# Patient Record
Sex: Female | Born: 1940
Health system: Southern US, Community
[De-identification: ages and names within clinical notes are randomized; demographics above are authoritative.]

## PROBLEM LIST (undated history)

## (undated) DIAGNOSIS — I10 Essential (primary) hypertension: Secondary | ICD-10-CM

## (undated) DIAGNOSIS — D649 Anemia, unspecified: Secondary | ICD-10-CM

## (undated) DIAGNOSIS — C73 Malignant neoplasm of thyroid gland: Secondary | ICD-10-CM

## (undated) DIAGNOSIS — C9000 Multiple myeloma not having achieved remission: Secondary | ICD-10-CM

## (undated) DIAGNOSIS — C801 Malignant (primary) neoplasm, unspecified: Secondary | ICD-10-CM

## (undated) DIAGNOSIS — H409 Unspecified glaucoma: Secondary | ICD-10-CM

## (undated) DIAGNOSIS — E079 Disorder of thyroid, unspecified: Secondary | ICD-10-CM

## (undated) DIAGNOSIS — M949 Disorder of cartilage, unspecified: Secondary | ICD-10-CM

## (undated) DIAGNOSIS — Z5189 Encounter for other specified aftercare: Secondary | ICD-10-CM

## (undated) DIAGNOSIS — H269 Unspecified cataract: Secondary | ICD-10-CM

## (undated) DIAGNOSIS — M899 Disorder of bone, unspecified: Secondary | ICD-10-CM

## (undated) DIAGNOSIS — R7303 Prediabetes: Secondary | ICD-10-CM

## (undated) HISTORY — PX: EYE SURGERY: SHX253

## (undated) HISTORY — DX: Disorder of thyroid, unspecified: E07.9

## (undated) HISTORY — PX: BONE MARROW BIOPSY: SHX199

## (undated) HISTORY — DX: Essential (primary) hypertension: I10

## (undated) HISTORY — DX: Disorder of cartilage, unspecified: M94.9

## (undated) HISTORY — DX: Malignant neoplasm of thyroid gland: C73

## (undated) HISTORY — DX: Multiple myeloma not having achieved remission: C90.00

## (undated) HISTORY — DX: Unspecified cataract: H26.9

## (undated) HISTORY — DX: Encounter for other specified aftercare: Z51.89

## (undated) HISTORY — PX: TONSILLECTOMY: SUR1361

## (undated) HISTORY — PX: DILATION AND CURETTAGE OF UTERUS: SHX78

## (undated) HISTORY — DX: Disorder of bone, unspecified: M89.9

---

## 1988-10-05 HISTORY — PX: BREAST LUMPECTOMY: SHX2

## 1998-03-11 ENCOUNTER — Ambulatory Visit (HOSPITAL_COMMUNITY): Admission: RE | Admit: 1998-03-11 | Discharge: 1998-03-11 | Payer: Self-pay | Admitting: Obstetrics and Gynecology

## 1998-04-22 ENCOUNTER — Other Ambulatory Visit: Admission: RE | Admit: 1998-04-22 | Discharge: 1998-04-22 | Payer: Self-pay

## 1998-10-05 HISTORY — PX: BREAST EXCISIONAL BIOPSY: SUR124

## 1999-03-19 ENCOUNTER — Ambulatory Visit (HOSPITAL_COMMUNITY): Admission: RE | Admit: 1999-03-19 | Discharge: 1999-03-19 | Payer: Self-pay | Admitting: Obstetrics and Gynecology

## 1999-03-19 ENCOUNTER — Encounter: Payer: Self-pay | Admitting: Obstetrics and Gynecology

## 1999-04-01 ENCOUNTER — Other Ambulatory Visit: Admission: RE | Admit: 1999-04-01 | Discharge: 1999-04-01 | Payer: Self-pay | Admitting: Obstetrics and Gynecology

## 1999-05-30 ENCOUNTER — Other Ambulatory Visit: Admission: RE | Admit: 1999-05-30 | Discharge: 1999-05-30 | Payer: Self-pay

## 2000-04-12 ENCOUNTER — Ambulatory Visit (HOSPITAL_COMMUNITY): Admission: RE | Admit: 2000-04-12 | Discharge: 2000-04-12 | Payer: Self-pay | Admitting: Obstetrics and Gynecology

## 2000-04-12 ENCOUNTER — Encounter: Payer: Self-pay | Admitting: Obstetrics and Gynecology

## 2001-04-13 ENCOUNTER — Ambulatory Visit (HOSPITAL_COMMUNITY): Admission: RE | Admit: 2001-04-13 | Discharge: 2001-04-13 | Payer: Self-pay | Admitting: Obstetrics and Gynecology

## 2001-04-13 ENCOUNTER — Encounter: Payer: Self-pay | Admitting: Obstetrics and Gynecology

## 2002-04-14 ENCOUNTER — Ambulatory Visit (HOSPITAL_COMMUNITY): Admission: RE | Admit: 2002-04-14 | Discharge: 2002-04-14 | Payer: Self-pay | Admitting: Obstetrics and Gynecology

## 2002-04-14 ENCOUNTER — Encounter: Payer: Self-pay | Admitting: Obstetrics and Gynecology

## 2002-05-09 ENCOUNTER — Other Ambulatory Visit: Admission: RE | Admit: 2002-05-09 | Discharge: 2002-05-09 | Payer: Self-pay | Admitting: Obstetrics and Gynecology

## 2003-01-04 ENCOUNTER — Encounter: Payer: Self-pay | Admitting: Internal Medicine

## 2003-05-23 ENCOUNTER — Ambulatory Visit (HOSPITAL_COMMUNITY): Admission: RE | Admit: 2003-05-23 | Discharge: 2003-05-23 | Payer: Self-pay | Admitting: Obstetrics and Gynecology

## 2003-05-23 ENCOUNTER — Encounter: Payer: Self-pay | Admitting: Obstetrics and Gynecology

## 2003-11-27 ENCOUNTER — Emergency Department (HOSPITAL_COMMUNITY): Admission: EM | Admit: 2003-11-27 | Discharge: 2003-11-27 | Payer: Self-pay | Admitting: *Deleted

## 2003-12-26 ENCOUNTER — Encounter: Payer: Self-pay | Admitting: Internal Medicine

## 2004-01-01 ENCOUNTER — Encounter: Payer: Self-pay | Admitting: Internal Medicine

## 2004-03-28 ENCOUNTER — Encounter: Payer: Self-pay | Admitting: Internal Medicine

## 2004-05-16 ENCOUNTER — Encounter: Payer: Self-pay | Admitting: Internal Medicine

## 2004-05-21 ENCOUNTER — Other Ambulatory Visit: Admission: RE | Admit: 2004-05-21 | Discharge: 2004-05-21 | Payer: Self-pay | Admitting: Obstetrics and Gynecology

## 2004-05-29 ENCOUNTER — Ambulatory Visit (HOSPITAL_COMMUNITY): Admission: RE | Admit: 2004-05-29 | Discharge: 2004-05-29 | Payer: Self-pay | Admitting: Obstetrics and Gynecology

## 2004-09-02 ENCOUNTER — Ambulatory Visit: Payer: Self-pay | Admitting: Internal Medicine

## 2004-11-26 ENCOUNTER — Ambulatory Visit: Payer: Self-pay | Admitting: Internal Medicine

## 2004-12-02 ENCOUNTER — Ambulatory Visit: Payer: Self-pay | Admitting: Internal Medicine

## 2005-01-12 ENCOUNTER — Encounter: Admission: RE | Admit: 2005-01-12 | Discharge: 2005-01-12 | Payer: Self-pay | Admitting: Obstetrics and Gynecology

## 2006-03-04 ENCOUNTER — Ambulatory Visit (HOSPITAL_COMMUNITY): Admission: RE | Admit: 2006-03-04 | Discharge: 2006-03-04 | Payer: Self-pay | Admitting: Obstetrics and Gynecology

## 2006-03-30 ENCOUNTER — Ambulatory Visit: Payer: Self-pay | Admitting: Internal Medicine

## 2006-04-06 ENCOUNTER — Ambulatory Visit: Payer: Self-pay | Admitting: Internal Medicine

## 2006-05-04 ENCOUNTER — Ambulatory Visit: Payer: Self-pay | Admitting: Internal Medicine

## 2006-06-08 ENCOUNTER — Other Ambulatory Visit: Admission: RE | Admit: 2006-06-08 | Discharge: 2006-06-08 | Payer: Self-pay | Admitting: Obstetrics and Gynecology

## 2006-06-17 ENCOUNTER — Ambulatory Visit: Payer: Self-pay | Admitting: Internal Medicine

## 2007-03-11 DIAGNOSIS — M810 Age-related osteoporosis without current pathological fracture: Secondary | ICD-10-CM | POA: Insufficient documentation

## 2007-03-11 DIAGNOSIS — I10 Essential (primary) hypertension: Secondary | ICD-10-CM

## 2007-03-11 DIAGNOSIS — M899 Disorder of bone, unspecified: Secondary | ICD-10-CM

## 2007-03-11 DIAGNOSIS — M8580 Other specified disorders of bone density and structure, unspecified site: Secondary | ICD-10-CM

## 2007-03-11 HISTORY — DX: Disorder of bone, unspecified: M89.9

## 2007-03-11 HISTORY — DX: Essential (primary) hypertension: I10

## 2007-05-20 ENCOUNTER — Ambulatory Visit (HOSPITAL_COMMUNITY): Admission: RE | Admit: 2007-05-20 | Discharge: 2007-05-20 | Payer: Self-pay | Admitting: Obstetrics and Gynecology

## 2007-06-06 LAB — CONVERTED CEMR LAB: Pap Smear: NORMAL

## 2007-08-05 ENCOUNTER — Ambulatory Visit: Payer: Self-pay | Admitting: Internal Medicine

## 2007-08-05 LAB — CONVERTED CEMR LAB
ALT: 15 U/L
AST: 16 U/L
Albumin: 4.2 g/dL
Alkaline Phosphatase: 30 U/L — ABNORMAL LOW
Basophils Absolute: 0 K/uL
Basophils Relative: 0.7 %
Bilirubin Urine: NEGATIVE
Bilirubin, Direct: 0.2 mg/dL
Blood in Urine, dipstick: NEGATIVE
Cholesterol: 199 mg/dL
Eosinophils Absolute: 0 K/uL
Eosinophils Relative: 1.1 %
Glucose, Urine, Semiquant: NEGATIVE
HCT: 38.3 %
HDL: 47.4 mg/dL
Hemoglobin: 13 g/dL
Ketones, urine, test strip: NEGATIVE
LDL Cholesterol: 131 mg/dL — ABNORMAL HIGH
Lymphocytes Relative: 29.8 %
MCHC: 34 g/dL
MCV: 95.2 fL
Monocytes Absolute: 0.3 K/uL
Monocytes Relative: 6.8 %
Neutro Abs: 2.5 K/uL
Neutrophils Relative %: 61.6 %
Nitrite: NEGATIVE
Platelets: 262 K/uL
Protein, U semiquant: NEGATIVE
RBC: 4.03 M/uL
RDW: 13 %
Specific Gravity, Urine: 1.015
TSH: 1.34 u[IU]/mL
Total Bilirubin: 0.6 mg/dL
Total CHOL/HDL Ratio: 4.2
Total Protein: 7.5 g/dL
Triglycerides: 104 mg/dL
Urobilinogen, UA: 0.2
VLDL: 21 mg/dL
WBC Urine, dipstick: NEGATIVE
WBC: 4 10*3/microliter — ABNORMAL LOW
pH: 7.5

## 2007-08-08 ENCOUNTER — Telehealth (INDEPENDENT_AMBULATORY_CARE_PROVIDER_SITE_OTHER): Payer: Self-pay | Admitting: *Deleted

## 2007-08-16 ENCOUNTER — Ambulatory Visit: Payer: Self-pay | Admitting: Internal Medicine

## 2007-08-16 ENCOUNTER — Encounter: Payer: Self-pay | Admitting: Internal Medicine

## 2007-08-22 ENCOUNTER — Encounter: Payer: Self-pay | Admitting: Internal Medicine

## 2007-09-11 ENCOUNTER — Encounter: Payer: Self-pay | Admitting: Internal Medicine

## 2007-12-14 ENCOUNTER — Telehealth: Payer: Self-pay | Admitting: Internal Medicine

## 2007-12-15 ENCOUNTER — Ambulatory Visit: Payer: Self-pay | Admitting: Internal Medicine

## 2007-12-20 ENCOUNTER — Encounter: Payer: Self-pay | Admitting: Internal Medicine

## 2007-12-20 ENCOUNTER — Ambulatory Visit: Payer: Self-pay

## 2008-05-06 LAB — HM COLONOSCOPY

## 2008-05-30 ENCOUNTER — Ambulatory Visit (HOSPITAL_COMMUNITY): Admission: RE | Admit: 2008-05-30 | Discharge: 2008-05-30 | Payer: Self-pay | Admitting: Obstetrics and Gynecology

## 2008-06-19 ENCOUNTER — Other Ambulatory Visit: Admission: RE | Admit: 2008-06-19 | Discharge: 2008-06-19 | Payer: Self-pay | Admitting: Obstetrics and Gynecology

## 2008-07-14 ENCOUNTER — Encounter: Payer: Self-pay | Admitting: Internal Medicine

## 2008-07-17 ENCOUNTER — Ambulatory Visit: Payer: Self-pay | Admitting: Internal Medicine

## 2008-07-24 ENCOUNTER — Ambulatory Visit: Payer: Self-pay | Admitting: Internal Medicine

## 2008-08-07 ENCOUNTER — Encounter: Payer: Self-pay | Admitting: Internal Medicine

## 2008-08-07 ENCOUNTER — Ambulatory Visit: Payer: Self-pay | Admitting: Internal Medicine

## 2008-08-09 ENCOUNTER — Encounter: Payer: Self-pay | Admitting: Internal Medicine

## 2008-09-05 ENCOUNTER — Ambulatory Visit: Payer: Self-pay | Admitting: Internal Medicine

## 2008-09-05 LAB — CONVERTED CEMR LAB
ALT: 14 units/L (ref 0–35)
AST: 18 units/L (ref 0–37)
Basophils Absolute: 0 10*3/uL (ref 0.0–0.1)
Basophils Relative: 0.7 % (ref 0.0–3.0)
Bilirubin, Direct: 0.1 mg/dL (ref 0.0–0.3)
CO2: 31 meq/L (ref 19–32)
Creatinine, Ser: 0.8 mg/dL (ref 0.4–1.2)
Eosinophils Absolute: 0.1 10*3/uL (ref 0.0–0.7)
HDL: 59.7 mg/dL (ref >39.0)
Hemoglobin: 12.8 g/dL (ref 12.0–15.0)
Ketones, urine, test strip: NEGATIVE
Lymphocytes Relative: 39 % (ref 12.0–46.0)
MCV: 95.3 fL (ref 78.0–100.0)
Monocytes Relative: 10.6 % (ref 3.0–12.0)
Neutro Abs: 1.5 10*3/uL (ref 1.4–7.7)
Neutrophils Relative %: 48 % (ref 43.0–77.0)
Nitrite: NEGATIVE
Platelets: 187 10*3/uL (ref 150–400)
Protein, U semiquant: NEGATIVE
RBC: 3.89 M/uL (ref 3.87–5.11)
Sodium: 142 meq/L (ref 135–145)
Specific Gravity, Urine: 1.015
Total Protein: 7.5 g/dL (ref 6.0–8.3)
Urobilinogen, UA: 0.2
WBC Urine, dipstick: NEGATIVE
WBC: 3.1 10*3/uL — ABNORMAL LOW (ref 4.5–10.5)
pH: 8

## 2008-09-19 ENCOUNTER — Ambulatory Visit: Payer: Self-pay | Admitting: Internal Medicine

## 2008-09-26 ENCOUNTER — Ambulatory Visit: Payer: Self-pay | Admitting: Internal Medicine

## 2008-12-19 ENCOUNTER — Ambulatory Visit: Payer: Self-pay | Admitting: Internal Medicine

## 2008-12-24 ENCOUNTER — Ambulatory Visit: Payer: Self-pay | Admitting: Internal Medicine

## 2009-04-18 ENCOUNTER — Telehealth: Payer: Self-pay | Admitting: Internal Medicine

## 2009-05-15 ENCOUNTER — Telehealth: Payer: Self-pay | Admitting: Internal Medicine

## 2009-06-04 ENCOUNTER — Ambulatory Visit (HOSPITAL_COMMUNITY): Admission: RE | Admit: 2009-06-04 | Discharge: 2009-06-04 | Payer: Self-pay | Admitting: Obstetrics and Gynecology

## 2009-06-28 ENCOUNTER — Ambulatory Visit: Payer: Self-pay | Admitting: Internal Medicine

## 2009-07-12 ENCOUNTER — Telehealth: Payer: Self-pay | Admitting: Internal Medicine

## 2009-08-09 ENCOUNTER — Ambulatory Visit: Payer: Self-pay | Admitting: Internal Medicine

## 2010-02-03 ENCOUNTER — Ambulatory Visit: Payer: Self-pay | Admitting: Internal Medicine

## 2010-02-03 LAB — CONVERTED CEMR LAB
AST: 16 units/L (ref 0–37)
Basophils Relative: 1 % (ref 0.0–3.0)
Bilirubin, Direct: 0 mg/dL (ref 0.0–0.3)
Blood in Urine, dipstick: NEGATIVE
CO2: 32 meq/L (ref 19–32)
Chloride: 105 meq/L (ref 96–112)
Cholesterol: 192 mg/dL (ref 0–200)
Creatinine, Ser: 0.7 mg/dL (ref 0.4–1.2)
Eosinophils Relative: 1.8 % (ref 0.0–5.0)
Glucose, Urine, Semiquant: NEGATIVE
HCT: 37.4 % (ref 36.0–46.0)
HDL: 58.4 mg/dL (ref >39.00)
LDL Cholesterol: 115 mg/dL — ABNORMAL HIGH (ref 0–99)
Lymphs Abs: 1.1 10*3/uL (ref 0.7–4.0)
MCHC: 34 g/dL (ref 30.0–36.0)
Monocytes Absolute: 0.2 10*3/uL (ref 0.1–1.0)
Monocytes Relative: 8.2 % (ref 3.0–12.0)
Neutrophils Relative %: 53.2 % (ref 43.0–77.0)
Potassium: 4.1 meq/L (ref 3.5–5.1)
Protein, U semiquant: NEGATIVE
RBC: 3.89 M/uL (ref 3.87–5.11)
RDW: 14.4 % (ref 11.5–14.6)
Sodium: 142 meq/L (ref 135–145)
Specific Gravity, Urine: 1.015
TSH: 1.68 microintl units/mL (ref 0.35–5.50)
Total CHOL/HDL Ratio: 3
Triglycerides: 91 mg/dL (ref 0.0–149.0)
pH: 7

## 2010-02-10 ENCOUNTER — Ambulatory Visit: Payer: Self-pay | Admitting: Internal Medicine

## 2010-02-10 DIAGNOSIS — M79609 Pain in unspecified limb: Secondary | ICD-10-CM | POA: Insufficient documentation

## 2010-02-21 ENCOUNTER — Ambulatory Visit: Payer: Self-pay | Admitting: Internal Medicine

## 2010-02-21 LAB — CONVERTED CEMR LAB
OCCULT 1: NEGATIVE
OCCULT 2: NEGATIVE
OCCULT 3: NEGATIVE

## 2010-06-25 ENCOUNTER — Ambulatory Visit: Payer: Self-pay | Admitting: Internal Medicine

## 2010-07-09 ENCOUNTER — Ambulatory Visit (HOSPITAL_COMMUNITY): Admission: RE | Admit: 2010-07-09 | Discharge: 2010-07-09 | Payer: Self-pay | Admitting: Internal Medicine

## 2010-11-04 NOTE — Assessment & Plan Note (Signed)
Summary: cpx/njr/PT RESCD//CCM   Vital Signs:  Patient profile:   70 year old female Menstrual status:  postmenopausal Height:      64 inches Weight:      128 pounds BMI:     22.05 Pulse rate:   80 / minute Pulse rhythm:   regular Resp:     12 per minute BP sitting:   158 / 86  (left arm) Cuff size:   regular  Vitals Entered By: Gladis Riffle, RN (Feb 10, 2010 8:08 AM) CC: annual review, labs done--c/o leg cramps Is Patient Diabetic? No Comments BP 122/76-140/85 at home     Menstrual Status postmenopausal Last PAP Result Normal   CC:  annual review and labs done--c/o leg cramps.  History of Present Illness: CPX  new complaints:  glaucoma---followed by ophthalmology  GYN---her GYN retired  Leg cramps---3 weeks of intermittent nocturnal leg cramps---generally mild (she wonders whether could be related to eye drops)  concerned with skin lesion close to nose  Has constipation---ongoing for years without change---has had colonoscopy  pain bottom of right heel---better with inserts  All other systems reviewed and were negative   Preventive Screening-Counseling & Management  Alcohol-Tobacco     Smoking Status: quit > 6 months     Year Started: 1963     Year Quit: 1970  Current Problems (verified): 1)  Preventive Health Care  (ICD-V70.0) 2)  Osteopenia  (ICD-733.90) 3)  Hypertension  (ICD-401.9)  Current Medications (verified): 1)  Vitamin D 1000 Unit Tabs (Cholecalciferol) .... Once Daily 2)  Multivitamins   Tabs (Multiple Vitamin) .... Qd 3)  Fish Oil 1000 Mg Caps (Omega-3 Fatty Acids) .... Once Daily 4)  Grape Seed Extract 100 Mg Caps (Grape Seed) .... Once Daily 5)  Co Q-10 30 Mg Caps (Coenzyme Q10) .... Once Daily 6)  Amlodipine Besylate 5 Mg  Tabs (Amlodipine Besylate) .Marland Kitchen.. 1 By Mouth Every Day 7)  Lumigan 0.03 % Soln (Bimatoprost) .... Use Once Daily As Directed  Allergies: 1)  ! Ace Inhibitors 2)  Penicillin V Potassium (Penicillin V Potassium) 3)   Sulfamethoxazole (Sulfamethoxazole)  Past History:  Past Surgical History: Last updated: 03/11/2007 D & C X3 lip hematoma  Family History: Last updated: August 26, 2007 father deceased alcoholism mother deceased at 54 yo with CHF  Social History: Last updated: 08/26/07 Married Never Smoked Regular exercise-yes  Risk Factors: Exercise: yes (2007/08/26)  Risk Factors: Smoking Status: quit > 6 months (02/10/2010)  Past Medical History: Hypertension Osteopenia glaucoma  Physical Exam  General:  alert and well-developed.   Head:  normocephalic and atraumatic.   Eyes:  pupils equal and pupils round.   Ears:  R ear normal and L ear normal.   Nose:  nose piercing noted and no external erythema.   Neck:  No deformities, masses, or tenderness noted. Chest Wall:  No deformities, masses, or tenderness noted. Lungs:  Normal respiratory effort, chest expands symmetrically. Lungs are clear to auscultation, no crackles or wheezes. Heart:  normal rate and regular rhythm.   Abdomen:  Bowel sounds positive,abdomen soft and non-tender without masses, organomegaly or hernias noted. Msk:  No deformity or scoliosis noted of thoracic or lumbar spine.   Neurologic:  cranial nerves II-XII intact and gait normal.   Skin:  turgor normal and color normal.   Psych:  good eye contact and not anxious appearing.     Impression & Recommendations:  Problem # 1:  PREVENTIVE HEALTH CARE (ICD-V70.0)  health maint UTD  Orders: Hemoccult  Cards MCR Screening (G0107)  Problem # 2:  HYPERTENSION (ICD-401.9) home bps are good continue current medications  Her updated medication list for this problem includes:    Amlodipine Besylate 5 Mg Tabs (Amlodipine besylate) .Marland Kitchen... 1 by mouth every day  Problem # 3:  HEEL PAIN (ICD-729.5) ice exercise  Complete Medication List: 1)  Vitamin D 1000 Unit Tabs (Cholecalciferol) .... Once daily 2)  Multivitamins Tabs (Multiple vitamin) .... Qd 3)  Fish Oil  1000 Mg Caps (Omega-3 fatty acids) .... Once daily 4)  Grape Seed Extract 100 Mg Caps (Grape seed) .... Once daily 5)  Co Q-10 30 Mg Caps (Coenzyme q10) .... Once daily 6)  Amlodipine Besylate 5 Mg Tabs (Amlodipine besylate) .Marland Kitchen.. 1 by mouth every day 7)  Lumigan 0.03 % Soln (Bimatoprost) .... Use once daily as directed  Other Orders: TD Toxoids IM 7 YR + (07371) Admin 1st Vaccine (06269)   Patient Instructions: 1)  stool cards Prescriptions: AMLODIPINE BESYLATE 5 MG  TABS (AMLODIPINE BESYLATE) 1 by mouth every day  #90 x 3   Entered and Authorized by:   Birdie Sons MD   Signed by:   Birdie Sons MD on 02/10/2010   Method used:   Faxed to ...       [facility] (mail-order)             , [place]         Ph: [phone]       Fax: [phone]   RxID:   [id number]    Immunizations Administered:  Tetanus Vaccine:    Vaccine Type: Td    Site: left deltoid    Mfr: Sanofi Pasteur    Dose: 0.5 ml    Route: IM    Given by: Gladis Riffle, RN    Exp. Date: 08/20/2011    Lot #: [id number]   Prevention & Chronic Care Immunizations   Influenza vaccine: Fluvax 3+  (06/28/2009)   Influenza vaccine due: 07/17/2009    Tetanus booster: 02/10/2010: Td    Pneumococcal vaccine: Pneumovax  (04/04/2006)   Pneumococcal vaccine due: None    H. zoster vaccine: 09/26/2008: Zostavax  Colorectal Screening   Hemoccult: Not documented   Hemoccult action/deferral: Not indicated  (02/10/2010)    Colonoscopy: Normal  (05/16/2004)   Colonoscopy due: 05/16/2014  Other Screening   Pap smear: Normal  (06/06/2007)   Pap smear action/deferral: Not indicated-other  (02/10/2010)   Pap smear due: 06/05/2008    Mammogram: ASSESSMENT: Negative - BI-RADS 1^MM DIGITAL SCREENING  (06/04/2009)   Mammogram due: 05/05/2008    DXA bone density scan: abnormal  (08/16/2007)   DXA scan due: 08/2009    Smoking status: quit > 6 months  (02/10/2010)  Lipids   Total Cholesterol: 192  (02/03/2010)    LDL: 115  (02/03/2010)   LDL Direct: Not documented   HDL: 58.40  (02/03/2010)   Triglycerides: 91.0  (02/03/2010)  Hypertension   Last Blood Pressure: 158 / 86  (02/10/2010)   Serum creatinine: 0.7  (02/03/2010)   Serum potassium 4.1  (02/03/2010)    Hypertension flowsheet reviewed?: Yes   Progress toward BP goal: At goal   Hypertension comments: home BPs average 122/65---pt at goal  Self-Management Support :    Hypertension self-management support: Not documented

## 2010-11-04 NOTE — Procedures (Signed)
Summary: Colonoscopy Report  Colonoscopy Report   Imported By: Marylou Mccoy 02/10/2010 09:07:27  _____________________________________________________________________  External Attachment:    Type:   Image     Comment:   External Document

## 2010-11-04 NOTE — Assessment & Plan Note (Signed)
Summary: flu shot/cjr   Nurse Visit   Allergies: 1)  ! Ace Inhibitors 2)  Penicillin V Potassium (Penicillin V Potassium) 3)  Sulfamethoxazole (Sulfamethoxazole)  Orders Added: 1)  Admin 1st Vaccine [90471] 2)  Flu Vaccine 37yrs + [27062] Flu Vaccine Consent Questions     Do you have a history of severe allergic reactions to this vaccine? no    Any prior history of allergic reactions to egg and/or gelatin? no    Do you have a sensitivity to the preservative Thimersol? no    Do you have a past history of Guillan-Barre Syndrome? no    Do you currently have an acute febrile illness? no    Have you ever had a severe reaction to latex? no    Vaccine information given and explained to patient? yes    Are you currently pregnant? no    Lot Number:[id number]   Exp Date:04/04/2011   Site Given  Left Deltoid IM .lbflu

## 2011-01-05 ENCOUNTER — Encounter: Payer: Self-pay | Admitting: Family Medicine

## 2011-01-05 ENCOUNTER — Ambulatory Visit (INDEPENDENT_AMBULATORY_CARE_PROVIDER_SITE_OTHER): Payer: Medicare PPO | Admitting: Family Medicine

## 2011-01-05 DIAGNOSIS — H409 Unspecified glaucoma: Secondary | ICD-10-CM | POA: Insufficient documentation

## 2011-01-05 DIAGNOSIS — R059 Cough, unspecified: Secondary | ICD-10-CM

## 2011-01-05 DIAGNOSIS — R05 Cough: Secondary | ICD-10-CM

## 2011-01-05 DIAGNOSIS — J3489 Other specified disorders of nose and nasal sinuses: Secondary | ICD-10-CM

## 2011-01-05 MED ORDER — HYDROCODONE-HOMATROPINE 5-1.5 MG/5ML PO SYRP: 5.0000 mL | ORAL_SOLUTION | Freq: Four times a day (QID) | ORAL | Status: AC | PRN

## 2011-01-05 NOTE — Progress Notes (Signed)
  Subjective:    Patient ID: Megan Olson, female    DOB: 1941-05-01, 70 y.o.   MRN: [id number]  HPI Patient seen as a work in with symptoms that started about 6 days ago including nasal congestion, dry cough, sore throat, and postnasal drip symptoms. Temperature up to 100 yesterday. Patient is nonsmoker. No nausea, vomiting, or diarrhea. Recent diagnosis of glaucoma and started on timolol drops. Patient has not taken anything for cough. Cough is especially bothersome at night. Very poor sleep secondary to a dry cough. No dyspnea   Review of Systems  Constitutional: Positive for fatigue. Negative for activity change and appetite change.  HENT: Positive for congestion, sore throat, rhinorrhea and postnasal drip. Negative for ear pain and neck pain.   Respiratory: Positive for cough. Negative for shortness of breath.   Cardiovascular: Negative for chest pain, palpitations and leg swelling.       Objective:   Physical Exam  Constitutional: She appears well-developed and well-nourished.  HENT:  Head: Normocephalic.  Right Ear: External ear normal.  Left Ear: External ear normal.  Mouth/Throat: Oropharynx is clear and moist. No oropharyngeal exudate.  Eyes: Pupils are equal, round, and reactive to light. Right eye exhibits no discharge. Left eye exhibits no discharge.  Neck: Neck supple.  Cardiovascular: Normal rate, regular rhythm and normal heart sounds.   Pulmonary/Chest: Effort normal and breath sounds normal. She has no wheezes. She has no rales.  Musculoskeletal: She exhibits no edema.  Lymphadenopathy:    She has no cervical adenopathy.          Assessment & Plan:  Cough and nasal congestion. Differential is allergic versus viral. Try over-the-counter plain Allegra. Hycodan cough syrup as needed for nighttime cough

## 2011-01-05 NOTE — Patient Instructions (Signed)
Follow up promptly for any fever or worsening symptoms 

## 2011-02-17 ENCOUNTER — Other Ambulatory Visit (INDEPENDENT_AMBULATORY_CARE_PROVIDER_SITE_OTHER): Payer: Medicare PPO | Admitting: Internal Medicine

## 2011-02-17 DIAGNOSIS — I10 Essential (primary) hypertension: Secondary | ICD-10-CM

## 2011-02-17 DIAGNOSIS — Z0000 Encounter for general adult medical examination without abnormal findings: Secondary | ICD-10-CM

## 2011-02-17 DIAGNOSIS — Z79899 Other long term (current) drug therapy: Secondary | ICD-10-CM

## 2011-02-17 LAB — LIPID PANEL
HDL: 60.7 mg/dL (ref >39.00)
LDL Cholesterol: 112 mg/dL — ABNORMAL HIGH (ref 0–99)
Total CHOL/HDL Ratio: 3
Triglycerides: 74 mg/dL (ref 0.0–149.0)
VLDL: 14.8 mg/dL (ref 0.0–40.0)

## 2011-02-17 LAB — CBC WITH DIFFERENTIAL/PLATELET
Basophils Absolute: 0 10*3/uL (ref 0.0–0.1)
Eosinophils Relative: 1.4 % (ref 0.0–5.0)
Hemoglobin: 12.2 g/dL (ref 12.0–15.0)
Lymphocytes Relative: 35.4 % (ref 12.0–46.0)
Lymphs Abs: 1.3 10*3/uL (ref 0.7–4.0)
MCV: 95.2 fl (ref 78.0–100.0)
Monocytes Relative: 8.1 % (ref 3.0–12.0)
Neutrophils Relative %: 54.4 % (ref 43.0–77.0)
Platelets: 247 10*3/uL (ref 150.0–400.0)
RDW: 15.3 % — ABNORMAL HIGH (ref 11.5–14.6)

## 2011-02-17 LAB — POCT URINALYSIS DIPSTICK
Bilirubin, UA: NEGATIVE
Glucose, UA: NEGATIVE
Leukocytes, UA: NEGATIVE
Urobilinogen, UA: 0.2
pH, UA: 7.5

## 2011-02-17 LAB — HEPATIC FUNCTION PANEL
Bilirubin, Direct: 0 mg/dL (ref 0.0–0.3)
Total Bilirubin: 0.7 mg/dL (ref 0.3–1.2)
Total Protein: 6.8 g/dL (ref 6.0–8.3)

## 2011-02-17 LAB — BASIC METABOLIC PANEL
BUN: 16 mg/dL (ref 6–23)
Chloride: 104 mEq/L (ref 96–112)
Potassium: 5.3 mEq/L — ABNORMAL HIGH (ref 3.5–5.1)
Sodium: 140 mEq/L (ref 135–145)

## 2011-02-24 ENCOUNTER — Encounter: Payer: Self-pay | Admitting: Internal Medicine

## 2011-02-24 ENCOUNTER — Ambulatory Visit (INDEPENDENT_AMBULATORY_CARE_PROVIDER_SITE_OTHER): Payer: Medicare PPO | Admitting: Internal Medicine

## 2011-02-24 VITALS — BP 150/90 | HR 76 | Temp 98.4°F | Ht 64.0 in | Wt 127.0 lb

## 2011-02-24 DIAGNOSIS — Z0000 Encounter for general adult medical examination without abnormal findings: Secondary | ICD-10-CM

## 2011-02-24 MED ORDER — AMLODIPINE BESYLATE 5 MG PO TABS: 5.0000 mg | ORAL_TABLET | Freq: Every day | ORAL | Status: DC

## 2011-02-24 MED ORDER — BIMATOPROST 0.01 % OP SOLN: 1.0000 [drp] | Freq: Every day | OPHTHALMIC | Status: DC

## 2011-02-24 NOTE — Patient Instructions (Signed)
Schedule DEXA scan

## 2011-02-24 NOTE — Progress Notes (Signed)
  Subjective:    Patient ID: Megan Olson, female    DOB: [birth year], 70 y.o.   MRN: [id number]  HPI  Well visit  l knee pain---she thinks she over-flexed knee 6 months ago---still with pain with full flexion  URI sxs in march---reviewed dr burchette's note  Has questions about seborhheic keratosis.   GYN care---last pap 2-3 years---all previously normal  Glaucoma---she sees ophthalmology--she wants to take a supplement  Past Medical History  Diagnosis Date  . HYPERTENSION 03/11/2007  . OSTEOPENIA 03/11/2007   Past Surgical History  Procedure Date  . Dilation and curettage of uterus     reports that she quit smoking about 50 years ago. Her smoking use included Cigarettes. She has a 3.5 pack-year smoking history. She does not have any smokeless tobacco history on file. Her alcohol and drug histories not on file. family history includes Alcohol abuse in her father; Arthritis in her mother and sister; Cancer in her sister; Glaucoma in her mother; Heart disease in her mother and sister; Hyperlipidemia in her brother; and Hypertension in her brother and sister. Allergies  Allergen Reactions  . Ace Inhibitors   . Penicillins     REACTION: rash  . Sulfamethoxazole     REACTION: rash     Review of Systems  patient denies chest pain, shortness of breath, orthopnea. Denies lower extremity edema, abdominal pain, change in appetite, change in bowel movements. Patient denies rashes, musculoskeletal complaints. No other specific complaints in a complete review of systems.      Objective:   Physical Exam  Well-developed well-nourished female in no acute distress. HEENT exam atraumatic, normocephalic, extraocular muscles are intact. Neck is supple. No jugular venous distention no thyromegaly. Chest clear to auscultation without increased work of breathing. Cardiac exam S1 and S2 are regular. Abdominal exam active bowel sounds, soft, nontender. Extremities no edema. Neurologic exam she is alert  without any motor sensory deficits. Gait is normal. Skin tag---irritated on back     Assessment & Plan:  htn---well controlled at home :110-130/70s  Well vist---health maint UTD

## 2011-02-26 ENCOUNTER — Ambulatory Visit (INDEPENDENT_AMBULATORY_CARE_PROVIDER_SITE_OTHER)
Admission: RE | Admit: 2011-02-26 | Discharge: 2011-02-26 | Disposition: A | Discharge status: Home / Self Care | Payer: Medicare PPO | Source: Ambulatory Visit

## 2011-02-26 ENCOUNTER — Other Ambulatory Visit: Payer: Self-pay | Admitting: Internal Medicine

## 2011-02-26 DIAGNOSIS — M949 Disorder of cartilage, unspecified: Secondary | ICD-10-CM

## 2011-02-26 DIAGNOSIS — M899 Disorder of bone, unspecified: Secondary | ICD-10-CM

## 2011-03-20 ENCOUNTER — Encounter: Payer: Self-pay | Admitting: Internal Medicine

## 2011-03-27 ENCOUNTER — Telehealth: Payer: Self-pay | Admitting: *Deleted

## 2011-03-27 NOTE — Telephone Encounter (Signed)
Pt wanted to know how her bone density compared to the last time and when does it start with osteoporosis and ends with osteopenia

## 2011-03-27 NOTE — Telephone Encounter (Signed)
Pt aware.

## 2011-03-27 NOTE — Telephone Encounter (Signed)
It is okay to give her the T score is off of the bone density test. Osteopenia T score ranges between -1.0 and -2.5. Osteoporosis starts at -2.5 and below.

## 2011-07-15 ENCOUNTER — Other Ambulatory Visit: Payer: Self-pay | Admitting: Internal Medicine

## 2011-07-15 DIAGNOSIS — Z1231 Encounter for screening mammogram for malignant neoplasm of breast: Secondary | ICD-10-CM

## 2011-07-28 ENCOUNTER — Ambulatory Visit (HOSPITAL_COMMUNITY)
Admission: RE | Admit: 2011-07-28 | Discharge: 2011-07-28 | Disposition: A | Discharge status: Home / Self Care | Payer: Medicare PPO | Source: Ambulatory Visit | Attending: Internal Medicine | Admitting: Internal Medicine

## 2011-07-28 DIAGNOSIS — Z1231 Encounter for screening mammogram for malignant neoplasm of breast: Secondary | ICD-10-CM | POA: Insufficient documentation

## 2011-08-19 ENCOUNTER — Ambulatory Visit: Payer: Medicare PPO | Admitting: *Deleted

## 2011-08-20 ENCOUNTER — Ambulatory Visit: Payer: Medicare PPO | Admitting: *Deleted

## 2011-11-20 ENCOUNTER — Telehealth: Payer: Self-pay | Admitting: *Deleted

## 2011-11-20 MED ORDER — AMLODIPINE BESYLATE 5 MG PO TABS: 5.0000 mg | ORAL_TABLET | Freq: Every day | ORAL | Status: DC

## 2011-11-20 NOTE — Telephone Encounter (Signed)
Pt needs a new prescription for Amlodipine 5 mg for 90 days to send to Pt.  She is changing pharmacies to [facility] .   Please call pt when pres is ready to be picked up.

## 2012-01-27 ENCOUNTER — Other Ambulatory Visit: Payer: Self-pay | Admitting: Dermatology

## 2012-03-07 ENCOUNTER — Other Ambulatory Visit (INDEPENDENT_AMBULATORY_CARE_PROVIDER_SITE_OTHER): Payer: Medicare PPO

## 2012-03-07 DIAGNOSIS — Z0000 Encounter for general adult medical examination without abnormal findings: Secondary | ICD-10-CM

## 2012-03-07 DIAGNOSIS — I10 Essential (primary) hypertension: Secondary | ICD-10-CM

## 2012-03-07 LAB — CBC WITH DIFFERENTIAL/PLATELET
Basophils Absolute: 0 10*3/uL (ref 0.0–0.1)
Eosinophils Absolute: 0.1 10*3/uL (ref 0.0–0.7)
Lymphocytes Relative: 36.3 % (ref 12.0–46.0)
MCHC: 33 g/dL (ref 30.0–36.0)
Monocytes Relative: 10.7 % (ref 3.0–12.0)
Platelets: 209 10*3/uL (ref 150.0–400.0)
RDW: 14.4 % (ref 11.5–14.6)

## 2012-03-07 LAB — BASIC METABOLIC PANEL
BUN: 15 mg/dL (ref 6–23)
CO2: 29 mEq/L (ref 19–32)
Calcium: 9.1 mg/dL (ref 8.4–10.5)
Chloride: 102 mEq/L (ref 96–112)
Creatinine, Ser: 0.7 mg/dL (ref 0.4–1.2)
GFR: 82.22 mL/min (ref >60.00)
Glucose, Bld: 93 mg/dL (ref 70–99)
Potassium: 4 mEq/L (ref 3.5–5.1)
Sodium: 139 mEq/L (ref 135–145)

## 2012-03-07 LAB — POCT URINALYSIS DIPSTICK
Bilirubin, UA: NEGATIVE
Blood, UA: NEGATIVE
Ketones, UA: NEGATIVE
Leukocytes, UA: NEGATIVE
Protein, UA: NEGATIVE
pH, UA: 8.5

## 2012-03-07 LAB — LIPID PANEL
HDL: 64.2 mg/dL (ref >39.00)
Total CHOL/HDL Ratio: 3
Triglycerides: 88 mg/dL (ref 0.0–149.0)
VLDL: 17.6 mg/dL (ref 0.0–40.0)

## 2012-03-07 LAB — TSH: TSH: 1.9 u[IU]/mL (ref 0.35–5.50)

## 2012-03-07 LAB — HEPATIC FUNCTION PANEL
AST: 14 U/L (ref 0–37)
Alkaline Phosphatase: 45 U/L (ref 39–117)
Total Bilirubin: 0.7 mg/dL (ref 0.3–1.2)

## 2012-03-14 ENCOUNTER — Ambulatory Visit (INDEPENDENT_AMBULATORY_CARE_PROVIDER_SITE_OTHER): Payer: Medicare Other | Admitting: Internal Medicine

## 2012-03-14 ENCOUNTER — Encounter: Payer: Self-pay | Admitting: Internal Medicine

## 2012-03-14 VITALS — BP 182/88 | HR 82 | Temp 98.3°F | Ht 64.25 in | Wt 126.0 lb

## 2012-03-14 DIAGNOSIS — Z0000 Encounter for general adult medical examination without abnormal findings: Secondary | ICD-10-CM

## 2012-03-14 MED ORDER — AMLODIPINE BESYLATE 5 MG PO TABS: 5.0000 mg | ORAL_TABLET | Freq: Every day | ORAL | Status: DC

## 2012-03-14 NOTE — Progress Notes (Signed)
  cpx  home bps- 117-131/73-81  Has seen dermatology for "keratosis"- pruritis. Pruritis can be diffuse but is intermittent  Past Medical History  Diagnosis Date  . HYPERTENSION 03/11/2007  . OSTEOPENIA 03/11/2007    History   Social History  . Marital Status: Married    Spouse Name: N/A    Number of Children: N/A  . Years of Education: N/A   Occupational History  . Not on file.   Social History Main Topics  . Smoking status: Former Smoker -- 0.5 packs/day for 7 years    Types: Cigarettes    Quit date: 01/04/1961  . Smokeless tobacco: Not on file  . Alcohol Use: Not on file  . Drug Use: Not on file  . Sexually Active: Not on file   Other Topics Concern  . Not on file   Social History Narrative  . No narrative on file    Past Surgical History  Procedure Date  . Dilation and curettage of uterus     Family History  Problem Relation Age of Onset  . Heart disease Mother   . Arthritis Mother   . Glaucoma Mother   . Alcohol abuse Father   . Cancer Sister     lung cancer, smoker  . Heart disease Sister     aortic valve replacement  . Hyperlipidemia Brother   . Hypertension Brother   . Arthritis Sister   . Hypertension Sister     Allergies  Allergen Reactions  . Ace Inhibitors   . Penicillins     REACTION: rash  . Sulfamethoxazole     REACTION: rash    Current Outpatient Prescriptions on File Prior to Visit  Medication Sig Dispense Refill  . amLODipine (NORVASC) 5 MG tablet Take 1 tablet (5 mg total) by mouth daily.  90 tablet  1  . Bimatoprost (LUMIGAN) 0.01 % SOLN Apply 1 drop to eye at bedtime.      . Cholecalciferol (VITAMIN D3) 1000 UNITS CAPS Take by mouth daily.        Marland Kitchen co-enzyme Q-10 50 MG capsule Take 50 mg by mouth daily.        . fish oil-omega-3 fatty acids 1000 MG capsule Take 2 g by mouth daily.        . Magnesium Oxide 500 MG TABS Take 1 tablet by mouth daily.        . Multiple Vitamins-Minerals (MULTIVITAMIN WITH MINERALS) tablet Take 1  tablet by mouth daily.           patient denies chest pain, shortness of breath, orthopnea. Denies lower extremity edema, abdominal pain, change in appetite, change in bowel movements. Patient denies rashes, musculoskeletal complaints. No other specific complaints in a complete review of systems.   BP 182/88  Pulse 82  Temp(Src) 98.3 F (36.8 C) (Oral)  Ht 5' 4.25" (1.632 m)  Wt 126 lb (57.153 kg)  BMI 21.46 kg/m2  Well-developed well-nourished female in no acute distress. HEENT exam atraumatic, normocephalic, extraocular muscles are intact. Neck is supple. No jugular venous distention no thyromegaly. Chest clear to auscultation without increased work of breathing. Cardiac exam S1 and S2 are regular. Abdominal exam active bowel sounds, soft, nontender. Extremities no edema. Neurologic exam she is alert without any motor sensory deficits. Gait is normal.   A/P: well visit- health maint UTD Pruritis-- no rash-- she will try moisturizer.

## 2012-06-18 ENCOUNTER — Encounter (HOSPITAL_COMMUNITY): Payer: Self-pay | Admitting: Emergency Medicine

## 2012-06-18 ENCOUNTER — Emergency Department (HOSPITAL_COMMUNITY)
Admission: EM | Admit: 2012-06-18 | Discharge: 2012-06-18 | Disposition: A | Discharge status: Home / Self Care | Payer: Medicare Other | Source: Home / Self Care | Attending: Family Medicine | Admitting: Family Medicine

## 2012-06-18 DIAGNOSIS — S70361A Insect bite (nonvenomous), right thigh, initial encounter: Secondary | ICD-10-CM

## 2012-06-18 DIAGNOSIS — T148XXA Other injury of unspecified body region, initial encounter: Secondary | ICD-10-CM

## 2012-06-18 DIAGNOSIS — S90569A Insect bite (nonvenomous), unspecified ankle, initial encounter: Secondary | ICD-10-CM

## 2012-06-18 HISTORY — DX: Unspecified glaucoma: H40.9

## 2012-06-18 NOTE — ED Notes (Signed)
Pt c/o poss tick bite??? Noticed what she thought was a skin tag yesterday morning... She scraped it off yesterday and when awoke this am, she noticed a small round bruise... She denies: fevers, vomiting, nausea, diarrhea, pain or itching.

## 2012-06-18 NOTE — ED Provider Notes (Signed)
History     CSN: [id number]  Arrival date & time 06/18/12  1357   First MD Initiated Contact with Patient 06/18/12 1404      Chief Complaint  Patient presents with  . Insect Bite    (Consider location/radiation/quality/duration/timing/severity/associated sxs/prior treatment) Patient is a 71 y.o. female presenting with rash. The history is provided by the patient and the spouse.  Rash  This is a new problem. The current episode started yesterday. The problem has not changed since onset.The problem is associated with an insect bite/sting. There has been no fever. The rash is present on the right upper leg. The patient is experiencing no pain. Pertinent negatives include no itching and no pain.    Past Medical History  Diagnosis Date  . HYPERTENSION 03/11/2007  . OSTEOPENIA 03/11/2007  . Glaucoma     Past Surgical History  Procedure Date  . Dilation and curettage of uterus   . Breast lumpectomy 1990    benign  . Tonsillectomy     age 77    Family History  Problem Relation Age of Onset  . Heart disease Mother   . Arthritis Mother   . Glaucoma Mother   . Alcohol abuse Father   . Cancer Sister     lung cancer, smoker  . Heart disease Sister     aortic valve replacement  . Hyperlipidemia Brother   . Hypertension Brother   . Arthritis Sister   . Hypertension Sister     History  Substance Use Topics  . Smoking status: Former Smoker -- 0.5 packs/day for 7 years    Types: Cigarettes    Quit date: 01/04/1961  . Smokeless tobacco: Not on file  . Alcohol Use: Yes    OB History    Grav Para Term Preterm Abortions TAB SAB Ect Mult Living                  Review of Systems  Constitutional: Negative.   Skin: Positive for rash. Negative for itching.    Allergies  Ace inhibitors; Penicillins; and Sulfamethoxazole  Home Medications   Current Outpatient Rx  Name Route Sig Dispense Refill  . AMLODIPINE BESYLATE 5 MG PO TABS Oral Take 1 tablet (5 mg total) by mouth  daily. 90 tablet 3  . BIMATOPROST 0.01 % OP SOLN Ophthalmic Apply 1 drop to eye at bedtime.    Marland Kitchen VITAMIN D3 1000 UNITS PO CAPS Oral Take by mouth daily.      . OMEGA-3 FATTY ACIDS 1000 MG PO CAPS Oral Take 2 g by mouth daily.      Marland Kitchen CO-ENZYME Q-10 50 MG PO CAPS Oral Take 50 mg by mouth daily.      Marland Kitchen MAGNESIUM OXIDE 500 MG PO TABS Oral Take 1 tablet by mouth daily.      . MULTI-VITAMIN/MINERALS PO TABS Oral Take 1 tablet by mouth daily.        BP 188/83  Pulse 68  Temp 98.4 F (36.9 C) (Oral)  Resp 16  SpO2 98%  Physical Exam  Nursing note and vitals reviewed. Constitutional: She appears well-developed and well-nourished.  Skin: Skin is warm and dry. Rash noted.       ED Course  Procedures (including critical care time)  Labs Reviewed - No data to display No results found.   1. Insect bite of right thigh       MDM          Linna Hoff, MD 06/18/12 1610

## 2012-06-29 ENCOUNTER — Other Ambulatory Visit (HOSPITAL_COMMUNITY): Payer: Self-pay | Admitting: Obstetrics and Gynecology

## 2012-06-29 DIAGNOSIS — Z1231 Encounter for screening mammogram for malignant neoplasm of breast: Secondary | ICD-10-CM

## 2012-07-19 ENCOUNTER — Ambulatory Visit (INDEPENDENT_AMBULATORY_CARE_PROVIDER_SITE_OTHER): Payer: Medicare Other

## 2012-07-19 DIAGNOSIS — Z23 Encounter for immunization: Secondary | ICD-10-CM

## 2012-08-02 ENCOUNTER — Ambulatory Visit (HOSPITAL_COMMUNITY)
Admission: RE | Admit: 2012-08-02 | Discharge: 2012-08-02 | Disposition: A | Discharge status: Home / Self Care | Payer: Medicare Other | Source: Ambulatory Visit | Attending: Obstetrics and Gynecology | Admitting: Obstetrics and Gynecology

## 2012-08-02 DIAGNOSIS — Z1231 Encounter for screening mammogram for malignant neoplasm of breast: Secondary | ICD-10-CM | POA: Insufficient documentation

## 2013-02-13 ENCOUNTER — Telehealth: Payer: Self-pay | Admitting: *Deleted

## 2013-02-13 NOTE — Telephone Encounter (Signed)
Pt passed out yesterday and 911 was called.  EMT checked an EKG and BP and everything was normal.  She decided not to go to the ER.  She has an appt with Dr Cato Mulligan for a CPX on 6/11 and wants to know if she can wait till then or see you sooner?

## 2013-02-13 NOTE — Telephone Encounter (Signed)
Per Dr Cato Mulligan ok to wait till 03/15/13.  Pt aware

## 2013-03-02 ENCOUNTER — Telehealth: Payer: Self-pay | Admitting: Internal Medicine

## 2013-03-02 NOTE — Telephone Encounter (Signed)
Pt had passing out episode and was advised by nurse it was ok to wait until 6/11 appt. This appt needs to be resc on bump, first available cpe is 9/3.  Pt also has  developed "fissures" on tongue, wanted to discuss also. Pls advise.

## 2013-03-03 NOTE — Telephone Encounter (Signed)
Yes, appt made/kh

## 2013-03-03 NOTE — Telephone Encounter (Signed)
See if she can come in on 03/24/13 at 10:15

## 2013-03-08 ENCOUNTER — Other Ambulatory Visit (INDEPENDENT_AMBULATORY_CARE_PROVIDER_SITE_OTHER): Payer: Medicare Other

## 2013-03-08 DIAGNOSIS — Z0000 Encounter for general adult medical examination without abnormal findings: Secondary | ICD-10-CM

## 2013-03-08 DIAGNOSIS — I10 Essential (primary) hypertension: Secondary | ICD-10-CM

## 2013-03-08 LAB — BASIC METABOLIC PANEL WITH GFR
BUN: 14 mg/dL (ref 6–23)
CO2: 29 meq/L (ref 19–32)
Calcium: 9.5 mg/dL (ref 8.4–10.5)
Chloride: 106 meq/L (ref 96–112)
Creatinine, Ser: 0.8 mg/dL (ref 0.4–1.2)
GFR: 74.93 mL/min
Glucose, Bld: 104 mg/dL — ABNORMAL HIGH (ref 70–99)
Potassium: 4.8 meq/L (ref 3.5–5.1)
Sodium: 142 meq/L (ref 135–145)

## 2013-03-08 LAB — TSH: TSH: 1.79 u[IU]/mL (ref 0.35–5.50)

## 2013-03-08 LAB — CBC WITH DIFFERENTIAL/PLATELET
Basophils Absolute: 0 10*3/uL (ref 0.0–0.1)
Basophils Relative: 1.2 % (ref 0.0–3.0)
Eosinophils Absolute: 0.1 10*3/uL (ref 0.0–0.7)
Lymphocytes Relative: 35.9 % (ref 12.0–46.0)
MCHC: 33.1 g/dL (ref 30.0–36.0)
MCV: 96.1 fl (ref 78.0–100.0)
Monocytes Absolute: 0.3 10*3/uL (ref 0.1–1.0)
Neutrophils Relative %: 51.5 % (ref 43.0–77.0)
Platelets: 230 10*3/uL (ref 150.0–400.0)
RBC: 4.08 Mil/uL (ref 3.87–5.11)
RDW: 14.2 % (ref 11.5–14.6)

## 2013-03-08 LAB — LIPID PANEL
HDL: 62.1 mg/dL (ref >39.00)
Total CHOL/HDL Ratio: 3
VLDL: 15 mg/dL (ref 0.0–40.0)

## 2013-03-08 LAB — HEPATIC FUNCTION PANEL
ALT: 15 U/L (ref 0–35)
AST: 17 U/L (ref 0–37)
Total Bilirubin: 0.8 mg/dL (ref 0.3–1.2)
Total Protein: 7.4 g/dL (ref 6.0–8.3)

## 2013-03-08 LAB — POCT URINALYSIS DIPSTICK
Bilirubin, UA: NEGATIVE
Nitrite, UA: NEGATIVE
Protein, UA: NEGATIVE
pH, UA: 8

## 2013-03-15 ENCOUNTER — Encounter: Payer: Medicare Other | Admitting: Internal Medicine

## 2013-03-24 ENCOUNTER — Telehealth: Payer: Self-pay | Admitting: Internal Medicine

## 2013-03-24 ENCOUNTER — Encounter: Payer: Self-pay | Admitting: Internal Medicine

## 2013-03-24 ENCOUNTER — Ambulatory Visit (INDEPENDENT_AMBULATORY_CARE_PROVIDER_SITE_OTHER): Payer: Medicare Other | Admitting: Internal Medicine

## 2013-03-24 VITALS — BP 142/92 | HR 88 | Temp 98.4°F | Ht 64.0 in | Wt 125.0 lb

## 2013-03-24 DIAGNOSIS — M899 Disorder of bone, unspecified: Secondary | ICD-10-CM

## 2013-03-24 DIAGNOSIS — M949 Disorder of cartilage, unspecified: Secondary | ICD-10-CM

## 2013-03-24 DIAGNOSIS — Z0000 Encounter for general adult medical examination without abnormal findings: Secondary | ICD-10-CM

## 2013-03-24 MED ORDER — AMLODIPINE BESYLATE 5 MG PO TABS: 2.5000 mg | ORAL_TABLET | Freq: Every day | ORAL | Status: DC

## 2013-03-24 NOTE — Telephone Encounter (Signed)
[facility] order placed [facility]

## 2013-03-24 NOTE — Progress Notes (Signed)
Patient ID: Megan Olson, female   DOB: November 03, 1940, 72 y.o.   MRN: [id number]  cpx  Had one syncopal spell- vagal in nature  Reviewed pmh, psh, soc hx  Reviewed meds   patient denies chest pain, shortness of breath, orthopnea. Denies lower extremity edema, abdominal pain, change in appetite, change in bowel movements. Patient denies rashes, musculoskeletal complaints. No other specific complaints in a complete review of systems.    Well-developed well-nourished female in no acute distress. HEENT exam atraumatic, normocephalic, extraocular muscles are intact. Neck is supple. No jugular venous distention no thyromegaly. Chest clear to auscultation without increased work of breathing. Cardiac exam S1 and S2 are regular. Abdominal exam active bowel sounds, soft, nontender. Extremities no edema. Neurologic exam she is alert without any motor sensory deficits. Gait is normal.  Well visit- health maint utd

## 2013-03-24 NOTE — Telephone Encounter (Signed)
PT [facility] same place she has her mammogram done. Please place the order, and Joni Reining will scheudle.

## 2013-04-03 ENCOUNTER — Encounter: Payer: Self-pay | Admitting: Internal Medicine

## 2013-04-11 ENCOUNTER — Other Ambulatory Visit: Payer: Medicare Other

## 2013-04-13 ENCOUNTER — Ambulatory Visit (INDEPENDENT_AMBULATORY_CARE_PROVIDER_SITE_OTHER)
Admission: RE | Admit: 2013-04-13 | Discharge: 2013-04-13 | Disposition: A | Discharge status: Home / Self Care | Payer: Medicare Other | Source: Ambulatory Visit | Attending: Internal Medicine | Admitting: Internal Medicine

## 2013-04-13 DIAGNOSIS — M899 Disorder of bone, unspecified: Secondary | ICD-10-CM

## 2013-04-13 DIAGNOSIS — M949 Disorder of cartilage, unspecified: Secondary | ICD-10-CM

## 2013-05-20 ENCOUNTER — Encounter: Payer: Self-pay | Admitting: Internal Medicine

## 2013-06-27 ENCOUNTER — Ambulatory Visit (INDEPENDENT_AMBULATORY_CARE_PROVIDER_SITE_OTHER): Payer: Medicare Other

## 2013-06-27 DIAGNOSIS — Z23 Encounter for immunization: Secondary | ICD-10-CM

## 2013-07-17 ENCOUNTER — Other Ambulatory Visit (HOSPITAL_COMMUNITY): Payer: Self-pay | Admitting: Obstetrics and Gynecology

## 2013-07-17 DIAGNOSIS — Z1231 Encounter for screening mammogram for malignant neoplasm of breast: Secondary | ICD-10-CM

## 2013-08-04 ENCOUNTER — Ambulatory Visit (HOSPITAL_COMMUNITY)
Admission: RE | Admit: 2013-08-04 | Discharge: 2013-08-04 | Disposition: A | Discharge status: Home / Self Care | Payer: Medicare Other | Source: Ambulatory Visit | Attending: Obstetrics and Gynecology | Admitting: Obstetrics and Gynecology

## 2013-08-04 DIAGNOSIS — Z1231 Encounter for screening mammogram for malignant neoplasm of breast: Secondary | ICD-10-CM | POA: Insufficient documentation

## 2013-08-15 ENCOUNTER — Other Ambulatory Visit: Payer: Self-pay | Admitting: *Deleted

## 2013-08-15 MED ORDER — AMLODIPINE BESYLATE 5 MG PO TABS: 2.5000 mg | ORAL_TABLET | Freq: Every day | ORAL | Status: DC

## 2013-12-06 ENCOUNTER — Encounter: Payer: Self-pay | Admitting: Internal Medicine

## 2014-03-19 ENCOUNTER — Other Ambulatory Visit (INDEPENDENT_AMBULATORY_CARE_PROVIDER_SITE_OTHER): Payer: Medicare HMO

## 2014-03-19 DIAGNOSIS — Z0000 Encounter for general adult medical examination without abnormal findings: Secondary | ICD-10-CM

## 2014-03-19 DIAGNOSIS — I10 Essential (primary) hypertension: Secondary | ICD-10-CM

## 2014-03-19 LAB — POCT URINALYSIS DIPSTICK
Bilirubin, UA: NEGATIVE
Blood, UA: NEGATIVE
Glucose, UA: NEGATIVE
Ketones, UA: NEGATIVE
Leukocytes, UA: NEGATIVE
NITRITE UA: NEGATIVE
PH UA: 8
PROTEIN UA: NEGATIVE
Spec Grav, UA: 1.015
Urobilinogen, UA: 0.2

## 2014-03-19 LAB — CBC WITH DIFFERENTIAL/PLATELET
Basophils Absolute: 0 10*3/uL (ref 0.0–0.1)
Basophils Relative: 0.7 % (ref 0.0–3.0)
EOS PCT: 1.8 % (ref 0.0–5.0)
Eosinophils Absolute: 0.1 10*3/uL (ref 0.0–0.7)
HEMATOCRIT: 40.4 % (ref 36.0–46.0)
Hemoglobin: 13.4 g/dL (ref 12.0–15.0)
LYMPHS ABS: 1.1 10*3/uL (ref 0.7–4.0)
Lymphocytes Relative: 32.2 % (ref 12.0–46.0)
MCHC: 33 g/dL (ref 30.0–36.0)
MCV: 95.9 fl (ref 78.0–100.0)
Monocytes Absolute: 0.3 10*3/uL (ref 0.1–1.0)
Monocytes Relative: 7.8 % (ref 3.0–12.0)
Neutro Abs: 2 10*3/uL (ref 1.4–7.7)
Neutrophils Relative %: 57.5 % (ref 43.0–77.0)
Platelets: 235 10*3/uL (ref 150.0–400.0)
RBC: 4.22 Mil/uL (ref 3.87–5.11)
RDW: 14 % (ref 11.5–15.5)
WBC: 3.5 10*3/uL — AB (ref 4.0–10.5)

## 2014-03-19 LAB — BASIC METABOLIC PANEL
BUN: 15 mg/dL (ref 6–23)
CHLORIDE: 105 meq/L (ref 96–112)
CO2: 30 mEq/L (ref 19–32)
Calcium: 9.6 mg/dL (ref 8.4–10.5)
Creatinine, Ser: 0.8 mg/dL (ref 0.4–1.2)
GFR: 71.61 mL/min (ref >60.00)
Glucose, Bld: 127 mg/dL — ABNORMAL HIGH (ref 70–99)
POTASSIUM: 5.4 meq/L — AB (ref 3.5–5.1)
Sodium: 140 mEq/L (ref 135–145)

## 2014-03-19 LAB — LIPID PANEL
Cholesterol: 214 mg/dL — ABNORMAL HIGH (ref 0–200)
HDL: 68.2 mg/dL (ref >39.00)
LDL Cholesterol: 121 mg/dL — ABNORMAL HIGH (ref 0–99)
NONHDL: 145.8
Total CHOL/HDL Ratio: 3
Triglycerides: 122 mg/dL (ref 0.0–149.0)
VLDL: 24.4 mg/dL (ref 0.0–40.0)

## 2014-03-19 LAB — TSH: TSH: 1.58 u[IU]/mL (ref 0.35–4.50)

## 2014-03-19 LAB — HEPATIC FUNCTION PANEL
ALBUMIN: 4.4 g/dL (ref 3.5–5.2)
ALT: 11 U/L (ref 0–35)
AST: 16 U/L (ref 0–37)
Alkaline Phosphatase: 37 U/L — ABNORMAL LOW (ref 39–117)
Bilirubin, Direct: 0.1 mg/dL (ref 0.0–0.3)
TOTAL PROTEIN: 7.1 g/dL (ref 6.0–8.3)
Total Bilirubin: 0.7 mg/dL (ref 0.2–1.2)

## 2014-03-26 ENCOUNTER — Encounter: Payer: Self-pay | Admitting: Internal Medicine

## 2014-03-26 ENCOUNTER — Ambulatory Visit (INDEPENDENT_AMBULATORY_CARE_PROVIDER_SITE_OTHER): Payer: Medicare HMO | Admitting: Internal Medicine

## 2014-03-26 VITALS — BP 130/70 | HR 92 | Temp 98.6°F | Ht 64.25 in | Wt 123.0 lb

## 2014-03-26 DIAGNOSIS — Z0000 Encounter for general adult medical examination without abnormal findings: Secondary | ICD-10-CM

## 2014-03-26 NOTE — Progress Notes (Signed)
Pre visit review using our clinic review tool, if applicable. No additional management support is needed unless otherwise documented below in the visit note. 

## 2014-03-26 NOTE — Progress Notes (Signed)
cpx  Home bps- 120-140/70s  Past Medical History  Diagnosis Date  . HYPERTENSION 03/11/2007  . OSTEOPENIA 03/11/2007  . Glaucoma     History   Social History  . Marital Status: Married    Spouse Name: N/A    Number of Children: N/A  . Years of Education: N/A   Occupational History  . Not on file.   Social History Main Topics  . Smoking status: Former Smoker -- 0.50 packs/day for 7 years    Types: Cigarettes    Quit date: 01/04/1961  . Smokeless tobacco: Not on file  . Alcohol Use: Yes  . Drug Use: Not on file  . Sexual Activity: Not on file   Other Topics Concern  . Not on file   Social History Narrative  . No narrative on file    Past Surgical History  Procedure Laterality Date  . Dilation and curettage of uterus    . Breast lumpectomy  1990    benign  . Tonsillectomy      age 20    Family History  Problem Relation Age of Onset  . Heart disease Mother   . Arthritis Mother   . Glaucoma Mother   . Alcohol abuse Father   . Cancer Sister     lung cancer, smoker  . Heart disease Sister     aortic valve replacement  . Hyperlipidemia Brother   . Hypertension Brother   . Arthritis Sister   . Hypertension Sister     Allergies  Allergen Reactions  . Ace Inhibitors   . Penicillins     REACTION: rash  . Sulfamethoxazole     REACTION: rash    Current Outpatient Prescriptions on File Prior to Visit  Medication Sig Dispense Refill  . amLODipine (NORVASC) 5 MG tablet Take 0.5 tablets (2.5 mg total) by mouth daily.  45 tablet  3  . Bimatoprost (LUMIGAN) 0.01 % SOLN Apply 1 drop to eye at bedtime.      . Cholecalciferol (VITAMIN D3) 1000 UNITS CAPS Take by mouth daily.        Marland Kitchen co-enzyme Q-10 50 MG capsule Take 50 mg by mouth daily.        . fish oil-omega-3 fatty acids 1000 MG capsule Take 2 g by mouth daily.        . Magnesium Oxide 500 MG TABS Take 1 tablet by mouth daily.        . Multiple Vitamins-Minerals (MULTIVITAMIN WITH MINERALS) tablet Take 1  tablet by mouth daily.        Marland Kitchen Resveratrol 50 MG CAPS Take 1 capsule by mouth daily.       No current facility-administered medications on file prior to visit.     patient denies chest pain, shortness of breath, orthopnea. Denies lower extremity edema, abdominal pain, change in appetite, change in bowel movements. Patient denies rashes, musculoskeletal complaints. No other specific complaints in a complete review of systems.   Reviewed vitals  Well-developed well-nourished female in no acute distress. HEENT exam atraumatic, normocephalic, extraocular muscles are intact. Neck is supple. No jugular venous distention no thyromegaly. Chest clear to auscultation without increased work of breathing. Cardiac exam S1 and S2 are regular. Abdominal exam active bowel sounds, soft, nontender. Extremities no edema. Neurologic exam she is alert without any motor sensory deficits. Gait is normal.  Well visit- heatlh maint utd

## 2014-03-30 ENCOUNTER — Encounter: Payer: Self-pay | Admitting: Internal Medicine

## 2014-06-18 ENCOUNTER — Ambulatory Visit (INDEPENDENT_AMBULATORY_CARE_PROVIDER_SITE_OTHER): Payer: Medicare HMO | Admitting: *Deleted

## 2014-06-18 DIAGNOSIS — Z23 Encounter for immunization: Secondary | ICD-10-CM

## 2014-10-18 ENCOUNTER — Encounter: Payer: Self-pay | Admitting: Family Medicine

## 2014-10-18 DIAGNOSIS — E785 Hyperlipidemia, unspecified: Secondary | ICD-10-CM | POA: Insufficient documentation

## 2014-10-19 ENCOUNTER — Encounter: Payer: Self-pay | Admitting: Family Medicine

## 2014-10-19 ENCOUNTER — Ambulatory Visit (INDEPENDENT_AMBULATORY_CARE_PROVIDER_SITE_OTHER): Payer: Medicare HMO | Admitting: Family Medicine

## 2014-10-19 VITALS — BP 182/98 | Temp 98.1°F | Wt 132.0 lb

## 2014-10-19 DIAGNOSIS — R55 Syncope and collapse: Secondary | ICD-10-CM | POA: Insufficient documentation

## 2014-10-19 DIAGNOSIS — I7300 Raynaud's syndrome without gangrene: Secondary | ICD-10-CM | POA: Insufficient documentation

## 2014-10-19 DIAGNOSIS — E785 Hyperlipidemia, unspecified: Secondary | ICD-10-CM

## 2014-10-19 DIAGNOSIS — I10 Essential (primary) hypertension: Secondary | ICD-10-CM

## 2014-10-19 DIAGNOSIS — R739 Hyperglycemia, unspecified: Secondary | ICD-10-CM

## 2014-10-19 MED ORDER — AMLODIPINE BESYLATE 5 MG PO TABS: 2.5000 mg | ORAL_TABLET | Freq: Every day | ORAL | Status: DC

## 2014-10-19 NOTE — Assessment & Plan Note (Signed)
No rx at time of visit. Fish oil only. 17.7% 10 year risk based off most recent information on 10/18/13. No ASA as well. No CAD/MI history in family, reasonable weight, and physically active-discussed this likely lowers her risk. Patient also nearing age where evidence for primary prevention not as strong. At present she definitely does not want statin, but will consider ASA and discuss at follow up.

## 2014-10-19 NOTE — Assessment & Plan Note (Signed)
Home readings never above 701I and diastolic conrolled on amlodidpine 2.5mg . Asymptomatic. Intermittent elevations such as today in the past. Patient opts to bring in her home cuff at follow up and continue to follow at home and present to care if elevated above 140/90. We discussed obvious risks with this.

## 2014-10-19 NOTE — Progress Notes (Signed)
Megan Reddish, MD Phone: [phone]  Subjective:  Patient presents today to establish care with me as their new primary care provider. Patient was formerly a patient of Megan Olson. Chief complaint-noted.   Patient provides me the following history.   Cold natured. One time last year, reached in freezer and just right middle finger came out white. Has had similar incidents on different fingers. Diagnosed as raynauds.   2005 has had 5 or 6 incidences of passing out. Usually related to food or drink  Goes to the gym 2-3x a week.   Many seborrheic keratosis but does not follow with dermatologist normally. No history skin cancer.   Would continue breast exams, not pap smears.   Hypertension-very poor control in office  BP Readings from Last 3 Encounters:  10/19/14 182/98  03/26/14 130/70  03/24/13 142/92  Home BP monitoring-120-130s/ low to mid 70s or 80.  Has verified home cuff before but then got new cuff Compliant with medications-yes without side effects, amlodipine 2.5mg  ROS-Denies any CP, HA, SOB, blurry vision, LE edema, transient weakness, orthopnea, PND.   Hyperlipidemia-mild poor control  Lab Results  Component Value Date   LDLCALC 121* 03/19/2014  On statin: no, fish oil alone Regular exercise: 3x a week Diet: some poor choices over Christmas time ROS- no chest pain or shortness of breath. No myalgias  The following were reviewed and entered/updated in epic: Past Medical History  Diagnosis Date  . HYPERTENSION 03/11/2007  . OSTEOPENIA 03/11/2007  . Glaucoma    Patient Active Problem List   Diagnosis Date Noted  . Hyperlipidemia 10/18/2014    Priority: Medium  . Essential hypertension 03/11/2007    Priority: Medium  . Glaucoma 01/05/2011    Priority: Low  . Osteopenia 03/11/2007    Priority: Low  . Raynaud phenomenon 10/19/2014  . Syncope 10/19/2014   Past Surgical History  Procedure Laterality Date  . Dilation and curettage of uterus      bleeding at  menopause. No uterine cancer  . Breast lumpectomy  1990    benign  . Tonsillectomy      age 45    Family History  Problem Relation Age of Onset  . Heart disease Mother     CHF mother died 80  . Arthritis Mother   . Glaucoma Mother     sister as well  . Alcohol abuse Father   . Cancer Sister     lung cancer, smoker  . Heart disease Sister     aortic valve replacement  . Hyperlipidemia Brother   . Hypertension Brother   . Arthritis Sister   . Hypertension Sister     Medications- reviewed and updated Current Outpatient Prescriptions  Medication Sig Dispense Refill  . amLODipine (NORVASC) 5 MG tablet Take 0.5 tablets (2.5 mg total) by mouth daily. 45 tablet 3  . Cholecalciferol (VITAMIN D3) 1000 UNITS CAPS Take by mouth daily.      Marland Kitchen co-enzyme Q-10 50 MG capsule Take 50 mg by mouth daily.      . fish oil-omega-3 fatty acids 1000 MG capsule Take 2 g by mouth daily.      . Magnesium Oxide 500 MG TABS Take 1 tablet by mouth daily.      . Multiple Vitamins-Minerals (MULTIVITAMIN WITH MINERALS) tablet Take 1 tablet by mouth daily.      Marland Kitchen Resveratrol 50 MG CAPS Take 1 capsule by mouth daily.    . Bimatoprost (LUMIGAN) 0.01 % SOLN Apply 1 drop to eye at  bedtime. (Patient not taking: Reported on 10/19/2014)    . dorzolamide-timolol (COSOPT) 22.3-6.8 MG/ML ophthalmic solution Place 1 drop into both eyes 2 (two) times daily.     Allergies-reviewed and updated Allergies  Allergen Reactions  . Ace Inhibitors   . Diamox [Acetazolamide]     Hypotensive event at ophthalmologist  . Penicillins     REACTION: rash  . Sulfamethoxazole     REACTION: rash    History   Social History  . Marital Status: Married    Spouse Name: N/A    Number of Children: N/A  . Years of Education: N/A   Social History Main Topics  . Smoking status: Former Smoker -- 0.50 packs/day for 7 years    Types: Cigarettes    Quit date: 01/04/1961  . Smokeless tobacco: None  . Alcohol Use: 0.6 oz/week    1  Not specified per week  . Drug Use: No  . Sexual Activity: None   Other Topics Concern  . None   Social History Narrative   Married. Lives with husband (patient of Dr. Yong Olson). 1 son. No grandkids. 1 granddog.       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 Ext: no edema, 2+ radial and DP pulses Skin: warm, dry Neuro: grossly normal, moves all extremities   Assessment/Plan:  Essential hypertension Home readings never above 517G and diastolic conrolled on amlodidpine 2.5mg . Asymptomatic. Intermittent elevations such as today in the past. Patient opts to bring in her home cuff at follow up and continue to follow at home and present to care if elevated above 140/90. We discussed obvious risks with this.    Hyperlipidemia No rx at time of visit. Fish oil only. 17.7% 10 year risk based off most recent information on 10/18/13. No ASA as well. No CAD/MI history in family, reasonable weight, and physically active-discussed this likely lowers her risk. Patient also nearing age where evidence for primary prevention not as strong. At present she definitely does not want statin, but will consider ASA and discuss at follow up.     Return precautions advised. CPE after June 23rd. prevnar at physical.   >50% of 30 minute office visit was spent on counseling (risks of cholesterol and blood pressure including CVA/MI, consideration of asa and statin, advising sooner follow up to ensure BP improved and home cuff verified which patient declines for near future) and coordination of care  Future fasting labs 1 week  before physical Orders Placed This Encounter  Procedures  . CBC    [place]    Standing Status: Future     Number of Occurrences:      Standing Expiration Date: 10/20/2015  . Comprehensive metabolic panel    [place]    Standing Status: Future     Number of Occurrences:      Standing Expiration Date: 10/20/2015    Order Specific Question:  Has the patient fasted?    Answer:  No  . Lipid panel    [place]    Standing Status: Future     Number of Occurrences:  Standing Expiration Date: 10/20/2015    Order Specific Question:  Has the patient fasted?    Answer:  No  . TSH    [place]    Standing Status: Future     Number of Occurrences:      Standing Expiration Date: 10/20/2015  . Hemoglobin A1c    [place]    Standing Status: Future     Number of Occurrences:      Standing Expiration Date: 10/20/2015    Meds ordered this encounter  Medications  . amLODipine (NORVASC) 5 MG tablet    Sig: Take 0.5 tablets (2.5 mg total) by mouth daily.    Dispense:  45 tablet    Refill:  3

## 2014-10-19 NOTE — Patient Instructions (Addendum)
Physical at next available starting in June 23 or later. Labs a week ahead.   Consider aspirin. 17% 10 year risk for heart attack or stroke would recommend cholesterol medicine bur your other risks are low so you opted to hold off for now.   Prevnar (final pneumonia shot) at your physical  Bring your home cuff next visit. Bring your readings as well. Check your pressure at home and if still elevated like this, please come see [place].

## 2014-10-25 ENCOUNTER — Other Ambulatory Visit: Payer: Self-pay

## 2014-10-25 ENCOUNTER — Encounter: Payer: Self-pay | Admitting: Family Medicine

## 2014-10-25 MED ORDER — AMLODIPINE BESYLATE 2.5 MG PO TABS: 2.5000 mg | ORAL_TABLET | Freq: Every day | ORAL | Status: DC

## 2014-11-02 ENCOUNTER — Other Ambulatory Visit: Payer: Self-pay | Admitting: Family Medicine

## 2014-11-02 DIAGNOSIS — Z1231 Encounter for screening mammogram for malignant neoplasm of breast: Secondary | ICD-10-CM

## 2014-11-06 ENCOUNTER — Ambulatory Visit (HOSPITAL_COMMUNITY)
Admission: RE | Admit: 2014-11-06 | Discharge: 2014-11-06 | Disposition: A | Discharge status: Home / Self Care | Payer: Medicare HMO | Source: Ambulatory Visit | Attending: Family Medicine | Admitting: Family Medicine

## 2014-11-06 DIAGNOSIS — Z1231 Encounter for screening mammogram for malignant neoplasm of breast: Secondary | ICD-10-CM | POA: Diagnosis not present

## 2014-11-19 ENCOUNTER — Other Ambulatory Visit: Payer: Self-pay

## 2014-11-19 ENCOUNTER — Encounter: Payer: Self-pay | Admitting: Family Medicine

## 2014-11-19 MED ORDER — AMLODIPINE BESYLATE 5 MG PO TABS: 5.0000 mg | ORAL_TABLET | Freq: Every day | ORAL | Status: DC

## 2014-11-19 NOTE — Telephone Encounter (Signed)
Megan Olson can you call in 90 day rx for 5mg  amlodipine for patient with 1 refill. Thanks. i am increasing her dose from 2.5mg . I already informed patient of plan.

## 2015-03-22 ENCOUNTER — Other Ambulatory Visit (INDEPENDENT_AMBULATORY_CARE_PROVIDER_SITE_OTHER): Payer: Medicare HMO

## 2015-03-22 DIAGNOSIS — E785 Hyperlipidemia, unspecified: Secondary | ICD-10-CM

## 2015-03-22 DIAGNOSIS — I10 Essential (primary) hypertension: Secondary | ICD-10-CM | POA: Diagnosis not present

## 2015-03-22 DIAGNOSIS — R739 Hyperglycemia, unspecified: Secondary | ICD-10-CM

## 2015-03-22 LAB — COMPREHENSIVE METABOLIC PANEL
ALBUMIN: 4.2 g/dL (ref 3.5–5.2)
ALK PHOS: 37 U/L — AB (ref 39–117)
ALT: 11 U/L (ref 0–35)
AST: 13 U/L (ref 0–37)
BILIRUBIN TOTAL: 0.5 mg/dL (ref 0.2–1.2)
BUN: 16 mg/dL (ref 6–23)
CO2: 30 mEq/L (ref 19–32)
Calcium: 9.4 mg/dL (ref 8.4–10.5)
Chloride: 105 mEq/L (ref 96–112)
Creatinine, Ser: 0.74 mg/dL (ref 0.40–1.20)
GFR: 81.52 mL/min (ref >60.00)
GLUCOSE: 109 mg/dL — AB (ref 70–99)
Potassium: 5 mEq/L (ref 3.5–5.1)
Sodium: 138 mEq/L (ref 135–145)
TOTAL PROTEIN: 7 g/dL (ref 6.0–8.3)

## 2015-03-22 LAB — CBC
HCT: 37 % (ref 36.0–46.0)
Hemoglobin: 12.2 g/dL (ref 12.0–15.0)
MCHC: 33 g/dL (ref 30.0–36.0)
MCV: 95.3 fl (ref 78.0–100.0)
Platelets: 227 10*3/uL (ref 150.0–400.0)
RBC: 3.88 Mil/uL (ref 3.87–5.11)
RDW: 14.7 % (ref 11.5–15.5)
WBC: 2.9 10*3/uL — ABNORMAL LOW (ref 4.0–10.5)

## 2015-03-22 LAB — LIPID PANEL
CHOL/HDL RATIO: 3
Cholesterol: 176 mg/dL (ref 0–200)
HDL: 51.7 mg/dL (ref >39.00)
LDL CALC: 102 mg/dL — AB (ref 0–99)
NONHDL: 124.3
Triglycerides: 114 mg/dL (ref 0.0–149.0)
VLDL: 22.8 mg/dL (ref 0.0–40.0)

## 2015-03-22 LAB — HEMOGLOBIN A1C: HEMOGLOBIN A1C: 5.9 % (ref 4.6–6.5)

## 2015-03-22 LAB — TSH: TSH: 1.47 u[IU]/mL (ref 0.35–4.50)

## 2015-03-29 ENCOUNTER — Encounter: Payer: Self-pay | Admitting: Gastroenterology

## 2015-03-29 ENCOUNTER — Encounter: Payer: Self-pay | Admitting: Family Medicine

## 2015-03-29 ENCOUNTER — Ambulatory Visit (INDEPENDENT_AMBULATORY_CARE_PROVIDER_SITE_OTHER): Payer: Medicare HMO | Admitting: Family Medicine

## 2015-03-29 VITALS — BP 178/94 | HR 78 | Temp 98.3°F | Ht 64.0 in | Wt 126.0 lb

## 2015-03-29 DIAGNOSIS — Z0000 Encounter for general adult medical examination without abnormal findings: Secondary | ICD-10-CM

## 2015-03-29 DIAGNOSIS — R739 Hyperglycemia, unspecified: Secondary | ICD-10-CM | POA: Insufficient documentation

## 2015-03-29 DIAGNOSIS — Z1211 Encounter for screening for malignant neoplasm of colon: Secondary | ICD-10-CM | POA: Diagnosis not present

## 2015-03-29 DIAGNOSIS — I10 Essential (primary) hypertension: Secondary | ICD-10-CM

## 2015-03-29 DIAGNOSIS — Z87891 Personal history of nicotine dependence: Secondary | ICD-10-CM | POA: Diagnosis not present

## 2015-03-29 DIAGNOSIS — Z23 Encounter for immunization: Secondary | ICD-10-CM | POA: Diagnosis not present

## 2015-03-29 DIAGNOSIS — E785 Hyperlipidemia, unspecified: Secondary | ICD-10-CM

## 2015-03-29 LAB — POCT URINALYSIS DIPSTICK
Bilirubin, UA: NEGATIVE
Glucose, UA: NEGATIVE
Ketones, UA: NEGATIVE
LEUKOCYTES UA: NEGATIVE
Nitrite, UA: NEGATIVE
PROTEIN UA: NEGATIVE
SPEC GRAV UA: 1.015
UROBILINOGEN UA: 0.2
pH, UA: 8.5

## 2015-03-29 LAB — URINALYSIS, MICROSCOPIC ONLY: WBC, UA: NONE SEEN (ref 0–?)

## 2015-03-29 NOTE — Assessment & Plan Note (Signed)
Patient concerned about statin risks. Opts for aspirin alone. Discussed would have to use statin if moved into primary prevention in future.

## 2015-03-29 NOTE — Assessment & Plan Note (Signed)
Amlodipine 5mg . Poor control in office. Excellent control at home once again. THis is likely white coat- bring home cuff to verify next visit.

## 2015-03-29 NOTE — Progress Notes (Signed)
Megan Reddish, MD Phone: [phone]  Subjective:  Patient presents today for their annual physical. Chief complaint-noted.   Breast exams continue-asks for every other year  Great exercise pattern  Osteopenia- getting adequate ca/vit D through supplements/diet.   Hypertension-excellent control at home  BP Readings from Last 3 Encounters:  03/29/15 178/94  10/19/14 182/98  03/26/14 130/70   Home BP monitoring-Home cuff- Over 14 days, only 1 reading slghtly over 140. Had 2 readings above 90, on recheck <90 Compliant with medications-amlodipine 5mg , yes without side effects  Hyperlipidemia ASA, statin? 15% 10 year risk. Declines statin despite risks. Opts for aspirin alone.   Given Prevnar today  Former smoker quit 62 3.5 pack years- will add on urine  ROS- full  review of systems was completed and negative.  Pertinent ROS-Denies any CP, HA, SOB, blurry vision, LE edema  The following were reviewed and entered/updated in epic: Past Medical History  Diagnosis Date  . HYPERTENSION 03/11/2007  . OSTEOPENIA 03/11/2007  . Glaucoma    Patient Active Problem List   Diagnosis Date Noted  . Hyperglycemia 03/29/2015    Priority: Medium  . Hyperlipidemia 10/18/2014    Priority: Medium  . Essential hypertension 03/11/2007    Priority: Medium  . Raynaud phenomenon 10/19/2014    Priority: Low  . Syncope 10/19/2014    Priority: Low  . Glaucoma 01/05/2011    Priority: Low  . Osteopenia 03/11/2007    Priority: Low   Past Surgical History  Procedure Laterality Date  . Dilation and curettage of uterus      bleeding at menopause. No uterine cancer  . Breast lumpectomy  1990    benign  . Tonsillectomy      age 50    Family History  Problem Relation Age of Onset  . Heart disease Mother     CHF mother died 27  . Arthritis Mother   . Glaucoma Mother     sister as well  . Alcohol abuse Father   . Cancer Sister     lung cancer, smoker  . Heart disease Sister     aortic  valve replacement  . Hyperlipidemia Brother   . Hypertension Brother   . Arthritis Sister   . Hypertension Sister     Medications- reviewed and updated Current Outpatient Prescriptions  Medication Sig Dispense Refill  . amLODipine (NORVASC) 5 MG tablet Take 1 tablet (5 mg total) by mouth daily. 90 tablet 3  . Cholecalciferol (VITAMIN D3) 1000 UNITS CAPS Take by mouth daily.      Marland Kitchen co-enzyme Q-10 50 MG capsule Take 50 mg by mouth daily.      . dorzolamide-timolol (COSOPT) 22.3-6.8 MG/ML ophthalmic solution Place 1 drop into both eyes 2 (two) times daily.    . fish oil-omega-3 fatty acids 1000 MG capsule Take 2 g by mouth daily.      . Magnesium Oxide 500 MG TABS Take 1 tablet by mouth daily.      . Multiple Vitamins-Minerals (MULTIVITAMIN WITH MINERALS) tablet Take 1 tablet by mouth daily.      Marland Kitchen Resveratrol 50 MG CAPS Take 1 capsule by mouth daily.    . Bimatoprost (LUMIGAN) 0.01 % SOLN Apply 1 drop to eye at bedtime. (Patient not taking: Reported on 03/29/2015)     No current facility-administered medications for this visit.    Allergies-reviewed and updated Allergies  Allergen Reactions  . Ace Inhibitors   . Diamox [Acetazolamide]     Hypotensive event at ophthalmologist  .  Penicillins     REACTION: rash  . Sulfamethoxazole     REACTION: rash    History   Social History  . Marital Status: Married    Spouse Name: N/A  . Number of Children: N/A  . Years of Education: N/A   Social History Main Topics  . Smoking status: Former Smoker -- 0.50 packs/day for 7 years    Types: Cigarettes    Quit date: 01/04/1961  . Smokeless tobacco: Not on file  . Alcohol Use: 0.6 oz/week    1 Standard drinks or equivalent per week  . Drug Use: No  . Sexual Activity: Not on file   Other Topics Concern  . Not on file   Social History Narrative   Married. Lives with husband (patient of Dr. Yong Channel). 1 son. No grandkids. 1 granddog.       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 Oropharynx normal, good dentition Neck: no thyromegaly CV: RRR no murmurs rubs or gallops Lungs: CTAB no crackles, wheeze, rhonchi Abdomen: soft/nontender/nondistended/normal bowel sounds. No rebound or guarding.  Ext: no edema, 2+ PT pulses Skin: warm, dry, recently treated lesion left forearm through demr with cryotherapy, recent biopsy healing fromnose Neuro: grossly normal, moves all extremities, PERRLA  Assessment/Plan:  74 y.o. female presenting for annual physical.  Health Maintenance counseling: 1. Anticipatory guidance: Patient counseled regarding regular dental exams, wearing seatbelts, wearing sunscreen 2. Risk factor reduction:  Advised patient of need for regular exercise (walking daily plus gym 2-3 x a week) and diet rich and fruits and vegetables to reduce risk of heart attack and stroke.  3. Immunizations/screenings/ancillary studies- Prevnar today 4. Colonoscopy 2005- ordered repeat 5. No indication for pap 6. Mammogram 11/06/14 normal. Breast exam declined-plan every other year 7. Derm visit within a month 8. Regular eye exams due to glaucoma  Essential hypertension Amlodipine 5mg . Poor control in office. Excellent control at home once again. THis is likely white coat- bring home cuff to verify next visit.   Hyperlipidemia Patient concerned about statin risks. Opts for aspirin alone. Discussed would have to use statin if moved into primary prevention in future.   1 year CPE  Orders Placed This Encounter  Procedures  . Pneumococcal conjugate vaccine 13-valent  . Ambulatory referral to Gastroenterology    Referral Priority:  Routine     Referral Type:  Consultation    Referral Reason:  Specialty Services Required    Number of Visits Requested:  1  . POCT urinalysis dipstick    In house

## 2015-03-29 NOTE — Patient Instructions (Addendum)
Received final pneumonia shot today ([id number])  Drop off a urine before you leave  BP high in office but looks beautiful at home. Continue amlodipine 5mg  alone  Restart aspirin 81mg   You opted despite increased risk to hold off on cholesterol medicine.   You opted for breast exam next year.   Verbally: 1 year follow up, patient will bring home cuff

## 2015-03-29 NOTE — Addendum Note (Signed)
Addended by: Elmer Picker on: 03/29/2015 10:23 AM   Modules accepted: Orders

## 2015-04-30 ENCOUNTER — Encounter: Payer: Self-pay | Admitting: Gastroenterology

## 2015-05-22 ENCOUNTER — Ambulatory Visit (AMBULATORY_SURGERY_CENTER): Payer: Self-pay

## 2015-05-22 VITALS — Ht 63.25 in | Wt 126.0 lb

## 2015-05-22 DIAGNOSIS — Z1211 Encounter for screening for malignant neoplasm of colon: Secondary | ICD-10-CM

## 2015-05-22 MED ORDER — SUPREP BOWEL PREP KIT 17.5-3.13-1.6 GM/177ML PO SOLN: 1.0000 | Freq: Once | ORAL | Status: DC

## 2015-05-22 NOTE — Progress Notes (Signed)
No allergies to eggs or soy No home oxygen No diet/weight loss meds No past problems with anesthesia  Has email  Refused emmi

## 2015-06-05 ENCOUNTER — Encounter: Payer: Medicare HMO | Admitting: Gastroenterology

## 2015-07-06 DIAGNOSIS — D126 Benign neoplasm of colon, unspecified: Secondary | ICD-10-CM

## 2015-07-06 HISTORY — DX: Benign neoplasm of colon, unspecified: D12.6

## 2015-07-11 DIAGNOSIS — H401132 Primary open-angle glaucoma, bilateral, moderate stage: Secondary | ICD-10-CM | POA: Diagnosis not present

## 2015-07-16 ENCOUNTER — Encounter: Payer: Self-pay | Admitting: Gastroenterology

## 2015-07-16 ENCOUNTER — Ambulatory Visit (AMBULATORY_SURGERY_CENTER): Payer: Medicare HMO | Admitting: Gastroenterology

## 2015-07-16 VITALS — BP 119/84 | HR 68 | Temp 98.0°F | Resp 28 | Ht 63.0 in | Wt 126.0 lb

## 2015-07-16 DIAGNOSIS — I10 Essential (primary) hypertension: Secondary | ICD-10-CM | POA: Diagnosis not present

## 2015-07-16 DIAGNOSIS — Z1211 Encounter for screening for malignant neoplasm of colon: Secondary | ICD-10-CM

## 2015-07-16 DIAGNOSIS — D122 Benign neoplasm of ascending colon: Secondary | ICD-10-CM | POA: Diagnosis not present

## 2015-07-16 DIAGNOSIS — I7300 Raynaud's syndrome without gangrene: Secondary | ICD-10-CM | POA: Diagnosis not present

## 2015-07-16 MED ORDER — SODIUM CHLORIDE 0.9 % IV SOLN: 500.0000 mL | INTRAVENOUS | Status: DC

## 2015-07-16 NOTE — Progress Notes (Signed)
Called to room to assist during endoscopic procedure.  Patient ID and intended procedure confirmed with present staff. Received instructions for my participation in the procedure from the performing physician.  

## 2015-07-16 NOTE — Op Note (Signed)
[facility]  [address] [place], [address]   COLONOSCOPY PROCEDURE REPORT  PATIENT: Megan, Olson  MR#: [id number] BIRTHDATE: 03-18-1941 , 74  yrs. old GENDER: female ENDOSCOPIST: Ladene Artist, MD, [facility] REFERRED OV:ZCHYIFO Kristian Covey, M.D. PROCEDURE DATE:  07/16/2015 PROCEDURE:   Colonoscopy, screening and Colonoscopy with snare polypectomy First Screening Colonoscopy - Avg.  risk and is 50 yrs.  old or older - No.  Prior Negative Screening - Now for repeat screening. 10 or more years since last screening  History of Adenoma - Now for follow-up colonoscopy & has been > or = to 3 yrs.  N/A  Polyps removed today? Yes ASA CLASS:   Class II INDICATIONS:Screening for colonic neoplasia and Colorectal Neoplasm Risk Assessment for this procedure is average risk. MEDICATIONS: Monitored anesthesia care, Propofol 200 mg IV, and Ephedrine 20 mg IV DESCRIPTION OF PROCEDURE:   After the risks benefits and alternatives of the procedure were thoroughly explained, informed consent was obtained.  The digital rectal exam revealed no abnormalities of the rectum.   The LB [id number]  endoscope was introduced through the anus and advanced to the cecum, which was identified by both the appendix and ileocecal valve. No adverse events experienced.   Limited by a very tortuous and redundant colon.   The quality of the prep was adequate (Suprep was used) The instrument was then slowly withdrawn as the colon was fully examined. Estimated blood loss is zero unless otherwise noted in this procedure report.  COLON FINDINGS: A sessile polyp measuring 5 mm in size was found in the ascending colon.  A polypectomy was performed with a cold snare.  The resection was complete, the polyp tissue was completely retrieved and sent to histology.   The examination was otherwise normal.  Retroflexed views revealed internal Grade I hemorrhoids. The time to cecum = 14.1 Withdrawal time = 9.3    The scope was withdrawn and the procedure completed. COMPLICATIONS: There were no immediate complications.  ENDOSCOPIC IMPRESSION: 1.   Sessile polyp in the ascending colon; polypectomy was performed with a cold snare 2.   Grade l internal hemorrhoids  RECOMMENDATIONS: 1.  Hold Aspirin and all other NSAIDS for 2 weeks. 2.  Await pathology results 3.  Consider repeat colonoscopy in 5 years if polyp is adenomatous however given age and tortuous, redundant colon may defer; otherwise, given your age, you will not need another colonoscopy for colon cancer screening or polyp surveillance.  These types of tests usually stop around the age 38.  eSigned:  Ladene Artist, MD, [facility] 07/16/2015 10:26 AM

## 2015-07-16 NOTE — Patient Instructions (Signed)
YOU HAD AN ENDOSCOPIC PROCEDURE TODAY AT [facility]:   Refer to the procedure report that was given to you for any specific questions about what was found during the examination.  If the procedure report does not answer your questions, please call your gastroenterologist to clarify.  If you requested that your care partner not be given the details of your procedure findings, then the procedure report has been included in a sealed envelope for you to review at your convenience later.  YOU SHOULD EXPECT: Some feelings of bloating in the abdomen. Passage of more gas than usual.  Walking can help get rid of the air that was put into your GI tract during the procedure and reduce the bloating. If you had a lower endoscopy (such as a colonoscopy or flexible sigmoidoscopy) you may notice spotting of blood in your stool or on the toilet paper. If you underwent a bowel prep for your procedure, you may not have a normal bowel movement for a few days.  Please Note:  You might notice some irritation and congestion in your nose or some drainage.  This is from the oxygen used during your procedure.  There is no need for concern and it should clear up in a day or so.  SYMPTOMS TO REPORT IMMEDIATELY:   Following lower endoscopy (colonoscopy or flexible sigmoidoscopy):  Excessive amounts of blood in the stool  Significant tenderness or worsening of abdominal pains  Swelling of the abdomen that is new, acute  Fever of 100F or higher   For urgent or emergent issues, a gastroenterologist can be reached at any hour by calling [phone].   DIET: Your first meal following the procedure should be a small meal and then it is ok to progress to your normal diet. Heavy or fried foods are harder to digest and may make you feel nauseous or bloated.  Likewise, meals heavy in dairy and vegetables can increase bloating.  Drink plenty of fluids but you should avoid alcoholic beverages for 24  hours.  ACTIVITY:  You should plan to take it easy for the rest of today and you should NOT DRIVE or use heavy machinery until tomorrow (because of the sedation medicines used during the test).    FOLLOW UP: Our staff will call the number listed on your records the next business day following your procedure to check on you and address any questions or concerns that you may have regarding the information given to you following your procedure. If we do not reach you, we will leave a message.  However, if you are feeling well and you are not experiencing any problems, there is no need to return our call.  We will assume that you have returned to your regular daily activities without incident.  If any biopsies were taken you will be contacted by phone or by letter within the next 1-3 weeks.  Please call us at [phone] if you have not heard about the biopsies in 3 weeks.    SIGNATURES/CONFIDENTIALITY: You and/or your care partner have signed paperwork which will be entered into your electronic medical record.  These signatures attest to the fact that that the information above on your After Visit Summary has been reviewed and is understood.  Full responsibility of the confidentiality of this discharge information lies with you and/or your care-partner.  Hold Aspirin and all other NSAIDS for 2 weeks Await pathology results

## 2015-07-16 NOTE — Progress Notes (Signed)
Report to PACU, RN, vss, BBS= Clear.  

## 2015-07-17 ENCOUNTER — Telehealth: Payer: Self-pay

## 2015-07-17 NOTE — Telephone Encounter (Signed)
  Follow up Call-  Call back number 07/16/2015  Post procedure Call Back phone  # [phone]  Permission to leave phone message Yes     Patient questions:  Do you have a fever, pain , or abdominal swelling? No. Pain Score  0 *  Have you tolerated food without any problems? Yes.    Have you been able to return to your normal activities? Yes.    Do you have any questions about your discharge instructions: Diet   No. Medications  No. Follow up visit  No.  Do you have questions or concerns about your Care? No.  Actions: * If pain score is 4 or above: No action needed, pain <4.  Pt did mention she was having a clear nasal drainage.  I advised her this could be from the O2 she had during the exam.  I recommended OTC saline nasal spray.  I told her this many take a few days to clear, but to call us back if any further problems.  Pt said she would. maw

## 2015-07-22 ENCOUNTER — Telehealth: Payer: Self-pay | Admitting: Gastroenterology

## 2015-07-22 ENCOUNTER — Encounter: Payer: Self-pay | Admitting: Gastroenterology

## 2015-07-22 NOTE — Telephone Encounter (Signed)
Discussed the results and the letter with the patient

## 2015-07-23 ENCOUNTER — Encounter: Payer: Self-pay | Admitting: Family Medicine

## 2015-07-23 DIAGNOSIS — Z86010 Personal history of colonic polyps: Secondary | ICD-10-CM | POA: Insufficient documentation

## 2015-07-24 DIAGNOSIS — H401132 Primary open-angle glaucoma, bilateral, moderate stage: Secondary | ICD-10-CM | POA: Diagnosis not present

## 2015-08-07 DIAGNOSIS — H401122 Primary open-angle glaucoma, left eye, moderate stage: Secondary | ICD-10-CM | POA: Diagnosis not present

## 2015-09-11 DIAGNOSIS — H401122 Primary open-angle glaucoma, left eye, moderate stage: Secondary | ICD-10-CM | POA: Diagnosis not present

## 2015-09-11 DIAGNOSIS — H401112 Primary open-angle glaucoma, right eye, moderate stage: Secondary | ICD-10-CM | POA: Diagnosis not present

## 2015-10-09 DIAGNOSIS — H401122 Primary open-angle glaucoma, left eye, moderate stage: Secondary | ICD-10-CM | POA: Diagnosis not present

## 2015-11-20 DIAGNOSIS — H401132 Primary open-angle glaucoma, bilateral, moderate stage: Secondary | ICD-10-CM | POA: Diagnosis not present

## 2015-11-25 ENCOUNTER — Encounter: Payer: Self-pay | Admitting: Family Medicine

## 2015-11-25 ENCOUNTER — Other Ambulatory Visit: Payer: Self-pay

## 2015-11-25 MED ORDER — AMLODIPINE BESYLATE 5 MG PO TABS: 5.0000 mg | ORAL_TABLET | Freq: Every day | ORAL | Status: DC

## 2015-12-10 DIAGNOSIS — H401132 Primary open-angle glaucoma, bilateral, moderate stage: Secondary | ICD-10-CM | POA: Diagnosis not present

## 2015-12-17 ENCOUNTER — Other Ambulatory Visit: Payer: Self-pay

## 2015-12-17 DIAGNOSIS — Z1231 Encounter for screening mammogram for malignant neoplasm of breast: Secondary | ICD-10-CM

## 2015-12-26 ENCOUNTER — Ambulatory Visit
Admission: RE | Admit: 2015-12-26 | Discharge: 2015-12-26 | Disposition: A | Discharge status: Home / Self Care | Payer: Medicare HMO | Source: Ambulatory Visit

## 2015-12-26 DIAGNOSIS — Z1231 Encounter for screening mammogram for malignant neoplasm of breast: Secondary | ICD-10-CM

## 2016-03-10 DIAGNOSIS — H401122 Primary open-angle glaucoma, left eye, moderate stage: Secondary | ICD-10-CM | POA: Diagnosis not present

## 2016-03-10 DIAGNOSIS — H401112 Primary open-angle glaucoma, right eye, moderate stage: Secondary | ICD-10-CM | POA: Diagnosis not present

## 2016-03-25 ENCOUNTER — Other Ambulatory Visit (INDEPENDENT_AMBULATORY_CARE_PROVIDER_SITE_OTHER): Payer: Medicare HMO

## 2016-03-25 DIAGNOSIS — Z0000 Encounter for general adult medical examination without abnormal findings: Secondary | ICD-10-CM | POA: Diagnosis not present

## 2016-03-25 LAB — POC URINALSYSI DIPSTICK (AUTOMATED)
BILIRUBIN UA: NEGATIVE
Blood, UA: NEGATIVE
GLUCOSE UA: NEGATIVE
KETONES UA: NEGATIVE
LEUKOCYTES UA: NEGATIVE
Nitrite, UA: NEGATIVE
Protein, UA: NEGATIVE
Spec Grav, UA: 1.015
Urobilinogen, UA: 0.2
pH, UA: 8.5

## 2016-03-25 LAB — CBC WITH DIFFERENTIAL/PLATELET
Basophils Absolute: 0 10*3/uL (ref 0.0–0.1)
Basophils Relative: 1.3 % (ref 0.0–3.0)
EOS PCT: 2.6 % (ref 0.0–5.0)
Eosinophils Absolute: 0.1 10*3/uL (ref 0.0–0.7)
HCT: 36.1 % (ref 36.0–46.0)
Hemoglobin: 12.1 g/dL (ref 12.0–15.0)
LYMPHS ABS: 1.1 10*3/uL (ref 0.7–4.0)
Lymphocytes Relative: 33.7 % (ref 12.0–46.0)
MCHC: 33.5 g/dL (ref 30.0–36.0)
MCV: 93.6 fl (ref 78.0–100.0)
MONO ABS: 0.3 10*3/uL (ref 0.1–1.0)
Monocytes Relative: 9.4 % (ref 3.0–12.0)
NEUTROS PCT: 53 % (ref 43.0–77.0)
Neutro Abs: 1.7 10*3/uL (ref 1.4–7.7)
Platelets: 223 10*3/uL (ref 150.0–400.0)
RBC: 3.86 Mil/uL — AB (ref 3.87–5.11)
RDW: 14.1 % (ref 11.5–15.5)
WBC: 3.2 10*3/uL — ABNORMAL LOW (ref 4.0–10.5)

## 2016-03-25 LAB — BASIC METABOLIC PANEL
BUN: 14 mg/dL (ref 6–23)
CO2: 30 mEq/L (ref 19–32)
CREATININE: 0.69 mg/dL (ref 0.40–1.20)
Calcium: 9.2 mg/dL (ref 8.4–10.5)
Chloride: 104 mEq/L (ref 96–112)
GFR: 88.13 mL/min (ref >60.00)
GLUCOSE: 110 mg/dL — AB (ref 70–99)
POTASSIUM: 3.7 meq/L (ref 3.5–5.1)
Sodium: 139 mEq/L (ref 135–145)

## 2016-03-25 LAB — LIPID PANEL
CHOLESTEROL: 178 mg/dL (ref 0–200)
HDL: 53.2 mg/dL (ref >39.00)
LDL CALC: 106 mg/dL — AB (ref 0–99)
NonHDL: 125.16
TRIGLYCERIDES: 97 mg/dL (ref 0.0–149.0)
Total CHOL/HDL Ratio: 3
VLDL: 19.4 mg/dL (ref 0.0–40.0)

## 2016-03-25 LAB — HEPATIC FUNCTION PANEL
ALT: 12 U/L (ref 0–35)
AST: 12 U/L (ref 0–37)
Albumin: 4.2 g/dL (ref 3.5–5.2)
Alkaline Phosphatase: 39 U/L (ref 39–117)
BILIRUBIN TOTAL: 0.6 mg/dL (ref 0.2–1.2)
Bilirubin, Direct: 0.1 mg/dL (ref 0.0–0.3)
Total Protein: 7.2 g/dL (ref 6.0–8.3)

## 2016-03-25 LAB — TSH: TSH: 1.84 u[IU]/mL (ref 0.35–4.50)

## 2016-04-01 ENCOUNTER — Ambulatory Visit (INDEPENDENT_AMBULATORY_CARE_PROVIDER_SITE_OTHER): Payer: Medicare HMO | Admitting: Family Medicine

## 2016-04-01 ENCOUNTER — Encounter: Payer: Self-pay | Admitting: Family Medicine

## 2016-04-01 VITALS — BP 142/102 | HR 71 | Temp 97.7°F | Ht 63.25 in | Wt 119.0 lb

## 2016-04-01 DIAGNOSIS — Z0000 Encounter for general adult medical examination without abnormal findings: Secondary | ICD-10-CM | POA: Diagnosis not present

## 2016-04-01 MED ORDER — AMLODIPINE BESYLATE 5 MG PO TABS: 5.0000 mg | ORAL_TABLET | Freq: Every day | ORAL | Status: DC

## 2016-04-01 NOTE — Assessment & Plan Note (Signed)
a1c 5.9 previously, cbg 110. Do not want further weight loss and CBG did not improve very much despite 7 lbs weight loss.

## 2016-04-01 NOTE — Assessment & Plan Note (Signed)
controlled on amlodipine 5mg . Home #s excellent. Elevated in office.  157/98 on her monitor, rechecked by me at 160/100 and then 158/96- Home cuff verified.

## 2016-04-01 NOTE — Progress Notes (Signed)
Pre visit review using our clinic review tool, if applicable. No additional management support is needed unless otherwise documented below in the visit note. 

## 2016-04-01 NOTE — Patient Instructions (Signed)
No changes today  Would not lose anymore weight  Consider bone density next year  Home blood pressure cuff looks great

## 2016-04-01 NOTE — Assessment & Plan Note (Signed)
10 year risk 14.8% - declines statin, worried about diabetes risk and near point where risk/benefit unclear per ascvd

## 2016-04-01 NOTE — Assessment & Plan Note (Signed)
repeat DEXA discussed, opts for next year. Fosamax for 3-4 years in past and eventually came off concerned of SE

## 2016-04-01 NOTE — Progress Notes (Signed)
Phone: [phone]  Subjective:  Patient presents today for their annual physical. Chief complaint-noted.   See problem oriented charting- ROS- full  review of systems was completed and negative including No chest pain or shortness of breath. No headache or blurry vision.   The following were reviewed and entered/updated in epic: Past Medical History  Diagnosis Date  . HYPERTENSION 03/11/2007  . OSTEOPENIA 03/11/2007  . Glaucoma    Patient Active Problem List   Diagnosis Date Noted  . Hyperglycemia 03/29/2015    Priority: Medium  . Hyperlipidemia 10/18/2014    Priority: Medium  . Essential hypertension 03/11/2007    Priority: Medium  . History of adenomatous polyp of colon 07/23/2015    Priority: Low  . Raynaud phenomenon 10/19/2014    Priority: Low  . Syncope 10/19/2014    Priority: Low  . Glaucoma 01/05/2011    Priority: Low  . Osteopenia 03/11/2007    Priority: Low   Past Surgical History  Procedure Laterality Date  . Dilation and curettage of uterus      bleeding at menopause. No uterine cancer  . Breast lumpectomy  1990    benign  . Tonsillectomy      age 48    Family History  Problem Relation Age of Onset  . Heart disease Mother     CHF mother died 91  . Arthritis Mother   . Glaucoma Mother     sister as well  . Alcohol abuse Father   . Cancer Sister     lung cancer, smoker  . Heart disease Sister     aortic valve replacement  . Hyperlipidemia Brother   . Hypertension Brother   . Arthritis Sister   . Hypertension Sister   . Colon cancer Neg Hx   . Colon polyps Neg Hx     Medications- reviewed and updated Current Outpatient Prescriptions  Medication Sig Dispense Refill  . amLODipine (NORVASC) 5 MG tablet Take 1 tablet (5 mg total) by mouth daily. 90 tablet 2  . aspirin EC 81 MG tablet Take 81 mg by mouth daily.    . bimatoprost (LUMIGAN) 0.03 % ophthalmic solution Place 1 drop into both eyes at bedtime.    . brimonidine-timolol (COMBIGAN)  0.2-0.5 % ophthalmic solution Place 1 drop into both eyes every 12 (twelve) hours.    . Cholecalciferol (VITAMIN D3) 1000 UNITS CAPS Take by mouth daily.      Marland Kitchen co-enzyme Q-10 50 MG capsule Take 50 mg by mouth daily.      . Magnesium Oxide 500 MG TABS Take 1 tablet by mouth daily.      . Multiple Vitamins-Minerals (MULTIVITAMIN WITH MINERALS) tablet Take 1 tablet by mouth daily.      Marland Kitchen omega-3 acid ethyl esters (LOVAZA) 1 g capsule Take 1 g by mouth daily.    Marland Kitchen Resveratrol 50 MG CAPS Take 1 capsule by mouth daily.     No current facility-administered medications for this visit.    Allergies-reviewed and updated Allergies  Allergen Reactions  . Ace Inhibitors   . Diamox [Acetazolamide]     Hypotensive event at ophthalmologist  . Penicillins     REACTION: rash  . Sulfamethoxazole     REACTION: rash    Social History   Social History  . Marital Status: Married    Spouse Name: N/A  . Number of Children: N/A  . Years of Education: N/A   Social History Main Topics  . Smoking status: Former Smoker -- 0.50 packs/day  for 7 years    Types: Cigarettes    Quit date: 01/04/1961  . Smokeless tobacco: Never Used  . Alcohol Use: 0.6 oz/week    1 Standard drinks or equivalent per week  . Drug Use: No  . Sexual Activity: Not on file   Other Topics Concern  . Not on file   Social History Narrative   Married. Lives with husband (patient of Dr. Yong Channel). 1 son. No grandkids. 1 granddog.       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 Oropharynx normal Neck: no thyromegaly CV: RRR no murmurs rubs or gallops Lungs: CTAB no crackles, wheeze,  rhonchi Abdomen: soft/nontender/nondistended/normal bowel sounds. No rebound or guarding.  Ext: no edema Skin: warm, dry Neuro: grossly normal, moves all extremities, PERRLA  Assessment/Plan:  75 y.o. female presenting for annual physical.  Health Maintenance counseling: 1. Anticipatory guidance: Patient counseled regarding regular dental exams, eye exams, wearing seatbelts.  2. Risk factor reduction:  Advised patient of need for regular exercise and diet rich and fruits and vegetables to reduce risk of heart attack and stroke. Gym 2x a week, mows lawn, walks regularly. Has lost weight- trying to eat better. Advised no further weight loss 3. Immunizations/screenings/ancillary studies Immunization History  Administered Date(s) Administered  . Influenza Split 07/19/2012  . Influenza Whole 07/17/2008, 06/28/2009, 06/25/2010  . Influenza,inj,Quad PF,36+ Mos 06/27/2013, 06/18/2014  . Influenza-Unspecified 07/03/2015  . Pneumococcal Conjugate-13 03/29/2015  . Pneumococcal Polysaccharide-23 04/04/2006  . Td 02/10/2010  . Zoster 09/26/2008   4. Cervical cancer screening- aged out and no history abnormal 5. Breast cancer screening-  breast exam declined and mammogram 12/26/15 normal 6. Colon cancer screening - 07/2015 adenoma with 5 year repeat? 7. Skin cancer screening- saw within last year dermatology  Status of chronic or acute concerns   Left great toe onychomycosis- discussed lamisil , opts out for now  Hyperlipidemia 10 year risk 14.8% - declines statin, worried about diabetes risk and near point where risk/benefit unclear per ascvd  Hyperglycemia  a1c 5.9 previously, cbg 110. Do not want further weight loss and CBG did not improve very much despite 7 lbs weight loss.   Essential hypertension controlled on amlodipine 5mg . Home #s excellent. Elevated in office.  157/98 on her monitor, rechecked by me at 160/100 and then 158/96- Home cuff verified.   Osteopenia repeat DEXA  discussed, opts for next year. Fosamax for 3-4 years in past and eventually came off concerned of SE    Return in about 1 year (around 04/01/2017) for physical.  Meds ordered this encounter  Medications  . omega-3 acid ethyl esters (LOVAZA) 1 g capsule    Sig: Take 1 g by mouth daily.  Marland Kitchen amLODipine (NORVASC) 5 MG tablet    Sig: Take 1 tablet (5 mg total) by mouth daily.    Dispense:  90 tablet    Refill:  3    Return precautions advised.   Garret Reddish, MD

## 2016-04-28 DIAGNOSIS — R69 Illness, unspecified: Secondary | ICD-10-CM | POA: Diagnosis not present

## 2016-05-12 DIAGNOSIS — H401131 Primary open-angle glaucoma, bilateral, mild stage: Secondary | ICD-10-CM | POA: Diagnosis not present

## 2016-07-22 ENCOUNTER — Ambulatory Visit (INDEPENDENT_AMBULATORY_CARE_PROVIDER_SITE_OTHER): Payer: Medicare HMO | Admitting: Family Medicine

## 2016-07-22 DIAGNOSIS — Z23 Encounter for immunization: Secondary | ICD-10-CM

## 2016-10-05 HISTORY — PX: CATARACT EXTRACTION: SUR2

## 2016-11-10 DIAGNOSIS — H40053 Ocular hypertension, bilateral: Secondary | ICD-10-CM | POA: Diagnosis not present

## 2016-11-11 DIAGNOSIS — H4089 Other specified glaucoma: Secondary | ICD-10-CM | POA: Diagnosis not present

## 2016-11-18 DIAGNOSIS — H25812 Combined forms of age-related cataract, left eye: Secondary | ICD-10-CM | POA: Diagnosis not present

## 2016-11-18 DIAGNOSIS — H21513 Anterior synechiae (iris), bilateral: Secondary | ICD-10-CM | POA: Diagnosis not present

## 2016-11-18 DIAGNOSIS — H4053X1 Glaucoma secondary to other eye disorders, bilateral, mild stage: Secondary | ICD-10-CM | POA: Diagnosis not present

## 2016-11-18 DIAGNOSIS — H25811 Combined forms of age-related cataract, right eye: Secondary | ICD-10-CM | POA: Diagnosis not present

## 2016-11-19 ENCOUNTER — Other Ambulatory Visit: Payer: Self-pay | Admitting: Ophthalmology

## 2016-11-19 ENCOUNTER — Ambulatory Visit
Admission: RE | Admit: 2016-11-19 | Discharge: 2016-11-19 | Disposition: A | Discharge status: Home / Self Care | Payer: Medicare HMO | Source: Ambulatory Visit | Attending: Ophthalmology | Admitting: Ophthalmology

## 2016-11-19 DIAGNOSIS — H21513 Anterior synechiae (iris), bilateral: Secondary | ICD-10-CM

## 2016-11-19 DIAGNOSIS — Z87891 Personal history of nicotine dependence: Secondary | ICD-10-CM | POA: Diagnosis not present

## 2016-11-21 ENCOUNTER — Encounter: Payer: Self-pay | Admitting: Family Medicine

## 2016-11-23 ENCOUNTER — Encounter: Payer: Self-pay | Admitting: Family Medicine

## 2016-12-08 DIAGNOSIS — H02052 Trichiasis without entropian right lower eyelid: Secondary | ICD-10-CM | POA: Diagnosis not present

## 2016-12-08 DIAGNOSIS — H25812 Combined forms of age-related cataract, left eye: Secondary | ICD-10-CM | POA: Diagnosis not present

## 2016-12-08 DIAGNOSIS — H02055 Trichiasis without entropian left lower eyelid: Secondary | ICD-10-CM | POA: Diagnosis not present

## 2016-12-08 DIAGNOSIS — H25811 Combined forms of age-related cataract, right eye: Secondary | ICD-10-CM | POA: Diagnosis not present

## 2016-12-28 DIAGNOSIS — H25811 Combined forms of age-related cataract, right eye: Secondary | ICD-10-CM | POA: Diagnosis not present

## 2016-12-28 DIAGNOSIS — H4051X1 Glaucoma secondary to other eye disorders, right eye, mild stage: Secondary | ICD-10-CM | POA: Diagnosis not present

## 2016-12-28 DIAGNOSIS — H4053X1 Glaucoma secondary to other eye disorders, bilateral, mild stage: Secondary | ICD-10-CM | POA: Diagnosis not present

## 2017-01-27 ENCOUNTER — Other Ambulatory Visit: Payer: Self-pay | Admitting: Family Medicine

## 2017-01-27 DIAGNOSIS — Z1231 Encounter for screening mammogram for malignant neoplasm of breast: Secondary | ICD-10-CM

## 2017-02-02 ENCOUNTER — Encounter: Payer: Self-pay | Admitting: Family Medicine

## 2017-02-04 ENCOUNTER — Other Ambulatory Visit: Payer: Self-pay

## 2017-02-04 ENCOUNTER — Ambulatory Visit (INDEPENDENT_AMBULATORY_CARE_PROVIDER_SITE_OTHER): Payer: Medicare HMO | Admitting: Family Medicine

## 2017-02-04 ENCOUNTER — Encounter: Payer: Self-pay | Admitting: Family Medicine

## 2017-02-04 VITALS — BP 160/98 | HR 79 | Temp 98.1°F | Ht 63.25 in | Wt 122.4 lb

## 2017-02-04 DIAGNOSIS — I10 Essential (primary) hypertension: Secondary | ICD-10-CM | POA: Diagnosis not present

## 2017-02-04 DIAGNOSIS — Q839 Congenital malformation of breast, unspecified: Secondary | ICD-10-CM | POA: Diagnosis not present

## 2017-02-04 NOTE — Progress Notes (Signed)
Subjective:  RASHUNDA PASSON is a 76 y.o. year old very pleasant female patient who presents for/with See problem oriented charting ROS- no fever, chlls. No expanding redness. No chest pain or shortness of breath. Has felt some flushed in face but no night sweats.    Past Medical History-  Patient Active Problem List   Diagnosis Date Noted  . Hyperglycemia 03/29/2015    Priority: Medium  . Hyperlipidemia 10/18/2014    Priority: Medium  . Essential hypertension 03/11/2007    Priority: Medium  . History of adenomatous polyp of colon 07/23/2015    Priority: Low  . Raynaud phenomenon 10/19/2014    Priority: Low  . Syncope 10/19/2014    Priority: Low  . Glaucoma 01/05/2011    Priority: Low  . Osteopenia 03/11/2007    Priority: Low    Medications- reviewed and updated Current Outpatient Prescriptions  Medication Sig Dispense Refill  . ALPHAGAN P 0.1 % SOLN Apply 1 drop topically daily. Apply 2 drops in left eye and three in right daily    . amLODipine (NORVASC) 5 MG tablet Take 1 tablet (5 mg total) by mouth daily. 90 tablet 3  . aspirin EC 81 MG tablet Take 81 mg by mouth daily.    . bimatoprost (LUMIGAN) 0.03 % ophthalmic solution Place 1 drop into both eyes at bedtime.    . brimonidine-timolol (COMBIGAN) 0.2-0.5 % ophthalmic solution Place 1 drop into both eyes every 12 (twelve) hours.    . Cholecalciferol (VITAMIN D3) 1000 UNITS CAPS Take by mouth daily.      Marland Kitchen co-enzyme Q-10 50 MG capsule Take 50 mg by mouth daily.      . Magnesium Oxide 500 MG TABS Take 1 tablet by mouth daily.      . Multiple Vitamins-Minerals (MULTIVITAMIN WITH MINERALS) tablet Take 1 tablet by mouth daily.      Marland Kitchen omega-3 acid ethyl esters (LOVAZA) 1 g capsule Take 1 g by mouth daily.    Marland Kitchen Resveratrol 50 MG CAPS Take 1 capsule by mouth daily.     No current facility-administered medications for this visit.     Objective: BP (!) 160/98 (BP Location: Left Arm, Patient Position: Sitting, Cuff Size: Normal)    Pulse 79   Temp 98.1 F (36.7 C) (Oral)   Ht 5' 3.25" (1.607 m)   Wt 122 lb 6.4 oz (55.5 kg)   SpO2 97%   BMI 21.51 kg/m  Gen: NAD, resting comfortably CV: RRR no murmurs rubs or gallops Lungs: CTAB no crackles, wheeze, rhonchi Abdomen: soft/nontender/nondistended/normal bowel sounds. No rebound or guarding.  Breasts: normal appearance, no masses or tenderness on the left breast, No axillary or supraclavicular adenopathy bilaterally, positive findings: nipple on the left has about 1-2 mm whitish area and another smaller area about 9 o clock from this. Has some surrounding erythema as well.   Assessment/Plan:  Nipple anomaly - Plan: MM Digital Diagnostic Unilat L, US BREAST LTD UNI LEFT INC AXILLA  Essential hypertension S: 5 days ago noted White creamy colored change to skin around her nipple. Had not noticed the redness but we noted this on exam today. No scaling. No pain or itching. No treatments tried A/P: I am concerned about potential Paget disease of breast but this could be a localized infection as well though would be atypical. Will get diagnostic mammogram of left and ultrasound. Wonder if patient will need biopsy of the affected area  Essential hypertension home readings 120s to low 130s over 70s  to low 80s. White coat hypertension in office. Compliant with amlodipine. Monitor only.   Orders Placed This Encounter  Procedures  . MM Digital Diagnostic Unilat L    Standing Status:   Future    Standing Expiration Date:   04/06/2018    Order Specific Question:   Reason for Exam (SYMPTOM  OR DIAGNOSIS REQUIRED)    Answer:   whitish discoloration left nipple. erythematous base. concern paget disease of breast.    Order Specific Question:   Preferred imaging location?    Answer:   [facility]  . US BREAST LTD UNI LEFT INC AXILLA    AETNA MCR PF BCG:12/26/2015 LS AND DEBRA [phone] EPIC ORDER     Standing Status:   Future    Standing Expiration Date:   04/06/2018     Order Specific Question:   Reason for Exam (SYMPTOM  OR DIAGNOSIS REQUIRED)    Answer:   whitish discoloration left nipple. erythematous base. concern paget disease of breast.    Order Specific Question:   Preferred imaging location?    Answer:   [facility]    Meds ordered this encounter  Medications  . ALPHAGAN P 0.1 % SOLN    Sig: Apply 1 drop topically daily. Apply 2 drops in left eye and three in right daily  new acute high risk condition- nipple anomaly. Stable chronic hypertension.  Return precautions advised.  Garret Reddish, MD

## 2017-02-04 NOTE — Assessment & Plan Note (Signed)
home readings 120s to low 130s over 70s to low 80s. White coat hypertension in office. Compliant with amlodipine. Monitor only.

## 2017-02-04 NOTE — Progress Notes (Signed)
Pre visit review using our clinic review tool, if applicable. No additional management support is needed unless otherwise documented below in the visit note. 

## 2017-02-04 NOTE — Patient Instructions (Signed)
Will get ultrasound and diagnostic mammogram. Ordering this as urgent so please wait for our scheduler before you leave the room today  [phone]. We are ordering the ultrasound. As soon as you have that appointment call that number to get the mammogram set up as well- hopeful they can do right around the same time

## 2017-02-05 ENCOUNTER — Ambulatory Visit
Admission: RE | Admit: 2017-02-05 | Discharge: 2017-02-05 | Disposition: A | Discharge status: Home / Self Care | Payer: Medicare HMO | Source: Ambulatory Visit | Attending: Family Medicine | Admitting: Family Medicine

## 2017-02-05 DIAGNOSIS — Q839 Congenital malformation of breast, unspecified: Secondary | ICD-10-CM

## 2017-02-05 DIAGNOSIS — R928 Other abnormal and inconclusive findings on diagnostic imaging of breast: Secondary | ICD-10-CM | POA: Diagnosis not present

## 2017-02-05 DIAGNOSIS — N6489 Other specified disorders of breast: Secondary | ICD-10-CM | POA: Diagnosis not present

## 2017-02-16 ENCOUNTER — Ambulatory Visit: Payer: Medicare HMO

## 2017-02-17 ENCOUNTER — Encounter: Payer: Self-pay | Admitting: Family Medicine

## 2017-02-17 ENCOUNTER — Ambulatory Visit (INDEPENDENT_AMBULATORY_CARE_PROVIDER_SITE_OTHER): Payer: Medicare HMO | Admitting: Family Medicine

## 2017-02-17 VITALS — BP 162/96 | HR 72 | Temp 98.2°F | Ht 63.25 in | Wt 123.8 lb

## 2017-02-17 DIAGNOSIS — Q839 Congenital malformation of breast, unspecified: Secondary | ICD-10-CM | POA: Diagnosis not present

## 2017-02-17 DIAGNOSIS — I10 Essential (primary) hypertension: Secondary | ICD-10-CM

## 2017-02-17 NOTE — Progress Notes (Signed)
Subjective:  Megan Olson is a 76 y.o. year old very pleasant female patient who presents for/with See problem oriented charting ROS- ROS-no fevers, chills, fatigue/malaise, nausea/vomiting, or recent weight change. No chest pain or shortness of breath. No headache or blurry vision.    Past Medical History-  Patient Active Problem List   Diagnosis Date Noted  . Hyperglycemia 03/29/2015    Priority: Medium  . Hyperlipidemia 10/18/2014    Priority: Medium  . Essential hypertension 03/11/2007    Priority: Medium  . History of adenomatous polyp of colon 07/23/2015    Priority: Low  . Raynaud phenomenon 10/19/2014    Priority: Low  . Syncope 10/19/2014    Priority: Low  . Glaucoma 01/05/2011    Priority: Low  . Osteopenia 03/11/2007    Priority: Low    Medications- reviewed and updated Current Outpatient Prescriptions  Medication Sig Dispense Refill  . ALPHAGAN P 0.1 % SOLN Apply 1 drop topically daily. Apply 2 drops in left eye and three in right daily    . amLODipine (NORVASC) 5 MG tablet Take 1 tablet (5 mg total) by mouth daily. 90 tablet 3  . aspirin EC 81 MG tablet Take 81 mg by mouth daily.    . bimatoprost (LUMIGAN) 0.03 % ophthalmic solution Place 1 drop into both eyes at bedtime.    . brimonidine-timolol (COMBIGAN) 0.2-0.5 % ophthalmic solution Place 1 drop into both eyes every 12 (twelve) hours.    . Cholecalciferol (VITAMIN D3) 1000 UNITS CAPS Take by mouth daily.      Marland Kitchen co-enzyme Q-10 50 MG capsule Take 50 mg by mouth daily.      . Magnesium Oxide 500 MG TABS Take 1 tablet by mouth daily.      . Multiple Vitamins-Minerals (MULTIVITAMIN WITH MINERALS) tablet Take 1 tablet by mouth daily.      Marland Kitchen omega-3 acid ethyl esters (LOVAZA) 1 g capsule Take 1 g by mouth daily.    Marland Kitchen Resveratrol 50 MG CAPS Take 1 capsule by mouth daily.     No current facility-administered medications for this visit.     Objective: BP (!) 162/96 (BP Location: Left Arm, Patient Position: Sitting,  Cuff Size: Normal)   Pulse 72   Temp 98.2 F (36.8 C) (Oral)   Ht 5' 3.25" (1.607 m)   Wt 123 lb 12.8 oz (56.2 kg)   SpO2 98%   BMI 21.76 kg/m  Gen: NAD, resting comfortably CV: RRR no murmurs rubs or gallops Lungs: CTAB no crackles, wheeze, rhonchi Left breast: 2 mm whitish papule. Very small incision at top of this and able to express cottage cheese like discharge without odor. No erythema surrounding lesion Ext: no edema Skin: warm, dry  I+D without anesthetic completed. Used 11 blade. Verbal precautions givne both pre and post procedure.   Assessment/Plan:  Nipple anomaly S: seen by radiology- had ultrasound and exam by physician there- thought very unlikely breast cancer- they advised attempted I+D A/P:I+D today yielded cottage cheese like discharge suggesting breast epidermal inclusion cyst- still odd location. We discussed if recurrent would refer to dermatology for excision instead of I+D.   Essential hypertension S: controlled on home readings with 120s/130s over 70s/80s and last visit 2 weeks ago went hom enad was 124/73 that afternoon.  BP Readings from Last 3 Encounters:  02/17/17 (!) 162/96  02/04/17 (!) 160/98  04/01/16 (!) 142/102  A/P:Continue current meds:  Plans to bring cuff again for verification at future visit  has June  visit for cpe- will plan to reexamine  Return precautions advised.  Garret Reddish, MD

## 2017-02-17 NOTE — Assessment & Plan Note (Signed)
S: controlled on home readings with 120s/130s over 70s/80s and last visit 2 weeks ago went hom enad was 124/73 that afternoon.  BP Readings from Last 3 Encounters:  02/17/17 (!) 162/96  02/04/17 (!) 160/98  04/01/16 (!) 142/102  A/P:Continue current meds:  Plans to bring cuff again for verification at future visit

## 2017-02-17 NOTE — Patient Instructions (Signed)
Thrilled with ultrasound results  This seems to be a small cyst. Did not appear infected  Could recur- send me a mychart message and would refer to dermatology as may need more excisional approach instead of incision and drainage approach  Great job on your part dealing with the incision! Let us know if fever or chills. If bleeding- apply pressure for up to 30 minutes- seek care if lasts over an hour (doubt this willhappen) if expanding redness around the site also let us know

## 2017-02-23 DIAGNOSIS — R69 Illness, unspecified: Secondary | ICD-10-CM | POA: Diagnosis not present

## 2017-03-26 ENCOUNTER — Other Ambulatory Visit (INDEPENDENT_AMBULATORY_CARE_PROVIDER_SITE_OTHER): Payer: Medicare HMO

## 2017-03-26 DIAGNOSIS — E785 Hyperlipidemia, unspecified: Secondary | ICD-10-CM

## 2017-03-26 DIAGNOSIS — R739 Hyperglycemia, unspecified: Secondary | ICD-10-CM

## 2017-03-26 LAB — POC URINALSYSI DIPSTICK (AUTOMATED)
Bilirubin, UA: NEGATIVE
Blood, UA: NEGATIVE
Glucose, UA: NEGATIVE
KETONES UA: NEGATIVE
LEUKOCYTES UA: NEGATIVE
Nitrite, UA: NEGATIVE
PH UA: 7 (ref 5.0–8.0)
PROTEIN UA: NEGATIVE
SPEC GRAV UA: 1.015 (ref 1.010–1.025)
UROBILINOGEN UA: 0.2 U/dL

## 2017-03-26 LAB — CBC WITH DIFFERENTIAL/PLATELET
BASOS ABS: 0 10*3/uL (ref 0.0–0.1)
BASOS PCT: 1.4 % (ref 0.0–3.0)
Eosinophils Absolute: 0.1 10*3/uL (ref 0.0–0.7)
Eosinophils Relative: 2 % (ref 0.0–5.0)
HEMATOCRIT: 36.8 % (ref 36.0–46.0)
HEMOGLOBIN: 12.3 g/dL (ref 12.0–15.0)
LYMPHS PCT: 30.8 % (ref 12.0–46.0)
Lymphs Abs: 0.9 10*3/uL (ref 0.7–4.0)
MCHC: 33.4 g/dL (ref 30.0–36.0)
MCV: 96.7 fl (ref 78.0–100.0)
MONO ABS: 0.3 10*3/uL (ref 0.1–1.0)
Monocytes Relative: 8.8 % (ref 3.0–12.0)
Neutro Abs: 1.7 10*3/uL (ref 1.4–7.7)
Neutrophils Relative %: 57 % (ref 43.0–77.0)
Platelets: 227 10*3/uL (ref 150.0–400.0)
RBC: 3.81 Mil/uL — ABNORMAL LOW (ref 3.87–5.11)
RDW: 14.1 % (ref 11.5–15.5)
WBC: 2.9 10*3/uL — AB (ref 4.0–10.5)

## 2017-03-26 LAB — LIPID PANEL
CHOLESTEROL: 181 mg/dL (ref 0–200)
HDL: 59.3 mg/dL (ref >39.00)
LDL Cholesterol: 103 mg/dL — ABNORMAL HIGH (ref 0–99)
NONHDL: 121.56
Total CHOL/HDL Ratio: 3
Triglycerides: 91 mg/dL (ref 0.0–149.0)
VLDL: 18.2 mg/dL (ref 0.0–40.0)

## 2017-03-26 LAB — COMPREHENSIVE METABOLIC PANEL
ALBUMIN: 4.4 g/dL (ref 3.5–5.2)
ALK PHOS: 43 U/L (ref 39–117)
ALT: 14 U/L (ref 0–35)
AST: 14 U/L (ref 0–37)
BILIRUBIN TOTAL: 0.6 mg/dL (ref 0.2–1.2)
BUN: 16 mg/dL (ref 6–23)
CO2: 29 mEq/L (ref 19–32)
CREATININE: 0.78 mg/dL (ref 0.40–1.20)
Calcium: 9.6 mg/dL (ref 8.4–10.5)
Chloride: 105 mEq/L (ref 96–112)
GFR: 76.3 mL/min (ref >60.00)
GLUCOSE: 114 mg/dL — AB (ref 70–99)
Potassium: 4.6 mEq/L (ref 3.5–5.1)
SODIUM: 141 meq/L (ref 135–145)
TOTAL PROTEIN: 7 g/dL (ref 6.0–8.3)

## 2017-03-26 LAB — HEMOGLOBIN A1C: HEMOGLOBIN A1C: 6.2 % (ref 4.6–6.5)

## 2017-04-02 ENCOUNTER — Encounter: Payer: Self-pay | Admitting: Family Medicine

## 2017-04-02 ENCOUNTER — Ambulatory Visit (INDEPENDENT_AMBULATORY_CARE_PROVIDER_SITE_OTHER): Payer: Medicare HMO | Admitting: Family Medicine

## 2017-04-02 VITALS — BP 156/98 | HR 75 | Temp 98.5°F | Ht 63.75 in | Wt 121.2 lb

## 2017-04-02 DIAGNOSIS — R739 Hyperglycemia, unspecified: Secondary | ICD-10-CM | POA: Diagnosis not present

## 2017-04-02 DIAGNOSIS — Z0000 Encounter for general adult medical examination without abnormal findings: Secondary | ICD-10-CM

## 2017-04-02 DIAGNOSIS — I10 Essential (primary) hypertension: Secondary | ICD-10-CM | POA: Diagnosis not present

## 2017-04-02 DIAGNOSIS — M8588 Other specified disorders of bone density and structure, other site: Secondary | ICD-10-CM

## 2017-04-02 DIAGNOSIS — E785 Hyperlipidemia, unspecified: Secondary | ICD-10-CM | POA: Diagnosis not present

## 2017-04-02 MED ORDER — AMLODIPINE BESYLATE 5 MG PO TABS: 5.0000 mg | ORAL_TABLET | Freq: Every day | ORAL | 3 refills | Status: DC

## 2017-04-02 NOTE — Assessment & Plan Note (Signed)
Hypertension- controlled on amlodipine 5mg  at home with excellent #s- averaging about 130/70 if not slightly below that. Home cuff verfied last CPE

## 2017-04-02 NOTE — Progress Notes (Signed)
Phone: [phone]  Subjective:  Patient presents today for their annual physical. Chief complaint-noted.   See problem oriented charting- ROS- full  review of systems was completed and negative except for: vision issues that has been working with optho. No headaches. No chest pain or shortness of breath  The following were reviewed and entered/updated in epic: Past Medical History:  Diagnosis Date  . Glaucoma   . HYPERTENSION 03/11/2007  . OSTEOPENIA 03/11/2007   Patient Active Problem List   Diagnosis Date Noted  . Hyperglycemia 03/29/2015    Priority: Medium  . Hyperlipidemia 10/18/2014    Priority: Medium  . Essential hypertension 03/11/2007    Priority: Medium  . History of adenomatous polyp of colon 07/23/2015    Priority: Low  . Raynaud phenomenon 10/19/2014    Priority: Low  . Syncope 10/19/2014    Priority: Low  . Glaucoma 01/05/2011    Priority: Low  . Osteopenia 03/11/2007    Priority: Low   Past Surgical History:  Procedure Laterality Date  . BREAST EXCISIONAL BIOPSY Right 2000  . BREAST LUMPECTOMY  1990   benign  . DILATION AND CURETTAGE OF UTERUS     bleeding at menopause. No uterine cancer  . TONSILLECTOMY     age 72    Family History  Problem Relation Age of Onset  . Heart disease Mother        CHF mother died 89  . Arthritis Mother   . Glaucoma Mother        sister as well  . Alcohol abuse Father   . Cancer Sister        lung cancer, smoker  . Heart disease Sister        aortic valve replacement  . Hyperlipidemia Brother   . Hypertension Brother   . Arthritis Sister   . Hypertension Sister   . Colon cancer Neg Hx   . Colon polyps Neg Hx     Medications- reviewed and updated Current Outpatient Prescriptions  Medication Sig Dispense Refill  . ALPHAGAN P 0.1 % SOLN Apply 1 drop topically daily. Apply 2 drops in left eye    . amLODipine (NORVASC) 5 MG tablet Take 1 tablet (5 mg total) by mouth daily. 90 tablet 3  . aspirin EC 81 MG  tablet Take 81 mg by mouth daily.    . bimatoprost (LUMIGAN) 0.03 % ophthalmic solution Place 1 drop into both eyes at bedtime.    . Cholecalciferol (VITAMIN D3) 1000 UNITS CAPS Take by mouth daily.      Marland Kitchen co-enzyme Q-10 50 MG capsule Take 50 mg by mouth daily.      . dorzolamide-timolol (COSOPT) 22.3-6.8 MG/ML ophthalmic solution Place 1 drop into both eyes 2 (two) times daily.  11  . Magnesium Oxide 500 MG TABS Take 1 tablet by mouth daily.      . Multiple Vitamins-Minerals (MULTIVITAMIN WITH MINERALS) tablet Take 1 tablet by mouth daily.      Marland Kitchen omega-3 acid ethyl esters (LOVAZA) 1 g capsule Take 1 g by mouth daily.    Marland Kitchen Resveratrol 50 MG CAPS Take 1 capsule by mouth daily.     No current facility-administered medications for this visit.     Allergies-reviewed and updated Allergies  Allergen Reactions  . Ace Inhibitors   . Diamox [Acetazolamide]     Hypotensive event at ophthalmologist  . Penicillins     REACTION: rash  . Sulfamethoxazole     REACTION: rash    Social History  Social History  . Marital status: Married    Spouse name: N/A  . Number of children: N/A  . Years of education: N/A   Social History Main Topics  . Smoking status: Former Smoker    Packs/day: 0.50    Years: 7.00    Types: Cigarettes    Quit date: 01/04/1961  . Smokeless tobacco: Never Used  . Alcohol use 0.6 oz/week    1 Standard drinks or equivalent per week  . Drug use: No  . Sexual activity: Not Asked   Other Topics Concern  . None   Social History Narrative   Married. Lives with husband (patient of Dr. Yong Channel). 1 son. No grandkids. 1 granddog.       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 Oropharynx normal Neck: no thyromegaly CV: RRR no murmurs rubs or gallops Lungs: CTAB no crackles, wheeze, rhonchi Abdomen: soft/nontender/nondistended/normal bowel sounds. No rebound or guarding.  Ext: no edema Skin: warm, dry Neuro: grossly normal, moves all extremities, PERRLA  Well healed nipple on the left  Assessment/Plan:  76 y.o. female presenting for annual physical.  Health Maintenance counseling: 1. Anticipatory guidance: Patient counseled regarding regular dental exams q6 months, eye exams - super regularly at present, wearing seatbelts.  2. Risk factor reduction:  Advised patient of need for regular exercise and diet rich and fruits and vegetables to reduce risk of heart attack and stroke. Exercise- has been down recently for 3 months with eye issues- off the gym 2x a week, still mostly doing regular walking. Diet-reasonable- we had discussed last year avoiding further weigh tloss and weight has stabilized.  Wt Readings from Last 3 Encounters:  04/02/17 121 lb 3.2 oz (55 kg)  02/17/17 123 lb 12.8 oz (56.2 kg)  02/04/17 122 lb 6.4 oz (55.5 kg)  3. Immunizations/screenings/ancillary studies- discussed shingrix and shortage issues Immunization History  Administered Date(s) Administered  . Influenza Split 07/19/2012  . Influenza Whole 07/17/2008, 06/28/2009, 06/25/2010  . Influenza, High Dose Seasonal PF 07/22/2016  . Influenza,inj,Quad PF,36+ Mos 06/27/2013, 06/18/2014  . Influenza-Unspecified 07/03/2015  . Pneumococcal Conjugate-13 03/29/2015  . Pneumococcal Polysaccharide-23 04/04/2006  . Td 02/10/2010  . Zoster 09/26/2008  4. Cervical cancer screening- aged out and no history abnormal pap 5. Breast cancer screening-  breast exam for nipple anomoly only and mammogram 02/05/17 6. Colon cancer screening - 07/2015 adenoma with 5 year repeat likely 7. Skin  cancer screening- denies skin concerns. Uses sunscreen regularly when out  Status of chronic or acute concerns   Hyperlipidemia Hyperlipidemia- 10 year risk based off BP today is 32.6%. Based on home BPs 23.9%. Just started back to the gym this week- had 3 months off due to optho follow up and surgery Lab Results  Component Value Date   CHOL 181 03/26/2017   HDL 59.30 03/26/2017   LDLCALC 103 (H) 03/26/2017   TRIG 91.0 03/26/2017   CHOLHDL 3 03/26/2017  she firmly declines statin. She is worried about diabetes  risk and recognizes that her #s are not far off target  Hyperglycemia Hyperglycemia- elevated risk this year with a1c 6.2. Prefers to hold off on metformin. Is going to cut down on chips and start exercising again -advised 6 month check in and can do POC a1c  Essential hypertension Hypertension- controlled on amlodipine 5mg  at home with excellent #s- averaging about 130/70 if not slightly below that. Home cuff verfied last CPE  Osteopenia Osteopenia- had been on fosamax 3-4 years in past but came off due to SE. Agrees to updating dexa but wants to wait after eye surgeries- she will let us know.  Ap spine -2.3   Return in about 6 months (around 10/02/2017) for follow up- or sooner if needed.  Meds ordered this encounter  Medications  . dorzolamide-timolol (COSOPT) 22.3-6.8 MG/ML ophthalmic solution    Sig: Place 1 drop into both eyes 2 (two) times daily.    Refill:  11  . amLODipine (NORVASC) 5 MG tablet    Sig: Take 1 tablet (5 mg total) by mouth daily.    Dispense:  90 tablet    Refill:  3   Return precautions advised.  Garret Reddish, MD

## 2017-04-02 NOTE — Assessment & Plan Note (Signed)
Osteopenia- had been on fosamax 3-4 years in past but came off due to [place]. Agrees to updating dexa but wants to wait after eye surgeries- she will let us know.  Ap spine -2.3

## 2017-04-02 NOTE — Patient Instructions (Addendum)
advised 6 month check in and can do finger prick a1c test  Please contact me when ready for bone density and we will order this for you (could also just order this in 6 months)  Goal is for home blood pressures to be <140/90- no changes in meds as long as you remain under this

## 2017-04-02 NOTE — Assessment & Plan Note (Signed)
Hyperlipidemia- 10 year risk based off BP today is 32.6%. Based on home BPs 23.9%. Just started back to the gym this week- had 3 months off due to optho follow up and surgery Lab Results  Component Value Date   CHOL 181 03/26/2017   HDL 59.30 03/26/2017   LDLCALC 103 (H) 03/26/2017   TRIG 91.0 03/26/2017   CHOLHDL 3 03/26/2017  she firmly declines statin. She is worried about diabetes risk and recognizes that her #s are not far off target

## 2017-04-02 NOTE — Assessment & Plan Note (Signed)
Hyperglycemia- elevated risk this year with a1c 6.2. Prefers to hold off on metformin. Is going to cut down on chips and start exercising again -advised 6 month check in and can do POC a1c

## 2017-04-12 DIAGNOSIS — H4053X1 Glaucoma secondary to other eye disorders, bilateral, mild stage: Secondary | ICD-10-CM | POA: Diagnosis not present

## 2017-04-28 DIAGNOSIS — H401124 Primary open-angle glaucoma, left eye, indeterminate stage: Secondary | ICD-10-CM | POA: Diagnosis not present

## 2017-04-28 DIAGNOSIS — H25812 Combined forms of age-related cataract, left eye: Secondary | ICD-10-CM | POA: Diagnosis not present

## 2017-04-28 DIAGNOSIS — H401114 Primary open-angle glaucoma, right eye, indeterminate stage: Secondary | ICD-10-CM | POA: Diagnosis not present

## 2017-06-16 DIAGNOSIS — H2512 Age-related nuclear cataract, left eye: Secondary | ICD-10-CM | POA: Diagnosis not present

## 2017-06-16 DIAGNOSIS — Z961 Presence of intraocular lens: Secondary | ICD-10-CM | POA: Diagnosis not present

## 2017-06-24 ENCOUNTER — Ambulatory Visit (INDEPENDENT_AMBULATORY_CARE_PROVIDER_SITE_OTHER): Payer: Medicare HMO

## 2017-06-24 DIAGNOSIS — Z23 Encounter for immunization: Secondary | ICD-10-CM | POA: Diagnosis not present

## 2017-07-30 DIAGNOSIS — H401134 Primary open-angle glaucoma, bilateral, indeterminate stage: Secondary | ICD-10-CM | POA: Diagnosis not present

## 2017-08-12 ENCOUNTER — Telehealth: Payer: Self-pay | Admitting: Family Medicine

## 2017-08-12 DIAGNOSIS — H401124 Primary open-angle glaucoma, left eye, indeterminate stage: Secondary | ICD-10-CM | POA: Diagnosis not present

## 2017-08-12 DIAGNOSIS — Z882 Allergy status to sulfonamides status: Secondary | ICD-10-CM | POA: Diagnosis not present

## 2017-08-12 DIAGNOSIS — Z888 Allergy status to other drugs, medicaments and biological substances status: Secondary | ICD-10-CM | POA: Diagnosis not present

## 2017-08-12 DIAGNOSIS — Z880 Allergy status to penicillin: Secondary | ICD-10-CM | POA: Diagnosis not present

## 2017-08-12 DIAGNOSIS — H2512 Age-related nuclear cataract, left eye: Secondary | ICD-10-CM | POA: Diagnosis not present

## 2017-08-12 NOTE — Telephone Encounter (Signed)
[facility] called in reference to wanting to know if patient can stop taking aspirin EC 81 MG tablet 7 days prior to cataract and glycoma surgery on 08/17/17. [facility] will be faxing over form for this and would like this back ASAP.  Please advise.

## 2017-08-13 NOTE — Telephone Encounter (Signed)
[facility] called again to check the status of the note below. The patient has surgery on Tuesday so they need an update before the end of the day today. Please advise.

## 2017-08-13 NOTE — Telephone Encounter (Signed)
Patient called in reference to notes below. Patient just wanted to be sure she is called and informed in addition to calling and informing the PA.

## 2017-08-13 NOTE — Telephone Encounter (Signed)
Patient calling to check on status of note below. Patient stated it is ok to call PA at [facility] at [phone] if it is after 4pm today.  Please advise.

## 2017-08-13 NOTE — Telephone Encounter (Signed)
May hold aspirin for surgery. Called # given and there is no answer and no identifying information on voicemail. I never got this form- perhaps sending to [place]? Asked patient to have optho look on care everywhere since they are on epic to view this message

## 2017-08-17 DIAGNOSIS — M8580 Other specified disorders of bone density and structure, unspecified site: Secondary | ICD-10-CM | POA: Diagnosis not present

## 2017-08-17 DIAGNOSIS — I7300 Raynaud's syndrome without gangrene: Secondary | ICD-10-CM | POA: Diagnosis not present

## 2017-08-17 DIAGNOSIS — I10 Essential (primary) hypertension: Secondary | ICD-10-CM | POA: Diagnosis not present

## 2017-08-17 DIAGNOSIS — J309 Allergic rhinitis, unspecified: Secondary | ICD-10-CM | POA: Diagnosis not present

## 2017-08-17 DIAGNOSIS — H21542 Posterior synechiae (iris), left eye: Secondary | ICD-10-CM | POA: Diagnosis not present

## 2017-08-17 DIAGNOSIS — H402222 Chronic angle-closure glaucoma, left eye, moderate stage: Secondary | ICD-10-CM | POA: Diagnosis not present

## 2017-08-17 DIAGNOSIS — H401122 Primary open-angle glaucoma, left eye, moderate stage: Secondary | ICD-10-CM | POA: Diagnosis not present

## 2017-08-17 DIAGNOSIS — Z87891 Personal history of nicotine dependence: Secondary | ICD-10-CM | POA: Diagnosis not present

## 2017-08-17 DIAGNOSIS — H2512 Age-related nuclear cataract, left eye: Secondary | ICD-10-CM | POA: Diagnosis not present

## 2017-08-17 DIAGNOSIS — E785 Hyperlipidemia, unspecified: Secondary | ICD-10-CM | POA: Diagnosis not present

## 2017-08-18 DIAGNOSIS — H401122 Primary open-angle glaucoma, left eye, moderate stage: Secondary | ICD-10-CM | POA: Diagnosis not present

## 2017-08-18 DIAGNOSIS — Z48810 Encounter for surgical aftercare following surgery on the sense organs: Secondary | ICD-10-CM | POA: Diagnosis not present

## 2017-08-18 DIAGNOSIS — H401114 Primary open-angle glaucoma, right eye, indeterminate stage: Secondary | ICD-10-CM | POA: Diagnosis not present

## 2017-08-27 DIAGNOSIS — Z48810 Encounter for surgical aftercare following surgery on the sense organs: Secondary | ICD-10-CM | POA: Diagnosis not present

## 2017-08-27 DIAGNOSIS — H401122 Primary open-angle glaucoma, left eye, moderate stage: Secondary | ICD-10-CM | POA: Diagnosis not present

## 2017-08-27 DIAGNOSIS — H401114 Primary open-angle glaucoma, right eye, indeterminate stage: Secondary | ICD-10-CM | POA: Diagnosis not present

## 2017-09-02 DIAGNOSIS — H401122 Primary open-angle glaucoma, left eye, moderate stage: Secondary | ICD-10-CM | POA: Diagnosis not present

## 2017-09-02 DIAGNOSIS — H401114 Primary open-angle glaucoma, right eye, indeterminate stage: Secondary | ICD-10-CM | POA: Diagnosis not present

## 2017-09-02 DIAGNOSIS — Z48810 Encounter for surgical aftercare following surgery on the sense organs: Secondary | ICD-10-CM | POA: Diagnosis not present

## 2017-09-17 ENCOUNTER — Encounter: Payer: Self-pay | Admitting: Family Medicine

## 2017-09-17 ENCOUNTER — Ambulatory Visit (INDEPENDENT_AMBULATORY_CARE_PROVIDER_SITE_OTHER): Payer: Medicare HMO | Admitting: Family Medicine

## 2017-09-17 VITALS — BP 150/92 | HR 73 | Temp 97.7°F | Ht 63.75 in | Wt 115.4 lb

## 2017-09-17 DIAGNOSIS — I10 Essential (primary) hypertension: Secondary | ICD-10-CM | POA: Diagnosis not present

## 2017-09-17 DIAGNOSIS — R739 Hyperglycemia, unspecified: Secondary | ICD-10-CM

## 2017-09-17 LAB — POCT GLYCOSYLATED HEMOGLOBIN (HGB A1C): Hemoglobin A1C: 5.9

## 2017-09-17 NOTE — Assessment & Plan Note (Signed)
S: controlled on amlodipine 5mg  at home- excellent #s about 130/70 still. Home cuff has been verified.  BP Readings from Last 3 Encounters:  09/17/17 (!) 150/92  04/02/17 (!) 156/98  02/17/17 (!) 162/96  A/P: We discussed blood pressure goal of <140/90- at goal at home. White coat element in office. Continue current meds

## 2017-09-17 NOTE — Patient Instructions (Signed)
Glad blood pressure looks good at home  Lab Results  Component Value Date   HGBA1C 5.9 09/17/2017  down from 6.2! Now you can enjoy those Christmas Cookies- in general I want to maintain level 6 or below but without further weight loss- in fact some weight gain could be beneficial for you

## 2017-09-17 NOTE — Assessment & Plan Note (Addendum)
S: has wanted to remain off metformin. She has cut sweets- she really should not be losing weight but with cutting junk food is down 6 lbs.  Missing gym for 8 weeks from eye surgery unfortunately- she enjoys the gym Lab Results  Component Value Date   HGBA1C 6.2 03/26/2017  A/P: update a1c today. Down to 5.9! Great news. Still want it to remain at this level with slight weight gain.

## 2017-09-17 NOTE — Progress Notes (Signed)
Subjective:  Megan Olson is a 76 y.o. year old very pleasant female patient who presents for/with See problem oriented charting ROS- no hypoglycemia. No edema. No chest pain or shortness of breath.    Past Medical History-  Patient Active Problem List   Diagnosis Date Noted  . Hyperglycemia 03/29/2015    Priority: Medium  . Hyperlipidemia 10/18/2014    Priority: Medium  . Essential hypertension 03/11/2007    Priority: Medium  . History of adenomatous polyp of colon 07/23/2015    Priority: Low  . Raynaud phenomenon 10/19/2014    Priority: Low  . Syncope 10/19/2014    Priority: Low  . Glaucoma 01/05/2011    Priority: Low  . Osteopenia 03/11/2007    Priority: Low    Medications- reviewed and updated Current Outpatient Medications  Medication Sig Dispense Refill  . ALPHAGAN P 0.1 % SOLN Apply 1 drop topically daily. Apply 2 drops in left eye    . amLODipine (NORVASC) 5 MG tablet Take 1 tablet (5 mg total) by mouth daily. 90 tablet 3  . aspirin EC 81 MG tablet Take 81 mg by mouth daily.    . bimatoprost (LUMIGAN) 0.03 % ophthalmic solution Place 1 drop into both eyes at bedtime.    . Cholecalciferol (VITAMIN D3) 1000 UNITS CAPS Take by mouth daily.      Marland Kitchen co-enzyme Q-10 50 MG capsule Take 50 mg by mouth daily.      . dorzolamide-timolol (COSOPT) 22.3-6.8 MG/ML ophthalmic solution Place 1 drop into both eyes 2 (two) times daily.  11  . Magnesium Oxide 500 MG TABS Take 1 tablet by mouth daily.      . Multiple Vitamins-Minerals (MULTIVITAMIN WITH MINERALS) tablet Take 1 tablet by mouth daily.      Marland Kitchen omega-3 acid ethyl esters (LOVAZA) 1 g capsule Take 1 g by mouth daily.    Marland Kitchen Resveratrol 50 MG CAPS Take 1 capsule by mouth daily.     No current facility-administered medications for this visit.     Objective: BP (!) 150/92 (BP Location: Left Arm, Patient Position: Sitting, Cuff Size: Large)   Pulse 73   Temp 97.7 F (36.5 C) (Oral)   Ht 5' 3.75" (1.619 m)   Wt 115 lb 6.4 oz  (52.3 kg)   SpO2 97%   BMI 19.96 kg/m  Gen: NAD, resting comfortably CV: RRR no murmurs rubs or gallops Lungs: CTAB no crackles, wheeze, rhonchi Abdomen: thin Ext: no edema Skin: warm, dry   Assessment/Plan:  Essential hypertension S: controlled on amlodipine 5mg  at home- excellent #s about 130/70 still. Home cuff has been verified.  BP Readings from Last 3 Encounters:  09/17/17 (!) 150/92  04/02/17 (!) 156/98  02/17/17 (!) 162/96  A/P: We discussed blood pressure goal of <140/90- at goal at home. White coat element in office. Continue current meds  Hyperglycemia S: has wanted to remain off metformin. She has cut sweets- she really should not be losing weight but with cutting junk food is down 6 lbs.  Missing gym for 8 weeks from eye surgery unfortunately- she enjoys the gym Lab Results  Component Value Date   HGBA1C 6.2 03/26/2017  A/P: update a1c today. Down to 5.9! Great news. Still want it to remain at this level with slight weight gain.    Future Appointments  Date Time Provider [place]  04/04/2018  8:15 AM [name], MD [facility]  already scheduled for CPE  Orders Placed This Encounter  Procedures  .  POCT glycosylated hemoglobin (Hb A1C)   Return precautions advised.  Garret Reddish, MD

## 2017-10-11 DIAGNOSIS — H401112 Primary open-angle glaucoma, right eye, moderate stage: Secondary | ICD-10-CM | POA: Insufficient documentation

## 2017-11-12 DIAGNOSIS — Z9842 Cataract extraction status, left eye: Secondary | ICD-10-CM | POA: Diagnosis not present

## 2017-12-02 DIAGNOSIS — H401122 Primary open-angle glaucoma, left eye, moderate stage: Secondary | ICD-10-CM | POA: Diagnosis not present

## 2017-12-02 DIAGNOSIS — H401112 Primary open-angle glaucoma, right eye, moderate stage: Secondary | ICD-10-CM | POA: Diagnosis not present

## 2017-12-28 DIAGNOSIS — R69 Illness, unspecified: Secondary | ICD-10-CM | POA: Diagnosis not present

## 2017-12-30 DIAGNOSIS — H401122 Primary open-angle glaucoma, left eye, moderate stage: Secondary | ICD-10-CM | POA: Diagnosis not present

## 2017-12-30 DIAGNOSIS — H401112 Primary open-angle glaucoma, right eye, moderate stage: Secondary | ICD-10-CM | POA: Diagnosis not present

## 2018-01-03 DIAGNOSIS — Z0100 Encounter for examination of eyes and vision without abnormal findings: Secondary | ICD-10-CM | POA: Diagnosis not present

## 2018-01-03 DIAGNOSIS — Z961 Presence of intraocular lens: Secondary | ICD-10-CM | POA: Diagnosis not present

## 2018-01-06 ENCOUNTER — Other Ambulatory Visit: Payer: Self-pay | Admitting: Family Medicine

## 2018-01-06 DIAGNOSIS — Z139 Encounter for screening, unspecified: Secondary | ICD-10-CM

## 2018-02-08 ENCOUNTER — Ambulatory Visit
Admission: RE | Admit: 2018-02-08 | Discharge: 2018-02-08 | Disposition: A | Discharge status: Home / Self Care | Payer: Medicare HMO | Source: Ambulatory Visit | Attending: Family Medicine | Admitting: Family Medicine

## 2018-02-08 DIAGNOSIS — Z1231 Encounter for screening mammogram for malignant neoplasm of breast: Secondary | ICD-10-CM | POA: Diagnosis not present

## 2018-02-08 DIAGNOSIS — Z139 Encounter for screening, unspecified: Secondary | ICD-10-CM

## 2018-04-04 ENCOUNTER — Ambulatory Visit (INDEPENDENT_AMBULATORY_CARE_PROVIDER_SITE_OTHER): Payer: Medicare HMO | Admitting: Family Medicine

## 2018-04-04 ENCOUNTER — Encounter: Payer: Self-pay | Admitting: Family Medicine

## 2018-04-04 VITALS — BP 138/62 | HR 63 | Temp 97.8°F | Ht 63.75 in | Wt 119.0 lb

## 2018-04-04 DIAGNOSIS — I10 Essential (primary) hypertension: Secondary | ICD-10-CM | POA: Diagnosis not present

## 2018-04-04 DIAGNOSIS — Z0000 Encounter for general adult medical examination without abnormal findings: Secondary | ICD-10-CM | POA: Diagnosis not present

## 2018-04-04 DIAGNOSIS — R739 Hyperglycemia, unspecified: Secondary | ICD-10-CM

## 2018-04-04 DIAGNOSIS — E785 Hyperlipidemia, unspecified: Secondary | ICD-10-CM | POA: Diagnosis not present

## 2018-04-04 DIAGNOSIS — M8588 Other specified disorders of bone density and structure, other site: Secondary | ICD-10-CM

## 2018-04-04 LAB — COMPREHENSIVE METABOLIC PANEL
ALBUMIN: 4.2 g/dL (ref 3.5–5.2)
ALK PHOS: 49 U/L (ref 39–117)
ALT: 12 U/L (ref 0–35)
AST: 12 U/L (ref 0–37)
BILIRUBIN TOTAL: 0.5 mg/dL (ref 0.2–1.2)
BUN: 13 mg/dL (ref 6–23)
CO2: 30 mEq/L (ref 19–32)
Calcium: 9.2 mg/dL (ref 8.4–10.5)
Chloride: 104 mEq/L (ref 96–112)
Creatinine, Ser: 0.68 mg/dL (ref 0.40–1.20)
GFR: 89.15 mL/min (ref >60.00)
GLUCOSE: 112 mg/dL — AB (ref 70–99)
Potassium: 4.4 mEq/L (ref 3.5–5.1)
Sodium: 141 mEq/L (ref 135–145)
TOTAL PROTEIN: 7.5 g/dL (ref 6.0–8.3)

## 2018-04-04 LAB — CBC
HCT: 36.8 % (ref 36.0–46.0)
HEMOGLOBIN: 12.3 g/dL (ref 12.0–15.0)
MCHC: 33.3 g/dL (ref 30.0–36.0)
MCV: 99 fl (ref 78.0–100.0)
PLATELETS: 210 10*3/uL (ref 150.0–400.0)
RBC: 3.72 Mil/uL — ABNORMAL LOW (ref 3.87–5.11)
RDW: 14.6 % (ref 11.5–15.5)
WBC: 3.2 10*3/uL — AB (ref 4.0–10.5)

## 2018-04-04 LAB — LIPID PANEL
CHOLESTEROL: 182 mg/dL (ref 0–200)
HDL: 59.6 mg/dL (ref >39.00)
LDL Cholesterol: 100 mg/dL — ABNORMAL HIGH (ref 0–99)
NONHDL: 122.78
Total CHOL/HDL Ratio: 3
Triglycerides: 115 mg/dL (ref 0.0–149.0)
VLDL: 23 mg/dL (ref 0.0–40.0)

## 2018-04-04 LAB — HEMOGLOBIN A1C: HEMOGLOBIN A1C: 6.3 % (ref 4.6–6.5)

## 2018-04-04 MED ORDER — AMLODIPINE BESYLATE 5 MG PO TABS: 5.0000 mg | ORAL_TABLET | Freq: Every day | ORAL | 3 refills | Status: DC

## 2018-04-04 NOTE — Assessment & Plan Note (Signed)
Hyperglycemia- elevated risk this year with a1c 6.2. Prefers to hold off on metformin. I will update a1c with labs today. She thinks this could be up slightly.  Lab Results  Component Value Date   HGBA1C 5.9 09/17/2017   HGBA1C 6.2 03/26/2017   HGBA1C 5.9 03/22/2015

## 2018-04-04 NOTE — Progress Notes (Signed)
At risk for diabetes with hemoglobin a1c of 6.3 up from 5.9 six months ago (at risk from 5.7-6.4). Healthy eating, regular exercise should be continued. Since you dont want to start metformin, I recommend 6 month follow up to recheck a1c by fingerprick most likely (team please schedule her for follow up)  Your CBC was stable (blood counts, infection fighting cells, platelets) with slightly low white blood cells as per your normal Your CMET was normal (kidney, liver, and electrolytes, blood sugar) except for blood sugar in prediabetes range Your cholesterol has improved with bad cholesterol down to 100 and goal under 100 so you are right there- we can remain off the medicine and continue to work on healthier eating

## 2018-04-04 NOTE — Patient Instructions (Addendum)
I would also like for you to sign up for an annual wellness visit with one of our nurses, Cassie or Manuela Schwartz, who both specialize in the annual wellness visit. This is a free benefit under medicare that may help [place] find additional ways to help you. Some highlights are reviewing medications, lifestyle, and doing a dementia screen.  Please check with your pharmacy to see if they have the shingrix vaccine. If they do- please get this immunization and update [place] by phone call or mychart with dates you receive the vaccine  Please stop by the lab before you go.

## 2018-04-04 NOTE — Assessment & Plan Note (Signed)
Osteopenia- had been on fosamax 3-5years in past but came off due to SE. Agrees to updating dexa next year- wants her eye exams to get back to q6 months. In past, Ap spine -2.3

## 2018-04-04 NOTE — Progress Notes (Signed)
Phone: [phone]  Subjective:  Patient presents today for their annual physical. Chief complaint-noted.   See problem oriented charting- ROS- full  review of systems was completed and negative except for: occasional runny nose, sinus pressure, sneezing  The following were reviewed and entered/updated in epic: Past Medical History:  Diagnosis Date  . Glaucoma   . HYPERTENSION 03/11/2007  . OSTEOPENIA 03/11/2007   Patient Active Problem List   Diagnosis Date Noted  . Hyperglycemia 03/29/2015    Priority: Medium  . Hyperlipidemia 10/18/2014    Priority: Medium  . Essential hypertension 03/11/2007    Priority: Medium  . History of adenomatous polyp of colon 07/23/2015    Priority: Low  . Raynaud phenomenon 10/19/2014    Priority: Low  . Syncope 10/19/2014    Priority: Low  . Glaucoma 01/05/2011    Priority: Low  . Osteopenia 03/11/2007    Priority: Low   Past Surgical History:  Procedure Laterality Date  . BREAST EXCISIONAL BIOPSY Right 2000  . BREAST LUMPECTOMY  1990   benign  . DILATION AND CURETTAGE OF UTERUS     bleeding at menopause. No uterine cancer  . TONSILLECTOMY     age 41    Family History  Problem Relation Age of Onset  . Heart disease Mother        CHF mother died 67  . Arthritis Mother   . Glaucoma Mother        sister as well  . Alcohol abuse Father   . Cancer Sister        lung cancer, smoker  . Heart disease Sister        aortic valve replacement  . Hyperlipidemia Brother   . Hypertension Brother   . Arthritis Sister   . Hypertension Sister   . Colon cancer Neg Hx   . Colon polyps Neg Hx     Medications- reviewed and updated Current Outpatient Medications  Medication Sig Dispense Refill  . ALPHAGAN P 0.1 % SOLN Apply 1 drop topically daily. Apply 2 drops in right eye    . amLODipine (NORVASC) 5 MG tablet Take 1 tablet (5 mg total) by mouth daily. 90 tablet 3  . aspirin EC 81 MG tablet Take 81 mg by mouth daily.    . bimatoprost  (LUMIGAN) 0.03 % ophthalmic solution Place 1 drop into both eyes at bedtime.    . Cholecalciferol (VITAMIN D3) 1000 UNITS CAPS Take by mouth daily.      Marland Kitchen co-enzyme Q-10 50 MG capsule Take 50 mg by mouth daily.      . dorzolamide-timolol (COSOPT) 22.3-6.8 MG/ML ophthalmic solution Place 1 drop into both eyes 2 (two) times daily.  11  . Magnesium Oxide 500 MG TABS Take 1 tablet by mouth daily.      . Multiple Vitamins-Minerals (MULTIVITAMIN WITH MINERALS) tablet Take 1 tablet by mouth daily.      Marland Kitchen omega-3 acid ethyl esters (LOVAZA) 1 g capsule Take 1 g by mouth daily.    Marland Kitchen Resveratrol 50 MG CAPS Take 1 capsule by mouth daily.     No current facility-administered medications for this visit.     Allergies-reviewed and updated Allergies  Allergen Reactions  . Ace Inhibitors   . Diamox [Acetazolamide]     Hypotensive event at ophthalmologist  . Penicillins     REACTION: rash  . Sulfamethoxazole     REACTION: rash    Social History   Social History Narrative   Married. Lives with husband (  patient of Dr. Yong Channel). 1 son. No grandkids. 1 granddog.       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 Orophrynx normal Neck: no thyromegaly CV: RRR no murmurs rubs or gallops Lungs: CTAB no crackles, wheeze, rhonchi Abdomen: soft/nontender/nondistended/normal bowel sounds. No rebound or guarding.  Ext: no edema Skin: warm, dry Neuro: grossly normal, moves all extremities, PERRLA  Breasts: normal appearance, no masses or tenderness   Assessment/Plan:  77 y.o. female presenting for annual  physical.  Health Maintenance counseling: 1. Anticipatory guidance: Patient counseled regarding regular dental exams -q6 months, eye exams - now spaced to 3-4 months - hoping to get to 6 months, wearing seatbelts.  2. Risk factor reduction:  Advised patient of need for regular exercise and diet rich and fruits and vegetables to reduce risk of heart attack and stroke. Exercise- gym 2x a week, walking nightly as well. Diet-continues to eat pretty healthy diet- has cut down on junk food.  Wt Readings from Last 3 Encounters:  04/04/18 119 lb (54 kg)  09/17/17 115 lb 6.4 oz (52.3 kg)  04/02/17 121 lb 3.2 oz (55 kg)  3. Immunizations/screenings/ancillary studies- - We discussed shingrix availability issues  as well as coverage issues (part D medicare)- I recommended that patient get vaccine at the pharmacy  Immunization History  Administered Date(s) Administered  . Influenza Split 07/19/2012  . Influenza Whole 07/17/2008, 06/28/2009, 06/25/2010  . Influenza, High Dose Seasonal PF 07/22/2016, 06/24/2017  . Influenza,inj,Quad PF,6+ Mos 06/27/2013, 06/18/2014  . Influenza-Unspecified 07/03/2015  . Pneumococcal Conjugate-13 03/29/2015  . Pneumococcal Polysaccharide-23 04/04/2006  . Td 02/10/2010  . Zoster 09/26/2008  4. Cervical cancer screening- no history abnormal pap, plus has aged out of age based screening 5. Breast cancer screening-  breast exam today with prior nipple anomaly now improved  and mammogram 02/08/18- she would like to continue though we discussed guidelines through age 4. Colon cancer screening - 07/2015 with 5 year repeat likely 7. Skin cancer screening- skin screening in [place] through [facility]. advised regular sunscreen use. Denies worrisome, changing, or new skin lesions.  8. Birth control/STD check- postmenopausal/monogamous 9. Osteopenia follow up- see below 10. Former smoker- but quit 1962 and no urinary complaints- will skip urine  Status of chronic or acute concerns    Osteopenia Osteopenia- had been on fosamax 3-5years in past but came off due to SE. Agrees to updating dexa next year- wants her eye exams to get back to q6 months. In past, Ap spine -2.3  Hyperlipidemia Hyperlipidemia- 10 year risk based off BP today is 32.6% last year. Based on home BPs 23.9%. Last year, she firmly declined statin. She is worried about diabetes risk and recognizes that her #s are not far off target. Update lipids today. May want to stop aspirin if lipids looks good. Pressures averaging 125/70 on home #s.   Hyperglycemia Hyperglycemia- elevated risk this year with a1c 6.2. Prefers to hold off on metformin. I will update a1c with labs today. She thinks this could be up slightly.  Lab Results  Component Value Date   HGBA1C 5.9 09/17/2017   HGBA1C 6.2 03/26/2017   HGBA1C 5.9 03/22/2015     Essential hypertension Hypertension- controlled on amlodipine 5mg  at home with excellent #s still- Home cuff in past    No future appointments. No follow-ups on file.  Lab/Order associations: Hyperlipidemia, unspecified hyperlipidemia type - Plan: CBC, Comprehensive metabolic panel, Lipid panel  Hyperglycemia - Plan: Hemoglobin A1c  Meds ordered this encounter  Medications  . amLODipine (NORVASC) 5 MG tablet    Sig: Take 1 tablet (5 mg total) by mouth daily.    Dispense:  90 tablet    Refill:  3    Return precautions advised.  Garret Reddish, MD

## 2018-04-04 NOTE — Assessment & Plan Note (Signed)
Hypertension- controlled on amlodipine 5mg  at home with excellent #s still- Home cuff in past

## 2018-04-04 NOTE — Assessment & Plan Note (Signed)
Hyperlipidemia- 10 year risk based off BP today is 32.6% last year. Based on home BPs 23.9%. Last year, she firmly declined statin. She is worried about diabetes risk and recognizes that her #s are not far off target. Update lipids today. May want to stop aspirin if lipids looks good. Pressures averaging 125/70 on home #s.

## 2018-04-06 ENCOUNTER — Encounter: Payer: Self-pay | Admitting: Family Medicine

## 2018-04-18 DIAGNOSIS — H401112 Primary open-angle glaucoma, right eye, moderate stage: Secondary | ICD-10-CM | POA: Diagnosis not present

## 2018-04-18 DIAGNOSIS — H401122 Primary open-angle glaucoma, left eye, moderate stage: Secondary | ICD-10-CM | POA: Diagnosis not present

## 2018-05-17 ENCOUNTER — Encounter: Payer: Self-pay | Admitting: Family Medicine

## 2018-05-23 DIAGNOSIS — H401112 Primary open-angle glaucoma, right eye, moderate stage: Secondary | ICD-10-CM | POA: Diagnosis not present

## 2018-05-23 DIAGNOSIS — H401122 Primary open-angle glaucoma, left eye, moderate stage: Secondary | ICD-10-CM | POA: Diagnosis not present

## 2018-05-23 DIAGNOSIS — H401132 Primary open-angle glaucoma, bilateral, moderate stage: Secondary | ICD-10-CM | POA: Diagnosis not present

## 2018-06-21 DIAGNOSIS — Z961 Presence of intraocular lens: Secondary | ICD-10-CM | POA: Diagnosis not present

## 2018-06-21 DIAGNOSIS — H401122 Primary open-angle glaucoma, left eye, moderate stage: Secondary | ICD-10-CM | POA: Diagnosis not present

## 2018-06-21 DIAGNOSIS — I10 Essential (primary) hypertension: Secondary | ICD-10-CM | POA: Diagnosis not present

## 2018-06-21 DIAGNOSIS — Z79899 Other long term (current) drug therapy: Secondary | ICD-10-CM | POA: Diagnosis not present

## 2018-06-21 DIAGNOSIS — H401110 Primary open-angle glaucoma, right eye, stage unspecified: Secondary | ICD-10-CM | POA: Diagnosis not present

## 2018-06-22 DIAGNOSIS — Z48810 Encounter for surgical aftercare following surgery on the sense organs: Secondary | ICD-10-CM | POA: Diagnosis not present

## 2018-06-22 DIAGNOSIS — H401132 Primary open-angle glaucoma, bilateral, moderate stage: Secondary | ICD-10-CM | POA: Diagnosis not present

## 2018-06-22 DIAGNOSIS — Z961 Presence of intraocular lens: Secondary | ICD-10-CM | POA: Diagnosis not present

## 2018-06-23 ENCOUNTER — Encounter: Payer: Self-pay | Admitting: Family Medicine

## 2018-06-23 ENCOUNTER — Ambulatory Visit (INDEPENDENT_AMBULATORY_CARE_PROVIDER_SITE_OTHER): Payer: Medicare HMO

## 2018-06-23 ENCOUNTER — Ambulatory Visit: Payer: Medicare HMO

## 2018-06-23 DIAGNOSIS — Z23 Encounter for immunization: Secondary | ICD-10-CM

## 2018-08-11 ENCOUNTER — Ambulatory Visit (INDEPENDENT_AMBULATORY_CARE_PROVIDER_SITE_OTHER): Payer: Medicare HMO | Admitting: Physician Assistant

## 2018-08-11 ENCOUNTER — Encounter: Payer: Self-pay | Admitting: Physician Assistant

## 2018-08-11 VITALS — BP 140/82 | HR 71 | Temp 97.8°F | Ht 63.75 in | Wt 112.4 lb

## 2018-08-11 DIAGNOSIS — M549 Dorsalgia, unspecified: Secondary | ICD-10-CM

## 2018-08-11 NOTE — Progress Notes (Signed)
Megan Olson is a 77 y.o. female here for a new problem.  I acted as a Education administrator for [employer], PA-C Anselmo Pickler, LPN  History of Present Illness:   Chief Complaint  Patient presents with  . Back Pain    Back Pain  This is a new problem. Episode onset: Started 2 1/2 weeks ago, was helping husband take a tree down. The problem occurs constantly. The problem has been gradually worsening since onset. The pain is present in the thoracic spine. The quality of the pain is described as aching. The pain is at a severity of 8/10. The pain is severe. The pain is worse during the day. The symptoms are aggravated by bending, twisting, standing and position. Pertinent negatives include no fever, headaches, leg pain, numbness or tingling. Risk factors include menopause. She has tried heat for the symptoms. The treatment provided mild relief.   She has not taken anything for her symptoms, states that she doesn't even keep Tylenol at her house. At the start of this she spent two days using the loppers in her yard to help break down branches.    Past Medical History:  Diagnosis Date  . Glaucoma   . HYPERTENSION 03/11/2007  . OSTEOPENIA 03/11/2007     Social History   Socioeconomic History  . Marital status: Married    Spouse name: Not on file  . Number of children: Not on file  . Years of education: Not on file  . Highest education level: Not on file  Occupational History  . Not on file  Social Needs  . Financial resource strain: Not on file  . Food insecurity:    Worry: Not on file    Inability: Not on file  . Transportation needs:    Medical: Not on file    Non-medical: Not on file  Tobacco Use  . Smoking status: Former Smoker    Packs/day: 0.50    Years: 7.00    Pack years: 3.50    Types: Cigarettes    Last attempt to quit: 01/04/1961    Years since quitting: 57.6  . Smokeless tobacco: Never Used  Substance and Sexual Activity  . Alcohol use: Yes    Alcohol/week: 1.0 standard  drinks    Types: 1 Standard drinks or equivalent per week  . Drug use: No  . Sexual activity: Not on file  Lifestyle  . Physical activity:    Days per week: Not on file    Minutes per session: Not on file  . Stress: Not on file  Relationships  . Social connections:    Talks on phone: Not on file    Gets together: Not on file    Attends religious service: Not on file    Active member of club or organization: Not on file    Attends meetings of clubs or organizations: Not on file    Relationship status: Not on file  . Intimate partner violence:    Fear of current or ex partner: Not on file    Emotionally abused: Not on file    Physically abused: Not on file    Forced sexual activity: Not on file  Other Topics Concern  . Not on file  Social History Narrative   Married. Lives with husband (patient of Dr. Yong Channel). 1 son. No grandkids. 1 granddog.       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 BREAST EXCISIONAL BIOPSY Right 2000  . BREAST LUMPECTOMY  1990   benign  . DILATION AND CURETTAGE OF UTERUS     bleeding at menopause. No uterine cancer  . TONSILLECTOMY     age 78    Family History  Problem Relation Age of Onset  . Heart disease Mother        CHF mother died 3  . Arthritis Mother   . Glaucoma Mother        sister as well  . Alcohol abuse Father   . Cancer Sister        lung cancer, smoker  . Heart disease Sister        aortic valve replacement  . Hyperlipidemia Brother   . Hypertension Brother   . Arthritis Sister   . Hypertension Sister   . Colon cancer Neg Hx   . Colon polyps Neg Hx     Allergies  Allergen Reactions  . Ace Inhibitors   . Diamox [Acetazolamide]     Hypotensive event at ophthalmologist  . Penicillins     REACTION: rash  . Sulfamethoxazole     REACTION: rash     Current Medications:   Current Outpatient Medications:  .  ALPHAGAN P 0.1 % SOLN, Apply 1 drop topically daily. Apply 2 drops in right eye, Disp: , Rfl:  .  amLODipine (NORVASC) 5 MG tablet, Take 1 tablet (5 mg total) by mouth daily., Disp: 90 tablet, Rfl: 3 .  aspirin EC 81 MG tablet, Take 81 mg by mouth daily., Disp: , Rfl:  .  bimatoprost (LUMIGAN) 0.03 % ophthalmic solution, Place 1 drop into both eyes at bedtime., Disp: , Rfl:  .  Cholecalciferol (VITAMIN D3) 1000 UNITS CAPS, Take by mouth daily.  , Disp: , Rfl:  .  co-enzyme Q-10 50 MG capsule, Take 50 mg by mouth daily.  , Disp: , Rfl:  .  dorzolamide-timolol (COSOPT) 22.3-6.8 MG/ML ophthalmic solution, Place 1 drop into both eyes 2 (two) times daily., Disp: , Rfl: 11 .  Magnesium Oxide 500 MG TABS, Take 1 tablet by mouth daily.  , Disp: , Rfl:  .  Multiple Vitamins-Minerals (MULTIVITAMIN WITH MINERALS) tablet, Take 1 tablet by mouth daily.  , Disp: , Rfl:  .  omega-3 acid ethyl esters (LOVAZA) 1 g capsule, Take 1 g by mouth daily., Disp: , Rfl:  .  Resveratrol 50 MG CAPS, Take 1 capsule by mouth daily., Disp: , Rfl:    Review of Systems:   Review of Systems  Constitutional: Negative for fever.  Musculoskeletal: Positive for back pain.  Neurological: Negative for tingling, numbness and headaches.    Vitals:   Vitals:   08/11/18 1013  BP: 140/82  Pulse: 71  Temp: 97.8 F (36.6 C)  TempSrc: Oral  SpO2: 98%  Weight: 112 lb 6.1 oz (51 kg)  Height: 5' 3.75" (1.619 m)     Body mass index is 19.44 kg/m.  Physical Exam:   Physical Exam  Constitutional: She appears well-developed. She is cooperative.  Non-toxic appearance. She does not have a sickly appearance. She does not appear ill. No distress.  Cardiovascular: Normal rate, regular rhythm, S1 normal, S2 normal, normal heart sounds and normal pulses.  No LE edema  Pulmonary/Chest: Effort normal and breath sounds normal.  Musculoskeletal:  No decreased ROM 2/2  pain with flexion/extension, lateral side bends, or  rotation. Reproducible tenderness with deep palpation to bilateral paraspinal upper thoracic muscles. No bony tenderness. No evidence of erythema, rash or ecchymosis.  Neurological: She is alert. GCS eye subscore is 4. GCS verbal subscore is 5. GCS motor subscore is 6.  Skin: Skin is warm, dry and intact.  Psychiatric: She has a normal mood and affect. Her speech is normal and behavior is normal.  Nursing note and vitals reviewed.   Assessment and Plan:    Brilyn was seen today for back pain.  Diagnoses and all orders for this visit:  Upper back pain   No red flags on exam. Suspect muscle strain. Brief trial of ibuprofen, and if this is bothersome to her BP, recommended trialing ibuprofen instead. If symptoms persist, will recommend likely visit with PT for further eval and treat.  . Reviewed expectations re: course of current medical issues. . Discussed self-management of symptoms. . Outlined signs and symptoms indicating need for more acute intervention. . Patient verbalized understanding and all questions were answered. . See orders for this visit as documented in the electronic medical record. . Patient received an After-Visit Summary.  CMA or LPN served as scribe during this visit. History, Physical, and Plan performed by medical provider. The above documentation has been reviewed and is accurate and complete.  Inda Coke, PA-C

## 2018-08-11 NOTE — Patient Instructions (Signed)
It was great to see you!  For one week, let's have you trial ibuprofen 400 mg every 6 to 8 hours for a few days. If you notice this increases your blood pressure, you may stop and switch to acetaminophen 500 mg.  If symptoms persist, let me know and we can send you to physical therapy here at our office.  Take care,  Inda Coke PA-C

## 2018-08-18 ENCOUNTER — Ambulatory Visit: Payer: Medicare HMO | Admitting: Family Medicine

## 2018-08-30 DIAGNOSIS — R69 Illness, unspecified: Secondary | ICD-10-CM | POA: Diagnosis not present

## 2018-09-06 ENCOUNTER — Ambulatory Visit (INDEPENDENT_AMBULATORY_CARE_PROVIDER_SITE_OTHER): Payer: Medicare HMO

## 2018-09-06 VITALS — BP 118/84 | HR 64 | Ht 63.75 in | Wt 111.0 lb

## 2018-09-06 DIAGNOSIS — Z0000 Encounter for general adult medical examination without abnormal findings: Secondary | ICD-10-CM

## 2018-09-06 NOTE — Patient Instructions (Addendum)
Megan Olson , Thank you for taking time to come for your Medicare Wellness Visit. I appreciate your ongoing commitment to your health goals. Please review the following plan we discussed and let me know if I can assist you in the future.   These are the goals we discussed: Goals    . Exercise 150 min/wk Moderate Activity     Increase your exercise from 2 days a week to 3 days a week. Looking at joining [employer]       This is a list of the screening recommended for you and due dates:  Health Maintenance  Topic Date Due  . Tetanus Vaccine  02/11/2020  . Colon Cancer Screening  07/15/2020  . Flu Shot  Completed  . DEXA scan (bone density measurement)  Completed  . Pneumonia vaccines  Completed   Preventive Care for Adults  A healthy lifestyle and preventive care can promote health and wellness. Preventive health guidelines for adults include the following key practices.  . A routine yearly physical is a good way to check with your health care provider about your health and preventive screening. It is a chance to share any concerns and updates on your health and to receive a thorough exam.  . Visit your dentist for a routine exam and preventive care every 6 months. Brush your teeth twice a day and floss once a day. Good oral hygiene prevents tooth decay and gum disease.  . The frequency of eye exams is based on your age, health, family medical history, use  of contact lenses, and other factors. Follow your health care provider's recommendations for frequency of eye exams.  . Eat a healthy diet. Foods like vegetables, fruits, whole grains, low-fat dairy products, and lean protein foods contain the nutrients you need without too many calories. Decrease your intake of foods high in solid fats, added sugars, and salt. Eat the right amount of calories for you. Get information about a proper diet from your health care provider, if necessary.  . Regular physical exercise is one of the most  important things you can do for your health. Most adults should get at least 150 minutes of moderate-intensity exercise (any activity that increases your heart rate and causes you to sweat) each week. In addition, most adults need muscle-strengthening exercises on 2 or more days a week.  Silver Sneakers may be a benefit available to you. To determine eligibility, you may visit the website: www.silversneakers.com or contact program at [phone] Mon-Fri between 8AM-8PM.   . Maintain a healthy weight. The body mass index (BMI) is a screening tool to identify possible weight problems. It provides an estimate of body fat based on height and weight. Your health care provider can find your BMI and can help you achieve or maintain a healthy weight.   For adults 20 years and older: ? A BMI below 18.5 is considered underweight. ? A BMI of 18.5 to 24.9 is normal. ? A BMI of 25 to 29.9 is considered overweight. ? A BMI of 30 and above is considered obese.   . Maintain normal blood lipids and cholesterol levels by exercising and minimizing your intake of saturated fat. Eat a balanced diet with plenty of fruit and vegetables. Blood tests for lipids and cholesterol should begin at age 47 and be repeated every 5 years. If your lipid or cholesterol levels are high, you are over 50, or you are at high risk for heart disease, you may need your cholesterol levels checked  more frequently. Ongoing high lipid and cholesterol levels should be treated with medicines if diet and exercise are not working.  . If you smoke, find out from your health care provider how to quit. If you do not use tobacco, please do not start.  . If you choose to drink alcohol, please do not consume more than 2 drinks per day. One drink is considered to be 12 ounces (355 mL) of beer, 5 ounces (148 mL) of wine, or 1.5 ounces (44 mL) of liquor.  . If you are 12-72 years old, ask your health care provider if you should take aspirin to prevent  strokes.  . Use sunscreen. Apply sunscreen liberally and repeatedly throughout the day. You should seek shade when your shadow is shorter than you. Protect yourself by wearing long sleeves, pants, a wide-brimmed hat, and sunglasses year round, whenever you are outdoors.  . Once a month, do a whole body skin exam, using a mirror to look at the skin on your back. Tell your health care provider of new moles, moles that have irregular borders, moles that are larger than a pencil eraser, or moles that have changed in shape or color.

## 2018-09-06 NOTE — Progress Notes (Signed)
Subjective:   Megan Olson is a 77 y.o. female who presents for an Initial Medicare Annual Wellness Visit.  Review of Systems    No ROS.  Medicare Wellness Visit. Additional risk factors are reflected in the social history.  Patient lives in a single story home with her husband of 71 years. They do not have any pets. They have a son, Megan Olson, who lives here in [place]. Patient enjoys reading, she ushers with her husband at the [employer], they work at [employer], they used to take [employer]. She is a member of [employer]. She is a Magazine features editor.  Patient goes to bed around 10-10:30. She gets up about once a night to go to the bathroom but this is not every night. She gets up around 6-6:15 in the morning. She feels rested most days.    Objective:    Today's Vitals   09/06/18 0948  BP: 118/84  Pulse: 64  SpO2: 97%  Weight: 111 lb (50.3 kg)  Height: 5' 3.75" (1.619 m)   Body mass index is 19.2 kg/m.  Advanced Directives 09/06/2018 05/22/2015  Does Patient Have a Medical Advance Directive? Yes Yes  Type of Paramedic of [place];Living will Portal;Living will  Does patient want to make changes to medical advance directive? No - Patient declined No - Patient declined  Copy of [place] in Chart? No - copy requested No - copy requested    Current Medications (verified) Outpatient Encounter Medications as of 09/06/2018  Medication Sig  . ALPHAGAN P 0.1 % SOLN Apply 1 drop topically daily. Apply 2 drops in right eye  . amLODipine (NORVASC) 5 MG tablet Take 1 tablet (5 mg total) by mouth daily.  Marland Kitchen aspirin EC 81 MG tablet Take 81 mg by mouth daily.  . bimatoprost (LUMIGAN) 0.03 % ophthalmic solution Place 1 drop into both eyes at bedtime.  . Cholecalciferol (VITAMIN D3) 1000 UNITS CAPS Take by mouth daily.    Marland Kitchen co-enzyme Q-10 50 MG capsule Take 50 mg by mouth daily.    .  dorzolamide-timolol (COSOPT) 22.3-6.8 MG/ML ophthalmic solution Place 1 drop into both eyes 2 (two) times daily.  . Magnesium Oxide 500 MG TABS Take 1 tablet by mouth daily.    . Multiple Vitamins-Minerals (MULTIVITAMIN WITH MINERALS) tablet Take 1 tablet by mouth daily.    Marland Kitchen omega-3 acid ethyl esters (LOVAZA) 1 g capsule Take 1 g by mouth daily.  Marland Kitchen Resveratrol 50 MG CAPS Take 1 capsule by mouth daily.   No facility-administered encounter medications on file as of 09/06/2018.     Allergies (verified) Ace inhibitors; Diamox [acetazolamide]; Penicillins; and Sulfamethoxazole   History: Past Medical History:  Diagnosis Date  . Glaucoma   . HYPERTENSION 03/11/2007  . OSTEOPENIA 03/11/2007   Past Surgical History:  Procedure Laterality Date  . BREAST EXCISIONAL BIOPSY Right 2000  . BREAST LUMPECTOMY  1990   benign  . DILATION AND CURETTAGE OF UTERUS     bleeding at menopause. No uterine cancer  . TONSILLECTOMY     age 39   Family History  Problem Relation Age of Onset  . Heart disease Mother        CHF mother died 59  . Arthritis Mother   . Glaucoma Mother        sister as well  . Alcohol abuse Father   . Suicidality Father   . Cancer Sister  lung cancer, smoker  . Heart disease Sister        aortic valve replacement  . Hyperlipidemia Brother   . Hypertension Brother   . COPD Brother   . Arthritis Sister   . Hypertension Sister   . Glaucoma Sister   . Hashimoto's thyroiditis Sister   . Hypertension Son   . Stroke Maternal Grandmother   . Colon cancer Neg Hx   . Colon polyps Neg Hx    Social History   Socioeconomic History  . Marital status: Married    Spouse name: Not on file  . Number of children: Not on file  . Years of education: Not on file  . Highest education level: Not on file  Occupational History  . Not on file  Social Needs  . Financial resource strain: Not on file  . Food insecurity:    Worry: Not on file    Inability: Not on file  .  Transportation needs:    Medical: Not on file    Non-medical: Not on file  Tobacco Use  . Smoking status: Former Smoker    Packs/day: 0.50    Years: 7.00    Pack years: 3.50    Types: Cigarettes    Last attempt to quit: 01/04/1961    Years since quitting: 57.7  . Smokeless tobacco: Never Used  Substance and Sexual Activity  . Alcohol use: Yes    Alcohol/week: 1.0 standard drinks    Types: 1 Standard drinks or equivalent per week  . Drug use: No  . Sexual activity: Not Currently  Lifestyle  . Physical activity:    Days per week: Not on file    Minutes per session: Not on file  . Stress: Not on file  Relationships  . Social connections:    Talks on phone: Not on file    Gets together: Not on file    Attends religious service: Not on file    Active member of club or organization: Not on file    Attends meetings of clubs or organizations: Not on file    Relationship status: Not on file  Other Topics Concern  . Not on file  Social History Narrative   Married. Lives with husband (patient of Dr. Yong Channel). 1 son. No grandkids. 1 granddog.       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 N  Vision? N  Difficulty concentrating or making decisions? N  Walking or climbing stairs? N  Dressing or bathing? N  Doing errands, shopping? N  Preparing Food and eating ? N  Using the Toilet? N  In the past six months, have you accidently leaked urine? N  Do you have problems with loss of bowel control? N  Managing your Medications? N  Managing your Finances? N  Housekeeping or managing your Housekeeping? N  Some recent data might be hidden     Immunizations and Health  Maintenance Immunization History  Administered Date(s) Administered  . Influenza Split 07/19/2012  . Influenza Whole 07/17/2008, 06/28/2009, 06/25/2010  . Influenza, High Dose Seasonal PF 07/22/2016, 06/24/2017, 06/23/2018  . Influenza,inj,Quad PF,6+ Mos 06/27/2013, 06/18/2014  . Influenza-Unspecified 07/03/2015  . Pneumococcal Conjugate-13  03/29/2015  . Pneumococcal Polysaccharide-23 04/04/2006  . Td 02/10/2010  . Zoster 09/26/2008   There are no preventive care reminders to display for this patient.  Patient Care Team: [name], MD as PCP - General (Family Medicine)  Indicate any recent Medical Services you may have received from other than [facility] providers in the past year (date may be approximate).     Assessment:   This is a routine wellness examination for Lettie.  Hearing/Vision screen  Hearing Screening   Method: Audiometry   125Hz  250Hz  500Hz  1000Hz  2000Hz  3000Hz  4000Hz  6000Hz  8000Hz   Right ear:   Pass Pass Pass  Pass    Left ear:   Pass Pass Pass  Pass      Visual Acuity Screening   Right eye Left eye Both eyes  Without correction:     With correction: 20/20 20/20 20/20   Comments: Wears glasses   Dietary issues and exercise activities discussed: Current Exercise Habits: Structured exercise class(Member at [facility] Type of exercise: strength training/weights, Time (Minutes): 60, Frequency (Times/Week): 2, Weekly Exercise (Minutes/Week): 120, Intensity: Mild, Exercise limited by: None identified   Breakfast: Oatmeal with prunes, protein powder, almond milk, strawberries or blueberries, black coffee  Lunch: Green smoothie, fresh vegetables, with protein powder, a meat, normally water to drink  Dinner: fish, couple of vegetables, chicken, shrimp, salmon, normally drinks water  Snacks: limited chips, fruit, walnuts, peanut butter, greek yogurt Goals    . Exercise 150 min/wk Moderate Activity     Increase your exercise from 2 days a week to 3 days a  week. Looking at joining [place] 2/9 Scores 09/06/2018 04/04/2018 04/02/2017 04/01/2016 03/29/2015 03/26/2014  PHQ - 2 Score 0 0 0 0 0 0  PHQ- 9 Score 0 - - - - -    Fall Risk Fall Risk  09/06/2018 04/04/2018 04/02/2017 04/01/2016 03/29/2015  Falls in the past year? 0 No No No No       Cognitive Function: MMSE - Mini Mental State Exam 09/06/2018  Orientation to time 5  Orientation to Place 5  Registration 3  Attention/ Calculation 5  Recall 3  Language- name 2 objects 2  Language- repeat 1  Language- follow 3 step command 3  Language- read & follow direction 1  Write a sentence 1  Copy design 1  Total score 30        Screening Tests Health Maintenance  Topic Date Due  . TETANUS/TDAP  02/11/2020  . COLONOSCOPY  07/15/2020  . INFLUENZA VACCINE  Completed  . DEXA SCAN  Completed  . PNA vac Low Risk Adult  Completed       Plan:    Follow Up With PCP as Advised  I have personally reviewed and noted the following in the patient's chart:   . Medical and social history . Use of alcohol, tobacco or illicit drugs  . Current medications and supplements . Functional ability and status . Nutritional status . Physical activity . Advanced directives . List of other physicians . Vitals . Screenings to include cognitive, depression, and falls . Referrals and appointments  In addition, I have reviewed and discussed with patient certain preventive protocols, quality metrics, and best practice recommendations. A written personalized care plan for preventive services as well as general preventive health recommendations were provided to patient.     [place], [place]   [id number]/10/[id number]

## 2018-09-06 NOTE — Progress Notes (Signed)
PCP notes: Last OV with PCP 04/04/2018   Health maintenance: Up to Date   Abnormal screenings: None   Patient concerns: Constipation. 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 She wants to know if she should be taking a daily stool softener or something such as Metamucil to help her regulate? I offered to schedule her for a follow up appointment but she declined as she is scheduled in January and will address it then.   Nurse concerns:None   Next PCP appt: 10/10/2018

## 2018-09-06 NOTE — Progress Notes (Signed)
I have reviewed and agree with note, evaluation, plan.  Would be very reasonable for her to take Metamucil daily and see how she does with this.  I look forward to seeing her in January  Garret Reddish, MD

## 2018-09-08 ENCOUNTER — Telehealth: Payer: Self-pay

## 2018-09-08 NOTE — Telephone Encounter (Signed)
Called and left a voicemail message letting patient know that she can take Metamucil daily. I provided a call back number for questions.

## 2018-09-08 NOTE — Telephone Encounter (Signed)
-----   Message from [name], MD sent at 09/06/2018 12:37 PM EST -----   ----- Message ----- From: Mariam Dollar, Lonia Mad, LPN Sent: [id number]/05/6380  10:05 AM EST To: [name], MD

## 2018-09-09 NOTE — Telephone Encounter (Signed)
Patient returning call to [facility] stating that she did not receive VM. Advised patient of message left, patient states she has no questions at this time.

## 2018-10-10 ENCOUNTER — Encounter: Payer: Self-pay | Admitting: Family Medicine

## 2018-10-10 ENCOUNTER — Ambulatory Visit (INDEPENDENT_AMBULATORY_CARE_PROVIDER_SITE_OTHER): Payer: Medicare HMO | Admitting: Family Medicine

## 2018-10-10 VITALS — BP 128/80 | HR 75 | Temp 97.9°F | Ht 63.75 in | Wt 111.4 lb

## 2018-10-10 DIAGNOSIS — R739 Hyperglycemia, unspecified: Secondary | ICD-10-CM | POA: Diagnosis not present

## 2018-10-10 DIAGNOSIS — M549 Dorsalgia, unspecified: Secondary | ICD-10-CM | POA: Diagnosis not present

## 2018-10-10 DIAGNOSIS — I10 Essential (primary) hypertension: Secondary | ICD-10-CM | POA: Diagnosis not present

## 2018-10-10 DIAGNOSIS — M545 Low back pain, unspecified: Secondary | ICD-10-CM

## 2018-10-10 LAB — POCT GLYCOSYLATED HEMOGLOBIN (HGB A1C): HEMOGLOBIN A1C: 5.6 % (ref 4.0–5.6)

## 2018-10-10 NOTE — Progress Notes (Signed)
Subjective:  Megan Olson is a 78 y.o. year old very pleasant female patient who presents for/with See problem oriented charting ROS-upper back pain particularly medial to her shoulder blades.  No midline pain.  Complains of back pain.  No paresthesias reported.  No chest pain or shortness of breath reported.  Past Medical History-  Patient Active Problem List   Diagnosis Date Noted  . Hyperglycemia 03/29/2015    Priority: Medium  . Hyperlipidemia 10/18/2014    Priority: Medium  . Essential hypertension 03/11/2007    Priority: Medium  . History of adenomatous polyp of colon 07/23/2015    Priority: Low  . Raynaud phenomenon 10/19/2014    Priority: Low  . Syncope 10/19/2014    Priority: Low  . Glaucoma 01/05/2011    Priority: Low  . Osteopenia 03/11/2007    Priority: Low    Medications- reviewed and updated Current Outpatient Medications  Medication Sig Dispense Refill  . ALPHAGAN P 0.1 % SOLN Apply 1 drop topically daily. Apply 2 drops in right eye    . amLODipine (NORVASC) 5 MG tablet Take 1 tablet (5 mg total) by mouth daily. 90 tablet 3  . bimatoprost (LUMIGAN) 0.03 % ophthalmic solution Place 1 drop into both eyes at bedtime.    . Cholecalciferol (VITAMIN D3) 1000 UNITS CAPS Take by mouth daily.      Marland Kitchen co-enzyme Q-10 50 MG capsule Take 50 mg by mouth daily.      . dorzolamide-timolol (COSOPT) 22.3-6.8 MG/ML ophthalmic solution Place 1 drop into both eyes 2 (two) times daily.  11  . Magnesium Oxide 500 MG TABS Take 1 tablet by mouth daily.      . Multiple Vitamins-Minerals (MULTIVITAMIN WITH MINERALS) tablet Take 1 tablet by mouth daily.      Marland Kitchen omega-3 acid ethyl esters (LOVAZA) 1 g capsule Take 1 g by mouth daily.    Marland Kitchen Resveratrol 50 MG CAPS Take 1 capsule by mouth daily.     No current facility-administered medications for this visit.     Objective: BP 128/80 (BP Location: Left Arm, Patient Position: Sitting, Cuff Size: Large)   Pulse 75   Temp 97.9 F (36.6 C)  (Oral)   Ht 5' 3.75" (1.619 m)   Wt 111 lb 6.4 oz (50.5 kg)   SpO2 97%   BMI 19.27 kg/m  Gen: NAD, resting comfortably CV: RRR no murmurs rubs or gallops Lungs: CTAB no crackles, wheeze, rhonchi Abdomen: soft/nontender/nondistended/normal bowel sounds. No rebound or guarding.  Ext: no edema Skin: warm, dry MSK: Patient with no midline pain.  She does have some pain medial to right shoulder blade- muscle spasm/knot of muscle noted-felt better with massage.  Patient also with some lumbar spine paraspinous muscle tenderness.  Assessment/Plan:  Other notes: 1.plans to stop aspirin as was using for primary prevention given no history of heart attack or stroke 2. Plans to get shingrix at her pharmacy  Hypertension S: controlled on amlodipine 5 mg BP Readings from Last 3 Encounters:  10/10/18 128/80  09/06/18 118/84  08/11/18 140/82  A/P: We discussed blood pressure goal of <140/90. Continue current meds   Upper back pain - Plan: Ambulatory referral to Physical Therapy Acute bilateral low back pain without sciatica - Plan: Ambulatory referral to Physical Therapy S: patient seen about 2 months ago with upper back pain (under shoulder blades- may have a day or two off then comes back)- upper back pain somewhat improved but now persistent total of almost 3 months but  now having some low back pain over last week or two. No midline pain.  She is interested in PT. No red flags. Feels tension/knots of muscle medial to right shoulder blade.  A/P: for upper and low back pain- interested in PT-will refer today.  We also discussed seeing Dr. Paulla Fore if not making good progress.  She does have osteoporosis-could consider films of spine if not improving  Hyperglycemia S: A1c was trending up as of last visit up to 6.3.  Since that time,she states has tried to be diligent about food choices.   Lab Results  Component Value Date   HGBA1C 5.6 10/10/2018   HGBA1C 6.3 04/04/2018   HGBA1C 5.9 09/17/2017    A/P: much improved- will continue to monitor- she is going to remain diligent but states may loosen up some  Future Appointments  Date Time Provider [place]  10/18/2018  8:45 AM Lyndee Hensen, PT [facility]  04/11/2019  8:40 AM [name], MD [facility]  10/09/2020  1:00 PM [facility] HEALTH COACH [facility]   Lab/Order associations: Hyperglycemia - Plan: POCT glycosylated hemoglobin (Hb A1C)  Essential hypertension  Upper back pain - Plan: Ambulatory referral to Physical Therapy  Acute bilateral low back pain without sciatica - Plan: Ambulatory referral to Physical Therapy  Return precautions advised.  Garret Reddish, MD

## 2018-10-10 NOTE — Patient Instructions (Addendum)
Schedule a visit with physical therapy at the check out desk before you leave  I agree with you about stopping aspirin  [place] job on your diligence with food choices- a1c looks great today at 5.6.   Look forward to seeing you in July- sooner if needed- I can also place a referral to Dr. Paulla Fore of sports medicine if not making good progress with your back with PT

## 2018-10-10 NOTE — Assessment & Plan Note (Signed)
S: A1c was trending up as of last visit up to 6.3.  Since that time,she states has tried to be diligent about food choices.   Lab Results  Component Value Date   HGBA1C 5.6 10/10/2018   HGBA1C 6.3 04/04/2018   HGBA1C 5.9 09/17/2017   A/P: much improved- will continue to monitor- she is going to remain diligent but states may loosen up some

## 2018-10-17 DIAGNOSIS — H401132 Primary open-angle glaucoma, bilateral, moderate stage: Secondary | ICD-10-CM | POA: Diagnosis not present

## 2018-10-18 ENCOUNTER — Ambulatory Visit: Payer: Medicare HMO | Admitting: Physical Therapy

## 2018-10-18 ENCOUNTER — Encounter: Payer: Self-pay | Admitting: Physical Therapy

## 2018-10-18 DIAGNOSIS — M546 Pain in thoracic spine: Secondary | ICD-10-CM

## 2018-10-18 NOTE — Therapy (Signed)
[facility] [address] [place], [place], [address] Phone: [phone]   Fax:  [phone]  Physical Therapy Evaluation  Patient Details  Name: Megan Olson MRN: [id number] Date of Birth: 03/20/1941 Referring Provider (PT): Garret Reddish   Encounter Date: 10/18/2018  PT End of Session - 10/18/18 1306    Visit Number  1    Number of Visits  12    Date for PT Re-Evaluation  11/29/18    Authorization Type  [employer] Medicare    PT Start Time  [phone]    PT Stop Time  0930    PT Time Calculation (min)  40 min    Activity Tolerance  Patient tolerated treatment well    Behavior During Therapy  [facility] for tasks assessed/performed       Past Medical History:  Diagnosis Date  . Glaucoma   . HYPERTENSION 03/11/2007  . OSTEOPENIA 03/11/2007    Past Surgical History:  Procedure Laterality Date  . BREAST EXCISIONAL BIOPSY Right 2000  . BREAST LUMPECTOMY  1990   benign  . DILATION AND CURETTAGE OF UTERUS     bleeding at menopause. No uterine cancer  . TONSILLECTOMY     age 73    There were no vitals filed for this visit.   Subjective Assessment - 10/18/18 0854    Subjective  Pt states increased pain in mid October, was cutting branches for several days. Pt states she is very active at home, but has not had previous back pain. She has thoracic pain and low back pain that has continued. Also has mild pain near shoulder blades. Usually pt was going to gym 2x/wk, elliptical and weights. She also walks daily.     Limitations  Sitting;Lifting;Walking;House hold activities;Standing    Currently in Pain?  Yes    Pain Score  7     Pain Location  Back    Pain Orientation  Right;Left    Pain Descriptors / Indicators  Aching;Sharp    Pain Type  Acute pain    Pain Onset  More than a month ago    Pain Frequency  Intermittent    Aggravating Factors   Unable to state, states pain "all the time"     Pain Relieving Factors  stretching, heating pad, has  been unable to relieve pain.          [facility] PT Assessment - 10/18/18 0001      Assessment   Medical Diagnosis  Back /Thoracic Pain    Referring Provider (PT)  Garret Reddish    Prior Therapy  No      Precautions   Precautions  None      Balance Screen   Has the patient fallen in the past 6 months  No      Prior Function   Level of Independence  Independent      Cognition   Overall Cognitive Status  Within Functional Limits for tasks assessed      ROM / Strength   AROM / PROM / Strength  AROM;Strength      AROM   Overall AROM Comments  Lumbar:Flexion: mod deficit/pain; extension: mod deficit/pain;  SB: mild deficit/ pain;  Hip: [facility]      Strength   Overall Strength Comments  UE: WFL;  Hips: 4/5; Knees: 4+/5;       Palpation   Palpation comment  Soreness in thoracic and lumbar paraspinals, bil QL, minimal pain in central lumbar/spinous processes;  Special Tests   Other special tests  States pain with sneeze, and with deep breath;       Transfers   Transfers  Independent with all Transfers   Significant pain with all transfers.                Objective measurements completed on examination: See above findings.      [facility] Adult PT Treatment/Exercise - 10/18/18 0001      Exercises   Exercises  Lumbar      Lumbar Exercises: Stretches   Active Hamstring Stretch  3 reps;30 seconds    Active Hamstring Stretch Limitations  seated    Single Knee to Chest Stretch  3 reps;30 seconds    Lower Trunk Rotation  5 reps;10 seconds    Other Lumbar Stretch Exercise  Standing QL stretch at wall 30 sec x2 bil;              PT Education - 10/18/18 1306    Education Details  PT POC, HEP    Person(s) Educated  Patient    Methods  Explanation;Handout;Verbal cues;Demonstration    Comprehension  Verbalized understanding;Need further instruction       PT Short Term Goals - 10/18/18 1315      PT SHORT TERM GOAL #1   Title  Pt to be independent with  initial HEP    Time  2    Period  Weeks    Status  New    Target Date  11/01/18      PT SHORT TERM GOAL #2   Title  Pt to report decreased pain to 0-4/10 with movement.     Time  2    Period  Weeks    Status  New    Target Date  11/01/18        PT Long Term Goals - 10/18/18 1316      PT LONG TERM GOAL #1   Title  Pt to be independent with final HEP    Time  6    Period  Weeks    Status  New    Target Date  11/29/18      PT LONG TERM GOAL #2   Title  Pt to report decreased pain to 0-3/10 with activity and transfers    Time  6    Period  Weeks    Status  New    Target Date  11/29/18      PT LONG TERM GOAL #3   Title  Pt to demo improved lumbar and thoracic ROM to be WNL, to improve ability for IADLS and ADLS.     Time  6    Period  Weeks    Status  New    Target Date  11/29/18             Plan - 10/18/18 1319    Clinical Impression Statement  Pt presents with primary complaint of increased pain in thoracic region. She has pain to palpate thoracic paraspinals and bil QL region. States no pain in central thoracic or lumbar spine, no pain to palpate spinous process. Pt with significant pain with all transfers, and has had lack of mobility, due to increased pain. Pt with decreased ROM for thoracic and lumbar spine, due to pain. He has mild weakness in core and hips. Pt with decreased ability for full functional activites, due to pain. Pt to benefit from skilled PT to improve pain. Some concern for level of pain pt  is having after being 3 mo post injury. Pt also with mild skin redness/rash on thoracic/lumbar back and on her stomach. Could be residual from her using heat pack prior to appt, but would likely not be on her stomach. Discussed her getting appt with MD if this does not improve today or tomorrow, pt states understanding.     Clinical Presentation  Evolving    Clinical Decision Making  Moderate    Rehab Potential  Good    PT Frequency  2x / week    PT Duration   6 weeks    PT Treatment/Interventions  ADLs/Self Care Home Management;Cryotherapy;Electrical Stimulation;Iontophoresis 4mg /ml Dexamethasone;Functional mobility training;Stair training;Gait training;DME Instruction;Ultrasound;Traction;Moist Heat;Therapeutic activities;Therapeutic exercise;Balance training;Neuromuscular re-education;Patient/family education;Passive range of motion;Manual techniques;Dry needling    Consulted and Agree with Plan of Care  Patient       Patient will benefit from skilled therapeutic intervention in order to improve the following deficits and impairments:  Abnormal gait, Pain, Improper body mechanics, Decreased mobility, Increased muscle spasms, Postural dysfunction, Decreased range of motion, Decreased strength, Decreased endurance, Decreased activity tolerance, Difficulty walking, Impaired flexibility  Visit Diagnosis: Pain in thoracic spine     Problem List Patient Active Problem List   Diagnosis Date Noted  . History of adenomatous polyp of colon 07/23/2015  . Hyperglycemia 03/29/2015  . Raynaud phenomenon 10/19/2014  . Syncope 10/19/2014  . Hyperlipidemia 10/18/2014  . Glaucoma 01/05/2011  . Essential hypertension 03/11/2007  . Osteopenia 03/11/2007   Lyndee Hensen, PT, DPT 1:42 PM  10/18/18    [facility] [place], [place], [address] Phone: [phone]   Fax:  [phone]  Name: FERNANDE TREIBER MRN: [id number] Date of Birth: 1941/06/10

## 2018-10-18 NOTE — Patient Instructions (Signed)
Access Code: [email or web address]  URL: [email or web address]  Date: 10/18/2018  Prepared by: Lyndee Hensen   Exercises  Single Knee to Chest Stretch - 3 reps - 30 hold - 2x daily  Supine Lower Trunk Rotation - 10 reps - 5 hold - 2x daily  Standing Sidebending with Chair Support - 3 reps - 30 hold - 2x daily  Seated Hamstring Stretch - 3 reps - 30 hold - 2x daily

## 2018-10-20 ENCOUNTER — Encounter: Payer: Self-pay | Admitting: Physical Therapy

## 2018-10-20 ENCOUNTER — Ambulatory Visit: Payer: Medicare HMO | Admitting: Physical Therapy

## 2018-10-20 DIAGNOSIS — M546 Pain in thoracic spine: Secondary | ICD-10-CM | POA: Diagnosis not present

## 2018-10-21 NOTE — Therapy (Signed)
[facility] [address] [place], [place], [address] Phone: [phone]   Fax:  [phone]  Physical Therapy Treatment  Patient Details  Name: Megan Olson MRN: [id number] Date of Birth: 04/16/1941 Referring Provider (PT): Garret Reddish   Encounter Date: 10/20/2018  PT End of Session - 10/20/18 1312    Visit Number  2    Number of Visits  12    Date for PT Re-Evaluation  11/29/18    Authorization Type  Aetna Medicare    PT Start Time  8099    PT Stop Time  1348    PT Time Calculation (min)  42 min    Activity Tolerance  Patient tolerated treatment well    Behavior During Therapy  [facility] for tasks assessed/performed       Past Medical History:  Diagnosis Date  . Glaucoma   . HYPERTENSION 03/11/2007  . OSTEOPENIA 03/11/2007    Past Surgical History:  Procedure Laterality Date  . BREAST EXCISIONAL BIOPSY Right 2000  . BREAST LUMPECTOMY  1990   benign  . DILATION AND CURETTAGE OF UTERUS     bleeding at menopause. No uterine cancer  . TONSILLECTOMY     age 78    There were no vitals filed for this visit.  Subjective Assessment - 10/20/18 1312    Subjective  Pt states mild improvement of pain. She has been doing HEP. Rash on back and stomach is resolved.     Currently in Pain?  Yes    Pain Score  6     Pain Location  Back    Pain Orientation  Right;Left    Pain Descriptors / Indicators  Aching    Pain Type  Acute pain    Pain Onset  More than a month ago    Pain Frequency  Intermittent                       [facility] Adult PT Treatment/Exercise - 10/20/18 1313      Transfers   Transfers  --      Exercises   Exercises  Lumbar      Lumbar Exercises: Stretches   Active Hamstring Stretch  3 reps;30 seconds    Active Hamstring Stretch Limitations  seated    Single Knee to Chest Stretch  3 reps;30 seconds    Lower Trunk Rotation  5 reps;10 seconds    Pelvic Tilt  20 reps    Other Lumbar Stretch Exercise   Standing QL stretch at wall 30 sec x2 bil;       Lumbar Exercises: Seated   Other Seated Lumbar Exercises  Education for proper seated posture, neutral pelvis.       Lumbar Exercises: Supine   Other Supine Lumbar Exercises  Shoulder Flexion AAROM/ cane for lat stretch 5 sec x10;       Manual Therapy   Manual Therapy  Soft tissue mobilization    Soft tissue mobilization  Bil Lumbar and thoracic paraspinals, L rhomboid;                PT Short Term Goals - 10/18/18 1315      PT SHORT TERM GOAL #1   Title  Pt to be independent with initial HEP    Time  2    Period  Weeks    Status  New    Target Date  11/01/18      PT SHORT TERM GOAL #2  Title  Pt to report decreased pain to 0-4/10 with movement.     Time  2    Period  Weeks    Status  New    Target Date  11/01/18        PT Long Term Goals - 10/18/18 1316      PT LONG TERM GOAL #1   Title  Pt to be independent with final HEP    Time  6    Period  Weeks    Status  New    Target Date  11/29/18      PT LONG TERM GOAL #2   Title  Pt to report decreased pain to 0-3/10 with activity and transfers    Time  6    Period  Weeks    Status  New    Target Date  11/29/18      PT LONG TERM GOAL #3   Title  Pt to demo improved lumbar and thoracic ROM to be WNL, to improve ability for IADLS and ADLS.     Time  6    Period  Weeks    Status  New    Target Date  11/29/18            Plan - 10/21/18 1313    Clinical Impression Statement  Pt with mild soreness with STM today in thoracic musculature. She has most pain with transfers. She is improving with ability for ther ex, little pain with this today. Plan to progress as tolerated.     Rehab Potential  Good    PT Frequency  2x / week    PT Duration  6 weeks    PT Treatment/Interventions  ADLs/Self Care Home Management;Cryotherapy;Electrical Stimulation;Iontophoresis 4mg /ml Dexamethasone;Functional mobility training;Stair training;Gait training;DME  Instruction;Ultrasound;Traction;Moist Heat;Therapeutic activities;Therapeutic exercise;Balance training;Neuromuscular re-education;Patient/family education;Passive range of motion;Manual techniques;Dry needling    Consulted and Agree with Plan of Care  Patient       Patient will benefit from skilled therapeutic intervention in order to improve the following deficits and impairments:  Abnormal gait, Pain, Improper body mechanics, Decreased mobility, Increased muscle spasms, Postural dysfunction, Decreased range of motion, Decreased strength, Decreased endurance, Decreased activity tolerance, Difficulty walking, Impaired flexibility  Visit Diagnosis: Pain in thoracic spine     Problem List Patient Active Problem List   Diagnosis Date Noted  . History of adenomatous polyp of colon 07/23/2015  . Hyperglycemia 03/29/2015  . Raynaud phenomenon 10/19/2014  . Syncope 10/19/2014  . Hyperlipidemia 10/18/2014  . Glaucoma 01/05/2011  . Essential hypertension 03/11/2007  . Osteopenia 03/11/2007    Lyndee Hensen, PT, DPT 1:15 PM  10/21/18    [facility] [place], [place], [address] Phone: [phone]   Fax:  [phone]  Name: KYLIEGH JESTER MRN: [id number] Date of Birth: [birth year]

## 2018-10-25 ENCOUNTER — Ambulatory Visit: Payer: Medicare HMO | Admitting: Physical Therapy

## 2018-10-25 ENCOUNTER — Encounter: Payer: Self-pay | Admitting: Physical Therapy

## 2018-10-25 DIAGNOSIS — M546 Pain in thoracic spine: Secondary | ICD-10-CM | POA: Diagnosis not present

## 2018-10-25 NOTE — Therapy (Signed)
[facility] [address] [place], [place], [address] Phone: [phone]   Fax:  [phone]  Physical Therapy Treatment  Patient Details  Name: Megan Olson MRN: [id number] Date of Birth: 08-25-1941 Referring Provider (PT): Garret Reddish   Encounter Date: 10/25/2018  PT End of Session - 10/25/18 1333    Visit Number  3    Number of Visits  12    Date for PT Re-Evaluation  11/29/18    Authorization Type  Aetna Medicare    PT Start Time  1306    PT Stop Time  1400    PT Time Calculation (min)  54 min    Activity Tolerance  Patient tolerated treatment well    Behavior During Therapy  [facility] for tasks assessed/performed       Past Medical History:  Diagnosis Date  . Glaucoma   . HYPERTENSION 03/11/2007  . OSTEOPENIA 03/11/2007    Past Surgical History:  Procedure Laterality Date  . BREAST EXCISIONAL BIOPSY Right 2000  . BREAST LUMPECTOMY  1990   benign  . DILATION AND CURETTAGE OF UTERUS     bleeding at menopause. No uterine cancer  . TONSILLECTOMY     age 65    There were no vitals filed for this visit.  Subjective Assessment - 10/25/18 1332    Subjective  Pt states pain slightly improved. " i know its better than it has been"     Currently in Pain?  Yes    Pain Score  6     Pain Location  Back    Pain Orientation  Right;Left    Pain Descriptors / Indicators  Aching    Pain Type  Acute pain    Pain Onset  More than a month ago    Pain Frequency  Intermittent                       [facility] Adult PT Treatment/Exercise - 10/25/18 1302      Exercises   Exercises  Lumbar      Lumbar Exercises: Stretches   Active Hamstring Stretch  3 reps;30 seconds    Active Hamstring Stretch Limitations  seated    Single Knee to Chest Stretch  3 reps;30 seconds    Lower Trunk Rotation  5 reps;10 seconds    Pelvic Tilt  20 reps    Other Lumbar Stretch Exercise  Standing QL stretch at wall 30 sec x2 bil;       Lumbar  Exercises: Seated   Other Seated Lumbar Exercises  --      Lumbar Exercises: Supine   Ab Set  10 reps    Clam  20 reps    Clam Limitations  RTB    Bent Knee Raise  20 reps    Other Supine Lumbar Exercises  Shoulder Flexion AAROM/ cane for lat stretch 5 sec x10;     Other Supine Lumbar Exercises  Hip Add ball squeeze, supine x20;       Modalities   Modalities  Moist Heat      Moist Heat Therapy   Number Minutes Moist Heat  10 Minutes    Moist Heat Location  Lumbar Spine   thoracic/lumbar     Manual Therapy   Manual Therapy  Soft tissue mobilization    Soft tissue mobilization  Bil Lumbar and thoracic paraspinals,                PT Short  Term Goals - 10/18/18 1315      PT SHORT TERM GOAL #1   Title  Pt to be independent with initial HEP    Time  2    Period  Weeks    Status  New    Target Date  11/01/18      PT SHORT TERM GOAL #2   Title  Pt to report decreased pain to 0-4/10 with movement.     Time  2    Period  Weeks    Status  New    Target Date  11/01/18        PT Long Term Goals - 10/18/18 1316      PT LONG TERM GOAL #1   Title  Pt to be independent with final HEP    Time  6    Period  Weeks    Status  New    Target Date  11/29/18      PT LONG TERM GOAL #2   Title  Pt to report decreased pain to 0-3/10 with activity and transfers    Time  6    Period  Weeks    Status  New    Target Date  11/29/18      PT LONG TERM GOAL #3   Title  Pt to demo improved lumbar and thoracic ROM to be WNL, to improve ability for IADLS and ADLS.     Time  6    Period  Weeks    Status  New    Target Date  11/29/18            Plan - 10/25/18 1558    Clinical Impression Statement  Pt with soreness with STM today in thoracic paraspinals, but seems to be less painful than previous session. Pt still having significant pain with transfers. Able to perform light strengthening today without increased pain. Plan to progress as pt able.     Rehab Potential  Good     PT Frequency  2x / week    PT Duration  6 weeks    PT Treatment/Interventions  ADLs/Self Care Home Management;Cryotherapy;Electrical Stimulation;Iontophoresis 4mg /ml Dexamethasone;Functional mobility training;Stair training;Gait training;DME Instruction;Ultrasound;Traction;Moist Heat;Therapeutic activities;Therapeutic exercise;Balance training;Neuromuscular re-education;Patient/family education;Passive range of motion;Manual techniques;Dry needling    Consulted and Agree with Plan of Care  Patient       Patient will benefit from skilled therapeutic intervention in order to improve the following deficits and impairments:  Abnormal gait, Pain, Improper body mechanics, Decreased mobility, Increased muscle spasms, Postural dysfunction, Decreased range of motion, Decreased strength, Decreased endurance, Decreased activity tolerance, Difficulty walking, Impaired flexibility  Visit Diagnosis: Pain in thoracic spine     Problem List Patient Active Problem List   Diagnosis Date Noted  . History of adenomatous polyp of colon 07/23/2015  . Hyperglycemia 03/29/2015  . Raynaud phenomenon 10/19/2014  . Syncope 10/19/2014  . Hyperlipidemia 10/18/2014  . Glaucoma 01/05/2011  . Essential hypertension 03/11/2007  . Osteopenia 03/11/2007    Lyndee Hensen, PT, DPT 3:59 PM  10/25/18    [facility] [place], [place], [address] Phone: [phone]   Fax:  [phone]  Name: MYLYNN DINH MRN: [id number] Date of Birth: 10-28-1940

## 2018-10-26 ENCOUNTER — Encounter: Payer: Self-pay | Admitting: Family Medicine

## 2018-10-27 ENCOUNTER — Ambulatory Visit: Payer: Medicare HMO | Admitting: Physical Therapy

## 2018-10-27 ENCOUNTER — Encounter: Payer: Self-pay | Admitting: Physical Therapy

## 2018-10-27 DIAGNOSIS — M546 Pain in thoracic spine: Secondary | ICD-10-CM

## 2018-10-27 MED ORDER — TIZANIDINE HCL 2 MG PO CAPS: 2.0000 mg | ORAL_CAPSULE | Freq: Three times a day (TID) | ORAL | 1 refills | Status: DC | PRN

## 2018-10-27 NOTE — Therapy (Signed)
[facility] [address] [place], [place], [address] Phone: [phone]   Fax:  [phone]  Physical Therapy Treatment  Patient Details  Name: Megan Olson MRN: [id number] Date of Birth: 1941/01/17 Referring Provider (PT): Garret Reddish   Encounter Date: 10/27/2018  PT End of Session - 10/27/18 1316    Visit Number  4    Number of Visits  12    Date for PT Re-Evaluation  11/29/18    Authorization Type  Aetna Medicare    PT Start Time  1304    PT Stop Time  1345    PT Time Calculation (min)  41 min    Activity Tolerance  Patient tolerated treatment well    Behavior During Therapy  [facility] for tasks assessed/performed       Past Medical History:  Diagnosis Date  . Glaucoma   . HYPERTENSION 03/11/2007  . OSTEOPENIA 03/11/2007    Past Surgical History:  Procedure Laterality Date  . BREAST EXCISIONAL BIOPSY Right 2000  . BREAST LUMPECTOMY  1990   benign  . DILATION AND CURETTAGE OF UTERUS     bleeding at menopause. No uterine cancer  . TONSILLECTOMY     age 89    There were no vitals filed for this visit.  Subjective Assessment - 10/27/18 1314    Subjective  Pt states continued pain and soreness. She obtained script for muscle relaxer from Dr. Yong Channel, has not taken yet.     Currently in Pain?  Yes    Pain Score  6     Pain Location  Back    Pain Orientation  Right;Left    Pain Descriptors / Indicators  Aching    Pain Type  Acute pain    Pain Onset  More than a month ago    Pain Frequency  Intermittent                       [facility] Adult PT Treatment/Exercise - 10/27/18 1316      Exercises   Exercises  Lumbar      Lumbar Exercises: Stretches   Active Hamstring Stretch  3 reps;30 seconds    Active Hamstring Stretch Limitations  seated    Single Knee to Chest Stretch  3 reps;30 seconds    Lower Trunk Rotation  --    Pelvic Tilt  20 reps    Other Lumbar Stretch Exercise  --      Lumbar Exercises:  Aerobic   Stationary Bike  L1 x 8 min      Lumbar Exercises: Supine   Ab Set  --    Clam  20 reps    Clam Limitations  RTB    Bent Knee Raise  20 reps    Other Supine Lumbar Exercises  Shoulder Flexion AAROM/ cane for lat stretch 5 sec x10;     Other Supine Lumbar Exercises  Hip Add ball squeeze, supine x20;       Modalities   Modalities  Moist Heat      Moist Heat Therapy   Moist Heat Location  --      Manual Therapy   Manual Therapy  --    Soft tissue mobilization  --               PT Short Term Goals - 10/18/18 1315      PT SHORT TERM GOAL #1   Title  Pt to be independent with initial HEP  Time  2    Period  Weeks    Status  New    Target Date  11/01/18      PT SHORT TERM GOAL #2   Title  Pt to report decreased pain to 0-4/10 with movement.     Time  2    Period  Weeks    Status  New    Target Date  11/01/18        PT Long Term Goals - 10/18/18 1316      PT LONG TERM GOAL #1   Title  Pt to be independent with final HEP    Time  6    Period  Weeks    Status  New    Target Date  11/29/18      PT LONG TERM GOAL #2   Title  Pt to report decreased pain to 0-3/10 with activity and transfers    Time  6    Period  Weeks    Status  New    Target Date  11/29/18      PT LONG TERM GOAL #3   Title  Pt to demo improved lumbar and thoracic ROM to be WNL, to improve ability for IADLS and ADLS.     Time  6    Period  Weeks    Status  New    Target Date  11/29/18            Plan - 10/27/18 2140    Clinical Impression Statement  Pt continues to have significant pain with transfers, sneezing, coughing,  and increased activity. She has minimal pain with standing. She has been able to perform light ther ex wtihout increased pain. Pt will start muscle relaxer this weekend. Plan to assess next week, and will likely refer to sports med MD if pts pain not improving.     Rehab Potential  Good    PT Frequency  2x / week    PT Duration  6 weeks    PT  Treatment/Interventions  ADLs/Self Care Home Management;Cryotherapy;Electrical Stimulation;Iontophoresis 4mg /ml Dexamethasone;Functional mobility training;Stair training;Gait training;DME Instruction;Ultrasound;Traction;Moist Heat;Therapeutic activities;Therapeutic exercise;Balance training;Neuromuscular re-education;Patient/family education;Passive range of motion;Manual techniques;Dry needling    Consulted and Agree with Plan of Care  Patient       Patient will benefit from skilled therapeutic intervention in order to improve the following deficits and impairments:  Abnormal gait, Pain, Improper body mechanics, Decreased mobility, Increased muscle spasms, Postural dysfunction, Decreased range of motion, Decreased strength, Decreased endurance, Decreased activity tolerance, Difficulty walking, Impaired flexibility  Visit Diagnosis: Pain in thoracic spine     Problem List Patient Active Problem List   Diagnosis Date Noted  . History of adenomatous polyp of colon 07/23/2015  . Hyperglycemia 03/29/2015  . Raynaud phenomenon 10/19/2014  . Syncope 10/19/2014  . Hyperlipidemia 10/18/2014  . Glaucoma 01/05/2011  . Essential hypertension 03/11/2007  . Osteopenia 03/11/2007    Lyndee Hensen, PT, DPT 9:44 PM  10/27/18    [facility] [place], [place], [address] Phone: [phone]   Fax:  [phone]  Name: Megan Olson MRN: [id number] Date of Birth: 1941/04/16

## 2018-10-31 ENCOUNTER — Telehealth: Payer: Self-pay | Admitting: Family Medicine

## 2018-10-31 NOTE — Telephone Encounter (Signed)
Copied from [place]. Topic: General - Inquiry >> Oct 31, 2018  8:45 AM Conception Chancy, NT wrote: Reason for CRM: patient is calling and states that she has multiple questions for Dr. Yong Channel and Roselyn Reef in regards to her back. She states that therapy is not helping as well as tizanidine (ZANAFLEX) 2 MG capsule is not helping either. She states that her blood pressure this morning was 140/84 with heart rate of 82 and that is abnormal for her. She is wanting to know if its best to see a neurosurgeon and get a MRI done instead of continuing with therapy. Requesting a call back please advise.

## 2018-10-31 NOTE — Telephone Encounter (Signed)
Lets start with Dr. Paulla Fore referral please

## 2018-11-01 ENCOUNTER — Encounter: Payer: Medicare HMO | Admitting: Physical Therapy

## 2018-11-01 ENCOUNTER — Other Ambulatory Visit: Payer: Self-pay

## 2018-11-01 DIAGNOSIS — M549 Dorsalgia, unspecified: Secondary | ICD-10-CM

## 2018-11-01 NOTE — Telephone Encounter (Signed)
Called patient and spoke with her. She is scheduled to see Dr. Paulla Fore this Thursday

## 2018-11-03 ENCOUNTER — Encounter: Payer: Self-pay | Admitting: Sports Medicine

## 2018-11-03 ENCOUNTER — Ambulatory Visit: Payer: Medicare HMO | Admitting: Sports Medicine

## 2018-11-03 ENCOUNTER — Ambulatory Visit (INDEPENDENT_AMBULATORY_CARE_PROVIDER_SITE_OTHER): Payer: Medicare HMO

## 2018-11-03 ENCOUNTER — Encounter: Payer: Medicare HMO | Admitting: Physical Therapy

## 2018-11-03 VITALS — BP 150/86 | HR 76 | Ht 63.75 in | Wt 110.6 lb

## 2018-11-03 DIAGNOSIS — M9901 Segmental and somatic dysfunction of cervical region: Secondary | ICD-10-CM | POA: Diagnosis not present

## 2018-11-03 DIAGNOSIS — M4802 Spinal stenosis, cervical region: Secondary | ICD-10-CM | POA: Diagnosis not present

## 2018-11-03 DIAGNOSIS — S22089A Unspecified fracture of T11-T12 vertebra, initial encounter for closed fracture: Secondary | ICD-10-CM | POA: Diagnosis not present

## 2018-11-03 DIAGNOSIS — M549 Dorsalgia, unspecified: Secondary | ICD-10-CM | POA: Diagnosis not present

## 2018-11-03 DIAGNOSIS — M9903 Segmental and somatic dysfunction of lumbar region: Secondary | ICD-10-CM

## 2018-11-03 DIAGNOSIS — M9905 Segmental and somatic dysfunction of pelvic region: Secondary | ICD-10-CM | POA: Diagnosis not present

## 2018-11-03 DIAGNOSIS — M9902 Segmental and somatic dysfunction of thoracic region: Secondary | ICD-10-CM

## 2018-11-03 DIAGNOSIS — M546 Pain in thoracic spine: Secondary | ICD-10-CM | POA: Diagnosis not present

## 2018-11-03 DIAGNOSIS — M5030 Other cervical disc degeneration, unspecified cervical region: Secondary | ICD-10-CM | POA: Diagnosis not present

## 2018-11-03 DIAGNOSIS — M9908 Segmental and somatic dysfunction of rib cage: Secondary | ICD-10-CM | POA: Diagnosis not present

## 2018-11-03 NOTE — Progress Notes (Signed)
Megan Olson. Olson, Megan at [facility] - 78 y.o. female MRN [id number]  Date of birth: 08-27-1941  Visit Date: November 06, 2018  PCP: Megan Olp, MD   Referred by: Megan Olp, MD  SUBJECTIVE:  Chief Complaint  Patient presents with  . New Patient (Initial Visit)    Back pain.  Referred by Dr. Yong Olson.  Zanaflex 2 mg.  Has been doing PT w/ Lauren but not helping.    HPI: Patient presents for several months of worsening midthoracic pain.  She reports it began following trimming trees with a pair shears which she has been over for the majority of several days.  She did not have any focal incident but did report that she has some discomfort following this activity.  She denies any trauma or falls.  His pain has been keeping her awake at night.  She has been working with physical therapy and it has been mainly helpful but occasionally exacerbating.  She continues to have persistent symptoms that will not relent.  She is having pain with bending, twisting, standing.  Transitioning for sit to stand is the most significant.  Does seem to be worse following prolonged sitting.  REVIEW OF SYSTEMS: Denies fevers, chills, recent weight gain or weight loss.  No night sweats. No significant nighttime awakenings due to this issue. Pt denies any change in bowel or bladder habits, muscle weakness, numbness or falls associated with this pain. Otherwise 12 point review of systems performed and is negative   HISTORY:  Prior history reviewed and updated per electronic medical record.  Social History   Occupational History  . Not on file  Tobacco Use  . Smoking status: Former Smoker    Packs/day: 0.50    Years: 7.00    Pack years: 3.50    Types: Cigarettes    Last attempt to quit: 01/04/1961    Years since quitting: 57.8  . Smokeless tobacco: Never Used  Substance and Sexual Activity  . Alcohol use: Yes   Alcohol/week: 1.0 standard drinks    Types: 1 Standard drinks or equivalent per week  . Drug use: No  . Sexual activity: Not Currently   Social History   Social History Narrative   Married. Lives with husband (patient of Dr. Yong Olson). 1 son. No grandkids. 1 granddog.       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 Glaucoma   . HYPERTENSION 03/11/2007  . OSTEOPENIA 03/11/2007   Past Surgical History:  Procedure Laterality Date  . BREAST EXCISIONAL BIOPSY Right 2000  . BREAST LUMPECTOMY  1990   benign  . DILATION AND CURETTAGE OF UTERUS     bleeding at menopause. No uterine cancer  . TONSILLECTOMY     age 76   family history includes Alcohol abuse in her father; Arthritis in her mother and sister; COPD in her brother; Cancer in her sister; Glaucoma in her mother and sister; Hashimoto's thyroiditis in her sister; Heart disease in her mother and sister; Hyperlipidemia in her brother; Hypertension in her brother, sister, and son; Stroke in her maternal grandmother; Suicidality in her father. There is no history of Colon cancer or Colon polyps. Recent Labs    04/04/18 0849 10/10/18 0822  HGBA1C 6.3  5.6  CREATININE 0.68  --   CALCIUM 9.2  --   AST 12  --   ALT 12  --     OBJECTIVE:  VS:  HT:5' 3.75" (161.9 cm)   WT:110 lb 9.6 oz (50.2 kg)  BMI:19.14    BP:(!) 150/86  HR:76bpm  TEMP: ( )  RESP:98 %   PHYSICAL EXAM: CONSTITUTIONAL: Well-developed, Well-nourished and In no acute distress EYES: Pupils are equal., EOM intact without nystagmus. and No scleral icterus. Psychiatric: Alert & appropriately interactive. and Not depressed or anxious appearing. EXTREMITY EXAM: Warm and well perfused  Adult female.  In no acute distress.  Alert and appropriate.  She has pain with going from sit to stand.  There is no focal pain  with palpation of the mid spine.  No pain with tuning fork test over the spine.  Pain is worse with going from sit to stand.  In the supine position she does have a bilateral hip flexor contracture.  She has marked kyphosis at rest.   Good cervical and lumbar range of motion although there are functional limitations. Negative Spurling's compression test and Lhermitte's compression test.   Upper extremity lower extremity strength is 5/5 in all myotomes. Normal sensation Significant anterior chain dominant posture.   Cervical and thoracic x-rays of the spine reviewed today.  There is marked osteopenia with questionable compression deformity within the lower thoracic spine.     ASSESSMENT  1. Back pain, unspecified back location, unspecified back pain laterality, unspecified chronicity   2. Pain in thoracic spine   3. Somatic dysfunction of thoracic region   4. Somatic dysfunction of rib cage region   5. Somatic dysfunction of cervical region   6. Somatic dysfunction of lumbar region   7. Somatic dysfunction of pelvis region     PROCEDURES:  PROCEDURE NOTE: OSTEOPATHIC MANIPULATION  The decision today to treat with Osteopathic Manipulative Therapy (OMT) was based on physical exam findings. Verbal consent was obtained following a discussion with the patient regarding the of risks, benefits and potential side effects, including an acute pain flare,post manipulation soreness and need for repeat treatments. Additionally, we specifically discussed the minimal risk of  injury to neurovascular structures associated with Cervical manipulation. Contraindications to OMT: NONE Manipulation was performed as below: Regions Treated & Osteopathic Exam Findings C2 through C4 FRS left C6 FRS right T2 - T5 neutral side bent right, rotated left Ribs 6 posterior right L3 FRS left Left anterior innominate  OMT Techniques Used: HVLA muscle energy myofascial release HVLA - Long Lever  The patient  tolerated the treatment well and reported Improved symptoms following treatment today. Patient was given medications, exercises, stretches and lifestyle modifications per AVS and verbally.      PLAN:  Pertinent additional documentation may be included in corresponding procedure notes, imaging studies, problem based documentation and patient instructions.  No problem-specific Assessment & Plan notes found for this encounter.  Long discussion today.  Given the duration of symptoms and insidious onset the likelihood of this being a significant compression deformity is low especially with a negative tuning fork test although we cannot definitively rule this out.  The fact that it also came on gradually and has been present for as long as it has suggest this is more functional in nature.  We did gentle osteopathic manipulation including HVLA to the thoracic spine that did cause some mild muscle spasms that subsequently resolved.  Ultimately she has markedly tight hip flexors as well that are  contributing to some of this.  The position of ease from the activity that she was performing with a seem to exacerbate is in a hip flexed anterior chain collapse position which is where she is most comfortable.  Continue previously prescribed home exercise program.  Discussed the underlying features of tight hip flexors leading to crouched, fetal like position that results in spinal column compression.  Including lumbar hyperflexion with hypermobility, thoracic flexion with restrictive rotation and cervical lordosis reversal.   Osteopathic manipulation was performed today based on physical exam findings.  Patient was counseled on the purpose and expected outcome of osteopathic manipulation and understands that a single treatment may not provide permanent long lasting relief.  They understand that home therapeutic exercises are critical part of the healing/treatment process and will continue with self treatment between  now and their next visit as outlined.  The patient understands that the frequency of visits is meant to provide a stimulus to promote the body's own ability to heal and is not meant to be the sole means for improvement in their symptoms. Activity modifications and the importance of avoiding exacerbating activities (limiting pain to no more than a 4 / 10 during or following activity) recommended and discussed.  Discussed red flag symptoms that warrant earlier emergent evaluation and patient voices understanding.  At follow up will plan to consider: repeat osteopathic manipulation and Consideration of further advanced diagnostic imaging with MRI of the thoracic spine   Return in about 2 weeks (around 11/17/2018).          Gerda Diss, [facility] Physician

## 2018-11-05 ENCOUNTER — Encounter: Payer: Self-pay | Admitting: Sports Medicine

## 2018-11-06 NOTE — Progress Notes (Signed)
My chart message sent: This was the area pointed out to you that is questionable.  Please let us know if you are not having any significant improvement and we can discuss obtaining further diagnostic imaging.  If you are feeling better after our visit last week, holding off on further imaging is acceptable.

## 2018-11-08 ENCOUNTER — Ambulatory Visit: Payer: Medicare HMO | Admitting: Physical Therapy

## 2018-11-08 ENCOUNTER — Encounter: Payer: Self-pay | Admitting: Physical Therapy

## 2018-11-08 DIAGNOSIS — M546 Pain in thoracic spine: Secondary | ICD-10-CM

## 2018-11-08 NOTE — Therapy (Signed)
[facility] [address] [place], [place], [address] Phone: [phone]   Fax:  [phone]  Physical Therapy Treatment  Patient Details  Name: Megan Olson MRN: [id number] Date of Birth: May 11, 1941 Referring Provider (PT): Garret Reddish   Encounter Date: 11/08/2018  PT End of Session - 11/08/18 1306    Visit Number  5    Number of Visits  12    Date for PT Re-Evaluation  11/29/18    Authorization Type  [employer]na Medicare    PT Start Time  1300    PT Stop Time  1347    PT Time Calculation (min)  47 min    Activity Tolerance  Patient tolerated treatment well    Behavior During Therapy  [facility] for tasks assessed/performed       Past Medical History:  Diagnosis Date  . Glaucoma   . HYPERTENSION 03/11/2007  . OSTEOPENIA 03/11/2007    Past Surgical History:  Procedure Laterality Date  . BREAST EXCISIONAL BIOPSY Right 2000  . BREAST LUMPECTOMY  1990   benign  . DILATION AND CURETTAGE OF UTERUS     bleeding at menopause. No uterine cancer  . TONSILLECTOMY     age 65    There were no vitals filed for this visit.  Subjective Assessment - 11/08/18 1302    Subjective  Pt has seen MD and had x-rays since last appt. She has continued pain with tranfers, sneezing.     Currently in Pain?  Yes    Pain Score  6     Pain Location  Back    Pain Orientation  Right;Left    Pain Descriptors / Indicators  Aching    Pain Type  Acute pain    Pain Onset  More than a month ago    Pain Frequency  Intermittent                       [facility] Adult PT Treatment/Exercise - 11/08/18 1307      Exercises   Exercises  Lumbar      Lumbar Exercises: Stretches   Active Hamstring Stretch  3 reps;30 seconds    Active Hamstring Stretch Limitations  seated    Single Knee to Chest Stretch  3 reps;30 seconds    Pelvic Tilt  --    Piriformis Stretch  2 reps;30 seconds    Piriformis Stretch Limitations  seated    Other Lumbar Stretch Exercise   seated QL stretch 30 sec x 2 bil;       Lumbar Exercises: Aerobic   Stationary Bike  --      Lumbar Exercises: Seated   Other Seated Lumbar Exercises  Seated thoracic extension x10 with manual assist.       Lumbar Exercises: Supine   Clam  --    Clam Limitations  --    Bent Knee Raise  --    Other Supine Lumbar Exercises  Decompression position x 2 min with education on performing at home.     Other Supine Lumbar Exercises  --      Lumbar Exercises: Sidelying   Hip Abduction  20 reps;Both      Modalities   Modalities  Moist Heat      Manual Therapy   Manual Therapy  Soft tissue mobilization;Passive ROM;Manual Traction    Manual therapy comments  skilled palpation and monitoring of soft tissue with dry neeling.     Soft tissue mobilization  DTM  to R lumbar and thoracic paraspinals and mulifidi    Manual Traction  Long leg distraction 10 sec x 6 bil;        Trigger Point Dry Needling - 11/08/18 1407    Consent Given?  Yes    Education Handout Provided  Yes    Muscles Treated Lower Body  --   R lumbar multifidus L3/L4           PT Education - 11/08/18 1402    Education Details  Education on dry needling, HEP updated    Person(s) Educated  Patient    Methods  Explanation;Handout    Comprehension  Verbalized understanding       PT Short Term Goals - 11/08/18 1403      PT SHORT TERM GOAL #1   Title  Pt to be independent with initial HEP    Time  2    Period  Weeks    Status  Achieved      PT SHORT TERM GOAL #2   Title  Pt to report decreased pain to 0-4/10 with movement.     Time  2    Period  Weeks    Status  On-going        PT Long Term Goals - 10/18/18 1316      PT LONG TERM GOAL #1   Title  Pt to be independent with final HEP    Time  6    Period  Weeks    Status  New    Target Date  11/29/18      PT LONG TERM GOAL #2   Title  Pt to report decreased pain to 0-3/10 with activity and transfers    Time  6    Period  Weeks    Status  New     Target Date  11/29/18      PT LONG TERM GOAL #3   Title  Pt to demo improved lumbar and thoracic ROM to be WNL, to improve ability for IADLS and ADLS.     Time  6    Period  Weeks    Status  New    Target Date  11/29/18            Plan - 11/08/18 1404    Clinical Impression Statement  Pt with increased sorenes and tightness in R lumbar musculature today, addressed with dry needling and manual therapy. Pt with minimal soreness during, but does have soreness after dry needling in R low lumbar region. Pt continues to have most pain with transfers, and has great deal of difficulty with this. Pt educated on increasing forward trunk lean and weight shift with sit to stand, practiced today. HEP updated and reviewed. Plan to progress as tolerated. Patient Consent: After explanation of Trigger Point Dry Needling (TDN)  rationale, procedures, outcomes, and potential side effects, patient verbalized consent to TDN treatment. Post treatment instructions: Patient instructed to expect mild to moderate muscle soreness this evening and tomorrow. Pt instructed to continue prescribed home exercise program. If dry needling over lung field, patient instructed of signs and symptoms of pneumothorax. Although unlikely, patient educated on seeking immediate medical attention, should symptoms occur. Pt verbalized understanding of these instructions.     Rehab Potential  Good    PT Frequency  2x / week    PT Duration  6 weeks    PT Treatment/Interventions  ADLs/Self Care Home Management;Cryotherapy;Electrical Stimulation;Iontophoresis 4mg /ml Dexamethasone;Functional mobility training;Stair training;Gait training;DME Instruction;Ultrasound;Traction;Moist Heat;Therapeutic activities;Therapeutic exercise;Balance  training;Neuromuscular re-education;Patient/family education;Passive range of motion;Manual techniques;Dry needling    Consulted and Agree with Plan of Care  Patient       Patient will benefit from skilled  therapeutic intervention in order to improve the following deficits and impairments:  Abnormal gait, Pain, Improper body mechanics, Decreased mobility, Increased muscle spasms, Postural dysfunction, Decreased range of motion, Decreased strength, Decreased endurance, Decreased activity tolerance, Difficulty walking, Impaired flexibility  Visit Diagnosis: Pain in thoracic spine     Problem List Patient Active Problem List   Diagnosis Date Noted  . History of adenomatous polyp of colon 07/23/2015  . Hyperglycemia 03/29/2015  . Raynaud phenomenon 10/19/2014  . Syncope 10/19/2014  . Hyperlipidemia 10/18/2014  . Glaucoma 01/05/2011  . Essential hypertension 03/11/2007  . Osteopenia 03/11/2007    Lyndee Hensen, PT, DPT 2:11 PM  11/08/18    [facility] [place], [place], [address] Phone: [phone]   Fax:  [phone]  Name: Megan Olson MRN: [id number] Date of Birth: [birth year]

## 2018-11-08 NOTE — Patient Instructions (Signed)
Access Code: [email or web address]  URL: [email or web address]  Date: 11/08/2018  Prepared by: Lyndee Hensen   Exercises  Single Knee to Chest Stretch - 3 reps - 30 hold - 2x daily  Supine Lower Trunk Rotation - 10 reps - 5 hold - 2x daily  Standing Sidebending with Chair Support - 3 reps - 30 hold - 2x daily  Seated Hamstring Stretch - 3 reps - 30 hold - 2x daily  Supine Shoulder Flexion with Dowel - 10 reps - 1 sets - 2x daily  Supine Posterior Pelvic Tilt - 10 reps - 1 sets - 2x daily  Sidelying Hip Abduction - 10 reps - 2 sets - 1x daily  Seated Piriformis Stretch with Trunk Bend - 3 reps - 30 hold - 2x daily

## 2018-11-10 ENCOUNTER — Ambulatory Visit: Payer: Medicare HMO | Admitting: Physical Therapy

## 2018-11-10 DIAGNOSIS — M546 Pain in thoracic spine: Secondary | ICD-10-CM

## 2018-11-13 ENCOUNTER — Encounter: Payer: Self-pay | Admitting: Physical Therapy

## 2018-11-13 NOTE — Therapy (Addendum)
[facility] [address] [place], [place], [address] Phone: [phone]   Fax:  [phone]  Physical Therapy Treatment  Patient Details  Name: Megan Olson MRN: [id number] Date of Birth: 1941/09/11 Referring Provider (PT): Garret Reddish   Encounter Date: 11/10/2018  PT End of Session - 11/13/18 1408    Visit Number  6    Number of Visits  12    Date for PT Re-Evaluation  11/29/18    Authorization Type  [employer]na Medicare    PT Start Time  1430    PT Stop Time  1510    PT Time Calculation (min)  40 min    Activity Tolerance  Patient tolerated treatment well    Behavior During Therapy  [facility] for tasks assessed/performed       Past Medical History:  Diagnosis Date  . Glaucoma   . HYPERTENSION 03/11/2007  . OSTEOPENIA 03/11/2007    Past Surgical History:  Procedure Laterality Date  . BREAST EXCISIONAL BIOPSY Right 2000  . BREAST LUMPECTOMY  1990   benign  . DILATION AND CURETTAGE OF UTERUS     bleeding at menopause. No uterine cancer  . TONSILLECTOMY     age 58    There were no vitals filed for this visit.  Subjective Assessment - 11/13/18 1402    Subjective  Pt states continued, significant pain. She notes increased pain in low lumbar region as well.     Currently in Pain?  Yes    Pain Score  5     Pain Location  Back    Pain Orientation  Right;Left    Pain Descriptors / Indicators  Aching    Pain Type  Acute pain    Pain Onset  More than a month ago    Pain Frequency  Intermittent                       [facility] Adult PT Treatment/Exercise - 11/13/18 0001      Exercises   Exercises  Lumbar      Lumbar Exercises: Stretches   Active Hamstring Stretch  3 reps;30 seconds    Active Hamstring Stretch Limitations  manual    Single Knee to Chest Stretch  3 reps;30 seconds    Lower Trunk Rotation Limitations  increased pain on L;     Pelvic Tilt  20 reps    Piriformis Stretch  --    Piriformis Stretch  Limitations  --    Other Lumbar Stretch Exercise  --      Lumbar Exercises: Seated   Other Seated Lumbar Exercises  --      Lumbar Exercises: Supine   Other Supine Lumbar Exercises  --      Lumbar Exercises: Sidelying   Hip Abduction  --      Modalities   Modalities  Ultrasound      Ultrasound   Ultrasound Location  R low lumbar paraspinal    Ultrasound Parameters  contiuous, 1.0 w/cm2    Ultrasound Goals  Pain      Manual Therapy   Manual Therapy  Soft tissue mobilization;Passive ROM;Manual Traction    Manual therapy comments  --    Soft tissue mobilization  DTM to bil thoracic and lumbar paraspinal region    Manual Traction  --               PT Short Term Goals - 11/08/18 1403      PT  SHORT TERM GOAL #1   Title  Pt to be independent with initial HEP    Time  2    Period  Weeks    Status  Achieved      PT SHORT TERM GOAL #2   Title  Pt to report decreased pain to 0-4/10 with movement.     Time  2    Period  Weeks    Status  On-going        PT Long Term Goals - 11/13/18 1409      PT LONG TERM GOAL #1   Title  Pt to be independent with final HEP    Time  6    Period  Weeks    Status  Partially Met      PT LONG TERM GOAL #2   Title  Pt to report decreased pain to 0-3/10 with activity and transfers    Time  6    Period  Weeks    Status  On-going      PT LONG TERM GOAL #3   Title  Pt to demo improved lumbar and thoracic ROM to be WNL, to improve ability for IADLS and ADLS.     Time  6    Period  Weeks    Status  On-going            Plan - 11/13/18 1409    Clinical Impression Statement  Pt continues to have significant soreness in back. She has tenderness in thoracic paraspinal region. She also continues to have significant soreness at R low lumbar region, still tender in area address last visit with dry needling. She has been unable to progress ther ex, due to pain. Pt with most pain with transfers, sit to standi, and supine to/from sit. Pt  able to perform ther ex, and HEP, but states increased pain after performing. Pt has not responded to PT treatments, and has not had any reduction in pain. Pt will be put on hold at this time, until she sees MD again at end of next week for follow up.  Pt in agreement with plan.     Rehab Potential  Good    PT Frequency  2x / week    PT Duration  6 weeks    PT Treatment/Interventions  ADLs/Self Care Home Management;Cryotherapy;Electrical Stimulation;Iontophoresis '4mg'$ /ml Dexamethasone;Functional mobility training;Stair training;Gait training;DME Instruction;Ultrasound;Traction;Moist Heat;Therapeutic activities;Therapeutic exercise;Balance training;Neuromuscular re-education;Patient/family education;Passive range of motion;Manual techniques;Dry needling    Consulted and Agree with Plan of Care  Patient       Patient will benefit from skilled therapeutic intervention in order to improve the following deficits and impairments:  Abnormal gait, Pain, Improper body mechanics, Decreased mobility, Increased muscle spasms, Postural dysfunction, Decreased range of motion, Decreased strength, Decreased endurance, Decreased activity tolerance, Difficulty walking, Impaired flexibility  Visit Diagnosis: Pain in thoracic spine     Problem List Patient Active Problem List   Diagnosis Date Noted  . History of adenomatous polyp of colon 07/23/2015  . Hyperglycemia 03/29/2015  . Raynaud phenomenon 10/19/2014  . Syncope 10/19/2014  . Hyperlipidemia 10/18/2014  . Glaucoma 01/05/2011  . Essential hypertension 03/11/2007  . Osteopenia 03/11/2007    Lyndee Hensen, PT, DPT 2:18 PM  11/13/18    [facility] [place], [place], [address] Phone: [phone]   Fax:  [phone]  Name: Megan Olson MRN: [id number] Date of Birth: 07-03-1941   PHYSICAL THERAPY DISCHARGE SUMMARY  Visits from Start of Care:6  Plan: Patient agrees to discharge.   Patient goals were not met. Patient is being discharged due to a change in medical status.  ?????     Pt last seen 11/10/18. Pt found to have Multiple Myeloma. Has not returned to PT, going through treatment.    Lyndee Hensen, PT, DPT 11:55 AM  03/20/19

## 2018-11-15 ENCOUNTER — Encounter: Payer: Medicare HMO | Admitting: Physical Therapy

## 2018-11-16 ENCOUNTER — Encounter: Payer: Self-pay | Admitting: Sports Medicine

## 2018-11-16 ENCOUNTER — Ambulatory Visit: Payer: Medicare HMO | Admitting: Sports Medicine

## 2018-11-16 VITALS — BP 154/82 | HR 85 | Ht 63.75 in | Wt 112.6 lb

## 2018-11-16 DIAGNOSIS — S22080D Wedge compression fracture of T11-T12 vertebra, subsequent encounter for fracture with routine healing: Secondary | ICD-10-CM | POA: Diagnosis not present

## 2018-11-16 DIAGNOSIS — M546 Pain in thoracic spine: Secondary | ICD-10-CM | POA: Diagnosis not present

## 2018-11-16 DIAGNOSIS — M549 Dorsalgia, unspecified: Secondary | ICD-10-CM | POA: Diagnosis not present

## 2018-11-16 NOTE — Progress Notes (Signed)
Megan Olson. , Megan Olson - 78 y.o. female MRN [id number]  Date of birth: Mar 20, 1941  Visit Date: November 16, 2018  PCP: Megan Olp, MD   Referred by: Megan Olp, MD  SUBJECTIVE:  Chief Complaint  Patient presents with  . Middle Back - Follow-up    XR T-spine 11/03/18. Prescribed Zanaflex, has d/c, now taking IBU and topical analgesic. Minimal improvement with OMT and PT.   Marland Kitchen Lower Back - Follow-up    Worsening LBP.     HPI: Patient is here for follow-up of the above symptoms.  Unfortunately she is actually had slight worsening in her pain.  It seems to be localizing more around the thoracolumbar junction.  She had minimal improvement to no improvement after the osteopathic manipulation at last visit.  No significant exacerbation with this.  Pain is rated as moderate to severe.  She is continue to use heat as needed.  Medications have been minimally helpful and she is discontinued the Zanaflex as well as a topical analgesic.  Physical therapy has not been significantly beneficial.  She continues to have pain with bending twisting standing sit to stand as well as prolonged sitting.  REVIEW OF SYSTEMS: No significant nighttime awakenings due to this issue. Denies fevers, chills, recent weight gain or weight loss.  No night sweats.   HISTORY:  Prior history reviewed and updated per electronic medical record.  Patient Active Problem List   Diagnosis Date Noted  . Pain in thoracic spine 11/03/2018    11/03/2018 XR T-spine IMPRESSION: Osteopenia.  Mild compression fracture at T11, new since 2018.   Marland Kitchen History of adenomatous polyp of colon 07/23/2015    07/2015   . Hyperglycemia 03/29/2015    Peak a1c of 6.3     . Raynaud phenomenon 10/19/2014    Starting age 25   . Syncope 10/19/2014    Related to food or drink with 5-6 episodes since 2005. Several episodes with wine before  dinner though not after. Thought vasovagal vs orthostatic (if becomes dehydrated). States had cardiac monitoring and no abnormalities-difficult to find in chart.    . Hyperlipidemia 10/18/2014    No rx. Fish oil, ASA only. 15% 10 year risk with home BP readings and 2016 lipids   . Glaucoma 01/05/2011    Lumigan, cosopt.        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 Arlina Olson glaucoma specialist   . Essential hypertension 03/11/2007    Amlodipine 5mg . homd cuff verified 04/01/16. White coat HTN in office.    . Osteopenia 03/11/2007    04/13/13 -2.3 AP spine. Vit D. MV 600 + diet.  Fosamax for 3-4 years in past and eventually came off concerned of SE    Social History   Occupational History  . Not on file  Tobacco Use  . Smoking status: Former Smoker    Packs/day: 0.50    Years: 7.00    Pack years: 3.50    Types: Cigarettes    Last attempt to quit: 01/04/1961    Years since quitting: 57.9  . Smokeless tobacco: Never Used  Substance and Sexual Activity  . Alcohol use: Yes    Alcohol/week: 1.0 standard drinks    Types: 1 Standard drinks or equivalent per week  . Drug use: No  . Sexual activity: Not Currently   Social History   Social History Narrative   Married. Lives  with husband (patient of Dr. Yong Olson). 1 son. No grandkids. 1 granddog.       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 No acute distress.  Alert and appropriate. Her to spine has mild improvements in the paraspinal muscle spasms but this is minimal.  No focal midline tenderness but she has generalized back pain especially on from sit to stand.  Negative straight leg raises bilaterally.  She is uncomfortable but in no  acute distress   ASSESSMENT:   1. Pain in thoracic spine   2. Back pain, unspecified back location, unspecified back pain laterality, unspecified chronicity   3. Compression fracture of T11 vertebra with routine healing, subsequent encounter     PROCEDURES:  None  PLAN:  Pertinent additional documentation may be included in corresponding procedure notes, imaging studies, problem based documentation and patient instructions.  No problem-specific Assessment & Plan notes found for this encounter.   I do think she is actually having symptoms more from this T11 compression fracture although it is seemingly subacute.  She is likely a candidate for kyphoplasty given the lack of improvement and duration of symptoms.  MRI of the thoracic spine to be obtained and then will plan to directly refer her to [facility] for kyphoplasty if this is the single isolated finding.  If other findings or question we will plan to follow-up to review these results for the MRI is obtained.  Otherwise 8-week follow-up will be scheduled either way.   Activity modifications and the importance of avoiding exacerbating activities (limiting pain to no more than a 4 / 10 during or following activity) recommended and discussed.  Discussed red flag symptoms that warrant earlier emergent evaluation and patient voices understanding.   No orders of the defined types were placed in this encounter.  Lab Orders  No laboratory test(s) ordered today    Imaging Orders     MR Thoracic Spine Wo Contrast Referral Orders  No referral(s) requested today    Return in about 8 weeks (around 01/11/2019).          Megan Olson, [facility] Sports Medicine Physician

## 2018-11-16 NOTE — Patient Instructions (Signed)
We are ordering an MRI for you today.  The imaging office will be calling you to schedule your appointment after we obtain authorization from your insurance company.   Please be sure you have signed up for MyChart so that we can get your results to you.  We will be in touch with you as soon as we can.  Please know, it can take up to 3-4 business days for the radiologist and Dr. Paulla Fore to have time to review the results and determine the best appropriate action.  If there is something that appears to be surgical or needs a referral to other specialists we will let you know through [place] or telephone.  Otherwise we will plan to schedule a follow up appointment with Dr. Paulla Fore once we have the results.    [facility]: [phone]

## 2018-11-17 ENCOUNTER — Encounter: Payer: Medicare HMO | Admitting: Physical Therapy

## 2018-11-18 ENCOUNTER — Ambulatory Visit: Payer: Medicare HMO | Admitting: Sports Medicine

## 2018-11-23 ENCOUNTER — Encounter: Payer: Self-pay | Admitting: Sports Medicine

## 2018-11-24 ENCOUNTER — Other Ambulatory Visit: Payer: Medicare HMO

## 2018-11-27 ENCOUNTER — Ambulatory Visit
Admission: RE | Admit: 2018-11-27 | Discharge: 2018-11-27 | Disposition: A | Discharge status: Home / Self Care | Payer: Medicare HMO | Source: Ambulatory Visit | Attending: Sports Medicine | Admitting: Sports Medicine

## 2018-11-27 DIAGNOSIS — M5124 Other intervertebral disc displacement, thoracic region: Secondary | ICD-10-CM | POA: Diagnosis not present

## 2018-11-27 DIAGNOSIS — M546 Pain in thoracic spine: Secondary | ICD-10-CM

## 2018-11-27 DIAGNOSIS — S22080D Wedge compression fracture of T11-T12 vertebra, subsequent encounter for fracture with routine healing: Secondary | ICD-10-CM

## 2018-11-28 ENCOUNTER — Ambulatory Visit (INDEPENDENT_AMBULATORY_CARE_PROVIDER_SITE_OTHER): Payer: Medicare HMO | Admitting: Sports Medicine

## 2018-11-28 ENCOUNTER — Other Ambulatory Visit: Payer: Self-pay | Admitting: Sports Medicine

## 2018-11-28 ENCOUNTER — Encounter: Payer: Self-pay | Admitting: Sports Medicine

## 2018-11-28 ENCOUNTER — Telehealth: Payer: Self-pay | Admitting: Family Medicine

## 2018-11-28 VITALS — BP 150/90 | HR 80 | Ht 63.75 in

## 2018-11-28 DIAGNOSIS — G8929 Other chronic pain: Secondary | ICD-10-CM

## 2018-11-28 DIAGNOSIS — M546 Pain in thoracic spine: Secondary | ICD-10-CM

## 2018-11-28 DIAGNOSIS — M899 Disorder of bone, unspecified: Secondary | ICD-10-CM

## 2018-11-28 MED ORDER — HYDROCODONE-ACETAMINOPHEN 5-325 MG PO TABS: 1.0000 | ORAL_TABLET | ORAL | 0 refills | Status: DC | PRN

## 2018-11-28 MED ORDER — SENNOSIDES-DOCUSATE SODIUM 8.6-50 MG PO TABS: 1.0000 | ORAL_TABLET | Freq: Two times a day (BID) | ORAL | 1 refills | Status: DC | PRN

## 2018-11-28 NOTE — Telephone Encounter (Signed)
See note

## 2018-11-28 NOTE — Progress Notes (Signed)
She needs follow up ASAP.  There are findings that need to be reviewed in Person.  I have let Dr. Yong Channel know of the results as well

## 2018-11-28 NOTE — Addendum Note (Signed)
Addended by: Kayren Eaves T on: 11/28/2018 04:19 PM   Modules accepted: Orders

## 2018-11-28 NOTE — Telephone Encounter (Signed)
Copied from [place] [id number]. Topic: Quick Communication - See Telephone Encounter >> Nov 28, 2018  8:42 AM Vernona Rieger wrote: CRM for notification. See Telephone encounter for: 11/28/18.  Olivia Mackie with [facility] called regarding her MRI results. She said that a nurse can call back and get those results from anyone there. She said these were not STAT but they did find something. Please call at [phone]

## 2018-11-28 NOTE — Telephone Encounter (Signed)
[facility] and advised that Dr. Paulla Fore has reviewed results of MRI.

## 2018-11-28 NOTE — Progress Notes (Signed)
Megan Olson. [name], Megan Olson - 78 y.o. female MRN [id number]  Date of birth: 02/14/1941  Visit Date: 11/28/2018  PCP: [name], MD   Referred by: [name], MD  SUBJECTIVE:   Chief Complaint  Patient presents with  . Middle Back - Follow-up    MRI T-spine 11/27/2018.     HPI: Patient is here to follow-up the results of her MRI.  She is continued to have back midthoracic pain that is actually worsening.  She is sitting in a wheelchair today.  She is continue to have significant nighttime disturbances.  She has had chronic constipation for quite some time.  She has noted swelling in her bilateral lower extremities as well over the past several weeks especially in the pretibial region.  REVIEW OF SYSTEMS: Per HPI  HISTORY:  Prior history reviewed and updated per electronic medical record.  Patient Active Problem List   Diagnosis Date Noted  . Pain in thoracic spine 11/03/2018    11/03/2018 XR T-spine IMPRESSION: Osteopenia.  Mild compression fracture at T11, new since 2018.   Marland Kitchen History of adenomatous polyp of colon 07/23/2015    07/2015   . Hyperglycemia 03/29/2015    Peak a1c of 6.3     . Raynaud phenomenon 10/19/2014    Starting age 53   . Syncope 10/19/2014    Related to food or drink with 5-6 episodes since 2005. Several episodes with wine before dinner though not after. Thought vasovagal vs orthostatic (if becomes dehydrated). States had cardiac monitoring and no abnormalities-difficult to find in chart.    . Hyperlipidemia 10/18/2014    No rx. Fish oil, ASA only. 15% 10 year risk with home BP readings and 2016 lipids   . Glaucoma 01/05/2011    Lumigan, cosopt.        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 Arlina Robes glaucoma specialist   . Essential hypertension 03/11/2007    Amlodipine '5mg'$ . homd cuff verified 04/01/16. White coat HTN in office.      . Osteopenia 03/11/2007    04/13/13 -2.3 AP spine. Vit D. MV 600 + diet.  Fosamax for 3-4 years in past and eventually came off concerned of SE    Social History   Occupational History  . Not on file  Tobacco Use  . Smoking status: Former Smoker    Packs/day: 0.50    Years: 7.00    Pack years: 3.50    Types: Cigarettes    Last attempt to quit: 01/04/1961    Years since quitting: 57.9  . Smokeless tobacco: Never Used  Substance and Sexual Activity  . Alcohol use: Yes    Alcohol/week: 1.0 standard drinks    Types: 1 Standard drinks or equivalent per week  . Drug use: No  . Sexual activity: Not Currently   Social History   Social History Narrative   Married. Lives with husband (patient of Dr. Yong Channel). 1 son. No grandkids. 1 granddog.       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 No acute distress.  Alert and appropriate. Sitting in a wheelchair. Moderate insight.   ASSESSMENT:   1. Chronic bilateral thoracic back pain   2. Bone lesion     PROCEDURES:  None  PLAN:  Pertinent additional documentation may be included in corresponding procedure notes, imaging studies, problem based documentation and patient instructions.  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  I did call and talk with the radiologist about further interventions and given the fact of the appearance of this being multiple myeloma we will plan for a iliac crest bone marrow biopsy and a referral for this was placed today.  We additionally placed a referral for oncology for her to be started in the clinic at [facility] long for likely reduction therapy.  Blood work obtained as below and consultation with Dr. Yong Channel.  Pain  medication prescription as well as Senokot for constipation factor provided.    Unfortunately further intervention on her spine at this time from a pain control standpoint is contraindicated.  We will plan to defer further management to oncology.  Avoidance of significant weightbearing activity recommended at this time and emphasized the importance of not falling.  She does have access to a wheelchair and assistive devices but will let us know if she needs a prescription for any of the above.  Will consider prescription for wheelchair, walker and bedside commode.  Activity modifications and the importance of avoiding exacerbating activities (limiting pain to no more than a 4 / 10 during or following activity) recommended and discussed.  Discussed red flag symptoms that warrant earlier emergent evaluation and patient voices understanding.  >50% of this 25 minutes minute visit spent in direct patient counseling and/or coordination of care. Discussion was focused on education regarding the in discussing the pathoetiology and anticipated clinical course of the above condition.  In-depth review of the MRI and all questions were answered to the best of my ability with the limited information obtained.  I did reach out let her ophthalmologist know about this likely diagnosis given she is concerned with the ongoing issues with her eyes.   Meds ordered this encounter  Medications  . HYDROcodone-acetaminophen (NORCO/VICODIN) 5-325 MG tablet    Sig: Take 1 tablet by mouth every 4 (four) hours as needed for moderate pain.    Dispense:  30 tablet    Refill:  0  . senna-docusate (SENOKOT-S) 8.6-50 MG tablet    Sig: Take 1 tablet by mouth 2 (two) times daily as needed for mild constipation.    Dispense:  30 tablet    Refill:  1   Lab Orders     CBC with Differential/Platelet     Comprehensive metabolic panel     Protein Electrophoresis, (serum)     UPEP/TP, 24-Hr Urine     PE and FLC, Serum      Immunoglobulins, QN, A/E/G/M     Lactate Dehydrogenase (LDH)     Beta 2 microglobulin, serum     C-reactive protein Imaging Orders     CT BONE MARROW BIOPSY Referral Orders     Ambulatory referral to Oncology  No follow-ups on file.          Gerda Diss, [place] Sports Medicine Physician

## 2018-11-29 ENCOUNTER — Other Ambulatory Visit: Payer: Self-pay | Admitting: Radiology

## 2018-11-29 LAB — COMPREHENSIVE METABOLIC PANEL
ALK PHOS: 63 U/L (ref 39–117)
ALT: 10 U/L (ref 0–35)
AST: 10 U/L (ref 0–37)
Albumin: 3.1 g/dL — ABNORMAL LOW (ref 3.5–5.2)
BUN: 19 mg/dL (ref 6–23)
CO2: 26 mEq/L (ref 19–32)
Calcium: 8.9 mg/dL (ref 8.4–10.5)
Chloride: 102 mEq/L (ref 96–112)
Creatinine, Ser: 0.68 mg/dL (ref 0.40–1.20)
GFR: 83.73 mL/min (ref >60.00)
Glucose, Bld: 105 mg/dL — ABNORMAL HIGH (ref 70–99)
Potassium: 4.2 mEq/L (ref 3.5–5.1)
Sodium: 137 mEq/L (ref 135–145)
TOTAL PROTEIN: 10.2 g/dL — AB (ref 6.0–8.3)
Total Bilirubin: 0.3 mg/dL (ref 0.2–1.2)

## 2018-11-29 LAB — CBC WITH DIFFERENTIAL/PLATELET
Basophils Absolute: 0 K/uL (ref 0.0–0.1)
Basophils Relative: 0.8 % (ref 0.0–3.0)
Eosinophils Absolute: 0.1 K/uL (ref 0.0–0.7)
Eosinophils Relative: 2.1 % (ref 0.0–5.0)
HCT: 29.8 % — ABNORMAL LOW (ref 36.0–46.0)
Hemoglobin: 10 g/dL — ABNORMAL LOW (ref 12.0–15.0)
Lymphocytes Relative: 26 % (ref 12.0–46.0)
Lymphs Abs: 1.1 K/uL (ref 0.7–4.0)
MCHC: 33.7 g/dL (ref 30.0–36.0)
MCV: 99.6 fl (ref 78.0–100.0)
Monocytes Absolute: 0.5 K/uL (ref 0.1–1.0)
Monocytes Relative: 12.5 % — ABNORMAL HIGH (ref 3.0–12.0)
Neutro Abs: 2.5 K/uL (ref 1.4–7.7)
Neutrophils Relative %: 58.6 % (ref 43.0–77.0)
Platelets: 348 K/uL (ref 150.0–400.0)
RBC: 2.99 Mil/uL — ABNORMAL LOW (ref 3.87–5.11)
RDW: 14.4 % (ref 11.5–15.5)
WBC: 4.2 K/uL (ref 4.0–10.5)

## 2018-11-29 LAB — C-REACTIVE PROTEIN: CRP: <1 mg/dL (ref 0.5–20.0)

## 2018-11-30 ENCOUNTER — Other Ambulatory Visit (INDEPENDENT_AMBULATORY_CARE_PROVIDER_SITE_OTHER): Payer: Medicare HMO

## 2018-11-30 ENCOUNTER — Other Ambulatory Visit: Payer: Self-pay | Admitting: Radiology

## 2018-11-30 DIAGNOSIS — M546 Pain in thoracic spine: Secondary | ICD-10-CM | POA: Diagnosis not present

## 2018-11-30 DIAGNOSIS — G8929 Other chronic pain: Secondary | ICD-10-CM | POA: Diagnosis not present

## 2018-11-30 DIAGNOSIS — M899 Disorder of bone, unspecified: Secondary | ICD-10-CM | POA: Diagnosis not present

## 2018-11-30 NOTE — Telephone Encounter (Signed)
Forwarding to Dr. [name] to advise.  

## 2018-11-30 NOTE — Telephone Encounter (Signed)
Patients husband dropped off 24 hour urine sample this morning and also brought a note for Dr. Paulla Fore from the patient.  Per Patients note:  "Dr. Paulla Fore, I'm getting no pain relief so far. Also, still no bowel movement. I feel that I need additional pain med or a change. Please advise/help.  Megan Olson [phone]"

## 2018-12-01 ENCOUNTER — Ambulatory Visit (HOSPITAL_COMMUNITY)
Admission: RE | Admit: 2018-12-01 | Discharge: 2018-12-01 | Disposition: A | Discharge status: Home / Self Care | Payer: Medicare HMO | Source: Ambulatory Visit | Attending: Sports Medicine | Admitting: Sports Medicine

## 2018-12-01 ENCOUNTER — Other Ambulatory Visit: Payer: Self-pay

## 2018-12-01 ENCOUNTER — Encounter (HOSPITAL_COMMUNITY): Payer: Self-pay

## 2018-12-01 DIAGNOSIS — D539 Nutritional anemia, unspecified: Secondary | ICD-10-CM | POA: Diagnosis not present

## 2018-12-01 DIAGNOSIS — Z8249 Family history of ischemic heart disease and other diseases of the circulatory system: Secondary | ICD-10-CM | POA: Diagnosis not present

## 2018-12-01 DIAGNOSIS — I10 Essential (primary) hypertension: Secondary | ICD-10-CM | POA: Diagnosis not present

## 2018-12-01 DIAGNOSIS — Z79899 Other long term (current) drug therapy: Secondary | ICD-10-CM | POA: Diagnosis not present

## 2018-12-01 DIAGNOSIS — Z888 Allergy status to other drugs, medicaments and biological substances status: Secondary | ICD-10-CM | POA: Insufficient documentation

## 2018-12-01 DIAGNOSIS — Z880 Allergy status to penicillin: Secondary | ICD-10-CM | POA: Diagnosis not present

## 2018-12-01 DIAGNOSIS — Z882 Allergy status to sulfonamides status: Secondary | ICD-10-CM | POA: Diagnosis not present

## 2018-12-01 DIAGNOSIS — H409 Unspecified glaucoma: Secondary | ICD-10-CM | POA: Insufficient documentation

## 2018-12-01 DIAGNOSIS — Z801 Family history of malignant neoplasm of trachea, bronchus and lung: Secondary | ICD-10-CM | POA: Insufficient documentation

## 2018-12-01 DIAGNOSIS — G8929 Other chronic pain: Secondary | ICD-10-CM | POA: Diagnosis not present

## 2018-12-01 DIAGNOSIS — Z87891 Personal history of nicotine dependence: Secondary | ICD-10-CM | POA: Diagnosis not present

## 2018-12-01 DIAGNOSIS — Z8261 Family history of arthritis: Secondary | ICD-10-CM | POA: Diagnosis not present

## 2018-12-01 DIAGNOSIS — C9000 Multiple myeloma not having achieved remission: Secondary | ICD-10-CM | POA: Insufficient documentation

## 2018-12-01 DIAGNOSIS — D492 Neoplasm of unspecified behavior of bone, soft tissue, and skin: Secondary | ICD-10-CM | POA: Insufficient documentation

## 2018-12-01 DIAGNOSIS — M899 Disorder of bone, unspecified: Secondary | ICD-10-CM | POA: Diagnosis present

## 2018-12-01 DIAGNOSIS — M546 Pain in thoracic spine: Secondary | ICD-10-CM | POA: Insufficient documentation

## 2018-12-01 DIAGNOSIS — D759 Disease of blood and blood-forming organs, unspecified: Secondary | ICD-10-CM | POA: Diagnosis not present

## 2018-12-01 LAB — CBC WITH DIFFERENTIAL/PLATELET
Abs Immature Granulocytes: 0.02 10*3/uL (ref 0.00–0.07)
Basophils Absolute: 0 10*3/uL (ref 0.0–0.1)
Basophils Relative: 1 %
Eosinophils Absolute: 0 10*3/uL (ref 0.0–0.5)
Eosinophils Relative: 1 %
HCT: 33.5 % — ABNORMAL LOW (ref 36.0–46.0)
HEMOGLOBIN: 10.4 g/dL — AB (ref 12.0–15.0)
IMMATURE GRANULOCYTES: 0 %
Lymphocytes Relative: 22 %
Lymphs Abs: 1.1 10*3/uL (ref 0.7–4.0)
MCH: 32.8 pg (ref 26.0–34.0)
MCHC: 31 g/dL (ref 30.0–36.0)
MCV: 105.7 fL — ABNORMAL HIGH (ref 80.0–100.0)
Monocytes Absolute: 0.5 10*3/uL (ref 0.1–1.0)
Monocytes Relative: 11 %
NEUTROS PCT: 65 %
Neutro Abs: 3.1 10*3/uL (ref 1.7–7.7)
Platelets: 294 10*3/uL (ref 150–400)
RBC: 3.17 MIL/uL — ABNORMAL LOW (ref 3.87–5.11)
RDW: 14.6 % (ref 11.5–15.5)
WBC: 4.8 10*3/uL (ref 4.0–10.5)
nRBC: 0 % (ref 0.0–0.2)

## 2018-12-01 LAB — PROTEIN ELECTROPHORESIS, SERUM
ABNORMAL PROTEIN BAND1: 4.6 g/dL — AB
Albumin ELP: 3.3 g/dL — ABNORMAL LOW (ref 3.8–4.8)
Alpha 1: 0.4 g/dL — ABNORMAL HIGH (ref 0.2–0.3)
Alpha 2: 0.8 g/dL (ref 0.5–0.9)
Beta 2: 4.7 g/dL — ABNORMAL HIGH (ref 0.2–0.5)
Beta Globulin: 0.5 g/dL (ref 0.4–0.6)
Gamma Globulin: 0.2 g/dL — ABNORMAL LOW (ref 0.8–1.7)
Total Protein: 10 g/dL — ABNORMAL HIGH (ref 6.1–8.1)

## 2018-12-01 LAB — PROTIME-INR
INR: 1.1 (ref 0.8–1.2)
Prothrombin Time: 13.6 seconds (ref 11.4–15.2)

## 2018-12-01 LAB — TIQ-NTM

## 2018-12-01 LAB — LACTATE DEHYDROGENASE: LDH: 117 U/L — ABNORMAL LOW (ref 120–250)

## 2018-12-01 MED ORDER — HYDROCORTISONE ACETATE 25 MG RE SUPP: 25.0000 mg | Freq: Two times a day (BID) | RECTAL | 0 refills | Status: DC

## 2018-12-01 MED ORDER — FENTANYL CITRATE (PF) 100 MCG/2ML IJ SOLN
INTRAMUSCULAR | Status: AC | PRN
  Administered 2018-12-01 (×2): 50 ug via INTRAVENOUS

## 2018-12-01 MED ORDER — FLUMAZENIL 0.5 MG/5ML IV SOLN
INTRAVENOUS | Status: AC
  Filled 2018-12-01: qty 5

## 2018-12-01 MED ORDER — MIDAZOLAM HCL 2 MG/2ML IJ SOLN
INTRAMUSCULAR | Status: AC
  Filled 2018-12-01: qty 4

## 2018-12-01 MED ORDER — LIDOCAINE HCL (PF) 1 % IJ SOLN
INTRAMUSCULAR | Status: AC | PRN
  Administered 2018-12-01: 10 mL

## 2018-12-01 MED ORDER — SODIUM CHLORIDE 0.9 % IV SOLN
INTRAVENOUS | Status: DC
  Administered 2018-12-01: 09:00:00 via INTRAVENOUS

## 2018-12-01 MED ORDER — MIDAZOLAM HCL 2 MG/2ML IJ SOLN
INTRAMUSCULAR | Status: AC | PRN
  Administered 2018-12-01: 1 mg via INTRAVENOUS

## 2018-12-01 MED ORDER — OXYCODONE-ACETAMINOPHEN 5-325 MG PO TABS: 1.0000 | ORAL_TABLET | Freq: Four times a day (QID) | ORAL | 0 refills | Status: DC | PRN

## 2018-12-01 MED ORDER — FENTANYL CITRATE (PF) 100 MCG/2ML IJ SOLN
INTRAMUSCULAR | Status: AC
  Filled 2018-12-01: qty 2

## 2018-12-01 MED ORDER — NALOXONE HCL 0.4 MG/ML IJ SOLN
INTRAMUSCULAR | Status: AC
  Filled 2018-12-01: qty 1

## 2018-12-01 NOTE — H&P (Signed)
Chief Complaint: Patient was seen in consultation today for bone lesion.  Referring Physician(s): [name],Michael D  Supervising Physician: Jacqulynn Cadet  Patient Status: [name] - Out-pt  History of Present Illness: Megan Olson is a 78 y.o. female with a past medical history of hypertension, glaucoma, and osteopenia. She has had progressively worsening midthoracic back pain for months. She was referred to Dr. Paulla Olson by her PCP for further management. She underwent a MRI thoracic spine for evaluation which was suspicious for metastatic disease or multiple myeloma.  MRI thoracic spine 11/27/2018: 1. Multifocal marrow signal abnormality consistent with metastatic disease or multiple myeloma. The patient has multiple compression fractures most consistent with pathologic injuries. Extensive marrow signal abnormality makes determining age of the fractures difficult but edema is most intense in T3. 2. 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  IR requested by Dr. Paulla Olson for possible image-guided bone marrow biopsy/aspiration. Patient awake and alert laying in bed. Complains of midthoracic back pain rated 7/10 at this time. Denies fever, chills, chest pain, dyspnea, abdominal pain, dizziness, or headache.   Past Medical History:  Diagnosis Date  . Glaucoma   . HYPERTENSION 03/11/2007  . OSTEOPENIA 03/11/2007    Past Surgical History:  Procedure Laterality Date  . BREAST EXCISIONAL BIOPSY Right 2000  . BREAST LUMPECTOMY  1990   benign  . DILATION AND CURETTAGE OF UTERUS     bleeding at menopause. No uterine cancer  . TONSILLECTOMY     age 9    Allergies: Ace inhibitors; Diamox [acetazolamide]; Penicillins; and Sulfamethoxazole  Medications: Prior to Admission medications   Medication Sig Start Date End Date Taking? Authorizing Provider  ALPHAGAN P 0.1 % SOLN Apply 1 drop topically daily. Apply  2 drops in right eye 02/02/17   [provider]  amLODipine (NORVASC) 5 MG tablet Take 1 tablet (5 mg total) by mouth daily. 04/04/18   [name], MD  bimatoprost (LUMIGAN) 0.03 % ophthalmic solution Place 1 drop into both eyes at bedtime.    [provider]  Cholecalciferol (VITAMIN D3) 1000 UNITS CAPS Take by mouth daily.      [provider]  co-enzyme Q-10 50 MG capsule Take 50 mg by mouth daily.      [provider]  dorzolamide-timolol (COSOPT) 22.3-6.8 MG/ML ophthalmic solution Place 1 drop into both eyes 2 (two) times daily. 03/01/17   [provider]  HYDROcodone-acetaminophen (NORCO/VICODIN) 5-325 MG tablet Take 1 tablet by mouth every 4 (four) hours as needed for moderate pain. 11/28/18   Gerda Diss, DO  Magnesium Oxide 500 MG TABS Take 1 tablet by mouth daily.      [provider]  Multiple Vitamins-Minerals (MULTIVITAMIN WITH MINERALS) tablet Take 1 tablet by mouth daily.      [provider]  omega-3 acid ethyl esters (LOVAZA) 1 g capsule Take 1 g by mouth daily.    [provider]  Resveratrol 50 MG CAPS Take 1 capsule by mouth daily.    [provider]  senna-docusate (SENOKOT-S) 8.6-50 MG tablet Take 1 tablet by mouth 2 (two) times daily as needed for mild constipation. 11/28/18   Gerda Diss, DO  Turmeric Curcumin 500 MG CAPS Take 500 mg by mouth daily.    [provider]     Family History  Problem Relation Age of Onset  . Heart disease Mother  CHF mother died 19  . Arthritis Mother   . Glaucoma Mother        sister as well  . Alcohol abuse Father   . Suicidality Father   . Cancer Sister        lung cancer, smoker  . Heart disease Sister        aortic valve replacement  . Hyperlipidemia Brother   . Hypertension Brother   . COPD Brother   . Arthritis Sister   . Hypertension Sister   . Glaucoma Sister   . Hashimoto's thyroiditis Sister   . Hypertension Son     . Stroke Maternal Grandmother   . Colon cancer Neg Hx   . Colon polyps Neg Hx     Social History   Socioeconomic History  . Marital status: Married    Spouse name: Not on file  . Number of children: Not on file  . Years of education: Not on file  . Highest education level: Not on file  Occupational History  . Not on file  Social Needs  . Financial resource strain: Not on file  . Food insecurity:    Worry: Not on file    Inability: Not on file  . Transportation needs:    Medical: Not on file    Non-medical: Not on file  Tobacco Use  . Smoking status: Former Smoker    Packs/day: 0.50    Years: 7.00    Pack years: 3.50    Types: Cigarettes    Last attempt to quit: 01/04/1961    Years since quitting: 57.9  . Smokeless tobacco: Never Used  Substance and Sexual Activity  . Alcohol use: Yes    Alcohol/week: 1.0 standard drinks    Types: 1 Standard drinks or equivalent per week  . Drug use: No  . Sexual activity: Not Currently  Lifestyle  . Physical activity:    Days per week: Not on file    Minutes per session: Not on file  . Stress: Not on file  Relationships  . Social connections:    Talks on phone: Not on file    Gets together: Not on file    Attends religious service: Not on file    Active member of club or organization: Not on file    Attends meetings of clubs or organizations: Not on file    Relationship status: Not on file  Other Topics Concern  . Not on file  Social History Narrative   Married. Lives with husband (patient of Dr. Yong Channel). 1 son. No grandkids. 1 granddog.       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  All other systems are negative.  Review of Systems  Constitutional: Negative for chills and fever.  Respiratory:  Negative for shortness of breath and wheezing.   Cardiovascular: Negative for chest pain and palpitations.  Gastrointestinal: Negative for abdominal pain.  Musculoskeletal: Positive for back pain.  Neurological: Negative for dizziness and headaches.  Psychiatric/Behavioral: Negative for behavioral problems and confusion.    Vital Signs: BP (!) 169/93   Pulse 86   Temp 98 F (36.7 C) (Oral)   Resp 16   SpO2 99%   Physical Exam Vitals signs and nursing note reviewed.  Constitutional:  General: She is not in acute distress.    Appearance: Normal appearance.  Cardiovascular:     Rate and Rhythm: Normal rate and regular rhythm.     Heart sounds: Normal heart sounds. No murmur.  Pulmonary:     Effort: Pulmonary effort is normal. No respiratory distress.     Breath sounds: Normal breath sounds. No wheezing.  Skin:    General: Skin is warm and dry.  Neurological:     Mental Status: She is alert and oriented to person, place, and time.  Psychiatric:        Mood and Affect: Mood normal.        Behavior: Behavior normal.        Thought Content: Thought content normal.        Judgment: Judgment normal.      MD Evaluation Airway: WNL Heart: WNL Abdomen: WNL Chest/ Lungs: WNL ASA  Classification: 2 Mallampati/Airway Score: One   Imaging: Dg Cervical Spine 2 Or 3 Views  Result Date: 11/03/2018 CLINICAL DATA:  Back pain EXAM: CERVICAL SPINE - 2-3 VIEW COMPARISON:  None. FINDINGS: Degenerative disc disease from C4-5 through C6-7 with disc space narrowing and spurring. Diffuse degenerative facet disease. Normal alignment. No fracture. Prevertebral soft tissues are normal. IMPRESSION: Moderate degenerative disc and facet disease. No acute bony abnormality. Electronically Signed   By: Rolm Baptise M.D.   On: 11/03/2018 15:52   Dg Thoracic Spine W/swimmers  Result Date: 11/03/2018 CLINICAL DATA:  Back pain EXAM: THORACIC SPINE - 3 VIEWS COMPARISON:  Chest x-ray 11/19/2016  FINDINGS: Mild rightward scoliosis centered in the midthoracic spine. Mild compression deformity at T11 which is new since 2018. Diffuse osteopenia. IMPRESSION: Osteopenia.  Mild compression fracture at T11, new since 2018. Electronically Signed   By: Rolm Baptise M.D.   On: 11/03/2018 15:50   Mr Thoracic Spine Wo Contrast  Result Date: 11/27/2018 CLINICAL DATA:  Severe thoracic spine pain for 3-4 months. No known injury. Age indeterminate compression fracture on recent plain films of the thoracic spine. EXAM: MRI THORACIC SPINE WITHOUT CONTRAST TECHNIQUE: Multiplanar, multisequence MR imaging of the thoracic spine was performed. No intravenous contrast was administered. COMPARISON:  PA and lateral chest 11/20/2011. Plain films thoracic spine 11/03/2018. FINDINGS: Alignment:  No listhesis.  Mild exaggeration of kyphosis noted. Vertebrae: Extensive marrow signal abnormality is seen throughout the thoracic spine. Marrow throughout the T3 and T5 vertebral bodies is almost completely replaced with abnormal signal and extends into the pedicles, better seen at T3. Marrow signal abnormality is also identified on scout imaging in the cervical spine, best seen in T3. The patient has compression fractures of the inferior endplate of T1, biconcave at T3, inferior endplate of T5, a biconcave at T9, superior endplate of T51, superior endplate of V61 and superior endplate of L2. Vertebral body height loss appears worst centrally at T3 where it is approximately 70%. Cord:  Normal signal throughout. Paraspinal and other soft tissues: Negative. Disc levels: C7-T1: Shallow central protrusion without stenosis. T2-3: Minimal bulge without stenosis. T2-3: There is epidural tumor centrally and eccentric toward the left posterior to T3 which extends into the left foramen. Epidural tumor effaces the ventral thecal sac without causing central canal stenosis. It could impact the exiting left T3 root. T3-4, T4-5: Negative. T5-6: Shallow  central protrusion without stenosis. T7-8: Central disc protrusion contacts the cord but the central canal appears open T8-9: Negative. T9-10: Negative. T10-11: Minimal bony retropulsion off the superior endplate of Y07 without stenosis. T11-12:  Minimal bony retropulsion off the superior endplate of P10 without stenosis. T12-L1: Negative. L1-2: Negative. IMPRESSION: Multifocal marrow signal abnormality consistent with metastatic disease or multiple myeloma. The patient has multiple compression fractures most consistent with pathologic injuries. Extensive marrow signal abnormality makes determining age of the fractures difficult but edema is most intense in T3. 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 These results will be called to the ordering clinician or representative by the Radiologist Assistant, and communication documented in the PACS or zVision Dashboard. Electronically Signed   By: Inge Rise M.D.   On: 11/27/2018 16:07    Labs:  CBC: Recent Labs    04/04/18 0849 11/28/18 1548 12/01/18 0757  WBC 3.2* 4.2 4.8  HGB 12.3 10.0* 10.4*  HCT 36.8 29.8* 33.5*  PLT 210.0 348.0 294    COAGS: Recent Labs    12/01/18 0757  INR 1.1    BMP: Recent Labs    04/04/18 0849 11/28/18 1548  NA 141 137  K 4.4 4.2  CL 104 102  CO2 30 26  GLUCOSE 112* 105*  BUN 13 19  CALCIUM 9.2 8.9  CREATININE 0.68 0.68    LIVER FUNCTION TESTS: Recent Labs    04/04/18 0849 11/28/18 1548  BILITOT 0.5 0.3  AST 12 10  ALT 12 10  ALKPHOS 49 63  PROT 7.5 9.9*  10.0*  10.2*  ALBUMIN 4.2 3.1*     Assessment and Plan:  Bone lesion. Plan for image-guided bone marrow biopsy/aspiration today with Dr. Laurence Ferrari. Patient is NPO. Afebrile and WBCs WNL. She does not take blood thinners. INR 1.1 seconds today.  Risks and benefits discussed with the patient including, but not limited to bleeding,  infection, damage to adjacent structures or low yield requiring additional tests. All of the patient's questions were answered, patient is agreeable to proceed. Consent signed and in chart.   Thank you for this interesting consult.  I greatly enjoyed meeting FYNLEE ROWLANDS and look forward to participating in their care.  A copy of this report was sent to the requesting provider on this date.  Electronically Signed: Earley Abide, PA-C 12/01/2018, 8:38 AM   I spent a total of 30 Minutes in face to face in clinical consultation, greater than 50% of which was counseling/coordinating care for bone lesion.

## 2018-12-01 NOTE — Addendum Note (Signed)
Addended by: Jasper Loser on: 12/01/2018 10:25 AM   Modules accepted: Orders

## 2018-12-01 NOTE — Telephone Encounter (Signed)
Rx for Percocet and suppository provided.

## 2018-12-01 NOTE — Discharge Instructions (Signed)
Moderate Conscious Sedation, Adult, Care After These instructions provide you with information about caring for yourself after your procedure. Your health care provider may also give you more specific instructions. Your treatment has been planned according to current medical practices, but problems sometimes occur. Call your health care provider if you have any problems or questions after your procedure. What can I expect after the procedure? After your procedure, it is common:  To feel sleepy for several hours.  To feel clumsy and have poor balance for several hours.  To have poor judgment for several hours.  To vomit if you eat too soon. Follow these instructions at home: For at least 24 hours after the procedure:   Do not: ? Participate in activities where you could fall or become injured. ? Drive. ? Use heavy machinery. ? Drink alcohol. ? Take sleeping pills or medicines that cause drowsiness. ? Make important decisions or sign legal documents. ? Take care of children on your own.  Rest. Eating and drinking  Follow the diet recommended by your health care provider.  If you vomit: ? Drink water, juice, or soup when you can drink without vomiting. ? Make sure you have little or no nausea before eating solid foods. General instructions  Have a responsible adult stay with you until you are awake and alert.  Take over-the-counter and prescription medicines only as told by your health care provider.  If you smoke, do not smoke without supervision.  Keep all follow-up visits as told by your health care provider. This is important. Contact a health care provider if:  You keep feeling nauseous or you keep vomiting.  You feel light-headed.  You develop a rash.  You have a fever. Get help right away if:  You have trouble breathing. This information is not intended to replace advice given to you by your health care provider. Make sure you discuss any questions you have  with your health care provider. Document Released: 07/12/2013 Document Revised: 02/24/2016 Document Reviewed: 01/11/2016 Elsevier Interactive Patient Education  2019 [place].   Bone Marrow Aspiration and Bone Marrow Biopsy, Adult, Care After This sheet gives you information about how to care for yourself after your procedure. Your health care provider may also give you more specific instructions. If you have problems or questions, contact your health care provider. What can I expect after the procedure? After the procedure, it is common to have:  Mild pain and tenderness.  Swelling.  Bruising. Follow these instructions at home: Puncture site care  Follow instructions from your health care provider about how to take care of the puncture site. Make sure you: ? Wash your hands with soap and water before you change your bandage (dressing). If soap and water are not available, use hand sanitizer. ? Change your dressing as told by your health care provider. May remove bandage in 24 hours and bathe or shower.  Keep site clean and dry.  Replace with bandaid as necessary.  Check your puncture siteevery day for signs of infection. Check for: ? More redness, swelling, or pain. ? More fluid or blood. ? Warmth. ? Pus or a bad smell. General instructions  Take over-the-counter and prescription medicines only as told by your health care provider.  Do not take baths, swim, or use a hot tub until your health care provider approves. Ask if you can take a shower or have a sponge bath.  Return to your normal activities as told by your health care provider. Ask your  health care provider what activities are safe for you.  Do not drive for 24 hours if you were given a medicine to help you relax (sedative) during your procedure.  Keep all follow-up visits as told by your health care provider. This is important. Contact a health care provider if:  Your pain is not controlled with medicine. Get  help right away if:  You have a fever.  You have more redness, swelling, or pain around the puncture site.  You have more fluid or blood coming from the puncture site.  Your puncture site feels warm to the touch.  You have pus or a bad smell coming from the puncture site. These symptoms may represent a serious problem that is an emergency. Do not wait to see if the symptoms will go away. Get medical help right away. Call your local emergency services (911 in the [place] Do not drive yourself to the hospital. Summary  After the procedure, it is common to have mild pain, tenderness, swelling, and bruising.  Follow instructions from your health care provider about how to take care of the puncture site.  Get help right away if you have any symptoms of infection or if you have more blood or fluid coming from the puncture site. This information is not intended to replace advice given to you by your health care provider. Make sure you discuss any questions you have with your health care provider. Document Released: 04/10/2005 Document Revised: 01/04/2018 Document Reviewed: 03/04/2016 Elsevier Interactive Patient Education  2019 [employer].

## 2018-12-01 NOTE — Procedures (Signed)
Interventional Radiology Procedure Note  Procedure: CT guided aspirate and core biopsy of right iliac bone Complications: None Recommendations: - Bedrest supine x 1 hrs - Hydrocodone PRN  Pain - Follow biopsy results  Signed,  Heath K. McCullough, MD   

## 2018-12-01 NOTE — Addendum Note (Signed)
Addended by: Teresa Coombs D on: 12/01/2018 11:16 AM   Modules accepted: Orders

## 2018-12-02 LAB — PE AND FLC, SERUM
A/G Ratio: 0.5 — ABNORMAL LOW (ref 0.7–1.7)
Albumin ELP: 3.5 g/dL (ref 2.9–4.4)
Alpha 1: 0.4 g/dL (ref 0.0–0.4)
Alpha 2: 1 g/dL (ref 0.4–1.0)
Beta: 4.8 g/dL — ABNORMAL HIGH (ref 0.7–1.3)
Gamma Globulin: 0.1 g/dL — ABNORMAL LOW (ref 0.4–1.8)
Globulin, Total: 6.4 g/dL — ABNORMAL HIGH (ref 2.2–3.9)
Ig Kappa Free Light Chain: 5.8 mg/L (ref 3.3–19.4)
Ig Lambda Free Light Chain: 1750.9 mg/L — ABNORMAL HIGH (ref 5.7–26.3)
Kappa/Lambda FluidC Ratio: 0 — ABNORMAL LOW (ref 0.26–1.65)
M-Spike, %: 4.1 g/dL — ABNORMAL HIGH
TOTAL PROTEIN: 9.9 g/dL — AB (ref 6.0–8.5)

## 2018-12-02 LAB — UPEP/TP, 24-HR URINE
ALPHA 2 UR: 80.4 %
Albumin, U: 5.8 %
Alpha 1, Urine: 0.9 %
BETA UR: 11.4 %
Gamma Globulin, Urine: 1.4 %
M-spike, %: 75.1 % — ABNORMAL HIGH
Protein, Ur: 101.3 mg/dL

## 2018-12-02 LAB — IMMUNOGLOBULINS A/E/G/M, SERUM
IgA/Immunoglobulin A, Serum: 24 mg/dL — ABNORMAL LOW (ref 64–422)
IgE (Immunoglobulin E), Serum: 6 IU/mL (ref 6–495)
IgG (Immunoglobin G), Serum: 6181 mg/dL — ABNORMAL HIGH (ref 700–1600)
IgM (Immunoglobulin M), Srm: 16 mg/dL — ABNORMAL LOW (ref 26–217)

## 2018-12-02 LAB — BETA 2 MICROGLOBULIN, SERUM: Beta-2: 2.6 mg/L — ABNORMAL HIGH (ref 0.6–2.4)

## 2018-12-02 MED ORDER — GLYCERIN (ADULT) 2 G RE SUPP: 1.0000 | RECTAL | 0 refills | Status: DC | PRN

## 2018-12-02 NOTE — Addendum Note (Signed)
Addended by: Teresa Coombs D on: 12/02/2018 09:42 AM   Modules accepted: Orders

## 2018-12-03 ENCOUNTER — Encounter: Payer: Self-pay | Admitting: Sports Medicine

## 2018-12-05 NOTE — Progress Notes (Signed)
Seems like Multiple Myeloma however some possible inconsistencies on Bone Marrow Biopsy. Should have referral to Oncology pending.

## 2018-12-06 ENCOUNTER — Encounter: Payer: Self-pay | Admitting: Hematology

## 2018-12-06 ENCOUNTER — Telehealth: Payer: Self-pay | Admitting: Hematology

## 2018-12-06 NOTE — Telephone Encounter (Signed)
A new patient appt has been scheduled for the pt to see Dr. Irene Limbo on 3/9 at 11am. Megan Olson is aware to arrive 30 minutes early. Letter mailed.

## 2018-12-08 ENCOUNTER — Telehealth: Payer: Self-pay | Admitting: Hematology

## 2018-12-08 ENCOUNTER — Encounter: Payer: Self-pay | Admitting: Sports Medicine

## 2018-12-08 MED ORDER — OXYCODONE-ACETAMINOPHEN 5-325 MG PO TABS: 1.0000 | ORAL_TABLET | Freq: Four times a day (QID) | ORAL | 0 refills | Status: DC | PRN

## 2018-12-08 NOTE — Telephone Encounter (Signed)
Tried calling Megan Olson on her home phone number but the line was busy and the voicemail for her cell phone was busy.

## 2018-12-12 ENCOUNTER — Other Ambulatory Visit: Payer: Self-pay | Admitting: Hematology

## 2018-12-12 ENCOUNTER — Inpatient Hospital Stay: Payer: Medicare HMO | Attending: Hematology | Admitting: Hematology

## 2018-12-12 ENCOUNTER — Telehealth: Payer: Self-pay | Admitting: Hematology

## 2018-12-12 VITALS — BP 161/89 | HR 80 | Temp 97.9°F | Resp 18 | Ht 63.75 in | Wt 108.4 lb

## 2018-12-12 DIAGNOSIS — S22030A Wedge compression fracture of third thoracic vertebra, initial encounter for closed fracture: Secondary | ICD-10-CM

## 2018-12-12 DIAGNOSIS — M4854XA Collapsed vertebra, not elsewhere classified, thoracic region, initial encounter for fracture: Secondary | ICD-10-CM

## 2018-12-12 DIAGNOSIS — M7989 Other specified soft tissue disorders: Secondary | ICD-10-CM | POA: Insufficient documentation

## 2018-12-12 DIAGNOSIS — Y92531 Health care provider office as the place of occurrence of the external cause: Secondary | ICD-10-CM

## 2018-12-12 DIAGNOSIS — H409 Unspecified glaucoma: Secondary | ICD-10-CM

## 2018-12-12 DIAGNOSIS — D72820 Lymphocytosis (symptomatic): Secondary | ICD-10-CM

## 2018-12-12 DIAGNOSIS — K5903 Drug induced constipation: Secondary | ICD-10-CM | POA: Diagnosis not present

## 2018-12-12 DIAGNOSIS — T402X5A Adverse effect of other opioids, initial encounter: Secondary | ICD-10-CM

## 2018-12-12 DIAGNOSIS — Z801 Family history of malignant neoplasm of trachea, bronchus and lung: Secondary | ICD-10-CM | POA: Insufficient documentation

## 2018-12-12 DIAGNOSIS — I10 Essential (primary) hypertension: Secondary | ICD-10-CM

## 2018-12-12 DIAGNOSIS — Z7189 Other specified counseling: Secondary | ICD-10-CM | POA: Insufficient documentation

## 2018-12-12 DIAGNOSIS — Z79899 Other long term (current) drug therapy: Secondary | ICD-10-CM | POA: Insufficient documentation

## 2018-12-12 DIAGNOSIS — Q438 Other specified congenital malformations of intestine: Secondary | ICD-10-CM | POA: Diagnosis not present

## 2018-12-12 DIAGNOSIS — C9000 Multiple myeloma not having achieved remission: Secondary | ICD-10-CM

## 2018-12-12 DIAGNOSIS — Z803 Family history of malignant neoplasm of breast: Secondary | ICD-10-CM

## 2018-12-12 DIAGNOSIS — R609 Edema, unspecified: Secondary | ICD-10-CM | POA: Diagnosis not present

## 2018-12-12 DIAGNOSIS — K649 Unspecified hemorrhoids: Secondary | ICD-10-CM | POA: Diagnosis not present

## 2018-12-12 DIAGNOSIS — K5900 Constipation, unspecified: Secondary | ICD-10-CM | POA: Insufficient documentation

## 2018-12-12 DIAGNOSIS — Z87891 Personal history of nicotine dependence: Secondary | ICD-10-CM | POA: Insufficient documentation

## 2018-12-12 DIAGNOSIS — G893 Neoplasm related pain (acute) (chronic): Secondary | ICD-10-CM | POA: Insufficient documentation

## 2018-12-12 DIAGNOSIS — M8580 Other specified disorders of bone density and structure, unspecified site: Secondary | ICD-10-CM | POA: Diagnosis not present

## 2018-12-12 DIAGNOSIS — Z7982 Long term (current) use of aspirin: Secondary | ICD-10-CM | POA: Insufficient documentation

## 2018-12-12 DIAGNOSIS — Z5111 Encounter for antineoplastic chemotherapy: Secondary | ICD-10-CM | POA: Insufficient documentation

## 2018-12-12 DIAGNOSIS — S22000A Wedge compression fracture of unspecified thoracic vertebra, initial encounter for closed fracture: Secondary | ICD-10-CM

## 2018-12-12 MED ORDER — ACYCLOVIR 400 MG PO TABS: 400.0000 mg | ORAL_TABLET | Freq: Two times a day (BID) | ORAL | 3 refills | Status: DC

## 2018-12-12 MED ORDER — LENALIDOMIDE 15 MG PO CAPS: ORAL_CAPSULE | ORAL | 5 refills | Status: DC

## 2018-12-12 MED ORDER — SENNOSIDES-DOCUSATE SODIUM 8.6-50 MG PO TABS: 2.0000 | ORAL_TABLET | Freq: Every day | ORAL | 2 refills | Status: DC

## 2018-12-12 MED ORDER — ONDANSETRON HCL 8 MG PO TABS: 8.0000 mg | ORAL_TABLET | Freq: Two times a day (BID) | ORAL | 1 refills | Status: DC | PRN

## 2018-12-12 MED ORDER — PROCHLORPERAZINE MALEATE 10 MG PO TABS: 10.0000 mg | ORAL_TABLET | Freq: Four times a day (QID) | ORAL | 1 refills | Status: DC | PRN

## 2018-12-12 MED ORDER — POLYETHYLENE GLYCOL 3350 17 G PO PACK: 17.0000 g | PACK | Freq: Every day | ORAL | 1 refills | Status: AC

## 2018-12-12 NOTE — Telephone Encounter (Signed)
Scheduled appt per 3/9 los.  Printed calendar and avs.  Gave patient the number to central radiology to schedule PET scan.  Will contact [facility] about IR consult for possible ablation and vertebroplasty.

## 2018-12-12 NOTE — Patient Instructions (Signed)
Thank you for choosing [facility] to provide your oncology and hematology care.  To afford each patient quality time with our providers, please arrive 30 minutes before your scheduled appointment time.  If you arrive late for your appointment, you may be asked to reschedule.  We strive to give you quality time with our providers, and arriving late affects you and other patients whose appointments are after yours.    If you are a no show for multiple scheduled visits, you may be dismissed from the clinic at the providers discretion.     Again, thank you for choosing [facility], our hope is that these requests will decrease the amount of time that you wait before being seen by our physicians.  ______________________________________________________________________   Should you have questions after your visit to the [facility], please contact our office at [phone] between the hours of 8:30 and 4:30 p.m.    Voicemails left after 4:30p.m will not be returned until the following business day.     For prescription refill requests, please have your pharmacy contact us directly.  Please also try to allow 48 hours for prescription requests.     Please contact the scheduling department for questions regarding scheduling.  For scheduling of procedures such as PET scans, CT scans, MRI, Ultrasound, etc please contact central scheduling at [phone].     Resources For Cancer Patients and Caregivers:    Oncolink.org:  A wonderful resource for patients and healthcare providers for information regarding your disease, ways to tract your treatment, what to expect, etc.      American Cancer Society:  800-227-2345  Can help patients locate various types of support and financial assistance   Cancer Care: 1-800-813-HOPE (4673) Provides financial assistance, online support groups, medication/co-pay assistance.     [place] DSS:  336-641-3447 Where to apply  for food stamps, Medicaid, and utility assistance   Medicare Rights Center: 800-333-4114 Helps people with Medicare understand their rights and benefits, navigate the Medicare system, and secure the quality healthcare they deserve   SCAT: 336-333-6589 [place] [employer]'s shared-ride transportation service for eligible riders who have a disability that prevents them from riding the fixed route bus.     For additional information on assistance programs please contact our social worker:   Abigail Elmore:  [phone]  

## 2018-12-12 NOTE — Progress Notes (Signed)
HEMATOLOGY/ONCOLOGY CONSULTATION NOTE  Date of Service: 12/12/2018  Patient Care Team: [name], MD as PCP - General (Family Medicine)  Dr. Ronney Lion as [place] Specialist  [phone]  CHIEF COMPLAINTS/PURPOSE OF CONSULTATION:  Multiple Myeloma  HISTORY OF PRESENTING ILLNESS:   Megan Olson is a wonderful 78 y.o. female who has been referred to [place] by Dr. 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 She is accompanied today by her husband. The pt reports that she is doing well overall.   The pt notes that she was doing extensive yard work in October 2019, and began to feel back pain a few days after this, which she attributed to muscle pain. She recalls taking a deep breath, and developing sudden acute pain, and presented to care with her PCP's office. She began exercises for her back pain, without relief, then began PT without relief, then was referred to Dr. Paulla Fore in sports medicine in late January 2020. She had an XR which revealed a compression fracture at T11, then subsequently had an MRI, and a bone marrow biopsy. The pt notes that her back pain "seemed to move around." She endorses pain "up the whole left side" of her back.  The pt notes that she is not able to stand up straight due to her back pain, which she feels limits her ability to take a deep breath, and endorses pain exacerbation when she does take a deep breath. The pt needs assistance from sitting to lying from her husband. She is using about 3 Percocet a day.  The pt notes worsened constipation since beginning Percocet, and notes that her most recent laxative order was not covered by her insurance. She is using prune juice and milk of magnesia. She took Senokot S BID without relief.  The pt reports that prior to her recent back pain, she had very few medical concerns. She endorses controlled BP, and has been monitored for DM but after closely  watching her diet her A1C decreased to 5.8. She has glaucoma, and has had surgery in both eyes. Right eye with stent. The pt sees Dr. Edilia Bo at [facility] for her eye care. The pt notes that her vision has been recently "pretty good." The pt notes that she has been advised to limit treatment with steroids for her glaucoma. She denies heart or lung problems. Denies previous back problems. Last DEXA scan 3 years ago, and endorses osteopenia. She notes that she took Fosamax for 3-4 years, and stopped 5-6 years ago. She takes Vitamin D, a multivitamin, and magnesium.  The pt notes that she quit smoking cigarettes when she was 27, started when she was about 20. The pt consumes alcohol rarely, but not since beginning narcotics. She previously worked in [employer].  Of note prior to the patient's visit today, pt has had an MRI Thoracic Spine completed on 11/27/18 with results revealing Multifocal marrow signal abnormality consistent with metastatic disease or multiple myeloma. The patient has multiple compression fractures most consistent with pathologic injuries. Extensive marrow signal abnormality makes determining age of the fractures difficult but edema is most intense in T3. 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  Most recent lab results (12/01/18) of CBC w/diff is as follows: all values are WNL except for RBC at 3.17, HGB at 10.4, HCT at 33.5, MCV at 105.7. 11/28/18 CMP  revealed all values WNL except for Glucose at 105, Total Protein at 10.2, Albumin at 3.1 11/30/18 24HR UPEP revealed all values WNL except for M-spike at 75.1%. 11/28/18 Bega-2 microglobulin slightly elevated at 2.6 11/28/18 SPEP revealed M spike at 4.6g 11/28/18 Immunoglobulins revealed IgG at 6181, IgA at 24, IgM at 16, and IgE at 6.  On review of systems, pt reports constipation, back pain, stable energy levels, ankle swelling, tenderness at T3,  lower back pain, and denies abdominal pains, neck pain, and any other symptoms.   On PMHx the pt reports redundant colon, single hemorrhoid, glaucoma, HTN, osteopenia, Tonsillectomy, right eye stent and multiple eye surgeries. On Social Hx the pt reports rare alcohol use, smoked cigarettes between ages 66 and 78. 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 On Family Hx the pt reports sister died from small cell lung cancer and was a lifelong smoker, brother's daughter died of breast cancer with BRCA1 and BRCA2 mutations. Cousin with female breast cancer.   MEDICAL HISTORY:  Past Medical History:  Diagnosis Date  . Glaucoma   . HYPERTENSION 03/11/2007  . OSTEOPENIA 03/11/2007    SURGICAL HISTORY: Past Surgical History:  Procedure Laterality Date  . BREAST EXCISIONAL BIOPSY Right 2000  . BREAST LUMPECTOMY  1990   benign  . DILATION AND CURETTAGE OF UTERUS     bleeding at menopause. No uterine cancer  . TONSILLECTOMY     age 61    SOCIAL HISTORY: Social History   Socioeconomic History  . Marital status: Married    Spouse name: Not on file  . Number of children: Not on file  . Years of education: Not on file  . Highest education level: Not on file  Occupational History  . Not on file  Social Needs  . Financial resource strain: Not on file  . Food insecurity:    Worry: Not on file    Inability: Not on file  . Transportation needs:    Medical: Not on file    Non-medical: Not on file  Tobacco Use  . Smoking status: Former Smoker    Packs/day: 0.50    Years: 7.00    Pack years: 3.50    Types: Cigarettes    Last attempt to quit: 01/04/1961    Years since quitting: 57.9  . Smokeless tobacco: Never Used  Substance and Sexual Activity  . Alcohol use: Yes    Alcohol/week: 1.0 standard drinks    Types: 1 Standard drinks or equivalent per week  . Drug use: No  . Sexual activity: Not Currently  Lifestyle  . Physical activity:    Days per week: Not on file    Minutes per session: Not  on file  . Stress: Not on file  Relationships  . Social connections:    Talks on phone: Not on file    Gets together: Not on file    Attends religious service: Not on file    Active member of club or organization: Not on file    Attends meetings of clubs or organizations: Not on file    Relationship status: Not on file  . Intimate partner violence:    Fear of current or ex partner: Not on file    Emotionally abused: Not on file    Physically abused: Not on file    Forced sexual activity: Not on file  Other Topics Concern  . Not on file  Social History Narrative   Married. Lives with husband (patient of Dr. Yong Channel). 1 son. No  grandkids. 1 granddog.       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 Heart disease Mother        CHF mother died 53  . Arthritis Mother   . Glaucoma Mother        sister as well  . Alcohol abuse Father   . Suicidality Father   . Cancer Sister        lung cancer, smoker  . Heart disease Sister        aortic valve replacement  . Hyperlipidemia Brother   . Hypertension Brother   . COPD Brother   . Arthritis Sister   . Hypertension Sister   . Glaucoma Sister   . Hashimoto's thyroiditis Sister   . Hypertension Son   . Stroke Maternal Grandmother   . Colon cancer Neg Hx   . Colon polyps Neg Hx     ALLERGIES:  is allergic to ace inhibitors; diamox [acetazolamide]; penicillins; and sulfamethoxazole.  MEDICATIONS:  Current Outpatient Medications  Medication Sig Dispense Refill  . ALPHAGAN P 0.1 % SOLN Apply 1 drop topically daily. Apply 2 drops in right eye    . amLODipine (NORVASC) 5 MG tablet Take 1 tablet (5 mg total) by mouth daily. 90 tablet 3  . bimatoprost (LUMIGAN) 0.03 % ophthalmic solution Place 1 drop into both eyes at bedtime.    . Cholecalciferol  (VITAMIN D3) 1000 UNITS CAPS Take by mouth daily.      Marland Kitchen co-enzyme Q-10 50 MG capsule Take 50 mg by mouth daily.      . dorzolamide-timolol (COSOPT) 22.3-6.8 MG/ML ophthalmic solution Place 1 drop into both eyes 2 (two) times daily.  11  . Magnesium Oxide 500 MG TABS Take 1 tablet by mouth daily.      . Multiple Vitamins-Minerals (MULTIVITAMIN WITH MINERALS) tablet Take 1 tablet by mouth daily.      Marland Kitchen omega-3 acid ethyl esters (LOVAZA) 1 g capsule Take 1 g by mouth daily.    Marland Kitchen oxyCODONE-acetaminophen (PERCOCET) 5-325 MG tablet Take 1 tablet by mouth every 6 (six) hours as needed for severe pain. 30 tablet 0  . polyethylene glycol (MIRALAX) packet Take 17 g by mouth daily. 30 each 1  . Resveratrol 50 MG CAPS Take 1 capsule by mouth daily.    Marland Kitchen senna-docusate (SENOKOT-S) 8.6-50 MG tablet Take 2 tablets by mouth at bedtime. 60 tablet 2  . Turmeric Curcumin 500 MG CAPS Take 500 mg by mouth daily.     No current facility-administered medications for this visit.     REVIEW OF SYSTEMS:    10 Point review of Systems was done is negative except as noted above.  PHYSICAL EXAMINATION: ECOG PERFORMANCE STATUS: 1-2  . Vitals:   12/12/18 1134  BP: (!) 161/89  Pulse: 80  Resp: 18  Temp: 97.9 F (36.6 C)  SpO2: 100%   Filed Weights   12/12/18 1134  Weight: 108 lb 6.4 oz (49.2 kg)   .Body mass index is 18.75 kg/m.  GENERAL:alert, in no acute distress and comfortable SKIN: no acute rashes, no significant lesions EYES: conjunctiva are pink and non-injected, sclera anicteric OROPHARYNX: MMM, no exudates, no oropharyngeal erythema or ulceration NECK: supple, no JVD LYMPH:  no palpable lymphadenopathy in the cervical, axillary or inguinal regions LUNGS:  clear to auscultation b/l with normal respiratory effort HEART: regular rate & rhythm ABDOMEN:  normoactive bowel sounds , non tender, not distended. Extremity: 1+ pedal edema bilaterally PSYCH: alert & oriented x 3 with fluent  speech NEURO: no focal motor/sensory deficits  LABORATORY DATA:  I have reviewed the data as listed  . CBC Latest Ref Rng & Units 12/01/2018 11/28/2018 04/04/2018  WBC 4.0 - 10.5 K/uL 4.8 4.2 3.2(L)  Hemoglobin 12.0 - 15.0 g/dL 10.4(L) 10.0(L) 12.3  Hematocrit 36.0 - 46.0 % 33.5(L) 29.8(L) 36.8  Platelets 150 - 400 K/uL 294 348.0 210.0    . CMP Latest Ref Rng & Units 11/28/2018 11/28/2018 11/28/2018  Glucose 70 - 99 mg/dL - 105(H) -  BUN 6 - 23 mg/dL - 19 -  Creatinine 0.40 - 1.20 mg/dL - 0.68 -  Sodium 135 - 145 mEq/L - 137 -  Potassium 3.5 - 5.1 mEq/L - 4.2 -  Chloride 96 - 112 mEq/L - 102 -  CO2 19 - 32 mEq/L - 26 -  Calcium 8.4 - 10.5 mg/dL - 8.9 -  Total Protein 6.0 - 8.5 g/dL 10.0(H) 9.9(H) 10.2(H)  Total Bilirubin 0.2 - 1.2 mg/dL - 0.3 -  Alkaline Phos 39 - 117 U/L - 63 -  AST 0 - 37 U/L - 10 -  ALT 0 - 35 U/L - 10 -             12/01/18 BM Bx:   12/01/18 Flow Cytometry:    RADIOGRAPHIC STUDIES: I have personally reviewed the radiological images as listed and agreed with the findings in the report. Mr Thoracic Spine Wo Contrast  Result Date: 11/27/2018 CLINICAL DATA:  Severe thoracic spine pain for 3-4 months. No known injury. Age indeterminate compression fracture on recent plain films of the thoracic spine. EXAM: MRI THORACIC SPINE WITHOUT CONTRAST TECHNIQUE: Multiplanar, multisequence MR imaging of the thoracic spine was performed. No intravenous contrast was administered. COMPARISON:  PA and lateral chest 11/20/2011. Plain films thoracic spine 11/03/2018. FINDINGS: Alignment:  No listhesis.  Mild exaggeration of kyphosis noted. Vertebrae: Extensive marrow signal abnormality is seen throughout the thoracic spine. Marrow throughout the T3 and T5 vertebral bodies is almost completely replaced with abnormal signal and extends into the pedicles, better seen at T3. Marrow signal abnormality is also identified on scout imaging in the cervical spine, best seen in T3. The  patient has compression fractures of the inferior endplate of T1, biconcave at T3, inferior endplate of T5, a biconcave at T9, superior endplate of A30, superior endplate of N40 and superior endplate of L2. Vertebral body height loss appears worst centrally at T3 where it is approximately 70%. Cord:  Normal signal throughout. Paraspinal and other soft tissues: Negative. Disc levels: C7-T1: Shallow central protrusion without stenosis. T2-3: Minimal bulge without stenosis. T2-3: There is epidural tumor centrally and eccentric toward the left posterior to T3 which extends into the left foramen. Epidural tumor effaces the ventral thecal sac without causing central canal stenosis. It could impact the exiting left T3 root. T3-4, T4-5: Negative. T5-6: Shallow central protrusion without stenosis. T7-8: Central disc protrusion contacts the cord but the central canal appears open T8-9: Negative. T9-10: Negative. T10-11: Minimal bony retropulsion off the superior endplate of H68 without stenosis. T11-12: Minimal bony retropulsion off the superior endplate of G88 without stenosis. T12-L1: Negative. L1-2: Negative. IMPRESSION: Multifocal marrow signal abnormality consistent with metastatic disease or multiple myeloma. The patient has multiple compression fractures most consistent with pathologic injuries. Extensive marrow  signal abnormality makes determining age of the fractures difficult but edema is most intense in T3. 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 These results will be called to the ordering clinician or representative by the Radiologist Assistant, and communication documented in the PACS or zVision Dashboard. Electronically Signed   By: Inge Rise M.D.   On: 11/27/2018 16:07   Ct Biopsy  Result Date: 12/01/2018 INDICATION: 78 year old female with diffuse marrow signal abnormality on MRI concerning for multiple myeloma  versus diffuse metastatic disease. She presents for bone marrow biopsy and aspiration for further evaluation. EXAM: CT GUIDED BONE MARROW ASPIRATION AND CORE BIOPSY Interventional Radiologist:  Criselda Peaches, MD MEDICATIONS: None. ANESTHESIA/SEDATION: Moderate (conscious) sedation was employed during this procedure. A total of 1 milligrams versed and 100 micrograms fentanyl were administered intravenously. The patient's level of consciousness and vital signs were monitored continuously by radiology nursing throughout the procedure under my direct supervision. Total monitored sedation time: 14 minutes FLUOROSCOPY TIME:  None COMPLICATIONS: None immediate. Estimated blood loss: <25 mL PROCEDURE: Informed written consent was obtained from the patient after a thorough discussion of the procedural risks, benefits and alternatives. All questions were addressed. Maximal Sterile Barrier Technique was utilized including caps, mask, sterile gowns, sterile gloves, sterile drape, hand hygiene and skin antiseptic. A timeout was performed prior to the initiation of the procedure. The patient was positioned prone and non-contrast localization CT was performed of the pelvis to demonstrate the iliac marrow spaces. Maximal barrier sterile technique utilized including caps, mask, sterile gowns, sterile gloves, large sterile drape, hand hygiene, and betadine prep. Under sterile conditions and local anesthesia, an 11 gauge coaxial bone biopsy needle was advanced into the right iliac marrow space. Needle position was confirmed with CT imaging. Initially, bone marrow aspiration was performed. Next, the 11 gauge outer cannula was utilized to obtain a right iliac bone marrow core biopsy. Needle was removed. Hemostasis was obtained with compression. The patient tolerated the procedure well. Samples were prepared with the cytotechnologist. IMPRESSION: CT-guided bone marrow aspiration and biopsy of the right iliac bone. Signed, Criselda Peaches, MD, [facility] Radiology Electronically Signed   By: Jacqulynn Cadet M.D.   On: 12/01/2018 10:01   Ct Bone Marrow Biopsy & Aspiration  Result Date: 12/01/2018 INDICATION: 78 year old female with diffuse marrow signal abnormality on MRI concerning for multiple myeloma versus diffuse metastatic disease. She presents for bone marrow biopsy and aspiration for further evaluation. EXAM: CT GUIDED BONE MARROW ASPIRATION AND CORE BIOPSY Interventional Radiologist:  Criselda Peaches, MD MEDICATIONS: None. ANESTHESIA/SEDATION: Moderate (conscious) sedation was employed during this procedure. A total of 1 milligrams versed and 100 micrograms fentanyl were administered intravenously. The patient's level of consciousness and vital signs were monitored continuously by radiology nursing throughout the procedure under my direct supervision. Total monitored sedation time: 14 minutes FLUOROSCOPY TIME:  None COMPLICATIONS: None immediate. Estimated blood loss: <25 mL PROCEDURE: Informed written consent was obtained from the patient after a thorough discussion of the procedural risks, benefits and alternatives. All questions were addressed. Maximal Sterile Barrier Technique was utilized including caps, mask, sterile gowns, sterile gloves, sterile drape, hand hygiene and skin antiseptic. A timeout was performed prior to the initiation of the procedure. The patient was positioned prone and non-contrast localization CT was performed of the pelvis to demonstrate the iliac marrow spaces. Maximal barrier sterile technique utilized  including caps, mask, sterile gowns, sterile gloves, large sterile drape, hand hygiene, and betadine prep. Under sterile conditions and local anesthesia, an 11 gauge coaxial bone biopsy needle was advanced into the right iliac marrow space. Needle position was confirmed with CT imaging. Initially, bone marrow aspiration was performed. Next,  the 11 gauge outer cannula was utilized to obtain a right iliac bone marrow core biopsy. Needle was removed. Hemostasis was obtained with compression. The patient tolerated the procedure well. Samples were prepared with the cytotechnologist. IMPRESSION: CT-guided bone marrow aspiration and biopsy of the right iliac bone. Signed, Criselda Peaches, MD, [facility] Radiology Electronically Signed   By: Jacqulynn Cadet M.D.   On: 12/01/2018 10:01    ASSESSMENT & PLAN:  78 y.o. female with  1. 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 11/28/18 CMP revealed Creatinine normal at 0.68 and Calcium normal at 8.9. 11/28/18 Beta 2 microglobulin at 2.59m (also a reading at 4.760mon the same day). 11/30/18 24HR UPEP revealed M spike at 75.1%. 11/28/18 SPEP revealed M spike of 4.6g. 11/28/18 Immunoglobulins revealed IgG elevated at 618158m -Discussed the 12/01/18 BM Bx which revealed hypercellular bone marrow with 70-80% CD138 immunohistochemistry (44% aspirate) lambda-restricted plasma cells as well as a kappa restricted population of B-cells -Discussed the 11/27/18 MRI Thoracic Spine which revealed Multifocal marrow signal abnormality consistent with metastatic disease or multiple myeloma. The patient has multiple compression fractures most consistent with pathologic injuries. Extensive marrow signal abnormality makes determining age of the fractures difficult but edema is most intense in T3. 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 -Discussed the CRAB criteria with the pt: Calcium normal at 8.9, Renal function normal with creatinine at 0.68, new Anemia in the last 8 months with HGB now at 10.4, signs of Bone tumors as  revealed on the recent MRI -Discussed the diagnosis, indications for treatment which the pt meets, and treatment options including a weekly steroid, Velcade, Revlimid and the associated risk factors including blood clots. Pt has never had a blood clot, and will begin 48m50mpirin. -I will speak with her glaucoma specialist, Dr. BondEdilia Bogarding the standard treatment recommendation of 40mg39mamethasone weekly -Discussed that multiple myeloma is not curable, and the intent of treatment is to control the disease -Discussed that the patient's Monoclonal B-cell lymphocytosis is a precursor to CLL, and that we will watchfully observe this over time and is not imminently concerning. She does not currently have elevated lymphocyte counts on peripheral blood draws. -Provided supplemental information -Percocet 5-325 every 4-6 hours as needed -Recommend beginning two tablets of Senna S at night time and daily Miralax, backing off if diarrhea develops -Will begin Zometa vs Xgeva, pt denies current dental concerns -Will set the pt up for our chemotherapy counseling class -Will refer the pt to IR for consideration of a vertebroplasty as the pt's back pain is currently a limiting factor -Will add Cytogenetics request to pathology -Will order whole body PET/CT for complete characterization (could be local scattering from right eye stent) -Will order labs today -Will tentatively begin C1 Velcade and Dexamethasone in one week, with intention to add Revlimid during C2 if initial treatment tolerance is displayed -Will begin prophylactic Acyclovir BID as well -Will see  the pt back in 7-10 days   PET/CT in 5 days IR consult for possible ablation and vertebroplasty Chemo-counseling for VRD in 5 days Plz schedule to start Velcade in 7-10 days with labs and MD visit   All of the patients questions were answered with apparent satisfaction. The patient knows to call the clinic with any problems, questions or  concerns.  The total time spent in the appt was 80 minutes and more than 50% was on counseling and direct patient cares.    Sullivan Lone MD MS AAHIVMS [facility] Hematology/Oncology Physician [facility]  (Office):       [phone] (Work cell):  [phone] (Fax):           [phone]  12/12/2018 1:02 PM  I, Baldwin [place], am acting as a scribe for Dr. Sullivan Lone.   .I have reviewed the above documentation for accuracy and completeness, and I agree with the above. [name] MD

## 2018-12-12 NOTE — Progress Notes (Signed)
START ON PATHWAY REGIMEN - Multiple Myeloma and Other Plasma Cell Dyscrasias     A cycle is every 21 days:     Bortezomib      Lenalidomide      Dexamethasone   **Always confirm dose/schedule in your pharmacy ordering system**  Patient Characteristics: Newly Diagnosed, Transplant Eligible, Unknown or Awaiting Test Results R-ISS Staging: III Disease Classification: Newly Diagnosed Is Patient Eligible for Transplant<= Transplant Eligible Risk Status: Awaiting Test Results Intent of Therapy: Non-Curative / Palliative Intent, Discussed with Patient

## 2018-12-13 ENCOUNTER — Telehealth: Payer: Self-pay | Admitting: *Deleted

## 2018-12-13 ENCOUNTER — Telehealth: Payer: Self-pay | Admitting: Pharmacist

## 2018-12-13 ENCOUNTER — Encounter: Payer: Self-pay | Admitting: Radiology

## 2018-12-13 ENCOUNTER — Ambulatory Visit
Admission: RE | Admit: 2018-12-13 | Discharge: 2018-12-13 | Disposition: A | Discharge status: Home / Self Care | Payer: Medicare HMO | Source: Ambulatory Visit | Attending: Hematology | Admitting: Hematology

## 2018-12-13 DIAGNOSIS — S22000A Wedge compression fracture of unspecified thoracic vertebra, initial encounter for closed fracture: Secondary | ICD-10-CM

## 2018-12-13 DIAGNOSIS — M8458XA Pathological fracture in neoplastic disease, other specified site, initial encounter for fracture: Secondary | ICD-10-CM | POA: Diagnosis not present

## 2018-12-13 DIAGNOSIS — Z7189 Other specified counseling: Secondary | ICD-10-CM

## 2018-12-13 DIAGNOSIS — C9000 Multiple myeloma not having achieved remission: Secondary | ICD-10-CM | POA: Diagnosis not present

## 2018-12-13 HISTORY — PX: IR RADIOLOGIST EVAL & MGMT: IMG5224

## 2018-12-13 NOTE — Telephone Encounter (Signed)
Oral Oncology Pharmacist Encounter  Received new prescription for Revlimid (lenolidomide) for the treatment of multiple myeloma not having achieved remission in conjunction with Velcade (bortezomib) injections and pulse dexamethasone, planned duration 4-6 cycles then reassessment.  Per MD note, plan to initiate Velcade and dexamethasone on 3/17/12m, with the addition of Revlimid to occur starting with cycle 2 (~01/10/19)  Labs from 2/24 and 2/27 assessed, OK for treatment. 2/24 SCr=0.68, round to 1.0 for age > 72, est CrCl ~ 35 mL/min (age=77, wt=49.2kg) Noted dose reduction of Revlimid by MD to 67m once daily  Velcade is planned to be administered at 1.3 mg/m2 SQ on days 1, 4, 8, and 11 of every 21 days cycle  Dexamethasone prescription has not yet been written, MD to verify dosing with patient's eye doctor due to extensive ocular history  Revlimid will be administered at 126mby mouth once daily for 14 days on, 7 days off, repeated every 21 days  Current medication list in Epic reviewed, no significant DDIs with Revlimid identified.  Prescription for Revlimid will be e-scribed to appropriate specialty pharmacy for dispensing once insurance authorization is approved. Revlimid is not available for dispensing at the [facility] it is a limited distribution medication.  Oral Oncology Clinic will continue to follow for insurance authorization, copayment issues, initial counseling and start date.  JeJohny DrillingPharmD, BCPS, BCOP  12/13/2018 2:56 PM Oral Oncology Clinic [phone]

## 2018-12-13 NOTE — Telephone Encounter (Signed)
Signed Revlimid Patient/Physician Agreement  faxed to [facility] [phone].  Fax confirmation received. [facility] #[id number] received.  Pharmacy notified of auth number for first Revlimid RX. Copy of Patient/Physician Agreement mailed to patient.

## 2018-12-13 NOTE — Consult Note (Signed)
Chief Complaint: Patient was seen in consultation today for  Chief Complaint  Patient presents with  . Consult    Consult for T3 Kyphoplasty   at the request of [name]  Referring Physician(s): [name]; Ridby, Legrand Como  History of Present Illness: Megan Olson is a 78 y.o. female with recent diagnosis of multiple myeloma.  Patient was being evaluated for thoracic back pain and found to have a T11 compression fracture on radiography.  Patient underwent a thoracic spine MRI without contrast.  Unfortunately, the thoracic spine MRI demonstrated multiple marrow signal abnormalities suggestive for metastatic disease or multiple myeloma.  Patient had a bone marrow biopsy that confirmed myeloma.  Patient has multiple compression fractures compatible with pathologic injuries.  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  Patient presents for evaluation of these pathologic compression fractures.  Patient presents in a wheelchair.  She describes chronic diffuse low-grade back pain.  She is taking Percocet 5/325 for pain control.  She is currently taking 1 pill every 6 hours but feels like this is not quite keeping up with her pain.  She has had constipation associated with the pain medication and is taking Senokot.  She describes occasional radiating pain in the ribs, left side greater than right but the radiating pain is unpredictable.  She has significant pain trying to stand up but says that she can manage the pain when she keeps her nose over her knees as she stands up.  She does not complain of any significant leg symptoms.  She does complain of abdominal distention that she attributes to gaseous distention.  No breathing or chest pain.  Past Medical History:  Diagnosis Date  . Glaucoma   . HYPERTENSION 03/11/2007  . OSTEOPENIA 03/11/2007    Past Surgical History:  Procedure Laterality Date  . BREAST EXCISIONAL BIOPSY  Right 2000  . BREAST LUMPECTOMY  1990   benign  . DILATION AND CURETTAGE OF UTERUS     bleeding at menopause. No uterine cancer  . TONSILLECTOMY     age 15    Allergies: Ace inhibitors; Diamox [acetazolamide]; Penicillins; and Sulfamethoxazole  Medications: Prior to Admission medications   Medication Sig Start Date End Date Taking? Authorizing Provider  acyclovir (ZOVIRAX) 400 MG tablet Take 1 tablet (400 mg total) by mouth 2 (two) times daily. 12/12/18   [name], MD  ALPHAGAN P 0.1 % SOLN Apply 1 drop topically daily. Apply 2 drops in right eye 02/02/17   [provider]  amLODipine (NORVASC) 5 MG tablet Take 1 tablet (5 mg total) by mouth daily. 04/04/18   [name], MD  bimatoprost (LUMIGAN) 0.03 % ophthalmic solution Place 1 drop into both eyes at bedtime.    [provider]  Cholecalciferol (VITAMIN D3) 1000 UNITS CAPS Take by mouth daily.      [provider]  co-enzyme Q-10 50 MG capsule Take 50 mg by mouth daily.      [provider]  dorzolamide-timolol (COSOPT) 22.3-6.8 MG/ML ophthalmic solution Place 1 drop into both eyes 2 (two) times daily. 03/01/17   [provider]  lenalidomide (REVLIMID) 15 MG capsule Take 1 capsule daily on days 1-14 every 21 days. 12/12/18   [name], MD  Magnesium Oxide 500 MG TABS Take 1 tablet by mouth daily.      [provider]  Multiple Vitamins-Minerals (MULTIVITAMIN WITH MINERALS) tablet Take 1 tablet by mouth daily.  [provider]  ondansetron (ZOFRAN) 8 MG tablet Take 1 tablet (8 mg total) by mouth 2 (two) times daily as needed (Nausea or vomiting). 12/12/18   [name], MD  oxyCODONE-acetaminophen (PERCOCET) 5-325 MG tablet Take 1 tablet by mouth every 6 (six) hours as needed for severe pain. 12/08/18   Gerda Diss, DO  polyethylene glycol [facility]) packet Take 17 g by mouth daily. 12/12/18   [name], MD  prochlorperazine  (COMPAZINE) 10 MG tablet Take 1 tablet (10 mg total) by mouth every 6 (six) hours as needed (Nausea or vomiting). 12/12/18   [name], MD  Resveratrol 50 MG CAPS Take 1 capsule by mouth daily.    [provider]  senna-docusate (SENOKOT-S) 8.6-50 MG tablet Take 2 tablets by mouth at bedtime. 12/12/18   [name], MD  Turmeric Curcumin 500 MG CAPS Take 500 mg by mouth daily.    [provider]     Family History  Problem Relation Age of Onset  . Heart disease Mother        CHF mother died 77  . Arthritis Mother   . Glaucoma Mother        sister as well  . Alcohol abuse Father   . Suicidality Father   . Cancer Sister        lung cancer, smoker  . Heart disease Sister        aortic valve replacement  . Hyperlipidemia Brother   . Hypertension Brother   . COPD Brother   . Arthritis Sister   . Hypertension Sister   . Glaucoma Sister   . Hashimoto's thyroiditis Sister   . Hypertension Son   . Stroke Maternal Grandmother   . Colon cancer Neg Hx   . Colon polyps Neg Hx     Social History   Socioeconomic History  . Marital status: Married    Spouse name: Not on file  . Number of children: Not on file  . Years of education: Not on file  . Highest education level: Not on file  Occupational History  . Not on file  Social Needs  . Financial resource strain: Not on file  . Food insecurity:    Worry: Not on file    Inability: Not on file  . Transportation needs:    Medical: Not on file    Non-medical: Not on file  Tobacco Use  . Smoking status: Former Smoker    Packs/day: 0.50    Years: 7.00    Pack years: 3.50    Types: Cigarettes    Last attempt to quit: 01/04/1961    Years since quitting: 57.9  . Smokeless tobacco: Never Used  Substance and Sexual Activity  . Alcohol use: Yes    Alcohol/week: 1.0 standard drinks    Types: 1 Standard drinks or equivalent per week  . Drug use: No  . Sexual activity: Not Currently  Lifestyle  .  Physical activity:    Days per week: Not on file    Minutes per session: Not on file  . Stress: Not on file  Relationships  . Social connections:    Talks on phone: Not on file    Gets together: Not on file    Attends religious service: Not on file    Active member of club or organization: Not on file    Attends meetings of clubs or organizations: Not on file    Relationship status: Not on file  Other  Topics Concern  . Not on file  Social History Narrative   Married. Lives with husband (patient of Dr. Yong Channel). 1 son. No grandkids. 1 granddog.       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   Cardiovascular: Negative for chest pain.  Gastrointestinal: Positive for abdominal distention.  Genitourinary: Negative.   Musculoskeletal: Positive for back pain.    Vital Signs: BP 139/75   Pulse 78   Temp 98.3 F (36.8 C) (Oral)   Resp 14   Ht 5' 2.5" (1.588 m)   Wt 49.2 kg   SpO2 98%   BMI 19.53 kg/m   Physical Exam Constitutional:      Comments: Patient is hunched over in a wheelchair.  No acute distress.  Cardiovascular:     Rate and Rhythm: Normal rate and regular rhythm.  Pulmonary:     Effort: Pulmonary effort is normal.     Breath sounds: Normal breath sounds.  Abdominal:     General: There is distension.     Tenderness: There is abdominal tenderness. There is no guarding.  Musculoskeletal:     Comments: Kyphosis in the upper thoracic spine.  Mild point tenderness near the thoracolumbar junction at the spinous processes.  No significant point tenderness in the upper thoracic spine.  Mild tenderness in the thoracic intercostal regions.        Imaging: Mr Thoracic Spine Wo Contrast  Result Date: 11/27/2018 CLINICAL DATA:  Severe thoracic spine pain for 3-4 months. No known injury. Age indeterminate  compression fracture on recent plain films of the thoracic spine. EXAM: MRI THORACIC SPINE WITHOUT CONTRAST TECHNIQUE: Multiplanar, multisequence MR imaging of the thoracic spine was performed. No intravenous contrast was administered. COMPARISON:  PA and lateral chest 11/20/2011. Plain films thoracic spine 11/03/2018. FINDINGS: Alignment:  No listhesis.  Mild exaggeration of kyphosis noted. Vertebrae: Extensive marrow signal abnormality is seen throughout the thoracic spine. Marrow throughout the T3 and T5 vertebral bodies is almost completely replaced with abnormal signal and extends into the pedicles, better seen at T3. Marrow signal abnormality is also identified on scout imaging in the cervical spine, best seen in T3. The patient has compression fractures of the inferior endplate of T1, biconcave at T3, inferior endplate of T5, a biconcave at T9, superior endplate of M01, superior endplate of U27 and superior endplate of L2. Vertebral body height loss appears worst centrally at T3 where it is approximately 70%. Cord:  Normal signal throughout. Paraspinal and other soft tissues: Negative. Disc levels: C7-T1: Shallow central protrusion without stenosis. T2-3: Minimal bulge without stenosis. T2-3: There is epidural tumor centrally and eccentric toward the left posterior to T3 which extends into the left foramen. Epidural tumor effaces the ventral thecal sac without causing central canal stenosis. It could impact the exiting left T3 root. T3-4, T4-5: Negative. T5-6: Shallow central protrusion without stenosis. T7-8: Central disc protrusion contacts the cord but the central canal appears open T8-9: Negative. T9-10: Negative. T10-11: Minimal bony retropulsion off the superior endplate of O53 without stenosis. T11-12: Minimal bony retropulsion off the superior endplate of G64 without stenosis. T12-L1: Negative. L1-2: Negative. IMPRESSION: Multifocal marrow signal abnormality consistent with metastatic disease or  multiple myeloma. The patient has multiple compression fractures most consistent with pathologic injuries. Extensive marrow signal abnormality makes determining age of the  fractures difficult but edema is most intense in T3. 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 These results will be called to the ordering clinician or representative by the Radiologist Assistant, and communication documented in the PACS or zVision Dashboard. Electronically Signed   By: Inge Rise M.D.   On: 11/27/2018 16:07   Ct Biopsy  Result Date: 12/01/2018 INDICATION: 78 year old female with diffuse marrow signal abnormality on MRI concerning for multiple myeloma versus diffuse metastatic disease. She presents for bone marrow biopsy and aspiration for further evaluation. EXAM: CT GUIDED BONE MARROW ASPIRATION AND CORE BIOPSY Interventional Radiologist:  Criselda Peaches, MD MEDICATIONS: None. ANESTHESIA/SEDATION: Moderate (conscious) sedation was employed during this procedure. A total of 1 milligrams versed and 100 micrograms fentanyl were administered intravenously. The patient's level of consciousness and vital signs were monitored continuously by radiology nursing throughout the procedure under my direct supervision. Total monitored sedation time: 14 minutes FLUOROSCOPY TIME:  None COMPLICATIONS: None immediate. Estimated blood loss: <25 mL PROCEDURE: Informed written consent was obtained from the patient after a thorough discussion of the procedural risks, benefits and alternatives. All questions were addressed. Maximal Sterile Barrier Technique was utilized including caps, mask, sterile gowns, sterile gloves, sterile drape, hand hygiene and skin antiseptic. A timeout was performed prior to the initiation of the procedure. The patient was positioned prone and non-contrast localization CT was performed of the pelvis to demonstrate the  iliac marrow spaces. Maximal barrier sterile technique utilized including caps, mask, sterile gowns, sterile gloves, large sterile drape, hand hygiene, and betadine prep. Under sterile conditions and local anesthesia, an 11 gauge coaxial bone biopsy needle was advanced into the right iliac marrow space. Needle position was confirmed with CT imaging. Initially, bone marrow aspiration was performed. Next, the 11 gauge outer cannula was utilized to obtain a right iliac bone marrow core biopsy. Needle was removed. Hemostasis was obtained with compression. The patient tolerated the procedure well. Samples were prepared with the cytotechnologist. IMPRESSION: CT-guided bone marrow aspiration and biopsy of the right iliac bone. Signed, Criselda Peaches, MD, [place] [facility] Radiology Electronically Signed   By: Jacqulynn Cadet M.D.   On: 12/01/2018 10:01   Ct Bone Marrow Biopsy & Aspiration  Result Date: 12/01/2018 INDICATION: 78 year old female with diffuse marrow signal abnormality on MRI concerning for multiple myeloma versus diffuse metastatic disease. She presents for bone marrow biopsy and aspiration for further evaluation. EXAM: CT GUIDED BONE MARROW ASPIRATION AND CORE BIOPSY Interventional Radiologist:  Criselda Peaches, MD MEDICATIONS: None. ANESTHESIA/SEDATION: Moderate (conscious) sedation was employed during this procedure. A total of 1 milligrams versed and 100 micrograms fentanyl were administered intravenously. The patient's level of consciousness and vital signs were monitored continuously by radiology nursing throughout the procedure under my direct supervision. Total monitored sedation time: 14 minutes FLUOROSCOPY TIME:  None COMPLICATIONS: None immediate. Estimated blood loss: <25 mL PROCEDURE: Informed written consent was obtained from the patient after a thorough discussion of the procedural risks, benefits and alternatives. All questions  were addressed. Maximal Sterile Barrier Technique was utilized including caps, mask, sterile gowns, sterile gloves, sterile drape, hand hygiene and skin antiseptic. A timeout was performed prior to the initiation of the procedure. The patient was positioned prone and non-contrast localization CT was performed of the pelvis to demonstrate the iliac marrow spaces. Maximal barrier sterile technique utilized including caps, mask, sterile gowns, sterile gloves,  large sterile drape, hand hygiene, and betadine prep. Under sterile conditions and local anesthesia, an 11 gauge coaxial bone biopsy needle was advanced into the right iliac marrow space. Needle position was confirmed with CT imaging. Initially, bone marrow aspiration was performed. Next, the 11 gauge outer cannula was utilized to obtain a right iliac bone marrow core biopsy. Needle was removed. Hemostasis was obtained with compression. The patient tolerated the procedure well. Samples were prepared with the cytotechnologist. IMPRESSION: CT-guided bone marrow aspiration and biopsy of the right iliac bone. Signed, Criselda Peaches, MD, [place] [facility] Radiology Electronically Signed   By: Jacqulynn Cadet M.D.   On: 12/01/2018 10:01    Labs:  CBC: Recent Labs    04/04/18 0849 11/28/18 1548 12/01/18 0757  WBC 3.2* 4.2 4.8  HGB 12.3 10.0* 10.4*  HCT 36.8 29.8* 33.5*  PLT 210.0 348.0 294    COAGS: Recent Labs    12/01/18 0757  INR 1.1    BMP: Recent Labs    04/04/18 0849 11/28/18 1548  NA 141 137  K 4.4 4.2  CL 104 102  CO2 30 26  GLUCOSE 112* 105*  BUN 13 19  CALCIUM 9.2 8.9  CREATININE 0.68 0.68    LIVER FUNCTION TESTS: Recent Labs    04/04/18 0849 11/28/18 1548  BILITOT 0.5 0.3  AST 12 10  ALT 12 10  ALKPHOS 49 63  PROT 7.5 9.9*  10.0*  10.2*  ALBUMIN 4.2 3.1*    TUMOR MARKERS: No results for input(s): AFPTM, CEA, CA199, CHROMGRNA in the last 8760  hours.  Assessment and Plan:  78 year old with multiple myeloma, multiple vertebral body lesions and multiple vertebral body compression fractures.  Based on the MRI from 11/27/2018, there are definite compression fractures at T3, T11 and T12 and subtle compression fractures involving T5, T9 and L1.  Although the patient has chronic diffuse back pain, the physical exam findings are relatively underwhelming.  She has mild point tenderness in the thoracolumbar region that could correspond with the T11 and T12 compression fractures.  The T3 lesion has evidence of epidural involvement and narrowing of the left foramen but this would not be amendable to vertebroplasty or radiofrequency ablation.  She has no point tenderness in the T3 region.  We discussed kyphoplasty/vertebroplasty with OsteoCool radiofrequency ablation of the spinal lesions.  However, it is not clear what levels are giving her the most problems.  Patient is scheduled to start therapy for her myeloma in approximately 1 week.  At this time, the patient would like to see if she gets any improvement with the medical therapy before we proceed with any vertebral augmentation or radiofrequency ablation.  Patient has a good understanding of the possible percutaneous interventions and we will wait and see how she does with her systemic therapy.  Patient will follow-up as needed.  Thank you for this interesting consult.  I greatly enjoyed meeting Megan Olson and look forward to participating in their care.  A copy of this report was sent to the requesting provider on this date.  Electronically Signed: Burman Riis 12/13/2018, 3:23 PM   I spent a total of  30 Minutes   in face to face in clinical consultation, greater than 50% of which was counseling/coordinating care for multiple myeloma and compression fractures.

## 2018-12-14 ENCOUNTER — Telehealth: Payer: Self-pay

## 2018-12-14 ENCOUNTER — Inpatient Hospital Stay: Payer: Medicare HMO

## 2018-12-14 ENCOUNTER — Other Ambulatory Visit: Payer: Self-pay

## 2018-12-14 NOTE — Telephone Encounter (Signed)
Oral Oncology Patient Advocate Encounter  Prior Authorization for Revlimid has been approved.    PA# [id number] Effective dates: 10/03/18 through 10/05/19  Oral Oncology Clinic will continue to follow.   [place] Patient [place] Phone [phone] Fax [phone] 12/14/2018    9:45 AM

## 2018-12-14 NOTE — Telephone Encounter (Signed)
Oral Chemotherapy Pharmacist Encounter   I spoke with patient and husband face to face in chemotherapy education class for overview of: Revlimid (lenalidomide) for the treatment of multiple myeloma not having achieved remission in conjunction with Velcade (bortezomib) injections and pulse dexamethasone, planned duration 4-6 cycles then reassessment.   Counseled patient on administration, dosing, side effects, monitoring, drug-food interactions, safe handling, storage, and disposal.  Patient will take Revlimid '15mg'$  capsules, 1 capsule by mouth once daily, without regard to food, with a full glass of water.  Revlimid will be given 14 days on, 7 days off, repeat every 21 days.  Dexamethasone prescription has not yet been written, MD to verify dosing with patient's eye doctor due to extensive ocular history.  Velcade will be administered at 1.3 mg/m2 on days 1, 4, 8, and 11 of every 21 day cycle, planned start date 12/20/18  Revlimid start date: planned for 01/10/19 (start with cycle 2)  Adverse effects of Revlimid include but are not limited to: nausea, constipation, diarrhea, abdominal pain, rash, fatigue, drug fever, and decreased blood counts.    Reviewed with patient importance of keeping a medication schedule and plan for any missed doses.  Megan Olson voiced understanding and appreciation.   All questions answered. Medication reconciliation performed and medication/allergy list updated.  Patient will pick up acyclovir for VZV prophylaxis and aspirin '81mg'$  for VTE prophylaxis. Importance of both of these supportive therapy medications reviewed with patient.  Copayment for patient's Revlimid is ~$2500 for 1st fill. Oral oncology patient advocate is working with patient to obtain foundation copayment grant to cover out of pocket expenses for Revlimid.  Once grant is obtained, Revlimid will be e-scribed to appropriate specialty pharmacy for dispensing. We will update patient on dispensing  pharmacy at that time.  Patient knows to call the office with questions or concerns. Oral Oncology Clinic will continue to follow.  Johny Drilling, PharmD, BCPS, BCOP  12/14/2018    12:04 PM Oral Oncology Clinic [phone]

## 2018-12-14 NOTE — Telephone Encounter (Signed)
Oral Oncology Patient Advocate Encounter  Received notification from [facility] that prior authorization for Revlimid is required.  PA submitted on CoverMyMeds Key [id number] Status is pending  Oral Oncology Clinic will continue to follow.  [place] Patient [place] Phone [phone] Fax [phone] 12/14/2018    8:45 AM

## 2018-12-15 ENCOUNTER — Telehealth: Payer: Self-pay

## 2018-12-15 ENCOUNTER — Telehealth: Payer: Self-pay | Admitting: *Deleted

## 2018-12-15 ENCOUNTER — Encounter (HOSPITAL_COMMUNITY): Payer: Self-pay | Admitting: Sports Medicine

## 2018-12-15 ENCOUNTER — Other Ambulatory Visit: Payer: Self-pay | Admitting: Hematology

## 2018-12-15 MED ORDER — OXYCODONE-ACETAMINOPHEN 5-325 MG PO TABS: 1.0000 | ORAL_TABLET | Freq: Four times a day (QID) | ORAL | 0 refills | Status: DC | PRN

## 2018-12-15 MED ORDER — LENALIDOMIDE 15 MG PO CAPS: 15.0000 mg | ORAL_CAPSULE | Freq: Every day | ORAL | 0 refills | Status: DC

## 2018-12-15 NOTE — Telephone Encounter (Signed)
Oral Oncology Pharmacist Encounter  Revlimid 15mg  capsule prescription has been e-scribed to [facility]. I have faxed supporting information to pharmacy including demographic information, medication list, insurance card, prior authorization approval, and [employer] grant information to [phone].  I spoke with patient to update her on dispensing pharmacy and patient provided phone number to [facility], [phone].  Patient informed that pharmacy should be contacting her in the next couple days to schedule shipment of her 1st cycle of Revlimid.  Patient informed to start Revlimid on day 1 of cycle 1 (12/20/18) or as soon as she receives it, if after 3/17.  All questions answered. Patient knows to call the office with any additional questions or concerns.  Johny Drilling, PharmD, BCPS, BCOP  12/15/2018 10:17 AM Oral Oncology Clinic [phone]

## 2018-12-15 NOTE — Telephone Encounter (Signed)
Called to request refill of percocet. Dr. Irene Limbo sent refill. Contacted patient to inform her. Patient had medication questiions - reviewed propose and directions of acyclovir, compazine and zofran with patient. Patient verbalized understanding.

## 2018-12-15 NOTE — Telephone Encounter (Signed)
Oral Oncology Patient Advocate Encounter  I was successful at securing a grant with [facility] for $10,000. This will keep the out of pocket expense for Revlimid at $0. The grant information is as follows and has been shared with [facility].  Approval dates: 11/14/18-11/14/19 ID: [id number] Group: [id number] BIN: [id number] PCN: [id number]  I called the patient and gave her the good news, she verbalized understanding and great appreciation.  [place] Patient [place] Phone [phone] Fax [phone] 12/15/2018   10:47 AM

## 2018-12-19 ENCOUNTER — Telehealth: Payer: Self-pay | Admitting: *Deleted

## 2018-12-19 ENCOUNTER — Other Ambulatory Visit: Payer: Self-pay

## 2018-12-19 ENCOUNTER — Emergency Department (HOSPITAL_COMMUNITY): Payer: Medicare HMO

## 2018-12-19 ENCOUNTER — Encounter (HOSPITAL_COMMUNITY): Payer: Self-pay | Admitting: *Deleted

## 2018-12-19 ENCOUNTER — Emergency Department (HOSPITAL_COMMUNITY)
Admission: EM | Admit: 2018-12-19 | Discharge: 2018-12-19 | Disposition: A | Discharge status: Home / Self Care | Payer: Medicare HMO | Attending: Emergency Medicine | Admitting: Emergency Medicine

## 2018-12-19 DIAGNOSIS — C9000 Multiple myeloma not having achieved remission: Secondary | ICD-10-CM | POA: Diagnosis not present

## 2018-12-19 DIAGNOSIS — R609 Edema, unspecified: Secondary | ICD-10-CM | POA: Diagnosis not present

## 2018-12-19 DIAGNOSIS — R1084 Generalized abdominal pain: Secondary | ICD-10-CM

## 2018-12-19 DIAGNOSIS — R0602 Shortness of breath: Secondary | ICD-10-CM | POA: Diagnosis not present

## 2018-12-19 DIAGNOSIS — R2243 Localized swelling, mass and lump, lower limb, bilateral: Secondary | ICD-10-CM | POA: Insufficient documentation

## 2018-12-19 DIAGNOSIS — I10 Essential (primary) hypertension: Secondary | ICD-10-CM | POA: Insufficient documentation

## 2018-12-19 DIAGNOSIS — K5900 Constipation, unspecified: Secondary | ICD-10-CM | POA: Insufficient documentation

## 2018-12-19 DIAGNOSIS — Z87891 Personal history of nicotine dependence: Secondary | ICD-10-CM | POA: Insufficient documentation

## 2018-12-19 DIAGNOSIS — R600 Localized edema: Secondary | ICD-10-CM

## 2018-12-19 HISTORY — DX: Malignant (primary) neoplasm, unspecified: C80.1

## 2018-12-19 LAB — CBC WITH DIFFERENTIAL/PLATELET
Abs Immature Granulocytes: 0.02 10*3/uL (ref 0.00–0.07)
BASOS ABS: 0 10*3/uL (ref 0.0–0.1)
Basophils Relative: 1 %
EOS PCT: 1 %
Eosinophils Absolute: 0.1 10*3/uL (ref 0.0–0.5)
HEMATOCRIT: 29.9 % — AB (ref 36.0–46.0)
Hemoglobin: 9.4 g/dL — ABNORMAL LOW (ref 12.0–15.0)
Immature Granulocytes: 0 %
Lymphocytes Relative: 21 %
Lymphs Abs: 1.1 10*3/uL (ref 0.7–4.0)
MCH: 32.9 pg (ref 26.0–34.0)
MCHC: 31.4 g/dL (ref 30.0–36.0)
MCV: 104.5 fL — ABNORMAL HIGH (ref 80.0–100.0)
Monocytes Absolute: 0.5 10*3/uL (ref 0.1–1.0)
Monocytes Relative: 10 %
NRBC: 0 % (ref 0.0–0.2)
Neutro Abs: 3.5 10*3/uL (ref 1.7–7.7)
Neutrophils Relative %: 67 %
Platelets: 333 10*3/uL (ref 150–400)
RBC: 2.86 MIL/uL — ABNORMAL LOW (ref 3.87–5.11)
RDW: 15.2 % (ref 11.5–15.5)
WBC: 5.1 10*3/uL (ref 4.0–10.5)

## 2018-12-19 LAB — COMPREHENSIVE METABOLIC PANEL
ALT: 13 U/L (ref 0–44)
ANION GAP: 9 (ref 5–15)
AST: 13 U/L — ABNORMAL LOW (ref 15–41)
Albumin: 2.4 g/dL — ABNORMAL LOW (ref 3.5–5.0)
Alkaline Phosphatase: 54 U/L (ref 38–126)
BUN: 21 mg/dL (ref 8–23)
CALCIUM: 8.7 mg/dL — AB (ref 8.9–10.3)
CO2: 25 mmol/L (ref 22–32)
Chloride: 102 mmol/L (ref 98–111)
Creatinine, Ser: 0.8 mg/dL (ref 0.44–1.00)
GFR calc Af Amer: >60 mL/min (ref >60)
GFR calc non Af Amer: >60 mL/min (ref >60)
Glucose, Bld: 127 mg/dL — ABNORMAL HIGH (ref 70–99)
Potassium: 3.7 mmol/L (ref 3.5–5.1)
Sodium: 136 mmol/L (ref 135–145)
Total Bilirubin: 0.4 mg/dL (ref 0.3–1.2)
Total Protein: 10.3 g/dL — ABNORMAL HIGH (ref 6.5–8.1)

## 2018-12-19 LAB — BRAIN NATRIURETIC PEPTIDE: B Natriuretic Peptide: 183.7 pg/mL — ABNORMAL HIGH (ref 0.0–100.0)

## 2018-12-19 MED ORDER — SODIUM CHLORIDE (PF) 0.9 % IJ SOLN
INTRAMUSCULAR | Status: AC
  Filled 2018-12-19: qty 50

## 2018-12-19 MED ORDER — FUROSEMIDE 20 MG PO TABS: 20.0000 mg | ORAL_TABLET | Freq: Every day | ORAL | 0 refills | Status: DC

## 2018-12-19 MED ORDER — IOPAMIDOL (ISOVUE-300) INJECTION 61%
INTRAVENOUS | Status: AC
  Filled 2018-12-19: qty 100

## 2018-12-19 MED ORDER — MAGNESIUM HYDROXIDE 400 MG/5ML PO SUSP: 15.0000 mL | Freq: Every day | ORAL | 0 refills | Status: DC | PRN

## 2018-12-19 MED ORDER — IOPAMIDOL (ISOVUE-300) INJECTION 61%
100.0000 mL | Freq: Once | INTRAVENOUS | Status: AC | PRN
  Administered 2018-12-19: 75 mL via INTRAVENOUS

## 2018-12-19 NOTE — ED Provider Notes (Signed)
Emergency Department Provider Note   I have reviewed the triage vital signs and the nursing notes.   HISTORY  Chief Complaint Leg Swelling and Back Pain   HPI Megan Olson is a 78 y.o. female with PMH of HTN and multiple myeloma with plan to start chemotherapy tomorrow with Oncology presents to the emergency department for evaluation of constipation, bilateral lower extremity swelling and intermittent pain in her back.  Her back pain symptoms have been ongoing and she notes known history of multiple myeloma.  She is taking oxycodone for pain symptoms.  Pain is severe and worse with movement.  No numbness or weakness.  She notes that as a result of taking her oxycodone she has developed constipation.  2 weeks ago she developed some constipation symptoms that resolved with milk of magnesia.  She did not continue taking this, however, but has continued taking Senna-S and Miralax.  She denies any fevers or chills.  She continues to have small bowel movements.   Patient also notes some lower extremity swelling.  She reports equal swelling in both legs.  No significant pain.  She reports shortness of breath only when she has a spasm in her back but not at baseline.  No chest pain.  No exertional dyspnea but she does find it difficult to move and walk both due to pain and heaviness in the legs.   Past Medical History:  Diagnosis Date   [facility]cer [facility])    multiple myeloma   Glaucoma    HYPERTENSION 03/11/2007   OSTEOPENIA 03/11/2007    Patient Active Problem List   Diagnosis Date Noted   Multiple myeloma not having achieved remission ([place]) 12/12/2018   Counseling regarding advance care planning and goals of care 12/12/2018   Pain in thoracic spine 11/03/2018   History of adenomatous polyp of colon 07/23/2015   Hyperglycemia 03/29/2015   Raynaud phenomenon 10/19/2014   Syncope 10/19/2014   Hyperlipidemia 10/18/2014   Glaucoma 01/05/2011   Essential hypertension 03/11/2007    Osteopenia 03/11/2007    Past Surgical History:  Procedure Laterality Date   BREAST EXCISIONAL BIOPSY Right 2000   BREAST LUMPECTOMY  1990   benign   DILATION AND CURETTAGE OF UTERUS     bleeding at menopause. No uterine cancer   IR RADIOLOGIST EVAL & MGMT  12/13/2018   TONSILLECTOMY     age 37   Allergies Ace inhibitors; Diamox [acetazolamide]; Sulfamethoxazole; and Penicillins  Family History  Problem Relation Age of Onset   Heart disease Mother        CHF mother died 20   Arthritis Mother    Glaucoma Mother        sister as well   Alcohol abuse Father    Suicidality Father    Cancer Sister        lung cancer, smoker   Heart disease Sister        aortic valve replacement   Hyperlipidemia Brother    Hypertension Brother    COPD Brother    Arthritis Sister    Hypertension Sister    Glaucoma Sister    Hashimoto's thyroiditis Sister    Hypertension Son    Stroke Maternal Grandmother    Colon cancer Neg Hx    Colon polyps Neg Hx     Social History Social History   Tobacco Use   Smoking status: Former Smoker    Packs/day: 0.50    Years: 7.00    Pack years: 3.50  Types: Cigarettes    Last attempt to quit: 01/04/1961    Years since quitting: 57.9   Smokeless tobacco: Never Used  Substance Use Topics   Alcohol use: Yes    Alcohol/week: 1.0 standard drinks    Types: 1 Standard drinks or equivalent per week   Drug use: No    Review of Systems  Constitutional: No fever/chills Eyes: No visual changes. ENT: No sore throat. Cardiovascular: Denies chest pain. Positive bilateral LE edema.  Respiratory: Denies shortness of breath. Gastrointestinal: No abdominal pain.  No nausea, no vomiting.  No diarrhea. Positive constipation. Genitourinary: Negative for dysuria. Musculoskeletal: Positive back pain.  Skin: Negative for rash. Neurological: Negative for headaches, focal weakness or numbness.  10-point ROS otherwise  negative.  ____________________________________________   PHYSICAL EXAM:  VITAL SIGNS: ED Triage Vitals [12/19/18 1933]  Enc Vitals Group     BP (!) 163/85     Pulse Rate 90     Resp 16     Temp 98.4 F (36.9 C)     Temp Source Oral     SpO2 99 %     Pain Score 9   Constitutional: Alert and oriented. Well appearing and in no acute distress. Eyes: Conjunctivae are normal. Head: Atraumatic. Nose: No congestion/rhinnorhea. Mouth/Throat: Mucous membranes are moist. Neck: No stridor.  Cardiovascular: Normal rate, regular rhythm. Good peripheral circulation. Grossly normal heart sounds.   Respiratory: Normal respiratory effort.  No retractions. Lungs CTAB. Gastrointestinal: Soft and nontender. Mild distension. Musculoskeletal: No lower extremity tenderness with 2+ pitting edema in the bilateral LE. No gross deformities of extremities. Neurologic:  Normal speech and language. No gross focal neurologic deficits are appreciated.  Skin:  Skin is warm, dry and intact. No rash noted.  ____________________________________________   LABS (all labs ordered are listed, but only abnormal results are displayed)  Labs Reviewed  COMPREHENSIVE METABOLIC PANEL - Abnormal; Notable for the following components:      Result Value   Glucose, Bld 127 (*)    Calcium 8.7 (*)    Total Protein 10.3 (*)    Albumin 2.4 (*)    AST 13 (*)    All other components within normal limits  BRAIN NATRIURETIC PEPTIDE - Abnormal; Notable for the following components:   B Natriuretic Peptide 183.7 (*)    All other components within normal limits  CBC WITH DIFFERENTIAL/PLATELET - Abnormal; Notable for the following components:   RBC 2.86 (*)    Hemoglobin 9.4 (*)    HCT 29.9 (*)    MCV 104.5 (*)    All other components within normal limits   ____________________________________________  RADIOLOGY  Dg Chest 2 View  Result Date: 12/19/2018 CLINICAL DATA:  Shortness of breath, history of multiple  myeloma EXAM: CHEST - 2 VIEW COMPARISON:  11/19/2016 FINDINGS: The heart size and mediastinal contours are within normal limits. Both lungs are clear. The visualized skeletal structures are unremarkable. IMPRESSION: No active cardiopulmonary disease. Electronically Signed   By: Kathreen Devoid   On: 12/19/2018 20:47   Ct Abdomen Pelvis W Contrast  Result Date: 12/19/2018 CLINICAL DATA:  History of multiple myeloma. Back pain. Abdominal distention. EXAM: CT ABDOMEN AND PELVIS WITH CONTRAST TECHNIQUE: Multidetector CT imaging of the abdomen and pelvis was performed using the standard protocol following bolus administration of intravenous contrast. CONTRAST:  103m ISOVUE-300 IOPAMIDOL (ISOVUE-300) INJECTION 61% COMPARISON:  None. FINDINGS: Lower chest: Airspace opacities are noted in the lung bases including right middle lobe, lingula and both lower lobes. This  could reflect atelectasis or scarring. Heart is borderline in size. No effusions. Hepatobiliary: Small hypodensity anteriorly in the right hepatic lobe, likely small cyst. No suspicious focal hepatic abnormality or biliary ductal dilatation. Gallbladder unremarkable. Pancreas: No focal abnormality or ductal dilatation. Spleen: No focal abnormality.  Normal size. Adrenals/Urinary Tract: No adrenal abnormality. No focal renal abnormality. No stones or hydronephrosis. Urinary bladder is unremarkable. Stomach/Bowel: Large stool burden throughout the colon. Mild gaseous distention of the colon. No evidence of bowel obstruction. Vascular/Lymphatic: Aortic atherosclerosis. No enlarged abdominal or pelvic lymph nodes. Reproductive: No adnexal mass. Other: No free fluid or free air. Musculoskeletal: 3.2 cm expansile lytic destructive lesion noted in the right sacrum multiple compression deformities throughout the thoracolumbar spine including T11, T12, L1 and L3. IMPRESSION: Large stool burden throughout the colon compatible with constipation. Large amount of stool in  the rectosigmoid colon, cannot exclude early fecal impaction. Lytic destructive expansile lesion in the right sacral ala. Numerous compression fractures in the lower thoracic and lumbar spine. Findings likely represent known myeloma. Bibasilar atelectasis or scarring. Aortic atherosclerosis. Electronically Signed   By: Rolm Baptise M.D.   On: 12/19/2018 22:17    ____________________________________________   PROCEDURES  Procedure(s) performed:   Procedures  None  ____________________________________________   INITIAL IMPRESSION / ASSESSMENT AND PLAN / ED COURSE  Pertinent labs & imaging results that were available during my care of the patient were reviewed by me and considered in my medical decision making (see chart for details).  Patient presents to the emergency department for evaluation of multiple complaints including intermittent back pain, constipation, lower extremity swelling.  Patient states that her most pressing concern is her lower extremity swelling.  She does not appear volume overloaded diffusely.  She has isolated edema in the legs.  No rales on exam.  Chest x-ray with no pulmonary edema or hypoxemia.  Patient does have mildly distended abdomen with no significant tenderness.  CT imaging obtained which shows multiple bony changes related to multiple myeloma.  She has no findings on exam to suspect spinal cord emergency secondary to pathologic fracture.  She is on pain medication at baseline.  She has endorsed some moderate constipation.  She has had success with milk of magnesia in the past.  I did offer rectal exam and attempt at manual disimpaction but patient is not having significant rectal symptoms and due to her pain with lying down she would like to defer this for now.  She will go home and try the milk of magnesia along with enema.  She is due to see her oncologist tomorrow.  She will talk about additional pain medication options, constipation treatment, and follow-up  plan.  Discussed ED return precaution.   ____________________________________________  FINAL CLINICAL IMPRESSION(S) / ED DIAGNOSES  Final diagnoses:  Generalized abdominal pain  Constipation, unspecified constipation type  Multiple myeloma, remission status unspecified ([facility])  Bilateral lower extremity edema     MEDICATIONS GIVEN DURING THIS VISIT:  Medications  sodium chloride (PF) 0.9 % injection (has no administration in time range)  iopamidol (ISOVUE-300) 61 % injection 100 mL (75 mLs Intravenous Contrast Given 12/19/18 2116)     NEW OUTPATIENT MEDICATIONS STARTED DURING THIS VISIT:  Current Discharge Medication List    START taking these medications   Details  furosemide (LASIX) 20 MG tablet Take 1 tablet (20 mg total) by mouth daily for 3 days. Qty: 3 tablet, Refills: 0    magnesium hydroxide (MILK OF MAGNESIA) 400 MG/5ML suspension Take 15 mLs  by mouth daily as needed for mild constipation or moderate constipation. Qty: 355 mL, Refills: 0        Note:  This document was prepared using Dragon voice recognition software and may include unintentional dictation errors.  Nanda Quinton, MD Emergency Medicine    Rogenia Werntz, [facility], MD 12/19/18 [phone]

## 2018-12-19 NOTE — Discharge Instructions (Signed)
You have moderate constipation on CT and multiple bone lesions that are likely causing your pain. Discuss further pain medication options and constipation management with your Oncologist tomorrow. Use the Milk of Magnesia as directed. Take 3 days of lasix for the leg swelling. This will cause you to urinate frequently so make plans to be near a bathroom. Return to the ED with any worsening pain, fever, chills, or other concerning symptoms.

## 2018-12-19 NOTE — Telephone Encounter (Signed)
Patient called. Pain has increased significantly in last 4 days.  Experiencing pain in lower back, 10/10, slight decrease with pain med. Taking percocet every 4-5 hours. Having muscle spasms in upper back, now unable to use walker due to pain/spasms. Has become constipated with pain med use and also unable to bear down with BM due to pain. Lower legs are swollen and legs are weak.Having difficulty picking up legs to walk.  Information given to Dr. Irene Limbo. He advised patient to go to ED for evaluation. Patient given information and verbalized understanding. Will go to [facility] ED tonight.

## 2018-12-19 NOTE — ED Triage Notes (Signed)
Pt reports dx of multiple myeloma.  She endorses thoracic pain of which she has spasms.  She also reports BLE swelling x 2 weeks, but has worsened over the last 3-4 days.  She also endorses distended abdomen, she has been taking percocet for pain, and is possibly constipated.  Hx of constipation.

## 2018-12-19 NOTE — Progress Notes (Signed)
HEMATOLOGY/ONCOLOGY CONSULTATION NOTE  Date of Service: 12/20/2018  Patient Care Team: [name], MD as PCP - General (Family Medicine)  Dr. Ronney Lion as [place] Specialist  [phone]  CHIEF COMPLAINTS/PURPOSE OF CONSULTATION:  Multiple Myeloma  HISTORY OF PRESENTING ILLNESS:   Megan Olson is a wonderful 78 y.o. female who has been referred to [place] by Dr. 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 She is accompanied today by her husband. The pt reports that she is doing well overall.   The pt notes that she was doing extensive yard work in October 2019, and began to feel back pain a few days after this, which she attributed to muscle pain. She recalls taking a deep breath, and developing sudden acute pain, and presented to care with her PCP's office. She began exercises for her back pain, without relief, then began PT without relief, then was referred to Dr. Paulla Fore in sports medicine in late January 2020. She had an XR which revealed a compression fracture at T11, then subsequently had an MRI, and a bone marrow biopsy. The pt notes that her back pain "seemed to move around." She endorses pain "up the whole left side" of her back.  The pt notes that she is not able to stand up straight due to her back pain, which she feels limits her ability to take a deep breath, and endorses pain exacerbation when she does take a deep breath. The pt needs assistance from sitting to lying from her husband. She is using about 3 Percocet a day.  The pt notes worsened constipation since beginning Percocet, and notes that her most recent laxative order was not covered by her insurance. She is using prune juice and milk of magnesia. She took Senokot S BID without relief.  The pt reports that prior to her recent back pain, she had very few medical concerns. She endorses controlled BP, and has been monitored for DM but after closely  watching her diet her A1C decreased to 5.8. She has glaucoma, and has had surgery in both eyes. Right eye with stent. The pt sees Dr. Edilia Bo at [facility] for her eye care. The pt notes that her vision has been recently "pretty good." The pt notes that she has been advised to limit treatment with steroids for her glaucoma. She denies heart or lung problems. Denies previous back problems. Last DEXA scan 3 years ago, and endorses osteopenia. She notes that she took Fosamax for 3-4 years, and stopped 5-6 years ago. She takes Vitamin D, a multivitamin, and magnesium.  The pt notes that she quit smoking cigarettes when she was 27, started when she was about 20. The pt consumes alcohol rarely, but not since beginning narcotics. 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  Of note prior to the patient's visit today, pt has had an MRI Thoracic Spine completed on 11/27/18 with results revealing Multifocal marrow signal abnormality consistent with metastatic disease or multiple myeloma. The patient has multiple compression fractures most consistent with pathologic injuries. Extensive marrow signal abnormality makes determining age of the fractures difficult but edema is most intense in T3. 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  Most recent lab results (12/01/18) of CBC w/diff is as follows: all values are WNL except for RBC at 3.17, HGB at 10.4, HCT at 33.5, MCV at 105.7. 11/28/18 CMP  revealed all values WNL except for Glucose at 105, Total Protein at 10.2, Albumin at 3.1 11/30/18 24HR UPEP revealed all values WNL except for M-spike at 75.1%. 11/28/18 Bega-2 microglobulin slightly elevated at 2.6 11/28/18 SPEP revealed M spike at 4.6g 11/28/18 Immunoglobulins revealed IgG at 6181, IgA at 24, IgM at 16, and IgE at 6.  On review of systems, pt reports constipation, back pain, stable energy levels, ankle swelling, tenderness at T3,  lower back pain, and denies abdominal pains, neck pain, and any other symptoms.   On PMHx the pt reports redundant colon, single hemorrhoid, glaucoma, HTN, osteopenia, Tonsillectomy, right eye stent and multiple eye surgeries. On Social Hx the pt reports rare alcohol use, smoked cigarettes between ages 68 and 63. 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 On Family Hx the pt reports sister died from small cell lung cancer and was a lifelong smoker, brother's daughter died of breast cancer with BRCA1 and BRCA2 mutations. Cousin with female breast cancer.  Interval History:   Megan Olson returns today for management and evaluation of her newly diagnosed multiple myeloma. The patient's last visit with [place] was on 12/12/18. She is accompanied today by her husband. The pt reports that she is doing well overall.   The pt reports that she was given Lasix prescription yesterday when she presented to the ED. She endorses leg swelling. She denies a loss of muscle strength in her legs. The pt endorses "terrible pain" in her back. She is taking 3-4 Oxycodone each day. She has taken 2 tablets Senna S in the evening and Miralax once a day, but continues to endorse constipation. The pt notes that she is eating 3 meals a day, smaller than normal though. She endorses an element of fullness in her abdomen. She notes that her back pain limits her ability to push to move her bowels.  Lab results today (12/20/18) of CBC w/diff and CMP is as follows: all values are WNL except for RBC at 2.76, HGB at 9.1, HCT at 28.8, MCV at 104.3, Glucose at 118, Calcium at 8.8, Total Protein at 10.6, Albumin at 2.3, AST at 10. 12/20/18 Vitamin D and Sed Rate are pending 12/20/18 MMP and SFLC are pending  On review of systems, pt reports leg swelling, continued back pain, eating 3 meals a day, constipation, and denies loss of muscle strength, new bone pains, and any other symptoms.  MEDICAL HISTORY:  Past Medical History:  Diagnosis Date   Cancer  [facility]    multiple myeloma   Glaucoma    HYPERTENSION 03/11/2007   OSTEOPENIA 03/11/2007    SURGICAL HISTORY: Past Surgical History:  Procedure Laterality Date   BREAST EXCISIONAL BIOPSY Right 2000   BREAST LUMPECTOMY  1990   benign   DILATION AND CURETTAGE OF UTERUS     bleeding at menopause. No uterine cancer   IR RADIOLOGIST EVAL & MGMT  12/13/2018   TONSILLECTOMY     age 41    SOCIAL HISTORY: Social History   Socioeconomic History   Marital status: Married    Spouse name: Not on file   Number of children: Not on file   Years of education: Not on file   Highest education level: Not on file  Occupational History   Not on file  Social Needs   Financial resource strain: Not on file   Food insecurity:    Worry: Not on file    Inability: Not on file   Transportation needs:    Medical: Not on  file    Non-medical: Not on file  Tobacco Use   Smoking status: Former Smoker    Packs/day: 0.50    Years: 7.00    Pack years: 3.50    Types: Cigarettes    Last attempt to quit: 01/04/1961    Years since quitting: 57.9   Smokeless tobacco: Never Used  Substance and Sexual Activity   Alcohol use: Yes    Alcohol/week: 1.0 standard drinks    Types: 1 Standard drinks or equivalent per week   Drug use: No   Sexual activity: Not Currently  Lifestyle   Physical activity:    Days per week: Not on file    Minutes per session: Not on file   Stress: Not on file  Relationships   Social connections:    Talks on phone: Not on file    Gets together: Not on file    Attends religious service: Not on file    Active member of club or organization: Not on file    Attends meetings of clubs or organizations: Not on file    Relationship status: Not on file   Intimate partner violence:    Fear of current or ex partner: Not on file    Emotionally abused: Not on file    Physically abused: Not on file    Forced sexual activity: Not on file  Other Topics Concern   Not  on file  Social History Narrative   Married. Lives with husband (patient of Dr. Yong Channel). 1 son. No grandkids. 1 granddog.       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, swing dancing, dinner, read       FAMILY HISTORY: Family History  Problem Relation Age of Onset   Heart disease Mother        CHF mother died 44   Arthritis Mother    Glaucoma Mother        sister as well   Alcohol abuse Father    Suicidality Father    Cancer Sister        lung cancer, smoker   Heart disease Sister        aortic valve replacement   Hyperlipidemia Brother    Hypertension Brother    COPD Brother    Arthritis Sister    Hypertension Sister    Glaucoma Sister    Hashimoto's thyroiditis Sister    Hypertension Son    Stroke Maternal Grandmother    Colon cancer Neg Hx    Colon polyps Neg Hx     ALLERGIES:  is allergic to ace inhibitors; diamox [acetazolamide]; sulfamethoxazole; and penicillins.  MEDICATIONS:  Current Outpatient Medications  Medication Sig Dispense Refill   acyclovir (ZOVIRAX) 400 MG tablet Take 1 tablet (400 mg total) by mouth 2 (two) times daily. 60 tablet 3   ALPHAGAN P 0.1 % SOLN Apply 1 drop topically See admin instructions. Apply 2 drops in right eye 3 times a day     amLODipine (NORVASC) 5 MG tablet Take 1 tablet (5 mg total) by mouth daily. 90 tablet 3   aspirin EC 81 MG tablet Take 81 mg by mouth daily.     bimatoprost (LUMIGAN) 0.03 % ophthalmic solution Place 1 drop into the right eye at bedtime.      Cholecalciferol (VITAMIN D3) 1000 UNITS CAPS Take 1,000 Units by mouth 2 (two) times daily.      co-enzyme Q-10  50 MG capsule Take 50 mg by mouth daily.       dorzolamide-timolol (COSOPT) 22.3-6.8 MG/ML ophthalmic solution Place 2 drops into both eyes 2 (two) times daily.   11   fentaNYL (DURAGESIC) 12 MCG/HR Place 1 patch onto the skin every 3 (three) days. 5  patch 0   furosemide (LASIX) 20 MG tablet Take 1 tablet (20 mg total) by mouth daily for 3 days. 3 tablet 0   lenalidomide (REVLIMID) 15 MG capsule Take 1 capsule (15 mg total) by mouth daily. Take on days 1-14 of each 21 day cycle. Cel[employer] JHER#[id number] 14 capsule 0   magnesium citrate SOLN Take 148 mLs (0.5 Bottles total) by mouth once for 1 dose. May repeat after 4-6 hours if no bowel movements 296 mL 1   magnesium hydroxide (MILK OF MAGNESIA) 400 MG/5ML suspension Take 15 mLs by mouth daily as needed for mild constipation or moderate constipation. 355 mL 0   Magnesium Oxide 500 MG TABS Take 1 tablet by mouth daily.       Multiple Vitamins-Minerals (MULTIVITAMIN WITH MINERALS) tablet Take 1 tablet by mouth daily.       ondansetron (ZOFRAN) 8 MG tablet Take 1 tablet (8 mg total) by mouth 2 (two) times daily as needed (Nausea or vomiting). 30 tablet 1   oxyCODONE-acetaminophen (PERCOCET) 5-325 MG tablet Take 1-2 tablets by mouth every 4 (four) hours as needed for moderate pain or severe pain. 60 tablet 0   polyethylene glycol (MIRALAX) packet Take 17 g by mouth daily. 30 each 1   prochlorperazine (COMPAZINE) 10 MG tablet Take 1 tablet (10 mg total) by mouth every 6 (six) hours as needed (Nausea or vomiting). 30 tablet 1   senna-docusate (SENOKOT-S) 8.6-50 MG tablet Take 2 tablets by mouth at bedtime. 60 tablet 2   No current facility-administered medications for this visit.     REVIEW OF SYSTEMS:    A 10+ POINT REVIEW OF SYSTEMS WAS OBTAINED including neurology, dermatology, psychiatry, cardiac, respiratory, lymph, extremities, GI, GU, Musculoskeletal, constitutional, breasts, reproductive, HEENT.  All pertinent positives are noted in the HPI.  All others are negative.  PHYSICAL EXAMINATION: ECOG PERFORMANCE STATUS: 1-2  . Vitals:   12/20/18 1429  BP: (!) 155/85  Pulse: 86  Resp: 18  Temp: 98 F (36.7 C)  SpO2: 100%   Filed Weights   12/20/18 1429  Weight: 109 lb 1.6 oz  (49.5 kg)   .Body mass index is 19.64 kg/m.  GENERAL:alert, in no acute distress and comfortable SKIN: no acute rashes, no significant lesions EYES: conjunctiva are pink and non-injected, sclera anicteric OROPHARYNX: MMM, no exudates, no oropharyngeal erythema or ulceration NECK: supple, no JVD LYMPH:  no palpable lymphadenopathy in the cervical, axillary or inguinal regions LUNGS: clear to auscultation b/l with normal respiratory effort HEART: regular rate & rhythm ABDOMEN:  normoactive bowel sounds , non tender, not distended. No palpable hepatosplenomegaly.  Extremity: 1+ pedal edema bilaterally  PSYCH: alert & oriented x 3 with fluent speech NEURO: no focal motor/sensory deficits   LABORATORY DATA:  I have reviewed the data as listed  . CBC Latest Ref Rng & Units 12/20/2018 12/19/2018 12/01/2018  WBC 4.0 - 10.5 K/uL 5.2 5.1 4.8  Hemoglobin 12.0 - 15.0 g/dL 9.1(L) 9.4(L) 10.4(L)  Hematocrit 36.0 - 46.0 % 28.8(L) 29.9(L) 33.5(L)  Platelets 150 - 400 K/uL 338 333 294    . CMP Latest Ref Rng & Units 12/20/2018 12/19/2018 11/28/2018  Glucose 70 - 99 mg/dL 118(H) 127(H) -  BUN 8 - 23 mg/dL 17 21 -  Creatinine 0.44 - 1.00 mg/dL 0.84 0.80 -  Sodium 135 - 145 mmol/L 138 136 -  Potassium 3.5 - 5.1 mmol/L 3.7 3.7 -  Chloride 98 - 111 mmol/L 103 102 -  CO2 22 - 32 mmol/L 24 25 -  Calcium 8.9 - 10.3 mg/dL 8.8(L) 8.7(L) -  Total Protein 6.5 - 8.1 g/dL 10.6(H) 10.3(H) 10.0(H)  Total Bilirubin 0.3 - 1.2 mg/dL 0.5 0.4 -  Alkaline Phos 38 - 126 U/L 57 54 -  AST 15 - 41 U/L 10(L) 13(L) -  ALT 0 - 44 U/L 12 13 -             12/01/18 BM Bx:   12/01/18 Flow Cytometry:       RADIOGRAPHIC STUDIES: I have personally reviewed the radiological images as listed and agreed with the findings in the report. Dg Chest 2 View  Result Date: 12/19/2018 CLINICAL DATA:  Shortness of breath, history of multiple myeloma EXAM: CHEST - 2 VIEW COMPARISON:  11/19/2016 FINDINGS: The heart size  and mediastinal contours are within normal limits. Both lungs are clear. The visualized skeletal structures are unremarkable. IMPRESSION: No active cardiopulmonary disease. Electronically Signed   By: Kathreen Devoid   On: 12/19/2018 20:47   Mr Thoracic Spine Wo Contrast  Result Date: 11/27/2018 CLINICAL DATA:  Severe thoracic spine pain for 3-4 months. No known injury. Age indeterminate compression fracture on recent plain films of the thoracic spine. EXAM: MRI THORACIC SPINE WITHOUT CONTRAST TECHNIQUE: Multiplanar, multisequence MR imaging of the thoracic spine was performed. No intravenous contrast was administered. COMPARISON:  PA and lateral chest 11/20/2011. Plain films thoracic spine 11/03/2018. FINDINGS: Alignment:  No listhesis.  Mild exaggeration of kyphosis noted. Vertebrae: Extensive marrow signal abnormality is seen throughout the thoracic spine. Marrow throughout the T3 and T5 vertebral bodies is almost completely replaced with abnormal signal and extends into the pedicles, better seen at T3. Marrow signal abnormality is also identified on scout imaging in the cervical spine, best seen in T3. The patient has compression fractures of the inferior endplate of T1, biconcave at T3, inferior endplate of T5, a biconcave at T9, superior endplate of I14, superior endplate of E31 and superior endplate of L2. Vertebral body height loss appears worst centrally at T3 where it is approximately 70%. Cord:  Normal signal throughout. Paraspinal and other soft tissues: Negative. Disc levels: C7-T1: Shallow central protrusion without stenosis. T2-3: Minimal bulge without stenosis. T2-3: There is epidural tumor centrally and eccentric toward the left posterior to T3 which extends into the left foramen. Epidural tumor effaces the ventral thecal sac without causing central canal stenosis. It could impact the exiting left T3 root. T3-4, T4-5: Negative. T5-6: Shallow central protrusion without stenosis. T7-8: Central disc  protrusion contacts the cord but the central canal appears open T8-9: Negative. T9-10: Negative. T10-11: Minimal bony retropulsion off the superior endplate of V40 without stenosis. T11-12: Minimal bony retropulsion off the superior endplate of G86 without stenosis. T12-L1: Negative. L1-2: Negative. IMPRESSION: Multifocal marrow signal abnormality consistent with metastatic disease or multiple myeloma. The patient has multiple compression fractures most consistent with pathologic injuries. Extensive marrow signal abnormality makes determining age of the fractures difficult but edema is most intense in T3. 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 These results will be called to the ordering clinician or representative by the Radiologist  Assistant, and communication documented in the PACS or zVision Dashboard. Electronically Signed   By: Inge Rise M.D.   On: 11/27/2018 16:07   Ct Abdomen Pelvis W Contrast  Result Date: 12/19/2018 CLINICAL DATA:  History of multiple myeloma. Back pain. Abdominal distention. EXAM: CT ABDOMEN AND PELVIS WITH CONTRAST TECHNIQUE: Multidetector CT imaging of the abdomen and pelvis was performed using the standard protocol following bolus administration of intravenous contrast. CONTRAST:  79m ISOVUE-300 IOPAMIDOL (ISOVUE-300) INJECTION 61% COMPARISON:  None. FINDINGS: Lower chest: Airspace opacities are noted in the lung bases including right middle lobe, lingula and both lower lobes. This could reflect atelectasis or scarring. Heart is borderline in size. No effusions. Hepatobiliary: Small hypodensity anteriorly in the right hepatic lobe, likely small cyst. No suspicious focal hepatic abnormality or biliary ductal dilatation. Gallbladder unremarkable. Pancreas: No focal abnormality or ductal dilatation. Spleen: No focal abnormality.  Normal size. Adrenals/Urinary Tract: No adrenal  abnormality. No focal renal abnormality. No stones or hydronephrosis. Urinary bladder is unremarkable. Stomach/Bowel: Large stool burden throughout the colon. Mild gaseous distention of the colon. No evidence of bowel obstruction. Vascular/Lymphatic: Aortic atherosclerosis. No enlarged abdominal or pelvic lymph nodes. Reproductive: No adnexal mass. Other: No free fluid or free air. Musculoskeletal: 3.2 cm expansile lytic destructive lesion noted in the right sacrum multiple compression deformities throughout the thoracolumbar spine including T11, T12, L1 and L3. IMPRESSION: Large stool burden throughout the colon compatible with constipation. Large amount of stool in the rectosigmoid colon, cannot exclude early fecal impaction. Lytic destructive expansile lesion in the right sacral ala. Numerous compression fractures in the lower thoracic and lumbar spine. Findings likely represent known myeloma. Bibasilar atelectasis or scarring. Aortic atherosclerosis. Electronically Signed   By: KRolm BaptiseM.D.   On: 12/19/2018 22:17   Ct Biopsy  Result Date: 12/01/2018 INDICATION: 78year old female with diffuse marrow signal abnormality on MRI concerning for multiple myeloma versus diffuse metastatic disease. She presents for bone marrow biopsy and aspiration for further evaluation. EXAM: CT GUIDED BONE MARROW ASPIRATION AND CORE BIOPSY Interventional Radiologist:  HCriselda Peaches MD MEDICATIONS: None. ANESTHESIA/SEDATION: Moderate (conscious) sedation was employed during this procedure. A total of 1 milligrams versed and 100 micrograms fentanyl were administered intravenously. The patient's level of consciousness and vital signs were monitored continuously by radiology nursing throughout the procedure under my direct supervision. Total monitored sedation time: 14 minutes FLUOROSCOPY TIME:  None COMPLICATIONS: None immediate. Estimated blood loss: <25 mL PROCEDURE: Informed written consent was obtained from the  patient after a thorough discussion of the procedural risks, benefits and alternatives. All questions were addressed. Maximal Sterile Barrier Technique was utilized including caps, mask, sterile gowns, sterile gloves, sterile drape, hand hygiene and skin antiseptic. A timeout was performed prior to the initiation of the procedure. The patient was positioned prone and non-contrast localization CT was performed of the pelvis to demonstrate the iliac marrow spaces. Maximal barrier sterile technique utilized including caps, mask, sterile gowns, sterile gloves, large sterile drape, hand hygiene, and betadine prep. Under sterile conditions and local anesthesia, an 11 gauge coaxial bone biopsy needle was advanced into the right iliac marrow space. Needle position was confirmed with CT imaging. Initially, bone marrow aspiration was performed. Next, the 11 gauge outer cannula was utilized to obtain a right iliac bone marrow core biopsy. Needle was removed. Hemostasis was obtained with compression. The patient tolerated the procedure well. Samples were prepared with the cytotechnologist. IMPRESSION: CT-guided bone marrow aspiration and biopsy of the right iliac bone. Signed,  Criselda Peaches, MD, [place] [facility] Radiology Electronically Signed   By: Jacqulynn Cadet M.D.   On: 12/01/2018 10:01   Ct Bone Marrow Biopsy & Aspiration  Result Date: 12/01/2018 INDICATION: 78 year old female with diffuse marrow signal abnormality on MRI concerning for multiple myeloma versus diffuse metastatic disease. She presents for bone marrow biopsy and aspiration for further evaluation. EXAM: CT GUIDED BONE MARROW ASPIRATION AND CORE BIOPSY Interventional Radiologist:  Criselda Peaches, MD MEDICATIONS: None. ANESTHESIA/SEDATION: Moderate (conscious) sedation was employed during this procedure. A total of 1 milligrams versed and 100 micrograms fentanyl were administered intravenously.  The patient's level of consciousness and vital signs were monitored continuously by radiology nursing throughout the procedure under my direct supervision. Total monitored sedation time: 14 minutes FLUOROSCOPY TIME:  None COMPLICATIONS: None immediate. Estimated blood loss: <25 mL PROCEDURE: Informed written consent was obtained from the patient after a thorough discussion of the procedural risks, benefits and alternatives. All questions were addressed. Maximal Sterile Barrier Technique was utilized including caps, mask, sterile gowns, sterile gloves, sterile drape, hand hygiene and skin antiseptic. A timeout was performed prior to the initiation of the procedure. The patient was positioned prone and non-contrast localization CT was performed of the pelvis to demonstrate the iliac marrow spaces. Maximal barrier sterile technique utilized including caps, mask, sterile gowns, sterile gloves, large sterile drape, hand hygiene, and betadine prep. Under sterile conditions and local anesthesia, an 11 gauge coaxial bone biopsy needle was advanced into the right iliac marrow space. Needle position was confirmed with CT imaging. Initially, bone marrow aspiration was performed. Next, the 11 gauge outer cannula was utilized to obtain a right iliac bone marrow core biopsy. Needle was removed. Hemostasis was obtained with compression. The patient tolerated the procedure well. Samples were prepared with the cytotechnologist. IMPRESSION: CT-guided bone marrow aspiration and biopsy of the right iliac bone. Signed, Criselda Peaches, MD, [place] [facility] Radiology Electronically Signed   By: Jacqulynn Cadet M.D.   On: 12/01/2018 10:01   Ir Radiologist Eval & Mgmt  Result Date: 12/13/2018 Please refer to notes tab for details about interventional procedure. (Op Note)   ASSESSMENT & PLAN:  78 y.o. female with  1. Multiple Myeloma with IgG Lambda specificity Labs upon  initial presentation from 12/01/18 CBC w/diff revealed HGB at 10.4 with MCV of 105.7. 11/28/18 CMP revealed Creatinine normal at 0.68 and Calcium normal at 8.9. 11/28/18 Beta 2 microglobulin at 2.'6mg'$  (also a reading at 4.'7mg'$  on the same day). 11/30/18 24HR UPEP revealed M spike at 75.1%. 11/28/18 SPEP revealed M spike of 4.6g. 11/28/18 Immunoglobulins revealed IgG elevated at '6181mg'$ .  12/01/18 BM Bx revealed hypercellular bone marrow with 70-80% CD138 immunohistochemistry (44% aspirate) lambda-restricted plasma cells as well as a kappa restricted population of B-cells 11/27/18 MRI Thoracic Spine which revealed Multifocal marrow signal abnormality consistent with metastatic disease or multiple myeloma. The patient has multiple compression fractures most consistent with pathologic injuries. Extensive marrow signal abnormality makes determining age of the fractures difficult but edema is most intense in T3. 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 She does not currently have elevated lymphocyte counts on peripheral  blood draws.  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 Day 1, 4, 8, and 11. Pt will take Dexamethasone at home on D15. -I have attempted to contact her glaucoma specialist, Dr. Edilia Bo, regarding the standard treatment recommendation of '40mg'$  Dexamethasone weekly -Will begin Fentanyl patch 21mg/h -Oxycodone 1-2 pills every 4-6 hours -One time use  of 1599mMagnesium citrate, repeat in 4 hours if no bowel movement is achieved, and could begin milk of magnesia as well -Continue two tablets of Senna S at night time and daily Miralax, backing off if diarrhea develops or OTC Fleet enema -Will begin Zometa vs Xgeva, pt denies current dental concerns -Did refer the pt to IR for consideration of a vertebroplasty -Intention to add Revlimid during C2 if initial treatment tolerance is displayed -Pt has never had a blood clot, and will begin '81mg'$  aspirin -Offered to refer the pt to an at-home nursing evaluation through [facility] which she prefers. -Will begin prophylactic Acyclovir BID as well -Will see the pt back in one week   Please followup as per scheduled appointments for C1 of treatment Continue Xgeva q4 weeks   All of the patients questions were answered with apparent satisfaction. The patient knows to call the clinic with any problems, questions or concerns.  The total time spent in the appt was 30 minutes and more than 50% was on counseling and direct patient cares.    GaSullivan LoneD MS AAHIVMS [facility] Associationematology/Oncology Physician CoThe Center For Orthopaedic Surgery(Office):       [phone] cell):  [phone]):           [phone]/17/2020 3:04 PM  I, ScBaldwin Jamaicaam acting as a scribe for Dr. GaSullivan Lone  .I have reviewed the above documentation for accuracy and completeness, and I agree with the above. GaBrunetta GeneraD

## 2018-12-20 ENCOUNTER — Other Ambulatory Visit: Payer: Self-pay

## 2018-12-20 ENCOUNTER — Inpatient Hospital Stay: Payer: Medicare HMO

## 2018-12-20 ENCOUNTER — Telehealth: Payer: Self-pay | Admitting: Hematology

## 2018-12-20 ENCOUNTER — Encounter: Payer: Self-pay | Admitting: Hematology

## 2018-12-20 ENCOUNTER — Inpatient Hospital Stay (HOSPITAL_BASED_OUTPATIENT_CLINIC_OR_DEPARTMENT_OTHER): Payer: Medicare HMO | Admitting: Hematology

## 2018-12-20 VITALS — BP 155/85 | HR 86 | Temp 98.0°F | Resp 18 | Ht 62.5 in | Wt 109.1 lb

## 2018-12-20 DIAGNOSIS — Z87891 Personal history of nicotine dependence: Secondary | ICD-10-CM | POA: Diagnosis not present

## 2018-12-20 DIAGNOSIS — G893 Neoplasm related pain (acute) (chronic): Secondary | ICD-10-CM | POA: Diagnosis not present

## 2018-12-20 DIAGNOSIS — Z801 Family history of malignant neoplasm of trachea, bronchus and lung: Secondary | ICD-10-CM

## 2018-12-20 DIAGNOSIS — C9000 Multiple myeloma not having achieved remission: Secondary | ICD-10-CM | POA: Diagnosis not present

## 2018-12-20 DIAGNOSIS — D649 Anemia, unspecified: Secondary | ICD-10-CM

## 2018-12-20 DIAGNOSIS — Z5111 Encounter for antineoplastic chemotherapy: Secondary | ICD-10-CM

## 2018-12-20 DIAGNOSIS — M8580 Other specified disorders of bone density and structure, unspecified site: Secondary | ICD-10-CM

## 2018-12-20 DIAGNOSIS — K5900 Constipation, unspecified: Secondary | ICD-10-CM | POA: Diagnosis not present

## 2018-12-20 DIAGNOSIS — Z79891 Long term (current) use of opiate analgesic: Secondary | ICD-10-CM

## 2018-12-20 DIAGNOSIS — Z79899 Other long term (current) drug therapy: Secondary | ICD-10-CM | POA: Diagnosis not present

## 2018-12-20 DIAGNOSIS — Z7189 Other specified counseling: Secondary | ICD-10-CM

## 2018-12-20 DIAGNOSIS — M7989 Other specified soft tissue disorders: Secondary | ICD-10-CM

## 2018-12-20 DIAGNOSIS — Z7982 Long term (current) use of aspirin: Secondary | ICD-10-CM | POA: Diagnosis not present

## 2018-12-20 DIAGNOSIS — I10 Essential (primary) hypertension: Secondary | ICD-10-CM | POA: Diagnosis not present

## 2018-12-20 LAB — CMP (CANCER CENTER ONLY)
ALT: 12 U/L (ref 0–44)
AST: 10 U/L — ABNORMAL LOW (ref 15–41)
Albumin: 2.3 g/dL — ABNORMAL LOW (ref 3.5–5.0)
Alkaline Phosphatase: 57 U/L (ref 38–126)
Anion gap: 11 (ref 5–15)
BUN: 17 mg/dL (ref 8–23)
CHLORIDE: 103 mmol/L (ref 98–111)
CO2: 24 mmol/L (ref 22–32)
CREATININE: 0.84 mg/dL (ref 0.44–1.00)
Calcium: 8.8 mg/dL — ABNORMAL LOW (ref 8.9–10.3)
GFR, Est AFR Am: >60 mL/min (ref >60)
Glucose, Bld: 118 mg/dL — ABNORMAL HIGH (ref 70–99)
Potassium: 3.7 mmol/L (ref 3.5–5.1)
Sodium: 138 mmol/L (ref 135–145)
Total Bilirubin: 0.5 mg/dL (ref 0.3–1.2)
Total Protein: 10.6 g/dL — ABNORMAL HIGH (ref 6.5–8.1)

## 2018-12-20 LAB — CBC WITH DIFFERENTIAL/PLATELET
Abs Immature Granulocytes: 0.01 10*3/uL (ref 0.00–0.07)
Basophils Absolute: 0 10*3/uL (ref 0.0–0.1)
Basophils Relative: 0 %
Eosinophils Absolute: 0.1 10*3/uL (ref 0.0–0.5)
Eosinophils Relative: 1 %
HCT: 28.8 % — ABNORMAL LOW (ref 36.0–46.0)
Hemoglobin: 9.1 g/dL — ABNORMAL LOW (ref 12.0–15.0)
Immature Granulocytes: 0 %
Lymphocytes Relative: 22 %
Lymphs Abs: 1.1 10*3/uL (ref 0.7–4.0)
MCH: 33 pg (ref 26.0–34.0)
MCHC: 31.6 g/dL (ref 30.0–36.0)
MCV: 104.3 fL — ABNORMAL HIGH (ref 80.0–100.0)
Monocytes Absolute: 0.6 10*3/uL (ref 0.1–1.0)
Monocytes Relative: 11 %
Neutro Abs: 3.5 10*3/uL (ref 1.7–7.7)
Neutrophils Relative %: 66 %
Platelets: 338 10*3/uL (ref 150–400)
RBC: 2.76 MIL/uL — ABNORMAL LOW (ref 3.87–5.11)
RDW: 15.1 % (ref 11.5–15.5)
WBC: 5.2 10*3/uL (ref 4.0–10.5)
nRBC: 0 % (ref 0.0–0.2)

## 2018-12-20 LAB — SEDIMENTATION RATE: Sed Rate: >140 mm/hr — ABNORMAL HIGH (ref 0–22)

## 2018-12-20 MED ORDER — OXYCODONE-ACETAMINOPHEN 5-325 MG PO TABS: 1.0000 | ORAL_TABLET | ORAL | 0 refills | Status: DC | PRN

## 2018-12-20 MED ORDER — MAGNESIUM CITRATE PO SOLN: 0.5000 | Freq: Once | ORAL | 1 refills | Status: AC

## 2018-12-20 MED ORDER — PROCHLORPERAZINE MALEATE 10 MG PO TABS
ORAL_TABLET | ORAL | Status: AC
  Filled 2018-12-20: qty 1

## 2018-12-20 MED ORDER — DEXAMETHASONE 4 MG PO TABS
ORAL_TABLET | ORAL | Status: AC
  Filled 2018-12-20: qty 5

## 2018-12-20 MED ORDER — DENOSUMAB 120 MG/1.7ML ~~LOC~~ SOLN
120.0000 mg | Freq: Once | SUBCUTANEOUS | Status: AC
  Administered 2018-12-20: 120 mg via SUBCUTANEOUS

## 2018-12-20 MED ORDER — DEXAMETHASONE 4 MG PO TABS
20.0000 mg | ORAL_TABLET | Freq: Once | ORAL | Status: AC
  Administered 2018-12-20: 20 mg via ORAL

## 2018-12-20 MED ORDER — PROCHLORPERAZINE MALEATE 10 MG PO TABS
10.0000 mg | ORAL_TABLET | Freq: Once | ORAL | Status: AC
  Administered 2018-12-20: 10 mg via ORAL

## 2018-12-20 MED ORDER — FENTANYL 12 MCG/HR TD PT72: 1.0000 | MEDICATED_PATCH | TRANSDERMAL | 0 refills | Status: DC

## 2018-12-20 MED ORDER — DEXAMETHASONE 4 MG PO TABS: ORAL_TABLET | ORAL | 3 refills | Status: DC

## 2018-12-20 MED ORDER — BORTEZOMIB CHEMO SQ INJECTION 3.5 MG (2.5MG/ML)
1.3000 mg/m2 | Freq: Once | INTRAMUSCULAR | Status: AC
  Administered 2018-12-20: 2 mg via SUBCUTANEOUS
  Filled 2018-12-20: qty 0.8

## 2018-12-20 MED ORDER — DENOSUMAB 120 MG/1.7ML ~~LOC~~ SOLN
SUBCUTANEOUS | Status: AC
  Filled 2018-12-20: qty 1.7

## 2018-12-20 NOTE — Progress Notes (Signed)
Called pt on 12/16/18 to introduce myself as her Arboriculturist and to discuss copay assistance.  Pt gave me consent to apply in her behalf so I applied to [employer] and she was approved to receive assistance up to $11,000 to assist w/ ins premiums and/orqualifying treatment expenseseffective 3/1/20to 12/03/19.  Pt is overqualified for the [employer].  She has my card for any questions or concerns she may have in the future.

## 2018-12-20 NOTE — Patient Instructions (Signed)
[facility] Discharge Instructions for Patients Receiving Chemotherapy  Today you received the following chemotherapy agents: Bortezomib (Velcade)  To help prevent nausea and vomiting after your treatment, we encourage you to take your nausea medication as directed.    If you develop nausea and vomiting that is not controlled by your nausea medication, call the clinic.   BELOW ARE SYMPTOMS THAT SHOULD BE REPORTED IMMEDIATELY:  *FEVER GREATER THAN 100.5 F  *CHILLS WITH OR WITHOUT FEVER  NAUSEA AND VOMITING THAT IS NOT CONTROLLED WITH YOUR NAUSEA MEDICATION  *UNUSUAL SHORTNESS OF BREATH  *UNUSUAL BRUISING OR BLEEDING  TENDERNESS IN MOUTH AND THROAT WITH OR WITHOUT PRESENCE OF ULCERS  *URINARY PROBLEMS  *BOWEL PROBLEMS  UNUSUAL RASH Items with * indicate a potential emergency and should be followed up as soon as possible.  Feel free to call the clinic should you have any questions or concerns. The clinic phone number is [phone].  Please show the CHEMO ALERT CARD at check-in to the Emergency Department and triage nurse.  Denosumab injection What is this medicine? DENOSUMAB (den oh sue mab) slows bone breakdown. Prolia is used to treat osteoporosis in women after menopause and in men, and in people who are taking corticosteroids for 6 months or more. Xgeva is used to treat a high calcium level due to cancer and to prevent bone fractures and other bone problems caused by multiple myeloma or cancer bone metastases. Xgeva is also used to treat giant cell tumor of the bone. This medicine may be used for other purposes; ask your health care provider or pharmacist if you have questions. COMMON BRAND NAME(S): Prolia, XGEVA What should I tell my health care provider before I take this medicine? They need to know if you have any of these conditions: -dental disease -having surgery or tooth extraction -infection -kidney disease -low levels of calcium or Vitamin D in  the blood -malnutrition -on hemodialysis -skin conditions or sensitivity -thyroid or parathyroid disease -an unusual reaction to denosumab, other medicines, foods, dyes, or preservatives -pregnant or trying to get pregnant -breast-feeding How should I use this medicine? This medicine is for injection under the skin. It is given by a health care professional in a hospital or clinic setting. A special MedGuide will be given to you before each treatment. Be sure to read this information carefully each time. For Prolia, talk to your pediatrician regarding the use of this medicine in children. Special care may be needed. For Xgeva, talk to your pediatrician regarding the use of this medicine in children. While this drug may be prescribed for children as young as 13 years for selected conditions, precautions do apply. Overdosage: If you think you have taken too much of this medicine contact a poison control center or emergency room at once. NOTE: This medicine is only for you. Do not share this medicine with others. What if I miss a dose? It is important not to miss your dose. Call your doctor or health care professional if you are unable to keep an appointment. What may interact with this medicine? Do not take this medicine with any of the following medications: -other medicines containing denosumab This medicine may also interact with the following medications: -medicines that lower your chance of fighting infection -steroid medicines like prednisone or cortisone This list may not describe all possible interactions. Give your health care provider a list of all the medicines, herbs, non-prescription drugs, or dietary supplements you use. Also tell them if you smoke, drink   alcohol, or use illegal drugs. Some items may interact with your medicine. What should I watch for while using this medicine? Visit your doctor or health care professional for regular checks on your progress. Your doctor or  health care professional may order blood tests and other tests to see how you are doing. Call your doctor or health care professional for advice if you get a fever, chills or sore throat, or other symptoms of a cold or flu. Do not treat yourself. This drug may decrease your body's ability to fight infection. Try to avoid being around people who are sick. You should make sure you get enough calcium and vitamin D while you are taking this medicine, unless your doctor tells you not to. Discuss the foods you eat and the vitamins you take with your health care professional. See your dentist regularly. Brush and floss your teeth as directed. Before you have any dental work done, tell your dentist you are receiving this medicine. Do not become pregnant while taking this medicine or for 5 months after stopping it. Talk with your doctor or health care professional about your birth control options while taking this medicine. Women should inform their doctor if they wish to become pregnant or think they might be pregnant. There is a potential for serious side effects to an unborn child. Talk to your health care professional or pharmacist for more information. What side effects may I notice from receiving this medicine? Side effects that you should report to your doctor or health care professional as soon as possible: -allergic reactions like skin rash, itching or hives, swelling of the face, lips, or tongue -bone pain -breathing problems -dizziness -jaw pain, especially after dental work -redness, blistering, peeling of the skin -signs and symptoms of infection like fever or chills; cough; sore throat; pain or trouble passing urine -signs of low calcium like fast heartbeat, muscle cramps or muscle pain; pain, tingling, numbness in the hands or feet; seizures -unusual bleeding or bruising -unusually weak or tired Side effects that usually do not require medical attention (report to your doctor or health care  professional if they continue or are bothersome): -constipation -diarrhea -headache -joint pain -loss of appetite -muscle pain -runny nose -tiredness -upset stomach This list may not describe all possible side effects. Call your doctor for medical advice about side effects. You may report side effects to FDA at 1-800-FDA-1088. Where should I keep my medicine? This medicine is only given in a clinic, doctor's office, or other health care setting and will not be stored at home. NOTE: This sheet is a summary. It may not cover all possible information. If you have questions about this medicine, talk to your doctor, pharmacist, or health care provider.  2019 Elsevier/Gold Standard (2018-01-28 16:10:44)  

## 2018-12-20 NOTE — Telephone Encounter (Signed)
Per 3/17 los, appts already scheduled. °

## 2018-12-21 ENCOUNTER — Ambulatory Visit (HOSPITAL_COMMUNITY): Payer: Medicare HMO

## 2018-12-21 ENCOUNTER — Telehealth: Payer: Self-pay | Admitting: *Deleted

## 2018-12-21 LAB — MULTIPLE MYELOMA PANEL, SERUM
ALBUMIN SERPL ELPH-MCNC: 3.3 g/dL (ref 2.9–4.4)
ALPHA 1: 0.4 g/dL (ref 0.0–0.4)
Albumin/Glob SerPl: 0.5 — ABNORMAL LOW (ref 0.7–1.7)
Alpha2 Glob SerPl Elph-Mcnc: 1.2 g/dL — ABNORMAL HIGH (ref 0.4–1.0)
B-Globulin SerPl Elph-Mcnc: 4.9 g/dL — ABNORMAL HIGH (ref 0.7–1.3)
Gamma Glob SerPl Elph-Mcnc: 0.1 g/dL — ABNORMAL LOW (ref 0.4–1.8)
Globulin, Total: 6.7 g/dL — ABNORMAL HIGH (ref 2.2–3.9)
IGG (IMMUNOGLOBIN G), SERUM: 6189 mg/dL — AB (ref 700–1600)
IgA: 22 mg/dL — ABNORMAL LOW (ref 64–422)
IgM (Immunoglobulin M), Srm: 15 mg/dL — ABNORMAL LOW (ref 26–217)
M Protein SerPl Elph-Mcnc: 4.3 g/dL — ABNORMAL HIGH
Total Protein ELP: 10 g/dL — ABNORMAL HIGH (ref 6.0–8.5)

## 2018-12-21 LAB — KAPPA/LAMBDA LIGHT CHAINS
Kappa free light chain: 7.6 mg/L (ref 3.3–19.4)
Kappa, lambda light chain ratio: 0 — ABNORMAL LOW (ref 0.26–1.65)
LAMDA FREE LIGHT CHAINS: 2026.1 mg/L — AB (ref 5.7–26.3)

## 2018-12-21 LAB — VITAMIN D 25 HYDROXY (VIT D DEFICIENCY, FRACTURES): Vit D, 25-Hydroxy: 64.2 ng/mL (ref 30.0–100.0)

## 2018-12-21 NOTE — Telephone Encounter (Signed)
Contacted patient to check on them after first chemotherapy. Patient reports feeling well, no temp or nausea. Still experiencing pain, taking percocet 5-325 every 4-5 hours. Reviewed taking dexamethasone on day 15 of cycle. Pharmacy will get Fentanyl patch in tomorrow to fill prescription, patient to begin as soon as obtained.  Advised to stay hydrated. Had BM today, encouraged to continue taking Miralax and senna-s as prescribed. Encouraged to contact office for further questions or concerns. Patient verbalized understanding.

## 2018-12-22 ENCOUNTER — Other Ambulatory Visit: Payer: Self-pay

## 2018-12-22 ENCOUNTER — Ambulatory Visit (HOSPITAL_COMMUNITY)
Admission: RE | Admit: 2018-12-22 | Discharge: 2018-12-22 | Disposition: A | Discharge status: Home / Self Care | Payer: Medicare HMO | Source: Ambulatory Visit | Attending: Hematology | Admitting: Hematology

## 2018-12-22 DIAGNOSIS — C9000 Multiple myeloma not having achieved remission: Secondary | ICD-10-CM | POA: Insufficient documentation

## 2018-12-22 DIAGNOSIS — Z79899 Other long term (current) drug therapy: Secondary | ICD-10-CM | POA: Insufficient documentation

## 2018-12-22 LAB — GLUCOSE, CAPILLARY: Glucose-Capillary: 126 mg/dL — ABNORMAL HIGH (ref 70–99)

## 2018-12-22 MED ORDER — FLUDEOXYGLUCOSE F - 18 (FDG) INJECTION
6.1000 | Freq: Once | INTRAVENOUS | Status: AC | PRN
  Administered 2018-12-22: 6.1 via INTRAVENOUS

## 2018-12-23 ENCOUNTER — Inpatient Hospital Stay: Payer: Medicare HMO

## 2018-12-23 ENCOUNTER — Telehealth: Payer: Self-pay | Admitting: *Deleted

## 2018-12-23 VITALS — BP 133/72 | HR 74 | Temp 98.3°F | Resp 16

## 2018-12-23 DIAGNOSIS — R609 Edema, unspecified: Secondary | ICD-10-CM | POA: Diagnosis not present

## 2018-12-23 DIAGNOSIS — Z7189 Other specified counseling: Secondary | ICD-10-CM

## 2018-12-23 DIAGNOSIS — M8458XD Pathological fracture in neoplastic disease, other specified site, subsequent encounter for fracture with routine healing: Secondary | ICD-10-CM | POA: Diagnosis not present

## 2018-12-23 DIAGNOSIS — I10 Essential (primary) hypertension: Secondary | ICD-10-CM | POA: Diagnosis not present

## 2018-12-23 DIAGNOSIS — M7989 Other specified soft tissue disorders: Secondary | ICD-10-CM | POA: Diagnosis not present

## 2018-12-23 DIAGNOSIS — D539 Nutritional anemia, unspecified: Secondary | ICD-10-CM | POA: Diagnosis not present

## 2018-12-23 DIAGNOSIS — Z5111 Encounter for antineoplastic chemotherapy: Secondary | ICD-10-CM | POA: Diagnosis not present

## 2018-12-23 DIAGNOSIS — Z7952 Long term (current) use of systemic steroids: Secondary | ICD-10-CM | POA: Diagnosis not present

## 2018-12-23 DIAGNOSIS — G893 Neoplasm related pain (acute) (chronic): Secondary | ICD-10-CM | POA: Diagnosis not present

## 2018-12-23 DIAGNOSIS — H409 Unspecified glaucoma: Secondary | ICD-10-CM | POA: Diagnosis not present

## 2018-12-23 DIAGNOSIS — D72820 Lymphocytosis (symptomatic): Secondary | ICD-10-CM | POA: Diagnosis not present

## 2018-12-23 DIAGNOSIS — Z7982 Long term (current) use of aspirin: Secondary | ICD-10-CM | POA: Diagnosis not present

## 2018-12-23 DIAGNOSIS — M8580 Other specified disorders of bone density and structure, unspecified site: Secondary | ICD-10-CM | POA: Diagnosis not present

## 2018-12-23 DIAGNOSIS — K5900 Constipation, unspecified: Secondary | ICD-10-CM | POA: Diagnosis not present

## 2018-12-23 DIAGNOSIS — C9000 Multiple myeloma not having achieved remission: Secondary | ICD-10-CM

## 2018-12-23 DIAGNOSIS — Z87891 Personal history of nicotine dependence: Secondary | ICD-10-CM | POA: Diagnosis not present

## 2018-12-23 DIAGNOSIS — Z79899 Other long term (current) drug therapy: Secondary | ICD-10-CM | POA: Diagnosis not present

## 2018-12-23 MED ORDER — BORTEZOMIB CHEMO SQ INJECTION 3.5 MG (2.5MG/ML)
1.3000 mg/m2 | Freq: Once | INTRAMUSCULAR | Status: AC
  Administered 2018-12-23: 2 mg via SUBCUTANEOUS
  Filled 2018-12-23: qty 0.8

## 2018-12-23 MED ORDER — PROCHLORPERAZINE MALEATE 10 MG PO TABS
10.0000 mg | ORAL_TABLET | Freq: Once | ORAL | Status: AC
  Administered 2018-12-23: 10 mg via ORAL

## 2018-12-23 MED ORDER — PROCHLORPERAZINE MALEATE 10 MG PO TABS
ORAL_TABLET | ORAL | Status: AC
  Filled 2018-12-23: qty 1

## 2018-12-23 NOTE — Patient Instructions (Signed)
[facility] Discharge Instructions for Patients Receiving Chemotherapy  Today you received the following chemotherapy agents Velcade To help prevent nausea and vomiting after your treatment, we encourage you to take your nausea medication as prescribed.   If you develop nausea and vomiting that is not controlled by your nausea medication, call the clinic.   BELOW ARE SYMPTOMS THAT SHOULD BE REPORTED IMMEDIATELY:  *FEVER GREATER THAN 100.5 F  *CHILLS WITH OR WITHOUT FEVER  NAUSEA AND VOMITING THAT IS NOT CONTROLLED WITH YOUR NAUSEA MEDICATION  *UNUSUAL SHORTNESS OF BREATH  *UNUSUAL BRUISING OR BLEEDING  TENDERNESS IN MOUTH AND THROAT WITH OR WITHOUT PRESENCE OF ULCERS  *URINARY PROBLEMS  *BOWEL PROBLEMS  UNUSUAL RASH Items with * indicate a potential emergency and should be followed up as soon as possible.  Feel free to call the clinic should you have any questions or concerns. The clinic phone number is [phone].  Please show the CHEMO ALERT CARD at check-in to the Emergency Department and triage nurse.   

## 2018-12-23 NOTE — Telephone Encounter (Signed)
Contacted Edwinna Areola w/[facility]  [phone] to provide information for patient's referral for home PT and nursing care. Santiago Glad will f/u with patient.

## 2018-12-24 DIAGNOSIS — R609 Edema, unspecified: Secondary | ICD-10-CM | POA: Diagnosis not present

## 2018-12-24 DIAGNOSIS — H409 Unspecified glaucoma: Secondary | ICD-10-CM | POA: Diagnosis not present

## 2018-12-24 DIAGNOSIS — M8580 Other specified disorders of bone density and structure, unspecified site: Secondary | ICD-10-CM | POA: Diagnosis not present

## 2018-12-24 DIAGNOSIS — Z7952 Long term (current) use of systemic steroids: Secondary | ICD-10-CM | POA: Diagnosis not present

## 2018-12-24 DIAGNOSIS — G893 Neoplasm related pain (acute) (chronic): Secondary | ICD-10-CM | POA: Diagnosis not present

## 2018-12-24 DIAGNOSIS — D539 Nutritional anemia, unspecified: Secondary | ICD-10-CM | POA: Diagnosis not present

## 2018-12-24 DIAGNOSIS — M8458XD Pathological fracture in neoplastic disease, other specified site, subsequent encounter for fracture with routine healing: Secondary | ICD-10-CM | POA: Diagnosis not present

## 2018-12-24 DIAGNOSIS — D72820 Lymphocytosis (symptomatic): Secondary | ICD-10-CM | POA: Diagnosis not present

## 2018-12-24 DIAGNOSIS — C9000 Multiple myeloma not having achieved remission: Secondary | ICD-10-CM | POA: Diagnosis not present

## 2018-12-24 DIAGNOSIS — I10 Essential (primary) hypertension: Secondary | ICD-10-CM | POA: Diagnosis not present

## 2018-12-26 ENCOUNTER — Other Ambulatory Visit: Payer: Self-pay | Admitting: *Deleted

## 2018-12-26 ENCOUNTER — Telehealth: Payer: Self-pay | Admitting: *Deleted

## 2018-12-26 DIAGNOSIS — C9000 Multiple myeloma not having achieved remission: Secondary | ICD-10-CM

## 2018-12-26 NOTE — Telephone Encounter (Signed)
Patient reporteded rash over thighs, down to knee. Asked if ok to use (OTC) hydrocortisone creme advised by infusion nurse to use on injection sites. States ankles still swollen and lasix is completed.  Per Dr. Irene Limbo  - Stop Revlimid. Start OTC antihistamine Zyrtec or Claritin, 10 mg q day. Start Famotidine 20 mg q day. Can use hydrocortisone creme on legs if rash continues with antihistamine. Advised to continue elevation of legs while seated.  Patient verbalized understanding of all directions. Will contact office for questions or concerns.

## 2018-12-27 ENCOUNTER — Other Ambulatory Visit: Payer: Self-pay

## 2018-12-27 ENCOUNTER — Inpatient Hospital Stay: Payer: Medicare HMO

## 2018-12-27 ENCOUNTER — Telehealth: Payer: Self-pay | Admitting: *Deleted

## 2018-12-27 VITALS — BP 145/84 | HR 77 | Temp 98.3°F | Resp 18

## 2018-12-27 DIAGNOSIS — I10 Essential (primary) hypertension: Secondary | ICD-10-CM | POA: Diagnosis not present

## 2018-12-27 DIAGNOSIS — Z7982 Long term (current) use of aspirin: Secondary | ICD-10-CM | POA: Diagnosis not present

## 2018-12-27 DIAGNOSIS — Z7189 Other specified counseling: Secondary | ICD-10-CM

## 2018-12-27 DIAGNOSIS — C9000 Multiple myeloma not having achieved remission: Secondary | ICD-10-CM

## 2018-12-27 DIAGNOSIS — M8580 Other specified disorders of bone density and structure, unspecified site: Secondary | ICD-10-CM | POA: Diagnosis not present

## 2018-12-27 DIAGNOSIS — G893 Neoplasm related pain (acute) (chronic): Secondary | ICD-10-CM | POA: Diagnosis not present

## 2018-12-27 DIAGNOSIS — Z79899 Other long term (current) drug therapy: Secondary | ICD-10-CM | POA: Diagnosis not present

## 2018-12-27 DIAGNOSIS — M7989 Other specified soft tissue disorders: Secondary | ICD-10-CM | POA: Diagnosis not present

## 2018-12-27 DIAGNOSIS — K5900 Constipation, unspecified: Secondary | ICD-10-CM | POA: Diagnosis not present

## 2018-12-27 DIAGNOSIS — Z5111 Encounter for antineoplastic chemotherapy: Secondary | ICD-10-CM | POA: Diagnosis not present

## 2018-12-27 DIAGNOSIS — Z87891 Personal history of nicotine dependence: Secondary | ICD-10-CM | POA: Diagnosis not present

## 2018-12-27 LAB — CBC WITH DIFFERENTIAL (CANCER CENTER ONLY)
Abs Immature Granulocytes: 0.03 10*3/uL (ref 0.00–0.07)
Basophils Absolute: 0 10*3/uL (ref 0.0–0.1)
Basophils Relative: 0 %
Eosinophils Absolute: 0.2 10*3/uL (ref 0.0–0.5)
Eosinophils Relative: 3 %
HCT: 28.6 % — ABNORMAL LOW (ref 36.0–46.0)
HEMOGLOBIN: 9.7 g/dL — AB (ref 12.0–15.0)
Immature Granulocytes: 1 %
Lymphocytes Relative: 11 %
Lymphs Abs: 0.6 10*3/uL — ABNORMAL LOW (ref 0.7–4.0)
MCH: 35 pg — ABNORMAL HIGH (ref 26.0–34.0)
MCHC: 33.9 g/dL (ref 30.0–36.0)
MCV: 103.2 fL — ABNORMAL HIGH (ref 80.0–100.0)
Monocytes Absolute: 0.5 10*3/uL (ref 0.1–1.0)
Monocytes Relative: 9 %
Neutro Abs: 4.5 10*3/uL (ref 1.7–7.7)
Neutrophils Relative %: 76 %
Platelet Count: 293 10*3/uL (ref 150–400)
RBC: 2.77 MIL/uL — AB (ref 3.87–5.11)
RDW: 15.1 % (ref 11.5–15.5)
WBC Count: 5.9 10*3/uL (ref 4.0–10.5)
nRBC: 0 % (ref 0.0–0.2)

## 2018-12-27 LAB — CMP (CANCER CENTER ONLY)
ALT: 10 U/L (ref 0–44)
AST: 8 U/L — ABNORMAL LOW (ref 15–41)
Albumin: 2.1 g/dL — ABNORMAL LOW (ref 3.5–5.0)
Alkaline Phosphatase: 61 U/L (ref 38–126)
Anion gap: 9 (ref 5–15)
BUN: 18 mg/dL (ref 8–23)
CO2: 24 mmol/L (ref 22–32)
Calcium: 7.8 mg/dL — ABNORMAL LOW (ref 8.9–10.3)
Chloride: 102 mmol/L (ref 98–111)
Creatinine: 0.69 mg/dL (ref 0.44–1.00)
GFR, Est AFR Am: >60 mL/min (ref >60)
GFR, Estimated: >60 mL/min (ref >60)
Glucose, Bld: 117 mg/dL — ABNORMAL HIGH (ref 70–99)
Potassium: 4.5 mmol/L (ref 3.5–5.1)
SODIUM: 135 mmol/L (ref 135–145)
Total Bilirubin: 0.6 mg/dL (ref 0.3–1.2)
Total Protein: 8.7 g/dL — ABNORMAL HIGH (ref 6.5–8.1)

## 2018-12-27 MED ORDER — BORTEZOMIB CHEMO SQ INJECTION 3.5 MG (2.5MG/ML)
1.3000 mg/m2 | Freq: Once | INTRAMUSCULAR | Status: AC
  Administered 2018-12-27: 2 mg via SUBCUTANEOUS
  Filled 2018-12-27: qty 0.8

## 2018-12-27 MED ORDER — DEXAMETHASONE 4 MG PO TABS
ORAL_TABLET | ORAL | Status: AC
  Filled 2018-12-27: qty 5

## 2018-12-27 MED ORDER — PROCHLORPERAZINE MALEATE 10 MG PO TABS
ORAL_TABLET | ORAL | Status: AC
  Filled 2018-12-27: qty 1

## 2018-12-27 MED ORDER — PROCHLORPERAZINE MALEATE 10 MG PO TABS
10.0000 mg | ORAL_TABLET | Freq: Once | ORAL | Status: AC
  Administered 2018-12-27: 10 mg via ORAL

## 2018-12-27 MED ORDER — DEXAMETHASONE 4 MG PO TABS
20.0000 mg | ORAL_TABLET | Freq: Once | ORAL | Status: AC
  Administered 2018-12-27: 20 mg via ORAL

## 2018-12-27 NOTE — Patient Instructions (Signed)
Coronavirus (COVID-19) Are you at risk?  Are you at risk for the Coronavirus (COVID-19)?  To be considered HIGH RISK for Coronavirus (COVID-19), you have to meet the following criteria:  . Traveled to [place], [place], [place], [place] or [place]; or in the [place] to [place], [place], [place], or [place]; and have fever, cough, and shortness of breath within the last 2 weeks of travel OR . Been in close contact with a person diagnosed with COVID-19 within the last 2 weeks and have fever, cough, and shortness of breath . IF YOU DO NOT MEET THESE CRITERIA, YOU ARE CONSIDERED LOW RISK FOR COVID-19.  What to do if you are HIGH RISK for COVID-19?  . If you are having a medical emergency, call 911. . Seek medical care right away. Before you go to a doctor's office, urgent care or emergency department, call ahead and tell them about your recent travel, contact with someone diagnosed with COVID-19, and your symptoms. You should receive instructions from your physician's office regarding next steps of care.  . When you arrive at healthcare provider, tell the healthcare staff immediately you have returned from visiting [place], [place], [place], [place] or [place]; or traveled in the [place] to [place], [place], [place], or [place]; in the last two weeks or you have been in close contact with a person diagnosed with COVID-19 in the last 2 weeks.   . Tell the health care staff about your symptoms: fever, cough and shortness of breath. . After you have been seen by a medical provider, you will be either: o Tested for (COVID-19) and discharged home on quarantine except to seek medical care if symptoms worsen, and asked to  - Stay home and avoid contact with others until you get your results (4-5 days)  - Avoid travel on public transportation if possible (such as bus, train, or airplane) or o Sent to the Emergency Department by EMS for evaluation, COVID-19 testing, and possible  admission depending on your condition and test results.  What to do if you are LOW RISK for COVID-19?  Reduce your risk of any infection by using the same precautions used for avoiding the common cold or flu:  . Wash your hands often with soap and warm water for at least 20 seconds.  If soap and water are not readily available, use an alcohol-based hand sanitizer with at least 60% alcohol.  . If coughing or sneezing, cover your mouth and nose by coughing or sneezing into the elbow areas of your shirt or coat, into a tissue or into your sleeve (not your hands). . Avoid shaking hands with others and consider head nods or verbal greetings only. . Avoid touching your eyes, nose, or mouth with unwashed hands.  . Avoid close contact with people who are sick. . Avoid places or events with large numbers of people in one location, like concerts or sporting events. . Carefully consider travel plans you have or are making. . If you are planning any travel outside or inside the [place], visit the CDC's Travelers' Health webpage for the latest health notices. . If you have some symptoms but not all symptoms, continue to monitor at home and seek medical attention if your symptoms worsen. . If you are having a medical emergency, call 911.   ADDITIONAL HEALTHCARE OPTIONS FOR PATIENTS  [facility] / e-Visit: [email or web address]         [facility]: [phone]  [facility]: [phone]                   [facility]: [phone]    [facility] Discharge Instructions for Patients Receiving Chemotherapy  Today you received the following chemotherapy agents Velcade  To help prevent nausea and vomiting after your treatment, we encourage you to take your nausea medication as directed   If you develop nausea and vomiting that is not controlled by your nausea medication, call the clinic.   BELOW ARE  SYMPTOMS THAT SHOULD BE REPORTED IMMEDIATELY:  *FEVER GREATER THAN 100.5 F  *CHILLS WITH OR WITHOUT FEVER  NAUSEA AND VOMITING THAT IS NOT CONTROLLED WITH YOUR NAUSEA MEDICATION  *UNUSUAL SHORTNESS OF BREATH  *UNUSUAL BRUISING OR BLEEDING  TENDERNESS IN MOUTH AND THROAT WITH OR WITHOUT PRESENCE OF ULCERS  *URINARY PROBLEMS  *BOWEL PROBLEMS  UNUSUAL RASH Items with * indicate a potential emergency and should be followed up as soon as possible.  Feel free to call the clinic should you have any questions or concerns. The clinic phone number is [phone].  Please show the CHEMO ALERT CARD at check-in to the Emergency Department and triage nurse.   

## 2018-12-27 NOTE — Telephone Encounter (Signed)
Signed PT orders faxed to [facility] [phone]. Fax confirmation received.

## 2018-12-28 ENCOUNTER — Telehealth: Payer: Self-pay | Admitting: *Deleted

## 2018-12-28 ENCOUNTER — Other Ambulatory Visit: Payer: Self-pay | Admitting: *Deleted

## 2018-12-28 DIAGNOSIS — Z7952 Long term (current) use of systemic steroids: Secondary | ICD-10-CM | POA: Diagnosis not present

## 2018-12-28 DIAGNOSIS — M8458XD Pathological fracture in neoplastic disease, other specified site, subsequent encounter for fracture with routine healing: Secondary | ICD-10-CM | POA: Diagnosis not present

## 2018-12-28 DIAGNOSIS — H409 Unspecified glaucoma: Secondary | ICD-10-CM | POA: Diagnosis not present

## 2018-12-28 DIAGNOSIS — C9000 Multiple myeloma not having achieved remission: Secondary | ICD-10-CM | POA: Diagnosis not present

## 2018-12-28 DIAGNOSIS — I10 Essential (primary) hypertension: Secondary | ICD-10-CM | POA: Diagnosis not present

## 2018-12-28 DIAGNOSIS — D539 Nutritional anemia, unspecified: Secondary | ICD-10-CM | POA: Diagnosis not present

## 2018-12-28 DIAGNOSIS — G893 Neoplasm related pain (acute) (chronic): Secondary | ICD-10-CM | POA: Diagnosis not present

## 2018-12-28 DIAGNOSIS — D72820 Lymphocytosis (symptomatic): Secondary | ICD-10-CM | POA: Diagnosis not present

## 2018-12-28 DIAGNOSIS — M8580 Other specified disorders of bone density and structure, unspecified site: Secondary | ICD-10-CM | POA: Diagnosis not present

## 2018-12-28 DIAGNOSIS — R609 Edema, unspecified: Secondary | ICD-10-CM | POA: Diagnosis not present

## 2018-12-28 MED ORDER — OXYCODONE-ACETAMINOPHEN 5-325 MG PO TABS: 1.0000 | ORAL_TABLET | ORAL | 0 refills | Status: DC | PRN

## 2018-12-28 NOTE — Telephone Encounter (Signed)
Dr. Irene Limbo signed [place]. Faxed to [facility] - fax confirmation received

## 2018-12-28 NOTE — Telephone Encounter (Signed)
Patient called, requested refill of Percocet this week.  States taking approx 6/day and has 10 left.

## 2018-12-28 NOTE — Telephone Encounter (Signed)
Contacted patient to ask if rash on thighs and abdomen is less today than yesterday. She reports less red, states it is around her back at her waist, and a little on her arms. Itching lightly. Is taking Pepcid 20 mg and Loratidine (Claritin) 10 mg each day and is applying hydrocortisone (OTC) creme to areas. States home health nurse was there today and assisted with applying creme. Informed her that Dr. Irene Limbo has offered to prescribe prednisone 20 mg daily/7days if patient's rash increases or if she experiences increased discomfort. Patient does not want to take prednisone at this time due to glaucoma. Information given to Dr. Irene Limbo.

## 2018-12-29 ENCOUNTER — Other Ambulatory Visit: Payer: Self-pay | Admitting: Hematology

## 2018-12-29 ENCOUNTER — Encounter: Payer: Self-pay | Admitting: *Deleted

## 2018-12-29 MED ORDER — OXYCODONE-ACETAMINOPHEN 5-325 MG PO TABS: 1.0000 | ORAL_TABLET | ORAL | 0 refills | Status: DC | PRN

## 2018-12-30 ENCOUNTER — Other Ambulatory Visit: Payer: Self-pay

## 2018-12-30 ENCOUNTER — Inpatient Hospital Stay: Payer: Medicare HMO

## 2018-12-30 VITALS — BP 138/80 | HR 72 | Temp 99.0°F | Resp 18

## 2018-12-30 DIAGNOSIS — Z79899 Other long term (current) drug therapy: Secondary | ICD-10-CM | POA: Diagnosis not present

## 2018-12-30 DIAGNOSIS — M7989 Other specified soft tissue disorders: Secondary | ICD-10-CM | POA: Diagnosis not present

## 2018-12-30 DIAGNOSIS — G893 Neoplasm related pain (acute) (chronic): Secondary | ICD-10-CM | POA: Diagnosis not present

## 2018-12-30 DIAGNOSIS — Z7982 Long term (current) use of aspirin: Secondary | ICD-10-CM | POA: Diagnosis not present

## 2018-12-30 DIAGNOSIS — Z87891 Personal history of nicotine dependence: Secondary | ICD-10-CM | POA: Diagnosis not present

## 2018-12-30 DIAGNOSIS — Z7189 Other specified counseling: Secondary | ICD-10-CM

## 2018-12-30 DIAGNOSIS — I10 Essential (primary) hypertension: Secondary | ICD-10-CM | POA: Diagnosis not present

## 2018-12-30 DIAGNOSIS — K5900 Constipation, unspecified: Secondary | ICD-10-CM | POA: Diagnosis not present

## 2018-12-30 DIAGNOSIS — Z5111 Encounter for antineoplastic chemotherapy: Secondary | ICD-10-CM | POA: Diagnosis not present

## 2018-12-30 DIAGNOSIS — M8580 Other specified disorders of bone density and structure, unspecified site: Secondary | ICD-10-CM | POA: Diagnosis not present

## 2018-12-30 DIAGNOSIS — C9000 Multiple myeloma not having achieved remission: Secondary | ICD-10-CM

## 2018-12-30 MED ORDER — PROCHLORPERAZINE MALEATE 10 MG PO TABS
ORAL_TABLET | ORAL | Status: AC
  Filled 2018-12-30: qty 1

## 2018-12-30 MED ORDER — BORTEZOMIB CHEMO SQ INJECTION 3.5 MG (2.5MG/ML)
1.3000 mg/m2 | Freq: Once | INTRAMUSCULAR | Status: AC
  Administered 2018-12-30: 2 mg via SUBCUTANEOUS
  Filled 2018-12-30: qty 0.8

## 2018-12-30 MED ORDER — PROCHLORPERAZINE MALEATE 10 MG PO TABS
10.0000 mg | ORAL_TABLET | Freq: Once | ORAL | Status: AC
  Administered 2018-12-30: 10 mg via ORAL

## 2019-01-02 ENCOUNTER — Other Ambulatory Visit: Payer: Self-pay | Admitting: *Deleted

## 2019-01-02 ENCOUNTER — Telehealth: Payer: Self-pay | Admitting: *Deleted

## 2019-01-02 ENCOUNTER — Other Ambulatory Visit: Payer: Self-pay | Admitting: Hematology

## 2019-01-02 MED ORDER — FENTANYL 12 MCG/HR TD PT72: 1.0000 | MEDICATED_PATCH | TRANSDERMAL | 0 refills | Status: DC

## 2019-01-02 NOTE — Telephone Encounter (Signed)
Patient requested refill of Fentanyl patches. 15 day supply ordered 3/17--states she is applying her last patch tomorrow and wanted to give pharmacy a head start.

## 2019-01-02 NOTE — Telephone Encounter (Signed)
Patient called with updates on 3 concerns: 1)Back spasms continue - essentially wrap around back. Pain med helps decrease from 10 to 9. 2)Feet and legs remain swollen, but she is elevating them as often as possible. 3) Rash is much better, only areas left are on back at waist. Informed her that Dr. Irene Limbo will receive updates. She verbalized understanding. She also requested refill of Fentanyl, refill request sent to Dr. Irene Limbo.

## 2019-01-05 ENCOUNTER — Other Ambulatory Visit: Payer: Self-pay | Admitting: Hematology

## 2019-01-05 DIAGNOSIS — D72820 Lymphocytosis (symptomatic): Secondary | ICD-10-CM | POA: Diagnosis not present

## 2019-01-05 DIAGNOSIS — D539 Nutritional anemia, unspecified: Secondary | ICD-10-CM | POA: Diagnosis not present

## 2019-01-05 DIAGNOSIS — H409 Unspecified glaucoma: Secondary | ICD-10-CM | POA: Diagnosis not present

## 2019-01-05 DIAGNOSIS — I10 Essential (primary) hypertension: Secondary | ICD-10-CM | POA: Diagnosis not present

## 2019-01-05 DIAGNOSIS — R609 Edema, unspecified: Secondary | ICD-10-CM | POA: Diagnosis not present

## 2019-01-05 DIAGNOSIS — M8458XD Pathological fracture in neoplastic disease, other specified site, subsequent encounter for fracture with routine healing: Secondary | ICD-10-CM | POA: Diagnosis not present

## 2019-01-05 DIAGNOSIS — G893 Neoplasm related pain (acute) (chronic): Secondary | ICD-10-CM | POA: Diagnosis not present

## 2019-01-05 DIAGNOSIS — M8580 Other specified disorders of bone density and structure, unspecified site: Secondary | ICD-10-CM | POA: Diagnosis not present

## 2019-01-05 DIAGNOSIS — Z7952 Long term (current) use of systemic steroids: Secondary | ICD-10-CM | POA: Diagnosis not present

## 2019-01-05 DIAGNOSIS — C9000 Multiple myeloma not having achieved remission: Secondary | ICD-10-CM | POA: Diagnosis not present

## 2019-01-05 MED ORDER — OXYCODONE-ACETAMINOPHEN 5-325 MG PO TABS: 1.0000 | ORAL_TABLET | ORAL | 0 refills | Status: DC | PRN

## 2019-01-09 DIAGNOSIS — H401112 Primary open-angle glaucoma, right eye, moderate stage: Secondary | ICD-10-CM | POA: Diagnosis not present

## 2019-01-09 DIAGNOSIS — H401122 Primary open-angle glaucoma, left eye, moderate stage: Secondary | ICD-10-CM | POA: Diagnosis not present

## 2019-01-09 NOTE — Progress Notes (Signed)
HEMATOLOGY/ONCOLOGY CONSULTATION NOTE  Date of Service: 01/10/2019  Patient Care Team: [name], MD as PCP - General (Family Medicine)  Dr. Ronney Lion as [place] Specialist  [phone]  CHIEF COMPLAINTS/PURPOSE OF CONSULTATION:  Multiple Myeloma  HISTORY OF PRESENTING ILLNESS:   Megan Olson is a wonderful 78 y.o. female who has been referred to [place] by Dr. 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 She is accompanied today by her husband. The pt reports that she is doing well overall.   The pt notes that she was doing extensive yard work in October 2019, and began to feel back pain a few days after this, which she attributed to muscle pain. She recalls taking a deep breath, and developing sudden acute pain, and presented to care with her PCP's office. She began exercises for her back pain, without relief, then began PT without relief, then was referred to Dr. Paulla Fore in sports medicine in late January 2020. She had an XR which revealed a compression fracture at T11, then subsequently had an MRI, and a bone marrow biopsy. The pt notes that her back pain "seemed to move around." She endorses pain "up the whole left side" of her back.  The pt notes that she is not able to stand up straight due to her back pain, which she feels limits her ability to take a deep breath, and endorses pain exacerbation when she does take a deep breath. The pt needs assistance from sitting to lying from her husband. She is using about 3 Percocet a day.  The pt notes worsened constipation since beginning Percocet, and notes that her most recent laxative order was not covered by her insurance. She is using prune juice and milk of magnesia. She took Senokot S BID without relief.  The pt reports that prior to her recent back pain, she had very few medical concerns. She endorses controlled BP, and has been monitored for DM but after closely  watching her diet her A1C decreased to 5.8. She has glaucoma, and has had surgery in both eyes. Right eye with stent. The pt sees Dr. Edilia Bo at [facility] for her eye care. The pt notes that her vision has been recently "pretty good." The pt notes that she has been advised to limit treatment with steroids for her glaucoma. She denies heart or lung problems. Denies previous back problems. Last DEXA scan 3 years ago, and endorses osteopenia. She notes that she took Fosamax for 3-4 years, and stopped 5-6 years ago. She takes Vitamin D, a multivitamin, and magnesium.  The pt notes that she quit smoking cigarettes when she was 27, started when she was about 20. The pt consumes alcohol rarely, but not since beginning narcotics. 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  Of note prior to the patient's visit today, pt has had an MRI Thoracic Spine completed on 11/27/18 with results revealing Multifocal marrow signal abnormality consistent with metastatic disease or multiple myeloma. The patient has multiple compression fractures most consistent with pathologic injuries. Extensive marrow signal abnormality makes determining age of the fractures difficult but edema is most intense in T3. 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  Most recent lab results (12/01/18) of CBC w/diff is as follows: all values are WNL except for RBC at 3.17, HGB at 10.4, HCT at 33.5, MCV at 105.7. 11/28/18 CMP  revealed all values WNL except for Glucose at 105, Total Protein at 10.2, Albumin at 3.1 11/30/18 24HR UPEP revealed all values WNL except for M-spike at 75.1%. 11/28/18 Bega-2 microglobulin slightly elevated at 2.6 11/28/18 SPEP revealed M spike at 4.6g 11/28/18 Immunoglobulins revealed IgG at 6181, IgA at 24, IgM at 16, and IgE at 6.  On review of systems, pt reports constipation, back pain, stable energy levels, ankle swelling, tenderness at T3,  lower back pain, and denies abdominal pains, neck pain, and any other symptoms.   On PMHx the pt reports redundant colon, single hemorrhoid, glaucoma, HTN, osteopenia, Tonsillectomy, right eye stent and multiple eye surgeries. On Social Hx the pt reports rare alcohol use, smoked cigarettes between ages 72 and 26. 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 On Family Hx the pt reports sister died from small cell lung cancer and was a lifelong smoker, brother's daughter died of breast cancer with BRCA1 and BRCA2 mutations. Cousin with female breast cancer.  Interval History:   Megan Olson returns today for management and evaluation of her newly diagnosed multiple myeloma. The patient's last visit with [place] was on 12/20/18. The pt reports that she is doing well overall.   In the interim the pt began C1 Velcade and Dexamethasone, and was also able to begin Revlimid. She took Revlimid for 6 days, and noticed that she developed an itchy rash on her legs, spread to arms, an some in her abdomen, about 4 days after beginning Revlimid.She denies mouth sores or genital rash. She then began pepcid, claritin and hydrocortisone cream. She notes that her rash has resolved after 4 days of beginning the antihistamines. She denies fevers, chills, or night sweats. She denies any problems taking the Velcade and dexamethasone.  The pt notes that in the past she had an allergic rash to penicillin and sulfa drugs. She notes that she became light headed after Diamox in the past. Cough with Ace inhibitors.  The pt reports that she has felt warmth in her face intermittently as well and denies cough, sore throat, diarrhea, nausea or concern for infection.   The pt notes that she has spurts of pain related to her compression fracture in her lower back, which are quite painful for her. She adds that when she has these spasms, her upper back begins to hurt as well. She saw IR for considerations of a vertebroplasty. The pt notes that she  takes 6 Percocet each day and has continued on the Fentanyl patch. She notes that she is moving her bowels much better.  She is elevating her feet and is trying to walk around her home much more. She notes that her leg swelling is "a little better," and always better in the morning than later in the day. She notes that she does not want to use compression socks because it is painful for her to put these on.   The pt also notes that she is sleeping better.  Of note since the patient's last visit, pt has had a PET/CT completed on 12/22/18 with results revealing Numerous hypermetabolic bone lesions throughout the axial and appendicular skeleton consistent with the history of multiple Myeloma. 2. Areas of hypermetabolic disease identified in both lungs suggesting multiple myeloma involvement. 3. 1.9 cm calcified left thyroid nodule is hypermetabolic, but Indeterminate. 4. Colon is diffusely distended with gas and stool. Imaging features would be compatible with clinical constipation. 5.  Aortic Atherosclerois.  Lab results today (01/10/19) of CBC w/diff and CMP is as follows:  all values are WNL except for WBC at 3.2k, RBC at 2.95, HGB at 9.7, HCT at 30.9, MCV at 104.7, RDW at 15.8, PLT at 547k, Glucose at 110, Calcium at 8.3, Albumin at 2.7, Alk Phos at 196. 01/10/19 MMP is pending  On review of systems, pt reports resolution of itchy rash, stable energy levels, lower back pain, sleeping better, and denies fevers, chills, night sweats, diarrhea, mouth sores, genial rashes, cough, sore throat, nausea, concern for infection, chest wall pain, and any other symptoms.   MEDICAL HISTORY:  Past Medical History:  Diagnosis Date   Cancer [facility])    multiple myeloma   Glaucoma    HYPERTENSION 03/11/2007   OSTEOPENIA 03/11/2007    SURGICAL HISTORY: Past Surgical History:  Procedure Laterality Date   BREAST EXCISIONAL BIOPSY Right 2000   BREAST LUMPECTOMY  1990   benign   DILATION AND CURETTAGE OF UTERUS      bleeding at menopause. No uterine cancer   IR RADIOLOGIST EVAL & MGMT  12/13/2018   TONSILLECTOMY     age 81    SOCIAL HISTORY: Social History   Socioeconomic History   Marital status: Married    Spouse name: Not on file   Number of children: Not on file   Years of education: Not on file   Highest education level: Not on file  Occupational History   Not on file  Social Needs   Financial resource strain: Not on file   Food insecurity:    Worry: Not on file    Inability: Not on file   Transportation needs:    Medical: Not on file    Non-medical: Not on file  Tobacco Use   Smoking status: Former Smoker    Packs/day: 0.50    Years: 7.00    Pack years: 3.50    Types: Cigarettes    Last attempt to quit: 01/04/1961    Years since quitting: 58.0   Smokeless tobacco: Never Used  Substance and Sexual Activity   Alcohol use: Yes    Alcohol/week: 1.0 standard drinks    Types: 1 Standard drinks or equivalent per week   Drug use: No   Sexual activity: Not Currently  Lifestyle   Physical activity:    Days per week: Not on file    Minutes per session: Not on file   Stress: Not on file  Relationships   Social connections:    Talks on phone: Not on file    Gets together: Not on file    Attends religious service: Not on file    Active member of club or organization: Not on file    Attends meetings of clubs or organizations: Not on file    Relationship status: Not on file   Intimate partner violence:    Fear of current or ex partner: Not on file    Emotionally abused: Not on file    Physically abused: Not on file    Forced sexual activity: Not on file  Other Topics Concern   Not on file  Social History Narrative   Married. Lives with husband (patient of Dr. Yong Channel). 1 son. No grandkids. 1 granddog.       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, swing dancing, dinner,  read       FAMILY HISTORY: Family History  Problem Relation Age of Onset   Heart disease Mother  CHF mother died 52   Arthritis Mother    Glaucoma Mother        sister as well   Alcohol abuse Father    Suicidality Father    Cancer Sister        lung cancer, smoker   Heart disease Sister        aortic valve replacement   Hyperlipidemia Brother    Hypertension Brother    COPD Brother    Arthritis Sister    Hypertension Sister    Glaucoma Sister    Hashimoto's thyroiditis Sister    Hypertension Son    Stroke Maternal Grandmother    Colon cancer Neg Hx    Colon polyps Neg Hx     ALLERGIES:  is allergic to ace inhibitors; benadryl [diphenhydramine]; diamox [acetazolamide]; sulfamethoxazole; and penicillins.  MEDICATIONS:  Current Outpatient Medications  Medication Sig Dispense Refill   acyclovir (ZOVIRAX) 400 MG tablet Take 1 tablet (400 mg total) by mouth 2 (two) times daily. 60 tablet 3   ALPHAGAN P 0.1 % SOLN Apply 1 drop topically See admin instructions. Apply 2 drops in right eye 3 times a day     amLODipine (NORVASC) 5 MG tablet Take 1 tablet (5 mg total) by mouth daily. 90 tablet 3   aspirin EC 81 MG tablet Take 81 mg by mouth daily.     bimatoprost (LUMIGAN) 0.03 % ophthalmic solution Place 1 drop into the right eye at bedtime.      Cholecalciferol (VITAMIN D3) 1000 UNITS CAPS Take 1,000 Units by mouth 2 (two) times daily.      co-enzyme Q-10 50 MG capsule Take 50 mg by mouth daily.       dexamethasone (DECADRON) 4 MG tablet 56m (5 tabs) on Day 15 of each cycle of treatment 15 tablet 3   dorzolamide-timolol (COSOPT) 22.3-6.8 MG/ML ophthalmic solution Place 2 drops into both eyes 2 (two) times daily.   11   fentaNYL (DURAGESIC) 25 MCG/HR Place 1 patch onto the skin every 3 (three) days. 10 patch 0   furosemide (LASIX) 20 MG tablet Take 1 tablet (20 mg total) by mouth daily. 30 tablet 1   hydrOXYzine (ATARAX/VISTARIL) 25 MG tablet  Take 1 tablet (25 mg total) by mouth 3 (three) times daily as needed. 30 tablet 0   lenalidomide (REVLIMID) 15 MG capsule Take 1 capsule (15 mg total) by mouth daily. Take on days 1-14 of each 21 day cycle. [employer] aHERD#[id number] capsule 0   magnesium hydroxide (MILK OF MAGNESIA) 400 MG/5ML suspension Take 15 mLs by mouth daily as needed for mild constipation or moderate constipation. 355 mL 0   Magnesium Oxide 500 MG TABS Take 1 tablet by mouth daily.       Multiple Vitamins-Minerals (MULTIVITAMIN WITH MINERALS) tablet Take 1 tablet by mouth daily.       ondansetron (ZOFRAN) 8 MG tablet Take 1 tablet (8 mg total) by mouth 2 (two) times daily as needed (Nausea or vomiting). 30 tablet 1   oxyCODONE-acetaminophen (PERCOCET) 5-325 MG tablet Take 1-2 tablets by mouth every 4 (four) hours as needed for moderate pain or severe pain. 90 tablet 0   polyethylene glycol (MIRALAX) packet Take 17 g by mouth daily. 30 each 1   potassium chloride SA (K-DUR,KLOR-CON) 20 MEQ tablet Take 1 tablet (20 mEq total) by mouth daily. 30 tablet 0   prochlorperazine (COMPAZINE) 10 MG tablet Take 1 tablet (10 mg total) by mouth every 6 (six) hours as needed (Nausea  or vomiting). 30 tablet 1   senna-docusate (SENOKOT-S) 8.6-50 MG tablet Take 2 tablets by mouth at bedtime. 60 tablet 2   No current facility-administered medications for this visit.    Facility-Administered Medications Ordered in Other Visits  Medication Dose Route Frequency Provider Last Rate Last Dose   [COMPLETED] bortezomib SQ (VELCADE) chemo injection 2 mg  1.3 mg/m2 (Treatment Plan Recorded) Subcutaneous Once [name], MD   2 mg at 01/10/19 1628    REVIEW OF SYSTEMS:    A 10+ POINT REVIEW OF SYSTEMS WAS OBTAINED including neurology, dermatology, psychiatry, cardiac, respiratory, lymph, extremities, GI, GU, Musculoskeletal, constitutional, breasts, reproductive, HEENT.  All pertinent positives are noted in the HPI.  All others are  negative.   PHYSICAL EXAMINATION: ECOG PERFORMANCE STATUS: 1-2  Vitals:   01/10/19 1506  BP: (!) 158/92  Pulse: 83  Resp: (!) 8  Temp: 98.3 F (36.8 C)  SpO2: 98%   Filed Weights   01/10/19 1506  Weight: 113 lb 11.2 oz (51.6 kg)   .Body mass index is 20.46 kg/m.  GENERAL:alert, in no acute distress and comfortable SKIN: no acute rashes, no significant lesions EYES: conjunctiva are pink and non-injected, sclera anicteric OROPHARYNX: MMM, no exudates, no oropharyngeal erythema or ulceration NECK: supple, no JVD LYMPH:  no palpable lymphadenopathy in the cervical, axillary or inguinal regions LUNGS: clear to auscultation b/l with normal respiratory effort HEART: regular rate & rhythm ABDOMEN:  normoactive bowel sounds , non tender, not distended. No palpable hepatosplenomegaly.  Extremity: 2+ pedal edema bilaterally PSYCH: alert & oriented x 3 with fluent speech NEURO: no focal motor/sensory deficits   LABORATORY DATA:  I have reviewed the data as listed  . CBC Latest Ref Rng & Units 01/10/2019 12/27/2018 12/20/2018  WBC 4.0 - 10.5 K/uL 3.2(L) 5.9 5.2  Hemoglobin 12.0 - 15.0 g/dL 9.7(L) 9.7(L) 9.1(L)  Hematocrit 36.0 - 46.0 % 30.9(L) 28.6(L) 28.8(L)  Platelets 150 - 400 K/uL 547(H) 293 338    . CMP Latest Ref Rng & Units 01/10/2019 12/27/2018 12/20/2018  Glucose 70 - 99 mg/dL 110(H) 117(H) 118(H)  BUN 8 - 23 mg/dL _0 Creatinine 0.44 - 1.00 mg/dL 0.65 0.69 0.84  Sodium 135 - 145 mmol/L 140 135 138  Potassium 3.5 - 5.1 mmol/L 4.1 4.5 3.7  Chloride 98 - 111 mmol/L 106 102 103  CO2 22 - 32 mmol/L _1 Calcium 8.9 - 10.3 mg/dL 8.3(L) 7.8(L) 8.8(L)  Total Protein 6.5 - 8.1 g/dL 7.7 8.7(H) 10.6(H)  Total Bilirubin 0.3 - 1.2 mg/dL 0.3 0.6 0.5  Alkaline Phos 38 - 126 U/L 196(H) 61 57  AST 15 - 41 U/L 16 8(L) 10(L)  ALT 0 - 44 U/L _2 12/01/18 BM Bx:   12/01/18 Flow Cytometry:       RADIOGRAPHIC STUDIES: I have personally  reviewed the radiological images as listed and agreed with the findings in the report. Dg Chest 2 View  Result Date: 12/19/2018 CLINICAL DATA:  Shortness of breath, history of multiple myeloma EXAM: CHEST - 2 VIEW COMPARISON:  11/19/2016 FINDINGS: The heart size and mediastinal contours are within normal limits. Both lungs are clear. The visualized skeletal structures are unremarkable. IMPRESSION: No active cardiopulmonary disease. Electronically Signed   By: Kathreen Devoid   On: 12/19/2018 20:47   Ct Abdomen Pelvis W Contrast  Result Date: 12/19/2018 CLINICAL DATA:  History of multiple myeloma.  Back pain. Abdominal distention. EXAM: CT ABDOMEN AND PELVIS WITH CONTRAST TECHNIQUE: Multidetector CT imaging of the abdomen and pelvis was performed using the standard protocol following bolus administration of intravenous contrast. CONTRAST:  38m ISOVUE-300 IOPAMIDOL (ISOVUE-300) INJECTION 61% COMPARISON:  None. FINDINGS: Lower chest: Airspace opacities are noted in the lung bases including right middle lobe, lingula and both lower lobes. This could reflect atelectasis or scarring. Heart is borderline in size. No effusions. Hepatobiliary: Small hypodensity anteriorly in the right hepatic lobe, likely small cyst. No suspicious focal hepatic abnormality or biliary ductal dilatation. Gallbladder unremarkable. Pancreas: No focal abnormality or ductal dilatation. Spleen: No focal abnormality.  Normal size. Adrenals/Urinary Tract: No adrenal abnormality. No focal renal abnormality. No stones or hydronephrosis. Urinary bladder is unremarkable. Stomach/Bowel: Large stool burden throughout the colon. Mild gaseous distention of the colon. No evidence of bowel obstruction. Vascular/Lymphatic: Aortic atherosclerosis. No enlarged abdominal or pelvic lymph nodes. Reproductive: No adnexal mass. Other: No free fluid or free air. Musculoskeletal: 3.2 cm expansile lytic destructive lesion noted in the right sacrum multiple  compression deformities throughout the thoracolumbar spine including T11, T12, L1 and L3. IMPRESSION: Large stool burden throughout the colon compatible with constipation. Large amount of stool in the rectosigmoid colon, cannot exclude early fecal impaction. Lytic destructive expansile lesion in the right sacral ala. Numerous compression fractures in the lower thoracic and lumbar spine. Findings likely represent known myeloma. Bibasilar atelectasis or scarring. Aortic atherosclerosis. Electronically Signed   By: KRolm BaptiseM.D.   On: 12/19/2018 22:17   Nm Pet Image Initial (pi) Whole Body  Result Date: 12/22/2018 CLINICAL DATA:  Initial treatment strategy for multiple myeloma. EXAM: NUCLEAR MEDICINE PET WHOLE BODY TECHNIQUE: 6.1 mCi F-18 FDG was injected intravenously. Full-ring PET imaging was performed from the skull base to thigh after the radiotracer. CT data was obtained and used for attenuation correction and anatomic localization. Fasting blood glucose: 135 mg/dl COMPARISON:  CT chest 12/01/2018 FINDINGS: Mediastinal blood pool activity: SUV max 1.8 HEAD/NECK: No hypermetabolic activity in the scalp. No hypermetabolic cervical lymph nodes. Incidental CT findings: none CHEST: No hypermetabolic mediastinal or hilar nodes. 1.9 cm calcified left thyroid nodule is hypermetabolic with SUV max = 7. 1.5 x 1.8 cm irregular focal nodular opacity in the lingula is hypermetabolic with SUV max = 6.3. Other ill-defined nodules in the posterior lingula measuring up to 9 mm (88/4) are also hypermetabolic. Small pleural lesion along the right heart border is hypermetabolic with SUV max = 2.8 Incidental CT findings: Atelectasis noted both lower lobes. ABDOMEN/PELVIS: No abnormal hypermetabolic activity within the liver, pancreas, adrenal glands, or spleen. No hypermetabolic lymph nodes in the abdomen or pelvis. Incidental CT findings: Marked stool volume and gaseous distention of colon noted from the cecum to the rectum.  There is abdominal aortic atherosclerosis without aneurysm. 10 mm hyperdense lesion interpolar right kidney without hypermetabolism on PET imaging, likely complex cyst SKELETON: Numerous hypermetabolic bone lesions are identified in the axial and appendicular skeleton. 2.3 cm soft tissue nodule anterior right fourth rib is hypermetabolic with SUV max = 9.3. A 3.2 x 2.9 cm soft tissue lesion upper right sacrum is hypermetabolic with SUV max = 135.0 index 1.5 cm lesion proximal right femur demonstrates SUV max = 5.7. Incidental CT findings: Bones are diffusely demineralized with heterogeneous mineralization throughout the thoracolumbar spine. Multilevel thoracic compression fracture suspected, but not well evaluated on this axial only study. EXTREMITIES: No hypermetabolic soft tissue lesions in the extremities. Incidental CT findings: none IMPRESSION: 1.  Numerous hypermetabolic bone lesions throughout the axial and appendicular skeleton consistent with the history of multiple myeloma. 2. Areas of hypermetabolic disease identified in both lungs suggesting multiple myeloma involvement. 3. 1.9 cm calcified left thyroid nodule is hypermetabolic, but indeterminate. 4. Colon is diffusely distended with gas and stool. Imaging features would be compatible with clinical constipation. 5.  Aortic Atherosclerois (ICD10-170.0) Electronically Signed   By: Misty Stanley M.D.   On: 12/22/2018 17:43   Ir Radiologist Eval & Mgmt  Result Date: 12/13/2018 Please refer to notes tab for details about interventional procedure. (Op Note)   ASSESSMENT & PLAN:  78 y.o. female with  1. Multiple Myeloma with IgG Lambda specificity Labs upon initial presentation from 12/01/18 CBC w/diff revealed HGB at 10.4 with MCV of 105.7. 11/28/18 CMP revealed Creatinine normal at 0.68 and Calcium normal at 8.9. 11/28/18 Beta 2 microglobulin at 2.63m (also a reading at 4.712mon the same day). 11/30/18 24HR UPEP revealed M spike at 75.1%. 11/28/18 SPEP  revealed M spike of 4.6g. 11/28/18 Immunoglobulins revealed IgG elevated at 618124m 12/01/18 BM Bx revealed hypercellular bone marrow with 70-80% CD138 immunohistochemistry (44% aspirate) lambda-restricted plasma cells as well as a kappa restricted population of B-cells 11/27/18 MRI Thoracic Spine which revealed Multifocal marrow signal abnormality consistent with metastatic disease or multiple myeloma. The patient has multiple compression fractures most consistent with pathologic injuries. Extensive marrow signal abnormality makes determining age of the fractures difficult but edema is most intense in T3. 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 She does not currently have elevated lymphocyte counts on peripheral blood draws.  PLAN: -Discussed pt labwork today, 01/10/19; blood counts and chemistries are stable. -Discussed the 12/22/18 PET/CT which revealed Numerous hypermetabolic bone lesions throughout the axial and appendicular skeleton consistent with the history of multiple Myeloma. 2. Areas of hypermetabolic disease identified in both lungs suggesting multiple myeloma involvement. 3. 1.9 cm calcified left thyroid nodule is hypermetabolic, but Indeterminate. 4. Colon is diffusely distended with gas and stool. Imaging features would be compatible with clinical constipation. 5.  Aortic Atherosclerois. -Will order US US thyroid in 1-2 months -Discussed that I do not recommend a biopsy of the left lung nodule at this point, but will look for response in this area as treatment continues. Will  monitor and discussed that I cannot rule out a primary lung cancer at this point, but that this is likely related to her myeloma. -Discussed that we could re-challenge the patient with Revlimid with antihistamines on-board, and holding Revlimid if there are any signs of a rash developing again. In the event that the pt cannot tolerate Revlimid, we could consider Cytoxan -Continue Pepcid and Claritin. Will add Hydroxyzine if rash presents again. -Begin Revlimid tomorrow, on D2. The pt will hold Revlimid if rash develops again. -The pt has no prohibitive toxicities from continuing C2D1 Velcade and Dexamethasone at this time. D1, 4, 8 and 11, and pt will take Dexamethasone on D15 at home. -I did contact her glaucoma specialist, Dr. BonEdilia Boegarding the standard treatment recommendation of 65m62mxamethasone weekly. Pt will be seeing  Dr. Edilia Bo again in 2 weeks for further evaluation. -Recommend leg elevation for leg swelling and leg wrapping with [facility] -Begin low dose Lasix and 74mq Potassium replacement -Advised that pt weigh herself while on Lasix -Continue 881maspirin -Continue 40033mcyclovir BID -Will increase to 32m82mr Fentanyl patch to smoothen pain control -Continue Oxycodone 1-2 pills every 4-6 hours as needed -Continue two tablets of Senna S at night time and daily Miralax, backing off if diarrhea develops or OTC Fleet enema -RT to sacrum an option for pain control -Continue Xgeva every 4 weeks, pt denies current dental concerns -Did refer the pt to IR for consideration of a vertebroplasty -Pt has at-home nursing care through [facility] the pt eat very well and continue to stay well hydrate, and stay as active as she can reasonably manage to -Will see the pt back in on D8 or D11 for toxicity check   plz add MD visit to 01/17/2019. Continue followup as per other scheduled appointments for C2 and C3 of treatment   All of the patients questions were  answered with apparent satisfaction. The patient knows to call the clinic with any problems, questions or concerns.  The total time spent in the appt was 50 minutes and more than 50% was on counseling and direct patient cares.    GautSullivan LoneMS AAHIVMS [facility]On[facility] Physician [facility]):       [phone] cell):  [phone]):           [phone] 4:26 PM  I, SchuBaldwin [place] acting as a scribe for Dr. GautSullivan Lone.I have reviewed the above documentation for accuracy and completeness, and I agree with the above. .GauBrunetta Genera

## 2019-01-10 ENCOUNTER — Other Ambulatory Visit: Payer: Self-pay | Admitting: *Deleted

## 2019-01-10 ENCOUNTER — Inpatient Hospital Stay: Payer: Medicare HMO

## 2019-01-10 ENCOUNTER — Inpatient Hospital Stay (HOSPITAL_BASED_OUTPATIENT_CLINIC_OR_DEPARTMENT_OTHER): Payer: Medicare HMO | Admitting: Hematology

## 2019-01-10 ENCOUNTER — Inpatient Hospital Stay: Payer: Medicare HMO | Attending: Hematology

## 2019-01-10 ENCOUNTER — Other Ambulatory Visit: Payer: Self-pay

## 2019-01-10 VITALS — BP 158/92 | HR 83 | Temp 98.3°F | Resp 8 | Ht 62.5 in | Wt 113.7 lb

## 2019-01-10 DIAGNOSIS — Z79899 Other long term (current) drug therapy: Secondary | ICD-10-CM

## 2019-01-10 DIAGNOSIS — D72820 Lymphocytosis (symptomatic): Secondary | ICD-10-CM

## 2019-01-10 DIAGNOSIS — Z87891 Personal history of nicotine dependence: Secondary | ICD-10-CM | POA: Insufficient documentation

## 2019-01-10 DIAGNOSIS — Z5111 Encounter for antineoplastic chemotherapy: Secondary | ICD-10-CM

## 2019-01-10 DIAGNOSIS — C9000 Multiple myeloma not having achieved remission: Secondary | ICD-10-CM | POA: Insufficient documentation

## 2019-01-10 DIAGNOSIS — R600 Localized edema: Secondary | ICD-10-CM

## 2019-01-10 DIAGNOSIS — K5900 Constipation, unspecified: Secondary | ICD-10-CM

## 2019-01-10 DIAGNOSIS — M549 Dorsalgia, unspecified: Secondary | ICD-10-CM | POA: Insufficient documentation

## 2019-01-10 DIAGNOSIS — M533 Sacrococcygeal disorders, not elsewhere classified: Secondary | ICD-10-CM

## 2019-01-10 DIAGNOSIS — R222 Localized swelling, mass and lump, trunk: Secondary | ICD-10-CM

## 2019-01-10 DIAGNOSIS — R21 Rash and other nonspecific skin eruption: Secondary | ICD-10-CM

## 2019-01-10 DIAGNOSIS — G893 Neoplasm related pain (acute) (chronic): Secondary | ICD-10-CM

## 2019-01-10 DIAGNOSIS — Z7189 Other specified counseling: Secondary | ICD-10-CM

## 2019-01-10 LAB — CBC WITH DIFFERENTIAL (CANCER CENTER ONLY)
Abs Immature Granulocytes: 0.01 10*3/uL (ref 0.00–0.07)
Basophils Absolute: 0.1 10*3/uL (ref 0.0–0.1)
Basophils Relative: 3 %
Eosinophils Absolute: 0.1 10*3/uL (ref 0.0–0.5)
Eosinophils Relative: 3 %
HCT: 30.9 % — ABNORMAL LOW (ref 36.0–46.0)
Hemoglobin: 9.7 g/dL — ABNORMAL LOW (ref 12.0–15.0)
Immature Granulocytes: 0 %
Lymphocytes Relative: 27 %
Lymphs Abs: 0.9 10*3/uL (ref 0.7–4.0)
MCH: 32.9 pg (ref 26.0–34.0)
MCHC: 31.4 g/dL (ref 30.0–36.0)
MCV: 104.7 fL — ABNORMAL HIGH (ref 80.0–100.0)
Monocytes Absolute: 0.4 10*3/uL (ref 0.1–1.0)
Monocytes Relative: 12 %
Neutro Abs: 1.8 10*3/uL (ref 1.7–7.7)
Neutrophils Relative %: 55 %
Platelet Count: 547 10*3/uL — ABNORMAL HIGH (ref 150–400)
RBC: 2.95 MIL/uL — ABNORMAL LOW (ref 3.87–5.11)
RDW: 15.8 % — ABNORMAL HIGH (ref 11.5–15.5)
WBC Count: 3.2 10*3/uL — ABNORMAL LOW (ref 4.0–10.5)
nRBC: 0 % (ref 0.0–0.2)

## 2019-01-10 LAB — CMP (CANCER CENTER ONLY)
ALT: 15 U/L (ref 0–44)
AST: 16 U/L (ref 15–41)
Albumin: 2.7 g/dL — ABNORMAL LOW (ref 3.5–5.0)
Alkaline Phosphatase: 196 U/L — ABNORMAL HIGH (ref 38–126)
Anion gap: 8 (ref 5–15)
BUN: 12 mg/dL (ref 8–23)
CO2: 26 mmol/L (ref 22–32)
Calcium: 8.3 mg/dL — ABNORMAL LOW (ref 8.9–10.3)
Chloride: 106 mmol/L (ref 98–111)
Creatinine: 0.65 mg/dL (ref 0.44–1.00)
GFR, Est AFR Am: >60 mL/min (ref >60)
GFR, Estimated: >60 mL/min (ref >60)
Glucose, Bld: 110 mg/dL — ABNORMAL HIGH (ref 70–99)
Potassium: 4.1 mmol/L (ref 3.5–5.1)
Sodium: 140 mmol/L (ref 135–145)
Total Bilirubin: 0.3 mg/dL (ref 0.3–1.2)
Total Protein: 7.7 g/dL (ref 6.5–8.1)

## 2019-01-10 MED ORDER — FUROSEMIDE 20 MG PO TABS: 20.0000 mg | ORAL_TABLET | Freq: Every day | ORAL | 1 refills | Status: DC

## 2019-01-10 MED ORDER — DEXAMETHASONE 4 MG PO TABS
20.0000 mg | ORAL_TABLET | Freq: Once | ORAL | Status: AC
  Administered 2019-01-10: 20 mg via ORAL

## 2019-01-10 MED ORDER — FENTANYL 25 MCG/HR TD PT72: 1.0000 | MEDICATED_PATCH | TRANSDERMAL | 0 refills | Status: DC

## 2019-01-10 MED ORDER — HYDROXYZINE HCL 25 MG PO TABS: 25.0000 mg | ORAL_TABLET | Freq: Three times a day (TID) | ORAL | 0 refills | Status: DC | PRN

## 2019-01-10 MED ORDER — BORTEZOMIB CHEMO SQ INJECTION 3.5 MG (2.5MG/ML)
1.3000 mg/m2 | Freq: Once | INTRAMUSCULAR | Status: AC
  Administered 2019-01-10: 2 mg via SUBCUTANEOUS
  Filled 2019-01-10: qty 0.8

## 2019-01-10 MED ORDER — PROCHLORPERAZINE MALEATE 10 MG PO TABS
ORAL_TABLET | ORAL | Status: AC
  Filled 2019-01-10: qty 1

## 2019-01-10 MED ORDER — DEXAMETHASONE 4 MG PO TABS
ORAL_TABLET | ORAL | Status: AC
  Filled 2019-01-10: qty 5

## 2019-01-10 MED ORDER — PROCHLORPERAZINE MALEATE 10 MG PO TABS
10.0000 mg | ORAL_TABLET | Freq: Once | ORAL | Status: AC
  Administered 2019-01-10: 10 mg via ORAL

## 2019-01-10 MED ORDER — POTASSIUM CHLORIDE CRYS ER 20 MEQ PO TBCR: 20.0000 meq | EXTENDED_RELEASE_TABLET | Freq: Every day | ORAL | 0 refills | Status: DC

## 2019-01-10 NOTE — Patient Instructions (Signed)
Begin Revlimid tomorrow 01/11/19 Continue on Pepcid and Claritin If rash develops again, take Hydroxyzine, stop taking Revlmid, and call my office.

## 2019-01-10 NOTE — Patient Instructions (Signed)
[facility] Discharge Instructions for Patients Receiving Chemotherapy  Today you received the following chemotherapy agents Velcade.  To help prevent nausea and vomiting after your treatment, we encourage you to take your nausea medication as directed.  If you develop nausea and vomiting that is not controlled by your nausea medication, call the clinic.   BELOW ARE SYMPTOMS THAT SHOULD BE REPORTED IMMEDIATELY:  *FEVER GREATER THAN 100.5 F  *CHILLS WITH OR WITHOUT FEVER  NAUSEA AND VOMITING THAT IS NOT CONTROLLED WITH YOUR NAUSEA MEDICATION  *UNUSUAL SHORTNESS OF BREATH  *UNUSUAL BRUISING OR BLEEDING  TENDERNESS IN MOUTH AND THROAT WITH OR WITHOUT PRESENCE OF ULCERS  *URINARY PROBLEMS  *BOWEL PROBLEMS  UNUSUAL RASH Items with * indicate a potential emergency and should be followed up as soon as possible.  Feel free to call the clinic should you have any questions or concerns. The clinic phone number is [phone].  Please show the CHEMO ALERT CARD at check-in to the Emergency Department and triage nurse.   

## 2019-01-11 ENCOUNTER — Ambulatory Visit: Payer: Medicare HMO | Admitting: Sports Medicine

## 2019-01-11 DIAGNOSIS — M8458XD Pathological fracture in neoplastic disease, other specified site, subsequent encounter for fracture with routine healing: Secondary | ICD-10-CM | POA: Diagnosis not present

## 2019-01-11 DIAGNOSIS — C9000 Multiple myeloma not having achieved remission: Secondary | ICD-10-CM | POA: Diagnosis not present

## 2019-01-11 DIAGNOSIS — H409 Unspecified glaucoma: Secondary | ICD-10-CM | POA: Diagnosis not present

## 2019-01-11 DIAGNOSIS — G893 Neoplasm related pain (acute) (chronic): Secondary | ICD-10-CM | POA: Diagnosis not present

## 2019-01-11 DIAGNOSIS — Z7952 Long term (current) use of systemic steroids: Secondary | ICD-10-CM | POA: Diagnosis not present

## 2019-01-11 DIAGNOSIS — D72820 Lymphocytosis (symptomatic): Secondary | ICD-10-CM | POA: Diagnosis not present

## 2019-01-11 DIAGNOSIS — M8580 Other specified disorders of bone density and structure, unspecified site: Secondary | ICD-10-CM | POA: Diagnosis not present

## 2019-01-11 DIAGNOSIS — I10 Essential (primary) hypertension: Secondary | ICD-10-CM | POA: Diagnosis not present

## 2019-01-11 DIAGNOSIS — R609 Edema, unspecified: Secondary | ICD-10-CM | POA: Diagnosis not present

## 2019-01-11 DIAGNOSIS — D539 Nutritional anemia, unspecified: Secondary | ICD-10-CM | POA: Diagnosis not present

## 2019-01-11 LAB — MULTIPLE MYELOMA PANEL, SERUM
Albumin SerPl Elph-Mcnc: 2.9 g/dL (ref 2.9–4.4)
Albumin/Glob SerPl: 0.8 (ref 0.7–1.7)
Alpha 1: 0.4 g/dL (ref 0.0–0.4)
Alpha2 Glob SerPl Elph-Mcnc: 0.9 g/dL (ref 0.4–1.0)
B-Globulin SerPl Elph-Mcnc: 2.7 g/dL — ABNORMAL HIGH (ref 0.7–1.3)
Gamma Glob SerPl Elph-Mcnc: 0.2 g/dL — ABNORMAL LOW (ref 0.4–1.8)
Globulin, Total: 4.1 g/dL — ABNORMAL HIGH (ref 2.2–3.9)
IgA: 21 mg/dL — ABNORMAL LOW (ref 64–422)
IgG (Immunoglobin G), Serum: 2756 mg/dL — ABNORMAL HIGH (ref 586–1602)
IgM (Immunoglobulin M), Srm: 18 mg/dL — ABNORMAL LOW (ref 26–217)
M Protein SerPl Elph-Mcnc: 2 g/dL — ABNORMAL HIGH
Total Protein ELP: 7 g/dL (ref 6.0–8.5)

## 2019-01-12 ENCOUNTER — Telehealth: Payer: Self-pay | Admitting: *Deleted

## 2019-01-12 NOTE — Telephone Encounter (Signed)
Patient called to update on resuming Revlimid yesterday. She states she had 2 episodes of diarrhea late yesterday, no nausea. No further episodes of diarrhea since. Had normal BM today. No rash or temperature noted. Taking all meds as prescribe. Encouraged to contact office for any questions or concerns. She verbalized understanding.

## 2019-01-13 ENCOUNTER — Other Ambulatory Visit: Payer: Self-pay

## 2019-01-13 ENCOUNTER — Inpatient Hospital Stay: Payer: Medicare HMO

## 2019-01-13 VITALS — BP 148/82 | HR 78 | Temp 98.7°F | Resp 17

## 2019-01-13 DIAGNOSIS — Z87891 Personal history of nicotine dependence: Secondary | ICD-10-CM | POA: Diagnosis not present

## 2019-01-13 DIAGNOSIS — K5900 Constipation, unspecified: Secondary | ICD-10-CM | POA: Diagnosis not present

## 2019-01-13 DIAGNOSIS — M549 Dorsalgia, unspecified: Secondary | ICD-10-CM | POA: Diagnosis not present

## 2019-01-13 DIAGNOSIS — C9000 Multiple myeloma not having achieved remission: Secondary | ICD-10-CM | POA: Diagnosis not present

## 2019-01-13 DIAGNOSIS — Z5111 Encounter for antineoplastic chemotherapy: Secondary | ICD-10-CM | POA: Diagnosis not present

## 2019-01-13 DIAGNOSIS — Z79899 Other long term (current) drug therapy: Secondary | ICD-10-CM | POA: Diagnosis not present

## 2019-01-13 DIAGNOSIS — Z7189 Other specified counseling: Secondary | ICD-10-CM

## 2019-01-13 DIAGNOSIS — D72820 Lymphocytosis (symptomatic): Secondary | ICD-10-CM | POA: Diagnosis not present

## 2019-01-13 MED ORDER — BORTEZOMIB CHEMO SQ INJECTION 3.5 MG (2.5MG/ML)
1.3000 mg/m2 | Freq: Once | INTRAMUSCULAR | Status: AC
  Administered 2019-01-13: 2 mg via SUBCUTANEOUS
  Filled 2019-01-13: qty 0.8

## 2019-01-13 MED ORDER — PROCHLORPERAZINE MALEATE 10 MG PO TABS
ORAL_TABLET | ORAL | Status: AC
  Filled 2019-01-13: qty 1

## 2019-01-13 MED ORDER — PROCHLORPERAZINE MALEATE 10 MG PO TABS
10.0000 mg | ORAL_TABLET | Freq: Once | ORAL | Status: AC
  Administered 2019-01-13: 10 mg via ORAL

## 2019-01-16 ENCOUNTER — Telehealth: Payer: Self-pay | Admitting: *Deleted

## 2019-01-16 NOTE — Telephone Encounter (Signed)
Called to report that no rash or other side effects noted at this time since resuming Revlimid. Has been able to decrease Percocet since Fentanyl dose was increased and pain is not increasing. Has increased amount of time ambulating in house - added exercises, such as marching in place and leg lifts while seated. Patient states  She feels stronger than she did.

## 2019-01-17 ENCOUNTER — Other Ambulatory Visit: Payer: Self-pay

## 2019-01-17 ENCOUNTER — Inpatient Hospital Stay: Payer: Medicare HMO

## 2019-01-17 ENCOUNTER — Inpatient Hospital Stay (HOSPITAL_BASED_OUTPATIENT_CLINIC_OR_DEPARTMENT_OTHER): Payer: Medicare HMO | Admitting: Hematology

## 2019-01-17 ENCOUNTER — Other Ambulatory Visit: Payer: Self-pay | Admitting: *Deleted

## 2019-01-17 VITALS — BP 148/79 | HR 76 | Temp 98.6°F | Resp 18 | Ht 62.5 in | Wt 109.2 lb

## 2019-01-17 DIAGNOSIS — K5900 Constipation, unspecified: Secondary | ICD-10-CM | POA: Diagnosis not present

## 2019-01-17 DIAGNOSIS — M549 Dorsalgia, unspecified: Secondary | ICD-10-CM

## 2019-01-17 DIAGNOSIS — G893 Neoplasm related pain (acute) (chronic): Secondary | ICD-10-CM

## 2019-01-17 DIAGNOSIS — Z79899 Other long term (current) drug therapy: Secondary | ICD-10-CM

## 2019-01-17 DIAGNOSIS — R600 Localized edema: Secondary | ICD-10-CM

## 2019-01-17 DIAGNOSIS — Z87891 Personal history of nicotine dependence: Secondary | ICD-10-CM | POA: Diagnosis not present

## 2019-01-17 DIAGNOSIS — C9000 Multiple myeloma not having achieved remission: Secondary | ICD-10-CM

## 2019-01-17 DIAGNOSIS — Z5111 Encounter for antineoplastic chemotherapy: Secondary | ICD-10-CM | POA: Diagnosis not present

## 2019-01-17 DIAGNOSIS — D72820 Lymphocytosis (symptomatic): Secondary | ICD-10-CM | POA: Diagnosis not present

## 2019-01-17 DIAGNOSIS — Z7189 Other specified counseling: Secondary | ICD-10-CM

## 2019-01-17 LAB — CMP (CANCER CENTER ONLY)
ALT: 18 U/L (ref 0–44)
AST: 14 U/L — ABNORMAL LOW (ref 15–41)
Albumin: 3.1 g/dL — ABNORMAL LOW (ref 3.5–5.0)
Alkaline Phosphatase: 163 U/L — ABNORMAL HIGH (ref 38–126)
Anion gap: 11 (ref 5–15)
BUN: 12 mg/dL (ref 8–23)
CO2: 26 mmol/L (ref 22–32)
Calcium: 8.4 mg/dL — ABNORMAL LOW (ref 8.9–10.3)
Chloride: 101 mmol/L (ref 98–111)
Creatinine: 0.63 mg/dL (ref 0.44–1.00)
GFR, Est AFR Am: >60 mL/min (ref >60)
GFR, Estimated: >60 mL/min (ref >60)
Glucose, Bld: 103 mg/dL — ABNORMAL HIGH (ref 70–99)
Potassium: 4.4 mmol/L (ref 3.5–5.1)
Sodium: 138 mmol/L (ref 135–145)
Total Bilirubin: 0.4 mg/dL (ref 0.3–1.2)
Total Protein: 7.3 g/dL (ref 6.5–8.1)

## 2019-01-17 LAB — CBC WITH DIFFERENTIAL (CANCER CENTER ONLY)
Abs Immature Granulocytes: 0.03 10*3/uL (ref 0.00–0.07)
Basophils Absolute: 0 10*3/uL (ref 0.0–0.1)
Basophils Relative: 0 %
Eosinophils Absolute: 0.2 10*3/uL (ref 0.0–0.5)
Eosinophils Relative: 4 %
HCT: 31 % — ABNORMAL LOW (ref 36.0–46.0)
Hemoglobin: 10 g/dL — ABNORMAL LOW (ref 12.0–15.0)
Immature Granulocytes: 1 %
Lymphocytes Relative: 13 %
Lymphs Abs: 0.7 10*3/uL (ref 0.7–4.0)
MCH: 33.2 pg (ref 26.0–34.0)
MCHC: 32.3 g/dL (ref 30.0–36.0)
MCV: 103 fL — ABNORMAL HIGH (ref 80.0–100.0)
Monocytes Absolute: 0.5 10*3/uL (ref 0.1–1.0)
Monocytes Relative: 9 %
Neutro Abs: 3.7 10*3/uL (ref 1.7–7.7)
Neutrophils Relative %: 73 %
Platelet Count: 283 10*3/uL (ref 150–400)
RBC: 3.01 MIL/uL — ABNORMAL LOW (ref 3.87–5.11)
RDW: 15.7 % — ABNORMAL HIGH (ref 11.5–15.5)
WBC Count: 5.2 10*3/uL (ref 4.0–10.5)
nRBC: 0 % (ref 0.0–0.2)

## 2019-01-17 MED ORDER — BORTEZOMIB CHEMO SQ INJECTION 3.5 MG (2.5MG/ML)
1.3000 mg/m2 | Freq: Once | INTRAMUSCULAR | Status: AC
  Administered 2019-01-17: 15:00:00 2 mg via SUBCUTANEOUS
  Filled 2019-01-17: qty 0.8

## 2019-01-17 MED ORDER — DEXAMETHASONE 4 MG PO TABS
ORAL_TABLET | ORAL | Status: AC
  Filled 2019-01-17: qty 5

## 2019-01-17 MED ORDER — DEXAMETHASONE 4 MG PO TABS
20.0000 mg | ORAL_TABLET | Freq: Once | ORAL | Status: AC
  Administered 2019-01-17: 20 mg via ORAL

## 2019-01-17 MED ORDER — PROCHLORPERAZINE MALEATE 10 MG PO TABS
10.0000 mg | ORAL_TABLET | Freq: Once | ORAL | Status: AC
  Administered 2019-01-17: 10 mg via ORAL

## 2019-01-17 MED ORDER — DENOSUMAB 120 MG/1.7ML ~~LOC~~ SOLN
SUBCUTANEOUS | Status: AC
  Filled 2019-01-17: qty 1.7

## 2019-01-17 MED ORDER — PROCHLORPERAZINE MALEATE 10 MG PO TABS
ORAL_TABLET | ORAL | Status: AC
  Filled 2019-01-17: qty 1

## 2019-01-17 MED ORDER — DENOSUMAB 120 MG/1.7ML ~~LOC~~ SOLN
120.0000 mg | Freq: Once | SUBCUTANEOUS | Status: AC
  Administered 2019-01-17: 15:00:00 120 mg via SUBCUTANEOUS

## 2019-01-17 NOTE — Progress Notes (Signed)
HEMATOLOGY/ONCOLOGY CONSULTATION NOTE  Date of Service: 01/17/2019  Patient Care Team: [name], MD as PCP - General (Family Medicine)  Dr. Ronney Lion as [place] Specialist  [phone]  CHIEF COMPLAINTS/PURPOSE OF CONSULTATION:  Multiple Myeloma  HISTORY OF PRESENTING ILLNESS:   Megan Olson is a wonderful 78 y.o. female who has been referred to [place] by Dr. 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 She is accompanied today by her husband. The pt reports that she is doing well overall.   The pt notes that she was doing extensive yard work in October 2019, and began to feel back pain a few days after this, which she attributed to muscle pain. She recalls taking a deep breath, and developing sudden acute pain, and presented to care with her PCP's office. She began exercises for her back pain, without relief, then began PT without relief, then was referred to Dr. Paulla Fore in sports medicine in late January 2020. She had an XR which revealed a compression fracture at T11, then subsequently had an MRI, and a bone marrow biopsy. The pt notes that her back pain "seemed to move around." She endorses pain "up the whole left side" of her back.  The pt notes that she is not able to stand up straight due to her back pain, which she feels limits her ability to take a deep breath, and endorses pain exacerbation when she does take a deep breath. The pt needs assistance from sitting to lying from her husband. She is using about 3 Percocet a day.  The pt notes worsened constipation since beginning Percocet, and notes that her most recent laxative order was not covered by her insurance. She is using prune juice and milk of magnesia. She took Senokot S BID without relief.  The pt reports that prior to her recent back pain, she had very few medical concerns. She endorses controlled BP, and has been monitored for DM but after closely  watching her diet her A1C decreased to 5.8. She has glaucoma, and has had surgery in both eyes. Right eye with stent. The pt sees Dr. Edilia Bo at [facility] for her eye care. The pt notes that her vision has been recently "pretty good." The pt notes that she has been advised to limit treatment with steroids for her glaucoma. She denies heart or lung problems. Denies previous back problems. Last DEXA scan 3 years ago, and endorses osteopenia. She notes that she took Fosamax for 3-4 years, and stopped 5-6 years ago. She takes Vitamin D, a multivitamin, and magnesium.  The pt notes that she quit smoking cigarettes when she was 27, started when she was about 20. The pt consumes alcohol rarely, but not since beginning narcotics. She previously worked in [employer].  Of note prior to the patient's visit today, pt has had an MRI Thoracic Spine completed on 11/27/18 with results revealing Multifocal marrow signal abnormality consistent with metastatic disease or multiple myeloma. The patient has multiple compression fractures most consistent with pathologic injuries. Extensive marrow signal abnormality makes determining age of the fractures difficult but edema is most intense in T3. 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  Most recent lab results (12/01/18) of CBC w/diff is as follows: all values are WNL except for RBC at 3.17, HGB at 10.4, HCT at 33.5, MCV at 105.7. 11/28/18 CMP  revealed all values WNL except for Glucose at 105, Total Protein at 10.2, Albumin at 3.1 11/30/18 24HR UPEP revealed all values WNL except for M-spike at 75.1%. 11/28/18 Bega-2 microglobulin slightly elevated at 2.6 11/28/18 SPEP revealed M spike at 4.6g 11/28/18 Immunoglobulins revealed IgG at 6181, IgA at 24, IgM at 16, and IgE at 6.  On review of systems, pt reports constipation, back pain, stable energy levels, ankle swelling, tenderness at T3,  lower back pain, and denies abdominal pains, neck pain, and any other symptoms.   On PMHx the pt reports redundant colon, single hemorrhoid, glaucoma, HTN, osteopenia, Tonsillectomy, right eye stent and multiple eye surgeries. On Social Hx the pt reports rare alcohol use, smoked cigarettes between ages 20 and 78. 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 On Family Hx the pt reports sister died from small cell lung cancer and was a lifelong smoker, brother's daughter died of breast cancer with BRCA1 and BRCA2 mutations. Cousin with female breast cancer.  Interval History:   [name] returns today for management and evaluation of her recently diagnosed multiple myeloma. The patient's last visit with [place] was on 01/10/19. The pt reports that she is doing well overall.   The pt reports that she has been feeling better in the last week. She has taken one short acting pain medication each of the last 3 days. She notes that she is exercising more in the house, marching in place. She notes that she is noticing improvements in her balance. The pt notes that she is doing the exercises that she learned from PT. The pt's husband is assisting her with transfers and ambulation. The pt notes that she has a fear of falling which is limiting her.   In the interim, the pt was able to return to Revlimid without rashes. She notes that she tolerated Revlimid well on the re-challenge. She had some diarrhea for one day, which resolved on its own. The pt adds that she is sleeping well.   The pt notes that her leg swelling has improved after she began Lasix and potassium replacement.  Lab results today (01/17/19) of CBC w/diff and CMP is as follows: all values are WNL except for RBC at 3.01, HGB at 10.0, HCT at 31.0, MCV at 103.0, RDW at 15.7, Glucose at 103, Calcium at 8.4, Albumin at 3.1, AST at 14, Alk Phos at 163.  On review of systems, pt reports improved pain, moving her bowels well, sleeping well, improved leg swelling,  increasing activity, and denies skin rashes, mouth sores, and any other symptoms.   MEDICAL HISTORY:  Past Medical History:  Diagnosis Date   Cancer [facility])    multiple myeloma   Glaucoma    HYPERTENSION 03/11/2007   OSTEOPENIA 03/11/2007    SURGICAL HISTORY: Past Surgical History:  Procedure Laterality Date   BREAST EXCISIONAL BIOPSY Right 2000   BREAST LUMPECTOMY  1990   benign   DILATION AND CURETTAGE OF UTERUS     bleeding at menopause. No uterine cancer   IR RADIOLOGIST EVAL & MGMT  12/13/2018   TONSILLECTOMY     age 60    SOCIAL HISTORY: Social History   Socioeconomic History   Marital status: Married    Spouse name: Not on file   Number of children: Not on file   Years of education: Not on file   Highest education level: Not on file  Occupational History   Not on file  Social Needs   Financial resource strain: Not on  file   Food insecurity:    Worry: Not on file    Inability: Not on file   Transportation needs:    Medical: Not on file    Non-medical: Not on file  Tobacco Use   Smoking status: Former Smoker    Packs/day: 0.50    Years: 7.00    Pack years: 3.50    Types: Cigarettes    Last attempt to quit: 01/04/1961    Years since quitting: 58.0   Smokeless tobacco: Never Used  Substance and Sexual Activity   Alcohol use: Yes    Alcohol/week: 1.0 standard drinks    Types: 1 Standard drinks or equivalent per week   Drug use: No   Sexual activity: Not Currently  Lifestyle   Physical activity:    Days per week: Not on file    Minutes per session: Not on file   Stress: Not on file  Relationships   Social connections:    Talks on phone: Not on file    Gets together: Not on file    Attends religious service: Not on file    Active member of club or organization: Not on file    Attends meetings of clubs or organizations: Not on file    Relationship status: Not on file   Intimate partner violence:    Fear of current or ex  partner: Not on file    Emotionally abused: Not on file    Physically abused: Not on file    Forced sexual activity: Not on file  Other Topics Concern   Not on file  Social History Narrative   Married. Lives with husband (patient of Dr. Yong Channel). 1 son. No grandkids. 1 granddog.       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, swing dancing, dinner, read       FAMILY HISTORY: Family History  Problem Relation Age of Onset   Heart disease Mother        CHF mother died 21   Arthritis Mother    Glaucoma Mother        sister as well   Alcohol abuse Father    Suicidality Father    Cancer Sister        lung cancer, smoker   Heart disease Sister        aortic valve replacement   Hyperlipidemia Brother    Hypertension Brother    COPD Brother    Arthritis Sister    Hypertension Sister    Glaucoma Sister    Hashimoto's thyroiditis Sister    Hypertension Son    Stroke Maternal Grandmother    Colon cancer Neg Hx    Colon polyps Neg Hx     ALLERGIES:  is allergic to ace inhibitors; benadryl [diphenhydramine]; diamox [acetazolamide]; sulfamethoxazole; and penicillins.  MEDICATIONS:  Current Outpatient Medications  Medication Sig Dispense Refill   acyclovir (ZOVIRAX) 400 MG tablet Take 1 tablet (400 mg total) by mouth 2 (two) times daily. 60 tablet 3   ALPHAGAN P 0.1 % SOLN Apply 1 drop topically See admin instructions. Apply 2 drops in right eye 3 times a day     amLODipine (NORVASC) 5 MG tablet Take 1 tablet (5 mg total) by mouth daily. 90 tablet 3   aspirin EC 81 MG tablet Take 81 mg by mouth daily.     bimatoprost (LUMIGAN) 0.03 % ophthalmic solution Place 1 drop into the  right eye at bedtime.      Cholecalciferol (VITAMIN D3) 1000 UNITS CAPS Take 1,000 Units by mouth 2 (two) times daily.      co-enzyme Q-10 50 MG capsule Take 50 mg by mouth daily.       dexamethasone  (DECADRON) 4 MG tablet '20mg'$  (5 tabs) on Day 15 of each cycle of treatment 15 tablet 3   dorzolamide-timolol (COSOPT) 22.3-6.8 MG/ML ophthalmic solution Place 2 drops into both eyes 2 (two) times daily.   11   fentaNYL (DURAGESIC) 25 MCG/HR Place 1 patch onto the skin every 3 (three) days. 10 patch 0   furosemide (LASIX) 20 MG tablet Take 1 tablet (20 mg total) by mouth daily. 30 tablet 1   hydrOXYzine (ATARAX/VISTARIL) 25 MG tablet Take 1 tablet (25 mg total) by mouth 3 (three) times daily as needed. 30 tablet 0   lenalidomide (REVLIMID) 15 MG capsule Take 1 capsule (15 mg total) by mouth daily. Take on days 1-14 of each 21 day cycle. Celgene CWCB#[id number] 14 capsule 0   magnesium hydroxide (MILK OF MAGNESIA) 400 MG/5ML suspension Take 15 mLs by mouth daily as needed for mild constipation or moderate constipation. 355 mL 0   Magnesium Oxide 500 MG TABS Take 1 tablet by mouth daily.       Multiple Vitamins-Minerals (MULTIVITAMIN WITH MINERALS) tablet Take 1 tablet by mouth daily.       ondansetron (ZOFRAN) 8 MG tablet Take 1 tablet (8 mg total) by mouth 2 (two) times daily as needed (Nausea or vomiting). 30 tablet 1   oxyCODONE-acetaminophen (PERCOCET) 5-325 MG tablet Take 1-2 tablets by mouth every 4 (four) hours as needed for moderate pain or severe pain. 90 tablet 0   polyethylene glycol (MIRALAX) packet Take 17 g by mouth daily. 30 each 1   potassium chloride SA (K-DUR,KLOR-CON) 20 MEQ tablet Take 1 tablet (20 mEq total) by mouth daily. 30 tablet 0   prochlorperazine (COMPAZINE) 10 MG tablet Take 1 tablet (10 mg total) by mouth every 6 (six) hours as needed (Nausea or vomiting). 30 tablet 1   senna-docusate (SENOKOT-S) 8.6-50 MG tablet Take 2 tablets by mouth at bedtime. 60 tablet 2   No current facility-administered medications for this visit.     REVIEW OF SYSTEMS:    A 10+ POINT REVIEW OF SYSTEMS WAS OBTAINED including neurology, dermatology, psychiatry, cardiac, respiratory,  lymph, extremities, GI, GU, Musculoskeletal, constitutional, breasts, reproductive, HEENT.  All pertinent positives are noted in the HPI.  All others are negative.   PHYSICAL EXAMINATION: ECOG PERFORMANCE STATUS: 1-2  Vitals:   01/17/19 1351  BP: (!) 148/79  Pulse: 76  Resp: 18  Temp: 98.6 F (37 C)  SpO2: 99%   Filed Weights   01/17/19 1351  Weight: 110 lb 6.4 oz (50.1 kg)   .Body mass index is 19.87 kg/m.  GENERAL:alert, in no acute distress and comfortable SKIN: no acute rashes, no significant lesions EYES: conjunctiva are pink and non-injected, sclera anicteric OROPHARYNX: MMM, no exudates, no oropharyngeal erythema or ulceration NECK: supple, no JVD LYMPH:  no palpable lymphadenopathy in the cervical, axillary or inguinal regions LUNGS: clear to auscultation b/l with normal respiratory effort HEART: regular rate & rhythm ABDOMEN:  normoactive bowel sounds , non tender, not distended. No palpable hepatosplenomegaly.  Extremity: 2+ pedal edema PSYCH: alert & oriented x 3 with fluent speech NEURO: no focal motor/sensory deficits   LABORATORY DATA:  I have reviewed the data as listed  . CBC Latest Ref  Rng & Units 01/17/2019 01/10/2019 12/27/2018  WBC 4.0 - 10.5 K/uL 5.2 3.2(L) 5.9  Hemoglobin 12.0 - 15.0 g/dL 10.0(L) 9.7(L) 9.7(L)  Hematocrit 36.0 - 46.0 % 31.0(L) 30.9(L) 28.6(L)  Platelets 150 - 400 K/uL 283 547(H) 293    . CMP Latest Ref Rng & Units 01/17/2019 01/10/2019 12/27/2018  Glucose 70 - 99 mg/dL 103(H) 110(H) 117(H)  BUN 8 - 23 mg/dL '12 12 18  '$ Creatinine 0.44 - 1.00 mg/dL 0.63 0.65 0.69  Sodium 135 - 145 mmol/L 138 140 135  Potassium 3.5 - 5.1 mmol/L 4.4 4.1 4.5  Chloride 98 - 111 mmol/L 101 106 102  CO2 22 - 32 mmol/L '26 26 24  '$ Calcium 8.9 - 10.3 mg/dL 8.4(L) 8.3(L) 7.8(L)  Total Protein 6.5 - 8.1 g/dL 7.3 7.7 8.7(H)  Total Bilirubin 0.3 - 1.2 mg/dL 0.4 0.3 0.6  Alkaline Phos 38 - 126 U/L 163(H) 196(H) 61  AST 15 - 41 U/L 14(L) 16 8(L)  ALT 0 - 44 U/L  '18 15 10             '$ 12/01/18 BM Bx:   12/01/18 Flow Cytometry:       RADIOGRAPHIC STUDIES: I have personally reviewed the radiological images as listed and agreed with the findings in the report. Dg Chest 2 View  Result Date: 12/19/2018 CLINICAL DATA:  Shortness of breath, history of multiple myeloma EXAM: CHEST - 2 VIEW COMPARISON:  11/19/2016 FINDINGS: The heart size and mediastinal contours are within normal limits. Both lungs are clear. The visualized skeletal structures are unremarkable. IMPRESSION: No active cardiopulmonary disease. Electronically Signed   By: Kathreen Devoid   On: 12/19/2018 20:47   Ct Abdomen Pelvis W Contrast  Result Date: 12/19/2018 CLINICAL DATA:  History of multiple myeloma. Back pain. Abdominal distention. EXAM: CT ABDOMEN AND PELVIS WITH CONTRAST TECHNIQUE: Multidetector CT imaging of the abdomen and pelvis was performed using the standard protocol following bolus administration of intravenous contrast. CONTRAST:  59m ISOVUE-300 IOPAMIDOL (ISOVUE-300) INJECTION 61% COMPARISON:  None. FINDINGS: Lower chest: Airspace opacities are noted in the lung bases including right middle lobe, lingula and both lower lobes. This could reflect atelectasis or scarring. Heart is borderline in size. No effusions. Hepatobiliary: Small hypodensity anteriorly in the right hepatic lobe, likely small cyst. No suspicious focal hepatic abnormality or biliary ductal dilatation. Gallbladder unremarkable. Pancreas: No focal abnormality or ductal dilatation. Spleen: No focal abnormality.  Normal size. Adrenals/Urinary Tract: No adrenal abnormality. No focal renal abnormality. No stones or hydronephrosis. Urinary bladder is unremarkable. Stomach/Bowel: Large stool burden throughout the colon. Mild gaseous distention of the colon. No evidence of bowel obstruction. Vascular/Lymphatic: Aortic atherosclerosis. No enlarged abdominal or pelvic lymph nodes. Reproductive: No adnexal mass. Other:  No free fluid or free air. Musculoskeletal: 3.2 cm expansile lytic destructive lesion noted in the right sacrum multiple compression deformities throughout the thoracolumbar spine including T11, T12, L1 and L3. IMPRESSION: Large stool burden throughout the colon compatible with constipation. Large amount of stool in the rectosigmoid colon, cannot exclude early fecal impaction. Lytic destructive expansile lesion in the right sacral ala. Numerous compression fractures in the lower thoracic and lumbar spine. Findings likely represent known myeloma. Bibasilar atelectasis or scarring. Aortic atherosclerosis. Electronically Signed   By: KRolm BaptiseM.D.   On: 12/19/2018 22:17   Nm Pet Image Initial (pi) Whole Body  Result Date: 12/22/2018 CLINICAL DATA:  Initial treatment strategy for multiple myeloma. EXAM: NUCLEAR MEDICINE PET WHOLE BODY TECHNIQUE: 6.1 mCi F-18 FDG was  injected intravenously. Full-ring PET imaging was performed from the skull base to thigh after the radiotracer. CT data was obtained and used for attenuation correction and anatomic localization. Fasting blood glucose: 135 mg/dl COMPARISON:  CT chest 12/01/2018 FINDINGS: Mediastinal blood pool activity: SUV max 1.8 HEAD/NECK: No hypermetabolic activity in the scalp. No hypermetabolic cervical lymph nodes. Incidental CT findings: none CHEST: No hypermetabolic mediastinal or hilar nodes. 1.9 cm calcified left thyroid nodule is hypermetabolic with SUV max = 7. 1.5 x 1.8 cm irregular focal nodular opacity in the lingula is hypermetabolic with SUV max = 6.3. Other ill-defined nodules in the posterior lingula measuring up to 9 mm (88/4) are also hypermetabolic. Small pleural lesion along the right heart border is hypermetabolic with SUV max = 2.8 Incidental CT findings: Atelectasis noted both lower lobes. ABDOMEN/PELVIS: No abnormal hypermetabolic activity within the liver, pancreas, adrenal glands, or spleen. No hypermetabolic lymph nodes in the abdomen  or pelvis. Incidental CT findings: Marked stool volume and gaseous distention of colon noted from the cecum to the rectum. There is abdominal aortic atherosclerosis without aneurysm. 10 mm hyperdense lesion interpolar right kidney without hypermetabolism on PET imaging, likely complex cyst SKELETON: Numerous hypermetabolic bone lesions are identified in the axial and appendicular skeleton. 2.3 cm soft tissue nodule anterior right fourth rib is hypermetabolic with SUV max = 9.3. A 3.2 x 2.9 cm soft tissue lesion upper right sacrum is hypermetabolic with SUV max = 77.4. index 1.5 cm lesion proximal right femur demonstrates SUV max = 5.7. Incidental CT findings: Bones are diffusely demineralized with heterogeneous mineralization throughout the thoracolumbar spine. Multilevel thoracic compression fracture suspected, but not well evaluated on this axial only study. EXTREMITIES: No hypermetabolic soft tissue lesions in the extremities. Incidental CT findings: none IMPRESSION: 1. Numerous hypermetabolic bone lesions throughout the axial and appendicular skeleton consistent with the history of multiple myeloma. 2. Areas of hypermetabolic disease identified in both lungs suggesting multiple myeloma involvement. 3. 1.9 cm calcified left thyroid nodule is hypermetabolic, but indeterminate. 4. Colon is diffusely distended with gas and stool. Imaging features would be compatible with clinical constipation. 5.  Aortic Atherosclerois (ICD10-170.0) Electronically Signed   By: Misty Stanley M.D.   On: 12/22/2018 17:43    ASSESSMENT & PLAN:  78 y.o. female with  1. Multiple Myeloma with IgG Lambda specificity Labs upon initial presentation from 12/01/18 CBC w/diff revealed HGB at 10.4 with MCV of 105.7. 11/28/18 CMP revealed Creatinine normal at 0.68 and Calcium normal at 8.9. 11/28/18 Beta 2 microglobulin at 2.'6mg'$  (also a reading at 4.'7mg'$  on the same day). 11/30/18 24HR UPEP revealed M spike at 75.1%. 11/28/18 SPEP revealed M  spike of 4.6g. 11/28/18 Immunoglobulins revealed IgG elevated at '6181mg'$ .  12/01/18 BM Bx revealed hypercellular bone marrow with 70-80% CD138 immunohistochemistry (44% aspirate) lambda-restricted plasma cells as well as a kappa restricted population of B-cells 11/27/18 MRI Thoracic Spine which revealed Multifocal marrow signal abnormality consistent with metastatic disease or multiple myeloma. The patient has multiple compression fractures most consistent with pathologic injuries. Extensive marrow signal abnormality makes determining age of the fractures difficult but edema is most intense in T3. 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  2. Areas of hypermetabolic disease identified in both lungs suggesting multiple myeloma involvement. 3. 1.9 cm calcified left thyroid nodule is hypermetabolic, but Indeterminate. 4. Colon is diffusely distended with gas and stool. Imaging features would be compatible with clinical constipation. 5.  Aortic Atherosclerois.  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 She does not currently have elevated lymphocyte counts on peripheral blood draws.  PLAN: -Discussed pt labwork today, 01/17/19; HGB improved to 10.0, WBC normalized to 5.2k. PLT normalized to 283k. Albumin improved to 3.1. -Discussed the 01/10/19 MMP which revealed M Protein improved to 2.0g from 4.3g prior to treatment. IgG improved to 2756. -Pt was able to tolerate Revlimid re-challenge  with optimization of premeds and did not develop a rash -The pt has no prohibitive toxicities from continuing C2D8 Velcade, Dexamethasone, and Revlimid at this time. -Continue Pepcid and Claritin. prn Hydroxyzine if rash presents again. -Recommend that pt call the PT she was connected with through her at-home care service -Continue to focus on in home PT and nutrition -Will order [place] of thyroid in 1-2 months -Discussed that I do not recommend a biopsy of the left lung nodule at this point, but will look for response in this area as treatment continues. Will monitor and discussed that I cannot rule out a primary lung cancer at this point, but that this is likely related to her myeloma. -I did contact her glaucoma specialist, Dr. Edilia Bo, regarding the standard treatment recommendation of '40mg'$  Dexamethasone weekly. Pt will be seeing Dr. Edilia Bo again in 2 weeks for further evaluation. -Continue leg elevation for leg swelling and leg wrapping with [facility] -Continue low dose Lasix and 9mq Potassium replacement -Advised that pt weigh herself while on Lasix -Continue '81mg'$  aspirin -Continue '400mg'$  Acyclovir BID -Continue 228m/hr Fentanyl patch to smoothen pain control -Continue Oxycodone 1-2 pills every 4-6 hours as needed -Continue two tablets of Senna S at night time and daily Miralax, backing off if diarrhea develops or OTC Fleet enema -Continue Xgeva every 4 weeks, pt denies current dental concerns -Pt has [facility] care [facility] the pt eat very well and continue to stay well hydrate, and stay as active as she can reasonably manage to -Will see the pt back in 2 weeks   F/u as per currently scheduled appointments for C2D11 and C3 Continue Xgeva q4 weeks   All of the patients questions were answered with apparent satisfaction. The patient knows to call the clinic with any problems, questions or concerns.  The total time spent in the appt was 30 minutes  and more than 50% was on counseling and direct patient cares.    GaSullivan LoneD MS AAHIVMS SCVa [facility] Diego Healthcare SystemTBoston Children'S Hospitalematology/Oncology Physician CoAspirus Medford Hospital & Clinics, Inc(Office):       [phone] cell):  [phone]):           [phone]/14/2020 2:14 PM  I, ScBaldwin Jamaicaam acting as a scribe for Dr. GaSullivan Lone  .I have reviewed the above documentation for accuracy and completeness, and I agree with the above. .GBrunetta GeneraD

## 2019-01-17 NOTE — Patient Instructions (Signed)
[facility] Discharge Instructions for Patients Receiving Chemotherapy  Today you received the following chemotherapy agents:  Velcade  To help prevent nausea and vomiting after your treatment, we encourage you to take your nausea medication as prescribed.   If you develop nausea and vomiting that is not controlled by your nausea medication, call the clinic.   BELOW ARE SYMPTOMS THAT SHOULD BE REPORTED IMMEDIATELY:  *FEVER GREATER THAN 100.5 F  *CHILLS WITH OR WITHOUT FEVER  NAUSEA AND VOMITING THAT IS NOT CONTROLLED WITH YOUR NAUSEA MEDICATION  *UNUSUAL SHORTNESS OF BREATH  *UNUSUAL BRUISING OR BLEEDING  TENDERNESS IN MOUTH AND THROAT WITH OR WITHOUT PRESENCE OF ULCERS  *URINARY PROBLEMS  *BOWEL PROBLEMS  UNUSUAL RASH Items with * indicate a potential emergency and should be followed up as soon as possible.  Feel free to call the clinic should you have any questions or concerns. The clinic phone number is [phone].  Please show the [facility] at check-in to the Emergency Department and triage nurse.  Denosumab injection What is this medicine? DENOSUMAB (den oh sue mab) slows bone breakdown. Prolia is used to treat osteoporosis in women after menopause and in men, and in people who are taking corticosteroids for 6 months or more. Delton See is used to treat a high calcium level due to cancer and to prevent bone fractures and other bone problems caused by multiple myeloma or cancer bone metastases. Delton See is also used to treat giant cell tumor of the bone. This medicine may be used for other purposes; ask your health care provider or pharmacist if you have questions. COMMON BRAND NAME(S): Prolia, XGEVA What should I tell my health care provider before I take this medicine? They need to know if you have any of these conditions: -dental disease -having surgery or tooth extraction -infection -kidney disease -low levels of calcium or Vitamin D in the  blood -malnutrition -on hemodialysis -skin conditions or sensitivity -thyroid or parathyroid disease -an unusual reaction to denosumab, other medicines, foods, dyes, or preservatives -pregnant or trying to get pregnant -breast-feeding How should I use this medicine? This medicine is for injection under the skin. It is given by a health care professional in a hospital or clinic setting. A special MedGuide will be given to you before each treatment. Be sure to read this information carefully each time. For Prolia, talk to your pediatrician regarding the use of this medicine in children. Special care may be needed. For Delton See, talk to your pediatrician regarding the use of this medicine in children. While this drug may be prescribed for children as young as 13 years for selected conditions, precautions do apply. Overdosage: If you think you have taken too much of this medicine contact a poison control center or emergency room at once. NOTE: This medicine is only for you. Do not share this medicine with others. What if I miss a dose? It is important not to miss your dose. Call your doctor or health care professional if you are unable to keep an appointment. What may interact with this medicine? Do not take this medicine with any of the following medications: -other medicines containing denosumab This medicine may also interact with the following medications: -medicines that lower your chance of fighting infection -steroid medicines like prednisone or cortisone This list may not describe all possible interactions. Give your health care provider a list of all the medicines, herbs, non-prescription drugs, or dietary supplements you use. Also tell them if you smoke, drink alcohol,  or use illegal drugs. Some items may interact with your medicine. What should I watch for while using this medicine? Visit your doctor or health care professional for regular checks on your progress. Your doctor or health  care professional may order blood tests and other tests to see how you are doing. Call your doctor or health care professional for advice if you get a fever, chills or sore throat, or other symptoms of a cold or flu. Do not treat yourself. This drug may decrease your body's ability to fight infection. Try to avoid being around people who are sick. You should make sure you get enough calcium and vitamin D while you are taking this medicine, unless your doctor tells you not to. Discuss the foods you eat and the vitamins you take with your health care professional. See your dentist regularly. Brush and floss your teeth as directed. Before you have any dental work done, tell your dentist you are receiving this medicine. Do not become pregnant while taking this medicine or for 5 months after stopping it. Talk with your doctor or health care professional about your birth control options while taking this medicine. Women should inform their doctor if they wish to become pregnant or think they might be pregnant. There is a potential for serious side effects to an unborn child. Talk to your health care professional or pharmacist for more information. What side effects may I notice from receiving this medicine? Side effects that you should report to your doctor or health care professional as soon as possible: -allergic reactions like skin rash, itching or hives, swelling of the face, lips, or tongue -bone pain -breathing problems -dizziness -jaw pain, especially after dental work -redness, blistering, peeling of the skin -signs and symptoms of infection like fever or chills; cough; sore throat; pain or trouble passing urine -signs of low calcium like fast heartbeat, muscle cramps or muscle pain; pain, tingling, numbness in the hands or feet; seizures -unusual bleeding or bruising -unusually weak or tired Side effects that usually do not require medical attention (report to your doctor or health care  professional if they continue or are bothersome): -constipation -diarrhea -headache -joint pain -loss of appetite -muscle pain -runny nose -tiredness -upset stomach This list may not describe all possible side effects. Call your doctor for medical advice about side effects. You may report side effects to FDA at 1-800-FDA-1088. Where should I keep my medicine? This medicine is only given in a clinic, doctor's office, or other health care setting and will not be stored at home. NOTE: This sheet is a summary. It may not cover all possible information. If you have questions about this medicine, talk to your doctor, pharmacist, or health care provider.  2019 Elsevier/Gold Standard (2018-01-28 16:10:44)   Coronavirus (COVID-19) Are you at risk?  Are you at risk for the Coronavirus (COVID-19)?  To be considered HIGH RISK for Coronavirus (COVID-19), you have to meet the following criteria:  . Traveled to [place], [place], [place], [place] or [place]; or in the [place] to [place], [place], [place], or [place]; and have fever, cough, and shortness of breath within the last 2 weeks of travel OR . Been in close contact with a person diagnosed with COVID-19 within the last 2 weeks and have fever, cough, and shortness of breath . IF YOU DO NOT MEET THESE CRITERIA, YOU ARE CONSIDERED LOW RISK FOR COVID-19.  What to do if you are HIGH RISK for COVID-19?  Marland Kitchen If  you are having a medical emergency, call 911. . Seek medical care right away. Before you go to a doctor's office, urgent care or emergency department, call ahead and tell them about your recent travel, contact with someone diagnosed with COVID-19, and your symptoms. You should receive instructions from your physician's office regarding next steps of care.  . When you arrive at healthcare provider, tell the healthcare staff immediately you have returned from visiting [place], [place], [place], [place] or [place]; or traveled in the [place] to [place], [place], [place], or [place]; in the last two weeks or you have been in close contact with a person diagnosed with COVID-19 in the last 2 weeks.   . Tell the health care staff about your symptoms: fever, cough and shortness of breath. . After you have been seen by a medical provider, you will be either: o Tested for (COVID-19) and discharged home on quarantine except to seek medical care if symptoms worsen, and asked to  - Stay home and avoid contact with others until you get your results (4-5 days)  - Avoid travel on public transportation if possible (such as bus, train, or airplane) or o Sent to the Emergency Department by EMS for evaluation, COVID-19 testing, and possible admission depending on your condition and test results.  What to do if you are LOW RISK for COVID-19?  Reduce your risk of any infection by using the same precautions used for avoiding the common cold or flu:  Marland Kitchen Wash your hands often with soap and warm water for at least 20 seconds.  If soap and water are not readily available, use an alcohol-based hand sanitizer with at least 60% alcohol.  . If coughing or sneezing, cover your mouth and nose by coughing or sneezing into the elbow areas of your shirt or coat, into a tissue or into your sleeve (not your hands). . Avoid shaking hands with others and consider head nods or verbal greetings only. . Avoid touching your eyes, nose, or mouth with unwashed hands.  . Avoid close contact with people who are sick. . Avoid places or events with large numbers of people in one location, like concerts or sporting events. . Carefully consider travel plans you have or are making. . If you are planning any travel outside or inside the [place], visit the CDC's Travelers' Health webpage for the latest health notices. . If you have some symptoms but not all symptoms, continue to monitor at home and seek medical attention if your symptoms worsen. . If you are having a  medical emergency, call 911.   [facility] / e-Visit: [email or web address]         [facility]: [facility]: [phone]                   [facility]: [phone]

## 2019-01-18 ENCOUNTER — Other Ambulatory Visit: Payer: Self-pay | Admitting: *Deleted

## 2019-01-18 ENCOUNTER — Telehealth: Payer: Self-pay | Admitting: Hematology

## 2019-01-18 DIAGNOSIS — Z7189 Other specified counseling: Secondary | ICD-10-CM

## 2019-01-18 DIAGNOSIS — C9000 Multiple myeloma not having achieved remission: Secondary | ICD-10-CM

## 2019-01-18 MED ORDER — LENALIDOMIDE 15 MG PO CAPS: 15.0000 mg | ORAL_CAPSULE | Freq: Every day | ORAL | 0 refills | Status: DC

## 2019-01-18 NOTE — Telephone Encounter (Signed)
Revlimid refilled per office visit note 01/17/2019. Refill sent to [facility], [place] - [id number]. [facility]#[id number], 01/18/2019.

## 2019-01-18 NOTE — Telephone Encounter (Signed)
Scheduled appt per 4/14 los. °

## 2019-01-20 ENCOUNTER — Other Ambulatory Visit: Payer: Self-pay

## 2019-01-20 ENCOUNTER — Inpatient Hospital Stay: Payer: Medicare HMO

## 2019-01-20 ENCOUNTER — Telehealth: Payer: Self-pay | Admitting: *Deleted

## 2019-01-20 VITALS — BP 146/76 | HR 71 | Temp 97.6°F | Resp 18

## 2019-01-20 DIAGNOSIS — D72820 Lymphocytosis (symptomatic): Secondary | ICD-10-CM | POA: Diagnosis not present

## 2019-01-20 DIAGNOSIS — C9000 Multiple myeloma not having achieved remission: Secondary | ICD-10-CM | POA: Diagnosis not present

## 2019-01-20 DIAGNOSIS — Z87891 Personal history of nicotine dependence: Secondary | ICD-10-CM | POA: Diagnosis not present

## 2019-01-20 DIAGNOSIS — Z5111 Encounter for antineoplastic chemotherapy: Secondary | ICD-10-CM | POA: Diagnosis not present

## 2019-01-20 DIAGNOSIS — K5900 Constipation, unspecified: Secondary | ICD-10-CM | POA: Diagnosis not present

## 2019-01-20 DIAGNOSIS — M549 Dorsalgia, unspecified: Secondary | ICD-10-CM | POA: Diagnosis not present

## 2019-01-20 DIAGNOSIS — Z7189 Other specified counseling: Secondary | ICD-10-CM

## 2019-01-20 DIAGNOSIS — Z79899 Other long term (current) drug therapy: Secondary | ICD-10-CM | POA: Diagnosis not present

## 2019-01-20 MED ORDER — PROCHLORPERAZINE MALEATE 10 MG PO TABS
10.0000 mg | ORAL_TABLET | Freq: Once | ORAL | Status: AC
  Administered 2019-01-20: 11:00:00 10 mg via ORAL

## 2019-01-20 MED ORDER — BORTEZOMIB CHEMO SQ INJECTION 3.5 MG (2.5MG/ML)
1.3000 mg/m2 | Freq: Once | INTRAMUSCULAR | Status: AC
  Administered 2019-01-20: 2 mg via SUBCUTANEOUS
  Filled 2019-01-20: qty 0.8

## 2019-01-20 MED ORDER — PROCHLORPERAZINE MALEATE 10 MG PO TABS
ORAL_TABLET | ORAL | Status: AC
  Filled 2019-01-20: qty 1

## 2019-01-20 NOTE — Patient Instructions (Signed)
[facility] Discharge Instructions for Patients Receiving Chemotherapy  Today you received the following chemotherapy agents:  Velcade  To help prevent nausea and vomiting after your treatment, we encourage you to take your nausea medication as prescribed.   If you develop nausea and vomiting that is not controlled by your nausea medication, call the clinic.   BELOW ARE SYMPTOMS THAT SHOULD BE REPORTED IMMEDIATELY:  *FEVER GREATER THAN 100.5 F  *CHILLS WITH OR WITHOUT FEVER  NAUSEA AND VOMITING THAT IS NOT CONTROLLED WITH YOUR NAUSEA MEDICATION  *UNUSUAL SHORTNESS OF BREATH  *UNUSUAL BRUISING OR BLEEDING  TENDERNESS IN MOUTH AND THROAT WITH OR WITHOUT PRESENCE OF ULCERS  *URINARY PROBLEMS  *BOWEL PROBLEMS  UNUSUAL RASH Items with * indicate a potential emergency and should be followed up as soon as possible.  Feel free to call the clinic should you have any questions or concerns. The clinic phone number is [phone].  Please show the [facility] at check-in to the Emergency Department and triage nurse.  Denosumab injection What is this medicine? DENOSUMAB (den oh sue mab) slows bone breakdown. Prolia is used to treat osteoporosis in women after menopause and in men, and in people who are taking corticosteroids for 6 months or more. Delton See is used to treat a high calcium level due to cancer and to prevent bone fractures and other bone problems caused by multiple myeloma or cancer bone metastases. Delton See is also used to treat giant cell tumor of the bone. This medicine may be used for other purposes; ask your health care provider or pharmacist if you have questions. COMMON BRAND NAME(S): Prolia, XGEVA What should I tell my health care provider before I take this medicine? They need to know if you have any of these conditions: -dental disease -having surgery or tooth extraction -infection -kidney disease -low levels of calcium or Vitamin D in the  blood -malnutrition -on hemodialysis -skin conditions or sensitivity -thyroid or parathyroid disease -an unusual reaction to denosumab, other medicines, foods, dyes, or preservatives -pregnant or trying to get pregnant -breast-feeding How should I use this medicine? This medicine is for injection under the skin. It is given by a health care professional in a hospital or clinic setting. A special MedGuide will be given to you before each treatment. Be sure to read this information carefully each time. For Prolia, talk to your pediatrician regarding the use of this medicine in children. Special care may be needed. For Delton See, talk to your pediatrician regarding the use of this medicine in children. While this drug may be prescribed for children as young as 13 years for selected conditions, precautions do apply. Overdosage: If you think you have taken too much of this medicine contact a poison control center or emergency room at once. NOTE: This medicine is only for you. Do not share this medicine with others. What if I miss a dose? It is important not to miss your dose. Call your doctor or health care professional if you are unable to keep an appointment. What may interact with this medicine? Do not take this medicine with any of the following medications: -other medicines containing denosumab This medicine may also interact with the following medications: -medicines that lower your chance of fighting infection -steroid medicines like prednisone or cortisone This list may not describe all possible interactions. Give your health care provider a list of all the medicines, herbs, non-prescription drugs, or dietary supplements you use. Also tell them if you smoke, drink alcohol,  or use illegal drugs. Some items may interact with your medicine. What should I watch for while using this medicine? Visit your doctor or health care professional for regular checks on your progress. Your doctor or health  care professional may order blood tests and other tests to see how you are doing. Call your doctor or health care professional for advice if you get a fever, chills or sore throat, or other symptoms of a cold or flu. Do not treat yourself. This drug may decrease your body's ability to fight infection. Try to avoid being around people who are sick. You should make sure you get enough calcium and vitamin D while you are taking this medicine, unless your doctor tells you not to. Discuss the foods you eat and the vitamins you take with your health care professional. See your dentist regularly. Brush and floss your teeth as directed. Before you have any dental work done, tell your dentist you are receiving this medicine. Do not become pregnant while taking this medicine or for 5 months after stopping it. Talk with your doctor or health care professional about your birth control options while taking this medicine. Women should inform their doctor if they wish to become pregnant or think they might be pregnant. There is a potential for serious side effects to an unborn child. Talk to your health care professional or pharmacist for more information. What side effects may I notice from receiving this medicine? Side effects that you should report to your doctor or health care professional as soon as possible: -allergic reactions like skin rash, itching or hives, swelling of the face, lips, or tongue -bone pain -breathing problems -dizziness -jaw pain, especially after dental work -redness, blistering, peeling of the skin -signs and symptoms of infection like fever or chills; cough; sore throat; pain or trouble passing urine -signs of low calcium like fast heartbeat, muscle cramps or muscle pain; pain, tingling, numbness in the hands or feet; seizures -unusual bleeding or bruising -unusually weak or tired Side effects that usually do not require medical attention (report to your doctor or health care  professional if they continue or are bothersome): -constipation -diarrhea -headache -joint pain -loss of appetite -muscle pain -runny nose -tiredness -upset stomach This list may not describe all possible side effects. Call your doctor for medical advice about side effects. You may report side effects to FDA at 1-800-FDA-1088. Where should I keep my medicine? This medicine is only given in a clinic, doctor's office, or other health care setting and will not be stored at home. NOTE: This sheet is a summary. It may not cover all possible information. If you have questions about this medicine, talk to your doctor, pharmacist, or health care provider.  2019 Elsevier/Gold Standard (2018-01-28 16:10:44)   Coronavirus (COVID-19) Are you at risk?  Are you at risk for the Coronavirus (COVID-19)?  To be considered HIGH RISK for Coronavirus (COVID-19), you have to meet the following criteria:  . Traveled to [place], [place], [place], [place] or [place]; or in the [place] to [place], [place], [place], or [place]; and have fever, cough, and shortness of breath within the last 2 weeks of travel OR . Been in close contact with a person diagnosed with COVID-19 within the last 2 weeks and have fever, cough, and shortness of breath . IF YOU DO NOT MEET THESE CRITERIA, YOU ARE CONSIDERED LOW RISK FOR COVID-19.  What to do if you are HIGH RISK for COVID-19?  Marland Kitchen If  you are having a medical emergency, call 911. . Seek medical care right away. Before you go to a doctor's office, urgent care or emergency department, call ahead and tell them about your recent travel, contact with someone diagnosed with COVID-19, and your symptoms. You should receive instructions from your physician's office regarding next steps of care.  . When you arrive at healthcare provider, tell the healthcare staff immediately you have returned from visiting [place], [place], [place], [place] or [place]; or traveled in the [place] to [place], [place], [place], or [place]; in the last two weeks or you have been in close contact with a person diagnosed with COVID-19 in the last 2 weeks.   . Tell the health care staff about your symptoms: fever, cough and shortness of breath. . After you have been seen by a medical provider, you will be either: o Tested for (COVID-19) and discharged home on quarantine except to seek medical care if symptoms worsen, and asked to  - Stay home and avoid contact with others until you get your results (4-5 days)  - Avoid travel on public transportation if possible (such as bus, train, or airplane) or o Sent to the Emergency Department by EMS for evaluation, COVID-19 testing, and possible admission depending on your condition and test results.  What to do if you are LOW RISK for COVID-19?  Reduce your risk of any infection by using the same precautions used for avoiding the common cold or flu:  Marland Kitchen Wash your hands often with soap and warm water for at least 20 seconds.  If soap and water are not readily available, use an alcohol-based hand sanitizer with at least 60% alcohol.  . If coughing or sneezing, cover your mouth and nose by coughing or sneezing into the elbow areas of your shirt or coat, into a tissue or into your sleeve (not your hands). . Avoid shaking hands with others and consider head nods or verbal greetings only. . Avoid touching your eyes, nose, or mouth with unwashed hands.  . Avoid close contact with people who are sick. . Avoid places or events with large numbers of people in one location, like concerts or sporting events. . Carefully consider travel plans you have or are making. . If you are planning any travel outside or inside the [place], visit the CDC's Travelers' Health webpage for the latest health notices. . If you have some symptoms but not all symptoms, continue to monitor at home and seek medical attention if your symptoms worsen. . If you are having a  medical emergency, call 911.   [facility] / e-Visit: [email or web address]         [facility]: [facility]: [phone]                   [facility]: [phone]

## 2019-01-20 NOTE — Telephone Encounter (Signed)
Spoke with [place], PT @ [facility].  Gave her verbal order to extend PT weekly x 3 more weeks as ok per Dr. Irene Limbo. Jeanine voiced understanding. Jeanine's  Phone   [phone].

## 2019-01-23 DIAGNOSIS — H401122 Primary open-angle glaucoma, left eye, moderate stage: Secondary | ICD-10-CM | POA: Diagnosis not present

## 2019-01-23 DIAGNOSIS — H401112 Primary open-angle glaucoma, right eye, moderate stage: Secondary | ICD-10-CM | POA: Diagnosis not present

## 2019-01-24 ENCOUNTER — Other Ambulatory Visit: Payer: Self-pay | Admitting: *Deleted

## 2019-01-24 NOTE — Telephone Encounter (Signed)
Patient called to ask for pain med refill stating has about 3 days left.

## 2019-01-25 ENCOUNTER — Other Ambulatory Visit: Payer: Self-pay | Admitting: Hematology

## 2019-01-25 MED ORDER — FENTANYL 25 MCG/HR TD PT72: 1.0000 | MEDICATED_PATCH | TRANSDERMAL | 0 refills | Status: DC

## 2019-01-25 MED ORDER — OXYCODONE-ACETAMINOPHEN 5-325 MG PO TABS: 1.0000 | ORAL_TABLET | ORAL | 0 refills | Status: DC | PRN

## 2019-01-26 DIAGNOSIS — Z7952 Long term (current) use of systemic steroids: Secondary | ICD-10-CM | POA: Diagnosis not present

## 2019-01-26 DIAGNOSIS — M8458XD Pathological fracture in neoplastic disease, other specified site, subsequent encounter for fracture with routine healing: Secondary | ICD-10-CM | POA: Diagnosis not present

## 2019-01-26 DIAGNOSIS — R609 Edema, unspecified: Secondary | ICD-10-CM | POA: Diagnosis not present

## 2019-01-26 DIAGNOSIS — I10 Essential (primary) hypertension: Secondary | ICD-10-CM | POA: Diagnosis not present

## 2019-01-26 DIAGNOSIS — G893 Neoplasm related pain (acute) (chronic): Secondary | ICD-10-CM | POA: Diagnosis not present

## 2019-01-26 DIAGNOSIS — D539 Nutritional anemia, unspecified: Secondary | ICD-10-CM | POA: Diagnosis not present

## 2019-01-26 DIAGNOSIS — D72820 Lymphocytosis (symptomatic): Secondary | ICD-10-CM | POA: Diagnosis not present

## 2019-01-26 DIAGNOSIS — C9000 Multiple myeloma not having achieved remission: Secondary | ICD-10-CM | POA: Diagnosis not present

## 2019-01-26 DIAGNOSIS — M8580 Other specified disorders of bone density and structure, unspecified site: Secondary | ICD-10-CM | POA: Diagnosis not present

## 2019-01-26 DIAGNOSIS — H409 Unspecified glaucoma: Secondary | ICD-10-CM | POA: Diagnosis not present

## 2019-01-27 ENCOUNTER — Telehealth: Payer: Self-pay | Admitting: *Deleted

## 2019-01-27 NOTE — Telephone Encounter (Signed)
Received voice mail from [place], Physical Therapist w/[facility]. 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 Per Tharon Aquas, patient and spouse have been instructed on care of current injury.

## 2019-01-30 NOTE — Progress Notes (Signed)
HEMATOLOGY/ONCOLOGY CONSULTATION NOTE  Date of Service: 01/31/2019  Patient Care Team: [name], MD as PCP - General (Family Medicine)  Dr. Ronney Lion as [place] Specialist  [phone]  CHIEF COMPLAINTS/PURPOSE OF CONSULTATION:  Multiple Myeloma- continued management  HISTORY OF PRESENTING ILLNESS:   Megan Olson is a wonderful 78 y.o. female who has been referred to [place] by Dr. 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 She is accompanied today by her husband. The pt reports that she is doing well overall.   The pt notes that she was doing extensive yard work in October 2019, and began to feel back pain a few days after this, which she attributed to muscle pain. She recalls taking a deep breath, and developing sudden acute pain, and presented to care with her PCP's office. She began exercises for her back pain, without relief, then began PT without relief, then was referred to Dr. Paulla Fore in sports medicine in late January 2020. She had an XR which revealed a compression fracture at T11, then subsequently had an MRI, and a bone marrow biopsy. The pt notes that her back pain "seemed to move around." She endorses pain "up the whole left side" of her back.  The pt notes that she is not able to stand up straight due to her back pain, which she feels limits her ability to take a deep breath, and endorses pain exacerbation when she does take a deep breath. The pt needs assistance from sitting to lying from her husband. She is using about 3 Percocet a day.  The pt notes worsened constipation since beginning Percocet, and notes that her most recent laxative order was not covered by her insurance. She is using prune juice and milk of magnesia. She took Senokot S BID without relief.  The pt reports that prior to her recent back pain, she had very few medical concerns. She endorses controlled BP, and has been monitored for DM  but after closely watching her diet her A1C decreased to 5.8. She has glaucoma, and has had surgery in both eyes. Right eye with stent. The pt sees Dr. Edilia Bo at [facility] for her eye care. The pt notes that her vision has been recently "pretty good." The pt notes that she has been advised to limit treatment with steroids for her glaucoma. She denies heart or lung problems. Denies previous back problems. Last DEXA scan 3 years ago, and endorses osteopenia. She notes that she took Fosamax for 3-4 years, and stopped 5-6 years ago. She takes Vitamin D, a multivitamin, and magnesium.  The pt notes that she quit smoking cigarettes when she was 27, started when she was about 20. The pt consumes alcohol rarely, but not since beginning narcotics. 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  Of note prior to the patient's visit today, pt has had an MRI Thoracic Spine completed on 11/27/18 with results revealing Multifocal marrow signal abnormality consistent with metastatic disease or multiple myeloma. The patient has multiple compression fractures most consistent with pathologic injuries. Extensive marrow signal abnormality makes determining age of the fractures difficult but edema is most intense in T3. 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  Most recent lab results (12/01/18) of CBC w/diff is as follows: all values are WNL except for RBC at 3.17, HGB at 10.4, HCT at 33.5, MCV at 105.7.  11/28/18 CMP revealed all values WNL except for Glucose at 105, Total Protein at 10.2, Albumin at 3.1 11/30/18 24HR UPEP revealed all values WNL except for M-spike at 75.1%. 11/28/18 Bega-2 microglobulin slightly elevated at 2.6 11/28/18 SPEP revealed M spike at 4.6g 11/28/18 Immunoglobulins revealed IgG at 6181, IgA at 24, IgM at 16, and IgE at 6.  On review of systems, pt reports constipation, back pain, stable energy levels, ankle swelling,  tenderness at T3, lower back pain, and denies abdominal pains, neck pain, and any other symptoms.   On PMHx the pt reports redundant colon, single hemorrhoid, glaucoma, HTN, osteopenia, Tonsillectomy, right eye stent and multiple eye surgeries. On Social Hx the pt reports rare alcohol use, smoked cigarettes between ages 81 and 47. 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 On Family Hx the pt reports sister died from small cell lung cancer and was a lifelong smoker, brother's daughter died of breast cancer with BRCA1 and BRCA2 mutations. Cousin with female breast cancer.  Interval History:   Megan Olson returns today for management and evaluation of her recently diagnosed multiple myeloma. The patient's last visit with [place] was on 01/17/19. The pt reports that she is doing well overall.  The pt reports that she has been having less pain overall, and has begun more exercises with her physical therapist. She notes that she had some soreness after doing mild twisting side to side exercises. The pt notes that she is taking 5 break through medications a day, with pain centered in the lower back. She notes that she has been able to move to walking with the assistance of her husband, but is mostly ambulating with a walker which she notes is improved. She endorses improved energy levels. The pt notes that she is having less muscle spasms as well. She also notes that her ankle swelling has improved and continues on lasix.  The pt notes that her rash has not recurred and she has continued on antihistamines.  Lab results today (01/31/19) of CBC w/diff and CMP is as follows: all values are WNL except for RBC at 3.34, HGB at 10.8, HCT at 34.0, MCV at 101.8, PLT at 428k, Glucose at 100, Calcium at 8.6, AST at 10. 01/31/19 MMP showed M spike down to 1.2 (pretreatment 4.3)  On review of systems, pt reports improved energy levels, increased activity levels, improved pain control, improved ankle swelling, moving her bowels well,  and denies skin rashes, fevers, SOB, concerns for infections, tingling or numbness in her hands or feet, mouth sores, and any other symptoms.    MEDICAL HISTORY:  Past Medical History:  Diagnosis Date  . Cancer ([place])    multiple myeloma  . Glaucoma   . HYPERTENSION 03/11/2007  . OSTEOPENIA 03/11/2007    SURGICAL HISTORY: Past Surgical History:  Procedure Laterality Date  . BREAST EXCISIONAL BIOPSY Right 2000  . BREAST LUMPECTOMY  1990   benign  . DILATION AND CURETTAGE OF UTERUS     bleeding at menopause. No uterine cancer  . IR RADIOLOGIST EVAL & MGMT  12/13/2018  . TONSILLECTOMY     age 59    SOCIAL HISTORY: Social History   Socioeconomic History  . Marital status: Married    Spouse name: Not on file  . Number of children: Not on file  . Years of education: Not on file  . Highest education level: Not on file  Occupational History  . Not on file  Social Needs  . Financial resource strain: Not on  file  . Food insecurity:    Worry: Not on file    Inability: Not on file  . Transportation needs:    Medical: Not on file    Non-medical: Not on file  Tobacco Use  . Smoking status: Former Smoker    Packs/day: 0.50    Years: 7.00    Pack years: 3.50    Types: Cigarettes    Last attempt to quit: 01/04/1961    Years since quitting: 58.1  . Smokeless tobacco: Never Used  Substance and Sexual Activity  . Alcohol use: Yes    Alcohol/week: 1.0 standard drinks    Types: 1 Standard drinks or equivalent per week  . Drug use: No  . Sexual activity: Not Currently  Lifestyle  . Physical activity:    Days per week: Not on file    Minutes per session: Not on file  . Stress: Not on file  Relationships  . Social connections:    Talks on phone: Not on file    Gets together: Not on file    Attends religious service: Not on file    Active member of club or organization: Not on file    Attends meetings of clubs or organizations: Not on file    Relationship status: Not on file  .  Intimate partner violence:    Fear of current or ex partner: Not on file    Emotionally abused: Not on file    Physically abused: Not on file    Forced sexual activity: Not on file  Other Topics Concern  . Not on file  Social History Narrative   Married. Lives with husband (patient of Dr. Yong Channel). 1 son. No grandkids. 1 granddog.       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 Heart disease Mother        CHF mother died 35  . Arthritis Mother   . Glaucoma Mother        sister as well  . Alcohol abuse Father   . Suicidality Father   . Cancer Sister        lung cancer, smoker  . Heart disease Sister        aortic valve replacement  . Hyperlipidemia Brother   . Hypertension Brother   . COPD Brother   . Arthritis Sister   . Hypertension Sister   . Glaucoma Sister   . Hashimoto's thyroiditis Sister   . Hypertension Son   . Stroke Maternal Grandmother   . Colon cancer Neg Hx   . Colon polyps Neg Hx     ALLERGIES:  is allergic to ace inhibitors; benadryl [diphenhydramine]; diamox [acetazolamide]; sulfamethoxazole; and penicillins.  MEDICATIONS:  Current Outpatient Medications  Medication Sig Dispense Refill  . acyclovir (ZOVIRAX) 400 MG tablet Take 1 tablet (400 mg total) by mouth 2 (two) times daily. 60 tablet 3  . ALPHAGAN P 0.1 % SOLN Apply 1 drop topically See admin instructions. Apply 2 drops in right eye 3 times a day    . amLODipine (NORVASC) 5 MG tablet Take 1 tablet (5 mg total) by mouth daily. 90 tablet 3  . aspirin EC 81 MG tablet Take 81 mg by mouth daily.    . bimatoprost (LUMIGAN) 0.03 % ophthalmic solution Place 1 drop into the  right eye at bedtime.     . Cholecalciferol (VITAMIN D3) 1000 UNITS CAPS Take 1,000 Units by mouth 2 (two) times daily.     Marland Kitchen co-enzyme Q-10 50 MG  capsule Take 50 mg by mouth daily.      Marland Kitchen dexamethasone (DECADRON) 4 MG tablet 72m (5 tabs) on Day 15 of each cycle of treatment 15 tablet 3  . dorzolamide-timolol (COSOPT) 22.3-6.8 MG/ML ophthalmic solution Place 2 drops into both eyes 2 (two) times daily.   11  . fentaNYL (DURAGESIC) 25 MCG/HR Place 1 patch onto the skin every 3 (three) days. 10 patch 0  . furosemide (LASIX) 20 MG tablet Take 1 tablet (20 mg total) by mouth daily. 30 tablet 1  . hydrOXYzine (ATARAX/VISTARIL) 25 MG tablet Take 1 tablet (25 mg total) by mouth 3 (three) times daily as needed. 30 tablet 0  . lenalidomide (REVLIMID) 15 MG capsule Take 1 capsule (15 mg total) by mouth daily. Take on days 1-14 of each 21 day cycle. Celgene aHXTA#[id number] capsule 0  . magnesium hydroxide (MILK OF MAGNESIA) 400 MG/5ML suspension Take 15 mLs by mouth daily as needed for mild constipation or moderate constipation. 355 mL 0  . Magnesium Oxide 500 MG TABS Take 1 tablet by mouth daily.      . Multiple Vitamins-Minerals (MULTIVITAMIN WITH MINERALS) tablet Take 1 tablet by mouth daily.      . ondansetron (ZOFRAN) 8 MG tablet Take 1 tablet (8 mg total) by mouth 2 (two) times daily as needed (Nausea or vomiting). 30 tablet 1  . oxyCODONE-acetaminophen (PERCOCET) 5-325 MG tablet Take 1-2 tablets by mouth every 4 (four) hours as needed for moderate pain or severe pain. 90 tablet 0  . polyethylene glycol (MIRALAX) packet Take 17 g by mouth daily. 30 each 1  . potassium chloride SA (K-DUR) 20 MEQ tablet Take 1 tablet (20 mEq total) by mouth daily. 30 tablet 1  . prochlorperazine (COMPAZINE) 10 MG tablet Take 1 tablet (10 mg total) by mouth every 6 (six) hours as needed (Nausea or vomiting). 30 tablet 1  . senna-docusate (SENOKOT-S) 8.6-50 MG tablet Take 2 tablets by mouth at bedtime. 60 tablet 2   No current facility-administered medications for this visit.     REVIEW OF SYSTEMS:    A 10+ POINT REVIEW OF SYSTEMS WAS OBTAINED including neurology,  dermatology, psychiatry, cardiac, respiratory, lymph, extremities, GI, GU, Musculoskeletal, constitutional, breasts, reproductive, HEENT.  All pertinent positives are noted in the HPI.  All others are negative.   PHYSICAL EXAMINATION: ECOG PERFORMANCE STATUS: 1-2  Vitals:   01/31/19 1133  BP: (!) 151/82  Pulse: 78  Resp: 16  Temp: 99.3 F (37.4 C)  SpO2: 99%   Filed Weights   01/31/19 1133  Weight: 108 lb 1.6 oz (49 kg)   .Body mass index is 19.46 kg/m.  GENERAL:alert, in no acute distress and comfortable SKIN: no acute rashes, no significant lesions EYES: conjunctiva are pink and non-injected, sclera anicteric OROPHARYNX: MMM, no exudates, no oropharyngeal erythema or ulceration NECK: supple, no JVD LYMPH:  no palpable lymphadenopathy in the cervical, axillary or inguinal regions LUNGS: clear to auscultation b/l with normal respiratory effort HEART: regular rate & rhythm ABDOMEN:  normoactive bowel sounds , non tender, not distended. No palpable hepatosplenomegaly.  Extremity: 2+ pedal edema bilaterally PSYCH: alert & oriented x 3 with fluent speech NEURO: no focal motor/sensory deficits   LABORATORY DATA:  I have reviewed the data as listed  . CBC Latest  Ref Rng & Units 01/31/2019 01/17/2019 01/10/2019  WBC 4.0 - 10.5 K/uL 4.5 5.2 3.2(L)  Hemoglobin 12.0 - 15.0 g/dL 10.8(L) 10.0(L) 9.7(L)  Hematocrit 36.0 - 46.0 % 34.0(L) 31.0(L) 30.9(L)  Platelets 150 - 400 K/uL 428(H) 283 547(H)    . CMP Latest Ref Rng & Units 01/31/2019 01/17/2019 01/10/2019  Glucose 70 - 99 mg/dL 100(H) 103(H) 110(H)  BUN 8 - 23 mg/dL _0 Creatinine 0.44 - 1.00 mg/dL 0.66 0.63 0.65  Sodium 135 - 145 mmol/L 140 138 140  Potassium 3.5 - 5.1 mmol/L 3.8 4.4 4.1  Chloride 98 - 111 mmol/L 103 101 106  CO2 22 - 32 mmol/L _1 Calcium 8.9 - 10.3 mg/dL 8.6(L) 8.4(L) 8.3(L)  Total Protein 6.5 - 8.1 g/dL 7.3 7.3 7.7  Total Bilirubin 0.3 - 1.2 mg/dL 0.3 0.4 0.3  Alkaline Phos 38 - 126 U/L 102  163(H) 196(H)  AST 15 - 41 U/L 10(L) 14(L) 16  ALT 0 - 44 U/L _2 12/01/18 BM Bx:   12/01/18 Flow Cytometry:       RADIOGRAPHIC STUDIES: I have personally reviewed the radiological images as listed and agreed with the findings in the report. No results found.  ASSESSMENT & PLAN:  78 y.o. female with  1. Multiple Myeloma with IgG Lambda specificity Labs upon initial presentation from 12/01/18 CBC w/diff revealed HGB at 10.4 with MCV of 105.7. 11/28/18 CMP revealed Creatinine normal at 0.68 and Calcium normal at 8.9. 11/28/18 Beta 2 microglobulin at 2.51m (also a reading at 4.77mon the same day). 11/30/18 24HR UPEP revealed M spike at 75.1%. 11/28/18 SPEP revealed M spike of 4.6g. 11/28/18 Immunoglobulins revealed IgG elevated at 61816m 12/01/18 BM Bx revealed hypercellular bone marrow with 70-80% CD138 immunohistochemistry (44% aspirate) lambda-restricted plasma cells as well as a kappa restricted population of B-cells 11/27/18 MRI Thoracic Spine which revealed Multifocal marrow signal abnormality consistent with metastatic disease or multiple myeloma. The patient has multiple compression fractures most consistent with pathologic injuries. Extensive marrow signal abnormality makes determining age of the fractures difficult but edema is most intense in T3. 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 2. Areas of hypermetabolic disease identified in both lungs suggesting multiple myeloma involvement. 3. 1.9 cm calcified left thyroid nodule is hypermetabolic, but Indeterminate. 4. Colon is diffusely distended with gas and stool.  Imaging features would be compatible with clinical constipation. 5.  Aortic Atherosclerois.  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 She does not currently have elevated lymphocyte counts on peripheral blood draws.  PLAN: -Discussed pt labwork today, 01/31/19; HGB improved to 10.8, Albumin normalized to 3.5, other blood counts and chemistries are stable -Discussed the 01/10/19 MMP which revealed M Protein improved to 2.0g from 4.3g, indicating more than a 50% reduction after the first cycle of treatment. -12/20/18 Vitamin D appropriate at 64.2 -The pt has no prohibitive toxicities from continuing C3D1 Velcade, Dexamethasone, and  Revlimid at this time. -Pt was able to tolerate Revlimid re-challenge with optimization of premeds and did not develop a rash -Continue Pepcid and Claritin. prn Hydroxyzine if rash presents again. -Recommended moisturizing legs  -Recommend that pt call the PT she was connected with through her at-home care service -Continue to focus on in home PT and nutrition -Will order [place] of thyroid in 1-2 months -Discussed that I do not recommend a biopsy of the left lung nodule at this point, but will look for response in this area as treatment continues. Will monitor and discussed that I cannot rule out a primary lung cancer at this point, but that this is likely related to her myeloma. -following with Dr Edilia Bo for mx of her glaucoma and monitoring in the setting of steroid use. -Continue leg elevation for leg swelling and graded sports compression socks -Continue low dose Lasix and 65mq Potassium replacement -Advised that pt weigh herself while on Lasix -Continue 853maspirin -Continue 40040mcyclovir BID -Continue 53m70mr Fentanyl patch to smoothen pain control -Continue Oxycodone 1-2 pills every  4-6 hours as needed -Continue two tablets of Senna S at night time and daily Miralax, backing off if diarrhea develops or OTC Fleet enema -Continue Xgeva every 4 weeks, pt denies current dental concerns -Pt has at-home nursing care through [facility] the pt eat very well and continue to stay well hydrate, and stay as active as she can reasonably manage to -Will see the pt back in 3 weeks   -continue treatment and followup for C3 and C4 as scheduled -continue XgevMarchelle Folks visit as scheduled with C4D1 on 5/19   All of the patients questions were answered with apparent satisfaction. The patient knows to call the clinic with any problems, questions or concerns.  The total time spent in the appt was 30 minutes and more than 50% was on counseling and direct patient cares.    GautSullivan LoneMS AAHIVMS SCH [facility]/Oncology Physician [facility]):       [phone] cell):  [phone]):           [phone]/2020 12:04 PM  I, SchuBaldwin [place] acting as a scribe for Dr. GautSullivan Lone.I have reviewed the above documentation for accuracy and completeness, and I agree with the above. .GauBrunetta Genera

## 2019-01-31 ENCOUNTER — Inpatient Hospital Stay: Payer: Medicare HMO

## 2019-01-31 ENCOUNTER — Inpatient Hospital Stay (HOSPITAL_BASED_OUTPATIENT_CLINIC_OR_DEPARTMENT_OTHER): Payer: Medicare HMO | Admitting: Hematology

## 2019-01-31 ENCOUNTER — Encounter: Payer: Self-pay | Admitting: Pharmacist

## 2019-01-31 ENCOUNTER — Telehealth: Payer: Self-pay | Admitting: Hematology

## 2019-01-31 ENCOUNTER — Other Ambulatory Visit: Payer: Self-pay | Admitting: Hematology

## 2019-01-31 ENCOUNTER — Other Ambulatory Visit: Payer: Self-pay

## 2019-01-31 VITALS — BP 151/82 | HR 78 | Temp 99.3°F | Resp 16 | Ht 62.5 in | Wt 108.1 lb

## 2019-01-31 DIAGNOSIS — R600 Localized edema: Secondary | ICD-10-CM

## 2019-01-31 DIAGNOSIS — Z79899 Other long term (current) drug therapy: Secondary | ICD-10-CM

## 2019-01-31 DIAGNOSIS — M549 Dorsalgia, unspecified: Secondary | ICD-10-CM | POA: Diagnosis not present

## 2019-01-31 DIAGNOSIS — D72820 Lymphocytosis (symptomatic): Secondary | ICD-10-CM

## 2019-01-31 DIAGNOSIS — C9000 Multiple myeloma not having achieved remission: Secondary | ICD-10-CM

## 2019-01-31 DIAGNOSIS — Z5111 Encounter for antineoplastic chemotherapy: Secondary | ICD-10-CM | POA: Diagnosis not present

## 2019-01-31 DIAGNOSIS — Z87891 Personal history of nicotine dependence: Secondary | ICD-10-CM

## 2019-01-31 DIAGNOSIS — Z7189 Other specified counseling: Secondary | ICD-10-CM

## 2019-01-31 DIAGNOSIS — K5900 Constipation, unspecified: Secondary | ICD-10-CM | POA: Diagnosis not present

## 2019-01-31 DIAGNOSIS — G893 Neoplasm related pain (acute) (chronic): Secondary | ICD-10-CM

## 2019-01-31 LAB — CBC WITH DIFFERENTIAL/PLATELET
Abs Immature Granulocytes: 0.01 10*3/uL (ref 0.00–0.07)
Basophils Absolute: 0.1 10*3/uL (ref 0.0–0.1)
Basophils Relative: 2 %
Eosinophils Absolute: 0.1 10*3/uL (ref 0.0–0.5)
Eosinophils Relative: 2 %
HCT: 34 % — ABNORMAL LOW (ref 36.0–46.0)
Hemoglobin: 10.8 g/dL — ABNORMAL LOW (ref 12.0–15.0)
Immature Granulocytes: 0 %
Lymphocytes Relative: 15 %
Lymphs Abs: 0.7 10*3/uL (ref 0.7–4.0)
MCH: 32.3 pg (ref 26.0–34.0)
MCHC: 31.8 g/dL (ref 30.0–36.0)
MCV: 101.8 fL — ABNORMAL HIGH (ref 80.0–100.0)
Monocytes Absolute: 0.6 10*3/uL (ref 0.1–1.0)
Monocytes Relative: 14 %
Neutro Abs: 3 10*3/uL (ref 1.7–7.7)
Neutrophils Relative %: 67 %
Platelets: 428 10*3/uL — ABNORMAL HIGH (ref 150–400)
RBC: 3.34 MIL/uL — ABNORMAL LOW (ref 3.87–5.11)
RDW: 15.2 % (ref 11.5–15.5)
WBC: 4.5 10*3/uL (ref 4.0–10.5)
nRBC: 0 % (ref 0.0–0.2)

## 2019-01-31 LAB — CMP (CANCER CENTER ONLY)
ALT: 10 U/L (ref 0–44)
AST: 10 U/L — ABNORMAL LOW (ref 15–41)
Albumin: 3.5 g/dL (ref 3.5–5.0)
Alkaline Phosphatase: 102 U/L (ref 38–126)
Anion gap: 10 (ref 5–15)
BUN: 11 mg/dL (ref 8–23)
CO2: 27 mmol/L (ref 22–32)
Calcium: 8.6 mg/dL — ABNORMAL LOW (ref 8.9–10.3)
Chloride: 103 mmol/L (ref 98–111)
Creatinine: 0.66 mg/dL (ref 0.44–1.00)
GFR, Est AFR Am: >60 mL/min (ref >60)
GFR, Estimated: >60 mL/min (ref >60)
Glucose, Bld: 100 mg/dL — ABNORMAL HIGH (ref 70–99)
Potassium: 3.8 mmol/L (ref 3.5–5.1)
Sodium: 140 mmol/L (ref 135–145)
Total Bilirubin: 0.3 mg/dL (ref 0.3–1.2)
Total Protein: 7.3 g/dL (ref 6.5–8.1)

## 2019-01-31 MED ORDER — POTASSIUM CHLORIDE CRYS ER 20 MEQ PO TBCR: 20.0000 meq | EXTENDED_RELEASE_TABLET | Freq: Every day | ORAL | 1 refills | Status: DC

## 2019-01-31 MED ORDER — DEXAMETHASONE 4 MG PO TABS: ORAL_TABLET | ORAL | 3 refills | Status: DC

## 2019-01-31 MED ORDER — FUROSEMIDE 20 MG PO TABS: 20.0000 mg | ORAL_TABLET | Freq: Every day | ORAL | 1 refills | Status: DC

## 2019-01-31 MED ORDER — PROCHLORPERAZINE MALEATE 10 MG PO TABS
10.0000 mg | ORAL_TABLET | Freq: Once | ORAL | Status: AC
  Administered 2019-01-31: 10 mg via ORAL

## 2019-01-31 MED ORDER — DEXAMETHASONE 4 MG PO TABS
ORAL_TABLET | ORAL | Status: AC
  Filled 2019-01-31: qty 5

## 2019-01-31 MED ORDER — PROCHLORPERAZINE MALEATE 10 MG PO TABS
ORAL_TABLET | ORAL | Status: AC
  Filled 2019-01-31: qty 1

## 2019-01-31 MED ORDER — DEXAMETHASONE 4 MG PO TABS
20.0000 mg | ORAL_TABLET | Freq: Once | ORAL | Status: AC
  Administered 2019-01-31: 13:00:00 20 mg via ORAL

## 2019-01-31 MED ORDER — BORTEZOMIB CHEMO SQ INJECTION 3.5 MG (2.5MG/ML)
1.3000 mg/m2 | Freq: Once | INTRAMUSCULAR | Status: AC
  Administered 2019-01-31: 14:00:00 2 mg via SUBCUTANEOUS
  Filled 2019-01-31: qty 0.8

## 2019-01-31 NOTE — Progress Notes (Signed)
01/31/19  Confirmed Dexamethasone oral dose to be given is 20 mg orally on days 1, 8 and 15 of each cycle.  V.O. Dr Cleda Clarks, PharmD

## 2019-01-31 NOTE — Patient Instructions (Signed)
[facility] Discharge Instructions for Patients Receiving Chemotherapy  Today you received the following chemotherapy agents:  Velcade (bortezomib)  To help prevent nausea and vomiting after your treatment, we encourage you to take your nausea medication as prescribed.   If you develop nausea and vomiting that is not controlled by your nausea medication, call the clinic.   BELOW ARE SYMPTOMS THAT SHOULD BE REPORTED IMMEDIATELY:  *FEVER GREATER THAN 100.5 F  *CHILLS WITH OR WITHOUT FEVER  NAUSEA AND VOMITING THAT IS NOT CONTROLLED WITH YOUR NAUSEA MEDICATION  *UNUSUAL SHORTNESS OF BREATH  *UNUSUAL BRUISING OR BLEEDING  TENDERNESS IN MOUTH AND THROAT WITH OR WITHOUT PRESENCE OF ULCERS  *URINARY PROBLEMS  *BOWEL PROBLEMS  UNUSUAL RASH Items with * indicate a potential emergency and should be followed up as soon as possible.  Feel free to call the clinic should you have any questions or concerns. The clinic phone number is [phone].  Please show the CHEMO ALERT CARD at check-in to the Emergency Department and triage nurse.   

## 2019-01-31 NOTE — Telephone Encounter (Signed)
Scheduled appt per 4/28 los. °

## 2019-01-31 NOTE — Progress Notes (Deleted)
01/31/19  Confirmed dexamethasone dose at 20 mg orally per Tresa Moore.  Henreitta Leber, PharmD

## 2019-02-01 ENCOUNTER — Telehealth: Payer: Self-pay | Admitting: *Deleted

## 2019-02-01 LAB — MULTIPLE MYELOMA PANEL, SERUM
Albumin SerPl Elph-Mcnc: 3.6 g/dL (ref 2.9–4.4)
Albumin/Glob SerPl: 1.1 (ref 0.7–1.7)
Alpha 1: 0.4 g/dL (ref 0.0–0.4)
Alpha2 Glob SerPl Elph-Mcnc: 0.9 g/dL (ref 0.4–1.0)
B-Globulin SerPl Elph-Mcnc: 2 g/dL — ABNORMAL HIGH (ref 0.7–1.3)
Gamma Glob SerPl Elph-Mcnc: 0.1 g/dL — ABNORMAL LOW (ref 0.4–1.8)
Globulin, Total: 3.4 g/dL (ref 2.2–3.9)
IgA: 25 mg/dL — ABNORMAL LOW (ref 64–422)
IgG (Immunoglobin G), Serum: 1697 mg/dL — ABNORMAL HIGH (ref 586–1602)
IgM (Immunoglobulin M), Srm: 22 mg/dL — ABNORMAL LOW (ref 26–217)
M Protein SerPl Elph-Mcnc: 1.2 g/dL — ABNORMAL HIGH
Total Protein ELP: 7 g/dL (ref 6.0–8.5)

## 2019-02-01 NOTE — Telephone Encounter (Signed)
Patient asked for name of OTC creme that Dr. Irene Limbo told her about for her heel. She states her heel is much better. Dr. Irene Limbo states he reccommended Aquaphor or vaseline as protectant. Patient informed and verbalized understanding.

## 2019-02-03 ENCOUNTER — Inpatient Hospital Stay: Payer: Medicare HMO | Attending: Hematology

## 2019-02-03 ENCOUNTER — Other Ambulatory Visit: Payer: Self-pay

## 2019-02-03 VITALS — BP 155/80 | HR 71 | Temp 98.4°F | Resp 16

## 2019-02-03 DIAGNOSIS — C9110 Chronic lymphocytic leukemia of B-cell type not having achieved remission: Secondary | ICD-10-CM | POA: Diagnosis not present

## 2019-02-03 DIAGNOSIS — Z5112 Encounter for antineoplastic immunotherapy: Secondary | ICD-10-CM | POA: Insufficient documentation

## 2019-02-03 DIAGNOSIS — Z7189 Other specified counseling: Secondary | ICD-10-CM

## 2019-02-03 DIAGNOSIS — Z7982 Long term (current) use of aspirin: Secondary | ICD-10-CM | POA: Diagnosis not present

## 2019-02-03 DIAGNOSIS — K5900 Constipation, unspecified: Secondary | ICD-10-CM | POA: Diagnosis not present

## 2019-02-03 DIAGNOSIS — C9000 Multiple myeloma not having achieved remission: Secondary | ICD-10-CM | POA: Insufficient documentation

## 2019-02-03 DIAGNOSIS — M8580 Other specified disorders of bone density and structure, unspecified site: Secondary | ICD-10-CM | POA: Insufficient documentation

## 2019-02-03 DIAGNOSIS — Z87891 Personal history of nicotine dependence: Secondary | ICD-10-CM | POA: Diagnosis not present

## 2019-02-03 DIAGNOSIS — I10 Essential (primary) hypertension: Secondary | ICD-10-CM | POA: Insufficient documentation

## 2019-02-03 DIAGNOSIS — Z79899 Other long term (current) drug therapy: Secondary | ICD-10-CM | POA: Diagnosis not present

## 2019-02-03 DIAGNOSIS — E041 Nontoxic single thyroid nodule: Secondary | ICD-10-CM | POA: Diagnosis not present

## 2019-02-03 MED ORDER — PROCHLORPERAZINE MALEATE 10 MG PO TABS
10.0000 mg | ORAL_TABLET | Freq: Once | ORAL | Status: AC
  Administered 2019-02-03: 10 mg via ORAL

## 2019-02-03 MED ORDER — BORTEZOMIB CHEMO SQ INJECTION 3.5 MG (2.5MG/ML)
1.3000 mg/m2 | Freq: Once | INTRAMUSCULAR | Status: AC
  Administered 2019-02-03: 2 mg via SUBCUTANEOUS
  Filled 2019-02-03: qty 0.8

## 2019-02-03 MED ORDER — PROCHLORPERAZINE MALEATE 10 MG PO TABS
ORAL_TABLET | ORAL | Status: AC
  Filled 2019-02-03: qty 1

## 2019-02-03 NOTE — Patient Instructions (Signed)
[facility] Discharge Instructions for Patients Receiving Chemotherapy  Today you received the following chemotherapy agents:  Velcade (bortezomib)  To help prevent nausea and vomiting after your treatment, we encourage you to take your nausea medication as prescribed.   If you develop nausea and vomiting that is not controlled by your nausea medication, call the clinic.   BELOW ARE SYMPTOMS THAT SHOULD BE REPORTED IMMEDIATELY:  *FEVER GREATER THAN 100.5 F  *CHILLS WITH OR WITHOUT FEVER  NAUSEA AND VOMITING THAT IS NOT CONTROLLED WITH YOUR NAUSEA MEDICATION  *UNUSUAL SHORTNESS OF BREATH  *UNUSUAL BRUISING OR BLEEDING  TENDERNESS IN MOUTH AND THROAT WITH OR WITHOUT PRESENCE OF ULCERS  *URINARY PROBLEMS  *BOWEL PROBLEMS  UNUSUAL RASH Items with * indicate a potential emergency and should be followed up as soon as possible.  Feel free to call the clinic should you have any questions or concerns. The clinic phone number is [phone].  Please show the CHEMO ALERT CARD at check-in to the Emergency Department and triage nurse.   

## 2019-02-06 ENCOUNTER — Other Ambulatory Visit: Payer: Self-pay | Admitting: Hematology

## 2019-02-06 ENCOUNTER — Telehealth: Payer: Self-pay | Admitting: *Deleted

## 2019-02-06 DIAGNOSIS — Z7189 Other specified counseling: Secondary | ICD-10-CM

## 2019-02-06 DIAGNOSIS — C9000 Multiple myeloma not having achieved remission: Secondary | ICD-10-CM

## 2019-02-06 NOTE — Telephone Encounter (Signed)
Reports fine rash ("looks like prickly heat") that started over weekend on feet, spread upwards around ankles and front of loser legs. No heat, itch or pain. Has not treated it. Also reports pain increased - taking 2 pain pills in early AM, 1 to 2 during day and 2 pain pills at bedtime ~ avg is 6/day. Will need refill before Saturday 5/9. Also reports increase in muscle spasms in back, states PT has taught stretches which help some. Wants to know if it's ok to take 1/2 Excedrin for headache. Advised her that all information will be given to Dr. Irene Limbo.  Per Dr. Irene Limbo: Take hydroxyzine as ordered TID for rash. Use OTC Hydrocortisone creme (can mix with lotion to make easier to spread) over rash on feet and legs. Pain med use is appropriate. Will refill before end of week. Ok to take 1/2 Excedrin for headache occassionally. Patient verbalized understanding of all information. She will call office to update on effectiveness of rash treatment.

## 2019-02-07 ENCOUNTER — Inpatient Hospital Stay: Payer: Medicare HMO

## 2019-02-07 ENCOUNTER — Other Ambulatory Visit: Payer: Self-pay

## 2019-02-07 VITALS — BP 137/86 | HR 71 | Temp 98.9°F | Resp 16

## 2019-02-07 DIAGNOSIS — E041 Nontoxic single thyroid nodule: Secondary | ICD-10-CM | POA: Diagnosis not present

## 2019-02-07 DIAGNOSIS — Z7189 Other specified counseling: Secondary | ICD-10-CM

## 2019-02-07 DIAGNOSIS — C9000 Multiple myeloma not having achieved remission: Secondary | ICD-10-CM

## 2019-02-07 DIAGNOSIS — R600 Localized edema: Secondary | ICD-10-CM

## 2019-02-07 DIAGNOSIS — G893 Neoplasm related pain (acute) (chronic): Secondary | ICD-10-CM

## 2019-02-07 DIAGNOSIS — Z79899 Other long term (current) drug therapy: Secondary | ICD-10-CM | POA: Diagnosis not present

## 2019-02-07 DIAGNOSIS — Z5112 Encounter for antineoplastic immunotherapy: Secondary | ICD-10-CM | POA: Diagnosis not present

## 2019-02-07 DIAGNOSIS — Z7982 Long term (current) use of aspirin: Secondary | ICD-10-CM | POA: Diagnosis not present

## 2019-02-07 DIAGNOSIS — I10 Essential (primary) hypertension: Secondary | ICD-10-CM | POA: Diagnosis not present

## 2019-02-07 DIAGNOSIS — M8580 Other specified disorders of bone density and structure, unspecified site: Secondary | ICD-10-CM | POA: Diagnosis not present

## 2019-02-07 DIAGNOSIS — K5900 Constipation, unspecified: Secondary | ICD-10-CM | POA: Diagnosis not present

## 2019-02-07 DIAGNOSIS — C9110 Chronic lymphocytic leukemia of B-cell type not having achieved remission: Secondary | ICD-10-CM | POA: Diagnosis not present

## 2019-02-07 DIAGNOSIS — Z87891 Personal history of nicotine dependence: Secondary | ICD-10-CM | POA: Diagnosis not present

## 2019-02-07 LAB — CMP (CANCER CENTER ONLY)
ALT: 12 U/L (ref 0–44)
AST: 13 U/L — ABNORMAL LOW (ref 15–41)
Albumin: 3.4 g/dL — ABNORMAL LOW (ref 3.5–5.0)
Alkaline Phosphatase: 73 U/L (ref 38–126)
Anion gap: 7 (ref 5–15)
BUN: 11 mg/dL (ref 8–23)
CO2: 28 mmol/L (ref 22–32)
Calcium: 8.6 mg/dL — ABNORMAL LOW (ref 8.9–10.3)
Chloride: 101 mmol/L (ref 98–111)
Creatinine: 0.65 mg/dL (ref 0.44–1.00)
GFR, Est AFR Am: >60 mL/min (ref >60)
GFR, Estimated: >60 mL/min (ref >60)
Glucose, Bld: 103 mg/dL — ABNORMAL HIGH (ref 70–99)
Potassium: 4.6 mmol/L (ref 3.5–5.1)
Sodium: 136 mmol/L (ref 135–145)
Total Bilirubin: 0.4 mg/dL (ref 0.3–1.2)
Total Protein: 6.7 g/dL (ref 6.5–8.1)

## 2019-02-07 LAB — CBC WITH DIFFERENTIAL/PLATELET
Abs Immature Granulocytes: 0.02 10*3/uL (ref 0.00–0.07)
Basophils Absolute: 0.1 10*3/uL (ref 0.0–0.1)
Basophils Relative: 1 %
Eosinophils Absolute: 0.3 10*3/uL (ref 0.0–0.5)
Eosinophils Relative: 5 %
HCT: 33.3 % — ABNORMAL LOW (ref 36.0–46.0)
Hemoglobin: 10.5 g/dL — ABNORMAL LOW (ref 12.0–15.0)
Immature Granulocytes: 0 %
Lymphocytes Relative: 10 %
Lymphs Abs: 0.6 10*3/uL — ABNORMAL LOW (ref 0.7–4.0)
MCH: 32.5 pg (ref 26.0–34.0)
MCHC: 31.5 g/dL (ref 30.0–36.0)
MCV: 103.1 fL — ABNORMAL HIGH (ref 80.0–100.0)
Monocytes Absolute: 0.4 10*3/uL (ref 0.1–1.0)
Monocytes Relative: 6 %
Neutro Abs: 4.6 10*3/uL (ref 1.7–7.7)
Neutrophils Relative %: 78 %
Platelets: 253 10*3/uL (ref 150–400)
RBC: 3.23 MIL/uL — ABNORMAL LOW (ref 3.87–5.11)
RDW: 14.8 % (ref 11.5–15.5)
WBC: 6 10*3/uL (ref 4.0–10.5)
nRBC: 0 % (ref 0.0–0.2)

## 2019-02-07 MED ORDER — PROCHLORPERAZINE MALEATE 10 MG PO TABS
10.0000 mg | ORAL_TABLET | Freq: Once | ORAL | Status: AC
  Administered 2019-02-07: 10 mg via ORAL

## 2019-02-07 MED ORDER — DEXAMETHASONE 4 MG PO TABS
20.0000 mg | ORAL_TABLET | Freq: Once | ORAL | Status: AC
  Administered 2019-02-07: 20 mg via ORAL

## 2019-02-07 MED ORDER — PROCHLORPERAZINE MALEATE 10 MG PO TABS
ORAL_TABLET | ORAL | Status: AC
  Filled 2019-02-07: qty 1

## 2019-02-07 MED ORDER — BORTEZOMIB CHEMO SQ INJECTION 3.5 MG (2.5MG/ML)
1.3000 mg/m2 | Freq: Once | INTRAMUSCULAR | Status: AC
  Administered 2019-02-07: 2 mg via SUBCUTANEOUS
  Filled 2019-02-07: qty 0.8

## 2019-02-07 MED ORDER — DEXAMETHASONE 4 MG PO TABS
ORAL_TABLET | ORAL | Status: AC
  Filled 2019-02-07: qty 5

## 2019-02-07 NOTE — Patient Instructions (Signed)
[facility] Discharge Instructions for Patients Receiving Chemotherapy  Today you received the following chemotherapy agents:  Velcade (bortezomib)  To help prevent nausea and vomiting after your treatment, we encourage you to take your nausea medication as prescribed.   If you develop nausea and vomiting that is not controlled by your nausea medication, call the clinic.   BELOW ARE SYMPTOMS THAT SHOULD BE REPORTED IMMEDIATELY:  *FEVER GREATER THAN 100.5 F  *CHILLS WITH OR WITHOUT FEVER  NAUSEA AND VOMITING THAT IS NOT CONTROLLED WITH YOUR NAUSEA MEDICATION  *UNUSUAL SHORTNESS OF BREATH  *UNUSUAL BRUISING OR BLEEDING  TENDERNESS IN MOUTH AND THROAT WITH OR WITHOUT PRESENCE OF ULCERS  *URINARY PROBLEMS  *BOWEL PROBLEMS  UNUSUAL RASH Items with * indicate a potential emergency and should be followed up as soon as possible.  Feel free to call the clinic should you have any questions or concerns. The clinic phone number is [phone].  Please show the CHEMO ALERT CARD at check-in to the Emergency Department and triage nurse.   

## 2019-02-07 NOTE — Progress Notes (Signed)
Pt reported a new rash bilateral legs extending to abdomen. "No itching yet". No other symptoms.  Pt stated she called over the weekend and reported this rash. It started Saturday 02/04/2019 and has gotten worse.  Dr Irene Limbo to come to tx area to assess.  Per Dr Irene Limbo ok to tx today with Velcade.

## 2019-02-09 ENCOUNTER — Other Ambulatory Visit: Payer: Self-pay | Admitting: Hematology

## 2019-02-09 ENCOUNTER — Other Ambulatory Visit: Payer: Self-pay | Admitting: *Deleted

## 2019-02-09 ENCOUNTER — Telehealth: Payer: Self-pay | Admitting: *Deleted

## 2019-02-09 DIAGNOSIS — H409 Unspecified glaucoma: Secondary | ICD-10-CM | POA: Diagnosis not present

## 2019-02-09 DIAGNOSIS — G893 Neoplasm related pain (acute) (chronic): Secondary | ICD-10-CM | POA: Diagnosis not present

## 2019-02-09 DIAGNOSIS — M8580 Other specified disorders of bone density and structure, unspecified site: Secondary | ICD-10-CM | POA: Diagnosis not present

## 2019-02-09 DIAGNOSIS — D539 Nutritional anemia, unspecified: Secondary | ICD-10-CM | POA: Diagnosis not present

## 2019-02-09 DIAGNOSIS — I10 Essential (primary) hypertension: Secondary | ICD-10-CM | POA: Diagnosis not present

## 2019-02-09 DIAGNOSIS — M8458XD Pathological fracture in neoplastic disease, other specified site, subsequent encounter for fracture with routine healing: Secondary | ICD-10-CM | POA: Diagnosis not present

## 2019-02-09 DIAGNOSIS — Z7952 Long term (current) use of systemic steroids: Secondary | ICD-10-CM | POA: Diagnosis not present

## 2019-02-09 DIAGNOSIS — D72820 Lymphocytosis (symptomatic): Secondary | ICD-10-CM | POA: Diagnosis not present

## 2019-02-09 DIAGNOSIS — R609 Edema, unspecified: Secondary | ICD-10-CM | POA: Diagnosis not present

## 2019-02-09 DIAGNOSIS — C9000 Multiple myeloma not having achieved remission: Secondary | ICD-10-CM | POA: Diagnosis not present

## 2019-02-09 MED ORDER — FENTANYL 25 MCG/HR TD PT72: 1.0000 | MEDICATED_PATCH | TRANSDERMAL | 0 refills | Status: DC

## 2019-02-09 MED ORDER — OXYCODONE-ACETAMINOPHEN 5-325 MG PO TABS: 1.0000 | ORAL_TABLET | ORAL | 0 refills | Status: DC | PRN

## 2019-02-09 NOTE — Telephone Encounter (Signed)
Rash not moving up any further, still covering most of her lower body. Not itching as much. Still using Hydroxyzine and Hydrocortisone. Last Revlimid was Tuesday 5/5. Reviewed appt times with her.

## 2019-02-09 NOTE — Telephone Encounter (Signed)
Called to request refill of pain medication before weekend. Takes approximately 6/day. Received #90 on 4/22, 15 day supply

## 2019-02-10 ENCOUNTER — Other Ambulatory Visit: Payer: Self-pay

## 2019-02-10 ENCOUNTER — Inpatient Hospital Stay: Payer: Medicare HMO

## 2019-02-10 ENCOUNTER — Other Ambulatory Visit: Payer: Self-pay | Admitting: Hematology

## 2019-02-10 VITALS — BP 155/73 | HR 65 | Temp 98.9°F | Resp 16

## 2019-02-10 DIAGNOSIS — Z79899 Other long term (current) drug therapy: Secondary | ICD-10-CM | POA: Diagnosis not present

## 2019-02-10 DIAGNOSIS — I10 Essential (primary) hypertension: Secondary | ICD-10-CM | POA: Diagnosis not present

## 2019-02-10 DIAGNOSIS — K5900 Constipation, unspecified: Secondary | ICD-10-CM | POA: Diagnosis not present

## 2019-02-10 DIAGNOSIS — C9000 Multiple myeloma not having achieved remission: Secondary | ICD-10-CM | POA: Diagnosis not present

## 2019-02-10 DIAGNOSIS — Z87891 Personal history of nicotine dependence: Secondary | ICD-10-CM | POA: Diagnosis not present

## 2019-02-10 DIAGNOSIS — C9110 Chronic lymphocytic leukemia of B-cell type not having achieved remission: Secondary | ICD-10-CM | POA: Diagnosis not present

## 2019-02-10 DIAGNOSIS — E041 Nontoxic single thyroid nodule: Secondary | ICD-10-CM | POA: Diagnosis not present

## 2019-02-10 DIAGNOSIS — Z7982 Long term (current) use of aspirin: Secondary | ICD-10-CM | POA: Diagnosis not present

## 2019-02-10 DIAGNOSIS — Z7189 Other specified counseling: Secondary | ICD-10-CM

## 2019-02-10 DIAGNOSIS — M8580 Other specified disorders of bone density and structure, unspecified site: Secondary | ICD-10-CM | POA: Diagnosis not present

## 2019-02-10 DIAGNOSIS — Z5112 Encounter for antineoplastic immunotherapy: Secondary | ICD-10-CM | POA: Diagnosis not present

## 2019-02-10 MED ORDER — PROCHLORPERAZINE MALEATE 10 MG PO TABS
10.0000 mg | ORAL_TABLET | Freq: Once | ORAL | Status: AC
  Administered 2019-02-10: 11:00:00 10 mg via ORAL

## 2019-02-10 MED ORDER — PROCHLORPERAZINE MALEATE 10 MG PO TABS
ORAL_TABLET | ORAL | Status: AC
  Filled 2019-02-10: qty 1

## 2019-02-10 MED ORDER — BORTEZOMIB CHEMO SQ INJECTION 3.5 MG (2.5MG/ML)
1.3000 mg/m2 | Freq: Once | INTRAMUSCULAR | Status: AC
  Administered 2019-02-10: 11:00:00 2 mg via SUBCUTANEOUS
  Filled 2019-02-10: qty 0.8

## 2019-02-13 ENCOUNTER — Other Ambulatory Visit: Payer: Self-pay | Admitting: Family Medicine

## 2019-02-13 ENCOUNTER — Telehealth: Payer: Self-pay | Admitting: Hematology

## 2019-02-13 DIAGNOSIS — H401122 Primary open-angle glaucoma, left eye, moderate stage: Secondary | ICD-10-CM | POA: Diagnosis not present

## 2019-02-13 DIAGNOSIS — H401112 Primary open-angle glaucoma, right eye, moderate stage: Secondary | ICD-10-CM | POA: Diagnosis not present

## 2019-02-13 DIAGNOSIS — Z1231 Encounter for screening mammogram for malignant neoplasm of breast: Secondary | ICD-10-CM

## 2019-02-13 NOTE — Telephone Encounter (Signed)
Per sch msg, cancelled 5/12 injection and added to 5/19 infusion. Patient has already been informed. Did not contact.

## 2019-02-14 ENCOUNTER — Inpatient Hospital Stay: Payer: Medicare HMO

## 2019-02-20 NOTE — Progress Notes (Signed)
HEMATOLOGY/ONCOLOGY CONSULTATION NOTE  Date of Service: 02/21/2019  Patient Care Team: [name], MD as PCP - General (Family Medicine)  Dr. Ronney Lion as [place] Specialist  [phone]  CHIEF COMPLAINTS/PURPOSE OF CONSULTATION:  Multiple Myeloma- continued management  HISTORY OF PRESENTING ILLNESS:   Megan Olson is a wonderful 78 y.o. female who has been referred to [place] by Dr. 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 She is accompanied today by her husband. The pt reports that she is doing well overall.   The pt notes that she was doing extensive yard work in October 2019, and began to feel back pain a few days after this, which she attributed to muscle pain. She recalls taking a deep breath, and developing sudden acute pain, and presented to care with her PCP's office. She began exercises for her back pain, without relief, then began PT without relief, then was referred to Dr. Paulla Fore in sports medicine in late January 2020. She had an XR which revealed a compression fracture at T11, then subsequently had an MRI, and a bone marrow biopsy. The pt notes that her back pain "seemed to move around." She endorses pain "up the whole left side" of her back.  The pt notes that she is not able to stand up straight due to her back pain, which she feels limits her ability to take a deep breath, and endorses pain exacerbation when she does take a deep breath. The pt needs assistance from sitting to lying from her husband. She is using about 3 Percocet a day.  The pt notes worsened constipation since beginning Percocet, and notes that her most recent laxative order was not covered by her insurance. She is using prune juice and milk of magnesia. She took Senokot S BID without relief.  The pt reports that prior to her recent back pain, she had very few medical concerns. She endorses controlled BP, and has been monitored for DM  but after closely watching her diet her A1C decreased to 5.8. She has glaucoma, and has had surgery in both eyes. Right eye with stent. The pt sees Dr. Edilia Bo at [facility] for her eye care. The pt notes that her vision has been recently "pretty good." The pt notes that she has been advised to limit treatment with steroids for her glaucoma. She denies heart or lung problems. Denies previous back problems. Last DEXA scan 3 years ago, and endorses osteopenia. She notes that she took Fosamax for 3-4 years, and stopped 5-6 years ago. She takes Vitamin D, a multivitamin, and magnesium.  The pt notes that she quit smoking cigarettes when she was 27, started when she was about 20. The pt consumes alcohol rarely, but not since beginning narcotics. 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  Of note prior to the patient's visit today, pt has had an MRI Thoracic Spine completed on 11/27/18 with results revealing Multifocal marrow signal abnormality consistent with metastatic disease or multiple myeloma. The patient has multiple compression fractures most consistent with pathologic injuries. Extensive marrow signal abnormality makes determining age of the fractures difficult but edema is most intense in T3. 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  Most recent lab results (12/01/18) of CBC w/diff is as follows: all values are WNL except for RBC at 3.17, HGB at 10.4, HCT at 33.5, MCV at 105.7.  11/28/18 CMP revealed all values WNL except for Glucose at 105, Total Protein at 10.2, Albumin at 3.1 11/30/18 24HR UPEP revealed all values WNL except for M-spike at 75.1%. 11/28/18 Bega-2 microglobulin slightly elevated at 2.6 11/28/18 SPEP revealed M spike at 4.6g 11/28/18 Immunoglobulins revealed IgG at 6181, IgA at 24, IgM at 16, and IgE at 6.  On review of systems, pt reports constipation, back pain, stable energy levels, ankle swelling,  tenderness at T3, lower back pain, and denies abdominal pains, neck pain, and any other symptoms.   On PMHx the pt reports redundant colon, single hemorrhoid, glaucoma, HTN, osteopenia, Tonsillectomy, right eye stent and multiple eye surgeries. On Social Hx the pt reports rare alcohol use, smoked cigarettes between ages 68 and 47. 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 On Family Hx the pt reports sister died from small cell lung cancer and was a lifelong smoker, brother's daughter died of breast cancer with BRCA1 and BRCA2 mutations. Cousin with female breast cancer.  Interval History:   Megan Olson returns today for management and evaluation of her recently diagnosed multiple myeloma. The patient's last visit with [place] was on 01/31/19. The pt reports that she is doing well overall.  The pt reports that her previous rash has completely resolved and has held Revlimid since her rash appeared 8 days after she resumed Revlimid. She notes that this most recent rash spread from her feet, to legs, to abdomen. She notes that her leg swelling has continued to improve and she notes that she has been using compression socks a little bit each day and also continues on Lasix. She endorses a good appetite, good bowel movement, well controlled pain, and is eating very well. She has continued to stay as active as she can with assistance from her husband walking through their garden and around her home. She denies fevers and checks her temperature daily, denies sore throat or diarrhea.  Lab results today (02/21/19) of CBC w/diff and CMP is as follows: all values are WNL except for RBC at 3.38, HGB at 10.7, HCT at 34.2, MCV at 101.2, PLT at 401k, Glucose at 117, AST at 13. 02/21/19 MMP shows M spike of 1g/dl.  On review of systems, pt reports resolved rash, eating well, good appetite, staying active, moving her bowels well, improved leg swelling, and denies concerns for infections, mouth sores, sore throat, fevers, diarrhea,  abdominal pains, and any other symptoms.   MEDICAL HISTORY:  Past Medical History:  Diagnosis Date  . Cancer ([place])    multiple myeloma  . Glaucoma   . HYPERTENSION 03/11/2007  . OSTEOPENIA 03/11/2007    SURGICAL HISTORY: Past Surgical History:  Procedure Laterality Date  . BREAST EXCISIONAL BIOPSY Right 2000  . BREAST LUMPECTOMY  1990   benign  . DILATION AND CURETTAGE OF UTERUS     bleeding at menopause. No uterine cancer  . IR RADIOLOGIST EVAL & MGMT  12/13/2018  . TONSILLECTOMY     age 60    SOCIAL HISTORY: Social History   Socioeconomic History  . Marital status: Married    Spouse name: Not on file  . Number of children: Not on file  . Years of education: Not on file  . Highest education level: Not on file  Occupational History  . Not on file  Social Needs  . Financial resource strain: Not on file  . Food insecurity:    Worry: Not on file    Inability: Not on file  . Transportation needs:  Medical: Not on file    Non-medical: Not on file  Tobacco Use  . Smoking status: Former Smoker    Packs/day: 0.50    Years: 7.00    Pack years: 3.50    Types: Cigarettes    Last attempt to quit: 01/04/1961    Years since quitting: 58.1  . Smokeless tobacco: Never Used  Substance and Sexual Activity  . Alcohol use: Yes    Alcohol/week: 1.0 standard drinks    Types: 1 Standard drinks or equivalent per week  . Drug use: No  . Sexual activity: Not Currently  Lifestyle  . Physical activity:    Days per week: Not on file    Minutes per session: Not on file  . Stress: Not on file  Relationships  . Social connections:    Talks on phone: Not on file    Gets together: Not on file    Attends religious service: Not on file    Active member of club or organization: Not on file    Attends meetings of clubs or organizations: Not on file    Relationship status: Not on file  . Intimate partner violence:    Fear of current or ex partner: Not on file    Emotionally abused: Not  on file    Physically abused: Not on file    Forced sexual activity: Not on file  Other Topics Concern  . Not on file  Social History Narrative   Married. Lives with husband (patient of Dr. Yong Channel). 1 son. No grandkids. 1 granddog.       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 Heart disease Mother        CHF mother died 47  . Arthritis Mother   . Glaucoma Mother        sister as well  . Alcohol abuse Father   . Suicidality Father   . Cancer Sister        lung cancer, smoker  . Heart disease Sister        aortic valve replacement  . Hyperlipidemia Brother   . Hypertension Brother   . COPD Brother   . Arthritis Sister   . Hypertension Sister   . Glaucoma Sister   . Hashimoto's thyroiditis Sister   . Hypertension Son   . Stroke Maternal Grandmother   . Colon cancer Neg Hx   . Colon polyps Neg Hx     ALLERGIES:  is allergic to ace inhibitors; benadryl [diphenhydramine]; diamox [acetazolamide]; sulfamethoxazole; and penicillins.  MEDICATIONS:  Current Outpatient Medications  Medication Sig Dispense Refill  . acyclovir (ZOVIRAX) 400 MG tablet Take 1 tablet (400 mg total) by mouth 2 (two) times daily. 60 tablet 3  . ALPHAGAN P 0.1 % SOLN Apply 1 drop topically See admin instructions. Apply 2 drops in right eye 3 times a day    . amLODipine (NORVASC) 5 MG tablet Take 1 tablet (5 mg total) by mouth daily. 90 tablet 3  . aspirin EC 81 MG tablet Take 81 mg by mouth daily.    . bimatoprost (LUMIGAN) 0.03 % ophthalmic solution Place 1 drop into the right eye at bedtime.     . Cholecalciferol (VITAMIN D3) 1000 UNITS CAPS Take 1,000 Units by mouth 2 (two) times daily.     Marland Kitchen  co-enzyme Q-10 50 MG capsule Take 50 mg by mouth daily.      Marland Kitchen dexamethasone (DECADRON) 4 MG tablet 54m (5 tabs) on Day 15 of  each cycle of treatment 15 tablet 3  . dorzolamide-timolol (COSOPT) 22.3-6.8 MG/ML ophthalmic solution Place 2 drops into both eyes 2 (two) times daily.   11  . fentaNYL (DURAGESIC) 25 MCG/HR Place 1 patch onto the skin every 3 (three) days. 10 patch 0  . furosemide (LASIX) 20 MG tablet Take 1 tablet (20 mg total) by mouth daily. 30 tablet 1  . hydrOXYzine (ATARAX/VISTARIL) 25 MG tablet Take 1 tablet (25 mg total) by mouth 3 (three) times daily as needed. 30 tablet 0  . magnesium hydroxide (MILK OF MAGNESIA) 400 MG/5ML suspension Take 15 mLs by mouth daily as needed for mild constipation or moderate constipation. 355 mL 0  . Magnesium Oxide 500 MG TABS Take 1 tablet by mouth daily.      . Multiple Vitamins-Minerals (MULTIVITAMIN WITH MINERALS) tablet Take 1 tablet by mouth daily.      . ondansetron (ZOFRAN) 8 MG tablet Take 1 tablet (8 mg total) by mouth 2 (two) times daily as needed (Nausea or vomiting). 30 tablet 1  . oxyCODONE-acetaminophen (PERCOCET) 5-325 MG tablet Take 1-2 tablets by mouth every 4 (four) hours as needed for moderate pain or severe pain. 90 tablet 0  . polyethylene glycol (MIRALAX) packet Take 17 g by mouth daily. 30 each 1  . potassium chloride SA (K-DUR) 20 MEQ tablet Take 1 tablet (20 mEq total) by mouth daily. 30 tablet 1  . prochlorperazine (COMPAZINE) 10 MG tablet Take 1 tablet (10 mg total) by mouth every 6 (six) hours as needed (Nausea or vomiting). 30 tablet 1  . senna-docusate (SENOKOT-S) 8.6-50 MG tablet Take 2 tablets by mouth at bedtime. 60 tablet 2   No current facility-administered medications for this visit.     REVIEW OF SYSTEMS:    A 10+ POINT REVIEW OF SYSTEMS WAS OBTAINED including neurology, dermatology, psychiatry, cardiac, respiratory, lymph, extremities, GI, GU, Musculoskeletal, constitutional, breasts, reproductive, HEENT.  All pertinent positives are noted in the HPI.  All others are negative.   PHYSICAL EXAMINATION: ECOG PERFORMANCE STATUS: 1-2   Vitals:   02/21/19 1308  BP: (!) 165/81  Pulse: 74  Resp: 18  Temp: 100.3 F (37.9 C)  SpO2: 97%   Filed Weights   02/21/19 1308  Weight: 110 lb 4.8 oz (50 kg)   .Body mass index is 19.85 kg/m.  GENERAL:alert, in no acute distress and comfortable SKIN: no acute rashes, no significant lesions EYES: conjunctiva are pink and non-injected, sclera anicteric OROPHARYNX: MMM, no exudates, no oropharyngeal erythema or ulceration NECK: supple, no JVD LYMPH:  no palpable lymphadenopathy in the cervical, axillary or inguinal regions LUNGS: clear to auscultation b/l with normal respiratory effort HEART: regular rate & rhythm ABDOMEN:  normoactive bowel sounds , non tender, not distended. No palpable hepatosplenomegaly.  Extremity: 1 to 2+ pedal edema PSYCH: alert & oriented x 3 with fluent speech NEURO: no focal motor/sensory deficits   LABORATORY DATA:  I have reviewed the data as listed  . CBC Latest Ref Rng & Units 02/21/2019 02/07/2019 01/31/2019  WBC 4.0 - 10.5 K/uL 4.9 6.0 4.5  Hemoglobin 12.0 - 15.0 g/dL 10.7(L) 10.5(L) 10.8(L)  Hematocrit 36.0 - 46.0 % 34.2(L) 33.3(L) 34.0(L)  Platelets 150 - 400 K/uL 401(H) 253 428(H)    . CMP Latest Ref Rng & Units 02/21/2019 02/07/2019 01/31/2019  Glucose  70 - 99 mg/dL 117(H) 103(H) 100(H)  BUN 8 - 23 mg/dL _0 Creatinine 0.44 - 1.00 mg/dL 0.71 0.65 0.66  Sodium 135 - 145 mmol/L 140 136 140  Potassium 3.5 - 5.1 mmol/L 4.2 4.6 3.8  Chloride 98 - 111 mmol/L 102 101 103  CO2 22 - 32 mmol/L _1 Calcium 8.9 - 10.3 mg/dL 9.0 8.6(L) 8.6(L)  Total Protein 6.5 - 8.1 g/dL 6.9 6.7 7.3  Total Bilirubin 0.3 - 1.2 mg/dL 0.3 0.4 0.3  Alkaline Phos 38 - 126 U/L 80 73 102  AST 15 - 41 U/L 13(L) 13(L) 10(L)  ALT 0 - 44 U/L _2 12/01/18 BM Bx:   12/01/18 Flow Cytometry:       RADIOGRAPHIC STUDIES: I have personally reviewed the radiological images as listed and agreed with the findings in the report. No results  found.  ASSESSMENT & PLAN:   78 y.o. female with  1. Multiple Myeloma with IgG Lambda specificity Labs upon initial presentation from 12/01/18 CBC w/diff revealed HGB at 10.4 with MCV of 105.7. 11/28/18 CMP revealed Creatinine normal at 0.68 and Calcium normal at 8.9. 11/28/18 Beta 2 microglobulin at 2.80m (also a reading at 4.716mon the same day). 11/30/18 24HR UPEP revealed M spike at 75.1%. 11/28/18 SPEP revealed M spike of 4.6g. 11/28/18 Immunoglobulins revealed IgG elevated at 618113m 12/01/18 BM Bx revealed hypercellular bone marrow with 70-80% CD138 immunohistochemistry (44% aspirate) lambda-restricted plasma cells as well as a kappa restricted population of B-cells 11/27/18 MRI Thoracic Spine which revealed Multifocal marrow signal abnormality consistent with metastatic disease or multiple myeloma. The patient has multiple compression fractures most consistent with pathologic injuries. Extensive marrow signal abnormality makes determining age of the fractures difficult but edema is most intense in T3. 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 2. Areas of hypermetabolic disease identified in both lungs suggesting multiple myeloma involvement. 3. 1.9 cm calcified left thyroid nodule is hypermetabolic, but Indeterminate. 4. Colon is diffusely distended with gas and stool. Imaging features would be compatible with clinical constipation. 5.  Aortic Atherosclerois.  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 She does not currently have elevated lymphocyte counts on peripheral blood draws.  PLAN: -Discussed pt labwork today, 02/21/19; albumin normalized to 3.8, other blood counts and chemistries are stable -02/21/19 MMP shows M protein of 1. Last M Protein improved to 1.2g on 01/31/19 from 4.3g pretreatment -The pt has no prohibitive toxicities from continuing C4D1 Velcade, and Dexamethasone at this time. -Pt describes grade II to III rash on her re-challenge from Revlimid with optimized pre-medications, and we will now discontinue Revlimid -Will now switch to Cyclophosphamide weekly, with premedications, which the pt agrees with and prefers -12/20/18 Vitamin D appropriate at 64.2 -Continue to focus on in home PT and nutrition -Will order US US thyroid in 1-2 months -Discussed that I do not recommend a biopsy of the left lung nodule at this point, but  will look for response in this area as treatment continues. Will monitor and discussed that I cannot rule out a primary lung cancer at this point, but that this is likely related to her myeloma. -Following with Dr Edilia Bo for mx of her glaucoma and monitoring in the setting of steroid use. -Recommended moisturizing legs -Continue leg elevation for leg swelling and graded sports compression socks -Continue low dose Lasix and 16mq Potassium replacement -Advised that pt weigh herself while on Lasix -Continue 871maspirin -Continue 40078mcyclovir BID -Continue 66m41mr Fentanyl patch to smoothen pain control -Continue Oxycodone 1-2 pills every 4-6 hours as needed -Continue two tablets of Senna S at night time and daily Miralax, backing off if diarrhea develops or OTC Fleet enema -Continue Xgeva every 4 weeks, pt denies current dental concerns -Pt has at-home nursing care through [facility] the pt eat very well and continue to stay well hydrate, and stay as active as she can reasonably manage to -Will see the  pt back with C5   -continue treatment and followup for C4 and plz schedule C5 of treatment -continue XgevMarchelle Folks visit as scheduled with C5D1   All of the patients questions were answered with apparent satisfaction. The patient knows to call the clinic with any problems, questions or concerns.  The total time spent in the appt was 25 minutes and more than 50% was on counseling and direct patient cares.    GautSullivan LoneMS AAHIVMS SCH [facility] LPatology/Oncology Physician [facility]):       [phone] cell):  [phone]):           [phone]/2020 1:57 PM  I, SchuBaldwin [place] acting as a scribe for Dr. GautSullivan Lone.I have reviewed the above documentation for accuracy and completeness, and I agree with the above. .GauBrunetta Genera

## 2019-02-21 ENCOUNTER — Other Ambulatory Visit: Payer: Self-pay

## 2019-02-21 ENCOUNTER — Inpatient Hospital Stay (HOSPITAL_BASED_OUTPATIENT_CLINIC_OR_DEPARTMENT_OTHER): Payer: Medicare HMO | Admitting: Hematology

## 2019-02-21 ENCOUNTER — Telehealth: Payer: Self-pay | Admitting: Hematology

## 2019-02-21 ENCOUNTER — Inpatient Hospital Stay: Payer: Medicare HMO

## 2019-02-21 VITALS — BP 165/81 | HR 74 | Temp 100.3°F | Resp 18 | Ht 62.5 in | Wt 110.3 lb

## 2019-02-21 DIAGNOSIS — M8580 Other specified disorders of bone density and structure, unspecified site: Secondary | ICD-10-CM | POA: Diagnosis not present

## 2019-02-21 DIAGNOSIS — C9000 Multiple myeloma not having achieved remission: Secondary | ICD-10-CM

## 2019-02-21 DIAGNOSIS — Z87891 Personal history of nicotine dependence: Secondary | ICD-10-CM

## 2019-02-21 DIAGNOSIS — Z5111 Encounter for antineoplastic chemotherapy: Secondary | ICD-10-CM

## 2019-02-21 DIAGNOSIS — Z7982 Long term (current) use of aspirin: Secondary | ICD-10-CM

## 2019-02-21 DIAGNOSIS — E041 Nontoxic single thyroid nodule: Secondary | ICD-10-CM | POA: Diagnosis not present

## 2019-02-21 DIAGNOSIS — Z5112 Encounter for antineoplastic immunotherapy: Secondary | ICD-10-CM | POA: Diagnosis not present

## 2019-02-21 DIAGNOSIS — K5900 Constipation, unspecified: Secondary | ICD-10-CM

## 2019-02-21 DIAGNOSIS — Z79899 Other long term (current) drug therapy: Secondary | ICD-10-CM

## 2019-02-21 DIAGNOSIS — I10 Essential (primary) hypertension: Secondary | ICD-10-CM

## 2019-02-21 DIAGNOSIS — C9110 Chronic lymphocytic leukemia of B-cell type not having achieved remission: Secondary | ICD-10-CM | POA: Diagnosis not present

## 2019-02-21 DIAGNOSIS — G893 Neoplasm related pain (acute) (chronic): Secondary | ICD-10-CM

## 2019-02-21 DIAGNOSIS — Z7189 Other specified counseling: Secondary | ICD-10-CM

## 2019-02-21 LAB — CMP (CANCER CENTER ONLY)
ALT: 14 U/L (ref 0–44)
AST: 13 U/L — ABNORMAL LOW (ref 15–41)
Albumin: 3.8 g/dL (ref 3.5–5.0)
Alkaline Phosphatase: 80 U/L (ref 38–126)
Anion gap: 9 (ref 5–15)
BUN: 12 mg/dL (ref 8–23)
CO2: 29 mmol/L (ref 22–32)
Calcium: 9 mg/dL (ref 8.9–10.3)
Chloride: 102 mmol/L (ref 98–111)
Creatinine: 0.71 mg/dL (ref 0.44–1.00)
GFR, Est AFR Am: >60 mL/min (ref >60)
GFR, Estimated: >60 mL/min (ref >60)
Glucose, Bld: 117 mg/dL — ABNORMAL HIGH (ref 70–99)
Potassium: 4.2 mmol/L (ref 3.5–5.1)
Sodium: 140 mmol/L (ref 135–145)
Total Bilirubin: 0.3 mg/dL (ref 0.3–1.2)
Total Protein: 6.9 g/dL (ref 6.5–8.1)

## 2019-02-21 LAB — CBC WITH DIFFERENTIAL/PLATELET
Abs Immature Granulocytes: 0.01 10*3/uL (ref 0.00–0.07)
Basophils Absolute: 0.2 10*3/uL — ABNORMAL HIGH (ref 0.0–0.1)
Basophils Relative: 3 %
Eosinophils Absolute: 0.2 10*3/uL (ref 0.0–0.5)
Eosinophils Relative: 5 %
HCT: 34.2 % — ABNORMAL LOW (ref 36.0–46.0)
Hemoglobin: 10.7 g/dL — ABNORMAL LOW (ref 12.0–15.0)
Immature Granulocytes: 0 %
Lymphocytes Relative: 17 %
Lymphs Abs: 0.8 10*3/uL (ref 0.7–4.0)
MCH: 31.7 pg (ref 26.0–34.0)
MCHC: 31.3 g/dL (ref 30.0–36.0)
MCV: 101.2 fL — ABNORMAL HIGH (ref 80.0–100.0)
Monocytes Absolute: 0.7 10*3/uL (ref 0.1–1.0)
Monocytes Relative: 15 %
Neutro Abs: 3 10*3/uL (ref 1.7–7.7)
Neutrophils Relative %: 60 %
Platelets: 401 10*3/uL — ABNORMAL HIGH (ref 150–400)
RBC: 3.38 MIL/uL — ABNORMAL LOW (ref 3.87–5.11)
RDW: 15.2 % (ref 11.5–15.5)
WBC: 4.9 10*3/uL (ref 4.0–10.5)
nRBC: 0 % (ref 0.0–0.2)

## 2019-02-21 MED ORDER — DEXAMETHASONE 4 MG PO TABS
ORAL_TABLET | ORAL | Status: AC
  Filled 2019-02-21: qty 5

## 2019-02-21 MED ORDER — DENOSUMAB 120 MG/1.7ML ~~LOC~~ SOLN
SUBCUTANEOUS | Status: AC
  Filled 2019-02-21: qty 1.7

## 2019-02-21 MED ORDER — PROCHLORPERAZINE MALEATE 10 MG PO TABS
ORAL_TABLET | ORAL | Status: AC
  Filled 2019-02-21: qty 1

## 2019-02-21 MED ORDER — DEXAMETHASONE 4 MG PO TABS
20.0000 mg | ORAL_TABLET | Freq: Once | ORAL | Status: AC
  Administered 2019-02-21: 20 mg via ORAL

## 2019-02-21 MED ORDER — OXYCODONE-ACETAMINOPHEN 5-325 MG PO TABS: 1.0000 | ORAL_TABLET | ORAL | 0 refills | Status: DC | PRN

## 2019-02-21 MED ORDER — BORTEZOMIB CHEMO SQ INJECTION 3.5 MG (2.5MG/ML)
1.3000 mg/m2 | Freq: Once | INTRAMUSCULAR | Status: AC
  Administered 2019-02-21: 2 mg via SUBCUTANEOUS
  Filled 2019-02-21: qty 0.8

## 2019-02-21 MED ORDER — DENOSUMAB 120 MG/1.7ML ~~LOC~~ SOLN
120.0000 mg | Freq: Once | SUBCUTANEOUS | Status: AC
  Administered 2019-02-21: 120 mg via SUBCUTANEOUS

## 2019-02-21 MED ORDER — PROCHLORPERAZINE MALEATE 10 MG PO TABS
10.0000 mg | ORAL_TABLET | Freq: Once | ORAL | Status: AC
  Administered 2019-02-21: 14:00:00 10 mg via ORAL

## 2019-02-21 NOTE — Telephone Encounter (Signed)
Per 5/19 los, appointments already scheduled.

## 2019-02-21 NOTE — Patient Instructions (Signed)
[facility] Discharge Instructions for Patients Receiving Chemotherapy  Today you received the following chemotherapy agents:  Velcade (bortezomib)  To help prevent nausea and vomiting after your treatment, we encourage you to take your nausea medication as prescribed.   If you develop nausea and vomiting that is not controlled by your nausea medication, call the clinic.   BELOW ARE SYMPTOMS THAT SHOULD BE REPORTED IMMEDIATELY:  *FEVER GREATER THAN 100.5 F  *CHILLS WITH OR WITHOUT FEVER  NAUSEA AND VOMITING THAT IS NOT CONTROLLED WITH YOUR NAUSEA MEDICATION  *UNUSUAL SHORTNESS OF BREATH  *UNUSUAL BRUISING OR BLEEDING  TENDERNESS IN MOUTH AND THROAT WITH OR WITHOUT PRESENCE OF ULCERS  *URINARY PROBLEMS  *BOWEL PROBLEMS  UNUSUAL RASH Items with * indicate a potential emergency and should be followed up as soon as possible.  Feel free to call the clinic should you have any questions or concerns. The clinic phone number is [phone].  Please show the CHEMO ALERT CARD at check-in to the Emergency Department and triage nurse.   

## 2019-02-23 ENCOUNTER — Telehealth: Payer: Self-pay | Admitting: *Deleted

## 2019-02-23 LAB — MULTIPLE MYELOMA PANEL, SERUM
Albumin SerPl Elph-Mcnc: 3.7 g/dL (ref 2.9–4.4)
Albumin/Glob SerPl: 1.3 (ref 0.7–1.7)
Alpha 1: 0.3 g/dL (ref 0.0–0.4)
Alpha2 Glob SerPl Elph-Mcnc: 0.8 g/dL (ref 0.4–1.0)
B-Globulin SerPl Elph-Mcnc: 1.6 g/dL — ABNORMAL HIGH (ref 0.7–1.3)
Gamma Glob SerPl Elph-Mcnc: 0.2 g/dL — ABNORMAL LOW (ref 0.4–1.8)
Globulin, Total: 2.9 g/dL (ref 2.2–3.9)
IgA: 31 mg/dL — ABNORMAL LOW (ref 64–422)
IgG (Immunoglobin G), Serum: 1180 mg/dL (ref 586–1602)
IgM (Immunoglobulin M), Srm: 26 mg/dL (ref 26–217)
M Protein SerPl Elph-Mcnc: 1 g/dL — ABNORMAL HIGH
Total Protein ELP: 6.6 g/dL (ref 6.0–8.5)

## 2019-02-23 MED ORDER — CYCLOPHOSPHAMIDE 50 MG PO CAPS: 300.0000 mg/m2 | ORAL_CAPSULE | ORAL | 2 refills | Status: DC

## 2019-02-23 NOTE — Telephone Encounter (Signed)
Patient called to verify name of medication Dr. Irene Limbo discussed in appt this week (Cytoxan/generic Cyclophosphamide) and wanted to confirm that it would be an oral medication. She also wanted to to have Vitamin C 500mg  added to her medication list. Advised patient that Dr. Irene Limbo had notified [facility] oral pharmacist regarding new medication. Patient verbalized understanding.

## 2019-02-24 ENCOUNTER — Other Ambulatory Visit: Payer: Self-pay

## 2019-02-24 ENCOUNTER — Inpatient Hospital Stay: Payer: Medicare HMO

## 2019-02-24 VITALS — BP 162/90 | HR 72 | Temp 97.8°F | Resp 18

## 2019-02-24 DIAGNOSIS — C9000 Multiple myeloma not having achieved remission: Secondary | ICD-10-CM

## 2019-02-24 DIAGNOSIS — C9110 Chronic lymphocytic leukemia of B-cell type not having achieved remission: Secondary | ICD-10-CM | POA: Diagnosis not present

## 2019-02-24 DIAGNOSIS — Z87891 Personal history of nicotine dependence: Secondary | ICD-10-CM | POA: Diagnosis not present

## 2019-02-24 DIAGNOSIS — E041 Nontoxic single thyroid nodule: Secondary | ICD-10-CM | POA: Diagnosis not present

## 2019-02-24 DIAGNOSIS — K5900 Constipation, unspecified: Secondary | ICD-10-CM | POA: Diagnosis not present

## 2019-02-24 DIAGNOSIS — M8580 Other specified disorders of bone density and structure, unspecified site: Secondary | ICD-10-CM | POA: Diagnosis not present

## 2019-02-24 DIAGNOSIS — Z5112 Encounter for antineoplastic immunotherapy: Secondary | ICD-10-CM | POA: Diagnosis not present

## 2019-02-24 DIAGNOSIS — Z79899 Other long term (current) drug therapy: Secondary | ICD-10-CM | POA: Diagnosis not present

## 2019-02-24 DIAGNOSIS — I10 Essential (primary) hypertension: Secondary | ICD-10-CM | POA: Diagnosis not present

## 2019-02-24 DIAGNOSIS — Z7189 Other specified counseling: Secondary | ICD-10-CM

## 2019-02-24 DIAGNOSIS — Z7982 Long term (current) use of aspirin: Secondary | ICD-10-CM | POA: Diagnosis not present

## 2019-02-24 MED ORDER — BORTEZOMIB CHEMO SQ INJECTION 3.5 MG (2.5MG/ML)
1.3000 mg/m2 | Freq: Once | INTRAMUSCULAR | Status: AC
  Administered 2019-02-24: 12:00:00 2 mg via SUBCUTANEOUS
  Filled 2019-02-24: qty 0.8

## 2019-02-24 MED ORDER — PROCHLORPERAZINE MALEATE 10 MG PO TABS
ORAL_TABLET | ORAL | Status: AC
  Filled 2019-02-24: qty 1

## 2019-02-24 MED ORDER — PROCHLORPERAZINE MALEATE 10 MG PO TABS
10.0000 mg | ORAL_TABLET | Freq: Once | ORAL | Status: AC
  Administered 2019-02-24: 12:00:00 10 mg via ORAL

## 2019-02-24 NOTE — Patient Instructions (Signed)
[facility] Discharge Instructions for Patients Receiving Chemotherapy  Today you received the following chemotherapy agents:  Velcade (bortezomib)  To help prevent nausea and vomiting after your treatment, we encourage you to take your nausea medication as prescribed.   If you develop nausea and vomiting that is not controlled by your nausea medication, call the clinic.   BELOW ARE SYMPTOMS THAT SHOULD BE REPORTED IMMEDIATELY:  *FEVER GREATER THAN 100.5 F  *CHILLS WITH OR WITHOUT FEVER  NAUSEA AND VOMITING THAT IS NOT CONTROLLED WITH YOUR NAUSEA MEDICATION  *UNUSUAL SHORTNESS OF BREATH  *UNUSUAL BRUISING OR BLEEDING  TENDERNESS IN MOUTH AND THROAT WITH OR WITHOUT PRESENCE OF ULCERS  *URINARY PROBLEMS  *BOWEL PROBLEMS  UNUSUAL RASH Items with * indicate a potential emergency and should be followed up as soon as possible.  Feel free to call the clinic should you have any questions or concerns. The clinic phone number is [phone].  Please show the CHEMO ALERT CARD at check-in to the Emergency Department and triage nurse.   

## 2019-02-28 ENCOUNTER — Inpatient Hospital Stay: Payer: Medicare HMO

## 2019-02-28 ENCOUNTER — Telehealth: Payer: Self-pay | Admitting: Pharmacist

## 2019-02-28 ENCOUNTER — Other Ambulatory Visit: Payer: Self-pay

## 2019-02-28 ENCOUNTER — Telehealth: Payer: Self-pay

## 2019-02-28 VITALS — BP 174/96 | HR 72 | Temp 98.7°F | Resp 16

## 2019-02-28 DIAGNOSIS — Z7982 Long term (current) use of aspirin: Secondary | ICD-10-CM | POA: Diagnosis not present

## 2019-02-28 DIAGNOSIS — M8580 Other specified disorders of bone density and structure, unspecified site: Secondary | ICD-10-CM | POA: Diagnosis not present

## 2019-02-28 DIAGNOSIS — Z5112 Encounter for antineoplastic immunotherapy: Secondary | ICD-10-CM | POA: Diagnosis not present

## 2019-02-28 DIAGNOSIS — K5900 Constipation, unspecified: Secondary | ICD-10-CM | POA: Diagnosis not present

## 2019-02-28 DIAGNOSIS — C9000 Multiple myeloma not having achieved remission: Secondary | ICD-10-CM

## 2019-02-28 DIAGNOSIS — I10 Essential (primary) hypertension: Secondary | ICD-10-CM | POA: Diagnosis not present

## 2019-02-28 DIAGNOSIS — C9110 Chronic lymphocytic leukemia of B-cell type not having achieved remission: Secondary | ICD-10-CM | POA: Diagnosis not present

## 2019-02-28 DIAGNOSIS — Z7189 Other specified counseling: Secondary | ICD-10-CM

## 2019-02-28 DIAGNOSIS — Z87891 Personal history of nicotine dependence: Secondary | ICD-10-CM | POA: Diagnosis not present

## 2019-02-28 DIAGNOSIS — Z79899 Other long term (current) drug therapy: Secondary | ICD-10-CM | POA: Diagnosis not present

## 2019-02-28 DIAGNOSIS — E041 Nontoxic single thyroid nodule: Secondary | ICD-10-CM | POA: Diagnosis not present

## 2019-02-28 LAB — CBC WITH DIFFERENTIAL/PLATELET
Abs Immature Granulocytes: 0.01 10*3/uL (ref 0.00–0.07)
Basophils Absolute: 0.1 10*3/uL (ref 0.0–0.1)
Basophils Relative: 1 %
Eosinophils Absolute: 0.1 10*3/uL (ref 0.0–0.5)
Eosinophils Relative: 2 %
HCT: 36.4 % (ref 36.0–46.0)
Hemoglobin: 11.4 g/dL — ABNORMAL LOW (ref 12.0–15.0)
Immature Granulocytes: 0 %
Lymphocytes Relative: 14 %
Lymphs Abs: 0.8 10*3/uL (ref 0.7–4.0)
MCH: 32 pg (ref 26.0–34.0)
MCHC: 31.3 g/dL (ref 30.0–36.0)
MCV: 102.2 fL — ABNORMAL HIGH (ref 80.0–100.0)
Monocytes Absolute: 0.6 10*3/uL (ref 0.1–1.0)
Monocytes Relative: 9 %
Neutro Abs: 4.5 10*3/uL (ref 1.7–7.7)
Neutrophils Relative %: 74 %
Platelets: 253 10*3/uL (ref 150–400)
RBC: 3.56 MIL/uL — ABNORMAL LOW (ref 3.87–5.11)
RDW: 14.7 % (ref 11.5–15.5)
WBC: 6 10*3/uL (ref 4.0–10.5)
nRBC: 0 % (ref 0.0–0.2)

## 2019-02-28 LAB — CMP (CANCER CENTER ONLY)
ALT: 14 U/L (ref 0–44)
AST: 12 U/L — ABNORMAL LOW (ref 15–41)
Albumin: 3.8 g/dL (ref 3.5–5.0)
Alkaline Phosphatase: 61 U/L (ref 38–126)
Anion gap: 8 (ref 5–15)
BUN: 12 mg/dL (ref 8–23)
CO2: 29 mmol/L (ref 22–32)
Calcium: 9 mg/dL (ref 8.9–10.3)
Chloride: 103 mmol/L (ref 98–111)
Creatinine: 0.67 mg/dL (ref 0.44–1.00)
GFR, Est AFR Am: >60 mL/min (ref >60)
GFR, Estimated: >60 mL/min (ref >60)
Glucose, Bld: 121 mg/dL — ABNORMAL HIGH (ref 70–99)
Potassium: 4.3 mmol/L (ref 3.5–5.1)
Sodium: 140 mmol/L (ref 135–145)
Total Bilirubin: 0.3 mg/dL (ref 0.3–1.2)
Total Protein: 6.8 g/dL (ref 6.5–8.1)

## 2019-02-28 MED ORDER — CYCLOPHOSPHAMIDE 50 MG PO CAPS: 300.0000 mg/m2 | ORAL_CAPSULE | ORAL | 2 refills | Status: DC

## 2019-02-28 MED ORDER — DEXAMETHASONE 4 MG PO TABS
ORAL_TABLET | ORAL | Status: AC
  Filled 2019-02-28: qty 4

## 2019-02-28 MED ORDER — BORTEZOMIB CHEMO SQ INJECTION 3.5 MG (2.5MG/ML)
1.3000 mg/m2 | Freq: Once | INTRAMUSCULAR | Status: AC
  Administered 2019-02-28: 2 mg via SUBCUTANEOUS
  Filled 2019-02-28: qty 0.8

## 2019-02-28 MED ORDER — PROCHLORPERAZINE MALEATE 10 MG PO TABS
ORAL_TABLET | ORAL | Status: AC
  Filled 2019-02-28: qty 1

## 2019-02-28 MED ORDER — PROCHLORPERAZINE MALEATE 10 MG PO TABS
10.0000 mg | ORAL_TABLET | Freq: Once | ORAL | Status: AC
  Administered 2019-02-28: 09:00:00 10 mg via ORAL

## 2019-02-28 MED ORDER — DEXAMETHASONE 4 MG PO TABS
ORAL_TABLET | ORAL | Status: AC
  Filled 2019-02-28: qty 1

## 2019-02-28 MED ORDER — DEXAMETHASONE 4 MG PO TABS
20.0000 mg | ORAL_TABLET | Freq: Once | ORAL | Status: AC
  Administered 2019-02-28: 09:00:00 20 mg via ORAL

## 2019-02-28 NOTE — Patient Instructions (Signed)
[facility] Discharge Instructions for Patients Receiving Chemotherapy  Today you received the following chemotherapy agents Velcade.  To help prevent nausea and vomiting after your treatment, we encourage you to take your nausea medication as directed.  If you develop nausea and vomiting that is not controlled by your nausea medication, call the clinic.   BELOW ARE SYMPTOMS THAT SHOULD BE REPORTED IMMEDIATELY:  *FEVER GREATER THAN 100.5 F  *CHILLS WITH OR WITHOUT FEVER  NAUSEA AND VOMITING THAT IS NOT CONTROLLED WITH YOUR NAUSEA MEDICATION  *UNUSUAL SHORTNESS OF BREATH  *UNUSUAL BRUISING OR BLEEDING  TENDERNESS IN MOUTH AND THROAT WITH OR WITHOUT PRESENCE OF ULCERS  *URINARY PROBLEMS  *BOWEL PROBLEMS  UNUSUAL RASH Items with * indicate a potential emergency and should be followed up as soon as possible.  Feel free to call the clinic should you have any questions or concerns. The clinic phone number is [phone].  Please show the CHEMO ALERT CARD at check-in to the Emergency Department and triage nurse.   

## 2019-02-28 NOTE — Telephone Encounter (Signed)
Oral Oncology Patient Advocate Encounter  Received notification from [facility] that prior authorization for Cyclophosphamide is required.  PA submitted on CoverMyMeds Key [id number] Status is pending  Oral Oncology Clinic will continue to follow.  Espy Patient [place] Phone [phone] Fax [phone] 02/28/2019    9:32 AM

## 2019-02-28 NOTE — Telephone Encounter (Signed)
Oral Chemotherapy Pharmacist Encounter   I spoke with patient for overview of: cyclophosphamide (cylophosphamide) for the treatment of multiple myeloma not having achieved remission in conjunction with Velcade and dexamethasone, planned duration until adequate disease control or unacceptable toxicity.  Counseled patient on administration, dosing, side effects, monitoring, drug-food interactions, safe handling, storage, and disposal.  Patient will take cyclophosphamide '50mg'$  capsules, 9 capsules ('450mg'$ ) by mouth once weekly, to be given on the days of Velcade injection.  Patient will take cyclophosphamide without regard to food, however, administration with food may decrease GI upset. Patient instructed to take cyclophosphamide early in the day and to maintain adequate hydration to prevent bladder toxicity.  Cyclophosphamide start date: 03/07/2019 Patient will take cyclophosphamide every week on Tuesdays.  Adverse effects include but are not limited to: decreased blood counts, nausea, vomiting, diarrhea, hair loss, and hemorrhagic cystitis.    Reviewed with patient importance of keeping a medication schedule and plan for any missed doses.  Medication reconciliation performed and medication/allergy list updated. Patient informed that she can discontinue use of loratadine and famotidine that were initiated due to Revlimid rash, as patient states that her rash is completely resolved.  Patient will take her first dose of cyclophosphamide without antinausea prophylaxis. She knows to use either ondansetron or prochlorperazine to treat nausea if it occurs. If nausea occurs with her first dose of cyclophosphamide she will initiate antinausea prophylaxis prior to each subsequent dose of cyclophosphamide.  Insurance authorization for cyclophosphamide has been obtained. Test claim at the pharmacy revealed copayment ~$40 for 1st fill of cyclophosphamide. This will ship from the [facility] on 03/01/19 to deliver to patient's home on 03/02/19. Patient will wait until Tuesday, 03/07/2019, when she is planned to take her dexamethasone pulse at home, and initiate cyclophosphamide on that day.  Patient informed the pharmacy will reach out 5-7 days prior to needing next fill of cyclophosphamide to coordinate continued medication acquisition to prevent break in therapy.  All questions answered.  Mrs. Voisin voiced understanding and appreciation.   Patient knows to call the office with questions or concerns.  Johny Drilling, PharmD, BCPS, BCOP  02/28/2019 3:37 PM Oral Oncology Clinic [phone]

## 2019-02-28 NOTE — Progress Notes (Signed)
Per Dr. Irene Limbo, [place] to treat with BP 174/96.

## 2019-02-28 NOTE — Telephone Encounter (Signed)
Oral Oncology Pharmacist Encounter  Received new prescription for Cytoxan (cylophosphamide) for the treatment of multiple myeloma not having achieved remission in conjunction with Velcade and dexamethasone, planned duration until adequate disease control or unacceptable toxicity.  Original diagnosis in Feb 2020, patient was started on Velcade, Revlimid, and dexamethasone at that time. Patient quickly experienced Revlimid related rash and Revlimid was discontinued in April 2020 after a brief, unsuccessful re-challenge Patient is now under evaluation to add cyclophosphamide to Velcade and dexamethasone induction regimen  Cyclophosphamide is planned to be administered at ~'300mg'$ /m2 ('450mg'$ ) by mouth once weekly  Labs from Epic assessed, OK for cyclophosphamide initiation.  Current medication list in Epic reviewed, no significant DDIs with Cytoxan identified.  Prescription has been e-scribed to the [employer] [facility] for benefits analysis and approval.  Oral Oncology Clinic will continue to follow for insurance authorization, copayment issues, initial counseling and start date.  Johny Drilling, PharmD, BCPS, BCOP  02/28/2019 8:44 AM Oral Oncology Clinic [phone]

## 2019-03-01 ENCOUNTER — Telehealth: Payer: Self-pay

## 2019-03-01 ENCOUNTER — Telehealth: Payer: Self-pay | Admitting: *Deleted

## 2019-03-01 DIAGNOSIS — R69 Illness, unspecified: Secondary | ICD-10-CM | POA: Diagnosis not present

## 2019-03-01 MED FILL — CYCLOPHOSPHAMIDE 50 MG CAP: 50 | 28 days supply | Qty: 36 | Fill #0

## 2019-03-01 NOTE — Telephone Encounter (Signed)
Oral Oncology Patient Advocate Encounter  [employer] Part D denied to allow Part B to be billed.  Part B paid their portion leaving the patient with a copay of $48.48.  [place] Patient [place] Phone [phone] Fax [phone] 03/01/2019   12:12 PM

## 2019-03-01 NOTE — Telephone Encounter (Signed)
Oral Oncology Patient Advocate Encounter  The patient brought in a letter from [place] stating that if her grant is not used by 03/20/19 then her account will be closed. This grant has to be used every 90 days in order for the grant to stay active. I reached out to [facility] to see if she is still using the grant for the patient and what the balance was.  I called [facility] and got the pharmacy billing information to cover the copay of the Cyclophosphamide today which is $48.48 so that she will not lose her grant. I will wait on a reply from [place] to see if we need to continue to use this grant for her. [facility] pharmacy billing information is as follows and has been shared with [place]. BIN: [id number] PCN: [id number] Grp: [id number] ID: [id number] Expires 12/20/2019  [place] Patient [employer] Phone [phone] Fax [phone] 03/01/2019   10:01 AM

## 2019-03-01 NOTE — Telephone Encounter (Signed)
Have attempted to contact patient x2 this am without success. Sent a message to Dory [place], Nutrition specialist w/[facility] requesting her to reach out to patient to offer education and support.

## 2019-03-01 NOTE — Telephone Encounter (Signed)
Nutrition Assessment   Reason for Assessment:   Referral received from [facility], RN regarding patient concern for weight loss.   ASSESSMENT:  78 year old female with multiple myeloma followed by Dr. Irene Limbo.  Past medical history of HTN, DM, glaucoma.    Spoke with patient via phone this pm.  Patient reports that appetite has been good.  Reports typically eats oatmeal with protein powder added, almond milk and prunes for breakfast.  AM snack is fruit or piece of toast or crackers and lunch is sandwich (ie egg) or leftovers from supper.  PM snack is fruit.  Supper is [place] burger and veggies or spaghetti and veggies. Most drinks water.  Husband mixes her a protein powder with water or almond milk to drink with medications in the am.   Reports that she snacked on potato chips and tortilla chips and recently cut back on that due to swelling in legs. Continues on lasix and reports some fluid loss but not completely resolved.  Denies issues with nausea, constipation, diarrhea.  Reports distended stomach but passing gas and stool on regular basis.  Reports MD and nursing is aware.  Reports did recently cut back on pain medications for fear of getting addicted and was in more pain that normal recently which may have caused her to eat less.  Has gone back to regular pain regimen and pain is better.  Reports that she is active (walks) around her house.     Nutrition Focused Physical Exam: deferred   Medications: lasix, MVI, KLC, senokot, miralax, Vit D   Labs: reviewed   Anthropometrics:   Height: 62.5 inches Weight: 110 lb 4 oz on 5/19 [facility]) Reported weight 100 lb 6 oz today on home scale per patient.   Reports on home scale usually has on pajamas.  Home weight on 5/19 was 107 lb per patient.  Weight started declining on 5/22 (105 lb) BMI: 19  9% weight loss in the last 8 days, significant   Estimated Energy Needs  Kcals: 1500-1750 Protein: 75-88 g Fluid: 1.5 L   NUTRITION DIAGNOSIS:  Inadequate oral intake related to pain (decreased pain medications), cancer, change in eating pattern (cut out chips) as evidenced by 9% weight loss in 8 days, significant.    INTERVENTION:  Discussed ways to increase calories and protein in current eating pattern (adding condiments, dressing, sauces, olive oil) Discussed higher calories snacks (unsalted nuts, nut butters and fruit, trail mix, etc) Encouraged small frequent meals/snack during the day.  Contact information provided and encouraged patient to call RD with more questions or concerns   MONITORING, EVALUATION, GOAL: Patient will consume adequate calories and protein to prevent further weight los   Next Visit: patient to contact RD  Kasie Leccese B. Zenia Resides, [place], [place] Registered Dietitian [phone] (pager)

## 2019-03-01 NOTE — Telephone Encounter (Signed)
Contacted office. Reports weight is decreasing, she reports currently 99.6 lbs. Most recent at [facility] 5/19 110 lbs,4.8oz.  She wanted Dr. Irene Limbo to know. She has made a few dietary changes, but not attempting to lose weight.

## 2019-03-01 NOTE — Telephone Encounter (Signed)
Oral Oncology Patient Advocate Encounter  Confirmed with [place] that Cyclophosphamide was shipped on 5/27 with a $0 copay using [facility] grant.   [place] Patient [place] Phone [phone] Fax [phone] 03/01/2019   3:41 PM

## 2019-03-03 ENCOUNTER — Inpatient Hospital Stay: Payer: Medicare HMO

## 2019-03-03 ENCOUNTER — Other Ambulatory Visit: Payer: Self-pay

## 2019-03-03 VITALS — BP 160/104 | HR 74 | Temp 99.0°F | Resp 16

## 2019-03-03 DIAGNOSIS — C9000 Multiple myeloma not having achieved remission: Secondary | ICD-10-CM

## 2019-03-03 DIAGNOSIS — M8580 Other specified disorders of bone density and structure, unspecified site: Secondary | ICD-10-CM | POA: Diagnosis not present

## 2019-03-03 DIAGNOSIS — E041 Nontoxic single thyroid nodule: Secondary | ICD-10-CM | POA: Diagnosis not present

## 2019-03-03 DIAGNOSIS — Z7189 Other specified counseling: Secondary | ICD-10-CM

## 2019-03-03 DIAGNOSIS — C9110 Chronic lymphocytic leukemia of B-cell type not having achieved remission: Secondary | ICD-10-CM | POA: Diagnosis not present

## 2019-03-03 DIAGNOSIS — Z79899 Other long term (current) drug therapy: Secondary | ICD-10-CM | POA: Diagnosis not present

## 2019-03-03 DIAGNOSIS — Z5112 Encounter for antineoplastic immunotherapy: Secondary | ICD-10-CM | POA: Diagnosis not present

## 2019-03-03 DIAGNOSIS — Z7982 Long term (current) use of aspirin: Secondary | ICD-10-CM | POA: Diagnosis not present

## 2019-03-03 DIAGNOSIS — I10 Essential (primary) hypertension: Secondary | ICD-10-CM | POA: Diagnosis not present

## 2019-03-03 DIAGNOSIS — K5900 Constipation, unspecified: Secondary | ICD-10-CM | POA: Diagnosis not present

## 2019-03-03 DIAGNOSIS — Z87891 Personal history of nicotine dependence: Secondary | ICD-10-CM | POA: Diagnosis not present

## 2019-03-03 MED ORDER — PROCHLORPERAZINE MALEATE 10 MG PO TABS
ORAL_TABLET | ORAL | Status: AC
  Filled 2019-03-03: qty 1

## 2019-03-03 MED ORDER — PROCHLORPERAZINE MALEATE 10 MG PO TABS
10.0000 mg | ORAL_TABLET | Freq: Once | ORAL | Status: AC
  Administered 2019-03-03: 09:00:00 10 mg via ORAL

## 2019-03-03 MED ORDER — BORTEZOMIB CHEMO SQ INJECTION 3.5 MG (2.5MG/ML)
1.3000 mg/m2 | Freq: Once | INTRAMUSCULAR | Status: AC
  Administered 2019-03-03: 10:00:00 2 mg via SUBCUTANEOUS
  Filled 2019-03-03: qty 0.8

## 2019-03-03 NOTE — Progress Notes (Signed)
Per Dr. Irene Limbo - OK to treat today with current b/p measurements

## 2019-03-03 NOTE — Patient Instructions (Signed)
[facility] Discharge Instructions for Patients Receiving Chemotherapy  Today you received the following chemotherapy agents Velcade.  To help prevent nausea and vomiting after your treatment, we encourage you to take your nausea medication as directed.  If you develop nausea and vomiting that is not controlled by your nausea medication, call the clinic.   BELOW ARE SYMPTOMS THAT SHOULD BE REPORTED IMMEDIATELY:  *FEVER GREATER THAN 100.5 F  *CHILLS WITH OR WITHOUT FEVER  NAUSEA AND VOMITING THAT IS NOT CONTROLLED WITH YOUR NAUSEA MEDICATION  *UNUSUAL SHORTNESS OF BREATH  *UNUSUAL BRUISING OR BLEEDING  TENDERNESS IN MOUTH AND THROAT WITH OR WITHOUT PRESENCE OF ULCERS  *URINARY PROBLEMS  *BOWEL PROBLEMS  UNUSUAL RASH Items with * indicate a potential emergency and should be followed up as soon as possible.  Feel free to call the clinic should you have any questions or concerns. The clinic phone number is [phone].  Please show the CHEMO ALERT CARD at check-in to the Emergency Department and triage nurse.   

## 2019-03-07 ENCOUNTER — Telehealth: Payer: Self-pay | Admitting: *Deleted

## 2019-03-07 NOTE — Telephone Encounter (Signed)
Late entry for 03/06/2019: Patient called with several questions for MD: Answers given to patient in bold. - Does she continue Pepcid and Claritin since no longer on Revlimid? Per Dr. Irene Limbo: she can stop those meds - Does she continue Lasix and potassium since legs are barely swollen? Per Dr.Kale: If swelling in lower legs has gone, no need to continue them. If swelling recurs, notify office. - Patient reported b/p at home lower than taken at [facility] 130/90 to 152/96. Dr. Irene Limbo informed. Patient verbalized understanding of all medication directions.

## 2019-03-07 NOTE — Telephone Encounter (Signed)
Oral Oncology Patient Advocate Encounter  The [facility] grant is being used for medical copays. There is a balance of $10,935.91.   I will take this grant out of the pharmacy billing system as the [place] will be plenty to cover her copays for the year. We used the [employer] for her first fill to keep the grant from expiring on 03-20-19. The grant is now ok for another 90 days without being used.  I called the patient and explained this to her, she verbalized understanding and great appreciation.  The patient was also concerned about a letter she received stating that her cyclophosphamide had been denied for Part D. I explained that in order for her Part B to pay a portion of the cyclophosphamide the Part D had to deny covering the cost. She verbalized understanding of this.  [place] Patient [place] Phone [phone] Fax [phone] 03/07/2019   9:38 AM

## 2019-03-09 ENCOUNTER — Telehealth: Payer: Self-pay | Admitting: *Deleted

## 2019-03-09 ENCOUNTER — Other Ambulatory Visit: Payer: Self-pay | Admitting: *Deleted

## 2019-03-09 MED ORDER — OXYCODONE-ACETAMINOPHEN 5-325 MG PO TABS: 1.0000 | ORAL_TABLET | ORAL | 0 refills | Status: DC | PRN

## 2019-03-09 NOTE — Telephone Encounter (Signed)
Per Dr.Kale, no need to continue Lasix if swelling is minimal. If no Lasix, will not need potassium. Patient verbalized understanding.

## 2019-03-09 NOTE — Telephone Encounter (Signed)
Patient called. Still having some minor leg swelling so she wants to know if she should still stop the lasix as directed or continue it? Also requested refill of Percocet - will be out this weekend. Question sent to Dr. Irene Limbo. Refill request entered.

## 2019-03-09 NOTE — Telephone Encounter (Signed)
Patient requests refill of pain pills. Will be out on Saturday.

## 2019-03-13 NOTE — Progress Notes (Signed)
HEMATOLOGY/ONCOLOGY CLINIC NOTE  Date of Service: 03/14/2019  Patient Care Team: [name], MD as PCP - General (Family Medicine)  Dr. Ronney Lion as [place] Specialist  [phone]  CHIEF COMPLAINTS/PURPOSE OF CONSULTATION:   Multiple Myeloma- continued management  HISTORY OF PRESENTING ILLNESS:   Megan Olson is a wonderful 78 y.o. female who has been referred to [place] by Dr. 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 She is accompanied today by her husband. The pt reports that she is doing well overall.   The pt notes that she was doing extensive yard work in October 2019, and began to feel back pain a few days after this, which she attributed to muscle pain. She recalls taking a deep breath, and developing sudden acute pain, and presented to care with her PCP's office. She began exercises for her back pain, without relief, then began PT without relief, then was referred to Dr. Paulla Fore in sports medicine in late January 2020. She had an XR which revealed a compression fracture at T11, then subsequently had an MRI, and a bone marrow biopsy. The pt notes that her back pain "seemed to move around." She endorses pain "up the whole left side" of her back.  The pt notes that she is not able to stand up straight due to her back pain, which she feels limits her ability to take a deep breath, and endorses pain exacerbation when she does take a deep breath. The pt needs assistance from sitting to lying from her husband. She is using about 3 Percocet a day.  The pt notes worsened constipation since beginning Percocet, and notes that her most recent laxative order was not covered by her insurance. She is using prune juice and milk of magnesia. She took Senokot S BID without relief.  The pt reports that prior to her recent back pain, she had very few medical concerns. She endorses controlled BP, and has been monitored for DM but  after closely watching her diet her A1C decreased to 5.8. She has glaucoma, and has had surgery in both eyes. Right eye with stent. The pt sees Dr. Edilia Bo at [facility] for her eye care. The pt notes that her vision has been recently "pretty good." The pt notes that she has been advised to limit treatment with steroids for her glaucoma. She denies heart or lung problems. Denies previous back problems. Last DEXA scan 3 years ago, and endorses osteopenia. She notes that she took Fosamax for 3-4 years, and stopped 5-6 years ago. She takes Vitamin D, a multivitamin, and magnesium.  The pt notes that she quit smoking cigarettes when she was 27, started when she was about 20. The pt consumes alcohol rarely, but not since beginning narcotics. She previously worked in [employer].  Of note prior to the patient's visit today, pt has had an MRI Thoracic Spine completed on 11/27/18 with results revealing Multifocal marrow signal abnormality consistent with metastatic disease or multiple myeloma. The patient has multiple compression fractures most consistent with pathologic injuries. Extensive marrow signal abnormality makes determining age of the fractures difficult but edema is most intense in T3. 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  Most recent lab results (12/01/18) of CBC w/diff is as follows: all values are WNL except for RBC at 3.17, HGB at 10.4, HCT at 33.5, MCV at  105.7. 11/28/18 CMP revealed all values WNL except for Glucose at 105, Total Protein at 10.2, Albumin at 3.1 11/30/18 24HR UPEP revealed all values WNL except for M-spike at 75.1%. 11/28/18 Bega-2 microglobulin slightly elevated at 2.6 11/28/18 SPEP revealed M spike at 4.6g 11/28/18 Immunoglobulins revealed IgG at 6181, IgA at 24, IgM at 16, and IgE at 6.  On review of systems, pt reports constipation, back pain, stable energy levels, ankle swelling,  tenderness at T3, lower back pain, and denies abdominal pains, neck pain, and any other symptoms.   On PMHx the pt reports redundant colon, single hemorrhoid, glaucoma, HTN, osteopenia, Tonsillectomy, right eye stent and multiple eye surgeries. On Social Hx the pt reports rare alcohol use, smoked cigarettes between ages 51 and 40. 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 On Family Hx the pt reports sister died from small cell lung cancer and was a lifelong smoker, brother's daughter died of breast cancer with BRCA1 and BRCA2 mutations. Cousin with female breast cancer.  Interval History:   Megan Olson returns today for management and evaluation of her recently diagnosed multiple myeloma. The patient's last visit with [place] was on 02/21/19. The pt reports that she is doing well overall.  The pt reports that she received her second dose of Cytoxan today and notes that she has not had any nausea, skin rashes, or diarrhea. She notes that she is tolerating Cytoxan well. She is ambulating better, and is now walking through her house without her cane. She continues being careful, and walks with the assistance of her husband in their garden. She continues with PT exercises at home though her in-home PT has ended.  She notes that she is moving her bowels well and denies abdominal pains. The pt also notes that her ankle swelling has now resolved and she has continued elevating her legs. She notes that she has continued eating well.  Lab results today (03/14/19) of CBC w/diff and CMP is as follows: all values are WNL except for RBC at 3.33, HGB at 10.6, HCT at 33.0, Sodium at 134, Glucose at 118, Calcium at 8.6, Total Protein at 6.4, AST at 13.  On review of systems, pt reports improving strength, eating well, increasing activity levels, moving her bowels well, and denies skin rashes, nausea, diarrhea, abdominal pains, leg swelling, and any other symptoms.   MEDICAL HISTORY:  Past Medical History:  Diagnosis Date    Cancer [facility])    multiple myeloma   Glaucoma    HYPERTENSION 03/11/2007   OSTEOPENIA 03/11/2007    SURGICAL HISTORY: Past Surgical History:  Procedure Laterality Date   BREAST EXCISIONAL BIOPSY Right 2000   BREAST LUMPECTOMY  1990   benign   DILATION AND CURETTAGE OF UTERUS     bleeding at menopause. No uterine cancer   IR RADIOLOGIST EVAL & MGMT  12/13/2018   TONSILLECTOMY     age 74    SOCIAL HISTORY: Social History   Socioeconomic History   Marital status: Married    Spouse name: Not on file   Number of children: Not on file   Years of education: Not on file   Highest education level: Not on file  Occupational History   Not on file  Social Needs   Financial resource strain: Not on file   Food insecurity:    Worry: Not on file    Inability: Not on file   Transportation needs:    Medical: Not on file    Non-medical: Not on file  Tobacco Use   Smoking status: Former Smoker    Packs/day: 0.50    Years: 7.00    Pack years: 3.50    Types: Cigarettes    Last attempt to quit: 01/04/1961    Years since quitting: 58.2   Smokeless tobacco: Never Used  Substance and Sexual Activity   Alcohol use: Yes    Alcohol/week: 1.0 standard drinks    Types: 1 Standard drinks or equivalent per week   Drug use: No   Sexual activity: Not Currently  Lifestyle   Physical activity:    Days per week: Not on file    Minutes per session: Not on file   Stress: Not on file  Relationships   Social connections:    Talks on phone: Not on file    Gets together: Not on file    Attends religious service: Not on file    Active member of club or organization: Not on file    Attends meetings of clubs or organizations: Not on file    Relationship status: Not on file   Intimate partner violence:    Fear of current or ex partner: Not on file    Emotionally abused: Not on file    Physically abused: Not on file    Forced sexual activity: Not on file  Other Topics Concern     Not on file  Social History Narrative   Married. Lives with husband (patient of Dr. Yong Channel). 1 son. No grandkids. 1 granddog.       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, swing dancing, dinner, read       FAMILY HISTORY: Family History  Problem Relation Age of Onset   Heart disease Mother        CHF mother died 15   Arthritis Mother    Glaucoma Mother        sister as well   Alcohol abuse Father    Suicidality Father    Cancer Sister        lung cancer, smoker   Heart disease Sister        aortic valve replacement   Hyperlipidemia Brother    Hypertension Brother    COPD Brother    Arthritis Sister    Hypertension Sister    Glaucoma Sister    Hashimoto's thyroiditis Sister    Hypertension Son    Stroke Maternal Grandmother    Colon cancer Neg Hx    Colon polyps Neg Hx     ALLERGIES:  is allergic to ace inhibitors; benadryl [diphenhydramine]; diamox [acetazolamide]; sulfamethoxazole; and penicillins.  MEDICATIONS:  Current Outpatient Medications  Medication Sig Dispense Refill   acyclovir (ZOVIRAX) 400 MG tablet Take 1 tablet (400 mg total) by mouth 2 (two) times daily. 60 tablet 3   ALPHAGAN P 0.1 % SOLN Apply 1 drop topically See admin instructions. Apply 2 drops in right eye 3 times a day     amLODipine (NORVASC) 5 MG tablet Take 1 tablet (5 mg total) by mouth daily. 90 tablet 3   aspirin EC 81 MG tablet Take 81 mg by mouth daily.     bimatoprost (LUMIGAN) 0.03 % ophthalmic solution Place 1 drop into the right eye at bedtime.      Cholecalciferol (VITAMIN D3) 1000 UNITS CAPS Take 1,000 Units by mouth 2 (two) times daily.      co-enzyme Q-10 50 MG capsule Take 50 mg  by mouth daily.       cyclophosphamide (CYTOXAN) 50 MG capsule Take 9 capsules (450 mg total) by mouth once a week. Take with food to minimize GI upset. Take early in the day and maintain  hydration. 36 capsule 2   dexamethasone (DECADRON) 4 MG tablet '20mg'$  (5 tabs) on Day 15 of each cycle of treatment 15 tablet 3   dorzolamide-timolol (COSOPT) 22.3-6.8 MG/ML ophthalmic solution Place 2 drops into both eyes 2 (two) times daily.   11   fentaNYL (DURAGESIC) 25 MCG/HR Place 1 patch onto the skin every 3 (three) days. 10 patch 0   furosemide (LASIX) 20 MG tablet Take 1 tablet (20 mg total) by mouth daily. (Patient not taking: Reported on 03/14/2019) 30 tablet 1   hydrOXYzine (ATARAX/VISTARIL) 25 MG tablet Take 1 tablet (25 mg total) by mouth 3 (three) times daily as needed. 30 tablet 0   magnesium hydroxide (MILK OF MAGNESIA) 400 MG/5ML suspension Take 15 mLs by mouth daily as needed for mild constipation or moderate constipation. (Patient not taking: Reported on 03/14/2019) 355 mL 0   Magnesium Oxide 500 MG TABS Take 1 tablet by mouth daily.       Multiple Vitamins-Minerals (MULTIVITAMIN WITH MINERALS) tablet Take 1 tablet by mouth daily.       ondansetron (ZOFRAN) 8 MG tablet Take 1 tablet (8 mg total) by mouth 2 (two) times daily as needed (Nausea or vomiting). 30 tablet 1   oxyCODONE-acetaminophen (PERCOCET) 5-325 MG tablet Take 1-2 tablets by mouth every 4 (four) hours as needed for moderate pain or severe pain. 90 tablet 0   polyethylene glycol (MIRALAX) packet Take 17 g by mouth daily. 30 each 1   potassium chloride SA (K-DUR) 20 MEQ tablet Take 1 tablet (20 mEq total) by mouth daily. (Patient not taking: Reported on 03/14/2019) 30 tablet 1   prochlorperazine (COMPAZINE) 10 MG tablet Take 1 tablet (10 mg total) by mouth every 6 (six) hours as needed (Nausea or vomiting). 30 tablet 1   senna-docusate (SENOKOT-S) 8.6-50 MG tablet Take 2 tablets by mouth at bedtime. 60 tablet 2   vitamin C (ASCORBIC ACID) 500 MG tablet Take 500 mg by mouth daily.     No current facility-administered medications for this visit.     REVIEW OF SYSTEMS:    A 10+ POINT REVIEW OF SYSTEMS WAS  OBTAINED including neurology, dermatology, psychiatry, cardiac, respiratory, lymph, extremities, GI, GU, Musculoskeletal, constitutional, breasts, reproductive, HEENT.  All pertinent positives are noted in the HPI.  All others are negative.   PHYSICAL EXAMINATION: ECOG PERFORMANCE STATUS: 1-2  Vitals:   03/14/19 1406  BP: (!) 163/82  Pulse: 74  Resp: 18  Temp: 98.1 F (36.7 C)  SpO2: 100%   Filed Weights   03/14/19 1406  Weight: 101 lb (45.8 kg)   .Body mass index is 18.18 kg/m.  GENERAL:alert, in no acute distress and comfortable SKIN: no acute rashes, no significant lesions EYES: conjunctiva are pink and non-injected, sclera anicteric OROPHARYNX: MMM, no exudates, no oropharyngeal erythema or ulceration NECK: supple, no JVD LYMPH:  no palpable lymphadenopathy in the cervical, axillary or inguinal regions LUNGS: clear to auscultation b/l with normal respiratory effort HEART: regular rate & rhythm ABDOMEN:  normoactive bowel sounds , non tender, not distended. No palpable hepatosplenomegaly.  Extremity: no pedal edema PSYCH: alert & oriented x 3 with fluent speech NEURO: no focal motor/sensory deficits   LABORATORY DATA:  I have reviewed the data as listed  . CBC  Latest Ref Rng & Units 03/14/2019 02/28/2019 02/21/2019  WBC 4.0 - 10.5 K/uL 5.6 6.0 4.9  Hemoglobin 12.0 - 15.0 g/dL 10.6(L) 11.4(L) 10.7(L)  Hematocrit 36.0 - 46.0 % 33.0(L) 36.4 34.2(L)  Platelets 150 - 400 K/uL 353 253 401(H)    . CMP Latest Ref Rng & Units 03/14/2019 02/28/2019 02/21/2019  Glucose 70 - 99 mg/dL 118(H) 121(H) 117(H)  BUN 8 - 23 mg/dL '13 12 12  '$ Creatinine 0.44 - 1.00 mg/dL 0.64 0.67 0.71  Sodium 135 - 145 mmol/L 134(L) 140 140  Potassium 3.5 - 5.1 mmol/L 4.2 4.3 4.2  Chloride 98 - 111 mmol/L 101 103 102  CO2 22 - 32 mmol/L '25 29 29  '$ Calcium 8.9 - 10.3 mg/dL 8.6(L) 9.0 9.0  Total Protein 6.5 - 8.1 g/dL 6.4(L) 6.8 6.9  Total Bilirubin 0.3 - 1.2 mg/dL 0.3 0.3 0.3  Alkaline Phos 38 - 126 U/L  46 61 80  AST 15 - 41 U/L 13(L) 12(L) 13(L)  ALT 0 - 44 U/L '12 14 14        '$ 12/01/18 BM Bx:   12/01/18 Flow Cytometry:       RADIOGRAPHIC STUDIES: I have personally reviewed the radiological images as listed and agreed with the findings in the report. No results found.  ASSESSMENT & PLAN:   78 y.o. female with  1. Multiple Myeloma with IgG Lambda specificity Labs upon initial presentation from 12/01/18 CBC w/diff revealed HGB at 10.4 with MCV of 105.7. 11/28/18 CMP revealed Creatinine normal at 0.68 and Calcium normal at 8.9. 11/28/18 Beta 2 microglobulin at 2.'6mg'$  (also a reading at 4.'7mg'$  on the same day). 11/30/18 24HR UPEP revealed M spike at 75.1%. 11/28/18 SPEP revealed M spike of 4.6g. 11/28/18 Immunoglobulins revealed IgG elevated at '6181mg'$ .  12/01/18 BM Bx revealed hypercellular bone marrow with 70-80% CD138 immunohistochemistry (44% aspirate) lambda-restricted plasma cells as well as a kappa restricted population of B-cells 11/27/18 MRI Thoracic Spine which revealed Multifocal marrow signal abnormality consistent with metastatic disease or multiple myeloma. The patient has multiple compression fractures most consistent with pathologic injuries. Extensive marrow signal abnormality makes determining age of the fractures difficult but edema is most intense in T3. 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 2. Areas of hypermetabolic disease identified in both lungs suggesting multiple myeloma involvement. 3. 1.9 cm calcified left thyroid nodule is hypermetabolic, but Indeterminate. 4. Colon is diffusely distended with gas and stool. Imaging  features would be compatible with clinical constipation. 5.  Aortic Atherosclerois.  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 She does not currently have elevated lymphocyte counts on peripheral blood draws.  PLAN: -Discussed pt labwork today, 03/14/19; blood counts and chemistries are stable. -02/21/19 MMP shows M protein of 1. Last M Protein improved to 1.2g on 01/31/19 from 4.3g pretreatment. -12/20/18 Vitamin D appropriate at 64.2 -The pt has  no prohibitive toxicities from continuing C5D1 Velcade, '450mg'$  Cyclophosphamide, and Dexamethasone at this time. -Will monitor counts for consideration of increasing dose of Cytoxan -Advised that pt continue avoiding pushing, pulling, bending and lifting to avoid stress on load bearing bones and joints -Continue to focus on in home PT and nutrition -Will order [place] of thyroid in 1-2 months -Discussed that I do not recommend a biopsy of the left lung nodule at this point, but will look for response in this area as treatment continues. Will monitor and discussed that I cannot rule out a primary lung cancer at this point, but that this is likely related to her myeloma. -Following with Dr Edilia Bo for mx of her glaucoma and monitoring in the setting of steroid use. -Recommended moisturizing legs -Continue leg elevation for leg swelling and graded sports compression socks -Continue low dose Lasix as needed and 17mq Potassium replacement -Advised that pt weigh herself while on Lasix -Continue '81mg'$  aspirin -Continue '400mg'$  Acyclovir BID -Continue 281m/hr Fentanyl patch to smoothen pain control -Continue  Oxycodone 1-2 pills every 4-6 hours as needed -Continue two tablets of Senna S at night time and daily Miralax, backing off if diarrhea develops or OTC Fleet enema -Continue Xgeva every 4 weeks, pt denies current dental concerns -Pt has at[facility] nursing [facility] the [facility] eat very well and continue to stay well hydrate, and stay as active as she can reasonably manage to -Will see the pt back with C6D1   continue treatment and followup for C5 and plz schedule C6 of treatment -continue XgMarchelle FolksMD visit on C6D1   All of the patients questions were answered with apparent satisfaction. The patient knows to call the clinic with any problems, questions or concerns.  The total time spent in the appt was 25 minutes and more than 50% was on counseling and direct patient cares.    GaSullivan LoneD MS AAHIVMS SCKaiser Fnd Hosp - Rehabilitation Center VallejoTRiveredge Hospitalematology/Oncology Physician CoDel[facility] Center(Office):       [phone] cell):  [phone]):           [phone]/06/2019 2:44 PM  I, ScBaldwin Jamaicaam acting as a scribe for Dr. GaSullivan Lone  .I have reviewed the above documentation for accuracy and completeness, and I agree with the above. .GBrunetta GeneraD

## 2019-03-14 ENCOUNTER — Inpatient Hospital Stay (HOSPITAL_BASED_OUTPATIENT_CLINIC_OR_DEPARTMENT_OTHER): Payer: Medicare HMO | Admitting: Hematology

## 2019-03-14 ENCOUNTER — Other Ambulatory Visit: Payer: Self-pay

## 2019-03-14 ENCOUNTER — Inpatient Hospital Stay: Payer: Medicare HMO

## 2019-03-14 ENCOUNTER — Inpatient Hospital Stay: Payer: Medicare HMO | Attending: Hematology

## 2019-03-14 VITALS — BP 163/82 | HR 74 | Temp 98.1°F | Resp 18 | Ht 62.5 in | Wt 101.0 lb

## 2019-03-14 VITALS — BP 155/77

## 2019-03-14 DIAGNOSIS — Z87891 Personal history of nicotine dependence: Secondary | ICD-10-CM | POA: Diagnosis not present

## 2019-03-14 DIAGNOSIS — M8580 Other specified disorders of bone density and structure, unspecified site: Secondary | ICD-10-CM | POA: Diagnosis not present

## 2019-03-14 DIAGNOSIS — Z7982 Long term (current) use of aspirin: Secondary | ICD-10-CM | POA: Insufficient documentation

## 2019-03-14 DIAGNOSIS — Z79899 Other long term (current) drug therapy: Secondary | ICD-10-CM | POA: Diagnosis not present

## 2019-03-14 DIAGNOSIS — I10 Essential (primary) hypertension: Secondary | ICD-10-CM | POA: Diagnosis not present

## 2019-03-14 DIAGNOSIS — Z803 Family history of malignant neoplasm of breast: Secondary | ICD-10-CM | POA: Insufficient documentation

## 2019-03-14 DIAGNOSIS — Z5111 Encounter for antineoplastic chemotherapy: Secondary | ICD-10-CM | POA: Diagnosis not present

## 2019-03-14 DIAGNOSIS — M549 Dorsalgia, unspecified: Secondary | ICD-10-CM | POA: Insufficient documentation

## 2019-03-14 DIAGNOSIS — R600 Localized edema: Secondary | ICD-10-CM | POA: Diagnosis not present

## 2019-03-14 DIAGNOSIS — C9000 Multiple myeloma not having achieved remission: Secondary | ICD-10-CM

## 2019-03-14 DIAGNOSIS — Z801 Family history of malignant neoplasm of trachea, bronchus and lung: Secondary | ICD-10-CM

## 2019-03-14 DIAGNOSIS — K5900 Constipation, unspecified: Secondary | ICD-10-CM | POA: Diagnosis not present

## 2019-03-14 DIAGNOSIS — Z7189 Other specified counseling: Secondary | ICD-10-CM

## 2019-03-14 LAB — CBC WITH DIFFERENTIAL/PLATELET
Abs Immature Granulocytes: 0.01 10*3/uL (ref 0.00–0.07)
Basophils Absolute: 0.1 10*3/uL (ref 0.0–0.1)
Basophils Relative: 1 %
Eosinophils Absolute: 0.1 10*3/uL (ref 0.0–0.5)
Eosinophils Relative: 2 %
HCT: 33 % — ABNORMAL LOW (ref 36.0–46.0)
Hemoglobin: 10.6 g/dL — ABNORMAL LOW (ref 12.0–15.0)
Immature Granulocytes: 0 %
Lymphocytes Relative: 16 %
Lymphs Abs: 0.9 10*3/uL (ref 0.7–4.0)
MCH: 31.8 pg (ref 26.0–34.0)
MCHC: 32.1 g/dL (ref 30.0–36.0)
MCV: 99.1 fL (ref 80.0–100.0)
Monocytes Absolute: 0.5 10*3/uL (ref 0.1–1.0)
Monocytes Relative: 9 %
Neutro Abs: 4 10*3/uL (ref 1.7–7.7)
Neutrophils Relative %: 72 %
Platelets: 353 10*3/uL (ref 150–400)
RBC: 3.33 MIL/uL — ABNORMAL LOW (ref 3.87–5.11)
RDW: 14.3 % (ref 11.5–15.5)
WBC: 5.6 10*3/uL (ref 4.0–10.5)
nRBC: 0 % (ref 0.0–0.2)

## 2019-03-14 LAB — CMP (CANCER CENTER ONLY)
ALT: 12 U/L (ref 0–44)
AST: 13 U/L — ABNORMAL LOW (ref 15–41)
Albumin: 3.9 g/dL (ref 3.5–5.0)
Alkaline Phosphatase: 46 U/L (ref 38–126)
Anion gap: 8 (ref 5–15)
BUN: 13 mg/dL (ref 8–23)
CO2: 25 mmol/L (ref 22–32)
Calcium: 8.6 mg/dL — ABNORMAL LOW (ref 8.9–10.3)
Chloride: 101 mmol/L (ref 98–111)
Creatinine: 0.64 mg/dL (ref 0.44–1.00)
GFR, Est AFR Am: >60 mL/min (ref >60)
GFR, Estimated: >60 mL/min (ref >60)
Glucose, Bld: 118 mg/dL — ABNORMAL HIGH (ref 70–99)
Potassium: 4.2 mmol/L (ref 3.5–5.1)
Sodium: 134 mmol/L — ABNORMAL LOW (ref 135–145)
Total Bilirubin: 0.3 mg/dL (ref 0.3–1.2)
Total Protein: 6.4 g/dL — ABNORMAL LOW (ref 6.5–8.1)

## 2019-03-14 MED ORDER — PROCHLORPERAZINE MALEATE 10 MG PO TABS
ORAL_TABLET | ORAL | Status: AC
  Filled 2019-03-14: qty 1

## 2019-03-14 MED ORDER — BORTEZOMIB CHEMO SQ INJECTION 3.5 MG (2.5MG/ML)
1.3000 mg/m2 | Freq: Once | INTRAMUSCULAR | Status: AC
  Administered 2019-03-14: 2 mg via SUBCUTANEOUS
  Filled 2019-03-14: qty 0.8

## 2019-03-14 MED ORDER — PROCHLORPERAZINE MALEATE 10 MG PO TABS
10.0000 mg | ORAL_TABLET | Freq: Once | ORAL | Status: AC
  Administered 2019-03-14: 16:00:00 10 mg via ORAL

## 2019-03-14 MED ORDER — DEXAMETHASONE 4 MG PO TABS
ORAL_TABLET | ORAL | Status: AC
  Filled 2019-03-14: qty 5

## 2019-03-14 MED ORDER — DEXAMETHASONE 4 MG PO TABS
20.0000 mg | ORAL_TABLET | Freq: Once | ORAL | Status: AC
  Administered 2019-03-14: 20 mg via ORAL

## 2019-03-14 NOTE — Patient Instructions (Signed)
[facility] Discharge Instructions for Patients Receiving Chemotherapy  Today you received the following chemotherapy agent: Velcade  To help prevent nausea and vomiting after your treatment, we encourage you to take your nausea medication as directed by your MD.   If you develop nausea and vomiting that is not controlled by your nausea medication, call the clinic.   BELOW ARE SYMPTOMS THAT SHOULD BE REPORTED IMMEDIATELY:  *FEVER GREATER THAN 100.5 F  *CHILLS WITH OR WITHOUT FEVER  NAUSEA AND VOMITING THAT IS NOT CONTROLLED WITH YOUR NAUSEA MEDICATION  *UNUSUAL SHORTNESS OF BREATH  *UNUSUAL BRUISING OR BLEEDING  TENDERNESS IN MOUTH AND THROAT WITH OR WITHOUT PRESENCE OF ULCERS  *URINARY PROBLEMS  *BOWEL PROBLEMS  UNUSUAL RASH Items with * indicate a potential emergency and should be followed up as soon as possible.  Feel free to call the clinic should you have any questions or concerns. The clinic phone number is [phone].  Please show the CHEMO ALERT CARD at check-in to the Emergency Department and triage nurse.  Coronavirus (COVID-19) Are you at risk?  Are you at risk for the Coronavirus (COVID-19)?  To be considered HIGH RISK for Coronavirus (COVID-19), you have to meet the following criteria:  . Traveled to [place], [place], [place], [place] or [place]; or in the [place] to [place], [place], [place], or [place]; and have fever, cough, and shortness of breath within the last 2 weeks of travel OR . Been in close contact with a person diagnosed with COVID-19 within the last 2 weeks and have fever, cough, and shortness of breath . IF YOU DO NOT MEET THESE CRITERIA, YOU ARE CONSIDERED LOW RISK FOR COVID-19.  What to do if you are HIGH RISK for COVID-19?  . If you are having a medical emergency, call 911. . Seek medical care right away. Before you go to a doctor's office, urgent care or emergency department, call ahead and tell them about  your recent travel, contact with someone diagnosed with COVID-19, and your symptoms. You should receive instructions from your physician's office regarding next steps of care.  . When you arrive at healthcare provider, tell the healthcare staff immediately you have returned from visiting [place], [place], [place], [place] or [place]; or traveled in the [place] to [place], [place], [place], or [place]; in the last two weeks or you have been in close contact with a person diagnosed with COVID-19 in the last 2 weeks.   . Tell the health care staff about your symptoms: fever, cough and shortness of breath. . After you have been seen by a medical provider, you will be either: o Tested for (COVID-19) and discharged home on quarantine except to seek medical care if symptoms worsen, and asked to  - Stay home and avoid contact with others until you get your results (4-5 days)  - Avoid travel on public transportation if possible (such as bus, train, or airplane) or o Sent to the Emergency Department by EMS for evaluation, COVID-19 testing, and possible admission depending on your condition and test results.  What to do if you are LOW RISK for COVID-19?  Reduce your risk of any infection by using the same precautions used for avoiding the common cold or flu:  . Wash your hands often with soap and warm water for at least 20 seconds.  If soap and water are not readily available, use an alcohol-based hand sanitizer with at least 60% alcohol.  . If   coughing or sneezing, cover your mouth and nose by coughing or sneezing into the elbow areas of your shirt or coat, into a tissue or into your sleeve (not your hands). . Avoid shaking hands with others and consider head nods or verbal greetings only. . Avoid touching your eyes, nose, or mouth with unwashed hands.  . Avoid close contact with people who are sick. . Avoid places or events with large numbers of people in one location, like concerts or sporting  events. . Carefully consider travel plans you have or are making. . If you are planning any travel outside or inside the [place], visit the CDC's Travelers' Health webpage for the latest health notices. . If you have some symptoms but not all symptoms, continue to monitor at home and seek medical attention if your symptoms worsen. . If you are having a medical emergency, call 911.   [place] / e-Visit: [email or web address]         [facility]: [facility]: [phone]                   [facility]: [phone]

## 2019-03-15 ENCOUNTER — Other Ambulatory Visit: Payer: Self-pay | Admitting: *Deleted

## 2019-03-15 ENCOUNTER — Telehealth: Payer: Self-pay | Admitting: Hematology

## 2019-03-15 MED ORDER — FENTANYL 25 MCG/HR TD PT72: 1.0000 | MEDICATED_PATCH | TRANSDERMAL | 0 refills | Status: DC

## 2019-03-15 NOTE — Telephone Encounter (Signed)
Requested refill of Fentanyl patch - states Dr. Irene Limbo was going to order it yesterday after appointment

## 2019-03-15 NOTE — Telephone Encounter (Signed)
Scheduled appt per 6/9 los. °

## 2019-03-16 ENCOUNTER — Telehealth: Payer: Self-pay | Admitting: *Deleted

## 2019-03-16 NOTE — Telephone Encounter (Signed)
Patient reports no further diarrhea today. Advised per Dr.Kale to notify office for increased temperature, blood or mucus in stools or poor appetite. Patient verbalized understanding - states she is also going to hold the senna s tonight.

## 2019-03-16 NOTE — Telephone Encounter (Signed)
Please disregard previous message regarding only half of Fentanyl given to patient - Patient called to inform that pharmacy able to obtain full prescribed amount.

## 2019-03-16 NOTE — Telephone Encounter (Signed)
Patient called - diarrhea began last evening - continued through this morning. No loss of appetite. Asking if its a side effect of new chemotherapy medication. Reported 5 episodes yesterday of runny/watery stools, with 2 additional this am. She held Miralax today. She will call this afternoon to report if better/worse/stopped. Will hold Senna S this evening. Encouraged fluid replacement throughout day.

## 2019-03-16 NOTE — Telephone Encounter (Signed)
Patient reported that pharmacy only able to fill half of Fentanyl prescription as they only had one box of 5 patches in stock. They will require a new prescription for refill sooner as only half RX given to patient.

## 2019-03-17 ENCOUNTER — Inpatient Hospital Stay: Payer: Medicare HMO

## 2019-03-17 ENCOUNTER — Other Ambulatory Visit: Payer: Self-pay

## 2019-03-17 VITALS — BP 146/88 | HR 70 | Temp 98.7°F | Resp 17

## 2019-03-17 DIAGNOSIS — C9000 Multiple myeloma not having achieved remission: Secondary | ICD-10-CM

## 2019-03-17 DIAGNOSIS — R600 Localized edema: Secondary | ICD-10-CM | POA: Diagnosis not present

## 2019-03-17 DIAGNOSIS — Z79899 Other long term (current) drug therapy: Secondary | ICD-10-CM | POA: Diagnosis not present

## 2019-03-17 DIAGNOSIS — M8580 Other specified disorders of bone density and structure, unspecified site: Secondary | ICD-10-CM | POA: Diagnosis not present

## 2019-03-17 DIAGNOSIS — Z7982 Long term (current) use of aspirin: Secondary | ICD-10-CM | POA: Diagnosis not present

## 2019-03-17 DIAGNOSIS — I10 Essential (primary) hypertension: Secondary | ICD-10-CM | POA: Diagnosis not present

## 2019-03-17 DIAGNOSIS — Z5111 Encounter for antineoplastic chemotherapy: Secondary | ICD-10-CM | POA: Diagnosis not present

## 2019-03-17 DIAGNOSIS — M549 Dorsalgia, unspecified: Secondary | ICD-10-CM | POA: Diagnosis not present

## 2019-03-17 DIAGNOSIS — Z7189 Other specified counseling: Secondary | ICD-10-CM

## 2019-03-17 DIAGNOSIS — Z87891 Personal history of nicotine dependence: Secondary | ICD-10-CM | POA: Diagnosis not present

## 2019-03-17 DIAGNOSIS — K5900 Constipation, unspecified: Secondary | ICD-10-CM | POA: Diagnosis not present

## 2019-03-17 MED ORDER — BORTEZOMIB CHEMO SQ INJECTION 3.5 MG (2.5MG/ML)
1.3000 mg/m2 | Freq: Once | INTRAMUSCULAR | Status: AC
  Administered 2019-03-17: 2 mg via SUBCUTANEOUS
  Filled 2019-03-17: qty 0.8

## 2019-03-17 MED ORDER — PROCHLORPERAZINE MALEATE 10 MG PO TABS
ORAL_TABLET | ORAL | Status: AC
  Filled 2019-03-17: qty 1

## 2019-03-17 MED ORDER — PROCHLORPERAZINE MALEATE 10 MG PO TABS
10.0000 mg | ORAL_TABLET | Freq: Once | ORAL | Status: AC
  Administered 2019-03-17: 10 mg via ORAL

## 2019-03-17 NOTE — Patient Instructions (Signed)
[facility] Discharge Instructions for Patients Receiving Chemotherapy  Today you received the following chemotherapy agents: Bortezomib (Velcade)  To help prevent nausea and vomiting after your treatment, we encourage you to take your nausea medication as directed.    If you develop nausea and vomiting that is not controlled by your nausea medication, call the clinic.   BELOW ARE SYMPTOMS THAT SHOULD BE REPORTED IMMEDIATELY:  *FEVER GREATER THAN 100.5 F  *CHILLS WITH OR WITHOUT FEVER  NAUSEA AND VOMITING THAT IS NOT CONTROLLED WITH YOUR NAUSEA MEDICATION  *UNUSUAL SHORTNESS OF BREATH  *UNUSUAL BRUISING OR BLEEDING  TENDERNESS IN MOUTH AND THROAT WITH OR WITHOUT PRESENCE OF ULCERS  *URINARY PROBLEMS  *BOWEL PROBLEMS  UNUSUAL RASH Items with * indicate a potential emergency and should be followed up as soon as possible.  Feel free to call the clinic should you have any questions or concerns. The clinic phone number is [phone].  Please show the CHEMO ALERT CARD at check-in to the Emergency Department and triage nurse.   

## 2019-03-20 ENCOUNTER — Telehealth: Payer: Self-pay | Admitting: Family Medicine

## 2019-03-20 NOTE — Telephone Encounter (Signed)
Please advise. Patient is unable to have a virtual physical as an option due to insurance being [employer]. Patient also requested to speak to the clinical team on their advice.  Copied from [place] [phone]. Topic: General - Other >> Mar 20, 2019 10:31 AM Celene Kras A wrote: Reason for CRM: Pt called stating she has recently been diagnosed with cancer and is concerned with coming into the office for her physical on 04/11/2019. Pt would like advice from Dr. Yong Channel or his nurse. Please advise.

## 2019-03-20 NOTE — Telephone Encounter (Signed)
Forwarding to Dr. Hunter to advise.  

## 2019-03-20 NOTE — Telephone Encounter (Signed)
I can understand her concern and I think it is reasonable for her to hold off on coming into the office-would she like to just do a virtual visit/follow-up at that time instead and we can reschedule the physical for later date?

## 2019-03-21 ENCOUNTER — Other Ambulatory Visit: Payer: Self-pay

## 2019-03-21 ENCOUNTER — Inpatient Hospital Stay: Payer: Medicare HMO

## 2019-03-21 VITALS — BP 157/90 | HR 68 | Temp 98.7°F | Resp 16 | Wt 100.5 lb

## 2019-03-21 DIAGNOSIS — M549 Dorsalgia, unspecified: Secondary | ICD-10-CM | POA: Diagnosis not present

## 2019-03-21 DIAGNOSIS — M8580 Other specified disorders of bone density and structure, unspecified site: Secondary | ICD-10-CM | POA: Diagnosis not present

## 2019-03-21 DIAGNOSIS — C9000 Multiple myeloma not having achieved remission: Secondary | ICD-10-CM | POA: Diagnosis not present

## 2019-03-21 DIAGNOSIS — Z5111 Encounter for antineoplastic chemotherapy: Secondary | ICD-10-CM | POA: Diagnosis not present

## 2019-03-21 DIAGNOSIS — R600 Localized edema: Secondary | ICD-10-CM | POA: Diagnosis not present

## 2019-03-21 DIAGNOSIS — K5900 Constipation, unspecified: Secondary | ICD-10-CM | POA: Diagnosis not present

## 2019-03-21 DIAGNOSIS — I10 Essential (primary) hypertension: Secondary | ICD-10-CM | POA: Diagnosis not present

## 2019-03-21 DIAGNOSIS — Z7982 Long term (current) use of aspirin: Secondary | ICD-10-CM | POA: Diagnosis not present

## 2019-03-21 DIAGNOSIS — Z87891 Personal history of nicotine dependence: Secondary | ICD-10-CM | POA: Diagnosis not present

## 2019-03-21 DIAGNOSIS — Z79899 Other long term (current) drug therapy: Secondary | ICD-10-CM | POA: Diagnosis not present

## 2019-03-21 DIAGNOSIS — Z7189 Other specified counseling: Secondary | ICD-10-CM

## 2019-03-21 LAB — CBC WITH DIFFERENTIAL/PLATELET
Abs Immature Granulocytes: 0.01 10*3/uL (ref 0.00–0.07)
Basophils Absolute: 0 10*3/uL (ref 0.0–0.1)
Basophils Relative: 1 %
Eosinophils Absolute: 0.1 10*3/uL (ref 0.0–0.5)
Eosinophils Relative: 2 %
HCT: 33.3 % — ABNORMAL LOW (ref 36.0–46.0)
Hemoglobin: 10.7 g/dL — ABNORMAL LOW (ref 12.0–15.0)
Immature Granulocytes: 0 %
Lymphocytes Relative: 12 %
Lymphs Abs: 0.6 10*3/uL — ABNORMAL LOW (ref 0.7–4.0)
MCH: 31.4 pg (ref 26.0–34.0)
MCHC: 32.1 g/dL (ref 30.0–36.0)
MCV: 97.7 fL (ref 80.0–100.0)
Monocytes Absolute: 0.3 10*3/uL (ref 0.1–1.0)
Monocytes Relative: 7 %
Neutro Abs: 3.9 10*3/uL (ref 1.7–7.7)
Neutrophils Relative %: 78 %
Platelets: 204 10*3/uL (ref 150–400)
RBC: 3.41 MIL/uL — ABNORMAL LOW (ref 3.87–5.11)
RDW: 14.6 % (ref 11.5–15.5)
WBC: 5 10*3/uL (ref 4.0–10.5)
nRBC: 0 % (ref 0.0–0.2)

## 2019-03-21 LAB — CMP (CANCER CENTER ONLY)
ALT: 21 U/L (ref 0–44)
AST: 15 U/L (ref 15–41)
Albumin: 3.8 g/dL (ref 3.5–5.0)
Alkaline Phosphatase: 44 U/L (ref 38–126)
Anion gap: 9 (ref 5–15)
BUN: 11 mg/dL (ref 8–23)
CO2: 27 mmol/L (ref 22–32)
Calcium: 8.8 mg/dL — ABNORMAL LOW (ref 8.9–10.3)
Chloride: 101 mmol/L (ref 98–111)
Creatinine: 0.61 mg/dL (ref 0.44–1.00)
GFR, Est AFR Am: >60 mL/min (ref >60)
GFR, Estimated: >60 mL/min (ref >60)
Glucose, Bld: 103 mg/dL — ABNORMAL HIGH (ref 70–99)
Potassium: 4.5 mmol/L (ref 3.5–5.1)
Sodium: 137 mmol/L (ref 135–145)
Total Bilirubin: 0.3 mg/dL (ref 0.3–1.2)
Total Protein: 6.3 g/dL — ABNORMAL LOW (ref 6.5–8.1)

## 2019-03-21 MED ORDER — DEXAMETHASONE 4 MG PO TABS
20.0000 mg | ORAL_TABLET | Freq: Once | ORAL | Status: AC
  Administered 2019-03-21: 20 mg via ORAL

## 2019-03-21 MED ORDER — DENOSUMAB 120 MG/1.7ML ~~LOC~~ SOLN
120.0000 mg | Freq: Once | SUBCUTANEOUS | Status: AC
  Administered 2019-03-21: 120 mg via SUBCUTANEOUS

## 2019-03-21 MED ORDER — BORTEZOMIB CHEMO SQ INJECTION 3.5 MG (2.5MG/ML)
1.3000 mg/m2 | Freq: Once | INTRAMUSCULAR | Status: AC
  Administered 2019-03-21: 2 mg via SUBCUTANEOUS
  Filled 2019-03-21: qty 0.8

## 2019-03-21 MED ORDER — PROCHLORPERAZINE MALEATE 10 MG PO TABS
ORAL_TABLET | ORAL | Status: AC
  Filled 2019-03-21: qty 1

## 2019-03-21 MED ORDER — PROCHLORPERAZINE MALEATE 10 MG PO TABS
10.0000 mg | ORAL_TABLET | Freq: Once | ORAL | Status: AC
  Administered 2019-03-21: 10 mg via ORAL

## 2019-03-21 MED ORDER — DENOSUMAB 120 MG/1.7ML ~~LOC~~ SOLN
SUBCUTANEOUS | Status: AC
  Filled 2019-03-21: qty 1.7

## 2019-03-21 MED ORDER — DEXAMETHASONE 4 MG PO TABS
ORAL_TABLET | ORAL | Status: AC
  Filled 2019-03-21: qty 5

## 2019-03-21 NOTE — Telephone Encounter (Signed)
Spoke with patient. She said that she does not have access to a smart phone or a computer so she will only be able to do the visit by telephone. She will discuss CPE with Dr. Yong Channel when he calls.   Visit info updated.

## 2019-03-21 NOTE — Telephone Encounter (Signed)
Called and left message to have pt call the office.

## 2019-03-21 NOTE — Patient Instructions (Signed)
[facility] Discharge Instructions for Patients Receiving Chemotherapy  Today you received the following chemotherapy agents: Bortezomib (Velcade)  To help prevent nausea and vomiting after your treatment, we encourage you to take your nausea medication as directed.    If you develop nausea and vomiting that is not controlled by your nausea medication, call the clinic.   BELOW ARE SYMPTOMS THAT SHOULD BE REPORTED IMMEDIATELY:  *FEVER GREATER THAN 100.5 F  *CHILLS WITH OR WITHOUT FEVER  NAUSEA AND VOMITING THAT IS NOT CONTROLLED WITH YOUR NAUSEA MEDICATION  *UNUSUAL SHORTNESS OF BREATH  *UNUSUAL BRUISING OR BLEEDING  TENDERNESS IN MOUTH AND THROAT WITH OR WITHOUT PRESENCE OF ULCERS  *URINARY PROBLEMS  *BOWEL PROBLEMS  UNUSUAL RASH Items with * indicate a potential emergency and should be followed up as soon as possible.  Feel free to call the clinic should you have any questions or concerns. The clinic phone number is [phone].  Please show the CHEMO ALERT CARD at check-in to the Emergency Department and triage nurse.  Denosumab injection What is this medicine? DENOSUMAB (den oh sue mab) slows bone breakdown. Prolia is used to treat osteoporosis in women after menopause and in men, and in people who are taking corticosteroids for 6 months or more. Xgeva is used to treat a high calcium level due to cancer and to prevent bone fractures and other bone problems caused by multiple myeloma or cancer bone metastases. Xgeva is also used to treat giant cell tumor of the bone. This medicine may be used for other purposes; ask your health care provider or pharmacist if you have questions. COMMON BRAND NAME(S): Prolia, XGEVA What should I tell my health care provider before I take this medicine? They need to know if you have any of these conditions: -dental disease -having surgery or tooth extraction -infection -kidney disease -low levels of calcium or Vitamin D in  the blood -malnutrition -on hemodialysis -skin conditions or sensitivity -thyroid or parathyroid disease -an unusual reaction to denosumab, other medicines, foods, dyes, or preservatives -pregnant or trying to get pregnant -breast-feeding How should I use this medicine? This medicine is for injection under the skin. It is given by a health care professional in a hospital or clinic setting. A special MedGuide will be given to you before each treatment. Be sure to read this information carefully each time. For Prolia, talk to your pediatrician regarding the use of this medicine in children. Special care may be needed. For Xgeva, talk to your pediatrician regarding the use of this medicine in children. While this drug may be prescribed for children as young as 13 years for selected conditions, precautions do apply. Overdosage: If you think you have taken too much of this medicine contact a poison control center or emergency room at once. NOTE: This medicine is only for you. Do not share this medicine with others. What if I miss a dose? It is important not to miss your dose. Call your doctor or health care professional if you are unable to keep an appointment. What may interact with this medicine? Do not take this medicine with any of the following medications: -other medicines containing denosumab This medicine may also interact with the following medications: -medicines that lower your chance of fighting infection -steroid medicines like prednisone or cortisone This list may not describe all possible interactions. Give your health care provider a list of all the medicines, herbs, non-prescription drugs, or dietary supplements you use. Also tell them if you smoke, drink   alcohol, or use illegal drugs. Some items may interact with your medicine. What should I watch for while using this medicine? Visit your doctor or health care professional for regular checks on your progress. Your doctor or  health care professional may order blood tests and other tests to see how you are doing. Call your doctor or health care professional for advice if you get a fever, chills or sore throat, or other symptoms of a cold or flu. Do not treat yourself. This drug may decrease your body's ability to fight infection. Try to avoid being around people who are sick. You should make sure you get enough calcium and vitamin D while you are taking this medicine, unless your doctor tells you not to. Discuss the foods you eat and the vitamins you take with your health care professional. See your dentist regularly. Brush and floss your teeth as directed. Before you have any dental work done, tell your dentist you are receiving this medicine. Do not become pregnant while taking this medicine or for 5 months after stopping it. Talk with your doctor or health care professional about your birth control options while taking this medicine. Women should inform their doctor if they wish to become pregnant or think they might be pregnant. There is a potential for serious side effects to an unborn child. Talk to your health care professional or pharmacist for more information. What side effects may I notice from receiving this medicine? Side effects that you should report to your doctor or health care professional as soon as possible: -allergic reactions like skin rash, itching or hives, swelling of the face, lips, or tongue -bone pain -breathing problems -dizziness -jaw pain, especially after dental work -redness, blistering, peeling of the skin -signs and symptoms of infection like fever or chills; cough; sore throat; pain or trouble passing urine -signs of low calcium like fast heartbeat, muscle cramps or muscle pain; pain, tingling, numbness in the hands or feet; seizures -unusual bleeding or bruising -unusually weak or tired Side effects that usually do not require medical attention (report to your doctor or health care  professional if they continue or are bothersome): -constipation -diarrhea -headache -joint pain -loss of appetite -muscle pain -runny nose -tiredness -upset stomach This list may not describe all possible side effects. Call your doctor for medical advice about side effects. You may report side effects to FDA at 1-800-FDA-1088. Where should I keep my medicine? This medicine is only given in a clinic, doctor's office, or other health care setting and will not be stored at home. NOTE: This sheet is a summary. It may not cover all possible information. If you have questions about this medicine, talk to your doctor, pharmacist, or health care provider.  2019 [employer] Standard (2018-01-28 16:10:44)  

## 2019-03-22 LAB — MULTIPLE MYELOMA PANEL, SERUM
Albumin SerPl Elph-Mcnc: 3.5 g/dL (ref 2.9–4.4)
Albumin/Glob SerPl: 1.5 (ref 0.7–1.7)
Alpha 1: 0.3 g/dL (ref 0.0–0.4)
Alpha2 Glob SerPl Elph-Mcnc: 0.7 g/dL (ref 0.4–1.0)
B-Globulin SerPl Elph-Mcnc: 1.3 g/dL (ref 0.7–1.3)
Gamma Glob SerPl Elph-Mcnc: 0.2 g/dL — ABNORMAL LOW (ref 0.4–1.8)
Globulin, Total: 2.4 g/dL (ref 2.2–3.9)
IgA: 27 mg/dL — ABNORMAL LOW (ref 64–422)
IgG (Immunoglobin G), Serum: 812 mg/dL (ref 586–1602)
IgM (Immunoglobulin M), Srm: 19 mg/dL — ABNORMAL LOW (ref 26–217)
M Protein SerPl Elph-Mcnc: 0.6 g/dL — ABNORMAL HIGH
Total Protein ELP: 5.9 g/dL — ABNORMAL LOW (ref 6.0–8.5)

## 2019-03-23 ENCOUNTER — Other Ambulatory Visit: Payer: Self-pay | Admitting: *Deleted

## 2019-03-23 MED ORDER — OXYCODONE-ACETAMINOPHEN 5-325 MG PO TABS: 1.0000 | ORAL_TABLET | ORAL | 0 refills | Status: DC | PRN

## 2019-03-23 NOTE — Telephone Encounter (Signed)
Patient called - requested refill of Percocet. Will be out by Monday 6/22

## 2019-03-24 ENCOUNTER — Inpatient Hospital Stay: Payer: Medicare HMO

## 2019-03-24 ENCOUNTER — Other Ambulatory Visit: Payer: Self-pay

## 2019-03-24 VITALS — BP 158/91 | HR 66 | Temp 98.8°F | Resp 18

## 2019-03-24 DIAGNOSIS — M549 Dorsalgia, unspecified: Secondary | ICD-10-CM | POA: Diagnosis not present

## 2019-03-24 DIAGNOSIS — K5900 Constipation, unspecified: Secondary | ICD-10-CM | POA: Diagnosis not present

## 2019-03-24 DIAGNOSIS — Z5111 Encounter for antineoplastic chemotherapy: Secondary | ICD-10-CM | POA: Diagnosis not present

## 2019-03-24 DIAGNOSIS — Z7982 Long term (current) use of aspirin: Secondary | ICD-10-CM | POA: Diagnosis not present

## 2019-03-24 DIAGNOSIS — M8580 Other specified disorders of bone density and structure, unspecified site: Secondary | ICD-10-CM | POA: Diagnosis not present

## 2019-03-24 DIAGNOSIS — Z79899 Other long term (current) drug therapy: Secondary | ICD-10-CM | POA: Diagnosis not present

## 2019-03-24 DIAGNOSIS — Z7189 Other specified counseling: Secondary | ICD-10-CM

## 2019-03-24 DIAGNOSIS — Z87891 Personal history of nicotine dependence: Secondary | ICD-10-CM | POA: Diagnosis not present

## 2019-03-24 DIAGNOSIS — C9000 Multiple myeloma not having achieved remission: Secondary | ICD-10-CM | POA: Diagnosis not present

## 2019-03-24 DIAGNOSIS — I10 Essential (primary) hypertension: Secondary | ICD-10-CM | POA: Diagnosis not present

## 2019-03-24 DIAGNOSIS — R600 Localized edema: Secondary | ICD-10-CM | POA: Diagnosis not present

## 2019-03-24 MED ORDER — PROCHLORPERAZINE MALEATE 10 MG PO TABS
10.0000 mg | ORAL_TABLET | Freq: Once | ORAL | Status: AC
  Administered 2019-03-24: 10 mg via ORAL

## 2019-03-24 MED ORDER — PROCHLORPERAZINE MALEATE 10 MG PO TABS
ORAL_TABLET | ORAL | Status: AC
  Filled 2019-03-24: qty 1

## 2019-03-24 MED ORDER — BORTEZOMIB CHEMO SQ INJECTION 3.5 MG (2.5MG/ML)
1.3000 mg/m2 | Freq: Once | INTRAMUSCULAR | Status: AC
  Administered 2019-03-24: 10:00:00 2 mg via SUBCUTANEOUS
  Filled 2019-03-24: qty 0.8

## 2019-03-28 DIAGNOSIS — R69 Illness, unspecified: Secondary | ICD-10-CM | POA: Diagnosis not present

## 2019-03-28 MED FILL — CYCLOPHOSPHAMIDE 50 MG CAP: 50 | 28 days supply | Qty: 36 | Fill #1

## 2019-03-29 DIAGNOSIS — H401112 Primary open-angle glaucoma, right eye, moderate stage: Secondary | ICD-10-CM | POA: Diagnosis not present

## 2019-03-29 DIAGNOSIS — H401122 Primary open-angle glaucoma, left eye, moderate stage: Secondary | ICD-10-CM | POA: Diagnosis not present

## 2019-04-03 NOTE — Progress Notes (Signed)
HEMATOLOGY/ONCOLOGY CLINIC NOTE  Date of Service: 04/04/2019  Patient Care Team: [name], MD as PCP - General (Family Medicine)  Dr. Ronney Lion as [place] Specialist  [phone]  CHIEF COMPLAINTS/PURPOSE OF CONSULTATION:   Multiple Myeloma- continued management  HISTORY OF PRESENTING ILLNESS:   Megan Olson is a wonderful 78 y.o. female who has been referred to [place] by Dr. 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 She is accompanied today by her husband. The pt reports that she is doing well overall.   The pt notes that she was doing extensive yard work in October 2019, and began to feel back pain a few days after this, which she attributed to muscle pain. She recalls taking a deep breath, and developing sudden acute pain, and presented to care with her PCP's office. She began exercises for her back pain, without relief, then began PT without relief, then was referred to Dr. Paulla Fore in sports medicine in late January 2020. She had an XR which revealed a compression fracture at T11, then subsequently had an MRI, and a bone marrow biopsy. The pt notes that her back pain "seemed to move around." She endorses pain "up the whole left side" of her back.  The pt notes that she is not able to stand up straight due to her back pain, which she feels limits her ability to take a deep breath, and endorses pain exacerbation when she does take a deep breath. The pt needs assistance from sitting to lying from her husband. She is using about 3 Percocet a day.  The pt notes worsened constipation since beginning Percocet, and notes that her most recent laxative order was not covered by her insurance. She is using prune juice and milk of magnesia. She took Senokot S BID without relief.  The pt reports that prior to her recent back pain, she had very few medical concerns. She endorses controlled BP, and has been monitored for DM but  after closely watching her diet her A1C decreased to 5.8. She has glaucoma, and has had surgery in both eyes. Right eye with stent. The pt sees Dr. Edilia Bo at [facility] for her eye care. The pt notes that her vision has been recently "pretty good." The pt notes that she has been advised to limit treatment with steroids for her glaucoma. She denies heart or lung problems. Denies previous back problems. Last DEXA scan 3 years ago, and endorses osteopenia. She notes that she took Fosamax for 3-4 years, and stopped 5-6 years ago. She takes Vitamin D, a multivitamin, and magnesium.  The pt notes that she quit smoking cigarettes when she was 27, started when she was about 20. The pt consumes alcohol rarely, but not since beginning narcotics. 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  Of note prior to the patient's visit today, pt has had an MRI Thoracic Spine completed on 11/27/18 with results revealing Multifocal marrow signal abnormality consistent with metastatic disease or multiple myeloma. The patient has multiple compression fractures most consistent with pathologic injuries. Extensive marrow signal abnormality makes determining age of the fractures difficult but edema is most intense in T3. 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  Most recent lab results (12/01/18) of CBC w/diff is as follows: all values are WNL except for RBC at 3.17, HGB at 10.4, HCT at 33.5, MCV at  105.7. 11/28/18 CMP revealed all values WNL except for Glucose at 105, Total Protein at 10.2, Albumin at 3.1 11/30/18 24HR UPEP revealed all values WNL except for M-spike at 75.1%. 11/28/18 Bega-2 microglobulin slightly elevated at 2.6 11/28/18 SPEP revealed M spike at 4.6g 11/28/18 Immunoglobulins revealed IgG at 6181, IgA at 24, IgM at 16, and IgE at 6.  On review of systems, pt reports constipation, back pain, stable energy levels, ankle swelling,  tenderness at T3, lower back pain, and denies abdominal pains, neck pain, and any other symptoms.   On PMHx the pt reports redundant colon, single hemorrhoid, glaucoma, HTN, osteopenia, Tonsillectomy, right eye stent and multiple eye surgeries. On Social Hx the pt reports rare alcohol use, smoked cigarettes between ages 72 and 66. 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 On Family Hx the pt reports sister died from small cell lung cancer and was a lifelong smoker, brother's daughter died of breast cancer with BRCA1 and BRCA2 mutations. Cousin with female breast cancer.  Interval History:   Megan Olson returns today for management and evaluation of her recently diagnosed multiple myeloma. The patient's last visit with [place] was on 03/14/19. The pt reports that she is doing well overall.  The pt reports that she has developed some new lower, left back. She denies this pain as having a sudden or acute onset and denies significant pain. She notes that she has been more active than she previously was, and notes that she has begun bending over to pick up things up at home. She recently picked up a back brace in conversation with her physical therapist.   The pt notes that she has good energy levels and notes that her pain is well controlled at this time. She denies any new numbness or tingling in her hands or feet.   The pt notes that she "feels so much brighter." She has been tolerating Cyclophosphamide well and is pleased overall.   Lab results today (04/04/19) of CBC w/diff and CMP is as follows: all values are WNL except for RBC at 3.28, HGB at 10.4, HCT at 32.0, Lymphs abs at 400, Glucose at 114, Calcium at 8.4, Total Protein at 6.2, AST at 14.  On review of systems, pt reports some new lower left back pain, more activity, good energy levels, and denies tingling or numbness in hands or feet, and any other symptoms.   MEDICAL HISTORY:  Past Medical History:  Diagnosis Date   Cancer [facility])    multiple  myeloma   Glaucoma    HYPERTENSION 03/11/2007   OSTEOPENIA 03/11/2007    SURGICAL HISTORY: Past Surgical History:  Procedure Laterality Date   BREAST EXCISIONAL BIOPSY Right 2000   BREAST LUMPECTOMY  1990   benign   DILATION AND CURETTAGE OF UTERUS     bleeding at menopause. No uterine cancer   IR RADIOLOGIST EVAL & MGMT  12/13/2018   TONSILLECTOMY     age 65    SOCIAL HISTORY: Social History   Socioeconomic History   Marital status: Married    Spouse name: Not on file   Number of children: Not on file   Years of education: Not on file   Highest education level: Not on file  Occupational History   Not on file  Social Needs   Financial resource strain: Not on file   Food insecurity    Worry: Not on file    Inability: Not on file   Transportation needs    Medical: Not on file  Non-medical: Not on file  Tobacco Use   Smoking status: Former Smoker    Packs/day: 0.50    Years: 7.00    Pack years: 3.50    Types: Cigarettes    Quit date: 01/04/1961    Years since quitting: 58.2   Smokeless tobacco: Never Used  Substance and Sexual Activity   Alcohol use: Yes    Alcohol/week: 1.0 standard drinks    Types: 1 Standard drinks or equivalent per week   Drug use: No   Sexual activity: Not Currently  Lifestyle   Physical activity    Days per week: Not on file    Minutes per session: Not on file   Stress: Not on file  Relationships   Social connections    Talks on phone: Not on file    Gets together: Not on file    Attends religious service: Not on file    Active member of club or organization: Not on file    Attends meetings of clubs or organizations: Not on file    Relationship status: Not on file   Intimate partner violence    Fear of current or ex partner: Not on file    Emotionally abused: Not on file    Physically abused: Not on file    Forced sexual activity: Not on file  Other Topics Concern   Not on file  Social History Narrative     Married. Lives with husband (patient of Dr. Yong Channel). 1 son. No grandkids. 1 granddog.       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, swing dancing, dinner, read       FAMILY HISTORY: Family History  Problem Relation Age of Onset   Heart disease Mother        CHF mother died 22   Arthritis Mother    Glaucoma Mother        sister as well   Alcohol abuse Father    Suicidality Father    Cancer Sister        lung cancer, smoker   Heart disease Sister        aortic valve replacement   Hyperlipidemia Brother    Hypertension Brother    COPD Brother    Arthritis Sister    Hypertension Sister    Glaucoma Sister    Hashimoto's thyroiditis Sister    Hypertension Son    Stroke Maternal Grandmother    Colon cancer Neg Hx    Colon polyps Neg Hx     ALLERGIES:  is allergic to ace inhibitors; benadryl [diphenhydramine]; diamox [acetazolamide]; sulfamethoxazole; lenalidomide; and penicillins.  MEDICATIONS:  Current Outpatient Medications  Medication Sig Dispense Refill   acyclovir (ZOVIRAX) 400 MG tablet Take 1 tablet (400 mg total) by mouth 2 (two) times daily. 60 tablet 3   ALPHAGAN P 0.1 % SOLN Apply 1 drop topically See admin instructions. Apply 2 drops in right eye 3 times a day     amLODipine (NORVASC) 5 MG tablet Take 1 tablet (5 mg total) by mouth daily. 90 tablet 3   aspirin EC 81 MG tablet Take 81 mg by mouth daily.     bimatoprost (LUMIGAN) 0.03 % ophthalmic solution Place 1 drop into the right eye at bedtime.      Cholecalciferol (VITAMIN D3) 1000 UNITS CAPS Take 1,000 Units by mouth 2 (two) times daily.      co-enzyme Q-10 50 MG  capsule Take 50 mg by mouth daily.       cyclophosphamide (CYTOXAN) 50 MG capsule Take 9 capsules (450 mg total) by mouth once a week. Take with food to minimize GI upset. Take early in the day and maintain hydration. 36 capsule 2    dexamethasone (DECADRON) 4 MG tablet 62m (5 tabs) on Day 15 of each cycle of treatment 15 tablet 3   dorzolamide-timolol (COSOPT) 22.3-6.8 MG/ML ophthalmic solution Place 2 drops into both eyes 2 (two) times daily.   11   fentaNYL (DURAGESIC) 25 MCG/HR Place 1 patch onto the skin every 3 (three) days. 10 patch 0   furosemide (LASIX) 20 MG tablet Take 1 tablet (20 mg total) by mouth daily. 30 tablet 1   hydrOXYzine (ATARAX/VISTARIL) 25 MG tablet Take 1 tablet (25 mg total) by mouth 3 (three) times daily as needed. 30 tablet 0   magnesium hydroxide (MILK OF MAGNESIA) 400 MG/5ML suspension Take 15 mLs by mouth daily as needed for mild constipation or moderate constipation. 355 mL 0   Magnesium Oxide 500 MG TABS Take 1 tablet by mouth daily.       Multiple Vitamins-Minerals (MULTIVITAMIN WITH MINERALS) tablet Take 1 tablet by mouth daily.       ondansetron (ZOFRAN) 8 MG tablet Take 1 tablet (8 mg total) by mouth 2 (two) times daily as needed (Nausea or vomiting). 30 tablet 1   oxyCODONE-acetaminophen (PERCOCET) 5-325 MG tablet Take 1-2 tablets by mouth every 4 (four) hours as needed for moderate pain or severe pain. 90 tablet 0   polyethylene glycol (MIRALAX) packet Take 17 g by mouth daily. 30 each 1   potassium chloride SA (K-DUR) 20 MEQ tablet Take 1 tablet (20 mEq total) by mouth daily. (Patient not taking: Reported on 03/14/2019) 30 tablet 1   prochlorperazine (COMPAZINE) 10 MG tablet Take 1 tablet (10 mg total) by mouth every 6 (six) hours as needed (Nausea or vomiting). 30 tablet 1   senna-docusate (SENOKOT-S) 8.6-50 MG tablet Take 2 tablets by mouth at bedtime. 60 tablet 2   vitamin C (ASCORBIC ACID) 500 MG tablet Take 500 mg by mouth daily.     No current facility-administered medications for this visit.     REVIEW OF SYSTEMS:    A 10+ POINT REVIEW OF SYSTEMS WAS OBTAINED including neurology, dermatology, psychiatry, cardiac, respiratory, lymph, extremities, GI, GU,  Musculoskeletal, constitutional, breasts, reproductive, HEENT.  All pertinent positives are noted in the HPI.  All others are negative.   PHYSICAL EXAMINATION: ECOG PERFORMANCE STATUS: 1-2  Vitals:   04/04/19 0941  BP: (!) 168/84  Pulse: 70  Resp: 18  Temp: 99.1 F (37.3 C)  SpO2: 98%   Filed Weights   04/04/19 0941  Weight: 102 lb 6.4 oz (46.4 kg)   Body mass index is 18.43 kg/m.  GENERAL:alert, in no acute distress and comfortable SKIN: no acute rashes, no significant lesions EYES: conjunctiva are pink and non-injected, sclera anicteric OROPHARYNX: MMM, no exudates, no oropharyngeal erythema or ulceration NECK: supple, no JVD LYMPH:  no palpable lymphadenopathy in the cervical, axillary or inguinal regions LUNGS: clear to auscultation b/l with normal respiratory effort HEART: regular rate & rhythm ABDOMEN:  normoactive bowel sounds , non tender, not distended. No palpable hepatosplenomegaly.  Extremity: trace pedal edema PSYCH: alert & oriented x 3 with fluent speech NEURO: no focal motor/sensory deficits   LABORATORY DATA:  I have reviewed the data as listed  . CBC Latest Ref Rng & Units 04/04/2019  03/21/2019 03/14/2019  WBC 4.0 - 10.5 K/uL 4.8 5.0 5.6  Hemoglobin 12.0 - 15.0 g/dL 10.4(L) 10.7(L) 10.6(L)  Hematocrit 36.0 - 46.0 % 32.0(L) 33.3(L) 33.0(L)  Platelets 150 - 400 K/uL 380 204 353    . CMP Latest Ref Rng & Units 04/04/2019 03/21/2019 03/14/2019  Glucose 70 - 99 mg/dL 114(H) 103(H) 118(H)  BUN 8 - 23 mg/dL _0 Creatinine 0.44 - 1.00 mg/dL 0.62 0.61 0.64  Sodium 135 - 145 mmol/L 139 137 134(L)  Potassium 3.5 - 5.1 mmol/L 4.1 4.5 4.2  Chloride 98 - 111 mmol/L 103 101 101  CO2 22 - 32 mmol/L _1 Calcium 8.9 - 10.3 mg/dL 8.4(L) 8.8(L) 8.6(L)  Total Protein 6.5 - 8.1 g/dL 6.2(L) 6.3(L) 6.4(L)  Total Bilirubin 0.3 - 1.2 mg/dL 0.3 0.3 0.3  Alkaline Phos 38 - 126 U/L 39 44 46  AST 15 - 41 U/L 14(L) 15 13(L)  ALT 0 - 44 U/L _2 12/01/18 BM Bx:   12/01/18 Flow Cytometry:       RADIOGRAPHIC STUDIES: I have personally reviewed the radiological images as listed and agreed with the findings in the report. No results found.  ASSESSMENT & PLAN:   78 y.o. female with  1. Multiple Myeloma with IgG Lambda specificity Labs upon initial presentation from 12/01/18 CBC w/diff revealed HGB at 10.4 with MCV of 105.7. 11/28/18 CMP revealed Creatinine normal at 0.68 and Calcium normal at 8.9. 11/28/18 Beta 2 microglobulin at 2.87m (also a reading at 4.7105mon the same day). 11/30/18 24HR UPEP revealed M spike at 75.1%. 11/28/18 SPEP revealed M spike of 4.6g. 11/28/18 Immunoglobulins revealed IgG elevated at 618127m 12/01/18 BM Bx revealed hypercellular bone marrow with 70-80% CD138 immunohistochemistry (44% aspirate) lambda-restricted plasma cells as well as a kappa restricted population of B-cells 11/27/18 MRI Thoracic Spine which revealed Multifocal marrow signal abnormality consistent with metastatic disease or multiple myeloma. The patient has multiple compression fractures most consistent with pathologic injuries. Extensive marrow signal abnormality makes determining age of the fractures difficult but edema is most intense in T3. 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 2. Areas of hypermetabolic disease identified in both lungs suggesting multiple myeloma involvement. 3. 1.9 cm calcified left thyroid nodule is hypermetabolic, but Indeterminate. 4. Colon is diffusely distended with gas and stool. Imaging features would be compatible with clinical constipation. 5.  Aortic  Atherosclerois.  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 She does not currently have elevated lymphocyte counts on peripheral blood draws.  PLAN: -Discussed pt labwork today, 04/04/19; blood counts and chemistries are stable. -The pt has no prohibitive toxicities from continuing C6D1 Velcade, 450m68mclophosphamide, and Dexamethasone at this time. -Last available 03/21/19 MMP revaled M Protein improved to 0.6g, nearly attaining VGPR as initial pre-treatment  value was at 4.3g -Will monitor counts for consideration of increasing dose of Cytoxan -Discussed the options after completing induction treatment as being beginning maintenance therapy vs autologous stem cell transplant consideration and discussion -12/20/18 Vitamin D appropriate at 64.2 -Advised that pt continue avoiding pushing, pulling, bending and lifting to avoid stress on load bearing bones and joints -Continue to focus on in home PT and nutrition -Will order [place] of thyroid in 1-2 months -Discussed that I do not recommend a biopsy of the left lung nodule at this point, but will look for response in this area as treatment continues. Will monitor and discussed that I cannot rule out a primary lung cancer at this point, but that this is likely related to her myeloma. -Following with Dr Edilia Bo for mx of her glaucoma and monitoring in the setting of steroid use. -Recommended moisturizing legs -Continue leg elevation for leg swelling and graded sports compression socks -Continue low dose Lasix as needed and 52mq Potassium replacement -Advised that pt weigh herself while on  Lasix -Continue 878maspirin -Continue 40026mcyclovir BID -Continue 28m42mr Fentanyl patch to smoothen pain control -Continue Oxycodone 1-2 pills every 4-6 hours as needed -Continue two tablets of Senna S at night time and daily Miralax, backing off if diarrhea develops or OTC Fleet enema -Continue Xgeva every 4 weeks, pt denies current dental concerns -Pt has at-home nursing care through [facility] the pt eat very well and continue to stay well hydrate, and stay as active as she can reasonably manage to -Will see the pt back in 3 weeks   continue treatment and followup for C6 and plz schedule C7 of treatment -continue XgevMarchelle Folks visit on C6D1   All of the patients questions were answered with apparent satisfaction. The patient knows to call the clinic with any problems, questions or concerns.  The total time spent in the appt was 25 minutes and more than 50% was on counseling and direct patient cares.    GautSullivan LoneMS AAHIVMS SCH [facility]/Oncology Physician [facility]ice):       [phone] cell):  [phone]):           [phone]/2020 10:13 AM  I, SchuBaldwin [place] acting as a scribe for Dr. GautSullivan Lone.I have reviewed the above documentation for accuracy and completeness, and I agree with the above. .GauBrunetta Genera

## 2019-04-04 ENCOUNTER — Inpatient Hospital Stay: Payer: Medicare HMO

## 2019-04-04 ENCOUNTER — Other Ambulatory Visit: Payer: Self-pay

## 2019-04-04 ENCOUNTER — Inpatient Hospital Stay (HOSPITAL_BASED_OUTPATIENT_CLINIC_OR_DEPARTMENT_OTHER): Payer: Medicare HMO | Admitting: Hematology

## 2019-04-04 ENCOUNTER — Telehealth: Payer: Self-pay | Admitting: Hematology

## 2019-04-04 VITALS — BP 168/84 | HR 70 | Temp 99.1°F | Resp 18 | Ht 62.5 in | Wt 102.4 lb

## 2019-04-04 DIAGNOSIS — Z5111 Encounter for antineoplastic chemotherapy: Secondary | ICD-10-CM

## 2019-04-04 DIAGNOSIS — K5900 Constipation, unspecified: Secondary | ICD-10-CM | POA: Diagnosis not present

## 2019-04-04 DIAGNOSIS — C9000 Multiple myeloma not having achieved remission: Secondary | ICD-10-CM

## 2019-04-04 DIAGNOSIS — I10 Essential (primary) hypertension: Secondary | ICD-10-CM | POA: Diagnosis not present

## 2019-04-04 DIAGNOSIS — Z87891 Personal history of nicotine dependence: Secondary | ICD-10-CM | POA: Diagnosis not present

## 2019-04-04 DIAGNOSIS — Z803 Family history of malignant neoplasm of breast: Secondary | ICD-10-CM

## 2019-04-04 DIAGNOSIS — M549 Dorsalgia, unspecified: Secondary | ICD-10-CM | POA: Diagnosis not present

## 2019-04-04 DIAGNOSIS — Z79899 Other long term (current) drug therapy: Secondary | ICD-10-CM | POA: Diagnosis not present

## 2019-04-04 DIAGNOSIS — Z7189 Other specified counseling: Secondary | ICD-10-CM

## 2019-04-04 DIAGNOSIS — M8580 Other specified disorders of bone density and structure, unspecified site: Secondary | ICD-10-CM | POA: Diagnosis not present

## 2019-04-04 DIAGNOSIS — R600 Localized edema: Secondary | ICD-10-CM | POA: Diagnosis not present

## 2019-04-04 DIAGNOSIS — G893 Neoplasm related pain (acute) (chronic): Secondary | ICD-10-CM

## 2019-04-04 DIAGNOSIS — Z7982 Long term (current) use of aspirin: Secondary | ICD-10-CM | POA: Diagnosis not present

## 2019-04-04 DIAGNOSIS — Z801 Family history of malignant neoplasm of trachea, bronchus and lung: Secondary | ICD-10-CM

## 2019-04-04 LAB — CBC WITH DIFFERENTIAL/PLATELET
Abs Immature Granulocytes: 0.02 10*3/uL (ref 0.00–0.07)
Basophils Absolute: 0.1 10*3/uL (ref 0.0–0.1)
Basophils Relative: 2 %
Eosinophils Absolute: 0.1 10*3/uL (ref 0.0–0.5)
Eosinophils Relative: 1 %
HCT: 32 % — ABNORMAL LOW (ref 36.0–46.0)
Hemoglobin: 10.4 g/dL — ABNORMAL LOW (ref 12.0–15.0)
Immature Granulocytes: 0 %
Lymphocytes Relative: 9 %
Lymphs Abs: 0.4 10*3/uL — ABNORMAL LOW (ref 0.7–4.0)
MCH: 31.7 pg (ref 26.0–34.0)
MCHC: 32.5 g/dL (ref 30.0–36.0)
MCV: 97.6 fL (ref 80.0–100.0)
Monocytes Absolute: 0.5 10*3/uL (ref 0.1–1.0)
Monocytes Relative: 10 %
Neutro Abs: 3.7 10*3/uL (ref 1.7–7.7)
Neutrophils Relative %: 78 %
Platelets: 380 10*3/uL (ref 150–400)
RBC: 3.28 MIL/uL — ABNORMAL LOW (ref 3.87–5.11)
RDW: 15.3 % (ref 11.5–15.5)
WBC: 4.8 10*3/uL (ref 4.0–10.5)
nRBC: 0 % (ref 0.0–0.2)

## 2019-04-04 LAB — CMP (CANCER CENTER ONLY)
ALT: 19 U/L (ref 0–44)
AST: 14 U/L — ABNORMAL LOW (ref 15–41)
Albumin: 3.6 g/dL (ref 3.5–5.0)
Alkaline Phosphatase: 39 U/L (ref 38–126)
Anion gap: 8 (ref 5–15)
BUN: 14 mg/dL (ref 8–23)
CO2: 28 mmol/L (ref 22–32)
Calcium: 8.4 mg/dL — ABNORMAL LOW (ref 8.9–10.3)
Chloride: 103 mmol/L (ref 98–111)
Creatinine: 0.62 mg/dL (ref 0.44–1.00)
GFR, Est AFR Am: >60 mL/min (ref >60)
GFR, Estimated: >60 mL/min (ref >60)
Glucose, Bld: 114 mg/dL — ABNORMAL HIGH (ref 70–99)
Potassium: 4.1 mmol/L (ref 3.5–5.1)
Sodium: 139 mmol/L (ref 135–145)
Total Bilirubin: 0.3 mg/dL (ref 0.3–1.2)
Total Protein: 6.2 g/dL — ABNORMAL LOW (ref 6.5–8.1)

## 2019-04-04 MED ORDER — DEXAMETHASONE 4 MG PO TABS
20.0000 mg | ORAL_TABLET | Freq: Once | ORAL | Status: AC
  Administered 2019-04-04: 20 mg via ORAL

## 2019-04-04 MED ORDER — OXYCODONE-ACETAMINOPHEN 5-325 MG PO TABS: 1.0000 | ORAL_TABLET | ORAL | 0 refills | Status: DC | PRN

## 2019-04-04 MED ORDER — PROCHLORPERAZINE MALEATE 10 MG PO TABS
10.0000 mg | ORAL_TABLET | Freq: Once | ORAL | Status: AC
  Administered 2019-04-04: 10:00:00 10 mg via ORAL

## 2019-04-04 MED ORDER — DEXAMETHASONE 4 MG PO TABS
ORAL_TABLET | ORAL | Status: AC
  Filled 2019-04-04: qty 5

## 2019-04-04 MED ORDER — PROCHLORPERAZINE MALEATE 10 MG PO TABS
ORAL_TABLET | ORAL | Status: AC
  Filled 2019-04-04: qty 1

## 2019-04-04 MED ORDER — BORTEZOMIB CHEMO SQ INJECTION 3.5 MG (2.5MG/ML)
1.3000 mg/m2 | Freq: Once | INTRAMUSCULAR | Status: AC
  Administered 2019-04-04: 11:00:00 2 mg via SUBCUTANEOUS
  Filled 2019-04-04: qty 0.8

## 2019-04-04 NOTE — Telephone Encounter (Signed)
Scheduled appt per 6/30 los.

## 2019-04-04 NOTE — Patient Instructions (Signed)
[facility] Discharge Instructions for Patients Receiving Chemotherapy  Today you received the following chemotherapy agents: Bortezomib (Velcade)  To help prevent nausea and vomiting after your treatment, we encourage you to take your nausea medication as directed.    If you develop nausea and vomiting that is not controlled by your nausea medication, call the clinic.   BELOW ARE SYMPTOMS THAT SHOULD BE REPORTED IMMEDIATELY:  *FEVER GREATER THAN 100.5 F  *CHILLS WITH OR WITHOUT FEVER  NAUSEA AND VOMITING THAT IS NOT CONTROLLED WITH YOUR NAUSEA MEDICATION  *UNUSUAL SHORTNESS OF BREATH  *UNUSUAL BRUISING OR BLEEDING  TENDERNESS IN MOUTH AND THROAT WITH OR WITHOUT PRESENCE OF ULCERS  *URINARY PROBLEMS  *BOWEL PROBLEMS  UNUSUAL RASH Items with * indicate a potential emergency and should be followed up as soon as possible.  Feel free to call the clinic should you have any questions or concerns. The clinic phone number is [phone].  Please show the [facility] at check-in to the Emergency Department and triage nurse.  Denosumab injection What is this medicine? DENOSUMAB (den oh sue mab) slows bone breakdown. Prolia is used to treat osteoporosis in women after menopause and in men, and in people who are taking corticosteroids for 6 months or more. Delton See is used to treat a high calcium level due to cancer and to prevent bone fractures and other bone problems caused by multiple myeloma or cancer bone metastases. Delton See is also used to treat giant cell tumor of the bone. This medicine may be used for other purposes; ask your health care provider or pharmacist if you have questions. COMMON BRAND NAME(S): Prolia, XGEVA What should I tell my health care provider before I take this medicine? They need to know if you have any of these conditions: -dental disease -having surgery or tooth extraction -infection -kidney disease -low levels of calcium or Vitamin D in  the blood -malnutrition -on hemodialysis -skin conditions or sensitivity -thyroid or parathyroid disease -an unusual reaction to denosumab, other medicines, foods, dyes, or preservatives -pregnant or trying to get pregnant -breast-feeding How should I use this medicine? This medicine is for injection under the skin. It is given by a health care professional in a hospital or clinic setting. A special MedGuide will be given to you before each treatment. Be sure to read this information carefully each time. For Prolia, talk to your pediatrician regarding the use of this medicine in children. Special care may be needed. For Delton See, talk to your pediatrician regarding the use of this medicine in children. While this drug may be prescribed for children as young as 13 years for selected conditions, precautions do apply. Overdosage: If you think you have taken too much of this medicine contact a poison control center or emergency room at once. NOTE: This medicine is only for you. Do not share this medicine with others. What if I miss a dose? It is important not to miss your dose. Call your doctor or health care professional if you are unable to keep an appointment. What may interact with this medicine? Do not take this medicine with any of the following medications: -other medicines containing denosumab This medicine may also interact with the following medications: -medicines that lower your chance of fighting infection -steroid medicines like prednisone or cortisone This list may not describe all possible interactions. Give your health care provider a list of all the medicines, herbs, non-prescription drugs, or dietary supplements you use. Also tell them if you smoke, drink  alcohol, or use illegal drugs. Some items may interact with your medicine. What should I watch for while using this medicine? Visit your doctor or health care professional for regular checks on your progress. Your doctor or  health care professional may order blood tests and other tests to see how you are doing. Call your doctor or health care professional for advice if you get a fever, chills or sore throat, or other symptoms of a cold or flu. Do not treat yourself. This drug may decrease your body's ability to fight infection. Try to avoid being around people who are sick. You should make sure you get enough calcium and vitamin D while you are taking this medicine, unless your doctor tells you not to. Discuss the foods you eat and the vitamins you take with your health care professional. See your dentist regularly. Brush and floss your teeth as directed. Before you have any dental work done, tell your dentist you are receiving this medicine. Do not become pregnant while taking this medicine or for 5 months after stopping it. Talk with your doctor or health care professional about your birth control options while taking this medicine. Women should inform their doctor if they wish to become pregnant or think they might be pregnant. There is a potential for serious side effects to an unborn child. Talk to your health care professional or pharmacist for more information. What side effects may I notice from receiving this medicine? Side effects that you should report to your doctor or health care professional as soon as possible: -allergic reactions like skin rash, itching or hives, swelling of the face, lips, or tongue -bone pain -breathing problems -dizziness -jaw pain, especially after dental work -redness, blistering, peeling of the skin -signs and symptoms of infection like fever or chills; cough; sore throat; pain or trouble passing urine -signs of low calcium like fast heartbeat, muscle cramps or muscle pain; pain, tingling, numbness in the hands or feet; seizures -unusual bleeding or bruising -unusually weak or tired Side effects that usually do not require medical attention (report to your doctor or health care  professional if they continue or are bothersome): -constipation -diarrhea -headache -joint pain -loss of appetite -muscle pain -runny nose -tiredness -upset stomach This list may not describe all possible side effects. Call your doctor for medical advice about side effects. You may report side effects to FDA at 1-800-FDA-1088. Where should I keep my medicine? This medicine is only given in a clinic, doctor's office, or other health care setting and will not be stored at home. NOTE: This sheet is a summary. It may not cover all possible information. If you have questions about this medicine, talk to your doctor, pharmacist, or health care provider.  2019 Elsevier/Gold Standard (2018-01-28 16:10:44)

## 2019-04-06 ENCOUNTER — Inpatient Hospital Stay: Payer: Medicare HMO | Attending: Hematology

## 2019-04-06 ENCOUNTER — Other Ambulatory Visit: Payer: Self-pay

## 2019-04-06 VITALS — BP 147/82 | HR 72 | Temp 99.0°F | Resp 18

## 2019-04-06 DIAGNOSIS — K5900 Constipation, unspecified: Secondary | ICD-10-CM | POA: Diagnosis not present

## 2019-04-06 DIAGNOSIS — Z87891 Personal history of nicotine dependence: Secondary | ICD-10-CM | POA: Diagnosis not present

## 2019-04-06 DIAGNOSIS — Z801 Family history of malignant neoplasm of trachea, bronchus and lung: Secondary | ICD-10-CM | POA: Insufficient documentation

## 2019-04-06 DIAGNOSIS — H409 Unspecified glaucoma: Secondary | ICD-10-CM | POA: Diagnosis not present

## 2019-04-06 DIAGNOSIS — M549 Dorsalgia, unspecified: Secondary | ICD-10-CM | POA: Insufficient documentation

## 2019-04-06 DIAGNOSIS — Z803 Family history of malignant neoplasm of breast: Secondary | ICD-10-CM | POA: Insufficient documentation

## 2019-04-06 DIAGNOSIS — Z5111 Encounter for antineoplastic chemotherapy: Secondary | ICD-10-CM | POA: Insufficient documentation

## 2019-04-06 DIAGNOSIS — R197 Diarrhea, unspecified: Secondary | ICD-10-CM | POA: Insufficient documentation

## 2019-04-06 DIAGNOSIS — Z7982 Long term (current) use of aspirin: Secondary | ICD-10-CM | POA: Diagnosis not present

## 2019-04-06 DIAGNOSIS — I10 Essential (primary) hypertension: Secondary | ICD-10-CM | POA: Insufficient documentation

## 2019-04-06 DIAGNOSIS — R21 Rash and other nonspecific skin eruption: Secondary | ICD-10-CM | POA: Insufficient documentation

## 2019-04-06 DIAGNOSIS — M8580 Other specified disorders of bone density and structure, unspecified site: Secondary | ICD-10-CM | POA: Insufficient documentation

## 2019-04-06 DIAGNOSIS — M545 Low back pain: Secondary | ICD-10-CM | POA: Diagnosis not present

## 2019-04-06 DIAGNOSIS — M25473 Effusion, unspecified ankle: Secondary | ICD-10-CM | POA: Insufficient documentation

## 2019-04-06 DIAGNOSIS — C9000 Multiple myeloma not having achieved remission: Secondary | ICD-10-CM | POA: Diagnosis not present

## 2019-04-06 DIAGNOSIS — D72820 Lymphocytosis (symptomatic): Secondary | ICD-10-CM | POA: Insufficient documentation

## 2019-04-06 DIAGNOSIS — Z79899 Other long term (current) drug therapy: Secondary | ICD-10-CM | POA: Diagnosis not present

## 2019-04-06 DIAGNOSIS — M7989 Other specified soft tissue disorders: Secondary | ICD-10-CM | POA: Diagnosis not present

## 2019-04-06 DIAGNOSIS — Z7189 Other specified counseling: Secondary | ICD-10-CM

## 2019-04-06 MED ORDER — PROCHLORPERAZINE MALEATE 10 MG PO TABS
ORAL_TABLET | ORAL | Status: AC
  Filled 2019-04-06: qty 1

## 2019-04-06 MED ORDER — BORTEZOMIB CHEMO SQ INJECTION 3.5 MG (2.5MG/ML)
1.3000 mg/m2 | Freq: Once | INTRAMUSCULAR | Status: AC
  Administered 2019-04-06: 2 mg via SUBCUTANEOUS
  Filled 2019-04-06: qty 0.8

## 2019-04-06 MED ORDER — PROCHLORPERAZINE MALEATE 10 MG PO TABS
10.0000 mg | ORAL_TABLET | Freq: Once | ORAL | Status: AC
  Administered 2019-04-06: 10 mg via ORAL

## 2019-04-06 NOTE — Patient Instructions (Signed)
[facility] Discharge Instructions for Patients Receiving Chemotherapy  Today you received the following chemotherapy agents Velcade To help prevent nausea and vomiting after your treatment, we encourage you to take your nausea medication as prescribed.   If you develop nausea and vomiting that is not controlled by your nausea medication, call the clinic.   BELOW ARE SYMPTOMS THAT SHOULD BE REPORTED IMMEDIATELY:  *FEVER GREATER THAN 100.5 F  *CHILLS WITH OR WITHOUT FEVER  NAUSEA AND VOMITING THAT IS NOT CONTROLLED WITH YOUR NAUSEA MEDICATION  *UNUSUAL SHORTNESS OF BREATH  *UNUSUAL BRUISING OR BLEEDING  TENDERNESS IN MOUTH AND THROAT WITH OR WITHOUT PRESENCE OF ULCERS  *URINARY PROBLEMS  *BOWEL PROBLEMS  UNUSUAL RASH Items with * indicate a potential emergency and should be followed up as soon as possible.  Feel free to call the clinic should you have any questions or concerns. The clinic phone number is [phone].  Please show the CHEMO ALERT CARD at check-in to the Emergency Department and triage nurse.   

## 2019-04-10 ENCOUNTER — Telehealth: Payer: Self-pay | Admitting: *Deleted

## 2019-04-10 ENCOUNTER — Other Ambulatory Visit: Payer: Self-pay | Admitting: *Deleted

## 2019-04-10 DIAGNOSIS — C9000 Multiple myeloma not having achieved remission: Secondary | ICD-10-CM

## 2019-04-10 DIAGNOSIS — Z7189 Other specified counseling: Secondary | ICD-10-CM

## 2019-04-10 NOTE — Telephone Encounter (Signed)
Patient called - requested refill of Acyclovir

## 2019-04-10 NOTE — Telephone Encounter (Signed)
Patient states her feet have purplish areas on them. Appear like bruises. Not painful. She had them at last appt, but forgot to tell MD and wanted Dr. Irene Limbo to know. MD informed.

## 2019-04-10 NOTE — Telephone Encounter (Signed)
Per Dr. Irene Limbo: Please inform patient that skin changes likely due to steroids, he will monitor them. She had asked about haircut from stylist at her home (outdoors) and he asks that she be informed to hold off on hair cut at this time.

## 2019-04-11 ENCOUNTER — Inpatient Hospital Stay: Payer: Medicare HMO

## 2019-04-11 ENCOUNTER — Other Ambulatory Visit: Payer: Self-pay

## 2019-04-11 ENCOUNTER — Ambulatory Visit (INDEPENDENT_AMBULATORY_CARE_PROVIDER_SITE_OTHER): Payer: Medicare HMO | Admitting: Family Medicine

## 2019-04-11 ENCOUNTER — Encounter: Payer: Self-pay | Admitting: Family Medicine

## 2019-04-11 VITALS — BP 153/92 | HR 68 | Temp 98.9°F | Resp 18

## 2019-04-11 VITALS — BP 135/82 | HR 67 | Wt 100.6 lb

## 2019-04-11 DIAGNOSIS — I10 Essential (primary) hypertension: Secondary | ICD-10-CM

## 2019-04-11 DIAGNOSIS — K5900 Constipation, unspecified: Secondary | ICD-10-CM | POA: Diagnosis not present

## 2019-04-11 DIAGNOSIS — C9000 Multiple myeloma not having achieved remission: Secondary | ICD-10-CM | POA: Diagnosis not present

## 2019-04-11 DIAGNOSIS — M7989 Other specified soft tissue disorders: Secondary | ICD-10-CM | POA: Diagnosis not present

## 2019-04-11 DIAGNOSIS — D72820 Lymphocytosis (symptomatic): Secondary | ICD-10-CM | POA: Diagnosis not present

## 2019-04-11 DIAGNOSIS — R21 Rash and other nonspecific skin eruption: Secondary | ICD-10-CM | POA: Diagnosis not present

## 2019-04-11 DIAGNOSIS — E441 Mild protein-calorie malnutrition: Secondary | ICD-10-CM | POA: Diagnosis not present

## 2019-04-11 DIAGNOSIS — G893 Neoplasm related pain (acute) (chronic): Secondary | ICD-10-CM

## 2019-04-11 DIAGNOSIS — Z7189 Other specified counseling: Secondary | ICD-10-CM

## 2019-04-11 DIAGNOSIS — M8580 Other specified disorders of bone density and structure, unspecified site: Secondary | ICD-10-CM | POA: Diagnosis not present

## 2019-04-11 DIAGNOSIS — M25473 Effusion, unspecified ankle: Secondary | ICD-10-CM | POA: Diagnosis not present

## 2019-04-11 DIAGNOSIS — R197 Diarrhea, unspecified: Secondary | ICD-10-CM | POA: Diagnosis not present

## 2019-04-11 DIAGNOSIS — M549 Dorsalgia, unspecified: Secondary | ICD-10-CM | POA: Diagnosis not present

## 2019-04-11 DIAGNOSIS — Z5111 Encounter for antineoplastic chemotherapy: Secondary | ICD-10-CM | POA: Diagnosis not present

## 2019-04-11 LAB — CMP (CANCER CENTER ONLY)
ALT: 24 U/L (ref 0–44)
AST: 20 U/L (ref 15–41)
Albumin: 3.8 g/dL (ref 3.5–5.0)
Alkaline Phosphatase: 38 U/L (ref 38–126)
Anion gap: 8 (ref 5–15)
BUN: 15 mg/dL (ref 8–23)
CO2: 28 mmol/L (ref 22–32)
Calcium: 8.8 mg/dL — ABNORMAL LOW (ref 8.9–10.3)
Chloride: 100 mmol/L (ref 98–111)
Creatinine: 0.64 mg/dL (ref 0.44–1.00)
GFR, Est AFR Am: >60 mL/min (ref >60)
GFR, Estimated: >60 mL/min (ref >60)
Glucose, Bld: 100 mg/dL — ABNORMAL HIGH (ref 70–99)
Potassium: 4.3 mmol/L (ref 3.5–5.1)
Sodium: 136 mmol/L (ref 135–145)
Total Bilirubin: 0.3 mg/dL (ref 0.3–1.2)
Total Protein: 6.2 g/dL — ABNORMAL LOW (ref 6.5–8.1)

## 2019-04-11 LAB — CBC WITH DIFFERENTIAL/PLATELET
Abs Immature Granulocytes: 0.01 10*3/uL (ref 0.00–0.07)
Basophils Absolute: 0.1 10*3/uL (ref 0.0–0.1)
Basophils Relative: 2 %
Eosinophils Absolute: 0.1 10*3/uL (ref 0.0–0.5)
Eosinophils Relative: 2 %
HCT: 31.8 % — ABNORMAL LOW (ref 36.0–46.0)
Hemoglobin: 10.1 g/dL — ABNORMAL LOW (ref 12.0–15.0)
Immature Granulocytes: 0 %
Lymphocytes Relative: 16 %
Lymphs Abs: 0.5 10*3/uL — ABNORMAL LOW (ref 0.7–4.0)
MCH: 31.7 pg (ref 26.0–34.0)
MCHC: 31.8 g/dL (ref 30.0–36.0)
MCV: 99.7 fL (ref 80.0–100.0)
Monocytes Absolute: 0.4 10*3/uL (ref 0.1–1.0)
Monocytes Relative: 13 %
Neutro Abs: 2 10*3/uL (ref 1.7–7.7)
Neutrophils Relative %: 67 %
Platelets: 217 10*3/uL (ref 150–400)
RBC: 3.19 MIL/uL — ABNORMAL LOW (ref 3.87–5.11)
RDW: 15.4 % (ref 11.5–15.5)
WBC: 2.9 10*3/uL — ABNORMAL LOW (ref 4.0–10.5)
nRBC: 0 % (ref 0.0–0.2)

## 2019-04-11 MED ORDER — DEXAMETHASONE 4 MG PO TABS
ORAL_TABLET | ORAL | Status: AC
  Filled 2019-04-11: qty 5

## 2019-04-11 MED ORDER — PROCHLORPERAZINE MALEATE 10 MG PO TABS
ORAL_TABLET | ORAL | Status: AC
  Filled 2019-04-11: qty 1

## 2019-04-11 MED ORDER — DEXAMETHASONE 4 MG PO TABS
20.0000 mg | ORAL_TABLET | Freq: Once | ORAL | Status: AC
  Administered 2019-04-11: 20 mg via ORAL

## 2019-04-11 MED ORDER — AMLODIPINE BESYLATE 5 MG PO TABS: 5.0000 mg | ORAL_TABLET | Freq: Every day | ORAL | 3 refills | Status: DC

## 2019-04-11 MED ORDER — ACYCLOVIR 400 MG PO TABS: 400.0000 mg | ORAL_TABLET | Freq: Two times a day (BID) | ORAL | 3 refills | Status: DC

## 2019-04-11 MED ORDER — PROCHLORPERAZINE MALEATE 10 MG PO TABS
10.0000 mg | ORAL_TABLET | Freq: Once | ORAL | Status: AC
  Administered 2019-04-11: 10 mg via ORAL

## 2019-04-11 MED ORDER — BORTEZOMIB CHEMO SQ INJECTION 3.5 MG (2.5MG/ML)
1.3000 mg/m2 | Freq: Once | INTRAMUSCULAR | Status: AC
  Administered 2019-04-11: 2 mg via SUBCUTANEOUS
  Filled 2019-04-11: qty 0.8

## 2019-04-11 NOTE — Progress Notes (Signed)
Phone [phone]   Subjective:  Virtual visit via phonenote Chief Complaint  Patient presents with  . Follow-up  . Hypertension  . Hyperglycemia   This visit type was conducted due to national recommendations for restrictions regarding the COVID-19 Pandemic (e.g. social distancing).  This format is felt to be most appropriate for this patient at this time balancing risks to patient and risks to population by having him in for in person visit.  All issues noted in this document were discussed and addressed.  No physical exam was performed (except for noted visual exam or audio findings with Telehealth visits).  The patient has consented to conduct a Telehealth visit and understands insurance will be billed.   Our team/I connected with [name] at  8:40 AM EDT by phone (patient did not have equipment for webex) and verified that I am speaking with the correct person using two identifiers.  Location patient: [place] Location provider: [facility], office Persons participating in the virtual visit:  patient  Time on phone: 14 minutes Counseling provided about  Stress of multiple myeloma, covid 19 environment, blood pressure control  Our team/I discussed the limitations of evaluation and management by telemedicine and the availability of in person appointments. In light of current covid-19 pandemic, patient also understands that we are trying to protect them by minimizing in office contact if at all possible.  The patient expressed consent for telemedicine visit and agreed to proceed. Patient understands insurance will be billed.   ROS- no fever/chills/cough/sore throat.    Past Medical History-  Patient Active Problem List   Diagnosis Date Noted  . Multiple myeloma not having achieved remission ([place]) 12/12/2018    Priority: High  . Pain in thoracic spine 11/03/2018    Priority: Medium  . Hyperglycemia 03/29/2015    Priority: Medium  . Hyperlipidemia 10/18/2014    Priority:  Medium  . Essential hypertension 03/11/2007    Priority: Medium  . Counseling regarding advance care planning and goals of care 12/12/2018    Priority: Low  . History of adenomatous polyp of colon 07/23/2015    Priority: Low  . Raynaud phenomenon 10/19/2014    Priority: Low  . Syncope 10/19/2014    Priority: Low  . Glaucoma 01/05/2011    Priority: Low  . Osteopenia 03/11/2007    Priority: Low    Medications- reviewed and updated Current Outpatient Medications  Medication Sig Dispense Refill  . acyclovir (ZOVIRAX) 400 MG tablet Take 1 tablet (400 mg total) by mouth 2 (two) times daily. 60 tablet 3  . ALPHAGAN P 0.1 % SOLN Apply 1 drop topically See admin instructions. Apply 2 drops in right eye 3 times a day    . amLODipine (NORVASC) 5 MG tablet Take 1 tablet (5 mg total) by mouth daily. 90 tablet 3  . aspirin EC 81 MG tablet Take 81 mg by mouth daily.    . bimatoprost (LUMIGAN) 0.03 % ophthalmic solution Place 1 drop into the right eye at bedtime.     . Cholecalciferol (VITAMIN D3) 1000 UNITS CAPS Take 1,000 Units by mouth 2 (two) times daily.     Marland Kitchen co-enzyme Q-10 50 MG capsule Take 50 mg by mouth daily.      . cyclophosphamide (CYTOXAN) 50 MG capsule Take 9 capsules (450 mg total) by mouth once a week. Take with food to minimize GI upset. Take early in the day and maintain hydration. 36 capsule 2  . dexamethasone (DECADRON) 4 MG tablet 29m (5  tabs) on Day 15 of each cycle of treatment 15 tablet 3  . dorzolamide-timolol (COSOPT) 22.3-6.8 MG/ML ophthalmic solution Place 2 drops into both eyes 2 (two) times daily.   11  . fentaNYL (DURAGESIC) 25 MCG/HR Place 1 patch onto the skin every 3 (three) days. 10 patch 0  . furosemide (LASIX) 20 MG tablet Take 1 tablet (20 mg total) by mouth daily. 30 tablet 1  . hydrOXYzine (ATARAX/VISTARIL) 25 MG tablet Take 1 tablet (25 mg total) by mouth 3 (three) times daily as needed. 30 tablet 0  . magnesium hydroxide (MILK OF MAGNESIA) 400 MG/5ML  suspension Take 15 mLs by mouth daily as needed for mild constipation or moderate constipation. 355 mL 0  . Magnesium Oxide 500 MG TABS Take 1 tablet by mouth daily.      . Multiple Vitamins-Minerals (MULTIVITAMIN WITH MINERALS) tablet Take 1 tablet by mouth daily.      . ondansetron (ZOFRAN) 8 MG tablet Take 1 tablet (8 mg total) by mouth 2 (two) times daily as needed (Nausea or vomiting). 30 tablet 1  . oxyCODONE-acetaminophen (PERCOCET) 5-325 MG tablet Take 1-2 tablets by mouth every 4 (four) hours as needed for moderate pain or severe pain. 90 tablet 0  . polyethylene glycol (MIRALAX) packet Take 17 g by mouth daily. 30 each 1  . potassium chloride SA (K-DUR) 20 MEQ tablet Take 1 tablet (20 mEq total) by mouth daily. (Patient not taking: Reported on 03/14/2019) 30 tablet 1  . prochlorperazine (COMPAZINE) 10 MG tablet Take 1 tablet (10 mg total) by mouth every 6 (six) hours as needed (Nausea or vomiting). 30 tablet 1  . senna-docusate (SENOKOT-S) 8.6-50 MG tablet Take 2 tablets by mouth at bedtime. 60 tablet 2  . vitamin C (ASCORBIC ACID) 500 MG tablet Take 500 mg by mouth daily.     No current facility-administered medications for this visit.      Objective:  BP 135/82   Pulse 67   Wt 100 lb 9.6 oz (45.6 kg)   BMI 18.11 kg/m  self reported vitals  Nonlabored voice, normal speech     Assessment and Plan   # Multiple myeloma/hyperglycemia/malnourished- continues to feel like she is doing well all things considered- continues her regular infusions/treatments. She reports Dr. Irene Limbo told her tumor shrinkage was at 80% Malnutrition- she is trying to drink a protein supplement husband is taking daily. She wants to be cautious due to prior prediabetes- discussed with her right now keeping weight up to allow her to endure treatments is helpful Lab Results  Component Value Date   HGBA1C 5.6 10/10/2018  - patient feels very bent over- has tried back brace and that seems to help with walking around  the house unaided (no longer using walker/cane)  - fasting CBG was at 114 previously  #hypertension S: controlled on Amlodipine 5 mg. Off Lasix 20 mg for a month. No missed doses, no side effects. Denies HA, dizziness, visual changes, CP, SOB. Watching diet, limits adding additional salt to food.  BP Readings from Last 3 Encounters:  04/11/19 135/82. Sunday 123/75. Monday 124/82.   04/06/19 (!) 147/82 usually in pain at treatments  04/04/19 (!) 168/84  A/P: Stable. Continue current medications.   6 month follow up would be reasonable- likely virtual. We will see how things go with treatment before scheduling Lab/Order associations:   ICD-10-CM   1. Multiple myeloma not having achieved remission ([place])  C90.00   2. Essential hypertension  I10   3.  Mild protein-calorie malnutrition ([place])  E44.1    Return precautions advised.  Garret Reddish, MD

## 2019-04-11 NOTE — Patient Instructions (Signed)
[facility] Discharge Instructions for Patients Receiving Chemotherapy  Today you received the following chemotherapy agents Velcade To help prevent nausea and vomiting after your treatment, we encourage you to take your nausea medication as prescribed.   If you develop nausea and vomiting that is not controlled by your nausea medication, call the clinic.   BELOW ARE SYMPTOMS THAT SHOULD BE REPORTED IMMEDIATELY:  *FEVER GREATER THAN 100.5 F  *CHILLS WITH OR WITHOUT FEVER  NAUSEA AND VOMITING THAT IS NOT CONTROLLED WITH YOUR NAUSEA MEDICATION  *UNUSUAL SHORTNESS OF BREATH  *UNUSUAL BRUISING OR BLEEDING  TENDERNESS IN MOUTH AND THROAT WITH OR WITHOUT PRESENCE OF ULCERS  *URINARY PROBLEMS  *BOWEL PROBLEMS  UNUSUAL RASH Items with * indicate a potential emergency and should be followed up as soon as possible.  Feel free to call the clinic should you have any questions or concerns. The clinic phone number is [phone].  Please show the CHEMO ALERT CARD at check-in to the Emergency Department and triage nurse.   

## 2019-04-11 NOTE — Patient Instructions (Signed)
There are no preventive care reminders to display for this patient.  Depression screen [facility] 2/9 09/06/2018 04/04/2018 04/02/2017  Decreased Interest 0 0 0  Down, Depressed, Hopeless 0 0 0  PHQ - 2 Score 0 0 0  Altered sleeping 0 - -  Tired, decreased energy 0 - -  Change in appetite 0 - -  Feeling bad or failure about yourself  0 - -  Trouble concentrating 0 - -  Moving slowly or fidgety/restless 0 - -  Suicidal thoughts 0 - -  PHQ-9 Score 0 - -  Difficult doing work/chores Not difficult at all - -

## 2019-04-11 NOTE — Assessment & Plan Note (Signed)
S: controlled on Amlodipine 5 mg. Off Lasix 20 mg for a month. No missed doses, no side effects. Denies HA, dizziness, visual changes, CP, SOB. Watching diet, limits adding additional salt to food.  BP Readings from Last 3 Encounters:  04/11/19 135/82. Sunday 123/75. Monday 124/82.   04/06/19 (!) 147/82 usually in pain at treatments  04/04/19 (!) 168/84  A/P: Stable. Continue current medications.

## 2019-04-14 ENCOUNTER — Inpatient Hospital Stay: Payer: Medicare HMO

## 2019-04-14 ENCOUNTER — Telehealth: Payer: Self-pay | Admitting: Hematology

## 2019-04-14 ENCOUNTER — Other Ambulatory Visit: Payer: Self-pay

## 2019-04-14 ENCOUNTER — Other Ambulatory Visit: Payer: Self-pay | Admitting: *Deleted

## 2019-04-14 VITALS — BP 146/79 | HR 64 | Temp 98.7°F | Resp 17

## 2019-04-14 DIAGNOSIS — M8580 Other specified disorders of bone density and structure, unspecified site: Secondary | ICD-10-CM | POA: Diagnosis not present

## 2019-04-14 DIAGNOSIS — C9000 Multiple myeloma not having achieved remission: Secondary | ICD-10-CM

## 2019-04-14 DIAGNOSIS — K5900 Constipation, unspecified: Secondary | ICD-10-CM | POA: Diagnosis not present

## 2019-04-14 DIAGNOSIS — D72820 Lymphocytosis (symptomatic): Secondary | ICD-10-CM | POA: Diagnosis not present

## 2019-04-14 DIAGNOSIS — M7989 Other specified soft tissue disorders: Secondary | ICD-10-CM | POA: Diagnosis not present

## 2019-04-14 DIAGNOSIS — R197 Diarrhea, unspecified: Secondary | ICD-10-CM | POA: Diagnosis not present

## 2019-04-14 DIAGNOSIS — M549 Dorsalgia, unspecified: Secondary | ICD-10-CM | POA: Diagnosis not present

## 2019-04-14 DIAGNOSIS — M25473 Effusion, unspecified ankle: Secondary | ICD-10-CM | POA: Diagnosis not present

## 2019-04-14 DIAGNOSIS — Z7189 Other specified counseling: Secondary | ICD-10-CM

## 2019-04-14 DIAGNOSIS — Z5111 Encounter for antineoplastic chemotherapy: Secondary | ICD-10-CM | POA: Diagnosis not present

## 2019-04-14 DIAGNOSIS — R21 Rash and other nonspecific skin eruption: Secondary | ICD-10-CM | POA: Diagnosis not present

## 2019-04-14 MED ORDER — DENOSUMAB 120 MG/1.7ML ~~LOC~~ SOLN
SUBCUTANEOUS | Status: AC
  Filled 2019-04-14: qty 1.7

## 2019-04-14 MED ORDER — PROCHLORPERAZINE MALEATE 10 MG PO TABS
ORAL_TABLET | ORAL | Status: AC
  Filled 2019-04-14: qty 1

## 2019-04-14 MED ORDER — BORTEZOMIB CHEMO SQ INJECTION 3.5 MG (2.5MG/ML)
1.3000 mg/m2 | Freq: Once | INTRAMUSCULAR | Status: AC
  Administered 2019-04-14: 2 mg via SUBCUTANEOUS
  Filled 2019-04-14: qty 0.8

## 2019-04-14 MED ORDER — PROCHLORPERAZINE MALEATE 10 MG PO TABS
10.0000 mg | ORAL_TABLET | Freq: Once | ORAL | Status: AC
  Administered 2019-04-14: 10 mg via ORAL

## 2019-04-14 NOTE — Telephone Encounter (Signed)
Scheduled appt per 7/10 sch message - pt aware

## 2019-04-14 NOTE — Telephone Encounter (Signed)
Requested refill for Fentanyl 

## 2019-04-14 NOTE — Patient Instructions (Signed)
[facility] Discharge Instructions for Patients Receiving Chemotherapy  Today you received the following chemotherapy agents Velcade To help prevent nausea and vomiting after your treatment, we encourage you to take your nausea medication as prescribed.   If you develop nausea and vomiting that is not controlled by your nausea medication, call the clinic.   BELOW ARE SYMPTOMS THAT SHOULD BE REPORTED IMMEDIATELY:  *FEVER GREATER THAN 100.5 F  *CHILLS WITH OR WITHOUT FEVER  NAUSEA AND VOMITING THAT IS NOT CONTROLLED WITH YOUR NAUSEA MEDICATION  *UNUSUAL SHORTNESS OF BREATH  *UNUSUAL BRUISING OR BLEEDING  TENDERNESS IN MOUTH AND THROAT WITH OR WITHOUT PRESENCE OF ULCERS  *URINARY PROBLEMS  *BOWEL PROBLEMS  UNUSUAL RASH Items with * indicate a potential emergency and should be followed up as soon as possible.  Feel free to call the clinic should you have any questions or concerns. The clinic phone number is [phone].  Please show the CHEMO ALERT CARD at check-in to the Emergency Department and triage nurse.   

## 2019-04-15 MED ORDER — FENTANYL 25 MCG/HR TD PT72: 1.0000 | MEDICATED_PATCH | TRANSDERMAL | 0 refills | Status: DC

## 2019-04-18 ENCOUNTER — Inpatient Hospital Stay: Payer: Medicare HMO

## 2019-04-24 ENCOUNTER — Other Ambulatory Visit: Payer: Self-pay | Admitting: *Deleted

## 2019-04-24 DIAGNOSIS — R69 Illness, unspecified: Secondary | ICD-10-CM | POA: Diagnosis not present

## 2019-04-24 MED FILL — CYCLOPHOSPHAMIDE 50 MG CAP: 50 | 28 days supply | Qty: 36 | Fill #2

## 2019-04-24 NOTE — Telephone Encounter (Signed)
Requested refill of Percocet.

## 2019-04-24 NOTE — Progress Notes (Signed)
HEMATOLOGY/ONCOLOGY CLINIC NOTE  Date of Service: 04/25/2019  Patient Care Team: [name], MD as PCP - General (Family Medicine)  Dr. Ronney Lion as [place] Specialist  [phone]  CHIEF COMPLAINTS/PURPOSE OF CONSULTATION:   Multiple Myeloma- continued management  HISTORY OF PRESENTING ILLNESS:   TAMRA KOOS is a wonderful 78 y.o. female who has been referred to [place] by Dr. 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 She is accompanied today by her husband. The pt reports that she is doing well overall.   The pt notes that she was doing extensive yard work in October 2019, and began to feel back pain a few days after this, which she attributed to muscle pain. She recalls taking a deep breath, and developing sudden acute pain, and presented to care with her PCP's office. She began exercises for her back pain, without relief, then began PT without relief, then was referred to Dr. Paulla Fore in sports medicine in late January 2020. She had an XR which revealed a compression fracture at T11, then subsequently had an MRI, and a bone marrow biopsy. The pt notes that her back pain "seemed to move around." She endorses pain "up the whole left side" of her back.  The pt notes that she is not able to stand up straight due to her back pain, which she feels limits her ability to take a deep breath, and endorses pain exacerbation when she does take a deep breath. The pt needs assistance from sitting to lying from her husband. She is using about 3 Percocet a day.  The pt notes worsened constipation since beginning Percocet, and notes that her most recent laxative order was not covered by her insurance. She is using prune juice and milk of magnesia. She took Senokot S BID without relief.  The pt reports that prior to her recent back pain, she had very few medical concerns. She endorses controlled BP, and has been monitored for DM but  after closely watching her diet her A1C decreased to 5.8. She has glaucoma, and has had surgery in both eyes. Right eye with stent. The pt sees Dr. Edilia Bo at [facility] for her eye care. The pt notes that her vision has been recently "pretty good." The pt notes that she has been advised to limit treatment with steroids for her glaucoma. She denies heart or lung problems. Denies previous back problems. Last DEXA scan 3 years ago, and endorses osteopenia. She notes that she took Fosamax for 3-4 years, and stopped 5-6 years ago. She takes Vitamin D, a multivitamin, and magnesium.  The pt notes that she quit smoking cigarettes when she was 27, started when she was about 20. The pt consumes alcohol rarely, but not since beginning narcotics. 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  Of note prior to the patient's visit today, pt has had an MRI Thoracic Spine completed on 11/27/18 with results revealing Multifocal marrow signal abnormality consistent with metastatic disease or multiple myeloma. The patient has multiple compression fractures most consistent with pathologic injuries. Extensive marrow signal abnormality makes determining age of the fractures difficult but edema is most intense in T3. 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  Most recent lab results (12/01/18) of CBC w/diff is as follows: all values are WNL except for RBC at 3.17, HGB at 10.4, HCT at 33.5, MCV at  105.7. 11/28/18 CMP revealed all values WNL except for Glucose at 105, Total Protein at 10.2, Albumin at 3.1 11/30/18 24HR UPEP revealed all values WNL except for M-spike at 75.1%. 11/28/18 Bega-2 microglobulin slightly elevated at 2.6 11/28/18 SPEP revealed M spike at 4.6g 11/28/18 Immunoglobulins revealed IgG at 6181, IgA at 24, IgM at 16, and IgE at 6.  On review of systems, pt reports constipation, back pain, stable energy levels, ankle swelling,  tenderness at T3, lower back pain, and denies abdominal pains, neck pain, and any other symptoms.   On PMHx the pt reports redundant colon, single hemorrhoid, glaucoma, HTN, osteopenia, Tonsillectomy, right eye stent and multiple eye surgeries. On Social Hx the pt reports rare alcohol use, smoked cigarettes between ages 33 and 49. 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 On Family Hx the pt reports sister died from small cell lung cancer and was a lifelong smoker, brother's daughter died of breast cancer with BRCA1 and BRCA2 mutations. Cousin with female breast cancer.   INTERVAL HISTORY:   EMRY TOBIN returns today for management and evaluation of her recently diagnosed multiple myeloma. The patient's last visit with [place] was on 04/04/2019. The pt reports that he is doing well overall.  She continues on dexamethazone, bortezomib, and denosumab. The pt has no prohibitive toxicities from continuing these treatments at this time.   The pt reports that she does have some some issues with diarrhea following cyclophosphamide. The diarrhea lasts about 12 hours a day following the treatment. She notes some bruising on her bilateral feet without trauma; there is no cramping, neuropathy, or pain.  She continues to have some back pain, but she has been up on her feet more at home.   Lab results today (04/24/19) of CBC w/diff and CMP is as follows: all values are WNL except for RBC at 3.37, hemoglobin at 10.6, HCT at 33.0, RDW at 15.9, lymphs abs at 0.3K, glucose at 102, total protein at 6.2, alkaline phosphatase 36.  On review of systems, pt denies other symptoms.     MEDICAL HISTORY:  Past Medical History:  Diagnosis Date  . Cancer ([place])    multiple myeloma  . Glaucoma   . HYPERTENSION 03/11/2007  . OSTEOPENIA 03/11/2007    SURGICAL HISTORY: Past Surgical History:  Procedure Laterality Date  . BREAST EXCISIONAL BIOPSY Right 2000  . BREAST LUMPECTOMY  1990   benign  . DILATION AND CURETTAGE OF UTERUS      bleeding at menopause. No uterine cancer  . IR RADIOLOGIST EVAL & MGMT  12/13/2018  . TONSILLECTOMY     age 42    SOCIAL HISTORY: Social History   Socioeconomic History  . Marital status: Married    Spouse name: Not on file  . Number of children: Not on file  . Years of education: Not on file  . Highest education level: Not on file  Occupational History  . Not on file  Social Needs  . Financial resource strain: Not on file  . Food insecurity    Worry: Not on file    Inability: Not on file  . Transportation needs    Medical: Not on file    Non-medical: Not on file  Tobacco Use  . Smoking status: Former Smoker    Packs/day: 0.50    Years: 7.00    Pack years: 3.50    Types: Cigarettes    Quit date: 01/04/1961    Years since quitting: 58.3  . Smokeless tobacco: Never Used  Substance  and Sexual Activity  . Alcohol use: Yes    Alcohol/week: 1.0 standard drinks    Types: 1 Standard drinks or equivalent per week  . Drug use: No  . Sexual activity: Not Currently  Lifestyle  . Physical activity    Days per week: Not on file    Minutes per session: Not on file  . Stress: Not on file  Relationships  . Social Herbalist on phone: Not on file    Gets together: Not on file    Attends religious service: Not on file    Active member of club or organization: Not on file    Attends meetings of clubs or organizations: Not on file    Relationship status: Not on file  . Intimate partner violence    Fear of current or ex partner: Not on file    Emotionally abused: Not on file    Physically abused: Not on file    Forced sexual activity: Not on file  Other Topics Concern  . Not on file  Social History Narrative   Married. Lives with husband (patient of Dr. Yong Channel). 1 son. No grandkids. 1 granddog.       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 Heart disease Mother        CHF mother died 9  . Arthritis Mother   . Glaucoma Mother        sister as well  . Alcohol abuse Father   . Suicidality Father   . Cancer Sister        lung cancer, smoker  . Heart disease Sister        aortic valve replacement  . Hyperlipidemia Brother   . Hypertension Brother   . COPD Brother   . Arthritis Sister   . Hypertension Sister   . Glaucoma Sister   . Hashimoto's thyroiditis Sister   . Hypertension Son   . Stroke Maternal Grandmother   . Colon cancer Neg Hx   . Colon polyps Neg Hx     ALLERGIES:  is allergic to ace inhibitors; benadryl [diphenhydramine]; diamox [acetazolamide]; sulfamethoxazole; lenalidomide; and penicillins.   MEDICATIONS:  Current Outpatient Medications  Medication Sig Dispense Refill  . acyclovir (ZOVIRAX) 400 MG tablet Take 1 tablet (400 mg total) by mouth 2 (two) times daily. 60 tablet 3  . ALPHAGAN P 0.1 % SOLN Apply 1 drop topically See admin instructions. Apply 2 drops in right eye 3 times a day    . amLODipine (NORVASC) 5 MG tablet Take 1 tablet (5 mg total) by mouth daily. 90 tablet 3  . aspirin EC 81 MG tablet Take 81 mg by mouth daily.    . bimatoprost (LUMIGAN) 0.03 % ophthalmic solution Place 1 drop into the right eye at bedtime.     . Cholecalciferol (VITAMIN D3) 1000 UNITS CAPS Take 1,000 Units by mouth 2 (two) times daily.     Marland Kitchen co-enzyme Q-10 50 MG capsule Take 50 mg by mouth daily.      . cyclophosphamide (CYTOXAN) 50 MG capsule Take 9 capsules (450 mg total) by mouth once a week. Take with food to minimize GI upset. Take early in the day and maintain hydration. 36 capsule 2  . dexamethasone (DECADRON) 4 MG tablet 63m (  5 tabs) on Day 15 of each cycle of treatment 15 tablet 3  . dorzolamide-timolol (COSOPT) 22.3-6.8 MG/ML ophthalmic solution Place 2 drops into both eyes 2 (two) times daily.   11  . fentaNYL (DURAGESIC) 25 MCG/HR Place  1 patch onto the skin every 3 (three) days. 10 patch 0  . hydrOXYzine (ATARAX/VISTARIL) 25 MG tablet Take 1 tablet (25 mg total) by mouth 3 (three) times daily as needed. 30 tablet 0  . magnesium hydroxide (MILK OF MAGNESIA) 400 MG/5ML suspension Take 15 mLs by mouth daily as needed for mild constipation or moderate constipation. 355 mL 0  . Magnesium Oxide 500 MG TABS Take 1 tablet by mouth daily.      . Multiple Vitamins-Minerals (MULTIVITAMIN WITH MINERALS) tablet Take 1 tablet by mouth daily.      . ondansetron (ZOFRAN) 8 MG tablet Take 1 tablet (8 mg total) by mouth 2 (two) times daily as needed (Nausea or vomiting). 30 tablet 1  . oxyCODONE-acetaminophen (PERCOCET) 5-325 MG tablet Take 1-2 tablets by mouth every 4 (four) hours as needed for moderate pain or severe pain. 90 tablet 0  . polyethylene glycol (MIRALAX) packet Take 17 g by mouth daily. 30 each 1  . prochlorperazine (COMPAZINE) 10 MG tablet Take 1 tablet (10 mg total) by mouth every 6 (six) hours as needed (Nausea or vomiting). 30 tablet 1  . senna-docusate (SENOKOT-S) 8.6-50 MG tablet Take 2 tablets by mouth at bedtime. 60 tablet 2  . vitamin C (ASCORBIC ACID) 500 MG tablet Take 500 mg by mouth daily.     No current facility-administered medications for this visit.     REVIEW OF SYSTEMS:   A 10+ POINT REVIEW OF SYSTEMS WAS OBTAINED including neurology, dermatology, psychiatry, cardiac, respiratory, lymph, extremities, GI, GU, Musculoskeletal, constitutional, breasts, reproductive, HEENT.  All pertinent positives are noted in the HPI.  All others are negative.     PHYSICAL EXAMINATION: ECOG PERFORMANCE STATUS: 1-2  Vitals:   04/25/19 0935  BP: (!) 163/86  Pulse: 68  Resp: 18  Temp: 98.8 F (37.1 C)  SpO2: 98%   Filed Weights   04/25/19 0935  Weight: 101 lb 14.4 oz (46.2 kg)   Body mass index is 18.34 kg/m.  Examined in a wheelchair.   GENERAL:alert, in no acute distress and comfortable SKIN: no acute rashes,  no significant lesions EYES: conjunctiva are pink and non-injected, sclera anicteric OROPHARYNX: MMM, no exudates, no oropharyngeal erythema or ulceration NECK: supple, no JVD LYMPH:  no palpable lymphadenopathy in the cervical, axillary or inguinal regions LUNGS: clear to auscultation b/l with normal respiratory effort HEART: regular rate & rhythm ABDOMEN:  normoactive bowel sounds , non tender, not distended. No palpable hepatosplenomegaly.  Extremity: trace pedal edema PSYCH: alert & oriented x 3 with fluent speech NEURO: no focal motor/sensory deficits    LABORATORY DATA:  I have reviewed the data as listed  . CBC Latest Ref Rng & Units 04/25/2019 04/11/2019 04/04/2019  WBC 4.0 - 10.5 K/uL 5.6 2.9(L) 4.8  Hemoglobin 12.0 - 15.0 g/dL 10.6(L) 10.1(L) 10.4(L)  Hematocrit 36.0 - 46.0 % 33.0(L) 31.8(L) 32.0(L)  Platelets 150 - 400 K/uL 307 217 380    . CMP Latest Ref Rng & Units 04/25/2019 04/11/2019 04/04/2019  Glucose 70 - 99 mg/dL 102(H) 100(H) 114(H)  BUN 8 - 23 mg/dL _0 Creatinine 0.44 - 1.00 mg/dL 0.64 0.64 0.62  Sodium 135 - 145 mmol/L 139 136 139  Potassium 3.5 - 5.1 mmol/L  4.2 4.3 4.1  Chloride 98 - 111 mmol/L 105 100 103  CO2 22 - 32 mmol/L _0 Calcium 8.9 - 10.3 mg/dL 9.0 8.8(L) 8.4(L)  Total Protein 6.5 - 8.1 g/dL 6.2(L) 6.2(L) 6.2(L)  Total Bilirubin 0.3 - 1.2 mg/dL 0.4 0.3 0.3  Alkaline Phos 38 - 126 U/L 36(L) 38 39  AST 15 - 41 U/L 16 20 14(L)  ALT 0 - 44 U/L _1 12/01/18 BM Bx:   12/01/18 Flow Cytometry:       RADIOGRAPHIC STUDIES: I have personally reviewed the radiological images as listed and agreed with the findings in the report. No results found.   ASSESSMENT & PLAN:  78 y.o. female with  1. Multiple Myeloma with IgG Lambda specificity Labs upon initial presentation from 12/01/18 CBC w/diff revealed HGB at 10.4 with MCV of 105.7. 11/28/18 CMP revealed Creatinine normal at 0.68 and Calcium normal at 8.9. 11/28/18 Beta 2  microglobulin at 2.35m (also a reading at 4.7110mon the same day). 11/30/18 24HR UPEP revealed M spike at 75.1%. 11/28/18 SPEP revealed M spike of 4.6g. 11/28/18 Immunoglobulins revealed IgG elevated at 618171m 12/01/18 BM Bx revealed hypercellular bone marrow with 70-80% CD138 immunohistochemistry (44% aspirate) lambda-restricted plasma cells as well as a kappa restricted population of B-cells 11/27/18 MRI Thoracic Spine which revealed Multifocal marrow signal abnormality consistent with metastatic disease or multiple myeloma. The patient has multiple compression fractures most consistent with pathologic injuries. Extensive marrow signal abnormality makes determining age of the fractures difficult but edema is most intense in T3. 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 2. Areas of hypermetabolic disease identified in both lungs suggesting multiple myeloma involvement. 3. 1.9 cm calcified left thyroid nodule is hypermetabolic, but Indeterminate. 4. Colon is diffusely distended with gas and stool. Imaging features would be compatible with clinical constipation. 5.  Aortic Atherosclerois.  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 She does not currently have elevated lymphocyte counts on peripheral blood  draws.   PLAN: -Discussed pt labwork today, 04/24/19; all values are WNL except for RBC at 3.37, hemoglobin at 10.6, HCT at 33.0, RDW at 15.9, lymphs abs at 0.3K, glucose at 102, total protein at 6.2, alkaline phosphatase 36. -The pt has no prohibitive toxicities from continuing dexamethazone, bortezomib, and denosumab at this time. -M spike is down to 0.4g/dl showing continued improvement. -Discussed holding stool softener the day of treatment to help with diarrhea.  -continue current pain management. -Discussed transplant verses maintenance treatment -Discussed repeat PET scan -Discussed refill for oxycodone- sent  FOLLOW UP: - f/u as per scheduled appointments for C7 and C8 of treatment -labs weekly  -MD visit as scheduled on C8D1 of treatment.     All of the patients questions were answered with apparent satisfaction. The patient knows to call the clinic with any  problems, questions or concerns.  The total time spent in the appt was 25 minutes and more than 50% was on counseling and direct patient cares.     Sullivan Lone MD MS AAHIVMS [facility] Hematology/Oncology [facility]  (Office):       [phone] (Work cell):  [phone] (Fax):           [phone]  04/25/2019 10:03 AM  I, Jacqualyn Posey, am acting as a Education administrator for Dr. Sullivan Lone.   .I have reviewed the above documentation for accuracy and completeness, and I agree with the above. [name] MD

## 2019-04-25 ENCOUNTER — Other Ambulatory Visit: Payer: Self-pay

## 2019-04-25 ENCOUNTER — Inpatient Hospital Stay: Payer: Medicare HMO

## 2019-04-25 ENCOUNTER — Telehealth: Payer: Self-pay | Admitting: Hematology

## 2019-04-25 ENCOUNTER — Inpatient Hospital Stay (HOSPITAL_BASED_OUTPATIENT_CLINIC_OR_DEPARTMENT_OTHER): Payer: Medicare HMO | Admitting: Hematology

## 2019-04-25 VITALS — BP 163/86 | HR 68 | Temp 98.8°F | Resp 18 | Ht 62.5 in | Wt 101.9 lb

## 2019-04-25 DIAGNOSIS — K5900 Constipation, unspecified: Secondary | ICD-10-CM | POA: Diagnosis not present

## 2019-04-25 DIAGNOSIS — Z5111 Encounter for antineoplastic chemotherapy: Secondary | ICD-10-CM | POA: Diagnosis not present

## 2019-04-25 DIAGNOSIS — R21 Rash and other nonspecific skin eruption: Secondary | ICD-10-CM

## 2019-04-25 DIAGNOSIS — Z7982 Long term (current) use of aspirin: Secondary | ICD-10-CM

## 2019-04-25 DIAGNOSIS — D72820 Lymphocytosis (symptomatic): Secondary | ICD-10-CM

## 2019-04-25 DIAGNOSIS — M549 Dorsalgia, unspecified: Secondary | ICD-10-CM | POA: Diagnosis not present

## 2019-04-25 DIAGNOSIS — M7989 Other specified soft tissue disorders: Secondary | ICD-10-CM | POA: Diagnosis not present

## 2019-04-25 DIAGNOSIS — R197 Diarrhea, unspecified: Secondary | ICD-10-CM

## 2019-04-25 DIAGNOSIS — C9000 Multiple myeloma not having achieved remission: Secondary | ICD-10-CM

## 2019-04-25 DIAGNOSIS — Z7189 Other specified counseling: Secondary | ICD-10-CM

## 2019-04-25 DIAGNOSIS — M545 Low back pain: Secondary | ICD-10-CM

## 2019-04-25 DIAGNOSIS — Z79899 Other long term (current) drug therapy: Secondary | ICD-10-CM

## 2019-04-25 DIAGNOSIS — G893 Neoplasm related pain (acute) (chronic): Secondary | ICD-10-CM

## 2019-04-25 DIAGNOSIS — M8580 Other specified disorders of bone density and structure, unspecified site: Secondary | ICD-10-CM

## 2019-04-25 DIAGNOSIS — M25473 Effusion, unspecified ankle: Secondary | ICD-10-CM

## 2019-04-25 DIAGNOSIS — H409 Unspecified glaucoma: Secondary | ICD-10-CM

## 2019-04-25 DIAGNOSIS — Z87891 Personal history of nicotine dependence: Secondary | ICD-10-CM

## 2019-04-25 DIAGNOSIS — Z801 Family history of malignant neoplasm of trachea, bronchus and lung: Secondary | ICD-10-CM

## 2019-04-25 DIAGNOSIS — I10 Essential (primary) hypertension: Secondary | ICD-10-CM

## 2019-04-25 DIAGNOSIS — Z803 Family history of malignant neoplasm of breast: Secondary | ICD-10-CM

## 2019-04-25 LAB — CMP (CANCER CENTER ONLY)
ALT: 19 U/L (ref 0–44)
AST: 16 U/L (ref 15–41)
Albumin: 3.8 g/dL (ref 3.5–5.0)
Alkaline Phosphatase: 36 U/L — ABNORMAL LOW (ref 38–126)
Anion gap: 7 (ref 5–15)
BUN: 14 mg/dL (ref 8–23)
CO2: 27 mmol/L (ref 22–32)
Calcium: 9 mg/dL (ref 8.9–10.3)
Chloride: 105 mmol/L (ref 98–111)
Creatinine: 0.64 mg/dL (ref 0.44–1.00)
GFR, Est AFR Am: >60 mL/min (ref >60)
GFR, Estimated: >60 mL/min (ref >60)
Glucose, Bld: 102 mg/dL — ABNORMAL HIGH (ref 70–99)
Potassium: 4.2 mmol/L (ref 3.5–5.1)
Sodium: 139 mmol/L (ref 135–145)
Total Bilirubin: 0.4 mg/dL (ref 0.3–1.2)
Total Protein: 6.2 g/dL — ABNORMAL LOW (ref 6.5–8.1)

## 2019-04-25 LAB — CBC WITH DIFFERENTIAL/PLATELET
Abs Immature Granulocytes: 0.02 10*3/uL (ref 0.00–0.07)
Basophils Absolute: 0.1 10*3/uL (ref 0.0–0.1)
Basophils Relative: 1 %
Eosinophils Absolute: 0.1 10*3/uL (ref 0.0–0.5)
Eosinophils Relative: 1 %
HCT: 33 % — ABNORMAL LOW (ref 36.0–46.0)
Hemoglobin: 10.6 g/dL — ABNORMAL LOW (ref 12.0–15.0)
Immature Granulocytes: 0 %
Lymphocytes Relative: 6 %
Lymphs Abs: 0.3 10*3/uL — ABNORMAL LOW (ref 0.7–4.0)
MCH: 31.5 pg (ref 26.0–34.0)
MCHC: 32.1 g/dL (ref 30.0–36.0)
MCV: 97.9 fL (ref 80.0–100.0)
Monocytes Absolute: 0.4 10*3/uL (ref 0.1–1.0)
Monocytes Relative: 7 %
Neutro Abs: 4.8 10*3/uL (ref 1.7–7.7)
Neutrophils Relative %: 85 %
Platelets: 307 10*3/uL (ref 150–400)
RBC: 3.37 MIL/uL — ABNORMAL LOW (ref 3.87–5.11)
RDW: 15.9 % — ABNORMAL HIGH (ref 11.5–15.5)
WBC: 5.6 10*3/uL (ref 4.0–10.5)
nRBC: 0 % (ref 0.0–0.2)

## 2019-04-25 MED ORDER — BORTEZOMIB CHEMO SQ INJECTION 3.5 MG (2.5MG/ML)
1.3000 mg/m2 | Freq: Once | INTRAMUSCULAR | Status: AC
  Administered 2019-04-25: 12:00:00 2 mg via SUBCUTANEOUS
  Filled 2019-04-25: qty 0.8

## 2019-04-25 MED ORDER — DEXAMETHASONE 4 MG PO TABS
ORAL_TABLET | ORAL | Status: AC
  Filled 2019-04-25: qty 5

## 2019-04-25 MED ORDER — OXYCODONE-ACETAMINOPHEN 5-325 MG PO TABS: 1.0000 | ORAL_TABLET | ORAL | 0 refills | Status: DC | PRN

## 2019-04-25 MED ORDER — DENOSUMAB 120 MG/1.7ML ~~LOC~~ SOLN
SUBCUTANEOUS | Status: AC
  Filled 2019-04-25: qty 1.7

## 2019-04-25 MED ORDER — PROCHLORPERAZINE MALEATE 10 MG PO TABS
10.0000 mg | ORAL_TABLET | Freq: Once | ORAL | Status: AC
  Administered 2019-04-25: 10 mg via ORAL

## 2019-04-25 MED ORDER — PROCHLORPERAZINE MALEATE 10 MG PO TABS
ORAL_TABLET | ORAL | Status: AC
  Filled 2019-04-25: qty 1

## 2019-04-25 MED ORDER — DEXAMETHASONE 4 MG PO TABS
20.0000 mg | ORAL_TABLET | Freq: Once | ORAL | Status: AC
  Administered 2019-04-25: 11:00:00 20 mg via ORAL

## 2019-04-25 MED ORDER — DENOSUMAB 120 MG/1.7ML ~~LOC~~ SOLN
120.0000 mg | Freq: Once | SUBCUTANEOUS | Status: AC
  Administered 2019-04-25: 12:00:00 120 mg via SUBCUTANEOUS

## 2019-04-25 NOTE — Telephone Encounter (Signed)
Scheduled appt per 7/21 los. °

## 2019-04-25 NOTE — Patient Instructions (Addendum)
[facility] Discharge Instructions for Patients Receiving Chemotherapy  Today you received the following chemotherapy agents Velcade.  To help prevent nausea and vomiting after your treatment, we encourage you to take your nausea medication as directed.  If you develop nausea and vomiting that is not controlled by your nausea medication, call the clinic.   BELOW ARE SYMPTOMS THAT SHOULD BE REPORTED IMMEDIATELY:  *FEVER GREATER THAN 100.5 F  *CHILLS WITH OR WITHOUT FEVER  NAUSEA AND VOMITING THAT IS NOT CONTROLLED WITH YOUR NAUSEA MEDICATION  *UNUSUAL SHORTNESS OF BREATH  *UNUSUAL BRUISING OR BLEEDING  TENDERNESS IN MOUTH AND THROAT WITH OR WITHOUT PRESENCE OF ULCERS  *URINARY PROBLEMS  *BOWEL PROBLEMS  UNUSUAL RASH Items with * indicate a potential emergency and should be followed up as soon as possible.  Feel free to call the clinic should you have any questions or concerns. The clinic phone number is [phone].  Please show the CHEMO ALERT CARD at check-in to the Emergency Department and triage nurse.   

## 2019-04-27 LAB — MULTIPLE MYELOMA PANEL, SERUM
Albumin SerPl Elph-Mcnc: 3.8 g/dL (ref 2.9–4.4)
Albumin/Glob SerPl: 1.9 — ABNORMAL HIGH (ref 0.7–1.7)
Alpha 1: 0.2 g/dL (ref 0.0–0.4)
Alpha2 Glob SerPl Elph-Mcnc: 0.7 g/dL (ref 0.4–1.0)
B-Globulin SerPl Elph-Mcnc: 1 g/dL (ref 0.7–1.3)
Gamma Glob SerPl Elph-Mcnc: 0.3 g/dL — ABNORMAL LOW (ref 0.4–1.8)
Globulin, Total: 2.1 g/dL — ABNORMAL LOW (ref 2.2–3.9)
IgA: 39 mg/dL — ABNORMAL LOW (ref 64–422)
IgG (Immunoglobin G), Serum: 702 mg/dL (ref 586–1602)
IgM (Immunoglobulin M), Srm: 26 mg/dL (ref 26–217)
M Protein SerPl Elph-Mcnc: 0.4 g/dL — ABNORMAL HIGH
Total Protein ELP: 5.9 g/dL — ABNORMAL LOW (ref 6.0–8.5)

## 2019-04-28 ENCOUNTER — Inpatient Hospital Stay: Payer: Medicare HMO

## 2019-04-28 ENCOUNTER — Other Ambulatory Visit: Payer: Self-pay

## 2019-04-28 VITALS — BP 167/87 | HR 63 | Temp 98.9°F | Resp 16

## 2019-04-28 DIAGNOSIS — M7989 Other specified soft tissue disorders: Secondary | ICD-10-CM | POA: Diagnosis not present

## 2019-04-28 DIAGNOSIS — K5900 Constipation, unspecified: Secondary | ICD-10-CM | POA: Diagnosis not present

## 2019-04-28 DIAGNOSIS — M549 Dorsalgia, unspecified: Secondary | ICD-10-CM | POA: Diagnosis not present

## 2019-04-28 DIAGNOSIS — C9000 Multiple myeloma not having achieved remission: Secondary | ICD-10-CM | POA: Diagnosis not present

## 2019-04-28 DIAGNOSIS — Z7189 Other specified counseling: Secondary | ICD-10-CM

## 2019-04-28 DIAGNOSIS — M8580 Other specified disorders of bone density and structure, unspecified site: Secondary | ICD-10-CM | POA: Diagnosis not present

## 2019-04-28 DIAGNOSIS — R21 Rash and other nonspecific skin eruption: Secondary | ICD-10-CM | POA: Diagnosis not present

## 2019-04-28 DIAGNOSIS — R197 Diarrhea, unspecified: Secondary | ICD-10-CM | POA: Diagnosis not present

## 2019-04-28 DIAGNOSIS — Z5111 Encounter for antineoplastic chemotherapy: Secondary | ICD-10-CM | POA: Diagnosis not present

## 2019-04-28 DIAGNOSIS — D72820 Lymphocytosis (symptomatic): Secondary | ICD-10-CM | POA: Diagnosis not present

## 2019-04-28 DIAGNOSIS — M25473 Effusion, unspecified ankle: Secondary | ICD-10-CM | POA: Diagnosis not present

## 2019-04-28 MED ORDER — PROCHLORPERAZINE MALEATE 10 MG PO TABS
ORAL_TABLET | ORAL | Status: AC
  Filled 2019-04-28: qty 1

## 2019-04-28 MED ORDER — PROCHLORPERAZINE MALEATE 10 MG PO TABS
10.0000 mg | ORAL_TABLET | Freq: Once | ORAL | Status: AC
  Administered 2019-04-28: 10 mg via ORAL

## 2019-04-28 MED ORDER — BORTEZOMIB CHEMO SQ INJECTION 3.5 MG (2.5MG/ML)
1.3000 mg/m2 | Freq: Once | INTRAMUSCULAR | Status: AC
  Administered 2019-04-28: 2 mg via SUBCUTANEOUS
  Filled 2019-04-28: qty 0.8

## 2019-04-28 NOTE — Patient Instructions (Signed)
[facility] Discharge Instructions for Patients Receiving Chemotherapy  Today you received the following chemotherapy agents Velcade.  To help prevent nausea and vomiting after your treatment, we encourage you to take your nausea medication as directed.  If you develop nausea and vomiting that is not controlled by your nausea medication, call the clinic.   BELOW ARE SYMPTOMS THAT SHOULD BE REPORTED IMMEDIATELY:  *FEVER GREATER THAN 100.5 F  *CHILLS WITH OR WITHOUT FEVER  NAUSEA AND VOMITING THAT IS NOT CONTROLLED WITH YOUR NAUSEA MEDICATION  *UNUSUAL SHORTNESS OF BREATH  *UNUSUAL BRUISING OR BLEEDING  TENDERNESS IN MOUTH AND THROAT WITH OR WITHOUT PRESENCE OF ULCERS  *URINARY PROBLEMS  *BOWEL PROBLEMS  UNUSUAL RASH Items with * indicate a potential emergency and should be followed up as soon as possible.  Feel free to call the clinic should you have any questions or concerns. The clinic phone number is [phone].  Please show the CHEMO ALERT CARD at check-in to the Emergency Department and triage nurse.   

## 2019-05-02 ENCOUNTER — Telehealth: Payer: Self-pay | Admitting: *Deleted

## 2019-05-02 ENCOUNTER — Inpatient Hospital Stay: Payer: Medicare HMO

## 2019-05-02 ENCOUNTER — Other Ambulatory Visit: Payer: Self-pay

## 2019-05-02 VITALS — BP 161/85 | HR 70 | Temp 99.1°F | Resp 16

## 2019-05-02 DIAGNOSIS — Z5111 Encounter for antineoplastic chemotherapy: Secondary | ICD-10-CM | POA: Diagnosis not present

## 2019-05-02 DIAGNOSIS — K5900 Constipation, unspecified: Secondary | ICD-10-CM | POA: Diagnosis not present

## 2019-05-02 DIAGNOSIS — G893 Neoplasm related pain (acute) (chronic): Secondary | ICD-10-CM

## 2019-05-02 DIAGNOSIS — C9000 Multiple myeloma not having achieved remission: Secondary | ICD-10-CM | POA: Diagnosis not present

## 2019-05-02 DIAGNOSIS — M549 Dorsalgia, unspecified: Secondary | ICD-10-CM | POA: Diagnosis not present

## 2019-05-02 DIAGNOSIS — Z7189 Other specified counseling: Secondary | ICD-10-CM

## 2019-05-02 DIAGNOSIS — M25473 Effusion, unspecified ankle: Secondary | ICD-10-CM | POA: Diagnosis not present

## 2019-05-02 DIAGNOSIS — D72820 Lymphocytosis (symptomatic): Secondary | ICD-10-CM | POA: Diagnosis not present

## 2019-05-02 DIAGNOSIS — R197 Diarrhea, unspecified: Secondary | ICD-10-CM | POA: Diagnosis not present

## 2019-05-02 DIAGNOSIS — M8580 Other specified disorders of bone density and structure, unspecified site: Secondary | ICD-10-CM | POA: Diagnosis not present

## 2019-05-02 DIAGNOSIS — M7989 Other specified soft tissue disorders: Secondary | ICD-10-CM | POA: Diagnosis not present

## 2019-05-02 DIAGNOSIS — R21 Rash and other nonspecific skin eruption: Secondary | ICD-10-CM | POA: Diagnosis not present

## 2019-05-02 LAB — CMP (CANCER CENTER ONLY)
ALT: 19 U/L (ref 0–44)
AST: 16 U/L (ref 15–41)
Albumin: 3.9 g/dL (ref 3.5–5.0)
Alkaline Phosphatase: 34 U/L — ABNORMAL LOW (ref 38–126)
Anion gap: 8 (ref 5–15)
BUN: 14 mg/dL (ref 8–23)
CO2: 28 mmol/L (ref 22–32)
Calcium: 9.1 mg/dL (ref 8.9–10.3)
Chloride: 101 mmol/L (ref 98–111)
Creatinine: 0.65 mg/dL (ref 0.44–1.00)
GFR, Est AFR Am: >60 mL/min (ref >60)
GFR, Estimated: >60 mL/min (ref >60)
Glucose, Bld: 104 mg/dL — ABNORMAL HIGH (ref 70–99)
Potassium: 4.1 mmol/L (ref 3.5–5.1)
Sodium: 137 mmol/L (ref 135–145)
Total Bilirubin: 0.3 mg/dL (ref 0.3–1.2)
Total Protein: 6.1 g/dL — ABNORMAL LOW (ref 6.5–8.1)

## 2019-05-02 LAB — CBC WITH DIFFERENTIAL/PLATELET
Abs Immature Granulocytes: 0.05 10*3/uL (ref 0.00–0.07)
Basophils Absolute: 0 10*3/uL (ref 0.0–0.1)
Basophils Relative: 1 %
Eosinophils Absolute: 0.1 10*3/uL (ref 0.0–0.5)
Eosinophils Relative: 1 %
HCT: 31.8 % — ABNORMAL LOW (ref 36.0–46.0)
Hemoglobin: 10.6 g/dL — ABNORMAL LOW (ref 12.0–15.0)
Immature Granulocytes: 1 %
Lymphocytes Relative: 10 %
Lymphs Abs: 0.4 10*3/uL — ABNORMAL LOW (ref 0.7–4.0)
MCH: 32.6 pg (ref 26.0–34.0)
MCHC: 33.3 g/dL (ref 30.0–36.0)
MCV: 97.8 fL (ref 80.0–100.0)
Monocytes Absolute: 0.4 10*3/uL (ref 0.1–1.0)
Monocytes Relative: 10 %
Neutro Abs: 3.2 10*3/uL (ref 1.7–7.7)
Neutrophils Relative %: 77 %
Platelets: 155 10*3/uL (ref 150–400)
RBC: 3.25 MIL/uL — ABNORMAL LOW (ref 3.87–5.11)
RDW: 16.1 % — ABNORMAL HIGH (ref 11.5–15.5)
WBC: 4.1 10*3/uL (ref 4.0–10.5)
nRBC: 0 % (ref 0.0–0.2)

## 2019-05-02 MED ORDER — PROCHLORPERAZINE MALEATE 10 MG PO TABS
ORAL_TABLET | ORAL | Status: AC
  Filled 2019-05-02: qty 1

## 2019-05-02 MED ORDER — BORTEZOMIB CHEMO SQ INJECTION 3.5 MG (2.5MG/ML)
1.3000 mg/m2 | Freq: Once | INTRAMUSCULAR | Status: AC
  Administered 2019-05-02: 2 mg via SUBCUTANEOUS
  Filled 2019-05-02: qty 0.8

## 2019-05-02 MED ORDER — PROCHLORPERAZINE MALEATE 10 MG PO TABS
10.0000 mg | ORAL_TABLET | Freq: Once | ORAL | Status: AC
  Administered 2019-05-02: 10 mg via ORAL

## 2019-05-02 MED ORDER — DEXAMETHASONE 4 MG PO TABS
20.0000 mg | ORAL_TABLET | Freq: Once | ORAL | Status: AC
  Administered 2019-05-02: 20 mg via ORAL

## 2019-05-02 MED ORDER — DEXAMETHASONE 4 MG PO TABS
ORAL_TABLET | ORAL | Status: AC
  Filled 2019-05-02: qty 5

## 2019-05-02 NOTE — Telephone Encounter (Signed)
Patient called to state she experienced episode of chest pain on Sunday while seated in chair. Pain located between waistline and breasts, with no radiation to back or sides. Pain was persistent, but never increased in severity.  Ambulated w/assist to bathroom to urinate. Pain was gone by the time patient left bathroom and did not return. Patient denies shortness of breath or dizziness with pain. She speculated that it might have been a gas pain, but whatever it was, it never came back. Advised her that Dr. Irene Limbo will be given information.

## 2019-05-02 NOTE — Patient Instructions (Signed)
[facility] Discharge Instructions for Patients Receiving Chemotherapy  Today you received the following chemotherapy agents Velcade.  To help prevent nausea and vomiting after your treatment, we encourage you to take your nausea medication as directed.  If you develop nausea and vomiting that is not controlled by your nausea medication, call the clinic.   BELOW ARE SYMPTOMS THAT SHOULD BE REPORTED IMMEDIATELY:  *FEVER GREATER THAN 100.5 F  *CHILLS WITH OR WITHOUT FEVER  NAUSEA AND VOMITING THAT IS NOT CONTROLLED WITH YOUR NAUSEA MEDICATION  *UNUSUAL SHORTNESS OF BREATH  *UNUSUAL BRUISING OR BLEEDING  TENDERNESS IN MOUTH AND THROAT WITH OR WITHOUT PRESENCE OF ULCERS  *URINARY PROBLEMS  *BOWEL PROBLEMS  UNUSUAL RASH Items with * indicate a potential emergency and should be followed up as soon as possible.  Feel free to call the clinic should you have any questions or concerns. The clinic phone number is [phone].  Please show the CHEMO ALERT CARD at check-in to the Emergency Department and triage nurse.   

## 2019-05-05 ENCOUNTER — Other Ambulatory Visit: Payer: Self-pay

## 2019-05-05 ENCOUNTER — Inpatient Hospital Stay: Payer: Medicare HMO

## 2019-05-05 VITALS — BP 172/78 | HR 85 | Temp 98.7°F | Resp 16 | Wt 98.0 lb

## 2019-05-05 DIAGNOSIS — M25473 Effusion, unspecified ankle: Secondary | ICD-10-CM | POA: Diagnosis not present

## 2019-05-05 DIAGNOSIS — M8580 Other specified disorders of bone density and structure, unspecified site: Secondary | ICD-10-CM | POA: Diagnosis not present

## 2019-05-05 DIAGNOSIS — Z7189 Other specified counseling: Secondary | ICD-10-CM

## 2019-05-05 DIAGNOSIS — M7989 Other specified soft tissue disorders: Secondary | ICD-10-CM | POA: Diagnosis not present

## 2019-05-05 DIAGNOSIS — R197 Diarrhea, unspecified: Secondary | ICD-10-CM | POA: Diagnosis not present

## 2019-05-05 DIAGNOSIS — C9000 Multiple myeloma not having achieved remission: Secondary | ICD-10-CM

## 2019-05-05 DIAGNOSIS — M549 Dorsalgia, unspecified: Secondary | ICD-10-CM | POA: Diagnosis not present

## 2019-05-05 DIAGNOSIS — K5900 Constipation, unspecified: Secondary | ICD-10-CM | POA: Diagnosis not present

## 2019-05-05 DIAGNOSIS — R21 Rash and other nonspecific skin eruption: Secondary | ICD-10-CM | POA: Diagnosis not present

## 2019-05-05 DIAGNOSIS — Z5111 Encounter for antineoplastic chemotherapy: Secondary | ICD-10-CM | POA: Diagnosis not present

## 2019-05-05 DIAGNOSIS — D72820 Lymphocytosis (symptomatic): Secondary | ICD-10-CM | POA: Diagnosis not present

## 2019-05-05 MED ORDER — BORTEZOMIB CHEMO SQ INJECTION 3.5 MG (2.5MG/ML)
1.3000 mg/m2 | Freq: Once | INTRAMUSCULAR | Status: AC
  Administered 2019-05-05: 2 mg via SUBCUTANEOUS
  Filled 2019-05-05: qty 0.8

## 2019-05-05 MED ORDER — PROCHLORPERAZINE MALEATE 10 MG PO TABS
ORAL_TABLET | ORAL | Status: AC
  Filled 2019-05-05: qty 1

## 2019-05-05 MED ORDER — PROCHLORPERAZINE MALEATE 10 MG PO TABS
10.0000 mg | ORAL_TABLET | Freq: Once | ORAL | Status: AC
  Administered 2019-05-05: 10 mg via ORAL

## 2019-05-05 NOTE — Patient Instructions (Signed)
[facility] Discharge Instructions for Patients Receiving Chemotherapy  Today you received the following chemotherapy agents Velcade.  To help prevent nausea and vomiting after your treatment, we encourage you to take your nausea medication as directed.  If you develop nausea and vomiting that is not controlled by your nausea medication, call the clinic.   BELOW ARE SYMPTOMS THAT SHOULD BE REPORTED IMMEDIATELY:  *FEVER GREATER THAN 100.5 F  *CHILLS WITH OR WITHOUT FEVER  NAUSEA AND VOMITING THAT IS NOT CONTROLLED WITH YOUR NAUSEA MEDICATION  *UNUSUAL SHORTNESS OF BREATH  *UNUSUAL BRUISING OR BLEEDING  TENDERNESS IN MOUTH AND THROAT WITH OR WITHOUT PRESENCE OF ULCERS  *URINARY PROBLEMS  *BOWEL PROBLEMS  UNUSUAL RASH Items with * indicate a potential emergency and should be followed up as soon as possible.  Feel free to call the clinic should you have any questions or concerns. The clinic phone number is [phone].  Please show the CHEMO ALERT CARD at check-in to the Emergency Department and triage nurse.   

## 2019-05-09 ENCOUNTER — Other Ambulatory Visit: Payer: Self-pay | Admitting: *Deleted

## 2019-05-09 MED ORDER — OXYCODONE-ACETAMINOPHEN 5-325 MG PO TABS: 1.0000 | ORAL_TABLET | ORAL | 0 refills | Status: DC | PRN

## 2019-05-09 NOTE — Telephone Encounter (Signed)
Patient called to request refill.

## 2019-05-15 ENCOUNTER — Other Ambulatory Visit: Payer: Self-pay | Admitting: *Deleted

## 2019-05-15 NOTE — Telephone Encounter (Signed)
Patient requested Fentanyl refill

## 2019-05-15 NOTE — Progress Notes (Signed)
HEMATOLOGY/ONCOLOGY CLINIC NOTE  Date of Service: 05/15/2019  Patient Care Team: [name], MD as PCP - General (Family Medicine) [name], MD as Consulting Physician (Hematology) Bond, [name], MD as Referring Physician (Ophthalmology)  Dr. Ronney Lion as [place] Specialist  [phone]  CHIEF COMPLAINTS/PURPOSE OF CONSULTATION:   Multiple Myeloma- continued management  HISTORY OF PRESENTING ILLNESS:   Megan Olson is a wonderful 78 y.o. female who has been referred to [place] by Dr. 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 She is accompanied today by her husband. The pt reports that she is doing well overall.   The pt notes that she was doing extensive yard work in October 2019, and began to feel back pain a few days after this, which she attributed to muscle pain. She recalls taking a deep breath, and developing sudden acute pain, and presented to care with her PCP's office. She began exercises for her back pain, without relief, then began PT without relief, then was referred to Dr. Paulla Fore in sports medicine in late January 2020. She had an XR which revealed a compression fracture at T11, then subsequently had an MRI, and a bone marrow biopsy. The pt notes that her back pain "seemed to move around." She endorses pain "up the whole left side" of her back.  The pt notes that she is not able to stand up straight due to her back pain, which she feels limits her ability to take a deep breath, and endorses pain exacerbation when she does take a deep breath. The pt needs assistance from sitting to lying from her husband. She is using about 3 Percocet a day.  The pt notes worsened constipation since beginning Percocet, and notes that her most recent laxative order was not covered by her insurance. She is using prune juice and milk of magnesia. She took Senokot S BID without relief.  The pt reports that  prior to her recent back pain, she had very few medical concerns. She endorses controlled BP, and has been monitored for DM but after closely watching her diet her A1C decreased to 5.8. She has glaucoma, and has had surgery in both eyes. Right eye with stent. The pt sees Dr. Edilia Bo at [facility] for her eye care. The pt notes that her vision has been recently "pretty good." The pt notes that she has been advised to limit treatment with steroids for her glaucoma. She denies heart or lung problems. Denies previous back problems. Last DEXA scan 3 years ago, and endorses osteopenia. She notes that she took Fosamax for 3-4 years, and stopped 5-6 years ago. She takes Vitamin D, a multivitamin, and magnesium.  The pt notes that she quit smoking cigarettes when she was 27, started when she was about 20. The pt consumes alcohol rarely, but not since beginning narcotics. 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  Of note prior to the patient's visit today, pt has had an MRI Thoracic Spine completed on 11/27/18 with results revealing Multifocal marrow signal abnormality consistent with metastatic disease or multiple myeloma. The patient has multiple compression fractures most consistent with pathologic injuries. Extensive marrow signal abnormality makes determining age of the fractures difficult but edema is most intense in T3. 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  Most recent lab results (12/01/18) of CBC w/diff is as follows: all  values are WNL except for RBC at 3.17, HGB at 10.4, HCT at 33.5, MCV at 105.7. 11/28/18 CMP revealed all values WNL except for Glucose at 105, Total Protein at 10.2, Albumin at 3.1 11/30/18 24HR UPEP revealed all values WNL except for M-spike at 75.1%. 11/28/18 Bega-2 microglobulin slightly elevated at 2.6 11/28/18 SPEP revealed M spike at 4.6g 11/28/18 Immunoglobulins revealed IgG at 6181, IgA at 24,  IgM at 16, and IgE at 6.  On review of systems, pt reports constipation, back pain, stable energy levels, ankle swelling, tenderness at T3, lower back pain, and denies abdominal pains, neck pain, and any other symptoms.   On PMHx the pt reports redundant colon, single hemorrhoid, glaucoma, HTN, osteopenia, Tonsillectomy, right eye stent and multiple eye surgeries. On Social Hx the pt reports rare alcohol use, smoked cigarettes between ages 89 and 13. 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 On Family Hx the pt reports sister died from small cell lung cancer and was a lifelong smoker, brother's daughter died of breast cancer with BRCA1 and BRCA2 mutations. Cousin with female breast cancer.   INTERVAL HISTORY:   MILIANA GANGWER returns today for management and evaluation of her multiple myeloma and C8D1 of dexamethazone, bortezomib, and denosumab at this time. The patient's last visit with [place] was on 04/25/2019. The pt reports that she is doing well overall.  The pt reports that she has been staying physically active by walking outside with her husband. She also woke up one morning and did not have any pain for the first 30 minutes and was very excited. She reports moving her bowels well and denies leg swelling, eye changes, and tingling/numbness in her hands or feet. She has not had to take her water pills recently.  Lab results today (05/16/2019) of CBC w/diff and CMP is as follows: all values are WNL except for RBC at 3.13, HGB at 10.2, HCT at 31.0, RDW at 16.7, lymphs abs at 0.2, glucose bld at 112, total protein at 6.1, alkaline phosphatase at 33. 05/16/2019 MMP - M spike is down to 0.2g/dl.  On review of systems, pt reports moving her bowels well, staying physically active and denies leg swelling, tingling or numbness in the hands or feet, eye changes, and any other symptoms.   MEDICAL HISTORY:  Past Medical History:  Diagnosis Date  . Cancer ([place])    multiple myeloma  . Glaucoma   . HYPERTENSION  03/11/2007  . OSTEOPENIA 03/11/2007    SURGICAL HISTORY: Past Surgical History:  Procedure Laterality Date  . BREAST EXCISIONAL BIOPSY Right 2000  . BREAST LUMPECTOMY  1990   benign  . DILATION AND CURETTAGE OF UTERUS     bleeding at menopause. No uterine cancer  . IR RADIOLOGIST EVAL & MGMT  12/13/2018  . TONSILLECTOMY     age 75    SOCIAL HISTORY: Social History   Socioeconomic History  . Marital status: Married    Spouse name: Not on file  . Number of children: Not on file  . Years of education: Not on file  . Highest education level: Not on file  Occupational History  . Not on file  Social Needs  . Financial resource strain: Not on file  . Food insecurity    Worry: Not on file    Inability: Not on file  . Transportation needs    Medical: Not on file    Non-medical: Not on file  Tobacco Use  . Smoking status: Former Smoker    Packs/day:  0.50    Years: 7.00    Pack years: 3.50    Types: Cigarettes    Quit date: 01/04/1961    Years since quitting: 58.3  . Smokeless tobacco: Never Used  Substance and Sexual Activity  . Alcohol use: Yes    Alcohol/week: 1.0 standard drinks    Types: 1 Standard drinks or equivalent per week  . Drug use: No  . Sexual activity: Not Currently  Lifestyle  . Physical activity    Days per week: Not on file    Minutes per session: Not on file  . Stress: Not on file  Relationships  . Social Herbalist on phone: Not on file    Gets together: Not on file    Attends religious service: Not on file    Active member of club or organization: Not on file    Attends meetings of clubs or organizations: Not on file    Relationship status: Not on file  . Intimate partner violence    Fear of current or ex partner: Not on file    Emotionally abused: Not on file    Physically abused: Not on file    Forced sexual activity: Not on file  Other Topics Concern  . Not on file  Social History Narrative   Married. Lives with husband (patient  of Dr. Yong Channel). 1 son. No grandkids. 1 granddog.       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 Heart disease Mother        CHF mother died 44  . Arthritis Mother   . Glaucoma Mother        sister as well  . Alcohol abuse Father   . Suicidality Father   . Cancer Sister        lung cancer, smoker  . Heart disease Sister        aortic valve replacement  . Hyperlipidemia Brother   . Hypertension Brother   . COPD Brother   . Arthritis Sister   . Hypertension Sister   . Glaucoma Sister   . Hashimoto's thyroiditis Sister   . Hypertension Son   . Stroke Maternal Grandmother   . Colon cancer Neg Hx   . Colon polyps Neg Hx     ALLERGIES:  is allergic to ace inhibitors; benadryl [diphenhydramine]; diamox [acetazolamide]; sulfamethoxazole; lenalidomide; and penicillins.   MEDICATIONS:  Current Outpatient Medications  Medication Sig Dispense Refill  . acyclovir (ZOVIRAX) 400 MG tablet Take 1 tablet (400 mg total) by mouth 2 (two) times daily. 60 tablet 3  . ALPHAGAN P 0.1 % SOLN Apply 1 drop topically See admin instructions. Apply 2 drops in right eye 3 times a day    . amLODipine (NORVASC) 5 MG tablet Take 1 tablet (5 mg total) by mouth daily. 90 tablet 3  . aspirin EC 81 MG tablet Take 81 mg by mouth daily.    . bimatoprost (LUMIGAN) 0.03 % ophthalmic solution Place 1 drop into the right eye at bedtime.     . Cholecalciferol (VITAMIN D3) 1000 UNITS CAPS Take 1,000 Units by mouth 2 (two) times daily.     Marland Kitchen co-enzyme Q-10 50 MG capsule Take 50 mg by mouth daily.      . cyclophosphamide (CYTOXAN) 50 MG  capsule Take 9 capsules (450 mg total) by mouth once a week. Take with food to minimize GI upset. Take early in the day and maintain hydration. 36 capsule 2  . dexamethasone (DECADRON) 4 MG tablet '20mg'$   (5 tabs) on Day 15 of each cycle of treatment 15 tablet 3  . dorzolamide-timolol (COSOPT) 22.3-6.8 MG/ML ophthalmic solution Place 2 drops into both eyes 2 (two) times daily.   11  . fentaNYL (DURAGESIC) 25 MCG/HR Place 1 patch onto the skin every 3 (three) days. 10 patch 0  . hydrOXYzine (ATARAX/VISTARIL) 25 MG tablet Take 1 tablet (25 mg total) by mouth 3 (three) times daily as needed. 30 tablet 0  . magnesium hydroxide (MILK OF MAGNESIA) 400 MG/5ML suspension Take 15 mLs by mouth daily as needed for mild constipation or moderate constipation. 355 mL 0  . Magnesium Oxide 500 MG TABS Take 1 tablet by mouth daily.      . Multiple Vitamins-Minerals (MULTIVITAMIN WITH MINERALS) tablet Take 1 tablet by mouth daily.      . ondansetron (ZOFRAN) 8 MG tablet Take 1 tablet (8 mg total) by mouth 2 (two) times daily as needed (Nausea or vomiting). 30 tablet 1  . oxyCODONE-acetaminophen (PERCOCET) 5-325 MG tablet Take 1-2 tablets by mouth every 4 (four) hours as needed for moderate pain or severe pain. 90 tablet 0  . polyethylene glycol (MIRALAX) packet Take 17 g by mouth daily. 30 each 1  . prochlorperazine (COMPAZINE) 10 MG tablet Take 1 tablet (10 mg total) by mouth every 6 (six) hours as needed (Nausea or vomiting). 30 tablet 1  . senna-docusate (SENOKOT-S) 8.6-50 MG tablet Take 2 tablets by mouth at bedtime. 60 tablet 2  . vitamin C (ASCORBIC ACID) 500 MG tablet Take 500 mg by mouth daily.     No current facility-administered medications for this visit.     REVIEW OF SYSTEMS:    A 10+ POINT REVIEW OF SYSTEMS WAS OBTAINED including neurology, dermatology, psychiatry, cardiac, respiratory, lymph, extremities, GI, GU, Musculoskeletal, constitutional, breasts, reproductive, HEENT.  All pertinent positives are noted in the HPI.  All others are negative.   PHYSICAL EXAMINATION: ECOG PERFORMANCE STATUS: 1-2  There were no vitals filed for this visit. There were no vitals filed for this visit. There is  no height or weight on file to calculate BMI.   GENERAL:alert, in no acute distress and comfortable SKIN: no acute rashes, no significant lesions EYES: conjunctiva are pink and non-injected, sclera anicteric OROPHARYNX: MMM, no exudates, no oropharyngeal erythema or ulceration NECK: supple, no JVD LYMPH:  no palpable lymphadenopathy in the cervical, axillary or inguinal regions LUNGS: clear to auscultation b/l with normal respiratory effort HEART: regular rate & rhythm ABDOMEN:  normoactive bowel sounds , non tender, not distended. No palpable hepatosplenomegaly.  Extremity: no pedal edema PSYCH: alert & oriented x 3 with fluent speech NEURO: no focal motor/sensory deficits   LABORATORY DATA:  I have reviewed the data as listed  . CBC Latest Ref Rng & Units 05/02/2019 04/25/2019 04/11/2019  WBC 4.0 - 10.5 K/uL 4.1 5.6 2.9(L)  Hemoglobin 12.0 - 15.0 g/dL 10.6(L) 10.6(L) 10.1(L)  Hematocrit 36.0 - 46.0 % 31.8(L) 33.0(L) 31.8(L)  Platelets 150 - 400 K/uL 155 307 217    . CMP Latest Ref Rng & Units 05/02/2019 04/25/2019 04/11/2019  Glucose 70 - 99 mg/dL 104(H) 102(H) 100(H)  BUN 8 - 23 mg/dL '14 14 15  '$ Creatinine 0.44 - 1.00 mg/dL 0.65 0.64 0.64  Sodium 135 - 145  mmol/L 137 139 136  Potassium 3.5 - 5.1 mmol/L 4.1 4.2 4.3  Chloride 98 - 111 mmol/L 101 105 100  CO2 22 - 32 mmol/L '28 27 28  '$ Calcium 8.9 - 10.3 mg/dL 9.1 9.0 8.8(L)  Total Protein 6.5 - 8.1 g/dL 6.1(L) 6.2(L) 6.2(L)  Total Bilirubin 0.3 - 1.2 mg/dL 0.3 0.4 0.3  Alkaline Phos 38 - 126 U/L 34(L) 36(L) 38  AST 15 - 41 U/L '16 16 20  '$ ALT 0 - 44 U/L '19 19 24   '$ Most recent MMP 04/25/2019 Component     Latest Ref Rng & Units 04/25/2019  IgG (Immunoglobin G), Serum     586 - 1,602 mg/dL 702  IgA     64 - 422 mg/dL 39 (L)  IgM (Immunoglobulin M), Srm     26 - 217 mg/dL 26  Total Protein ELP     6.0 - 8.5 g/dL 5.9 (L)  Albumin SerPl Elph-Mcnc     2.9 - 4.4 g/dL 3.8  Alpha 1     0.0 - 0.4 g/dL 0.2  Alpha2 Glob SerPl  Elph-Mcnc     0.4 - 1.0 g/dL 0.7  B-Globulin SerPl Elph-Mcnc     0.7 - 1.3 g/dL 1.0  Gamma Glob SerPl Elph-Mcnc     0.4 - 1.8 g/dL 0.3 (L)  M Protein SerPl Elph-Mcnc     Not Observed g/dL 0.4 (H)  Globulin, Total     2.2 - 3.9 g/dL 2.1 (L)  Albumin/Glob SerPl     0.7 - 1.7 1.9 (H)  IFE 1      Comment  Please Note (HCV):      Comment    12/01/18 BM Bx:   12/01/18 Flow Cytometry:       RADIOGRAPHIC STUDIES: I have personally reviewed the radiological images as listed and agreed with the findings in the report. No results found.   ASSESSMENT & PLAN:  78 y.o. female with  1. Multiple Myeloma with IgG Lambda specificity Labs upon initial presentation from 12/01/18 CBC w/diff revealed HGB at 10.4 with MCV of 105.7. 11/28/18 CMP revealed Creatinine normal at 0.68 and Calcium normal at 8.9. 11/28/18 Beta 2 microglobulin at 2.'6mg'$  (also a reading at 4.'7mg'$  on the same day). 11/30/18 24HR UPEP revealed M spike at 75.1%. 11/28/18 SPEP revealed M spike of 4.6g. 11/28/18 Immunoglobulins revealed IgG elevated at '6181mg'$ .  12/01/18 BM Bx revealed hypercellular bone marrow with 70-80% CD138 immunohistochemistry (44% aspirate) lambda-restricted plasma cells as well as a kappa restricted population of B-cells 11/27/18 MRI Thoracic Spine which revealed Multifocal marrow signal abnormality consistent with metastatic disease or multiple myeloma. The patient has multiple compression fractures most consistent with pathologic injuries. Extensive marrow signal abnormality makes determining age of the fractures difficult but edema is most intense in T3. 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 2. Areas of hypermetabolic disease identified in both lungs suggesting multiple myeloma involvement. 3. 1.9 cm calcified left thyroid nodule is hypermetabolic, but Indeterminate. 4. Colon is diffusely distended with gas and stool. Imaging features would be compatible with clinical constipation. 5.  Aortic Atherosclerois.  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 She does not currently have elevated lymphocyte counts on peripheral blood draws.   PLAN: -Discussed pt labwork today, 05/16/2019; blood counts are stable, blood chemistries are stable, MMP is pending -The pt has no prohibitive toxicities from continuing C8D1 dexamethazone, bortezomib, and denosumab at this time. -M spike is down to 0.2g/dl showing continued improvement and a 90% reduction -Discuss the patient's next steps once M protein plateaus: transplant vs switching to maintenance treatment. Discussed the pros and cons of each option. Pt would like to talk to her husband about the transplant before making a final decision. -Discussed that maintenance treatment would usually consist of Revlimid, but because of the patient's rashes, will consider Velcade without steroids or Ninlaro instead -Recommend continuing to stay as physically active as possible -Repeat PET and BM Bx in the next few months before moving to maintenance treatment, if that is what the patient chooses. -Will see the pt back in 3 weeks   Please schedule next 2 cycles of treatment per orders Labs D1 and D8 MD visits on D1 of each cycle of treatment   All of the patients  questions were answered with apparent satisfaction. The patient knows to call the clinic with any problems, questions or concerns.  The total time spent in the appt was 25 minutes and more than 50% was on counseling and direct patient cares.   Sullivan Lone MD MS AAHIVMS [facility] Hematology/Oncology Physician [facility]  (Office):       [phone] (Work cell):  [phone] (Fax):           [phone]  05/15/2019 2:24 PM  I, De Burrs, am acting as a scribe for Dr. Irene Limbo  .I have reviewed the above documentation for accuracy and completeness, and I agree with the above. [name] MD

## 2019-05-16 ENCOUNTER — Inpatient Hospital Stay: Payer: Medicare HMO

## 2019-05-16 ENCOUNTER — Inpatient Hospital Stay (HOSPITAL_BASED_OUTPATIENT_CLINIC_OR_DEPARTMENT_OTHER): Payer: Medicare HMO | Admitting: Hematology

## 2019-05-16 ENCOUNTER — Inpatient Hospital Stay: Payer: Medicare HMO | Attending: Hematology

## 2019-05-16 ENCOUNTER — Other Ambulatory Visit: Payer: Self-pay

## 2019-05-16 VITALS — BP 159/84 | HR 71 | Temp 98.5°F | Resp 18 | Ht 62.5 in | Wt 102.5 lb

## 2019-05-16 DIAGNOSIS — K5900 Constipation, unspecified: Secondary | ICD-10-CM | POA: Diagnosis not present

## 2019-05-16 DIAGNOSIS — M8580 Other specified disorders of bone density and structure, unspecified site: Secondary | ICD-10-CM | POA: Diagnosis not present

## 2019-05-16 DIAGNOSIS — M549 Dorsalgia, unspecified: Secondary | ICD-10-CM | POA: Diagnosis not present

## 2019-05-16 DIAGNOSIS — Z803 Family history of malignant neoplasm of breast: Secondary | ICD-10-CM | POA: Insufficient documentation

## 2019-05-16 DIAGNOSIS — I10 Essential (primary) hypertension: Secondary | ICD-10-CM | POA: Insufficient documentation

## 2019-05-16 DIAGNOSIS — Z801 Family history of malignant neoplasm of trachea, bronchus and lung: Secondary | ICD-10-CM | POA: Insufficient documentation

## 2019-05-16 DIAGNOSIS — Z7189 Other specified counseling: Secondary | ICD-10-CM

## 2019-05-16 DIAGNOSIS — C9000 Multiple myeloma not having achieved remission: Secondary | ICD-10-CM

## 2019-05-16 DIAGNOSIS — Z7982 Long term (current) use of aspirin: Secondary | ICD-10-CM | POA: Insufficient documentation

## 2019-05-16 DIAGNOSIS — Z5111 Encounter for antineoplastic chemotherapy: Secondary | ICD-10-CM

## 2019-05-16 DIAGNOSIS — D72820 Lymphocytosis (symptomatic): Secondary | ICD-10-CM | POA: Diagnosis not present

## 2019-05-16 DIAGNOSIS — Z87891 Personal history of nicotine dependence: Secondary | ICD-10-CM | POA: Insufficient documentation

## 2019-05-16 DIAGNOSIS — Z79899 Other long term (current) drug therapy: Secondary | ICD-10-CM | POA: Diagnosis not present

## 2019-05-16 DIAGNOSIS — H409 Unspecified glaucoma: Secondary | ICD-10-CM | POA: Diagnosis not present

## 2019-05-16 DIAGNOSIS — G893 Neoplasm related pain (acute) (chronic): Secondary | ICD-10-CM

## 2019-05-16 LAB — CMP (CANCER CENTER ONLY)
ALT: 18 U/L (ref 0–44)
AST: 15 U/L (ref 15–41)
Albumin: 3.8 g/dL (ref 3.5–5.0)
Alkaline Phosphatase: 33 U/L — ABNORMAL LOW (ref 38–126)
Anion gap: 10 (ref 5–15)
BUN: 15 mg/dL (ref 8–23)
CO2: 27 mmol/L (ref 22–32)
Calcium: 9 mg/dL (ref 8.9–10.3)
Chloride: 101 mmol/L (ref 98–111)
Creatinine: 0.47 mg/dL (ref 0.44–1.00)
GFR, Est AFR Am: >60 mL/min (ref >60)
GFR, Estimated: >60 mL/min (ref >60)
Glucose, Bld: 112 mg/dL — ABNORMAL HIGH (ref 70–99)
Potassium: 4.2 mmol/L (ref 3.5–5.1)
Sodium: 138 mmol/L (ref 135–145)
Total Bilirubin: 0.6 mg/dL (ref 0.3–1.2)
Total Protein: 6.1 g/dL — ABNORMAL LOW (ref 6.5–8.1)

## 2019-05-16 LAB — CBC WITH DIFFERENTIAL/PLATELET
Abs Immature Granulocytes: 0.02 10*3/uL (ref 0.00–0.07)
Basophils Absolute: 0 10*3/uL (ref 0.0–0.1)
Basophils Relative: 1 %
Eosinophils Absolute: 0.1 10*3/uL (ref 0.0–0.5)
Eosinophils Relative: 1 %
HCT: 31 % — ABNORMAL LOW (ref 36.0–46.0)
Hemoglobin: 10.2 g/dL — ABNORMAL LOW (ref 12.0–15.0)
Immature Granulocytes: 1 %
Lymphocytes Relative: 4 %
Lymphs Abs: 0.2 10*3/uL — ABNORMAL LOW (ref 0.7–4.0)
MCH: 32.6 pg (ref 26.0–34.0)
MCHC: 32.9 g/dL (ref 30.0–36.0)
MCV: 99 fL (ref 80.0–100.0)
Monocytes Absolute: 0.2 10*3/uL (ref 0.1–1.0)
Monocytes Relative: 6 %
Neutro Abs: 3.7 10*3/uL (ref 1.7–7.7)
Neutrophils Relative %: 87 %
Platelets: 336 10*3/uL (ref 150–400)
RBC: 3.13 MIL/uL — ABNORMAL LOW (ref 3.87–5.11)
RDW: 16.7 % — ABNORMAL HIGH (ref 11.5–15.5)
WBC: 4.2 10*3/uL (ref 4.0–10.5)
nRBC: 0 % (ref 0.0–0.2)

## 2019-05-16 MED ORDER — PROCHLORPERAZINE MALEATE 10 MG PO TABS
10.0000 mg | ORAL_TABLET | Freq: Once | ORAL | Status: AC
  Administered 2019-05-16: 10 mg via ORAL

## 2019-05-16 MED ORDER — PROCHLORPERAZINE MALEATE 10 MG PO TABS
ORAL_TABLET | ORAL | Status: AC
  Filled 2019-05-16: qty 1

## 2019-05-16 MED ORDER — DEXAMETHASONE 4 MG PO TABS
20.0000 mg | ORAL_TABLET | Freq: Once | ORAL | Status: AC
  Administered 2019-05-16: 10:00:00 20 mg via ORAL

## 2019-05-16 MED ORDER — FENTANYL 25 MCG/HR TD PT72: 1.0000 | MEDICATED_PATCH | TRANSDERMAL | 0 refills | Status: DC

## 2019-05-16 MED ORDER — BORTEZOMIB CHEMO SQ INJECTION 3.5 MG (2.5MG/ML)
1.3000 mg/m2 | Freq: Once | INTRAMUSCULAR | Status: AC
  Administered 2019-05-16: 10:00:00 2 mg via SUBCUTANEOUS
  Filled 2019-05-16: qty 0.8

## 2019-05-16 MED ORDER — DEXAMETHASONE 4 MG PO TABS
ORAL_TABLET | ORAL | Status: AC
  Filled 2019-05-16: qty 5

## 2019-05-16 NOTE — Patient Instructions (Signed)
[facility] Discharge Instructions for Patients Receiving Chemotherapy  Today you received the following chemotherapy agents Velcade  To help prevent nausea and vomiting after your treatment, we encourage you to take your nausea medication as directed by your MD.   If you develop nausea and vomiting that is not controlled by your nausea medication, call the clinic.   BELOW ARE SYMPTOMS THAT SHOULD BE REPORTED IMMEDIATELY:  *FEVER GREATER THAN 100.5 F  *CHILLS WITH OR WITHOUT FEVER  NAUSEA AND VOMITING THAT IS NOT CONTROLLED WITH YOUR NAUSEA MEDICATION  *UNUSUAL SHORTNESS OF BREATH  *UNUSUAL BRUISING OR BLEEDING  TENDERNESS IN MOUTH AND THROAT WITH OR WITHOUT PRESENCE OF ULCERS  *URINARY PROBLEMS  *BOWEL PROBLEMS  UNUSUAL RASH Items with * indicate a potential emergency and should be followed up as soon as possible.  Feel free to call the clinic should you have any questions or concerns. The clinic phone number is [phone].  Please show the CHEMO ALERT CARD at check-in to the Emergency Department and triage nurse.  Coronavirus (COVID-19) Are you at risk?  Are you at risk for the Coronavirus (COVID-19)?  To be considered HIGH RISK for Coronavirus (COVID-19), you have to meet the following criteria:  . Traveled to [place], [place], [place], [place] or [place]; or in the [place] to [place], [place], [place], or [place]; and have fever, cough, and shortness of breath within the last 2 weeks of travel OR . Been in close contact with a person diagnosed with COVID-19 within the last 2 weeks and have fever, cough, and shortness of breath . IF YOU DO NOT MEET THESE CRITERIA, YOU ARE CONSIDERED LOW RISK FOR COVID-19.  What to do if you are HIGH RISK for COVID-19?  . If you are having a medical emergency, call 911. . Seek medical care right away. Before you go to a doctor's office, urgent care or emergency department, call ahead and tell them about  your recent travel, contact with someone diagnosed with COVID-19, and your symptoms. You should receive instructions from your physician's office regarding next steps of care.  . When you arrive at healthcare provider, tell the healthcare staff immediately you have returned from visiting [place], [place], [place], [place] or [place]; or traveled in the [place] to [place], [place], [place], or [place]; in the last two weeks or you have been in close contact with a person diagnosed with COVID-19 in the last 2 weeks.   . Tell the health care staff about your symptoms: fever, cough and shortness of breath. . After you have been seen by a medical provider, you will be either: o Tested for (COVID-19) and discharged home on quarantine except to seek medical care if symptoms worsen, and asked to  - Stay home and avoid contact with others until you get your results (4-5 days)  - Avoid travel on public transportation if possible (such as bus, train, or airplane) or o Sent to the Emergency Department by EMS for evaluation, COVID-19 testing, and possible admission depending on your condition and test results.  What to do if you are LOW RISK for COVID-19?  Reduce your risk of any infection by using the same precautions used for avoiding the common cold or flu:  . Wash your hands often with soap and warm water for at least 20 seconds.  If soap and water are not readily available, use an alcohol-based hand sanitizer with at least 60% alcohol.  . If   coughing or sneezing, cover your mouth and nose by coughing or sneezing into the elbow areas of your shirt or coat, into a tissue or into your sleeve (not your hands). . Avoid shaking hands with others and consider head nods or verbal greetings only. . Avoid touching your eyes, nose, or mouth with unwashed hands.  . Avoid close contact with people who are sick. . Avoid places or events with large numbers of people in one location, like concerts or sporting  events. . Carefully consider travel plans you have or are making. . If you are planning any travel outside or inside the [place], visit the CDC's Travelers' Health webpage for the latest health notices. . If you have some symptoms but not all symptoms, continue to monitor at home and seek medical attention if your symptoms worsen. . If you are having a medical emergency, call 911.   ADDITIONAL HEALTHCARE OPTIONS FOR PATIENTS  [facility] / e-Visit: [email or web address]         [facility]: [phone]  [facility]: [phone]                   [facility]: [phone]    

## 2019-05-17 ENCOUNTER — Telehealth: Payer: Self-pay | Admitting: Hematology

## 2019-05-17 LAB — MULTIPLE MYELOMA PANEL, SERUM
Albumin SerPl Elph-Mcnc: 3.5 g/dL (ref 2.9–4.4)
Albumin/Glob SerPl: 1.6 (ref 0.7–1.7)
Alpha 1: 0.3 g/dL (ref 0.0–0.4)
Alpha2 Glob SerPl Elph-Mcnc: 0.8 g/dL (ref 0.4–1.0)
B-Globulin SerPl Elph-Mcnc: 0.9 g/dL (ref 0.7–1.3)
Gamma Glob SerPl Elph-Mcnc: 0.2 g/dL — ABNORMAL LOW (ref 0.4–1.8)
Globulin, Total: 2.2 g/dL (ref 2.2–3.9)
IgA: 34 mg/dL — ABNORMAL LOW (ref 64–422)
IgG (Immunoglobin G), Serum: 586 mg/dL (ref 586–1602)
IgM (Immunoglobulin M), Srm: 12 mg/dL — ABNORMAL LOW (ref 26–217)
M Protein SerPl Elph-Mcnc: 0.2 g/dL — ABNORMAL HIGH
Total Protein ELP: 5.7 g/dL — ABNORMAL LOW (ref 6.0–8.5)

## 2019-05-17 NOTE — Telephone Encounter (Signed)
Per 8/11 los appts already scheduled.

## 2019-05-19 ENCOUNTER — Other Ambulatory Visit: Payer: Self-pay | Admitting: Hematology

## 2019-05-19 ENCOUNTER — Other Ambulatory Visit: Payer: Self-pay

## 2019-05-19 ENCOUNTER — Inpatient Hospital Stay: Payer: Medicare HMO

## 2019-05-19 VITALS — BP 170/87 | HR 70 | Temp 98.2°F | Resp 18 | Ht 62.5 in | Wt 102.0 lb

## 2019-05-19 DIAGNOSIS — Z7189 Other specified counseling: Secondary | ICD-10-CM

## 2019-05-19 DIAGNOSIS — C9000 Multiple myeloma not having achieved remission: Secondary | ICD-10-CM

## 2019-05-19 MED ORDER — PROCHLORPERAZINE MALEATE 10 MG PO TABS
10.0000 mg | ORAL_TABLET | Freq: Once | ORAL | Status: AC
  Administered 2019-05-19: 10 mg via ORAL

## 2019-05-19 MED ORDER — BORTEZOMIB CHEMO SQ INJECTION 3.5 MG (2.5MG/ML)
1.3000 mg/m2 | Freq: Once | INTRAMUSCULAR | Status: AC
  Administered 2019-05-19: 2 mg via SUBCUTANEOUS
  Filled 2019-05-19: qty 0.8

## 2019-05-19 MED ORDER — PROCHLORPERAZINE MALEATE 10 MG PO TABS
ORAL_TABLET | ORAL | Status: AC
  Filled 2019-05-19: qty 1

## 2019-05-23 ENCOUNTER — Inpatient Hospital Stay: Payer: Medicare HMO

## 2019-05-23 ENCOUNTER — Other Ambulatory Visit: Payer: Self-pay

## 2019-05-23 ENCOUNTER — Other Ambulatory Visit: Payer: Self-pay | Admitting: Hematology

## 2019-05-23 ENCOUNTER — Other Ambulatory Visit: Payer: Self-pay | Admitting: *Deleted

## 2019-05-23 VITALS — BP 153/86 | HR 73 | Temp 98.5°F | Resp 16 | Ht 62.5 in | Wt 102.0 lb

## 2019-05-23 DIAGNOSIS — Z5111 Encounter for antineoplastic chemotherapy: Secondary | ICD-10-CM

## 2019-05-23 DIAGNOSIS — C9000 Multiple myeloma not having achieved remission: Secondary | ICD-10-CM

## 2019-05-23 DIAGNOSIS — Z7189 Other specified counseling: Secondary | ICD-10-CM

## 2019-05-23 DIAGNOSIS — G893 Neoplasm related pain (acute) (chronic): Secondary | ICD-10-CM

## 2019-05-23 LAB — CBC WITH DIFFERENTIAL/PLATELET
Abs Immature Granulocytes: 0.02 10*3/uL (ref 0.00–0.07)
Basophils Absolute: 0 10*3/uL (ref 0.0–0.1)
Basophils Relative: 1 %
Eosinophils Absolute: 0.1 10*3/uL (ref 0.0–0.5)
Eosinophils Relative: 2 %
HCT: 31.9 % — ABNORMAL LOW (ref 36.0–46.0)
Hemoglobin: 10.5 g/dL — ABNORMAL LOW (ref 12.0–15.0)
Immature Granulocytes: 1 %
Lymphocytes Relative: 6 %
Lymphs Abs: 0.2 10*3/uL — ABNORMAL LOW (ref 0.7–4.0)
MCH: 32.9 pg (ref 26.0–34.0)
MCHC: 32.9 g/dL (ref 30.0–36.0)
MCV: 100 fL (ref 80.0–100.0)
Monocytes Absolute: 0.2 10*3/uL (ref 0.1–1.0)
Monocytes Relative: 6 %
Neutro Abs: 2.7 10*3/uL (ref 1.7–7.7)
Neutrophils Relative %: 84 %
Platelets: 241 10*3/uL (ref 150–400)
RBC: 3.19 MIL/uL — ABNORMAL LOW (ref 3.87–5.11)
RDW: 16.7 % — ABNORMAL HIGH (ref 11.5–15.5)
WBC: 3.1 10*3/uL — ABNORMAL LOW (ref 4.0–10.5)
nRBC: 0 % (ref 0.0–0.2)

## 2019-05-23 LAB — CMP (CANCER CENTER ONLY)
ALT: 18 U/L (ref 0–44)
AST: 14 U/L — ABNORMAL LOW (ref 15–41)
Albumin: 3.8 g/dL (ref 3.5–5.0)
Alkaline Phosphatase: 37 U/L — ABNORMAL LOW (ref 38–126)
Anion gap: 9 (ref 5–15)
BUN: 12 mg/dL (ref 8–23)
CO2: 28 mmol/L (ref 22–32)
Calcium: 8.9 mg/dL (ref 8.9–10.3)
Chloride: 103 mmol/L (ref 98–111)
Creatinine: 0.6 mg/dL (ref 0.44–1.00)
GFR, Est AFR Am: >60 mL/min (ref >60)
GFR, Estimated: >60 mL/min (ref >60)
Glucose, Bld: 96 mg/dL (ref 70–99)
Potassium: 4.3 mmol/L (ref 3.5–5.1)
Sodium: 140 mmol/L (ref 135–145)
Total Bilirubin: 0.3 mg/dL (ref 0.3–1.2)
Total Protein: 6.1 g/dL — ABNORMAL LOW (ref 6.5–8.1)

## 2019-05-23 MED ORDER — DENOSUMAB 120 MG/1.7ML ~~LOC~~ SOLN
120.0000 mg | Freq: Once | SUBCUTANEOUS | Status: AC
  Administered 2019-05-23: 10:00:00 120 mg via SUBCUTANEOUS

## 2019-05-23 MED ORDER — DEXAMETHASONE 4 MG PO TABS
20.0000 mg | ORAL_TABLET | Freq: Once | ORAL | Status: AC
  Administered 2019-05-23: 10:00:00 20 mg via ORAL

## 2019-05-23 MED ORDER — BORTEZOMIB CHEMO SQ INJECTION 3.5 MG (2.5MG/ML)
1.3000 mg/m2 | Freq: Once | INTRAMUSCULAR | Status: AC
  Administered 2019-05-23: 10:00:00 2 mg via SUBCUTANEOUS
  Filled 2019-05-23: qty 0.8

## 2019-05-23 MED ORDER — DENOSUMAB 120 MG/1.7ML ~~LOC~~ SOLN
SUBCUTANEOUS | Status: AC
  Filled 2019-05-23: qty 1.7

## 2019-05-23 MED ORDER — PROCHLORPERAZINE MALEATE 10 MG PO TABS
10.0000 mg | ORAL_TABLET | Freq: Once | ORAL | Status: AC
  Administered 2019-05-23: 10:00:00 10 mg via ORAL

## 2019-05-23 MED ORDER — OXYCODONE-ACETAMINOPHEN 5-325 MG PO TABS: 1.0000 | ORAL_TABLET | ORAL | 0 refills | Status: DC | PRN

## 2019-05-23 MED ORDER — DEXAMETHASONE 4 MG PO TABS
ORAL_TABLET | ORAL | Status: AC
  Filled 2019-05-23: qty 5

## 2019-05-23 MED ORDER — PROCHLORPERAZINE MALEATE 10 MG PO TABS
ORAL_TABLET | ORAL | Status: AC
  Filled 2019-05-23: qty 1

## 2019-05-23 NOTE — Telephone Encounter (Signed)
Running low on percocet - will need refill by end of week.

## 2019-05-23 NOTE — Patient Instructions (Signed)
[facility] Discharge Instructions for Patients Receiving Chemotherapy  Today you received the following chemotherapy agents: Velcade.  To help prevent nausea and vomiting after your treatment, we encourage you to take your nausea medication as directed.   If you develop nausea and vomiting that is not controlled by your nausea medication, call the clinic.   BELOW ARE SYMPTOMS THAT SHOULD BE REPORTED IMMEDIATELY:  *FEVER GREATER THAN 100.5 F  *CHILLS WITH OR WITHOUT FEVER  NAUSEA AND VOMITING THAT IS NOT CONTROLLED WITH YOUR NAUSEA MEDICATION  *UNUSUAL SHORTNESS OF BREATH  *UNUSUAL BRUISING OR BLEEDING  TENDERNESS IN MOUTH AND THROAT WITH OR WITHOUT PRESENCE OF ULCERS  *URINARY PROBLEMS  *BOWEL PROBLEMS  UNUSUAL RASH Items with * indicate a potential emergency and should be followed up as soon as possible.  Feel free to call the clinic should you have any questions or concerns. The clinic phone number is [phone].  Please show the [facility] at check-in to the Emergency Department and triage nurse.  Denosumab injection (xgeva) What is this medicine? DENOSUMAB (den oh sue mab) slows bone breakdown. Prolia is used to treat osteoporosis in women after menopause and in men, and in people who are taking corticosteroids for 6 months or more. Delton See is used to treat a high calcium level due to cancer and to prevent bone fractures and other bone problems caused by multiple myeloma or cancer bone metastases. Delton See is also used to treat giant cell tumor of the bone. This medicine may be used for other purposes; ask your health care provider or pharmacist if you have questions. COMMON BRAND NAME(S): Prolia, XGEVA What should I tell my health care provider before I take this medicine? They need to know if you have any of these conditions:  dental disease  having surgery or tooth extraction  infection  kidney disease  low levels of calcium or Vitamin D in  the blood  malnutrition  on hemodialysis  skin conditions or sensitivity  thyroid or parathyroid disease  an unusual reaction to denosumab, other medicines, foods, dyes, or preservatives  pregnant or trying to get pregnant  breast-feeding How should I use this medicine? This medicine is for injection under the skin. It is given by a health care professional in a hospital or clinic setting. A special MedGuide will be given to you before each treatment. Be sure to read this information carefully each time. For Prolia, talk to your pediatrician regarding the use of this medicine in children. Special care may be needed. For Delton See, talk to your pediatrician regarding the use of this medicine in children. While this drug may be prescribed for children as young as 13 years for selected conditions, precautions do apply. Overdosage: If you think you have taken too much of this medicine contact a poison control center or emergency room at once. NOTE: This medicine is only for you. Do not share this medicine with others. What if I miss a dose? It is important not to miss your dose. Call your doctor or health care professional if you are unable to keep an appointment. What may interact with this medicine? Do not take this medicine with any of the following medications:  other medicines containing denosumab This medicine may also interact with the following medications:  medicines that lower your chance of fighting infection  steroid medicines like prednisone or cortisone This list may not describe all possible interactions. Give your health care provider a list of all the medicines, herbs,  non-prescription drugs, or dietary supplements you use. Also tell them if you smoke, drink alcohol, or use illegal drugs. Some items may interact with your medicine. What should I watch for while using this medicine? Visit your doctor or health care professional for regular checks on your progress. Your  doctor or health care professional may order blood tests and other tests to see how you are doing. Call your doctor or health care professional for advice if you get a fever, chills or sore throat, or other symptoms of a cold or flu. Do not treat yourself. This drug may decrease your body's ability to fight infection. Try to avoid being around people who are sick. You should make sure you get enough calcium and vitamin D while you are taking this medicine, unless your doctor tells you not to. Discuss the foods you eat and the vitamins you take with your health care professional. See your dentist regularly. Brush and floss your teeth as directed. Before you have any dental work done, tell your dentist you are receiving this medicine. Do not become pregnant while taking this medicine or for 5 months after stopping it. Talk with your doctor or health care professional about your birth control options while taking this medicine. Women should inform their doctor if they wish to become pregnant or think they might be pregnant. There is a potential for serious side effects to an unborn child. Talk to your health care professional or pharmacist for more information. What side effects may I notice from receiving this medicine? Side effects that you should report to your doctor or health care professional as soon as possible:  allergic reactions like skin rash, itching or hives, swelling of the face, lips, or tongue  bone pain  breathing problems  dizziness  jaw pain, especially after dental work  redness, blistering, peeling of the skin  signs and symptoms of infection like fever or chills; cough; sore throat; pain or trouble passing urine  signs of low calcium like fast heartbeat, muscle cramps or muscle pain; pain, tingling, numbness in the hands or feet; seizures  unusual bleeding or bruising  unusually weak or tired Side effects that usually do not require medical attention (report to your  doctor or health care professional if they continue or are bothersome):  constipation  diarrhea  headache  joint pain  loss of appetite  muscle pain  runny nose  tiredness  upset stomach This list may not describe all possible side effects. Call your doctor for medical advice about side effects. You may report side effects to FDA at 1-800-FDA-1088. Where should I keep my medicine? This medicine is only given in a clinic, doctor's office, or other health care setting and will not be stored at home. NOTE: This sheet is a summary. It may not cover all possible information. If you have questions about this medicine, talk to your doctor, pharmacist, or health care provider.  2020 [employer] Standard (2018-01-28 16:10:44)

## 2019-05-25 DIAGNOSIS — R69 Illness, unspecified: Secondary | ICD-10-CM | POA: Diagnosis not present

## 2019-05-25 MED FILL — CYCLOPHOSPHAMIDE 50 MG CAP: 50 | 28 days supply | Qty: 36 | Fill #0

## 2019-05-26 ENCOUNTER — Inpatient Hospital Stay: Payer: Medicare HMO

## 2019-05-26 ENCOUNTER — Other Ambulatory Visit: Payer: Self-pay

## 2019-05-26 VITALS — BP 170/90 | HR 71 | Temp 98.2°F | Resp 16

## 2019-05-26 DIAGNOSIS — Z7189 Other specified counseling: Secondary | ICD-10-CM

## 2019-05-26 DIAGNOSIS — C9000 Multiple myeloma not having achieved remission: Secondary | ICD-10-CM | POA: Diagnosis not present

## 2019-05-26 MED ORDER — PROCHLORPERAZINE MALEATE 10 MG PO TABS
10.0000 mg | ORAL_TABLET | Freq: Once | ORAL | Status: AC
  Administered 2019-05-26: 10:00:00 10 mg via ORAL

## 2019-05-26 MED ORDER — BORTEZOMIB CHEMO SQ INJECTION 3.5 MG (2.5MG/ML)
1.3000 mg/m2 | Freq: Once | INTRAMUSCULAR | Status: AC
  Administered 2019-05-26: 2 mg via SUBCUTANEOUS
  Filled 2019-05-26: qty 0.8

## 2019-05-26 NOTE — Patient Instructions (Signed)
[facility] Discharge Instructions for Patients Receiving Chemotherapy  Today you received the following chemotherapy agents Velcade.  To help prevent nausea and vomiting after your treatment, we encourage you to take your nausea medication as directed.  If you develop nausea and vomiting that is not controlled by your nausea medication, call the clinic.   BELOW ARE SYMPTOMS THAT SHOULD BE REPORTED IMMEDIATELY:  *FEVER GREATER THAN 100.5 F  *CHILLS WITH OR WITHOUT FEVER  NAUSEA AND VOMITING THAT IS NOT CONTROLLED WITH YOUR NAUSEA MEDICATION  *UNUSUAL SHORTNESS OF BREATH  *UNUSUAL BRUISING OR BLEEDING  TENDERNESS IN MOUTH AND THROAT WITH OR WITHOUT PRESENCE OF ULCERS  *URINARY PROBLEMS  *BOWEL PROBLEMS  UNUSUAL RASH Items with * indicate a potential emergency and should be followed up as soon as possible.  Feel free to call the clinic should you have any questions or concerns. The clinic phone number is [phone].  Please show the CHEMO ALERT CARD at check-in to the Emergency Department and triage nurse.   

## 2019-05-30 ENCOUNTER — Ambulatory Visit: Payer: Medicare HMO

## 2019-06-05 ENCOUNTER — Telehealth: Payer: Self-pay | Admitting: Hematology

## 2019-06-05 NOTE — Telephone Encounter (Signed)
Called regarding infusion log, informed patient 09/08 appointment time has been changed. Left a voicemail.

## 2019-06-05 NOTE — Progress Notes (Signed)
HEMATOLOGY/ONCOLOGY CLINIC NOTE  Date of Service: 06/06/2019  Patient Care Team: [name], MD as PCP - General (Family Medicine) [name], MD as Consulting Physician (Hematology) Bond, [name], MD as Referring Physician (Ophthalmology)  Dr. Ronney Lion as [place] Specialist  [phone]  CHIEF COMPLAINTS/PURPOSE OF CONSULTATION:   Multiple Myeloma- continued management  HISTORY OF PRESENTING ILLNESS:   Megan Olson is a wonderful 78 y.o. female who has been referred to [place] by Dr. 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 She is accompanied today by her husband. The pt reports that she is doing well overall.   The pt notes that she was doing extensive yard work in October 2019, and began to feel back pain a few days after this, which she attributed to muscle pain. She recalls taking a deep breath, and developing sudden acute pain, and presented to care with her PCP's office. She began exercises for her back pain, without relief, then began PT without relief, then was referred to Dr. Paulla Fore in sports medicine in late January 2020. She had an XR which revealed a compression fracture at T11, then subsequently had an MRI, and a bone marrow biopsy. The pt notes that her back pain "seemed to move around." She endorses pain "up the whole left side" of her back.  The pt notes that she is not able to stand up straight due to her back pain, which she feels limits her ability to take a deep breath, and endorses pain exacerbation when she does take a deep breath. The pt needs assistance from sitting to lying from her husband. She is using about 3 Percocet a day.  The pt notes worsened constipation since beginning Percocet, and notes that her most recent laxative order was not covered by her insurance. She is using prune juice and milk of magnesia. She took Senokot S BID without relief.  The pt reports that  prior to her recent back pain, she had very few medical concerns. She endorses controlled BP, and has been monitored for DM but after closely watching her diet her A1C decreased to 5.8. She has glaucoma, and has had surgery in both eyes. Right eye with stent. The pt sees Dr. Edilia Bo at [facility] for her [facility] care. The pt notes that her vision has been recently "pretty good." The pt notes that she has been advised to limit treatment with steroids for her glaucoma. She denies heart or lung problems. Denies previous back problems. Last DEXA scan 3 years ago, and endorses osteopenia. She notes that she took Fosamax for 3-4 years, and stopped 5-6 years ago. She takes Vitamin D, a multivitamin, and magnesium.  The pt notes that she quit smoking cigarettes when she was 27, started when she was about 20. The pt consumes alcohol rarely, but not since beginning narcotics. 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  Of note prior to the patient's visit today, pt has had an MRI Thoracic Spine completed on 11/27/18 with results revealing Multifocal marrow signal abnormality consistent with metastatic disease or multiple myeloma. The patient has multiple compression fractures most consistent with pathologic injuries. Extensive marrow signal abnormality makes determining age of the fractures difficult but edema is most intense in T3. 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  Most recent lab results (12/01/18) of CBC w/diff is as follows: all  values are WNL except for RBC at 3.17, HGB at 10.4, HCT at 33.5, MCV at 105.7. 11/28/18 CMP revealed all values WNL except for Glucose at 105, Total Protein at 10.2, Albumin at 3.1 11/30/18 24HR UPEP revealed all values WNL except for M-spike at 75.1%. 11/28/18 Bega-2 microglobulin slightly elevated at 2.6 11/28/18 SPEP revealed M spike at 4.6g 11/28/18 Immunoglobulins revealed IgG at 6181, IgA at 24,  IgM at 16, and IgE at 6.  On review of systems, pt reports constipation, back pain, stable energy levels, ankle swelling, tenderness at T3, lower back pain, and denies abdominal pains, neck pain, and any other symptoms.   On PMHx the pt reports redundant colon, single hemorrhoid, glaucoma, HTN, osteopenia, Tonsillectomy, right eye stent and multiple eye surgeries. On Social Hx the pt reports rare alcohol use, smoked cigarettes between ages 60 and 54. 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 On Family Hx the pt reports sister died from small cell lung cancer and was a lifelong smoker, brother's daughter died of breast cancer with BRCA1 and BRCA2 mutations. Cousin with female breast cancer.   INTERVAL HISTORY:   Megan Olson returns today for management and evaluation of her multiple myeloma and C9D1 of dexamethazone, bortezomib, and denosumab at this time. The patient's last visit with [place] was on 05/16/2019. The pt reports that she is doing well overall.  The pt reports that she has been more active and walking around her block. She has also been hydrating herself properly.  She has been eating well and her energy levels are good but she has not been able to gain weight. She is going to see her Ophthalmologist in a month. She has been taking 6 Oxycodone per day. Two at night, two in the morning and about two throughout the day. She has cut back to 1.5 before bed. Her pain is the worst right before bed. Her pain level is at a 7-8 before she has to take one and is at 3-4 at it's best. Pt is concerned about the daily labs necessary after a transplant. Pt is not ready to pursue a transplant at this time.   Lab results today (06/06/19) of CBC w/diff and CMP is as follows: all values are WNL except for WBC at 3.7K, RBC at 3.09, Hgb at 10.3, HCT at 31.6, MCV at 102.3, RDW at 16.8, Lymphs Abs at 0.1K, Monocytes ABS at 0.0K, Glucose at 133, Total Protein at 6.4. 06/06/2019 MMP is in progress.   On review of systems,  pt denies abdominal pain and any other symptoms.    MEDICAL HISTORY:  Past Medical History:  Diagnosis Date  . Cancer ([place])    multiple myeloma  . Glaucoma   . HYPERTENSION 03/11/2007  . OSTEOPENIA 03/11/2007    SURGICAL HISTORY: Past Surgical History:  Procedure Laterality Date  . BREAST EXCISIONAL BIOPSY Right 2000  . BREAST LUMPECTOMY  1990   benign  . DILATION AND CURETTAGE OF UTERUS     bleeding at menopause. No uterine cancer  . IR RADIOLOGIST EVAL & MGMT  12/13/2018  . TONSILLECTOMY     age 66    SOCIAL HISTORY: Social History   Socioeconomic History  . Marital status: Married    Spouse name: Not on file  . Number of children: Not on file  . Years of education: Not on file  . Highest education level: Not on file  Occupational History  . Not on file  Social Needs  . Financial resource strain: Not on  file  . Food insecurity    Worry: Not on file    Inability: Not on file  . Transportation needs    Medical: Not on file    Non-medical: Not on file  Tobacco Use  . Smoking status: Former Smoker    Packs/day: 0.50    Years: 7.00    Pack years: 3.50    Types: Cigarettes    Quit date: 01/04/1961    Years since quitting: 58.4  . Smokeless tobacco: Never Used  Substance and Sexual Activity  . Alcohol use: Yes    Alcohol/week: 1.0 standard drinks    Types: 1 Standard drinks or equivalent per week  . Drug use: No  . Sexual activity: Not Currently  Lifestyle  . Physical activity    Days per week: Not on file    Minutes per session: Not on file  . Stress: Not on file  Relationships  . Social Herbalist on phone: Not on file    Gets together: Not on file    Attends religious service: Not on file    Active member of club or organization: Not on file    Attends meetings of clubs or organizations: Not on file    Relationship status: Not on file  . Intimate partner violence    Fear of current or ex partner: Not on file    Emotionally abused: Not on  file    Physically abused: Not on file    Forced sexual activity: Not on file  Other Topics Concern  . Not on file  Social History Narrative   Married. Lives with husband (patient of Dr. Yong Channel). 1 son. No grandkids. 1 granddog.       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 Heart disease Mother        CHF mother died 10  . Arthritis Mother   . Glaucoma Mother        sister as well  . Alcohol abuse Father   . Suicidality Father   . Cancer Sister        lung cancer, smoker  . Heart disease Sister        aortic valve replacement  . Hyperlipidemia Brother   . Hypertension Brother   . COPD Brother   . Arthritis Sister   . Hypertension Sister   . Glaucoma Sister   . Hashimoto's thyroiditis Sister   . Hypertension Son   . Stroke Maternal Grandmother   . Colon cancer Neg Hx   . Colon polyps Neg Hx     ALLERGIES:  is allergic to ace inhibitors; benadryl [diphenhydramine]; diamox [acetazolamide]; sulfamethoxazole; lenalidomide; and penicillins.   MEDICATIONS:  Current Outpatient Medications  Medication Sig Dispense Refill  . acyclovir (ZOVIRAX) 400 MG tablet Take 1 tablet (400 mg total) by mouth 2 (two) times daily. 60 tablet 3  . ALPHAGAN P 0.1 % SOLN Apply 1 drop topically See admin instructions. Apply 2 drops in right eye 3 times a day    . amLODipine (NORVASC) 5 MG tablet Take 1 tablet (5 mg total) by mouth daily. 90 tablet 3  . aspirin EC 81 MG tablet Take 81 mg by mouth daily.    . bimatoprost (LUMIGAN) 0.03 % ophthalmic solution Place 1 drop into the  right eye at bedtime.     . Cholecalciferol (VITAMIN D3) 1000 UNITS CAPS Take 1,000 Units by mouth 2 (two) times daily.     Marland Kitchen co-enzyme Q-10 50 MG capsule Take 50 mg by mouth daily.      . cyclophosphamide (CYTOXAN) 50 MG capsule TAKE 9  CAPSULES (450 MG TOTAL) BY MOUTH ONCE A WEEK. TAKE WITH FOOD TO MINIMIZE GI UPSET. TAKE EARLY IN THE DAY AND MAINTAIN HYDRATION. 36 capsule 2  . dexamethasone (DECADRON) 4 MG tablet 78m (5 tabs) on Day 15 of each cycle of treatment 15 tablet 3  . dorzolamide-timolol (COSOPT) 22.3-6.8 MG/ML ophthalmic solution Place 2 drops into both eyes 2 (two) times daily.   11  . fentaNYL (DURAGESIC) 25 MCG/HR Place 1 patch onto the skin every 3 (three) days. 10 patch 0  . hydrOXYzine (ATARAX/VISTARIL) 25 MG tablet Take 1 tablet (25 mg total) by mouth 3 (three) times daily as needed. 30 tablet 0  . magnesium hydroxide (MILK OF MAGNESIA) 400 MG/5ML suspension Take 15 mLs by mouth daily as needed for mild constipation or moderate constipation. 355 mL 0  . Magnesium Oxide 500 MG TABS Take 1 tablet by mouth daily.      . Multiple Vitamins-Minerals (MULTIVITAMIN WITH MINERALS) tablet Take 1 tablet by mouth daily.      . ondansetron (ZOFRAN) 8 MG tablet Take 1 tablet (8 mg total) by mouth 2 (two) times daily as needed (Nausea or vomiting). 30 tablet 1  . oxyCODONE-acetaminophen (PERCOCET) 5-325 MG tablet Take 1-2 tablets by mouth every 4 (four) hours as needed for moderate pain or severe pain. 90 tablet 0  . polyethylene glycol (MIRALAX) packet Take 17 g by mouth daily. 30 each 1  . prochlorperazine (COMPAZINE) 10 MG tablet Take 1 tablet (10 mg total) by mouth every 6 (six) hours as needed (Nausea or vomiting). 30 tablet 1  . senna-docusate (SENOKOT-S) 8.6-50 MG tablet Take 2 tablets by mouth at bedtime. 60 tablet 2  . vitamin C (ASCORBIC ACID) 500 MG tablet Take 500 mg by mouth daily.     No current facility-administered medications for this visit.    Facility-Administered Medications Ordered in Other Visits  Medication Dose Route Frequency Provider Last Rate Last Dose  . bortezomib SQ (VELCADE) chemo injection 2 mg  1.3 mg/m2 (Treatment Plan Recorded) Subcutaneous Once KBrunetta Genera MD        REVIEW OF  SYSTEMS:    A 10+ POINT REVIEW OF SYSTEMS WAS OBTAINED including neurology, dermatology, psychiatry, cardiac, respiratory, lymph, extremities, GI, GU, Musculoskeletal, constitutional, breasts, reproductive, HEENT.  All pertinent positives are noted in the HPI.  All others are negative.    PHYSICAL EXAMINATION: ECOG PERFORMANCE STATUS: 1-2  Vitals:   06/06/19 0951  BP: (!) 158/75  Pulse: 73  Resp: 18  Temp: 99.1 F (37.3 C)  SpO2: 100%   Filed Weights   06/06/19 0951  Weight: 100 lb 9.6 oz (45.6 kg)   Body mass index is 18.11 kg/m.   Exam given in a wheelchair   GENERAL:alert, in no acute distress and comfortable SKIN: no acute rashes, no significant lesions EYES: conjunctiva are pink and non-injected, sclera anicteric OROPHARYNX: MMM, no exudates, no oropharyngeal erythema or ulceration NECK: supple, no JVD LYMPH:  no palpable lymphadenopathy in the cervical, axillary or inguinal regions LUNGS: clear to auscultation b/l with normal respiratory effort HEART: regular rate & rhythm ABDOMEN:  normoactive bowel sounds , non tender, not distended. No palpable hepatosplenomegaly.  Extremity: no pedal edema PSYCH: alert & oriented x 3 with fluent speech NEURO: no focal motor/sensory deficits  LABORATORY DATA:  I have reviewed the data as listed  . CBC Latest Ref Rng & Units 06/06/2019 05/23/2019 05/16/2019  WBC 4.0 - 10.5 K/uL 3.7(L) 3.1(L) 4.2  Hemoglobin 12.0 - 15.0 g/dL 10.3(L) 10.5(L) 10.2(L)  Hematocrit 36.0 - 46.0 % 31.6(L) 31.9(L) 31.0(L)  Platelets 150 - 400 K/uL 385 241 336    . CMP Latest Ref Rng & Units 06/06/2019 05/23/2019 05/16/2019  Glucose 70 - 99 mg/dL 133(H) 96 112(H)  BUN 8 - 23 mg/dL _0 Creatinine 0.44 - 1.00 mg/dL 0.63 0.60 0.47  Sodium 135 - 145 mmol/L 137 140 138  Potassium 3.5 - 5.1 mmol/L 4.4 4.3 4.2  Chloride 98 - 111 mmol/L 104 103 101  CO2 22 - 32 mmol/L _1 Calcium 8.9 - 10.3 mg/dL 9.0 8.9 9.0  Total Protein 6.5 - 8.1 g/dL 6.4(L)  6.1(L) 6.1(L)  Total Bilirubin 0.3 - 1.2 mg/dL 0.5 0.3 0.6  Alkaline Phos 38 - 126 U/L 39 37(L) 33(L)  AST 15 - 41 U/L 15 14(L) 15  ALT 0 - 44 U/L _2 Most recent MMP 04/25/2019 Component     Latest Ref Rng & Units 04/25/2019  IgG (Immunoglobin G), Serum     586 - 1,602 mg/dL 702  IgA     64 - 422 mg/dL 39 (L)  IgM (Immunoglobulin M), Srm     26 - 217 mg/dL 26  Total Protein ELP     6.0 - 8.5 g/dL 5.9 (L)  Albumin SerPl Elph-Mcnc     2.9 - 4.4 g/dL 3.8  Alpha 1     0.0 - 0.4 g/dL 0.2  Alpha2 Glob SerPl Elph-Mcnc     0.4 - 1.0 g/dL 0.7  B-Globulin SerPl Elph-Mcnc     0.7 - 1.3 g/dL 1.0  Gamma Glob SerPl Elph-Mcnc     0.4 - 1.8 g/dL 0.3 (L)  M Protein SerPl Elph-Mcnc     Not Observed g/dL 0.4 (H)  Globulin, Total     2.2 - 3.9 g/dL 2.1 (L)  Albumin/Glob SerPl     0.7 - 1.7 1.9 (H)  IFE 1      Comment  Please Note (HCV):      Comment    12/01/18 BM Bx:   12/01/18 Flow Cytometry:       RADIOGRAPHIC STUDIES: I have personally reviewed the radiological images as listed and agreed with the findings in the report. No results found.   ASSESSMENT & PLAN:  78 y.o. female with  1. Multiple Myeloma with IgG Lambda specificity Labs upon initial presentation from 12/01/18 CBC w/diff revealed HGB at 10.4 with MCV of 105.7. 11/28/18 CMP revealed Creatinine normal at 0.68 and Calcium normal at 8.9. 11/28/18 Beta 2 microglobulin at 2.14m (also a reading at 4.734mon the same day). 11/30/18 24HR UPEP revealed M spike at 75.1%. 11/28/18 SPEP revealed M spike of 4.6g. 11/28/18 Immunoglobulins revealed IgG elevated at 618145m 12/01/18 BM Bx revealed hypercellular bone marrow with 70-80% CD138 immunohistochemistry (44% aspirate) lambda-restricted plasma cells as well as a kappa restricted population of B-cells 11/27/18 MRI Thoracic Spine which revealed Multifocal marrow signal abnormality consistent with metastatic disease or multiple myeloma. The patient has multiple compression  fractures most consistent with pathologic injuries. Extensive marrow signal abnormality makes determining age of the fractures difficult but edema is most intense in  T3. 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 2. Areas of hypermetabolic disease identified in both lungs suggesting multiple myeloma involvement. 3. 1.9 cm calcified left thyroid nodule is hypermetabolic, but Indeterminate. 4. Colon is diffusely distended with gas and stool. Imaging features would be compatible with clinical constipation. 5.  Aortic Atherosclerois.  Pt describes grade II to III rash on her re-challenge from Revlimid with optimized pre-medications, and we discontinued Revlimid  Began Cytoxan with C4 Velcade and Dexamethasone  04/25/2019 M spike is down to 0.2g/dl showing continued improvement and a 90% reduction  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 She does not currently have elevated lymphocyte counts on peripheral blood draws.   PLAN: -Discussed pt labwork today, 06/06/19; all values are WNL except for WBC at 3.7K, RBC at 3.09, Hgb at 10.3, HCT at 31.6, MCV at 102.3, RDW at 16.8, Lymphs Abs at 0.1K, Monocytes ABS at 0.0K, Glucose at 133, Total Protein at 6.4. 06/06/2019 MMP is in progress.  -Discussed 05/16/2019 MMP M-protein is down.  -Advised pt that if she continues to cut back on her pain medication we will try to get her off of her fentanyl patch.  -Advised pt to speak to the  the ordering physician about postponing her mammogram based on current medical priorities -Advised pt on proper pandemic procedures  -Will give pt her flu shot today  -Discussed that during her active induction treatment, getting the Shingrix vaccine may not get the best response. Would advise pt to pursue after 6 months on maintenance.  -The pt has no prohibitive toxicities from continuing C9D1 dexamethazone, bortezomib, and denosumab at this time. -Discuss the patient's next steps once M protein plateaus: transplant vs switching to maintenance treatment. Discussed the pros and cons of each option. Pt would like to talk to her husband about the transplant before making a final decision. -Repeat PET and BM Bx in the next few months before moving to maintenance treatment, if that is what the patient chooses. -Will see the pt back in 1 month  FOLLOW UP: F/u for remaining part of C9 and c10 as scheduled  The total time spent in the appt was 25 minutes and more than 50% was on counseling and direct patient cares.  All of the patient's questions were answered with apparent satisfaction. The patient knows to call the clinic with any problems, questions or concerns.   Sullivan Lone MD [place] AAHIVMS [facility] Hematology/Oncology Physician [facility]  (Office):       [phone] (Work cell):  [phone] (Fax):           [phone]  06/06/2019 10:47 AM  I, Yevette Edwards, am acting as a scribe for Dr. Sullivan Lone.   .I have reviewed the above documentation for accuracy and completeness, and I agree with the above. [name] MD

## 2019-06-06 ENCOUNTER — Inpatient Hospital Stay: Payer: Medicare HMO | Attending: Hematology

## 2019-06-06 ENCOUNTER — Telehealth: Payer: Self-pay | Admitting: Hematology

## 2019-06-06 ENCOUNTER — Inpatient Hospital Stay: Payer: Medicare HMO

## 2019-06-06 ENCOUNTER — Other Ambulatory Visit: Payer: Self-pay

## 2019-06-06 ENCOUNTER — Inpatient Hospital Stay (HOSPITAL_BASED_OUTPATIENT_CLINIC_OR_DEPARTMENT_OTHER): Payer: Medicare HMO | Admitting: Hematology

## 2019-06-06 VITALS — BP 158/75 | HR 73 | Temp 99.1°F | Resp 18 | Ht 62.5 in | Wt 100.6 lb

## 2019-06-06 DIAGNOSIS — G893 Neoplasm related pain (acute) (chronic): Secondary | ICD-10-CM | POA: Diagnosis not present

## 2019-06-06 DIAGNOSIS — H409 Unspecified glaucoma: Secondary | ICD-10-CM | POA: Diagnosis not present

## 2019-06-06 DIAGNOSIS — C9000 Multiple myeloma not having achieved remission: Secondary | ICD-10-CM

## 2019-06-06 DIAGNOSIS — Z801 Family history of malignant neoplasm of trachea, bronchus and lung: Secondary | ICD-10-CM | POA: Insufficient documentation

## 2019-06-06 DIAGNOSIS — M8580 Other specified disorders of bone density and structure, unspecified site: Secondary | ICD-10-CM | POA: Diagnosis not present

## 2019-06-06 DIAGNOSIS — Z79899 Other long term (current) drug therapy: Secondary | ICD-10-CM | POA: Diagnosis not present

## 2019-06-06 DIAGNOSIS — E041 Nontoxic single thyroid nodule: Secondary | ICD-10-CM | POA: Insufficient documentation

## 2019-06-06 DIAGNOSIS — I10 Essential (primary) hypertension: Secondary | ICD-10-CM | POA: Insufficient documentation

## 2019-06-06 DIAGNOSIS — Z7982 Long term (current) use of aspirin: Secondary | ICD-10-CM | POA: Insufficient documentation

## 2019-06-06 DIAGNOSIS — Z5111 Encounter for antineoplastic chemotherapy: Secondary | ICD-10-CM | POA: Diagnosis not present

## 2019-06-06 DIAGNOSIS — Z87891 Personal history of nicotine dependence: Secondary | ICD-10-CM | POA: Diagnosis not present

## 2019-06-06 DIAGNOSIS — Z803 Family history of malignant neoplasm of breast: Secondary | ICD-10-CM | POA: Diagnosis not present

## 2019-06-06 DIAGNOSIS — M7989 Other specified soft tissue disorders: Secondary | ICD-10-CM | POA: Insufficient documentation

## 2019-06-06 DIAGNOSIS — Z23 Encounter for immunization: Secondary | ICD-10-CM | POA: Diagnosis not present

## 2019-06-06 DIAGNOSIS — M25473 Effusion, unspecified ankle: Secondary | ICD-10-CM | POA: Diagnosis not present

## 2019-06-06 DIAGNOSIS — K5900 Constipation, unspecified: Secondary | ICD-10-CM | POA: Insufficient documentation

## 2019-06-06 DIAGNOSIS — R21 Rash and other nonspecific skin eruption: Secondary | ICD-10-CM | POA: Insufficient documentation

## 2019-06-06 DIAGNOSIS — Z7189 Other specified counseling: Secondary | ICD-10-CM

## 2019-06-06 DIAGNOSIS — M545 Low back pain: Secondary | ICD-10-CM | POA: Insufficient documentation

## 2019-06-06 LAB — CBC WITH DIFFERENTIAL/PLATELET
Abs Immature Granulocytes: 0.02 10*3/uL (ref 0.00–0.07)
Basophils Absolute: 0 10*3/uL (ref 0.0–0.1)
Basophils Relative: 1 %
Eosinophils Absolute: 0 10*3/uL (ref 0.0–0.5)
Eosinophils Relative: 1 %
HCT: 31.6 % — ABNORMAL LOW (ref 36.0–46.0)
Hemoglobin: 10.3 g/dL — ABNORMAL LOW (ref 12.0–15.0)
Immature Granulocytes: 1 %
Lymphocytes Relative: 3 %
Lymphs Abs: 0.1 10*3/uL — ABNORMAL LOW (ref 0.7–4.0)
MCH: 33.3 pg (ref 26.0–34.0)
MCHC: 32.6 g/dL (ref 30.0–36.0)
MCV: 102.3 fL — ABNORMAL HIGH (ref 80.0–100.0)
Monocytes Absolute: 0 10*3/uL — ABNORMAL LOW (ref 0.1–1.0)
Monocytes Relative: 1 %
Neutro Abs: 3.4 10*3/uL (ref 1.7–7.7)
Neutrophils Relative %: 93 %
Platelets: 385 10*3/uL (ref 150–400)
RBC: 3.09 MIL/uL — ABNORMAL LOW (ref 3.87–5.11)
RDW: 16.8 % — ABNORMAL HIGH (ref 11.5–15.5)
WBC: 3.7 10*3/uL — ABNORMAL LOW (ref 4.0–10.5)
nRBC: 0 % (ref 0.0–0.2)

## 2019-06-06 LAB — CMP (CANCER CENTER ONLY)
ALT: 14 U/L (ref 0–44)
AST: 15 U/L (ref 15–41)
Albumin: 4 g/dL (ref 3.5–5.0)
Alkaline Phosphatase: 39 U/L (ref 38–126)
Anion gap: 6 (ref 5–15)
BUN: 15 mg/dL (ref 8–23)
CO2: 27 mmol/L (ref 22–32)
Calcium: 9 mg/dL (ref 8.9–10.3)
Chloride: 104 mmol/L (ref 98–111)
Creatinine: 0.63 mg/dL (ref 0.44–1.00)
GFR, Est AFR Am: >60 mL/min (ref >60)
GFR, Estimated: >60 mL/min (ref >60)
Glucose, Bld: 133 mg/dL — ABNORMAL HIGH (ref 70–99)
Potassium: 4.4 mmol/L (ref 3.5–5.1)
Sodium: 137 mmol/L (ref 135–145)
Total Bilirubin: 0.5 mg/dL (ref 0.3–1.2)
Total Protein: 6.4 g/dL — ABNORMAL LOW (ref 6.5–8.1)

## 2019-06-06 MED ORDER — DEXAMETHASONE 4 MG PO TABS
20.0000 mg | ORAL_TABLET | Freq: Once | ORAL | Status: AC
  Administered 2019-06-06: 11:00:00 20 mg via ORAL

## 2019-06-06 MED ORDER — PROCHLORPERAZINE MALEATE 10 MG PO TABS
10.0000 mg | ORAL_TABLET | Freq: Once | ORAL | Status: AC
  Administered 2019-06-06: 10 mg via ORAL

## 2019-06-06 MED ORDER — DEXAMETHASONE 4 MG PO TABS
ORAL_TABLET | ORAL | Status: AC
  Filled 2019-06-06: qty 5

## 2019-06-06 MED ORDER — INFLUENZA VAC A&B SA ADJ QUAD 0.5 ML IM PRSY
0.5000 mL | PREFILLED_SYRINGE | Freq: Once | INTRAMUSCULAR | Status: DC
  Filled 2019-06-06: qty 0.5

## 2019-06-06 MED ORDER — INFLUENZA VAC A&B SA ADJ QUAD 0.5 ML IM PRSY
0.5000 mL | PREFILLED_SYRINGE | INTRAMUSCULAR | Status: DC
  Filled 2019-06-06: qty 0.5

## 2019-06-06 MED ORDER — INFLUENZA VAC SPLIT QUAD 0.5 ML IM SUSY: 0.5000 mL | PREFILLED_SYRINGE | Freq: Once | INTRAMUSCULAR | Status: DC

## 2019-06-06 MED ORDER — PROCHLORPERAZINE MALEATE 10 MG PO TABS
ORAL_TABLET | ORAL | Status: AC
  Filled 2019-06-06: qty 1

## 2019-06-06 MED ORDER — BORTEZOMIB CHEMO SQ INJECTION 3.5 MG (2.5MG/ML)
1.3000 mg/m2 | Freq: Once | INTRAMUSCULAR | Status: AC
  Administered 2019-06-06: 2 mg via SUBCUTANEOUS
  Filled 2019-06-06: qty 0.8

## 2019-06-06 NOTE — Addendum Note (Signed)
Addended by: Margaret Pyle on: 06/06/2019 11:26 AM   Modules accepted: Orders

## 2019-06-06 NOTE — Telephone Encounter (Signed)
Per 9/1 los F/u for remaining part of C9 and c10 as scheduled.

## 2019-06-06 NOTE — Patient Instructions (Signed)
[facility] Discharge Instructions for Patients Receiving Chemotherapy  Today you received the following chemotherapy agents Velcade.  To help prevent nausea and vomiting after your treatment, we encourage you to take your nausea medication as directed.  If you develop nausea and vomiting that is not controlled by your nausea medication, call the clinic.   BELOW ARE SYMPTOMS THAT SHOULD BE REPORTED IMMEDIATELY:  *FEVER GREATER THAN 100.5 F  *CHILLS WITH OR WITHOUT FEVER  NAUSEA AND VOMITING THAT IS NOT CONTROLLED WITH YOUR NAUSEA MEDICATION  *UNUSUAL SHORTNESS OF BREATH  *UNUSUAL BRUISING OR BLEEDING  TENDERNESS IN MOUTH AND THROAT WITH OR WITHOUT PRESENCE OF ULCERS  *URINARY PROBLEMS  *BOWEL PROBLEMS  UNUSUAL RASH Items with * indicate a potential emergency and should be followed up as soon as possible.  Feel free to call the clinic should you have any questions or concerns. The clinic phone number is [phone].  Please show the CHEMO ALERT CARD at check-in to the Emergency Department and triage nurse.   

## 2019-06-07 ENCOUNTER — Telehealth: Payer: Self-pay | Admitting: *Deleted

## 2019-06-07 ENCOUNTER — Other Ambulatory Visit: Payer: Self-pay | Admitting: *Deleted

## 2019-06-07 LAB — MULTIPLE MYELOMA PANEL, SERUM
Albumin SerPl Elph-Mcnc: 3.8 g/dL (ref 2.9–4.4)
Albumin/Glob SerPl: 1.9 — ABNORMAL HIGH (ref 0.7–1.7)
Alpha 1: 0.3 g/dL (ref 0.0–0.4)
Alpha2 Glob SerPl Elph-Mcnc: 0.8 g/dL (ref 0.4–1.0)
B-Globulin SerPl Elph-Mcnc: 0.9 g/dL (ref 0.7–1.3)
Gamma Glob SerPl Elph-Mcnc: 0.2 g/dL — ABNORMAL LOW (ref 0.4–1.8)
Globulin, Total: 2.1 g/dL — ABNORMAL LOW (ref 2.2–3.9)
IgA: 35 mg/dL — ABNORMAL LOW (ref 64–422)
IgG (Immunoglobin G), Serum: 547 mg/dL — ABNORMAL LOW (ref 586–1602)
IgM (Immunoglobulin M), Srm: 14 mg/dL — ABNORMAL LOW (ref 26–217)
M Protein SerPl Elph-Mcnc: 0.3 g/dL — ABNORMAL HIGH
Total Protein ELP: 5.9 g/dL — ABNORMAL LOW (ref 6.0–8.5)

## 2019-06-07 NOTE — Telephone Encounter (Signed)
Requested refill of oxycodone before the weekend

## 2019-06-07 NOTE — Telephone Encounter (Signed)
Patient called with question: Has Xgeva injection scheduled on 9/15 as only appt that day. Can it be moved back to 9/11 or forward to 9/22 to combine with another appt, so she doesn't have to come an extra time? Dr. Irene Limbo given question

## 2019-06-08 MED ORDER — OXYCODONE-ACETAMINOPHEN 5-325 MG PO TABS: 1.0000 | ORAL_TABLET | ORAL | 0 refills | Status: DC | PRN

## 2019-06-08 NOTE — Telephone Encounter (Signed)
Per Dr. Irene Limbo, can move Xgeva forward to 9/22. Schedule message sent. Patient notified and verbalized understanding.

## 2019-06-09 ENCOUNTER — Other Ambulatory Visit: Payer: Self-pay

## 2019-06-09 ENCOUNTER — Other Ambulatory Visit: Payer: Self-pay | Admitting: Hematology

## 2019-06-09 ENCOUNTER — Inpatient Hospital Stay: Payer: Medicare HMO

## 2019-06-09 VITALS — BP 179/78 | HR 66 | Temp 98.5°F | Resp 18

## 2019-06-09 DIAGNOSIS — M7989 Other specified soft tissue disorders: Secondary | ICD-10-CM | POA: Diagnosis not present

## 2019-06-09 DIAGNOSIS — Z23 Encounter for immunization: Secondary | ICD-10-CM | POA: Diagnosis not present

## 2019-06-09 DIAGNOSIS — C9000 Multiple myeloma not having achieved remission: Secondary | ICD-10-CM

## 2019-06-09 DIAGNOSIS — Z7189 Other specified counseling: Secondary | ICD-10-CM

## 2019-06-09 DIAGNOSIS — H409 Unspecified glaucoma: Secondary | ICD-10-CM | POA: Diagnosis not present

## 2019-06-09 DIAGNOSIS — R21 Rash and other nonspecific skin eruption: Secondary | ICD-10-CM | POA: Diagnosis not present

## 2019-06-09 DIAGNOSIS — K5900 Constipation, unspecified: Secondary | ICD-10-CM | POA: Diagnosis not present

## 2019-06-09 DIAGNOSIS — E041 Nontoxic single thyroid nodule: Secondary | ICD-10-CM | POA: Diagnosis not present

## 2019-06-09 DIAGNOSIS — M8580 Other specified disorders of bone density and structure, unspecified site: Secondary | ICD-10-CM | POA: Diagnosis not present

## 2019-06-09 DIAGNOSIS — Z5111 Encounter for antineoplastic chemotherapy: Secondary | ICD-10-CM | POA: Diagnosis not present

## 2019-06-09 DIAGNOSIS — Z79899 Other long term (current) drug therapy: Secondary | ICD-10-CM | POA: Diagnosis not present

## 2019-06-09 MED ORDER — BORTEZOMIB CHEMO SQ INJECTION 3.5 MG (2.5MG/ML)
1.3000 mg/m2 | Freq: Once | INTRAMUSCULAR | Status: AC
  Administered 2019-06-09: 2 mg via SUBCUTANEOUS
  Filled 2019-06-09: qty 0.8

## 2019-06-09 MED ORDER — PROCHLORPERAZINE MALEATE 10 MG PO TABS
10.0000 mg | ORAL_TABLET | Freq: Once | ORAL | Status: AC
  Administered 2019-06-09: 10 mg via ORAL

## 2019-06-09 MED ORDER — PROCHLORPERAZINE MALEATE 10 MG PO TABS
ORAL_TABLET | ORAL | Status: AC
  Filled 2019-06-09: qty 1

## 2019-06-09 NOTE — Patient Instructions (Signed)
[facility] Discharge Instructions for Patients Receiving Chemotherapy  Today you received the following chemotherapy agents Velcade.  To help prevent nausea and vomiting after your treatment, we encourage you to take your nausea medication as directed.  If you develop nausea and vomiting that is not controlled by your nausea medication, call the clinic.   BELOW ARE SYMPTOMS THAT SHOULD BE REPORTED IMMEDIATELY:  *FEVER GREATER THAN 100.5 F  *CHILLS WITH OR WITHOUT FEVER  NAUSEA AND VOMITING THAT IS NOT CONTROLLED WITH YOUR NAUSEA MEDICATION  *UNUSUAL SHORTNESS OF BREATH  *UNUSUAL BRUISING OR BLEEDING  TENDERNESS IN MOUTH AND THROAT WITH OR WITHOUT PRESENCE OF ULCERS  *URINARY PROBLEMS  *BOWEL PROBLEMS  UNUSUAL RASH Items with * indicate a potential emergency and should be followed up as soon as possible.  Feel free to call the clinic should you have any questions or concerns. The clinic phone number is [phone].  Please show the CHEMO ALERT CARD at check-in to the Emergency Department and triage nurse.   

## 2019-06-13 ENCOUNTER — Inpatient Hospital Stay: Payer: Medicare HMO

## 2019-06-13 ENCOUNTER — Other Ambulatory Visit: Payer: Self-pay

## 2019-06-13 ENCOUNTER — Ambulatory Visit: Payer: Medicare HMO

## 2019-06-13 VITALS — BP 177/86 | HR 68 | Temp 99.1°F | Resp 18

## 2019-06-13 DIAGNOSIS — M8580 Other specified disorders of bone density and structure, unspecified site: Secondary | ICD-10-CM | POA: Diagnosis not present

## 2019-06-13 DIAGNOSIS — C9000 Multiple myeloma not having achieved remission: Secondary | ICD-10-CM

## 2019-06-13 DIAGNOSIS — E041 Nontoxic single thyroid nodule: Secondary | ICD-10-CM | POA: Diagnosis not present

## 2019-06-13 DIAGNOSIS — R600 Localized edema: Secondary | ICD-10-CM

## 2019-06-13 DIAGNOSIS — K5900 Constipation, unspecified: Secondary | ICD-10-CM | POA: Diagnosis not present

## 2019-06-13 DIAGNOSIS — H409 Unspecified glaucoma: Secondary | ICD-10-CM | POA: Diagnosis not present

## 2019-06-13 DIAGNOSIS — G893 Neoplasm related pain (acute) (chronic): Secondary | ICD-10-CM

## 2019-06-13 DIAGNOSIS — M7989 Other specified soft tissue disorders: Secondary | ICD-10-CM | POA: Diagnosis not present

## 2019-06-13 DIAGNOSIS — Z79899 Other long term (current) drug therapy: Secondary | ICD-10-CM | POA: Diagnosis not present

## 2019-06-13 DIAGNOSIS — Z23 Encounter for immunization: Secondary | ICD-10-CM | POA: Diagnosis not present

## 2019-06-13 DIAGNOSIS — Z7189 Other specified counseling: Secondary | ICD-10-CM

## 2019-06-13 DIAGNOSIS — Z5111 Encounter for antineoplastic chemotherapy: Secondary | ICD-10-CM | POA: Diagnosis not present

## 2019-06-13 DIAGNOSIS — R21 Rash and other nonspecific skin eruption: Secondary | ICD-10-CM | POA: Diagnosis not present

## 2019-06-13 LAB — CBC WITH DIFFERENTIAL (CANCER CENTER ONLY)
Abs Immature Granulocytes: 0.02 10*3/uL (ref 0.00–0.07)
Basophils Absolute: 0 10*3/uL (ref 0.0–0.1)
Basophils Relative: 1 %
Eosinophils Absolute: 0.1 10*3/uL (ref 0.0–0.5)
Eosinophils Relative: 3 %
HCT: 30.1 % — ABNORMAL LOW (ref 36.0–46.0)
Hemoglobin: 9.8 g/dL — ABNORMAL LOW (ref 12.0–15.0)
Immature Granulocytes: 1 %
Lymphocytes Relative: 5 %
Lymphs Abs: 0.1 10*3/uL — ABNORMAL LOW (ref 0.7–4.0)
MCH: 33.4 pg (ref 26.0–34.0)
MCHC: 32.6 g/dL (ref 30.0–36.0)
MCV: 102.7 fL — ABNORMAL HIGH (ref 80.0–100.0)
Monocytes Absolute: 0.1 10*3/uL (ref 0.1–1.0)
Monocytes Relative: 5 %
Neutro Abs: 2.3 10*3/uL (ref 1.7–7.7)
Neutrophils Relative %: 85 %
Platelet Count: 195 10*3/uL (ref 150–400)
RBC: 2.93 MIL/uL — ABNORMAL LOW (ref 3.87–5.11)
RDW: 16.4 % — ABNORMAL HIGH (ref 11.5–15.5)
WBC Count: 2.7 10*3/uL — ABNORMAL LOW (ref 4.0–10.5)
nRBC: 0 % (ref 0.0–0.2)

## 2019-06-13 LAB — CMP (CANCER CENTER ONLY)
ALT: 12 U/L (ref 0–44)
AST: 13 U/L — ABNORMAL LOW (ref 15–41)
Albumin: 3.8 g/dL (ref 3.5–5.0)
Alkaline Phosphatase: 31 U/L — ABNORMAL LOW (ref 38–126)
Anion gap: 7 (ref 5–15)
BUN: 15 mg/dL (ref 8–23)
CO2: 29 mmol/L (ref 22–32)
Calcium: 8.7 mg/dL — ABNORMAL LOW (ref 8.9–10.3)
Chloride: 103 mmol/L (ref 98–111)
Creatinine: 0.62 mg/dL (ref 0.44–1.00)
GFR, Est AFR Am: >60 mL/min (ref >60)
GFR, Estimated: >60 mL/min (ref >60)
Glucose, Bld: 108 mg/dL — ABNORMAL HIGH (ref 70–99)
Potassium: 4.2 mmol/L (ref 3.5–5.1)
Sodium: 139 mmol/L (ref 135–145)
Total Bilirubin: 0.4 mg/dL (ref 0.3–1.2)
Total Protein: 5.6 g/dL — ABNORMAL LOW (ref 6.5–8.1)

## 2019-06-13 MED ORDER — BORTEZOMIB CHEMO SQ INJECTION 3.5 MG (2.5MG/ML)
1.3000 mg/m2 | Freq: Once | INTRAMUSCULAR | Status: AC
  Administered 2019-06-13: 2 mg via SUBCUTANEOUS
  Filled 2019-06-13: qty 0.8

## 2019-06-13 MED ORDER — PROCHLORPERAZINE MALEATE 10 MG PO TABS
ORAL_TABLET | ORAL | Status: AC
  Filled 2019-06-13: qty 1

## 2019-06-13 MED ORDER — PROCHLORPERAZINE MALEATE 10 MG PO TABS
10.0000 mg | ORAL_TABLET | Freq: Once | ORAL | Status: AC
  Administered 2019-06-13: 10 mg via ORAL

## 2019-06-13 MED ORDER — DEXAMETHASONE 4 MG PO TABS
20.0000 mg | ORAL_TABLET | Freq: Once | ORAL | Status: AC
  Administered 2019-06-13: 20 mg via ORAL

## 2019-06-13 MED ORDER — DEXAMETHASONE 4 MG PO TABS
ORAL_TABLET | ORAL | Status: AC
  Filled 2019-06-13: qty 5

## 2019-06-13 MED ORDER — INFLUENZA VAC A&B SA ADJ QUAD 0.5 ML IM PRSY
0.5000 mL | PREFILLED_SYRINGE | Freq: Once | INTRAMUSCULAR | Status: AC
  Administered 2019-06-13: 0.5 mL via INTRAMUSCULAR
  Filled 2019-06-13: qty 0.5

## 2019-06-13 NOTE — Patient Instructions (Addendum)
[facility] Discharge Instructions for Patients Receiving Chemotherapy  Today you received the following chemotherapy agents: Velcade.  To help prevent nausea and vomiting after your treatment, we encourage you to take your nausea medication as directed.   If you develop nausea and vomiting that is not controlled by your nausea medication, call the clinic.   BELOW ARE SYMPTOMS THAT SHOULD BE REPORTED IMMEDIATELY:  *FEVER GREATER THAN 100.5 F  *CHILLS WITH OR WITHOUT FEVER  NAUSEA AND VOMITING THAT IS NOT CONTROLLED WITH YOUR NAUSEA MEDICATION  *UNUSUAL SHORTNESS OF BREATH  *UNUSUAL BRUISING OR BLEEDING  TENDERNESS IN MOUTH AND THROAT WITH OR WITHOUT PRESENCE OF ULCERS  *URINARY PROBLEMS  *BOWEL PROBLEMS  UNUSUAL RASH Items with * indicate a potential emergency and should be followed up as soon as possible.  Feel free to call the clinic should you have any questions or concerns. The clinic phone number is [phone].  Please show the [place] at check-in to the Emergency Department and triage nurse.   Influenza (Flu) Vaccine (Inactivated or Recombinant): What You Need to Know 1. Why get vaccinated? Influenza vaccine can prevent influenza (flu). Flu is a contagious disease that spreads around the [place] every year, usually between October and May. Anyone can get the flu, but it is more dangerous for some people. Infants and young children, people 76 years of age and older, pregnant women, and people with certain health conditions or a weakened immune system are at greatest risk of flu complications. Pneumonia, bronchitis, sinus infections and ear infections are examples of flu-related complications. If you have a medical condition, such as heart disease, cancer or diabetes, flu can make it worse. Flu can cause fever and chills, sore throat, muscle aches, fatigue, cough, headache, and runny or stuffy nose. Some people may have vomiting and  diarrhea, though this is more common in children than adults. Each year thousands of people in the [place] die from flu, and many more are hospitalized. Flu vaccine prevents millions of illnesses and flu-related visits to the doctor each year. 2. Influenza vaccine CDC recommends everyone 9 months of age and older get vaccinated every flu season. Children 6 months through 63 years of age may need 2 doses during a single flu season. Everyone else needs only 1 dose each flu season. It takes about 2 weeks for protection to develop after vaccination. There are many flu viruses, and they are always changing. Each year a new flu vaccine is made to protect against three or four viruses that are likely to cause disease in the upcoming flu season. Even when the vaccine doesn't exactly match these viruses, it may still provide some protection. Influenza vaccine does not cause flu. Influenza vaccine may be given at the same time as other vaccines. 3. Talk with your health care provider Tell your vaccine provider if the person getting the vaccine:  Has had an allergic reaction after a previous dose of influenza vaccine, or has any severe, life-threatening allergies.  Has ever had Guillain-Barr Syndrome (also called GBS). In some cases, your health care provider may decide to postpone influenza vaccination to a future visit. People with minor illnesses, such as a cold, may be vaccinated. People who are moderately or severely ill should usually wait until they recover before getting influenza vaccine. Your health care provider can give you more information. 4. Risks of a vaccine reaction  Soreness, redness, and swelling where shot is given, fever, muscle aches, and headache can  happen after influenza vaccine.  There may be a very small increased risk of Guillain-Barr Syndrome (GBS) after inactivated influenza vaccine (the flu shot). Young children who get the flu shot along with pneumococcal vaccine  (PCV13), and/or DTaP vaccine at the same time might be slightly more likely to have a seizure caused by fever. Tell your health care provider if a child who is getting flu vaccine has ever had a seizure. People sometimes faint after medical procedures, including vaccination. Tell your provider if you feel dizzy or have vision changes or ringing in the ears. As with any medicine, there is a very remote chance of a vaccine causing a severe allergic reaction, other serious injury, or death. 5. What if there is a serious problem? An allergic reaction could occur after the vaccinated person leaves the clinic. If you see signs of a severe allergic reaction (hives, swelling of the face and throat, difficulty breathing, a fast heartbeat, dizziness, or weakness), call 9-1-1 and get the person to the nearest hospital. For other signs that concern you, call your health care provider. Adverse reactions should be reported to the Vaccine Adverse Event Reporting System (VAERS). Your health care provider will usually file this report, or you can do it yourself. Visit the VAERS website at www.vaers.SamedayNews.es or call 513-258-6274.VAERS is only for reporting reactions, and VAERS staff do not give medical advice. 6. The National Vaccine Injury Compensation Program The [employer] Vaccine Injury Compensation Program (VICP) is a federal program that was created to compensate people who may have been injured by certain vaccines. Visit the VICP website at GoldCloset.com.ee or call (407)009-3795 to learn about the program and about filing a claim. There is a time limit to file a claim for compensation. 7. How can I learn more?  Ask your healthcare provider.  Call your local or state health department.  Contact the Centers for Disease Control and Prevention (CDC): ? Call 313-580-3908 (1-800-CDC-INFO) or ? Visit CDC's https://gibson.com/ Vaccine Information Statement (Interim) Inactivated Influenza Vaccine  (05/19/2018) This information is not intended to replace advice given to you by your health care provider. Make sure you discuss any questions you have with your health care provider. Document Released: 07/16/2006 Document Revised: 01/10/2019 Document Reviewed: 05/23/2018 Elsevier Patient Education  2020 [place].  High-Protein and High-Calorie Diet Eating high-protein and high-calorie foods can help you to gain weight, heal after an injury, and recover after an illness or surgery. The specific amount of daily protein and calories you need depends on:  Your body weight.  The reason this diet is recommended for you. What is my plan? Generally, a high-protein, high-calorie diet involves:  Eating 250-500 extra calories each day.  Making sure that you get enough of your daily calories from protein. Ask your health care provider how many of your calories should come from protein. Talk with a health care provider, such as a diet and nutrition specialist (dietitian), about how much protein and how many calories you need each day. Follow the diet as directed by your health care provider. What are tips for following this plan?  Preparing meals  Add whole milk, half-and-half, or heavy cream to cereal, pudding, soup, or hot cocoa.  Add whole milk to instant breakfast drinks.  Add peanut butter to oatmeal or smoothies.  Add powdered milk to baked goods, smoothies, or milkshakes.  Add powdered milk, cream, or butter to mashed potatoes.  Add cheese to cooked vegetables.  Make whole-milk yogurt parfaits. Top them with granola, fruit, or  nuts.  Add cottage cheese to your fruit.  Add avocado, cheese, or both to sandwiches or salads.  Add meat, poultry, or seafood to rice, pasta, casseroles, salads, and soups.  Use mayonnaise when making egg salad, chicken salad, or tuna salad.  Use peanut butter as a dip for vegetables or as a topping for pretzels, celery, or crackers.  Add beans to  casseroles, dips, and spreads.  Add pureed beans to sauces and soups.  Replace calorie-free drinks with calorie-containing drinks, such as milk and fruit juice.  Replace water with milk or heavy cream when making foods such as oatmeal, pudding, or cocoa. General instructions  Ask your health care provider if you should take a nutritional supplement.  Try to eat six small meals each day instead of three large meals.  Eat a balanced diet. In each meal, include one food that is high in protein.  Keep nutritious snacks available, such as nuts, trail mixes, dried fruit, and yogurt.  If you have kidney disease or diabetes, talk with your health care provider about how much protein is safe for you. Too much protein may put extra stress on your kidneys.  Drink your calories. Choose high-calorie drinks and have them after your meals. What high-protein foods should I eat?  Vegetables Soybeans. Peas. Grains Quinoa. Bulgur wheat. Meats and other proteins Beef, pork, and poultry. Fish and seafood. Eggs. Tofu. Textured vegetable protein (TVP). Peanut butter. Nuts and seeds. Dried beans. Protein powders. Dairy Whole milk. Whole-milk yogurt. Powdered milk. Cheese. [employer]. Eggnog. Beverages High-protein supplement drinks. Soy milk. Other foods Protein bars. The items listed above may not be a complete list of high-protein foods and beverages. Contact a dietitian for more options. What high-calorie foods should I eat? Fruits Dried fruit. Fruit leather. Canned fruit in syrup. Fruit juice. Avocado. Vegetables Vegetables cooked in oil or butter. Fried potatoes. Grains Pasta. Quick breads. Muffins. Pancakes. Ready-to-eat cereal. Meats and other proteins Peanut butter. Nuts and seeds. Dairy Heavy cream. Whipped cream. Cream cheese. Sour cream. Ice cream. Custard. Pudding. Beverages Meal-replacement beverages. Nutrition shakes. Fruit juice. Sugar-sweetened soft drinks. Seasonings and  condiments Salad dressing. Mayonnaise. Alfredo sauce. Fruit preserves or jelly. Honey. Syrup. Sweets and desserts Cake. Cookies. Pie. Pastries. Candy bars. Chocolate. Fats and oils Butter or margarine. Oil. Gravy. Other foods Meal-replacement bars. The items listed above may not be a complete list of high-calorie foods and beverages. Contact a dietitian for more options. Summary  A high-protein, high-calorie diet can help you gain weight or heal faster after an injury, illness, or surgery.  To increase your protein and calories, add ingredients such as whole milk, peanut butter, cheese, beans, meat, or seafood to meal items.  To get enough extra calories each day, include high-calorie foods and beverages at each meal.  Adding a high-calorie drink or shake can be an easy way to help you get enough calories each day. Talk with your healthcare provider or dietitian about the best options for you. This information is not intended to replace advice given to you by your health care provider. Make sure you discuss any questions you have with your health care provider. Document Released: 09/21/2005 Document Revised: 09/03/2017 Document Reviewed: 08/03/2017 Elsevier Patient Education  2020 [employer].

## 2019-06-15 ENCOUNTER — Other Ambulatory Visit: Payer: Self-pay | Admitting: *Deleted

## 2019-06-15 MED ORDER — FENTANYL 25 MCG/HR TD PT72: 1.0000 | MEDICATED_PATCH | TRANSDERMAL | 0 refills | Status: DC

## 2019-06-16 ENCOUNTER — Inpatient Hospital Stay: Payer: Medicare HMO

## 2019-06-16 ENCOUNTER — Other Ambulatory Visit: Payer: Self-pay

## 2019-06-16 VITALS — BP 157/86 | HR 68 | Temp 98.9°F | Resp 18

## 2019-06-16 DIAGNOSIS — H409 Unspecified glaucoma: Secondary | ICD-10-CM | POA: Diagnosis not present

## 2019-06-16 DIAGNOSIS — M8580 Other specified disorders of bone density and structure, unspecified site: Secondary | ICD-10-CM | POA: Diagnosis not present

## 2019-06-16 DIAGNOSIS — Z7189 Other specified counseling: Secondary | ICD-10-CM

## 2019-06-16 DIAGNOSIS — E041 Nontoxic single thyroid nodule: Secondary | ICD-10-CM | POA: Diagnosis not present

## 2019-06-16 DIAGNOSIS — Z5111 Encounter for antineoplastic chemotherapy: Secondary | ICD-10-CM | POA: Diagnosis not present

## 2019-06-16 DIAGNOSIS — Z79899 Other long term (current) drug therapy: Secondary | ICD-10-CM | POA: Diagnosis not present

## 2019-06-16 DIAGNOSIS — Z23 Encounter for immunization: Secondary | ICD-10-CM | POA: Diagnosis not present

## 2019-06-16 DIAGNOSIS — C9000 Multiple myeloma not having achieved remission: Secondary | ICD-10-CM | POA: Diagnosis not present

## 2019-06-16 DIAGNOSIS — M7989 Other specified soft tissue disorders: Secondary | ICD-10-CM | POA: Diagnosis not present

## 2019-06-16 DIAGNOSIS — K5900 Constipation, unspecified: Secondary | ICD-10-CM | POA: Diagnosis not present

## 2019-06-16 DIAGNOSIS — R21 Rash and other nonspecific skin eruption: Secondary | ICD-10-CM | POA: Diagnosis not present

## 2019-06-16 MED ORDER — BORTEZOMIB CHEMO SQ INJECTION 3.5 MG (2.5MG/ML)
1.3000 mg/m2 | Freq: Once | INTRAMUSCULAR | Status: AC
  Administered 2019-06-16: 11:00:00 2 mg via SUBCUTANEOUS
  Filled 2019-06-16: qty 0.8

## 2019-06-16 MED ORDER — PROCHLORPERAZINE MALEATE 10 MG PO TABS
ORAL_TABLET | ORAL | Status: AC
  Filled 2019-06-16: qty 1

## 2019-06-16 MED ORDER — PROCHLORPERAZINE MALEATE 10 MG PO TABS
10.0000 mg | ORAL_TABLET | Freq: Once | ORAL | Status: AC
  Administered 2019-06-16: 10:00:00 10 mg via ORAL

## 2019-06-16 NOTE — Patient Instructions (Signed)
[facility] Discharge Instructions for Patients Receiving Chemotherapy  Today you received the following chemotherapy agents Velcade.  To help prevent nausea and vomiting after your treatment, we encourage you to take your nausea medication as directed.  If you develop nausea and vomiting that is not controlled by your nausea medication, call the clinic.   BELOW ARE SYMPTOMS THAT SHOULD BE REPORTED IMMEDIATELY:  *FEVER GREATER THAN 100.5 F  *CHILLS WITH OR WITHOUT FEVER  NAUSEA AND VOMITING THAT IS NOT CONTROLLED WITH YOUR NAUSEA MEDICATION  *UNUSUAL SHORTNESS OF BREATH  *UNUSUAL BRUISING OR BLEEDING  TENDERNESS IN MOUTH AND THROAT WITH OR WITHOUT PRESENCE OF ULCERS  *URINARY PROBLEMS  *BOWEL PROBLEMS  UNUSUAL RASH Items with * indicate a potential emergency and should be followed up as soon as possible.  Feel free to call the clinic should you have any questions or concerns. The clinic phone number is [phone].  Please show the CHEMO ALERT CARD at check-in to the Emergency Department and triage nurse.   

## 2019-06-20 ENCOUNTER — Ambulatory Visit: Payer: Medicare HMO

## 2019-06-21 DIAGNOSIS — R69 Illness, unspecified: Secondary | ICD-10-CM | POA: Diagnosis not present

## 2019-06-21 MED FILL — CYCLOPHOSPHAMIDE 50 MG CAPS: 50 | 28 days supply | Qty: 36 | Fill #1

## 2019-06-22 ENCOUNTER — Telehealth: Payer: Self-pay | Admitting: *Deleted

## 2019-06-22 NOTE — Telephone Encounter (Signed)
Patient called 9/15. Had Velcade injection last Tuesday and another last Friday. Area near injection from last Tuesday bright red and very itchy. Area near injection from last Friday not red or itchy. She has not changed detergent or skin care products. She is using hydrocortisone creme with some relief and asked if she should do anything else. Asking if she should be concerned?  Per Dr.Kale, advise patient of following: Can continue to use OTC hydrocortisone creme and can add Claritin 10 mg daily until symptoms subside. If blistering occurs or area spreads, please contact office. Patient verbalized understanding. Patient called 9/17 to update - Continuing to use OTC hydrocortisone creme - redness and itching is subsiding after starting on Claritin. Area of concern is getting smaller. Patient advised to continue with hydrocortisone and Claritin until  area clears. She verbalized understanding and states she will call office if it does not get better or if it gets worse.

## 2019-06-25 NOTE — Progress Notes (Signed)
HEMATOLOGY/ONCOLOGY CLINIC NOTE  Date of Service: 06/27/2019  Patient Care Team: [name], MD as PCP - General (Family Medicine) [name], MD as Consulting Physician (Hematology) Bond, [name], MD as Referring Physician (Ophthalmology)  Dr. Ronney Lion as [place] Specialist  [phone]  CHIEF COMPLAINTS/PURPOSE OF CONSULTATION:   Multiple Myeloma- continued management  HISTORY OF PRESENTING ILLNESS:   Megan Olson is a wonderful 78 y.o. female who has been referred to [place] by Dr. 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 She is accompanied today by her husband. The pt reports that she is doing well overall.   The pt notes that she was doing extensive yard work in October 2019, and began to feel back pain a few days after this, which she attributed to muscle pain. She recalls taking a deep breath, and developing sudden acute pain, and presented to care with her PCP's office. She began exercises for her back pain, without relief, then began PT without relief, then was referred to Dr. Paulla Fore in sports medicine in late January 2020. She had an XR which revealed a compression fracture at T11, then subsequently had an MRI, and a bone marrow biopsy. The pt notes that her back pain "seemed to move around." She endorses pain "up the whole left side" of her back.  The pt notes that she is not able to stand up straight due to her back pain, which she feels limits her ability to take a deep breath, and endorses pain exacerbation when she does take a deep breath. The pt needs assistance from sitting to lying from her husband. She is using about 3 Percocet a day.  The pt notes worsened constipation since beginning Percocet, and notes that her most recent laxative order was not covered by her insurance. She is using prune juice and milk of magnesia. She took Senokot S BID without relief.  The pt reports that  prior to her recent back pain, she had very few medical concerns. She endorses controlled BP, and has been monitored for DM but after closely watching her diet her A1C decreased to 5.8. She has glaucoma, and has had surgery in both eyes. Right eye with stent. The pt sees Dr. Edilia Bo at [facility] for her eye care. The pt notes that her vision has been recently "pretty good." The pt notes that she has been advised to limit treatment with steroids for her glaucoma. She denies heart or lung problems. Denies previous back problems. Last DEXA scan 3 years ago, and endorses osteopenia. She notes that she took Fosamax for 3-4 years, and stopped 5-6 years ago. She takes Vitamin D, a multivitamin, and magnesium.  The pt notes that she quit smoking cigarettes when she was 27, started when she was about 20. The pt consumes alcohol rarely, but not since beginning narcotics. 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  Of note prior to the patient's visit today, pt has had an MRI Thoracic Spine completed on 11/27/18 with results revealing Multifocal marrow signal abnormality consistent with metastatic disease or multiple myeloma. The patient has multiple compression fractures most consistent with pathologic injuries. Extensive marrow signal abnormality makes determining age of the fractures difficult but edema is most intense in T3. 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  Most recent lab results (12/01/18) of CBC w/diff is as follows: all  values are WNL except for RBC at 3.17, HGB at 10.4, HCT at 33.5, MCV at 105.7. 11/28/18 CMP revealed all values WNL except for Glucose at 105, Total Protein at 10.2, Albumin at 3.1 11/30/18 24HR UPEP revealed all values WNL except for M-spike at 75.1%. 11/28/18 Bega-2 microglobulin slightly elevated at 2.6 11/28/18 SPEP revealed M spike at 4.6g 11/28/18 Immunoglobulins revealed IgG at 6181, IgA at 24,  IgM at 16, and IgE at 6.  On review of systems, pt reports constipation, back pain, stable energy levels, ankle swelling, tenderness at T3, lower back pain, and denies abdominal pains, neck pain, and any other symptoms.   On PMHx the pt reports redundant colon, single hemorrhoid, glaucoma, HTN, osteopenia, Tonsillectomy, right eye stent and multiple eye surgeries. On Social Hx the pt reports rare alcohol use, smoked cigarettes between ages 54 and 86. 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 On Family Hx the pt reports sister died from small cell lung cancer and was a lifelong smoker, brother's daughter died of breast cancer with BRCA1 and BRCA2 mutations. Cousin with female breast cancer.   INTERVAL HISTORY:   Megan Olson returns today for management and evaluation of her multiple myeloma and C10D1 of dexamethazone, bortezomib, cytoxan and denosumab at this time. The patient's last visit with [place] was on 06/06/2019. The pt reports that she is doing well overall.  The pt reports that her energy levels have been pretty good. She has been walking outside with her husband and cleaning around the house. Pt has been trying to stand in a walker and work on her balance. Her weight is up and she has a decent appetite. Pt claims to eat many times throughout the day. She is concerned about getting the COVID19 vaccine if and when it's available. Pt is anxious about her husband having to transport her to [facility] daily after a potential transplant. Pt has been taking Claritin to minimize the redness on her abdomen from her Velcade shot.   Lab results today (06/27/19) of CBC w/diff and CMP is as follows: all values are WNL except for RBC at 2.86, Hgb at 9.8, HCT at 30.0, MCV at 104.9, MCH at 34.3, RDW at 16.1, Lymphs Abs at 0.1K, Glucose at 100, Total Protein at 5.9, Alkaline Phosphatase at 36.  On review of systems, pt reports good appetite and denies fatigue and any other symptoms.   MEDICAL HISTORY:  Past  Medical History:  Diagnosis Date  . Cancer ([place])    multiple myeloma  . Glaucoma   . HYPERTENSION 03/11/2007  . OSTEOPENIA 03/11/2007    SURGICAL HISTORY: Past Surgical History:  Procedure Laterality Date  . BREAST EXCISIONAL BIOPSY Right 2000  . BREAST LUMPECTOMY  1990   benign  . DILATION AND CURETTAGE OF UTERUS     bleeding at menopause. No uterine cancer  . IR RADIOLOGIST EVAL & MGMT  12/13/2018  . TONSILLECTOMY     age 12    SOCIAL HISTORY: Social History   Socioeconomic History  . Marital status: Married    Spouse name: Not on file  . Number of children: Not on file  . Years of education: Not on file  . Highest education level: Not on file  Occupational History  . Not on file  Social Needs  . Financial resource strain: Not on file  . Food insecurity    Worry: Not on file    Inability: Not on file  . Transportation needs    Medical: Not on file  Non-medical: Not on file  Tobacco Use  . Smoking status: Former Smoker    Packs/day: 0.50    Years: 7.00    Pack years: 3.50    Types: Cigarettes    Quit date: 01/04/1961    Years since quitting: 58.5  . Smokeless tobacco: Never Used  Substance and Sexual Activity  . Alcohol use: Yes    Alcohol/week: 1.0 standard drinks    Types: 1 Standard drinks or equivalent per week  . Drug use: No  . Sexual activity: Not Currently  Lifestyle  . Physical activity    Days per week: Not on file    Minutes per session: Not on file  . Stress: Not on file  Relationships  . Social Herbalist on phone: Not on file    Gets together: Not on file    Attends religious service: Not on file    Active member of club or organization: Not on file    Attends meetings of clubs or organizations: Not on file    Relationship status: Not on file  . Intimate partner violence    Fear of current or ex partner: Not on file    Emotionally abused: Not on file    Physically abused: Not on file    Forced sexual activity: Not on file   Other Topics Concern  . Not on file  Social History Narrative   Married. Lives with husband (patient of Dr. Yong Channel). 1 son. No grandkids. 1 granddog.       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 Heart disease Mother        CHF mother died 35  . Arthritis Mother   . Glaucoma Mother        sister as well  . Alcohol abuse Father   . Suicidality Father   . Cancer Sister        lung cancer, smoker  . Heart disease Sister        aortic valve replacement  . Hyperlipidemia Brother   . Hypertension Brother   . COPD Brother   . Arthritis Sister   . Hypertension Sister   . Glaucoma Sister   . Hashimoto's thyroiditis Sister   . Hypertension Son   . Stroke Maternal Grandmother   . Colon cancer Neg Hx   . Colon polyps Neg Hx     ALLERGIES:  is allergic to ace inhibitors; benadryl [diphenhydramine]; diamox [acetazolamide]; sulfamethoxazole; lenalidomide; and penicillins.   MEDICATIONS:  Current Outpatient Medications  Medication Sig Dispense Refill  . acyclovir (ZOVIRAX) 400 MG tablet Take 1 tablet (400 mg total) by mouth 2 (two) times daily. 60 tablet 3  . ALPHAGAN P 0.1 % SOLN Apply 1 drop topically See admin instructions. Apply 2 drops in right eye 3 times a day    . amLODipine (NORVASC) 5 MG tablet Take 1 tablet (5 mg total) by mouth daily. 90 tablet 3  . aspirin EC 81 MG tablet Take 81 mg by mouth daily.    . bimatoprost (LUMIGAN) 0.03 % ophthalmic solution Place 1 drop into the right eye at bedtime.     . Cholecalciferol (VITAMIN D3) 1000 UNITS CAPS Take 1,000 Units by mouth 2 (two) times daily.     Marland Kitchen co-enzyme Q-10 50 MG  capsule Take 50 mg by mouth daily.      . cyclophosphamide (CYTOXAN) 50 MG capsule TAKE 9 CAPSULES (450 MG TOTAL) BY MOUTH ONCE A WEEK. TAKE WITH FOOD TO MINIMIZE GI UPSET.  TAKE EARLY IN THE DAY AND MAINTAIN HYDRATION. 36 capsule 2  . dexamethasone (DECADRON) 4 MG tablet '20mg'$  (5 tabs) on Day 15 of each cycle of treatment 15 tablet 3  . dorzolamide-timolol (COSOPT) 22.3-6.8 MG/ML ophthalmic solution Place 2 drops into both eyes 2 (two) times daily.   11  . fentaNYL (DURAGESIC) 25 MCG/HR Place 1 patch onto the skin every 3 (three) days. 10 patch 0  . hydrOXYzine (ATARAX/VISTARIL) 25 MG tablet Take 1 tablet (25 mg total) by mouth 3 (three) times daily as needed. 30 tablet 0  . magnesium hydroxide (MILK OF MAGNESIA) 400 MG/5ML suspension Take 15 mLs by mouth daily as needed for mild constipation or moderate constipation. 355 mL 0  . Magnesium Oxide 500 MG TABS Take 1 tablet by mouth daily.      . Multiple Vitamins-Minerals (MULTIVITAMIN WITH MINERALS) tablet Take 1 tablet by mouth daily.      . ondansetron (ZOFRAN) 8 MG tablet Take 1 tablet (8 mg total) by mouth 2 (two) times daily as needed (Nausea or vomiting). 30 tablet 1  . oxyCODONE-acetaminophen (PERCOCET) 5-325 MG tablet Take 1-2 tablets by mouth every 4 (four) hours as needed for moderate pain or severe pain. 90 tablet 0  . polyethylene glycol (MIRALAX) packet Take 17 g by mouth daily. 30 each 1  . prochlorperazine (COMPAZINE) 10 MG tablet Take 1 tablet (10 mg total) by mouth every 6 (six) hours as needed (Nausea or vomiting). 30 tablet 1  . senna-docusate (SENOKOT-S) 8.6-50 MG tablet Take 2 tablets by mouth at bedtime. 60 tablet 2  . vitamin C (ASCORBIC ACID) 500 MG tablet Take 500 mg by mouth daily.     No current facility-administered medications for this visit.     REVIEW OF SYSTEMS:    A 10+ POINT REVIEW OF SYSTEMS WAS OBTAINED including neurology, dermatology, psychiatry, cardiac, respiratory, lymph, extremities, GI, GU, Musculoskeletal, constitutional, breasts, reproductive, HEENT.  All pertinent positives are noted in the HPI.  All others are negative.    PHYSICAL EXAMINATION: ECOG PERFORMANCE STATUS:  1-2  Vitals:   06/27/19 0910  BP: (!) 149/88  Pulse: 71  Resp: 18  Temp: 97.8 F (36.6 C)  SpO2: 99%   Filed Weights   06/27/19 0910  Weight: 105 lb 14.4 oz (48 kg)   Body mass index is 19.06 kg/m.   Exam was given in a wheelchair  GENERAL:alert, in no acute distress and comfortable SKIN: no significant lesions, erythematous rash nearly resolved on lower right abdomen. EYES: conjunctiva are pink and non-injected, sclera anicteric OROPHARYNX: MMM, no exudates, no oropharyngeal erythema or ulceration NECK: supple, no JVD LYMPH:  no palpable lymphadenopathy in the cervical, axillary or inguinal regions LUNGS: clear to auscultation b/l with normal respiratory effort HEART: regular rate & rhythm ABDOMEN:  normoactive bowel sounds , non tender, not distended. No palpable hepatosplenomegaly.  Extremity: no pedal edema PSYCH: alert & oriented x 3 with fluent speech NEURO: no focal motor/sensory deficits  LABORATORY DATA:  I have reviewed the data as listed  . CBC Latest Ref Rng & Units 06/27/2019 06/13/2019 06/06/2019  WBC 4.0 - 10.5 K/uL 4.5 2.7(L) 3.7(L)  Hemoglobin 12.0 - 15.0 g/dL 9.8(L) 9.8(L) 10.3(L)  Hematocrit 36.0 - 46.0 % 30.0(L) 30.1(L) 31.6(L)  Platelets 150 -  400 K/uL 321 195 385    . CMP Latest Ref Rng & Units 06/27/2019 06/13/2019 06/06/2019  Glucose 70 - 99 mg/dL 100(H) 108(H) 133(H)  BUN 8 - 23 mg/dL '16 15 15  '$ Creatinine 0.44 - 1.00 mg/dL 0.61 0.62 0.63  Sodium 135 - 145 mmol/L 140 139 137  Potassium 3.5 - 5.1 mmol/L 4.2 4.2 4.4  Chloride 98 - 111 mmol/L 103 103 104  CO2 22 - 32 mmol/L '29 29 27  '$ Calcium 8.9 - 10.3 mg/dL 8.9 8.7(L) 9.0  Total Protein 6.5 - 8.1 g/dL 5.9(L) 5.6(L) 6.4(L)  Total Bilirubin 0.3 - 1.2 mg/dL 0.4 0.4 0.5  Alkaline Phos 38 - 126 U/L 36(L) 31(L) 39  AST 15 - 41 U/L 15 13(L) 15  ALT 0 - 44 U/L '16 12 14   '$ Most recent MMP 04/25/2019 Component     Latest Ref Rng & Units 04/25/2019  IgG (Immunoglobin G), Serum     586 - 1,602 mg/dL 702   IgA     64 - 422 mg/dL 39 (L)  IgM (Immunoglobulin M), Srm     26 - 217 mg/dL 26  Total Protein ELP     6.0 - 8.5 g/dL 5.9 (L)  Albumin SerPl Elph-Mcnc     2.9 - 4.4 g/dL 3.8  Alpha 1     0.0 - 0.4 g/dL 0.2  Alpha2 Glob SerPl Elph-Mcnc     0.4 - 1.0 g/dL 0.7  B-Globulin SerPl Elph-Mcnc     0.7 - 1.3 g/dL 1.0  Gamma Glob SerPl Elph-Mcnc     0.4 - 1.8 g/dL 0.3 (L)  M Protein SerPl Elph-Mcnc     Not Observed g/dL 0.4 (H)  Globulin, Total     2.2 - 3.9 g/dL 2.1 (L)  Albumin/Glob SerPl     0.7 - 1.7 1.9 (H)  IFE 1      Comment  Please Note (HCV):      Comment    12/01/18 BM Bx:   12/01/18 Flow Cytometry:       RADIOGRAPHIC STUDIES: I have personally reviewed the radiological images as listed and agreed with the findings in the report. No results found.   ASSESSMENT & PLAN:  78 y.o. female with  1. Multiple Myeloma with IgG Lambda specificity Labs upon initial presentation from 12/01/18 CBC w/diff revealed HGB at 10.4 with MCV of 105.7. 11/28/18 CMP revealed Creatinine normal at 0.68 and Calcium normal at 8.9. 11/28/18 Beta 2 microglobulin at 2.'6mg'$  (also a reading at 4.'7mg'$  on the same day). 11/30/18 24HR UPEP revealed M spike at 75.1%. 11/28/18 SPEP revealed M spike of 4.6g. 11/28/18 Immunoglobulins revealed IgG elevated at '6181mg'$ .  12/01/18 BM Bx revealed hypercellular bone marrow with 70-80% CD138 immunohistochemistry (44% aspirate) lambda-restricted plasma cells as well as a kappa restricted population of B-cells 11/27/18 MRI Thoracic Spine which revealed Multifocal marrow signal abnormality consistent with metastatic disease or multiple myeloma. The patient has multiple compression fractures most consistent with pathologic injuries. Extensive marrow signal abnormality makes determining age of the fractures difficult but edema is most intense in T3. 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 2. Areas of hypermetabolic disease identified in both  lungs suggesting multiple myeloma involvement. 3. 1.9 cm calcified left thyroid nodule is hypermetabolic, but Indeterminate. 4. Colon is diffusely distended with gas and stool. Imaging features would be compatible with clinical constipation. 5.  Aortic Atherosclerois.  Pt describes grade II to III rash on her re-challenge from Revlimid with optimized pre-medications, and we discontinued Revlimid  Began Cytoxan with C4 Velcade and Dexamethasone  05/16/2019 M spike is down to 0.2g/dl showing continued improvement and a 90% reduction  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 She does not currently have elevated lymphocyte counts on peripheral blood draws.   PLAN: -Discussed pt labwork today, 06/27/19; all values are WNL except for RBC at 2.86, Hgb at 9.8, HCT at 30.0, MCV at 104.9, MCH at 34.3, RDW at 16.1, Lymphs Abs at 0.1K, Glucose at 100, Total Protein at 5.9, Alkaline Phosphatase at 36. -Discussed 06/06/2019 MMP is as follows: IgG at 547, IgA at 35, IgM at 14, Total Protein ELP at 5.9, Albumin at 3.8, Alpha 1 at 0.3, Alpha2 at 0.8, B-Globulin at 0.9, Gamma Glob at 0.2, M Protein at 0.3, Total Globulin at 2.1, Albumin/Glob at 1.9, IFE 1 shows "Immunofixation shows IgG monoclonal protein with lambda light chain specificity."  -Advised pt that we will revisit a discussion on a potential COVID19 vaccine if and when it becomes available -Would advise pt to pursue Shingrix after 6 months on maintenance.   -The pt has no prohibitive toxicities from continuing C10D1 dexamethazone, bortezomib, cytoxan and denosumab at this time. -Would like to refer pt to [facility] transplant team. Pt would like to talk to her husband about the transplant before making a final decision. -Will order PET/CT in 2 weeks  -Will order BM Bx in 2 weeks   FOLLOW UP: -PET/CT in 2 weeks -CT bone marrow biopsy in 2 weeks -Please schedule C11 of treatment with labs and MD visit with C11D1 of treatment  The total time spent in the appt was 28mnutes and more than 50% was on counseling and direct patient cares.  All of the patient's questions were answered with apparent satisfaction. The patient knows to call the clinic with any problems, questions or concerns. .Sullivan LoneMD MCollinsAAHIVMS SClarinda Regional Health CenterCKiowa District HospitalHematology/Oncology Physician CRivers[facility] (Office):       [phone](Work cell):  [phone](Fax):           [phone] 06/27/2019 8:57 AM  I, JYevette Edwards am acting as a scribe for Dr. GSullivan Lone   .I have reviewed the above documentation for accuracy and completeness, and I agree with the above. .Brunetta GeneraMD

## 2019-06-27 ENCOUNTER — Inpatient Hospital Stay: Payer: Medicare HMO

## 2019-06-27 ENCOUNTER — Inpatient Hospital Stay (HOSPITAL_BASED_OUTPATIENT_CLINIC_OR_DEPARTMENT_OTHER): Payer: Medicare HMO | Admitting: Hematology

## 2019-06-27 ENCOUNTER — Other Ambulatory Visit: Payer: Self-pay

## 2019-06-27 VITALS — BP 149/88 | HR 71 | Temp 97.8°F | Resp 18 | Ht 62.5 in | Wt 105.9 lb

## 2019-06-27 DIAGNOSIS — E041 Nontoxic single thyroid nodule: Secondary | ICD-10-CM | POA: Diagnosis not present

## 2019-06-27 DIAGNOSIS — C9000 Multiple myeloma not having achieved remission: Secondary | ICD-10-CM

## 2019-06-27 DIAGNOSIS — M7989 Other specified soft tissue disorders: Secondary | ICD-10-CM | POA: Diagnosis not present

## 2019-06-27 DIAGNOSIS — M8580 Other specified disorders of bone density and structure, unspecified site: Secondary | ICD-10-CM | POA: Diagnosis not present

## 2019-06-27 DIAGNOSIS — G893 Neoplasm related pain (acute) (chronic): Secondary | ICD-10-CM

## 2019-06-27 DIAGNOSIS — H409 Unspecified glaucoma: Secondary | ICD-10-CM | POA: Diagnosis not present

## 2019-06-27 DIAGNOSIS — Z7189 Other specified counseling: Secondary | ICD-10-CM

## 2019-06-27 DIAGNOSIS — Z5111 Encounter for antineoplastic chemotherapy: Secondary | ICD-10-CM

## 2019-06-27 DIAGNOSIS — Z79899 Other long term (current) drug therapy: Secondary | ICD-10-CM | POA: Diagnosis not present

## 2019-06-27 DIAGNOSIS — R21 Rash and other nonspecific skin eruption: Secondary | ICD-10-CM | POA: Diagnosis not present

## 2019-06-27 DIAGNOSIS — Z23 Encounter for immunization: Secondary | ICD-10-CM | POA: Diagnosis not present

## 2019-06-27 DIAGNOSIS — K5900 Constipation, unspecified: Secondary | ICD-10-CM | POA: Diagnosis not present

## 2019-06-27 LAB — CMP (CANCER CENTER ONLY)
ALT: 16 U/L (ref 0–44)
AST: 15 U/L (ref 15–41)
Albumin: 3.9 g/dL (ref 3.5–5.0)
Alkaline Phosphatase: 36 U/L — ABNORMAL LOW (ref 38–126)
Anion gap: 8 (ref 5–15)
BUN: 16 mg/dL (ref 8–23)
CO2: 29 mmol/L (ref 22–32)
Calcium: 8.9 mg/dL (ref 8.9–10.3)
Chloride: 103 mmol/L (ref 98–111)
Creatinine: 0.61 mg/dL (ref 0.44–1.00)
GFR, Est AFR Am: >60 mL/min (ref >60)
GFR, Estimated: >60 mL/min (ref >60)
Glucose, Bld: 100 mg/dL — ABNORMAL HIGH (ref 70–99)
Potassium: 4.2 mmol/L (ref 3.5–5.1)
Sodium: 140 mmol/L (ref 135–145)
Total Bilirubin: 0.4 mg/dL (ref 0.3–1.2)
Total Protein: 5.9 g/dL — ABNORMAL LOW (ref 6.5–8.1)

## 2019-06-27 LAB — CBC WITH DIFFERENTIAL/PLATELET
Abs Immature Granulocytes: 0.04 10*3/uL (ref 0.00–0.07)
Basophils Absolute: 0 10*3/uL (ref 0.0–0.1)
Basophils Relative: 0 %
Eosinophils Absolute: 0.1 10*3/uL (ref 0.0–0.5)
Eosinophils Relative: 1 %
HCT: 30 % — ABNORMAL LOW (ref 36.0–46.0)
Hemoglobin: 9.8 g/dL — ABNORMAL LOW (ref 12.0–15.0)
Immature Granulocytes: 1 %
Lymphocytes Relative: 3 %
Lymphs Abs: 0.1 10*3/uL — ABNORMAL LOW (ref 0.7–4.0)
MCH: 34.3 pg — ABNORMAL HIGH (ref 26.0–34.0)
MCHC: 32.7 g/dL (ref 30.0–36.0)
MCV: 104.9 fL — ABNORMAL HIGH (ref 80.0–100.0)
Monocytes Absolute: 0.1 10*3/uL (ref 0.1–1.0)
Monocytes Relative: 2 %
Neutro Abs: 4.2 10*3/uL (ref 1.7–7.7)
Neutrophils Relative %: 93 %
Platelets: 321 10*3/uL (ref 150–400)
RBC: 2.86 MIL/uL — ABNORMAL LOW (ref 3.87–5.11)
RDW: 16.1 % — ABNORMAL HIGH (ref 11.5–15.5)
WBC: 4.5 10*3/uL (ref 4.0–10.5)
nRBC: 0 % (ref 0.0–0.2)

## 2019-06-27 MED ORDER — BORTEZOMIB CHEMO SQ INJECTION 3.5 MG (2.5MG/ML)
1.3000 mg/m2 | Freq: Once | INTRAMUSCULAR | Status: AC
  Administered 2019-06-27: 2 mg via SUBCUTANEOUS
  Filled 2019-06-27: qty 0.8

## 2019-06-27 MED ORDER — DENOSUMAB 120 MG/1.7ML ~~LOC~~ SOLN
120.0000 mg | Freq: Once | SUBCUTANEOUS | Status: AC
  Administered 2019-06-27: 11:00:00 120 mg via SUBCUTANEOUS

## 2019-06-27 MED ORDER — DENOSUMAB 120 MG/1.7ML ~~LOC~~ SOLN
SUBCUTANEOUS | Status: AC
  Filled 2019-06-27: qty 1.7

## 2019-06-27 MED ORDER — PROCHLORPERAZINE MALEATE 10 MG PO TABS
ORAL_TABLET | ORAL | Status: AC
  Filled 2019-06-27: qty 1

## 2019-06-27 MED ORDER — DEXAMETHASONE 4 MG PO TABS
ORAL_TABLET | ORAL | Status: AC
  Filled 2019-06-27: qty 5

## 2019-06-27 MED ORDER — PROCHLORPERAZINE MALEATE 10 MG PO TABS
10.0000 mg | ORAL_TABLET | Freq: Once | ORAL | Status: AC
  Administered 2019-06-27: 10 mg via ORAL

## 2019-06-27 MED ORDER — DEXAMETHASONE 4 MG PO TABS
20.0000 mg | ORAL_TABLET | Freq: Once | ORAL | Status: AC
  Administered 2019-06-27: 10:00:00 20 mg via ORAL

## 2019-06-27 NOTE — Patient Instructions (Addendum)
[facility] Discharge Instructions for Patients Receiving Chemotherapy  Today you received the following chemotherapy agents: Velcade.  To help prevent nausea and vomiting after your treatment, we encourage you to take your nausea medication as directed.   If you develop nausea and vomiting that is not controlled by your nausea medication, call the clinic.   BELOW ARE SYMPTOMS THAT SHOULD BE REPORTED IMMEDIATELY:  *FEVER GREATER THAN 100.5 F  *CHILLS WITH OR WITHOUT FEVER  NAUSEA AND VOMITING THAT IS NOT CONTROLLED WITH YOUR NAUSEA MEDICATION  *UNUSUAL SHORTNESS OF BREATH  *UNUSUAL BRUISING OR BLEEDING  TENDERNESS IN MOUTH AND THROAT WITH OR WITHOUT PRESENCE OF ULCERS  *URINARY PROBLEMS  *BOWEL PROBLEMS  UNUSUAL RASH Items with * indicate a potential emergency and should be followed up as soon as possible.  Feel free to call the clinic should you have any questions or concerns. The clinic phone number is [phone].  Please show the CHEMO ALERT CARD at check-in to the Emergency Department and triage nurse.   Denosumab injection What is this medicine? DENOSUMAB (den oh sue mab) slows bone breakdown. Prolia is used to treat osteoporosis in women after menopause and in men, and in people who are taking corticosteroids for 6 months or more. Xgeva is used to treat a high calcium level due to cancer and to prevent bone fractures and other bone problems caused by multiple myeloma or cancer bone metastases. Xgeva is also used to treat giant cell tumor of the bone. This medicine may be used for other purposes; ask your health care provider or pharmacist if you have questions. COMMON BRAND NAME(S): Prolia, XGEVA What should I tell my health care provider before I take this medicine? They need to know if you have any of these conditions:  dental disease  having surgery or tooth extraction  infection  kidney disease  low levels of calcium or Vitamin D in the  blood  malnutrition  on hemodialysis  skin conditions or sensitivity  thyroid or parathyroid disease  an unusual reaction to denosumab, other medicines, foods, dyes, or preservatives  pregnant or trying to get pregnant  breast-feeding How should I use this medicine? This medicine is for injection under the skin. It is given by a health care professional in a hospital or clinic setting. A special MedGuide will be given to you before each treatment. Be sure to read this information carefully each time. For Prolia, talk to your pediatrician regarding the use of this medicine in children. Special care may be needed. For Xgeva, talk to your pediatrician regarding the use of this medicine in children. While this drug may be prescribed for children as young as 13 years for selected conditions, precautions do apply. Overdosage: If you think you have taken too much of this medicine contact a poison control center or emergency room at once. NOTE: This medicine is only for you. Do not share this medicine with others. What if I miss a dose? It is important not to miss your dose. Call your doctor or health care professional if you are unable to keep an appointment. What may interact with this medicine? Do not take this medicine with any of the following medications:  other medicines containing denosumab This medicine may also interact with the following medications:  medicines that lower your chance of fighting infection  steroid medicines like prednisone or cortisone This list may not describe all possible interactions. Give your health care provider a list of all the medicines, herbs,   drugs, or dietary supplements you use. Also tell them if you smoke, drink alcohol, or use illegal drugs. Some items may interact with your medicine. What should I watch for while using this medicine? Visit your doctor or health care professional for regular checks on your progress. Your doctor or  health care professional may order blood tests and other tests to see how you are doing. Call your doctor or health care professional for advice if you get a fever, chills or sore throat, or other symptoms of a cold or flu. Do not treat yourself. This drug may decrease your body's ability to fight infection. Try to avoid being around people who are sick. You should make sure you get enough calcium and vitamin D while you are taking this medicine, unless your doctor tells you not to. Discuss the foods you eat and the vitamins you take with your health care professional. See your dentist regularly. Brush and floss your teeth as directed. Before you have any dental work done, tell your dentist you are receiving this medicine. Do not become pregnant while taking this medicine or for 5 months after stopping it. Talk with your doctor or health care professional about your birth control options while taking this medicine. Women should inform their doctor if they wish to become pregnant or think they might be pregnant. There is a potential for serious side effects to an unborn child. Talk to your health care professional or pharmacist for more information. What side effects may I notice from receiving this medicine? Side effects that you should report to your doctor or health care professional as soon as possible:  allergic reactions like skin rash, itching or hives, swelling of the face, lips, or tongue  bone pain  breathing problems  dizziness  jaw pain, especially after dental work  redness, blistering, peeling of the skin  signs and symptoms of infection like fever or chills; cough; sore throat; pain or trouble passing urine  signs of low calcium like fast heartbeat, muscle cramps or muscle pain; pain, tingling, numbness in the hands or feet; seizures  unusual bleeding or bruising  unusually weak or tired Side effects that usually do not require medical attention (report to your doctor or  health care professional if they continue or are bothersome):  constipation  diarrhea  headache  joint pain  loss of appetite  muscle pain  runny nose  tiredness  upset stomach This list may not describe all possible side effects. Call your doctor for medical advice about side effects. You may report side effects to FDA at 1-800-FDA-1088. Where should I keep my medicine? This medicine is only given in a clinic, doctor's office, or other health care setting and will not be stored at home. NOTE: This sheet is a summary. It may not cover all possible information. If you have questions about this medicine, talk to your doctor, pharmacist, or health care provider.  2020 Elsevier/Gold Standard (2018-01-28 16:10:44)

## 2019-06-28 ENCOUNTER — Other Ambulatory Visit: Payer: Self-pay | Admitting: *Deleted

## 2019-06-28 ENCOUNTER — Telehealth: Payer: Self-pay | Admitting: Hematology

## 2019-06-28 MED ORDER — OXYCODONE-ACETAMINOPHEN 5-325 MG PO TABS: 1.0000 | ORAL_TABLET | ORAL | 0 refills | Status: DC | PRN

## 2019-06-28 NOTE — Telephone Encounter (Signed)
Per 9/22 los cycle 11 is already scheduled.

## 2019-06-28 NOTE — Telephone Encounter (Signed)
Requested refill of oxycodone - will need before weekend

## 2019-06-29 ENCOUNTER — Ambulatory Visit: Payer: Medicare HMO

## 2019-06-29 DIAGNOSIS — H401122 Primary open-angle glaucoma, left eye, moderate stage: Secondary | ICD-10-CM | POA: Diagnosis not present

## 2019-06-29 DIAGNOSIS — H401112 Primary open-angle glaucoma, right eye, moderate stage: Secondary | ICD-10-CM | POA: Diagnosis not present

## 2019-06-30 ENCOUNTER — Inpatient Hospital Stay: Payer: Medicare HMO

## 2019-06-30 ENCOUNTER — Other Ambulatory Visit: Payer: Self-pay

## 2019-06-30 VITALS — BP 172/91 | HR 70 | Temp 98.7°F | Resp 18

## 2019-06-30 DIAGNOSIS — C9000 Multiple myeloma not having achieved remission: Secondary | ICD-10-CM | POA: Diagnosis not present

## 2019-06-30 DIAGNOSIS — Z79899 Other long term (current) drug therapy: Secondary | ICD-10-CM | POA: Diagnosis not present

## 2019-06-30 DIAGNOSIS — H409 Unspecified glaucoma: Secondary | ICD-10-CM | POA: Diagnosis not present

## 2019-06-30 DIAGNOSIS — R21 Rash and other nonspecific skin eruption: Secondary | ICD-10-CM | POA: Diagnosis not present

## 2019-06-30 DIAGNOSIS — Z5111 Encounter for antineoplastic chemotherapy: Secondary | ICD-10-CM | POA: Diagnosis not present

## 2019-06-30 DIAGNOSIS — K5900 Constipation, unspecified: Secondary | ICD-10-CM | POA: Diagnosis not present

## 2019-06-30 DIAGNOSIS — E041 Nontoxic single thyroid nodule: Secondary | ICD-10-CM | POA: Diagnosis not present

## 2019-06-30 DIAGNOSIS — Z7189 Other specified counseling: Secondary | ICD-10-CM

## 2019-06-30 DIAGNOSIS — Z23 Encounter for immunization: Secondary | ICD-10-CM | POA: Diagnosis not present

## 2019-06-30 DIAGNOSIS — M7989 Other specified soft tissue disorders: Secondary | ICD-10-CM | POA: Diagnosis not present

## 2019-06-30 DIAGNOSIS — M8580 Other specified disorders of bone density and structure, unspecified site: Secondary | ICD-10-CM | POA: Diagnosis not present

## 2019-06-30 MED ORDER — BORTEZOMIB CHEMO SQ INJECTION 3.5 MG (2.5MG/ML)
1.3000 mg/m2 | Freq: Once | INTRAMUSCULAR | Status: AC
  Administered 2019-06-30: 2 mg via SUBCUTANEOUS
  Filled 2019-06-30: qty 0.8

## 2019-06-30 MED ORDER — PROCHLORPERAZINE MALEATE 10 MG PO TABS
ORAL_TABLET | ORAL | Status: AC
  Filled 2019-06-30: qty 1

## 2019-06-30 MED ORDER — PROCHLORPERAZINE MALEATE 10 MG PO TABS
10.0000 mg | ORAL_TABLET | Freq: Once | ORAL | Status: AC
  Administered 2019-06-30: 10:00:00 10 mg via ORAL

## 2019-06-30 NOTE — Patient Instructions (Signed)
[facility] Discharge Instructions for Patients Receiving Chemotherapy  Today you received the following chemotherapy agents: Velcade.  To help prevent nausea and vomiting after your treatment, we encourage you to take your nausea medication as directed.   If you develop nausea and vomiting that is not controlled by your nausea medication, call the clinic.   BELOW ARE SYMPTOMS THAT SHOULD BE REPORTED IMMEDIATELY:  *FEVER GREATER THAN 100.5 F  *CHILLS WITH OR WITHOUT FEVER  NAUSEA AND VOMITING THAT IS NOT CONTROLLED WITH YOUR NAUSEA MEDICATION  *UNUSUAL SHORTNESS OF BREATH  *UNUSUAL BRUISING OR BLEEDING  TENDERNESS IN MOUTH AND THROAT WITH OR WITHOUT PRESENCE OF ULCERS  *URINARY PROBLEMS  *BOWEL PROBLEMS  UNUSUAL RASH Items with * indicate a potential emergency and should be followed up as soon as possible.  Feel free to call the clinic should you have any questions or concerns. The clinic phone number is [phone].  Please show the CHEMO ALERT CARD at check-in to the Emergency Department and triage nurse.   Denosumab injection What is this medicine? DENOSUMAB (den oh sue mab) slows bone breakdown. Prolia is used to treat osteoporosis in women after menopause and in men, and in people who are taking corticosteroids for 6 months or more. Xgeva is used to treat a high calcium level due to cancer and to prevent bone fractures and other bone problems caused by multiple myeloma or cancer bone metastases. Xgeva is also used to treat giant cell tumor of the bone. This medicine may be used for other purposes; ask your health care provider or pharmacist if you have questions. COMMON BRAND NAME(S): Prolia, XGEVA What should I tell my health care provider before I take this medicine? They need to know if you have any of these conditions:  dental disease  having surgery or tooth extraction  infection  kidney disease  low levels of calcium or Vitamin D in the  blood  malnutrition  on hemodialysis  skin conditions or sensitivity  thyroid or parathyroid disease  an unusual reaction to denosumab, other medicines, foods, dyes, or preservatives  pregnant or trying to get pregnant  breast-feeding How should I use this medicine? This medicine is for injection under the skin. It is given by a health care professional in a hospital or clinic setting. A special MedGuide will be given to you before each treatment. Be sure to read this information carefully each time. For Prolia, talk to your pediatrician regarding the use of this medicine in children. Special care may be needed. For Xgeva, talk to your pediatrician regarding the use of this medicine in children. While this drug may be prescribed for children as young as 13 years for selected conditions, precautions do apply. Overdosage: If you think you have taken too much of this medicine contact a poison control center or emergency room at once. NOTE: This medicine is only for you. Do not share this medicine with others. What if I miss a dose? It is important not to miss your dose. Call your doctor or health care professional if you are unable to keep an appointment. What may interact with this medicine? Do not take this medicine with any of the following medications:  other medicines containing denosumab This medicine may also interact with the following medications:  medicines that lower your chance of fighting infection  steroid medicines like prednisone or cortisone This list may not describe all possible interactions. Give your health care provider a list of all the medicines, herbs,   drugs, or dietary supplements you use. Also tell them if you smoke, drink alcohol, or use illegal drugs. Some items may interact with your medicine. What should I watch for while using this medicine? Visit your doctor or health care professional for regular checks on your progress. Your doctor or  health care professional may order blood tests and other tests to see how you are doing. Call your doctor or health care professional for advice if you get a fever, chills or sore throat, or other symptoms of a cold or flu. Do not treat yourself. This drug may decrease your body's ability to fight infection. Try to avoid being around people who are sick. You should make sure you get enough calcium and vitamin D while you are taking this medicine, unless your doctor tells you not to. Discuss the foods you eat and the vitamins you take with your health care professional. See your dentist regularly. Brush and floss your teeth as directed. Before you have any dental work done, tell your dentist you are receiving this medicine. Do not become pregnant while taking this medicine or for 5 months after stopping it. Talk with your doctor or health care professional about your birth control options while taking this medicine. Women should inform their doctor if they wish to become pregnant or think they might be pregnant. There is a potential for serious side effects to an unborn child. Talk to your health care professional or pharmacist for more information. What side effects may I notice from receiving this medicine? Side effects that you should report to your doctor or health care professional as soon as possible:  allergic reactions like skin rash, itching or hives, swelling of the face, lips, or tongue  bone pain  breathing problems  dizziness  jaw pain, especially after dental work  redness, blistering, peeling of the skin  signs and symptoms of infection like fever or chills; cough; sore throat; pain or trouble passing urine  signs of low calcium like fast heartbeat, muscle cramps or muscle pain; pain, tingling, numbness in the hands or feet; seizures  unusual bleeding or bruising  unusually weak or tired Side effects that usually do not require medical attention (report to your doctor or  health care professional if they continue or are bothersome):  constipation  diarrhea  headache  joint pain  loss of appetite  muscle pain  runny nose  tiredness  upset stomach This list may not describe all possible side effects. Call your doctor for medical advice about side effects. You may report side effects to FDA at 1-800-FDA-1088. Where should I keep my medicine? This medicine is only given in a clinic, doctor's office, or other health care setting and will not be stored at home. NOTE: This sheet is a summary. It may not cover all possible information. If you have questions about this medicine, talk to your doctor, pharmacist, or health care provider.  2020 Elsevier/Gold Standard (2018-01-28 16:10:44)

## 2019-07-04 ENCOUNTER — Other Ambulatory Visit: Payer: Self-pay

## 2019-07-04 ENCOUNTER — Inpatient Hospital Stay: Payer: Medicare HMO

## 2019-07-04 VITALS — BP 165/79 | HR 70 | Temp 98.3°F | Resp 16 | Wt 104.5 lb

## 2019-07-04 DIAGNOSIS — Z7189 Other specified counseling: Secondary | ICD-10-CM

## 2019-07-04 DIAGNOSIS — Z5111 Encounter for antineoplastic chemotherapy: Secondary | ICD-10-CM | POA: Diagnosis not present

## 2019-07-04 DIAGNOSIS — C9000 Multiple myeloma not having achieved remission: Secondary | ICD-10-CM

## 2019-07-04 DIAGNOSIS — K5900 Constipation, unspecified: Secondary | ICD-10-CM | POA: Diagnosis not present

## 2019-07-04 DIAGNOSIS — M7989 Other specified soft tissue disorders: Secondary | ICD-10-CM | POA: Diagnosis not present

## 2019-07-04 DIAGNOSIS — M8580 Other specified disorders of bone density and structure, unspecified site: Secondary | ICD-10-CM | POA: Diagnosis not present

## 2019-07-04 DIAGNOSIS — H409 Unspecified glaucoma: Secondary | ICD-10-CM | POA: Diagnosis not present

## 2019-07-04 DIAGNOSIS — R21 Rash and other nonspecific skin eruption: Secondary | ICD-10-CM | POA: Diagnosis not present

## 2019-07-04 DIAGNOSIS — Z23 Encounter for immunization: Secondary | ICD-10-CM | POA: Diagnosis not present

## 2019-07-04 DIAGNOSIS — E041 Nontoxic single thyroid nodule: Secondary | ICD-10-CM | POA: Diagnosis not present

## 2019-07-04 DIAGNOSIS — Z79899 Other long term (current) drug therapy: Secondary | ICD-10-CM | POA: Diagnosis not present

## 2019-07-04 LAB — CBC WITH DIFFERENTIAL/PLATELET
Abs Immature Granulocytes: 0.02 10*3/uL (ref 0.00–0.07)
Basophils Absolute: 0 10*3/uL (ref 0.0–0.1)
Basophils Relative: 0 %
Eosinophils Absolute: 0.1 10*3/uL (ref 0.0–0.5)
Eosinophils Relative: 2 %
HCT: 30.3 % — ABNORMAL LOW (ref 36.0–46.0)
Hemoglobin: 9.8 g/dL — ABNORMAL LOW (ref 12.0–15.0)
Immature Granulocytes: 1 %
Lymphocytes Relative: 4 %
Lymphs Abs: 0.1 10*3/uL — ABNORMAL LOW (ref 0.7–4.0)
MCH: 34.9 pg — ABNORMAL HIGH (ref 26.0–34.0)
MCHC: 32.3 g/dL (ref 30.0–36.0)
MCV: 107.8 fL — ABNORMAL HIGH (ref 80.0–100.0)
Monocytes Absolute: 0.1 10*3/uL (ref 0.1–1.0)
Monocytes Relative: 4 %
Neutro Abs: 2.4 10*3/uL (ref 1.7–7.7)
Neutrophils Relative %: 89 %
Platelets: 167 10*3/uL (ref 150–400)
RBC: 2.81 MIL/uL — ABNORMAL LOW (ref 3.87–5.11)
RDW: 14.8 % (ref 11.5–15.5)
WBC: 2.7 10*3/uL — ABNORMAL LOW (ref 4.0–10.5)
nRBC: 0 % (ref 0.0–0.2)

## 2019-07-04 LAB — CMP (CANCER CENTER ONLY)
ALT: 16 U/L (ref 0–44)
AST: 15 U/L (ref 15–41)
Albumin: 3.9 g/dL (ref 3.5–5.0)
Alkaline Phosphatase: 35 U/L — ABNORMAL LOW (ref 38–126)
Anion gap: 7 (ref 5–15)
BUN: 14 mg/dL (ref 8–23)
CO2: 28 mmol/L (ref 22–32)
Calcium: 8.5 mg/dL — ABNORMAL LOW (ref 8.9–10.3)
Chloride: 104 mmol/L (ref 98–111)
Creatinine: 0.61 mg/dL (ref 0.44–1.00)
GFR, Est AFR Am: >60 mL/min (ref >60)
GFR, Estimated: >60 mL/min (ref >60)
Glucose, Bld: 109 mg/dL — ABNORMAL HIGH (ref 70–99)
Potassium: 4.1 mmol/L (ref 3.5–5.1)
Sodium: 139 mmol/L (ref 135–145)
Total Bilirubin: 0.3 mg/dL (ref 0.3–1.2)
Total Protein: 5.8 g/dL — ABNORMAL LOW (ref 6.5–8.1)

## 2019-07-04 MED ORDER — PROCHLORPERAZINE MALEATE 10 MG PO TABS
ORAL_TABLET | ORAL | Status: AC
  Filled 2019-07-04: qty 1

## 2019-07-04 MED ORDER — BORTEZOMIB CHEMO SQ INJECTION 3.5 MG (2.5MG/ML)
1.3000 mg/m2 | Freq: Once | INTRAMUSCULAR | Status: AC
  Administered 2019-07-04: 2 mg via SUBCUTANEOUS
  Filled 2019-07-04: qty 0.8

## 2019-07-04 MED ORDER — DEXAMETHASONE 4 MG PO TABS
20.0000 mg | ORAL_TABLET | Freq: Once | ORAL | Status: AC
  Administered 2019-07-04: 20 mg via ORAL

## 2019-07-04 MED ORDER — PROCHLORPERAZINE MALEATE 10 MG PO TABS
10.0000 mg | ORAL_TABLET | Freq: Once | ORAL | Status: AC
  Administered 2019-07-04: 09:00:00 10 mg via ORAL

## 2019-07-04 MED ORDER — DEXAMETHASONE 4 MG PO TABS
ORAL_TABLET | ORAL | Status: AC
  Filled 2019-07-04: qty 5

## 2019-07-04 NOTE — Patient Instructions (Signed)
[facility] Discharge Instructions for Patients Receiving Chemotherapy  Today you received the following chemotherapy agents Velcade  To help prevent nausea and vomiting after your treatment, we encourage you to take your nausea medication as directed by your MD.   If you develop nausea and vomiting that is not controlled by your nausea medication, call the clinic.   BELOW ARE SYMPTOMS THAT SHOULD BE REPORTED IMMEDIATELY:  *FEVER GREATER THAN 100.5 F  *CHILLS WITH OR WITHOUT FEVER  NAUSEA AND VOMITING THAT IS NOT CONTROLLED WITH YOUR NAUSEA MEDICATION  *UNUSUAL SHORTNESS OF BREATH  *UNUSUAL BRUISING OR BLEEDING  TENDERNESS IN MOUTH AND THROAT WITH OR WITHOUT PRESENCE OF ULCERS  *URINARY PROBLEMS  *BOWEL PROBLEMS  UNUSUAL RASH Items with * indicate a potential emergency and should be followed up as soon as possible.  Feel free to call the clinic should you have any questions or concerns. The clinic phone number is [phone].  Please show the CHEMO ALERT CARD at check-in to the Emergency Department and triage nurse.  Coronavirus (COVID-19) Are you at risk?  Are you at risk for the Coronavirus (COVID-19)?  To be considered HIGH RISK for Coronavirus (COVID-19), you have to meet the following criteria:  . Traveled to [place], [place], [place], [place] or [place]; or in the [place] to [place], [place], [place], or [place]; and have fever, cough, and shortness of breath within the last 2 weeks of travel OR . Been in close contact with a person diagnosed with COVID-19 within the last 2 weeks and have fever, cough, and shortness of breath . IF YOU DO NOT MEET THESE CRITERIA, YOU ARE CONSIDERED LOW RISK FOR COVID-19.  What to do if you are HIGH RISK for COVID-19?  . If you are having a medical emergency, call 911. . Seek medical care right away. Before you go to a doctor's office, urgent care or emergency department, call ahead and tell them about  your recent travel, contact with someone diagnosed with COVID-19, and your symptoms. You should receive instructions from your physician's office regarding next steps of care.  . When you arrive at healthcare provider, tell the healthcare staff immediately you have returned from visiting [place], [place], [place], [place] or [place]; or traveled in the [place] to [place], [place], [place], or [place]; in the last two weeks or you have been in close contact with a person diagnosed with COVID-19 in the last 2 weeks.   . Tell the health care staff about your symptoms: fever, cough and shortness of breath. . After you have been seen by a medical provider, you will be either: o Tested for (COVID-19) and discharged home on quarantine except to seek medical care if symptoms worsen, and asked to  - Stay home and avoid contact with others until you get your results (4-5 days)  - Avoid travel on public transportation if possible (such as bus, train, or airplane) or o Sent to the Emergency Department by EMS for evaluation, COVID-19 testing, and possible admission depending on your condition and test results.  What to do if you are LOW RISK for COVID-19?  Reduce your risk of any infection by using the same precautions used for avoiding the common cold or flu:  . Wash your hands often with soap and warm water for at least 20 seconds.  If soap and water are not readily available, use an alcohol-based hand sanitizer with at least 60% alcohol.  . If   coughing or sneezing, cover your mouth and nose by coughing or sneezing into the elbow areas of your shirt or coat, into a tissue or into your sleeve (not your hands). . Avoid shaking hands with others and consider head nods or verbal greetings only. . Avoid touching your eyes, nose, or mouth with unwashed hands.  . Avoid close contact with people who are sick. . Avoid places or events with large numbers of people in one location, like concerts or sporting  events. . Carefully consider travel plans you have or are making. . If you are planning any travel outside or inside the [place], visit the CDC's Travelers' Health webpage for the latest health notices. . If you have some symptoms but not all symptoms, continue to monitor at home and seek medical attention if your symptoms worsen. . If you are having a medical emergency, call 911.   ADDITIONAL HEALTHCARE OPTIONS FOR PATIENTS   Telehealth / e-Visit: [email or web address]         [facility]: [phone]  [facility]: [phone]                   [facility]: [phone]    

## 2019-07-06 LAB — MULTIPLE MYELOMA PANEL, SERUM
Albumin SerPl Elph-Mcnc: 3.5 g/dL (ref 2.9–4.4)
Albumin/Glob SerPl: 1.9 — ABNORMAL HIGH (ref 0.7–1.7)
Alpha 1: 0.2 g/dL (ref 0.0–0.4)
Alpha2 Glob SerPl Elph-Mcnc: 0.7 g/dL (ref 0.4–1.0)
B-Globulin SerPl Elph-Mcnc: 0.8 g/dL (ref 0.7–1.3)
Gamma Glob SerPl Elph-Mcnc: 0.2 g/dL — ABNORMAL LOW (ref 0.4–1.8)
Globulin, Total: 1.9 g/dL — ABNORMAL LOW (ref 2.2–3.9)
IgA: 31 mg/dL — ABNORMAL LOW (ref 64–422)
IgG (Immunoglobin G), Serum: 438 mg/dL — ABNORMAL LOW (ref 586–1602)
IgM (Immunoglobulin M), Srm: 13 mg/dL — ABNORMAL LOW (ref 26–217)
M Protein SerPl Elph-Mcnc: 0.2 g/dL — ABNORMAL HIGH
Total Protein ELP: 5.4 g/dL — ABNORMAL LOW (ref 6.0–8.5)

## 2019-07-07 ENCOUNTER — Inpatient Hospital Stay: Payer: Medicare HMO | Attending: Hematology

## 2019-07-07 ENCOUNTER — Other Ambulatory Visit: Payer: Self-pay

## 2019-07-07 ENCOUNTER — Other Ambulatory Visit: Payer: Self-pay | Admitting: Hematology

## 2019-07-07 VITALS — BP 160/88 | HR 67 | Temp 98.3°F | Resp 16

## 2019-07-07 DIAGNOSIS — M545 Low back pain: Secondary | ICD-10-CM | POA: Insufficient documentation

## 2019-07-07 DIAGNOSIS — C9000 Multiple myeloma not having achieved remission: Secondary | ICD-10-CM | POA: Diagnosis not present

## 2019-07-07 DIAGNOSIS — M7989 Other specified soft tissue disorders: Secondary | ICD-10-CM | POA: Diagnosis not present

## 2019-07-07 DIAGNOSIS — Z79899 Other long term (current) drug therapy: Secondary | ICD-10-CM | POA: Insufficient documentation

## 2019-07-07 DIAGNOSIS — I700 Atherosclerosis of aorta: Secondary | ICD-10-CM | POA: Insufficient documentation

## 2019-07-07 DIAGNOSIS — M8580 Other specified disorders of bone density and structure, unspecified site: Secondary | ICD-10-CM | POA: Insufficient documentation

## 2019-07-07 DIAGNOSIS — Z7982 Long term (current) use of aspirin: Secondary | ICD-10-CM | POA: Insufficient documentation

## 2019-07-07 DIAGNOSIS — I10 Essential (primary) hypertension: Secondary | ICD-10-CM | POA: Insufficient documentation

## 2019-07-07 DIAGNOSIS — Z87891 Personal history of nicotine dependence: Secondary | ICD-10-CM | POA: Insufficient documentation

## 2019-07-07 DIAGNOSIS — M25473 Effusion, unspecified ankle: Secondary | ICD-10-CM | POA: Diagnosis not present

## 2019-07-07 DIAGNOSIS — Z5111 Encounter for antineoplastic chemotherapy: Secondary | ICD-10-CM | POA: Diagnosis present

## 2019-07-07 DIAGNOSIS — E041 Nontoxic single thyroid nodule: Secondary | ICD-10-CM | POA: Diagnosis not present

## 2019-07-07 DIAGNOSIS — Z7189 Other specified counseling: Secondary | ICD-10-CM

## 2019-07-07 MED ORDER — BORTEZOMIB CHEMO SQ INJECTION 3.5 MG (2.5MG/ML)
1.3000 mg/m2 | Freq: Once | INTRAMUSCULAR | Status: AC
  Administered 2019-07-07: 2 mg via SUBCUTANEOUS
  Filled 2019-07-07: qty 0.8

## 2019-07-07 MED ORDER — PROCHLORPERAZINE MALEATE 10 MG PO TABS
ORAL_TABLET | ORAL | Status: AC
  Filled 2019-07-07: qty 1

## 2019-07-07 MED ORDER — PROCHLORPERAZINE MALEATE 10 MG PO TABS
10.0000 mg | ORAL_TABLET | Freq: Once | ORAL | Status: AC
  Administered 2019-07-07: 10 mg via ORAL

## 2019-07-10 ENCOUNTER — Ambulatory Visit: Payer: Medicare HMO

## 2019-07-13 ENCOUNTER — Other Ambulatory Visit: Payer: Self-pay | Admitting: *Deleted

## 2019-07-13 ENCOUNTER — Other Ambulatory Visit: Payer: Self-pay | Admitting: Radiology

## 2019-07-13 MED ORDER — OXYCODONE-ACETAMINOPHEN 5-325 MG PO TABS: 1.0000 | ORAL_TABLET | ORAL | 0 refills | Status: DC | PRN

## 2019-07-13 NOTE — Telephone Encounter (Signed)
Patient requested refill of Oxycodone

## 2019-07-14 ENCOUNTER — Other Ambulatory Visit: Payer: Self-pay

## 2019-07-14 ENCOUNTER — Ambulatory Visit (HOSPITAL_COMMUNITY)
Admission: RE | Admit: 2019-07-14 | Discharge: 2019-07-14 | Disposition: A | Discharge status: Home / Self Care | Payer: Medicare HMO | Source: Ambulatory Visit | Attending: Hematology | Admitting: Hematology

## 2019-07-14 ENCOUNTER — Encounter (HOSPITAL_COMMUNITY): Payer: Self-pay

## 2019-07-14 DIAGNOSIS — D72820 Lymphocytosis (symptomatic): Secondary | ICD-10-CM | POA: Insufficient documentation

## 2019-07-14 DIAGNOSIS — Z880 Allergy status to penicillin: Secondary | ICD-10-CM | POA: Insufficient documentation

## 2019-07-14 DIAGNOSIS — Z79899 Other long term (current) drug therapy: Secondary | ICD-10-CM | POA: Diagnosis not present

## 2019-07-14 DIAGNOSIS — Z882 Allergy status to sulfonamides status: Secondary | ICD-10-CM | POA: Diagnosis not present

## 2019-07-14 DIAGNOSIS — C9000 Multiple myeloma not having achieved remission: Secondary | ICD-10-CM | POA: Insufficient documentation

## 2019-07-14 DIAGNOSIS — D61818 Other pancytopenia: Secondary | ICD-10-CM | POA: Diagnosis not present

## 2019-07-14 DIAGNOSIS — Z888 Allergy status to other drugs, medicaments and biological substances status: Secondary | ICD-10-CM | POA: Diagnosis not present

## 2019-07-14 DIAGNOSIS — Z7982 Long term (current) use of aspirin: Secondary | ICD-10-CM | POA: Diagnosis not present

## 2019-07-14 LAB — CBC WITH DIFFERENTIAL/PLATELET
Abs Immature Granulocytes: 0.02 10*3/uL (ref 0.00–0.07)
Basophils Absolute: 0 10*3/uL (ref 0.0–0.1)
Basophils Relative: 1 %
Eosinophils Absolute: 0.1 10*3/uL (ref 0.0–0.5)
Eosinophils Relative: 2 %
HCT: 34 % — ABNORMAL LOW (ref 36.0–46.0)
Hemoglobin: 11.1 g/dL — ABNORMAL LOW (ref 12.0–15.0)
Immature Granulocytes: 1 %
Lymphocytes Relative: 5 %
Lymphs Abs: 0.2 10*3/uL — ABNORMAL LOW (ref 0.7–4.0)
MCH: 35.2 pg — ABNORMAL HIGH (ref 26.0–34.0)
MCHC: 32.6 g/dL (ref 30.0–36.0)
MCV: 107.9 fL — ABNORMAL HIGH (ref 80.0–100.0)
Monocytes Absolute: 0.5 10*3/uL (ref 0.1–1.0)
Monocytes Relative: 13 %
Neutro Abs: 2.8 10*3/uL (ref 1.7–7.7)
Neutrophils Relative %: 78 %
Platelets: 253 10*3/uL (ref 150–400)
RBC: 3.15 MIL/uL — ABNORMAL LOW (ref 3.87–5.11)
RDW: 15.1 % (ref 11.5–15.5)
WBC: 3.6 10*3/uL — ABNORMAL LOW (ref 4.0–10.5)
nRBC: 0 % (ref 0.0–0.2)

## 2019-07-14 LAB — PROTIME-INR
INR: 0.9 (ref 0.8–1.2)
Prothrombin Time: 11.8 seconds (ref 11.4–15.2)

## 2019-07-14 MED ORDER — MIDAZOLAM HCL 2 MG/2ML IJ SOLN
INTRAMUSCULAR | Status: AC | PRN
  Administered 2019-07-14 (×2): 1 mg via INTRAVENOUS

## 2019-07-14 MED ORDER — FENTANYL CITRATE (PF) 100 MCG/2ML IJ SOLN
INTRAMUSCULAR | Status: AC | PRN
  Administered 2019-07-14 (×2): 50 ug via INTRAVENOUS

## 2019-07-14 MED ORDER — MIDAZOLAM HCL 2 MG/2ML IJ SOLN
INTRAMUSCULAR | Status: AC
  Filled 2019-07-14: qty 4

## 2019-07-14 MED ORDER — SODIUM CHLORIDE 0.9 % IV SOLN
INTRAVENOUS | Status: DC
  Administered 2019-07-14: 08:00:00 via INTRAVENOUS

## 2019-07-14 MED ORDER — FENTANYL CITRATE (PF) 100 MCG/2ML IJ SOLN
INTRAMUSCULAR | Status: AC
  Filled 2019-07-14: qty 2

## 2019-07-14 MED ORDER — LIDOCAINE HCL (PF) 1 % IJ SOLN
INTRAMUSCULAR | Status: AC | PRN
  Administered 2019-07-14: 10 mL

## 2019-07-14 NOTE — H&P (Signed)
Referring Physician(s): [name]  Supervising Physician: Jacqulynn Cadet  Patient Status:  Megan Olson  Chief Complaint:  "I'm having another bone marrow biopsy"  Subjective: Patient familiar to IR service from bone marrow biopsy on 12/01/2018 as well as kyphoplasty consultation on 12/13/2018.  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  She currently denies fever, headache, chest pain, dyspnea, cough, abdominal/back pain, nausea, vomiting or bleeding.  She does c/o of some abdominal fullness.  Past Medical History:  Diagnosis Date   Cancer [facility])    multiple myeloma   Glaucoma    HYPERTENSION 03/11/2007   OSTEOPENIA 03/11/2007      Allergies: Ace inhibitors, Benadryl [diphenhydramine], Diamox [acetazolamide], Sulfamethoxazole, Lenalidomide, and Penicillins  Medications: Prior to Admission medications   Medication Sig Start Date End Date Taking? Authorizing Provider  acyclovir (ZOVIRAX) 400 MG tablet Take 1 tablet (400 mg total) by mouth 2 (two) times daily. 04/11/19  Yes [name], MD  ALPHAGAN P 0.1 % SOLN Apply 1 drop topically See admin instructions. Apply 2 drops in right eye 3 times a day 02/02/17  Yes [provider]  amLODipine (NORVASC) 5 MG tablet Take 1 tablet (5 mg total) by mouth daily. 04/11/19  Yes [name], MD  aspirin EC 81 MG tablet Take 81 mg by mouth daily.   Yes [provider]  bimatoprost (LUMIGAN) 0.03 % ophthalmic solution Place 1 drop into the right eye at bedtime.    Yes [provider]  Cholecalciferol (VITAMIN D3) 1000 UNITS CAPS Take 1,000 Units by mouth 2 (two) times daily.    Yes [provider]  co-enzyme Q-10 50 MG capsule Take 50 mg by mouth daily.     Yes [provider]  cyclophosphamide (CYTOXAN) 50 MG capsule TAKE 9 CAPSULES (450 MG TOTAL) BY  MOUTH ONCE A WEEK. TAKE WITH FOOD TO MINIMIZE GI UPSET. TAKE EARLY IN THE DAY AND MAINTAIN HYDRATION. 05/24/19  Yes [name], MD  dexamethasone (DECADRON) 4 MG tablet 79m (5 tabs) on Day 15 of each cycle of treatment 01/31/19  Yes KBrunetta Genera MD  dorzolamide-timolol (COSOPT) 22.3-6.8 MG/ML ophthalmic solution Place 2 drops into both eyes 2 (two) times daily.  03/01/17  Yes [provider]  fentaNYL (DURAGESIC) 25 MCG/HR Place 1 patch onto the skin every 3 (three) days. 06/15/19  Yes KBrunetta Genera MD  Magnesium Oxide 500 MG TABS Take 1 tablet by mouth daily.     Yes [provider]  Multiple Vitamins-Minerals (MULTIVITAMIN WITH MINERALS) tablet Take 1 tablet by mouth daily.     Yes [provider]  oxyCODONE-acetaminophen (PERCOCET) 5-325 MG tablet Take 1-2 tablets by mouth every 4 (four) hours as needed for moderate pain or severe pain. 07/13/19  Yes KBrunetta Genera MD  polyethylene glycol ([facility] packet Take 17 g by mouth daily. 12/12/18  Yes KBrunetta Genera MD  prochlorperazine (COMPAZINE) 10 MG tablet Take 1 tablet (10 mg total) by mouth every 6 (six) hours as needed (Nausea or vomiting). 12/12/18  Yes KBrunetta Genera MD  senna-docusate (SENOKOT-S) 8.6-50 MG tablet Take 2 tablets by mouth at bedtime. 12/12/18  Yes KBrunetta Genera MD  vitamin C (ASCORBIC ACID) 500 MG tablet Take 500 mg by mouth daily. 02/02/19  Yes [provider]  hydrOXYzine (ATARAX/VISTARIL) 25 MG tablet Take 1 tablet (25 mg total)  by mouth 3 (three) times daily as needed. 01/10/19   [name], MD  magnesium hydroxide (MILK OF MAGNESIA) 400 MG/5ML suspension Take 15 mLs by mouth daily as needed for mild constipation or moderate constipation. 12/19/18   Long, Wonda Olds, MD  ondansetron (ZOFRAN) 8 MG tablet Take 1 tablet (8 mg total) by mouth 2 (two) times daily as needed (Nausea or vomiting). 12/12/18   [name], MD     Vital  Signs: BP (!) 159/94   Physical Exam awake, alert.  Chest clear to auscultation bilaterally.  Heart with slightly bradycardic but regular rhythm.  Abdomen soft, slightly protuberant, bowel sounds, nontender.  No lower extremity edema.  Imaging: No results found.  Labs:  CBC: Recent Labs    06/13/19 0757 06/27/19 0833 07/04/19 0803 07/14/19 0733  WBC 2.7* 4.5 2.7* 3.6*  HGB 9.8* 9.8* 9.8* 11.1*  HCT 30.1* 30.0* 30.3* 34.0*  PLT 195 321 167 253    COAGS: Recent Labs    12/01/18 0757 07/14/19 0733  INR 1.1 0.9    BMP: Recent Labs    06/06/19 0838 06/13/19 0757 06/27/19 0833 07/04/19 0803  NA 137 139 140 139  K 4.4 4.2 4.2 4.1  CL 104 103 103 104  CO2 _0 GLUCOSE 133* 108* 100* 109*  BUN _1 CALCIUM 9.0 8.7* 8.9 8.5*  CREATININE 0.63 0.62 0.61 0.61  GFRNONAA >60 >60 >60 >60  GFRAA >60 >60 >60 >60    LIVER FUNCTION TESTS: Recent Labs    06/06/19 0838 06/13/19 0757 06/27/19 0833 07/04/19 0803  BILITOT 0.5 0.4 0.4 0.3  AST 15 13* 15 15  ALT _2 ALKPHOS 39 31* 36* 35*  PROT 6.4* 5.6* 5.9* 5.8*  ALBUMIN 4.0 3.8 3.9 3.9    Assessment and Plan: Pt with history of multiple myeloma as well as monoclonal B-cell lymphocytosis ; presents today for CT-guided bone marrow biopsy 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  All of the questions were answered and there is agreement to proceed.  Consent signed and in chart.     Electronically Signed: D. Rowe Robert, PA-C 07/14/2019, 8:21 AM   I spent a total of 20 minutes at the the patient's bedside AND on the patient's hospital floor or unit, greater than 50% of which was counseling/coordinating care for CT-guided bone marrow biopsy

## 2019-07-14 NOTE — Procedures (Signed)
Interventional Radiology Procedure Note  Procedure: CT guided aspirate and core biopsy of right iliac bone Complications: None Recommendations: - Bedrest supine x 1 hrs - Hydrocodone PRN  Pain - Follow biopsy results  Signed,  Errika Narvaiz K. Celestino Ackerman, MD   

## 2019-07-14 NOTE — Discharge Instructions (Signed)
Please call with any questions or concern at [phone]; For after hours , call [phone] and ask for on call MD  Bone Marrow Aspiration and Bone Marrow Biopsy, Adult, Care After This sheet gives you information about how to care for yourself after your procedure. Your health care provider may also give you more specific instructions. If you have problems or questions, contact your health care provider. What can I expect after the procedure? After the procedure, it is common to have:  Mild pain and tenderness.  Swelling.  Bruising. Follow these instructions at home: Puncture site care      Follow instructions from your health care provider about how to take care of the puncture site. Make sure you: ? Wash your hands with soap and water before you change your bandage (dressing). If soap and water are not available, use hand sanitizer. ? Change your dressing as told by your health care provider.  Check your puncture siteevery day for signs of infection. Check for: ? More redness, swelling, or pain. ? More fluid or blood. ? Warmth. ? Pus or a bad smell. General instructions  Take over-the-counter and prescription medicines only as told by your health care provider.  Do not take baths, swim, or use a hot tub until your health care provider approves. Ask if you can take a shower or have a sponge bath.  Return to your normal activities as told by your health care provider. Ask your health care provider what activities are safe for you.  Do not drive for 24 hours if you were given a medicine to help you relax (sedative) during your procedure.  Keep all follow-up visits as told by your health care provider. This is important. Contact a health care provider if:  Your pain is not controlled with medicine. Get help right away if:  You have a fever.  You have more redness, swelling, or pain around the puncture site.  You have more fluid or blood coming from the puncture  site.  Your puncture site feels warm to the touch.  You have pus or a bad smell coming from the puncture site. These symptoms may represent a serious problem that is an emergency. Do not wait to see if the symptoms will go away. Get medical help right away. Call your local emergency services (911 in the [place] Do not drive yourself to the hospital. Summary  After the procedure, it is common to have mild pain, tenderness, swelling, and bruising.  Follow instructions from your health care provider about how to take care of the puncture site.  Get help right away if you have any symptoms of infection or if you have more blood or fluid coming from the puncture site. This information is not intended to replace advice given to you by your health care provider. Make sure you discuss any questions you have with your health care provider. Document Released: 04/10/2005 Document Revised: 01/04/2018 Document Reviewed: 03/04/2016 Elsevier Patient Education  [employer].   Moderate Conscious Sedation, Adult, Care After These instructions provide you with information about caring for yourself after your procedure. Your health care provider may also give you more specific instructions. Your treatment has been planned according to current medical practices, but problems sometimes occur. Call your health care provider if you have any problems or questions after your procedure. What can I expect after the procedure? After your procedure, it is common:  To feel sleepy for several hours.  To feel clumsy and have poor  balance for several hours.  To have poor judgment for several hours.  To vomit if you eat too soon. Follow these instructions at home: For at least 24 hours after the procedure:   Do not: ? Participate in activities where you could fall or become injured. ? Drive. ? Use heavy machinery. ? Drink alcohol. ? Take sleeping pills or medicines that cause drowsiness. ? Make important  decisions or sign legal documents. ? Take care of children on your own.  Rest. Eating and drinking  Follow the diet recommended by your health care provider.  If you vomit: ? Drink water, juice, or soup when you can drink without vomiting. ? Make sure you have little or no nausea before eating solid foods. General instructions  Have a responsible adult stay with you until you are awake and alert.  Take over-the-counter and prescription medicines only as told by your health care provider.  If you smoke, do not smoke without supervision.  Keep all follow-up visits as told by your health care provider. This is important. Contact a health care provider if:  You keep feeling nauseous or you keep vomiting.  You feel light-headed.  You develop a rash.  You have a fever. Get help right away if:  You have trouble breathing. This information is not intended to replace advice given to you by your health care provider. Make sure you discuss any questions you have with your health care provider. Document Released: 07/12/2013 Document Revised: 09/03/2017 Document Reviewed: 01/11/2016 Elsevier Patient Education  2020 [employer].

## 2019-07-16 NOTE — Progress Notes (Signed)
HEMATOLOGY/ONCOLOGY CLINIC NOTE  Date of Service: 07/18/2019  Patient Care Team: [name], MD as PCP - General (Family Medicine) [name], MD as Consulting Physician (Hematology) Bond, [name], MD as Referring Physician (Ophthalmology)  Dr. Ronney Lion as [place] Specialist  [phone]  CHIEF COMPLAINTS/PURPOSE OF CONSULTATION:   Multiple Myeloma- continued management  HISTORY OF PRESENTING ILLNESS:   Megan Olson is a wonderful 78 y.o. female who has been referred to [place] by Dr. 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 She is accompanied today by her husband. The pt reports that she is doing well overall.   The pt notes that she was doing extensive yard work in October 2019, and began to feel back pain a few days after this, which she attributed to muscle pain. She recalls taking a deep breath, and developing sudden acute pain, and presented to care with her PCP's office. She began exercises for her back pain, without relief, then began PT without relief, then was referred to Dr. Paulla Fore in sports medicine in late January 2020. She had an XR which revealed a compression fracture at T11, then subsequently had an MRI, and a bone marrow biopsy. The pt notes that her back pain "seemed to move around." She endorses pain "up the whole left side" of her back.  The pt notes that she is not able to stand up straight due to her back pain, which she feels limits her ability to take a deep breath, and endorses pain exacerbation when she does take a deep breath. The pt needs assistance from sitting to lying from her husband. She is using about 3 Percocet a day.  The pt notes worsened constipation since beginning Percocet, and notes that her most recent laxative order was not covered by her insurance. She is using prune juice and milk of magnesia. She took Senokot S BID without relief.  The pt reports that  prior to her recent back pain, she had very few medical concerns. She endorses controlled BP, and has been monitored for DM but after closely watching her diet her A1C decreased to 5.8. She has glaucoma, and has had surgery in both eyes. Right eye with stent. The pt sees Dr. Edilia Bo at [facility] for her eye care. The pt notes that her vision has been recently "pretty good." The pt notes that she has been advised to limit treatment with steroids for her glaucoma. She denies heart or lung problems. Denies previous back problems. Last DEXA scan 3 years ago, and endorses osteopenia. She notes that she took Fosamax for 3-4 years, and stopped 5-6 years ago. She takes Vitamin D, a multivitamin, and magnesium.  The pt notes that she quit smoking cigarettes when she was 27, started when she was about 20. The pt consumes alcohol rarely, but not since beginning narcotics. She previously worked in [employer].  Of note prior to the patient's visit today, pt has had an MRI Thoracic Spine completed on 11/27/18 with results revealing Multifocal marrow signal abnormality consistent with metastatic disease or multiple myeloma. The patient has multiple compression fractures most consistent with pathologic injuries. Extensive marrow signal abnormality makes determining age of the fractures difficult but edema is most intense in T3. 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  Most recent lab results (12/01/18) of CBC w/diff is as follows: all  values are WNL except for RBC at 3.17, HGB at 10.4, HCT at 33.5, MCV at 105.7. 11/28/18 CMP revealed all values WNL except for Glucose at 105, Total Protein at 10.2, Albumin at 3.1 11/30/18 24HR UPEP revealed all values WNL except for M-spike at 75.1%. 11/28/18 Bega-2 microglobulin slightly elevated at 2.6 11/28/18 SPEP revealed M spike at 4.6g 11/28/18 Immunoglobulins revealed IgG at 6181, IgA at 24,  IgM at 16, and IgE at 6.  On review of systems, pt reports constipation, back pain, stable energy levels, ankle swelling, tenderness at T3, lower back pain, and denies abdominal pains, neck pain, and any other symptoms.   On PMHx the pt reports redundant colon, single hemorrhoid, glaucoma, HTN, osteopenia, Tonsillectomy, right eye stent and multiple eye surgeries. On Social Hx the pt reports rare alcohol use, smoked cigarettes between ages 52 and 62. 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 On Family Hx the pt reports sister died from small cell lung cancer and was a lifelong smoker, brother's daughter died of breast cancer with BRCA1 and BRCA2 mutations. Cousin with female breast cancer.   INTERVAL HISTORY:   Megan Olson returns today for management and evaluation of her multiple myeloma and C11D1 of dexamethazone, bortezomib, cytoxan and denosumab at this time. The patient's last visit with [place] was on 06/27/2019. The pt reports that she is doing well overall.  The pt reports that she is ready to speak with Dr. Norma Fredrickson at [facility] but has not set up an appointment with him yet. Pt is taking 2x 1000 IU Vitamin D per day as well as a daily multivitamin. She is interested in taking additional Vitamin D. Pt is interested in wearing a back brace to help with her pain. She saw a Sports Medicine physician earlier this year and can easily set another appointment with them. 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   Of note since the patient's last visit, pt has had PET/CT Whole Body Scan ([id number]) completed on 07/17/2019 with results revealing "1. 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 2. Hypermetabolic calcified left thyroid nodule, as before, indeterminate. 3. Aortic atherosclerosis (ICD10-170.0). Coronary artery calcification."  Lab results today (07/18/19) of CBC w/diff and CMP is as follows: all  values are WNL except for WBC at 3.1K, RBC at 2.77, Hgb at 9.8, HCT at 29.0, MCV at 104.7, MCH at 35.4, Lymphs Abs at 0.1K, Glucose at 102, Calcium at 8.7, Total Protein at 5.9, AST at 12, Alkaline Phosphatase at 33.  On review of systems, pt reports back pain, denies abdominal pain and any other symptoms.   MEDICAL HISTORY:  Past Medical History:  Diagnosis Date   Cancer [facility])    multiple myeloma   Glaucoma    HYPERTENSION 03/11/2007   OSTEOPENIA 03/11/2007    SURGICAL HISTORY: Past Surgical History:  Procedure Laterality Date   BREAST EXCISIONAL BIOPSY Right 2000   BREAST LUMPECTOMY  1990   benign   DILATION AND CURETTAGE OF UTERUS     bleeding at menopause. No uterine cancer   IR RADIOLOGIST EVAL & MGMT  12/13/2018   TONSILLECTOMY     age 54    SOCIAL HISTORY: Social History   Socioeconomic History   Marital status: Married    Spouse name: Not on file   Number of children: Not on file   Years of education: Not on file   Highest education level: Not on  file  Occupational History   Not on file  Social Needs   Financial resource strain: Not on file   Food insecurity    Worry: Not on file    Inability: Not on file   Transportation needs    Medical: Not on file    Non-medical: Not on file  Tobacco Use   Smoking status: Former Smoker    Packs/day: 0.50    Years: 7.00    Pack years: 3.50    Types: Cigarettes    Quit date: 01/04/1961    Years since quitting: 58.5   Smokeless tobacco: Never Used  Substance and Sexual Activity   Alcohol use: Yes    Alcohol/week: 1.0 standard drinks    Types: 1 Standard drinks or equivalent per week   Drug use: No   Sexual activity: Not Currently  Lifestyle   Physical activity    Days per week: Not on file    Minutes per session: Not on file   Stress: Not on file  Relationships   Social connections    Talks on phone: Not on file    Gets together: Not on file    Attends religious service: Not on file     Active member of club or organization: Not on file    Attends meetings of clubs or organizations: Not on file    Relationship status: Not on file   Intimate partner violence    Fear of current or ex partner: Not on file    Emotionally abused: Not on file    Physically abused: Not on file    Forced sexual activity: Not on file  Other Topics Concern   Not on file  Social History Narrative   Married. Lives with husband (patient of Dr. Yong Channel). 1 son. No grandkids. 1 granddog.       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, swing dancing, dinner, read       FAMILY HISTORY: Family History  Problem Relation Age of Onset   Heart disease Mother        CHF mother died 96   Arthritis Mother    Glaucoma Mother        sister as well   Alcohol abuse Father    Suicidality Father    Cancer Sister        lung cancer, smoker   Heart disease Sister        aortic valve replacement   Hyperlipidemia Brother    Hypertension Brother    COPD Brother    Arthritis Sister    Hypertension Sister    Glaucoma Sister    Hashimoto's thyroiditis Sister    Hypertension Son    Stroke Maternal Grandmother    Colon cancer Neg Hx    Colon polyps Neg Hx     ALLERGIES:  is allergic to ace inhibitors; benadryl [diphenhydramine]; diamox [acetazolamide]; sulfamethoxazole; lenalidomide; and penicillins.   MEDICATIONS:  Current Outpatient Medications  Medication Sig Dispense Refill   acyclovir (ZOVIRAX) 400 MG tablet Take 1 tablet (400 mg total) by mouth 2 (two) times daily. 60 tablet 3   ALPHAGAN P 0.1 % SOLN Apply 1 drop topically See admin instructions. Apply 2 drops in right eye 3 times a day     amLODipine (NORVASC) 5 MG tablet Take 1 tablet (5 mg total) by mouth daily. 90 tablet 3   aspirin EC 81 MG tablet Take 81  mg by mouth daily.     bimatoprost (LUMIGAN) 0.03 % ophthalmic solution Place 1  drop into the right eye at bedtime.      Cholecalciferol (VITAMIN D3) 1000 UNITS CAPS Take 1,000 Units by mouth 2 (two) times daily.      co-enzyme Q-10 50 MG capsule Take 50 mg by mouth daily.       cyclophosphamide (CYTOXAN) 50 MG capsule TAKE 9 CAPSULES (450 MG TOTAL) BY MOUTH ONCE A WEEK. TAKE WITH FOOD TO MINIMIZE GI UPSET. TAKE EARLY IN THE DAY AND MAINTAIN HYDRATION. 36 capsule 2   dexamethasone (DECADRON) 4 MG tablet '20mg'$  (5 tabs) on Day 15 of each cycle of treatment 15 tablet 3   dorzolamide-timolol (COSOPT) 22.3-6.8 MG/ML ophthalmic solution Place 2 drops into both eyes 2 (two) times daily.   11   fentaNYL (DURAGESIC) 25 MCG/HR Place 1 patch onto the skin every 3 (three) days. 10 patch 0   hydrOXYzine (ATARAX/VISTARIL) 25 MG tablet Take 1 tablet (25 mg total) by mouth 3 (three) times daily as needed. 30 tablet 0   magnesium hydroxide (MILK OF MAGNESIA) 400 MG/5ML suspension Take 15 mLs by mouth daily as needed for mild constipation or moderate constipation. 355 mL 0   Magnesium Oxide 500 MG TABS Take 1 tablet by mouth daily.       Multiple Vitamins-Minerals (MULTIVITAMIN WITH MINERALS) tablet Take 1 tablet by mouth daily.       ondansetron (ZOFRAN) 8 MG tablet Take 1 tablet (8 mg total) by mouth 2 (two) times daily as needed (Nausea or vomiting). 30 tablet 1   oxyCODONE-acetaminophen (PERCOCET) 5-325 MG tablet Take 1-2 tablets by mouth every 4 (four) hours as needed for moderate pain or severe pain. 90 tablet 0   polyethylene glycol (MIRALAX) packet Take 17 g by mouth daily. 30 each 1   prochlorperazine (COMPAZINE) 10 MG tablet Take 1 tablet (10 mg total) by mouth every 6 (six) hours as needed (Nausea or vomiting). 30 tablet 1   senna-docusate (SENOKOT-S) 8.6-50 MG tablet Take 2 tablets by mouth at bedtime. 60 tablet 2   vitamin C (ASCORBIC ACID) 500 MG tablet Take 500 mg by mouth daily.     No current facility-administered medications for this visit.     REVIEW OF  SYSTEMS:    A 10+ POINT REVIEW OF SYSTEMS WAS OBTAINED including neurology, dermatology, psychiatry, cardiac, respiratory, lymph, extremities, GI, GU, Musculoskeletal, constitutional, breasts, reproductive, HEENT.  All pertinent positives are noted in the HPI.  All others are negative.   PHYSICAL EXAMINATION: ECOG PERFORMANCE STATUS: 1-2  Vitals:   07/18/19 0855  BP: (!) 150/86  Pulse: 72  Resp: 17  Temp: 98.7 F (37.1 C)  SpO2: 98%   Filed Weights   07/18/19 0855  Weight: 103 lb 11.2 oz (47 kg)   Body mass index is 18.66 kg/m.  Exam was given in a wheelchair   GENERAL:alert, in no acute distress and comfortable SKIN: no acute rashes, no significant lesions EYES: conjunctiva are pink and non-injected, sclera anicteric OROPHARYNX: MMM, no exudates, no oropharyngeal erythema or ulceration NECK: supple, no JVD LYMPH:  no palpable lymphadenopathy in the cervical, axillary or inguinal regions LUNGS: clear to auscultation b/l with normal respiratory effort HEART: regular rate & rhythm ABDOMEN:  normoactive bowel sounds , non tender, not distended. No palpable hepatosplenomegaly.  Extremity: no pedal edema PSYCH: alert & oriented x 3 with fluent speech NEURO: no focal motor/sensory deficits  LABORATORY DATA:  I have reviewed  the data as listed  . CBC Latest Ref Rng & Units 07/18/2019 07/14/2019 07/04/2019  WBC 4.0 - 10.5 K/uL 3.1(L) 3.6(L) 2.7(L)  Hemoglobin 12.0 - 15.0 g/dL 9.8(L) 11.1(L) 9.8(L)  Hematocrit 36.0 - 46.0 % 29.0(L) 34.0(L) 30.3(L)  Platelets 150 - 400 K/uL 323 253 167    . CMP Latest Ref Rng & Units 07/18/2019 07/04/2019 06/27/2019  Glucose 70 - 99 mg/dL 102(H) 109(H) 100(H)  BUN 8 - 23 mg/dL '16 14 16  '$ Creatinine 0.44 - 1.00 mg/dL 0.62 0.61 0.61  Sodium 135 - 145 mmol/L 141 139 140  Potassium 3.5 - 5.1 mmol/L 4.3 4.1 4.2  Chloride 98 - 111 mmol/L 103 104 103  CO2 22 - 32 mmol/L '29 28 29  '$ Calcium 8.9 - 10.3 mg/dL 8.7(L) 8.5(L) 8.9  Total Protein 6.5 - 8.1  g/dL 5.9(L) 5.8(L) 5.9(L)  Total Bilirubin 0.3 - 1.2 mg/dL 0.4 0.3 0.4  Alkaline Phos 38 - 126 U/L 33(L) 35(L) 36(L)  AST 15 - 41 U/L 12(L) 15 15  ALT 0 - 44 U/L '14 16 16   '$ Most recent MMP 04/25/2019 Component     Latest Ref Rng & Units 04/25/2019  IgG (Immunoglobin G), Serum     586 - 1,602 mg/dL 702  IgA     64 - 422 mg/dL 39 (L)  IgM (Immunoglobulin M), Srm     26 - 217 mg/dL 26  Total Protein ELP     6.0 - 8.5 g/dL 5.9 (L)  Albumin SerPl Elph-Mcnc     2.9 - 4.4 g/dL 3.8  Alpha 1     0.0 - 0.4 g/dL 0.2  Alpha2 Glob SerPl Elph-Mcnc     0.4 - 1.0 g/dL 0.7  B-Globulin SerPl Elph-Mcnc     0.7 - 1.3 g/dL 1.0  Gamma Glob SerPl Elph-Mcnc     0.4 - 1.8 g/dL 0.3 (L)  M Protein SerPl Elph-Mcnc     Not Observed g/dL 0.4 (H)  Globulin, Total     2.2 - 3.9 g/dL 2.1 (L)  Albumin/Glob SerPl     0.7 - 1.7 1.9 (H)  IFE 1      Comment  Please Note (HCV):      Comment    12/01/18 BM Bx:   12/01/18 Flow Cytometry:       RADIOGRAPHIC STUDIES: I have personally reviewed the radiological images as listed and agreed with the findings in the report. Nm Pet Image Restage (ps) Whole Body  Result Date: 07/17/2019 CLINICAL DATA:  Subsequent treatment strategy for multiple myeloma. EXAM: NUCLEAR MEDICINE PET WHOLE BODY TECHNIQUE: 5.8 mCi F-18 FDG was injected intravenously. Full-ring PET imaging was performed from the skull base to thigh after the radiotracer. CT data was obtained and used for attenuation correction and anatomic localization. Fasting blood glucose: 112 mg/dl COMPARISON:  12/22/2018 and CT abdomen pelvis 12/19/2018. FINDINGS: Mediastinal blood pool activity: SUV max 2.0 HEAD/NECK: No abnormal hypermetabolism. Incidental CT findings: None. CHEST: Persistent hypermetabolism in a partially calcified 1.8 cm left thyroid nodule, SUV max 4.5. No hypermetabolic mediastinal, hilar or axillary lymph nodes. No hypermetabolic pulmonary nodules. Incidental CT findings: Atherosclerotic  calcification of the aorta and coronary arteries. Heart is mildly enlarged. No pericardial or pleural effusion. ABDOMEN/PELVIS: No hypermetabolic lymph nodes. Incidental CT findings: 9 mm low-attenuation lesion in the periphery of the left hepatic lobe is too small to characterize. Liver, gallbladder and adrenal glands are unremarkable. 11 mm hyperdense lesion in the lower pole right kidney is too small to  characterize but a hyperdense cyst is likely. Left kidney, spleen pancreas, stomach and bowel are grossly unremarkable. Atherosclerotic calcification of the aorta. SKELETON: No abnormal osseous hypermetabolism. Incidental CT findings: Multiple compression deformities in the spine are better seen on prior diagnostic cross-sectional imaging. EXTREMITIES: No abnormal hypermetabolism. Incidental CT findings: None. IMPRESSION: 1. 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 2. Hypermetabolic calcified left thyroid nodule, as before, indeterminate. 3. Aortic atherosclerosis (ICD10-170.0). Coronary artery calcification. Electronically Signed   By: Lorin Picket M.D.   On: 07/17/2019 15:47   Ct Biopsy  Result Date: 07/14/2019 INDICATION: 78 year old female with a history of multiple myeloma status post induction therapy. Evaluate prior to bone marrow transplantation. EXAM: CT GUIDED BONE MARROW ASPIRATION AND CORE BIOPSY Interventional Radiologist:  Criselda Peaches, MD MEDICATIONS: None. ANESTHESIA/SEDATION: Moderate (conscious) sedation was employed during this procedure. A total of 2 milligrams versed and 100 micrograms fentanyl were administered intravenously. The patient's level of consciousness and vital signs were monitored continuously by radiology nursing throughout the procedure under my direct supervision. Total monitored sedation time: 10 minutes FLUOROSCOPY TIME:  None. COMPLICATIONS: None immediate. Estimated blood loss: <25 mL PROCEDURE: Informed  written consent was obtained from the patient after a thorough discussion of the procedural risks, benefits and alternatives. All questions were addressed. Maximal Sterile Barrier Technique was utilized including caps, mask, sterile gowns, sterile gloves, sterile drape, hand hygiene and skin antiseptic. A timeout was performed prior to the initiation of the procedure. The patient was positioned prone and non-contrast localization CT was performed of the pelvis to demonstrate the iliac marrow spaces. Maximal barrier sterile technique utilized including caps, mask, sterile gowns, sterile gloves, large sterile drape, hand hygiene, and betadine prep. Under sterile conditions and local anesthesia, an 11 gauge coaxial bone biopsy needle was advanced into the right iliac marrow space. Needle position was confirmed with CT imaging. Initially, bone marrow aspiration was performed. Next, the 11 gauge outer cannula was utilized to obtain a right iliac bone marrow core biopsy. Needle was removed. Hemostasis was obtained with compression. The patient tolerated the procedure well. Samples were prepared with the cytotechnologist. IMPRESSION: Technically successful CT-guided bone marrow biopsy and aspirate of the right iliac bone. Electronically Signed   By: Jacqulynn Cadet M.D.   On: 07/14/2019 13:16   Ct Bone Marrow Biopsy & Aspiration  Result Date: 07/14/2019 INDICATION: 78 year old female with a history of multiple myeloma status post induction therapy. Evaluate prior to bone marrow transplantation. EXAM: CT GUIDED BONE MARROW ASPIRATION AND CORE BIOPSY Interventional Radiologist:  Criselda Peaches, MD MEDICATIONS: None. ANESTHESIA/SEDATION: Moderate (conscious) sedation was employed during this procedure. A total of 2 milligrams versed and 100 micrograms fentanyl were administered intravenously. The patient's level of consciousness and vital signs were monitored continuously by radiology nursing throughout the  procedure under my direct supervision. Total monitored sedation time: 10 minutes FLUOROSCOPY TIME:  None. COMPLICATIONS: None immediate. Estimated blood loss: <25 mL PROCEDURE: Informed written consent was obtained from the patient after a thorough discussion of the procedural risks, benefits and alternatives. All questions were addressed. Maximal Sterile Barrier Technique was utilized including caps, mask, sterile gowns, sterile gloves, sterile drape, hand hygiene and skin antiseptic. A timeout was performed prior to the initiation of the procedure. The patient was positioned prone and non-contrast localization CT was performed of the pelvis to demonstrate the iliac marrow spaces. Maximal barrier sterile technique utilized including caps, mask, sterile gowns, sterile gloves, large sterile drape,  hand hygiene, and betadine prep. Under sterile conditions and local anesthesia, an 11 gauge coaxial bone biopsy needle was advanced into the right iliac marrow space. Needle position was confirmed with CT imaging. Initially, bone marrow aspiration was performed. Next, the 11 gauge outer cannula was utilized to obtain a right iliac bone marrow core biopsy. Needle was removed. Hemostasis was obtained with compression. The patient tolerated the procedure well. Samples were prepared with the cytotechnologist. IMPRESSION: Technically successful CT-guided bone marrow biopsy and aspirate of the right iliac bone. Electronically Signed   By: Jacqulynn Cadet M.D.   On: 07/14/2019 13:16     ASSESSMENT & PLAN:  78 y.o. female with  1. Multiple Myeloma with IgG Lambda specificity Labs upon initial presentation from 12/01/18 CBC w/diff revealed HGB at 10.4 with MCV of 105.7. 11/28/18 CMP revealed Creatinine normal at 0.68 and Calcium normal at 8.9. 11/28/18 Beta 2 microglobulin at 2.'6mg'$  (also a reading at 4.'7mg'$  on the same day). 11/30/18 24HR UPEP revealed M spike at 75.1%. 11/28/18 SPEP revealed M spike of 4.6g. 11/28/18  Immunoglobulins revealed IgG elevated at '6181mg'$ .  12/01/18 BM Bx revealed hypercellular bone marrow with 70-80% CD138 immunohistochemistry (44% aspirate) lambda-restricted plasma cells as well as a kappa restricted population of B-cells 11/27/18 MRI Thoracic Spine which revealed Multifocal marrow signal abnormality consistent with metastatic disease or multiple myeloma. The patient has multiple compression fractures most consistent with pathologic injuries. Extensive marrow signal abnormality makes determining age of the fractures difficult but edema is most intense in T3. 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 2. Areas of hypermetabolic disease identified in both lungs suggesting multiple myeloma involvement. 3. 1.9 cm calcified left thyroid nodule is hypermetabolic, but Indeterminate. 4. Colon is diffusely distended with gas and stool. Imaging features would be compatible with clinical constipation. 5.  Aortic Atherosclerois.  Pt describes grade II to III rash on her re-challenge from Revlimid with optimized pre-medications, and we discontinued Revlimid  Began Cytoxan with C4 Velcade and Dexamethasone  05/16/2019 M spike is down to 0.2g/dl showing continued improvement and a 90% reduction  07/17/2019 PET/CT Whole Body Scan ([id number]) revealed "1. 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 2. Hypermetabolic calcified left thyroid nodule, as before, indeterminate. 3. Aortic atherosclerosis (ICD10-170.0). Coronary artery calcification."  2. 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 She does not currently have elevated lymphocyte counts on peripheral blood draws.   PLAN: -Discussed pt labwork today, 07/18/19; all values are WNL except for WBC at 3.1K, RBC at 2.77, Hgb at 9.8, HCT at 29.0, MCV at 104.7, MCH at 35.4, Lymphs Abs at 0.1K, Glucose at 102, Calcium at 8.7, Total Protein at 5.9, AST at 12, Alkaline Phosphatase at 33. -07/04/2019 MMP is as follows: all values are WNL except for IgG at 438, IgA at 31, IgM at 13, Total Protein at 5.4, Gamma Glob at 0.2, M Protein at 0.2, Total Globulin at 1.9, Albumin/Glob at 1.9.  -07/17/2019 PET/CT Scan shows no evidence of  active myeloma - concerning left thyroid nodule  -07/14/2019 BM Bx official results are not back yet. Preliminary results show 5-10% plasma cells left in BM - not sure yet if this is an entirely clonal population  -Advised pt that it's okay to increase Vitamin D  -Discussed maintenance options ex. Revlimid, Velcade, Ninlaro -If maintenance is chosen pt would like to go with [employer]  -Discussed continuing current treatment in case of autologous transplant vs. 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 -Will refer pt to Dr. Norma Fredrickson at [facility]  -Will see back with C12D1  FOLLOW UP: Please schedule C12 of treatment per orders MD visit with C12D1 Labs on D1 and D8 of each cycle  The total time spent in the appt was 25 minutes and more than 50% was on  counseling and direct patient cares.  All of the patient's questions were answered with apparent satisfaction. The patient knows to call the clinic with any problems, questions or concerns.    Sullivan Lone MD [place] AAHIVMS [facility] Hematology/Oncology Physician [facility]  (Office):       [phone] (Work cell):  [phone] (Fax):           [phone]  07/18/2019 9:54 AM  I, Yevette Edwards, am acting as a scribe for Dr. Sullivan Lone.   .I have reviewed the above documentation for accuracy and completeness, and I agree with the above. [name] MD

## 2019-07-17 ENCOUNTER — Other Ambulatory Visit: Payer: Self-pay

## 2019-07-17 ENCOUNTER — Encounter (HOSPITAL_COMMUNITY)
Admission: RE | Admit: 2019-07-17 | Discharge: 2019-07-17 | Disposition: A | Discharge status: Home / Self Care | Payer: Medicare HMO | Source: Ambulatory Visit | Attending: Hematology | Admitting: Hematology

## 2019-07-17 DIAGNOSIS — E041 Nontoxic single thyroid nodule: Secondary | ICD-10-CM | POA: Diagnosis not present

## 2019-07-17 DIAGNOSIS — C9000 Multiple myeloma not having achieved remission: Secondary | ICD-10-CM

## 2019-07-17 DIAGNOSIS — I2510 Atherosclerotic heart disease of native coronary artery without angina pectoris: Secondary | ICD-10-CM | POA: Insufficient documentation

## 2019-07-17 LAB — GLUCOSE, CAPILLARY: Glucose-Capillary: 112 mg/dL — ABNORMAL HIGH (ref 70–99)

## 2019-07-17 MED ORDER — FLUDEOXYGLUCOSE F - 18 (FDG) INJECTION
5.7900 | Freq: Once | INTRAVENOUS | Status: AC | PRN
  Administered 2019-07-17: 13:00:00 5.79 via INTRAVENOUS

## 2019-07-18 ENCOUNTER — Inpatient Hospital Stay: Payer: Medicare HMO

## 2019-07-18 ENCOUNTER — Telehealth: Payer: Self-pay | Admitting: *Deleted

## 2019-07-18 ENCOUNTER — Other Ambulatory Visit: Payer: Self-pay

## 2019-07-18 ENCOUNTER — Inpatient Hospital Stay (HOSPITAL_BASED_OUTPATIENT_CLINIC_OR_DEPARTMENT_OTHER): Payer: Medicare HMO | Admitting: Hematology

## 2019-07-18 VITALS — BP 150/86 | HR 72 | Temp 98.7°F | Resp 17 | Ht 62.5 in | Wt 103.7 lb

## 2019-07-18 DIAGNOSIS — Z7189 Other specified counseling: Secondary | ICD-10-CM

## 2019-07-18 DIAGNOSIS — Z87891 Personal history of nicotine dependence: Secondary | ICD-10-CM | POA: Diagnosis not present

## 2019-07-18 DIAGNOSIS — M545 Low back pain: Secondary | ICD-10-CM | POA: Diagnosis not present

## 2019-07-18 DIAGNOSIS — C9000 Multiple myeloma not having achieved remission: Secondary | ICD-10-CM | POA: Diagnosis not present

## 2019-07-18 DIAGNOSIS — M25473 Effusion, unspecified ankle: Secondary | ICD-10-CM | POA: Diagnosis not present

## 2019-07-18 DIAGNOSIS — G893 Neoplasm related pain (acute) (chronic): Secondary | ICD-10-CM

## 2019-07-18 DIAGNOSIS — I10 Essential (primary) hypertension: Secondary | ICD-10-CM | POA: Diagnosis not present

## 2019-07-18 DIAGNOSIS — Z5111 Encounter for antineoplastic chemotherapy: Secondary | ICD-10-CM

## 2019-07-18 DIAGNOSIS — I700 Atherosclerosis of aorta: Secondary | ICD-10-CM | POA: Diagnosis not present

## 2019-07-18 DIAGNOSIS — M8580 Other specified disorders of bone density and structure, unspecified site: Secondary | ICD-10-CM | POA: Diagnosis not present

## 2019-07-18 DIAGNOSIS — M7989 Other specified soft tissue disorders: Secondary | ICD-10-CM | POA: Diagnosis not present

## 2019-07-18 DIAGNOSIS — E041 Nontoxic single thyroid nodule: Secondary | ICD-10-CM | POA: Diagnosis not present

## 2019-07-18 LAB — CMP (CANCER CENTER ONLY)
ALT: 14 U/L (ref 0–44)
AST: 12 U/L — ABNORMAL LOW (ref 15–41)
Albumin: 3.7 g/dL (ref 3.5–5.0)
Alkaline Phosphatase: 33 U/L — ABNORMAL LOW (ref 38–126)
Anion gap: 9 (ref 5–15)
BUN: 16 mg/dL (ref 8–23)
CO2: 29 mmol/L (ref 22–32)
Calcium: 8.7 mg/dL — ABNORMAL LOW (ref 8.9–10.3)
Chloride: 103 mmol/L (ref 98–111)
Creatinine: 0.62 mg/dL (ref 0.44–1.00)
GFR, Est AFR Am: >60 mL/min (ref >60)
GFR, Estimated: >60 mL/min (ref >60)
Glucose, Bld: 102 mg/dL — ABNORMAL HIGH (ref 70–99)
Potassium: 4.3 mmol/L (ref 3.5–5.1)
Sodium: 141 mmol/L (ref 135–145)
Total Bilirubin: 0.4 mg/dL (ref 0.3–1.2)
Total Protein: 5.9 g/dL — ABNORMAL LOW (ref 6.5–8.1)

## 2019-07-18 LAB — CBC WITH DIFFERENTIAL/PLATELET
Abs Immature Granulocytes: 0.03 10*3/uL (ref 0.00–0.07)
Basophils Absolute: 0 10*3/uL (ref 0.0–0.1)
Basophils Relative: 1 %
Eosinophils Absolute: 0.1 10*3/uL (ref 0.0–0.5)
Eosinophils Relative: 3 %
HCT: 29 % — ABNORMAL LOW (ref 36.0–46.0)
Hemoglobin: 9.8 g/dL — ABNORMAL LOW (ref 12.0–15.0)
Immature Granulocytes: 1 %
Lymphocytes Relative: 4 %
Lymphs Abs: 0.1 10*3/uL — ABNORMAL LOW (ref 0.7–4.0)
MCH: 35.4 pg — ABNORMAL HIGH (ref 26.0–34.0)
MCHC: 33.8 g/dL (ref 30.0–36.0)
MCV: 104.7 fL — ABNORMAL HIGH (ref 80.0–100.0)
Monocytes Absolute: 0.1 10*3/uL (ref 0.1–1.0)
Monocytes Relative: 3 %
Neutro Abs: 2.7 10*3/uL (ref 1.7–7.7)
Neutrophils Relative %: 88 %
Platelets: 323 10*3/uL (ref 150–400)
RBC: 2.77 MIL/uL — ABNORMAL LOW (ref 3.87–5.11)
RDW: 14.4 % (ref 11.5–15.5)
WBC: 3.1 10*3/uL — ABNORMAL LOW (ref 4.0–10.5)
nRBC: 0 % (ref 0.0–0.2)

## 2019-07-18 MED ORDER — DEXAMETHASONE 4 MG PO TABS
20.0000 mg | ORAL_TABLET | Freq: Once | ORAL | Status: AC
  Administered 2019-07-18: 20 mg via ORAL

## 2019-07-18 MED ORDER — PROCHLORPERAZINE MALEATE 10 MG PO TABS
10.0000 mg | ORAL_TABLET | Freq: Once | ORAL | Status: AC
  Administered 2019-07-18: 10 mg via ORAL

## 2019-07-18 MED ORDER — FENTANYL 25 MCG/HR TD PT72: 1.0000 | MEDICATED_PATCH | TRANSDERMAL | 0 refills | Status: DC

## 2019-07-18 MED ORDER — DEXAMETHASONE 4 MG PO TABS
ORAL_TABLET | ORAL | Status: AC
  Filled 2019-07-18: qty 5

## 2019-07-18 MED ORDER — BORTEZOMIB CHEMO SQ INJECTION 3.5 MG (2.5MG/ML)
1.3000 mg/m2 | Freq: Once | INTRAMUSCULAR | Status: AC
  Administered 2019-07-18: 2 mg via SUBCUTANEOUS
  Filled 2019-07-18: qty 0.8

## 2019-07-18 MED ORDER — PROCHLORPERAZINE MALEATE 10 MG PO TABS
ORAL_TABLET | ORAL | Status: AC
  Filled 2019-07-18: qty 1

## 2019-07-18 NOTE — Telephone Encounter (Signed)
Dr. Irene Limbo referred patient to [facility] for consideration for autologous transplant. Contacted referrals [phone], spoke with Amy for intake - patient info in Care Everywhere, patient has been seen at [facility] before for other health care.  Dr. Norma Fredrickson will review patient records and his office will contact patient for appt.  WF requested the following records from past year of treatment be faxed to [phone]:  Demographics  OV notes  Treatments  Scans  Labs/pathology reports   Requested [facility] HIM fax records.

## 2019-07-18 NOTE — Telephone Encounter (Signed)
Referral faxed to [facility] - att Dr. Norma Fredrickson - Release [id number]

## 2019-07-18 NOTE — Patient Instructions (Signed)
[facility] Discharge Instructions for Patients Receiving Chemotherapy  Today you received the following chemotherapy agents: Bortezomib (VELCADE).  To help prevent nausea and vomiting after your treatment, we encourage you to take your nausea medication as prescribed.   If you develop nausea and vomiting that is not controlled by your nausea medication, call the clinic.   BELOW ARE SYMPTOMS THAT SHOULD BE REPORTED IMMEDIATELY:  *FEVER GREATER THAN 100.5 F  *CHILLS WITH OR WITHOUT FEVER  NAUSEA AND VOMITING THAT IS NOT CONTROLLED WITH YOUR NAUSEA MEDICATION  *UNUSUAL SHORTNESS OF BREATH  *UNUSUAL BRUISING OR BLEEDING  TENDERNESS IN MOUTH AND THROAT WITH OR WITHOUT PRESENCE OF ULCERS  *URINARY PROBLEMS  *BOWEL PROBLEMS  UNUSUAL RASH Items with * indicate a potential emergency and should be followed up as soon as possible.  Feel free to call the clinic should you have any questions or concerns. The clinic phone number is [phone].  Please show the CHEMO ALERT CARD at check-in to the Emergency Department and triage nurse.   Coronavirus (COVID-19) Are you at risk?  Are you at risk for the Coronavirus (COVID-19)?  To be considered HIGH RISK for Coronavirus (COVID-19), you have to meet the following criteria:  . Traveled to [place], [place], [place], [place] or [place]; or in the [place] to [place], [place], [place], or [place]; and have fever, cough, and shortness of breath within the last 2 weeks of travel OR . Been in close contact with a person diagnosed with COVID-19 within the last 2 weeks and have fever, cough, and shortness of breath . IF YOU DO NOT MEET THESE CRITERIA, YOU ARE CONSIDERED LOW RISK FOR COVID-19.  What to do if you are HIGH RISK for COVID-19?  . If you are having a medical emergency, call 911. . Seek medical care right away. Before you go to a doctor's office, urgent care or emergency department, call ahead and tell them  about your recent travel, contact with someone diagnosed with COVID-19, and your symptoms. You should receive instructions from your physician's office regarding next steps of care.  . When you arrive at healthcare provider, tell the healthcare staff immediately you have returned from visiting [place], [place], [place], [place] or [place]; or traveled in the [place] to [place], [place], [place], or [place]; in the last two weeks or you have been in close contact with a person diagnosed with COVID-19 in the last 2 weeks.   . Tell the health care staff about your symptoms: fever, cough and shortness of breath. . After you have been seen by a medical provider, you will be either: o Tested for (COVID-19) and discharged home on quarantine except to seek medical care if symptoms worsen, and asked to  - Stay home and avoid contact with others until you get your results (4-5 days)  - Avoid travel on public transportation if possible (such as bus, train, or airplane) or o Sent to the Emergency Department by EMS for evaluation, COVID-19 testing, and possible admission depending on your condition and test results.  What to do if you are LOW RISK for COVID-19?  Reduce your risk of any infection by using the same precautions used for avoiding the common cold or flu:  . Wash your hands often with soap and warm water for at least 20 seconds.  If soap and water are not readily available, use an alcohol-based hand sanitizer with at least 60% alcohol.  . If coughing   or sneezing, cover your mouth and nose by coughing or sneezing into the elbow areas of your shirt or coat, into a tissue or into your sleeve (not your hands). . Avoid shaking hands with others and consider head nods or verbal greetings only. . Avoid touching your eyes, nose, or mouth with unwashed hands.  . Avoid close contact with people who are sick. . Avoid places or events with large numbers of people in one location, like concerts or  sporting events. . Carefully consider travel plans you have or are making. . If you are planning any travel outside or inside the [place], visit the CDC's Travelers' Health webpage for the latest health notices. . If you have some symptoms but not all symptoms, continue to monitor at home and seek medical attention if your symptoms worsen. . If you are having a medical emergency, call 911.   ADDITIONAL HEALTHCARE OPTIONS FOR PATIENTS  [facility] / e-Visit: [email or web address]         [facility]: [phone]  [facility]: [phone]                   [facility]: [phone]   

## 2019-07-19 ENCOUNTER — Telehealth: Payer: Self-pay | Admitting: Hematology

## 2019-07-19 DIAGNOSIS — R69 Illness, unspecified: Secondary | ICD-10-CM | POA: Diagnosis not present

## 2019-07-19 LAB — SURGICAL PATHOLOGY

## 2019-07-19 MED FILL — CYCLOPHOSPHAMIDE 50 MG CAPS: 50 | 28 days supply | Qty: 36 | Fill #2

## 2019-07-19 NOTE — Telephone Encounter (Signed)
I talk with patient regarding schedule  

## 2019-07-21 ENCOUNTER — Other Ambulatory Visit: Payer: Self-pay

## 2019-07-21 ENCOUNTER — Inpatient Hospital Stay: Payer: Medicare HMO

## 2019-07-21 VITALS — BP 174/92 | HR 66 | Temp 98.5°F | Resp 18

## 2019-07-21 DIAGNOSIS — M8580 Other specified disorders of bone density and structure, unspecified site: Secondary | ICD-10-CM | POA: Diagnosis not present

## 2019-07-21 DIAGNOSIS — Z5111 Encounter for antineoplastic chemotherapy: Secondary | ICD-10-CM | POA: Diagnosis not present

## 2019-07-21 DIAGNOSIS — C9000 Multiple myeloma not having achieved remission: Secondary | ICD-10-CM

## 2019-07-21 DIAGNOSIS — Z7189 Other specified counseling: Secondary | ICD-10-CM

## 2019-07-21 DIAGNOSIS — Z87891 Personal history of nicotine dependence: Secondary | ICD-10-CM | POA: Diagnosis not present

## 2019-07-21 DIAGNOSIS — I700 Atherosclerosis of aorta: Secondary | ICD-10-CM | POA: Diagnosis not present

## 2019-07-21 DIAGNOSIS — M545 Low back pain: Secondary | ICD-10-CM | POA: Diagnosis not present

## 2019-07-21 DIAGNOSIS — M25473 Effusion, unspecified ankle: Secondary | ICD-10-CM | POA: Diagnosis not present

## 2019-07-21 DIAGNOSIS — I10 Essential (primary) hypertension: Secondary | ICD-10-CM | POA: Diagnosis not present

## 2019-07-21 DIAGNOSIS — M7989 Other specified soft tissue disorders: Secondary | ICD-10-CM | POA: Diagnosis not present

## 2019-07-21 DIAGNOSIS — E041 Nontoxic single thyroid nodule: Secondary | ICD-10-CM | POA: Diagnosis not present

## 2019-07-21 MED ORDER — PROCHLORPERAZINE MALEATE 10 MG PO TABS
ORAL_TABLET | ORAL | Status: AC
  Filled 2019-07-21: qty 1

## 2019-07-21 MED ORDER — PROCHLORPERAZINE MALEATE 10 MG PO TABS
10.0000 mg | ORAL_TABLET | Freq: Once | ORAL | Status: AC
  Administered 2019-07-21: 08:00:00 10 mg via ORAL

## 2019-07-21 MED ORDER — BORTEZOMIB CHEMO SQ INJECTION 3.5 MG (2.5MG/ML)
1.3000 mg/m2 | Freq: Once | INTRAMUSCULAR | Status: AC
  Administered 2019-07-21: 2 mg via SUBCUTANEOUS
  Filled 2019-07-21: qty 0.8

## 2019-07-24 ENCOUNTER — Encounter (HOSPITAL_COMMUNITY): Payer: Self-pay | Admitting: Hematology

## 2019-07-24 DIAGNOSIS — I10 Essential (primary) hypertension: Secondary | ICD-10-CM | POA: Diagnosis not present

## 2019-07-24 DIAGNOSIS — C9000 Multiple myeloma not having achieved remission: Secondary | ICD-10-CM | POA: Diagnosis not present

## 2019-07-24 DIAGNOSIS — R52 Pain, unspecified: Secondary | ICD-10-CM | POA: Diagnosis not present

## 2019-07-24 DIAGNOSIS — Z79891 Long term (current) use of opiate analgesic: Secondary | ICD-10-CM | POA: Diagnosis not present

## 2019-07-25 ENCOUNTER — Other Ambulatory Visit: Payer: Self-pay

## 2019-07-25 ENCOUNTER — Inpatient Hospital Stay: Payer: Medicare HMO

## 2019-07-25 VITALS — BP 152/86 | HR 69 | Temp 99.2°F | Resp 18

## 2019-07-25 DIAGNOSIS — Z87891 Personal history of nicotine dependence: Secondary | ICD-10-CM | POA: Diagnosis not present

## 2019-07-25 DIAGNOSIS — C9000 Multiple myeloma not having achieved remission: Secondary | ICD-10-CM

## 2019-07-25 DIAGNOSIS — E041 Nontoxic single thyroid nodule: Secondary | ICD-10-CM | POA: Diagnosis not present

## 2019-07-25 DIAGNOSIS — Z7189 Other specified counseling: Secondary | ICD-10-CM

## 2019-07-25 DIAGNOSIS — M7989 Other specified soft tissue disorders: Secondary | ICD-10-CM | POA: Diagnosis not present

## 2019-07-25 DIAGNOSIS — I700 Atherosclerosis of aorta: Secondary | ICD-10-CM | POA: Diagnosis not present

## 2019-07-25 DIAGNOSIS — M545 Low back pain: Secondary | ICD-10-CM | POA: Diagnosis not present

## 2019-07-25 DIAGNOSIS — M25473 Effusion, unspecified ankle: Secondary | ICD-10-CM | POA: Diagnosis not present

## 2019-07-25 DIAGNOSIS — M8580 Other specified disorders of bone density and structure, unspecified site: Secondary | ICD-10-CM | POA: Diagnosis not present

## 2019-07-25 DIAGNOSIS — Z5111 Encounter for antineoplastic chemotherapy: Secondary | ICD-10-CM

## 2019-07-25 DIAGNOSIS — I10 Essential (primary) hypertension: Secondary | ICD-10-CM | POA: Diagnosis not present

## 2019-07-25 LAB — CMP (CANCER CENTER ONLY)
ALT: 13 U/L (ref 0–44)
AST: 13 U/L — ABNORMAL LOW (ref 15–41)
Albumin: 3.7 g/dL (ref 3.5–5.0)
Alkaline Phosphatase: 32 U/L — ABNORMAL LOW (ref 38–126)
Anion gap: 10 (ref 5–15)
BUN: 14 mg/dL (ref 8–23)
CO2: 26 mmol/L (ref 22–32)
Calcium: 8.6 mg/dL — ABNORMAL LOW (ref 8.9–10.3)
Chloride: 103 mmol/L (ref 98–111)
Creatinine: 0.63 mg/dL (ref 0.44–1.00)
GFR, Est AFR Am: >60 mL/min (ref >60)
GFR, Estimated: >60 mL/min (ref >60)
Glucose, Bld: 117 mg/dL — ABNORMAL HIGH (ref 70–99)
Potassium: 4.2 mmol/L (ref 3.5–5.1)
Sodium: 139 mmol/L (ref 135–145)
Total Bilirubin: 0.3 mg/dL (ref 0.3–1.2)
Total Protein: 5.6 g/dL — ABNORMAL LOW (ref 6.5–8.1)

## 2019-07-25 LAB — CBC WITH DIFFERENTIAL/PLATELET
Abs Immature Granulocytes: 0.01 10*3/uL (ref 0.00–0.07)
Basophils Absolute: 0 10*3/uL (ref 0.0–0.1)
Basophils Relative: 1 %
Eosinophils Absolute: 0.1 10*3/uL (ref 0.0–0.5)
Eosinophils Relative: 4 %
HCT: 29.5 % — ABNORMAL LOW (ref 36.0–46.0)
Hemoglobin: 9.8 g/dL — ABNORMAL LOW (ref 12.0–15.0)
Immature Granulocytes: 0 %
Lymphocytes Relative: 6 %
Lymphs Abs: 0.2 10*3/uL — ABNORMAL LOW (ref 0.7–4.0)
MCH: 35.4 pg — ABNORMAL HIGH (ref 26.0–34.0)
MCHC: 33.2 g/dL (ref 30.0–36.0)
MCV: 106.5 fL — ABNORMAL HIGH (ref 80.0–100.0)
Monocytes Absolute: 0.3 10*3/uL (ref 0.1–1.0)
Monocytes Relative: 12 %
Neutro Abs: 1.8 10*3/uL (ref 1.7–7.7)
Neutrophils Relative %: 77 %
Platelets: 153 10*3/uL (ref 150–400)
RBC: 2.77 MIL/uL — ABNORMAL LOW (ref 3.87–5.11)
RDW: 14.3 % (ref 11.5–15.5)
WBC: 2.4 10*3/uL — ABNORMAL LOW (ref 4.0–10.5)
nRBC: 0 % (ref 0.0–0.2)

## 2019-07-25 MED ORDER — DENOSUMAB 120 MG/1.7ML ~~LOC~~ SOLN
SUBCUTANEOUS | Status: AC
  Filled 2019-07-25: qty 1.7

## 2019-07-25 MED ORDER — DENOSUMAB 120 MG/1.7ML ~~LOC~~ SOLN
120.0000 mg | Freq: Once | SUBCUTANEOUS | Status: AC
  Administered 2019-07-25: 120 mg via SUBCUTANEOUS

## 2019-07-25 MED ORDER — PROCHLORPERAZINE MALEATE 10 MG PO TABS
10.0000 mg | ORAL_TABLET | Freq: Once | ORAL | Status: AC
  Administered 2019-07-25: 10 mg via ORAL

## 2019-07-25 MED ORDER — PROCHLORPERAZINE MALEATE 10 MG PO TABS
ORAL_TABLET | ORAL | Status: AC
  Filled 2019-07-25: qty 1

## 2019-07-25 MED ORDER — BORTEZOMIB CHEMO SQ INJECTION 3.5 MG (2.5MG/ML)
1.3000 mg/m2 | Freq: Once | INTRAMUSCULAR | Status: AC
  Administered 2019-07-25: 2 mg via SUBCUTANEOUS
  Filled 2019-07-25: qty 0.8

## 2019-07-25 MED ORDER — DEXAMETHASONE 4 MG PO TABS
ORAL_TABLET | ORAL | Status: AC
  Filled 2019-07-25: qty 5

## 2019-07-25 MED ORDER — DEXAMETHASONE 4 MG PO TABS
20.0000 mg | ORAL_TABLET | Freq: Once | ORAL | Status: AC
  Administered 2019-07-25: 20 mg via ORAL

## 2019-07-25 NOTE — Patient Instructions (Signed)
[facility] Discharge Instructions for Patients Receiving Chemotherapy  Today you received the following chemotherapy agents Bortezomib (VELCADE); xgeva  To help prevent nausea and vomiting after your treatment, we encourage you to take your nausea medication as prescribed.   If you develop nausea and vomiting that is not controlled by your nausea medication, call the clinic.   BELOW ARE SYMPTOMS THAT SHOULD BE REPORTED IMMEDIATELY:  *FEVER GREATER THAN 100.5 F  *CHILLS WITH OR WITHOUT FEVER  NAUSEA AND VOMITING THAT IS NOT CONTROLLED WITH YOUR NAUSEA MEDICATION  *UNUSUAL SHORTNESS OF BREATH  *UNUSUAL BRUISING OR BLEEDING  TENDERNESS IN MOUTH AND THROAT WITH OR WITHOUT PRESENCE OF ULCERS  *URINARY PROBLEMS  *BOWEL PROBLEMS  UNUSUAL RASH Items with * indicate a potential emergency and should be followed up as soon as possible.  Feel free to call the clinic should you have any questions or concerns. The clinic phone number is [phone].  Please show the [facility] at check-in to the Emergency Department and triage nurse.  Coronavirus (COVID-19) Are you at risk?  Are you at risk for the Coronavirus (COVID-19)?  To be considered HIGH RISK for Coronavirus (COVID-19), you have to meet the following criteria:  . Traveled to [place], [place], [place], [place] or [place]; or in the [place] to [place], [place], [place], or [place]; and have fever, cough, and shortness of breath within the last 2 weeks of travel OR . Been in close contact with a person diagnosed with COVID-19 within the last 2 weeks and have fever, cough, and shortness of breath . IF YOU DO NOT MEET THESE CRITERIA, YOU ARE CONSIDERED LOW RISK FOR COVID-19.  What to do if you are HIGH RISK for COVID-19?  Marland Kitchen If you are having a medical emergency, call 911. . Seek medical care right away. Before you go to a doctor's office, urgent care or emergency department, call ahead and tell  them about your recent travel, contact with someone diagnosed with COVID-19, and your symptoms. You should receive instructions from your physician's office regarding next steps of care.  . When you arrive at healthcare provider, tell the healthcare staff immediately you have returned from visiting [place], [place], [place], [place] or [place]; or traveled in the [place] to [place], [place], [place], or [place]; in the last two weeks or you have been in close contact with a person diagnosed with COVID-19 in the last 2 weeks.   . Tell the health care staff about your symptoms: fever, cough and shortness of breath. . After you have been seen by a medical provider, you will be either: o Tested for (COVID-19) and discharged home on quarantine except to seek medical care if symptoms worsen, and asked to  - Stay home and avoid contact with others until you get your results (4-5 days)  - Avoid travel on public transportation if possible (such as bus, train, or airplane) or o Sent to the Emergency Department by EMS for evaluation, COVID-19 testing, and possible admission depending on your condition and test results.  What to do if you are LOW RISK for COVID-19?  Reduce your risk of any infection by using the same precautions used for avoiding the common cold or flu:  Marland Kitchen Wash your hands often with soap and warm water for at least 20 seconds.  If soap and water are not readily available, use an alcohol-based hand sanitizer with at least 60% alcohol.  . If coughing  or sneezing, cover your mouth and nose by coughing or sneezing into the elbow areas of your shirt or coat, into a tissue or into your sleeve (not your hands). . Avoid shaking hands with others and consider head nods or verbal greetings only. . Avoid touching your eyes, nose, or mouth with unwashed hands.  . Avoid close contact with people who are sick. . Avoid places or events with large numbers of people in one location, like concerts or  sporting events. . Carefully consider travel plans you have or are making. . If you are planning any travel outside or inside the [place], visit the CDC's Travelers' Health webpage for the latest health notices. . If you have some symptoms but not all symptoms, continue to monitor at home and seek medical attention if your symptoms worsen. . If you are having a medical emergency, call 911.   ADDITIONAL HEALTHCARE OPTIONS FOR PATIENTS  [facility] / e-Visit: [email or web address]         [facility]: [phone]  [facility]: [phone]                   [facility]: [phone]   

## 2019-07-26 ENCOUNTER — Telehealth: Payer: Self-pay | Admitting: *Deleted

## 2019-07-26 DIAGNOSIS — C9000 Multiple myeloma not having achieved remission: Secondary | ICD-10-CM

## 2019-07-26 NOTE — Telephone Encounter (Signed)
Contacted by Hildred Alamin from [phone] (works with Dr. Norma Fredrickson) [phone]. Patient saw Dr. Norma Fredrickson to discuss potential transplant. Dr. Norma Fredrickson recommends patient change from [place] See to to [place],  [place] office visit note from 07/25/2019 to Dr. Irene Limbo. Dr. Irene Limbo has been given Dr. Norma Fredrickson' request for medication change.   Also requests patient have labs completed at [facility] before 08/08/2019: Serum Protein Electrophoresis w/immunofixation;Serum free light chains; Quantitative Immunoglobulins. (Myeloma Panel)  Patient has appt at [facility] on 10/23 for labs.   Contacted patient to inform about lab request. Patient in agreement to have lab appt on Friday before infusion appt.

## 2019-07-28 ENCOUNTER — Other Ambulatory Visit: Payer: Self-pay

## 2019-07-28 ENCOUNTER — Inpatient Hospital Stay: Payer: Medicare HMO

## 2019-07-28 VITALS — BP 168/85 | HR 67 | Temp 98.9°F | Resp 18

## 2019-07-28 DIAGNOSIS — Z7189 Other specified counseling: Secondary | ICD-10-CM

## 2019-07-28 DIAGNOSIS — C9000 Multiple myeloma not having achieved remission: Secondary | ICD-10-CM

## 2019-07-28 DIAGNOSIS — I700 Atherosclerosis of aorta: Secondary | ICD-10-CM | POA: Diagnosis not present

## 2019-07-28 DIAGNOSIS — M25473 Effusion, unspecified ankle: Secondary | ICD-10-CM | POA: Diagnosis not present

## 2019-07-28 DIAGNOSIS — M545 Low back pain: Secondary | ICD-10-CM | POA: Diagnosis not present

## 2019-07-28 DIAGNOSIS — Z5111 Encounter for antineoplastic chemotherapy: Secondary | ICD-10-CM | POA: Diagnosis not present

## 2019-07-28 DIAGNOSIS — Z87891 Personal history of nicotine dependence: Secondary | ICD-10-CM | POA: Diagnosis not present

## 2019-07-28 DIAGNOSIS — E041 Nontoxic single thyroid nodule: Secondary | ICD-10-CM | POA: Diagnosis not present

## 2019-07-28 DIAGNOSIS — M8580 Other specified disorders of bone density and structure, unspecified site: Secondary | ICD-10-CM | POA: Diagnosis not present

## 2019-07-28 DIAGNOSIS — M7989 Other specified soft tissue disorders: Secondary | ICD-10-CM | POA: Diagnosis not present

## 2019-07-28 DIAGNOSIS — I10 Essential (primary) hypertension: Secondary | ICD-10-CM | POA: Diagnosis not present

## 2019-07-28 LAB — CMP (CANCER CENTER ONLY)
ALT: 13 U/L (ref 0–44)
AST: 11 U/L — ABNORMAL LOW (ref 15–41)
Albumin: 4 g/dL (ref 3.5–5.0)
Alkaline Phosphatase: 35 U/L — ABNORMAL LOW (ref 38–126)
Anion gap: 8 (ref 5–15)
BUN: 16 mg/dL (ref 8–23)
CO2: 28 mmol/L (ref 22–32)
Calcium: 9 mg/dL (ref 8.9–10.3)
Chloride: 103 mmol/L (ref 98–111)
Creatinine: 0.65 mg/dL (ref 0.44–1.00)
GFR, Est AFR Am: >60 mL/min (ref >60)
GFR, Estimated: >60 mL/min (ref >60)
Glucose, Bld: 96 mg/dL (ref 70–99)
Potassium: 4.3 mmol/L (ref 3.5–5.1)
Sodium: 139 mmol/L (ref 135–145)
Total Bilirubin: 0.3 mg/dL (ref 0.3–1.2)
Total Protein: 6.1 g/dL — ABNORMAL LOW (ref 6.5–8.1)

## 2019-07-28 LAB — CBC WITH DIFFERENTIAL/PLATELET
Abs Immature Granulocytes: 0.01 10*3/uL (ref 0.00–0.07)
Basophils Absolute: 0 10*3/uL (ref 0.0–0.1)
Basophils Relative: 1 %
Eosinophils Absolute: 0 10*3/uL (ref 0.0–0.5)
Eosinophils Relative: 2 %
HCT: 31 % — ABNORMAL LOW (ref 36.0–46.0)
Hemoglobin: 10.3 g/dL — ABNORMAL LOW (ref 12.0–15.0)
Immature Granulocytes: 1 %
Lymphocytes Relative: 5 %
Lymphs Abs: 0.1 10*3/uL — ABNORMAL LOW (ref 0.7–4.0)
MCH: 35.8 pg — ABNORMAL HIGH (ref 26.0–34.0)
MCHC: 33.2 g/dL (ref 30.0–36.0)
MCV: 107.6 fL — ABNORMAL HIGH (ref 80.0–100.0)
Monocytes Absolute: 0.4 10*3/uL (ref 0.1–1.0)
Monocytes Relative: 19 %
Neutro Abs: 1.6 10*3/uL — ABNORMAL LOW (ref 1.7–7.7)
Neutrophils Relative %: 72 %
Platelets: 146 10*3/uL — ABNORMAL LOW (ref 150–400)
RBC: 2.88 MIL/uL — ABNORMAL LOW (ref 3.87–5.11)
RDW: 14.2 % (ref 11.5–15.5)
WBC: 2.1 10*3/uL — ABNORMAL LOW (ref 4.0–10.5)
nRBC: 0 % (ref 0.0–0.2)

## 2019-07-28 MED ORDER — BORTEZOMIB CHEMO SQ INJECTION 3.5 MG (2.5MG/ML)
1.3000 mg/m2 | Freq: Once | INTRAMUSCULAR | Status: AC
  Administered 2019-07-28: 2 mg via SUBCUTANEOUS
  Filled 2019-07-28: qty 0.8

## 2019-07-28 MED ORDER — PROCHLORPERAZINE MALEATE 10 MG PO TABS
10.0000 mg | ORAL_TABLET | Freq: Once | ORAL | Status: AC
  Administered 2019-07-28: 10 mg via ORAL

## 2019-07-28 MED ORDER — PROCHLORPERAZINE MALEATE 10 MG PO TABS
ORAL_TABLET | ORAL | Status: AC
  Filled 2019-07-28: qty 1

## 2019-07-28 NOTE — Patient Instructions (Signed)
[facility] Discharge Instructions for Patients Receiving Chemotherapy  Today you received the following chemotherapy agents Velcade.  To help prevent nausea and vomiting after your treatment, we encourage you to take your nausea medication as directed.  If you develop nausea and vomiting that is not controlled by your nausea medication, call the clinic.   BELOW ARE SYMPTOMS THAT SHOULD BE REPORTED IMMEDIATELY:  *FEVER GREATER THAN 100.5 F  *CHILLS WITH OR WITHOUT FEVER  NAUSEA AND VOMITING THAT IS NOT CONTROLLED WITH YOUR NAUSEA MEDICATION  *UNUSUAL SHORTNESS OF BREATH  *UNUSUAL BRUISING OR BLEEDING  TENDERNESS IN MOUTH AND THROAT WITH OR WITHOUT PRESENCE OF ULCERS  *URINARY PROBLEMS  *BOWEL PROBLEMS  UNUSUAL RASH Items with * indicate a potential emergency and should be followed up as soon as possible.  Feel free to call the clinic should you have any questions or concerns. The clinic phone number is [phone].  Please show the CHEMO ALERT CARD at check-in to the Emergency Department and triage nurse.   

## 2019-07-31 LAB — MULTIPLE MYELOMA PANEL, SERUM
Albumin SerPl Elph-Mcnc: 4.1 g/dL (ref 2.9–4.4)
Albumin/Glob SerPl: 2.5 — ABNORMAL HIGH (ref 0.7–1.7)
Alpha 1: 0.2 g/dL (ref 0.0–0.4)
Alpha2 Glob SerPl Elph-Mcnc: 0.7 g/dL (ref 0.4–1.0)
B-Globulin SerPl Elph-Mcnc: 0.7 g/dL (ref 0.7–1.3)
Gamma Glob SerPl Elph-Mcnc: 0.1 g/dL — ABNORMAL LOW (ref 0.4–1.8)
Globulin, Total: 1.7 g/dL — ABNORMAL LOW (ref 2.2–3.9)
IgA: 29 mg/dL — ABNORMAL LOW (ref 64–422)
IgG (Immunoglobin G), Serum: 405 mg/dL — ABNORMAL LOW (ref 586–1602)
IgM (Immunoglobulin M), Srm: 10 mg/dL — ABNORMAL LOW (ref 26–217)
M Protein SerPl Elph-Mcnc: 0.2 g/dL — ABNORMAL HIGH
Total Protein ELP: 5.8 g/dL — ABNORMAL LOW (ref 6.0–8.5)

## 2019-08-03 ENCOUNTER — Other Ambulatory Visit: Payer: Self-pay | Admitting: *Deleted

## 2019-08-03 MED ORDER — OXYCODONE-ACETAMINOPHEN 5-325 MG PO TABS: 1.0000 | ORAL_TABLET | ORAL | 0 refills | Status: DC | PRN

## 2019-08-03 NOTE — Telephone Encounter (Signed)
Requested refill for oxycodone. 

## 2019-08-04 ENCOUNTER — Other Ambulatory Visit: Payer: Self-pay | Admitting: Hematology

## 2019-08-04 MED ORDER — OXYCODONE-ACETAMINOPHEN 5-325 MG PO TABS: 1.0000 | ORAL_TABLET | ORAL | 0 refills | Status: DC | PRN

## 2019-08-08 ENCOUNTER — Inpatient Hospital Stay (HOSPITAL_BASED_OUTPATIENT_CLINIC_OR_DEPARTMENT_OTHER): Payer: Medicare HMO | Admitting: Hematology

## 2019-08-08 ENCOUNTER — Inpatient Hospital Stay: Payer: Medicare HMO

## 2019-08-08 ENCOUNTER — Other Ambulatory Visit: Payer: Self-pay

## 2019-08-08 ENCOUNTER — Inpatient Hospital Stay: Payer: Medicare HMO | Attending: Hematology

## 2019-08-08 VITALS — BP 158/85 | HR 76 | Temp 98.9°F | Resp 18 | Ht 62.5 in | Wt 105.2 lb

## 2019-08-08 DIAGNOSIS — R21 Rash and other nonspecific skin eruption: Secondary | ICD-10-CM | POA: Diagnosis not present

## 2019-08-08 DIAGNOSIS — I700 Atherosclerosis of aorta: Secondary | ICD-10-CM | POA: Diagnosis not present

## 2019-08-08 DIAGNOSIS — M25473 Effusion, unspecified ankle: Secondary | ICD-10-CM | POA: Diagnosis not present

## 2019-08-08 DIAGNOSIS — M7989 Other specified soft tissue disorders: Secondary | ICD-10-CM | POA: Insufficient documentation

## 2019-08-08 DIAGNOSIS — I10 Essential (primary) hypertension: Secondary | ICD-10-CM | POA: Diagnosis not present

## 2019-08-08 DIAGNOSIS — Z5111 Encounter for antineoplastic chemotherapy: Secondary | ICD-10-CM | POA: Insufficient documentation

## 2019-08-08 DIAGNOSIS — C9000 Multiple myeloma not having achieved remission: Secondary | ICD-10-CM | POA: Insufficient documentation

## 2019-08-08 DIAGNOSIS — Z7189 Other specified counseling: Secondary | ICD-10-CM

## 2019-08-08 DIAGNOSIS — D61818 Other pancytopenia: Secondary | ICD-10-CM | POA: Insufficient documentation

## 2019-08-08 DIAGNOSIS — M545 Low back pain: Secondary | ICD-10-CM | POA: Insufficient documentation

## 2019-08-08 DIAGNOSIS — Z79899 Other long term (current) drug therapy: Secondary | ICD-10-CM | POA: Diagnosis not present

## 2019-08-08 DIAGNOSIS — Z87891 Personal history of nicotine dependence: Secondary | ICD-10-CM | POA: Diagnosis not present

## 2019-08-08 DIAGNOSIS — K5900 Constipation, unspecified: Secondary | ICD-10-CM | POA: Diagnosis not present

## 2019-08-08 DIAGNOSIS — M8580 Other specified disorders of bone density and structure, unspecified site: Secondary | ICD-10-CM | POA: Diagnosis not present

## 2019-08-08 LAB — CMP (CANCER CENTER ONLY)
ALT: 16 U/L (ref 0–44)
AST: 14 U/L — ABNORMAL LOW (ref 15–41)
Albumin: 4 g/dL (ref 3.5–5.0)
Alkaline Phosphatase: 33 U/L — ABNORMAL LOW (ref 38–126)
Anion gap: 10 (ref 5–15)
BUN: 14 mg/dL (ref 8–23)
CO2: 25 mmol/L (ref 22–32)
Calcium: 8.7 mg/dL — ABNORMAL LOW (ref 8.9–10.3)
Chloride: 99 mmol/L (ref 98–111)
Creatinine: 0.63 mg/dL (ref 0.44–1.00)
GFR, Est AFR Am: >60 mL/min (ref >60)
GFR, Estimated: >60 mL/min (ref >60)
Glucose, Bld: 104 mg/dL — ABNORMAL HIGH (ref 70–99)
Potassium: 4 mmol/L (ref 3.5–5.1)
Sodium: 134 mmol/L — ABNORMAL LOW (ref 135–145)
Total Bilirubin: 0.3 mg/dL (ref 0.3–1.2)
Total Protein: 6.1 g/dL — ABNORMAL LOW (ref 6.5–8.1)

## 2019-08-08 LAB — CBC WITH DIFFERENTIAL/PLATELET
Abs Immature Granulocytes: 0.04 10*3/uL (ref 0.00–0.07)
Basophils Absolute: 0 10*3/uL (ref 0.0–0.1)
Basophils Relative: 1 %
Eosinophils Absolute: 0.1 10*3/uL (ref 0.0–0.5)
Eosinophils Relative: 2 %
HCT: 30.2 % — ABNORMAL LOW (ref 36.0–46.0)
Hemoglobin: 10.3 g/dL — ABNORMAL LOW (ref 12.0–15.0)
Immature Granulocytes: 1 %
Lymphocytes Relative: 1 %
Lymphs Abs: 0.1 10*3/uL — ABNORMAL LOW (ref 0.7–4.0)
MCH: 35.9 pg — ABNORMAL HIGH (ref 26.0–34.0)
MCHC: 34.1 g/dL (ref 30.0–36.0)
MCV: 105.2 fL — ABNORMAL HIGH (ref 80.0–100.0)
Monocytes Absolute: 0.4 10*3/uL (ref 0.1–1.0)
Monocytes Relative: 6 %
Neutro Abs: 5.3 10*3/uL (ref 1.7–7.7)
Neutrophils Relative %: 89 %
Platelets: 283 10*3/uL (ref 150–400)
RBC: 2.87 MIL/uL — ABNORMAL LOW (ref 3.87–5.11)
RDW: 13.8 % (ref 11.5–15.5)
WBC: 5.9 10*3/uL (ref 4.0–10.5)
nRBC: 0 % (ref 0.0–0.2)

## 2019-08-08 MED ORDER — DEXAMETHASONE 4 MG PO TABS
20.0000 mg | ORAL_TABLET | Freq: Once | ORAL | Status: AC
  Administered 2019-08-08: 20 mg via ORAL

## 2019-08-08 MED ORDER — BORTEZOMIB CHEMO SQ INJECTION 3.5 MG (2.5MG/ML)
1.3000 mg/m2 | Freq: Once | INTRAMUSCULAR | Status: AC
  Administered 2019-08-08: 2 mg via SUBCUTANEOUS
  Filled 2019-08-08: qty 0.8

## 2019-08-08 MED ORDER — DEXAMETHASONE 4 MG PO TABS
ORAL_TABLET | ORAL | Status: AC
  Filled 2019-08-08: qty 5

## 2019-08-08 MED ORDER — PROCHLORPERAZINE MALEATE 10 MG PO TABS
10.0000 mg | ORAL_TABLET | Freq: Once | ORAL | Status: AC
  Administered 2019-08-08: 10 mg via ORAL

## 2019-08-08 MED ORDER — PROCHLORPERAZINE MALEATE 10 MG PO TABS
ORAL_TABLET | ORAL | Status: AC
  Filled 2019-08-08: qty 1

## 2019-08-08 NOTE — Progress Notes (Signed)
HEMATOLOGY/ONCOLOGY CLINIC NOTE  Date of Service: 08/08/2019  Patient Care Team: [name], MD as PCP - General (Family Medicine) [name], MD as Consulting Physician (Hematology) Bond, [name], MD as Referring Physician (Ophthalmology)  Dr. Ronney Lion as [place] Specialist  [phone]  CHIEF COMPLAINTS/PURPOSE OF CONSULTATION:   Multiple Myeloma- continued management  HISTORY OF PRESENTING ILLNESS:   Megan Olson is a wonderful 78 y.o. female who has been referred to [place] by Dr. 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 She is accompanied today by her husband. The pt reports that she is doing well overall.   The pt notes that she was doing extensive yard work in October 2019, and began to feel back pain a few days after this, which she attributed to muscle pain. She recalls taking a deep breath, and developing sudden acute pain, and presented to care with her PCP's office. She began exercises for her back pain, without relief, then began PT without relief, then was referred to Dr. Paulla Fore in sports medicine in late January 2020. She had an XR which revealed a compression fracture at T11, then subsequently had an MRI, and a bone marrow biopsy. The pt notes that her back pain "seemed to move around." She endorses pain "up the whole left side" of her back.  The pt notes that she is not able to stand up straight due to her back pain, which she feels limits her ability to take a deep breath, and endorses pain exacerbation when she does take a deep breath. The pt needs assistance from sitting to lying from her husband. She is using about 3 Percocet a day.  The pt notes worsened constipation since beginning Percocet, and notes that her most recent laxative order was not covered by her insurance. She is using prune juice and milk of magnesia. She took Senokot S BID without relief.  The pt reports that  prior to her recent back pain, she had very few medical concerns. She endorses controlled BP, and has been monitored for DM but after closely watching her diet her A1C decreased to 5.8. She has glaucoma, and has had surgery in both eyes. Right eye with stent. The pt sees Dr. Edilia Bo at [facility] for her eye care. The pt notes that her vision has been recently "pretty good." The pt notes that she has been advised to limit treatment with steroids for her glaucoma. She denies heart or lung problems. Denies previous back problems. Last DEXA scan 3 years ago, and endorses osteopenia. She notes that she took Fosamax for 3-4 years, and stopped 5-6 years ago. She takes Vitamin D, a multivitamin, and magnesium.  The pt notes that she quit smoking cigarettes when she was 27, started when she was about 20. The pt consumes alcohol rarely, but not since beginning narcotics. She previously worked in [employer].  Of note prior to the patient's visit today, pt has had an MRI Thoracic Spine completed on 11/27/18 with results revealing Multifocal marrow signal abnormality consistent with metastatic disease or multiple myeloma. The patient has multiple compression fractures most consistent with pathologic injuries. Extensive marrow signal abnormality makes determining age of the fractures difficult but edema is most intense in T3. 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  Most recent lab results (12/01/18) of CBC w/diff is as follows: all  values are WNL except for RBC at 3.17, HGB at 10.4, HCT at 33.5, MCV at 105.7. 11/28/18 CMP revealed all values WNL except for Glucose at 105, Total Protein at 10.2, Albumin at 3.1 11/30/18 24HR UPEP revealed all values WNL except for M-spike at 75.1%. 11/28/18 Bega-2 microglobulin slightly elevated at 2.6 11/28/18 SPEP revealed M spike at 4.6g 11/28/18 Immunoglobulins revealed IgG at 6181, IgA at 24,  IgM at 16, and IgE at 6.  On review of systems, pt reports constipation, back pain, stable energy levels, ankle swelling, tenderness at T3, lower back pain, and denies abdominal pains, neck pain, and any other symptoms.   On PMHx the pt reports redundant colon, single hemorrhoid, glaucoma, HTN, osteopenia, Tonsillectomy, right eye stent and multiple eye surgeries. On Social Hx the pt reports rare alcohol use, smoked cigarettes between ages 39 and 62. 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 On Family Hx the pt reports sister died from small cell lung cancer and was a lifelong smoker, brother's daughter died of breast cancer with BRCA1 and BRCA2 mutations. Cousin with female breast cancer.   INTERVAL HISTORY:   Megan Olson returns today for management and evaluation of her multiple myeloma and C12D1 of dexamethazone, bortezomib, cytoxan and denosumab at this time. The patient's last visit with [place] was on 07/18/2019. The pt reports that she is doing well overall.  The pt reports that after meeting with Dr. Norma Fredrickson she is not interested in getting a transplant at this time. She is concerned that her thyroid could be preventing her from gaining weight due to a thyroid nodule visualized on her last PET/CT scan. She does not currently see an Endocrinologist. Pt is still experiencing back pain in the same place and feels that it is limiting some of her activities. Pt was supposed to have a kyphoplasty before her MM diagnosis and is curious about getting it at this time. She has been trying to cut down on her pain medication. She is currently taking 3000 units of Vitamin D daily.   Lab results today (08/08/19) of CBC w/diff and CMP is as follows: all values are WNL except for RBC at 2.87, Hgb at 10.3, HCT at 30.2, MCV at 105.2, MCH at 35.9, Lymphs Abs at 0.1K, Sodium at 134, Glucose at 104, Calcium at 8.7, Total Protein at 6.1, AST at 14, Alkaline Phosphatase at 33. 08/08/2019 MMP is in progress   On review of  systems, pt reports back pain and denies leg swelling, abdominal pain and any other symptoms.    MEDICAL HISTORY:  Past Medical History:  Diagnosis Date   Cancer [facility])    multiple myeloma   Glaucoma    HYPERTENSION 03/11/2007   OSTEOPENIA 03/11/2007    SURGICAL HISTORY: Past Surgical History:  Procedure Laterality Date   BREAST EXCISIONAL BIOPSY Right 2000   BREAST LUMPECTOMY  1990   benign   DILATION AND CURETTAGE OF UTERUS     bleeding at menopause. No uterine cancer   IR RADIOLOGIST EVAL & MGMT  12/13/2018   TONSILLECTOMY     age 64    SOCIAL HISTORY: Social History   Socioeconomic History   Marital status: Married    Spouse name: Not on file   Number of children: Not on file   Years of education: Not on file   Highest education level: Not on file  Occupational History   Not on file  Social Needs   Financial resource strain: Not on file   Food insecurity  Worry: Not on file    Inability: Not on file   Transportation needs    Medical: Not on file    Non-medical: Not on file  Tobacco Use   Smoking status: Former Smoker    Packs/day: 0.50    Years: 7.00    Pack years: 3.50    Types: Cigarettes    Quit date: 01/04/1961    Years since quitting: 58.6   Smokeless tobacco: Never Used  Substance and Sexual Activity   Alcohol use: Yes    Alcohol/week: 1.0 standard drinks    Types: 1 Standard drinks or equivalent per week   Drug use: No   Sexual activity: Not Currently  Lifestyle   Physical activity    Days per week: Not on file    Minutes per session: Not on file   Stress: Not on file  Relationships   Social connections    Talks on phone: Not on file    Gets together: Not on file    Attends religious service: Not on file    Active member of club or organization: Not on file    Attends meetings of clubs or organizations: Not on file    Relationship status: Not on file   Intimate partner violence    Fear of current or ex partner:  Not on file    Emotionally abused: Not on file    Physically abused: Not on file    Forced sexual activity: Not on file  Other Topics Concern   Not on file  Social History Narrative   Married. Lives with husband (patient of Dr. Yong Channel). 1 son. No grandkids. 1 granddog.       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, swing dancing, dinner, read       FAMILY HISTORY: Family History  Problem Relation Age of Onset   Heart disease Mother        CHF mother died 67   Arthritis Mother    Glaucoma Mother        sister as well   Alcohol abuse Father    Suicidality Father    Cancer Sister        lung cancer, smoker   Heart disease Sister        aortic valve replacement   Hyperlipidemia Brother    Hypertension Brother    COPD Brother    Arthritis Sister    Hypertension Sister    Glaucoma Sister    Hashimoto's thyroiditis Sister    Hypertension Son    Stroke Maternal Grandmother    Colon cancer Neg Hx    Colon polyps Neg Hx     ALLERGIES:  is allergic to ace inhibitors; benadryl [diphenhydramine]; diamox [acetazolamide]; sulfamethoxazole; lenalidomide; and penicillins.   MEDICATIONS:  Current Outpatient Medications  Medication Sig Dispense Refill   acyclovir (ZOVIRAX) 400 MG tablet Take 1 tablet (400 mg total) by mouth 2 (two) times daily. 60 tablet 3   ALPHAGAN P 0.1 % SOLN Apply 1 drop topically See admin instructions. Apply 2 drops in right eye 3 times a day     amLODipine (NORVASC) 5 MG tablet Take 1 tablet (5 mg total) by mouth daily. 90 tablet 3   aspirin EC 81 MG tablet Take 81 mg by mouth daily.     bimatoprost (LUMIGAN) 0.03 % ophthalmic solution Place 1 drop into the right eye at bedtime.  Cholecalciferol (VITAMIN D3) 1000 UNITS CAPS Take 1,000 Units by mouth 2 (two) times daily.      co-enzyme Q-10 50 MG capsule Take 50 mg by mouth daily.        cyclophosphamide (CYTOXAN) 50 MG capsule TAKE 9 CAPSULES (450 MG TOTAL) BY MOUTH ONCE A WEEK. TAKE WITH FOOD TO MINIMIZE GI UPSET. TAKE EARLY IN THE DAY AND MAINTAIN HYDRATION. 36 capsule 2   dexamethasone (DECADRON) 4 MG tablet '20mg'$  (5 tabs) on Day 15 of each cycle of treatment 15 tablet 3   dorzolamide-timolol (COSOPT) 22.3-6.8 MG/ML ophthalmic solution Place 2 drops into both eyes 2 (two) times daily.   11   fentaNYL (DURAGESIC) 25 MCG/HR Place 1 patch onto the skin every 3 (three) days. 10 patch 0   hydrOXYzine (ATARAX/VISTARIL) 25 MG tablet Take 1 tablet (25 mg total) by mouth 3 (three) times daily as needed. 30 tablet 0   magnesium hydroxide (MILK OF MAGNESIA) 400 MG/5ML suspension Take 15 mLs by mouth daily as needed for mild constipation or moderate constipation. 355 mL 0   Magnesium Oxide 500 MG TABS Take 1 tablet by mouth daily.       Multiple Vitamins-Minerals (MULTIVITAMIN WITH MINERALS) tablet Take 1 tablet by mouth daily.       ondansetron (ZOFRAN) 8 MG tablet Take 1 tablet (8 mg total) by mouth 2 (two) times daily as needed (Nausea or vomiting). 30 tablet 1   oxyCODONE-acetaminophen (PERCOCET) 5-325 MG tablet Take 1-2 tablets by mouth every 4 (four) hours as needed for moderate pain or severe pain. 90 tablet 0   polyethylene glycol (MIRALAX) packet Take 17 g by mouth daily. 30 each 1   prochlorperazine (COMPAZINE) 10 MG tablet Take 1 tablet (10 mg total) by mouth every 6 (six) hours as needed (Nausea or vomiting). 30 tablet 1   senna-docusate (SENOKOT-S) 8.6-50 MG tablet Take 2 tablets by mouth at bedtime. 60 tablet 2   vitamin C (ASCORBIC ACID) 500 MG tablet Take 500 mg by mouth daily.     No current facility-administered medications for this visit.    Facility-Administered Medications Ordered in Other Visits  Medication Dose Route Frequency Provider Last Rate Last Dose   bortezomib SQ (VELCADE) chemo injection 2 mg  1.3 mg/m2 (Treatment Plan Recorded) Subcutaneous  Once [name], MD        REVIEW OF SYSTEMS:   A 10+ POINT REVIEW OF SYSTEMS WAS OBTAINED including neurology, dermatology, psychiatry, cardiac, respiratory, lymph, extremities, GI, GU, Musculoskeletal, constitutional, breasts, reproductive, HEENT.  All pertinent positives are noted in the HPI.  All others are negative.    PHYSICAL EXAMINATION: ECOG PERFORMANCE STATUS: 1-2  Vitals:   08/08/19 1340  BP: (!) 158/85  Pulse: 76  Resp: 18  Temp: 98.9 F (37.2 C)  SpO2: 99%   Filed Weights   08/08/19 1340  Weight: 105 lb 3.2 oz (47.7 kg)   Body mass index is 18.93 kg/m.   Exam was given in a wheelchair   GENERAL:alert, in no acute distress and comfortable SKIN: no acute rashes, no significant lesions EYES: conjunctiva are pink and non-injected, sclera anicteric OROPHARYNX: MMM, no exudates, no oropharyngeal erythema or ulceration NECK: supple, no JVD LYMPH:  no palpable lymphadenopathy in the cervical, axillary or inguinal regions LUNGS: clear to auscultation b/l with normal respiratory effort HEART: regular rate & rhythm ABDOMEN:  normoactive bowel sounds , non tender, not distended. No palpable hepatosplenomegaly.  Extremity: no pedal edema PSYCH: alert & oriented x  3 with fluent speech NEURO: no focal motor/sensory deficits  LABORATORY DATA:  I have reviewed the data as listed  . CBC Latest Ref Rng & Units 08/08/2019 07/28/2019 07/25/2019  WBC 4.0 - 10.5 K/uL 5.9 2.1(L) 2.4(L)  Hemoglobin 12.0 - 15.0 g/dL 10.3(L) 10.3(L) 9.8(L)  Hematocrit 36.0 - 46.0 % 30.2(L) 31.0(L) 29.5(L)  Platelets 150 - 400 K/uL 283 146(L) 153    . CMP Latest Ref Rng & Units 08/08/2019 07/28/2019 07/25/2019  Glucose 70 - 99 mg/dL 104(H) 96 117(H)  BUN 8 - 23 mg/dL '14 16 14  '$ Creatinine 0.44 - 1.00 mg/dL 0.63 0.65 0.63  Sodium 135 - 145 mmol/L 134(L) 139 139  Potassium 3.5 - 5.1 mmol/L 4.0 4.3 4.2  Chloride 98 - 111 mmol/L 99 103 103  CO2 22 - 32 mmol/L '25 28 26  '$ Calcium 8.9 -  10.3 mg/dL 8.7(L) 9.0 8.6(L)  Total Protein 6.5 - 8.1 g/dL 6.1(L) 6.1(L) 5.6(L)  Total Bilirubin 0.3 - 1.2 mg/dL 0.3 0.3 0.3  Alkaline Phos 38 - 126 U/L 33(L) 35(L) 32(L)  AST 15 - 41 U/L 14(L) 11(L) 13(L)  ALT 0 - 44 U/L '16 13 13   '$ 07/14/2019 Cytogenetics ([id number]):    07/14/2019 Flow Pathology Report ([id number]):   07/14/2019 BM Bx ([id number]):   Most recent MMP 04/25/2019 Component     Latest Ref Rng & Units 04/25/2019  IgG (Immunoglobin G), Serum     586 - 1,602 mg/dL 702  IgA     64 - 422 mg/dL 39 (L)  IgM (Immunoglobulin M), Srm     26 - 217 mg/dL 26  Total Protein ELP     6.0 - 8.5 g/dL 5.9 (L)  Albumin SerPl Elph-Mcnc     2.9 - 4.4 g/dL 3.8  Alpha 1     0.0 - 0.4 g/dL 0.2  Alpha2 Glob SerPl Elph-Mcnc     0.4 - 1.0 g/dL 0.7  B-Globulin SerPl Elph-Mcnc     0.7 - 1.3 g/dL 1.0  Gamma Glob SerPl Elph-Mcnc     0.4 - 1.8 g/dL 0.3 (L)  M Protein SerPl Elph-Mcnc     Not Observed g/dL 0.4 (H)  Globulin, Total     2.2 - 3.9 g/dL 2.1 (L)  Albumin/Glob SerPl     0.7 - 1.7 1.9 (H)  IFE 1      Comment  Please Note (HCV):      Comment    12/01/18 BM Bx:   12/01/18 Flow Cytometry:       RADIOGRAPHIC STUDIES: I have personally reviewed the radiological images as listed and agreed with the findings in the report. Nm Pet Image Restage (ps) Whole Body  Result Date: 07/17/2019 CLINICAL DATA:  Subsequent treatment strategy for multiple myeloma. EXAM: NUCLEAR MEDICINE PET WHOLE BODY TECHNIQUE: 5.8 mCi F-18 FDG was injected intravenously. Full-ring PET imaging was performed from the skull base to thigh after the radiotracer. CT data was obtained and used for attenuation correction and anatomic localization. Fasting blood glucose: 112 mg/dl COMPARISON:  12/22/2018 and CT abdomen pelvis 12/19/2018. FINDINGS: Mediastinal blood pool activity: SUV max 2.0 HEAD/NECK: No abnormal hypermetabolism. Incidental CT findings: None. CHEST: Persistent hypermetabolism in a  partially calcified 1.8 cm left thyroid nodule, SUV max 4.5. No hypermetabolic mediastinal, hilar or axillary lymph nodes. No hypermetabolic pulmonary nodules. Incidental CT findings: Atherosclerotic calcification of the aorta and coronary arteries. Heart is mildly enlarged. No pericardial or pleural effusion. ABDOMEN/PELVIS: No hypermetabolic lymph nodes. Incidental CT findings: 9 mm low-attenuation  lesion in the periphery of the left hepatic lobe is too small to characterize. Liver, gallbladder and adrenal glands are unremarkable. 11 mm hyperdense lesion in the lower pole right kidney is too small to characterize but a hyperdense cyst is likely. Left kidney, spleen pancreas, stomach and bowel are grossly unremarkable. Atherosclerotic calcification of the aorta. SKELETON: No abnormal osseous hypermetabolism. Incidental CT findings: Multiple compression deformities in the spine are better seen on prior diagnostic cross-sectional imaging. EXTREMITIES: No abnormal hypermetabolism. Incidental CT findings: None. IMPRESSION: 1. 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 2. Hypermetabolic calcified left thyroid nodule, as before, indeterminate. 3. Aortic atherosclerosis (ICD10-170.0). Coronary artery calcification. Electronically Signed   By: Lorin Picket M.D.   On: 07/17/2019 15:47   Ct Biopsy  Result Date: 07/14/2019 INDICATION: 78 year old female with a history of multiple myeloma status post induction therapy. Evaluate prior to bone marrow transplantation. EXAM: CT GUIDED BONE MARROW ASPIRATION AND CORE BIOPSY Interventional Radiologist:  Criselda Peaches, MD MEDICATIONS: None. ANESTHESIA/SEDATION: Moderate (conscious) sedation was employed during this procedure. A total of 2 milligrams versed and 100 micrograms fentanyl were administered intravenously. The patient's level of consciousness and vital signs were monitored continuously by radiology nursing  throughout the procedure under my direct supervision. Total monitored sedation time: 10 minutes FLUOROSCOPY TIME:  None. COMPLICATIONS: None immediate. Estimated blood loss: <25 mL PROCEDURE: Informed written consent was obtained from the patient after a thorough discussion of the procedural risks, benefits and alternatives. All questions were addressed. Maximal Sterile Barrier Technique was utilized including caps, mask, sterile gowns, sterile gloves, sterile drape, hand hygiene and skin antiseptic. A timeout was performed prior to the initiation of the procedure. The patient was positioned prone and non-contrast localization CT was performed of the pelvis to demonstrate the iliac marrow spaces. Maximal barrier sterile technique utilized including caps, mask, sterile gowns, sterile gloves, large sterile drape, hand hygiene, and betadine prep. Under sterile conditions and local anesthesia, an 11 gauge coaxial bone biopsy needle was advanced into the right iliac marrow space. Needle position was confirmed with CT imaging. Initially, bone marrow aspiration was performed. Next, the 11 gauge outer cannula was utilized to obtain a right iliac bone marrow core biopsy. Needle was removed. Hemostasis was obtained with compression. The patient tolerated the procedure well. Samples were prepared with the cytotechnologist. IMPRESSION: Technically successful CT-guided bone marrow biopsy and aspirate of the right iliac bone. Electronically Signed   By: Jacqulynn Cadet M.D.   On: 07/14/2019 13:16   Ct Bone Marrow Biopsy & Aspiration  Result Date: 07/14/2019 INDICATION: 78 year old female with a history of multiple myeloma status post induction therapy. Evaluate prior to bone marrow transplantation. EXAM: CT GUIDED BONE MARROW ASPIRATION AND CORE BIOPSY Interventional Radiologist:  Criselda Peaches, MD MEDICATIONS: None. ANESTHESIA/SEDATION: Moderate (conscious) sedation was employed during this procedure. A total of 2  milligrams versed and 100 micrograms fentanyl were administered intravenously. The patient's level of consciousness and vital signs were monitored continuously by radiology nursing throughout the procedure under my direct supervision. Total monitored sedation time: 10 minutes FLUOROSCOPY TIME:  None. COMPLICATIONS: None immediate. Estimated blood loss: <25 mL PROCEDURE: Informed written consent was obtained from the patient after a thorough discussion of the procedural risks, benefits and alternatives. All questions were addressed. Maximal Sterile Barrier Technique was utilized including caps, mask, sterile gowns, sterile gloves, sterile drape, hand hygiene and skin antiseptic. A timeout was performed prior to the initiation of the procedure.  The patient was positioned prone and non-contrast localization CT was performed of the pelvis to demonstrate the iliac marrow spaces. Maximal barrier sterile technique utilized including caps, mask, sterile gowns, sterile gloves, large sterile drape, hand hygiene, and betadine prep. Under sterile conditions and local anesthesia, an 11 gauge coaxial bone biopsy needle was advanced into the right iliac marrow space. Needle position was confirmed with CT imaging. Initially, bone marrow aspiration was performed. Next, the 11 gauge outer cannula was utilized to obtain a right iliac bone marrow core biopsy. Needle was removed. Hemostasis was obtained with compression. The patient tolerated the procedure well. Samples were prepared with the cytotechnologist. IMPRESSION: Technically successful CT-guided bone marrow biopsy and aspirate of the right iliac bone. Electronically Signed   By: Jacqulynn Cadet M.D.   On: 07/14/2019 13:16     ASSESSMENT & PLAN:  78 y.o. female with  1. Multiple Myeloma with IgG Lambda specificity Labs upon initial presentation from 12/01/18 CBC w/diff revealed HGB at 10.4 with MCV of 105.7. 11/28/18 CMP revealed Creatinine normal at 0.68 and Calcium  normal at 8.9. 11/28/18 Beta 2 microglobulin at 2.'6mg'$  (also a reading at 4.'7mg'$  on the same day). 11/30/18 24HR UPEP revealed M spike at 75.1%. 11/28/18 SPEP revealed M spike of 4.6g. 11/28/18 Immunoglobulins revealed IgG elevated at '6181mg'$ .  12/01/18 BM Bx revealed hypercellular bone marrow with 70-80% CD138 immunohistochemistry (44% aspirate) lambda-restricted plasma cells as well as a kappa restricted population of B-cells 11/27/18 MRI Thoracic Spine which revealed Multifocal marrow signal abnormality consistent with metastatic disease or multiple myeloma. The patient has multiple compression fractures most consistent with pathologic injuries. Extensive marrow signal abnormality makes determining age of the fractures difficult but edema is most intense in T3. 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 2. Areas of hypermetabolic disease identified in both lungs suggesting multiple myeloma involvement. 3. 1.9 cm calcified left thyroid nodule is hypermetabolic, but Indeterminate. 4. Colon is diffusely distended with gas and stool. Imaging features would be compatible with clinical constipation. 5.  Aortic Atherosclerois.  Pt describes grade II to III rash on her re-challenge from Revlimid with optimized pre-medications, and we discontinued Revlimid  Began Cytoxan with C4 Velcade and Dexamethasone  05/16/2019 M spike is down to 0.2g/dl showing continued improvement and a 90% reduction  07/17/2019 PET/CT Whole Body Scan ([id number]) revealed "1. 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 2. Hypermetabolic  calcified left thyroid nodule, as before, indeterminate. 3. Aortic atherosclerosis (ICD10-170.0). Coronary artery calcification."  -07/14/2019 BM Bx revealed BONE MARROW: - Hypercellular marrow with residual plasma cell neoplasm (<10%) PERIPHERAL BLOOD: - Pancytopenia".   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 She does not currently have elevated lymphocyte counts on peripheral blood draws.   PLAN: -Discussed pt labwork today, 08/08/19; all values are WNL except for RBC at 2.87, Hgb at 10.3, HCT at 30.2, MCV at 105.2, MCH at 35.9, Lymphs Abs at 0.1K, Sodium at 134, Glucose at 104, Calcium at 8.7, Total Protein  at 6.1, AST at 14, Alkaline Phosphatase at 33. -07/28/2019 MMP is as follows: all values are WNL except for IgG at 405, IgA at 29, IgM at 10, Total Protein at 5.8, Gamma Glob at 0.1, M Protein at 0.2, Total Globulin at 1.7, Albumin/Glob at 2.5 -Discussed 08/08/2019 MMP is in progress  -M Protein steady at 0.2 g/dL -Pt may need a referral to an Endocrinologist for thyroid nodule  -Advised that the typical work-up for pt's thyroid nodule would include [place], Bx -The pt has no prohibitive toxicities from continuing C12D1 Dexamethazone, Bortezomib, Cytoxan and Denosumab at this time.  -Will consider a referral to orthapedic physician or PT in next visit  -Advised pt that it is okay for her to go to the dentist for a teeth cleaning -Will discontinue steroids to minimize side effects -After C14 will consider a repeat BM Bx  -Will consider Velcade or Ninlaro every 2 weeks for maintenance  -Plan to switch from Xgeva to Zometa during maintenance -Will check TSH and Vitamin D with next labs  -Refill Cytoxan  FOLLOW UP: plz schedule next 2 cycles of CyBOrD as per orders. Labs with D1 and D8 of each cycle.  RTC  with Dr Irene Limbo with C13D1 of treatment  The total time spent in the appt was 30 minutes and more than 50% was on counseling and direct patient cares.  All of the patient's questions were answered with apparent satisfaction. The patient knows to call the clinic with any problems, questions or concerns.   Sullivan Lone MD [place] AAHIVMS [facility] Hematology/Oncology Physician [facility]  (Office):       [phone] (Work cell):  [phone] (Fax):           [phone]  08/08/2019 3:26 PM  I, Yevette Edwards, am acting as a scribe for Dr. Sullivan Lone.   .I have reviewed the above documentation for accuracy and completeness, and I agree with the above. [name] MD

## 2019-08-08 NOTE — Patient Instructions (Signed)
[facility] Discharge Instructions for Patients Receiving Chemotherapy  Today you received the following chemotherapy agents Velcade.  To help prevent nausea and vomiting after your treatment, we encourage you to take your nausea medication as directed.  If you develop nausea and vomiting that is not controlled by your nausea medication, call the clinic.   BELOW ARE SYMPTOMS THAT SHOULD BE REPORTED IMMEDIATELY:  *FEVER GREATER THAN 100.5 F  *CHILLS WITH OR WITHOUT FEVER  NAUSEA AND VOMITING THAT IS NOT CONTROLLED WITH YOUR NAUSEA MEDICATION  *UNUSUAL SHORTNESS OF BREATH  *UNUSUAL BRUISING OR BLEEDING  TENDERNESS IN MOUTH AND THROAT WITH OR WITHOUT PRESENCE OF ULCERS  *URINARY PROBLEMS  *BOWEL PROBLEMS  UNUSUAL RASH Items with * indicate a potential emergency and should be followed up as soon as possible.  Feel free to call the clinic should you have any questions or concerns. The clinic phone number is [phone].  Please show the CHEMO ALERT CARD at check-in to the Emergency Department and triage nurse.   

## 2019-08-10 ENCOUNTER — Other Ambulatory Visit: Payer: Self-pay | Admitting: Hematology

## 2019-08-10 DIAGNOSIS — Z7189 Other specified counseling: Secondary | ICD-10-CM

## 2019-08-10 DIAGNOSIS — C9000 Multiple myeloma not having achieved remission: Secondary | ICD-10-CM

## 2019-08-10 LAB — MULTIPLE MYELOMA PANEL, SERUM
Albumin SerPl Elph-Mcnc: 3.9 g/dL (ref 2.9–4.4)
Albumin/Glob SerPl: 2.1 — ABNORMAL HIGH (ref 0.7–1.7)
Alpha 1: 0.3 g/dL (ref 0.0–0.4)
Alpha2 Glob SerPl Elph-Mcnc: 0.6 g/dL (ref 0.4–1.0)
B-Globulin SerPl Elph-Mcnc: 0.9 g/dL (ref 0.7–1.3)
Gamma Glob SerPl Elph-Mcnc: 0.1 g/dL — ABNORMAL LOW (ref 0.4–1.8)
Globulin, Total: 1.9 g/dL — ABNORMAL LOW (ref 2.2–3.9)
IgA: 29 mg/dL — ABNORMAL LOW (ref 64–422)
IgG (Immunoglobin G), Serum: 377 mg/dL — ABNORMAL LOW (ref 586–1602)
IgM (Immunoglobulin M), Srm: 9 mg/dL — ABNORMAL LOW (ref 26–217)
M Protein SerPl Elph-Mcnc: 0.2 g/dL — ABNORMAL HIGH
Total Protein ELP: 5.8 g/dL — ABNORMAL LOW (ref 6.0–8.5)

## 2019-08-11 ENCOUNTER — Inpatient Hospital Stay: Payer: Medicare HMO

## 2019-08-11 ENCOUNTER — Other Ambulatory Visit: Payer: Self-pay

## 2019-08-11 VITALS — BP 154/76 | HR 65 | Temp 98.5°F | Resp 16

## 2019-08-11 DIAGNOSIS — K5900 Constipation, unspecified: Secondary | ICD-10-CM | POA: Diagnosis not present

## 2019-08-11 DIAGNOSIS — C9000 Multiple myeloma not having achieved remission: Secondary | ICD-10-CM

## 2019-08-11 DIAGNOSIS — R21 Rash and other nonspecific skin eruption: Secondary | ICD-10-CM | POA: Diagnosis not present

## 2019-08-11 DIAGNOSIS — I10 Essential (primary) hypertension: Secondary | ICD-10-CM | POA: Diagnosis not present

## 2019-08-11 DIAGNOSIS — M7989 Other specified soft tissue disorders: Secondary | ICD-10-CM | POA: Diagnosis not present

## 2019-08-11 DIAGNOSIS — M25473 Effusion, unspecified ankle: Secondary | ICD-10-CM | POA: Diagnosis not present

## 2019-08-11 DIAGNOSIS — M545 Low back pain: Secondary | ICD-10-CM | POA: Diagnosis not present

## 2019-08-11 DIAGNOSIS — Z5111 Encounter for antineoplastic chemotherapy: Secondary | ICD-10-CM | POA: Diagnosis not present

## 2019-08-11 DIAGNOSIS — Z7189 Other specified counseling: Secondary | ICD-10-CM

## 2019-08-11 DIAGNOSIS — D61818 Other pancytopenia: Secondary | ICD-10-CM | POA: Diagnosis not present

## 2019-08-11 DIAGNOSIS — I700 Atherosclerosis of aorta: Secondary | ICD-10-CM | POA: Diagnosis not present

## 2019-08-11 MED ORDER — PROCHLORPERAZINE MALEATE 10 MG PO TABS
10.0000 mg | ORAL_TABLET | Freq: Once | ORAL | Status: AC
  Administered 2019-08-11: 10 mg via ORAL

## 2019-08-11 MED ORDER — BORTEZOMIB CHEMO SQ INJECTION 3.5 MG (2.5MG/ML)
1.3000 mg/m2 | Freq: Once | INTRAMUSCULAR | Status: AC
  Administered 2019-08-11: 2 mg via SUBCUTANEOUS
  Filled 2019-08-11: qty 0.8

## 2019-08-11 MED ORDER — PROCHLORPERAZINE MALEATE 10 MG PO TABS
ORAL_TABLET | ORAL | Status: AC
  Filled 2019-08-11: qty 1

## 2019-08-14 ENCOUNTER — Other Ambulatory Visit: Payer: Self-pay | Admitting: Hematology

## 2019-08-14 DIAGNOSIS — C9000 Multiple myeloma not having achieved remission: Secondary | ICD-10-CM

## 2019-08-15 ENCOUNTER — Other Ambulatory Visit: Payer: Self-pay

## 2019-08-15 ENCOUNTER — Inpatient Hospital Stay: Payer: Medicare HMO

## 2019-08-15 ENCOUNTER — Other Ambulatory Visit: Payer: Self-pay | Admitting: Hematology

## 2019-08-15 VITALS — BP 152/84 | HR 65 | Temp 98.5°F | Resp 18

## 2019-08-15 DIAGNOSIS — I10 Essential (primary) hypertension: Secondary | ICD-10-CM | POA: Diagnosis not present

## 2019-08-15 DIAGNOSIS — Z7189 Other specified counseling: Secondary | ICD-10-CM

## 2019-08-15 DIAGNOSIS — Z5111 Encounter for antineoplastic chemotherapy: Secondary | ICD-10-CM

## 2019-08-15 DIAGNOSIS — R21 Rash and other nonspecific skin eruption: Secondary | ICD-10-CM | POA: Diagnosis not present

## 2019-08-15 DIAGNOSIS — D61818 Other pancytopenia: Secondary | ICD-10-CM | POA: Diagnosis not present

## 2019-08-15 DIAGNOSIS — C9000 Multiple myeloma not having achieved remission: Secondary | ICD-10-CM

## 2019-08-15 DIAGNOSIS — M25473 Effusion, unspecified ankle: Secondary | ICD-10-CM | POA: Diagnosis not present

## 2019-08-15 DIAGNOSIS — M7989 Other specified soft tissue disorders: Secondary | ICD-10-CM | POA: Diagnosis not present

## 2019-08-15 DIAGNOSIS — K5900 Constipation, unspecified: Secondary | ICD-10-CM | POA: Diagnosis not present

## 2019-08-15 DIAGNOSIS — M545 Low back pain: Secondary | ICD-10-CM | POA: Diagnosis not present

## 2019-08-15 DIAGNOSIS — I700 Atherosclerosis of aorta: Secondary | ICD-10-CM | POA: Diagnosis not present

## 2019-08-15 LAB — CBC WITH DIFFERENTIAL/PLATELET
Abs Immature Granulocytes: 0.01 10*3/uL (ref 0.00–0.07)
Basophils Absolute: 0 10*3/uL (ref 0.0–0.1)
Basophils Relative: 0 %
Eosinophils Absolute: 0.1 10*3/uL (ref 0.0–0.5)
Eosinophils Relative: 3 %
HCT: 29.6 % — ABNORMAL LOW (ref 36.0–46.0)
Hemoglobin: 9.8 g/dL — ABNORMAL LOW (ref 12.0–15.0)
Immature Granulocytes: 0 %
Lymphocytes Relative: 4 %
Lymphs Abs: 0.1 10*3/uL — ABNORMAL LOW (ref 0.7–4.0)
MCH: 35.4 pg — ABNORMAL HIGH (ref 26.0–34.0)
MCHC: 33.1 g/dL (ref 30.0–36.0)
MCV: 106.9 fL — ABNORMAL HIGH (ref 80.0–100.0)
Monocytes Absolute: 0.3 10*3/uL (ref 0.1–1.0)
Monocytes Relative: 11 %
Neutro Abs: 1.9 10*3/uL (ref 1.7–7.7)
Neutrophils Relative %: 82 %
Platelets: 155 10*3/uL (ref 150–400)
RBC: 2.77 MIL/uL — ABNORMAL LOW (ref 3.87–5.11)
RDW: 13.9 % (ref 11.5–15.5)
WBC: 2.4 10*3/uL — ABNORMAL LOW (ref 4.0–10.5)
nRBC: 0 % (ref 0.0–0.2)

## 2019-08-15 LAB — CMP (CANCER CENTER ONLY)
ALT: 14 U/L (ref 0–44)
AST: 14 U/L — ABNORMAL LOW (ref 15–41)
Albumin: 3.7 g/dL (ref 3.5–5.0)
Alkaline Phosphatase: 31 U/L — ABNORMAL LOW (ref 38–126)
Anion gap: 9 (ref 5–15)
BUN: 14 mg/dL (ref 8–23)
CO2: 28 mmol/L (ref 22–32)
Calcium: 8.6 mg/dL — ABNORMAL LOW (ref 8.9–10.3)
Chloride: 104 mmol/L (ref 98–111)
Creatinine: 0.63 mg/dL (ref 0.44–1.00)
GFR, Est AFR Am: >60 mL/min (ref >60)
GFR, Estimated: >60 mL/min (ref >60)
Glucose, Bld: 110 mg/dL — ABNORMAL HIGH (ref 70–99)
Potassium: 4.3 mmol/L (ref 3.5–5.1)
Sodium: 141 mmol/L (ref 135–145)
Total Bilirubin: 0.2 mg/dL — ABNORMAL LOW (ref 0.3–1.2)
Total Protein: 5.7 g/dL — ABNORMAL LOW (ref 6.5–8.1)

## 2019-08-15 MED ORDER — DEXAMETHASONE 4 MG PO TABS
20.0000 mg | ORAL_TABLET | Freq: Once | ORAL | Status: AC
  Administered 2019-08-15: 09:00:00 20 mg via ORAL

## 2019-08-15 MED ORDER — PROCHLORPERAZINE MALEATE 10 MG PO TABS
10.0000 mg | ORAL_TABLET | Freq: Once | ORAL | Status: AC
  Administered 2019-08-15: 10 mg via ORAL

## 2019-08-15 MED ORDER — BORTEZOMIB CHEMO SQ INJECTION 3.5 MG (2.5MG/ML)
1.3000 mg/m2 | Freq: Once | INTRAMUSCULAR | Status: AC
  Administered 2019-08-15: 10:00:00 2 mg via SUBCUTANEOUS
  Filled 2019-08-15: qty 0.8

## 2019-08-15 MED ORDER — DEXAMETHASONE 4 MG PO TABS
ORAL_TABLET | ORAL | Status: AC
  Filled 2019-08-15: qty 5

## 2019-08-15 MED ORDER — PROCHLORPERAZINE MALEATE 10 MG PO TABS
ORAL_TABLET | ORAL | Status: AC
  Filled 2019-08-15: qty 1

## 2019-08-15 MED ORDER — FENTANYL 25 MCG/HR TD PT72: 1.0000 | MEDICATED_PATCH | TRANSDERMAL | 0 refills | Status: DC

## 2019-08-15 NOTE — Patient Instructions (Signed)
[facility] Discharge Instructions for Patients Receiving Chemotherapy  Today you received the following chemotherapy agents Velcade.  To help prevent nausea and vomiting after your treatment, we encourage you to take your nausea medication as directed.  If you develop nausea and vomiting that is not controlled by your nausea medication, call the clinic.   BELOW ARE SYMPTOMS THAT SHOULD BE REPORTED IMMEDIATELY:  *FEVER GREATER THAN 100.5 F  *CHILLS WITH OR WITHOUT FEVER  NAUSEA AND VOMITING THAT IS NOT CONTROLLED WITH YOUR NAUSEA MEDICATION  *UNUSUAL SHORTNESS OF BREATH  *UNUSUAL BRUISING OR BLEEDING  TENDERNESS IN MOUTH AND THROAT WITH OR WITHOUT PRESENCE OF ULCERS  *URINARY PROBLEMS  *BOWEL PROBLEMS  UNUSUAL RASH Items with * indicate a potential emergency and should be followed up as soon as possible.  Feel free to call the clinic should you have any questions or concerns. The clinic phone number is [phone].  Please show the CHEMO ALERT CARD at check-in to the Emergency Department and triage nurse.   

## 2019-08-16 DIAGNOSIS — R69 Illness, unspecified: Secondary | ICD-10-CM | POA: Diagnosis not present

## 2019-08-16 MED FILL — CYCLOPHOSPHAMIDE 50 MG CAPS: 50 | 28 days supply | Qty: 36 | Fill #0

## 2019-08-18 ENCOUNTER — Inpatient Hospital Stay: Payer: Medicare HMO

## 2019-08-18 ENCOUNTER — Other Ambulatory Visit: Payer: Self-pay

## 2019-08-18 VITALS — BP 154/89 | HR 62 | Temp 98.7°F | Resp 17

## 2019-08-18 DIAGNOSIS — Z5111 Encounter for antineoplastic chemotherapy: Secondary | ICD-10-CM | POA: Diagnosis not present

## 2019-08-18 DIAGNOSIS — I10 Essential (primary) hypertension: Secondary | ICD-10-CM | POA: Diagnosis not present

## 2019-08-18 DIAGNOSIS — D61818 Other pancytopenia: Secondary | ICD-10-CM | POA: Diagnosis not present

## 2019-08-18 DIAGNOSIS — M7989 Other specified soft tissue disorders: Secondary | ICD-10-CM | POA: Diagnosis not present

## 2019-08-18 DIAGNOSIS — Z7189 Other specified counseling: Secondary | ICD-10-CM

## 2019-08-18 DIAGNOSIS — I700 Atherosclerosis of aorta: Secondary | ICD-10-CM | POA: Diagnosis not present

## 2019-08-18 DIAGNOSIS — R21 Rash and other nonspecific skin eruption: Secondary | ICD-10-CM | POA: Diagnosis not present

## 2019-08-18 DIAGNOSIS — C9000 Multiple myeloma not having achieved remission: Secondary | ICD-10-CM

## 2019-08-18 DIAGNOSIS — M25473 Effusion, unspecified ankle: Secondary | ICD-10-CM | POA: Diagnosis not present

## 2019-08-18 DIAGNOSIS — K5900 Constipation, unspecified: Secondary | ICD-10-CM | POA: Diagnosis not present

## 2019-08-18 DIAGNOSIS — M545 Low back pain: Secondary | ICD-10-CM | POA: Diagnosis not present

## 2019-08-18 MED ORDER — PROCHLORPERAZINE MALEATE 10 MG PO TABS
10.0000 mg | ORAL_TABLET | Freq: Once | ORAL | Status: AC
  Administered 2019-08-18: 09:00:00 10 mg via ORAL

## 2019-08-18 MED ORDER — PROCHLORPERAZINE MALEATE 10 MG PO TABS
ORAL_TABLET | ORAL | Status: AC
  Filled 2019-08-18: qty 1

## 2019-08-18 MED ORDER — BORTEZOMIB CHEMO SQ INJECTION 3.5 MG (2.5MG/ML)
1.3000 mg/m2 | Freq: Once | INTRAMUSCULAR | Status: AC
  Administered 2019-08-18: 2 mg via SUBCUTANEOUS
  Filled 2019-08-18: qty 0.8

## 2019-08-18 NOTE — Patient Instructions (Signed)
[facility] Discharge Instructions for Patients Receiving Chemotherapy  Today you received the following chemotherapy agents: Bortezomib (Velcade)  To help prevent nausea and vomiting after your treatment, we encourage you to take your nausea medication as directed.   If you develop nausea and vomiting that is not controlled by your nausea medication, call the clinic.   BELOW ARE SYMPTOMS THAT SHOULD BE REPORTED IMMEDIATELY:  *FEVER GREATER THAN 100.5 F  *CHILLS WITH OR WITHOUT FEVER  NAUSEA AND VOMITING THAT IS NOT CONTROLLED WITH YOUR NAUSEA MEDICATION  *UNUSUAL SHORTNESS OF BREATH  *UNUSUAL BRUISING OR BLEEDING  TENDERNESS IN MOUTH AND THROAT WITH OR WITHOUT PRESENCE OF ULCERS  *URINARY PROBLEMS  *BOWEL PROBLEMS  UNUSUAL RASH Items with * indicate a potential emergency and should be followed up as soon as possible.  Feel free to call the clinic should you have any questions or concerns. The clinic phone number is [phone].  Please show the CHEMO ALERT CARD at check-in to the Emergency Department and triage nurse.  Coronavirus (COVID-19) Are you at risk?  Are you at risk for the Coronavirus (COVID-19)?  To be considered HIGH RISK for Coronavirus (COVID-19), you have to meet the following criteria:  . Traveled to [place], [place], [place], [place] or [place]; or in the [place] to [place], [place], [place], or [place]; and have fever, cough, and shortness of breath within the last 2 weeks of travel OR . Been in close contact with a person diagnosed with COVID-19 within the last 2 weeks and have fever, cough, and shortness of breath . IF YOU DO NOT MEET THESE CRITERIA, YOU ARE CONSIDERED LOW RISK FOR COVID-19.  What to do if you are HIGH RISK for COVID-19?  . If you are having a medical emergency, call 911. . Seek medical care right away. Before you go to a doctor's office, urgent care or emergency department, call ahead and tell them  about your recent travel, contact with someone diagnosed with COVID-19, and your symptoms. You should receive instructions from your physician's office regarding next steps of care.  . When you arrive at healthcare provider, tell the healthcare staff immediately you have returned from visiting [place], [place], [place], [place] or [place]; or traveled in the [place] to [place], [place], [place], or [place]; in the last two weeks or you have been in close contact with a person diagnosed with COVID-19 in the last 2 weeks.   . Tell the health care staff about your symptoms: fever, cough and shortness of breath. . After you have been seen by a medical provider, you will be either: o Tested for (COVID-19) and discharged home on quarantine except to seek medical care if symptoms worsen, and asked to  - Stay home and avoid contact with others until you get your results (4-5 days)  - Avoid travel on public transportation if possible (such as bus, train, or airplane) or o Sent to the Emergency Department by EMS for evaluation, COVID-19 testing, and possible admission depending on your condition and test results.  What to do if you are LOW RISK for COVID-19?  Reduce your risk of any infection by using the same precautions used for avoiding the common cold or flu:  . Wash your hands often with soap and warm water for at least 20 seconds.  If soap and water are not readily available, use an alcohol-based hand sanitizer with at least 60% alcohol.  . If coughing or   sneezing, cover your mouth and nose by coughing or sneezing into the elbow areas of your shirt or coat, into a tissue or into your sleeve (not your hands). . Avoid shaking hands with others and consider head nods or verbal greetings only. . Avoid touching your eyes, nose, or mouth with unwashed hands.  . Avoid close contact with people who are sick. . Avoid places or events with large numbers of people in one location, like concerts or  sporting events. . Carefully consider travel plans you have or are making. . If you are planning any travel outside or inside the [place], visit the CDC's Travelers' Health webpage for the latest health notices. . If you have some symptoms but not all symptoms, continue to monitor at home and seek medical attention if your symptoms worsen. . If you are having a medical emergency, call 911.   ADDITIONAL HEALTHCARE OPTIONS FOR PATIENTS  [facility] / e-Visit: [email or web address]         [facility]: [phone]  [facility]: [phone]                   [facility]: [phone]   

## 2019-08-22 ENCOUNTER — Telehealth: Payer: Self-pay | Admitting: *Deleted

## 2019-08-22 NOTE — Telephone Encounter (Signed)
Patient called with several questions and concerns: Her dentist office will fax a note requesting info on antibiotics prior to teeth cleaning; she asked if she should continue dexamethasone as ordered or stop; questions related to schedule. Per Dr.Kale - antibiotics will be needed and he will complete form; dexamethasone is to be continued; labs needed on D1 and D8 of cycle. Contacted patient - reviewed answers to questions - will fax Dr.Kale response to her dentist and cancelled lab appt on D4 of cycle. Patient verbalized understanding.

## 2019-08-24 ENCOUNTER — Other Ambulatory Visit: Payer: Self-pay | Admitting: *Deleted

## 2019-08-24 MED ORDER — OXYCODONE-ACETAMINOPHEN 5-325 MG PO TABS: 1.0000 | ORAL_TABLET | ORAL | 0 refills | Status: DC | PRN

## 2019-08-24 NOTE — Telephone Encounter (Signed)
reqested refill for pain med

## 2019-08-29 ENCOUNTER — Inpatient Hospital Stay: Payer: Medicare HMO

## 2019-08-29 ENCOUNTER — Other Ambulatory Visit: Payer: Self-pay

## 2019-08-29 VITALS — BP 165/85 | HR 75 | Temp 98.5°F | Resp 16

## 2019-08-29 DIAGNOSIS — Z5111 Encounter for antineoplastic chemotherapy: Secondary | ICD-10-CM

## 2019-08-29 DIAGNOSIS — R21 Rash and other nonspecific skin eruption: Secondary | ICD-10-CM | POA: Diagnosis not present

## 2019-08-29 DIAGNOSIS — C9000 Multiple myeloma not having achieved remission: Secondary | ICD-10-CM

## 2019-08-29 DIAGNOSIS — K5900 Constipation, unspecified: Secondary | ICD-10-CM | POA: Diagnosis not present

## 2019-08-29 DIAGNOSIS — D61818 Other pancytopenia: Secondary | ICD-10-CM | POA: Diagnosis not present

## 2019-08-29 DIAGNOSIS — I700 Atherosclerosis of aorta: Secondary | ICD-10-CM | POA: Diagnosis not present

## 2019-08-29 DIAGNOSIS — M7989 Other specified soft tissue disorders: Secondary | ICD-10-CM | POA: Diagnosis not present

## 2019-08-29 DIAGNOSIS — M25473 Effusion, unspecified ankle: Secondary | ICD-10-CM | POA: Diagnosis not present

## 2019-08-29 DIAGNOSIS — M545 Low back pain: Secondary | ICD-10-CM | POA: Diagnosis not present

## 2019-08-29 DIAGNOSIS — I10 Essential (primary) hypertension: Secondary | ICD-10-CM | POA: Diagnosis not present

## 2019-08-29 DIAGNOSIS — Z7189 Other specified counseling: Secondary | ICD-10-CM

## 2019-08-29 LAB — CBC WITH DIFFERENTIAL/PLATELET
Abs Immature Granulocytes: 0.02 10*3/uL (ref 0.00–0.07)
Basophils Absolute: 0.1 10*3/uL (ref 0.0–0.1)
Basophils Relative: 1 %
Eosinophils Absolute: 0.1 10*3/uL (ref 0.0–0.5)
Eosinophils Relative: 2 %
HCT: 30.5 % — ABNORMAL LOW (ref 36.0–46.0)
Hemoglobin: 10.1 g/dL — ABNORMAL LOW (ref 12.0–15.0)
Immature Granulocytes: 1 %
Lymphocytes Relative: 2 %
Lymphs Abs: 0.1 10*3/uL — ABNORMAL LOW (ref 0.7–4.0)
MCH: 35.7 pg — ABNORMAL HIGH (ref 26.0–34.0)
MCHC: 33.1 g/dL (ref 30.0–36.0)
MCV: 107.8 fL — ABNORMAL HIGH (ref 80.0–100.0)
Monocytes Absolute: 0.4 10*3/uL (ref 0.1–1.0)
Monocytes Relative: 11 %
Neutro Abs: 3.1 10*3/uL (ref 1.7–7.7)
Neutrophils Relative %: 83 %
Platelets: 295 10*3/uL (ref 150–400)
RBC: 2.83 MIL/uL — ABNORMAL LOW (ref 3.87–5.11)
RDW: 14.3 % (ref 11.5–15.5)
WBC: 3.8 10*3/uL — ABNORMAL LOW (ref 4.0–10.5)
nRBC: 0 % (ref 0.0–0.2)

## 2019-08-29 LAB — CMP (CANCER CENTER ONLY)
ALT: 17 U/L (ref 0–44)
AST: 16 U/L (ref 15–41)
Albumin: 3.8 g/dL (ref 3.5–5.0)
Alkaline Phosphatase: 34 U/L — ABNORMAL LOW (ref 38–126)
Anion gap: 8 (ref 5–15)
BUN: 14 mg/dL (ref 8–23)
CO2: 30 mmol/L (ref 22–32)
Calcium: 8.8 mg/dL — ABNORMAL LOW (ref 8.9–10.3)
Chloride: 104 mmol/L (ref 98–111)
Creatinine: 0.62 mg/dL (ref 0.44–1.00)
GFR, Est AFR Am: >60 mL/min (ref >60)
GFR, Estimated: >60 mL/min (ref >60)
Glucose, Bld: 96 mg/dL (ref 70–99)
Potassium: 4.2 mmol/L (ref 3.5–5.1)
Sodium: 142 mmol/L (ref 135–145)
Total Bilirubin: 0.3 mg/dL (ref 0.3–1.2)
Total Protein: 6 g/dL — ABNORMAL LOW (ref 6.5–8.1)

## 2019-08-29 MED ORDER — DENOSUMAB 120 MG/1.7ML ~~LOC~~ SOLN
120.0000 mg | Freq: Once | SUBCUTANEOUS | Status: AC
  Administered 2019-08-29: 120 mg via SUBCUTANEOUS

## 2019-08-29 MED ORDER — DEXAMETHASONE 4 MG PO TABS
20.0000 mg | ORAL_TABLET | Freq: Once | ORAL | Status: AC
  Administered 2019-08-29: 20 mg via ORAL

## 2019-08-29 MED ORDER — BORTEZOMIB CHEMO SQ INJECTION 3.5 MG (2.5MG/ML)
1.3000 mg/m2 | Freq: Once | INTRAMUSCULAR | Status: AC
  Administered 2019-08-29: 2 mg via SUBCUTANEOUS
  Filled 2019-08-29: qty 0.8

## 2019-08-29 MED ORDER — DEXAMETHASONE 4 MG PO TABS
ORAL_TABLET | ORAL | Status: AC
  Filled 2019-08-29: qty 5

## 2019-08-29 MED ORDER — DENOSUMAB 120 MG/1.7ML ~~LOC~~ SOLN
SUBCUTANEOUS | Status: AC
  Filled 2019-08-29: qty 1.7

## 2019-08-29 MED ORDER — PROCHLORPERAZINE MALEATE 10 MG PO TABS
ORAL_TABLET | ORAL | Status: AC
  Filled 2019-08-29: qty 1

## 2019-08-29 MED ORDER — PROCHLORPERAZINE MALEATE 10 MG PO TABS
10.0000 mg | ORAL_TABLET | Freq: Once | ORAL | Status: AC
  Administered 2019-08-29: 10 mg via ORAL

## 2019-08-29 NOTE — Patient Instructions (Signed)
[facility] Discharge Instructions for Patients Receiving Chemotherapy  Today you received the following chemotherapy agents Velcade.  To help prevent nausea and vomiting after your treatment, we encourage you to take your nausea medication as directed.  If you develop nausea and vomiting that is not controlled by your nausea medication, call the clinic.   BELOW ARE SYMPTOMS THAT SHOULD BE REPORTED IMMEDIATELY:  *FEVER GREATER THAN 100.5 F  *CHILLS WITH OR WITHOUT FEVER  NAUSEA AND VOMITING THAT IS NOT CONTROLLED WITH YOUR NAUSEA MEDICATION  *UNUSUAL SHORTNESS OF BREATH  *UNUSUAL BRUISING OR BLEEDING  TENDERNESS IN MOUTH AND THROAT WITH OR WITHOUT PRESENCE OF ULCERS  *URINARY PROBLEMS  *BOWEL PROBLEMS  UNUSUAL RASH Items with * indicate a potential emergency and should be followed up as soon as possible.  Feel free to call the clinic should you have any questions or concerns. The clinic phone number is [phone].  Please show the CHEMO ALERT CARD at check-in to the Emergency Department and triage nurse.   

## 2019-09-01 ENCOUNTER — Inpatient Hospital Stay: Payer: Medicare HMO

## 2019-09-01 ENCOUNTER — Other Ambulatory Visit: Payer: Medicare HMO

## 2019-09-01 ENCOUNTER — Other Ambulatory Visit: Payer: Self-pay

## 2019-09-01 VITALS — BP 160/81 | HR 70 | Temp 98.3°F | Resp 16

## 2019-09-01 DIAGNOSIS — C9000 Multiple myeloma not having achieved remission: Secondary | ICD-10-CM

## 2019-09-01 DIAGNOSIS — M25473 Effusion, unspecified ankle: Secondary | ICD-10-CM | POA: Diagnosis not present

## 2019-09-01 DIAGNOSIS — D61818 Other pancytopenia: Secondary | ICD-10-CM | POA: Diagnosis not present

## 2019-09-01 DIAGNOSIS — K5900 Constipation, unspecified: Secondary | ICD-10-CM | POA: Diagnosis not present

## 2019-09-01 DIAGNOSIS — M7989 Other specified soft tissue disorders: Secondary | ICD-10-CM | POA: Diagnosis not present

## 2019-09-01 DIAGNOSIS — Z7189 Other specified counseling: Secondary | ICD-10-CM

## 2019-09-01 DIAGNOSIS — I700 Atherosclerosis of aorta: Secondary | ICD-10-CM | POA: Diagnosis not present

## 2019-09-01 DIAGNOSIS — R21 Rash and other nonspecific skin eruption: Secondary | ICD-10-CM | POA: Diagnosis not present

## 2019-09-01 DIAGNOSIS — I10 Essential (primary) hypertension: Secondary | ICD-10-CM | POA: Diagnosis not present

## 2019-09-01 DIAGNOSIS — Z5111 Encounter for antineoplastic chemotherapy: Secondary | ICD-10-CM | POA: Diagnosis not present

## 2019-09-01 DIAGNOSIS — M545 Low back pain: Secondary | ICD-10-CM | POA: Diagnosis not present

## 2019-09-01 MED ORDER — PROCHLORPERAZINE MALEATE 10 MG PO TABS
10.0000 mg | ORAL_TABLET | Freq: Once | ORAL | Status: AC
  Administered 2019-09-01: 10 mg via ORAL

## 2019-09-01 MED ORDER — BORTEZOMIB CHEMO SQ INJECTION 3.5 MG (2.5MG/ML)
1.3000 mg/m2 | Freq: Once | INTRAMUSCULAR | Status: AC
  Administered 2019-09-01: 2 mg via SUBCUTANEOUS
  Filled 2019-09-01: qty 0.8

## 2019-09-01 MED ORDER — PROCHLORPERAZINE MALEATE 10 MG PO TABS
ORAL_TABLET | ORAL | Status: AC
  Filled 2019-09-01: qty 1

## 2019-09-01 NOTE — Patient Instructions (Signed)
[facility] Discharge Instructions for Patients Receiving Chemotherapy  Today you received the following chemotherapy agents Velcade.  To help prevent nausea and vomiting after your treatment, we encourage you to take your nausea medication as directed.  If you develop nausea and vomiting that is not controlled by your nausea medication, call the clinic.   BELOW ARE SYMPTOMS THAT SHOULD BE REPORTED IMMEDIATELY:  *FEVER GREATER THAN 100.5 F  *CHILLS WITH OR WITHOUT FEVER  NAUSEA AND VOMITING THAT IS NOT CONTROLLED WITH YOUR NAUSEA MEDICATION  *UNUSUAL SHORTNESS OF BREATH  *UNUSUAL BRUISING OR BLEEDING  TENDERNESS IN MOUTH AND THROAT WITH OR WITHOUT PRESENCE OF ULCERS  *URINARY PROBLEMS  *BOWEL PROBLEMS  UNUSUAL RASH Items with * indicate a potential emergency and should be followed up as soon as possible.  Feel free to call the clinic should you have any questions or concerns. The clinic phone number is [phone].  Please show the CHEMO ALERT CARD at check-in to the Emergency Department and triage nurse.   

## 2019-09-04 NOTE — Progress Notes (Signed)
HEMATOLOGY/ONCOLOGY CLINIC NOTE  Date of Service: 09/05/2019  Patient Care Team: [name], MD as PCP - General (Family Medicine) [name], MD as Consulting Physician (Hematology) Bond, [name], MD as Referring Physician (Ophthalmology) [name], [name], MD as Consulting Physician (Hematology and Oncology)  Dr. Ronney Lion as [place] Specialist  [phone]  CHIEF COMPLAINTS/PURPOSE OF CONSULTATION:   Multiple Myeloma- continued management  HISTORY OF PRESENTING ILLNESS:   Megan Olson is a wonderful 78 y.o. female who has been referred to [place] by Dr. 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 She is accompanied today by her husband. The pt reports that she is doing well overall.   The pt notes that she was doing extensive yard work in October 2019, and began to feel back pain a few days after this, which she attributed to muscle pain. She recalls taking a deep breath, and developing sudden acute pain, and presented to care with her PCP's office. She began exercises for her back pain, without relief, then began PT without relief, then was referred to Dr. Paulla Fore in sports medicine in late January 2020. She had an XR which revealed a compression fracture at T11, then subsequently had an MRI, and a bone marrow biopsy. The pt notes that her back pain "seemed to move around." She endorses pain "up the whole left side" of her back.  The pt notes that she is not able to stand up straight due to her back pain, which she feels limits her ability to take a deep breath, and endorses pain exacerbation when she does take a deep breath. The pt needs assistance from sitting to lying from her husband. She is using about 3 Percocet a day.  The pt notes worsened constipation since beginning Percocet, and notes that her most recent laxative order was not covered by her insurance. She is using prune juice and milk  of magnesia. She took Senokot S BID without relief.  The pt reports that prior to her recent back pain, she had very few medical concerns. She endorses controlled BP, and has been monitored for DM but after closely watching her diet her A1C decreased to 5.8. She has glaucoma, and has had surgery in both eyes. Right eye with stent. The pt sees Dr. Edilia Bo at [facility] for her [facility] care. The pt notes that her vision has been recently "pretty good." The pt notes that she has been advised to limit treatment with steroids for her glaucoma. She denies heart or lung problems. Denies previous back problems. Last DEXA scan 3 years ago, and endorses osteopenia. She notes that she took Fosamax for 3-4 years, and stopped 5-6 years ago. She takes Vitamin D, a multivitamin, and magnesium.  The pt notes that she quit smoking cigarettes when she was 27, started when she was about 20. The pt consumes alcohol rarely, but not since beginning narcotics. 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  Of note prior to the patient's visit today, pt has had an MRI Thoracic Spine completed on 11/27/18 with results revealing Multifocal marrow signal abnormality consistent with metastatic disease or multiple myeloma. The patient has multiple compression fractures most consistent with pathologic injuries. Extensive marrow signal abnormality makes determining age of the fractures difficult but edema is most intense in T3. 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  Most recent  lab results (12/01/18) of CBC w/diff is as follows: all values are WNL except for RBC at 3.17, HGB at 10.4, HCT at 33.5, MCV at 105.7. 11/28/18 CMP revealed all values WNL except for Glucose at 105, Total Protein at 10.2, Albumin at 3.1 11/30/18 24HR UPEP revealed all values WNL except for M-spike at 75.1%. 11/28/18 Bega-2 microglobulin slightly elevated at 2.6 11/28/18 SPEP revealed  M spike at 4.6g 11/28/18 Immunoglobulins revealed IgG at 6181, IgA at 24, IgM at 16, and IgE at 6.  On review of systems, pt reports constipation, back pain, stable energy levels, ankle swelling, tenderness at T3, lower back pain, and denies abdominal pains, neck pain, and any other symptoms.   On PMHx the pt reports redundant colon, single hemorrhoid, glaucoma, HTN, osteopenia, Tonsillectomy, right eye stent and multiple eye surgeries. On Social Hx the pt reports rare alcohol use, smoked cigarettes between ages 36 and 34. 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 On Family Hx the pt reports sister died from small cell lung cancer and was a lifelong smoker, brother's daughter died of breast cancer with BRCA1 and BRCA2 mutations. Cousin with female breast cancer.   INTERVAL HISTORY:   Megan Olson returns today for management and evaluation of her multiple myeloma and C13D8 of dexamethazone, bortezomib, cytoxan and denosumab at this time. The patient's last visit with [place] was on 08/08/2019. The pt reports that she is doing well overall.  The pt reports that she has been feeling well in the interim. Her balance has improved and she has been working on this at home. She has been walking with her husband quite a bit and is even working in her yard. Pt denies any tingling/numbing in her hands/feet. She is currently taking 3,000 units of Vitamin D daily in addition to her multivitamin. Pt is interested in beginning to use a TENS unit to assist with her muscular pain and will also speak with an Orthopedist to see their recommendations. She continues to minimize her pain medication usage and is now only taking 4 Percocets daily.   Lab results today (09/05/19) of CBC w/diff and CMP is as follows: all values are WNL except for WBC at 2.3K, RBC at 2.93, Hgb at 10.5, HCT at 31.4, MCV at 107.2, MCH at 35.8, Lymphs Abs at 0.1K, Total Protein at 6.1, Alkaline Phosphatase at 33. 09/05/2019 MMP is in progress   On review of  systems, pt reports improving balance and denies numbness/tingling in hands/feet, abdominal pain and any other symptoms.   MEDICAL HISTORY:  Past Medical History:  Diagnosis Date  . Cancer ([place])    multiple myeloma  . Glaucoma   . HYPERTENSION 03/11/2007  . OSTEOPENIA 03/11/2007    SURGICAL HISTORY: Past Surgical History:  Procedure Laterality Date  . BREAST EXCISIONAL BIOPSY Right 2000  . BREAST LUMPECTOMY  1990   benign  . DILATION AND CURETTAGE OF UTERUS     bleeding at menopause. No uterine cancer  . IR RADIOLOGIST EVAL & MGMT  12/13/2018  . TONSILLECTOMY     age 78    SOCIAL HISTORY: Social History   Socioeconomic History  . Marital status: Married    Spouse name: Not on file  . Number of children: Not on file  . Years of education: Not on file  . Highest education level: Not on file  Occupational History  . Not on file  Social Needs  . Financial resource strain: Not on file  . Food insecurity    Worry: Not  on file    Inability: Not on file  . Transportation needs    Medical: Not on file    Non-medical: Not on file  Tobacco Use  . Smoking status: Former Smoker    Packs/day: 0.50    Years: 7.00    Pack years: 3.50    Types: Cigarettes    Quit date: 01/04/1961    Years since quitting: 58.7  . Smokeless tobacco: Never Used  Substance and Sexual Activity  . Alcohol use: Yes    Alcohol/week: 1.0 standard drinks    Types: 1 Standard drinks or equivalent per week  . Drug use: No  . Sexual activity: Not Currently  Lifestyle  . Physical activity    Days per week: Not on file    Minutes per session: Not on file  . Stress: Not on file  Relationships  . Social Herbalist on phone: Not on file    Gets together: Not on file    Attends religious service: Not on file    Active member of club or organization: Not on file    Attends meetings of clubs or organizations: Not on file    Relationship status: Not on file  . Intimate partner violence    Fear of  current or ex partner: Not on file    Emotionally abused: Not on file    Physically abused: Not on file    Forced sexual activity: Not on file  Other Topics Concern  . Not on file  Social History Narrative   Married. Lives with husband (patient of Dr. Yong Channel). 1 son. No grandkids. 1 granddog.       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 Heart disease Mother        CHF mother died 3  . Arthritis Mother   . Glaucoma Mother        sister as well  . Alcohol abuse Father   . Suicidality Father   . Cancer Sister        lung cancer, smoker  . Heart disease Sister        aortic valve replacement  . Hyperlipidemia Brother   . Hypertension Brother   . COPD Brother   . Arthritis Sister   . Hypertension Sister   . Glaucoma Sister   . Hashimoto's thyroiditis Sister   . Hypertension Son   . Stroke Maternal Grandmother   . Colon cancer Neg Hx   . Colon polyps Neg Hx     ALLERGIES:  is allergic to ace inhibitors; benadryl [diphenhydramine]; diamox [acetazolamide]; sulfamethoxazole; lenalidomide; and penicillins.   MEDICATIONS:  Current Outpatient Medications  Medication Sig Dispense Refill  . acyclovir (ZOVIRAX) 400 MG tablet Take 1 tablet by mouth twice daily 60 tablet 0  . ALPHAGAN P 0.1 % SOLN Apply 1 drop topically See admin instructions. Apply 2 drops in right eye 3 times a day    . amLODipine (NORVASC) 5 MG tablet Take 1 tablet (5 mg total) by mouth daily. 90 tablet 3  . aspirin EC 81 MG tablet Take 81 mg by mouth daily.    . bimatoprost (LUMIGAN) 0.03 % ophthalmic solution Place 1 drop into the right eye at bedtime.     . Cholecalciferol (VITAMIN D3) 1000 UNITS CAPS  Take 1,000 Units by mouth 2 (two) times daily.     Marland Kitchen co-enzyme Q-10 50 MG capsule Take 50 mg by mouth daily.      .  cyclophosphamide (CYTOXAN) 50 MG capsule TAKE 9 CAPSULES (450 MG TOTAL) BY MOUTH ONCE A WEEK. TAKE WITH FOOD TO MINIMIZE GI UPSET. TAKE EARLY IN THE DAY AND MAINTAIN HYDRATION. 36 capsule 2  . dexamethasone (DECADRON) 4 MG tablet 19m (5 tabs) on Day 15 of each cycle of treatment 15 tablet 3  . dorzolamide-timolol (COSOPT) 22.3-6.8 MG/ML ophthalmic solution Place 2 drops into both eyes 2 (two) times daily.   11  . famotidine (PEPCID) 10 MG tablet Take 10 mg by mouth 2 (two) times daily.    . fentaNYL (DURAGESIC) 25 MCG/HR Place 1 patch onto the skin every 3 (three) days. 10 patch 0  . hydrOXYzine (ATARAX/VISTARIL) 25 MG tablet Take 1 tablet (25 mg total) by mouth 3 (three) times daily as needed. 30 tablet 0  . magnesium hydroxide (MILK OF MAGNESIA) 400 MG/5ML suspension Take 15 mLs by mouth daily as needed for mild constipation or moderate constipation. 355 mL 0  . Magnesium Oxide 500 MG TABS Take 1 tablet by mouth daily.      . Multiple Vitamins-Minerals (MULTIVITAMIN WITH MINERALS) tablet Take 1 tablet by mouth daily.      . ondansetron (ZOFRAN) 8 MG tablet Take 1 tablet (8 mg total) by mouth 2 (two) times daily as needed (Nausea or vomiting). 30 tablet 1  . oxyCODONE-acetaminophen (PERCOCET) 5-325 MG tablet Take 1-2 tablets by mouth every 4 (four) hours as needed for moderate pain or severe pain. 90 tablet 0  . polyethylene glycol (MIRALAX) packet Take 17 g by mouth daily. 30 each 1  . prochlorperazine (COMPAZINE) 10 MG tablet Take 1 tablet (10 mg total) by mouth every 6 (six) hours as needed (Nausea or vomiting). 30 tablet 1  . senna-docusate (SENOKOT-S) 8.6-50 MG tablet Take 2 tablets by mouth at bedtime. 60 tablet 2  . vitamin C (ASCORBIC ACID) 500 MG tablet Take 500 mg by mouth daily.     No current facility-administered medications for this visit.    Facility-Administered Medications Ordered in Other Visits  Medication Dose Route Frequency Provider Last Rate Last Dose  . bortezomib SQ  (VELCADE) chemo injection 2 mg  1.3 mg/m2 (Treatment Plan Recorded) Subcutaneous Once KBrunetta Genera MD      . dexamethasone (DECADRON) tablet 20 mg  20 mg Oral Once KBrunetta Genera MD      . prochlorperazine (COMPAZINE) tablet 10 mg  10 mg Oral Once KBrunetta Genera MD        REVIEW OF SYSTEMS:   A 10+ POINT REVIEW OF SYSTEMS WAS OBTAINED including neurology, dermatology, psychiatry, cardiac, respiratory, lymph, extremities, GI, GU, Musculoskeletal, constitutional, breasts, reproductive, HEENT.  All pertinent positives are noted in the HPI.  All others are negative.   PHYSICAL EXAMINATION: ECOG PERFORMANCE STATUS: 1-2  Vitals:   09/05/19 0845  BP: (!) 164/98  Pulse: 67  Resp: 18  Temp: 98.5 F (36.9 C)  SpO2: 99%   Filed Weights   09/05/19 0845  Weight: 104 lb 6.4 oz (47.4 kg)   Body mass index is 18.79 kg/m.   Exam was given in a chair   GENERAL:alert, in no acute distress and comfortable SKIN: no acute rashes, no significant lesions EYES: conjunctiva are pink and non-injected, sclera anicteric OROPHARYNX: MMM, no exudates, no oropharyngeal erythema or ulceration NECK: supple,  no JVD LYMPH:  no palpable lymphadenopathy in the cervical, axillary or inguinal regions LUNGS: clear to auscultation b/l with normal respiratory effort HEART: regular rate & rhythm ABDOMEN:  normoactive bowel sounds , non tender, not distended. No palpable hepatosplenomegaly.  Extremity: no pedal edema PSYCH: alert & oriented x 3 with fluent speech NEURO: no focal motor/sensory deficits  LABORATORY DATA:  I have reviewed the data as listed  . CBC Latest Ref Rng & Units 09/05/2019 08/29/2019 08/15/2019  WBC 4.0 - 10.5 K/uL 2.3(L) 3.8(L) 2.4(L)  Hemoglobin 12.0 - 15.0 g/dL 10.5(L) 10.1(L) 9.8(L)  Hematocrit 36.0 - 46.0 % 31.4(L) 30.5(L) 29.6(L)  Platelets 150 - 400 K/uL 206 295 155    . CMP Latest Ref Rng & Units 09/05/2019 08/29/2019 08/15/2019  Glucose 70 - 99 mg/dL 91  96 110(H)  BUN 8 - 23 mg/dL _0 Creatinine 0.44 - 1.00 mg/dL 0.63 0.62 0.63  Sodium 135 - 145 mmol/L 141 142 141  Potassium 3.5 - 5.1 mmol/L 4.1 4.2 4.3  Chloride 98 - 111 mmol/L 103 104 104  CO2 22 - 32 mmol/L _1 Calcium 8.9 - 10.3 mg/dL 9.0 8.8(L) 8.6(L)  Total Protein 6.5 - 8.1 g/dL 6.1(L) 6.0(L) 5.7(L)  Total Bilirubin 0.3 - 1.2 mg/dL 0.3 0.3 0.2(L)  Alkaline Phos 38 - 126 U/L 33(L) 34(L) 31(L)  AST 15 - 41 U/L 15 16 14(L)  ALT 0 - 44 U/L _2 07/14/2019 Cytogenetics ([id number]):    07/14/2019 Flow Pathology Report [id number]):   07/14/2019 BM Bx ([id number]):   Most recent MMP 04/25/2019 Component     Latest Ref Rng & Units 04/25/2019  IgG (Immunoglobin G), Serum     586 - 1,602 mg/dL 702  IgA     64 - 422 mg/dL 39 (L)  IgM (Immunoglobulin M), Srm     26 - 217 mg/dL 26  Total Protein ELP     6.0 - 8.5 g/dL 5.9 (L)  Albumin SerPl Elph-Mcnc     2.9 - 4.4 g/dL 3.8  Alpha 1     0.0 - 0.4 g/dL 0.2  Alpha2 Glob SerPl Elph-Mcnc     0.4 - 1.0 g/dL 0.7  B-Globulin SerPl Elph-Mcnc     0.7 - 1.3 g/dL 1.0  Gamma Glob SerPl Elph-Mcnc     0.4 - 1.8 g/dL 0.3 (L)  M Protein SerPl Elph-Mcnc     Not Observed g/dL 0.4 (H)  Globulin, Total     2.2 - 3.9 g/dL 2.1 (L)  Albumin/Glob SerPl     0.7 - 1.7 1.9 (H)  IFE 1      Comment  Please Note (HCV):      Comment    12/01/18 BM Bx:   12/01/18 Flow Cytometry:       RADIOGRAPHIC STUDIES: I have personally reviewed the radiological images as listed and agreed with the findings in the report. No results found.   ASSESSMENT & PLAN:  78 y.o. female with  1. Multiple Myeloma with IgG Lambda specificity Labs upon initial presentation from 12/01/18 CBC w/diff revealed HGB at 10.4 with MCV of 105.7. 11/28/18 CMP revealed Creatinine normal at 0.68 and Calcium normal at 8.9. 11/28/18 Beta 2 microglobulin at 2.98m (also a reading at 4.733mon the same day). 11/30/18 24HR UPEP revealed M spike at 75.1%.  11/28/18 SPEP revealed M spike of 4.6g. 11/28/18 Immunoglobulins revealed IgG elevated at 618172m 12/01/18 BM Bx revealed hypercellular bone marrow with 70-80%  CD138 immunohistochemistry (44% aspirate) lambda-restricted plasma cells as well as a kappa restricted population of B-cells 11/27/18 MRI Thoracic Spine which revealed Multifocal marrow signal abnormality consistent with metastatic disease or multiple myeloma. The patient has multiple compression fractures most consistent with pathologic injuries. Extensive marrow signal abnormality makes determining age of the fractures difficult but edema is most intense in T3. 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 2. Areas of hypermetabolic disease identified in both lungs suggesting multiple myeloma involvement. 3. 1.9 cm calcified left thyroid nodule is hypermetabolic, but Indeterminate. 4. Colon is diffusely distended with gas and stool. Imaging features would be compatible with clinical constipation. 5.  Aortic Atherosclerois.  Pt describes grade II to III rash on her re-challenge from Revlimid with optimized pre-medications, and we discontinued Revlimid  Began Cytoxan with C4 Velcade and Dexamethasone  05/16/2019 M spike is down to 0.2g/dl showing continued improvement and a 90% reduction  07/17/2019 PET/CT Whole Body Scan ([id number]) revealed "1. 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 2. Hypermetabolic calcified left thyroid nodule, as before, indeterminate. 3. Aortic atherosclerosis (ICD10-170.0). Coronary artery calcification."  -07/14/2019 BM  Bx revealed BONE MARROW: - Hypercellular marrow with residual plasma cell neoplasm (<10%) PERIPHERAL BLOOD: - Pancytopenia".   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 She does not currently have elevated lymphocyte counts on peripheral blood draws.   PLAN: -Discussed pt labwork today, 09/05/19; all values are WNL except for WBC at 2.3K, RBC at 2.93, Hgb at 10.5, HCT at 31.4, MCV at 107.2, MCH at 35.8, Lymphs Abs at 0.1K, Total Protein at 6.1, Alkaline Phosphatase at 33. -Discussed 09/05/2019 MMP is in progress  -Discussed 08/08/2019 M Protein stable at 0.2 g.d/L -Will get additional Vitamin D testing with next labs  -Continue 3000IU Vitamin D daily -Pt may need a referral to an Endocrinologist for thyroid nodule  -The pt has no prohibitive toxicities from continuing C13D8 Dexamethazone, Bortezomib, Cytoxan and Denosumab at this time.   -Plan to move to maintenance treatment after C14 -Will consider Velcade every two weeks or N[place]o weeky on D 1,8,15 for maintenance  -Will switch from Xgeva to Zometa every 2 months for maintenance - provided there is no active disease in the bones according to the PET/CT scan  -Will get a repeat BM Bx in 5-7 days -Will get a repeat PET/CT in 5-7 days -Refill Acyclovir  -Will see back in 2 weeks   FOLLOW UP: CT bone marrow aspiration and biopsy in 5 to 7 days PET/CT scan in 5 to 7 days Follow-up as per schedule appointment for cycle 14 of treatment with day 1 labs and MD visit in 2 weeks  The total time spent in the appt was 25 minutes and more than 50% was on counseling and direct patient cares.  All of the patient's questions were answered with apparent satisfaction. The patient knows to call the clinic with any problems, questions or concerns.   Sullivan Lone MD MS  AAHIVMS [place]  Hematology/Oncology Physician [facility]  (Office):       [phone] (Work cell):  [phone] (Fax):           [phone]  09/05/2019 9:32 AM  I, Yevette Edwards, am acting as a scribe for Dr. Sullivan Lone.   .I have reviewed the above documentation for accuracy and completeness, and I agree with the above. [name] MD

## 2019-09-05 ENCOUNTER — Inpatient Hospital Stay (HOSPITAL_BASED_OUTPATIENT_CLINIC_OR_DEPARTMENT_OTHER): Payer: Medicare HMO | Admitting: Hematology

## 2019-09-05 ENCOUNTER — Inpatient Hospital Stay: Payer: Medicare HMO

## 2019-09-05 ENCOUNTER — Other Ambulatory Visit: Payer: Self-pay

## 2019-09-05 ENCOUNTER — Telehealth: Payer: Self-pay | Admitting: Hematology

## 2019-09-05 ENCOUNTER — Inpatient Hospital Stay: Payer: Medicare HMO | Attending: Hematology

## 2019-09-05 VITALS — BP 164/98 | HR 67 | Temp 98.5°F | Resp 18 | Ht 62.5 in | Wt 104.4 lb

## 2019-09-05 DIAGNOSIS — R600 Localized edema: Secondary | ICD-10-CM | POA: Insufficient documentation

## 2019-09-05 DIAGNOSIS — M549 Dorsalgia, unspecified: Secondary | ICD-10-CM | POA: Diagnosis not present

## 2019-09-05 DIAGNOSIS — I700 Atherosclerosis of aorta: Secondary | ICD-10-CM | POA: Diagnosis not present

## 2019-09-05 DIAGNOSIS — R21 Rash and other nonspecific skin eruption: Secondary | ICD-10-CM | POA: Insufficient documentation

## 2019-09-05 DIAGNOSIS — I10 Essential (primary) hypertension: Secondary | ICD-10-CM | POA: Diagnosis not present

## 2019-09-05 DIAGNOSIS — K5900 Constipation, unspecified: Secondary | ICD-10-CM | POA: Insufficient documentation

## 2019-09-05 DIAGNOSIS — I2510 Atherosclerotic heart disease of native coronary artery without angina pectoris: Secondary | ICD-10-CM | POA: Insufficient documentation

## 2019-09-05 DIAGNOSIS — E041 Nontoxic single thyroid nodule: Secondary | ICD-10-CM | POA: Insufficient documentation

## 2019-09-05 DIAGNOSIS — Z87891 Personal history of nicotine dependence: Secondary | ICD-10-CM | POA: Diagnosis not present

## 2019-09-05 DIAGNOSIS — D61818 Other pancytopenia: Secondary | ICD-10-CM | POA: Diagnosis not present

## 2019-09-05 DIAGNOSIS — Z5111 Encounter for antineoplastic chemotherapy: Secondary | ICD-10-CM

## 2019-09-05 DIAGNOSIS — Z79899 Other long term (current) drug therapy: Secondary | ICD-10-CM | POA: Diagnosis not present

## 2019-09-05 DIAGNOSIS — C9000 Multiple myeloma not having achieved remission: Secondary | ICD-10-CM | POA: Diagnosis not present

## 2019-09-05 DIAGNOSIS — M8580 Other specified disorders of bone density and structure, unspecified site: Secondary | ICD-10-CM | POA: Diagnosis not present

## 2019-09-05 DIAGNOSIS — Z7189 Other specified counseling: Secondary | ICD-10-CM

## 2019-09-05 LAB — CBC WITH DIFFERENTIAL/PLATELET
Abs Immature Granulocytes: 0.01 10*3/uL (ref 0.00–0.07)
Basophils Absolute: 0 10*3/uL (ref 0.0–0.1)
Basophils Relative: 0 %
Eosinophils Absolute: 0.1 10*3/uL (ref 0.0–0.5)
Eosinophils Relative: 3 %
HCT: 31.4 % — ABNORMAL LOW (ref 36.0–46.0)
Hemoglobin: 10.5 g/dL — ABNORMAL LOW (ref 12.0–15.0)
Immature Granulocytes: 0 %
Lymphocytes Relative: 4 %
Lymphs Abs: 0.1 10*3/uL — ABNORMAL LOW (ref 0.7–4.0)
MCH: 35.8 pg — ABNORMAL HIGH (ref 26.0–34.0)
MCHC: 33.4 g/dL (ref 30.0–36.0)
MCV: 107.2 fL — ABNORMAL HIGH (ref 80.0–100.0)
Monocytes Absolute: 0.2 10*3/uL (ref 0.1–1.0)
Monocytes Relative: 7 %
Neutro Abs: 1.9 10*3/uL (ref 1.7–7.7)
Neutrophils Relative %: 86 %
Platelets: 206 10*3/uL (ref 150–400)
RBC: 2.93 MIL/uL — ABNORMAL LOW (ref 3.87–5.11)
RDW: 14.4 % (ref 11.5–15.5)
WBC: 2.3 10*3/uL — ABNORMAL LOW (ref 4.0–10.5)
nRBC: 0 % (ref 0.0–0.2)

## 2019-09-05 LAB — CMP (CANCER CENTER ONLY)
ALT: 18 U/L (ref 0–44)
AST: 15 U/L (ref 15–41)
Albumin: 4 g/dL (ref 3.5–5.0)
Alkaline Phosphatase: 33 U/L — ABNORMAL LOW (ref 38–126)
Anion gap: 9 (ref 5–15)
BUN: 14 mg/dL (ref 8–23)
CO2: 29 mmol/L (ref 22–32)
Calcium: 9 mg/dL (ref 8.9–10.3)
Chloride: 103 mmol/L (ref 98–111)
Creatinine: 0.63 mg/dL (ref 0.44–1.00)
GFR, Est AFR Am: >60 mL/min (ref >60)
GFR, Estimated: >60 mL/min (ref >60)
Glucose, Bld: 91 mg/dL (ref 70–99)
Potassium: 4.1 mmol/L (ref 3.5–5.1)
Sodium: 141 mmol/L (ref 135–145)
Total Bilirubin: 0.3 mg/dL (ref 0.3–1.2)
Total Protein: 6.1 g/dL — ABNORMAL LOW (ref 6.5–8.1)

## 2019-09-05 MED ORDER — DEXAMETHASONE 4 MG PO TABS
20.0000 mg | ORAL_TABLET | Freq: Once | ORAL | Status: AC
  Administered 2019-09-05: 20 mg via ORAL

## 2019-09-05 MED ORDER — ACYCLOVIR 400 MG PO TABS: 400.0000 mg | ORAL_TABLET | Freq: Two times a day (BID) | ORAL | 5 refills | Status: DC

## 2019-09-05 MED ORDER — PROCHLORPERAZINE MALEATE 10 MG PO TABS
ORAL_TABLET | ORAL | Status: AC
  Filled 2019-09-05: qty 1

## 2019-09-05 MED ORDER — DEXAMETHASONE 4 MG PO TABS
ORAL_TABLET | ORAL | Status: AC
  Filled 2019-09-05: qty 5

## 2019-09-05 MED ORDER — PROCHLORPERAZINE MALEATE 10 MG PO TABS
10.0000 mg | ORAL_TABLET | Freq: Once | ORAL | Status: AC
  Administered 2019-09-05: 10 mg via ORAL

## 2019-09-05 MED ORDER — BORTEZOMIB CHEMO SQ INJECTION 3.5 MG (2.5MG/ML)
1.3000 mg/m2 | Freq: Once | INTRAMUSCULAR | Status: AC
  Administered 2019-09-05: 2 mg via SUBCUTANEOUS
  Filled 2019-09-05: qty 0.8

## 2019-09-05 NOTE — Telephone Encounter (Signed)
Per 12/1 los Follow-up as per schedule appointment for cycle 14 of treatment with day 1 labs and MD visit in 2 weeks

## 2019-09-05 NOTE — Patient Instructions (Signed)
[facility] Discharge Instructions for Patients Receiving Chemotherapy  Today you received the following chemotherapy agents Velcade.  To help prevent nausea and vomiting after your treatment, we encourage you to take your nausea medication as directed.  If you develop nausea and vomiting that is not controlled by your nausea medication, call the clinic.   BELOW ARE SYMPTOMS THAT SHOULD BE REPORTED IMMEDIATELY:  *FEVER GREATER THAN 100.5 F  *CHILLS WITH OR WITHOUT FEVER  NAUSEA AND VOMITING THAT IS NOT CONTROLLED WITH YOUR NAUSEA MEDICATION  *UNUSUAL SHORTNESS OF BREATH  *UNUSUAL BRUISING OR BLEEDING  TENDERNESS IN MOUTH AND THROAT WITH OR WITHOUT PRESENCE OF ULCERS  *URINARY PROBLEMS  *BOWEL PROBLEMS  UNUSUAL RASH Items with * indicate a potential emergency and should be followed up as soon as possible.  Feel free to call the clinic should you have any questions or concerns. The clinic phone number is [phone].  Please show the CHEMO ALERT CARD at check-in to the Emergency Department and triage nurse.   

## 2019-09-07 LAB — MULTIPLE MYELOMA PANEL, SERUM
Albumin SerPl Elph-Mcnc: 3.8 g/dL (ref 2.9–4.4)
Albumin/Glob SerPl: 2.1 — ABNORMAL HIGH (ref 0.7–1.7)
Alpha 1: 0.2 g/dL (ref 0.0–0.4)
Alpha2 Glob SerPl Elph-Mcnc: 0.6 g/dL (ref 0.4–1.0)
B-Globulin SerPl Elph-Mcnc: 0.8 g/dL (ref 0.7–1.3)
Gamma Glob SerPl Elph-Mcnc: 0.2 g/dL — ABNORMAL LOW (ref 0.4–1.8)
Globulin, Total: 1.9 g/dL — ABNORMAL LOW (ref 2.2–3.9)
IgA: 28 mg/dL — ABNORMAL LOW (ref 64–422)
IgG (Immunoglobin G), Serum: 367 mg/dL — ABNORMAL LOW (ref 586–1602)
IgM (Immunoglobulin M), Srm: 11 mg/dL — ABNORMAL LOW (ref 26–217)
M Protein SerPl Elph-Mcnc: 0.2 g/dL — ABNORMAL HIGH
Total Protein ELP: 5.7 g/dL — ABNORMAL LOW (ref 6.0–8.5)

## 2019-09-08 ENCOUNTER — Inpatient Hospital Stay: Payer: Medicare HMO

## 2019-09-08 ENCOUNTER — Other Ambulatory Visit: Payer: Self-pay | Admitting: Radiology

## 2019-09-08 ENCOUNTER — Other Ambulatory Visit: Payer: Self-pay

## 2019-09-08 VITALS — BP 160/93 | HR 72 | Temp 98.9°F | Resp 16 | Wt 104.5 lb

## 2019-09-08 DIAGNOSIS — M8580 Other specified disorders of bone density and structure, unspecified site: Secondary | ICD-10-CM | POA: Diagnosis not present

## 2019-09-08 DIAGNOSIS — D61818 Other pancytopenia: Secondary | ICD-10-CM | POA: Diagnosis not present

## 2019-09-08 DIAGNOSIS — C9000 Multiple myeloma not having achieved remission: Secondary | ICD-10-CM | POA: Diagnosis not present

## 2019-09-08 DIAGNOSIS — R21 Rash and other nonspecific skin eruption: Secondary | ICD-10-CM | POA: Diagnosis not present

## 2019-09-08 DIAGNOSIS — I10 Essential (primary) hypertension: Secondary | ICD-10-CM | POA: Diagnosis not present

## 2019-09-08 DIAGNOSIS — Z5111 Encounter for antineoplastic chemotherapy: Secondary | ICD-10-CM | POA: Diagnosis not present

## 2019-09-08 DIAGNOSIS — I2510 Atherosclerotic heart disease of native coronary artery without angina pectoris: Secondary | ICD-10-CM | POA: Diagnosis not present

## 2019-09-08 DIAGNOSIS — K5900 Constipation, unspecified: Secondary | ICD-10-CM | POA: Diagnosis not present

## 2019-09-08 DIAGNOSIS — M549 Dorsalgia, unspecified: Secondary | ICD-10-CM | POA: Diagnosis not present

## 2019-09-08 DIAGNOSIS — Z7189 Other specified counseling: Secondary | ICD-10-CM

## 2019-09-08 DIAGNOSIS — R600 Localized edema: Secondary | ICD-10-CM | POA: Diagnosis not present

## 2019-09-08 MED ORDER — PROCHLORPERAZINE MALEATE 10 MG PO TABS
ORAL_TABLET | ORAL | Status: AC
  Filled 2019-09-08: qty 1

## 2019-09-08 MED ORDER — BORTEZOMIB CHEMO SQ INJECTION 3.5 MG (2.5MG/ML)
1.3000 mg/m2 | Freq: Once | INTRAMUSCULAR | Status: AC
  Administered 2019-09-08: 2 mg via SUBCUTANEOUS
  Filled 2019-09-08: qty 0.8

## 2019-09-08 MED ORDER — PROCHLORPERAZINE MALEATE 10 MG PO TABS
10.0000 mg | ORAL_TABLET | Freq: Once | ORAL | Status: AC
  Administered 2019-09-08: 10 mg via ORAL

## 2019-09-08 NOTE — Patient Instructions (Signed)
[facility] Discharge Instructions for Patients Receiving Chemotherapy  Today you received the following chemotherapy agents Velcade  To help prevent nausea and vomiting after your treatment, we encourage you to take your nausea medication as directed by your MD.   If you develop nausea and vomiting that is not controlled by your nausea medication, call the clinic.   BELOW ARE SYMPTOMS THAT SHOULD BE REPORTED IMMEDIATELY:  *FEVER GREATER THAN 100.5 F  *CHILLS WITH OR WITHOUT FEVER  NAUSEA AND VOMITING THAT IS NOT CONTROLLED WITH YOUR NAUSEA MEDICATION  *UNUSUAL SHORTNESS OF BREATH  *UNUSUAL BRUISING OR BLEEDING  TENDERNESS IN MOUTH AND THROAT WITH OR WITHOUT PRESENCE OF ULCERS  *URINARY PROBLEMS  *BOWEL PROBLEMS  UNUSUAL RASH Items with * indicate a potential emergency and should be followed up as soon as possible.  Feel free to call the clinic should you have any questions or concerns. The clinic phone number is [phone].  Please show the CHEMO ALERT CARD at check-in to the Emergency Department and triage nurse.  Coronavirus (COVID-19) Are you at risk?  Are you at risk for the Coronavirus (COVID-19)?  To be considered HIGH RISK for Coronavirus (COVID-19), you have to meet the following criteria:  . Traveled to [place], [place], [place], [place] or [place]; or in the [place] to [place], [place], [place], or [place]; and have fever, cough, and shortness of breath within the last 2 weeks of travel OR . Been in close contact with a person diagnosed with COVID-19 within the last 2 weeks and have fever, cough, and shortness of breath . IF YOU DO NOT MEET THESE CRITERIA, YOU ARE CONSIDERED LOW RISK FOR COVID-19.  What to do if you are HIGH RISK for COVID-19?  . If you are having a medical emergency, call 911. . Seek medical care right away. Before you go to a doctor's office, urgent care or emergency department, call ahead and tell them about  your recent travel, contact with someone diagnosed with COVID-19, and your symptoms. You should receive instructions from your physician's office regarding next steps of care.  . When you arrive at healthcare provider, tell the healthcare staff immediately you have returned from visiting [place], [place], [place], [place] or [place]; or traveled in the [place] to [place], [place], [place], or [place]; in the last two weeks or you have been in close contact with a person diagnosed with COVID-19 in the last 2 weeks.   . Tell the health care staff about your symptoms: fever, cough and shortness of breath. . After you have been seen by a medical provider, you will be either: o Tested for (COVID-19) and discharged home on quarantine except to seek medical care if symptoms worsen, and asked to  - Stay home and avoid contact with others until you get your results (4-5 days)  - Avoid travel on public transportation if possible (such as bus, train, or airplane) or o Sent to the Emergency Department by EMS for evaluation, COVID-19 testing, and possible admission depending on your condition and test results.  What to do if you are LOW RISK for COVID-19?  Reduce your risk of any infection by using the same precautions used for avoiding the common cold or flu:  . Wash your hands often with soap and warm water for at least 20 seconds.  If soap and water are not readily available, use an alcohol-based hand sanitizer with at least 60% alcohol.  . If   coughing or sneezing, cover your mouth and nose by coughing or sneezing into the elbow areas of your shirt or coat, into a tissue or into your sleeve (not your hands). . Avoid shaking hands with others and consider head nods or verbal greetings only. . Avoid touching your eyes, nose, or mouth with unwashed hands.  . Avoid close contact with people who are sick. . Avoid places or events with large numbers of people in one location, like concerts or sporting  events. . Carefully consider travel plans you have or are making. . If you are planning any travel outside or inside the [place], visit the CDC's Travelers' Health webpage for the latest health notices. . If you have some symptoms but not all symptoms, continue to monitor at home and seek medical attention if your symptoms worsen. . If you are having a medical emergency, call 911.   ADDITIONAL HEALTHCARE OPTIONS FOR PATIENTS  [facility] / e-Visit: [email or web address]         [facility]: [phone]  [facility]: [phone]                   [facility]: [phone]    

## 2019-09-11 ENCOUNTER — Ambulatory Visit (HOSPITAL_COMMUNITY)
Admission: RE | Admit: 2019-09-11 | Discharge: 2019-09-11 | Disposition: A | Discharge status: Home / Self Care | Payer: Medicare HMO | Source: Ambulatory Visit | Attending: Hematology | Admitting: Hematology

## 2019-09-11 ENCOUNTER — Other Ambulatory Visit: Payer: Self-pay

## 2019-09-11 ENCOUNTER — Encounter (HOSPITAL_COMMUNITY): Payer: Self-pay

## 2019-09-11 DIAGNOSIS — Z79899 Other long term (current) drug therapy: Secondary | ICD-10-CM | POA: Insufficient documentation

## 2019-09-11 DIAGNOSIS — Z83511 Family history of glaucoma: Secondary | ICD-10-CM | POA: Insufficient documentation

## 2019-09-11 DIAGNOSIS — Z801 Family history of malignant neoplasm of trachea, bronchus and lung: Secondary | ICD-10-CM | POA: Diagnosis not present

## 2019-09-11 DIAGNOSIS — Z882 Allergy status to sulfonamides status: Secondary | ICD-10-CM | POA: Insufficient documentation

## 2019-09-11 DIAGNOSIS — Z5111 Encounter for antineoplastic chemotherapy: Secondary | ICD-10-CM | POA: Diagnosis not present

## 2019-09-11 DIAGNOSIS — H409 Unspecified glaucoma: Secondary | ICD-10-CM | POA: Diagnosis not present

## 2019-09-11 DIAGNOSIS — Z7982 Long term (current) use of aspirin: Secondary | ICD-10-CM | POA: Diagnosis not present

## 2019-09-11 DIAGNOSIS — I10 Essential (primary) hypertension: Secondary | ICD-10-CM | POA: Insufficient documentation

## 2019-09-11 DIAGNOSIS — Z823 Family history of stroke: Secondary | ICD-10-CM | POA: Insufficient documentation

## 2019-09-11 DIAGNOSIS — Z87891 Personal history of nicotine dependence: Secondary | ICD-10-CM | POA: Diagnosis not present

## 2019-09-11 DIAGNOSIS — Z888 Allergy status to other drugs, medicaments and biological substances status: Secondary | ICD-10-CM | POA: Insufficient documentation

## 2019-09-11 DIAGNOSIS — Z880 Allergy status to penicillin: Secondary | ICD-10-CM | POA: Diagnosis not present

## 2019-09-11 DIAGNOSIS — M8580 Other specified disorders of bone density and structure, unspecified site: Secondary | ICD-10-CM | POA: Diagnosis not present

## 2019-09-11 DIAGNOSIS — Z9011 Acquired absence of right breast and nipple: Secondary | ICD-10-CM | POA: Diagnosis not present

## 2019-09-11 DIAGNOSIS — C9000 Multiple myeloma not having achieved remission: Secondary | ICD-10-CM | POA: Insufficient documentation

## 2019-09-11 DIAGNOSIS — Z8249 Family history of ischemic heart disease and other diseases of the circulatory system: Secondary | ICD-10-CM | POA: Diagnosis not present

## 2019-09-11 DIAGNOSIS — D61818 Other pancytopenia: Secondary | ICD-10-CM | POA: Diagnosis not present

## 2019-09-11 DIAGNOSIS — Z8261 Family history of arthritis: Secondary | ICD-10-CM | POA: Insufficient documentation

## 2019-09-11 LAB — CBC WITH DIFFERENTIAL/PLATELET
Abs Immature Granulocytes: 0.02 10*3/uL (ref 0.00–0.07)
Basophils Absolute: 0 10*3/uL (ref 0.0–0.1)
Basophils Relative: 1 %
Eosinophils Absolute: 0 10*3/uL (ref 0.0–0.5)
Eosinophils Relative: 1 %
HCT: 35 % — ABNORMAL LOW (ref 36.0–46.0)
Hemoglobin: 11.2 g/dL — ABNORMAL LOW (ref 12.0–15.0)
Immature Granulocytes: 1 %
Lymphocytes Relative: 4 %
Lymphs Abs: 0.1 10*3/uL — ABNORMAL LOW (ref 0.7–4.0)
MCH: 35 pg — ABNORMAL HIGH (ref 26.0–34.0)
MCHC: 32 g/dL (ref 30.0–36.0)
MCV: 109.4 fL — ABNORMAL HIGH (ref 80.0–100.0)
Monocytes Absolute: 0.4 10*3/uL (ref 0.1–1.0)
Monocytes Relative: 14 %
Neutro Abs: 2.3 10*3/uL (ref 1.7–7.7)
Neutrophils Relative %: 79 %
Platelets: 148 10*3/uL — ABNORMAL LOW (ref 150–400)
RBC: 3.2 MIL/uL — ABNORMAL LOW (ref 3.87–5.11)
RDW: 14.5 % (ref 11.5–15.5)
WBC: 3 10*3/uL — ABNORMAL LOW (ref 4.0–10.5)
nRBC: 0 % (ref 0.0–0.2)

## 2019-09-11 LAB — PROTIME-INR
INR: 0.9 (ref 0.8–1.2)
Prothrombin Time: 11.5 seconds (ref 11.4–15.2)

## 2019-09-11 MED ORDER — FLUMAZENIL 0.5 MG/5ML IV SOLN
INTRAVENOUS | Status: AC
  Filled 2019-09-11: qty 5

## 2019-09-11 MED ORDER — SODIUM CHLORIDE 0.9 % IV SOLN
INTRAVENOUS | Status: DC
  Administered 2019-09-11: 08:00:00 via INTRAVENOUS

## 2019-09-11 MED ORDER — MIDAZOLAM HCL 2 MG/2ML IJ SOLN
INTRAMUSCULAR | Status: AC
  Filled 2019-09-11: qty 2

## 2019-09-11 MED ORDER — MIDAZOLAM HCL 2 MG/2ML IJ SOLN
INTRAMUSCULAR | Status: AC | PRN
  Administered 2019-09-11 (×2): 1 mg via INTRAVENOUS

## 2019-09-11 MED ORDER — NALOXONE HCL 0.4 MG/ML IJ SOLN
INTRAMUSCULAR | Status: AC
  Filled 2019-09-11: qty 1

## 2019-09-11 MED ORDER — FENTANYL CITRATE (PF) 100 MCG/2ML IJ SOLN
INTRAMUSCULAR | Status: AC | PRN
  Administered 2019-09-11 (×2): 50 ug via INTRAVENOUS

## 2019-09-11 MED ORDER — LIDOCAINE-EPINEPHRINE 1 %-1:100000 IJ SOLN
INTRAMUSCULAR | Status: AC | PRN
  Administered 2019-09-11: 10 mL

## 2019-09-11 MED ORDER — FENTANYL CITRATE (PF) 100 MCG/2ML IJ SOLN
INTRAMUSCULAR | Status: AC
  Filled 2019-09-11: qty 2

## 2019-09-11 NOTE — Discharge Instructions (Addendum)
For any questions or concerns call [phone]; for after hours call, [phone] and ask for on call MD    Bone Marrow Aspiration and Bone Marrow Biopsy, Adult, Care After This sheet gives you information about how to care for yourself after your procedure. Your health care provider may also give you more specific instructions. If you have problems or questions, contact your health care provider. What can I expect after the procedure? After the procedure, it is common to have:  Mild pain and tenderness.  Swelling.  Bruising. Follow these instructions at home: Puncture site care      Follow instructions from your health care provider about how to take care of the puncture site. Make sure you: ? Wash your hands with soap and water before you change your bandage (dressing). If soap and water are not available, use hand sanitizer. ? Change your dressing as told by your health care provider.  Check your puncture siteevery day for signs of infection. Check for: ? More redness, swelling, or pain. ? More fluid or blood. ? Warmth. ? Pus or a bad smell. General instructions  Take over-the-counter and prescription medicines only as told by your health care provider.  Do not take baths, swim, or use a hot tub until your health care provider approves. Ask if you can take a shower or have a sponge bath.  Return to your normal activities as told by your health care provider. Ask your health care provider what activities are safe for you.  Do not drive for 24 hours if you were given a medicine to help you relax (sedative) during your procedure.  Keep all follow-up visits as told by your health care provider. This is important. Contact a health care provider if:  Your pain is not controlled with medicine. Get help right away if:  You have a fever.  You have more redness, swelling, or pain around the puncture site.  You have more fluid or blood coming from the puncture site.  Your  puncture site feels warm to the touch.  You have pus or a bad smell coming from the puncture site. These symptoms may represent a serious problem that is an emergency. Do not wait to see if the symptoms will go away. Get medical help right away. Call your local emergency services (911 in the [place] Do not drive yourself to the hospital. Summary  After the procedure, it is common to have mild pain, tenderness, swelling, and bruising.  Follow instructions from your health care provider about how to take care of the puncture site.  Get help right away if you have any symptoms of infection or if you have more blood or fluid coming from the puncture site. This information is not intended to replace advice given to you by your health care provider. Make sure you discuss any questions you have with your health care provider. Document Released: 04/10/2005 Document Revised: 01/04/2018 Document Reviewed: 03/04/2016 Elsevier Patient Education  [place]. Moderate Conscious Sedation, Adult, Care After These instructions provide you with information about caring for yourself after your procedure. Your health care provider may also give you more specific instructions. Your treatment has been planned according to current medical practices, but problems sometimes occur. Call your health care provider if you have any problems or questions after your procedure. What can I expect after the procedure? After your procedure, it is common:  To feel sleepy for several hours.  To feel clumsy and have poor balance for several  hours.  To have poor judgment for several hours.  To vomit if you eat too soon. Follow these instructions at home: For at least 24 hours after the procedure:   Do not: ? Participate in activities where you could fall or become injured. ? Drive. ? Use heavy machinery. ? Drink alcohol. ? Take sleeping pills or medicines that cause drowsiness. ? Make important decisions or sign  legal documents. ? Take care of children on your own.  Rest. Eating and drinking  Follow the diet recommended by your health care provider.  If you vomit: ? Drink water, juice, or soup when you can drink without vomiting. ? Make sure you have little or no nausea before eating solid foods. General instructions  Have a responsible adult stay with you until you are awake and alert.  Take over-the-counter and prescription medicines only as told by your health care provider.  If you smoke, do not smoke without supervision.  Keep all follow-up visits as told by your health care provider. This is important. Contact a health care provider if:  You keep feeling nauseous or you keep vomiting.  You feel light-headed.  You develop a rash.  You have a fever. Get help right away if:  You have trouble breathing. This information is not intended to replace advice given to you by your health care provider. Make sure you discuss any questions you have with your health care provider. Document Released: 07/12/2013 Document Revised: 09/03/2017 Document Reviewed: 01/11/2016 Elsevier Patient Education  2020 [employer].

## 2019-09-11 NOTE — Procedures (Signed)
Pre-procedure Diagnosis: Multiple myeloma Post-procedure Diagnosis: Same  Technically successful CT guided bone marrow aspiration and biopsy of left iliac crest.   Complications: None Immediate  EBL: None  Signed: John Watts Pager: [phone] 09/11/2019, 9:03 AM    

## 2019-09-11 NOTE — H&P (Signed)
Chief Complaint: Multiple Myeloma  Referring Physician(s): [name]  Supervising Physician: Sandi Mariscal  Patient Status: [facility]  History of Present Illness: Megan Olson is a 78 y.o. female to IR service from bone marrow biopsy on 12/01/2018 and 07/14/2019 as well as kyphoplasty consultation on 12/13/2018.    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   She tells me the bone marrow biopsy is needed for a baseline for maintenance chemotherapy.  She is NPO. No nausea/vomiting. No Fever/chills. ROS negative.   Past Medical History:  Diagnosis Date  . Cancer ([place])    multiple myeloma  . Glaucoma   . HYPERTENSION 03/11/2007  . OSTEOPENIA 03/11/2007    Past Surgical History:  Procedure Laterality Date  . BREAST EXCISIONAL BIOPSY Right 2000  . BREAST LUMPECTOMY  1990   benign  . DILATION AND CURETTAGE OF UTERUS     bleeding at menopause. No uterine cancer  . IR RADIOLOGIST EVAL & MGMT  12/13/2018  . TONSILLECTOMY     age 79    Allergies: Ace inhibitors, Benadryl [diphenhydramine], Diamox [acetazolamide], Sulfamethoxazole, Lenalidomide, and Penicillins  Medications: Prior to Admission medications   Medication Sig Start Date End Date Taking? Authorizing Provider  acyclovir (ZOVIRAX) 400 MG tablet Take 1 tablet (400 mg total) by mouth 2 (two) times daily. 09/05/19  Yes [name], MD  ALPHAGAN P 0.1 % SOLN Apply 1 drop topically See admin instructions. Apply 2 drops in right eye 3 times a day 02/02/17  Yes [provider]  amLODipine (NORVASC) 5 MG tablet Take 1 tablet (5 mg total) by mouth daily. 04/11/19  Yes [name], MD  aspirin EC 81 MG tablet Take 81 mg by mouth daily.   Yes [provider]  bimatoprost (LUMIGAN) 0.03 % ophthalmic solution Place 1 drop into the right eye at bedtime.    Yes [provider]   Cholecalciferol (VITAMIN D3) 1000 UNITS CAPS Take 1,000 Units by mouth 2 (two) times daily.    Yes [provider]  co-enzyme Q-10 50 MG capsule Take 50 mg by mouth daily.     Yes [provider]  cyclophosphamide (CYTOXAN) 50 MG capsule TAKE 9 CAPSULES (450 MG TOTAL) BY MOUTH ONCE A WEEK. TAKE WITH FOOD TO MINIMIZE GI UPSET. TAKE EARLY IN THE DAY AND MAINTAIN HYDRATION. 08/15/19  Yes [name], MD  dexamethasone (DECADRON) 4 MG tablet 51m (5 tabs) on Day 15 of each cycle of treatment 01/31/19  Yes KBrunetta Genera MD  dorzolamide-timolol (COSOPT) 22.3-6.8 MG/ML ophthalmic solution Place 2 drops into both eyes 2 (two) times daily.  03/01/17  Yes [provider]  famotidine (PEPCID) 10 MG tablet Take 10 mg by mouth 2 (two) times daily.   Yes [provider]  fentaNYL (DURAGESIC) 25 MCG/HR Place 1 patch onto the skin every 3 (three) days. 08/15/19  Yes KBrunetta Genera MD  hydrOXYzine (ATARAX/VISTARIL) 25 MG tablet Take 1 tablet (25 mg total) by mouth 3 (three) times daily as needed. 01/10/19  Yes KBrunetta Genera MD  magnesium hydroxide (MILK OF MAGNESIA) 400 MG/5ML suspension Take 15 mLs by mouth daily as needed for mild constipation or moderate constipation. 12/19/18  Yes Long, JWonda Olds MD  Magnesium Oxide 500 MG TABS Take 1 tablet by mouth daily.     Yes [provider]  Multiple Vitamins-Minerals (MULTIVITAMIN WITH  MINERALS) tablet Take 1 tablet by mouth daily.     Yes [provider]  oxyCODONE-acetaminophen (PERCOCET) 5-325 MG tablet Take 1-2 tablets by mouth every 4 (four) hours as needed for moderate pain or severe pain. 08/24/19  Yes [name], MD  polyethylene glycol [facility]) packet Take 17 g by mouth daily. 12/12/18  Yes [name], MD  senna-docusate (SENOKOT-S) 8.6-50 MG tablet Take 2 tablets by mouth at bedtime. 12/12/18  Yes [name], MD  vitamin C (ASCORBIC ACID) 500 MG tablet Take  500 mg by mouth daily. 02/02/19  Yes [provider]  ondansetron (ZOFRAN) 8 MG tablet Take 1 tablet (8 mg total) by mouth 2 (two) times daily as needed (Nausea or vomiting). 12/12/18   [name], MD  prochlorperazine (COMPAZINE) 10 MG tablet Take 1 tablet (10 mg total) by mouth every 6 (six) hours as needed (Nausea or vomiting). 12/12/18   [name], MD     Family History  Problem Relation Age of Onset  . Heart disease Mother        CHF mother died 3  . Arthritis Mother   . Glaucoma Mother        sister as well  . Alcohol abuse Father   . Suicidality Father   . Cancer Sister        lung cancer, smoker  . Heart disease Sister        aortic valve replacement  . Hyperlipidemia Brother   . Hypertension Brother   . COPD Brother   . Arthritis Sister   . Hypertension Sister   . Glaucoma Sister   . Hashimoto's thyroiditis Sister   . Hypertension Son   . Stroke Maternal Grandmother   . Colon cancer Neg Hx   . Colon polyps Neg Hx     Social History   Socioeconomic History  . Marital status: Married    Spouse name: Not on file  . Number of children: Not on file  . Years of education: Not on file  . Highest education level: Not on file  Occupational History  . Not on file  Social Needs  . Financial resource strain: Not on file  . Food insecurity    Worry: Not on file    Inability: Not on file  . Transportation needs    Medical: Not on file    Non-medical: Not on file  Tobacco Use  . Smoking status: Former Smoker    Packs/day: 0.50    Years: 7.00    Pack years: 3.50    Types: Cigarettes    Quit date: 01/04/1961    Years since quitting: 58.7  . Smokeless tobacco: Never Used  Substance and Sexual Activity  . Alcohol use: Yes    Alcohol/week: 1.0 standard drinks    Types: 1 Standard drinks or equivalent per week  . Drug use: No  . Sexual activity: Not Currently  Lifestyle  . Physical activity    Days per week: Not on file    Minutes per  session: Not on file  . Stress: Not on file  Relationships  . Social Herbalist on phone: Not on file    Gets together: Not on file    Attends religious service: Not on file    Active member of club or organization: Not on file    Attends meetings of clubs or organizations: Not on file    Relationship status: Not on file  Other Topics  Concern  . Not on file  Social History Narrative   Married. Lives with husband (patient of Dr. Yong Channel). 1 son. No grandkids. 1 granddog.       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  All other systems are negative.  Review of Systems  Vital Signs: BP (!) 188/99 (BP Location: Right Arm)   Pulse 70   Temp 98.1 F (36.7 C) (Oral)   Resp 18   SpO2 97%   Physical Exam Vitals signs reviewed.  Constitutional:      Appearance: Normal appearance.  HENT:     Head: Normocephalic and atraumatic.  Eyes:     Extraocular Movements: Extraocular movements intact.  Neck:     Musculoskeletal: Normal range of motion.  Cardiovascular:     Rate and Rhythm: Normal rate and regular rhythm.  Pulmonary:     Effort: Pulmonary effort is normal.     Breath sounds: Normal breath sounds.  Abdominal:     Palpations: Abdomen is soft.  Musculoskeletal: Normal range of motion.  Skin:    General: Skin is warm and dry.  Neurological:     General: No focal deficit present.     Mental Status: She is alert and oriented to person, place, and time.  Psychiatric:        Mood and Affect: Mood normal.        Behavior: Behavior normal.        Thought Content: Thought content normal.        Judgment: Judgment normal.     Imaging: No results found.  Labs:  CBC: Recent Labs    08/08/19 1323 08/15/19 0808 08/29/19 0739 09/05/19 0824   WBC 5.9 2.4* 3.8* 2.3*  HGB 10.3* 9.8* 10.1* 10.5*  HCT 30.2* 29.6* 30.5* 31.4*  PLT 283 155 295 206    COAGS: Recent Labs    12/01/18 0757 07/14/19 0733  INR 1.1 0.9    BMP: Recent Labs    08/08/19 1323 08/15/19 0808 08/29/19 0739 09/05/19 0824  NA 134* 141 142 141  K 4.0 4.3 4.2 4.1  CL 99 104 104 103  CO2 _0 GLUCOSE 104* 110* 96 91  BUN _1 CALCIUM 8.7* 8.6* 8.8* 9.0  CREATININE 0.63 0.63 0.62 0.63  GFRNONAA >60 >60 >60 >60  GFRAA >60 >60 >60 >60    LIVER FUNCTION TESTS: Recent Labs    08/08/19 1323 08/15/19 0808 08/29/19 0739 09/05/19 0824  BILITOT 0.3 0.2* 0.3 0.3  AST 14* 14* 16 15  ALT _2 ALKPHOS 33* 31* 34* 33*  PROT 6.1* 5.7* 6.0* 6.1*  ALBUMIN 4.0 3.7 3.8 4.0    TUMOR MARKERS: No results for input(s): AFPTM, CEA, CA199, CHROMGRNA in the last 8760 hours.  Assessment and Plan:  Multiple Myeloma. BM biopsy needed for baseline maintenance chemotherapy.  Will proceed today by Dr. Pascal Lux.  Risks and benefits of bone marrow bipsy was discussed with the patient and/or patient's family including, but not limited to bleeding, infection, damage to adjacent structures or low yield requiring additional tests.  All of the questions were answered and there is agreement to proceed.  Consent signed and in chart.  Thank you for this  interesting consult.  I greatly enjoyed meeting Megan Olson and look forward to participating in their care.  A copy of this report was sent to the requesting provider on this date.  Electronically Signed: Murrell Redden, PA-C   09/11/2019, 8:04 AM      I spent a total of  25 Minutes in face to face in clinical consultation, greater than 50% of which was counseling/coordinating care for bone marrow biopsy.

## 2019-09-12 ENCOUNTER — Telehealth: Payer: Self-pay | Admitting: *Deleted

## 2019-09-12 ENCOUNTER — Other Ambulatory Visit: Payer: Self-pay

## 2019-09-12 NOTE — Telephone Encounter (Signed)
Patient called with several concerns; -mouth sores: asked about proportions for salt/baking soda mouthwash. States sores have improved with just salt rinse, but wanted to try using the other. Advised if sores do not improve or get worse, notify office for Rx for magic mouthwash. -red area/pressure sore beginning on heel: states she is wearing open shoes, moisturizing and keeping heel off mattress or chair when in recliner. Advised if redness does not improve or gets worse to notify office. -requested refill of oxycodone be entered. Will be out over weekend. Dr. Irene Limbo notified of refill request

## 2019-09-13 DIAGNOSIS — R69 Illness, unspecified: Secondary | ICD-10-CM | POA: Diagnosis not present

## 2019-09-13 LAB — SURGICAL PATHOLOGY

## 2019-09-13 MED FILL — CYCLOPHOSPHAMIDE 50 MG CAPS: 50 | 28 days supply | Qty: 36 | Fill #1

## 2019-09-14 ENCOUNTER — Other Ambulatory Visit: Payer: Self-pay

## 2019-09-14 ENCOUNTER — Other Ambulatory Visit: Payer: Self-pay | Admitting: Hematology

## 2019-09-14 ENCOUNTER — Encounter (HOSPITAL_COMMUNITY)
Admission: RE | Admit: 2019-09-14 | Discharge: 2019-09-14 | Disposition: A | Discharge status: Home / Self Care | Payer: Medicare HMO | Source: Ambulatory Visit | Attending: Hematology | Admitting: Hematology

## 2019-09-14 DIAGNOSIS — I700 Atherosclerosis of aorta: Secondary | ICD-10-CM | POA: Insufficient documentation

## 2019-09-14 DIAGNOSIS — M899 Disorder of bone, unspecified: Secondary | ICD-10-CM | POA: Diagnosis not present

## 2019-09-14 DIAGNOSIS — E041 Nontoxic single thyroid nodule: Secondary | ICD-10-CM | POA: Diagnosis not present

## 2019-09-14 DIAGNOSIS — Z9221 Personal history of antineoplastic chemotherapy: Secondary | ICD-10-CM | POA: Insufficient documentation

## 2019-09-14 DIAGNOSIS — I2510 Atherosclerotic heart disease of native coronary artery without angina pectoris: Secondary | ICD-10-CM | POA: Insufficient documentation

## 2019-09-14 DIAGNOSIS — C9000 Multiple myeloma not having achieved remission: Secondary | ICD-10-CM | POA: Diagnosis present

## 2019-09-14 DIAGNOSIS — Z5111 Encounter for antineoplastic chemotherapy: Secondary | ICD-10-CM

## 2019-09-14 DIAGNOSIS — N289 Disorder of kidney and ureter, unspecified: Secondary | ICD-10-CM | POA: Diagnosis not present

## 2019-09-14 LAB — GLUCOSE, CAPILLARY: Glucose-Capillary: 110 mg/dL — ABNORMAL HIGH (ref 70–99)

## 2019-09-14 MED ORDER — OXYCODONE-ACETAMINOPHEN 5-325 MG PO TABS: 1.0000 | ORAL_TABLET | ORAL | 0 refills | Status: DC | PRN

## 2019-09-14 MED ORDER — FLUDEOXYGLUCOSE F - 18 (FDG) INJECTION
5.1600 | Freq: Once | INTRAVENOUS | Status: AC | PRN
  Administered 2019-09-14: 5.16 via INTRAVENOUS

## 2019-09-18 ENCOUNTER — Other Ambulatory Visit: Payer: Self-pay | Admitting: *Deleted

## 2019-09-18 ENCOUNTER — Other Ambulatory Visit: Payer: Self-pay

## 2019-09-18 ENCOUNTER — Ambulatory Visit (INDEPENDENT_AMBULATORY_CARE_PROVIDER_SITE_OTHER): Payer: Medicare HMO

## 2019-09-18 DIAGNOSIS — Z0000 Encounter for general adult medical examination without abnormal findings: Secondary | ICD-10-CM

## 2019-09-18 MED ORDER — FENTANYL 25 MCG/HR TD PT72: 1.0000 | MEDICATED_PATCH | TRANSDERMAL | 0 refills | Status: DC

## 2019-09-18 NOTE — Patient Instructions (Signed)
Megan Olson , Thank you for taking time to come for your Medicare Wellness Visit. I appreciate your ongoing commitment to your health goals. Please review the following plan we discussed and let me know if I can assist you in the future.   Screening recommendations/referrals: Colorectal Screening: up to date; last colonoscopy 07/16/15 Mammogram: up to date; scheduled for 12/18/19 Bone Density: up to date; last 04/13/13  Vision and Dental Exams: Recommended annual ophthalmology exams for early detection of glaucoma and other disorders of the eye Recommended annual dental exams for proper oral hygiene  Vaccinations: Influenza vaccine: completed 06/13/19 Pneumococcal vaccine: up to date; last 03/29/15 Tdap vaccine: up to date; last 02/10/10 Shingles vaccine: See attached information- please discuss with oncologist   Advanced directives: Please bring a copy of your POA (Power of [place]) and/or Living Will to your next appointment.  Goals: Recommend to drink at least 6-8 8oz glasses of water per day and consume a balanced diet rich in fresh fruits and vegetables.   Next appointment: Please schedule your Annual Wellness Visit with your Nurse Health Advisor in one year.  Preventive Care 78 Years and Older, Female Preventive care refers to lifestyle choices and visits with your health care provider that can promote health and wellness. What does preventive care include?  A yearly physical exam. This is also called an annual well check.  Dental exams once or twice a year.  Routine eye exams. Ask your health care provider how often you should have your eyes checked.  Personal lifestyle choices, including:  Daily care of your teeth and gums.  Regular physical activity.  Eating a healthy diet.  Avoiding tobacco and drug use.  Limiting alcohol use.  Practicing safe sex.  Taking low-dose aspirin every day if recommended by your health care provider.  Taking vitamin and mineral supplements  as recommended by your health care provider. What happens during an annual well check? The services and screenings done by your health care provider during your annual well check will depend on your age, overall health, lifestyle risk factors, and family history of disease. Counseling  Your health care provider may ask you questions about your:  Alcohol use.  Tobacco use.  Drug use.  Emotional well-being.  Home and relationship well-being.  Sexual activity.  Eating habits.  History of falls.  Memory and ability to understand (cognition).  Work and work Statistician.  Reproductive health. Screening  You may have the following tests or measurements:  Height, weight, and BMI.  Blood pressure.  Lipid and cholesterol levels. These may be checked every 5 years, or more frequently if you are over 78 years old.  Skin check.  Lung cancer screening. You may have this screening every year starting at age 78 if you have a 30-pack-year history of smoking and currently smoke or have quit within the past 15 years.  Fecal occult blood test (FOBT) of the stool. You may have this test every year starting at age 78.  Flexible sigmoidoscopy or colonoscopy. You may have a sigmoidoscopy every 5 years or a colonoscopy every 10 years starting at age 78.  Hepatitis C blood test.  Hepatitis B blood test.  Sexually transmitted disease (STD) testing.  Diabetes screening. This is done by checking your blood sugar (glucose) after you have not eaten for a while (fasting). You may have this done every 1-3 years.  Bone density scan. This is done to screen for osteoporosis. You may have this done starting at age 78.  Mammogram. This may be done every 1-2 years. Talk to your health care provider about how often you should have regular mammograms. Talk with your health care provider about your test results, treatment options, and if necessary, the need for more tests. Vaccines  Your health care  provider may recommend certain vaccines, such as:  Influenza vaccine. This is recommended every year.  Tetanus, diphtheria, and acellular pertussis (Tdap, Td) vaccine. You may need a Td booster every 10 years.  Zoster vaccine. You may need this after age 78.  Pneumococcal 13-valent conjugate (PCV13) vaccine. One dose is recommended after age 78.  Pneumococcal polysaccharide (PPSV23) vaccine. One dose is recommended after age 78. Talk to your health care provider about which screenings and vaccines you need and how often you need them. This information is not intended to replace advice given to you by your health care provider. Make sure you discuss any questions you have with your health care provider. Document Released: 10/18/2015 Document Revised: 06/10/2016 Document Reviewed: 07/23/2015 Elsevier Interactive Patient Education  2017 [employer] Prevention in the Home Falls can cause injuries. They can happen to people of all ages. There are many things you can do to make your home safe and to help prevent falls. What can I do on the outside of my home?  Regularly fix the edges of walkways and driveways and fix any cracks.  Remove anything that might make you trip as you walk through a door, such as a raised step or threshold.  Trim any bushes or trees on the path to your home.  Use bright outdoor lighting.  Clear any walking paths of anything that might make someone trip, such as rocks or tools.  Regularly check to see if handrails are loose or broken. Make sure that both sides of any steps have handrails.  Any raised decks and porches should have guardrails on the edges.  Have any leaves, snow, or ice cleared regularly.  Use sand or salt on walking paths during winter.  Clean up any spills in your garage right away. This includes oil or grease spills. What can I do in the bathroom?  Use night lights.  Install grab bars by the toilet and in the tub and shower. Do  not use towel bars as grab bars.  Use non-skid mats or decals in the tub or shower.  If you need to sit down in the shower, use a plastic, non-slip stool.  Keep the floor dry. Clean up any water that spills on the floor as soon as it happens.  Remove soap buildup in the tub or shower regularly.  Attach bath mats securely with double-sided non-slip rug tape.  Do not have throw rugs and other things on the floor that can make you trip. What can I do in the bedroom?  Use night lights.  Make sure that you have a light by your bed that is easy to reach.  Do not use any sheets or blankets that are too big for your bed. They should not hang down onto the floor.  Have a firm chair that has side arms. You can use this for support while you get dressed.  Do not have throw rugs and other things on the floor that can make you trip. What can I do in the kitchen?  Clean up any spills right away.  Avoid walking on wet floors.  Keep items that you use a lot in easy-to-reach places.  If you need to reach  something above you, use a strong step stool that has a grab bar.  Keep electrical cords out of the way.  Do not use floor polish or wax that makes floors slippery. If you must use wax, use non-skid floor wax.  Do not have throw rugs and other things on the floor that can make you trip. What can I do with my stairs?  Do not leave any items on the stairs.  Make sure that there are handrails on both sides of the stairs and use them. Fix handrails that are broken or loose. Make sure that handrails are as long as the stairways.  Check any carpeting to make sure that it is firmly attached to the stairs. Fix any carpet that is loose or worn.  Avoid having throw rugs at the top or bottom of the stairs. If you do have throw rugs, attach them to the floor with carpet tape.  Make sure that you have a light switch at the top of the stairs and the bottom of the stairs. If you do not have them,  ask someone to add them for you. What else can I do to help prevent falls?  Wear shoes that:  Do not have high heels.  Have rubber bottoms.  Are comfortable and fit you well.  Are closed at the toe. Do not wear sandals.  If you use a stepladder:  Make sure that it is fully opened. Do not climb a closed stepladder.  Make sure that both sides of the stepladder are locked into place.  Ask someone to hold it for you, if possible.  Clearly mark and make sure that you can see:  Any grab bars or handrails.  First and last steps.  Where the edge of each step is.  Use tools that help you move around (mobility aids) if they are needed. These include:  Canes.  Walkers.  Scooters.  Crutches.  Turn on the lights when you go into a dark area. Replace any light bulbs as soon as they burn out.  Set up your furniture so you have a clear path. Avoid moving your furniture around.  If any of your floors are uneven, fix them.  If there are any pets around you, be aware of where they are.  Review your medicines with your doctor. Some medicines can make you feel dizzy. This can increase your chance of falling. Ask your doctor what other things that you can do to help prevent falls. This information is not intended to replace advice given to you by your health care provider. Make sure you discuss any questions you have with your health care provider. Document Released: 07/18/2009 Document Revised: 02/27/2016 Document Reviewed: 10/26/2014 Elsevier Interactive Patient Education  2017 [employer].

## 2019-09-18 NOTE — Telephone Encounter (Signed)
Patient requested refill of Fentanyl patch

## 2019-09-18 NOTE — Progress Notes (Signed)
This visit is being conducted via phone call due to the COVID-19 pandemic. This patient has given me verbal consent via phone to conduct this visit, patient states they are participating from their home address. Some vital signs may be absent or patient reported.   Patient identification: identified by name, DOB, and current address.  Location provider: [facility], Office Persons participating in the virtual visit: Denman George, patient, Dr. Garret Reddish   Subjective:   Megan Olson is a 78 y.o. female who presents for Medicare Annual (Subsequent) preventive examination.  Review of Systems:   Cardiac Risk Factors include: advanced age (>38mn, >>22women)    Objective:     Vitals: There were no vitals taken for this visit.  There is no height or weight on file to calculate BMI.  Advanced Directives 09/18/2019 09/11/2019 07/14/2019 12/01/2018 10/18/2018 09/06/2018 05/22/2015  Does Patient Have a Medical Advance Directive? Yes Yes No Yes Yes Yes Yes  Type of Advance Directive Living will;Healthcare Power of [place] will - [place] will [place]Living will HV[place]oc[place]Living will [place]Living will  Does patient want to make changes to medical advance directive? No - Patient declined - - No - Patient declined No - Patient declined No - Patient declined No - Patient declined  Copy of [place]oin Chart? No - copy requested - - No - copy requested No - copy requested No - copy requested No - copy requested  Would patient like information on creating a medical advance directive? - - No - Patient declined - - - -    Tobacco Social History   Tobacco Use  Smoking Status Former Smoker  . Packs/day: 0.50  . Years: 7.00  . Pack years: 3.50  . Types: Cigarettes  . Quit date: 01/04/1961  . Years since quitting: 58.7  Smokeless Tobacco Never Used       Counseling given: Not Answered   Clinical Intake:  Pre-visit preparation completed: Yes  Pain : No/denies pain  Diabetes: No  How often do you need to have someone help you when you read instructions, pamphlets, or other written materials from your doctor or pharmacy?: 1 - Never  Interpreter Needed?: No  Information entered by :: CDenman GeorgeLPN  Past Medical History:  Diagnosis Date  . [place] [place]    multiple myeloma  . Glaucoma   . HYPERTENSION 03/11/2007  . OSTEOPENIA 03/11/2007   Past Surgical History:  Procedure Laterality Date  . BREAST EXCISIONAL BIOPSY Right 2000  . BREAST LUMPECTOMY  1990   benign  . DILATION AND CURETTAGE OF UTERUS     bleeding at menopause. No uterine cancer  . IR RADIOLOGIST EVAL & MGMT  12/13/2018  . TONSILLECTOMY     age 78  Family History  Problem Relation Age of Onset  . Heart disease Mother        CHF mother died 821 . Arthritis Mother   . Glaucoma Mother        sister as well  . Alcohol abuse Father   . Suicidality Father   . Cancer Sister        lung cancer, smoker  . Heart disease Sister        aortic valve replacement  . Hyperlipidemia Brother   . Hypertension Brother   . COPD Brother   . Arthritis Sister   . Hypertension Sister   .  Glaucoma Sister   . Hashimoto's thyroiditis Sister   . Hypertension Son   . Stroke Maternal Grandmother   . Colon cancer Neg Hx   . Colon polyps Neg Hx    Social History   Socioeconomic History  . Marital status: Married    Spouse name: Not on file  . Number of children: Not on file  . Years of education: Not on file  . Highest education level: Not on file  Occupational History  . Not on file  Tobacco Use  . Smoking status: Former Smoker    Packs/day: 0.50    Years: 7.00    Pack years: 3.50    Types: Cigarettes    Quit date: 01/04/1961    Years since quitting: 58.7  . Smokeless tobacco: Never Used  Substance and Sexual Activity  . Alcohol use: Yes    Alcohol/week: 1.0  standard drinks    Types: 1 Standard drinks or equivalent per week  . Drug use: No  . Sexual activity: Not Currently  Other Topics Concern  . Not on file  Social History Narrative   Married. Lives with husband (patient of Dr. Yong Channel). 1 son. No grandkids. 1 granddog.       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 Difficulty of Paying Living Expenses: Not on file  Food Insecurity:   . Worried About Charity fundraiser in the Last Year: Not on file  . Ran Out of Food in the Last Year: Not on file  Transportation Needs:   . Lack of Transportation (Medical): Not on file  . Lack of Transportation (Non-Medical): Not on file  Physical Activity:   . Days of Exercise per Week: Not on file  . Minutes of Exercise per Session: Not on file  Stress:   . Feeling of Stress : Not on file  Social Connections:   . Frequency of Communication with Friends and Family: Not on file  . Frequency of Social Gatherings with Friends and Family: Not on file  . Attends Religious Services: Not on file  . Active Member of Clubs or Organizations: Not on file  . Attends Archivist Meetings: Not on file  . Marital Status: Not on file    Outpatient Encounter Medications as of 09/18/2019  Medication Sig  . acyclovir (ZOVIRAX) 400 MG tablet Take 1 tablet (400 mg total) by mouth 2 (two) times daily.  . ALPHAGAN P 0.1 % SOLN Apply 1 drop topically See admin instructions. Apply 2 drops in right eye 3 times a day  . amLODipine (NORVASC) 5 MG tablet Take 1 tablet (5 mg total) by mouth daily.  Marland Kitchen aspirin EC 81 MG tablet Take 81 mg by mouth daily.  . bimatoprost (LUMIGAN) 0.03 % ophthalmic solution Place 1 drop into the right eye at bedtime.   . Cholecalciferol (VITAMIN D3) 1000 UNITS CAPS Take 1,000 Units by mouth 2 (two) times  daily.   Marland Kitchen co-enzyme Q-10 50 MG capsule Take 50 mg by mouth daily.    . cyclophosphamide (CYTOXAN) 50 MG capsule TAKE 9 CAPSULES (450 MG TOTAL) BY MOUTH ONCE A WEEK. TAKE WITH FOOD TO MINIMIZE GI UPSET. TAKE EARLY IN THE DAY AND MAINTAIN HYDRATION.  Marland Kitchen dexamethasone (DECADRON) 4 MG tablet '20mg'$  (5 tabs) on Day 15 of  each cycle of treatment  . dorzolamide-timolol (COSOPT) 22.3-6.8 MG/ML ophthalmic solution Place 2 drops into both eyes 2 (two) times daily.   . famotidine (PEPCID) 10 MG tablet Take 10 mg by mouth 2 (two) times daily.  . fentaNYL (DURAGESIC) 25 MCG/HR Place 1 patch onto the skin every 3 (three) days.  . hydrOXYzine (ATARAX/VISTARIL) 25 MG tablet Take 1 tablet (25 mg total) by mouth 3 (three) times daily as needed.  . magnesium hydroxide (MILK OF MAGNESIA) 400 MG/5ML suspension Take 15 mLs by mouth daily as needed for mild constipation or moderate constipation.  . Magnesium Oxide 500 MG TABS Take 1 tablet by mouth daily.    . Multiple Vitamins-Minerals (MULTIVITAMIN WITH MINERALS) tablet Take 1 tablet by mouth daily.    . ondansetron (ZOFRAN) 8 MG tablet Take 1 tablet (8 mg total) by mouth 2 (two) times daily as needed (Nausea or vomiting).  Marland Kitchen oxyCODONE-acetaminophen (PERCOCET) 5-325 MG tablet Take 1-2 tablets by mouth every 4 (four) hours as needed for moderate pain or severe pain.  . polyethylene glycol (MIRALAX) packet Take 17 g by mouth daily.  . prochlorperazine (COMPAZINE) 10 MG tablet Take 1 tablet (10 mg total) by mouth every 6 (six) hours as needed (Nausea or vomiting).  Marland Kitchen senna-docusate (SENOKOT-S) 8.6-50 MG tablet Take 2 tablets by mouth at bedtime.  . vitamin C (ASCORBIC ACID) 500 MG tablet Take 500 mg by mouth daily.   No facility-administered encounter medications on file as of 09/18/2019.    Activities of Daily Living In your present state of health, do you have any difficulty performing the following activities: 09/18/2019 09/11/2019  Hearing? N N  Vision? N N    Difficulty concentrating or making decisions? N N  Walking or climbing stairs? N N  Dressing or bathing? N N  Doing errands, shopping? N -  Preparing Food and eating ? N -  Using the Toilet? N -  In the past six months, have you accidently leaked urine? N -  Do you have problems with loss of bowel control? N -  Managing your Medications? N -  Managing your Finances? N -  Housekeeping or managing your Housekeeping? N -  Some recent data might be hidden    Patient Care Team: [name], MD as PCP - General (Family Medicine) [name], MD as Consulting Physician (Hematology) Bond, [name], MD as Referring Physician (Ophthalmology) [name], [name], MD as Consulting Physician (Hematology and Oncology)    Assessment:   This is a routine wellness examination for Megan Olson.  Exercise Activities and Dietary recommendations Current Exercise Habits: Home exercise routine, Type of exercise: walking;Other - see comments(housework activities), Time (Minutes): 30, Frequency (Times/Week): 5, Weekly Exercise (Minutes/Week): 150, Intensity: Mild  Goals    . Exercise 150 min/wk Moderate Activity     Increase your exercise from 2 days a week to 3 days a week. Looking at joining [place]  09/18/2019 09/06/2018 04/04/2018 04/02/2017 04/01/2016  Falls in the past year? 0 0 No No No  Injury with Fall? 0 - - - -  Risk for fall due to : Medication side effect - - - -  Follow up Falls evaluation completed;Education provided;Falls prevention discussed - - - -   Is the patient's home free of loose throw rugs in walkways, pet beds, electrical cords, etc?   yes      Grab bars in the bathroom? yes  Handrails on the stairs?   yes      Adequate lighting?   yes   Depression Screen PHQ 2/9 Scores 09/18/2019 09/06/2018 04/04/2018 04/02/2017  PHQ - 2 Score 0 0 0 0  PHQ- 9 Score - 0 - -     Cognitive Function- no cognitive concerns at this  time Cognitive Testing  Alert? Yes         Normal Appearance? N/a  Oriented to person? Yes           Place? Yes  Time? Yes  Recall of three objects? Yes  Can perform simple calculations? Yes  Displays appropriate judgment? Yes  Can read the correct time from a watch face? Yes   MMSE - Mini Mental State Exam 09/06/2018  Orientation to time 5  Orientation to Place 5  Registration 3  Attention/ Calculation 5  Recall 3  Language- name 2 objects 2  Language- repeat 1  Language- follow 3 step command 3  Language- read & follow direction 1  Write a sentence 1  Copy design 1  Total score 30      Immunization History  Administered Date(s) Administered  . Fluad Quad(high Dose 65+) 06/13/2019  . Influenza Split 07/19/2012  . Influenza Whole 07/17/2008, 06/28/2009, 06/25/2010  . Influenza, High Dose Seasonal PF 07/22/2016, 06/24/2017, 06/23/2018  . Influenza,inj,Quad PF,6+ Mos 06/27/2013, 06/18/2014  . Influenza-Unspecified 07/03/2015  . Pneumococcal Conjugate-13 03/29/2015  . Pneumococcal Polysaccharide-23 04/04/2006  . Td 02/10/2010  . Zoster 09/26/2008    Qualifies for Shingles Vaccine? Patient will discuss with oncologist   Screening Tests Health Maintenance  Topic Date Due  . TETANUS/TDAP  02/11/2020  . COLONOSCOPY  07/15/2020  . INFLUENZA VACCINE  Completed  . DEXA SCAN  Completed  . PNA vac Low Risk Adult  Completed    Cancer Screenings: Lung: Low Dose CT Chest recommended if Age 36-80 years, 30 pack-year currently smoking OR have quit w/in 15years. Patient does not qualify. Breast:  Up to date on Mammogram? Yes   Up to date of Bone Density/Dexa? Yes Colorectal: colonoscopy 07/16/15  Plan:  I have personally reviewed and addressed the Medicare Annual Wellness questionnaire and have noted the following in the patient's chart:  A. Medical and social history B. Use of alcohol, tobacco or illicit drugs  C. Current medications and supplements D. Functional ability  and status E.  Nutritional status F.  Physical activity G. Advance directives H. List of other physicians I.  Hospitalizations, surgeries, and ER visits in previous 12 months J.  [place] such as hearing and vision if needed, cognitive and depression L. Referrals, records requested, and appointments- none   In addition, I have reviewed and discussed with patient certain preventive protocols, quality metrics, and best practice recommendations. A written personalized care plan for preventive services as well as general preventive health recommendations were provided to patient.   Signed,  Denman George, LPN  Nurse Health Advisor   Nurse Notes:  No additional

## 2019-09-19 ENCOUNTER — Other Ambulatory Visit: Payer: Self-pay

## 2019-09-19 ENCOUNTER — Inpatient Hospital Stay: Payer: Medicare HMO

## 2019-09-19 ENCOUNTER — Inpatient Hospital Stay (HOSPITAL_BASED_OUTPATIENT_CLINIC_OR_DEPARTMENT_OTHER): Payer: Medicare HMO | Admitting: Hematology

## 2019-09-19 VITALS — BP 135/78 | HR 79 | Temp 98.7°F | Resp 18 | Ht 62.5 in | Wt 106.4 lb

## 2019-09-19 DIAGNOSIS — C9000 Multiple myeloma not having achieved remission: Secondary | ICD-10-CM | POA: Diagnosis not present

## 2019-09-19 DIAGNOSIS — I2510 Atherosclerotic heart disease of native coronary artery without angina pectoris: Secondary | ICD-10-CM | POA: Diagnosis not present

## 2019-09-19 DIAGNOSIS — M549 Dorsalgia, unspecified: Secondary | ICD-10-CM | POA: Diagnosis not present

## 2019-09-19 DIAGNOSIS — I10 Essential (primary) hypertension: Secondary | ICD-10-CM | POA: Diagnosis not present

## 2019-09-19 DIAGNOSIS — K5900 Constipation, unspecified: Secondary | ICD-10-CM | POA: Diagnosis not present

## 2019-09-19 DIAGNOSIS — D61818 Other pancytopenia: Secondary | ICD-10-CM | POA: Diagnosis not present

## 2019-09-19 DIAGNOSIS — R21 Rash and other nonspecific skin eruption: Secondary | ICD-10-CM | POA: Diagnosis not present

## 2019-09-19 DIAGNOSIS — R600 Localized edema: Secondary | ICD-10-CM | POA: Diagnosis not present

## 2019-09-19 DIAGNOSIS — Z7189 Other specified counseling: Secondary | ICD-10-CM

## 2019-09-19 DIAGNOSIS — M8580 Other specified disorders of bone density and structure, unspecified site: Secondary | ICD-10-CM | POA: Diagnosis not present

## 2019-09-19 DIAGNOSIS — Z5111 Encounter for antineoplastic chemotherapy: Secondary | ICD-10-CM | POA: Diagnosis not present

## 2019-09-19 LAB — CMP (CANCER CENTER ONLY)
ALT: 17 U/L (ref 0–44)
AST: 18 U/L (ref 15–41)
Albumin: 3.8 g/dL (ref 3.5–5.0)
Alkaline Phosphatase: 34 U/L — ABNORMAL LOW (ref 38–126)
Anion gap: 8 (ref 5–15)
BUN: 15 mg/dL (ref 8–23)
CO2: 28 mmol/L (ref 22–32)
Calcium: 8.7 mg/dL — ABNORMAL LOW (ref 8.9–10.3)
Chloride: 102 mmol/L (ref 98–111)
Creatinine: 0.64 mg/dL (ref 0.44–1.00)
GFR, Est AFR Am: >60 mL/min (ref >60)
GFR, Estimated: >60 mL/min (ref >60)
Glucose, Bld: 109 mg/dL — ABNORMAL HIGH (ref 70–99)
Potassium: 4.2 mmol/L (ref 3.5–5.1)
Sodium: 138 mmol/L (ref 135–145)
Total Bilirubin: 0.2 mg/dL — ABNORMAL LOW (ref 0.3–1.2)
Total Protein: 5.9 g/dL — ABNORMAL LOW (ref 6.5–8.1)

## 2019-09-19 LAB — CBC WITH DIFFERENTIAL/PLATELET
Abs Immature Granulocytes: 0.02 10*3/uL (ref 0.00–0.07)
Basophils Absolute: 0 10*3/uL (ref 0.0–0.1)
Basophils Relative: 0 %
Eosinophils Absolute: 0.1 10*3/uL (ref 0.0–0.5)
Eosinophils Relative: 2 %
HCT: 29.1 % — ABNORMAL LOW (ref 36.0–46.0)
Hemoglobin: 9.8 g/dL — ABNORMAL LOW (ref 12.0–15.0)
Immature Granulocytes: 0 %
Lymphocytes Relative: 1 %
Lymphs Abs: 0.1 10*3/uL — ABNORMAL LOW (ref 0.7–4.0)
MCH: 35.9 pg — ABNORMAL HIGH (ref 26.0–34.0)
MCHC: 33.7 g/dL (ref 30.0–36.0)
MCV: 106.6 fL — ABNORMAL HIGH (ref 80.0–100.0)
Monocytes Absolute: 0.4 10*3/uL (ref 0.1–1.0)
Monocytes Relative: 7 %
Neutro Abs: 4.7 10*3/uL (ref 1.7–7.7)
Neutrophils Relative %: 90 %
Platelets: 265 10*3/uL (ref 150–400)
RBC: 2.73 MIL/uL — ABNORMAL LOW (ref 3.87–5.11)
RDW: 14.4 % (ref 11.5–15.5)
WBC: 5.3 10*3/uL (ref 4.0–10.5)
nRBC: 0 % (ref 0.0–0.2)

## 2019-09-19 LAB — TSH: TSH: 1.006 u[IU]/mL (ref 0.308–3.960)

## 2019-09-19 LAB — VITAMIN D 25 HYDROXY (VIT D DEFICIENCY, FRACTURES): Vit D, 25-Hydroxy: 58.63 ng/mL (ref 30–100)

## 2019-09-19 LAB — T4, FREE: Free T4: 1.1 ng/dL (ref 0.61–1.12)

## 2019-09-19 MED ORDER — BORTEZOMIB CHEMO SQ INJECTION 3.5 MG (2.5MG/ML)
1.3000 mg/m2 | Freq: Once | INTRAMUSCULAR | Status: AC
  Administered 2019-09-19: 2 mg via SUBCUTANEOUS
  Filled 2019-09-19: qty 0.8

## 2019-09-19 MED ORDER — DEXAMETHASONE 4 MG PO TABS
ORAL_TABLET | ORAL | Status: AC
  Filled 2019-09-19: qty 5

## 2019-09-19 MED ORDER — PROCHLORPERAZINE MALEATE 10 MG PO TABS
ORAL_TABLET | ORAL | Status: AC
  Filled 2019-09-19: qty 1

## 2019-09-19 MED ORDER — PROCHLORPERAZINE MALEATE 10 MG PO TABS
10.0000 mg | ORAL_TABLET | Freq: Once | ORAL | Status: AC
  Administered 2019-09-19: 10 mg via ORAL

## 2019-09-19 MED ORDER — DEXAMETHASONE 4 MG PO TABS
10.0000 mg | ORAL_TABLET | Freq: Once | ORAL | Status: AC
  Administered 2019-09-19: 10 mg via ORAL

## 2019-09-19 NOTE — Patient Instructions (Signed)
[facility] Discharge Instructions for Patients Receiving Chemotherapy  Today you received the following chemotherapy agents Velcade  To help prevent nausea and vomiting after your treatment, we encourage you to take your nausea medication as directed by your MD.   If you develop nausea and vomiting that is not controlled by your nausea medication, call the clinic.   BELOW ARE SYMPTOMS THAT SHOULD BE REPORTED IMMEDIATELY:  *FEVER GREATER THAN 100.5 F  *CHILLS WITH OR WITHOUT FEVER  NAUSEA AND VOMITING THAT IS NOT CONTROLLED WITH YOUR NAUSEA MEDICATION  *UNUSUAL SHORTNESS OF BREATH  *UNUSUAL BRUISING OR BLEEDING  TENDERNESS IN MOUTH AND THROAT WITH OR WITHOUT PRESENCE OF ULCERS  *URINARY PROBLEMS  *BOWEL PROBLEMS  UNUSUAL RASH Items with * indicate a potential emergency and should be followed up as soon as possible.  Feel free to call the clinic should you have any questions or concerns. The clinic phone number is [phone].  Please show the CHEMO ALERT CARD at check-in to the Emergency Department and triage nurse.  Coronavirus (COVID-19) Are you at risk?  Are you at risk for the Coronavirus (COVID-19)?  To be considered HIGH RISK for Coronavirus (COVID-19), you have to meet the following criteria:  . Traveled to [place], [place], [place], [place] or [place]; or in the [place] to [place], [place], [place], or [place]; and have fever, cough, and shortness of breath within the last 2 weeks of travel OR . Been in close contact with a person diagnosed with COVID-19 within the last 2 weeks and have fever, cough, and shortness of breath . IF YOU DO NOT MEET THESE CRITERIA, YOU ARE CONSIDERED LOW RISK FOR COVID-19.  What to do if you are HIGH RISK for COVID-19?  . If you are having a medical emergency, call 911. . Seek medical care right away. Before you go to a doctor's office, urgent care or emergency department, call ahead and tell them about  your recent travel, contact with someone diagnosed with COVID-19, and your symptoms. You should receive instructions from your physician's office regarding next steps of care.  . When you arrive at healthcare provider, tell the healthcare staff immediately you have returned from visiting [place], [place], [place], [place] or [place]; or traveled in the [place] to [place], [place], [place], or [place]; in the last two weeks or you have been in close contact with a person diagnosed with COVID-19 in the last 2 weeks.   . Tell the health care staff about your symptoms: fever, cough and shortness of breath. . After you have been seen by a medical provider, you will be either: o Tested for (COVID-19) and discharged home on quarantine except to seek medical care if symptoms worsen, and asked to  - Stay home and avoid contact with others until you get your results (4-5 days)  - Avoid travel on public transportation if possible (such as bus, train, or airplane) or o Sent to the Emergency Department by EMS for evaluation, COVID-19 testing, and possible admission depending on your condition and test results.  What to do if you are LOW RISK for COVID-19?  Reduce your risk of any infection by using the same precautions used for avoiding the common cold or flu:  . Wash your hands often with soap and warm water for at least 20 seconds.  If soap and water are not readily available, use an alcohol-based hand sanitizer with at least 60% alcohol.  . If   coughing or sneezing, cover your mouth and nose by coughing or sneezing into the elbow areas of your shirt or coat, into a tissue or into your sleeve (not your hands). . Avoid shaking hands with others and consider head nods or verbal greetings only. . Avoid touching your eyes, nose, or mouth with unwashed hands.  . Avoid close contact with people who are sick. . Avoid places or events with large numbers of people in one location, like concerts or sporting  events. . Carefully consider travel plans you have or are making. . If you are planning any travel outside or inside the [place], visit the CDC's Travelers' Health webpage for the latest health notices. . If you have some symptoms but not all symptoms, continue to monitor at home and seek medical attention if your symptoms worsen. . If you are having a medical emergency, call 911.   ADDITIONAL HEALTHCARE OPTIONS FOR PATIENTS  [facility] / e-Visit: [email or web address]         [facility]: [phone]  [facility]: [phone]                   [facility]: [phone]    

## 2019-09-19 NOTE — Progress Notes (Signed)
HEMATOLOGY/ONCOLOGY CLINIC NOTE  Date of Service: 09/19/2019  Patient Care Team: [name], MD as PCP - General (Family Medicine) [name], MD as Consulting Physician (Hematology) Bond, [name], MD as Referring Physician (Ophthalmology) [name], [name], MD as Consulting Physician (Hematology and Oncology)  Dr. Ronney Lion as [place] Specialist  [phone]  CHIEF COMPLAINTS/PURPOSE OF CONSULTATION:   Multiple Myeloma- continued management  HISTORY OF PRESENTING ILLNESS:   Megan Olson is a wonderful 77 y.o. female who has been referred to [place] by Dr. 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 She is accompanied today by her husband. The pt reports that she is doing well overall.   The pt notes that she was doing extensive yard work in October 2019, and began to feel back pain a few days after this, which she attributed to muscle pain. She recalls taking a deep breath, and developing sudden acute pain, and presented to care with her PCP's office. She began exercises for her back pain, without relief, then began PT without relief, then was referred to Dr. Paulla Fore in sports medicine in late January 2020. She had an XR which revealed a compression fracture at T11, then subsequently had an MRI, and a bone marrow biopsy. The pt notes that her back pain "seemed to move around." She endorses pain "up the whole left side" of her back.  The pt notes that she is not able to stand up straight due to her back pain, which she feels limits her ability to take a deep breath, and endorses pain exacerbation when she does take a deep breath. The pt needs assistance from sitting to lying from her husband. She is using about 3 Percocet a day.  The pt notes worsened constipation since beginning Percocet, and notes that her most recent laxative order was not covered by her insurance. She is using prune juice and  milk of magnesia. She took Senokot S BID without relief.  The pt reports that prior to her recent back pain, she had very few medical concerns. She endorses controlled BP, and has been monitored for DM but after closely watching her diet her A1C decreased to 5.8. She has glaucoma, and has had surgery in both eyes. Right eye with stent. The pt sees Dr. Edilia Bo at [facility] for her eye care. The pt notes that her vision has been recently "pretty good." The pt notes that she has been advised to limit treatment with steroids for her glaucoma. She denies heart or lung problems. Denies previous back problems. Last DEXA scan 3 years ago, and endorses osteopenia. She notes that she took Fosamax for 3-4 years, and stopped 5-6 years ago. She takes Vitamin D, a multivitamin, and magnesium.  The pt notes that she quit smoking cigarettes when she was 27, started when she was about 20. The pt consumes alcohol rarely, but not since beginning narcotics. 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  Of note prior to the patient's visit today, pt has had an MRI Thoracic Spine completed on 11/27/18 with results revealing Multifocal marrow signal abnormality consistent with metastatic disease or multiple myeloma. The patient has multiple compression fractures most consistent with pathologic injuries. Extensive marrow signal abnormality makes determining age of the fractures difficult but edema is most intense in T3. 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  Most recent  lab results (12/01/18) of CBC w/diff is as follows: all values are WNL except for RBC at 3.17, HGB at 10.4, HCT at 33.5, MCV at 105.7. 11/28/18 CMP revealed all values WNL except for Glucose at 105, Total Protein at 10.2, Albumin at 3.1 11/30/18 24HR UPEP revealed all values WNL except for M-spike at 75.1%. 11/28/18 Bega-2 microglobulin slightly elevated at 2.6 11/28/18 SPEP  revealed M spike at 4.6g 11/28/18 Immunoglobulins revealed IgG at 6181, IgA at 24, IgM at 16, and IgE at 6.  On review of systems, pt reports constipation, back pain, stable energy levels, ankle swelling, tenderness at T3, lower back pain, and denies abdominal pains, neck pain, and any other symptoms.   On PMHx the pt reports redundant colon, single hemorrhoid, glaucoma, HTN, osteopenia, Tonsillectomy, right eye stent and multiple eye surgeries. On Social Hx the pt reports rare alcohol use, smoked cigarettes between ages 49 and 44. 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 On Family Hx the pt reports sister died from small cell lung cancer and was a lifelong smoker, brother's daughter died of breast cancer with BRCA1 and BRCA2 mutations. Cousin with female breast cancer.   INTERVAL HISTORY:   Megan Olson returns today for management and evaluation of her multiple myeloma and C14D1 of Vel[place]. The patient's last visit with [place] was on 09/05/2019. The pt reports that she is doing well overall.  The pt reports that her pain is currently well controlled and she is still trying to lower her intake of Percocet. Pt denies any abdominal pain or bloating.  Of note since the patient's last visit, pt has had PET/CT ([id number]) completed on 09/14/2019 with results revealing "1. No findings of active malignancy. 2. Numerous thoracolumbar compression fractures many of which may be associated with osteoporosis. 3. Calcified 1.5 cm in diameter left thyroid nodule with persistent elevated activity. A significant minority of hypermetabolic thyroid nodules can be malignant, if not previously worked up to then thyroid ultrasound would be recommended as a next step. 4. There a few small lucent and small sclerotic lesions in the skeleton which are not hypermetabolic, possibilities include benign lesions or previously effectively treated myeloma. 5. Other imaging findings of potential clinical significance: Aortic Atherosclerosis  (ICD10-I70.0). Coronary atherosclerosis. Prominent stool throughout the colon favors constipation. Hyperdense right renal lesion is probably a complex cyst but technically nonspecific."  Of note since the patient's last visit, pt has had Bone Marrow Report ([id number]) completed on 09/11/2019 with results revealing "BONE MARROW, ASPIRATE, CLOT, CORE:  -Variably cellular bone marrow with trilineage hematopoiesis and 2% plasma cells PERIPHERAL BLOOD:  -Pancytopenia".  Of note since the patient's last visit, pt has had Flow Pathology Report [id number]) completed on 09/11/2019 with results revealing "-Predominance of T lymphocytes with relative abundance of CD8 positive cells  -No significant B-cell population identified -Relative abundance of natural killer cells".  Lab results today (09/19/19) of CBC w/diff and CMP is as follows: all values are WNL except for RBC at 2.73, Hgb at 9.8, HCT at 29.1, MCV at 106.6, MCH at 35.9, Lymphs Abs at 0.1K, Glucose at 109, Calcium at 8.7, Total Protein at 5.9, Alkaline Phosphatase at 34, Total Bilirubin at 0.2. 09/19/2019 TSH at 1.006 09/19/2019 T4, free is in progress 09/19/2019 Vitamin D 25-hydroxy is in progress 09/19/2019 MMP is in progress   On review of systems, pt denies abdominal pain, abdominal bloating and any other symptoms.   MEDICAL HISTORY:  Past Medical History:  Diagnosis Date  . Cancer ([place])  multiple myeloma  . Glaucoma   . HYPERTENSION 03/11/2007  . OSTEOPENIA 03/11/2007    SURGICAL HISTORY: Past Surgical History:  Procedure Laterality Date  . BREAST EXCISIONAL BIOPSY Right 2000  . BREAST LUMPECTOMY  1990   benign  . DILATION AND CURETTAGE OF UTERUS     bleeding at menopause. No uterine cancer  . IR RADIOLOGIST EVAL & MGMT  12/13/2018  . TONSILLECTOMY     age 76    SOCIAL HISTORY: Social History   Socioeconomic History  . Marital status: Married    Spouse name: Not on file  . Number of children: Not on file  . Years  of education: Not on file  . Highest education level: Not on file  Occupational History  . Not on file  Tobacco Use  . Smoking status: Former Smoker    Packs/day: 0.50    Years: 7.00    Pack years: 3.50    Types: Cigarettes    Quit date: 01/04/1961    Years since quitting: 58.7  . Smokeless tobacco: Never Used  Substance and Sexual Activity  . Alcohol use: Yes    Alcohol/week: 1.0 standard drinks    Types: 1 Standard drinks or equivalent per week  . Drug use: No  . Sexual activity: Not Currently  Other Topics Concern  . Not on file  Social History Narrative   Married. Lives with husband (patient of Dr. Yong Channel). 1 son. No grandkids. 1 granddog.       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 Difficulty of Paying Living Expenses: Not on file  Food Insecurity:   . Worried About Charity fundraiser in the Last Year: Not on file  . Ran Out of Food in the Last Year: Not on file  Transportation Needs:   . Lack of Transportation (Medical): Not on file  . Lack of Transportation (Non-Medical): Not on file  Physical Activity:   . Days of Exercise per Week: Not on file  . Minutes of Exercise per Session: Not on file  Stress:   . Feeling of Stress : Not on file  Social Connections:   . Frequency of Communication with Friends and Family: Not on file  . Frequency of Social Gatherings with Friends and Family: Not on file  . Attends Religious Services: Not on file  . Active Member of Clubs or Organizations: Not on file  . Attends Archivist Meetings: Not on file  . Marital Status: Not on file  Intimate Partner Violence:   . Fear of Current or Ex-Partner: Not on file  . Emotionally Abused: Not on file  . Physically Abused: Not on file  . Sexually Abused: Not on file    FAMILY HISTORY: Family  History  Problem Relation Age of Onset  . Heart disease Mother        CHF mother died 18  . Arthritis Mother   . Glaucoma Mother        sister as well  . Alcohol abuse Father   . Suicidality Father   . Cancer Sister        lung cancer, smoker  . Heart disease Sister        aortic valve replacement  . Hyperlipidemia Brother   . Hypertension Brother   .  COPD Brother   . Arthritis Sister   . Hypertension Sister   . Glaucoma Sister   . Hashimoto's thyroiditis Sister   . Hypertension Son   . Stroke Maternal Grandmother   . Colon cancer Neg Hx   . Colon polyps Neg Hx     ALLERGIES:  is allergic to ace inhibitors; benadryl [diphenhydramine]; diamox [acetazolamide]; sulfamethoxazole; lenalidomide; and penicillins.   MEDICATIONS:  Current Outpatient Medications  Medication Sig Dispense Refill  . acyclovir (ZOVIRAX) 400 MG tablet Take 1 tablet (400 mg total) by mouth 2 (two) times daily. 60 tablet 5  . ALPHAGAN P 0.1 % SOLN Apply 1 drop topically See admin instructions. Apply 2 drops in right eye 3 times a day    . amLODipine (NORVASC) 5 MG tablet Take 1 tablet (5 mg total) by mouth daily. 90 tablet 3  . aspirin EC 81 MG tablet Take 81 mg by mouth daily.    . bimatoprost (LUMIGAN) 0.03 % ophthalmic solution Place 1 drop into the right eye at bedtime.     . Cholecalciferol (VITAMIN D3) 1000 UNITS CAPS Take 1,000 Units by mouth 2 (two) times daily.     Marland Kitchen co-enzyme Q-10 50 MG capsule Take 50 mg by mouth daily.      . cyclophosphamide (CYTOXAN) 50 MG capsule TAKE 9 CAPSULES (450 MG TOTAL) BY MOUTH ONCE A WEEK. TAKE WITH FOOD TO MINIMIZE GI UPSET. TAKE EARLY IN THE DAY AND MAINTAIN HYDRATION. 36 capsule 2  . dexamethasone (DECADRON) 4 MG tablet 28m (5 tabs) on Day 15 of each cycle of treatment 15 tablet 3  . dorzolamide-timolol (COSOPT) 22.3-6.8 MG/ML ophthalmic solution Place 2 drops into both eyes 2 (two) times daily.   11  . famotidine (PEPCID) 10 MG tablet Take 10 mg by mouth 2 (two)  times daily.    . fentaNYL (DURAGESIC) 25 MCG/HR Place 1 patch onto the skin every 3 (three) days. 10 patch 0  . hydrOXYzine (ATARAX/VISTARIL) 25 MG tablet Take 1 tablet (25 mg total) by mouth 3 (three) times daily as needed. 30 tablet 0  . magnesium hydroxide (MILK OF MAGNESIA) 400 MG/5ML suspension Take 15 mLs by mouth daily as needed for mild constipation or moderate constipation. 355 mL 0  . Magnesium Oxide 500 MG TABS Take 1 tablet by mouth daily.      . Multiple Vitamins-Minerals (MULTIVITAMIN WITH MINERALS) tablet Take 1 tablet by mouth daily.      . ondansetron (ZOFRAN) 8 MG tablet Take 1 tablet (8 mg total) by mouth 2 (two) times daily as needed (Nausea or vomiting). 30 tablet 1  . oxyCODONE-acetaminophen (PERCOCET) 5-325 MG tablet Take 1-2 tablets by mouth every 4 (four) hours as needed for moderate pain or severe pain. 90 tablet 0  . polyethylene glycol (MIRALAX) packet Take 17 g by mouth daily. 30 each 1  . prochlorperazine (COMPAZINE) 10 MG tablet Take 1 tablet (10 mg total) by mouth every 6 (six) hours as needed (Nausea or vomiting). 30 tablet 1  . senna-docusate (SENOKOT-S) 8.6-50 MG tablet Take 2 tablets by mouth at bedtime. 60 tablet 2  . vitamin C (ASCORBIC ACID) 500 MG tablet Take 500 mg by mouth daily.     No current facility-administered medications for this visit.    REVIEW OF SYSTEMS:   A 10+ POINT REVIEW OF SYSTEMS WAS OBTAINED including neurology, dermatology, psychiatry, cardiac, respiratory, lymph, extremities, GI, GU, Musculoskeletal, constitutional, breasts, reproductive, HEENT.  All pertinent positives are noted in the HPI.  All others are negative.   PHYSICAL EXAMINATION: ECOG PERFORMANCE STATUS: 1-2  Vitals:   09/19/19 1450  BP: 135/78  Pulse: 79  Resp: 18  Temp: 98.7 F (37.1 C)  SpO2: 98%   Filed Weights   09/19/19 1450  Weight: 106 lb 6.4 oz (48.3 kg)   Body mass index is 19.15 kg/m.   Exam was given in a wheelchair   GENERAL:alert, in no  acute distress and comfortable SKIN: no acute rashes, no significant lesions EYES: conjunctiva are pink and non-injected, sclera anicteric OROPHARYNX: MMM, no exudates, no oropharyngeal erythema or ulceration NECK: supple, no JVD LYMPH:  no palpable lymphadenopathy in the cervical, axillary or inguinal regions LUNGS: clear to auscultation b/l with normal respiratory effort HEART: regular rate & rhythm ABDOMEN:  normoactive bowel sounds , non tender, not distended. No palpable hepatosplenomegaly.  Extremity: no pedal edema PSYCH: alert & oriented x 3 with fluent speech NEURO: no focal motor/sensory deficits  LABORATORY DATA:  I have reviewed the data as listed  . CBC Latest Ref Rng & Units 09/19/2019 09/11/2019 09/05/2019  WBC 4.0 - 10.5 K/uL 5.3 3.0(L) 2.3(L)  Hemoglobin 12.0 - 15.0 g/dL 9.8(L) 11.2(L) 10.5(L)  Hematocrit 36.0 - 46.0 % 29.1(L) 35.0(L) 31.4(L)  Platelets 150 - 400 K/uL 265 148(L) 206    . CMP Latest Ref Rng & Units 09/19/2019 09/05/2019 08/29/2019  Glucose 70 - 99 mg/dL 109(H) 91 96  BUN 8 - 23 mg/dL _0 Creatinine 0.44 - 1.00 mg/dL 0.64 0.63 0.62  Sodium 135 - 145 mmol/L 138 141 142  Potassium 3.5 - 5.1 mmol/L 4.2 4.1 4.2  Chloride 98 - 111 mmol/L 102 103 104  CO2 22 - 32 mmol/L _1 Calcium 8.9 - 10.3 mg/dL 8.7(L) 9.0 8.8(L)  Total Protein 6.5 - 8.1 g/dL 5.9(L) 6.1(L) 6.0(L)  Total Bilirubin 0.3 - 1.2 mg/dL 0.2(L) 0.3 0.3  Alkaline Phos 38 - 126 U/L 34(L) 33(L) 34(L)  AST 15 - 41 U/L _2 ALT 0 - 44 U/L _3 09/11/2019 Flow Pathology Report [id number]):   09/11/2019 Bone Marrow Report [id number]):    07/14/2019 Cytogenetics ([id number]):    07/14/2019 Flow Pathology Report ([id number]):   07/14/2019 BM Bx ([id number]):   Most recent MMP 04/25/2019 Component     Latest Ref Rng & Units 04/25/2019  IgG (Immunoglobin G), Serum     586 - 1,602 mg/dL 702  IgA     64 - 422 mg/dL 39 (L)  IgM (Immunoglobulin M),  Srm     26 - 217 mg/dL 26  Total Protein ELP     6.0 - 8.5 g/dL 5.9 (L)  Albumin SerPl Elph-Mcnc     2.9 - 4.4 g/dL 3.8  Alpha 1     0.0 - 0.4 g/dL 0.2  Alpha2 Glob SerPl Elph-Mcnc     0.4 - 1.0 g/dL 0.7  B-Globulin SerPl Elph-Mcnc     0.7 - 1.3 g/dL 1.0  Gamma Glob SerPl Elph-Mcnc     0.4 - 1.8 g/dL 0.3 (L)  M Protein SerPl Elph-Mcnc     Not Observed g/dL 0.4 (H)  Globulin, Total     2.2 - 3.9 g/dL 2.1 (L)  Albumin/Glob SerPl     0.7 - 1.7 1.9 (H)  IFE 1      Comment  Please Note (HCV):      Comment    12/01/18 BM Bx:   12/01/18 Flow Cytometry:  RADIOGRAPHIC STUDIES: I have personally reviewed the radiological images as listed and agreed with the findings in the report. NM PET Image Restage (PS) Whole Body  Result Date: 09/14/2019 CLINICAL DATA:  Subsequent treatment strategy for multiple myeloma. EXAM: NUCLEAR MEDICINE PET WHOLE BODY TECHNIQUE: Multiple exams, 5.2 mCi F-18 FDG was injected intravenously. Full-ring PET imaging was performed from the skull base to thigh after the radiotracer. CT data was obtained and used for attenuation correction and anatomic localization. Fasting blood glucose: 110 mg/dl COMPARISON:  Multiple exams, including 07/17/2019 FINDINGS: Mediastinal blood pool activity: SUV max 1.8 HEAD/NECK: Calcified 1.5 cm in diameter left thyroid nodule maximum SUV 6.7, previously 7.7. Incidental CT findings: Mild bilateral common carotid atherosclerotic calcification. CHEST: No significant abnormal hypermetabolic activity in this region. Incidental CT findings: Coronary, aortic arch, and branch vessel atherosclerotic vascular disease. Stable scarring in the right middle lobe and left lower lobe. ABDOMEN/PELVIS: Accentuated activity along a left pelvic loop of bowel, not previously present, maximum SUV 8.2, likely physiologic. Incidental CT findings: Prominent stool throughout the colon favors constipation. Mildly dilated gallbladder. Small hypodense  hepatic lesions are not perceptibly hypermetabolic and are similar prior. A left mid to lower kidney 1.2 by 0.8 cm hyperdense lesion is probably a complex cyst given the internal homogeneity, but technically nonspecific. Aortoiliac atherosclerotic vascular disease. SKELETON: No hypermetabolic bony lesions are identified. Stable small lucent and sclerotic bony lesions, such as the 1.2 cm lucency in the right pubic body and the 0.6 cm sclerotic lesion in the left ischial tuberosity, but these demonstrate no accentuated metabolic activity. Incidental CT findings:Multilevel thoracolumbar compression fractures some with associated sclerosis. EXTREMITIES: No significant abnormal hypermetabolic activity in this region. Incidental CT findings: none IMPRESSION: 1. No findings of active malignancy. 2. Numerous thoracolumbar compression fractures many of which may be associated with osteoporosis. 3. Calcified 1.5 cm in diameter left thyroid nodule with persistent elevated activity. A significant minority of hypermetabolic thyroid nodules can be malignant, if not previously worked up to then thyroid ultrasound would be recommended as a next step. 4. There a few small lucent and small sclerotic lesions in the skeleton which are not hypermetabolic, possibilities include benign lesions or previously effectively treated myeloma. 5. Other imaging findings of potential clinical significance: Aortic Atherosclerosis (ICD10-I70.0). Coronary atherosclerosis. Prominent stool throughout the colon favors constipation. Hyperdense right renal lesion is probably a complex cyst but technically nonspecific. Electronically Signed   By: Van Clines M.D.   On: 09/14/2019 13:02   CT Biopsy  Result Date: 09/11/2019 INDICATION: History of multiple myeloma. Please perform CT-guided bone marrow biopsy for tissue diagnostic purposes. EXAM: CT-GUIDED BONE MARROW BIOPSY AND ASPIRATION MEDICATIONS: None ANESTHESIA/SEDATION: Fentanyl 100 mcg IV;  Versed 2 mg IV Sedation Time: 10 Minutes; The patient was continuously monitored during the procedure by the interventional radiology nurse under my direct supervision. COMPLICATIONS: None immediate. PROCEDURE: Informed consent was obtained from the patient following an explanation of the procedure, risks, benefits and alternatives. The patient understands, agrees and consents for the procedure. All questions were addressed. A time out was performed prior to the initiation of the procedure. The patient was positioned prone and non-contrast localization CT was performed of the pelvis to demonstrate the iliac marrow spaces. The operative site was prepped and draped in the usual sterile fashion. Under sterile conditions and local anesthesia, a 22 gauge spinal needle was utilized for procedural planning. Next, an 11 gauge coaxial bone biopsy needle was advanced into the left iliac marrow space. Needle position was  confirmed with CT imaging. Initially, bone marrow aspiration was performed. Next, a bone marrow biopsy was obtained with the 11 gauge outer bone marrow device. Samples were prepared with the cytotechnologist and deemed adequate. The needle was removed intact. Hemostasis was obtained with compression and a dressing was placed. The patient tolerated the procedure well without immediate post procedural complication. IMPRESSION: Successful CT guided left iliac bone marrow aspiration and core biopsy. Electronically Signed   By: Sandi Mariscal M.D.   On: 09/11/2019 09:36   CT BONE MARROW BIOPSY & ASPIRATION  Result Date: 09/11/2019 INDICATION: History of multiple myeloma. Please perform CT-guided bone marrow biopsy for tissue diagnostic purposes. EXAM: CT-GUIDED BONE MARROW BIOPSY AND ASPIRATION MEDICATIONS: None ANESTHESIA/SEDATION: Fentanyl 100 mcg IV; Versed 2 mg IV Sedation Time: 10 Minutes; The patient was continuously monitored during the procedure by the interventional radiology nurse under my direct  supervision. COMPLICATIONS: None immediate. PROCEDURE: Informed consent was obtained from the patient following an explanation of the procedure, risks, benefits and alternatives. The patient understands, agrees and consents for the procedure. All questions were addressed. A time out was performed prior to the initiation of the procedure. The patient was positioned prone and non-contrast localization CT was performed of the pelvis to demonstrate the iliac marrow spaces. The operative site was prepped and draped in the usual sterile fashion. Under sterile conditions and local anesthesia, a 22 gauge spinal needle was utilized for procedural planning. Next, an 11 gauge coaxial bone biopsy needle was advanced into the left iliac marrow space. Needle position was confirmed with CT imaging. Initially, bone marrow aspiration was performed. Next, a bone marrow biopsy was obtained with the 11 gauge outer bone marrow device. Samples were prepared with the cytotechnologist and deemed adequate. The needle was removed intact. Hemostasis was obtained with compression and a dressing was placed. The patient tolerated the procedure well without immediate post procedural complication. IMPRESSION: Successful CT guided left iliac bone marrow aspiration and core biopsy. Electronically Signed   By: Sandi Mariscal M.D.   On: 09/11/2019 09:36     ASSESSMENT & PLAN:  78 y.o. female with  1. Multiple Myeloma with IgG Lambda specificity Labs upon initial presentation from 12/01/18 CBC w/diff revealed HGB at 10.4 with MCV of 105.7. 11/28/18 CMP revealed Creatinine normal at 0.68 and Calcium normal at 8.9. 11/28/18 Beta 2 microglobulin at 2.30m (also a reading at 4.765mon the same day). 11/30/18 24HR UPEP revealed M spike at 75.1%. 11/28/18 SPEP revealed M spike of 4.6g. 11/28/18 Immunoglobulins revealed IgG elevated at 618127m 12/01/18 BM Bx revealed hypercellular bone marrow with 70-80% CD138 immunohistochemistry (44% aspirate)  lambda-restricted plasma cells as well as a kappa restricted population of B-cells 11/27/18 MRI Thoracic Spine which revealed Multifocal marrow signal abnormality consistent with metastatic disease or multiple myeloma. The patient has multiple compression fractures most consistent with pathologic injuries. Extensive marrow signal abnormality makes determining age of the fractures difficult but edema is most intense in T3. 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 2. Areas of hypermetabolic disease identified in both lungs suggesting multiple myeloma involvement. 3. 1.9 cm calcified left thyroid nodule is hypermetabolic, but Indeterminate. 4. Colon is diffusely  distended with gas and stool. Imaging features would be compatible with clinical constipation. 5.  Aortic Atherosclerois.  Pt describes grade II to III rash on her re-challenge from Revlimid with optimized pre-medications, and we discontinued Revlimid  Began Cytoxan with C4 Velcade and Dexamethasone  05/16/2019 M spike is down to 0.2g/dl showing continued improvement and a 90% reduction  07/17/2019 PET/CT Whole Body Scan ([id number]) revealed "1. 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 2. Hypermetabolic calcified left thyroid nodule, as before, indeterminate. 3. Aortic atherosclerosis (ICD10-170.0). Coronary artery calcification."  -07/14/2019 BM Bx revealed BONE MARROW: - Hypercellular marrow with residual plasma cell neoplasm (<10%) PERIPHERAL BLOOD: - Pancytopenia".    09/14/2019 PET/CT ([id number]) revealed "1. No findings of active  malignancy. 2. Numerous thoracolumbar compression fractures many of which may be associated with osteoporosis. 3. Calcified 1.5 cm in diameter left thyroid nodule with persistent elevated activity. A significant minority of hypermetabolic thyroid nodules can be malignant, if not previously worked up to then thyroid ultrasound would be recommended as a next step. 4. There a few small lucent and small sclerotic lesions in the skeleton which are not hypermetabolic, possibilities include benign lesions or previously effectively treated myeloma. 5. Other imaging findings of potential clinical significance: Aortic Atherosclerosis (ICD10-I70.0). Coronary atherosclerosis. Prominent stool throughout the colon favors constipation. Hyperdense right renal lesion is probably a complex cyst but technically nonspecific."  09/11/2019 Bone Marrow Report ([id number]) revealed "BONE MARROW, ASPIRATE, CLOT, CORE:  -Variably cellular bone marrow with trilineage hematopoiesis and 2% plasma cells PERIPHERAL BLOOD:  -Pancytopenia".  09/11/2019 Flow Pathology Report [id number]) revealed "-Predominance of T lymphocytes with relative abundance of CD8 positive cells  -No significant B-cell population identified -Relative abundance of natural killer cells".  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 She does not currently have elevated lymphocyte counts on peripheral blood draws.   PLAN: -Discussed pt labwork today, 09/19/19; all values are WNL except for RBC at 2.73, Hgb at 9.8, HCT at 29.1, MCV at 106.6, MCH at 35.9, Lymphs Abs at 0.1K, Glucose at 109, Calcium at 8.7, Total Protein at 5.9, Alkaline Phosphatase at 34, Total Bilirubin at 0.2. -Discussed 09/19/2019 TSH at 1.006 -Discussed 09/19/2019 T4, free is in progress -Discussed 09/19/2019  Vitamin D 25-hydroxy is in progress -Discussed 09/19/2019 MMP is in progress, 09/05/2019 M Protein is stable at 0.2 g/dL -Discussed 09/14/2019 PET/CT ([id number]) which revealed no active disease in the bones. Incidental findings of multiple compression fractures. Single kidney cysts. -Discussed 09/11/2019 Bone Marrow Report ([id number]) which revealed "2% plasma cells". -Discussed 09/11/2019 Flow Pathology Report [id number]) which revealed "-Predominance of T lymphocytes with relative abundance of CD8 positive cells  -No significant B-cell population identified -Relative abundance of natural killer cells". -No lab or clinical progression of Multiple Myeloma or Monoclonal B-cell Lymphocytosis at this time -Discussed maintenance options again, including Velcade, Ninlaro, and Revlimid -The pt has no prohibitive toxicities from continuing Velcade C14D1 today and C14D15 in 2 weeks.  -Will begin Ninlaro '3mg'$  weeky on D 1,8,15 in 10/2019 -Will switch from [place] to [place]  In 10/2019 -Will get US Thyroid after next visit  -Will refer pt to an Endocrinologist for thyroid nodule based on US Thyroid -Continue 3000IU Vitamin D daily -Will see back in 4 weeks with labs   FOLLOW UP: Changing C14 to maintenance  Velcade C14D1 today and C14D15 in 2 weeks with labs. Planning to switch to [facility] from January 2021. RTC with Dr Irene Limbo in 4 weeks with labs  The total time spent in the appt was 25 minutes and more than 50% was on counseling and direct patient cares.  All of the patient's questions were answered with apparent satisfaction. The patient knows to call the clinic with any problems, questions or concerns.   Sullivan Lone MD [place] AAHIVMS [facility] Hematology/Oncology [facility]  (Office):       [phone] (Work cell):  [phone] (Fax):           [phone]  09/19/2019 4:57 PM  I, Yevette Edwards, am acting as a scribe for Dr. Sullivan Lone.   .I have  reviewed the above documentation for accuracy and completeness, and I agree with the above. [name] MD

## 2019-09-20 ENCOUNTER — Encounter (HOSPITAL_COMMUNITY): Payer: Self-pay | Admitting: Hematology

## 2019-09-20 ENCOUNTER — Telehealth: Payer: Self-pay | Admitting: Hematology

## 2019-09-20 NOTE — Telephone Encounter (Signed)
Scheduled appt per 12/15 los.  Had to verify changes with the MD nurse about the pt treatment.  Spoke with pt and she is aware of the appt dates and tieme

## 2019-09-21 ENCOUNTER — Encounter (HOSPITAL_COMMUNITY): Payer: Self-pay | Admitting: Hematology

## 2019-09-21 LAB — MULTIPLE MYELOMA PANEL, SERUM
Albumin SerPl Elph-Mcnc: 3.6 g/dL (ref 2.9–4.4)
Albumin/Glob SerPl: 1.9 — ABNORMAL HIGH (ref 0.7–1.7)
Alpha 1: 0.3 g/dL (ref 0.0–0.4)
Alpha2 Glob SerPl Elph-Mcnc: 0.6 g/dL (ref 0.4–1.0)
B-Globulin SerPl Elph-Mcnc: 0.8 g/dL (ref 0.7–1.3)
Gamma Glob SerPl Elph-Mcnc: 0.2 g/dL — ABNORMAL LOW (ref 0.4–1.8)
Globulin, Total: 1.9 g/dL — ABNORMAL LOW (ref 2.2–3.9)
IgA: 26 mg/dL — ABNORMAL LOW (ref 64–422)
IgG (Immunoglobin G), Serum: 341 mg/dL — ABNORMAL LOW (ref 586–1602)
IgM (Immunoglobulin M), Srm: 10 mg/dL — ABNORMAL LOW (ref 26–217)
M Protein SerPl Elph-Mcnc: 0.2 g/dL — ABNORMAL HIGH
Total Protein ELP: 5.5 g/dL — ABNORMAL LOW (ref 6.0–8.5)

## 2019-09-22 ENCOUNTER — Inpatient Hospital Stay: Payer: Medicare HMO

## 2019-09-25 MED ORDER — IXAZOMIB CITRATE 3 MG PO CAPS: ORAL_CAPSULE | ORAL | 2 refills | Status: DC

## 2019-09-26 ENCOUNTER — Inpatient Hospital Stay: Payer: Medicare HMO

## 2019-09-26 ENCOUNTER — Other Ambulatory Visit: Payer: Self-pay

## 2019-09-26 ENCOUNTER — Telehealth: Payer: Self-pay | Admitting: Pharmacist

## 2019-09-26 ENCOUNTER — Telehealth: Payer: Self-pay

## 2019-09-26 VITALS — BP 145/79 | HR 65 | Temp 98.3°F | Resp 16

## 2019-09-26 DIAGNOSIS — I10 Essential (primary) hypertension: Secondary | ICD-10-CM | POA: Diagnosis not present

## 2019-09-26 DIAGNOSIS — C9000 Multiple myeloma not having achieved remission: Secondary | ICD-10-CM

## 2019-09-26 DIAGNOSIS — Z7189 Other specified counseling: Secondary | ICD-10-CM

## 2019-09-26 DIAGNOSIS — M8580 Other specified disorders of bone density and structure, unspecified site: Secondary | ICD-10-CM | POA: Diagnosis not present

## 2019-09-26 DIAGNOSIS — M549 Dorsalgia, unspecified: Secondary | ICD-10-CM | POA: Diagnosis not present

## 2019-09-26 DIAGNOSIS — K5900 Constipation, unspecified: Secondary | ICD-10-CM | POA: Diagnosis not present

## 2019-09-26 DIAGNOSIS — R600 Localized edema: Secondary | ICD-10-CM | POA: Diagnosis not present

## 2019-09-26 DIAGNOSIS — R21 Rash and other nonspecific skin eruption: Secondary | ICD-10-CM | POA: Diagnosis not present

## 2019-09-26 DIAGNOSIS — Z5111 Encounter for antineoplastic chemotherapy: Secondary | ICD-10-CM | POA: Diagnosis not present

## 2019-09-26 DIAGNOSIS — D61818 Other pancytopenia: Secondary | ICD-10-CM | POA: Diagnosis not present

## 2019-09-26 DIAGNOSIS — I2510 Atherosclerotic heart disease of native coronary artery without angina pectoris: Secondary | ICD-10-CM | POA: Diagnosis not present

## 2019-09-26 MED ORDER — DENOSUMAB 120 MG/1.7ML ~~LOC~~ SOLN
SUBCUTANEOUS | Status: AC
  Filled 2019-09-26: qty 1.7

## 2019-09-26 MED ORDER — DENOSUMAB 120 MG/1.7ML ~~LOC~~ SOLN
120.0000 mg | Freq: Once | SUBCUTANEOUS | Status: AC
  Administered 2019-09-26: 120 mg via SUBCUTANEOUS

## 2019-09-26 NOTE — Telephone Encounter (Addendum)
Oral Oncology Patient Advocate Encounter  Prior Authorization for Kennieth Rad has been approved.    PA# [id number] Effective dates: 10/05/18 through 10/04/20  Patients co-pay is $553.13  Patient has grant through [facility] which brings copay to $0  [facility] will continue to follow.   Mina Patient [place] Phone [phone] Fax [phone] 09/26/2019 11:42 AM

## 2019-09-26 NOTE — Telephone Encounter (Signed)
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 Planned start in January 2021  CMP from 09/19/2019 assessed, no relevant lab abnormalities. Prescription dose and frequency assessed.   Current medication list in Epic reviewed, no DDIs with ixazomib identified.  Prescription has been e-scribed to the [facility] for benefits analysis and approval.  Oral Oncology Clinic will continue to follow for insurance authorization, copayment issues, initial counseling and start date.  Darl Pikes, PharmD, BCPS, [facility] Hematology/Oncology Clinical Pharmacist [facility] [place] [facility] [phone]  09/26/2019 11:22 AM

## 2019-09-26 NOTE — Telephone Encounter (Signed)
Oral Oncology Patient Advocate Encounter  Received notification from [employer] that prior authorization for Megan Olson is required.  PA submitted on CoverMyMeds Key [id number] Status is pending  Oral Oncology Clinic will continue to follow.  Megan Olson Patient [place] Phone [phone] Fax [phone] 09/26/2019 11:06 AM

## 2019-09-26 NOTE — Patient Instructions (Signed)
Denosumab injection What is this medicine? DENOSUMAB (den oh sue mab) slows bone breakdown. Prolia is used to treat osteoporosis in women after menopause and in men, and in people who are taking corticosteroids for 6 months or more. Xgeva is used to treat a high calcium level due to cancer and to prevent bone fractures and other bone problems caused by multiple myeloma or cancer bone metastases. Xgeva is also used to treat giant cell tumor of the bone. This medicine may be used for other purposes; ask your health care provider or pharmacist if you have questions. COMMON BRAND NAME(S): Prolia, XGEVA What should I tell my health care provider before I take this medicine? They need to know if you have any of these conditions:  dental disease  having surgery or tooth extraction  infection  kidney disease  low levels of calcium or Vitamin D in the blood  malnutrition  on hemodialysis  skin conditions or sensitivity  thyroid or parathyroid disease  an unusual reaction to denosumab, other medicines, foods, dyes, or preservatives  pregnant or trying to get pregnant  breast-feeding How should I use this medicine? This medicine is for injection under the skin. It is given by a health care professional in a hospital or clinic setting. A special MedGuide will be given to you before each treatment. Be sure to read this information carefully each time. For Prolia, talk to your pediatrician regarding the use of this medicine in children. Special care may be needed. For Xgeva, talk to your pediatrician regarding the use of this medicine in children. While this drug may be prescribed for children as young as 13 years for selected conditions, precautions do apply. Overdosage: If you think you have taken too much of this medicine contact a poison control center or emergency room at once. NOTE: This medicine is only for you. Do not share this medicine with others. What if I miss a dose? It is  important not to miss your dose. Call your doctor or health care professional if you are unable to keep an appointment. What may interact with this medicine? Do not take this medicine with any of the following medications:  other medicines containing denosumab This medicine may also interact with the following medications:  medicines that lower your chance of fighting infection  steroid medicines like prednisone or cortisone This list may not describe all possible interactions. Give your health care provider a list of all the medicines, herbs, non-prescription drugs, or dietary supplements you use. Also tell them if you smoke, drink alcohol, or use illegal drugs. Some items may interact with your medicine. What should I watch for while using this medicine? Visit your doctor or health care professional for regular checks on your progress. Your doctor or health care professional may order blood tests and other tests to see how you are doing. Call your doctor or health care professional for advice if you get a fever, chills or sore throat, or other symptoms of a cold or flu. Do not treat yourself. This drug may decrease your body's ability to fight infection. Try to avoid being around people who are sick. You should make sure you get enough calcium and vitamin D while you are taking this medicine, unless your doctor tells you not to. Discuss the foods you eat and the vitamins you take with your health care professional. See your dentist regularly. Brush and floss your teeth as directed. Before you have any dental work done, tell your dentist you are   receiving this medicine. Do not become pregnant while taking this medicine or for 5 months after stopping it. Talk with your doctor or health care professional about your birth control options while taking this medicine. Women should inform their doctor if they wish to become pregnant or think they might be pregnant. There is a potential for serious side  effects to an unborn child. Talk to your health care professional or pharmacist for more information. What side effects may I notice from receiving this medicine? Side effects that you should report to your doctor or health care professional as soon as possible:  allergic reactions like skin rash, itching or hives, swelling of the face, lips, or tongue  bone pain  breathing problems  dizziness  jaw pain, especially after dental work  redness, blistering, peeling of the skin  signs and symptoms of infection like fever or chills; cough; sore throat; pain or trouble passing urine  signs of low calcium like fast heartbeat, muscle cramps or muscle pain; pain, tingling, numbness in the hands or feet; seizures  unusual bleeding or bruising  unusually weak or tired Side effects that usually do not require medical attention (report to your doctor or health care professional if they continue or are bothersome):  constipation  diarrhea  headache  joint pain  loss of appetite  muscle pain  runny nose  tiredness  upset stomach This list may not describe all possible side effects. Call your doctor for medical advice about side effects. You may report side effects to FDA at 1-800-FDA-1088. Where should I keep my medicine? This medicine is only given in a clinic, doctor's office, or other health care setting and will not be stored at home. NOTE: This sheet is a summary. It may not cover all possible information. If you have questions about this medicine, talk to your doctor, pharmacist, or health care provider.  2020 Elsevier/Gold Standard (2018-01-28 16:10:44)

## 2019-09-26 NOTE — Progress Notes (Signed)
Per MD ok to give xgeva with 8.7 calcium.

## 2019-09-28 ENCOUNTER — Ambulatory Visit: Payer: Medicare HMO

## 2019-10-03 NOTE — Telephone Encounter (Signed)
Oral Chemotherapy Pharmacist Encounter  Megan Olson will be shipped by [employer] for delivery on 10/05/2019.  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 Planned start in January 2021  Pt is doing well. Counseled patient on administration, dosing, side effects, monitoring, drug-food interactions, safe handling, storage, and disposal. Patient will take 1 capsule (3 mg) by mouth weekly, 3 weeks on, 1 week off, repeat every 4 weeks. Take on an empty stomach 1hr before or 2hr after meals.  Side effects include but not limited to: decreased wbc/plt, constipation or diarrhea, edema.    Reviewed with patient importance of keeping a medication schedule and plan for any missed doses.  Megan Olson voiced understanding and appreciation. All questions answered. Medication handout placed in the mail.  Provided patient with Oral [facility] [facility] phone number. Patient knows to call the office with questions or concerns. Oral Chemotherapy [facility] will continue to follow.  Darl Pikes, PharmD, BCPS, [facility] Hematology/Oncology Clinical Pharmacist [facility] [place] [facility] [phone]  10/03/2019 4:05 PM

## 2019-10-04 ENCOUNTER — Inpatient Hospital Stay: Payer: Medicare HMO

## 2019-10-04 ENCOUNTER — Other Ambulatory Visit: Payer: Self-pay

## 2019-10-04 VITALS — BP 175/85 | HR 67 | Temp 97.8°F | Resp 16

## 2019-10-04 DIAGNOSIS — I10 Essential (primary) hypertension: Secondary | ICD-10-CM | POA: Diagnosis not present

## 2019-10-04 DIAGNOSIS — R600 Localized edema: Secondary | ICD-10-CM | POA: Diagnosis not present

## 2019-10-04 DIAGNOSIS — R21 Rash and other nonspecific skin eruption: Secondary | ICD-10-CM | POA: Diagnosis not present

## 2019-10-04 DIAGNOSIS — Z5111 Encounter for antineoplastic chemotherapy: Secondary | ICD-10-CM | POA: Diagnosis not present

## 2019-10-04 DIAGNOSIS — C9000 Multiple myeloma not having achieved remission: Secondary | ICD-10-CM

## 2019-10-04 DIAGNOSIS — D61818 Other pancytopenia: Secondary | ICD-10-CM | POA: Diagnosis not present

## 2019-10-04 DIAGNOSIS — M549 Dorsalgia, unspecified: Secondary | ICD-10-CM | POA: Diagnosis not present

## 2019-10-04 DIAGNOSIS — M8580 Other specified disorders of bone density and structure, unspecified site: Secondary | ICD-10-CM | POA: Diagnosis not present

## 2019-10-04 DIAGNOSIS — Z7189 Other specified counseling: Secondary | ICD-10-CM

## 2019-10-04 DIAGNOSIS — I2510 Atherosclerotic heart disease of native coronary artery without angina pectoris: Secondary | ICD-10-CM | POA: Diagnosis not present

## 2019-10-04 DIAGNOSIS — K5900 Constipation, unspecified: Secondary | ICD-10-CM | POA: Diagnosis not present

## 2019-10-04 LAB — CBC WITH DIFFERENTIAL/PLATELET
Abs Immature Granulocytes: 0.01 10*3/uL (ref 0.00–0.07)
Basophils Absolute: 0 10*3/uL (ref 0.0–0.1)
Basophils Relative: 2 %
Eosinophils Absolute: 0 10*3/uL (ref 0.0–0.5)
Eosinophils Relative: 2 %
HCT: 31.2 % — ABNORMAL LOW (ref 36.0–46.0)
Hemoglobin: 10.4 g/dL — ABNORMAL LOW (ref 12.0–15.0)
Immature Granulocytes: 0 %
Lymphocytes Relative: 6 %
Lymphs Abs: 0.2 10*3/uL — ABNORMAL LOW (ref 0.7–4.0)
MCH: 35.1 pg — ABNORMAL HIGH (ref 26.0–34.0)
MCHC: 33.3 g/dL (ref 30.0–36.0)
MCV: 105.4 fL — ABNORMAL HIGH (ref 80.0–100.0)
Monocytes Absolute: 0.4 10*3/uL (ref 0.1–1.0)
Monocytes Relative: 16 %
Neutro Abs: 2.1 10*3/uL (ref 1.7–7.7)
Neutrophils Relative %: 74 %
Platelets: 199 10*3/uL (ref 150–400)
RBC: 2.96 MIL/uL — ABNORMAL LOW (ref 3.87–5.11)
RDW: 14.6 % (ref 11.5–15.5)
WBC: 2.7 10*3/uL — ABNORMAL LOW (ref 4.0–10.5)
nRBC: 0 % (ref 0.0–0.2)

## 2019-10-04 LAB — CMP (CANCER CENTER ONLY)
ALT: 20 U/L (ref 0–44)
AST: 18 U/L (ref 15–41)
Albumin: 4 g/dL (ref 3.5–5.0)
Alkaline Phosphatase: 36 U/L — ABNORMAL LOW (ref 38–126)
Anion gap: 10 (ref 5–15)
BUN: 14 mg/dL (ref 8–23)
CO2: 29 mmol/L (ref 22–32)
Calcium: 8.9 mg/dL (ref 8.9–10.3)
Chloride: 104 mmol/L (ref 98–111)
Creatinine: 0.64 mg/dL (ref 0.44–1.00)
GFR, Est AFR Am: >60 mL/min (ref >60)
GFR, Estimated: >60 mL/min (ref >60)
Glucose, Bld: 87 mg/dL (ref 70–99)
Potassium: 4 mmol/L (ref 3.5–5.1)
Sodium: 143 mmol/L (ref 135–145)
Total Bilirubin: 0.4 mg/dL (ref 0.3–1.2)
Total Protein: 6 g/dL — ABNORMAL LOW (ref 6.5–8.1)

## 2019-10-04 MED ORDER — PROCHLORPERAZINE MALEATE 10 MG PO TABS
10.0000 mg | ORAL_TABLET | Freq: Once | ORAL | Status: AC
  Administered 2019-10-04: 09:00:00 10 mg via ORAL

## 2019-10-04 MED ORDER — PROCHLORPERAZINE MALEATE 10 MG PO TABS
ORAL_TABLET | ORAL | Status: AC
  Filled 2019-10-04: qty 1

## 2019-10-04 MED ORDER — BORTEZOMIB CHEMO SQ INJECTION 3.5 MG (2.5MG/ML)
1.3000 mg/m2 | Freq: Once | INTRAMUSCULAR | Status: AC
  Administered 2019-10-04: 2 mg via SUBCUTANEOUS
  Filled 2019-10-04: qty 0.8

## 2019-10-04 MED FILL — NINLARO 3 MG CAPS: 3 | 28 days supply | Qty: 3 | Fill #0

## 2019-10-04 NOTE — Patient Instructions (Signed)
[facility] Discharge Instructions for Patients Receiving Chemotherapy  Today you received the following chemotherapy agents Velcade.  To help prevent nausea and vomiting after your treatment, we encourage you to take your nausea medication as directed.  If you develop nausea and vomiting that is not controlled by your nausea medication, call the clinic.   BELOW ARE SYMPTOMS THAT SHOULD BE REPORTED IMMEDIATELY:  *FEVER GREATER THAN 100.5 F  *CHILLS WITH OR WITHOUT FEVER  NAUSEA AND VOMITING THAT IS NOT CONTROLLED WITH YOUR NAUSEA MEDICATION  *UNUSUAL SHORTNESS OF BREATH  *UNUSUAL BRUISING OR BLEEDING  TENDERNESS IN MOUTH AND THROAT WITH OR WITHOUT PRESENCE OF ULCERS  *URINARY PROBLEMS  *BOWEL PROBLEMS  UNUSUAL RASH Items with * indicate a potential emergency and should be followed up as soon as possible.  Feel free to call the clinic should you have any questions or concerns. The clinic phone number is [phone].  Please show the CHEMO ALERT CARD at check-in to the Emergency Department and triage nurse.   

## 2019-10-09 ENCOUNTER — Other Ambulatory Visit: Payer: Self-pay | Admitting: *Deleted

## 2019-10-09 ENCOUNTER — Telehealth: Payer: Self-pay | Admitting: *Deleted

## 2019-10-09 MED ORDER — OXYCODONE-ACETAMINOPHEN 5-325 MG PO TABS: 1.0000 | ORAL_TABLET | ORAL | 0 refills | Status: DC | PRN

## 2019-10-09 NOTE — Telephone Encounter (Signed)
Patient called -what day/date does Dr. Irene Limbo want her to begin [facility]? It arrived today. Dr. Irene Limbo informed of question. Per Dr. Irene Limbo - patient should begin [facility] 10/10/19.  Contacted patient with directions to start medication tomorrow. She verbalized understanding.

## 2019-10-09 NOTE — Telephone Encounter (Signed)
Requested refill of oxycodone - refill request sent to Dr.Kale

## 2019-10-16 NOTE — Progress Notes (Signed)
HEMATOLOGY/ONCOLOGY CLINIC NOTE  Date of Service: 10/17/2019  Patient Care Team: [name], MD as PCP - General (Family Medicine) [name], MD as Consulting Physician (Hematology) Bond, [name], MD as Referring Physician (Ophthalmology) [name], [name], MD as Consulting Physician (Hematology and Oncology)  Dr. Ronney Lion as [place] Specialist  [phone]  CHIEF COMPLAINTS/PURPOSE OF CONSULTATION:   Multiple Myeloma- continued management  HISTORY OF PRESENTING ILLNESS:   Megan Olson is a wonderful 79 y.o. female who has been referred to [place] by Dr. 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 She is accompanied today by her husband. The pt reports that she is doing well overall.   The pt notes that she was doing extensive yard work in October 2019, and began to feel back pain a few days after this, which she attributed to muscle pain. She recalls taking a deep breath, and developing sudden acute pain, and presented to care with her PCP's office. She began exercises for her back pain, without relief, then began PT without relief, then was referred to Dr. Paulla Fore in sports medicine in late January 2020. She had an XR which revealed a compression fracture at T11, then subsequently had an MRI, and a bone marrow biopsy. The pt notes that her back pain "seemed to move around." She endorses pain "up the whole left side" of her back.  The pt notes that she is not able to stand up straight due to her back pain, which she feels limits her ability to take a deep breath, and endorses pain exacerbation when she does take a deep breath. The pt needs assistance from sitting to lying from her husband. She is using about 3 Percocet a day.  The pt notes worsened constipation since beginning Percocet, and notes that her most recent laxative order was not covered by her insurance. She is using prune juice and milk  of magnesia. She took Senokot S BID without relief.  The pt reports that prior to her recent back pain, she had very few medical concerns. She endorses controlled BP, and has been monitored for DM but after closely watching her diet her A1C decreased to 5.8. She has glaucoma, and has had surgery in both eyes. Right eye with stent. The pt sees Dr. Edilia Bo at [facility] for her eye care. The pt notes that her vision has been recently "pretty good." The pt notes that she has been advised to limit treatment with steroids for her glaucoma. She denies heart or lung problems. Denies previous back problems. Last DEXA scan 3 years ago, and endorses osteopenia. She notes that she took Fosamax for 3-4 years, and stopped 5-6 years ago. She takes Vitamin D, a multivitamin, and magnesium.  The pt notes that she quit smoking cigarettes when she was 27, started when she was about 20. The pt consumes alcohol rarely, but not since beginning narcotics. 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  Of note prior to the patient's visit today, pt has had an MRI Thoracic Spine completed on 11/27/18 with results revealing Multifocal marrow signal abnormality consistent with metastatic disease or multiple myeloma. The patient has multiple compression fractures most consistent with pathologic injuries. Extensive marrow signal abnormality makes determining age of the fractures difficult but edema is most intense in T3. 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  Most recent  lab results (12/01/18) of CBC w/diff is as follows: all values are WNL except for RBC at 3.17, HGB at 10.4, HCT at 33.5, MCV at 105.7. 11/28/18 CMP revealed all values WNL except for Glucose at 105, Total Protein at 10.2, Albumin at 3.1 11/30/18 24HR UPEP revealed all values WNL except for M-spike at 75.1%. 11/28/18 Bega-2 microglobulin slightly elevated at 2.6 11/28/18 SPEP revealed  M spike at 4.6g 11/28/18 Immunoglobulins revealed IgG at 6181, IgA at 24, IgM at 16, and IgE at 6.  On review of systems, pt reports constipation, back pain, stable energy levels, ankle swelling, tenderness at T3, lower back pain, and denies abdominal pains, neck pain, and any other symptoms.   On PMHx the pt reports redundant colon, single hemorrhoid, glaucoma, HTN, osteopenia, Tonsillectomy, right eye stent and multiple eye surgeries. On Social Hx the pt reports rare alcohol use, smoked cigarettes between ages 29 and 50. 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 On Family Hx the pt reports sister died from small cell lung cancer and was a lifelong smoker, brother's daughter died of breast cancer with BRCA1 and BRCA2 mutations. Cousin with female breast cancer.   INTERVAL HISTORY:   Megan Olson returns today for management and evaluation of her multiple myeloma. The patient's last visit with [place] was on 09/19/2019. The pt reports that she is doing well overall.  The pt reports that she has scheduled an appointment to receive the COVID19 vaccine before the end of the month. She took her second dose of Ninlaro this morning and denies any issues with the first dose. Pt has noticed some intermittent tingling in her right arm, as well as an increase in her BP. Pt notices the tingling from her shoulder down to her elbow and does not notice this tingling in her right hand. She has had some previous tightness in her lower neck but does not have any localized pain in the area. She has also noticed some swelling in her left leg. Pt would like to move forward with her dental cleaning, has already received her prophylactic antibiotic and will be prepared to take it if necessary. Pt is interested in doing low-weight resistance exercises. She and her husband have continued to take socially-distanced walks around their neighborhood. She has noticed a slight decrease in energy, which she can not contribute to anything  specific.   Lab results today (10/17/19) of CBC w/diff and CMP is as follows: all values are WNL except for RBC at 3.27, Hgb at 11.4, HCT at 34.8, MCV at 106.4, MCH at 34.9, Lymphs Abs at 0.2K, Glucose at 152, Alkaline Phosphatase at 36.   On review of systems, pt reports left leg swelling, right arm tingilng, fatigue and denies diarrhea, nausea, tingling/numbness in her hands/feet, mouth sores, skin rashes, abdominal pain, bowel movement issues, left calf pain, neck pain and any other symptoms.    MEDICAL HISTORY:  Past Medical History:  Diagnosis Date  . Cancer ([place])    multiple myeloma  . Glaucoma   . HYPERTENSION 03/11/2007  . OSTEOPENIA 03/11/2007    SURGICAL HISTORY: Past Surgical History:  Procedure Laterality Date  . BREAST EXCISIONAL BIOPSY Right 2000  . BREAST LUMPECTOMY  1990   benign  . DILATION AND CURETTAGE OF UTERUS     bleeding at menopause. No uterine cancer  . IR RADIOLOGIST EVAL & MGMT  12/13/2018  . TONSILLECTOMY     age 25    SOCIAL HISTORY: Social History   Socioeconomic History  . Marital  status: Married    Spouse name: Not on file  . Number of children: Not on file  . Years of education: Not on file  . Highest education level: Not on file  Occupational History  . Not on file  Tobacco Use  . Smoking status: Former Smoker    Packs/day: 0.50    Years: 7.00    Pack years: 3.50    Types: Cigarettes    Quit date: 01/04/1961    Years since quitting: 58.8  . Smokeless tobacco: Never Used  Substance and Sexual Activity  . Alcohol use: Yes    Alcohol/week: 1.0 standard drinks    Types: 1 Standard drinks or equivalent per week  . Drug use: No  . Sexual activity: Not Currently  Other Topics Concern  . Not on file  Social History Narrative   Married. Lives with husband (patient of Dr. Yong Channel). 1 son. No grandkids. 1 granddog.       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 Difficulty of Paying Living Expenses: Not on file  Food Insecurity:   . Worried About Charity fundraiser in the Last Year: Not on file  . Ran Out of Food in the Last Year: Not on file  Transportation Needs:   . Lack of Transportation (Medical): Not on file  . Lack of Transportation (Non-Medical): Not on file  Physical Activity:   . Days of Exercise per Week: Not on file  . Minutes of Exercise per Session: Not on file  Stress:   . Feeling of Stress : Not on file  Social Connections:   . Frequency of Communication with Friends and Family: Not on file  . Frequency of Social Gatherings with Friends and Family: Not on file  . Attends Religious Services: Not on file  . Active Member of Clubs or Organizations: Not on file  . Attends Archivist Meetings: Not on file  . Marital Status: Not on file  Intimate Partner Violence:   . Fear of Current or Ex-Partner: Not on file  . Emotionally Abused: Not on file  . Physically Abused: Not on file  . Sexually Abused: Not on file    FAMILY HISTORY: Family History  Problem Relation Age of Onset  . Heart disease Mother        CHF mother died 45  . Arthritis Mother   . Glaucoma Mother        sister as well  . Alcohol abuse Father   . Suicidality Father   . Cancer Sister        lung cancer, smoker  . Heart disease Sister        aortic valve replacement  . Hyperlipidemia Brother   . Hypertension Brother   . COPD Brother   . Arthritis Sister   . Hypertension Sister   . Glaucoma Sister   . Hashimoto's thyroiditis Sister   . Hypertension Son   . Stroke Maternal Grandmother   . Colon cancer Neg Hx   . Colon polyps Neg Hx     ALLERGIES:  is allergic to ace inhibitors; benadryl [diphenhydramine]; diamox [acetazolamide]; sulfamethoxazole; lenalidomide; and penicillins.   MEDICATIONS:  Current Outpatient Medications    Medication Sig Dispense Refill  . acyclovir (ZOVIRAX) 200 MG capsule Take 200 mg by  mouth 2 (two) times daily.    . ALPHAGAN P 0.1 % SOLN Apply 1 drop topically See admin instructions. Apply 2 drops in right eye 3 times a day    . amLODipine (NORVASC) 5 MG tablet Take 1 tablet (5 mg total) by mouth daily. 90 tablet 3  . aspirin EC 81 MG tablet Take 81 mg by mouth daily.    . bimatoprost (LUMIGAN) 0.03 % ophthalmic solution Place 1 drop into the right eye at bedtime.     . Cholecalciferol (VITAMIN D3) 1000 UNITS CAPS Take 3,000 Units by mouth daily.     Marland Kitchen co-enzyme Q-10 50 MG capsule Take 50 mg by mouth daily.      . dorzolamide-timolol (COSOPT) 22.3-6.8 MG/ML ophthalmic solution Place 2 drops into both eyes 2 (two) times daily.   11  . famotidine (PEPCID) 10 MG tablet Take 10 mg by mouth 2 (two) times daily.    . fentaNYL (DURAGESIC) 25 MCG/HR Place 1 patch onto the skin every 3 (three) days. 10 patch 0  . hydrOXYzine (ATARAX/VISTARIL) 25 MG tablet Take 1 tablet (25 mg total) by mouth 3 (three) times daily as needed. 30 tablet 0  . ixazomib citrate (NINLARO) 3 MG capsule Take 1 capsule (3 mg) by mouth weekly, 3 weeks on, 1 week off, repeat every 4 weeks. Take on an empty stomach 1hr before or 2hr after meals. 3 capsule 2  . magnesium hydroxide (MILK OF MAGNESIA) 400 MG/5ML suspension Take 15 mLs by mouth daily as needed for mild constipation or moderate constipation. 355 mL 0  . Magnesium Oxide 500 MG TABS Take 1 tablet by mouth daily.      . Multiple Vitamins-Minerals (MULTIVITAMIN WITH MINERALS) tablet Take 1 tablet by mouth daily.      Marland Kitchen oxyCODONE-acetaminophen (PERCOCET) 5-325 MG tablet Take 1-2 tablets by mouth every 4 (four) hours as needed for moderate pain or severe pain. 90 tablet 0  . polyethylene glycol (MIRALAX) packet Take 17 g by mouth daily. 30 each 1  . senna-docusate (SENOKOT-S) 8.6-50 MG tablet Take 2 tablets by mouth at bedtime. 60 tablet 2  . vitamin C (ASCORBIC ACID) 500 MG  tablet Take 500 mg by mouth daily.     No current facility-administered medications for this visit.    REVIEW OF SYSTEMS:   A 10+ POINT REVIEW OF SYSTEMS WAS OBTAINED including neurology, dermatology, psychiatry, cardiac, respiratory, lymph, extremities, GI, GU, Musculoskeletal, constitutional, breasts, reproductive, HEENT.  All pertinent positives are noted in the HPI.  All others are negative.   PHYSICAL EXAMINATION: ECOG PERFORMANCE STATUS: 1-2  Vitals:   10/17/19 0851  BP: (!) 183/90  Pulse: 73  Resp: 18  Temp: 98.3 F (36.8 C)  SpO2: 97%   Filed Weights   10/17/19 0851  Weight: 106 lb 11.2 oz (48.4 kg)   Body mass index is 19.2 kg/m.   Exam was given in a wheel chair   GENERAL:alert, in no acute distress and comfortable SKIN: no acute rashes, no significant lesions EYES: conjunctiva are pink and non-injected, sclera anicteric OROPHARYNX: MMM, no exudates, no oropharyngeal erythema or ulceration NECK: supple, no JVD LYMPH:  no palpable lymphadenopathy in the cervical, axillary or inguinal regions LUNGS: clear to auscultation b/l with normal respiratory effort HEART: regular rate & rhythm ABDOMEN:  normoactive bowel sounds , non tender, not distended. No palpable hepatosplenomegaly.  Extremity: no pedal edema PSYCH: alert & oriented x 3 with fluent speech NEURO: no focal motor/sensory deficits  LABORATORY DATA:  I have reviewed the data as listed  . CBC Latest Ref Rng & Units 10/17/2019 10/04/2019 09/19/2019  WBC 4.0 - 10.5 K/uL 4.6 2.7(L) 5.3  Hemoglobin 12.0 - 15.0 g/dL 11.4(L) 10.4(L) 9.8(L)  Hematocrit 36.0 - 46.0 % 34.8(L) 31.2(L) 29.1(L)  Platelets 150 - 400 K/uL 225 199 265    . CMP Latest Ref Rng & Units 10/17/2019 10/04/2019 09/19/2019  Glucose 70 - 99 mg/dL 152(H) 87 109(H)  BUN 8 - 23 mg/dL _0 Creatinine 0.44 - 1.00 mg/dL 0.71 0.64 0.64  Sodium 135 - 145 mmol/L 142 143 138  Potassium 3.5 - 5.1 mmol/L 4.0 4.0 4.2  Chloride 98 - 111 mmol/L  104 104 102  CO2 22 - 32 mmol/L _1 Calcium 8.9 - 10.3 mg/dL 9.0 8.9 8.7(L)  Total Protein 6.5 - 8.1 g/dL 6.5 6.0(L) 5.9(L)  Total Bilirubin 0.3 - 1.2 mg/dL 0.4 0.4 0.2(L)  Alkaline Phos 38 - 126 U/L 36(L) 36(L) 34(L)  AST 15 - 41 U/L _2 ALT 0 - 44 U/L _3 09/11/2019 Flow Pathology Report [id number]):   09/11/2019 Bone Marrow Report [id number]):    07/14/2019 Cytogenetics ([id number]):    07/14/2019 Flow Pathology Report ([id number]):   07/14/2019 BM Bx ([id number]):   Most recent MMP 04/25/2019 Component     Latest Ref Rng & Units 04/25/2019  IgG (Immunoglobin G), Serum     586 - 1,602 mg/dL 702  IgA     64 - 422 mg/dL 39 (L)  IgM (Immunoglobulin M), Srm     26 - 217 mg/dL 26  Total Protein ELP     6.0 - 8.5 g/dL 5.9 (L)  Albumin SerPl Elph-Mcnc     2.9 - 4.4 g/dL 3.8  Alpha 1     0.0 - 0.4 g/dL 0.2  Alpha2 Glob SerPl Elph-Mcnc     0.4 - 1.0 g/dL 0.7  B-Globulin SerPl Elph-Mcnc     0.7 - 1.3 g/dL 1.0  Gamma Glob SerPl Elph-Mcnc     0.4 - 1.8 g/dL 0.3 (L)  M Protein SerPl Elph-Mcnc     Not Observed g/dL 0.4 (H)  Globulin, Total     2.2 - 3.9 g/dL 2.1 (L)  Albumin/Glob SerPl     0.7 - 1.7 1.9 (H)  IFE 1      Comment  Please Note (HCV):      Comment    12/01/18 BM Bx:   12/01/18 Flow Cytometry:       RADIOGRAPHIC STUDIES: I have personally reviewed the radiological images as listed and agreed with the findings in the report. No results found.   ASSESSMENT & PLAN:  79 y.o. female with  1. Multiple Myeloma with IgG Lambda specificity Labs upon initial presentation from 12/01/18 CBC w/diff revealed HGB at 10.4 with MCV of 105.7. 11/28/18 CMP revealed Creatinine normal at 0.68 and Calcium normal at 8.9. 11/28/18 Beta 2 microglobulin at 2.45m (also a reading at 4.751mon the same day). 11/30/18 24HR UPEP revealed M spike at 75.1%. 11/28/18 SPEP revealed M spike of 4.6g. 11/28/18 Immunoglobulins revealed IgG elevated at  618131m 12/01/18 BM Bx revealed hypercellular bone marrow with 70-80% CD138 immunohistochemistry (44% aspirate) lambda-restricted plasma cells as well as a kappa restricted population of B-cells 11/27/18 MRI Thoracic Spine which revealed Multifocal marrow signal abnormality consistent with metastatic disease or multiple myeloma. The patient has multiple compression fractures most consistent with pathologic injuries. Extensive marrow signal abnormality makes  determining age of the fractures difficult but edema is most intense in T3. 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 2. Areas of hypermetabolic disease identified in both lungs suggesting multiple myeloma involvement. 3. 1.9 cm calcified left thyroid nodule is hypermetabolic, but Indeterminate. 4. Colon is diffusely distended with gas and stool. Imaging features would be compatible with clinical constipation. 5.  Aortic Atherosclerois.  Pt describes grade II to III rash on her re-challenge from Revlimid with optimized pre-medications, and we discontinued Revlimid  Began Cytoxan with C4 Velcade and Dexamethasone  05/16/2019 M spike is down to 0.2g/dl showing continued improvement and a 90% reduction  -07/14/2019 BM Bx revealed BONE MARROW: - Hypercellular marrow with residual plasma cell neoplasm (<10%) PERIPHERAL BLOOD: - Pancytopenia".   07/17/2019 PET/CT Whole Body Scan ([id number]) revealed "1. 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 2. Hypermetabolic calcified left thyroid nodule, as before, indeterminate. 3. Aortic atherosclerosis  (ICD10-170.0). Coronary artery calcification."  09/11/2019 Bone Marrow Report ([id number]) revealed "BONE MARROW, ASPIRATE, CLOT, CORE:  -Variably cellular bone marrow with trilineage hematopoiesis and 2% plasma cells PERIPHERAL BLOOD:  -Pancytopenia".  09/11/2019 Flow Pathology Report [id number]) revealed "-Predominance of T lymphocytes with relative abundance of CD8 positive cells  -No significant B-cell population identified -Relative abundance of natural killer cells".  09/14/2019 PET/CT ([id number]) revealed "1. No findings of active malignancy. 2. Numerous thoracolumbar compression fractures many of which may be associated with osteoporosis. 3. Calcified 1.5 cm in diameter left thyroid nodule with persistent elevated activity. A significant minority of hypermetabolic thyroid nodules can be malignant, if not previously worked up to then thyroid ultrasound would be recommended as a next step. 4. There a few small lucent and small sclerotic lesions in the skeleton which are not hypermetabolic, possibilities include benign lesions or previously effectively treated myeloma. 5. Other imaging findings of potential clinical significance: Aortic Atherosclerosis (ICD10-I70.0). Coronary atherosclerosis. Prominent stool throughout the colon favors constipation. Hyperdense right renal lesion is probably a complex cyst but technically nonspecific."  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 She does not currently have elevated lymphocyte counts on peripheral blood draws.   PLAN: -Discussed pt labwork today, 10/17/19; Hgb has improved, WBC are nml, PLT look good, other blood counts are good, blood chemistries are stable -M protein is stable at 0.2 g/dL -No lab or clinical progression of Multiple Myeloma or Monoclonal B-cell  Lymphocytosis at this time  -The pt has no prohibitive toxicities from continuing Ninlaro at this time -Recommended pt monitor BP in the morning, before activity or coffee. Reduce sodium intake.  -Recommended pt receive COVID19 vaccine (both doses) before dental cleaning -Advised pt that if any other dental procedures need to be done we would need to hold Zometa for a few months before and after -Recommended pt monitor her posture to help prevent nerve pain  -Recommended pt keep resistance under 5 lbs when working out, utilize slow-controlled movements -Begin Zometa q8 weeks in 3 weeks -Continue 3000IU Vitamin D daily -Refill  Fentanyl Patch  -Will see back in 3 weeks with labs   FOLLOW UP: Cancel all Velcade appointments (patient has been switched to Ninlaro) Switching Xgeva to Zometa -- 1st dose of Zometa in 3 weeks RTC with Dr. Irene Limbo with labs, Zometa infusion and MD visit in 3 weeks   The total time spent in the appt was 30 minutes and more than 50% was on counseling and direct patient cares.  All of the patient's questions were answered with apparent satisfaction. The patient knows to call the clinic with any problems, questions or concerns.    Sullivan Lone MD [place] AAHIVMS [facility] Hematology/Oncology Physician [facility]  (Office):       [phone] (Work cell):  [phone] (Fax):           [phone]  10/17/2019 10:01 AM  I, Yevette Edwards, am acting as a scribe for Dr. Sullivan Lone.   .I have reviewed the above documentation for accuracy and completeness, and I agree with the above. [name] MD

## 2019-10-17 ENCOUNTER — Inpatient Hospital Stay: Payer: Medicare HMO

## 2019-10-17 ENCOUNTER — Other Ambulatory Visit: Payer: Self-pay

## 2019-10-17 ENCOUNTER — Inpatient Hospital Stay: Payer: Medicare HMO | Admitting: Hematology

## 2019-10-17 ENCOUNTER — Inpatient Hospital Stay: Payer: Medicare HMO | Attending: Hematology

## 2019-10-17 VITALS — BP 183/90 | HR 73 | Temp 98.3°F | Resp 18 | Ht 62.5 in | Wt 106.7 lb

## 2019-10-17 DIAGNOSIS — Z87891 Personal history of nicotine dependence: Secondary | ICD-10-CM | POA: Insufficient documentation

## 2019-10-17 DIAGNOSIS — Z5111 Encounter for antineoplastic chemotherapy: Secondary | ICD-10-CM | POA: Diagnosis present

## 2019-10-17 DIAGNOSIS — K5900 Constipation, unspecified: Secondary | ICD-10-CM | POA: Insufficient documentation

## 2019-10-17 DIAGNOSIS — C9000 Multiple myeloma not having achieved remission: Secondary | ICD-10-CM | POA: Diagnosis not present

## 2019-10-17 DIAGNOSIS — G893 Neoplasm related pain (acute) (chronic): Secondary | ICD-10-CM

## 2019-10-17 DIAGNOSIS — M549 Dorsalgia, unspecified: Secondary | ICD-10-CM | POA: Diagnosis not present

## 2019-10-17 DIAGNOSIS — Z79899 Other long term (current) drug therapy: Secondary | ICD-10-CM | POA: Insufficient documentation

## 2019-10-17 LAB — CMP (CANCER CENTER ONLY)
ALT: 18 U/L (ref 0–44)
AST: 15 U/L (ref 15–41)
Albumin: 4.2 g/dL (ref 3.5–5.0)
Alkaline Phosphatase: 36 U/L — ABNORMAL LOW (ref 38–126)
Anion gap: 11 (ref 5–15)
BUN: 13 mg/dL (ref 8–23)
CO2: 27 mmol/L (ref 22–32)
Calcium: 9 mg/dL (ref 8.9–10.3)
Chloride: 104 mmol/L (ref 98–111)
Creatinine: 0.71 mg/dL (ref 0.44–1.00)
GFR, Est AFR Am: >60 mL/min (ref >60)
GFR, Estimated: >60 mL/min (ref >60)
Glucose, Bld: 152 mg/dL — ABNORMAL HIGH (ref 70–99)
Potassium: 4 mmol/L (ref 3.5–5.1)
Sodium: 142 mmol/L (ref 135–145)
Total Bilirubin: 0.4 mg/dL (ref 0.3–1.2)
Total Protein: 6.5 g/dL (ref 6.5–8.1)

## 2019-10-17 LAB — CBC WITH DIFFERENTIAL/PLATELET
Abs Immature Granulocytes: 0.01 10*3/uL (ref 0.00–0.07)
Basophils Absolute: 0 10*3/uL (ref 0.0–0.1)
Basophils Relative: 1 %
Eosinophils Absolute: 0.1 10*3/uL (ref 0.0–0.5)
Eosinophils Relative: 2 %
HCT: 34.8 % — ABNORMAL LOW (ref 36.0–46.0)
Hemoglobin: 11.4 g/dL — ABNORMAL LOW (ref 12.0–15.0)
Immature Granulocytes: 0 %
Lymphocytes Relative: 5 %
Lymphs Abs: 0.2 10*3/uL — ABNORMAL LOW (ref 0.7–4.0)
MCH: 34.9 pg — ABNORMAL HIGH (ref 26.0–34.0)
MCHC: 32.8 g/dL (ref 30.0–36.0)
MCV: 106.4 fL — ABNORMAL HIGH (ref 80.0–100.0)
Monocytes Absolute: 0.5 10*3/uL (ref 0.1–1.0)
Monocytes Relative: 10 %
Neutro Abs: 3.8 10*3/uL (ref 1.7–7.7)
Neutrophils Relative %: 82 %
Platelets: 225 10*3/uL (ref 150–400)
RBC: 3.27 MIL/uL — ABNORMAL LOW (ref 3.87–5.11)
RDW: 13.9 % (ref 11.5–15.5)
WBC: 4.6 10*3/uL (ref 4.0–10.5)
nRBC: 0 % (ref 0.0–0.2)

## 2019-10-17 MED ORDER — FENTANYL 25 MCG/HR TD PT72: 1.0000 | MEDICATED_PATCH | TRANSDERMAL | 0 refills | Status: DC

## 2019-10-18 ENCOUNTER — Telehealth: Payer: Self-pay | Admitting: Hematology

## 2019-10-18 NOTE — Telephone Encounter (Signed)
Scheduled appt per 1/12 los.  Sent a message to HIM pool to get a calendar mailed out. 

## 2019-10-26 ENCOUNTER — Telehealth: Payer: Self-pay

## 2019-10-26 NOTE — Telephone Encounter (Signed)
Oral Oncology Patient Advocate Encounter  Was successful in securing patient a $11000 grant from [employer] to provide copayment coverage for [employer].  This will keep the out of pocket expense at $0.     [employer] ID: [id number]   I have spoken with the patient.   The billing information is as follows and has been shared with [place].      RxBin: [id number] PCN: [id number] Member ID: [id number] Group ID: [id number] Dates of Eligibility: 11/15/19 through 11/13/20  [place] Patient Pender Phone [phone] Fax [phone] 10/26/2019 11:38 AM

## 2019-11-01 ENCOUNTER — Other Ambulatory Visit: Payer: Self-pay | Admitting: *Deleted

## 2019-11-01 MED ORDER — OXYCODONE-ACETAMINOPHEN 5-325 MG PO TABS: 1.0000 | ORAL_TABLET | ORAL | 0 refills | Status: DC | PRN

## 2019-11-02 MED FILL — NINLARO 3 MG CAPS: 3 | 28 days supply | Qty: 3 | Fill #1

## 2019-11-04 ENCOUNTER — Ambulatory Visit: Payer: Medicare HMO

## 2019-11-08 ENCOUNTER — Inpatient Hospital Stay: Payer: Medicare HMO | Admitting: Hematology

## 2019-11-08 ENCOUNTER — Other Ambulatory Visit: Payer: Self-pay

## 2019-11-08 ENCOUNTER — Inpatient Hospital Stay: Payer: Medicare HMO

## 2019-11-08 ENCOUNTER — Inpatient Hospital Stay: Payer: Medicare HMO | Attending: Hematology

## 2019-11-08 VITALS — BP 181/89 | HR 67 | Temp 98.3°F | Resp 18 | Ht 62.5 in | Wt 106.3 lb

## 2019-11-08 DIAGNOSIS — K5900 Constipation, unspecified: Secondary | ICD-10-CM | POA: Insufficient documentation

## 2019-11-08 DIAGNOSIS — M8580 Other specified disorders of bone density and structure, unspecified site: Secondary | ICD-10-CM | POA: Insufficient documentation

## 2019-11-08 DIAGNOSIS — Z87891 Personal history of nicotine dependence: Secondary | ICD-10-CM | POA: Insufficient documentation

## 2019-11-08 DIAGNOSIS — M549 Dorsalgia, unspecified: Secondary | ICD-10-CM | POA: Insufficient documentation

## 2019-11-08 DIAGNOSIS — Z5111 Encounter for antineoplastic chemotherapy: Secondary | ICD-10-CM | POA: Diagnosis not present

## 2019-11-08 DIAGNOSIS — C9000 Multiple myeloma not having achieved remission: Secondary | ICD-10-CM

## 2019-11-08 DIAGNOSIS — Z7189 Other specified counseling: Secondary | ICD-10-CM

## 2019-11-08 DIAGNOSIS — G893 Neoplasm related pain (acute) (chronic): Secondary | ICD-10-CM

## 2019-11-08 DIAGNOSIS — E119 Type 2 diabetes mellitus without complications: Secondary | ICD-10-CM | POA: Diagnosis not present

## 2019-11-08 DIAGNOSIS — I10 Essential (primary) hypertension: Secondary | ICD-10-CM | POA: Insufficient documentation

## 2019-11-08 DIAGNOSIS — Z79899 Other long term (current) drug therapy: Secondary | ICD-10-CM | POA: Insufficient documentation

## 2019-11-08 LAB — CMP (CANCER CENTER ONLY)
ALT: 15 U/L (ref 0–44)
AST: 15 U/L (ref 15–41)
Albumin: 4.3 g/dL (ref 3.5–5.0)
Alkaline Phosphatase: 37 U/L — ABNORMAL LOW (ref 38–126)
Anion gap: 7 (ref 5–15)
BUN: 15 mg/dL (ref 8–23)
CO2: 29 mmol/L (ref 22–32)
Calcium: 9.3 mg/dL (ref 8.9–10.3)
Chloride: 106 mmol/L (ref 98–111)
Creatinine: 0.69 mg/dL (ref 0.44–1.00)
GFR, Est AFR Am: >60 mL/min (ref >60)
GFR, Estimated: >60 mL/min (ref >60)
Glucose, Bld: 100 mg/dL — ABNORMAL HIGH (ref 70–99)
Potassium: 4.2 mmol/L (ref 3.5–5.1)
Sodium: 142 mmol/L (ref 135–145)
Total Bilirubin: 0.4 mg/dL (ref 0.3–1.2)
Total Protein: 6.6 g/dL (ref 6.5–8.1)

## 2019-11-08 LAB — CBC WITH DIFFERENTIAL/PLATELET
Abs Immature Granulocytes: 0 10*3/uL (ref 0.00–0.07)
Basophils Absolute: 0 10*3/uL (ref 0.0–0.1)
Basophils Relative: 1 %
Eosinophils Absolute: 0.1 10*3/uL (ref 0.0–0.5)
Eosinophils Relative: 2 %
HCT: 37.2 % (ref 36.0–46.0)
Hemoglobin: 12.3 g/dL (ref 12.0–15.0)
Immature Granulocytes: 0 %
Lymphocytes Relative: 6 %
Lymphs Abs: 0.2 10*3/uL — ABNORMAL LOW (ref 0.7–4.0)
MCH: 34.6 pg — ABNORMAL HIGH (ref 26.0–34.0)
MCHC: 33.1 g/dL (ref 30.0–36.0)
MCV: 104.8 fL — ABNORMAL HIGH (ref 80.0–100.0)
Monocytes Absolute: 0.4 10*3/uL (ref 0.1–1.0)
Monocytes Relative: 12 %
Neutro Abs: 2.7 10*3/uL (ref 1.7–7.7)
Neutrophils Relative %: 79 %
Platelets: 199 10*3/uL (ref 150–400)
RBC: 3.55 MIL/uL — ABNORMAL LOW (ref 3.87–5.11)
RDW: 13.2 % (ref 11.5–15.5)
WBC: 3.4 10*3/uL — ABNORMAL LOW (ref 4.0–10.5)
nRBC: 0 % (ref 0.0–0.2)

## 2019-11-08 MED ORDER — SODIUM CHLORIDE 0.9 % IV SOLN
Freq: Once | INTRAVENOUS | Status: AC
  Filled 2019-11-08: qty 250

## 2019-11-08 MED ORDER — ZOLEDRONIC ACID 4 MG/100ML IV SOLN
INTRAVENOUS | Status: AC
  Filled 2019-11-08: qty 100

## 2019-11-08 MED ORDER — ZOLEDRONIC ACID 4 MG/100ML IV SOLN
4.0000 mg | Freq: Once | INTRAVENOUS | Status: AC
  Administered 2019-11-08: 4 mg via INTRAVENOUS

## 2019-11-08 NOTE — Patient Instructions (Signed)
Zoledronic Acid injection (Hypercalcemia, Oncology) What is this medicine? ZOLEDRONIC ACID (ZOE le dron ik AS id) lowers the amount of calcium loss from bone. It is used to treat too much calcium in your blood from cancer. It is also used to prevent complications of cancer that has spread to the bone. This medicine may be used for other purposes; ask your health care provider or pharmacist if you have questions. COMMON BRAND NAME(S): Zometa What should I tell my health care provider before I take this medicine? They need to know if you have any of these conditions:  aspirin-sensitive asthma  cancer, especially if you are receiving medicines used to treat cancer  dental disease or wear dentures  infection  kidney disease  receiving corticosteroids like dexamethasone or prednisone  an unusual or allergic reaction to zoledronic acid, other medicines, foods, dyes, or preservatives  pregnant or trying to get pregnant  breast-feeding How should I use this medicine? This medicine is for infusion into a vein. It is given by a health care professional in a hospital or clinic setting. Talk to your pediatrician regarding the use of this medicine in children. Special care may be needed. Overdosage: If you think you have taken too much of this medicine contact a poison control center or emergency room at once. NOTE: This medicine is only for you. Do not share this medicine with others. What if I miss a dose? It is important not to miss your dose. Call your doctor or health care professional if you are unable to keep an appointment. What may interact with this medicine?  certain antibiotics given by injection  NSAIDs, medicines for pain and inflammation, like ibuprofen or naproxen  some diuretics like bumetanide, furosemide  teriparatide  thalidomide This list may not describe all possible interactions. Give your health care provider a list of all the medicines, herbs, non-prescription  drugs, or dietary supplements you use. Also tell them if you smoke, drink alcohol, or use illegal drugs. Some items may interact with your medicine. What should I watch for while using this medicine? Visit your doctor or health care professional for regular checkups. It may be some time before you see the benefit from this medicine. Do not stop taking your medicine unless your doctor tells you to. Your doctor may order blood tests or other tests to see how you are doing. Women should inform their doctor if they wish to become pregnant or think they might be pregnant. There is a potential for serious side effects to an unborn child. Talk to your health care professional or pharmacist for more information. You should make sure that you get enough calcium and vitamin D while you are taking this medicine. Discuss the foods you eat and the vitamins you take with your health care professional. Some people who take this medicine have severe bone, joint, and/or muscle pain. This medicine may also increase your risk for jaw problems or a broken thigh bone. Tell your doctor right away if you have severe pain in your jaw, bones, joints, or muscles. Tell your doctor if you have any pain that does not go away or that gets worse. Tell your dentist and dental surgeon that you are taking this medicine. You should not have major dental surgery while on this medicine. See your dentist to have a dental exam and fix any dental problems before starting this medicine. Take good care of your teeth while on this medicine. Make sure you see your dentist for regular follow-up   appointments. What side effects may I notice from receiving this medicine? Side effects that you should report to your doctor or health care professional as soon as possible:  allergic reactions like skin rash, itching or hives, swelling of the face, lips, or tongue  anxiety, confusion, or depression  breathing problems  changes in vision  eye  pain  feeling faint or lightheaded, falls  jaw pain, especially after dental work  mouth sores  muscle cramps, stiffness, or weakness  redness, blistering, peeling or loosening of the skin, including inside the mouth  trouble passing urine or change in the amount of urine Side effects that usually do not require medical attention (report to your doctor or health care professional if they continue or are bothersome):  bone, joint, or muscle pain  constipation  diarrhea  fever  hair loss  irritation at site where injected  loss of appetite  nausea, vomiting  stomach upset  trouble sleeping  trouble swallowing  weak or tired This list may not describe all possible side effects. Call your doctor for medical advice about side effects. You may report side effects to FDA at 1-800-FDA-1088. Where should I keep my medicine? This drug is given in a hospital or clinic and will not be stored at home. NOTE: This sheet is a summary. It may not cover all possible information. If you have questions about this medicine, talk to your doctor, pharmacist, or health care provider.  2020 [employer] (2014-02-17 14:19:39)  

## 2019-11-08 NOTE — Progress Notes (Signed)
HEMATOLOGY/ONCOLOGY CLINIC NOTE  Date of Service: 11/08/2019  Patient Care Team: [name], MD as PCP - General (Family Medicine) [name], MD as Consulting Physician (Hematology) Bond, [name], MD as Referring Physician (Ophthalmology) [name], [name], MD as Consulting Physician (Hematology and Oncology)  Dr. Ronney Lion as [place] Specialist  [phone]  CHIEF COMPLAINTS/PURPOSE OF CONSULTATION:   Multiple Myeloma- continued management  HISTORY OF PRESENTING ILLNESS:   Megan Olson is a wonderful 79 y.o. female who has been referred to [place] by Dr. 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 She is accompanied today by her husband. The pt reports that she is doing well overall.   The pt notes that she was doing extensive yard work in October 2019, and began to feel back pain a few days after this, which she attributed to muscle pain. She recalls taking a deep breath, and developing sudden acute pain, and presented to care with her PCP's office. She began exercises for her back pain, without relief, then began PT without relief, then was referred to Dr. Paulla Fore in sports medicine in late January 2020. She had an XR which revealed a compression fracture at T11, then subsequently had an MRI, and a bone marrow biopsy. The pt notes that her back pain "seemed to move around." She endorses pain "up the whole left side" of her back.  The pt notes that she is not able to stand up straight due to her back pain, which she feels limits her ability to take a deep breath, and endorses pain exacerbation when she does take a deep breath. The pt needs assistance from sitting to lying from her husband. She is using about 3 Percocet a day.  The pt notes worsened constipation since beginning Percocet, and notes that her most recent laxative order was not covered by her insurance. She is using prune juice and milk  of magnesia. She took Senokot S BID without relief.  The pt reports that prior to her recent back pain, she had very few medical concerns. She endorses controlled BP, and has been monitored for DM but after closely watching her diet her A1C decreased to 5.8. She has glaucoma, and has had surgery in both eyes. Right eye with stent. The pt sees Dr. Edilia Bo at [facility] for her eye care. The pt notes that her vision has been recently "pretty good." The pt notes that she has been advised to limit treatment with steroids for her glaucoma. She denies heart or lung problems. Denies previous back problems. Last DEXA scan 3 years ago, and endorses osteopenia. She notes that she took Fosamax for 3-4 years, and stopped 5-6 years ago. She takes Vitamin D, a multivitamin, and magnesium.  The pt notes that she quit smoking cigarettes when she was 27, started when she was about 20. The pt consumes alcohol rarely, but not since beginning narcotics. 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  Of note prior to the patient's visit today, pt has had an MRI Thoracic Spine completed on 11/27/18 with results revealing Multifocal marrow signal abnormality consistent with metastatic disease or multiple myeloma. The patient has multiple compression fractures most consistent with pathologic injuries. Extensive marrow signal abnormality makes determining age of the fractures difficult but edema is most intense in T3. 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  Most recent  lab results (12/01/18) of CBC w/diff is as follows: all values are WNL except for RBC at 3.17, HGB at 10.4, HCT at 33.5, MCV at 105.7. 11/28/18 CMP revealed all values WNL except for Glucose at 105, Total Protein at 10.2, Albumin at 3.1 11/30/18 24HR UPEP revealed all values WNL except for M-spike at 75.1%. 11/28/18 Bega-2 microglobulin slightly elevated at 2.6 11/28/18 SPEP revealed  M spike at 4.6g 11/28/18 Immunoglobulins revealed IgG at 6181, IgA at 24, IgM at 16, and IgE at 6.  On review of systems, pt reports constipation, back pain, stable energy levels, ankle swelling, tenderness at T3, lower back pain, and denies abdominal pains, neck pain, and any other symptoms.   On PMHx the pt reports redundant colon, single hemorrhoid, glaucoma, HTN, osteopenia, Tonsillectomy, right eye stent and multiple eye surgeries. On Social Hx the pt reports rare alcohol use, smoked cigarettes between ages 75 and 89. 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 On Family Hx the pt reports sister died from small cell lung cancer and was a lifelong smoker, brother's daughter died of breast cancer with BRCA1 and BRCA2 mutations. Cousin with female breast cancer.   INTERVAL HISTORY:   Megan Olson returns today for management and evaluation of her multiple myeloma. The patient's last visit with [place] was on 10/17/2019. The pt reports that she is doing well overall.  The pt reports that she has been feeling well since beginning Ninlaro. Pt has an intermittent tingling sensation from her right elbow down to the fingers in her right hand. This sensation has become more frequent since our last visit. It starts most often when she reaches for things in her cabinet or sits a particular way. It occurs more during the day and she has not noticed any change in the strength of her right arm or hand. Pt is currently scheduled to receive her first dose of the COVID19 vaccine tomorrow. Her back pain has been consistent and she is currently managing the discomfort with 3.5 Percocet per day and Fentanyl Patch. She is walking a mile per day, if the weather allows.   Lab results today (11/08/19) of CBC w/diff and CMP is as follows: all values are WNL except for WBC at 3.4K, RBC at 3.55, MCV at 104.8, MCH at 34.6, Lymphs Abs at 0.2K, Glucose at 100, ALP at 37.  On review of systems, pt reports right arm/hand tingling, chronic  back pain and denies N/V/D, abdominal pain and any other symptoms.   MEDICAL HISTORY:  Past Medical History:  Diagnosis Date  . Cancer ([place])    multiple myeloma  . Glaucoma   . HYPERTENSION 03/11/2007  . OSTEOPENIA 03/11/2007    SURGICAL HISTORY: Past Surgical History:  Procedure Laterality Date  . BREAST EXCISIONAL BIOPSY Right 2000  . BREAST LUMPECTOMY  1990   benign  . DILATION AND CURETTAGE OF UTERUS     bleeding at menopause. No uterine cancer  . IR RADIOLOGIST EVAL & MGMT  12/13/2018  . TONSILLECTOMY     age 89    SOCIAL HISTORY: Social History   Socioeconomic History  . Marital status: Married    Spouse name: Not on file  . Number of children: Not on file  . Years of education: Not on file  . Highest education level: Not on file  Occupational History  . Not on file  Tobacco Use  . Smoking status: Former Smoker    Packs/day: 0.50    Years: 7.00    Pack years: 3.50  Types: Cigarettes    Quit date: 01/04/1961    Years since quitting: 58.8  . Smokeless tobacco: Never Used  Substance and Sexual Activity  . Alcohol use: Yes    Alcohol/week: 1.0 standard drinks    Types: 1 Standard drinks or equivalent per week  . Drug use: No  . Sexual activity: Not Currently  Other Topics Concern  . Not on file  Social History Narrative   Married. Lives with husband (patient of Dr. Yong Channel). 1 son. No grandkids. 1 granddog.       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 Difficulty of Paying Living Expenses: Not on file  Food Insecurity:   . Worried About Charity fundraiser in the Last Year: Not on file  . Ran Out of Food in the Last Year: Not on file  Transportation Needs:   . Lack of Transportation (Medical): Not on file  . Lack of Transportation (Non-Medical): Not on file  Physical  Activity:   . Days of Exercise per Week: Not on file  . Minutes of Exercise per Session: Not on file  Stress:   . Feeling of Stress : Not on file  Social Connections:   . Frequency of Communication with Friends and Family: Not on file  . Frequency of Social Gatherings with Friends and Family: Not on file  . Attends Religious Services: Not on file  . Active Member of Clubs or Organizations: Not on file  . Attends Archivist Meetings: Not on file  . Marital Status: Not on file  Intimate Partner Violence:   . Fear of Current or Ex-Partner: Not on file  . Emotionally Abused: Not on file  . Physically Abused: Not on file  . Sexually Abused: Not on file    FAMILY HISTORY: Family History  Problem Relation Age of Onset  . Heart disease Mother        CHF mother died 51  . Arthritis Mother   . Glaucoma Mother        sister as well  . Alcohol abuse Father   . Suicidality Father   . Cancer Sister        lung cancer, smoker  . Heart disease Sister        aortic valve replacement  . Hyperlipidemia Brother   . Hypertension Brother   . COPD Brother   . Arthritis Sister   . Hypertension Sister   . Glaucoma Sister   . Hashimoto's thyroiditis Sister   . Hypertension Son   . Stroke Maternal Grandmother   . Colon cancer Neg Hx   . Colon polyps Neg Hx     ALLERGIES:  is allergic to ace inhibitors; benadryl [diphenhydramine]; diamox [acetazolamide]; sulfamethoxazole; lenalidomide; and penicillins.   MEDICATIONS:  Current Outpatient Medications  Medication Sig Dispense Refill  . acyclovir (ZOVIRAX) 200 MG capsule Take 200 mg by mouth 2 (two) times daily.    . ALPHAGAN P 0.1 % SOLN Apply 1 drop topically See admin instructions. Apply 2 drops in right eye 3 times a day    . amLODipine (NORVASC) 5 MG tablet Take 1 tablet (5 mg total) by mouth daily. 90 tablet 3  . aspirin EC 81 MG tablet Take 81 mg by mouth daily.    . bimatoprost (LUMIGAN) 0.03 %  ophthalmic solution Place 1  drop into the right eye at bedtime.     . Cholecalciferol (VITAMIN D3) 1000 UNITS CAPS Take 3,000 Units by mouth daily.     Marland Kitchen co-enzyme Q-10 50 MG capsule Take 50 mg by mouth daily.      . dorzolamide-timolol (COSOPT) 22.3-6.8 MG/ML ophthalmic solution Place 2 drops into both eyes 2 (two) times daily.   11  . famotidine (PEPCID) 10 MG tablet Take 10 mg by mouth 2 (two) times daily.    . fentaNYL (DURAGESIC) 25 MCG/HR Place 1 patch onto the skin every 3 (three) days. 10 patch 0  . hydrOXYzine (ATARAX/VISTARIL) 25 MG tablet Take 1 tablet (25 mg total) by mouth 3 (three) times daily as needed. 30 tablet 0  . ixazomib citrate (NINLARO) 3 MG capsule Take 1 capsule (3 mg) by mouth weekly, 3 weeks on, 1 week off, repeat every 4 weeks. Take on an empty stomach 1hr before or 2hr after meals. 3 capsule 2  . magnesium hydroxide (MILK OF MAGNESIA) 400 MG/5ML suspension Take 15 mLs by mouth daily as needed for mild constipation or moderate constipation. 355 mL 0  . Magnesium Oxide 500 MG TABS Take 1 tablet by mouth daily.      . Multiple Vitamins-Minerals (MULTIVITAMIN WITH MINERALS) tablet Take 1 tablet by mouth daily.      Marland Kitchen oxyCODONE-acetaminophen (PERCOCET) 5-325 MG tablet Take 1-2 tablets by mouth every 4 (four) hours as needed for moderate pain or severe pain. 90 tablet 0  . polyethylene glycol (MIRALAX) packet Take 17 g by mouth daily. 30 each 1  . senna-docusate (SENOKOT-S) 8.6-50 MG tablet Take 2 tablets by mouth at bedtime. 60 tablet 2  . vitamin C (ASCORBIC ACID) 500 MG tablet Take 500 mg by mouth daily.     No current facility-administered medications for this visit.    REVIEW OF SYSTEMS:   A 10+ POINT REVIEW OF SYSTEMS WAS OBTAINED including neurology, dermatology, psychiatry, cardiac, respiratory, lymph, extremities, GI, GU, Musculoskeletal, constitutional, breasts, reproductive, HEENT.  All pertinent positives are noted in the HPI.  All others are negative.    PHYSICAL EXAMINATION: ECOG  PERFORMANCE STATUS: 1-2  There were no vitals filed for this visit. There were no vitals filed for this visit. There is no height or weight on file to calculate BMI.   Exam was given in a chair   GENERAL:alert, in no acute distress and comfortable SKIN: no acute rashes, no significant lesions EYES: conjunctiva are pink and non-injected, sclera anicteric OROPHARYNX: MMM, no exudates, no oropharyngeal erythema or ulceration NECK: supple, no JVD LYMPH:  no palpable lymphadenopathy in the cervical, axillary or inguinal regions LUNGS: clear to auscultation b/l with normal respiratory effort HEART: regular rate & rhythm ABDOMEN:  normoactive bowel sounds , non tender, not distended. No palpable hepatosplenomegaly.  Extremity: no pedal edema PSYCH: alert & oriented x 3 with fluent speech NEURO: no focal motor/sensory deficits  LABORATORY DATA:  I have reviewed the data as listed  . CBC Latest Ref Rng & Units 10/17/2019 10/04/2019 09/19/2019  WBC 4.0 - 10.5 K/uL 4.6 2.7(L) 5.3  Hemoglobin 12.0 - 15.0 g/dL 11.4(L) 10.4(L) 9.8(L)  Hematocrit 36.0 - 46.0 % 34.8(L) 31.2(L) 29.1(L)  Platelets 150 - 400 K/uL 225 199 265    . CMP Latest Ref Rng & Units 10/17/2019 10/04/2019 09/19/2019  Glucose 70 - 99 mg/dL 152(H) 87 109(H)  BUN 8 - 23 mg/dL '13 14 15  '$ Creatinine 0.44 - 1.00 mg/dL 0.71  0.64 0.64  Sodium 135 - 145 mmol/L 142 143 138  Potassium 3.5 - 5.1 mmol/L 4.0 4.0 4.2  Chloride 98 - 111 mmol/L 104 104 102  CO2 22 - 32 mmol/L '27 29 28  '$ Calcium 8.9 - 10.3 mg/dL 9.0 8.9 8.7(L)  Total Protein 6.5 - 8.1 g/dL 6.5 6.0(L) 5.9(L)  Total Bilirubin 0.3 - 1.2 mg/dL 0.4 0.4 0.2(L)  Alkaline Phos 38 - 126 U/L 36(L) 36(L) 34(L)  AST 15 - 41 U/L '15 18 18  '$ ALT 0 - 44 U/L '18 20 17   '$ 09/11/2019 Flow Pathology Report [id number]):   09/11/2019 Bone Marrow Report [id number]):    07/14/2019 Cytogenetics [id number]):    07/14/2019 Flow Pathology Report ([id number]):   07/14/2019 BM Bx  ([id number]):   Most recent MMP 04/25/2019 Component     Latest Ref Rng & Units 04/25/2019  IgG (Immunoglobin G), Serum     586 - 1,602 mg/dL 702  IgA     64 - 422 mg/dL 39 (L)  IgM (Immunoglobulin M), Srm     26 - 217 mg/dL 26  Total Protein ELP     6.0 - 8.5 g/dL 5.9 (L)  Albumin SerPl Elph-Mcnc     2.9 - 4.4 g/dL 3.8  Alpha 1     0.0 - 0.4 g/dL 0.2  Alpha2 Glob SerPl Elph-Mcnc     0.4 - 1.0 g/dL 0.7  B-Globulin SerPl Elph-Mcnc     0.7 - 1.3 g/dL 1.0  Gamma Glob SerPl Elph-Mcnc     0.4 - 1.8 g/dL 0.3 (L)  M Protein SerPl Elph-Mcnc     Not Observed g/dL 0.4 (H)  Globulin, Total     2.2 - 3.9 g/dL 2.1 (L)  Albumin/Glob SerPl     0.7 - 1.7 1.9 (H)  IFE 1      Comment  Please Note (HCV):      Comment    12/01/18 BM Bx:   12/01/18 Flow Cytometry:       RADIOGRAPHIC STUDIES: I have personally reviewed the radiological images as listed and agreed with the findings in the report. No results found.   ASSESSMENT & PLAN:  79 y.o. female with  1. Multiple Myeloma with IgG Lambda specificity Labs upon initial presentation from 12/01/18 CBC w/diff revealed HGB at 10.4 with MCV of 105.7. 11/28/18 CMP revealed Creatinine normal at 0.68 and Calcium normal at 8.9. 11/28/18 Beta 2 microglobulin at 2.'6mg'$  (also a reading at 4.'7mg'$  on the same day). 11/30/18 24HR UPEP revealed M spike at 75.1%. 11/28/18 SPEP revealed M spike of 4.6g. 11/28/18 Immunoglobulins revealed IgG elevated at '6181mg'$ .  12/01/18 BM Bx revealed hypercellular bone marrow with 70-80% CD138 immunohistochemistry (44% aspirate) lambda-restricted plasma cells as well as a kappa restricted population of B-cells 11/27/18 MRI Thoracic Spine which revealed Multifocal marrow signal abnormality consistent with metastatic disease or multiple myeloma. The patient has multiple compression fractures most consistent with pathologic injuries. Extensive marrow signal abnormality makes determining age of the fractures difficult but edema  is most intense in T3. 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 2. Areas of hypermetabolic disease identified in both lungs suggesting multiple myeloma involvement. 3. 1.9 cm calcified  left thyroid nodule is hypermetabolic, but Indeterminate. 4. Colon is diffusely distended with gas and stool. Imaging features would be compatible with clinical constipation. 5.  Aortic Atherosclerois.  Pt describes grade II to III rash on her re-challenge from Revlimid with optimized pre-medications, and we discontinued Revlimid  Began Cytoxan with C4 Velcade and Dexamethasone  05/16/2019 M spike is down to 0.2g/dl showing continued improvement and a 90% reduction  -07/14/2019 BM Bx revealed BONE MARROW: - Hypercellular marrow with residual plasma cell neoplasm (<10%) PERIPHERAL BLOOD: - Pancytopenia".   07/17/2019 PET/CT Whole Body Scan ([id number]) revealed "1. 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 2. Hypermetabolic calcified left thyroid nodule, as before, indeterminate. 3. Aortic atherosclerosis (ICD10-170.0). Coronary artery calcification."  09/11/2019 Bone Marrow Report ([id number]) revealed "BONE MARROW, ASPIRATE, CLOT, CORE:  -Variably cellular bone marrow with trilineage hematopoiesis and 2% plasma cells PERIPHERAL BLOOD:  -Pancytopenia".  09/11/2019 Flow Pathology Report [id number]) revealed "-Predominance of T lymphocytes with relative abundance of CD8 positive cells  -No significant B-cell population identified -Relative abundance of natural killer cells".  09/14/2019 PET/CT ([id number])  revealed "1. No findings of active malignancy. 2. Numerous thoracolumbar compression fractures many of which may be associated with osteoporosis. 3. Calcified 1.5 cm in diameter left thyroid nodule with persistent elevated activity. A significant minority of hypermetabolic thyroid nodules can be malignant, if not previously worked up to then thyroid ultrasound would be recommended as a next step. 4. There a few small lucent and small sclerotic lesions in the skeleton which are not hypermetabolic, possibilities include benign lesions or previously effectively treated myeloma. 5. Other imaging findings of potential clinical significance: Aortic Atherosclerosis (ICD10-I70.0). Coronary atherosclerosis. Prominent stool throughout the colon favors constipation. Hyperdense right renal lesion is probably a complex cyst but technically nonspecific."  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 She does not currently have elevated lymphocyte counts on peripheral blood draws.   PLAN: -Discussed pt labwork today, 11/08/19; Hgb has improved, other blood counts and blood chemistries look good -Advised pt that tingling in right arm is most likely from entrapment to her ulnar nerve. If tingling continues would get a nerve conduction study with Neurologist. -Recommend pt to keep pressure off of her right elbow and use a pillow underneath if she needs to put any pressure on it -No lab or clinical progression of Multiple Myeloma or Monoclonal B-cell Lymphocytosis at this time -The pt has no prohibitive toxicities from continuing Ninlaro at this time -Beginning Zometa today, continue q8weeks  -Recommend pt f/u with COVID19 vaccine as scheduled -Continue 3000IU Vitamin D daily  -Will see back in 4 weeks with labs   FOLLOW UP: Return to  clinic with Dr. Irene Limbo in 4 weeks with labs Return to clinic with Dr. Irene Limbo with labs and Zometa infusion in 8 weeks   The total time spent in the appt was 20 minutes and more than 50% was on counseling and direct patient cares.  All of the patient's questions were answered with apparent satisfaction. The patient knows to call the clinic with any problems, questions or concerns.    Sullivan Lone MD [place] AAHIVMS [facility] Hematology/Oncology Physician [facility]  (Office):       [phone] (Work cell):  [phone] (Fax):  [phone]  11/08/2019 4:13 AM  I, Yevette Edwards, am acting as a scribe for Dr. Sullivan Lone.   .I have reviewed the above documentation for accuracy and completeness, and I agree with the above. [name] MD

## 2019-11-09 ENCOUNTER — Ambulatory Visit: Payer: Medicare HMO | Attending: Internal Medicine

## 2019-11-09 DIAGNOSIS — Z23 Encounter for immunization: Secondary | ICD-10-CM

## 2019-11-09 NOTE — Progress Notes (Signed)
   Covid-19 Vaccination Clinic  Name:  Megan Olson    MRN: [id number] DOB: 1941-04-04  11/09/2019  [name] was observed post Covid-19 immunization for 15 minutes without incidence. She was provided with Vaccine Information Sheet and instruction to access the V-Safe system.   [name] was instructed to call 911 with any severe reactions post vaccine: Marland Kitchen Difficulty breathing  . Swelling of your face and throat  . A fast heartbeat  . A bad rash all over your body  . Dizziness and weakness    Immunizations Administered    Name Date Dose VIS Date Route   Pfizer COVID-19 Vaccine 11/09/2019 10:18 AM 0.3 mL 09/15/2019 Intramuscular   Manufacturer: [place]   Lot: [id number]   [place]: [id number]

## 2019-11-10 ENCOUNTER — Telehealth: Payer: Self-pay | Admitting: Hematology

## 2019-11-10 NOTE — Telephone Encounter (Signed)
Scheduled per los, patient has been called and notified. 

## 2019-11-13 ENCOUNTER — Other Ambulatory Visit: Payer: Self-pay | Admitting: *Deleted

## 2019-11-13 MED ORDER — FENTANYL 25 MCG/HR TD PT72: 1.0000 | MEDICATED_PATCH | TRANSDERMAL | 0 refills | Status: DC

## 2019-11-13 NOTE — Telephone Encounter (Signed)
Patient requested refill of fentanyl patches. Patient states it often takes pharmacy several days to get med in stock. Refill request sent to Dr. Irene Limbo.

## 2019-11-20 DIAGNOSIS — H401122 Primary open-angle glaucoma, left eye, moderate stage: Secondary | ICD-10-CM | POA: Diagnosis not present

## 2019-11-20 DIAGNOSIS — H401112 Primary open-angle glaucoma, right eye, moderate stage: Secondary | ICD-10-CM | POA: Diagnosis not present

## 2019-11-29 ENCOUNTER — Other Ambulatory Visit: Payer: Self-pay | Admitting: *Deleted

## 2019-11-29 DIAGNOSIS — C9000 Multiple myeloma not having achieved remission: Secondary | ICD-10-CM

## 2019-11-29 MED ORDER — OXYCODONE-ACETAMINOPHEN 5-325 MG PO TABS: 1.0000 | ORAL_TABLET | ORAL | 0 refills | Status: DC | PRN

## 2019-11-29 NOTE — Telephone Encounter (Signed)
Requested refill for oxycodone before weekend

## 2019-11-30 MED FILL — NINLARO 3 MG CAPS: 3 | 28 days supply | Qty: 3 | Fill #2

## 2019-12-04 ENCOUNTER — Ambulatory Visit: Payer: Medicare HMO | Attending: Internal Medicine

## 2019-12-04 ENCOUNTER — Other Ambulatory Visit: Payer: Self-pay

## 2019-12-04 DIAGNOSIS — Z23 Encounter for immunization: Secondary | ICD-10-CM | POA: Insufficient documentation

## 2019-12-04 NOTE — Progress Notes (Signed)
   Covid-19 Vaccination Clinic  Name:  Megan Olson    MRN: [id number] DOB: 1941/09/23  12/04/2019  [name] was observed post Covid-19 immunization for 15 minutes without incidence. She was provided with Vaccine Information Sheet and instruction to access the V-Safe system.   [name] was instructed to call 911 with any severe reactions post vaccine: Marland Kitchen Difficulty breathing  . Swelling of your face and throat  . A fast heartbeat  . A bad rash all over your body  . Dizziness and weakness    Immunizations Administered    Name Date Dose VIS Date Route   Pfizer COVID-19 Vaccine 12/04/2019  2:44 PM 0.3 mL 09/15/2019 Intramuscular   Manufacturer: [place]   Lot: [id number]   [place]: [id number]

## 2019-12-06 ENCOUNTER — Other Ambulatory Visit: Payer: Self-pay

## 2019-12-06 ENCOUNTER — Inpatient Hospital Stay: Payer: Medicare HMO | Attending: Hematology

## 2019-12-06 ENCOUNTER — Inpatient Hospital Stay (HOSPITAL_BASED_OUTPATIENT_CLINIC_OR_DEPARTMENT_OTHER): Payer: Medicare HMO | Admitting: Hematology

## 2019-12-06 VITALS — BP 184/89 | HR 70 | Temp 98.7°F | Resp 18 | Ht 62.5 in | Wt 108.1 lb

## 2019-12-06 DIAGNOSIS — K5900 Constipation, unspecified: Secondary | ICD-10-CM | POA: Insufficient documentation

## 2019-12-06 DIAGNOSIS — C9000 Multiple myeloma not having achieved remission: Secondary | ICD-10-CM

## 2019-12-06 DIAGNOSIS — Z79899 Other long term (current) drug therapy: Secondary | ICD-10-CM | POA: Diagnosis not present

## 2019-12-06 DIAGNOSIS — I10 Essential (primary) hypertension: Secondary | ICD-10-CM | POA: Diagnosis not present

## 2019-12-06 DIAGNOSIS — M549 Dorsalgia, unspecified: Secondary | ICD-10-CM | POA: Insufficient documentation

## 2019-12-06 DIAGNOSIS — Z7982 Long term (current) use of aspirin: Secondary | ICD-10-CM | POA: Diagnosis not present

## 2019-12-06 DIAGNOSIS — D61818 Other pancytopenia: Secondary | ICD-10-CM | POA: Insufficient documentation

## 2019-12-06 DIAGNOSIS — M8580 Other specified disorders of bone density and structure, unspecified site: Secondary | ICD-10-CM | POA: Insufficient documentation

## 2019-12-06 DIAGNOSIS — I700 Atherosclerosis of aorta: Secondary | ICD-10-CM | POA: Insufficient documentation

## 2019-12-06 DIAGNOSIS — Z87891 Personal history of nicotine dependence: Secondary | ICD-10-CM | POA: Insufficient documentation

## 2019-12-06 LAB — CBC WITH DIFFERENTIAL/PLATELET
Abs Immature Granulocytes: 0.01 10*3/uL (ref 0.00–0.07)
Basophils Absolute: 0 10*3/uL (ref 0.0–0.1)
Basophils Relative: 2 %
Eosinophils Absolute: 0.1 10*3/uL (ref 0.0–0.5)
Eosinophils Relative: 3 %
HCT: 35.8 % — ABNORMAL LOW (ref 36.0–46.0)
Hemoglobin: 11.4 g/dL — ABNORMAL LOW (ref 12.0–15.0)
Immature Granulocytes: 0 %
Lymphocytes Relative: 8 %
Lymphs Abs: 0.2 10*3/uL — ABNORMAL LOW (ref 0.7–4.0)
MCH: 33.8 pg (ref 26.0–34.0)
MCHC: 31.8 g/dL (ref 30.0–36.0)
MCV: 106.2 fL — ABNORMAL HIGH (ref 80.0–100.0)
Monocytes Absolute: 0.6 10*3/uL (ref 0.1–1.0)
Monocytes Relative: 24 %
Neutro Abs: 1.5 10*3/uL — ABNORMAL LOW (ref 1.7–7.7)
Neutrophils Relative %: 63 %
Platelets: 182 10*3/uL (ref 150–400)
RBC: 3.37 MIL/uL — ABNORMAL LOW (ref 3.87–5.11)
RDW: 13.2 % (ref 11.5–15.5)
WBC: 2.4 10*3/uL — ABNORMAL LOW (ref 4.0–10.5)
nRBC: 0 % (ref 0.0–0.2)

## 2019-12-06 LAB — CMP (CANCER CENTER ONLY)
ALT: 15 U/L (ref 0–44)
AST: 19 U/L (ref 15–41)
Albumin: 4.2 g/dL (ref 3.5–5.0)
Alkaline Phosphatase: 38 U/L (ref 38–126)
Anion gap: 9 (ref 5–15)
BUN: 15 mg/dL (ref 8–23)
CO2: 31 mmol/L (ref 22–32)
Calcium: 9.4 mg/dL (ref 8.9–10.3)
Chloride: 104 mmol/L (ref 98–111)
Creatinine: 0.85 mg/dL (ref 0.44–1.00)
GFR, Est AFR Am: >60 mL/min (ref >60)
GFR, Estimated: >60 mL/min (ref >60)
Glucose, Bld: 152 mg/dL — ABNORMAL HIGH (ref 70–99)
Potassium: 4 mmol/L (ref 3.5–5.1)
Sodium: 144 mmol/L (ref 135–145)
Total Bilirubin: <0.2 mg/dL — ABNORMAL LOW (ref 0.3–1.2)
Total Protein: 6.7 g/dL (ref 6.5–8.1)

## 2019-12-06 NOTE — Progress Notes (Signed)
HEMATOLOGY/ONCOLOGY CLINIC NOTE  Date of Service: 12/06/2019  Patient Care Team: [name], MD as PCP - General (Family Medicine) [name], MD as Consulting Physician (Hematology) Bond, [name], MD as Referring Physician (Ophthalmology) [name], [name], MD as Consulting Physician (Hematology and Oncology)  Dr. Ronney Lion as [place] Specialist  [phone]  CHIEF COMPLAINTS/PURPOSE OF CONSULTATION:   Multiple Myeloma- continued management  HISTORY OF PRESENTING ILLNESS:   Megan Olson is a wonderful 79 y.o. female who has been referred to [place] by Dr. 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 She is accompanied today by her husband. The pt reports that she is doing well overall.   The pt notes that she was doing extensive yard work in October 2019, and began to feel back pain a few days after this, which she attributed to muscle pain. She recalls taking a deep breath, and developing sudden acute pain, and presented to care with her PCP's office. She began exercises for her back pain, without relief, then began PT without relief, then was referred to Dr. Paulla Fore in sports medicine in late January 2020. She had an XR which revealed a compression fracture at T11, then subsequently had an MRI, and a bone marrow biopsy. The pt notes that her back pain "seemed to move around." She endorses pain "up the whole left side" of her back.  The pt notes that she is not able to stand up straight due to her back pain, which she feels limits her ability to take a deep breath, and endorses pain exacerbation when she does take a deep breath. The pt needs assistance from sitting to lying from her husband. She is using about 3 Percocet a day.  The pt notes worsened constipation since beginning Percocet, and notes that her most recent laxative order was not covered by her insurance. She is using prune juice and milk  of magnesia. She took Senokot S BID without relief.  The pt reports that prior to her recent back pain, she had very few medical concerns. She endorses controlled BP, and has been monitored for DM but after closely watching her diet her A1C decreased to 5.8. She has glaucoma, and has had surgery in both eyes. Right eye with stent. The pt sees Dr. Edilia Bo at [facility] for her eye care. The pt notes that her vision has been recently "pretty good." The pt notes that she has been advised to limit treatment with steroids for her glaucoma. She denies heart or lung problems. Denies previous back problems. Last DEXA scan 3 years ago, and endorses osteopenia. She notes that she took Fosamax for 3-4 years, and stopped 5-6 years ago. She takes Vitamin D, a multivitamin, and magnesium.  The pt notes that she quit smoking cigarettes when she was 27, started when she was about 20. The pt consumes alcohol rarely, but not since beginning narcotics. 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  Of note prior to the patient's visit today, pt has had an MRI Thoracic Spine completed on 11/27/18 with results revealing Multifocal marrow signal abnormality consistent with metastatic disease or multiple myeloma. The patient has multiple compression fractures most consistent with pathologic injuries. Extensive marrow signal abnormality makes determining age of the fractures difficult but edema is most intense in T3. 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  Most recent  lab results (12/01/18) of CBC w/diff is as follows: all values are WNL except for RBC at 3.17, HGB at 10.4, HCT at 33.5, MCV at 105.7. 11/28/18 CMP revealed all values WNL except for Glucose at 105, Total Protein at 10.2, Albumin at 3.1 11/30/18 24HR UPEP revealed all values WNL except for M-spike at 75.1%. 11/28/18 Bega-2 microglobulin slightly elevated at 2.6 11/28/18 SPEP revealed  M spike at 4.6g 11/28/18 Immunoglobulins revealed IgG at 6181, IgA at 24, IgM at 16, and IgE at 6.  On review of systems, pt reports constipation, back pain, stable energy levels, ankle swelling, tenderness at T3, lower back pain, and denies abdominal pains, neck pain, and any other symptoms.   On PMHx the pt reports redundant colon, single hemorrhoid, glaucoma, HTN, osteopenia, Tonsillectomy, right eye stent and multiple eye surgeries. On Social Hx the pt reports rare alcohol use, smoked cigarettes between ages 71 and 28. 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 On Family Hx the pt reports sister died from small cell lung cancer and was a lifelong smoker, brother's daughter died of breast cancer with BRCA1 and BRCA2 mutations. Cousin with female breast cancer.   INTERVAL HISTORY:   Megan Olson returns today for management and evaluation of her multiple myeloma. The patient's last visit with [place] was on 11/08/2019. The pt reports that she is doing well overall.  The pt reports that she has had both doses of the COVID19 vaccine. She is eating well, sleeping well, and her pain is fairly well controlled. Pt is using a Fentanyl Patch and about 3 Oxycodone per day. Pt and her husband have been walking a fair amount daily, up to one mile. She does have some back pain during her extensive walks. Her right arm tingling is about the same and appears to be positional. Pt has an upcoming mammogram.   Lab results today (12/06/19) of CBC w/diff and CMP is as follows: all values are WNL except for WBC at 2.4K, RBC at 3.37, Hgb at 11.4, HCT at 35.8, MCV at 106.2, Neutro Abs at 1.5K, Lymphs Abs at 0.2K, Glucose at 152, Total Bilirubin at <0.2.  On review of systems, pt reports right arm tingling, neck discomfort, back pain, healthy appetite and denies sleeplessness and any other symptoms.   MEDICAL HISTORY:  Past Medical History:  Diagnosis Date  . Cancer ([place])    multiple myeloma  . Glaucoma   . HYPERTENSION  03/11/2007  . OSTEOPENIA 03/11/2007    SURGICAL HISTORY: Past Surgical History:  Procedure Laterality Date  . BREAST EXCISIONAL BIOPSY Right 2000  . BREAST LUMPECTOMY  1990   benign  . DILATION AND CURETTAGE OF UTERUS     bleeding at menopause. No uterine cancer  . IR RADIOLOGIST EVAL & MGMT  12/13/2018  . TONSILLECTOMY     age 18    SOCIAL HISTORY: Social History   Socioeconomic History  . Marital status: Married    Spouse name: Not on file  . Number of children: Not on file  . Years of education: Not on file  . Highest education level: Not on file  Occupational History  . Not on file  Tobacco Use  . Smoking status: Former Smoker    Packs/day: 0.50    Years: 7.00    Pack years: 3.50    Types: Cigarettes    Quit date: 01/04/1961    Years since quitting: 58.9  . Smokeless tobacco: Never Used  Substance and Sexual Activity  . Alcohol use: Yes  Alcohol/week: 1.0 standard drinks    Types: 1 Standard drinks or equivalent per week  . Drug use: No  . Sexual activity: Not Currently  Other Topics Concern  . Not on file  Social History Narrative   Married. Lives with husband (patient of Dr. Yong Channel). 1 son. No grandkids. 1 granddog.       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 Difficulty of Paying Living Expenses: Not on file  Food Insecurity:   . Worried About Charity fundraiser in the Last Year: Not on file  . Ran Out of Food in the Last Year: Not on file  Transportation Needs:   . Lack of Transportation (Medical): Not on file  . Lack of Transportation (Non-Medical): Not on file  Physical Activity:   . Days of Exercise per Week: Not on file  . Minutes of Exercise per Session: Not on file  Stress:   . Feeling of Stress : Not on file  Social Connections:   . Frequency of Communication  with Friends and Family: Not on file  . Frequency of Social Gatherings with Friends and Family: Not on file  . Attends Religious Services: Not on file  . Active Member of Clubs or Organizations: Not on file  . Attends Archivist Meetings: Not on file  . Marital Status: Not on file  Intimate Partner Violence:   . Fear of Current or Ex-Partner: Not on file  . Emotionally Abused: Not on file  . Physically Abused: Not on file  . Sexually Abused: Not on file    FAMILY HISTORY: Family History  Problem Relation Age of Onset  . Heart disease Mother        CHF mother died 62  . Arthritis Mother   . Glaucoma Mother        sister as well  . Alcohol abuse Father   . Suicidality Father   . Cancer Sister        lung cancer, smoker  . Heart disease Sister        aortic valve replacement  . Hyperlipidemia Brother   . Hypertension Brother   . COPD Brother   . Arthritis Sister   . Hypertension Sister   . Glaucoma Sister   . Hashimoto's thyroiditis Sister   . Hypertension Son   . Stroke Maternal Grandmother   . Colon cancer Neg Hx   . Colon polyps Neg Hx     ALLERGIES:  is allergic to ace inhibitors; benadryl [diphenhydramine]; diamox [acetazolamide]; sulfamethoxazole; lenalidomide; and penicillins.   MEDICATIONS:  Current Outpatient Medications  Medication Sig Dispense Refill  . acyclovir (ZOVIRAX) 200 MG capsule Take 200 mg by mouth 2 (two) times daily.    . ALPHAGAN P 0.1 % SOLN Apply 1 drop topically See admin instructions. Apply 2 drops in right eye 3 times a day    . amLODipine (NORVASC) 5 MG tablet Take 1 tablet (5 mg total) by mouth daily. 90 tablet 3  . aspirin EC 81 MG tablet Take 81 mg by mouth daily.    . bimatoprost (LUMIGAN) 0.03 % ophthalmic solution Place 1 drop into the right eye at bedtime.     . Cholecalciferol (VITAMIN D3) 1000 UNITS CAPS Take 3,000 Units by mouth daily.     Marland Kitchen  co-enzyme Q-10 50 MG capsule Take 50 mg by mouth daily.      .  dorzolamide-timolol (COSOPT) 22.3-6.8 MG/ML ophthalmic solution Place 2 drops into both eyes 2 (two) times daily.   11  . famotidine (PEPCID) 10 MG tablet Take 10 mg by mouth 2 (two) times daily.    . fentaNYL (DURAGESIC) 25 MCG/HR Place 1 patch onto the skin every 3 (three) days. 10 patch 0  . hydrOXYzine (ATARAX/VISTARIL) 25 MG tablet Take 1 tablet (25 mg total) by mouth 3 (three) times daily as needed. 30 tablet 0  . ixazomib citrate (NINLARO) 3 MG capsule Take 1 capsule (3 mg) by mouth weekly, 3 weeks on, 1 week off, repeat every 4 weeks. Take on an empty stomach 1hr before or 2hr after meals. 3 capsule 2  . magnesium hydroxide (MILK OF MAGNESIA) 400 MG/5ML suspension Take 15 mLs by mouth daily as needed for mild constipation or moderate constipation. 355 mL 0  . Magnesium Oxide 500 MG TABS Take 1 tablet by mouth daily.      . Multiple Vitamins-Minerals (MULTIVITAMIN WITH MINERALS) tablet Take 1 tablet by mouth daily.      Marland Kitchen oxyCODONE-acetaminophen (PERCOCET) 5-325 MG tablet Take 1-2 tablets by mouth every 4 (four) hours as needed for moderate pain or severe pain. 90 tablet 0  . polyethylene glycol (MIRALAX) packet Take 17 g by mouth daily. 30 each 1  . senna-docusate (SENOKOT-S) 8.6-50 MG tablet Take 2 tablets by mouth at bedtime. 60 tablet 2  . vitamin C (ASCORBIC ACID) 500 MG tablet Take 500 mg by mouth daily.     No current facility-administered medications for this visit.    REVIEW OF SYSTEMS:   A 10+ POINT REVIEW OF SYSTEMS WAS OBTAINED including neurology, dermatology, psychiatry, cardiac, respiratory, lymph, extremities, GI, GU, Musculoskeletal, constitutional, breasts, reproductive, HEENT.  All pertinent positives are noted in the HPI.  All others are negative.   PHYSICAL EXAMINATION: ECOG PERFORMANCE STATUS: 1-2  Vitals:   12/06/19 1430  BP: (!) 184/89  Pulse: 70  Resp: 18  Temp: 98.7 F (37.1 C)  SpO2: 97%   Filed Weights   12/06/19 1430  Weight: 108 lb 1.6 oz (49 kg)     Body mass index is 19.46 kg/m.   Exam was given in a chair   GENERAL:alert, in no acute distress and comfortable SKIN: no acute rashes, no significant lesions EYES: conjunctiva are pink and non-injected, sclera anicteric OROPHARYNX: MMM, no exudates, no oropharyngeal erythema or ulceration NECK: supple, no JVD LYMPH:  no palpable lymphadenopathy in the cervical, axillary or inguinal regions LUNGS: clear to auscultation b/l with normal respiratory effort HEART: regular rate & rhythm ABDOMEN:  normoactive bowel sounds , non tender, not distended. No palpable hepatosplenomegaly.  Extremity: no pedal edema PSYCH: alert & oriented x 3 with fluent speech NEURO: no focal motor/sensory deficits  LABORATORY DATA:  I have reviewed the data as listed  . CBC Latest Ref Rng & Units 12/06/2019 11/08/2019 10/17/2019  WBC 4.0 - 10.5 K/uL 2.4(L) 3.4(L) 4.6  Hemoglobin 12.0 - 15.0 g/dL 11.4(L) 12.3 11.4(L)  Hematocrit 36.0 - 46.0 % 35.8(L) 37.2 34.8(L)  Platelets 150 - 400 K/uL 182 199 225    . CMP Latest Ref Rng & Units 12/06/2019 11/08/2019 10/17/2019  Glucose 70 - 99 mg/dL 152(H) 100(H) 152(H)  BUN 8 - 23 mg/dL _0 Creatinine 0.44 - 1.00 mg/dL 0.85 0.69 0.71  Sodium 135 - 145 mmol/L 144 142 142  Potassium 3.5 - 5.1 mmol/L 4.0 4.2 4.0  Chloride 98 - 111 mmol/L 104 106 104  CO2 22 - 32 mmol/L _0 Calcium 8.9 - 10.3 mg/dL 9.4 9.3 9.0  Total Protein 6.5 - 8.1 g/dL 6.7 6.6 6.5  Total Bilirubin 0.3 - 1.2 mg/dL <0.2(L) 0.4 0.4  Alkaline Phos 38 - 126 U/L 38 37(L) 36(L)  AST 15 - 41 U/L _1 ALT 0 - 44 U/L _2 09/11/2019 Flow Pathology Report [id number]):   09/11/2019 Bone Marrow Report [id number]):    07/14/2019 Cytogenetics ([id number]):    07/14/2019 Flow Pathology Report ([id number]):   07/14/2019 BM Bx ([id number]):   Most recent MMP 04/25/2019 Component     Latest Ref Rng & Units 04/25/2019  IgG (Immunoglobin G), Serum     586 - 1,602 mg/dL  702  IgA     64 - 422 mg/dL 39 (L)  IgM (Immunoglobulin M), Srm     26 - 217 mg/dL 26  Total Protein ELP     6.0 - 8.5 g/dL 5.9 (L)  Albumin SerPl Elph-Mcnc     2.9 - 4.4 g/dL 3.8  Alpha 1     0.0 - 0.4 g/dL 0.2  Alpha2 Glob SerPl Elph-Mcnc     0.4 - 1.0 g/dL 0.7  B-Globulin SerPl Elph-Mcnc     0.7 - 1.3 g/dL 1.0  Gamma Glob SerPl Elph-Mcnc     0.4 - 1.8 g/dL 0.3 (L)  M Protein SerPl Elph-Mcnc     Not Observed g/dL 0.4 (H)  Globulin, Total     2.2 - 3.9 g/dL 2.1 (L)  Albumin/Glob SerPl     0.7 - 1.7 1.9 (H)  IFE 1      Comment  Please Note (HCV):      Comment    12/01/18 BM Bx:   12/01/18 Flow Cytometry:       RADIOGRAPHIC STUDIES: I have personally reviewed the radiological images as listed and agreed with the findings in the report. No results found.   ASSESSMENT & PLAN:  79 y.o. female with  1. Multiple Myeloma with IgG Lambda specificity Labs upon initial presentation from 12/01/18 CBC w/diff revealed HGB at 10.4 with MCV of 105.7. 11/28/18 CMP revealed Creatinine normal at 0.68 and Calcium normal at 8.9. 11/28/18 Beta 2 microglobulin at 2.80m (also a reading at 4.761mon the same day). 11/30/18 24HR UPEP revealed M spike at 75.1%. 11/28/18 SPEP revealed M spike of 4.6g. 11/28/18 Immunoglobulins revealed IgG elevated at 618112m 12/01/18 BM Bx revealed hypercellular bone marrow with 70-80% CD138 immunohistochemistry (44% aspirate) lambda-restricted plasma cells as well as a kappa restricted population of B-cells 11/27/18 MRI Thoracic Spine which revealed Multifocal marrow signal abnormality consistent with metastatic disease or multiple myeloma. The patient has multiple compression fractures most consistent with pathologic injuries. Extensive marrow signal abnormality makes determining age of the fractures difficult but edema is most intense in T3. Epidural tumor centrally and to the left posterior to T3 extends into the left neural foramen and could impact the left T3  root.  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 2. Areas of hypermetabolic disease identified in both lungs suggesting multiple myeloma involvement. 3. 1.9 cm calcified left thyroid nodule is hypermetabolic, but Indeterminate. 4. Colon is diffusely distended  with gas and stool. Imaging features would be compatible with clinical constipation. 5.  Aortic Atherosclerois.  Pt describes grade II to III rash on her re-challenge from Revlimid with optimized pre-medications, and we discontinued Revlimid  Began Cytoxan with C4 Velcade and Dexamethasone  05/16/2019 M spike is down to 0.2g/dl showing continued improvement and a 90% reduction  -07/14/2019 BM Bx revealed BONE MARROW: - Hypercellular marrow with residual plasma cell neoplasm (<10%) PERIPHERAL BLOOD: - Pancytopenia".   07/17/2019 PET/CT Whole Body Scan ([id number]) revealed "1. 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 2. Hypermetabolic calcified left thyroid nodule, as before, indeterminate. 3. Aortic atherosclerosis (ICD10-170.0). Coronary artery calcification."  09/11/2019 Bone Marrow Report ([id number]) revealed "BONE MARROW, ASPIRATE, CLOT, CORE:  -Variably cellular bone marrow with trilineage hematopoiesis and 2% plasma cells PERIPHERAL BLOOD:  -Pancytopenia".  09/11/2019 Flow Pathology Report [id number]) revealed "-Predominance of T lymphocytes with relative abundance of CD8 positive cells  -No significant B-cell population identified -Relative abundance of natural killer cells".  09/14/2019 PET/CT ([id number]) revealed "1. No findings of active malignancy. 2. Numerous thoracolumbar compression fractures many of which may be associated with osteoporosis. 3.  Calcified 1.5 cm in diameter left thyroid nodule with persistent elevated activity. A significant minority of hypermetabolic thyroid nodules can be malignant, if not previously worked up to then thyroid ultrasound would be recommended as a next step. 4. There a few small lucent and small sclerotic lesions in the skeleton which are not hypermetabolic, possibilities include benign lesions or previously effectively treated myeloma. 5. Other imaging findings of potential clinical significance: Aortic Atherosclerosis (ICD10-I70.0). Coronary atherosclerosis. Prominent stool throughout the colon favors constipation. Hyperdense right renal lesion is probably a complex cyst but technically nonspecific."  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 She does not currently have elevated lymphocyte counts on peripheral blood draws.   PLAN: -Discussed pt labwork today, 12/06/19; minor neutropenia, other blood counts and chemistries are stable  -No lab or clinical progression of Multiple Myeloma or Monoclonal B-cell Lymphocytosis at this time -The pt has no prohibitive toxicities from continuing Ninlaro at this time -Recommend pt avoid pushing or pulling anything heavy and being careful when twisting or bending -Recommend pt wait two months after her last COVID19 vaccine to receive the Shingrix vaccine -Advised pt that the COVID19 vaccine does add a layer of protection but it does not protect as well against variants -Advised again on safety precautions during the pandemic -Continue Zometa q8weeks  -Continue 3000IU Vitamin D daily  -Recommend pt f/u with PCP for neck discomfort and back pain -Will see back in 4 weeks with las    FOLLOW UP: Plz move lab appointment from 3/31 to 1 week earlier MD visit and Zometa on 3/31 as  scheduled   The total time spent in the appt was 20 minutes and more than 50% was on counseling and direct patient cares.  All of the patient's questions were answered with apparent satisfaction. The patient knows to call the clinic with any problems, questions or concerns.    Sullivan Lone MD [place] AAHIVMS [facility] Hematology/Oncology Physician [facility]  (Office):       [phone] (Work cell):  [phone] (Fax):           [phone]  12/06/2019 3:14 PM  I, Yevette Edwards,  am acting as a scribe for Dr. Sullivan Lone.   .I have reviewed the above documentation for accuracy and completeness, and I agree with the above. [name] MD

## 2019-12-07 ENCOUNTER — Encounter: Payer: Self-pay | Admitting: Family Medicine

## 2019-12-08 ENCOUNTER — Telehealth: Payer: Self-pay

## 2019-12-08 ENCOUNTER — Telehealth: Payer: Self-pay | Admitting: Hematology

## 2019-12-08 NOTE — Telephone Encounter (Signed)
Patient called wanting to know why her lab appt date was changed. Informed patient per Dr. Kelle Darting of 12/06/19 he requested lab moved to 1 week earlier MD visit and Zometa on 01/03/20 as scheduled. Explained that this may be because some lab work take about a week to result. Patient was agreeable with this and verbalized understanding.

## 2019-12-08 NOTE — Telephone Encounter (Signed)
Scheduled per 03/03 los, patient has been called and notified.  

## 2019-12-13 ENCOUNTER — Telehealth: Payer: Self-pay

## 2019-12-13 ENCOUNTER — Other Ambulatory Visit: Payer: Self-pay

## 2019-12-13 MED ORDER — FENTANYL 25 MCG/HR TD PT72: 1.0000 | MEDICATED_PATCH | TRANSDERMAL | 0 refills | Status: DC

## 2019-12-13 NOTE — Telephone Encounter (Signed)
Informed pt patch has been refilled.

## 2019-12-13 NOTE — Telephone Encounter (Signed)
Received call from patient requesting Fentanyl patch refill.  Refill request forwarded to Dr. Irene Limbo.

## 2019-12-16 ENCOUNTER — Encounter: Payer: Self-pay | Admitting: Family Medicine

## 2019-12-18 ENCOUNTER — Ambulatory Visit: Payer: Medicare HMO

## 2019-12-25 ENCOUNTER — Other Ambulatory Visit: Payer: Self-pay | Admitting: Hematology

## 2019-12-25 NOTE — Telephone Encounter (Signed)
Patient request refill

## 2019-12-27 ENCOUNTER — Other Ambulatory Visit: Payer: Self-pay

## 2019-12-27 ENCOUNTER — Inpatient Hospital Stay: Payer: Medicare HMO

## 2019-12-27 DIAGNOSIS — C9000 Multiple myeloma not having achieved remission: Secondary | ICD-10-CM | POA: Diagnosis not present

## 2019-12-27 DIAGNOSIS — D61818 Other pancytopenia: Secondary | ICD-10-CM | POA: Diagnosis not present

## 2019-12-27 DIAGNOSIS — M549 Dorsalgia, unspecified: Secondary | ICD-10-CM | POA: Diagnosis not present

## 2019-12-27 DIAGNOSIS — G893 Neoplasm related pain (acute) (chronic): Secondary | ICD-10-CM

## 2019-12-27 DIAGNOSIS — Z79899 Other long term (current) drug therapy: Secondary | ICD-10-CM | POA: Diagnosis not present

## 2019-12-27 DIAGNOSIS — M8580 Other specified disorders of bone density and structure, unspecified site: Secondary | ICD-10-CM | POA: Diagnosis not present

## 2019-12-27 DIAGNOSIS — Z87891 Personal history of nicotine dependence: Secondary | ICD-10-CM | POA: Diagnosis not present

## 2019-12-27 DIAGNOSIS — K5900 Constipation, unspecified: Secondary | ICD-10-CM | POA: Diagnosis not present

## 2019-12-27 DIAGNOSIS — Z5111 Encounter for antineoplastic chemotherapy: Secondary | ICD-10-CM

## 2019-12-27 DIAGNOSIS — I10 Essential (primary) hypertension: Secondary | ICD-10-CM | POA: Diagnosis not present

## 2019-12-27 DIAGNOSIS — I700 Atherosclerosis of aorta: Secondary | ICD-10-CM | POA: Diagnosis not present

## 2019-12-27 DIAGNOSIS — Z7982 Long term (current) use of aspirin: Secondary | ICD-10-CM | POA: Diagnosis not present

## 2019-12-27 LAB — CBC WITH DIFFERENTIAL/PLATELET
Abs Immature Granulocytes: 0.02 10*3/uL (ref 0.00–0.07)
Basophils Absolute: 0.1 10*3/uL (ref 0.0–0.1)
Basophils Relative: 1 %
Eosinophils Absolute: 0.1 10*3/uL (ref 0.0–0.5)
Eosinophils Relative: 1 %
HCT: 37.7 % (ref 36.0–46.0)
Hemoglobin: 12 g/dL (ref 12.0–15.0)
Immature Granulocytes: 1 %
Lymphocytes Relative: 9 %
Lymphs Abs: 0.4 10*3/uL — ABNORMAL LOW (ref 0.7–4.0)
MCH: 32.9 pg (ref 26.0–34.0)
MCHC: 31.8 g/dL (ref 30.0–36.0)
MCV: 103.3 fL — ABNORMAL HIGH (ref 80.0–100.0)
Monocytes Absolute: 0.5 10*3/uL (ref 0.1–1.0)
Monocytes Relative: 11 %
Neutro Abs: 3.3 10*3/uL (ref 1.7–7.7)
Neutrophils Relative %: 77 %
Platelets: 208 10*3/uL (ref 150–400)
RBC: 3.65 MIL/uL — ABNORMAL LOW (ref 3.87–5.11)
RDW: 13.1 % (ref 11.5–15.5)
WBC: 4.3 10*3/uL (ref 4.0–10.5)
nRBC: 0 % (ref 0.0–0.2)

## 2019-12-27 LAB — CMP (CANCER CENTER ONLY)
ALT: 17 U/L (ref 0–44)
AST: 18 U/L (ref 15–41)
Albumin: 4.5 g/dL (ref 3.5–5.0)
Alkaline Phosphatase: 36 U/L — ABNORMAL LOW (ref 38–126)
Anion gap: 8 (ref 5–15)
BUN: 14 mg/dL (ref 8–23)
CO2: 30 mmol/L (ref 22–32)
Calcium: 9.7 mg/dL (ref 8.9–10.3)
Chloride: 105 mmol/L (ref 98–111)
Creatinine: 0.72 mg/dL (ref 0.44–1.00)
GFR, Est AFR Am: >60 mL/min (ref >60)
GFR, Estimated: >60 mL/min (ref >60)
Glucose, Bld: 103 mg/dL — ABNORMAL HIGH (ref 70–99)
Potassium: 4.3 mmol/L (ref 3.5–5.1)
Sodium: 143 mmol/L (ref 135–145)
Total Bilirubin: 0.3 mg/dL (ref 0.3–1.2)
Total Protein: 6.9 g/dL (ref 6.5–8.1)

## 2019-12-27 MED FILL — NINLARO 3 MG CAPS: 3 | 28 days supply | Qty: 3 | Fill #0

## 2019-12-28 ENCOUNTER — Telehealth: Payer: Self-pay

## 2019-12-28 NOTE — Telephone Encounter (Signed)
Patient requesting refill for her oxyCODONE-acetaminophen 5-325 MG tablet.  Request routed to MD.

## 2020-01-02 NOTE — Progress Notes (Signed)
HEMATOLOGY/ONCOLOGY CLINIC NOTE  Date of Service: 01/03/2020  Patient Care Team: [name], MD as PCP - General (Family Medicine) [name], MD as Consulting Physician (Hematology) Bond, [name], MD as Referring Physician (Ophthalmology) [name], [name], MD as Consulting Physician (Hematology and Oncology)  Dr. Ronney Lion as [place] Specialist  [phone]  CHIEF COMPLAINTS/PURPOSE OF CONSULTATION:   Multiple Myeloma- continued management  HISTORY OF PRESENTING ILLNESS:   Megan Olson is a wonderful 79 y.o. female who has been referred to [place] by Dr. 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 She is accompanied today by her husband. The pt reports that she is doing well overall.   The pt notes that she was doing extensive yard work in October 2019, and began to feel back pain a few days after this, which she attributed to muscle pain. She recalls taking a deep breath, and developing sudden acute pain, and presented to care with her PCP's office. She began exercises for her back pain, without relief, then began PT without relief, then was referred to Dr. Paulla Fore in sports medicine in late January 2020. She had an XR which revealed a compression fracture at T11, then subsequently had an MRI, and a bone marrow biopsy. The pt notes that her back pain "seemed to move around." She endorses pain "up the whole left side" of her back.  The pt notes that she is not able to stand up straight due to her back pain, which she feels limits her ability to take a deep breath, and endorses pain exacerbation when she does take a deep breath. The pt needs assistance from sitting to lying from her husband. She is using about 3 Percocet a day.  The pt notes worsened constipation since beginning Percocet, and notes that her most recent laxative order was not covered by her insurance. She is using prune juice and milk  of magnesia. She took Senokot S BID without relief.  The pt reports that prior to her recent back pain, she had very few medical concerns. She endorses controlled BP, and has been monitored for DM but after closely watching her diet her A1C decreased to 5.8. She has glaucoma, and has had surgery in both eyes. Right eye with stent. The pt sees Dr. Edilia Bo at [facility] for her eye care. The pt notes that her vision has been recently "pretty good." The pt notes that she has been advised to limit treatment with steroids for her glaucoma. She denies heart or lung problems. Denies previous back problems. Last DEXA scan 3 years ago, and endorses osteopenia. She notes that she took Fosamax for 3-4 years, and stopped 5-6 years ago. She takes Vitamin D, a multivitamin, and magnesium.  The pt notes that she quit smoking cigarettes when she was 27, started when she was about 20. The pt consumes alcohol rarely, but not since beginning narcotics. 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  Of note prior to the patient's visit today, pt has had an MRI Thoracic Spine completed on 11/27/18 with results revealing Multifocal marrow signal abnormality consistent with metastatic disease or multiple myeloma. The patient has multiple compression fractures most consistent with pathologic injuries. Extensive marrow signal abnormality makes determining age of the fractures difficult but edema is most intense in T3. 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  Most recent  lab results (12/01/18) of CBC w/diff is as follows: all values are WNL except for RBC at 3.17, HGB at 10.4, HCT at 33.5, MCV at 105.7. 11/28/18 CMP revealed all values WNL except for Glucose at 105, Total Protein at 10.2, Albumin at 3.1 11/30/18 24HR UPEP revealed all values WNL except for M-spike at 75.1%. 11/28/18 Bega-2 microglobulin slightly elevated at 2.6 11/28/18 SPEP revealed  M spike at 4.6g 11/28/18 Immunoglobulins revealed IgG at 6181, IgA at 24, IgM at 16, and IgE at 6.  On review of systems, pt reports constipation, back pain, stable energy levels, ankle swelling, tenderness at T3, lower back pain, and denies abdominal pains, neck pain, and any other symptoms.   On PMHx the pt reports redundant colon, single hemorrhoid, glaucoma, HTN, osteopenia, Tonsillectomy, right eye stent and multiple eye surgeries. On Social Hx the pt reports rare alcohol use, smoked cigarettes between ages 56 and 52. 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 On Family Hx the pt reports sister died from small cell lung cancer and was a lifelong smoker, brother's daughter died of breast cancer with BRCA1 and BRCA2 mutations. Cousin with female breast cancer.   INTERVAL HISTORY:   Megan Olson returns today for management and evaluation of her multiple myeloma. The patient's last visit with [place] was on 12/06/19. The pt reports that she is doing well overall.  The pt reports she is good. Pt has gotten both doses of the COVID19 vaccine. She still has tingling in right arm and has found stretches to help. Pt is wanting to find more social activities once people are vaccinated.   Lab results today (12/27/19) of CBC w/diff and CMP is as follows: all values are WNL except for RBC at 3.65, MCV at 103.3, Lymph's Abs at 0.4K, Glucose at 103, Alkaline Phosphatase at 36 .  On review of systems, pt reports healthy appetite and denies nausea, vomiting, diarrhea, skin rashes  and any other symptoms.    MEDICAL HISTORY:  Past Medical History:  Diagnosis Date  . Cancer ([place])    multiple myeloma  . Glaucoma   . HYPERTENSION 03/11/2007  . OSTEOPENIA 03/11/2007    SURGICAL HISTORY: Past Surgical History:  Procedure Laterality Date  . BREAST EXCISIONAL BIOPSY Right 2000  . BREAST LUMPECTOMY  1990   benign  . DILATION AND CURETTAGE OF UTERUS     bleeding at menopause. No uterine cancer  . IR RADIOLOGIST EVAL &  MGMT  12/13/2018  . TONSILLECTOMY     age 73    SOCIAL HISTORY: Social History   Socioeconomic History  . Marital status: Married    Spouse name: Not on file  . Number of children: Not on file  . Years of education: Not on file  . Highest education level: Not on file  Occupational History  . Not on file  Tobacco Use  . Smoking status: Former Smoker    Packs/day: 0.50    Years: 7.00    Pack years: 3.50    Types: Cigarettes    Quit date: 01/04/1961    Years since quitting: 59.0  . Smokeless tobacco: Never Used  Substance and Sexual Activity  . Alcohol use: Yes    Alcohol/week: 1.0 standard drinks    Types: 1 Standard drinks or equivalent per week  . Drug use: No  . Sexual activity: Not Currently  Other Topics Concern  . Not on file  Social History Narrative   Married. Lives with husband (patient of Dr. Yong Channel). 1 son. No  grandkids. 1 granddog.       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 Difficulty of Paying Living Expenses:   Food Insecurity:   . Worried About Charity fundraiser in the Last Year:   . Arboriculturist in the Last Year:   Transportation Needs:   . Film/video editor (Medical):   Marland Kitchen Lack of Transportation (Non-Medical):   Physical Activity:   . Days of Exercise per Week:   . Minutes of Exercise per Session:   Stress:   . Feeling of Stress :   Social Connections:   . Frequency of Communication with Friends and Family:   . Frequency of Social Gatherings with Friends and Family:   . Attends Religious Services:   . Active Member of Clubs or Organizations:   . Attends Archivist Meetings:   Marland Kitchen Marital Status:   Intimate Partner Violence:   . Fear of Current or Ex-Partner:   . Emotionally Abused:   Marland Kitchen Physically Abused:   . Sexually Abused:     FAMILY  HISTORY: Family History  Problem Relation Age of Onset  . Heart disease Mother        CHF mother died 73  . Arthritis Mother   . Glaucoma Mother        sister as well  . Alcohol abuse Father   . Suicidality Father   . Cancer Sister        lung cancer, smoker  . Heart disease Sister        aortic valve replacement  . Hyperlipidemia Brother   . Hypertension Brother   . COPD Brother   . Arthritis Sister   . Hypertension Sister   . Glaucoma Sister   . Hashimoto's thyroiditis Sister   . Hypertension Son   . Stroke Maternal Grandmother   . Colon cancer Neg Hx   . Colon polyps Neg Hx     ALLERGIES:  is allergic to ace inhibitors; benadryl [diphenhydramine]; diamox [acetazolamide]; sulfamethoxazole; lenalidomide; and penicillins.   MEDICATIONS:  Current Outpatient Medications  Medication Sig Dispense Refill  . acyclovir (ZOVIRAX) 200 MG capsule Take 200 mg by mouth 2 (two) times daily.    . ALPHAGAN P 0.1 % SOLN Apply 1 drop topically See admin instructions. Apply 2 drops in right eye 3 times a day    . amLODipine (NORVASC) 5 MG tablet Take 1 tablet (5 mg total) by mouth daily. 90 tablet 3  . aspirin EC 81 MG tablet Take 81 mg by mouth daily.    . bimatoprost (LUMIGAN) 0.03 % ophthalmic solution Place 1 drop into the right eye at bedtime.     . Cholecalciferol (VITAMIN D3) 1000 UNITS CAPS Take 3,000 Units by mouth daily.     Marland Kitchen co-enzyme Q-10 50 MG capsule Take 50 mg by mouth daily.      . dorzolamide-timolol (COSOPT) 22.3-6.8 MG/ML ophthalmic solution Place 2 drops into both eyes 2 (two) times daily.   11  . famotidine (PEPCID) 10 MG tablet Take 10 mg by mouth 2 (two) times daily.    . fentaNYL (DURAGESIC) 25 MCG/HR Place 1 patch onto the skin every 3 (three) days. 10 patch 0  . hydrOXYzine (ATARAX/VISTARIL) 25 MG tablet Take 1 tablet (25 mg total) by mouth  3 (three) times daily as needed. 30 tablet 0  . magnesium hydroxide (MILK OF MAGNESIA) 400 MG/5ML suspension Take 15 mLs by  mouth daily as needed for mild constipation or moderate constipation. 355 mL 0  . Magnesium Oxide 500 MG TABS Take 1 tablet by mouth daily.      . Multiple Vitamins-Minerals (MULTIVITAMIN WITH MINERALS) tablet Take 1 tablet by mouth daily.      Marland Kitchen NINLARO 3 MG capsule TAKE 1 CAP (3 MG) BY MOUTH WEEKLY, 3 WEEKS ON, 1 WEEK OFF, REPEAT EVERY 4 WEEKS. TAKE ON AN EMPTY STOMACH 1HR BEFORE OR 2HR AFTER MEAL 3 capsule 2  . oxyCODONE-acetaminophen (PERCOCET) 5-325 MG tablet Take 1-2 tablets by mouth every 4 (four) hours as needed for moderate pain or severe pain. 90 tablet 0  . polyethylene glycol (MIRALAX) packet Take 17 g by mouth daily. 30 each 1  . senna-docusate (SENOKOT-S) 8.6-50 MG tablet Take 2 tablets by mouth at bedtime. 60 tablet 2  . vitamin C (ASCORBIC ACID) 500 MG tablet Take 500 mg by mouth daily.     No current facility-administered medications for this visit.    REVIEW OF SYSTEMS:   A 10+ POINT REVIEW OF SYSTEMS WAS OBTAINED including neurology, dermatology, psychiatry, cardiac, respiratory, lymph, extremities, GI, GU, Musculoskeletal, constitutional, breasts, reproductive, HEENT.  All pertinent positives are noted in the HPI.  All others are negative.   PHYSICAL EXAMINATION: ECOG PERFORMANCE STATUS: 1-2  Vitals:   01/03/20 1519 01/03/20 1522  BP: (!) 193/95 (!) 185/93  Pulse: 69   Resp: 17   Temp: 98.7 F (37.1 C)   SpO2: 98%    Filed Weights   01/03/20 1519  Weight: 108 lb (49 kg)   Body mass index is 19.44 kg/m.   Exam was given in a chair   GENERAL:alert, in no acute distress and comfortable SKIN: no acute rashes, no significant lesions EYES: conjunctiva are pink and non-injected, sclera anicteric OROPHARYNX: MMM, no exudates, no oropharyngeal erythema or ulceration NECK: supple, no JVD LYMPH:  no palpable lymphadenopathy in the cervical, axillary or inguinal regions LUNGS: clear to auscultation b/l with normal respiratory effort HEART: regular rate &  rhythm ABDOMEN:  normoactive bowel sounds , non tender, not distended. Extremity: no pedal edema PSYCH: alert & oriented x 3 with fluent speech NEURO: no focal motor/sensory deficits  LABORATORY DATA:  I have reviewed the data as listed  . CBC Latest Ref Rng & Units 12/27/2019 12/06/2019 11/08/2019  WBC 4.0 - 10.5 K/uL 4.3 2.4(L) 3.4(L)  Hemoglobin 12.0 - 15.0 g/dL 12.0 11.4(L) 12.3  Hematocrit 36.0 - 46.0 % 37.7 35.8(L) 37.2  Platelets 150 - 400 K/uL 208 182 199    . CMP Latest Ref Rng & Units 12/27/2019 12/06/2019 11/08/2019  Glucose 70 - 99 mg/dL 103(H) 152(H) 100(H)  BUN 8 - 23 mg/dL '14 15 15  '$ Creatinine 0.44 - 1.00 mg/dL 0.72 0.85 0.69  Sodium 135 - 145 mmol/L 143 144 142  Potassium 3.5 - 5.1 mmol/L 4.3 4.0 4.2  Chloride 98 - 111 mmol/L 105 104 106  CO2 22 - 32 mmol/L '30 31 29  '$ Calcium 8.9 - 10.3 mg/dL 9.7 9.4 9.3  Total Protein 6.5 - 8.1 g/dL 6.9 6.7 6.6  Total Bilirubin 0.3 - 1.2 mg/dL 0.3 <0.2(L) 0.4  Alkaline Phos 38 - 126 U/L 36(L) 38 37(L)  AST 15 - 41 U/L '18 19 15  '$ ALT 0 - 44 U/L '17 15 15   '$ 09/11/2019 Flow Pathology Report (  [id number]):   09/11/2019 Bone Marrow Report [id number]):    07/14/2019 Cytogenetics [phone]):    07/14/2019 Flow Pathology Report [id number]):   07/14/2019 BM Bx [phone]):   Most recent MMP 04/25/2019 Component     Latest Ref Rng & Units 04/25/2019  IgG (Immunoglobin G), Serum     586 - 1,602 mg/dL 702  IgA     64 - 422 mg/dL 39 (L)  IgM (Immunoglobulin M), Srm     26 - 217 mg/dL 26  Total Protein ELP     6.0 - 8.5 g/dL 5.9 (L)  Albumin SerPl Elph-Mcnc     2.9 - 4.4 g/dL 3.8  Alpha 1     0.0 - 0.4 g/dL 0.2  Alpha2 Glob SerPl Elph-Mcnc     0.4 - 1.0 g/dL 0.7  B-Globulin SerPl Elph-Mcnc     0.7 - 1.3 g/dL 1.0  Gamma Glob SerPl Elph-Mcnc     0.4 - 1.8 g/dL 0.3 (L)  M Protein SerPl Elph-Mcnc     Not Observed g/dL 0.4 (H)  Globulin, Total     2.2 - 3.9 g/dL 2.1 (L)  Albumin/Glob SerPl     0.7 - 1.7 1.9 (H)   IFE 1      Comment  Please Note (HCV):      Comment    12/01/18 BM Bx:   12/01/18 Flow Cytometry:       RADIOGRAPHIC STUDIES: I have personally reviewed the radiological images as listed and agreed with the findings in the report. No results found.   ASSESSMENT & PLAN:  79 y.o. female with  1. Multiple Myeloma with IgG Lambda specificity Labs upon initial presentation from 12/01/18 CBC w/diff revealed HGB at 10.4 with MCV of 105.7. 11/28/18 CMP revealed Creatinine normal at 0.68 and Calcium normal at 8.9. 11/28/18 Beta 2 microglobulin at 2.'6mg'$  (also a reading at 4.'7mg'$  on the same day). 11/30/18 24HR UPEP revealed M spike at 75.1%. 11/28/18 SPEP revealed M spike of 4.6g. 11/28/18 Immunoglobulins revealed IgG elevated at '6181mg'$ .  12/01/18 BM Bx revealed hypercellular bone marrow with 70-80% CD138 immunohistochemistry (44% aspirate) lambda-restricted plasma cells as well as a kappa restricted population of B-cells 11/27/18 MRI Thoracic Spine which revealed Multifocal marrow signal abnormality consistent with metastatic disease or multiple myeloma. The patient has multiple compression fractures most consistent with pathologic injuries. Extensive marrow signal abnormality makes determining age of the fractures difficult but edema is most intense in T3. 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 2. Areas of hypermetabolic disease identified in both lungs suggesting multiple myeloma involvement. 3. 1.9 cm calcified left thyroid nodule is hypermetabolic, but Indeterminate. 4. Colon is diffusely distended with gas and stool. Imaging features would be  compatible with clinical constipation. 5.  Aortic Atherosclerois.  Pt describes grade II to III rash on her re-challenge from Revlimid with optimized pre-medications, and we discontinued Revlimid  Began Cytoxan with C4 Velcade and Dexamethasone  05/16/2019 M spike is down to 0.2g/dl showing continued improvement and a 90% reduction  -07/14/2019 BM Bx revealed BONE MARROW: - Hypercellular marrow with residual plasma cell neoplasm (<10%) PERIPHERAL BLOOD: - Pancytopenia".   07/17/2019 PET/CT Whole Body Scan ([id number]) revealed "1.  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 2. Hypermetabolic calcified left thyroid nodule, as before, indeterminate. 3. Aortic atherosclerosis (ICD10-170.0). Coronary artery calcification."  09/11/2019 Bone Marrow Report ([id number]) revealed "BONE MARROW, ASPIRATE, CLOT, CORE:  -Variably cellular bone marrow with trilineage hematopoiesis and 2% plasma cells PERIPHERAL BLOOD:  -Pancytopenia".  09/11/2019 Flow Pathology Report [id number]) revealed "-Predominance of T lymphocytes with relative abundance of CD8 positive cells  -No significant B-cell population identified -Relative abundance of natural killer cells".  09/14/2019 PET/CT ([id number]) revealed "1. No findings of active malignancy. 2. Numerous thoracolumbar compression fractures many of which may be associated with osteoporosis. 3. Calcified 1.5 cm in diameter left thyroid nodule with persistent elevated activity. A significant minority of hypermetabolic thyroid nodules can be malignant, if not previously worked up to then thyroid ultrasound would be recommended as a next step. 4. There a few small lucent and small sclerotic lesions in the skeleton which are not hypermetabolic, possibilities include benign lesions or previously effectively treated myeloma. 5. Other imaging findings of potential clinical significance: Aortic Atherosclerosis (ICD10-I70.0).  Coronary atherosclerosis. Prominent stool throughout the colon favors constipation. Hyperdense right renal lesion is probably a complex cyst but technically nonspecific."  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 She does not currently have elevated lymphocyte counts on peripheral blood draws.   PLAN: -Discussed pt labwork today, 12/27/19; of CBC w/diff and CMP is as follows: all values are WNL except for RBC at 3.65, MCV at 103.3, Lymph's Abs at 0.4K, Glucose at 103, Alkaline Phosphatase at 36 . -Advised pt can get shingrix vaccine -f/u with PCP -Advised on LLS and International working group on multiple myeloma for education  -Advised she can be around vaccinated people for social gathering  -No lab or clinical progression of Multiple Myeloma or Monoclonal B-cell Lymphocytosis at this time -The pt has no prohibitive toxicities from continuing Ninlaro at this time -Recommend pt avoid pushing or pulling anything heavy and being careful when twisting or bending -Continue 3000IU Vitamin D daily  -Will see back in 2 months with labs    FOLLOW UP: RTC with Dr Irene Limbo with labs and Zometa infusion in 8 weeks Please schedule the lab appointment 1 week prior to clinic visit    The total time spent in the appt was 20 minutes and more than 50% was on counseling and direct patient cares.  All of the patient's questions were answered with apparent satisfaction. The patient knows to call the clinic with any problems, questions or concerns.    Sullivan Lone MD MS AAHIVMS [facility] Hematology/Oncology Physician [facility]  (Office):       [phone] (Work cell):  [phone] (Fax):           [phone]  01/03/2020 3:36 PM  I, Dawayne Cirri am acting as a Education administrator for Dr. Sullivan Lone.   .I have  reviewed the above documentation for accuracy and completeness, and I agree with the above. [name] MD

## 2020-01-03 ENCOUNTER — Inpatient Hospital Stay: Payer: Medicare HMO | Admitting: Hematology

## 2020-01-03 ENCOUNTER — Other Ambulatory Visit: Payer: Self-pay

## 2020-01-03 ENCOUNTER — Inpatient Hospital Stay: Payer: Medicare HMO

## 2020-01-03 ENCOUNTER — Other Ambulatory Visit: Payer: Medicare HMO

## 2020-01-03 VITALS — BP 185/93 | HR 69 | Temp 98.7°F | Resp 17 | Ht 62.5 in | Wt 108.0 lb

## 2020-01-03 DIAGNOSIS — D61818 Other pancytopenia: Secondary | ICD-10-CM | POA: Diagnosis not present

## 2020-01-03 DIAGNOSIS — I10 Essential (primary) hypertension: Secondary | ICD-10-CM | POA: Diagnosis not present

## 2020-01-03 DIAGNOSIS — C9000 Multiple myeloma not having achieved remission: Secondary | ICD-10-CM

## 2020-01-03 DIAGNOSIS — M8580 Other specified disorders of bone density and structure, unspecified site: Secondary | ICD-10-CM | POA: Diagnosis not present

## 2020-01-03 DIAGNOSIS — Z7189 Other specified counseling: Secondary | ICD-10-CM

## 2020-01-03 DIAGNOSIS — K5900 Constipation, unspecified: Secondary | ICD-10-CM | POA: Diagnosis not present

## 2020-01-03 DIAGNOSIS — Z79899 Other long term (current) drug therapy: Secondary | ICD-10-CM | POA: Diagnosis not present

## 2020-01-03 DIAGNOSIS — Z87891 Personal history of nicotine dependence: Secondary | ICD-10-CM | POA: Diagnosis not present

## 2020-01-03 DIAGNOSIS — Z7982 Long term (current) use of aspirin: Secondary | ICD-10-CM | POA: Diagnosis not present

## 2020-01-03 DIAGNOSIS — I700 Atherosclerosis of aorta: Secondary | ICD-10-CM | POA: Diagnosis not present

## 2020-01-03 DIAGNOSIS — M549 Dorsalgia, unspecified: Secondary | ICD-10-CM | POA: Diagnosis not present

## 2020-01-03 MED ORDER — SODIUM CHLORIDE 0.9 % IV SOLN
Freq: Once | INTRAVENOUS | Status: AC
  Filled 2020-01-03: qty 250

## 2020-01-03 MED ORDER — ZOLEDRONIC ACID 4 MG/100ML IV SOLN
4.0000 mg | Freq: Once | INTRAVENOUS | Status: AC
  Administered 2020-01-03: 4 mg via INTRAVENOUS

## 2020-01-03 MED ORDER — ZOLEDRONIC ACID 4 MG/100ML IV SOLN
INTRAVENOUS | Status: AC
  Filled 2020-01-03: qty 100

## 2020-01-03 NOTE — Patient Instructions (Signed)
Zoledronic Acid injection (Hypercalcemia, Oncology) What is this medicine? ZOLEDRONIC ACID (ZOE le dron ik AS id) lowers the amount of calcium loss from bone. It is used to treat too much calcium in your blood from cancer. It is also used to prevent complications of cancer that has spread to the bone. This medicine may be used for other purposes; ask your health care provider or pharmacist if you have questions. COMMON BRAND NAME(S): Zometa What should I tell my health care provider before I take this medicine? They need to know if you have any of these conditions:  aspirin-sensitive asthma  cancer, especially if you are receiving medicines used to treat cancer  dental disease or wear dentures  infection  kidney disease  receiving corticosteroids like dexamethasone or prednisone  an unusual or allergic reaction to zoledronic acid, other medicines, foods, dyes, or preservatives  pregnant or trying to get pregnant  breast-feeding How should I use this medicine? This medicine is for infusion into a vein. It is given by a health care professional in a hospital or clinic setting. Talk to your pediatrician regarding the use of this medicine in children. Special care may be needed. Overdosage: If you think you have taken too much of this medicine contact a poison control center or emergency room at once. NOTE: This medicine is only for you. Do not share this medicine with others. What if I miss a dose? It is important not to miss your dose. Call your doctor or health care professional if you are unable to keep an appointment. What may interact with this medicine?  certain antibiotics given by injection  NSAIDs, medicines for pain and inflammation, like ibuprofen or naproxen  some diuretics like bumetanide, furosemide  teriparatide  thalidomide This list may not describe all possible interactions. Give your health care provider a list of all the medicines, herbs, non-prescription  drugs, or dietary supplements you use. Also tell them if you smoke, drink alcohol, or use illegal drugs. Some items may interact with your medicine. What should I watch for while using this medicine? Visit your doctor or health care professional for regular checkups. It may be some time before you see the benefit from this medicine. Do not stop taking your medicine unless your doctor tells you to. Your doctor may order blood tests or other tests to see how you are doing. Women should inform their doctor if they wish to become pregnant or think they might be pregnant. There is a potential for serious side effects to an unborn child. Talk to your health care professional or pharmacist for more information. You should make sure that you get enough calcium and vitamin D while you are taking this medicine. Discuss the foods you eat and the vitamins you take with your health care professional. Some people who take this medicine have severe bone, joint, and/or muscle pain. This medicine may also increase your risk for jaw problems or a broken thigh bone. Tell your doctor right away if you have severe pain in your jaw, bones, joints, or muscles. Tell your doctor if you have any pain that does not go away or that gets worse. Tell your dentist and dental surgeon that you are taking this medicine. You should not have major dental surgery while on this medicine. See your dentist to have a dental exam and fix any dental problems before starting this medicine. Take good care of your teeth while on this medicine. Make sure you see your dentist for regular follow-up   appointments. What side effects may I notice from receiving this medicine? Side effects that you should report to your doctor or health care professional as soon as possible:  allergic reactions like skin rash, itching or hives, swelling of the face, lips, or tongue  anxiety, confusion, or depression  breathing problems  changes in vision  eye  pain  feeling faint or lightheaded, falls  jaw pain, especially after dental work  mouth sores  muscle cramps, stiffness, or weakness  redness, blistering, peeling or loosening of the skin, including inside the mouth  trouble passing urine or change in the amount of urine Side effects that usually do not require medical attention (report to your doctor or health care professional if they continue or are bothersome):  bone, joint, or muscle pain  constipation  diarrhea  fever  hair loss  irritation at site where injected  loss of appetite  nausea, vomiting  stomach upset  trouble sleeping  trouble swallowing  weak or tired This list may not describe all possible side effects. Call your doctor for medical advice about side effects. You may report side effects to FDA at 1-800-FDA-1088. Where should I keep my medicine? This drug is given in a hospital or clinic and will not be stored at home. NOTE: This sheet is a summary. It may not cover all possible information. If you have questions about this medicine, talk to your doctor, pharmacist, or health care provider.  2020 Elsevier/Gold Standard (2014-02-17 14:19:39)  Coronavirus (COVID-19) Are you at risk?  Are you at risk for the Coronavirus (COVID-19)?  To be considered HIGH RISK for Coronavirus (COVID-19), you have to meet the following criteria:  . Traveled to [place], [place], [place], [place] or [place]; or in the [place] to [place], [place], [place], or [place]; and have fever, cough, and shortness of breath within the last 2 weeks of travel OR . Been in close contact with a person diagnosed with COVID-19 within the last 2 weeks and have fever, cough, and shortness of breath . IF YOU DO NOT MEET THESE CRITERIA, YOU ARE CONSIDERED LOW RISK FOR COVID-19.  What to do if you are HIGH RISK for COVID-19?  . If you are having a medical emergency, call 911. . Seek medical care right away. Before you go  to a doctor's office, urgent care or emergency department, call ahead and tell them about your recent travel, contact with someone diagnosed with COVID-19, and your symptoms. You should receive instructions from your physician's office regarding next steps of care.  . When you arrive at healthcare provider, tell the healthcare staff immediately you have returned from visiting [place], [place], [place], [place] or [place]; or traveled in the [place] to [place], [place], [place], or [place]; in the last two weeks or you have been in close contact with a person diagnosed with COVID-19 in the last 2 weeks.   . Tell the health care staff about your symptoms: fever, cough and shortness of breath. . After you have been seen by a medical provider, you will be either: o Tested for (COVID-19) and discharged home on quarantine except to seek medical care if symptoms worsen, and asked to  - Stay home and avoid contact with others until you get your results (4-5 days)  - Avoid travel on public transportation if possible (such as bus, train, or airplane) or o Sent to the Emergency Department by EMS for evaluation, COVID-19 testing, and possible admission depending   on your condition and test results.  What to do if you are LOW RISK for COVID-19?  Reduce your risk of any infection by using the same precautions used for avoiding the common cold or flu:  . Wash your hands often with soap and warm water for at least 20 seconds.  If soap and water are not readily available, use an alcohol-based hand sanitizer with at least 60% alcohol.  . If coughing or sneezing, cover your mouth and nose by coughing or sneezing into the elbow areas of your shirt or coat, into a tissue or into your sleeve (not your hands). . Avoid shaking hands with others and consider head nods or verbal greetings only. . Avoid touching your eyes, nose, or mouth with unwashed hands.  . Avoid close contact with people who are sick. . Avoid  places or events with large numbers of people in one location, like concerts or sporting events. . Carefully consider travel plans you have or are making. . If you are planning any travel outside or inside the [place], visit the CDC's Travelers' Health webpage for the latest health notices. . If you have some symptoms but not all symptoms, continue to monitor at home and seek medical attention if your symptoms worsen. . If you are having a medical emergency, call 911.   ADDITIONAL HEALTHCARE OPTIONS FOR PATIENTS  [facility] / e-Visit: [email or web address]         [facility]: [phone]  [facility]: [phone]                   [facility]: [phone]   

## 2020-01-05 ENCOUNTER — Telehealth: Payer: Self-pay | Admitting: Hematology

## 2020-01-05 NOTE — Telephone Encounter (Signed)
Scheduled per 03/31 los, patient has been called and notified.

## 2020-01-08 LAB — MULTIPLE MYELOMA PANEL, SERUM
Albumin SerPl Elph-Mcnc: 4.2 g/dL (ref 2.9–4.4)
Albumin/Glob SerPl: 1.7 (ref 0.7–1.7)
Alpha 1: 0.3 g/dL (ref 0.0–0.4)
Alpha2 Glob SerPl Elph-Mcnc: 0.7 g/dL (ref 0.4–1.0)
B-Globulin SerPl Elph-Mcnc: 1.2 g/dL (ref 0.7–1.3)
Gamma Glob SerPl Elph-Mcnc: 0.4 g/dL (ref 0.4–1.8)
Globulin, Total: 2.6 g/dL (ref 2.2–3.9)
IgA: 23 mg/dL — ABNORMAL LOW (ref 64–422)
IgG (Immunoglobin G), Serum: 517 mg/dL — ABNORMAL LOW (ref 586–1602)
IgM (Immunoglobulin M), Srm: 21 mg/dL — ABNORMAL LOW (ref 26–217)
M Protein SerPl Elph-Mcnc: 0.2 g/dL — ABNORMAL HIGH
Total Protein ELP: 6.8 g/dL (ref 6.0–8.5)

## 2020-01-15 ENCOUNTER — Other Ambulatory Visit: Payer: Self-pay | Admitting: *Deleted

## 2020-01-15 MED ORDER — FENTANYL 25 MCG/HR TD PT72: 1.0000 | MEDICATED_PATCH | TRANSDERMAL | 0 refills | Status: DC

## 2020-01-15 NOTE — Telephone Encounter (Signed)
patient requested refill of fentanyl before end of week

## 2020-01-23 MED FILL — NINLARO 3 MG CAPS: 3 | 28 days supply | Qty: 3 | Fill #1

## 2020-01-26 ENCOUNTER — Other Ambulatory Visit: Payer: Self-pay

## 2020-01-26 ENCOUNTER — Encounter: Payer: Self-pay | Admitting: Family Medicine

## 2020-01-26 ENCOUNTER — Ambulatory Visit (INDEPENDENT_AMBULATORY_CARE_PROVIDER_SITE_OTHER): Payer: Medicare HMO | Admitting: Family Medicine

## 2020-01-26 VITALS — BP 180/90 | HR 78 | Temp 98.7°F | Ht 62.5 in | Wt 107.6 lb

## 2020-01-26 DIAGNOSIS — E785 Hyperlipidemia, unspecified: Secondary | ICD-10-CM | POA: Diagnosis not present

## 2020-01-26 DIAGNOSIS — R739 Hyperglycemia, unspecified: Secondary | ICD-10-CM

## 2020-01-26 DIAGNOSIS — R202 Paresthesia of skin: Secondary | ICD-10-CM | POA: Diagnosis not present

## 2020-01-26 DIAGNOSIS — Z0000 Encounter for general adult medical examination without abnormal findings: Secondary | ICD-10-CM | POA: Diagnosis not present

## 2020-01-26 DIAGNOSIS — C9000 Multiple myeloma not having achieved remission: Secondary | ICD-10-CM

## 2020-01-26 DIAGNOSIS — I10 Essential (primary) hypertension: Secondary | ICD-10-CM | POA: Diagnosis not present

## 2020-01-26 DIAGNOSIS — M810 Age-related osteoporosis without current pathological fracture: Secondary | ICD-10-CM

## 2020-01-26 MED ORDER — AMLODIPINE BESYLATE 5 MG PO TABS: 5.0000 mg | ORAL_TABLET | Freq: Every day | ORAL | 3 refills | Status: DC

## 2020-01-26 NOTE — Patient Instructions (Addendum)
Take half tablet of amlodipine 5 mg tomorrow morning and then another half tablet before bed-the following day so Sunday take the full 5 mg before bed  Please check with your pharmacy to see if they have the shingrix vaccine. If they do- please get this immunization and update [place] by phone call or mychart with dates you receive the vaccine  Recommended follow up: Return in about 6 months (around 07/27/2020) for follow up- or sooner if needed.  Please stop by lab before you go If you have mychart- we will send your results within 3 business days of [place] receiving them.  If you do not have mychart- we will call you about results within 5 business days of [place] receiving them.

## 2020-01-26 NOTE — Progress Notes (Signed)
Phone: [phone]   Subjective:  Patient presents today for their annual physical. Chief complaint-noted.   See problem oriented charting- Review of Systems  Constitutional: Negative for chills and fever.  HENT: Negative for hearing loss and tinnitus.   Eyes: Negative for blurred vision and double vision.  Respiratory: Negative for cough, shortness of breath and wheezing.   Cardiovascular: Negative for chest pain, palpitations and leg swelling.  Gastrointestinal: Negative for blood in stool, constipation, diarrhea, heartburn, nausea and vomiting.  Genitourinary: Negative for dysuria, frequency and urgency.  Musculoskeletal: Positive for back pain. Negative for joint pain and neck pain.       History of compression fracture on going   Skin: Negative for rash.       Having dry skin with treatments for CA   Neurological: Negative for dizziness, weakness and headaches.  Endo/Heme/Allergies: Does not bruise/bleed easily.  Psychiatric/Behavioral: Negative for depression, memory loss and suicidal ideas. The patient does not have insomnia.    The following were reviewed and entered/updated in epic: Past Medical History:  Diagnosis Date  . Cancer ([place])    multiple myeloma  . Glaucoma   . HYPERTENSION 03/11/2007  . OSTEOPENIA 03/11/2007   Patient Active Problem List   Diagnosis Date Noted  . Multiple myeloma not having achieved remission ([place]) 12/12/2018    Priority: High  . Pain in thoracic spine 11/03/2018    Priority: Medium  . Hyperglycemia 03/29/2015    Priority: Medium  . Hyperlipidemia 10/18/2014    Priority: Medium  . Essential hypertension 03/11/2007    Priority: Medium  . Counseling regarding advance care planning and goals of care 12/12/2018    Priority: Low  . History of adenomatous polyp of colon 07/23/2015    Priority: Low  . Raynaud phenomenon 10/19/2014    Priority: Low  . Syncope 10/19/2014    Priority: Low  . Glaucoma 01/05/2011    Priority: Low  .  Osteoporosis 03/11/2007    Priority: Low  . Primary open angle glaucoma (POAG) of right eye, moderate stage 10/11/2017  . Combined forms of age-related cataract of left eye 04/28/2017   Past Surgical History:  Procedure Laterality Date  . BREAST EXCISIONAL BIOPSY Right 2000  . BREAST LUMPECTOMY  1990   benign  . DILATION AND CURETTAGE OF UTERUS     bleeding at menopause. No uterine cancer  . IR RADIOLOGIST EVAL & MGMT  12/13/2018  . TONSILLECTOMY     age 24    Family History  Problem Relation Age of Onset  . Heart disease Mother        CHF mother died 70  . Arthritis Mother   . Glaucoma Mother        sister as well  . Alcohol abuse Father   . Suicidality Father   . Cancer Sister        lung cancer, smoker  . Heart disease Sister        aortic valve replacement  . Hyperlipidemia Brother   . Hypertension Brother   . COPD Brother   . Arthritis Sister   . Hypertension Sister   . Glaucoma Sister   . Hashimoto's thyroiditis Sister   . Hypertension Son   . Stroke Maternal Grandmother   . Colon cancer Neg Hx   . Colon polyps Neg Hx     Medications- reviewed and updated Current Outpatient Medications  Medication Sig Dispense Refill  . acyclovir (ZOVIRAX) 200 MG capsule Take 200 mg by mouth 2 (two) times  daily.    . ALPHAGAN P 0.1 % SOLN Apply 1 drop topically See admin instructions. Apply 2 drops in right eye 3 times a day    . amLODipine (NORVASC) 5 MG tablet Take 1 tablet (5 mg total) by mouth daily. 90 tablet 3  . aspirin EC 81 MG tablet Take 81 mg by mouth daily.    . bimatoprost (LUMIGAN) 0.03 % ophthalmic solution Place 1 drop into the right eye at bedtime.     . Cholecalciferol (VITAMIN D3) 1000 UNITS CAPS Take 3,000 Units by mouth daily.     Marland Kitchen co-enzyme Q-10 50 MG capsule Take 50 mg by mouth daily.      . dorzolamide-timolol (COSOPT) 22.3-6.8 MG/ML ophthalmic solution Place 2 drops into both eyes 2 (two) times daily.   11  . fentaNYL (DURAGESIC) 25 MCG/HR Place 1  patch onto the skin every 3 (three) days. 10 patch 0  . Magnesium Oxide 500 MG TABS Take 1 tablet by mouth daily.      . Multiple Vitamins-Minerals (MULTIVITAMIN WITH MINERALS) tablet Take 1 tablet by mouth daily.      Marland Kitchen NINLARO 3 MG capsule TAKE 1 CAP (3 MG) BY MOUTH WEEKLY, 3 WEEKS ON, 1 WEEK OFF, REPEAT EVERY 4 WEEKS. TAKE ON AN EMPTY STOMACH 1HR BEFORE OR 2HR AFTER MEAL 3 capsule 2  . oxyCODONE-acetaminophen (PERCOCET) 5-325 MG tablet Take 1-2 tablets by mouth every 4 (four) hours as needed for moderate pain or severe pain. 90 tablet 0  . polyethylene glycol (MIRALAX) packet Take 17 g by mouth daily. 30 each 1  . senna-docusate (SENOKOT-S) 8.6-50 MG tablet Take 2 tablets by mouth at bedtime. 60 tablet 2  . vitamin C (ASCORBIC ACID) 500 MG tablet Take 500 mg by mouth daily.    . famotidine (PEPCID) 10 MG tablet Take 10 mg by mouth 2 (two) times daily.    . hydrOXYzine (ATARAX/VISTARIL) 25 MG tablet Take 1 tablet (25 mg total) by mouth 3 (three) times daily as needed. (Patient not taking: Reported on 01/26/2020) 30 tablet 0  . magnesium hydroxide (MILK OF MAGNESIA) 400 MG/5ML suspension Take 15 mLs by mouth daily as needed for mild constipation or moderate constipation. (Patient not taking: Reported on 01/26/2020) 355 mL 0   No current facility-administered medications for this visit.    Allergies-reviewed and updated Allergies  Allergen Reactions  . Ace Inhibitors   . Benadryl [Diphenhydramine] Other (See Comments)    Heart races  . Diamox [Acetazolamide]     Hypotensive event at ophthalmologist  . Sulfamethoxazole     REACTION: rash  . Lenalidomide Rash  . Penicillins Rash    REACTION: rash Did it involve swelling of the face/tongue/throat, SOB, or low BP? Yes Did it involve sudden or severe rash/hives, skin peeling, or any reaction on the inside of your mouth or nose? No Did you need to seek medical attention at a hospital or doctor's office? No When did it last happen?more  than 10 years ago If all above answers are "NO", may proceed with cephalosporin use.     Social History   Social History Narrative   Married. Lives with husband (patient of Dr. Yong Channel). 1 son. No grandkids. 1 granddog.       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 Oropharynx normal Neck: no thyromegaly CV: RRR no murmurs rubs or gallops Lungs: CTAB no crackles, wheeze, rhonchi Abdomen: soft/nontender/nondistended/normal bowel sounds. No rebound or guarding.  Ext: no edema Skin: warm, dry Neuro: grossly normal, moves all extremities, PERRLA   Assessment and Plan   79 y.o. female presenting for annual physical.  Health Maintenance counseling: 1. Anticipatory guidance: Patient counseled regarding regular dental exams -q6 months has not been recently but has appointment in a few weeks. , eye exams -has history of Glaucoma next appointment in June. ,  avoiding smoking and second hand smoke , limiting alcohol to 1 beverage per day-.  Occasionally  2. Risk factor reduction:  Advised patient of need for regular exercise and diet rich and fruits and vegetables to reduce risk of heart attack and stroke. Exercise- -walks a lot , started recently. Just given permission to use  3 pound weights.  . Diet- has been able to gain slight amount of weight which is great- had gotten down to 99 with multiple myeloma.  Wt Readings from Last 3 Encounters:  01/26/20 107 lb 9.6 oz (48.8 kg)  01/03/20 108 lb (49 kg)  12/06/19 108 lb 1.6 oz (49 kg)  3. Immunizations/screenings/ancillary studies- plans to get shingrix at [facility] Immunization History   Administered Date(s) Administered  . Fluad Quad(high Dose 65+) 06/13/2019  . Influenza Split 07/19/2012  . Influenza Whole 07/17/2008, 06/28/2009, 06/25/2010  . Influenza, High Dose Seasonal PF 07/22/2016, 06/24/2017, 06/23/2018  . Influenza,inj,Quad PF,6+ Mos 06/27/2013, 06/18/2014  . Influenza-Unspecified 07/03/2015  . PFIZER SARS-COV-2 Vaccination 11/09/2019, 12/04/2019  . Pneumococcal Conjugate-13 03/29/2015  . Pneumococcal Polysaccharide-23 04/04/2006  . Td 02/10/2010  . Zoster 09/26/2008  4. Cervical cancer screening- past age based screening 5. Breast cancer screening-  Self exams. mammogram - wants to hold off for now.  6. Colon cancer screening - 07/16/15- technically due this October- Id be interested to see Dr. Grier Mitts thoughts.  7. Skin cancer screening- no recent visit. advised regular sunscreen use. Denies worrisome, changing, or new skin lesions.  8. Birth control/STD check- monogomous 9. Osteoporosis screening at 35- osteoporosis managed by oncology -former smoker stopped over 30yr ago. Smoked a ppd for about 6 years . No regulra screening required  Status of chronic or acute concerns   #hypertension S: medication: Amlodipine 5 mg in AM Home readings #s:  Average over last 12 readings this month  133.25 80.75   BP Readings from Last 3 Encounters:  01/26/20 (!) 180/90  01/03/20 (!) 185/93  12/06/19 (!) 184/89  A/P: great control at home. White coat hypertension in office. Continue current meds- she is going to move amlodipine to bedtime for overall lower heart attack/stroke risk- plus tends to run higher in AM and this may help   #thyroid nodule- normal T4 and TSH. She is going to look into potential referral to Endocrine- I gave her 3 names- Dr. SKelton Pillar Dr. GCruzita Ledererand Dr. KBuddy Dutyand she is going to read about this and decide  # low back pain and crouches over- was told related to scoliosis. She is really active at home and wants to remain active.  - She is  considering ortho or sports med referral- I recommended Dr. STamala Julianor Dr. CGeorgina Snell- primarily  wants help with straightening up  # right hand tingling- reaching up and felt numbness into fingers 4 and 5 in the right hand. Had similar issues if turns her head to the right. Sounds like ulnar neuropathy- gave handout- she will consider seeing sports medicine- avoid any triggers- try not to put pressure on elbows.   # some loss of smell for a month- no loss of taste. Had already been vaccinated- will continue to monitor.   # Multiple myeloma not having achieved remission ([place])- she luckily is on maintenance meds. Had been in wheelchair last visit- has made huge strides! Ongoing back pain related to this.   #hyperlipidemia S: Medication:none  Lab Results  Component Value Date   CHOL 182 04/04/2018   HDL 59.60 04/04/2018   LDLCALC 100 (H) 04/04/2018   TRIG 115.0 04/04/2018   CHOLHDL 3 04/04/2018   A/P: update lipids- mild elevations in the past- with multiple myeloma I am less likely to start her on statin to be honest- we will see where #s stand and as long as LDL close to 100 likely remain off  # Hyperglycemia/insulin resistance/prediabetes S:  Medication: none, had considered metformin in past Lab Results  Component Value Date   HGBA1C 5.6 10/10/2018   HGBA1C 6.3 04/04/2018   HGBA1C 5.9 09/17/2017   A/P: last a1c was normal- will update again today      Recommended follow up: Return in about 6 months (around 07/27/2020) for follow up- or sooner if needed. Future Appointments  Date Time Provider [place]  02/21/2020 10:30 AM [facility] LAB 1 [facility] None  02/28/2020  9:00 AM [name], MD [facility] None  02/28/2020 10:15 AM [facility] INFUSION [facility] None   Lab/Order associations: not fasting   ICD-10-CM   1. Preventative health care  Z00.00   2. Essential hypertension  I10 Lipid panel    Lipid panel    CANCELED: Lipid panel  3. Multiple myeloma not  having achieved remission ([facility])  C90.00   4. Hyperlipidemia, unspecified hyperlipidemia type  E78.5   5. Hyperglycemia  R73.9 Hemoglobin A1c    Hemoglobin A1c    CANCELED: Hemoglobin A1c  6. Age-related osteoporosis without current pathological fracture  M81.0 VITAMIN D 25 Hydroxy (Vit-D Deficiency, Fractures)    VITAMIN D 25 Hydroxy (Vit-D Deficiency, Fractures)    CANCELED: VITAMIN D 25 Hydroxy (Vit-D Deficiency, Fractures)  7. Paresthesia  R20.2 Vitamin B12    Vitamin B12    CANCELED: Vitamin B12    Meds ordered this encounter  Medications  . amLODipine (NORVASC) 5 MG tablet    Sig: Take 1 tablet (5 mg total) by mouth daily.    Dispense:  90 tablet    Refill:  3    Return precautions advised.  Garret Reddish, MD

## 2020-01-27 LAB — LIPID PANEL
Cholesterol: 220 mg/dL — ABNORMAL HIGH (ref <200)
HDL: 68 mg/dL (ref >=50)
LDL Cholesterol (Calc): 132 mg/dL (calc) — ABNORMAL HIGH
Non-HDL Cholesterol (Calc): 152 mg/dL (calc) — ABNORMAL HIGH (ref <130)
Total CHOL/HDL Ratio: 3.2 (calc) (ref <5.0)
Triglycerides: 97 mg/dL (ref <150)

## 2020-01-27 LAB — VITAMIN D 25 HYDROXY (VIT D DEFICIENCY, FRACTURES): Vit D, 25-Hydroxy: 47 ng/mL (ref 30–100)

## 2020-01-27 LAB — HEMOGLOBIN A1C
Hgb A1c MFr Bld: 5.8 % of total Hgb — ABNORMAL HIGH (ref <5.7)
Mean Plasma Glucose: 120 (calc)
eAG (mmol/L): 6.6 (calc)

## 2020-01-27 LAB — SPECIMEN COMPROMISED

## 2020-01-27 LAB — VITAMIN B12: Vitamin B-12: 426 pg/mL (ref 200–1100)

## 2020-01-30 ENCOUNTER — Other Ambulatory Visit: Payer: Self-pay | Admitting: *Deleted

## 2020-01-30 DIAGNOSIS — C9000 Multiple myeloma not having achieved remission: Secondary | ICD-10-CM

## 2020-01-30 MED ORDER — OXYCODONE-ACETAMINOPHEN 5-325 MG PO TABS
1.0000 | ORAL_TABLET | ORAL | 0 refills | Status: DC | PRN
Start: 2020-01-30 — End: 2020-02-28

## 2020-01-30 NOTE — Telephone Encounter (Signed)
Patient requested refill of oxycodone 

## 2020-02-06 DIAGNOSIS — R69 Illness, unspecified: Secondary | ICD-10-CM | POA: Diagnosis not present

## 2020-02-09 ENCOUNTER — Other Ambulatory Visit: Payer: Self-pay | Admitting: *Deleted

## 2020-02-09 DIAGNOSIS — C9000 Multiple myeloma not having achieved remission: Secondary | ICD-10-CM

## 2020-02-09 MED ORDER — FENTANYL 25 MCG/HR TD PT72: 1.0000 | MEDICATED_PATCH | TRANSDERMAL | 0 refills | Status: DC

## 2020-02-09 NOTE — Telephone Encounter (Signed)
Requested refill of Fentanyl patch 

## 2020-02-21 ENCOUNTER — Other Ambulatory Visit: Payer: Self-pay

## 2020-02-21 ENCOUNTER — Inpatient Hospital Stay: Payer: Medicare HMO | Attending: Hematology

## 2020-02-21 DIAGNOSIS — I700 Atherosclerosis of aorta: Secondary | ICD-10-CM | POA: Diagnosis not present

## 2020-02-21 DIAGNOSIS — K5900 Constipation, unspecified: Secondary | ICD-10-CM | POA: Diagnosis not present

## 2020-02-21 DIAGNOSIS — C9000 Multiple myeloma not having achieved remission: Secondary | ICD-10-CM | POA: Diagnosis present

## 2020-02-21 DIAGNOSIS — I10 Essential (primary) hypertension: Secondary | ICD-10-CM | POA: Diagnosis not present

## 2020-02-21 DIAGNOSIS — Z79899 Other long term (current) drug therapy: Secondary | ICD-10-CM | POA: Insufficient documentation

## 2020-02-21 DIAGNOSIS — Z87891 Personal history of nicotine dependence: Secondary | ICD-10-CM | POA: Diagnosis not present

## 2020-02-21 DIAGNOSIS — M8580 Other specified disorders of bone density and structure, unspecified site: Secondary | ICD-10-CM | POA: Diagnosis not present

## 2020-02-21 DIAGNOSIS — D61818 Other pancytopenia: Secondary | ICD-10-CM | POA: Insufficient documentation

## 2020-02-21 DIAGNOSIS — Z803 Family history of malignant neoplasm of breast: Secondary | ICD-10-CM | POA: Diagnosis not present

## 2020-02-21 LAB — CBC WITH DIFFERENTIAL/PLATELET
Abs Immature Granulocytes: 0.01 10*3/uL (ref 0.00–0.07)
Basophils Absolute: 0 10*3/uL (ref 0.0–0.1)
Basophils Relative: 1 %
Eosinophils Absolute: 0 10*3/uL (ref 0.0–0.5)
Eosinophils Relative: 1 %
HCT: 37.1 % (ref 36.0–46.0)
Hemoglobin: 12.2 g/dL (ref 12.0–15.0)
Immature Granulocytes: 0 %
Lymphocytes Relative: 9 %
Lymphs Abs: 0.4 10*3/uL — ABNORMAL LOW (ref 0.7–4.0)
MCH: 33.1 pg (ref 26.0–34.0)
MCHC: 32.9 g/dL (ref 30.0–36.0)
MCV: 100.5 fL — ABNORMAL HIGH (ref 80.0–100.0)
Monocytes Absolute: 0.4 10*3/uL (ref 0.1–1.0)
Monocytes Relative: 9 %
Neutro Abs: 3.5 10*3/uL (ref 1.7–7.7)
Neutrophils Relative %: 80 %
Platelets: 223 10*3/uL (ref 150–400)
RBC: 3.69 MIL/uL — ABNORMAL LOW (ref 3.87–5.11)
RDW: 13.7 % (ref 11.5–15.5)
WBC: 4.3 10*3/uL (ref 4.0–10.5)
nRBC: 0 % (ref 0.0–0.2)

## 2020-02-21 LAB — CMP (CANCER CENTER ONLY)
ALT: 17 U/L (ref 0–44)
AST: 20 U/L (ref 15–41)
Albumin: 4.3 g/dL (ref 3.5–5.0)
Alkaline Phosphatase: 35 U/L — ABNORMAL LOW (ref 38–126)
Anion gap: 8 (ref 5–15)
BUN: 14 mg/dL (ref 8–23)
CO2: 28 mmol/L (ref 22–32)
Calcium: 9.5 mg/dL (ref 8.9–10.3)
Chloride: 104 mmol/L (ref 98–111)
Creatinine: 0.74 mg/dL (ref 0.44–1.00)
GFR, Est AFR Am: >60 mL/min (ref >60)
GFR, Estimated: >60 mL/min (ref >60)
Glucose, Bld: 104 mg/dL — ABNORMAL HIGH (ref 70–99)
Potassium: 4.2 mmol/L (ref 3.5–5.1)
Sodium: 140 mmol/L (ref 135–145)
Total Bilirubin: 0.4 mg/dL (ref 0.3–1.2)
Total Protein: 6.9 g/dL (ref 6.5–8.1)

## 2020-02-21 MED FILL — NINLARO 3 MG CAPS: 3 | 28 days supply | Qty: 3 | Fill #2

## 2020-02-27 NOTE — Progress Notes (Signed)
HEMATOLOGY/ONCOLOGY CLINIC NOTE  Date of Service: 02/28/2020  Patient Care Team: [name], MD as PCP - General (Family Medicine) [name], MD as Consulting Physician (Hematology) Bond, [name], MD as Referring Physician (Ophthalmology) [name], [name], MD as Consulting Physician (Hematology and Oncology)  Dr. Ronney Lion as [place] Specialist  [phone]  CHIEF COMPLAINTS/PURPOSE OF CONSULTATION:   Multiple Myeloma- continued management  HISTORY OF PRESENTING ILLNESS:   Megan Olson is a wonderful 79 y.o. female who has been referred to [place] by Dr. 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 She is accompanied today by her husband. The pt reports that she is doing well overall.   The pt notes that she was doing extensive yard work in October 2019, and began to feel back pain a few days after this, which she attributed to muscle pain. She recalls taking a deep breath, and developing sudden acute pain, and presented to care with her PCP's office. She began exercises for her back pain, without relief, then began PT without relief, then was referred to Dr. Paulla Fore in sports medicine in late January 2020. She had an XR which revealed a compression fracture at T11, then subsequently had an MRI, and a bone marrow biopsy. The pt notes that her back pain "seemed to move around." She endorses pain "up the whole left side" of her back.  The pt notes that she is not able to stand up straight due to her back pain, which she feels limits her ability to take a deep breath, and endorses pain exacerbation when she does take a deep breath. The pt needs assistance from sitting to lying from her husband. She is using about 3 Percocet a day.  The pt notes worsened constipation since beginning Percocet, and notes that her most recent laxative order was not covered by her insurance. She is using prune juice and milk  of magnesia. She took Senokot S BID without relief.  The pt reports that prior to her recent back pain, she had very few medical concerns. She endorses controlled BP, and has been monitored for DM but after closely watching her diet her A1C decreased to 5.8. She has glaucoma, and has had surgery in both eyes. Right eye with stent. The pt sees Dr. Edilia Bo at [facility] for her eye care. The pt notes that her vision has been recently "pretty good." The pt notes that she has been advised to limit treatment with steroids for her glaucoma. She denies heart or lung problems. Denies previous back problems. Last DEXA scan 3 years ago, and endorses osteopenia. She notes that she took Fosamax for 3-4 years, and stopped 5-6 years ago. She takes Vitamin D, a multivitamin, and magnesium.  The pt notes that she quit smoking cigarettes when she was 27, started when she was about 20. The pt consumes alcohol rarely, but not since beginning narcotics. She previously worked in [employer].  Of note prior to the patient's visit today, pt has had an MRI Thoracic Spine completed on 11/27/18 with results revealing Multifocal marrow signal abnormality consistent with metastatic disease or multiple myeloma. The patient has multiple compression fractures most consistent with pathologic injuries. Extensive marrow signal abnormality makes determining age of the fractures difficult but edema is most intense in T3. 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  Most recent  lab results (12/01/18) of CBC w/diff is as follows: all values are WNL except for RBC at 3.17, HGB at 10.4, HCT at 33.5, MCV at 105.7. 11/28/18 CMP revealed all values WNL except for Glucose at 105, Total Protein at 10.2, Albumin at 3.1 11/30/18 24HR UPEP revealed all values WNL except for M-spike at 75.1%. 11/28/18 Bega-2 microglobulin slightly elevated at 2.6 11/28/18 SPEP revealed  M spike at 4.6g 11/28/18 Immunoglobulins revealed IgG at 6181, IgA at 24, IgM at 16, and IgE at 6.  On review of systems, pt reports constipation, back pain, stable energy levels, ankle swelling, tenderness at T3, lower back pain, and denies abdominal pains, neck pain, and any other symptoms.   On PMHx the pt reports redundant colon, single hemorrhoid, glaucoma, HTN, osteopenia, Tonsillectomy, right eye stent and multiple eye surgeries. On Social Hx the pt reports rare alcohol use, smoked cigarettes between ages 56 and 89. 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 On Family Hx the pt reports sister died from small cell lung cancer and was a lifelong smoker, brother's daughter died of breast cancer with BRCA1 and BRCA2 mutations. Cousin with female breast cancer.   INTERVAL HISTORY:   Megan Olson returns today for management and evaluation of her multiple myeloma. The patient's last visit with [place] was on 01/03/2020. The pt reports that she is doing well overall.  The pt reports that she has been well and has no new concerns. She is currently using 2.5 Oxycodone tablets per day and her pain is well-controlled. Her pain is worse toward the end of the day. It is primarily located in the back and is more on the right side. Pt has been more active lately. She continues 3000 UT Vitamin D daily.   Lab results (02/21/20) of CBC w/diff and CMP is as follows: all values are WNL except for RBC at 3.69, MCV at 100.5, Lymphs Abs at 0.4K, Glucose at 104, ALP at 35. 02/21/2020 MMP  Show stable M spike of 0.2g/dl  On review of systems, pt reports chronic back pain and denies new bone pain, leg swelling, abdominal pain any other symptoms.   MEDICAL HISTORY:  Past Medical History:  Diagnosis Date  . Cancer ([place])    multiple myeloma  . Glaucoma   . HYPERTENSION 03/11/2007  . OSTEOPENIA 03/11/2007    SURGICAL HISTORY: Past Surgical History:  Procedure Laterality Date  . BREAST EXCISIONAL BIOPSY Right 2000  . BREAST  LUMPECTOMY  1990   benign  . DILATION AND CURETTAGE OF UTERUS     bleeding at menopause. No uterine cancer  . IR RADIOLOGIST EVAL & MGMT  12/13/2018  . TONSILLECTOMY     age 89    SOCIAL HISTORY: Social History   Socioeconomic History  . Marital status: Married    Spouse name: Not on file  . Number of children: Not on file  . Years of education: Not on file  . Highest education level: Not on file  Occupational History  . Not on file  Tobacco Use  . Smoking status: Former Smoker    Packs/day: 0.50    Years: 7.00    Pack years: 3.50    Types: Cigarettes    Quit date: 01/04/1961    Years since quitting: 59.1  . Smokeless tobacco: Never Used  Substance and Sexual Activity  . Alcohol use: Yes    Alcohol/week: 1.0 standard drinks    Types: 1 Standard drinks or equivalent per week  . Drug use: No  .  Sexual activity: Not Currently  Other Topics Concern  . Not on file  Social History Narrative   Married. Lives with husband (patient of Dr. Yong Channel). 1 son. No grandkids. 1 granddog.       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 Difficulty of Paying Living Expenses:   Food Insecurity:   . Worried About Charity fundraiser in the Last Year:   . Arboriculturist in the Last Year:   Transportation Needs:   . Film/video editor (Medical):   Marland Kitchen Lack of Transportation (Non-Medical):   Physical Activity:   . Days of Exercise per Week:   . Minutes of Exercise per Session:   Stress:   . Feeling of Stress :   Social Connections:   . Frequency of Communication with Friends and Family:   . Frequency of Social Gatherings with Friends and Family:   . Attends Religious Services:   . Active Member of Clubs or Organizations:   . Attends Archivist Meetings:   Marland Kitchen Marital Status:   Intimate  Partner Violence:   . Fear of Current or Ex-Partner:   . Emotionally Abused:   Marland Kitchen Physically Abused:   . Sexually Abused:     FAMILY HISTORY: Family History  Problem Relation Age of Onset  . Heart disease Mother        CHF mother died 60  . Arthritis Mother   . Glaucoma Mother        sister as well  . Alcohol abuse Father   . Suicidality Father   . Cancer Sister        lung cancer, smoker  . Heart disease Sister        aortic valve replacement  . Hyperlipidemia Brother   . Hypertension Brother   . COPD Brother   . Arthritis Sister   . Hypertension Sister   . Glaucoma Sister   . Hashimoto's thyroiditis Sister   . Hypertension Son   . Stroke Maternal Grandmother   . Colon cancer Neg Hx   . Colon polyps Neg Hx     ALLERGIES:  is allergic to ace inhibitors; benadryl [diphenhydramine]; diamox [acetazolamide]; sulfamethoxazole; lenalidomide; and penicillins.   MEDICATIONS:  Current Outpatient Medications  Medication Sig Dispense Refill  . acyclovir (ZOVIRAX) 200 MG capsule Take 1 capsule (200 mg total) by mouth 2 (two) times daily. 60 capsule 11  . ALPHAGAN P 0.1 % SOLN Apply 1 drop topically See admin instructions. Apply 2 drops in right eye 3 times a day    . amLODipine (NORVASC) 5 MG tablet Take 1 tablet (5 mg total) by mouth daily. 90 tablet 3  . aspirin EC 81 MG tablet Take 81 mg by mouth daily.    . bimatoprost (LUMIGAN) 0.03 % ophthalmic solution Place 1 drop into the right eye at bedtime.     . Cholecalciferol (VITAMIN D3) 1000 UNITS CAPS Take 3,000 Units by mouth daily.     Marland Kitchen co-enzyme Q-10 50 MG capsule Take 50 mg by mouth daily.      . dorzolamide-timolol (COSOPT) 22.3-6.8 MG/ML ophthalmic solution Place 2 drops into both eyes 2 (two) times daily.   11  . famotidine (PEPCID) 10 MG tablet Take 10 mg by mouth 2 (two) times daily.    Marland Kitchen  fentaNYL (DURAGESIC) 25 MCG/HR Place 1 patch onto the skin every 3 (three) days. 10 patch 0  . Magnesium Oxide 500 MG TABS Take 1  tablet by mouth daily.      . Multiple Vitamins-Minerals (MULTIVITAMIN WITH MINERALS) tablet Take 1 tablet by mouth daily.      Marland Kitchen NINLARO 3 MG capsule TAKE 1 CAP (3 MG) BY MOUTH WEEKLY, 3 WEEKS ON, 1 WEEK OFF, REPEAT EVERY 4 WEEKS. TAKE ON AN EMPTY STOMACH 1HR BEFORE OR 2HR AFTER MEAL 3 capsule 2  . oxyCODONE-acetaminophen (PERCOCET) 5-325 MG tablet Take 1-2 tablets by mouth every 4 (four) hours as needed for moderate pain or severe pain. 90 tablet 0  . polyethylene glycol (MIRALAX) packet Take 17 g by mouth daily. 30 each 1  . senna-docusate (SENOKOT-S) 8.6-50 MG tablet Take 2 tablets by mouth at bedtime. 60 tablet 2  . vitamin C (ASCORBIC ACID) 500 MG tablet Take 500 mg by mouth daily.    . hydrOXYzine (ATARAX/VISTARIL) 25 MG tablet Take 1 tablet (25 mg total) by mouth 3 (three) times daily as needed. (Patient not taking: Reported on 01/26/2020) 30 tablet 0  . magnesium hydroxide (MILK OF MAGNESIA) 400 MG/5ML suspension Take 15 mLs by mouth daily as needed for mild constipation or moderate constipation. (Patient not taking: Reported on 01/26/2020) 355 mL 0   No current facility-administered medications for this visit.    REVIEW OF SYSTEMS:   A 10+ POINT REVIEW OF SYSTEMS WAS OBTAINED including neurology, dermatology, psychiatry, cardiac, respiratory, lymph, extremities, GI, GU, Musculoskeletal, constitutional, breasts, reproductive, HEENT.  All pertinent positives are noted in the HPI.  All others are negative.   PHYSICAL EXAMINATION: ECOG PERFORMANCE STATUS: 1-2  Vitals:   02/28/20 0901  BP: (!) 204/94  Pulse: 78  Resp: 20  Temp: 98.1 F (36.7 C)  SpO2: 99%   Filed Weights   02/28/20 0901  Weight: 107 lb 12.8 oz (48.9 kg)   Body mass index is 19.4 kg/m.   Exam was given in chair   GENERAL:alert, in no acute distress and comfortable SKIN: no acute rashes, no significant lesions EYES: conjunctiva are pink and non-injected, sclera anicteric OROPHARYNX: MMM, no exudates, no  oropharyngeal erythema or ulceration NECK: supple, no JVD LYMPH:  no palpable lymphadenopathy in the cervical, axillary or inguinal regions LUNGS: clear to auscultation b/l with normal respiratory effort HEART: regular rate & rhythm ABDOMEN:  normoactive bowel sounds , non tender, not distended. No palpable hepatosplenomegaly.  Extremity: no pedal edema PSYCH: alert & oriented x 3 with fluent speech NEURO: no focal motor/sensory deficits  LABORATORY DATA:  I have reviewed the data as listed  . CBC Latest Ref Rng & Units 02/21/2020 12/27/2019 12/06/2019  WBC 4.0 - 10.5 K/uL 4.3 4.3 2.4(L)  Hemoglobin 12.0 - 15.0 g/dL 12.2 12.0 11.4(L)  Hematocrit 36.0 - 46.0 % 37.1 37.7 35.8(L)  Platelets 150 - 400 K/uL 223 208 182    . CMP Latest Ref Rng & Units 02/21/2020 12/27/2019 12/06/2019  Glucose 70 - 99 mg/dL 104(H) 103(H) 152(H)  BUN 8 - 23 mg/dL '14 14 15  '$ Creatinine 0.44 - 1.00 mg/dL 0.74 0.72 0.85  Sodium 135 - 145 mmol/L 140 143 144  Potassium 3.5 - 5.1 mmol/L 4.2 4.3 4.0  Chloride 98 - 111 mmol/L 104 105 104  CO2 22 - 32 mmol/L '28 30 31  '$ Calcium 8.9 - 10.3 mg/dL 9.5 9.7 9.4  Total Protein 6.5 - 8.1 g/dL 6.9 6.9 6.7  Total Bilirubin  0.3 - 1.2 mg/dL 0.4 0.3 <0.2(L)  Alkaline Phos 38 - 126 U/L 35(L) 36(L) 38  AST 15 - 41 U/L '20 18 19  '$ ALT 0 - 44 U/L '17 17 15   '$ 09/11/2019 Flow Pathology Report [id number]):   09/11/2019 Bone Marrow Report [id number]):    07/14/2019 Cytogenetics [id number]):    07/14/2019 Flow Pathology Report [id number]):   07/14/2019 BM Bx ([id number]):    12/01/18 BM Bx:   12/01/18 Flow Cytometry:       RADIOGRAPHIC STUDIES: I have personally reviewed the radiological images as listed and agreed with the findings in the report. No results found.   ASSESSMENT & PLAN:  80 y.o. female with  1. Multiple Myeloma with IgG Lambda specificity Labs upon initial presentation from 12/01/18 CBC w/diff revealed HGB at 10.4 with MCV of 105.7.  11/28/18 CMP revealed Creatinine normal at 0.68 and Calcium normal at 8.9. 11/28/18 Beta 2 microglobulin at 2.'6mg'$  (also a reading at 4.'7mg'$  on the same day). 11/30/18 24HR UPEP revealed M spike at 75.1%. 11/28/18 SPEP revealed M spike of 4.6g. 11/28/18 Immunoglobulins revealed IgG elevated at '6181mg'$ .  12/01/18 BM Bx revealed hypercellular bone marrow with 70-80% CD138 immunohistochemistry (44% aspirate) lambda-restricted plasma cells as well as a kappa restricted population of B-cells 11/27/18 MRI Thoracic Spine which revealed Multifocal marrow signal abnormality consistent with metastatic disease or multiple myeloma. The patient has multiple compression fractures most consistent with pathologic injuries. Extensive marrow signal abnormality makes determining age of the fractures difficult but edema is most intense in T3. 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 2. Areas of hypermetabolic disease identified in both lungs suggesting multiple myeloma involvement. 3. 1.9 cm calcified left thyroid nodule is hypermetabolic, but Indeterminate. 4. Colon is diffusely distended with gas and stool. Imaging features would be compatible with clinical constipation. 5.  Aortic Atherosclerois.  Pt describes grade II to III rash on her re-challenge from Revlimid with optimized pre-medications, and we discontinued Revlimid  Began Cytoxan with C4 Velcade and Dexamethasone  05/16/2019 M spike is down to 0.2g/dl showing continued improvement and a 90% reduction  -07/14/2019 BM Bx revealed BONE MARROW: - Hypercellular marrow with residual plasma cell neoplasm (<10%) PERIPHERAL  BLOOD: - Pancytopenia".   07/17/2019 PET/CT Whole Body Scan ([id number]) revealed "1. 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 2. Hypermetabolic calcified left thyroid nodule, as before, indeterminate. 3. Aortic atherosclerosis (ICD10-170.0). Coronary artery calcification."  09/11/2019 Bone Marrow Report ([id number]) revealed "BONE MARROW, ASPIRATE, CLOT, CORE:  -Variably cellular bone marrow with trilineage hematopoiesis and 2% plasma cells PERIPHERAL BLOOD:  -Pancytopenia".  09/11/2019 Flow Pathology Report [id number]) revealed "-Predominance of T lymphocytes with relative abundance of CD8 positive cells  -No significant B-cell population identified -Relative abundance of natural killer cells".  09/14/2019 PET/CT ([id number]) revealed "1. No findings of active malignancy. 2. Numerous thoracolumbar compression fractures many of which may be associated with osteoporosis. 3. Calcified 1.5 cm in diameter left thyroid nodule with persistent elevated activity. A significant minority of hypermetabolic thyroid nodules can be malignant, if not previously worked up to then thyroid ultrasound would be recommended as a next step. 4. There a few  small lucent and small sclerotic lesions in the skeleton which are not hypermetabolic, possibilities include benign lesions or previously effectively treated myeloma. 5. Other imaging findings of potential clinical significance: Aortic Atherosclerosis (ICD10-I70.0). Coronary atherosclerosis. Prominent stool throughout the colon favors constipation. Hyperdense right renal lesion is probably a complex cyst but technically nonspecific."  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 She does not currently have elevated lymphocyte counts on  peripheral blood draws.   PLAN: -Discussed pt labwork, 02/21/20; blood counts and chemistries are stable -Discussed 02/21/2020 MMP - M spike stable @ 0.2g/dl -No lab or clinical progression of Multiple Myeloma or Monoclonal B-cell Lymphocytosis at this time -The pt has no prohibitive toxicities from continuing Ninlaro (3 weeks on & 1 week off) at this time -Recommend pt avoid straining her low-back -Discussed pt speaking with an Orthopedist for proper exercise/PT recommendation in the context of her scoliosis. -Recommend pt receive prophylactic antibiotics prior to any dental procedures  -Recommend pt receive referral to Endocrinologist from PCP -Recommend pt use delayed-release Magnesium supplements or eat a Magnesium rich diet to improve muscle cramps. Advised pt that Magnesium Citrate can cause diarrhea.  -Recommend pt take 5000 UT Vitamin D daily. Goal Vitamin D is 60 <>90  -Continue Zometa q8weeks  -Refill Acyclovir, Oxycodone -Will see back in 2 months, with labs 1 week prior   FOLLOW UP: RTC with Dr Irene Limbo with labs and Zometa infusion in 8 weeks Please schedule the lab appointment 1 week prior to clinic visit   The total time spent in the appt was 30 minutes and more than 50% was on counseling and direct patient cares.  All of the patient's questions were answered with apparent satisfaction. The patient knows to call the clinic with any problems, questions or concerns.    Sullivan Lone MD [place] [facility] Hematology/Oncology Physician [facility]  (Office):       [phone] (Work cell):  [phone] (Fax):           [phone]  02/28/2020 9:46 AM  I, Yevette Edwards, am acting as a scribe for Dr. Sullivan Lone.   .I have reviewed the above documentation for accuracy and completeness, and I agree with the above. [name] MD

## 2020-02-28 ENCOUNTER — Other Ambulatory Visit: Payer: Medicare HMO

## 2020-02-28 ENCOUNTER — Other Ambulatory Visit: Payer: Self-pay

## 2020-02-28 ENCOUNTER — Inpatient Hospital Stay: Payer: Medicare HMO

## 2020-02-28 ENCOUNTER — Inpatient Hospital Stay: Payer: Medicare HMO | Admitting: Hematology

## 2020-02-28 VITALS — BP 204/94 | HR 78 | Temp 98.1°F | Resp 20 | Ht 62.5 in | Wt 107.8 lb

## 2020-02-28 DIAGNOSIS — D61818 Other pancytopenia: Secondary | ICD-10-CM | POA: Diagnosis not present

## 2020-02-28 DIAGNOSIS — G893 Neoplasm related pain (acute) (chronic): Secondary | ICD-10-CM | POA: Diagnosis not present

## 2020-02-28 DIAGNOSIS — C9000 Multiple myeloma not having achieved remission: Secondary | ICD-10-CM | POA: Diagnosis not present

## 2020-02-28 DIAGNOSIS — Z5111 Encounter for antineoplastic chemotherapy: Secondary | ICD-10-CM | POA: Diagnosis not present

## 2020-02-28 DIAGNOSIS — Z7189 Other specified counseling: Secondary | ICD-10-CM

## 2020-02-28 DIAGNOSIS — I700 Atherosclerosis of aorta: Secondary | ICD-10-CM | POA: Diagnosis not present

## 2020-02-28 DIAGNOSIS — Z79899 Other long term (current) drug therapy: Secondary | ICD-10-CM | POA: Diagnosis not present

## 2020-02-28 DIAGNOSIS — Z87891 Personal history of nicotine dependence: Secondary | ICD-10-CM | POA: Diagnosis not present

## 2020-02-28 DIAGNOSIS — I10 Essential (primary) hypertension: Secondary | ICD-10-CM | POA: Diagnosis not present

## 2020-02-28 DIAGNOSIS — Z803 Family history of malignant neoplasm of breast: Secondary | ICD-10-CM | POA: Diagnosis not present

## 2020-02-28 DIAGNOSIS — K5900 Constipation, unspecified: Secondary | ICD-10-CM | POA: Diagnosis not present

## 2020-02-28 DIAGNOSIS — M8580 Other specified disorders of bone density and structure, unspecified site: Secondary | ICD-10-CM | POA: Diagnosis not present

## 2020-02-28 LAB — MULTIPLE MYELOMA PANEL, SERUM
Albumin SerPl Elph-Mcnc: 4.1 g/dL (ref 2.9–4.4)
Albumin/Glob SerPl: 1.8 — ABNORMAL HIGH (ref 0.7–1.7)
Alpha 1: 0.2 g/dL (ref 0.0–0.4)
Alpha2 Glob SerPl Elph-Mcnc: 0.7 g/dL (ref 0.4–1.0)
B-Globulin SerPl Elph-Mcnc: 1.1 g/dL (ref 0.7–1.3)
Gamma Glob SerPl Elph-Mcnc: 0.4 g/dL (ref 0.4–1.8)
Globulin, Total: 2.4 g/dL (ref 2.2–3.9)
IgA: 22 mg/dL — ABNORMAL LOW (ref 64–422)
IgG (Immunoglobin G), Serum: 551 mg/dL — ABNORMAL LOW (ref 586–1602)
IgM (Immunoglobulin M), Srm: 19 mg/dL — ABNORMAL LOW (ref 26–217)
M Protein SerPl Elph-Mcnc: 0.2 g/dL — ABNORMAL HIGH
Total Protein ELP: 6.5 g/dL (ref 6.0–8.5)

## 2020-02-28 MED ORDER — OXYCODONE-ACETAMINOPHEN 5-325 MG PO TABS
1.0000 | ORAL_TABLET | ORAL | 0 refills | Status: DC | PRN
Start: 2020-02-28 — End: 2020-04-15

## 2020-02-28 MED ORDER — ZOLEDRONIC ACID 4 MG/100ML IV SOLN
INTRAVENOUS | Status: AC
  Filled 2020-02-28: qty 100

## 2020-02-28 MED ORDER — ACYCLOVIR 200 MG PO CAPS: 200.0000 mg | ORAL_CAPSULE | Freq: Two times a day (BID) | ORAL | 11 refills | Status: DC

## 2020-02-28 MED ORDER — SODIUM CHLORIDE 0.9 % IV SOLN
Freq: Once | INTRAVENOUS | Status: AC
  Filled 2020-02-28: qty 250

## 2020-02-28 MED ORDER — ZOLEDRONIC ACID 4 MG/100ML IV SOLN
4.0000 mg | Freq: Once | INTRAVENOUS | Status: AC
  Administered 2020-02-28: 4 mg via INTRAVENOUS

## 2020-02-28 NOTE — Patient Instructions (Signed)
Zoledronic Acid injection (Hypercalcemia, Oncology) What is this medicine? ZOLEDRONIC ACID (ZOE le dron ik AS id) lowers the amount of calcium loss from bone. It is used to treat too much calcium in your blood from cancer. It is also used to prevent complications of cancer that has spread to the bone. This medicine may be used for other purposes; ask your health care provider or pharmacist if you have questions. COMMON BRAND NAME(S): Zometa What should I tell my health care provider before I take this medicine? They need to know if you have any of these conditions:  aspirin-sensitive asthma  cancer, especially if you are receiving medicines used to treat cancer  dental disease or wear dentures  infection  kidney disease  receiving corticosteroids like dexamethasone or prednisone  an unusual or allergic reaction to zoledronic acid, other medicines, foods, dyes, or preservatives  pregnant or trying to get pregnant  breast-feeding How should I use this medicine? This medicine is for infusion into a vein. It is given by a health care professional in a hospital or clinic setting. Talk to your pediatrician regarding the use of this medicine in children. Special care may be needed. Overdosage: If you think you have taken too much of this medicine contact a poison control center or emergency room at once. NOTE: This medicine is only for you. Do not share this medicine with others. What if I miss a dose? It is important not to miss your dose. Call your doctor or health care professional if you are unable to keep an appointment. What may interact with this medicine?  certain antibiotics given by injection  NSAIDs, medicines for pain and inflammation, like ibuprofen or naproxen  some diuretics like bumetanide, furosemide  teriparatide  thalidomide This list may not describe all possible interactions. Give your health care provider a list of all the medicines, herbs, non-prescription  drugs, or dietary supplements you use. Also tell them if you smoke, drink alcohol, or use illegal drugs. Some items may interact with your medicine. What should I watch for while using this medicine? Visit your doctor or health care professional for regular checkups. It may be some time before you see the benefit from this medicine. Do not stop taking your medicine unless your doctor tells you to. Your doctor may order blood tests or other tests to see how you are doing. Women should inform their doctor if they wish to become pregnant or think they might be pregnant. There is a potential for serious side effects to an unborn child. Talk to your health care professional or pharmacist for more information. You should make sure that you get enough calcium and vitamin D while you are taking this medicine. Discuss the foods you eat and the vitamins you take with your health care professional. Some people who take this medicine have severe bone, joint, and/or muscle pain. This medicine may also increase your risk for jaw problems or a broken thigh bone. Tell your doctor right away if you have severe pain in your jaw, bones, joints, or muscles. Tell your doctor if you have any pain that does not go away or that gets worse. Tell your dentist and dental surgeon that you are taking this medicine. You should not have major dental surgery while on this medicine. See your dentist to have a dental exam and fix any dental problems before starting this medicine. Take good care of your teeth while on this medicine. Make sure you see your dentist for regular follow-up   appointments. What side effects may I notice from receiving this medicine? Side effects that you should report to your doctor or health care professional as soon as possible:  allergic reactions like skin rash, itching or hives, swelling of the face, lips, or tongue  anxiety, confusion, or depression  breathing problems  changes in vision  eye  pain  feeling faint or lightheaded, falls  jaw pain, especially after dental work  mouth sores  muscle cramps, stiffness, or weakness  redness, blistering, peeling or loosening of the skin, including inside the mouth  trouble passing urine or change in the amount of urine Side effects that usually do not require medical attention (report to your doctor or health care professional if they continue or are bothersome):  bone, joint, or muscle pain  constipation  diarrhea  fever  hair loss  irritation at site where injected  loss of appetite  nausea, vomiting  stomach upset  trouble sleeping  trouble swallowing  weak or tired This list may not describe all possible side effects. Call your doctor for medical advice about side effects. You may report side effects to FDA at 1-800-FDA-1088. Where should I keep my medicine? This drug is given in a hospital or clinic and will not be stored at home. NOTE: This sheet is a summary. It may not cover all possible information. If you have questions about this medicine, talk to your doctor, pharmacist, or health care provider.  2020 [employer] (2014-02-17 14:19:39)  

## 2020-03-01 ENCOUNTER — Telehealth: Payer: Self-pay | Admitting: Hematology

## 2020-03-01 ENCOUNTER — Telehealth: Payer: Self-pay

## 2020-03-01 NOTE — Telephone Encounter (Signed)
R/s appt per 5/28 sch message - unable to reach pt . Left message with appt date and time

## 2020-03-01 NOTE — Telephone Encounter (Signed)
Received call from patient regarding her next Lab appointment.  Scheduling message sent to change patient's Lab date from 04/24/20 to 04/17/20 (or 8 days prior) but not less than 1 week. Per Dr. Kelle Darting of 02/28/20, patient needs to get her Labs done 1 week prior to clinic visit.  Scheduling to call patient with new Lab date and time.

## 2020-03-08 ENCOUNTER — Telehealth: Payer: Self-pay

## 2020-03-08 ENCOUNTER — Other Ambulatory Visit: Payer: Self-pay | Admitting: Hematology

## 2020-03-08 MED ORDER — ACYCLOVIR 400 MG PO TABS
400.0000 mg | ORAL_TABLET | Freq: Two times a day (BID) | ORAL | 6 refills | Status: DC
Start: 2020-03-08 — End: 2020-07-03

## 2020-03-08 NOTE — Telephone Encounter (Signed)
Received call from patient regarding Acyclovir prescription for 400mg  BID, this has been sent to pharmacy and patient is aware.

## 2020-03-13 ENCOUNTER — Other Ambulatory Visit: Payer: Self-pay | Admitting: *Deleted

## 2020-03-13 DIAGNOSIS — C9000 Multiple myeloma not having achieved remission: Secondary | ICD-10-CM

## 2020-03-13 MED ORDER — FENTANYL 25 MCG/HR TD PT72: 1.0000 | MEDICATED_PATCH | TRANSDERMAL | 0 refills | Status: DC

## 2020-03-13 NOTE — Telephone Encounter (Signed)
Pt called - please refill Fentanyl before weekend

## 2020-03-18 ENCOUNTER — Other Ambulatory Visit: Payer: Self-pay | Admitting: Hematology

## 2020-03-19 MED FILL — NINLARO 3 MG CAPS: 3 | 28 days supply | Qty: 3 | Fill #0

## 2020-04-03 ENCOUNTER — Encounter: Payer: Self-pay | Admitting: Family Medicine

## 2020-04-03 NOTE — Telephone Encounter (Signed)
Ok to place referral to Dr. Arman Filter office? Please advise

## 2020-04-04 ENCOUNTER — Other Ambulatory Visit: Payer: Self-pay

## 2020-04-04 DIAGNOSIS — R739 Hyperglycemia, unspecified: Secondary | ICD-10-CM

## 2020-04-12 ENCOUNTER — Other Ambulatory Visit: Payer: Self-pay | Admitting: *Deleted

## 2020-04-12 DIAGNOSIS — C9000 Multiple myeloma not having achieved remission: Secondary | ICD-10-CM

## 2020-04-12 MED ORDER — FENTANYL 25 MCG/HR TD PT72: 1.0000 | MEDICATED_PATCH | TRANSDERMAL | 0 refills | Status: DC

## 2020-04-12 NOTE — Telephone Encounter (Signed)
Called -requested refill of Fentanyl patch

## 2020-04-15 ENCOUNTER — Telehealth: Payer: Self-pay | Admitting: *Deleted

## 2020-04-15 ENCOUNTER — Other Ambulatory Visit: Payer: Self-pay | Admitting: *Deleted

## 2020-04-15 DIAGNOSIS — C9000 Multiple myeloma not having achieved remission: Secondary | ICD-10-CM

## 2020-04-15 MED ORDER — OXYCODONE-ACETAMINOPHEN 5-325 MG PO TABS
1.0000 | ORAL_TABLET | ORAL | 0 refills | Status: DC | PRN
Start: 2020-04-15 — End: 2020-05-29

## 2020-04-15 NOTE — Telephone Encounter (Signed)
Patient called. Her pharmacy may only be able to fill part of the Fentanyl prescription tomorrow when it's due - they are expecting a shipment tomorrow. If they don't get the medication, patient will receive a partial fill. She will notify office if this happens so that it can be entered into her chart, as she will need a refill sooner than anticipated.

## 2020-04-15 NOTE — Telephone Encounter (Signed)
Patient called - requested refill Oxycodone. Request routed to Dr. Irene Limbo

## 2020-04-17 ENCOUNTER — Telehealth: Payer: Self-pay | Admitting: *Deleted

## 2020-04-17 ENCOUNTER — Other Ambulatory Visit: Payer: Self-pay

## 2020-04-17 ENCOUNTER — Inpatient Hospital Stay: Payer: Medicare HMO | Attending: Hematology

## 2020-04-17 DIAGNOSIS — Z87891 Personal history of nicotine dependence: Secondary | ICD-10-CM | POA: Diagnosis not present

## 2020-04-17 DIAGNOSIS — C9000 Multiple myeloma not having achieved remission: Secondary | ICD-10-CM | POA: Insufficient documentation

## 2020-04-17 DIAGNOSIS — Z79899 Other long term (current) drug therapy: Secondary | ICD-10-CM | POA: Diagnosis not present

## 2020-04-17 DIAGNOSIS — K5900 Constipation, unspecified: Secondary | ICD-10-CM | POA: Diagnosis not present

## 2020-04-17 DIAGNOSIS — I10 Essential (primary) hypertension: Secondary | ICD-10-CM | POA: Insufficient documentation

## 2020-04-17 DIAGNOSIS — M8580 Other specified disorders of bone density and structure, unspecified site: Secondary | ICD-10-CM | POA: Insufficient documentation

## 2020-04-17 DIAGNOSIS — D61818 Other pancytopenia: Secondary | ICD-10-CM | POA: Diagnosis not present

## 2020-04-17 DIAGNOSIS — M545 Low back pain: Secondary | ICD-10-CM | POA: Diagnosis not present

## 2020-04-17 LAB — CBC WITH DIFFERENTIAL/PLATELET
Abs Immature Granulocytes: 0.01 10*3/uL (ref 0.00–0.07)
Basophils Absolute: 0 10*3/uL (ref 0.0–0.1)
Basophils Relative: 1 %
Eosinophils Absolute: 0 10*3/uL (ref 0.0–0.5)
Eosinophils Relative: 1 %
HCT: 36.1 % (ref 36.0–46.0)
Hemoglobin: 11.8 g/dL — ABNORMAL LOW (ref 12.0–15.0)
Immature Granulocytes: 0 %
Lymphocytes Relative: 7 %
Lymphs Abs: 0.3 10*3/uL — ABNORMAL LOW (ref 0.7–4.0)
MCH: 33.5 pg (ref 26.0–34.0)
MCHC: 32.7 g/dL (ref 30.0–36.0)
MCV: 102.6 fL — ABNORMAL HIGH (ref 80.0–100.0)
Monocytes Absolute: 0.5 10*3/uL (ref 0.1–1.0)
Monocytes Relative: 12 %
Neutro Abs: 3.3 10*3/uL (ref 1.7–7.7)
Neutrophils Relative %: 79 %
Platelets: 231 10*3/uL (ref 150–400)
RBC: 3.52 MIL/uL — ABNORMAL LOW (ref 3.87–5.11)
RDW: 14.4 % (ref 11.5–15.5)
WBC: 4.2 10*3/uL (ref 4.0–10.5)
nRBC: 0 % (ref 0.0–0.2)

## 2020-04-17 LAB — CMP (CANCER CENTER ONLY)
ALT: 14 U/L (ref 0–44)
AST: 18 U/L (ref 15–41)
Albumin: 4.2 g/dL (ref 3.5–5.0)
Alkaline Phosphatase: 36 U/L — ABNORMAL LOW (ref 38–126)
Anion gap: 8 (ref 5–15)
BUN: 14 mg/dL (ref 8–23)
CO2: 28 mmol/L (ref 22–32)
Calcium: 9.6 mg/dL (ref 8.9–10.3)
Chloride: 104 mmol/L (ref 98–111)
Creatinine: 0.73 mg/dL (ref 0.44–1.00)
GFR, Est AFR Am: >60 mL/min (ref >60)
GFR, Estimated: >60 mL/min (ref >60)
Glucose, Bld: 105 mg/dL — ABNORMAL HIGH (ref 70–99)
Potassium: 4.1 mmol/L (ref 3.5–5.1)
Sodium: 140 mmol/L (ref 135–145)
Total Bilirubin: 0.5 mg/dL (ref 0.3–1.2)
Total Protein: 6.9 g/dL (ref 6.5–8.1)

## 2020-04-17 MED FILL — NINLARO 3 MG CAPS: 3 | 28 days supply | Qty: 3 | Fill #1

## 2020-04-17 NOTE — Telephone Encounter (Signed)
Patient called to notify provider that pharmacy only had box of 5 fentanyl patches so patient did not receive entire prescription of 10 patches as ordered on 7/9 and will require refill sooner.

## 2020-04-22 LAB — MULTIPLE MYELOMA PANEL, SERUM
Albumin SerPl Elph-Mcnc: 4 g/dL (ref 2.9–4.4)
Albumin/Glob SerPl: 1.8 — ABNORMAL HIGH (ref 0.7–1.7)
Alpha 1: 0.3 g/dL (ref 0.0–0.4)
Alpha2 Glob SerPl Elph-Mcnc: 0.7 g/dL (ref 0.4–1.0)
B-Globulin SerPl Elph-Mcnc: 1 g/dL (ref 0.7–1.3)
Gamma Glob SerPl Elph-Mcnc: 0.4 g/dL (ref 0.4–1.8)
Globulin, Total: 2.3 g/dL (ref 2.2–3.9)
IgA: 23 mg/dL — ABNORMAL LOW (ref 64–422)
IgG (Immunoglobin G), Serum: 552 mg/dL — ABNORMAL LOW (ref 586–1602)
IgM (Immunoglobulin M), Srm: 23 mg/dL — ABNORMAL LOW (ref 26–217)
Total Protein ELP: 6.3 g/dL (ref 6.0–8.5)

## 2020-04-23 ENCOUNTER — Other Ambulatory Visit: Payer: Self-pay

## 2020-04-23 DIAGNOSIS — E041 Nontoxic single thyroid nodule: Secondary | ICD-10-CM

## 2020-04-23 NOTE — Progress Notes (Signed)
HEMATOLOGY/ONCOLOGY CLINIC NOTE  Date of Service: 04/24/2020  Patient Care Team: [name], MD as PCP - General (Family Medicine) [name], MD as Consulting Physician (Hematology) Bond, [name], MD as Referring Physician (Ophthalmology) [name], [name], MD as Consulting Physician (Hematology and Oncology)  Dr. Ronney Lion as [place] Specialist  [phone]  CHIEF COMPLAINTS/PURPOSE OF CONSULTATION:   Multiple Myeloma- continued management  HISTORY OF PRESENTING ILLNESS:   Megan Olson is a wonderful 79 y.o. female who has been referred to [place] by Dr. 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 She is accompanied today by her husband. The pt reports that she is doing well overall.   The pt notes that she was doing extensive yard work in October 2019, and began to feel back pain a few days after this, which she attributed to muscle pain. She recalls taking a deep breath, and developing sudden acute pain, and presented to care with her PCP's office. She began exercises for her back pain, without relief, then began PT without relief, then was referred to Dr. Paulla Fore in sports medicine in late January 2020. She had an XR which revealed a compression fracture at T11, then subsequently had an MRI, and a bone marrow biopsy. The pt notes that her back pain "seemed to move around." She endorses pain "up the whole left side" of her back.  The pt notes that she is not able to stand up straight due to her back pain, which she feels limits her ability to take a deep breath, and endorses pain exacerbation when she does take a deep breath. The pt needs assistance from sitting to lying from her husband. She is using about 3 Percocet a day.  The pt notes worsened constipation since beginning Percocet, and notes that her most recent laxative order was not covered by her insurance. She is using prune juice and milk  of magnesia. She took Senokot S BID without relief.  The pt reports that prior to her recent back pain, she had very few medical concerns. She endorses controlled BP, and has been monitored for DM but after closely watching her diet her A1C decreased to 5.8. She has glaucoma, and has had surgery in both eyes. Right eye with stent. The pt sees Dr. Edilia Bo at [facility] for her eye care. The pt notes that her vision has been recently "pretty good." The pt notes that she has been advised to limit treatment with steroids for her glaucoma. She denies heart or lung problems. Denies previous back problems. Last DEXA scan 3 years ago, and endorses osteopenia. She notes that she took Fosamax for 3-4 years, and stopped 5-6 years ago. She takes Vitamin D, a multivitamin, and magnesium.  The pt notes that she quit smoking cigarettes when she was 27, started when she was about 20. The pt consumes alcohol rarely, but not since beginning narcotics. 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  Of note prior to the patient's visit today, pt has had an MRI Thoracic Spine completed on 11/27/18 with results revealing Multifocal marrow signal abnormality consistent with metastatic disease or multiple myeloma. The patient has multiple compression fractures most consistent with pathologic injuries. Extensive marrow signal abnormality makes determining age of the fractures difficult but edema is most intense in T3. 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  Most recent  lab results (12/01/18) of CBC w/diff is as follows: all values are WNL except for RBC at 3.17, HGB at 10.4, HCT at 33.5, MCV at 105.7. 11/28/18 CMP revealed all values WNL except for Glucose at 105, Total Protein at 10.2, Albumin at 3.1 11/30/18 24HR UPEP revealed all values WNL except for M-spike at 75.1%. 11/28/18 Bega-2 microglobulin slightly elevated at 2.6 11/28/18 SPEP revealed  M spike at 4.6g 11/28/18 Immunoglobulins revealed IgG at 6181, IgA at 24, IgM at 16, and IgE at 6.  On review of systems, pt reports constipation, back pain, stable energy levels, ankle swelling, tenderness at T3, lower back pain, and denies abdominal pains, neck pain, and any other symptoms.   On PMHx the pt reports redundant colon, single hemorrhoid, glaucoma, HTN, osteopenia, Tonsillectomy, right eye stent and multiple eye surgeries. On Social Hx the pt reports rare alcohol use, smoked cigarettes between ages 21 and 74. 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 On Family Hx the pt reports sister died from small cell lung cancer and was a lifelong smoker, brother's daughter died of breast cancer with BRCA1 and BRCA2 mutations. Cousin with female breast cancer.   INTERVAL HISTORY:   Megan Olson returns today for management and evaluation of her multiple myeloma. The patient's last visit with [place] was on 02/28/2020. The pt reports that she is doing well overall.  The pt reports that she has been feeling well and continuing to get activity. Pt has been having sharp back pain that comes and goes. Pt is experiencing some stress due to her husband's recent health concerns. She has received her first Shingrix injection.   Lab results (04/17/20) of CBC w/diff and CMP is as follows: all values are WNL except for RBC at 3.52, Hgb at 11.8, MCV at 102.6, Lymphs Abs at 0.3K, Glucose at 105, ALP at 36. 04/17/2020 MMP is as follows: all values are WNL except for IgG at 552, IgA at 23, IgM at 23, Albumin/Glob at 1.8.  On review of systems, pt reports stress, back pain and denies constipation, nausea, dyspepsia, tingling/numbness in hands/feet, abdominal pain and any other symptoms.   MEDICAL HISTORY:  Past Medical History:  Diagnosis Date  . Cancer ([place])    multiple myeloma  . Glaucoma   . HYPERTENSION 03/11/2007  . OSTEOPENIA 03/11/2007    SURGICAL HISTORY: Past Surgical History:  Procedure Laterality Date  .  BREAST EXCISIONAL BIOPSY Right 2000  . BREAST LUMPECTOMY  1990   benign  . DILATION AND CURETTAGE OF UTERUS     bleeding at menopause. No uterine cancer  . IR RADIOLOGIST EVAL & MGMT  12/13/2018  . TONSILLECTOMY     age 53    SOCIAL HISTORY: Social History   Socioeconomic History  . Marital status: Married    Spouse name: Not on file  . Number of children: Not on file  . Years of education: Not on file  . Highest education level: Not on file  Occupational History  . Not on file  Tobacco Use  . Smoking status: Former Smoker    Packs/day: 0.50    Years: 7.00    Pack years: 3.50    Types: Cigarettes    Quit date: 01/04/1961    Years since quitting: 59.3  . Smokeless tobacco: Never Used  Vaping Use  . Vaping Use: Never used  Substance and Sexual Activity  . Alcohol use: Yes    Alcohol/week: 1.0 standard drink    Types: 1 Standard drinks or equivalent per  week  . Drug use: No  . Sexual activity: Not Currently  Other Topics Concern  . Not on file  Social History Narrative   Married. Lives with husband (patient of Dr. Yong Channel). 1 son. No grandkids. 1 granddog.       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 Difficulty of Paying Living Expenses:   Food Insecurity:   . Worried About Charity fundraiser in the Last Year:   . Arboriculturist in the Last Year:   Transportation Needs:   . Film/video editor (Medical):   Marland Kitchen Lack of Transportation (Non-Medical):   Physical Activity:   . Days of Exercise per Week:   . Minutes of Exercise per Session:   Stress:   . Feeling of Stress :   Social Connections:   . Frequency of Communication with Friends and Family:   . Frequency of Social Gatherings with Friends and Family:   . Attends Religious Services:   . Active Member of Clubs or  Organizations:   . Attends Archivist Meetings:   Marland Kitchen Marital Status:   Intimate Partner Violence:   . Fear of Current or Ex-Partner:   . Emotionally Abused:   Marland Kitchen Physically Abused:   . Sexually Abused:     FAMILY HISTORY: Family History  Problem Relation Age of Onset  . Heart disease Mother        CHF mother died 45  . Arthritis Mother   . Glaucoma Mother        sister as well  . Alcohol abuse Father   . Suicidality Father   . Cancer Sister        lung cancer, smoker  . Heart disease Sister        aortic valve replacement  . Hyperlipidemia Brother   . Hypertension Brother   . COPD Brother   . Arthritis Sister   . Hypertension Sister   . Glaucoma Sister   . Hashimoto's thyroiditis Sister   . Hypertension Son   . Stroke Maternal Grandmother   . Colon cancer Neg Hx   . Colon polyps Neg Hx     ALLERGIES:  is allergic to ace inhibitors, benadryl [diphenhydramine], diamox [acetazolamide], sulfamethoxazole, lenalidomide, and penicillins.   MEDICATIONS:  Current Outpatient Medications  Medication Sig Dispense Refill  . acyclovir (ZOVIRAX) 400 MG tablet Take 1 tablet (400 mg total) by mouth 2 (two) times daily. 30 tablet 6  . ALPHAGAN P 0.1 % SOLN Apply 1 drop topically See admin instructions. Apply 2 drops in right eye 3 times a day    . amLODipine (NORVASC) 5 MG tablet Take 1 tablet (5 mg total) by mouth daily. 90 tablet 3  . aspirin EC 81 MG tablet Take 81 mg by mouth daily.    . bimatoprost (LUMIGAN) 0.03 % ophthalmic solution Place 1 drop into the right eye at bedtime.     . Cholecalciferol (VITAMIN D3) 1000 UNITS CAPS Take 3,000 Units by mouth daily.     Marland Kitchen co-enzyme Q-10 50 MG capsule Take 50 mg by mouth daily.      . dorzolamide-timolol (COSOPT) 22.3-6.8 MG/ML ophthalmic solution Place 2 drops into both eyes 2 (two) times daily.   11  . famotidine (PEPCID) 10 MG tablet Take 10 mg  by mouth 2 (two) times daily.    . fentaNYL (DURAGESIC) 25 MCG/HR Place 1 patch  onto the skin every 3 (three) days. 10 patch 0  . hydrOXYzine (ATARAX/VISTARIL) 25 MG tablet Take 1 tablet (25 mg total) by mouth 3 (three) times daily as needed. (Patient not taking: Reported on 01/26/2020) 30 tablet 0  . magnesium hydroxide (MILK OF MAGNESIA) 400 MG/5ML suspension Take 15 mLs by mouth daily as needed for mild constipation or moderate constipation. (Patient not taking: Reported on 01/26/2020) 355 mL 0  . Magnesium Oxide 500 MG TABS Take 1 tablet by mouth daily.      . Multiple Vitamins-Minerals (MULTIVITAMIN WITH MINERALS) tablet Take 1 tablet by mouth daily.      Marland Kitchen NINLARO 3 MG capsule TAKE 1 CAP (3 MG) BY MOUTH WEEKLY, 3 WEEKS ON, 1 WEEK OFF, REPEAT EVERY 4 WEEKS. TAKE ON AN EMPTY STOMACH 1HR BEFORE OR 2HR AFTER MEAL 3 capsule 2  . oxyCODONE-acetaminophen (PERCOCET) 5-325 MG tablet Take 1-2 tablets by mouth every 4 (four) hours as needed for moderate pain or severe pain. 90 tablet 0  . polyethylene glycol (MIRALAX) packet Take 17 g by mouth daily. 30 each 1  . senna-docusate (SENOKOT-S) 8.6-50 MG tablet Take 2 tablets by mouth at bedtime. 60 tablet 2  . vitamin C (ASCORBIC ACID) 500 MG tablet Take 500 mg by mouth daily.     No current facility-administered medications for this visit.    REVIEW OF SYSTEMS:   A 10+ POINT REVIEW OF SYSTEMS WAS OBTAINED including neurology, dermatology, psychiatry, cardiac, respiratory, lymph, extremities, GI, GU, Musculoskeletal, constitutional, breasts, reproductive, HEENT.  All pertinent positives are noted in the HPI.  All others are negative.   PHYSICAL EXAMINATION: ECOG PERFORMANCE STATUS: 1-2  Vitals:   04/24/20 0952  BP: (!) 176/94  Pulse: 67  Resp: 18  Temp: 98.2 F (36.8 C)  SpO2: 98%   Filed Weights   04/24/20 0952  Weight: 106 lb 4.8 oz (48.2 kg)   Body mass index is 19.13 kg/m.   Exam was given in a chair   GENERAL:alert, in no acute distress and comfortable SKIN: no acute rashes, no significant lesions EYES:  conjunctiva are pink and non-injected, sclera anicteric OROPHARYNX: MMM, no exudates, no oropharyngeal erythema or ulceration NECK: supple, no JVD LYMPH:  no palpable lymphadenopathy in the cervical, axillary or inguinal regions LUNGS: clear to auscultation b/l with normal respiratory effort HEART: regular rate & rhythm ABDOMEN:  normoactive bowel sounds , non tender, not distended. No palpable hepatosplenomegaly.  Extremity: no pedal edema PSYCH: alert & oriented x 3 with fluent speech NEURO: no focal motor/sensory deficits  LABORATORY DATA:  I have reviewed the data as listed  . CBC Latest Ref Rng & Units 04/17/2020 02/21/2020 12/27/2019  WBC 4.0 - 10.5 K/uL 4.2 4.3 4.3  Hemoglobin 12.0 - 15.0 g/dL 11.8(L) 12.2 12.0  Hematocrit 36 - 46 % 36.1 37.1 37.7  Platelets 150 - 400 K/uL 231 223 208    . CMP Latest Ref Rng & Units 04/17/2020 02/21/2020 12/27/2019  Glucose 70 - 99 mg/dL 105(H) 104(H) 103(H)  BUN 8 - 23 mg/dL _0 Creatinine 0.44 - 1.00 mg/dL 0.73 0.74 0.72  Sodium 135 - 145 mmol/L 140 140 143  Potassium 3.5 - 5.1 mmol/L 4.1 4.2 4.3  Chloride 98 - 111 mmol/L 104 104 105  CO2 22 - 32 mmol/L _1 Calcium 8.9 - 10.3 mg/dL 9.6 9.5 9.7  Total  Protein 6.5 - 8.1 g/dL 6.9 6.9 6.9  Total Bilirubin 0.3 - 1.2 mg/dL 0.5 0.4 0.3  Alkaline Phos 38 - 126 U/L 36(L) 35(L) 36(L)  AST 15 - 41 U/L _0 ALT 0 - 44 U/L _1 09/11/2019 Flow Pathology Report [id number]):   09/11/2019 Bone Marrow Report [phone]):    07/14/2019 Cytogenetics [phone]):    07/14/2019 Flow Pathology Report [id number]):   07/14/2019 BM Bx ([id number]):    12/01/18 BM Bx:   12/01/18 Flow Cytometry:       RADIOGRAPHIC STUDIES: I have personally reviewed the radiological images as listed and agreed with the findings in the report. No results found.   ASSESSMENT & PLAN:  79 y.o. female with  1. Multiple Myeloma with IgG Lambda specificity Labs upon  initial presentation from 12/01/18 CBC w/diff revealed HGB at 10.4 with MCV of 105.7. 11/28/18 CMP revealed Creatinine normal at 0.68 and Calcium normal at 8.9. 11/28/18 Beta 2 microglobulin at 2.65m (also a reading at 4.760mon the same day). 11/30/18 24HR UPEP revealed M spike at 75.1%. 11/28/18 SPEP revealed M spike of 4.6g. 11/28/18 Immunoglobulins revealed IgG elevated at 61813m 12/01/18 BM Bx revealed hypercellular bone marrow with 70-80% CD138 immunohistochemistry (44% aspirate) lambda-restricted plasma cells as well as a kappa restricted population of B-cells 11/27/18 MRI Thoracic Spine which revealed Multifocal marrow signal abnormality consistent with metastatic disease or multiple myeloma. The patient has multiple compression fractures most consistent with pathologic injuries. Extensive marrow signal abnormality makes determining age of the fractures difficult but edema is most intense in T3. 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 2. Areas of hypermetabolic disease identified in both lungs suggesting multiple myeloma involvement. 3. 1.9 cm calcified left thyroid nodule is hypermetabolic, but Indeterminate. 4. Colon is diffusely distended with gas and stool. Imaging features would be compatible with clinical constipation. 5.  Aortic Atherosclerois.  Pt describes grade II to III rash on her re-challenge from Revlimid with optimized pre-medications, and we discontinued Revlimid  Began Cytoxan with C4 Velcade and Dexamethasone  05/16/2019 M spike is down to 0.2g/dl showing continued improvement and a 90% reduction  -07/14/2019 BM Bx revealed BONE  MARROW: - Hypercellular marrow with residual plasma cell neoplasm (<10%) PERIPHERAL BLOOD: - Pancytopenia".   07/17/2019 PET/CT Whole Body Scan ([id number] "1. 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 2. Hypermetabolic calcified left thyroid nodule, as before, indeterminate. 3. Aortic atherosclerosis (ICD10-170.0). Coronary artery calcification."  09/11/2019 Bone Marrow Report ([id number]) revealed "BONE MARROW, ASPIRATE, CLOT, CORE:  -Variably cellular bone marrow with trilineage hematopoiesis and 2% plasma cells PERIPHERAL BLOOD:  -Pancytopenia".  09/11/2019 Flow Pathology Report ([id number] "-Predominance of T lymphocytes with relative abundance of CD8 positive cells  -No significant B-cell population identified -Relative abundance of natural killer cells".  09/14/2019 PET/CT ([id number] "1. No findings of active malignancy. 2. Numerous thoracolumbar compression fractures many of which may be associated with osteoporosis. 3. Calcified 1.5 cm in diameter left thyroid nodule with persistent elevated activity. A significant minority of hypermetabolic thyroid nodules can be malignant, if not previously worked up to then thyroid ultrasound  would be recommended as a next step. 4. There a few small lucent and small sclerotic lesions in the skeleton which are not hypermetabolic, possibilities include benign lesions or previously effectively treated myeloma. 5. Other imaging findings of potential clinical significance: Aortic Atherosclerosis (ICD10-I70.0). Coronary atherosclerosis. Prominent stool throughout the colon favors constipation. Hyperdense right renal lesion is probably a complex cyst but technically nonspecific."  2. 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 She does not currently have elevated lymphocyte counts on peripheral blood draws.   PLAN: -Discussed pt labwork, 04/17/20; lymphopenia, other blood counts are stable, blood chemistries look good, MMP shows no M Protein -No lab or clinical progression of Multiple Myeloma or Monoclonal B-cell Lymphocytosis at this time -The pt has no prohibitive toxicities from continuing Ninlaro (3 weeks on & 1 week off) at this time -Recommend pt avoid lifting heavy objects, use warm compresses, and receive back massages -Advised pt that there is no way to test antibodies against the COVID19 virus at this time. Advised that it is most likely that her COVID19 vaccine was somewhat protective.  -Recommend pt f/u with PCP for referral to an Orthopedist if back pain persists -Continue 5000 UT Vitamin D daily. Goal Vitamin D is 60 <>90  -Continue Zometa q8weeks  -Will see back in 8 weeks, with labs 1 week prior   FOLLOW UP: RTC with Dr Irene Limbo with labs and Zometa infusion in 8 weeks Please schedule the lab appointment 1 week prior to clinic visit    The total time spent in the appt was 30 minutes and more than 50% was on counseling and direct patient cares.  All of the patient's questions were answered with apparent satisfaction. The patient knows to call the clinic with any problems, questions or concerns.    Sullivan Lone MD [place] AAHIVMS [facility] Hematology/Oncology Physician [facility]  (Office):       [phone] (Work cell):  [phone] (Fax):           [phone]  04/24/2020 10:26 AM  I, Yevette Edwards, am acting as a scribe for Dr. Sullivan Lone.   .I have reviewed the above documentation for accuracy and completeness, and I agree with the above. [name] MD

## 2020-04-24 ENCOUNTER — Inpatient Hospital Stay: Payer: Medicare HMO

## 2020-04-24 ENCOUNTER — Other Ambulatory Visit: Payer: Medicare HMO

## 2020-04-24 ENCOUNTER — Inpatient Hospital Stay (HOSPITAL_BASED_OUTPATIENT_CLINIC_OR_DEPARTMENT_OTHER): Payer: Medicare HMO | Admitting: Hematology

## 2020-04-24 ENCOUNTER — Other Ambulatory Visit: Payer: Self-pay

## 2020-04-24 VITALS — BP 176/94 | HR 67 | Temp 98.2°F | Resp 18 | Ht 62.5 in | Wt 106.3 lb

## 2020-04-24 DIAGNOSIS — Z5111 Encounter for antineoplastic chemotherapy: Secondary | ICD-10-CM

## 2020-04-24 DIAGNOSIS — C9000 Multiple myeloma not having achieved remission: Secondary | ICD-10-CM

## 2020-04-24 DIAGNOSIS — Z79899 Other long term (current) drug therapy: Secondary | ICD-10-CM | POA: Diagnosis not present

## 2020-04-24 DIAGNOSIS — K5900 Constipation, unspecified: Secondary | ICD-10-CM | POA: Diagnosis not present

## 2020-04-24 DIAGNOSIS — M545 Low back pain: Secondary | ICD-10-CM | POA: Diagnosis not present

## 2020-04-24 DIAGNOSIS — Z7189 Other specified counseling: Secondary | ICD-10-CM

## 2020-04-24 DIAGNOSIS — D61818 Other pancytopenia: Secondary | ICD-10-CM | POA: Diagnosis not present

## 2020-04-24 DIAGNOSIS — I10 Essential (primary) hypertension: Secondary | ICD-10-CM | POA: Diagnosis not present

## 2020-04-24 DIAGNOSIS — Z87891 Personal history of nicotine dependence: Secondary | ICD-10-CM | POA: Diagnosis not present

## 2020-04-24 DIAGNOSIS — M8580 Other specified disorders of bone density and structure, unspecified site: Secondary | ICD-10-CM | POA: Diagnosis not present

## 2020-04-24 MED ORDER — ZOLEDRONIC ACID 4 MG/100ML IV SOLN
4.0000 mg | Freq: Once | INTRAVENOUS | Status: AC
  Administered 2020-04-24: 4 mg via INTRAVENOUS

## 2020-04-24 MED ORDER — ZOLEDRONIC ACID 4 MG/100ML IV SOLN
INTRAVENOUS | Status: AC
  Filled 2020-04-24: qty 100

## 2020-04-24 MED ORDER — SODIUM CHLORIDE 0.9 % IV SOLN
Freq: Once | INTRAVENOUS | Status: AC
  Filled 2020-04-24: qty 250

## 2020-04-24 NOTE — Patient Instructions (Signed)
Zoledronic Acid injection (Hypercalcemia, Oncology) What is this medicine? ZOLEDRONIC ACID (ZOE le dron ik AS id) lowers the amount of calcium loss from bone. It is used to treat too much calcium in your blood from cancer. It is also used to prevent complications of cancer that has spread to the bone. This medicine may be used for other purposes; ask your health care provider or pharmacist if you have questions. COMMON BRAND NAME(S): Zometa What should I tell my health care provider before I take this medicine? They need to know if you have any of these conditions:  aspirin-sensitive asthma  cancer, especially if you are receiving medicines used to treat cancer  dental disease or wear dentures  infection  kidney disease  receiving corticosteroids like dexamethasone or prednisone  an unusual or allergic reaction to zoledronic acid, other medicines, foods, dyes, or preservatives  pregnant or trying to get pregnant  breast-feeding How should I use this medicine? This medicine is for infusion into a vein. It is given by a health care professional in a hospital or clinic setting. Talk to your pediatrician regarding the use of this medicine in children. Special care may be needed. Overdosage: If you think you have taken too much of this medicine contact a poison control center or emergency room at once. NOTE: This medicine is only for you. Do not share this medicine with others. What if I miss a dose? It is important not to miss your dose. Call your doctor or health care professional if you are unable to keep an appointment. What may interact with this medicine?  certain antibiotics given by injection  NSAIDs, medicines for pain and inflammation, like ibuprofen or naproxen  some diuretics like bumetanide, furosemide  teriparatide  thalidomide This list may not describe all possible interactions. Give your health care provider a list of all the medicines, herbs, non-prescription  drugs, or dietary supplements you use. Also tell them if you smoke, drink alcohol, or use illegal drugs. Some items may interact with your medicine. What should I watch for while using this medicine? Visit your doctor or health care professional for regular checkups. It may be some time before you see the benefit from this medicine. Do not stop taking your medicine unless your doctor tells you to. Your doctor may order blood tests or other tests to see how you are doing. Women should inform their doctor if they wish to become pregnant or think they might be pregnant. There is a potential for serious side effects to an unborn child. Talk to your health care professional or pharmacist for more information. You should make sure that you get enough calcium and vitamin D while you are taking this medicine. Discuss the foods you eat and the vitamins you take with your health care professional. Some people who take this medicine have severe bone, joint, and/or muscle pain. This medicine may also increase your risk for jaw problems or a broken thigh bone. Tell your doctor right away if you have severe pain in your jaw, bones, joints, or muscles. Tell your doctor if you have any pain that does not go away or that gets worse. Tell your dentist and dental surgeon that you are taking this medicine. You should not have major dental surgery while on this medicine. See your dentist to have a dental exam and fix any dental problems before starting this medicine. Take good care of your teeth while on this medicine. Make sure you see your dentist for regular follow-up   appointments. What side effects may I notice from receiving this medicine? Side effects that you should report to your doctor or health care professional as soon as possible:  allergic reactions like skin rash, itching or hives, swelling of the face, lips, or tongue  anxiety, confusion, or depression  breathing problems  changes in vision  eye  pain  feeling faint or lightheaded, falls  jaw pain, especially after dental work  mouth sores  muscle cramps, stiffness, or weakness  redness, blistering, peeling or loosening of the skin, including inside the mouth  trouble passing urine or change in the amount of urine Side effects that usually do not require medical attention (report to your doctor or health care professional if they continue or are bothersome):  bone, joint, or muscle pain  constipation  diarrhea  fever  hair loss  irritation at site where injected  loss of appetite  nausea, vomiting  stomach upset  trouble sleeping  trouble swallowing  weak or tired This list may not describe all possible side effects. Call your doctor for medical advice about side effects. You may report side effects to FDA at 1-800-FDA-1088. Where should I keep my medicine? This drug is given in a hospital or clinic and will not be stored at home. NOTE: This sheet is a summary. It may not cover all possible information. If you have questions about this medicine, talk to your doctor, pharmacist, or health care provider.  2020 [employer] (2014-02-17 14:19:39)  

## 2020-04-26 ENCOUNTER — Other Ambulatory Visit: Payer: Self-pay | Admitting: Hematology

## 2020-04-26 ENCOUNTER — Telehealth: Payer: Self-pay | Admitting: Hematology

## 2020-04-26 DIAGNOSIS — C9000 Multiple myeloma not having achieved remission: Secondary | ICD-10-CM

## 2020-04-26 MED ORDER — FENTANYL 25 MCG/HR TD PT72: 1.0000 | MEDICATED_PATCH | TRANSDERMAL | 0 refills | Status: DC

## 2020-04-26 NOTE — Telephone Encounter (Signed)
Scheduled per 07/21 los, patient has been called and notified. °

## 2020-04-29 DIAGNOSIS — H401122 Primary open-angle glaucoma, left eye, moderate stage: Secondary | ICD-10-CM | POA: Diagnosis not present

## 2020-04-29 DIAGNOSIS — H401112 Primary open-angle glaucoma, right eye, moderate stage: Secondary | ICD-10-CM | POA: Diagnosis not present

## 2020-05-16 MED FILL — NINLARO 3 MG CAPS: 3 | 28 days supply | Qty: 3 | Fill #2

## 2020-05-28 ENCOUNTER — Encounter: Payer: Self-pay | Admitting: Hematology

## 2020-05-29 ENCOUNTER — Other Ambulatory Visit: Payer: Self-pay | Admitting: Hematology

## 2020-05-29 DIAGNOSIS — C9000 Multiple myeloma not having achieved remission: Secondary | ICD-10-CM

## 2020-05-29 MED ORDER — OXYCODONE-ACETAMINOPHEN 5-325 MG PO TABS: 1.0000 | ORAL_TABLET | ORAL | 0 refills | Status: DC | PRN

## 2020-05-29 MED ORDER — FENTANYL 25 MCG/HR TD PT72: 1.0000 | MEDICATED_PATCH | TRANSDERMAL | 0 refills | Status: DC

## 2020-06-11 ENCOUNTER — Other Ambulatory Visit: Payer: Self-pay | Admitting: Hematology

## 2020-06-13 ENCOUNTER — Other Ambulatory Visit: Payer: Self-pay

## 2020-06-13 ENCOUNTER — Inpatient Hospital Stay: Payer: Medicare HMO | Attending: Hematology

## 2020-06-13 DIAGNOSIS — Z7982 Long term (current) use of aspirin: Secondary | ICD-10-CM | POA: Diagnosis not present

## 2020-06-13 DIAGNOSIS — Z79899 Other long term (current) drug therapy: Secondary | ICD-10-CM | POA: Diagnosis not present

## 2020-06-13 DIAGNOSIS — M8580 Other specified disorders of bone density and structure, unspecified site: Secondary | ICD-10-CM | POA: Insufficient documentation

## 2020-06-13 DIAGNOSIS — Z87891 Personal history of nicotine dependence: Secondary | ICD-10-CM | POA: Insufficient documentation

## 2020-06-13 DIAGNOSIS — D72820 Lymphocytosis (symptomatic): Secondary | ICD-10-CM | POA: Diagnosis not present

## 2020-06-13 DIAGNOSIS — Z8249 Family history of ischemic heart disease and other diseases of the circulatory system: Secondary | ICD-10-CM | POA: Insufficient documentation

## 2020-06-13 DIAGNOSIS — Z5111 Encounter for antineoplastic chemotherapy: Secondary | ICD-10-CM

## 2020-06-13 DIAGNOSIS — Z801 Family history of malignant neoplasm of trachea, bronchus and lung: Secondary | ICD-10-CM | POA: Diagnosis not present

## 2020-06-13 DIAGNOSIS — I10 Essential (primary) hypertension: Secondary | ICD-10-CM | POA: Insufficient documentation

## 2020-06-13 DIAGNOSIS — C9000 Multiple myeloma not having achieved remission: Secondary | ICD-10-CM

## 2020-06-13 LAB — CMP (CANCER CENTER ONLY)
ALT: 15 U/L (ref 0–44)
AST: 18 U/L (ref 15–41)
Albumin: 4.2 g/dL (ref 3.5–5.0)
Alkaline Phosphatase: 39 U/L (ref 38–126)
Anion gap: 6 (ref 5–15)
BUN: 15 mg/dL (ref 8–23)
CO2: 29 mmol/L (ref 22–32)
Calcium: 9.6 mg/dL (ref 8.9–10.3)
Chloride: 106 mmol/L (ref 98–111)
Creatinine: 0.73 mg/dL (ref 0.44–1.00)
GFR, Est AFR Am: >60 mL/min (ref >60)
GFR, Estimated: >60 mL/min (ref >60)
Glucose, Bld: 105 mg/dL — ABNORMAL HIGH (ref 70–99)
Potassium: 4.3 mmol/L (ref 3.5–5.1)
Sodium: 141 mmol/L (ref 135–145)
Total Bilirubin: 0.3 mg/dL (ref 0.3–1.2)
Total Protein: 7.1 g/dL (ref 6.5–8.1)

## 2020-06-13 LAB — CBC WITH DIFFERENTIAL/PLATELET
Abs Immature Granulocytes: 0.01 10*3/uL (ref 0.00–0.07)
Basophils Absolute: 0.1 10*3/uL (ref 0.0–0.1)
Basophils Relative: 1 %
Eosinophils Absolute: 0.1 10*3/uL (ref 0.0–0.5)
Eosinophils Relative: 2 %
HCT: 36.8 % (ref 36.0–46.0)
Hemoglobin: 11.9 g/dL — ABNORMAL LOW (ref 12.0–15.0)
Immature Granulocytes: 0 %
Lymphocytes Relative: 9 %
Lymphs Abs: 0.4 10*3/uL — ABNORMAL LOW (ref 0.7–4.0)
MCH: 32.8 pg (ref 26.0–34.0)
MCHC: 32.3 g/dL (ref 30.0–36.0)
MCV: 101.4 fL — ABNORMAL HIGH (ref 80.0–100.0)
Monocytes Absolute: 0.5 10*3/uL (ref 0.1–1.0)
Monocytes Relative: 10 %
Neutro Abs: 4.1 10*3/uL (ref 1.7–7.7)
Neutrophils Relative %: 78 %
Platelets: 249 10*3/uL (ref 150–400)
RBC: 3.63 MIL/uL — ABNORMAL LOW (ref 3.87–5.11)
RDW: 13.8 % (ref 11.5–15.5)
WBC: 5.2 10*3/uL (ref 4.0–10.5)
nRBC: 0 % (ref 0.0–0.2)

## 2020-06-13 LAB — VITAMIN D 25 HYDROXY (VIT D DEFICIENCY, FRACTURES): Vit D, 25-Hydroxy: 60.48 ng/mL (ref 30–100)

## 2020-06-13 MED FILL — NINLARO 3 MG CAPS: 3 | 28 days supply | Qty: 3 | Fill #0

## 2020-06-17 LAB — MULTIPLE MYELOMA PANEL, SERUM
Albumin SerPl Elph-Mcnc: 4.1 g/dL (ref 2.9–4.4)
Albumin/Glob SerPl: 1.5 (ref 0.7–1.7)
Alpha 1: 0.3 g/dL (ref 0.0–0.4)
Alpha2 Glob SerPl Elph-Mcnc: 0.8 g/dL (ref 0.4–1.0)
B-Globulin SerPl Elph-Mcnc: 1.2 g/dL (ref 0.7–1.3)
Gamma Glob SerPl Elph-Mcnc: 0.4 g/dL (ref 0.4–1.8)
Globulin, Total: 2.8 g/dL (ref 2.2–3.9)
IgA: 25 mg/dL — ABNORMAL LOW (ref 64–422)
IgG (Immunoglobin G), Serum: 618 mg/dL (ref 586–1602)
IgM (Immunoglobulin M), Srm: 24 mg/dL — ABNORMAL LOW (ref 26–217)
Total Protein ELP: 6.9 g/dL (ref 6.0–8.5)

## 2020-06-18 ENCOUNTER — Ambulatory Visit: Payer: Medicare HMO | Admitting: Internal Medicine

## 2020-06-18 ENCOUNTER — Other Ambulatory Visit: Payer: Self-pay

## 2020-06-18 ENCOUNTER — Encounter: Payer: Self-pay | Admitting: Internal Medicine

## 2020-06-18 VITALS — BP 180/96 | HR 76 | Ht 62.5 in | Wt 108.2 lb

## 2020-06-18 DIAGNOSIS — E041 Nontoxic single thyroid nodule: Secondary | ICD-10-CM | POA: Diagnosis not present

## 2020-06-18 DIAGNOSIS — C73 Malignant neoplasm of thyroid gland: Secondary | ICD-10-CM

## 2020-06-18 DIAGNOSIS — R7303 Prediabetes: Secondary | ICD-10-CM | POA: Diagnosis not present

## 2020-06-18 LAB — POCT GLYCOSYLATED HEMOGLOBIN (HGB A1C): Hemoglobin A1C: 5.9 % — AB (ref 4.0–5.6)

## 2020-06-18 NOTE — Progress Notes (Addendum)
Patient ID: Megan Olson, female   DOB: July 11, 1941, 79 y.o.   MRN: [id number]   This visit occurred during the SARS-CoV-2 public health emergency.  Safety protocols were in place, including screening questions prior to the visit, additional usage of staff PPE, and extensive cleaning of exam room while observing appropriate contact time as indicated for disinfecting solutions.   HPI  Megan Olson is a 79 y.o.-year-old very nice gentleman in female, referred by her PCP, Dr. Yong Channel, for evaluation for thyroid nodule and prediabetes.  Thyroid nodule:  Patient was noticed to have a FDG-avid thyroid nodule on recent PET/CT scans obtained for patient's multiple myeloma:  PET/CT (07/17/2019): CHEST: Persistent hypermetabolism in a partially calcified 1.8 cm left thyroid nodule, SUV max 4.5. No hypermetabolic mediastinal, hilar or axillary lymph nodes.  PET/CT (09/14/2019): HEAD/NECK: Calcified 1.5 cm in diameter left thyroid nodule maximum SUV 6.7, previously 7.7.  Pt denies: - feeling nodules in neck - hoarseness - dysphagia - choking - SOB with lying down  I reviewed pt's thyroid tests and they were normal: Lab Results  Component Value Date   TSH 1.006 09/19/2019   TSH 1.84 03/25/2016   TSH 1.47 03/22/2015   TSH 1.58 03/19/2014   TSH 1.79 03/08/2013   TSH 1.90 03/07/2012   TSH 2.04 02/17/2011   TSH 1.68 02/03/2010   TSH 2.15 09/05/2008   TSH 1.34 08/05/2007   FREET4 1.10 09/19/2019    Pt denies: - fatigue - heat intolerance/cold intolerance - tremors - palpitations - anxiety/depression - hyperdefecation/constipation - dry skin - hair loss  She does have: - weight loss since her myeloma dx - constipation (resolved on Miralax)  + FH of thyroid ds.: Hashimoto's thyroidism in sister. No FH of thyroid cancer. No h/o radiation tx to head or neck.  No seaweed or kelp. No steroid use. No Biotin supplements or Hair, Skin and Nails vitamins.  Prediabetes:  Reviewed previous  HbA1c levels: Lab Results  Component Value Date   HGBA1C 5.8 (H) 01/26/2020   HGBA1C 5.6 10/10/2018   HGBA1C 6.3 04/04/2018   HGBA1C 5.9 09/17/2017   HGBA1C 6.2 03/26/2017   HGBA1C 5.9 03/22/2015   Also, fasting CBGs have been slightly elevated: 103-105.  She does not check her blood sugars at home.  Not on Dexamethasone with ChTx anymore.  She has hyperlipidemia: Lab Results  Component Value Date   CHOL 220 (H) 01/26/2020   HDL 68 01/26/2020   LDLCALC 132 (H) 01/26/2020   TRIG 97 01/26/2020   CHOLHDL 3.2 01/26/2020  She is not on a statin.  Kidney function was normal: Lab Results  Component Value Date   BUN 15 06/13/2020   Lab Results  Component Value Date   CREATININE 0.73 06/13/2020  Not on ACE inhibitor or ARB.  She is on aspirin 81 mg daily.  She has a history of multiple myeloma, which is now in remission. On ChTx. Also, history of multilevel thoracic vertebral fractures.  ROS: Constitutional: + See HPI  Eyes: no blurry vision, no xerophthalmia ENT: no sore throat,  + see HPI Cardiovascular: no CP/SOB/palpitations/+ occas. leg swelling Respiratory: no cough/SOB Gastrointestinal: no N/V/D/ + C Musculoskeletal: no muscle/+ joint aches Skin: no rashes Neurological: no tremors/numbness/tingling/dizziness Psychiatric: no depression/anxiety  Past Medical History:  Diagnosis Date  . Cancer ([place])    multiple myeloma  . Glaucoma   . HYPERTENSION 03/11/2007  . OSTEOPENIA 03/11/2007   Past Surgical History:  Procedure Laterality Date  . BREAST EXCISIONAL BIOPSY Right  2000  . BREAST LUMPECTOMY  1990   benign  . DILATION AND CURETTAGE OF UTERUS     bleeding at menopause. No uterine cancer  . IR RADIOLOGIST EVAL & MGMT  12/13/2018  . TONSILLECTOMY     age 29   Social History   Socioeconomic History  . Marital status: Married    Spouse name: Not on file  . Number of children: Not on file  . Years of education: Not on file  . Highest education level:  Not on file  Occupational History  . Not on file  Tobacco Use  . Smoking status: Former Smoker    Packs/day: 0.50    Years: 7.00    Pack years: 3.50    Types: Cigarettes    Quit date: 01/04/1961    Years since quitting: 59.4  . Smokeless tobacco: Never Used  Vaping Use  . Vaping Use: Never used  Substance and Sexual Activity  . Alcohol use: Yes    Alcohol/week: 1.0 standard drink    Types: 1 Standard drinks or equivalent per week  . Drug use: No  . Sexual activity: Not Currently  Other Topics Concern  . Not on file  Social History Narrative   Married. Lives with husband (patient of Dr. Yong Channel). 1 son. No grandkids. 1 granddog.       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 Difficulty of Paying Living Expenses: Not on file  Food Insecurity:   . Worried About Charity fundraiser in the Last Year: Not on file  . Ran Out of Food in the Last Year: Not on file  Transportation Needs:   . Lack of Transportation (Medical): Not on file  . Lack of Transportation (Non-Medical): Not on file  Physical Activity:   . Days of Exercise per Week: Not on file  . Minutes of Exercise per Session: Not on file  Stress:   . Feeling of Stress : Not on file  Social Connections:   . Frequency of Communication with Friends and Family: Not on file  . Frequency of Social Gatherings with Friends and Family: Not on file  . Attends Religious Services: Not on file  . Active Member of Clubs or Organizations: Not on file  . Attends Archivist Meetings: Not on file  . Marital Status: Not on file  Intimate Partner Violence:   . Fear of Current or Ex-Partner: Not on file  . Emotionally Abused: Not on file  . Physically Abused: Not on file  . Sexually Abused: Not on file   Current Outpatient Medications on File  Prior to Visit  Medication Sig Dispense Refill  . acyclovir (ZOVIRAX) 400 MG tablet Take 1 tablet (400 mg total) by mouth 2 (two) times daily. 30 tablet 6  . ALPHAGAN P 0.1 % SOLN Apply 1 drop topically See admin instructions. Apply 2 drops in right eye 3 times a day    . amLODipine (NORVASC) 5 MG tablet Take 1 tablet (5 mg total) by mouth daily. 90 tablet 3  . aspirin EC 81 MG tablet Take 81 mg by mouth daily.    . bimatoprost (LUMIGAN) 0.03 % ophthalmic solution Place 1 drop into the right eye at bedtime.     . Cholecalciferol (VITAMIN D3) 1000 UNITS CAPS Take  3,000 Units by mouth daily.     Marland Kitchen co-enzyme Q-10 50 MG capsule Take 50 mg by mouth daily.      . dorzolamide-timolol (COSOPT) 22.3-6.8 MG/ML ophthalmic solution Place 2 drops into both eyes 2 (two) times daily.   11  . famotidine (PEPCID) 10 MG tablet Take 10 mg by mouth 2 (two) times daily.    . fentaNYL (DURAGESIC) 25 MCG/HR Place 1 patch onto the skin every 3 (three) days. 10 patch 0  . hydrOXYzine (ATARAX/VISTARIL) 25 MG tablet Take 1 tablet (25 mg total) by mouth 3 (three) times daily as needed. (Patient not taking: Reported on 01/26/2020) 30 tablet 0  . magnesium hydroxide (MILK OF MAGNESIA) 400 MG/5ML suspension Take 15 mLs by mouth daily as needed for mild constipation or moderate constipation. (Patient not taking: Reported on 01/26/2020) 355 mL 0  . Magnesium Oxide 500 MG TABS Take 1 tablet by mouth daily.      . Multiple Vitamins-Minerals (MULTIVITAMIN WITH MINERALS) tablet Take 1 tablet by mouth daily.      Marland Kitchen NINLARO 3 MG capsule TAKE 1 CAP (3 MG) BY MOUTH WEEKLY, 3 WEEKS ON, 1 WEEK OFF, REPEAT EVERY 4 WEEKS. TAKE ON AN EMPTY STOMACH 1HR BEFORE OR 2HR AFTER MEAL 3 capsule 2  . oxyCODONE-acetaminophen (PERCOCET) 5-325 MG tablet Take 1-2 tablets by mouth every 4 (four) hours as needed for moderate pain or severe pain. 90 tablet 0  . polyethylene glycol (MIRALAX) packet Take 17 g by mouth daily. 30 each 1  . senna-docusate  (SENOKOT-S) 8.6-50 MG tablet Take 2 tablets by mouth at bedtime. 60 tablet 2  . vitamin C (ASCORBIC ACID) 500 MG tablet Take 500 mg by mouth daily.    . [DISCONTINUED] prochlorperazine (COMPAZINE) 10 MG tablet Take 1 tablet (10 mg total) by mouth every 6 (six) hours as needed (Nausea or vomiting). 30 tablet 1   No current facility-administered medications on file prior to visit.   Allergies  Allergen Reactions  . Ace Inhibitors   . Benadryl [Diphenhydramine] Other (See Comments)    Heart races  . Diamox [Acetazolamide]     Hypotensive event at ophthalmologist  . Sulfamethoxazole     REACTION: rash  . Lenalidomide Rash  . Penicillins Rash    REACTION: rash Did it involve swelling of the face/tongue/throat, SOB, or low BP? Yes Did it involve sudden or severe rash/hives, skin peeling, or any reaction on the inside of your mouth or nose? No Did you need to seek medical attention at a hospital or doctor's office? No When did it last happen?more than 10 years ago If all above answers are "NO", may proceed with cephalosporin use.    Family History  Problem Relation Age of Onset  . Heart disease Mother        CHF mother died 78  . Arthritis Mother   . Glaucoma Mother        sister as well  . Alcohol abuse Father   . Suicidality Father   . Cancer Sister        lung cancer, smoker  . Heart disease Sister        aortic valve replacement  . Hyperlipidemia Brother   . Hypertension Brother   . COPD Brother   . Arthritis Sister   . Hypertension Sister   . Glaucoma Sister   . Hashimoto's thyroiditis Sister   . Hypertension Son   . Stroke Maternal Grandmother   . Colon cancer Neg Hx   .  Colon polyps Neg Hx     PE: BP (!) 180/96 (BP Location: Left Arm, Patient Position: Sitting, Cuff Size: Small)   Pulse 76   Ht 5' 2.5" (1.588 m)   Wt 108 lb 3.2 oz (49.1 kg)   SpO2 97%   BMI 19.47 kg/m  Wt Readings from Last 3 Encounters:  06/18/20 108 lb 3.2 oz (49.1 kg)  04/24/20  106 lb 4.8 oz (48.2 kg)  02/28/20 107 lb 12.8 oz (48.9 kg)   Constitutional: Thin, in NAD Eyes: PERRLA, EOMI, no exophthalmos ENT: moist mucous membranes, no thyromegaly but approximately 1 cm hard, mobile, thyroid nodule palpated in anterior left neck, no cervical lymphadenopathy Cardiovascular: RRR, No MRG Respiratory: CTA B Gastrointestinal: abdomen soft, NT, ND, BS+ Musculoskeletal: no deformities, strength intact in all 4;  Skin: moist, warm, no rashes Neurological: no tremor with outstretched hands, DTR normal in all 4  ASSESSMENT: 1. L Thyroid nodule  2.  Prediabetes  PLAN: 1. L Thyroid nodule - I reviewed the report of her 2 PET scans along with the patient. I pointed out that the left thyroid nodule is not large, however, it appears to be FDG avid, which indicates increased activity in the nodule.  Some of these nodules are overproducing hormones, some are sites of inflammation in the context of Hashimoto's thyroiditis, and some are malignant.  To further delineate this nodule, will need to obtain a thyroid ultrasound.  I discussed with the patient about benign ultrasound aspect of thyroid nodules: - not hypoechoic - without microcalcifications - without internal blood flow - more wide than tall - well delimited from surrounding tissue Pt does not have a thyroid cancer family history or a personal history of RxTx to head/neck. All these would favor benignity.  - After the results of the ultrasound returned, she may need to have a thyroid biopsy since this is the only way to assess for the presence of cancer. I explained what the test entails.  She agrees.  However, accessing this nodule may be a little difficult, since it appears to be calcified. - I explained that this is not cancer, we can continue to follow her without intervention, and check another ultrasound in another year or 2.  If stable ultrasound characteristics, no further investigation is needed - she should let me  know if she develops neck compression symptoms, in that case, we might need to do either lobectomy or thyroidectomy - We also discussed about the possibility of RAI treatment after the surgery, upon her questioning - I'll see her back in a year, assuming her FNA is normal. If FNA abnormal, we will meet sooner.   2.  Prediabetes -This is mild -At today's visit, HbA1c was slightly higher, at 5.9% -Discussed about the reversibility of the disease, as opposed to fully established diabetes -She continues to adjust her diet and includes food and trying to cut down on crackers -For now, I do not feel that she needs to start checking her blood sugars daily -We'll continue to keep an eye on her HbA1c levels.  If needed, we may use Metformin.  Orders Placed This Encounter  Procedures  . US THYROID   Thyroid U/S (06/24/2020): Parenchymal Echotexture: Mildly heterogenous Isthmus: Normal in size measuring 0.3 cm in diameter Right lobe: Atrophic in size measuring 3.7 x 1.3 x 1.5 cm Left lobe: Atrophic in size measuring 3.4 x 1.6 x 1.8 cm _________________________________________________________  Estimated total number of nodules >/= 1 cm: 2  Number of spongiform  nodules >/=  2 cm not described below (TR1): 0  Number of mixed cystic and solid nodules >/= 1.5 cm not described below (TR2): 0 _________________________________________________________  Scattered punctate (sub 5 mm) anechoic cysts and hypoechoic nodules within the right lobe of the thyroid, several which contain internal echogenic foci with ring down artifact compatible with benign colloid. None of these punctate nodules meet imaging criteria to recommend percutaneous sampling or continued dedicated follow-up. ________________________________________________________  Nodule # 1: Location: Left; Mid - this nodule appears hypermetabolic on preceding PET-CT Maximum size: 2.0 cm; Other 2 dimensions: 1.8 x 1.7 cm Composition:  cannot determine (2) Echogenicity: cannot determine (1) Shape: not taller-than-wide (0) Margins: lobulated/irregular (2) Echogenic foci: peripheral calcifications (2) Additional echogenic foci 1:  macrocalcifications (1)  **Given size (>/= 1.0 cm) and appearance, fine needle aspiration of this highly suspicious nodule should be considered based on TI-RADS criteria. _________________________________________________________  Nodule # 2: Location: Left; mid - this nodule appears hypermetabolic on preceding PET-CT Maximum size: 1.2 cm; Other 2 dimensions: 0.9 x 0.7 cm Composition: solid/almost completely solid (2) Echogenicity: hypoechoic (2) Shape: not taller-than-wide (0) Margins: lobulated/irregular (2) Echogenic foci: none (0) *Given size (>/= 1 - 1.4 cm) and appearance, a follow-up ultrasound in 1 year should be considered based on TI-RADS criteria. However - see below: Bx recommended. _________________________________________________________  There is an additional punctate (approximately 0.5 cm) anechoic cyst within left lobe of the thyroid which does not meet criteria to recommend percutaneous sampling or continued dedicated follow-up.  IMPRESSION: 1. Findings suggestive of multinodular goiter. 2. Nodule #1,correlating with one of hypermetabolic nodules seen on preceding PET-CT, meets imaging criteria to recommend percutaneous sampling as clinically indicated. 3. Nodule #2 meets TIRADS criteria to only recommend a 1 year follow-up, though as this nodule also appears hypermetabolic on preceding PET-CT, percutaneous sampling is also recommended for this nodule as hypermetabolic nodules have a higher rate of harboring malignancy. 4. None of the remaining thyroid nodules/cysts meet imaging criteria to recommend percutaneous sampling or continued dedicated follow-up.  Will recommend biopsy of the above 2 nodules.  FNAs (07/10/2009): Specimen Submitted: A.  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  She agrees with referral to surgery.  We discussed about the need for levothyroxine afterwards.  I sent her instructions about how to take this correctly.  We also briefly discussed about RAI treatment, which will most likely be needed after surgery.  Referred to Dr. Harlow Asa.  [name], MD PhD [facility] Endocrinology

## 2020-06-18 NOTE — Patient Instructions (Addendum)
We will schedule a thyroid ultrasound for you.  Please return in 1 year.   Thyroid Nodule  A thyroid nodule is an isolated growth of thyroid cells that forms a lump in your thyroid gland. The thyroid gland is a butterfly-shaped gland. It is found in the lower front of your neck. This gland sends chemical messengers (hormones) through your blood to all parts of your body. These hormones are important in regulating your body temperature and helping your body to use energy. Thyroid nodules are common. Most are not cancerous (benign). You may have one nodule or several nodules. Different types of thyroid nodules include nodules that:  Grow and fill with fluid (thyroid cysts).  Produce too much thyroid hormone (hot nodules or hyperthyroid).  Produce no thyroid hormone (cold nodules or hypothyroid).  Form from cancer cells (thyroid cancers). What are the causes? In most cases, the cause of this condition is not known. What increases the risk? The following factors may make you more likely to develop this condition.  Age. Thyroid nodules become more common in people who are older than 79 years of age.  Gender. ? Benign thyroid nodules are more common in women. ? Cancerous (malignant) thyroid nodules are more common in men.  A family history that includes: ? Thyroid nodules. ? Pheochromocytoma. ? Thyroid carcinoma. ? Hyperparathyroidism.  Certain kinds of thyroid diseases, such as Hashimoto's thyroiditis.  Lack of iodine in your diet.  A history of head and neck radiation, such as from previous cancer treatment. What are the signs or symptoms? In many cases, there are no symptoms. If you have symptoms, they may include:  A lump in your lower neck.  Feeling a lump or tickle in your throat.  Pain in your neck, jaw, or ear.  Having trouble swallowing. Hot nodules may cause symptoms that include:  Weight loss.  Warm, flushed skin.  Feeling hot.  Feeling nervous.  A  racing heartbeat. Cold nodules may cause symptoms that include:  Weight gain.  Dry skin.  Brittle hair. This may also occur with hair loss.  Feeling cold.  Fatigue. Thyroid cancer nodules may cause symptoms that include:  Hard nodules that feel stuck to the thyroid gland.  Hoarseness.  Lumps in the glands near your thyroid (lymph nodes). How is this diagnosed? A thyroid nodule may be felt by your health care provider during a physical exam. This condition may also be diagnosed based on your symptoms. You may also have tests, including:  An ultrasound. This may be done to confirm the diagnosis.  A biopsy. This involves taking a sample from the nodule and looking at it under a microscope.  Blood tests to make sure that your thyroid is working properly.  A thyroid scan. This test uses a radioactive tracer injected into a vein to create an image of the thyroid gland on a computer screen.  Imaging tests such as MRI or CT scan. These may be done if: ? Your nodule is large. ? Your nodule is blocking your airway. ? Cancer is suspected. How is this treated? Treatment depends on the cause and size of your nodule or nodules. If the nodule is benign, treatment may not be necessary. Your health care provider may monitor the nodule to see if it goes away without treatment. If the nodule continues to grow, is cancerous, or does not go away, treatment may be needed. Treatment may include:  Having a cystic nodule drained with a needle.  Ablation therapy. In this treatment,  alcohol is injected into the area of the nodule to destroy the cells. Ablation with heat (thermal ablation) may also be used.  Radioactive iodine. In this treatment, radioactive iodine is given as a pill or liquid that you drink. This substance causes the thyroid nodule to shrink.  Surgery to remove the nodule. Part or all of your thyroid gland may need to be removed as well.  Medicines. Follow these instructions at  home:  Pay attention to any changes in your nodule.  Take over-the-counter and prescription medicines only as told by your health care provider.  Keep all follow-up visits as told by your health care provider. This is important. Contact a health care provider if:  Your voice changes.  You have trouble swallowing.  You have pain in your neck, ear, or jaw that is getting worse.  Your nodule gets bigger.  Your nodule starts to make it harder for you to breathe.  Your muscles look like they are shrinking (muscle wasting). Get help right away if:  You have chest pain.  There is a loss of consciousness.  You have a sudden fever.  You feel confused.  You are seeing or hearing things that other people do not see or hear (having hallucinations).  You feel very weak.  You have mood swings.  You feel very restless.  You feel suddenly nauseous or throw up.  You suddenly have diarrhea. Summary  A thyroid nodule is an isolated growth of thyroid cells that forms a lump in your thyroid gland.  Thyroid nodules are common. Most are not cancerous (benign). You may have one nodule or several nodules.  Treatment depends on the cause and size of your nodule or nodules. If the nodule is benign, treatment may not be necessary.  Your health care provider may monitor the nodule to see if it goes away without treatment. If the nodule continues to grow, is cancerous, or does not go away, treatment may be needed. This information is not intended to replace advice given to you by your health care provider. Make sure you discuss any questions you have with your health care provider. Document Revised: 05/06/2018 Document Reviewed: 05/09/2018 Elsevier Patient Education  [place].

## 2020-06-18 NOTE — Addendum Note (Signed)
Addended by: Cardell Peach I on: 06/18/2020 01:00 PM   Modules accepted: Orders

## 2020-06-20 ENCOUNTER — Other Ambulatory Visit: Payer: Self-pay

## 2020-06-20 ENCOUNTER — Inpatient Hospital Stay: Payer: Medicare HMO

## 2020-06-20 ENCOUNTER — Inpatient Hospital Stay: Payer: Medicare HMO | Admitting: Hematology

## 2020-06-20 VITALS — BP 193/118 | HR 64 | Temp 98.7°F | Resp 18 | Ht 62.5 in | Wt 107.6 lb

## 2020-06-20 DIAGNOSIS — C9000 Multiple myeloma not having achieved remission: Secondary | ICD-10-CM | POA: Diagnosis not present

## 2020-06-20 DIAGNOSIS — Z801 Family history of malignant neoplasm of trachea, bronchus and lung: Secondary | ICD-10-CM | POA: Diagnosis not present

## 2020-06-20 DIAGNOSIS — Z79899 Other long term (current) drug therapy: Secondary | ICD-10-CM | POA: Diagnosis not present

## 2020-06-20 DIAGNOSIS — Z7189 Other specified counseling: Secondary | ICD-10-CM

## 2020-06-20 DIAGNOSIS — Z8249 Family history of ischemic heart disease and other diseases of the circulatory system: Secondary | ICD-10-CM | POA: Diagnosis not present

## 2020-06-20 DIAGNOSIS — Z5111 Encounter for antineoplastic chemotherapy: Secondary | ICD-10-CM

## 2020-06-20 DIAGNOSIS — D72820 Lymphocytosis (symptomatic): Secondary | ICD-10-CM | POA: Diagnosis not present

## 2020-06-20 DIAGNOSIS — Z87891 Personal history of nicotine dependence: Secondary | ICD-10-CM | POA: Diagnosis not present

## 2020-06-20 DIAGNOSIS — I10 Essential (primary) hypertension: Secondary | ICD-10-CM | POA: Diagnosis not present

## 2020-06-20 DIAGNOSIS — Z7982 Long term (current) use of aspirin: Secondary | ICD-10-CM | POA: Diagnosis not present

## 2020-06-20 DIAGNOSIS — M8580 Other specified disorders of bone density and structure, unspecified site: Secondary | ICD-10-CM | POA: Diagnosis not present

## 2020-06-20 MED ORDER — ZOLEDRONIC ACID 4 MG/100ML IV SOLN
INTRAVENOUS | Status: AC
  Filled 2020-06-20: qty 100

## 2020-06-20 MED ORDER — SODIUM CHLORIDE 0.9 % IV SOLN
INTRAVENOUS | Status: DC
  Filled 2020-06-20: qty 250

## 2020-06-20 MED ORDER — ZOLEDRONIC ACID 4 MG/100ML IV SOLN
4.0000 mg | Freq: Once | INTRAVENOUS | Status: AC
  Administered 2020-06-20: 4 mg via INTRAVENOUS

## 2020-06-20 NOTE — Progress Notes (Signed)
HEMATOLOGY/ONCOLOGY CLINIC NOTE  Date of Service: 06/20/2020  Patient Care Team: [name], MD as PCP - General (Family Medicine) [name], MD as Consulting Physician (Hematology) Bond, [name], MD as Referring Physician (Ophthalmology) [name], [name], MD as Consulting Physician (Hematology and Oncology)  Dr. Ronney Lion as [place] Specialist  [phone]  CHIEF COMPLAINTS/PURPOSE OF CONSULTATION:   Multiple Myeloma- continued management  HISTORY OF PRESENTING ILLNESS:   Megan Olson is a wonderful 79 y.o. female who has been referred to [place] by Dr. 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 She is accompanied today by her husband. The pt reports that she is doing well overall.   The pt notes that she was doing extensive yard work in October 2019, and began to feel back pain a few days after this, which she attributed to muscle pain. She recalls taking a deep breath, and developing sudden acute pain, and presented to care with her PCP's office. She began exercises for her back pain, without relief, then began PT without relief, then was referred to Dr. Paulla Fore in sports medicine in late January 2020. She had an XR which revealed a compression fracture at T11, then subsequently had an MRI, and a bone marrow biopsy. The pt notes that her back pain "seemed to move around." She endorses pain "up the whole left side" of her back.  The pt notes that she is not able to stand up straight due to her back pain, which she feels limits her ability to take a deep breath, and endorses pain exacerbation when she does take a deep breath. The pt needs assistance from sitting to lying from her husband. She is using about 3 Percocet a day.  The pt notes worsened constipation since beginning Percocet, and notes that her most recent laxative order was not covered by her insurance. She is using prune juice and milk  of magnesia. She took Senokot S BID without relief.  The pt reports that prior to her recent back pain, she had very few medical concerns. She endorses controlled BP, and has been monitored for DM but after closely watching her diet her A1C decreased to 5.8. She has glaucoma, and has had surgery in both eyes. Right eye with stent. The pt sees Dr. Edilia Bo at [facility] for her eye care. The pt notes that her vision has been recently "pretty good." The pt notes that she has been advised to limit treatment with steroids for her glaucoma. She denies heart or lung problems. Denies previous back problems. Last DEXA scan 3 years ago, and endorses osteopenia. She notes that she took Fosamax for 3-4 years, and stopped 5-6 years ago. She takes Vitamin D, a multivitamin, and magnesium.  The pt notes that she quit smoking cigarettes when she was 27, started when she was about 20. The pt consumes alcohol rarely, but not since beginning narcotics. She previously worked in [employer].  Of note prior to the patient's visit today, pt has had an MRI Thoracic Spine completed on 11/27/18 with results revealing Multifocal marrow signal abnormality consistent with metastatic disease or multiple myeloma. The patient has multiple compression fractures most consistent with pathologic injuries. Extensive marrow signal abnormality makes determining age of the fractures difficult but edema is most intense in T3. 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  Most recent  lab results (12/01/18) of CBC w/diff is as follows: all values are WNL except for RBC at 3.17, HGB at 10.4, HCT at 33.5, MCV at 105.7. 11/28/18 CMP revealed all values WNL except for Glucose at 105, Total Protein at 10.2, Albumin at 3.1 11/30/18 24HR UPEP revealed all values WNL except for M-spike at 75.1%. 11/28/18 Bega-2 microglobulin slightly elevated at 2.6 11/28/18 SPEP revealed  M spike at 4.6g 11/28/18 Immunoglobulins revealed IgG at 6181, IgA at 24, IgM at 16, and IgE at 6.  On review of systems, pt reports constipation, back pain, stable energy levels, ankle swelling, tenderness at T3, lower back pain, and denies abdominal pains, neck pain, and any other symptoms.   On PMHx the pt reports redundant colon, single hemorrhoid, glaucoma, HTN, osteopenia, Tonsillectomy, right eye stent and multiple eye surgeries. On Social Hx the pt reports rare alcohol use, smoked cigarettes between ages 37 and 7. 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 On Family Hx the pt reports sister died from small cell lung cancer and was a lifelong smoker, brother's daughter died of breast cancer with BRCA1 and BRCA2 mutations. Cousin with female breast cancer.    INTERVAL HISTORY:   Megan Olson returns today for management and evaluation of her multiple myeloma. The patient's last visit with [place] was on 04/24/2020. The pt reports that she is doing well overall.  The pt reports that some days are better than others, but she feels well overall. Pt has been very active in taking care of her lawn. She is scheduled for a US Thyroid on 09/20 with Dr. Cruzita Lederer.   Pt received the [place] booster on 06/03/20. She is going to schedule her annual flu vaccine today and plans to receive her second Shingles injection two weeks after her flu vaccine.   She reports some right hip pain which she attributes to Scoliosis. Pt is down to 1.5 Oxycodone per day in addition to the Fentanyl patch.   Lab results (06/13/20) of CBC w/diff and CMP is as follows: all values are WNL except for RBC at 3.63, Hgb at 11.9, MCV at 101.4, Lymphs Abs at 0.4K, Glucose at 105. 06/13/2020 Vitamin D 25 (OH) at 60.48 06/13/2020 MMP is as follows: all values are WNL except for IgA at 25, IgM at 24, IFE shows "IgG monoclonal protein with lambda light chain specificity."  On review of systems, pt reports right hip pain and denies abdominal pain,  leg swelling, new bone pain and any other symptoms.   MEDICAL HISTORY:  Past Medical History:  Diagnosis Date  . Cancer ([place])    multiple myeloma  . Glaucoma   . HYPERTENSION 03/11/2007  . Multiple myeloma ([place])   . OSTEOPENIA 03/11/2007    SURGICAL HISTORY: Past Surgical History:  Procedure Laterality Date  . BREAST EXCISIONAL BIOPSY Right 2000  . BREAST LUMPECTOMY  1990   benign  . DILATION AND CURETTAGE OF UTERUS     bleeding at menopause. No uterine cancer  . IR RADIOLOGIST EVAL & MGMT  12/13/2018  . TONSILLECTOMY     age 75    SOCIAL HISTORY: Social History   Socioeconomic History  . Marital status: Married    Spouse name: Not on file  . Number of children: 1  . Years of education: Not on file  . Highest education level: Not on file  Occupational History  . Occupation: Retired  Tobacco Use  . Smoking status: Former Smoker    Packs/day: 0.50    Years: 7.00  Pack years: 3.50    Types: Cigarettes    Quit date: 01/04/1961    Years since quitting: 59.4  . Smokeless tobacco: Never Used  Vaping Use  . Vaping Use: Never used  Substance and Sexual Activity  . Alcohol use: Yes    Alcohol/week: 1.0 standard drink    Types: 1 Standard drinks or equivalent per week  . Drug use: No  . Sexual activity: Not Currently  Other Topics Concern  . Not on file  Social History Narrative   Married. Lives with husband (patient of Dr. Yong Channel). 1 son. No grandkids. 1 granddog.       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 Difficulty of Paying Living Expenses: Not on file  Food Insecurity:   . Worried About Charity fundraiser in the Last Year: Not on file  . Ran Out of Food in the Last Year: Not on file  Transportation Needs:   . Lack of Transportation (Medical): Not on file  . Lack of  Transportation (Non-Medical): Not on file  Physical Activity:   . Days of Exercise per Week: Not on file  . Minutes of Exercise per Session: Not on file  Stress:   . Feeling of Stress : Not on file  Social Connections:   . Frequency of Communication with Friends and Family: Not on file  . Frequency of Social Gatherings with Friends and Family: Not on file  . Attends Religious Services: Not on file  . Active Member of Clubs or Organizations: Not on file  . Attends Archivist Meetings: Not on file  . Marital Status: Not on file  Intimate Partner Violence:   . Fear of Current or Ex-Partner: Not on file  . Emotionally Abused: Not on file  . Physically Abused: Not on file  . Sexually Abused: Not on file    FAMILY HISTORY: Family History  Problem Relation Age of Onset  . Heart disease Mother        CHF mother died 21  . Arthritis Mother   . Glaucoma Mother        sister as well  . Alcohol abuse Father   . Suicidality Father   . Cancer Sister        lung cancer, smoker  . Heart disease Sister        aortic valve replacement  . Hyperlipidemia Brother   . Hypertension Brother   . COPD Brother   . Arthritis Sister   . Hypertension Sister   . Glaucoma Sister   . Hashimoto's thyroiditis Sister   . Hypertension Son   . Stroke Maternal Grandmother   . Colon cancer Neg Hx   . Colon polyps Neg Hx     ALLERGIES:  is allergic to ace inhibitors, benadryl [diphenhydramine], diamox [acetazolamide], sulfamethoxazole, lenalidomide, and penicillins.   MEDICATIONS:  Current Outpatient Medications  Medication Sig Dispense Refill  . acyclovir (ZOVIRAX) 400 MG tablet Take 1 tablet (400 mg total) by mouth 2 (two) times daily. 30 tablet 6  . ALPHAGAN P 0.1 % SOLN Apply 1 drop topically See admin instructions. Apply 2 drops in right eye 3 times a day    . amLODipine (NORVASC) 5 MG tablet Take 1 tablet (5 mg total) by mouth daily. 90 tablet 3  .  aspirin EC 81 MG tablet Take 81 mg by  mouth daily.    . bimatoprost (LUMIGAN) 0.03 % ophthalmic solution Place 1 drop into the right eye at bedtime.     . Cholecalciferol (VITAMIN D3) 1000 UNITS CAPS Take 3,000 Units by mouth daily.     Marland Kitchen co-enzyme Q-10 50 MG capsule Take 50 mg by mouth daily.      . dorzolamide-timolol (COSOPT) 22.3-6.8 MG/ML ophthalmic solution Place 2 drops into both eyes 2 (two) times daily.   11  . famotidine (PEPCID) 10 MG tablet Take 10 mg by mouth 2 (two) times daily.    . fentaNYL (DURAGESIC) 25 MCG/HR Place 1 patch onto the skin every 3 (three) days. 10 patch 0  . hydrOXYzine (ATARAX/VISTARIL) 25 MG tablet Take 1 tablet (25 mg total) by mouth 3 (three) times daily as needed. 30 tablet 0  . magnesium hydroxide (MILK OF MAGNESIA) 400 MG/5ML suspension Take 15 mLs by mouth daily as needed for mild constipation or moderate constipation. 355 mL 0  . Magnesium Oxide 500 MG TABS Take 1 tablet by mouth daily.      . Multiple Vitamins-Minerals (MULTIVITAMIN WITH MINERALS) tablet Take 1 tablet by mouth daily.      Marland Kitchen NINLARO 3 MG capsule TAKE 1 CAP (3 MG) BY MOUTH WEEKLY, 3 WEEKS ON, 1 WEEK OFF, REPEAT EVERY 4 WEEKS. TAKE ON AN EMPTY STOMACH 1HR BEFORE OR 2HR AFTER MEAL 3 capsule 2  . oxyCODONE-acetaminophen (PERCOCET) 5-325 MG tablet Take 1-2 tablets by mouth every 4 (four) hours as needed for moderate pain or severe pain. 90 tablet 0  . polyethylene glycol (MIRALAX) packet Take 17 g by mouth daily. 30 each 1  . senna-docusate (SENOKOT-S) 8.6-50 MG tablet Take 2 tablets by mouth at bedtime. 60 tablet 2  . vitamin C (ASCORBIC ACID) 500 MG tablet Take 500 mg by mouth daily.     No current facility-administered medications for this visit.    REVIEW OF SYSTEMS:   A 10+ POINT REVIEW OF SYSTEMS WAS OBTAINED including neurology, dermatology, psychiatry, cardiac, respiratory, lymph, extremities, GI, GU, Musculoskeletal, constitutional, breasts, reproductive, HEENT.  All pertinent positives are noted in the HPI.  All others  are negative.   PHYSICAL EXAMINATION: ECOG PERFORMANCE STATUS: 1-2  Vitals:   06/20/20 1018  BP: (!) 193/118  Pulse: 64  Resp: 18  Temp: 98.7 F (37.1 C)  SpO2: 98%   Filed Weights   06/20/20 1018  Weight: 107 lb 9.6 oz (48.8 kg)   Body mass index is 19.37 kg/m.   GENERAL:alert, in no acute distress and comfortable SKIN: no acute rashes, no significant lesions EYES: conjunctiva are pink and non-injected, sclera anicteric OROPHARYNX: MMM, no exudates, no oropharyngeal erythema or ulceration NECK: supple, no JVD LYMPH:  no palpable lymphadenopathy in the cervical, axillary or inguinal regions LUNGS: clear to auscultation b/l with normal respiratory effort HEART: regular rate & rhythm ABDOMEN:  normoactive bowel sounds , non tender, not distended. No palpable hepatosplenomegaly.  Extremity: no pedal edema PSYCH: alert & oriented x 3 with fluent speech NEURO: no focal motor/sensory deficits  LABORATORY DATA:  I have reviewed the data as listed  . CBC Latest Ref Rng & Units 06/13/2020 04/17/2020 02/21/2020  WBC 4.0 - 10.5 K/uL 5.2 4.2 4.3  Hemoglobin 12.0 - 15.0 g/dL 11.9(L) 11.8(L) 12.2  Hematocrit 36 - 46 % 36.8 36.1 37.1  Platelets 150 - 400 K/uL 249 231 223    . CMP Latest Ref Rng &  Units 06/13/2020 04/17/2020 02/21/2020  Glucose 70 - 99 mg/dL 105(H) 105(H) 104(H)  BUN 8 - 23 mg/dL 15 14 14   Creatinine 0.44 - 1.00 mg/dL 0.73 0.73 0.74  Sodium 135 - 145 mmol/L 141 140 140  Potassium 3.5 - 5.1 mmol/L 4.3 4.1 4.2  Chloride 98 - 111 mmol/L 106 104 104  CO2 22 - 32 mmol/L 29 28 28   Calcium 8.9 - 10.3 mg/dL 9.6 9.6 9.5  Total Protein 6.5 - 8.1 g/dL 7.1 6.9 6.9  Total Bilirubin 0.3 - 1.2 mg/dL 0.3 0.5 0.4  Alkaline Phos 38 - 126 U/L 39 36(L) 35(L)  AST 15 - 41 U/L 18 18 20   ALT 0 - 44 U/L 15 14 17    09/11/2019 Flow Pathology Report [phone]):   09/11/2019 Bone Marrow Report [id number]):    07/14/2019 Cytogenetics [phone]):    07/14/2019 Flow  Pathology Report [id number]):   07/14/2019 BM Bx ([id number]):    12/01/18 BM Bx:   12/01/18 Flow Cytometry:       RADIOGRAPHIC STUDIES: I have personally reviewed the radiological images as listed and agreed with the findings in the report. No results found.   ASSESSMENT & PLAN:  79 y.o. female with  1. Multiple Myeloma with IgG Lambda specificity Labs upon initial presentation from 12/01/18 CBC w/diff revealed HGB at 10.4 with MCV of 105.7. 11/28/18 CMP revealed Creatinine normal at 0.68 and Calcium normal at 8.9. 11/28/18 Beta 2 microglobulin at 2.6mg  (also a reading at 4.7mg  on the same day). 11/30/18 24HR UPEP revealed M spike at 75.1%. 11/28/18 SPEP revealed M spike of 4.6g. 11/28/18 Immunoglobulins revealed IgG elevated at 6181mg .  12/01/18 BM Bx revealed hypercellular bone marrow with 70-80% CD138 immunohistochemistry (44% aspirate) lambda-restricted plasma cells as well as a kappa restricted population of B-cells 11/27/18 MRI Thoracic Spine which revealed Multifocal marrow signal abnormality consistent with metastatic disease or multiple myeloma. The patient has multiple compression fractures most consistent with pathologic injuries. Extensive marrow signal abnormality makes determining age of the fractures difficult but edema is most intense in T3. 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 2. Areas of hypermetabolic disease identified in both lungs suggesting multiple myeloma involvement. 3. 1.9 cm calcified left thyroid nodule is hypermetabolic, but Indeterminate. 4. Colon is diffusely distended with gas and stool. Imaging  features would be compatible with clinical constipation. 5.  Aortic Atherosclerois.  Pt describes grade II to III rash on her re-challenge from Revlimid with optimized pre-medications, and we discontinued Revlimid  Began Cytoxan with C4 Velcade and Dexamethasone  05/16/2019 M spike is down to 0.2g/dl showing continued improvement and a 90% reduction  -07/14/2019 BM Bx revealed BONE MARROW: - Hypercellular marrow with residual plasma cell neoplasm (<10%) PERIPHERAL BLOOD: - Pancytopenia".   07/17/2019 PET/CT Whole Body Scan ([id number]) revealed "1. 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 2. Hypermetabolic calcified left thyroid nodule, as before, indeterminate. 3. Aortic atherosclerosis (ICD10-170.0). Coronary artery calcification."  09/11/2019 Bone Marrow Report ([id number]) revealed "BONE MARROW, ASPIRATE, CLOT, CORE:  -Variably cellular bone marrow with trilineage hematopoiesis and 2% plasma cells PERIPHERAL BLOOD:  -Pancytopenia".  09/11/2019 Flow Pathology Report [id number]) revealed "-Predominance  of T lymphocytes with relative abundance of CD8 positive cells  -No significant B-cell population identified -Relative abundance of natural killer cells".  09/14/2019 PET/CT ([id number]) revealed "1. No findings of active malignancy. 2. Numerous thoracolumbar compression fractures many of which may be associated with osteoporosis. 3. Calcified 1.5 cm in diameter left thyroid nodule with persistent elevated activity. A significant minority of hypermetabolic thyroid nodules can be malignant, if not previously worked up to then thyroid ultrasound would be recommended as a next step. 4. There a few small lucent and small sclerotic lesions in the skeleton which are not hypermetabolic, possibilities include benign lesions or previously effectively treated myeloma. 5. Other imaging findings of potential clinical significance: Aortic  Atherosclerosis (ICD10-I70.0). Coronary atherosclerosis. Prominent stool throughout the colon favors constipation. Hyperdense right renal lesion is probably a complex cyst but technically nonspecific."  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 She does not currently have elevated lymphocyte counts on peripheral blood draws.   PLAN: -Discussed pt labwork, 06/13/20; blood counts are steady, blood chemistries are nml, Vitamin D is at goal, no M Protein observed.  -No lab or clinical progression of Multiple Myeloma or Monoclonal B-cell Lymphocytosis at this time. -The pt has no prohibitive toxicities from continuing Ninlaro (3 weeks on & 1 week off) at this time.  -Will decrease Fentanyl patch dose to 12.5 mcg/hr. Continue Oxycodone prn.  -Continue 5000 UT Vitamin D daily. Goal Vitamin D is 60 <>90  -Continue Zometa q8weeks  -Refill Fentanyl patch  -Will see back in 8 weeks with labs   FOLLOW UP: RTC with Dr Irene Limbo with labs and Zometa infusion in 8 weeks Please schedule the lab appointment 1 week prior to clinic visit  (patient prefers Wednesday appointments -- cannot do thursdays)   The total time spent in the appt was 20 minutes and more than 50% was on counseling and direct patient cares.  All of the patient's questions were answered with apparent satisfaction. The patient knows to call the clinic with any problems, questions or concerns.    Sullivan Lone MD [place] AAHIVMS [facility] Hematology/Oncology [facility]  (Office):       [phone] (Work cell):  [phone] (Fax):           [phone]  06/20/2020 10:55 AM  I, Yevette Edwards, am acting as a scribe for Dr. Sullivan Lone.   I have reviewed the above documentation for accuracy and completeness, and I agree with the  above. [name] MD

## 2020-06-20 NOTE — Patient Instructions (Signed)
Zoledronic Acid injection (Hypercalcemia, Oncology) What is this medicine? ZOLEDRONIC ACID (ZOE le dron ik AS id) lowers the amount of calcium loss from bone. It is used to treat too much calcium in your blood from cancer. It is also used to prevent complications of cancer that has spread to the bone. This medicine may be used for other purposes; ask your health care provider or pharmacist if you have questions. COMMON BRAND NAME(S): Zometa What should I tell my health care provider before I take this medicine? They need to know if you have any of these conditions:  aspirin-sensitive asthma  cancer, especially if you are receiving medicines used to treat cancer  dental disease or wear dentures  infection  kidney disease  receiving corticosteroids like dexamethasone or prednisone  an unusual or allergic reaction to zoledronic acid, other medicines, foods, dyes, or preservatives  pregnant or trying to get pregnant  breast-feeding How should I use this medicine? This medicine is for infusion into a vein. It is given by a health care professional in a hospital or clinic setting. Talk to your pediatrician regarding the use of this medicine in children. Special care may be needed. Overdosage: If you think you have taken too much of this medicine contact a poison control center or emergency room at once. NOTE: This medicine is only for you. Do not share this medicine with others. What if I miss a dose? It is important not to miss your dose. Call your doctor or health care professional if you are unable to keep an appointment. What may interact with this medicine?  certain antibiotics given by injection  NSAIDs, medicines for pain and inflammation, like ibuprofen or naproxen  some diuretics like bumetanide, furosemide  teriparatide  thalidomide This list may not describe all possible interactions. Give your health care provider a list of all the medicines, herbs, non-prescription  drugs, or dietary supplements you use. Also tell them if you smoke, drink alcohol, or use illegal drugs. Some items may interact with your medicine. What should I watch for while using this medicine? Visit your doctor or health care professional for regular checkups. It may be some time before you see the benefit from this medicine. Do not stop taking your medicine unless your doctor tells you to. Your doctor may order blood tests or other tests to see how you are doing. Women should inform their doctor if they wish to become pregnant or think they might be pregnant. There is a potential for serious side effects to an unborn child. Talk to your health care professional or pharmacist for more information. You should make sure that you get enough calcium and vitamin D while you are taking this medicine. Discuss the foods you eat and the vitamins you take with your health care professional. Some people who take this medicine have severe bone, joint, and/or muscle pain. This medicine may also increase your risk for jaw problems or a broken thigh bone. Tell your doctor right away if you have severe pain in your jaw, bones, joints, or muscles. Tell your doctor if you have any pain that does not go away or that gets worse. Tell your dentist and dental surgeon that you are taking this medicine. You should not have major dental surgery while on this medicine. See your dentist to have a dental exam and fix any dental problems before starting this medicine. Take good care of your teeth while on this medicine. Make sure you see your dentist for regular follow-up   appointments. What side effects may I notice from receiving this medicine? Side effects that you should report to your doctor or health care professional as soon as possible:  allergic reactions like skin rash, itching or hives, swelling of the face, lips, or tongue  anxiety, confusion, or depression  breathing problems  changes in vision  eye  pain  feeling faint or lightheaded, falls  jaw pain, especially after dental work  mouth sores  muscle cramps, stiffness, or weakness  redness, blistering, peeling or loosening of the skin, including inside the mouth  trouble passing urine or change in the amount of urine Side effects that usually do not require medical attention (report to your doctor or health care professional if they continue or are bothersome):  bone, joint, or muscle pain  constipation  diarrhea  fever  hair loss  irritation at site where injected  loss of appetite  nausea, vomiting  stomach upset  trouble sleeping  trouble swallowing  weak or tired This list may not describe all possible side effects. Call your doctor for medical advice about side effects. You may report side effects to FDA at 1-800-FDA-1088. Where should I keep my medicine? This drug is given in a hospital or clinic and will not be stored at home. NOTE: This sheet is a summary. It may not cover all possible information. If you have questions about this medicine, talk to your doctor, pharmacist, or health care provider.  2020 [employer] (2014-02-17 14:19:39)  

## 2020-06-24 ENCOUNTER — Ambulatory Visit
Admission: RE | Admit: 2020-06-24 | Discharge: 2020-06-24 | Disposition: A | Discharge status: Home / Self Care | Payer: Medicare HMO | Source: Ambulatory Visit | Attending: Internal Medicine | Admitting: Internal Medicine

## 2020-06-24 DIAGNOSIS — E041 Nontoxic single thyroid nodule: Secondary | ICD-10-CM | POA: Diagnosis not present

## 2020-06-26 ENCOUNTER — Other Ambulatory Visit: Payer: Self-pay | Admitting: *Deleted

## 2020-06-26 ENCOUNTER — Telehealth: Payer: Self-pay

## 2020-06-26 ENCOUNTER — Other Ambulatory Visit: Payer: Self-pay | Admitting: Hematology

## 2020-06-26 DIAGNOSIS — C9000 Multiple myeloma not having achieved remission: Secondary | ICD-10-CM

## 2020-06-26 MED ORDER — FENTANYL 12 MCG/HR TD PT72
1.0000 | MEDICATED_PATCH | TRANSDERMAL | 0 refills | Status: DC
Start: 2020-06-26 — End: 2020-07-03

## 2020-06-26 NOTE — Telephone Encounter (Signed)
Pt returned call to confirm she agrees to biopsy at GI downstairs from [facility] office. Pt has concerns, questions about procedure. Please call pt [phone].

## 2020-06-26 NOTE — Telephone Encounter (Signed)
M, Can you please see if you can answer these or see if you can send these to me if not? Ty, C

## 2020-06-27 DIAGNOSIS — H401122 Primary open-angle glaucoma, left eye, moderate stage: Secondary | ICD-10-CM | POA: Diagnosis not present

## 2020-06-27 DIAGNOSIS — H401112 Primary open-angle glaucoma, right eye, moderate stage: Secondary | ICD-10-CM | POA: Diagnosis not present

## 2020-06-27 IMAGING — CT CT BIOPSY
1 of 2 series · 15 of 28 positions shown, 19 images · non-contrast
Comparison: none

INDICATION: 78-year-old female with a history of multiple myeloma status post
induction therapy. Evaluate prior to bone marrow transplantation.

[Series 2: i-spiral 5.0 b40f · axial · 0.98mm/px · z∈[+1465,+1542]mm · 15 of 25 slices shown, 19 images]
[im 2/25  mediastinal]
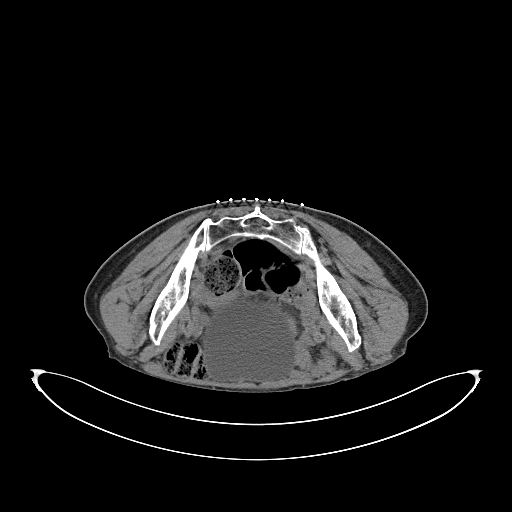
[im 2/25  lung]
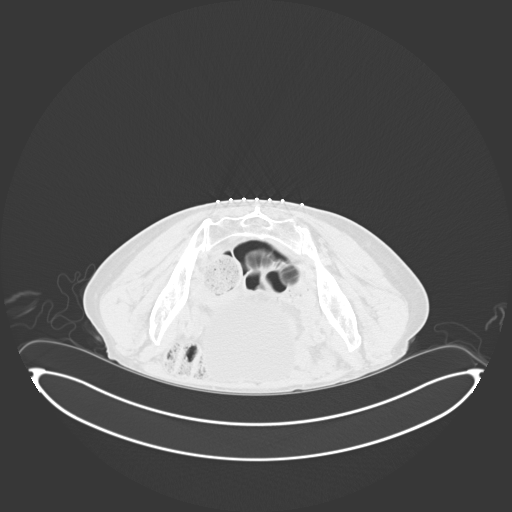
[im 4/25  lung]
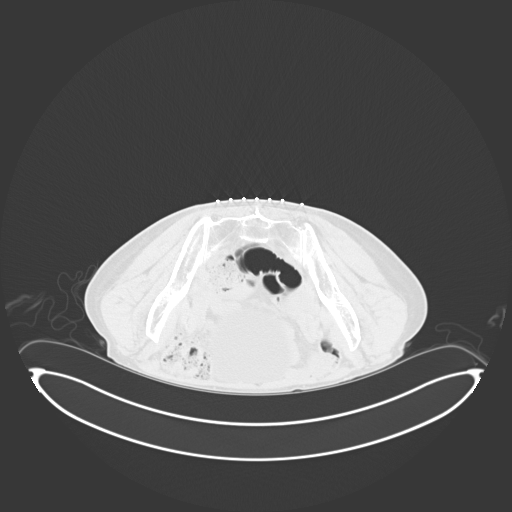
[im 5/25  lung]
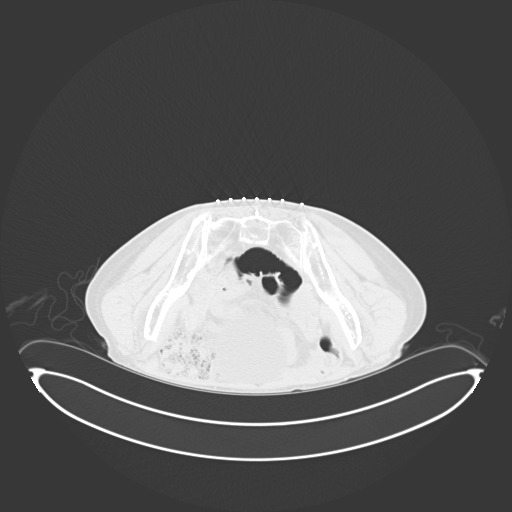
[im 7/25  lung]
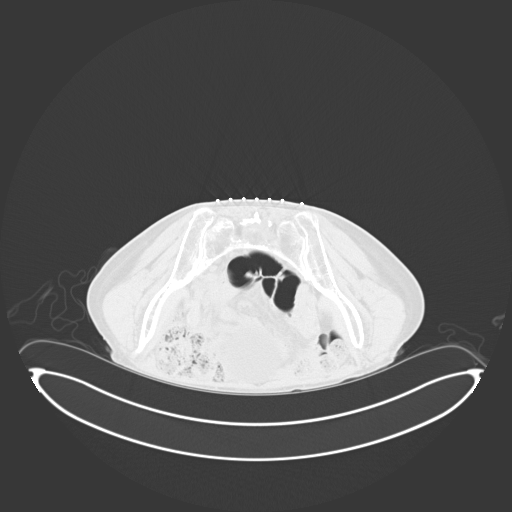
[im 8/25  mediastinal]
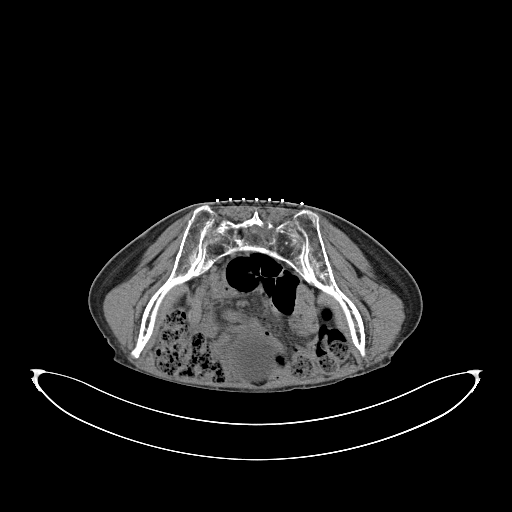
[im 8/25  lung]
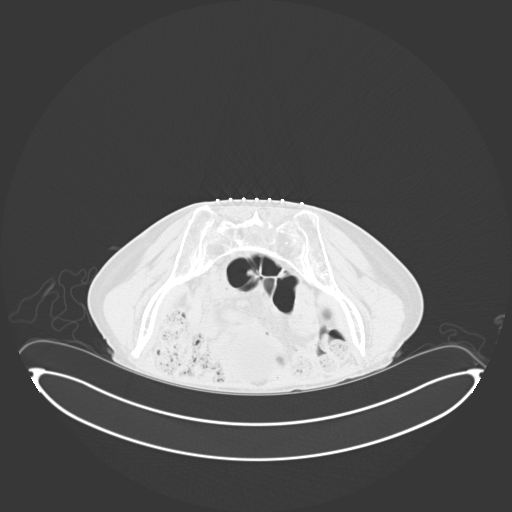
[im 10/25  lung]
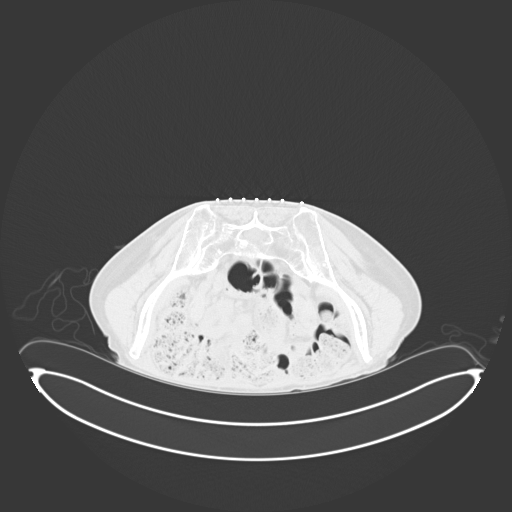
[im 11/25  lung]
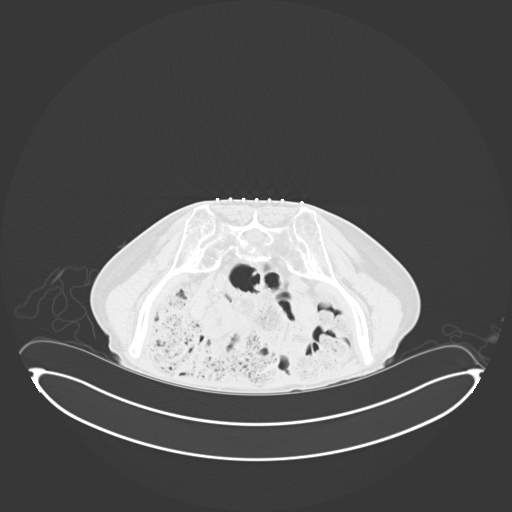
[im 13/25  lung]
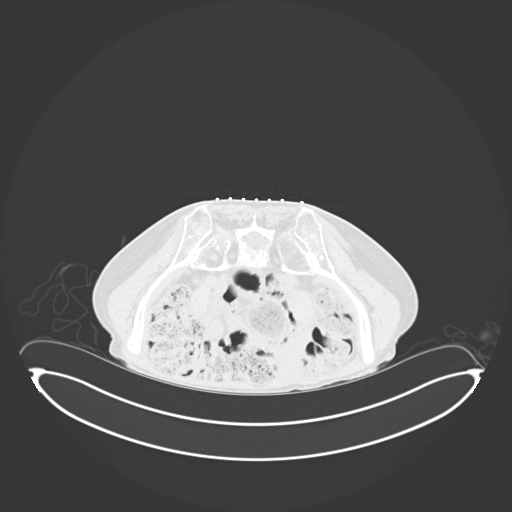
[im 15/25  mediastinal]
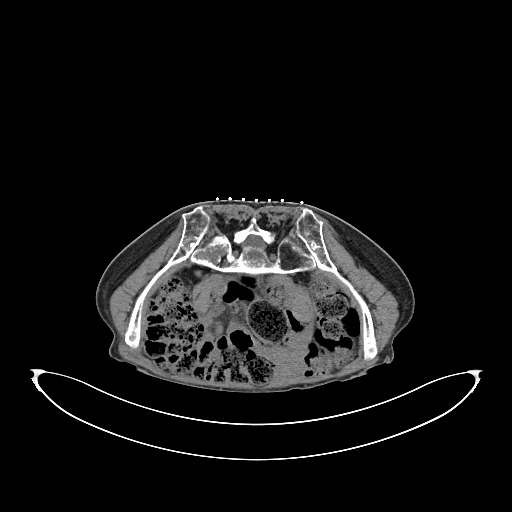
[im 15/25  lung]
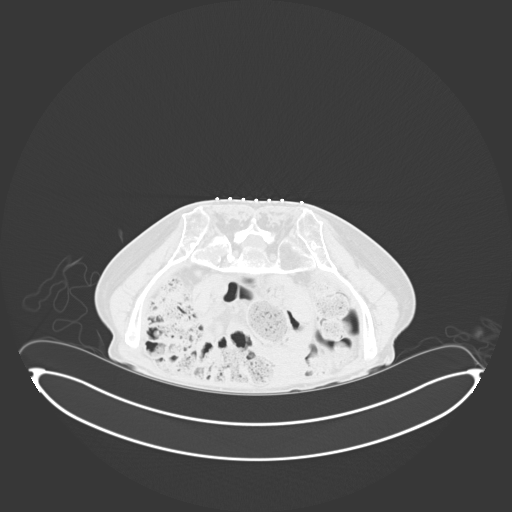
[im 16/25  lung]
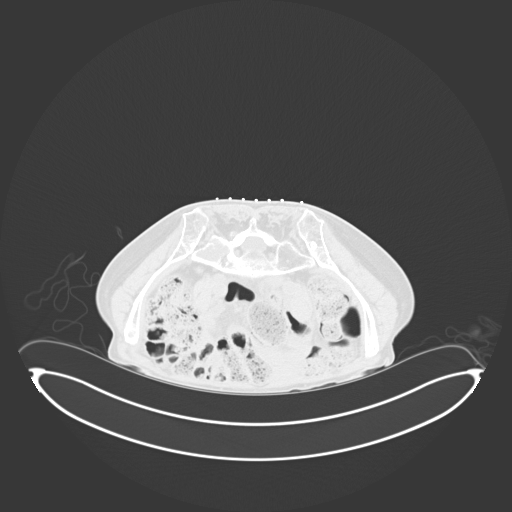
[im 18/25  lung]
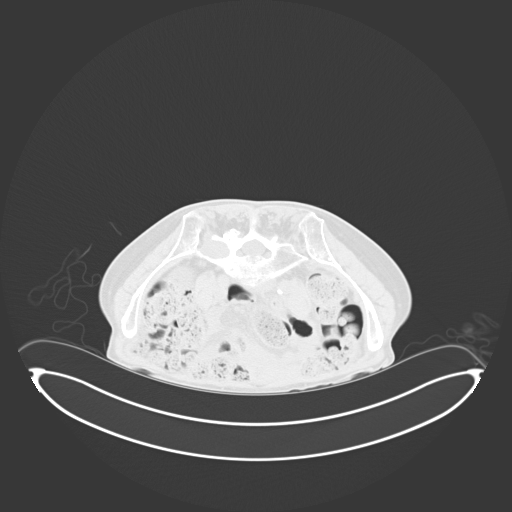
[im 19/25  lung]
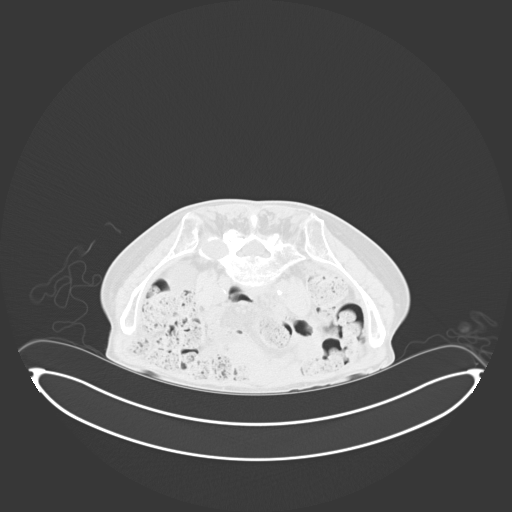
[im 21/25  mediastinal]
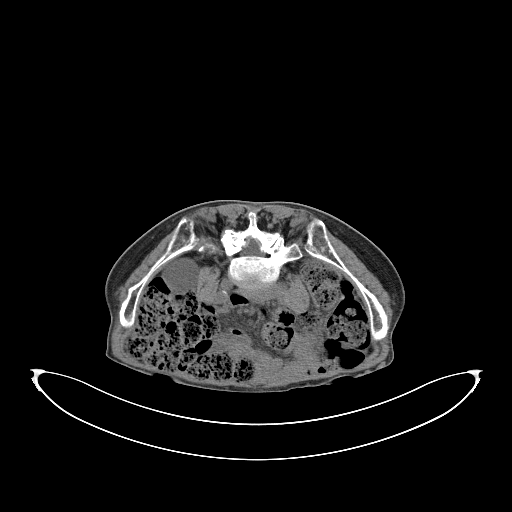
[im 21/25  lung]
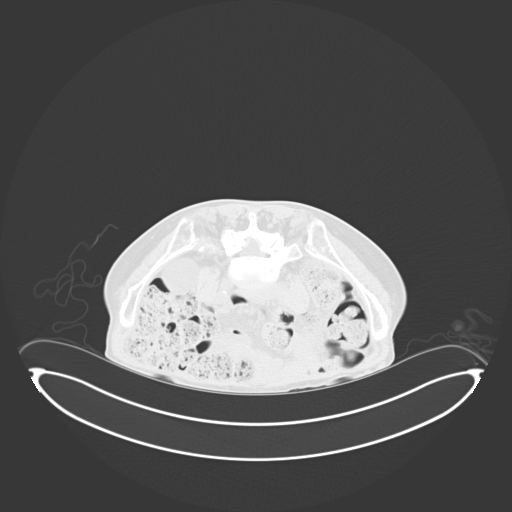
[im 22/25  lung]
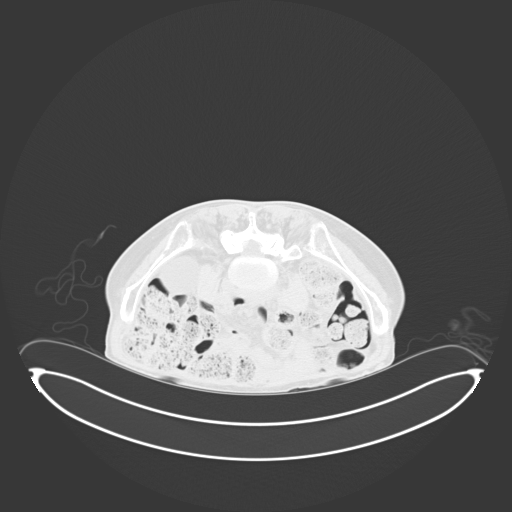
[im 24/25  lung]
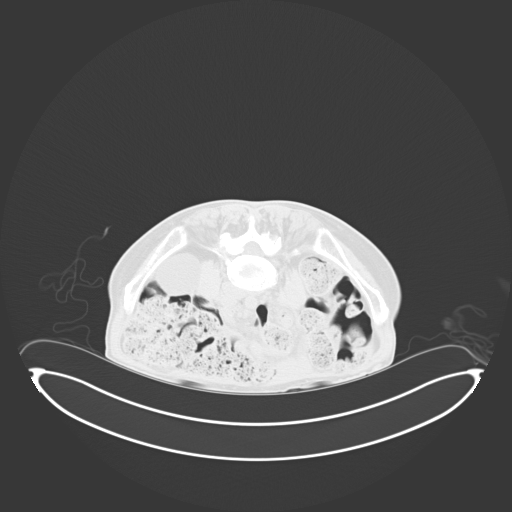

[15 of 28 positions shown; findings below may reference images not displayed]

EXAM:
CT GUIDED BONE MARROW ASPIRATION AND CORE BIOPSY

MEDICATIONS:
None.

ANESTHESIA/SEDATION:
Moderate (conscious) sedation was employed during this procedure. A
total of 2 milligrams versed and 100 micrograms fentanyl were
administered intravenously. The patient's level of consciousness and
vital signs were monitored continuously by radiology nursing
throughout the procedure under my direct supervision.

Total monitored sedation time: 10 minutes

FLUOROSCOPY TIME:  None.

COMPLICATIONS:
None immediate.

Estimated blood loss: <25 mL

PROCEDURE:
Informed written consent was obtained from the patient after a
thorough discussion of the procedural risks, benefits and
alternatives. All questions were addressed. Maximal Sterile Barrier
Technique was utilized including caps, mask, sterile gowns, sterile
gloves, sterile drape, hand hygiene and skin antiseptic. A timeout
was performed prior to the initiation of the procedure.

The patient was positioned prone and non-contrast localization CT
was performed of the pelvis to demonstrate the iliac marrow spaces.

Maximal barrier sterile technique utilized including caps, mask,
sterile gowns, sterile gloves, large sterile drape, hand hygiene,
and betadine prep.

Under sterile conditions and local anesthesia, an 11 gauge coaxial
bone biopsy needle was advanced into the right iliac marrow space.
Needle position was confirmed with CT imaging. Initially, bone
marrow aspiration was performed. Next, the 11 gauge outer cannula
was utilized to obtain a right iliac bone marrow core biopsy. Needle
was removed. Hemostasis was obtained with compression. The patient
tolerated the procedure well. Samples were prepared with the
cytotechnologist.
IMPRESSION: Technically successful CT-guided bone marrow biopsy and aspirate of
the right iliac bone.

## 2020-07-02 ENCOUNTER — Telehealth: Payer: Self-pay

## 2020-07-02 ENCOUNTER — Other Ambulatory Visit: Payer: Self-pay | Admitting: Internal Medicine

## 2020-07-02 DIAGNOSIS — E041 Nontoxic single thyroid nodule: Secondary | ICD-10-CM

## 2020-07-02 NOTE — Telephone Encounter (Signed)
I am not sure why the prev. Orders are not visible... I reordered the 2 Bx'es.

## 2020-07-02 NOTE — Telephone Encounter (Signed)
New message   [facility] calling   Checking on the status of biopsy order.

## 2020-07-03 ENCOUNTER — Other Ambulatory Visit: Payer: Self-pay

## 2020-07-03 ENCOUNTER — Ambulatory Visit (INDEPENDENT_AMBULATORY_CARE_PROVIDER_SITE_OTHER): Payer: Medicare HMO

## 2020-07-03 ENCOUNTER — Other Ambulatory Visit: Payer: Self-pay | Admitting: *Deleted

## 2020-07-03 ENCOUNTER — Other Ambulatory Visit: Payer: Self-pay | Admitting: Hematology

## 2020-07-03 DIAGNOSIS — Z23 Encounter for immunization: Secondary | ICD-10-CM

## 2020-07-03 DIAGNOSIS — C9000 Multiple myeloma not having achieved remission: Secondary | ICD-10-CM

## 2020-07-03 MED ORDER — ACYCLOVIR 400 MG PO TABS: 400.0000 mg | ORAL_TABLET | Freq: Two times a day (BID) | ORAL | 3 refills | Status: DC

## 2020-07-03 MED ORDER — FENTANYL 12 MCG/HR TD PT72
1.0000 | MEDICATED_PATCH | TRANSDERMAL | 0 refills | Status: DC
Start: 2020-07-03 — End: 2020-07-30

## 2020-07-03 MED FILL — FENTANYL 12 MCG/HR PT72: 12 | 30 days supply | Qty: 10 | Fill #0

## 2020-07-03 NOTE — Telephone Encounter (Signed)
Requested refill for acyclovir - requested quantity of #60 since she takes twice a day

## 2020-07-10 ENCOUNTER — Ambulatory Visit
Admission: RE | Admit: 2020-07-10 | Discharge: 2020-07-10 | Disposition: A | Discharge status: Home / Self Care | Payer: Medicare HMO | Source: Ambulatory Visit | Attending: Internal Medicine | Admitting: Internal Medicine

## 2020-07-10 ENCOUNTER — Other Ambulatory Visit (HOSPITAL_COMMUNITY)
Admission: RE | Admit: 2020-07-10 | Discharge: 2020-07-10 | Disposition: A | Discharge status: Home / Self Care | Payer: Medicare HMO | Source: Ambulatory Visit | Attending: Internal Medicine | Admitting: Internal Medicine

## 2020-07-10 DIAGNOSIS — Z8579 Personal history of other malignant neoplasms of lymphoid, hematopoietic and related tissues: Secondary | ICD-10-CM | POA: Diagnosis not present

## 2020-07-10 DIAGNOSIS — E042 Nontoxic multinodular goiter: Secondary | ICD-10-CM | POA: Diagnosis not present

## 2020-07-10 DIAGNOSIS — E041 Nontoxic single thyroid nodule: Secondary | ICD-10-CM | POA: Diagnosis not present

## 2020-07-10 DIAGNOSIS — C9000 Multiple myeloma not having achieved remission: Secondary | ICD-10-CM | POA: Insufficient documentation

## 2020-07-11 ENCOUNTER — Encounter: Payer: Self-pay | Admitting: Internal Medicine

## 2020-07-11 ENCOUNTER — Telehealth: Payer: Self-pay | Admitting: *Deleted

## 2020-07-11 LAB — CYTOLOGY - NON PAP

## 2020-07-11 MED FILL — NINLARO 3 MG CAPS: 3 | 28 days supply | Qty: 3 | Fill #1

## 2020-07-11 NOTE — Addendum Note (Signed)
Addended by: [name] on: 07/11/2020 05:03 PM   Modules accepted: Orders

## 2020-07-11 NOTE — Telephone Encounter (Signed)
Pt called to make aware that she has thyroid cancer and will be having surgery. Pt will be taking Levothyroxine after surgery. Pt wanted advise on taking oral chemo with thyroid med. Per Dr.Kale, pt is to take Ninlaro at least 2 hrs apart from Levothyroxine, due to drug sensitivity and absorption. Pt verbalized understanding.

## 2020-07-25 ENCOUNTER — Ambulatory Visit: Payer: Self-pay | Admitting: Surgery

## 2020-07-25 DIAGNOSIS — C73 Malignant neoplasm of thyroid gland: Secondary | ICD-10-CM | POA: Diagnosis not present

## 2020-07-25 NOTE — Patient Instructions (Addendum)
Health Maintenance Due  Topic Date Due  . TETANUS/TDAP - consider Tdap at pharmacy  02/11/2020  . COLONOSCOPY  - back burner until thyroid is addressed 07/15/2020

## 2020-07-25 NOTE — Progress Notes (Signed)
Phone [phone] In person visit   Subjective:   Megan Olson is a 79 y.o. year old very pleasant female patient who presents for/with See problem oriented charting Chief Complaint  Patient presents with  . Hypertension    brought BP readings with her   . Hyperlipidemia  . Hyperglycemia  . Thyroid Cancer    recently diagnosed, waiting to schedule thyroidectomy - surgeon with [facility]   This visit occurred during the SARS-CoV-2 public health emergency.  Safety protocols were in place, including screening questions prior to the visit, additional usage of staff PPE, and extensive cleaning of exam room while observing appropriate contact time as indicated for disinfecting solutions.   Past Medical History-  Patient Active Problem List   Diagnosis Date Noted  . Left thyroid nodule 06/18/2020    Priority: High  . Multiple myeloma not having achieved remission ([place]) 12/12/2018    Priority: High  . Pain in thoracic spine 11/03/2018    Priority: Medium  . Hyperglycemia 03/29/2015    Priority: Medium  . Hyperlipidemia 10/18/2014    Priority: Medium  . Essential hypertension 03/11/2007    Priority: Medium  . Counseling regarding advance care planning and goals of care 12/12/2018    Priority: Low  . History of adenomatous polyp of colon 07/23/2015    Priority: Low  . Raynaud phenomenon 10/19/2014    Priority: Low  . Syncope 10/19/2014    Priority: Low  . Glaucoma 01/05/2011    Priority: Low  . Osteoporosis 03/11/2007    Priority: Low  . Prediabetes 06/18/2020  . Primary open angle glaucoma (POAG) of right eye, moderate stage 10/11/2017  . Combined forms of age-related cataract of left eye 04/28/2017    Medications- reviewed and updated Current Outpatient Medications  Medication Sig Dispense Refill  . acyclovir (ZOVIRAX) 400 MG tablet Take 1 tablet (400 mg total) by mouth 2 (two) times daily. 60 tablet 3  . ALPHAGAN P 0.1 % SOLN Apply 1 drop topically See admin  instructions. Apply 2 drops in right eye 3 times a day    . amLODipine (NORVASC) 5 MG tablet Take 1 tablet (5 mg total) by mouth daily. 90 tablet 3  . aspirin EC 81 MG tablet Take 81 mg by mouth daily.    . bimatoprost (LUMIGAN) 0.03 % ophthalmic solution Place 1 drop into the right eye at bedtime.     . Cholecalciferol (VITAMIN D3) 1000 UNITS CAPS Take 5,000 Units by mouth daily.     Marland Kitchen co-enzyme Q-10 50 MG capsule Take 50 mg by mouth daily.      . dorzolamide-timolol (COSOPT) 22.3-6.8 MG/ML ophthalmic solution Place 2 drops into both eyes 2 (two) times daily.   11  . famotidine (PEPCID) 10 MG tablet Take 10 mg by mouth daily.     . fentaNYL (DURAGESIC) 12 MCG/HR Place 1 patch onto the skin every 3 (three) days. 10 patch 0  . Magnesium Cl-Calcium Carbonate (SLOW-MAG PO) Take by mouth.    . Multiple Vitamins-Minerals (MULTIVITAMIN WITH MINERALS) tablet Take 1 tablet by mouth daily.      Marland Kitchen NINLARO 3 MG capsule TAKE 1 CAP (3 MG) BY MOUTH WEEKLY, 3 WEEKS ON, 1 WEEK OFF, REPEAT EVERY 4 WEEKS. TAKE ON AN EMPTY STOMACH 1HR BEFORE OR 2HR AFTER MEAL 3 capsule 2  . Omega-3 1000 MG CAPS Take by mouth.    . oxyCODONE-acetaminophen (PERCOCET) 5-325 MG tablet Take 1-2 tablets by mouth every 4 (four) hours as needed for  moderate pain or severe pain. 90 tablet 0  . polyethylene glycol (MIRALAX) packet Take 17 g by mouth daily. 30 each 1  . senna-docusate (SENOKOT-S) 8.6-50 MG tablet Take 2 tablets by mouth at bedtime. 60 tablet 2  . vitamin C (ASCORBIC ACID) 500 MG tablet Take 500 mg by mouth daily.    . hydrOXYzine (ATARAX/VISTARIL) 25 MG tablet Take 1 tablet (25 mg total) by mouth 3 (three) times daily as needed. (Patient not taking: Reported on 07/29/2020) 30 tablet 0  . magnesium hydroxide (MILK OF MAGNESIA) 400 MG/5ML suspension Take 15 mLs by mouth daily as needed for mild constipation or moderate constipation. (Patient not taking: Reported on 07/29/2020) 355 mL 0  . Magnesium Oxide 500 MG TABS Take 1  tablet by mouth daily.   (Patient not taking: Reported on 07/29/2020)     No current facility-administered medications for this visit.     Objective:  BP (!) 174/92   Pulse 70   Temp 98.3 F (36.8 C) (Temporal)   Ht $R'5\' 2"'hn$  (1.575 m)   Wt 109 lb (49.4 kg)   SpO2 96%   BMI 19.94 kg/m  Gen: NAD, resting comfortably CV: RRR no murmurs rubs or gallops Lungs: CTAB no crackles, wheeze, rhonchi Ext: no edema Skin: warm, dry    Assessment and Plan   #Thyroid cancer-diagnosed by Dr. Freda Munro after ultrasound guided biopsy.  Patient is pending evaluation with [facility] surgery for thyroidectomy- working with Dr. Harlow Asa . Pending surgical date. Will be total thyroidectomy and will be on levothryoxine - able to complete 4 mets without chest pain or shortness of breath  # Multiple myeloma- doing well and on maintenance medication. Has really thrived and done well lately. Will have to change timing of meds- will work with oncology on this  # Back pain- compression fractures noted on PET- on fentanyl patch and on oxycodone- three half tablets per day.   # Hyperglycemia/insulin resistance/prediabetes S:  Medication: None Exercise and diet- mowing the lawn, cleaning the house Lab Results  Component Value Date   HGBA1C 5.9 (A) 06/18/2020   HGBA1C 5.8 (H) 01/26/2020   HGBA1C 5.6 10/10/2018   A/P: reasonable control when recently checked by Dr. Cruzita Lederer- continue with medicines  #hyperlipidemia S: Medication:None Lab Results  Component Value Date   CHOL 220 (H) 01/26/2020   HDL 68 01/26/2020   LDLCALC 132 (H) 01/26/2020   TRIG 97 01/26/2020   CHOLHDL 3.2 01/26/2020   A/P: Patient with hyperlipidemia but at her age and with comorbid conditions we have opted not to start cholesterol medicine at this time. She prefers to stay off medication.   #hypertension with whitecoat hypertension S: medication: Amlodipine 5 mg moved to bedtime and has done well with this Home readings #s:  Excellent Home readings once again ranging primarily from the 120s to the 130s-over the last 38 readings only have one reading above 140 at 166 with diastolics remaining controlled BP Readings from Last 3 Encounters:  07/29/20 (!) 174/92  06/20/20 (!) 193/118  06/18/20 (!) 180/96  A/P: excellent control- continue current medicines  Recommended follow up: Return in about 6 months (around 01/27/2021) for physical or sooner if needed. Future Appointments  Date Time Provider Real  08/07/2020  9:00 AM [facility] LAB [facility] None  08/14/2020  9:40 AM [name], MD [facility] None  08/14/2020 10:30 AM [facility] INFUSION [facility] None  06/16/2021  9:00 AM [name], MD [facility] None    Lab/Order associations:  ICD-10-CM   1. Essential hypertension  I10   2. Hyperglycemia  R73.9   3. Hyperlipidemia, unspecified hyperlipidemia type  E78.5   4. Prediabetes  R73.03   5. Multiple myeloma not having achieved remission ([facility])  C90.00   6. Left thyroid nodule  E04.1    Return precautions advised.  Garret Reddish, MD

## 2020-07-29 ENCOUNTER — Telehealth: Payer: Self-pay | Admitting: *Deleted

## 2020-07-29 ENCOUNTER — Encounter: Payer: Self-pay | Admitting: Family Medicine

## 2020-07-29 ENCOUNTER — Ambulatory Visit (INDEPENDENT_AMBULATORY_CARE_PROVIDER_SITE_OTHER): Payer: Medicare HMO | Admitting: Family Medicine

## 2020-07-29 ENCOUNTER — Other Ambulatory Visit: Payer: Self-pay

## 2020-07-29 VITALS — BP 174/92 | HR 70 | Temp 98.3°F | Ht 62.0 in | Wt 109.0 lb

## 2020-07-29 DIAGNOSIS — R7303 Prediabetes: Secondary | ICD-10-CM | POA: Diagnosis not present

## 2020-07-29 DIAGNOSIS — I10 Essential (primary) hypertension: Secondary | ICD-10-CM | POA: Diagnosis not present

## 2020-07-29 DIAGNOSIS — R739 Hyperglycemia, unspecified: Secondary | ICD-10-CM | POA: Diagnosis not present

## 2020-07-29 DIAGNOSIS — E041 Nontoxic single thyroid nodule: Secondary | ICD-10-CM

## 2020-07-29 DIAGNOSIS — E785 Hyperlipidemia, unspecified: Secondary | ICD-10-CM

## 2020-07-29 DIAGNOSIS — C9000 Multiple myeloma not having achieved remission: Secondary | ICD-10-CM | POA: Diagnosis not present

## 2020-07-29 DIAGNOSIS — Z1159 Encounter for screening for other viral diseases: Secondary | ICD-10-CM

## 2020-07-29 NOTE — Telephone Encounter (Signed)
Received call from patient requesting a refill on her oxycodone/APAP 5-325. Last filled on 05/29/20. She has enough until Friday.  Please refill thank you

## 2020-07-30 ENCOUNTER — Other Ambulatory Visit: Payer: Self-pay | Admitting: *Deleted

## 2020-07-30 ENCOUNTER — Other Ambulatory Visit: Payer: Self-pay | Admitting: Hematology

## 2020-07-30 DIAGNOSIS — C9000 Multiple myeloma not having achieved remission: Secondary | ICD-10-CM

## 2020-07-30 MED ORDER — FENTANYL 12 MCG/HR TD PT72
1.0000 | MEDICATED_PATCH | TRANSDERMAL | 0 refills | Status: DC
Start: 2020-07-30 — End: 2020-08-23

## 2020-07-30 NOTE — Telephone Encounter (Signed)
Received request by phone to refill oxycodone

## 2020-07-31 ENCOUNTER — Telehealth: Payer: Self-pay | Admitting: *Deleted

## 2020-07-31 ENCOUNTER — Other Ambulatory Visit (HOSPITAL_COMMUNITY)
Admission: RE | Admit: 2020-07-31 | Discharge: 2020-07-31 | Disposition: A | Discharge status: Home / Self Care | Payer: Medicare HMO | Source: Ambulatory Visit | Attending: Surgery | Admitting: Surgery

## 2020-07-31 DIAGNOSIS — Z20822 Contact with and (suspected) exposure to covid-19: Secondary | ICD-10-CM | POA: Insufficient documentation

## 2020-07-31 DIAGNOSIS — Z01818 Encounter for other preprocedural examination: Secondary | ICD-10-CM | POA: Diagnosis not present

## 2020-07-31 LAB — SARS CORONAVIRUS 2 (TAT 6-24 HRS): SARS Coronavirus 2: NEGATIVE

## 2020-07-31 MED ORDER — OXYCODONE-ACETAMINOPHEN 5-325 MG PO TABS: 1.0000 | ORAL_TABLET | ORAL | 0 refills | Status: DC | PRN

## 2020-07-31 NOTE — Progress Notes (Signed)
DUE TO COVID-19 ONLY ONE VISITOR IS ALLOWED TO COME WITH YOU AND STAY IN THE WAITING ROOM ONLY DURING PRE OP AND PROCEDURE DAY OF SURGERY. THE 1 VISITOR  MAY VISIT WITH YOU AFTER SURGERY IN YOUR PRIVATE ROOM DURING VISITING HOURS ONLY!  YOU NEED TO HAVE A COVID 19 TEST ON___10/28/2021 ____ @_______ , THIS TEST MUST BE DONE BEFORE SURGERY,  COVID TESTING SITE [address] [place] [address], IT IS ON THE RIGHT GOING OUT [address] APPROXIMATELY  2 MINUTES PAST [employer] ON THE RIGHT. ONCE YOUR COVID TEST IS COMPLETED,  PLEASE BEGIN THE QUARANTINE INSTRUCTIONS AS OUTLINED IN YOUR HANDOUT.                Megan Olson  07/31/2020   Your procedure is scheduled on: 08/02/2020    Report to [facility] Main  Entrance   Report to admitting at   0530 AM     Call this number if you have problems the morning of surgery [phone]    REMEMBER: NO  SOLID FOOD CANDY OR GUM AFTER MIDNIGHT. CLEAR LIQUIDS UNTIL  0430am      . NOTHING BY MOUTH EXCEPT CLEAR LIQUIDS UNTIL    . PLEASE FINISH ENSURE DRINK PER SURGEON ORDER  WHICH NEEDS TO BE COMPLETED 0430am.      CLEAR LIQUID DIET   Foods Allowed                                                                    Coffee and tea, regular and decaf                            Fruit ices (not with fruit pulp)                                      Iced Popsicles                                    Carbonated beverages, regular and diet                                    Cranberry, grape and apple juices Sports drinks like Gatorade Lightly seasoned clear broth or consume(fat free) Sugar, honey syrup ___________________________________________________________________      BRUSH YOUR TEETH MORNING OF SURGERY AND RINSE YOUR MOUTH OUT, NO CHEWING GUM CANDY OR MINTS.     Take these medicines the morning of surgery with A SIP OF WATER: eye drops as usual                                  You may not have any metal on your  body including hair pins and              piercings  Do not wear jewelry, make-up, lotions, powders or perfumes, deodorant  Do not wear nail polish on your fingernails.  Do not shave  48 hours prior to surgery.              Men may shave face and neck.   Do not bring valuables to the hospital. [place].  Contacts, dentures or bridgework may not be worn into surgery.  Leave suitcase in the car. After surgery it may be brought to your room.     Patients discharged the day of surgery will not be allowed to drive home. IF YOU ARE HAVING SURGERY AND GOING HOME THE SAME DAY, YOU MUST HAVE AN ADULT TO DRIVE YOU HOME AND BE WITH YOU FOR 24 HOURS. YOU MAY GO HOME BY TAXI OR UBER OR ORTHERWISE, BUT AN ADULT MUST ACCOMPANY YOU HOME AND STAY WITH YOU FOR 24 HOURS.  Name and phone number of your driver:  Special Instructions: N/A              Please read over the following fact sheets you were given: _____________________________________________________________________  [facility] - Preparing for Surgery Before surgery, you can play an important role.  Because skin is not sterile, your skin needs to be as free of germs as possible.  You can reduce the number of germs on your skin by washing with CHG (chlorahexidine gluconate) soap before surgery.  CHG is an antiseptic cleaner which kills germs and bonds with the skin to continue killing germs even after washing. Please DO NOT use if you have an allergy to CHG or antibacterial soaps.  If your skin becomes reddened/irritated stop using the CHG and inform your nurse when you arrive at Short Stay. Do not shave (including legs and underarms) for at least 48 hours prior to the first CHG shower.  You may shave your face/neck. Please follow these instructions carefully:  1.  Shower with CHG Soap the night before surgery and the  morning of Surgery.  2.  If you choose to wash your hair, wash your hair  first as usual with your  normal  shampoo.  3.  After you shampoo, rinse your hair and body thoroughly to remove the  shampoo.                           4.  Use CHG as you would any other liquid soap.  You can apply chg directly  to the skin and wash                       Gently with a scrungie or clean washcloth.  5.  Apply the CHG Soap to your body ONLY FROM THE NECK DOWN.   Do not use on face/ open                           Wound or open sores. Avoid contact with eyes, ears mouth and genitals (private parts).                       Wash face,  Genitals (private parts) with your normal soap.             6.  Wash thoroughly, paying special attention to the area where your surgery  will be performed.  7.  Thoroughly rinse your body with  warm water from the neck down.  8.  DO NOT shower/wash with your normal soap after using and rinsing off  the CHG Soap.                9.  Pat yourself dry with a clean towel.            10.  Wear clean pajamas.            11.  Place clean sheets on your bed the night of your first shower and do not  sleep with pets. Day of Surgery : Do not apply any lotions/deodorants the morning of surgery.  Please wear clean clothes to the hospital/surgery center.  FAILURE TO FOLLOW THESE INSTRUCTIONS MAY RESULT IN THE CANCELLATION OF YOUR SURGERY PATIENT SIGNATURE_________________________________  NURSE SIGNATURE__________________________________  ________________________________________________________________________

## 2020-07-31 NOTE — Telephone Encounter (Signed)
Patient called - requested Dr. Grier Mitts direction on how to restart [facility] following surgery (scheduled for Thyroidectomy on Friday 08/02/20). Patient was to hold Ninlaro prior to surgery, however surgery date was given to patient on Tuesday 10/26 and she had taken Ninlaro on Monday 10/25. MD informed. Per Dr. Irene Limbo, restart 2 weeks after surgery.  Patient verbalized understanding of directions.

## 2020-08-01 ENCOUNTER — Encounter (HOSPITAL_COMMUNITY): Payer: Self-pay

## 2020-08-01 ENCOUNTER — Encounter (HOSPITAL_COMMUNITY)
Admission: RE | Admit: 2020-08-01 | Discharge: 2020-08-01 | Disposition: A | Discharge status: Home / Self Care | Payer: Medicare HMO | Source: Ambulatory Visit | Attending: Surgery | Admitting: Surgery

## 2020-08-01 ENCOUNTER — Ambulatory Visit (HOSPITAL_COMMUNITY)
Admission: RE | Admit: 2020-08-01 | Discharge: 2020-08-01 | Disposition: A | Discharge status: Home / Self Care | Payer: Medicare HMO | Source: Ambulatory Visit | Attending: Anesthesiology | Admitting: Anesthesiology

## 2020-08-01 ENCOUNTER — Encounter (HOSPITAL_COMMUNITY): Payer: Self-pay | Admitting: Surgery

## 2020-08-01 ENCOUNTER — Other Ambulatory Visit: Payer: Self-pay

## 2020-08-01 DIAGNOSIS — Z01810 Encounter for preprocedural cardiovascular examination: Secondary | ICD-10-CM | POA: Diagnosis not present

## 2020-08-01 DIAGNOSIS — C73 Malignant neoplasm of thyroid gland: Secondary | ICD-10-CM | POA: Diagnosis present

## 2020-08-01 DIAGNOSIS — Z01818 Encounter for other preprocedural examination: Secondary | ICD-10-CM | POA: Diagnosis not present

## 2020-08-01 HISTORY — DX: Prediabetes: R73.03

## 2020-08-01 HISTORY — DX: Anemia, unspecified: D64.9

## 2020-08-01 LAB — BASIC METABOLIC PANEL
Anion gap: 8 (ref 5–15)
BUN: 17 mg/dL (ref 8–23)
CO2: 29 mmol/L (ref 22–32)
Calcium: 9.6 mg/dL (ref 8.9–10.3)
Chloride: 105 mmol/L (ref 98–111)
Creatinine, Ser: 0.57 mg/dL (ref 0.44–1.00)
GFR, Estimated: >60 mL/min (ref >60)
Glucose, Bld: 110 mg/dL — ABNORMAL HIGH (ref 70–99)
Potassium: 4.6 mmol/L (ref 3.5–5.1)
Sodium: 142 mmol/L (ref 135–145)

## 2020-08-01 LAB — CBC
HCT: 38.4 % (ref 36.0–46.0)
Hemoglobin: 12.6 g/dL (ref 12.0–15.0)
MCH: 33.5 pg (ref 26.0–34.0)
MCHC: 32.8 g/dL (ref 30.0–36.0)
MCV: 102.1 fL — ABNORMAL HIGH (ref 80.0–100.0)
Platelets: 234 10*3/uL (ref 150–400)
RBC: 3.76 MIL/uL — ABNORMAL LOW (ref 3.87–5.11)
RDW: 14.3 % (ref 11.5–15.5)
WBC: 3.5 10*3/uL — ABNORMAL LOW (ref 4.0–10.5)
nRBC: 0 % (ref 0.0–0.2)

## 2020-08-01 NOTE — Anesthesia Preprocedure Evaluation (Addendum)
Anesthesia Evaluation  Patient identified by MRN, date of birth, ID band Patient awake    Reviewed: Allergy & Precautions, NPO status , Patient's Chart, lab work & pertinent test results  History of Anesthesia Complications Negative for: history of anesthetic complications  Airway Mallampati: II  TM Distance: >3 FB Neck ROM: Full    Dental  (+) Dental Advisory Given, Caps   Pulmonary former smoker,    Pulmonary exam normal        Cardiovascular hypertension, Pt. on medications Normal cardiovascular exam     Neuro/Psych negative neurological ROS  negative psych ROS   GI/Hepatic negative GI ROS, Neg liver ROS,   Endo/Other   Pre-DM Thyroid cancer   Renal/GU negative Renal ROS     Musculoskeletal  Back/joint pain    Abdominal   Peds  Hematology  Multiple myeloma    Anesthesia Other Findings Covid test negative Raynaud's phenomenon   Reproductive/Obstetrics                            Anesthesia Physical Anesthesia Plan  ASA: III  Anesthesia Plan: General   Post-op Pain Management:    Induction: Intravenous  PONV Risk Score and Plan: 3 and Treatment may vary due to age or medical condition, Ondansetron and Propofol infusion  Airway Management Planned: Oral ETT  Additional Equipment: None  Intra-op Plan:   Post-operative Plan: Extubation in OR  Informed Consent: I have reviewed the patients History and Physical, chart, labs and discussed the procedure including the risks, benefits and alternatives for the proposed anesthesia with the patient or authorized representative who has indicated his/her understanding and acceptance.     Dental advisory given  Plan Discussed with: CRNA and Anesthesiologist  Anesthesia Plan Comments:        Anesthesia Quick Evaluation

## 2020-08-01 NOTE — H&P (Signed)
General Surgery [facility]  Megan Olson DOB: 1941-06-30 Married / Language: [name] / Race: White Female   History of Present Illness  The patient is a 79 year old female who presents with thyroid cancer.  CHIEF COMPLAINT: papillary thyroid carcinoma  Patient is referred by Dr. [name] for surgical evaluation and management of newly diagnosed papillary thyroid carcinoma. Patient had been undergoing treatment for multiple myeloma. Incidental finding was made on a PET CT scan in December 1610 of hypermetabolic activity in the left side of the thyroid. Patient underwent ultrasound evaluation on June 24, 2020. This showed 2 nodules in the left thyroid lobe measuring 2.0 and 1.2 cm respectively. Biopsy was recommended. Fine-needle aspiration biopsy was performed on July 10, 2020. Both of these returned as papillary carcinoma, Bethesda category VI. Patient is now referred for consideration for total thyroidectomy for management. Patient has no prior history of head or neck surgery. She has no prior history of thyroid disease. She has never been on thyroid medication. There is a family history of Hashimoto's thyroiditis in the patient's sister. There is no family history of other endocrine neoplasm. Patient presents today accompanied by her husband.   Problem List/Past Medical  PAPILLARY THYROID CARCINOMA (C73)   Past Surgical History  Breast Biopsy  Right. Cataract Surgery  Bilateral. Colon Polyp Removal - Open  Tonsillectomy   Diagnostic Studies History  Colonoscopy  5-10 years ago Mammogram  1-3 years ago Pap Smear  >5 years ago  Allergies  Penicillin G Potassium *PENICILLINS*  Sulfacetamide *CHEMICALS*  Diamox Sequels *DIURETICS*   Medication History Acyclovir (400MG  Tablet, Oral) Active. Ninlaro (3MG  Capsule, Oral) Active. oxyCODONE-Acetaminophen (5-325MG  Tablet, Oral) Active. amLODIPine Besylate (5MG  Tablet,  Oral) Active. Dorzolamide HCl-Timolol Mal (22.3-6.8MG /ML Solution, Ophthalmic) Active. Alphagan P (0.1% Solution, Ophthalmic) Active. Atarax (25MG  Tablet, Oral) Active. Q-10 Co-Enzyme (Oral) Specific strength unknown - Active.  Social History  Alcohol use  Occasional alcohol use. Caffeine use  Coffee. No drug use  Tobacco use  Former smoker.  Family History  Alcohol Abuse  Father. Cancer  Sister. Diabetes Mellitus  Sister. Heart Disease  Mother, Sister. Heart disease in female family member before age 16  Hypertension  Brother, Mother, Sister. Respiratory Condition  Brother. Thyroid problems  Sister.  Pregnancy / Birth History  Age at menarche  74 years. Age of menopause  56-50 Contraceptive History  Oral contraceptives. Gravida  1 Length (months) of breastfeeding  3-6 Maternal age  30-35 Para  1  Other Problems  Back Pain  Cancer  High blood pressure  Other disease, cancer, significant illness  Thyroid Cancer  Thyroid Disease  Transfusion history   Review of Systems General Present- Weight Loss. Not Present- Appetite Loss, Chills, Fatigue, Fever, Night Sweats and Weight Gain. Skin Present- Change in Wart/Mole and Dryness. Not Present- Hives, Jaundice, New Lesions, Non-Healing Wounds, Rash and Ulcer. HEENT Present- Wears glasses/contact lenses. Not Present- Earache, Hearing Loss, Hoarseness, Nose Bleed, Oral Ulcers, Ringing in the Ears, Seasonal Allergies, Sinus Pain, Sore Throat, Visual Disturbances and Yellow Eyes. Cardiovascular Present- Leg Cramps. Not Present- Chest Pain, Difficulty Breathing Lying Down, Palpitations, Rapid Heart Rate, Shortness of Breath and Swelling of Extremities. Gastrointestinal Not Present- Abdominal Pain, Bloating, Bloody Stool, Change in Bowel Habits, Chronic diarrhea, Constipation, Difficulty Swallowing, Excessive gas, Gets full quickly at meals, Hemorrhoids, Indigestion, Nausea, Rectal Pain and  Vomiting. Female Genitourinary Not Present- Frequency, Nocturia, Painful Urination, Pelvic Pain and Urgency. Musculoskeletal Present- Back Pain. Not Present- Joint Pain, Joint  Stiffness, Muscle Pain, Muscle Weakness and Swelling of Extremities. Neurological Present- Tingling. Not Present- Decreased Memory, Fainting, Headaches, Numbness, Seizures, Tremor, Trouble walking and Weakness. Psychiatric Not Present- Anxiety, Bipolar, Change in Sleep Pattern, Depression, Fearful and Frequent crying. Endocrine Present- Cold Intolerance. Not Present- Excessive Hunger, Hair Changes, Heat Intolerance, Hot flashes and New Diabetes. Hematology Not Present- Blood Thinners, Easy Bruising, Excessive bleeding, Gland problems, HIV and Persistent Infections.  Vitals  Weight: 110.38 lb Height: 62in Body Surface Area: 1.48 m Body Mass Index: 20.19 kg/m  Temp.: 98.45F  Pulse: 87 (Regular)  P.OX: 96% (Room air) BP: 154/92(Sitting, Left Arm, Standard)  Physical Exam    GENERAL APPEARANCE Development: normal Nutritional status: normal Gross deformities: none  SKIN Rash, lesions, ulcers: none Induration, erythema: none Nodules: none palpable  EYES Conjunctiva and lids: normal Pupils: equal and reactive Iris: normal bilaterally  EARS, NOSE, MOUTH, THROAT External ears: no lesion or deformity External nose: no lesion or deformity Hearing: grossly normal Due to Covid-19 pandemic, patient is wearing a mask.  NECK Symmetric: yes Trachea: midline Thyroid: Palpation demonstrates a firm hard 1.5 cm nodule in the inferior left thyroid lobe. This is discrete and mobile with swallowing. There is an area of ecchymosis at the sternal notch related to the patient's recent fine-needle aspiration biopsy. Palpation of the right thyroid lobe shows no significant or dominant nodules. No tenderness.  CHEST Respiratory effort: normal Retraction or accessory muscle use: no Breath sounds: normal  bilaterally Rales, rhonchi, wheeze: none  CARDIOVASCULAR Auscultation: regular rhythm, normal rate Murmurs: none Pulses: radial pulse 2+ palpable Lower extremity edema: none  MUSCULOSKELETAL Station and gait: normal Digits and nails: no clubbing or cyanosis Muscle strength: grossly normal all extremities Range of motion: grossly normal all extremities Deformity: none  LYMPHATIC Cervical: none palpable Supraclavicular: none palpable  PSYCHIATRIC Oriented to person, place, and time: yes Mood and affect: normal for situation Judgment and insight: appropriate for situation    Assessment & Plan  PAPILLARY THYROID CARCINOMA (C73)  Patient is referred by her endocrinologist for surgical evaluation and management of newly diagnosed papillary thyroid carcinoma.  Patient provided with a copy of "The Thyroid Book: Medical and Surgical Treatment of Thyroid Problems", published by [employer], 16 pages. Book reviewed and explained to patient during visit today.  We reviewed her ultrasound report as well as her fine-needle aspiration biopsy results. We discussed options for management. Total thyroidectomy would be the procedure of choice given the fact that the tumor is multifocal. We discussed the risk and benefits of this procedure including the risk of injury to recurrent laryngeal nerves and parathyroid glands. We discussed the location and size of the surgical incision. We discussed the hospital stay to be anticipated. We discussed her postoperative recovery and return to normal activity. We discussed the need for postoperative radioactive iodine administration. We discussed the need for lifelong thyroid hormone replacement. The patient understands and wishes to proceed with surgery in the near future.  The risks and benefits of the procedure have been discussed at length with the patient. The patient understands the proposed procedure, potential alternative treatments, and the  course of recovery to be expected. All of the patient's questions have been answered at this time. The patient wishes to proceed with surgery.  [name], MD [facility] Office: [phone]

## 2020-08-01 NOTE — Progress Notes (Signed)
Anesthesia Review:  PCP: Melburn Popper , PCP  07/29/2020 LOV  Cardiologist : none  Chest x-ray :08/01/2020- not final  EKG : 08/01/2020- not final  Echo : Stress test: Cardiac Cath :  Activity level: can do a flight of stairs without difficulty  Sleep Study/ CPAP : none Fasting Blood Sugar :      / Checks Blood Sugar -- times a day:   Blood Thinner/ Instructions /Last Dose: ASA / Instructions/ Last Dose :  Last dose of ASA 81 mg on 07/30/2020 per pt  CBC and BMP done 08/01/20- epic

## 2020-08-02 ENCOUNTER — Ambulatory Visit (HOSPITAL_COMMUNITY): Payer: Medicare HMO | Admitting: Anesthesiology

## 2020-08-02 ENCOUNTER — Ambulatory Visit (HOSPITAL_COMMUNITY)
Admission: RE | Admit: 2020-08-02 | Discharge: 2020-08-03 | Disposition: A | Discharge status: Home / Self Care | Payer: Medicare HMO | Attending: Surgery | Admitting: Surgery

## 2020-08-02 ENCOUNTER — Encounter (HOSPITAL_COMMUNITY)
Admission: RE | Disposition: A | Discharge status: Home / Self Care | Payer: Self-pay | Source: Home / Self Care | Attending: Surgery

## 2020-08-02 ENCOUNTER — Encounter (HOSPITAL_COMMUNITY): Payer: Self-pay | Admitting: Surgery

## 2020-08-02 DIAGNOSIS — C73 Malignant neoplasm of thyroid gland: Secondary | ICD-10-CM | POA: Diagnosis not present

## 2020-08-02 DIAGNOSIS — I10 Essential (primary) hypertension: Secondary | ICD-10-CM | POA: Diagnosis not present

## 2020-08-02 DIAGNOSIS — E0789 Other specified disorders of thyroid: Secondary | ICD-10-CM | POA: Diagnosis not present

## 2020-08-02 DIAGNOSIS — E785 Hyperlipidemia, unspecified: Secondary | ICD-10-CM | POA: Diagnosis not present

## 2020-08-02 DIAGNOSIS — Z87891 Personal history of nicotine dependence: Secondary | ICD-10-CM | POA: Diagnosis not present

## 2020-08-02 DIAGNOSIS — M810 Age-related osteoporosis without current pathological fracture: Secondary | ICD-10-CM | POA: Diagnosis not present

## 2020-08-02 HISTORY — PX: THYROIDECTOMY: SHX17

## 2020-08-02 SURGERY — THYROIDECTOMY
Anesthesia: General | Site: Neck

## 2020-08-02 MED ORDER — MAGNESIUM CHLORIDE 64 MG PO TBEC
1.0000 | DELAYED_RELEASE_TABLET | Freq: Every day | ORAL | Status: DC
  Administered 2020-08-02: 64 mg via ORAL
  Filled 2020-08-02 (×2): qty 1

## 2020-08-02 MED ORDER — FENTANYL CITRATE (PF) 100 MCG/2ML IJ SOLN
INTRAMUSCULAR | Status: DC | PRN
Start: 2020-08-02 — End: 2020-08-02
  Administered 2020-08-02 (×2): 50 ug via INTRAVENOUS

## 2020-08-02 MED ORDER — DEXAMETHASONE SODIUM PHOSPHATE 10 MG/ML IJ SOLN
INTRAMUSCULAR | Status: DC | PRN
  Administered 2020-08-02: 10 mg via INTRAVENOUS

## 2020-08-02 MED ORDER — CHLORHEXIDINE GLUCONATE CLOTH 2 % EX PADS: 6.0000 | MEDICATED_PAD | Freq: Once | CUTANEOUS | Status: DC

## 2020-08-02 MED ORDER — SENNOSIDES-DOCUSATE SODIUM 8.6-50 MG PO TABS
1.0000 | ORAL_TABLET | Freq: Every day | ORAL | Status: DC
  Administered 2020-08-02: 1 via ORAL
  Filled 2020-08-02: qty 1

## 2020-08-02 MED ORDER — OXYCODONE HCL 5 MG PO TABS: 5.0000 mg | ORAL_TABLET | ORAL | Status: DC | PRN

## 2020-08-02 MED ORDER — OXYCODONE-ACETAMINOPHEN 5-325 MG PO TABS: 1.0000 | ORAL_TABLET | ORAL | Status: DC | PRN

## 2020-08-02 MED ORDER — ONDANSETRON 4 MG PO TBDP: 4.0000 mg | ORAL_TABLET | Freq: Four times a day (QID) | ORAL | Status: DC | PRN

## 2020-08-02 MED ORDER — PROPOFOL 500 MG/50ML IV EMUL
INTRAVENOUS | Status: DC | PRN
  Administered 2020-08-02: 25 ug/kg/min via INTRAVENOUS

## 2020-08-02 MED ORDER — PROPOFOL 10 MG/ML IV BOLUS
INTRAVENOUS | Status: DC | PRN
  Administered 2020-08-02: 140 mg via INTRAVENOUS

## 2020-08-02 MED ORDER — LACTATED RINGERS IV SOLN: INTRAVENOUS | Status: DC

## 2020-08-02 MED ORDER — FENTANYL 12 MCG/HR TD PT72: 1.0000 | MEDICATED_PATCH | TRANSDERMAL | Status: DC

## 2020-08-02 MED ORDER — OXYCODONE HCL 5 MG PO TABS: 5.0000 mg | ORAL_TABLET | Freq: Once | ORAL | Status: DC | PRN

## 2020-08-02 MED ORDER — DEXAMETHASONE SODIUM PHOSPHATE 10 MG/ML IJ SOLN
INTRAMUSCULAR | Status: AC
  Filled 2020-08-02: qty 1

## 2020-08-02 MED ORDER — SODIUM CHLORIDE 0.9 % IR SOLN
Status: DC | PRN
  Administered 2020-08-02: 1000 mL

## 2020-08-02 MED ORDER — OXYCODONE HCL 5 MG PO TABS
5.0000 mg | ORAL_TABLET | Freq: Four times a day (QID) | ORAL | 0 refills | Status: DC | PRN
Start: 2020-08-02 — End: 2020-09-26

## 2020-08-02 MED ORDER — CHLORHEXIDINE GLUCONATE 0.12 % MT SOLN
15.0000 mL | Freq: Once | OROMUCOSAL | Status: AC
  Administered 2020-08-02: 15 mL via OROMUCOSAL

## 2020-08-02 MED ORDER — SIMETHICONE 80 MG PO CHEW: 80.0000 mg | CHEWABLE_TABLET | Freq: Every evening | ORAL | Status: DC

## 2020-08-02 MED ORDER — ALBUMIN HUMAN 5 % IV SOLN: INTRAVENOUS | Status: DC | PRN

## 2020-08-02 MED ORDER — OXYCODONE HCL 5 MG/5ML PO SOLN: 5.0000 mg | Freq: Once | ORAL | Status: DC | PRN

## 2020-08-02 MED ORDER — FENTANYL CITRATE (PF) 100 MCG/2ML IJ SOLN
INTRAMUSCULAR | Status: AC
  Filled 2020-08-02: qty 2

## 2020-08-02 MED ORDER — CEFAZOLIN SODIUM-DEXTROSE 2-4 GM/100ML-% IV SOLN
2.0000 g | INTRAVENOUS | Status: AC
  Administered 2020-08-02: 2 g via INTRAVENOUS

## 2020-08-02 MED ORDER — LATANOPROST 0.005 % OP SOLN
1.0000 [drp] | Freq: Every day | OPHTHALMIC | Status: DC
  Filled 2020-08-02: qty 2.5

## 2020-08-02 MED ORDER — HYDROMORPHONE HCL 1 MG/ML IJ SOLN: 1.0000 mg | INTRAMUSCULAR | Status: DC | PRN

## 2020-08-02 MED ORDER — ROCURONIUM BROMIDE 10 MG/ML (PF) SYRINGE
PREFILLED_SYRINGE | INTRAVENOUS | Status: DC | PRN
  Administered 2020-08-02: 10 mg via INTRAVENOUS
  Administered 2020-08-02: 40 mg via INTRAVENOUS

## 2020-08-02 MED ORDER — AMLODIPINE BESYLATE 5 MG PO TABS
5.0000 mg | ORAL_TABLET | Freq: Every day | ORAL | Status: DC
  Administered 2020-08-02: 5 mg via ORAL
  Filled 2020-08-02: qty 1

## 2020-08-02 MED ORDER — ONDANSETRON HCL 4 MG/2ML IJ SOLN
INTRAMUSCULAR | Status: DC | PRN
  Administered 2020-08-02: 4 mg via INTRAVENOUS

## 2020-08-02 MED ORDER — ACETAMINOPHEN 500 MG PO TABS
ORAL_TABLET | ORAL | Status: AC
  Filled 2020-08-02: qty 2

## 2020-08-02 MED ORDER — LIDOCAINE 2% (20 MG/ML) 5 ML SYRINGE
INTRAMUSCULAR | Status: AC
  Filled 2020-08-02: qty 5

## 2020-08-02 MED ORDER — SODIUM CHLORIDE 0.45 % IV SOLN: INTRAVENOUS | Status: DC

## 2020-08-02 MED ORDER — ONDANSETRON HCL 4 MG/2ML IJ SOLN: 4.0000 mg | Freq: Once | INTRAMUSCULAR | Status: DC | PRN

## 2020-08-02 MED ORDER — ROCURONIUM BROMIDE 10 MG/ML (PF) SYRINGE
PREFILLED_SYRINGE | INTRAVENOUS | Status: AC
  Filled 2020-08-02: qty 10

## 2020-08-02 MED ORDER — ACETAMINOPHEN 325 MG PO TABS: 650.0000 mg | ORAL_TABLET | Freq: Four times a day (QID) | ORAL | Status: DC | PRN

## 2020-08-02 MED ORDER — CEFAZOLIN SODIUM-DEXTROSE 2-4 GM/100ML-% IV SOLN
INTRAVENOUS | Status: AC
  Filled 2020-08-02: qty 100

## 2020-08-02 MED ORDER — ONDANSETRON HCL 4 MG/2ML IJ SOLN
INTRAMUSCULAR | Status: AC
  Filled 2020-08-02: qty 2

## 2020-08-02 MED ORDER — POLYETHYLENE GLYCOL 3350 17 G PO PACK
17.0000 g | PACK | Freq: Every day | ORAL | Status: DC
  Administered 2020-08-02: 17 g via ORAL
  Filled 2020-08-02: qty 1

## 2020-08-02 MED ORDER — TRAMADOL HCL 50 MG PO TABS: 50.0000 mg | ORAL_TABLET | Freq: Four times a day (QID) | ORAL | Status: DC | PRN

## 2020-08-02 MED ORDER — CALCIUM CARBONATE ANTACID 500 MG PO CHEW: 2.0000 | CHEWABLE_TABLET | Freq: Two times a day (BID) | ORAL | 1 refills | Status: DC

## 2020-08-02 MED ORDER — FENTANYL CITRATE (PF) 100 MCG/2ML IJ SOLN
25.0000 ug | INTRAMUSCULAR | Status: DC | PRN
  Administered 2020-08-02: 25 ug via INTRAVENOUS

## 2020-08-02 MED ORDER — SUGAMMADEX SODIUM 200 MG/2ML IV SOLN
INTRAVENOUS | Status: DC | PRN
  Administered 2020-08-02: 125 mg via INTRAVENOUS

## 2020-08-02 MED ORDER — ORAL CARE MOUTH RINSE: 15.0000 mL | Freq: Once | OROMUCOSAL | Status: AC

## 2020-08-02 MED ORDER — PROPOFOL 10 MG/ML IV BOLUS
INTRAVENOUS | Status: AC
  Filled 2020-08-02: qty 20

## 2020-08-02 MED ORDER — BRIMONIDINE TARTRATE 0.15 % OP SOLN
1.0000 [drp] | Freq: Three times a day (TID) | OPHTHALMIC | Status: DC
  Administered 2020-08-02: 1 [drp] via OPHTHALMIC
  Filled 2020-08-02: qty 5

## 2020-08-02 MED ORDER — DORZOLAMIDE HCL-TIMOLOL MAL 2-0.5 % OP SOLN
1.0000 [drp] | Freq: Two times a day (BID) | OPHTHALMIC | Status: DC
  Administered 2020-08-02: 1 [drp] via OPHTHALMIC
  Filled 2020-08-02: qty 10

## 2020-08-02 MED ORDER — ACETAMINOPHEN 650 MG RE SUPP: 650.0000 mg | Freq: Four times a day (QID) | RECTAL | Status: DC | PRN

## 2020-08-02 MED ORDER — LIDOCAINE 2% (20 MG/ML) 5 ML SYRINGE
INTRAMUSCULAR | Status: DC | PRN
  Administered 2020-08-02: 60 mg via INTRAVENOUS

## 2020-08-02 MED ORDER — LEVOTHYROXINE SODIUM 88 MCG PO TABS
88.0000 ug | ORAL_TABLET | Freq: Every day | ORAL | 3 refills | Status: DC
Start: 2020-08-02 — End: 2020-09-13

## 2020-08-02 MED ORDER — EPHEDRINE SULFATE-NACL 50-0.9 MG/10ML-% IV SOSY
PREFILLED_SYRINGE | INTRAVENOUS | Status: DC | PRN
  Administered 2020-08-02 (×2): 10 mg via INTRAVENOUS

## 2020-08-02 MED ORDER — CALCIUM CARBONATE 1250 (500 CA) MG PO TABS
2.0000 | ORAL_TABLET | Freq: Three times a day (TID) | ORAL | Status: DC
  Administered 2020-08-02 – 2020-08-03 (×3): 1000 mg via ORAL
  Filled 2020-08-02 (×4): qty 1

## 2020-08-02 MED ORDER — ONDANSETRON HCL 4 MG/2ML IJ SOLN: 4.0000 mg | Freq: Four times a day (QID) | INTRAMUSCULAR | Status: DC | PRN

## 2020-08-02 MED ORDER — ACETAMINOPHEN 500 MG PO TABS
1000.0000 mg | ORAL_TABLET | Freq: Once | ORAL | Status: AC
  Administered 2020-08-02: 1000 mg via ORAL

## 2020-08-02 SURGICAL SUPPLY — 30 items
ADH SKN CLS APL DERMABOND .7 (GAUZE/BANDAGES/DRESSINGS) ×1
APL PRP STRL LF DISP 70% ISPRP (MISCELLANEOUS) ×1
ATTRACTOMAT 16X20 MAGNETIC DRP (DRAPES) ×2 IMPLANT
BLADE SURG 15 STRL LF DISP TIS (BLADE) ×1 IMPLANT
BLADE SURG 15 STRL SS (BLADE) ×2
CHLORAPREP W/TINT 26 (MISCELLANEOUS) ×2 IMPLANT
CLIP VESOCCLUDE MED 6/CT (CLIP) ×6 IMPLANT
CLIP VESOCCLUDE SM WIDE 6/CT (CLIP) ×6 IMPLANT
COVER SURGICAL LIGHT HANDLE (MISCELLANEOUS) ×2 IMPLANT
COVER WAND RF STERILE (DRAPES) ×2 IMPLANT
DERMABOND ADVANCED (GAUZE/BANDAGES/DRESSINGS) ×1
DERMABOND ADVANCED .7 DNX12 (GAUZE/BANDAGES/DRESSINGS) ×1 IMPLANT
DRAPE LAPAROTOMY T 98X78 PEDS (DRAPES) ×2 IMPLANT
ELECT PENCIL ROCKER SW 15FT (MISCELLANEOUS) ×2 IMPLANT
ELECT REM PT RETURN 15FT ADLT (MISCELLANEOUS) ×2 IMPLANT
GAUZE 4X4 16PLY RFD (DISPOSABLE) ×2 IMPLANT
GLOVE SURG ORTHO 8.0 STRL STRW (GLOVE) ×2 IMPLANT
GOWN STRL REUS W/TWL XL LVL3 (GOWN DISPOSABLE) ×4 IMPLANT
HEMOSTAT SURGICEL 2X4 FIBR (HEMOSTASIS) ×2 IMPLANT
ILLUMINATOR WAVEGUIDE N/F (MISCELLANEOUS) ×2 IMPLANT
KIT BASIN OR (CUSTOM PROCEDURE TRAY) ×2 IMPLANT
KIT TURNOVER KIT A (KITS) IMPLANT
PACK BASIC VI WITH GOWN DISP (CUSTOM PROCEDURE TRAY) ×2 IMPLANT
SHEARS HARMONIC 9CM CVD (BLADE) ×2 IMPLANT
SUT MNCRL AB 4-0 PS2 18 (SUTURE) ×2 IMPLANT
SUT VIC AB 3-0 SH 18 (SUTURE) ×4 IMPLANT
SYR BULB IRRIG 60ML STRL (SYRINGE) ×2 IMPLANT
TOWEL OR 17X26 10 PK STRL BLUE (TOWEL DISPOSABLE) ×2 IMPLANT
TOWEL OR NON WOVEN STRL DISP B (DISPOSABLE) ×2 IMPLANT
TUBING CONNECTING 10 (TUBING) ×2 IMPLANT

## 2020-08-02 NOTE — Transfer of Care (Signed)
Immediate Anesthesia Transfer of Care Note  Patient: FRENCHIE PRIBYL  Procedure(s) Performed: TOTAL THYROIDECTOMY (N/A Neck)  Patient Location: PACU  Anesthesia Type:General  Level of Consciousness: awake, alert  and oriented  Airway & Oxygen Therapy: Patient Spontanous Breathing and Patient connected to face mask oxygen  Post-op Assessment: Report given to RN and Post -op Vital signs reviewed and stable  Post vital signs: Reviewed and stable  Last Vitals:  Vitals Value Taken Time  BP 179/92 08/02/20 0915  Temp    Pulse 63 08/02/20 0917  Resp 12 08/02/20 0917  SpO2 100 % 08/02/20 0917  Vitals shown include unvalidated device data.  Last Pain:  Vitals:   08/02/20 0552  TempSrc: Oral      Patients Stated Pain Goal: 4 (88/64/84 7207)  Complications: No complications documented.

## 2020-08-02 NOTE — Anesthesia Postprocedure Evaluation (Signed)
Anesthesia Post Note  Patient: Megan Olson  Procedure(s) Performed: TOTAL THYROIDECTOMY (N/A Neck)     Patient location during evaluation: PACU Anesthesia Type: General Level of consciousness: awake and alert Pain management: pain level controlled Vital Signs Assessment: post-procedure vital signs reviewed and stable Respiratory status: spontaneous breathing, nonlabored ventilation, respiratory function stable and patient connected to nasal cannula oxygen Cardiovascular status: blood pressure returned to baseline and stable Postop Assessment: no apparent nausea or vomiting Anesthetic complications: no   No complications documented.  Last Vitals:  Vitals:   08/02/20 1015 08/02/20 1030  BP: (!) 173/94 (!) 174/88  Pulse: 74 60  Resp: 14 10  Temp:  36.6 C  SpO2: 100% 100%    Last Pain:  Vitals:   08/02/20 1015  TempSrc:   PainSc: [name]

## 2020-08-02 NOTE — Discharge Instructions (Addendum)
[facility]  THYROID & PARATHYROID SURGERY:  POST-OP INSTRUCTIONS  Always review your discharge instruction sheet from the facility where your surgery was performed.  A prescription for pain medication may be given to you upon discharge.  Take your pain medication as prescribed.  If narcotic pain medicine is not needed, then you may take acetaminophen (Tylenol) or ibuprofen (Advil) as needed.  Take your usually prescribed medications unless otherwise directed.  If you need a refill on your pain medication, please contact our office during regular business hours.  Prescriptions cannot be processed by our office after 5 pm or on weekends.  Start with a light diet upon arrival home, such as soup and crackers or toast.  Be sure to drink plenty of fluids daily.  Resume your normal diet the day after surgery.  Most patients will experience some swelling and bruising on the chest and neck area.  Ice packs will help.  Swelling and bruising can take several days to resolve.   It is common to experience some constipation after surgery.  Increasing fluid intake and taking a stool softener (Colace) will usually help or prevent this problem.  A mild laxative (Milk of Magnesia or Miralax) should be taken according to package directions if there has been no bowel movement after 48 hours.  You have steri-strips and a gauze dressing over your incision.  You may remove the gauze bandage on the second day after surgery, and you may shower at that time.  Leave your steri-strips (small skin tapes) in place directly over the incision.  These strips should remain on the skin for 5-7 days and then be removed.  You may get them wet in the shower and pat them dry.  You may resume regular (light) daily activities beginning the next day (such as daily self-care, walking, climbing stairs) gradually increasing activities as tolerated.  You may have sexual intercourse when it is comfortable.  Refrain from  any heavy lifting or straining until approved by your doctor.  You may drive when you no longer are taking prescription pain medication, you can comfortably wear a seatbelt, and you can safely maneuver your car and apply brakes.  You should see your doctor in the office for a follow-up appointment approximately three weeks after your surgery.  Make sure that you call for this appointment within a day or two after you arrive home to insure a convenient appointment time.  WHEN TO CALL YOUR DOCTOR: -- Fever greater than 101.5 -- Inability to urinate -- Nausea and/or vomiting - persistent -- Extreme swelling or bruising -- Continued bleeding from incision -- Increased pain, redness, or drainage from the incision -- Difficulty swallowing or breathing -- Muscle cramping or spasms -- Numbness or tingling in hands or around lips  The clinic staff is available to answer your questions during regular business hours.  Please don't hesitate to call and ask to speak to one of the nurses if you have concerns. You have an appointment schedule with Dr. Harlow Asa on November 22 at 2:00pm.  [name], MD [facility] Office: [phone]

## 2020-08-02 NOTE — Anesthesia Procedure Notes (Signed)
Procedure Name: Intubation Date/Time: 08/02/2020 7:40 AM Performed by: Hayzlee Mcsorley D, CRNA Pre-anesthesia Checklist: Patient identified, Emergency Drugs available, Suction available and Patient being monitored Patient Re-evaluated:Patient Re-evaluated prior to induction Oxygen Delivery Method: Circle system utilized Preoxygenation: Pre-oxygenation with 100% oxygen Induction Type: IV induction Ventilation: Mask ventilation without difficulty Laryngoscope Size: Mac and 3 Grade View: Grade I Tube type: Oral Tube size: 7.0 mm Number of attempts: 1 Airway Equipment and Method: Stylet Placement Confirmation: ETT inserted through vocal cords under direct vision,  positive ETCO2 and breath sounds checked- equal and bilateral Secured at: 21 cm Tube secured with: Tape Dental Injury: Teeth and Oropharynx as per pre-operative assessment

## 2020-08-02 NOTE — Op Note (Signed)
Procedure Note  Pre-operative Diagnosis:  Papillary thyroid carcinoma  Post-operative Diagnosis:  same  Surgeon:  [name], MD  Assistant:  Mohammed Kindle, RN    Procedure:  Total thyroidectomy  Anesthesia:  General  Estimated Blood Loss:  minimal  Drains: none         Specimen: thyroid to pathology  Indications:  Patient is referred by Dr. [name] for surgical evaluation and management of newly diagnosed papillary thyroid carcinoma. Patient had been undergoing treatment for multiple myeloma. Incidental finding was made on a PET CT scan in December 5093 of hypermetabolic activity in the left side of the thyroid. Patient underwent ultrasound evaluation on June 24, 2020. This showed 2 nodules in the left thyroid lobe measuring 2.0 and 1.2 cm respectively. Biopsy was recommended. Fine-needle aspiration biopsy was performed on July 10, 2020. Both of these returned as papillary carcinoma, Bethesda category VI. Patient now comes to surgery for total thyroidectomy.   Procedure Details: Procedure was done in OR #3 at the [facility]. The patient was brought to the operating room and placed in a supine position on the operating room table. Following administration of general anesthesia, the patient was positioned and then prepped and draped in the usual aseptic fashion. After ascertaining that an adequate level of anesthesia had been achieved, a small Kocher incision was made with #15 blade. Dissection was carried through subcutaneous tissues and platysma.Hemostasis was achieved with the electrocautery. Skin flaps were elevated cephalad and caudad from the thyroid notch to the sternal notch. A [place] self-retaining retractor was placed for exposure. Strap muscles were incised in the midline and dissection was begun on the left side.  Strap muscles were reflected laterally.  Left thyroid lobe was normal in size with a dominant firm central nodule.  The left lobe was  gently mobilized with blunt dissection. Superior pole vessels were dissected out and divided individually between small and medium ligaclips with the harmonic scalpel. The thyroid lobe was rolled anteriorly. Branches of the inferior thyroid artery were divided between small ligaclips with the harmonic scalpel. Inferior venous tributaries were divided between ligaclips. Both the superior and inferior parathyroid glands were identified and preserved on their vascular pedicles. The recurrent laryngeal nerve was identified and preserved along its course. The ligament of Gwenlyn Found was released with the electrocautery and the gland was mobilized onto the anterior trachea. Isthmus was mobilized across the midline. There was no pyramidal lobe present. Dry pack was placed in the left neck.  The right thyroid lobe was gently mobilized with blunt dissection. Right thyroid lobe was normal in size with small nodules. Superior pole vessels were dissected out and divided between small and medium ligaclips with the Harmonic scalpel. Superior parathyroid was identified and preserved. Inferior venous tributaries were divided between medium ligaclips with the harmonic scalpel. The right thyroid lobe was rolled anteriorly and the branches of the inferior thyroid artery divided between small ligaclips. The right recurrent laryngeal nerve was identified and preserved along its course. The ligament of Gwenlyn Found was released with the electrocautery. The right thyroid lobe was mobilized onto the anterior trachea and the remainder of the thyroid was dissected off the anterior trachea and the thyroid was completely excised. A suture was used to mark the left lobe. The entire thyroid gland was submitted to pathology for review.  The neck was irrigated with warm saline. Fibrillar was placed throughout the operative field. Strap muscles were approximated in the midline with interrupted 3-0 Vicryl sutures. Platysma was closed  with interrupted 3-0  Vicryl sutures. Skin was closed with a running 4-0 Monocryl subcuticular suture. Wound was washed and Dermabond was applied. The patient was awakened from anesthesia and brought to the recovery room. The patient tolerated the procedure well.   [name], MD [facility] Office: [phone]

## 2020-08-02 NOTE — Interval H&P Note (Signed)
History and Physical Interval Note:  08/02/2020 7:11 AM  Megan Olson  has presented today for surgery, with the diagnosis of PAPILLARY THYROID CARCINOMA.  The various methods of treatment have been discussed with the patient and family. After consideration of risks, benefits and other options for treatment, the patient has consented to    Procedure(s): TOTAL THYROIDECTOMY (N/A) as a surgical intervention.    The patient's history has been reviewed, patient examined, no change in status, stable for surgery.  I have reviewed the patient's chart and labs.  Questions were answered to the patient's satisfaction.    [name], MD [facility] Office: [place]

## 2020-08-03 ENCOUNTER — Encounter (HOSPITAL_COMMUNITY): Payer: Self-pay | Admitting: Surgery

## 2020-08-03 DIAGNOSIS — C73 Malignant neoplasm of thyroid gland: Secondary | ICD-10-CM | POA: Diagnosis not present

## 2020-08-03 DIAGNOSIS — Z87891 Personal history of nicotine dependence: Secondary | ICD-10-CM | POA: Diagnosis not present

## 2020-08-03 LAB — BASIC METABOLIC PANEL
Anion gap: 9 (ref 5–15)
BUN: 23 mg/dL (ref 8–23)
CO2: 25 mmol/L (ref 22–32)
Calcium: 9.8 mg/dL (ref 8.9–10.3)
Chloride: 104 mmol/L (ref 98–111)
Creatinine, Ser: 0.77 mg/dL (ref 0.44–1.00)
GFR, Estimated: >60 mL/min (ref >60)
Glucose, Bld: 105 mg/dL — ABNORMAL HIGH (ref 70–99)
Potassium: 3.8 mmol/L (ref 3.5–5.1)
Sodium: 138 mmol/L (ref 135–145)

## 2020-08-03 MED ORDER — SLOW-MAG 71.5-119 MG PO TBEC: 1.0000 | DELAYED_RELEASE_TABLET | Freq: Every day | ORAL | 0 refills | Status: AC

## 2020-08-03 NOTE — Progress Notes (Signed)
Patient was given discharge instructions, and all questions were answered.  Patient was taken by wheelchair to main exit.

## 2020-08-03 NOTE — Discharge Summary (Signed)
Physician Discharge Summary  Patient ID: Megan Olson MRN: [id number] DOB/AGE: 1941-01-26 79 y.o.  Admit date: 08/02/2020 Discharge date: 08/03/2020  Admission Diagnoses: Papillary thyroid carcinoma  Discharge Diagnoses:  Principal Problem:   Papillary thyroid carcinoma [facility])   Discharged Condition: good  Hospital Course: The patient underwent a total thyroidectomy on 08/02/20. For further details of the procedure, please see separately dictated operative note. Postoperatively she was admitted to the med-surg floor for recovery. She was advanced to a regular diet, which she tolerated without difficulty. On the morning of POD1 her labs were unremarkable, and serum calcium level was normal. She denied hoarseness, dysphagia, and shortness of breath. She was tolerating a diet without difficulty and vital signs were within normal limits. She was examined and deemed appropriate for discharge home. She will continue calcium supplements at discharge and begin levothyroxine.  Consults: None  Significant Diagnostic Studies: None  Treatments: surgery: total thyroidectomy  Discharge Exam: Blood pressure (!) 165/92, pulse 81, temperature 98.1 F (36.7 C), temperature source Oral, resp. rate 18, height 5' 2.5" (1.588 m), weight 49.9 kg, SpO2 98 %. General appearance: alert, cooperative and appears stated age Head: Normocephalic, without obvious abnormality, atraumatic Eyes: EOM in tact, no scleral icterus Neck: incision clean and dry with no swelling or erythema, no signs of hematoma Resp: normal work of breathing on room air Extremities: extremities normal, atraumatic, no cyanosis or edema Skin: Skin color, texture, turgor normal. No rashes or lesions Neurologic: Grossly normal Incision/Wound: neck incision c/d/i, no evidence of hematoma  Disposition: Discharge disposition: 01-Home or Self Care       Discharge Instructions    Call MD for:  difficulty breathing, headache or visual  disturbances   Complete by: As directed    Call MD for:  persistant nausea and vomiting   Complete by: As directed    Call MD for:  redness, tenderness, or signs of infection (pain, swelling, redness, odor or green/yellow discharge around incision site)   Complete by: As directed    Call MD for:  severe uncontrolled pain   Complete by: As directed    Call MD for:  temperature >100.4   Complete by: As directed    Diet general   Complete by: As directed    Increase activity slowly   Complete by: As directed      Allergies as of 08/03/2020      Reactions   Ace Inhibitors Cough   Benadryl [diphenhydramine] Other (See Comments)   Heart races   Diamox [acetazolamide]    Hypotensive event at ophthalmologist   Sulfamethoxazole    REACTION: rash   Lenalidomide Rash   Penicillins Rash   REACTION: rash Did it involve swelling of the face/tongue/throat, SOB, or low BP? Yes Did it involve sudden or severe rash/hives, skin peeling, or any reaction on the inside of your mouth or nose? No Did you need to seek medical attention at a hospital or doctor's office? No When did it last happen?more than 10 years ago If all above answers are "NO", may proceed with cephalosporin use.      Medication List    TAKE these medications   acyclovir 400 MG tablet Commonly known as: ZOVIRAX Take 1 tablet (400 mg total) by mouth 2 (two) times daily.   Alphagan P 0.1 % Soln Generic drug: brimonidine Place 1 drop into both eyes in the morning, at noon, and at bedtime.   amLODipine 5 MG tablet Commonly known as: NORVASC Take 1 tablet (5  mg total) by mouth daily. What changed: when to take this   aspirin EC 81 MG tablet Take 81 mg by mouth daily.   bimatoprost 0.03 % ophthalmic solution Commonly known as: LUMIGAN Place 1 drop into both eyes at bedtime.   calcium carbonate 500 MG chewable tablet Commonly known as: Tums Chew 2 tablets (400 mg of elemental calcium total) by mouth 2 (two)  times daily.   Co-Enzyme Q-10 100 MG Caps Take 100 mg by mouth daily.   dorzolamide-timolol 22.3-6.8 MG/ML ophthalmic solution Commonly known as: COSOPT Place 1 drop into both eyes 2 (two) times daily.   fentaNYL 12 MCG/HR Commonly known as: [place] 1 patch onto the skin every 3 (three) days.   hydrOXYzine 25 MG tablet Commonly known as: ATARAX/VISTARIL Take 1 tablet (25 mg total) by mouth 3 (three) times daily as needed.   levothyroxine 88 MCG tablet Commonly known as: Synthroid Take 1 tablet (88 mcg total) by mouth daily before breakfast.   magnesium hydroxide 400 MG/5ML suspension Commonly known as: Milk of Magnesia Take 15 mLs by mouth daily as needed for mild constipation or moderate constipation.   multivitamin with minerals tablet Take 1 tablet by mouth daily. Centrum Silver 50 +   Ninlaro 3 MG capsule Generic drug: ixazomib citrate TAKE 1 CAP (3 MG) BY MOUTH WEEKLY, 3 WEEKS ON, 1 WEEK OFF, REPEAT EVERY 4 WEEKS. TAKE ON AN EMPTY STOMACH 1HR BEFORE OR 2HR AFTER MEAL What changed: See the new instructions.   Omega-3 1000 MG Caps Take 1,000 mg by mouth daily.   oxyCODONE 5 MG immediate release tablet Commonly known as: Oxy IR/ROXICODONE Take 1-2 tablets (5-10 mg total) by mouth every 6 (six) hours as needed for moderate pain.   oxyCODONE-acetaminophen 5-325 MG tablet Commonly known as: Percocet Take 1-2 tablets by mouth every 4 (four) hours as needed for moderate pain or severe pain.   polyethylene glycol 17 g packet Commonly known as: MiraLax Take 17 g by mouth daily.   senna-docusate 8.6-50 MG tablet Commonly known as: Senokot-S Take 2 tablets by mouth at bedtime. What changed: how much to take   simethicone 80 MG chewable tablet Commonly known as: MYLICON Chew 80 mg by mouth every evening.   Slow-Mag 64 MG Tbec SR tablet Generic drug: magnesium chloride Take 1 tablet (64 mg total) by mouth daily. What changed:   medication strength  how  much to take   vitamin C 500 MG tablet Commonly known as: ASCORBIC ACID Take 500 mg by mouth 2 (two) times a week.   Vitamin D3 125 MCG (5000 UT) Tabs Take 5,000 Units by mouth daily.       Follow-up Information    [name], MD. Schedule an appointment as soon as possible for a visit in 3 weeks.   Specialty: General Surgery Why: For wound re-check Contact information: [place] [address] [phone]               Signed: Dwan Bolt 08/03/2020, 8:42 AM

## 2020-08-05 LAB — SURGICAL PATHOLOGY

## 2020-08-07 ENCOUNTER — Other Ambulatory Visit: Payer: Self-pay

## 2020-08-07 ENCOUNTER — Inpatient Hospital Stay: Payer: Medicare HMO | Attending: Hematology

## 2020-08-07 DIAGNOSIS — C9000 Multiple myeloma not having achieved remission: Secondary | ICD-10-CM | POA: Insufficient documentation

## 2020-08-07 DIAGNOSIS — M8580 Other specified disorders of bone density and structure, unspecified site: Secondary | ICD-10-CM | POA: Diagnosis not present

## 2020-08-07 DIAGNOSIS — Z87891 Personal history of nicotine dependence: Secondary | ICD-10-CM | POA: Insufficient documentation

## 2020-08-07 DIAGNOSIS — Z5111 Encounter for antineoplastic chemotherapy: Secondary | ICD-10-CM

## 2020-08-07 DIAGNOSIS — K5900 Constipation, unspecified: Secondary | ICD-10-CM | POA: Insufficient documentation

## 2020-08-07 DIAGNOSIS — I700 Atherosclerosis of aorta: Secondary | ICD-10-CM | POA: Insufficient documentation

## 2020-08-07 DIAGNOSIS — Z79899 Other long term (current) drug therapy: Secondary | ICD-10-CM | POA: Diagnosis not present

## 2020-08-07 DIAGNOSIS — M549 Dorsalgia, unspecified: Secondary | ICD-10-CM | POA: Diagnosis not present

## 2020-08-07 DIAGNOSIS — D649 Anemia, unspecified: Secondary | ICD-10-CM | POA: Insufficient documentation

## 2020-08-07 DIAGNOSIS — Z806 Family history of leukemia: Secondary | ICD-10-CM | POA: Diagnosis not present

## 2020-08-07 DIAGNOSIS — I10 Essential (primary) hypertension: Secondary | ICD-10-CM | POA: Diagnosis not present

## 2020-08-07 LAB — CBC WITH DIFFERENTIAL/PLATELET
Abs Immature Granulocytes: 0.02 10*3/uL (ref 0.00–0.07)
Basophils Absolute: 0 10*3/uL (ref 0.0–0.1)
Basophils Relative: 1 %
Eosinophils Absolute: 0.1 10*3/uL (ref 0.0–0.5)
Eosinophils Relative: 1 %
HCT: 36 % (ref 36.0–46.0)
Hemoglobin: 11.6 g/dL — ABNORMAL LOW (ref 12.0–15.0)
Immature Granulocytes: 1 %
Lymphocytes Relative: 9 %
Lymphs Abs: 0.4 10*3/uL — ABNORMAL LOW (ref 0.7–4.0)
MCH: 33 pg (ref 26.0–34.0)
MCHC: 32.2 g/dL (ref 30.0–36.0)
MCV: 102.6 fL — ABNORMAL HIGH (ref 80.0–100.0)
Monocytes Absolute: 0.5 10*3/uL (ref 0.1–1.0)
Monocytes Relative: 13 %
Neutro Abs: 3.2 10*3/uL (ref 1.7–7.7)
Neutrophils Relative %: 75 %
Platelets: 244 10*3/uL (ref 150–400)
RBC: 3.51 MIL/uL — ABNORMAL LOW (ref 3.87–5.11)
RDW: 14.2 % (ref 11.5–15.5)
WBC: 4.2 10*3/uL (ref 4.0–10.5)
nRBC: 0 % (ref 0.0–0.2)

## 2020-08-07 LAB — CMP (CANCER CENTER ONLY)
ALT: 13 U/L (ref 0–44)
AST: 16 U/L (ref 15–41)
Albumin: 4.1 g/dL (ref 3.5–5.0)
Alkaline Phosphatase: 31 U/L — ABNORMAL LOW (ref 38–126)
Anion gap: 6 (ref 5–15)
BUN: 14 mg/dL (ref 8–23)
CO2: 30 mmol/L (ref 22–32)
Calcium: 9.6 mg/dL (ref 8.9–10.3)
Chloride: 105 mmol/L (ref 98–111)
Creatinine: 0.69 mg/dL (ref 0.44–1.00)
GFR, Estimated: >60 mL/min (ref >60)
Glucose, Bld: 100 mg/dL — ABNORMAL HIGH (ref 70–99)
Potassium: 4 mmol/L (ref 3.5–5.1)
Sodium: 141 mmol/L (ref 135–145)
Total Bilirubin: 0.5 mg/dL (ref 0.3–1.2)
Total Protein: 6.7 g/dL (ref 6.5–8.1)

## 2020-08-08 LAB — MULTIPLE MYELOMA PANEL, SERUM
Albumin SerPl Elph-Mcnc: 4.1 g/dL (ref 2.9–4.4)
Albumin/Glob SerPl: 1.7 (ref 0.7–1.7)
Alpha 1: 0.3 g/dL (ref 0.0–0.4)
Alpha2 Glob SerPl Elph-Mcnc: 0.8 g/dL (ref 0.4–1.0)
B-Globulin SerPl Elph-Mcnc: 1.1 g/dL (ref 0.7–1.3)
Gamma Glob SerPl Elph-Mcnc: 0.4 g/dL (ref 0.4–1.8)
Globulin, Total: 2.5 g/dL (ref 2.2–3.9)
IgA: 25 mg/dL — ABNORMAL LOW (ref 64–422)
IgG (Immunoglobin G), Serum: 577 mg/dL — ABNORMAL LOW (ref 586–1602)
IgM (Immunoglobulin M), Srm: 24 mg/dL — ABNORMAL LOW (ref 26–217)
M Protein SerPl Elph-Mcnc: 0.1 g/dL — ABNORMAL HIGH
Total Protein ELP: 6.6 g/dL (ref 6.0–8.5)

## 2020-08-13 NOTE — Progress Notes (Signed)
HEMATOLOGY/ONCOLOGY CLINIC NOTE  Date of Service: 08/14/2020  Patient Care Team: [name], MD as PCP - General (Family Medicine) [name], MD as Consulting Physician (Hematology) Bond, [name], MD as Referring Physician (Ophthalmology) [name], [name], MD as Consulting Physician (Hematology and Oncology) [name], MD as Consulting Physician (Internal Medicine) [name], MD as Consulting Physician (General Surgery)  Dr. Ronney Lion as [place] Specialist  [phone]  CHIEF COMPLAINTS/PURPOSE OF CONSULTATION:   Multiple Myeloma- continued management  HISTORY OF PRESENTING ILLNESS:   Megan Olson is a wonderful 79 y.o. female who has been referred to [place] by Dr. 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 She is accompanied today by her husband. The pt reports that she is doing well overall.   The pt notes that she was doing extensive yard work in October 2019, and began to feel back pain a few days after this, which she attributed to muscle pain. She recalls taking a deep breath, and developing sudden acute pain, and presented to care with her PCP's office. She began exercises for her back pain, without relief, then began PT without relief, then was referred to Dr. Paulla Fore in sports medicine in late January 2020. She had an XR which revealed a compression fracture at T11, then subsequently had an MRI, and a bone marrow biopsy. The pt notes that her back pain "seemed to move around." She endorses pain "up the whole left side" of her back.  The pt notes that she is not able to stand up straight due to her back pain, which she feels limits her ability to take a deep breath, and endorses pain exacerbation when she does take a deep breath. The pt needs assistance from sitting to lying from her husband. She is using about 3 Percocet a day.  The pt notes worsened constipation since  beginning Percocet, and notes that her most recent laxative order was not covered by her insurance. She is using prune juice and milk of magnesia. She took Senokot S BID without relief.  The pt reports that prior to her recent back pain, she had very few medical concerns. She endorses controlled BP, and has been monitored for DM but after closely watching her diet her A1C decreased to 5.8. She has glaucoma, and has had surgery in both eyes. Right eye with stent. The pt sees Dr. Edilia Bo at [facility] for her eye care. The pt notes that her vision has been recently "pretty good." The pt notes that she has been advised to limit treatment with steroids for her glaucoma. She denies heart or lung problems. Denies previous back problems. Last DEXA scan 3 years ago, and endorses osteopenia. She notes that she took Fosamax for 3-4 years, and stopped 5-6 years ago. She takes Vitamin D, a multivitamin, and magnesium.  The pt notes that she quit smoking cigarettes when she was 27, started when she was about 20. The pt consumes alcohol rarely, but not since beginning narcotics. 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  Of note prior to the patient's visit today, pt has had an MRI Thoracic Spine completed on 11/27/18 with results revealing Multifocal marrow signal abnormality consistent with metastatic disease or multiple myeloma. The patient has multiple compression fractures most consistent with pathologic injuries. Extensive marrow signal abnormality makes determining age of the fractures difficult but edema is most intense in T3. Epidural tumor centrally and to the left posterior to T3  extends into the left neural foramen and could impact the left T3 root.  Most recent lab results (12/01/18) of CBC w/diff is as follows: all values are WNL except for RBC at 3.17, HGB at 10.4, HCT at 33.5, MCV at 105.7. 11/28/18 CMP revealed all values WNL except for Glucose at 105, Total Protein at 10.2, Albumin at 3.1 11/30/18 24HR  UPEP revealed all values WNL except for M-spike at 75.1%. 11/28/18 Bega-2 microglobulin slightly elevated at 2.6 11/28/18 SPEP revealed M spike at 4.6g 11/28/18 Immunoglobulins revealed IgG at 6181, IgA at 24, IgM at 16, and IgE at 6.  On review of systems, pt reports constipation, back pain, stable energy levels, ankle swelling, tenderness at T3, lower back pain, and denies abdominal pains, neck pain, and any other symptoms.   On PMHx the pt reports redundant colon, single hemorrhoid, glaucoma, HTN, osteopenia, Tonsillectomy, right eye stent and multiple eye surgeries. On Social Hx the pt reports rare alcohol use, smoked cigarettes between ages 51 and 43. 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 On Family Hx the pt reports sister died from small cell lung cancer and was a lifelong smoker, brother's daughter died of breast cancer with BRCA1 and BRCA2 mutations. Cousin with female breast cancer.  INTERVAL HISTORY:  Megan Olson returns today for management and evaluation of her multiple myeloma. The patient's last visit with [place] was on 06/20/2020. The pt reports that she is doing well overall.  The pt reports that she had her Total Thyroidectomy nearly two weeks ago. She notes that the procedure went well.   Pt had loose stools for a day and a half, which have since resolved. Pt was placed on 2000 mg Calcium by Dr. Harlow Asa. Dr. Harlow Asa also placed pt on Synthroid and reduced her to one Slow-Mag daily. She is meeting with Dr. Cruzita Lederer to consider completing Radioactive Iodine Ablation.   Her pain is well controlled with 12 mcg/hr Fentanyl Patch and 1.5 Oxycodone per day. She has been off of Ninlaro for two weeks and plans to restart soon.   Lab results (08/07/20) of CBC w/diff and CMP is as follows: all values are WNL except for RBC at 3.51, Hgb at 11.6, MCV at 102.6, Lymphs Abs at 0.4K, Glucose at 100, ALP at 31. 08/07/2020 MMP shows all values are WNL except for IgG at 577, IgA at 25, IgM at 24, M Protein At  0.1 g/dL.  On review of systems, pt denies new bone pain and any other symptoms.    MEDICAL HISTORY:  Past Medical History:  Diagnosis Date  . Anemia   . Cancer ([place])    multiple myeloma, thyroid cancer   . Glaucoma   . HYPERTENSION 03/11/2007  . Multiple myeloma ([place])   . OSTEOPENIA 03/11/2007  . Pre-diabetes     SURGICAL HISTORY: Past Surgical History:  Procedure Laterality Date  . BONE MARROW BIOPSY    . BREAST EXCISIONAL BIOPSY Right 2000  . BREAST LUMPECTOMY  1990   benign  . CATARACT EXTRACTION Bilateral 2018  . DILATION AND CURETTAGE OF UTERUS     bleeding at menopause. No uterine cancer  . IR RADIOLOGIST EVAL & MGMT  12/13/2018  . THYROIDECTOMY N/A 08/02/2020   Procedure: TOTAL THYROIDECTOMY;  Surgeon: [name], MD;  Location: [facility];  Service: General;  Laterality: N/A;  . TONSILLECTOMY     age 25    SOCIAL HISTORY: Social History   Socioeconomic History  . Marital status: Married    Spouse name: Not  on file  . Number of children: 1  . Years of education: Not on file  . Highest education level: Not on file  Occupational History  . Occupation: Retired  Tobacco Use  . Smoking status: Former Smoker    Packs/day: 0.50    Years: 7.00    Pack years: 3.50    Types: Cigarettes    Quit date: 01/04/1961    Years since quitting: 59.6  . Smokeless tobacco: Never Used  Vaping Use  . Vaping Use: Never used  Substance and Sexual Activity  . Alcohol use: Yes    Alcohol/week: 1.0 standard drink    Types: 1 Standard drinks or equivalent per week    Comment: occas   . Drug use: No  . Sexual activity: Not Currently  Other Topics Concern  . Not on file  Social History Narrative   Married. Lives with husband (patient of Dr. Yong Channel). 1 son. No grandkids. 1 granddog.       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 Difficulty of Paying Living Expenses: Not on file  Food Insecurity:   . Worried About Charity fundraiser in the Last Year: Not on file  . Ran Out of Food in the Last Year: Not on file  Transportation Needs:   . Lack of Transportation (Medical): Not on file  . Lack of Transportation (Non-Medical): Not on file  Physical Activity:   . Days of Exercise per Week: Not on file  . Minutes of Exercise per Session: Not on file  Stress:   . Feeling of Stress : Not on file  Social Connections:   . Frequency of Communication with Friends and Family: Not on file  . Frequency of Social Gatherings with Friends and Family: Not on file  . Attends Religious Services: Not on file  . Active Member of Clubs or Organizations: Not on file  . Attends Archivist Meetings: Not on file  . Marital Status: Not on file  Intimate Partner Violence:   . Fear of Current or Ex-Partner: Not on file  . Emotionally Abused: Not on file  . Physically Abused: Not on file  . Sexually Abused: Not on file    FAMILY HISTORY: Family History  Problem Relation Age of Onset  . Heart disease Mother        CHF mother died 28  . Arthritis Mother   . Glaucoma Mother        sister as well  . Alcohol abuse Father   . Suicidality Father   . Cancer Sister        lung cancer, smoker  . Heart disease Sister        aortic valve replacement  . Hyperlipidemia Brother   . Hypertension Brother   . COPD Brother   . Arthritis Sister   . Hypertension Sister   . Glaucoma Sister   . Hashimoto's thyroiditis Sister   . Hypertension Son   . Stroke Maternal Grandmother   . Colon cancer Neg Hx   . Colon polyps Neg Hx     ALLERGIES:  is allergic to ace inhibitors, benadryl [diphenhydramine], diamox [acetazolamide], sulfamethoxazole, lenalidomide, and penicillins.   MEDICATIONS:  Current Outpatient Medications  Medication Sig Dispense Refill  . acyclovir (ZOVIRAX) 400 MG tablet Take 1  tablet (400 mg total) by mouth 2 (two) times daily. 60 tablet 3  . ALPHAGAN P 0.1 % SOLN Place 1 drop into both eyes in the morning, at noon, and at bedtime.     Marland Kitchen amLODipine (NORVASC) 5 MG tablet Take 1 tablet (5 mg total) by mouth daily. (Patient taking differently: Take 5 mg by mouth at bedtime. ) 90 tablet 3  . aspirin EC 81 MG tablet Take 81 mg by mouth daily.    . bimatoprost (LUMIGAN) 0.03 % ophthalmic solution Place 1 drop into both eyes at bedtime.     . calcium carbonate (TUMS) 500 MG chewable tablet Chew 2 tablets (400 mg of elemental calcium total) by mouth 2 (two) times daily. 90 tablet 1  . Cholecalciferol (VITAMIN D3) 125 MCG (5000 UT) TABS Take 5,000 Units by mouth daily.     Marland Kitchen Co-Enzyme Q-10 100 MG CAPS Take 100 mg by mouth daily.     . dorzolamide-timolol (COSOPT) 22.3-6.8 MG/ML ophthalmic solution Place 1 drop into both eyes 2 (two) times daily.   11  . fentaNYL (DURAGESIC) 12 MCG/HR Place 1 patch onto the skin every 3 (three) days. 10 patch 0  . levothyroxine (SYNTHROID) 88 MCG tablet Take 1 tablet (88 mcg total) by mouth daily before breakfast. 30 tablet 3  . magnesium chloride (SLOW-MAG) 64 MG TBEC SR tablet Take 1 tablet (64 mg total) by mouth daily. 30 tablet 0  . Multiple Vitamins-Minerals (MULTIVITAMIN WITH MINERALS) tablet Take 1 tablet by mouth daily. Centrum Silver 50 +    . NINLARO 3 MG capsule TAKE 1 CAP (3 MG) BY MOUTH WEEKLY, 3 WEEKS ON, 1 WEEK OFF, REPEAT EVERY 4 WEEKS. TAKE ON AN EMPTY STOMACH 1HR BEFORE OR 2HR AFTER MEAL (Patient taking differently: Take 3 mg by mouth once a week. ) 3 capsule 2  . Omega-3 1000 MG CAPS Take 1,000 mg by mouth daily.     Marland Kitchen oxyCODONE-acetaminophen (PERCOCET) 5-325 MG tablet Take 1-2 tablets by mouth every 4 (four) hours as needed for moderate pain or severe pain. 90 tablet 0  . polyethylene glycol (MIRALAX) packet Take 17 g by mouth daily. 30 each 1  . senna-docusate (SENOKOT-S) 8.6-50 MG tablet Take 2 tablets by mouth at bedtime.  (Patient taking differently: Take 1 tablet by mouth at bedtime. ) 60 tablet 2  . vitamin C (ASCORBIC ACID) 500 MG tablet Take 500 mg by mouth 2 (two) times a week.     . hydrOXYzine (ATARAX/VISTARIL) 25 MG tablet Take 1 tablet (25 mg total) by mouth 3 (three) times daily as needed. (Patient not taking: Reported on 07/29/2020) 30 tablet 0  . magnesium hydroxide (MILK OF MAGNESIA) 400 MG/5ML suspension Take 15 mLs by mouth daily as needed for mild constipation or moderate constipation. (Patient not taking: Reported on 07/29/2020) 355 mL 0  . oxyCODONE (OXY IR/ROXICODONE) 5 MG immediate release tablet Take 1-2 tablets (5-10 mg total) by mouth every 6 (six) hours as needed for moderate pain. (Patient not taking: Reported on 08/14/2020) 15 tablet 0  . simethicone (MYLICON) 80 MG chewable tablet Chew 80 mg by mouth every evening. (Patient not taking: Reported on 08/14/2020)     No current facility-administered medications for this visit.    REVIEW OF SYSTEMS:   A 10+ POINT REVIEW OF SYSTEMS WAS OBTAINED including neurology, dermatology, psychiatry, cardiac, respiratory, lymph, extremities, GI, GU, Musculoskeletal, constitutional, breasts, reproductive, HEENT.  All pertinent positives are noted in the HPI.  All others are negative.  PHYSICAL EXAMINATION: ECOG PERFORMANCE STATUS: 1-2  Vitals:   08/14/20 0957  BP: (!) 180/90  Pulse: 70  Resp: 18  Temp: 99.6 F (37.6 C)  SpO2: 98%   Filed Weights   08/14/20 0957  Weight: 110 lb 6.4 oz (50.1 kg)   Body mass index is 19.87 kg/m.   GENERAL:alert, in no acute distress and comfortable SKIN: no acute rashes, no significant lesions EYES: conjunctiva are pink and non-injected, sclera anicteric OROPHARYNX: MMM, no exudates, no oropharyngeal erythema or ulceration NECK: supple, no JVD LYMPH:  no palpable lymphadenopathy in the cervical, axillary or inguinal regions LUNGS: clear to auscultation b/l with normal respiratory effort HEART: regular  rate & rhythm ABDOMEN:  normoactive bowel sounds , non tender, not distended. No palpable hepatosplenomegaly.  Extremity: no pedal edema PSYCH: alert & oriented x 3 with fluent speech NEURO: no focal motor/sensory deficits  LABORATORY DATA:  I have reviewed the data as listed  . CBC Latest Ref Rng & Units 08/07/2020 08/01/2020 06/13/2020  WBC 4.0 - 10.5 K/uL 4.2 3.5(L) 5.2  Hemoglobin 12.0 - 15.0 g/dL 11.6(L) 12.6 11.9(L)  Hematocrit 36 - 46 % 36.0 38.4 36.8  Platelets 150 - 400 K/uL 244 234 249    . CMP Latest Ref Rng & Units 08/07/2020 08/03/2020 08/01/2020  Glucose 70 - 99 mg/dL 100(H) 105(H) 110(H)  BUN 8 - 23 mg/dL _0 Creatinine 0.44 - 1.00 mg/dL 0.69 0.77 0.57  Sodium 135 - 145 mmol/L 141 138 142  Potassium 3.5 - 5.1 mmol/L 4.0 3.8 4.6  Chloride 98 - 111 mmol/L 105 104 105  CO2 22 - 32 mmol/L _1 Calcium 8.9 - 10.3 mg/dL 9.6 9.8 9.6  Total Protein 6.5 - 8.1 g/dL 6.7 - -  Total Bilirubin 0.3 - 1.2 mg/dL 0.5 - -  Alkaline Phos 38 - 126 U/L 31(L) - -  AST 15 - 41 U/L 16 - -  ALT 0 - 44 U/L 13 - -   09/11/2019 Flow Pathology Report [id number]):   09/11/2019 Bone Marrow Report [id number]):    07/14/2019 Cytogenetics [id number]):    07/14/2019 Flow Pathology Report [id number]):   07/14/2019 BM Bx ([id number]):    12/01/18 BM Bx:   12/01/18 Flow Cytometry:       RADIOGRAPHIC STUDIES: I have personally reviewed the radiological images as listed and agreed with the findings in the report. DG Chest 2 View per protocol  Result Date: 08/03/2020 CLINICAL DATA:  Preoperative assessment for thyroid surgery EXAM: CHEST - 2 VIEW COMPARISON:  12/19/2018 FINDINGS: Frontal and lateral views of the chest demonstrate an unremarkable cardiac silhouette. No acute airspace disease, effusion, or pneumothorax. No acute bony abnormalities. Chronic lower thoracic wedge compression deformities are stable. IMPRESSION: 1. Stable exam, no acute process.  Electronically Signed   By: Randa Ngo M.D.   On: 08/03/2020 10:11     ASSESSMENT & PLAN:  79 y.o. female with  1. Multiple Myeloma with IgG Lambda specificity Labs upon initial presentation from 12/01/18 CBC w/diff revealed HGB at 10.4 with MCV of 105.7. 11/28/18 CMP revealed Creatinine normal at 0.68 and Calcium normal at 8.9. 11/28/18 Beta 2 microglobulin at 2.48m (also a reading at 4.765mon the same day). 11/30/18 24HR UPEP revealed M spike at 75.1%. 11/28/18 SPEP revealed M spike of 4.6g. 11/28/18 Immunoglobulins revealed IgG elevated at 618124m 12/01/18 BM Bx revealed hypercellular bone marrow with 70-80% CD138 immunohistochemistry (44% aspirate) lambda-restricted plasma cells as well as a kappa restricted  population of B-cells 11/27/18 MRI Thoracic Spine which revealed Multifocal marrow signal abnormality consistent with metastatic disease or multiple myeloma. The patient has multiple compression fractures most consistent with pathologic injuries. Extensive marrow signal abnormality makes determining age of the fractures difficult but edema is most intense in T3. 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 2. Areas of hypermetabolic disease identified in both lungs suggesting multiple myeloma involvement. 3. 1.9 cm calcified left thyroid nodule is hypermetabolic, but Indeterminate. 4. Colon is diffusely distended with gas and stool. Imaging features would be compatible with clinical constipation. 5.  Aortic Atherosclerois.  Pt describes grade II to III rash on her re-challenge from Revlimid with optimized pre-medications, and we discontinued  Revlimid  Began Cytoxan with C4 Velcade and Dexamethasone  05/16/2019 M spike is down to 0.2g/dl showing continued improvement and a 90% reduction  -07/14/2019 BM Bx revealed BONE MARROW: - Hypercellular marrow with residual plasma cell neoplasm (<10%) PERIPHERAL BLOOD: - Pancytopenia".   07/17/2019 PET/CT Whole Body Scan ([id number]) revealed "1. 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 2. Hypermetabolic calcified left thyroid nodule, as before, indeterminate. 3. Aortic atherosclerosis (ICD10-170.0). Coronary artery calcification."  09/11/2019 Bone Marrow Report ([id number]) revealed "BONE MARROW, ASPIRATE, CLOT, CORE:  -Variably cellular bone marrow with trilineage hematopoiesis and 2% plasma cells PERIPHERAL BLOOD:  -Pancytopenia".  09/11/2019 Flow Pathology Report [id number]) revealed "-Predominance of T lymphocytes with relative abundance of CD8 positive cells  -No significant B-cell population identified -Relative abundance of natural killer cells".  09/14/2019 PET/CT ([id number]) revealed "1. No findings of active malignancy. 2. Numerous thoracolumbar compression fractures many of which may be associated with osteoporosis. 3. Calcified 1.5 cm in diameter left thyroid nodule with persistent elevated activity. A significant minority of hypermetabolic thyroid nodules can be malignant, if not previously worked up to then thyroid ultrasound would be recommended as a next step. 4. There a few small lucent and small sclerotic lesions in the skeleton which are not hypermetabolic, possibilities include benign lesions or previously effectively treated myeloma. 5. Other imaging findings of potential clinical significance: Aortic Atherosclerosis (ICD10-I70.0). Coronary atherosclerosis. Prominent stool throughout the colon favors constipation. Hyperdense right renal lesion is probably a complex cyst but technically nonspecific."  2. Monoclonal  B-Cell Lymphocytosis -based on BM Bx Have discussed that the patient's Monoclonal B-cell lymphocytosis is a precursor to CLL, and that we will watchfully observe this over time and is not imminently concerning. She does not currently have elevated lymphocyte counts on peripheral blood draws.   PLAN: -Discussed pt labwork, 08/07/20; blood counts and chemistries are steady, M Protein at 0.1 g/dL - could be inflammation from recent surgery, will continue to watch.  -No lab or clinical evidence of significant Multiple Myeloma or Monoclonal B-Cell Lymphocytosis progression at this time.  -The pt has no prohibitive toxicities from continuing Ninlaro (3 weeks on & 1 week off) at this time. -Recommend pt hold other medications and food for 1-1.5 hours after taking her Synthroid for improved absorption.  -Discussed proper safety precautions especially when gathering socially.  -Recommend pt f/u with Dr. Cruzita Lederer as scheduled. -Continue 12.5 mcg/hr Fentanyl patch & Oxycodone prn for pain  management.  -Continue 5000 UT Vitamin D daily. Goal Vitamin D is 60 <>90  -Continue Zometa q8weeks  -Will see back in 8 weeks, with labs 1 week prior   FOLLOW UP: RTC with Dr Irene Limbo with labs and Zometa infusion in 8 weeks Please schedule the lab appointment 1 week prior to clinic visit  (patient prefers Wednesday appointments -- cannot do thursdays)   The total time spent in the appt was 20 minutes and more than 50% was on counseling and direct patient cares.  All of the patient's questions were answered with apparent satisfaction. The patient knows to call the clinic with any problems, questions or concerns.    Sullivan Lone MD [place] AAHIVMS [facility] Hematology/Oncology Physician [facility]  (Office):       [phone] (Work cell):  [phone] (Fax):           [phone]  08/14/2020 11:29 AM  I, Yevette Edwards, am acting as a scribe for Dr. Sullivan Lone.   .I have reviewed the above  documentation for accuracy and completeness, and I agree with the above. [name] MD

## 2020-08-14 ENCOUNTER — Other Ambulatory Visit: Payer: Self-pay

## 2020-08-14 ENCOUNTER — Inpatient Hospital Stay: Payer: Medicare HMO

## 2020-08-14 ENCOUNTER — Inpatient Hospital Stay (HOSPITAL_BASED_OUTPATIENT_CLINIC_OR_DEPARTMENT_OTHER): Payer: Medicare HMO | Admitting: Hematology

## 2020-08-14 VITALS — BP 180/90 | HR 70 | Temp 99.6°F | Resp 18 | Ht 62.5 in | Wt 110.4 lb

## 2020-08-14 DIAGNOSIS — Z5111 Encounter for antineoplastic chemotherapy: Secondary | ICD-10-CM

## 2020-08-14 DIAGNOSIS — C9000 Multiple myeloma not having achieved remission: Secondary | ICD-10-CM

## 2020-08-14 DIAGNOSIS — Z7189 Other specified counseling: Secondary | ICD-10-CM

## 2020-08-14 MED ORDER — ZOLEDRONIC ACID 4 MG/100ML IV SOLN
4.0000 mg | Freq: Once | INTRAVENOUS | Status: AC
  Administered 2020-08-14: 4 mg via INTRAVENOUS

## 2020-08-14 NOTE — Patient Instructions (Signed)
Zoledronic Acid injection (Hypercalcemia, Oncology) What is this medicine? ZOLEDRONIC ACID (ZOE le dron ik AS id) lowers the amount of calcium loss from bone. It is used to treat too much calcium in your blood from cancer. It is also used to prevent complications of cancer that has spread to the bone. This medicine may be used for other purposes; ask your health care provider or pharmacist if you have questions. COMMON BRAND NAME(S): Zometa What should I tell my health care provider before I take this medicine? They need to know if you have any of these conditions:  aspirin-sensitive asthma  cancer, especially if you are receiving medicines used to treat cancer  dental disease or wear dentures  infection  kidney disease  receiving corticosteroids like dexamethasone or prednisone  an unusual or allergic reaction to zoledronic acid, other medicines, foods, dyes, or preservatives  pregnant or trying to get pregnant  breast-feeding How should I use this medicine? This medicine is for infusion into a vein. It is given by a health care professional in a hospital or clinic setting. Talk to your pediatrician regarding the use of this medicine in children. Special care may be needed. Overdosage: If you think you have taken too much of this medicine contact a poison control center or emergency room at once. NOTE: This medicine is only for you. Do not share this medicine with others. What if I miss a dose? It is important not to miss your dose. Call your doctor or health care professional if you are unable to keep an appointment. What may interact with this medicine?  certain antibiotics given by injection  NSAIDs, medicines for pain and inflammation, like ibuprofen or naproxen  some diuretics like bumetanide, furosemide  teriparatide  thalidomide This list may not describe all possible interactions. Give your health care provider a list of all the medicines, herbs, non-prescription  drugs, or dietary supplements you use. Also tell them if you smoke, drink alcohol, or use illegal drugs. Some items may interact with your medicine. What should I watch for while using this medicine? Visit your doctor or health care professional for regular checkups. It may be some time before you see the benefit from this medicine. Do not stop taking your medicine unless your doctor tells you to. Your doctor may order blood tests or other tests to see how you are doing. Women should inform their doctor if they wish to become pregnant or think they might be pregnant. There is a potential for serious side effects to an unborn child. Talk to your health care professional or pharmacist for more information. You should make sure that you get enough calcium and vitamin D while you are taking this medicine. Discuss the foods you eat and the vitamins you take with your health care professional. Some people who take this medicine have severe bone, joint, and/or muscle pain. This medicine may also increase your risk for jaw problems or a broken thigh bone. Tell your doctor right away if you have severe pain in your jaw, bones, joints, or muscles. Tell your doctor if you have any pain that does not go away or that gets worse. Tell your dentist and dental surgeon that you are taking this medicine. You should not have major dental surgery while on this medicine. See your dentist to have a dental exam and fix any dental problems before starting this medicine. Take good care of your teeth while on this medicine. Make sure you see your dentist for regular follow-up   appointments. What side effects may I notice from receiving this medicine? Side effects that you should report to your doctor or health care professional as soon as possible:  allergic reactions like skin rash, itching or hives, swelling of the face, lips, or tongue  anxiety, confusion, or depression  breathing problems  changes in vision  eye  pain  feeling faint or lightheaded, falls  jaw pain, especially after dental work  mouth sores  muscle cramps, stiffness, or weakness  redness, blistering, peeling or loosening of the skin, including inside the mouth  trouble passing urine or change in the amount of urine Side effects that usually do not require medical attention (report to your doctor or health care professional if they continue or are bothersome):  bone, joint, or muscle pain  constipation  diarrhea  fever  hair loss  irritation at site where injected  loss of appetite  nausea, vomiting  stomach upset  trouble sleeping  trouble swallowing  weak or tired This list may not describe all possible side effects. Call your doctor for medical advice about side effects. You may report side effects to FDA at 1-800-FDA-1088. Where should I keep my medicine? This drug is given in a hospital or clinic and will not be stored at home. NOTE: This sheet is a summary. It may not cover all possible information. If you have questions about this medicine, talk to your doctor, pharmacist, or health care provider.  2020 [employer] (2014-02-17 14:19:39)  

## 2020-08-15 MED FILL — NINLARO 3 MG CAPS: 3 | 28 days supply | Qty: 3 | Fill #2

## 2020-08-16 DIAGNOSIS — H401132 Primary open-angle glaucoma, bilateral, moderate stage: Secondary | ICD-10-CM | POA: Diagnosis not present

## 2020-08-19 ENCOUNTER — Encounter: Payer: Self-pay | Admitting: Internal Medicine

## 2020-08-21 DIAGNOSIS — C73 Malignant neoplasm of thyroid gland: Secondary | ICD-10-CM | POA: Diagnosis not present

## 2020-08-23 ENCOUNTER — Other Ambulatory Visit: Payer: Self-pay | Admitting: *Deleted

## 2020-08-23 DIAGNOSIS — C9000 Multiple myeloma not having achieved remission: Secondary | ICD-10-CM

## 2020-08-23 MED ORDER — FENTANYL 12 MCG/HR TD PT72: 1.0000 | MEDICATED_PATCH | TRANSDERMAL | 0 refills | Status: DC

## 2020-08-23 NOTE — Telephone Encounter (Signed)
Called and requested refill of Fentanyl patch

## 2020-09-05 ENCOUNTER — Ambulatory Visit: Payer: Medicare HMO | Admitting: Internal Medicine

## 2020-09-05 ENCOUNTER — Other Ambulatory Visit: Payer: Self-pay

## 2020-09-05 ENCOUNTER — Encounter: Payer: Self-pay | Admitting: Internal Medicine

## 2020-09-05 VITALS — BP 148/100 | HR 74 | Ht 62.0 in | Wt 108.4 lb

## 2020-09-05 DIAGNOSIS — E041 Nontoxic single thyroid nodule: Secondary | ICD-10-CM

## 2020-09-05 DIAGNOSIS — R7303 Prediabetes: Secondary | ICD-10-CM | POA: Diagnosis not present

## 2020-09-05 DIAGNOSIS — C73 Malignant neoplasm of thyroid gland: Secondary | ICD-10-CM

## 2020-09-05 DIAGNOSIS — E890 Postprocedural hypothyroidism: Secondary | ICD-10-CM | POA: Diagnosis not present

## 2020-09-05 LAB — T4, FREE: Free T4: 1.04 ng/dL (ref 0.60–1.60)

## 2020-09-05 LAB — TSH: TSH: 0.56 u[IU]/mL (ref 0.35–4.50)

## 2020-09-05 NOTE — Patient Instructions (Addendum)
Please continue Levothyroxine 88 mcg daily.  Take the thyroid hormone every day, with water, at least 30 minutes before breakfast, separated by at least 4 hours from: - acid reflux medications - calcium - iron - multivitamins  Please stop at the lab.  Look up [email or web address].  Please return in 3-4 months.

## 2020-09-05 NOTE — Progress Notes (Signed)
Patient ID: Megan Olson, female   DOB: [birth year], 79 y.o.   MRN: [id number]   This visit occurred during the SARS-CoV-2 public health emergency.  Safety protocols were in place, including screening questions prior to the visit, additional usage of staff PPE, and extensive cleaning of exam room while observing appropriate contact time as indicated for disinfecting solutions.   HPI  Megan Olson is a 79 y.o.-year-old very nice female, initially referred by her PCP, Dr. Yong Channel, returning for follow-up for newly diagnosed thyroid cancer and prediabetes.  Last visit 2.5 months ago.  Papillary thyroid cancer:  Reviewed and addended history: Patient was noticed to have a FDG-avid thyroid nodule on recent PET/CT scans obtained for patient's multiple myeloma.  At her last visit, I ordered a thyroid ultrasound that showed 2 nodules meeting criteria for biopsy.  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  Patient had total thyroidectomy by Dr. Harlow Asa 1 month ago.  PET/CT (07/17/2019): CHEST: Persistent hypermetabolism in a partially calcified 1.8 cm left thyroid nodule, SUV max 4.5. No hypermetabolic mediastinal, hilar or axillary lymph nodes.  PET/CT (09/14/2019): HEAD/NECK: Calcified 1.5 cm in diameter left thyroid nodule maximum SUV 6.7, previously 7.7.  Thyroid U/S (06/24/2020): Parenchymal Echotexture: Mildly heterogenous Isthmus: Normal in size measuring 0.3 cm in diameter Right lobe: Atrophic in size measuring 3.7 x 1.3 x 1.5 cm Left lobe: Atrophic in size measuring 3.4 x 1.6 x 1.8 cm _________________________________________________________  Estimated total number of nodules >/= 1 cm: 2  Number of spongiform nodules >/=  2 cm not described below (TR1): 0  Number of mixed cystic and solid nodules >/= 1.5 cm not described below (TR2): 0 _________________________________________________________  Scattered punctate (sub 5 mm) anechoic cysts and hypoechoic nodules within  the right lobe of the thyroid, several which contain internal echogenic foci with ring down artifact compatible with benign colloid. None of these punctate nodules meet imaging criteria to recommend percutaneous sampling or continued dedicated follow-up. ________________________________________________________  Nodule # 1: Location: Left; Mid - this nodule appears hypermetabolic on preceding PET-CT Maximum size: 2.0 cm; Other 2 dimensions: 1.8 x 1.7 cm Composition: cannot determine (2) Echogenicity: cannot determine (1) Shape: not taller-than-wide (0) Margins: lobulated/irregular (2) Echogenic foci: peripheral calcifications (2) Additional echogenic foci 1:  macrocalcifications (1)  **Given size (>/= 1.0 cm) and appearance, fine needle aspiration of this highly suspicious nodule should be considered based on TI-RADS criteria. _________________________________________________________  Nodule # 2: Location: Left; mid - this nodule appears hypermetabolic on preceding PET-CT Maximum size: 1.2 cm; Other 2 dimensions: 0.9 x 0.7 cm Composition: solid/almost completely solid (2) Echogenicity: hypoechoic (2) Shape: not taller-than-wide (0) Margins: lobulated/irregular (2) Echogenic foci: none (0) *Given size (>/= 1 - 1.4 cm) and appearance, a follow-up ultrasound in 1 year should be considered based on TI-RADS criteria. However - see below: Bx recommended. _________________________________________________________  There is an additional punctate (approximately 0.5 cm) anechoic cyst within left lobe of the thyroid which does not meet criteria to recommend percutaneous sampling or continued dedicated follow-up.  IMPRESSION: 1. Findings suggestive of multinodular goiter. 2. Nodule #1,correlating with one of hypermetabolic nodules seen on preceding PET-CT, meets imaging criteria to recommend percutaneous sampling as clinically indicated. 3. Nodule #2 meets TIRADS criteria to  only recommend a 1 year follow-up, though as this nodule also appears hypermetabolic on preceding PET-CT, percutaneous sampling is also recommended for this nodule as hypermetabolic nodules have a higher rate of harboring malignancy. 4. None of the remaining thyroid nodules/cysts  meet imaging criteria to recommend percutaneous sampling or continued dedicated follow-up.  Will recommend biopsy of the above 2 nodules.  FNAs (07/10/2020): Specimen Submitted: A. 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 THYROID,LEFT MID #1, FINE NEEDLE ASPIRATION:  FINAL MICROSCOPIC DIAGNOSIS:  - Findings consistent with papillary carcinoma (Bethesda category VI)  SPECIMEN ADEQUACY:  Satisfactory for evaluation  Total thyroidectomy (08/02/2020): A. THYROID, TOTAL THYROIDECTOMY:  - Papillary thyroid carcinoma, classical variant, at least 3 cm  - Portion of the tumor shows hyaline fibrosis and calcification  - Anterior surface resection margin is involved by tumor  - Definite evidence of extrathyroidal extension is not identified  - Negative for lymphovascular invasion   THYROID GLAND:   Procedure: Total thyroidectomy  Tumor Focality: Unifocal  Tumor Site: Mid and inferior pole of left lobe  Tumor Size: At least 3 cm  Histologic Type: Papillary thyroid carcinoma, classical variant  Margins: The anterior surface margin is involved by carcinoma  Angioinvasion: Not identified  Lymphatic Invasion: Not identified  Extrathyroidal extension: Not identified  Regional Lymph Nodes: No lymph nodes submitted or found  Pathologic Stage Classification (pTNM, AJCC 8th Edition): pT2, pN not  assigned (no nodes submitted or found)  Comment(s): Though 2 separate lesions were described grossly, they  appeared to be part of a single tumor  with prominent fibrosis and calcification of a portion of the tumor.    Pt denies: - feeling nodules in neck - hoarseness - dysphagia - choking - SOB with lying down  She was started on levothyroxine 88 mcg daily: - in am - fasting - at least 1h  from b'fast - now off calcium - prev. 1000 mg with L and D, now off - no iron - + multivitamins at L - no PPIs - not on Biotin  Reviewed her TFTs: Lab Results  Component Value Date   TSH 1.006 09/19/2019   TSH 1.84 03/25/2016   TSH 1.47 03/22/2015   TSH 1.58 03/19/2014   TSH 1.79 03/08/2013   TSH 1.90 03/07/2012   TSH 2.04 02/17/2011   TSH 1.68 02/03/2010   TSH 2.15 09/05/2008   TSH 1.34 08/05/2007   FREET4 1.10 09/19/2019    She has family history of thyroid disease: Hashimoto's thyroidism in sister. No FH of thyroid cancer. No h/o radiation tx to head or neck.  No herbal supplements. No Biotin use. No recent steroids use.   Prediabetes:  Reviewed HbA1c levels: Lab Results  Component Value Date   HGBA1C 5.9 (A) 06/18/2020   HGBA1C 5.8 (H) 01/26/2020   HGBA1C 5.6 10/10/2018   HGBA1C 6.3 04/04/2018   HGBA1C 5.9 09/17/2017   HGBA1C 6.2 03/26/2017   HGBA1C 5.9 03/22/2015   She does not check blood sugars at home.  + HL: Lab Results  Component Value Date   CHOL 220 (H) 01/26/2020   HDL 68 01/26/2020   LDLCALC 132 (H) 01/26/2020   TRIG 97 01/26/2020   CHOLHDL 3.2 01/26/2020  She is not on a statin  Kidney function was normal: Lab Results  Component Value Date   BUN 14 08/07/2020   Lab Results  Component Value Date   CREATININE 0.69 08/07/2020  She is not on ACE inhibitor or ARB.  She takes an aspirin 81 mg daily.  She has a history of multiple myeloma, which is now in remission.  She  is on chemotherapy.  Previously on dexamethasone with chemotherapy, not now. Also, history of multilevel thoracic vertebral fractures.  ROS: Constitutional: no weight gain/+ weight loss after her multiple myeloma  diagnosis, no fatigue, no subjective hyperthermia, no subjective hypothermia Eyes: no blurry vision, no xerophthalmia ENT: no sore throat, + see HPI Cardiovascular: no CP/no SOB/no palpitations/+ occasional leg swelling Respiratory: no cough/no SOB/no wheezing Gastrointestinal: no N/no V/+ D - in last 2 days/no C/no acid reflux Musculoskeletal: no muscle aches/no joint aches Skin: no rashes, no hair loss Neurological: no tremors/no numbness/no tingling/no dizziness  I reviewed pt's medications, allergies, PMH, social hx, family hx, and changes were documented in the history of present illness. Otherwise, unchanged from my initial visit note.  Past Medical History:  Diagnosis Date  . Anemia   . Cancer ([place])    multiple myeloma, thyroid cancer   . Glaucoma   . HYPERTENSION 03/11/2007  . Multiple myeloma ([place])   . OSTEOPENIA 03/11/2007  . Pre-diabetes    Past Surgical History:  Procedure Laterality Date  . BONE MARROW BIOPSY    . BREAST EXCISIONAL BIOPSY Right 2000  . BREAST LUMPECTOMY  1990   benign  . CATARACT EXTRACTION Bilateral 2018  . DILATION AND CURETTAGE OF UTERUS     bleeding at menopause. No uterine cancer  . IR RADIOLOGIST EVAL & MGMT  12/13/2018  . THYROIDECTOMY N/A 08/02/2020   Procedure: TOTAL THYROIDECTOMY;  Surgeon: [name], MD;  Location: [facility];  Service: General;  Laterality: N/A;  . TONSILLECTOMY     age 45   Social History   Socioeconomic History  . Marital status: Married    Spouse name: Not on file  . Number of children: 1  . Years of education: Not on file  . Highest education level: Not on file  Occupational History  . Occupation: Retired  Tobacco Use  . Smoking status: Former Smoker    Packs/day: 0.50    Years: 7.00    Pack years: 3.50    Types: Cigarettes    Quit date: 01/04/1961    Years since quitting: 59.7  . Smokeless tobacco: Never Used  Vaping Use  . Vaping Use: Never used  Substance and Sexual Activity  . Alcohol use: Yes     Alcohol/week: 1.0 standard drink    Types: 1 Standard drinks or equivalent per week    Comment: occas   . Drug use: No  . Sexual activity: Not Currently  Other Topics Concern  . Not on file  Social History Narrative   Married. Lives with husband (patient of Dr. Yong Channel). 1 son. No grandkids. 1 granddog.       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 Difficulty of Paying Living Expenses: Not on file  Food Insecurity:   . Worried About Charity fundraiser in the Last Year: Not on file  . Ran Out of Food in the Last Year: Not on file  Transportation Needs:   . Lack of Transportation (Medical): Not on file  . Lack of Transportation (Non-Medical): Not on file  Physical Activity:   . Days of Exercise per Week: Not on file  . Minutes of Exercise per Session: Not on file  Stress:   . Feeling of Stress : Not on file  Social  Connections:   . Frequency of Communication with Friends and Family: Not on file  . Frequency of Social Gatherings with Friends and Family: Not on file  . Attends Religious Services: Not on file  . Active Member of Clubs or Organizations: Not on file  . Attends Archivist Meetings: Not on file  . Marital Status: Not on file  Intimate Partner Violence:   . Fear of Current or Ex-Partner: Not on file  . Emotionally Abused: Not on file  . Physically Abused: Not on file  . Sexually Abused: Not on file   Current Outpatient Medications on File Prior to Visit  Medication Sig Dispense Refill  . acyclovir (ZOVIRAX) 400 MG tablet Take 1 tablet (400 mg total) by mouth 2 (two) times daily. 60 tablet 3  . ALPHAGAN P 0.1 % SOLN Place 1 drop into both eyes in the morning, at noon, and at bedtime.     Marland Kitchen amLODipine (NORVASC) 5 MG tablet Take 1 tablet (5 mg total) by mouth daily. (Patient  taking differently: Take 5 mg by mouth at bedtime. ) 90 tablet 3  . aspirin EC 81 MG tablet Take 81 mg by mouth daily.    . bimatoprost (LUMIGAN) 0.03 % ophthalmic solution Place 1 drop into both eyes at bedtime.     . calcium carbonate (TUMS) 500 MG chewable tablet Chew 2 tablets (400 mg of elemental calcium total) by mouth 2 (two) times daily. 90 tablet 1  . Cholecalciferol (VITAMIN D3) 125 MCG (5000 UT) TABS Take 5,000 Units by mouth daily.     Marland Kitchen Co-Enzyme Q-10 100 MG CAPS Take 100 mg by mouth daily.     . dorzolamide-timolol (COSOPT) 22.3-6.8 MG/ML ophthalmic solution Place 1 drop into both eyes 2 (two) times daily.   11  . fentaNYL (DURAGESIC) 12 MCG/HR Place 1 patch onto the skin every 3 (three) days. 10 patch 0  . hydrOXYzine (ATARAX/VISTARIL) 25 MG tablet Take 1 tablet (25 mg total) by mouth 3 (three) times daily as needed. (Patient not taking: Reported on 07/29/2020) 30 tablet 0  . levothyroxine (SYNTHROID) 88 MCG tablet Take 1 tablet (88 mcg total) by mouth daily before breakfast. 30 tablet 3  . magnesium hydroxide (MILK OF MAGNESIA) 400 MG/5ML suspension Take 15 mLs by mouth daily as needed for mild constipation or moderate constipation. (Patient not taking: Reported on 07/29/2020) 355 mL 0  . Multiple Vitamins-Minerals (MULTIVITAMIN WITH MINERALS) tablet Take 1 tablet by mouth daily. Centrum Silver 50 +    . NINLARO 3 MG capsule TAKE 1 CAP (3 MG) BY MOUTH WEEKLY, 3 WEEKS ON, 1 WEEK OFF, REPEAT EVERY 4 WEEKS. TAKE ON AN EMPTY STOMACH 1HR BEFORE OR 2HR AFTER MEAL (Patient taking differently: Take 3 mg by mouth once a week. ) 3 capsule 2  . Omega-3 1000 MG CAPS Take 1,000 mg by mouth daily.     Marland Kitchen oxyCODONE (OXY IR/ROXICODONE) 5 MG immediate release tablet Take 1-2 tablets (5-10 mg total) by mouth every 6 (six) hours as needed for moderate pain. (Patient not taking: Reported on 08/14/2020) 15 tablet 0  . oxyCODONE-acetaminophen (PERCOCET) 5-325 MG tablet Take 1-2 tablets by mouth every 4  (four) hours as needed for moderate pain or severe pain. 90 tablet 0  . polyethylene glycol (MIRALAX) packet Take 17 g by mouth daily. 30 each 1  . senna-docusate (SENOKOT-S) 8.6-50 MG tablet Take 2 tablets by mouth at bedtime. (Patient taking differently: Take 1 tablet by mouth at bedtime. )  60 tablet 2  . simethicone (MYLICON) 80 MG chewable tablet Chew 80 mg by mouth every evening. (Patient not taking: Reported on 08/14/2020)    . vitamin C (ASCORBIC ACID) 500 MG tablet Take 500 mg by mouth 2 (two) times a week.     . [DISCONTINUED] prochlorperazine (COMPAZINE) 10 MG tablet Take 1 tablet (10 mg total) by mouth every 6 (six) hours as needed (Nausea or vomiting). 30 tablet 1   No current facility-administered medications on file prior to visit.   Allergies  Allergen Reactions  . Ace Inhibitors Cough  . Benadryl [Diphenhydramine] Other (See Comments)    Heart races  . Diamox [Acetazolamide]     Hypotensive event at ophthalmologist  . Sulfamethoxazole     REACTION: rash  . Lenalidomide Rash  . Penicillins Rash    REACTION: rash Did it involve swelling of the face/tongue/throat, SOB, or low BP? Yes Did it involve sudden or severe rash/hives, skin peeling, or any reaction on the inside of your mouth or nose? No Did you need to seek medical attention at a hospital or doctor's office? No When did it last happen?more than 10 years ago If all above answers are "NO", may proceed with cephalosporin use.    Family History  Problem Relation Age of Onset  . Heart disease Mother        CHF mother died 67  . Arthritis Mother   . Glaucoma Mother        sister as well  . Alcohol abuse Father   . Suicidality Father   . Cancer Sister        lung cancer, smoker  . Heart disease Sister        aortic valve replacement  . Hyperlipidemia Brother   . Hypertension Brother   . COPD Brother   . Arthritis Sister   . Hypertension Sister   . Glaucoma Sister   . Hashimoto's thyroiditis Sister    . Hypertension Son   . Stroke Maternal Grandmother   . Colon cancer Neg Hx   . Colon polyps Neg Hx     PE: BP (!) 148/100   Pulse 74   Ht _0  (1.575 m)   Wt 108 lb 6.4 oz (49.2 kg)   SpO2 96%   BMI 19.83 kg/m  Wt Readings from Last 3 Encounters:  09/05/20 108 lb 6.4 oz (49.2 kg)  08/14/20 110 lb 6.4 oz (50.1 kg)  08/02/20 110 lb (49.9 kg)   Constitutional: thin, in NAD Eyes: PERRLA, EOMI, no exophthalmos ENT: moist mucous membranes, no neck masses, thyroidectomy scar healing, no cervical lymphadenopathy Cardiovascular: RRR, No MRG Respiratory: CTA B Gastrointestinal: abdomen soft, NT, ND, BS+ Musculoskeletal: no deformities, strength intact in all 4 Skin: moist, warm, no rashes Neurological: no tremor with outstretched hands, DTR normal in all 4  ASSESSMENT: 1.  Papillary thyroid cancer, classic variant  2.  Postsurgical hypothyroidism  3.  Prediabetes  PLAN: 1. Thyroid cancer - papillary - I had a long discussion with the patient about her recent diagnosis of thyroid cancer. We reviewed together the pathology >> she is stage 1 TNM, with lower clinical risk due to tumor size lower than 4 cm, no lymphovascular invasion, no atypical features (except for some calcifications and hyalinization -possibly associated with slightly higher risk), and lack of extrathyroidal extension. - I reassured her that papillary thyroid cancer is a slow growing cancer with good prognosis; her life expectancy or quality of life is unlikely to be reduced  due to the cancer.  -Due to the low-intermediate risk of the tumor, radioactive iodine for post-op thyroid remnant ablation is not mandatory. I explained that the main role of this is to facilitate monitoring in the long run (by checking thyroglobulin).  However, she is undergoing chemotherapy for multiple myeloma and for now, it would be better to hold off RAI treatment.  We did discuss that if we proceed with RAI treatment after she finishes  chemotherapy, her prognosis will not be changed.  We can revisit the decision for RAI treatment next year.  She agrees with the plan. - I would still like to check a thyroglobulin and ATA antibodies and follow the trends, even though her thyroglobulin cannot be undetectable in the absence of RAI treatment. - I will see the patient in 3 to 4 months.  2.  Patient with h/o total thyroidectomy for cancer, now with iatrogenic hypothyroidism, on levothyroxine therapy.  She is on 88 mcg daily, started after the surgery. - Latest TSH was normal but this was obtained before her surgery. - she appears euthyroid.  She had diarrhea for the last 2 days she was wondering whether this could be related levothyroxine and I advised her that this would be very unlikely unless we are over replacing her, which I doubt - We discussed about correct intake of levothyroxine, fasting, with water, separated by at least 30 minutes from breakfast, and separated by more than 4 hours from calcium, iron, multivitamins, acid reflux medications (PPIs).  Pt. is taking it correctly. - will check thyroid tests today: TSH, free T4 - target TSH: In the lower part of the normal interval - If these are abnormal, she will need to return in 5-6 weeks for repeat labs - If these are normal, we will recheck them at next visit  2.  Prediabetes -Very mild, latest HbA1c was 5.9% -We discussed about the reversibility of the disease at last visit -She continues to adjust and improve her diet. -We will recheck her HbA1c at next visit. -We can continue to follow her without medications for now.   Component     Latest Ref Rng & Units 09/05/2020  Thyroglobulin     ng/mL 0.4 (L)  Comment        TSH     0.35 - 4.50 uIU/mL 0.56  T4,Free(Direct)     0.60 - 1.60 ng/dL 1.04  Thyroglobulin Ab     < or = 1 IU/mL <1   TFTs are at goal.  Thyroglobulin is low, while ATA antibodies are undetectable.  We will continue the same dose of  levothyroxine.  [name], MD PhD [facility]

## 2020-09-06 LAB — THYROGLOBULIN LEVEL: Thyroglobulin: 0.4 ng/mL — ABNORMAL LOW

## 2020-09-06 LAB — THYROGLOBULIN ANTIBODY: Thyroglobulin Ab: <1 IU/mL (ref <=1)

## 2020-09-09 ENCOUNTER — Other Ambulatory Visit: Payer: Self-pay | Admitting: Hematology

## 2020-09-10 ENCOUNTER — Telehealth: Payer: Self-pay | Admitting: *Deleted

## 2020-09-10 NOTE — Telephone Encounter (Signed)
Charle states she had diarrhea 2 days ago. Has been eating bland diet last 2 days and has not had a BM. She is still using Miralax and has cut senakot dose in half. Will increase variety in diet today and see how her bowels react.   Will call later in the week if she develops diarrhea again

## 2020-09-12 MED FILL — NINLARO 3 MG CAPS: 3 | 28 days supply | Qty: 3 | Fill #0

## 2020-09-13 ENCOUNTER — Telehealth: Payer: Self-pay | Admitting: *Deleted

## 2020-09-13 ENCOUNTER — Other Ambulatory Visit: Payer: Self-pay | Admitting: Internal Medicine

## 2020-09-13 MED ORDER — LEVOTHYROXINE SODIUM 75 MCG PO TABS: 75.0000 ug | ORAL_TABLET | Freq: Every day | ORAL | 3 refills | Status: DC

## 2020-09-13 NOTE — Telephone Encounter (Signed)
Patient called with update. Diarrhea has decreased - none for 2 days, but was still concerned. She contacted Dr. Cruzita Lederer to ask if levothyroxine could be a factor in diarrhea. She said Dr. Cruzita Lederer decreased the dose of the medication and she hoped that would stop the diarrhea.

## 2020-09-26 ENCOUNTER — Other Ambulatory Visit: Payer: Self-pay | Admitting: Hematology

## 2020-09-26 DIAGNOSIS — C9000 Multiple myeloma not having achieved remission: Secondary | ICD-10-CM

## 2020-09-26 MED ORDER — FENTANYL 12 MCG/HR TD PT72: 1.0000 | MEDICATED_PATCH | TRANSDERMAL | 0 refills | Status: DC

## 2020-09-26 MED ORDER — OXYCODONE-ACETAMINOPHEN 5-325 MG PO TABS: 1.0000 | ORAL_TABLET | ORAL | 0 refills | Status: DC | PRN

## 2020-09-26 NOTE — Progress Notes (Signed)
Percocet and fentanyl refilled

## 2020-10-02 ENCOUNTER — Inpatient Hospital Stay: Payer: Medicare HMO | Attending: Hematology

## 2020-10-02 ENCOUNTER — Other Ambulatory Visit: Payer: Self-pay

## 2020-10-02 DIAGNOSIS — C9000 Multiple myeloma not having achieved remission: Secondary | ICD-10-CM

## 2020-10-02 LAB — CBC WITH DIFFERENTIAL/PLATELET
Abs Immature Granulocytes: 0.01 10*3/uL (ref 0.00–0.07)
Basophils Absolute: 0 10*3/uL (ref 0.0–0.1)
Basophils Relative: 1 %
Eosinophils Absolute: 0.1 10*3/uL (ref 0.0–0.5)
Eosinophils Relative: 2 %
HCT: 36.8 % (ref 36.0–46.0)
Hemoglobin: 12 g/dL (ref 12.0–15.0)
Immature Granulocytes: 0 %
Lymphocytes Relative: 14 %
Lymphs Abs: 0.6 10*3/uL — ABNORMAL LOW (ref 0.7–4.0)
MCH: 32.8 pg (ref 26.0–34.0)
MCHC: 32.6 g/dL (ref 30.0–36.0)
MCV: 100.5 fL — ABNORMAL HIGH (ref 80.0–100.0)
Monocytes Absolute: 0.4 10*3/uL (ref 0.1–1.0)
Monocytes Relative: 11 %
Neutro Abs: 2.8 10*3/uL (ref 1.7–7.7)
Neutrophils Relative %: 72 %
Platelets: 215 10*3/uL (ref 150–400)
RBC: 3.66 MIL/uL — ABNORMAL LOW (ref 3.87–5.11)
RDW: 14.1 % (ref 11.5–15.5)
WBC: 3.9 10*3/uL — ABNORMAL LOW (ref 4.0–10.5)
nRBC: 0 % (ref 0.0–0.2)

## 2020-10-02 LAB — CMP (CANCER CENTER ONLY)
ALT: 16 U/L (ref 0–44)
AST: 21 U/L (ref 15–41)
Albumin: 4.2 g/dL (ref 3.5–5.0)
Alkaline Phosphatase: 30 U/L — ABNORMAL LOW (ref 38–126)
Anion gap: 6 (ref 5–15)
BUN: 11 mg/dL (ref 8–23)
CO2: 29 mmol/L (ref 22–32)
Calcium: 9.3 mg/dL (ref 8.9–10.3)
Chloride: 107 mmol/L (ref 98–111)
Creatinine: 0.7 mg/dL (ref 0.44–1.00)
GFR, Estimated: >60 mL/min (ref >60)
Glucose, Bld: 100 mg/dL — ABNORMAL HIGH (ref 70–99)
Potassium: 4.3 mmol/L (ref 3.5–5.1)
Sodium: 142 mmol/L (ref 135–145)
Total Bilirubin: 0.4 mg/dL (ref 0.3–1.2)
Total Protein: 7 g/dL (ref 6.5–8.1)

## 2020-10-03 LAB — KAPPA/LAMBDA LIGHT CHAINS
Kappa free light chain: 10.3 mg/L (ref 3.3–19.4)
Kappa, lambda light chain ratio: 0.49 (ref 0.26–1.65)
Lambda free light chains: 21.2 mg/L (ref 5.7–26.3)

## 2020-10-05 LAB — MULTIPLE MYELOMA PANEL, SERUM
Albumin SerPl Elph-Mcnc: 4.3 g/dL (ref 2.9–4.4)
Albumin/Glob SerPl: 1.9 — ABNORMAL HIGH (ref 0.7–1.7)
Alpha 1: 0.2 g/dL (ref 0.0–0.4)
Alpha2 Glob SerPl Elph-Mcnc: 0.7 g/dL (ref 0.4–1.0)
B-Globulin SerPl Elph-Mcnc: 1 g/dL (ref 0.7–1.3)
Gamma Glob SerPl Elph-Mcnc: 0.4 g/dL (ref 0.4–1.8)
Globulin, Total: 2.3 g/dL (ref 2.2–3.9)
IgA: 26 mg/dL — ABNORMAL LOW (ref 64–422)
IgG (Immunoglobin G), Serum: 705 mg/dL (ref 586–1602)
IgM (Immunoglobulin M), Srm: 32 mg/dL (ref 26–217)
Total Protein ELP: 6.6 g/dL (ref 6.0–8.5)

## 2020-10-08 MED FILL — NINLARO 3 MG CAPS: 3 | 28 days supply | Qty: 3 | Fill #1

## 2020-10-09 ENCOUNTER — Other Ambulatory Visit: Payer: Self-pay

## 2020-10-09 ENCOUNTER — Ambulatory Visit: Payer: Medicare HMO

## 2020-10-09 ENCOUNTER — Inpatient Hospital Stay: Payer: Medicare HMO

## 2020-10-09 ENCOUNTER — Inpatient Hospital Stay: Payer: Medicare HMO | Attending: Hematology | Admitting: Hematology

## 2020-10-09 VITALS — BP 185/77 | HR 68 | Temp 98.0°F | Resp 18 | Ht 62.0 in | Wt 107.5 lb

## 2020-10-09 DIAGNOSIS — Z87891 Personal history of nicotine dependence: Secondary | ICD-10-CM | POA: Diagnosis not present

## 2020-10-09 DIAGNOSIS — Z5111 Encounter for antineoplastic chemotherapy: Secondary | ICD-10-CM

## 2020-10-09 DIAGNOSIS — M8580 Other specified disorders of bone density and structure, unspecified site: Secondary | ICD-10-CM | POA: Insufficient documentation

## 2020-10-09 DIAGNOSIS — K5900 Constipation, unspecified: Secondary | ICD-10-CM | POA: Diagnosis not present

## 2020-10-09 DIAGNOSIS — Z79899 Other long term (current) drug therapy: Secondary | ICD-10-CM | POA: Diagnosis not present

## 2020-10-09 DIAGNOSIS — Z7189 Other specified counseling: Secondary | ICD-10-CM

## 2020-10-09 DIAGNOSIS — C9000 Multiple myeloma not having achieved remission: Secondary | ICD-10-CM

## 2020-10-09 DIAGNOSIS — I10 Essential (primary) hypertension: Secondary | ICD-10-CM | POA: Diagnosis not present

## 2020-10-09 MED ORDER — SODIUM CHLORIDE 0.9 % IV SOLN
Freq: Once | INTRAVENOUS | Status: AC
  Filled 2020-10-09: qty 250

## 2020-10-09 MED ORDER — ZOLEDRONIC ACID 4 MG/100ML IV SOLN
4.0000 mg | Freq: Once | INTRAVENOUS | Status: AC
  Administered 2020-10-09: 4 mg via INTRAVENOUS

## 2020-10-09 MED ORDER — ZOLEDRONIC ACID 4 MG/100ML IV SOLN
INTRAVENOUS | Status: AC
  Filled 2020-10-09: qty 100

## 2020-10-09 NOTE — Progress Notes (Signed)
Per Dr. Candise Che: okay to treat patient using labs from 10/02/2020.

## 2020-10-09 NOTE — Progress Notes (Signed)
HEMATOLOGY/ONCOLOGY CLINIC NOTE  Date of Service: 10/09/2020  Patient Care Team: [name], MD as PCP - General (Family Medicine) [name], MD as Consulting Physician (Hematology) Bond, [name], MD as Referring Physician (Ophthalmology) [name], [name], MD as Consulting Physician (Hematology and Oncology) [name], MD as Consulting Physician (Internal Medicine) [name], MD as Consulting Physician (General Surgery)  Dr. Ronney Lion as [place] Specialist  [phone]  CHIEF COMPLAINTS/PURPOSE OF CONSULTATION:   Multiple Myeloma- continued management  HISTORY OF PRESENTING ILLNESS:   Megan Olson is a wonderful 80 y.o. female who has been referred to [place] by Dr. 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 She is accompanied today by her husband. The pt reports that she is doing well overall.   The pt notes that she was doing extensive yard work in October 2019, and began to feel back pain a few days after this, which she attributed to muscle pain. She recalls taking a deep breath, and developing sudden acute pain, and presented to care with her PCP's office. She began exercises for her back pain, without relief, then began PT without relief, then was referred to Dr. Paulla Fore in sports medicine in late January 2020. She had an XR which revealed a compression fracture at T11, then subsequently had an MRI, and a bone marrow biopsy. The pt notes that her back pain "seemed to move around." She endorses pain "up the whole left side" of her back.  The pt notes that she is not able to stand up straight due to her back pain, which she feels limits her ability to take a deep breath, and endorses pain exacerbation when she does take a deep breath. The pt needs assistance from sitting to lying from her husband. She is using about 3 Percocet a day.  The pt notes worsened constipation since  beginning Percocet, and notes that her most recent laxative order was not covered by her insurance. She is using prune juice and milk of magnesia. She took Senokot S BID without relief.  The pt reports that prior to her recent back pain, she had very few medical concerns. She endorses controlled BP, and has been monitored for DM but after closely watching her diet her A1C decreased to 5.8. She has glaucoma, and has had surgery in both eyes. Right eye with stent. The pt sees Dr. Edilia Bo at [facility] for her eye care. The pt notes that her vision has been recently "pretty good." The pt notes that she has been advised to limit treatment with steroids for her glaucoma. She denies heart or lung problems. Denies previous back problems. Last DEXA scan 3 years ago, and endorses osteopenia. She notes that she took Fosamax for 3-4 years, and stopped 5-6 years ago. She takes Vitamin D, a multivitamin, and magnesium.  The pt notes that she quit smoking cigarettes when she was 27, started when she was about 20. The pt consumes alcohol rarely, but not since beginning narcotics. 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  Of note prior to the patient's visit today, pt has had an MRI Thoracic Spine completed on 11/27/18 with results revealing Multifocal marrow signal abnormality consistent with metastatic disease or multiple myeloma. The patient has multiple compression fractures most consistent with pathologic injuries. Extensive marrow signal abnormality makes determining age of the fractures difficult but edema is most intense in T3. Epidural tumor centrally and to the left posterior to T3  extends into the left neural foramen and could impact the left T3 root.  Most recent lab results (12/01/18) of CBC w/diff is as follows: all values are WNL except for RBC at 3.17, HGB at 10.4, HCT at 33.5, MCV at 105.7. 11/28/18 CMP revealed all values WNL except for Glucose at 105, Total Protein at 10.2, Albumin at 3.1 11/30/18 24HR  UPEP revealed all values WNL except for M-spike at 75.1%. 11/28/18 Beta-2 microglobulin slightly elevated at 2.6 11/28/18 SPEP revealed M spike at 4.6g 11/28/18 Immunoglobulins revealed IgG at 6181, IgA at 24, IgM at 16, and IgE at 6.  On review of systems, pt reports constipation, back pain, stable energy levels, ankle swelling, tenderness at T3, lower back pain, and denies abdominal pains, neck pain, and any other symptoms.   On PMHx the pt reports redundant colon, single hemorrhoid, glaucoma, HTN, osteopenia, Tonsillectomy, right eye stent and multiple eye surgeries. On Social Hx the pt reports rare alcohol use, smoked cigarettes between ages 77 and 21. 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 On Family Hx the pt reports sister died from small cell lung cancer and was a lifelong smoker, brother's daughter died of breast cancer with BRCA1 and BRCA2 mutations. Cousin with female breast cancer.  INTERVAL HISTORY:   Megan Olson returns today for management and evaluation of her multiple myeloma. The patient's last visit with [place] was on 08/14/2020. The pt reports that she is doing well overall.  The pt reports that she began experiencing diarrhea after starting Synthroid. She is not currently having any abdominal pain or cramping but is still having softer stools. The pt has a follow-up with Dr. Cruzita Lederer to monitor thyroid levels.   Lab results (10/02/2020) of CBC w/diff and CMP is as follows: all values are WNL except for WBC at 3.9K, RBC at 3.66, MCV at 100.5, Lymphs Abs at 0.6K, Glucose at 100, ALP at 30. 10/02/2020 K/L light chains shows all values are WNL. 10/02/2020 MMP shows all values are WNL except for: IgA at 26, Albumin/Glob at 1.9.  On review of systems, pt denies abdominal pain, leg swelling and any other symptoms.   MEDICAL HISTORY:  Past Medical History:  Diagnosis Date  . Anemia   . Cancer ([place])    multiple myeloma, thyroid cancer   . Glaucoma   . HYPERTENSION 03/11/2007  . Multiple  myeloma ([place])   . OSTEOPENIA 03/11/2007  . Pre-diabetes     SURGICAL HISTORY: Past Surgical History:  Procedure Laterality Date  . BONE MARROW BIOPSY    . BREAST EXCISIONAL BIOPSY Right 2000  . BREAST LUMPECTOMY  1990   benign  . CATARACT EXTRACTION Bilateral 2018  . DILATION AND CURETTAGE OF UTERUS     bleeding at menopause. No uterine cancer  . IR RADIOLOGIST EVAL & MGMT  12/13/2018  . THYROIDECTOMY N/A 08/02/2020   Procedure: TOTAL THYROIDECTOMY;  Surgeon: [name], MD;  Location: [facility];  Service: General;  Laterality: N/A;  . TONSILLECTOMY     age 61    SOCIAL HISTORY: Social History   Socioeconomic History  . Marital status: Married    Spouse name: Not on file  . Number of children: 1  . Years of education: Not on file  . Highest education level: Not on file  Occupational History  . Occupation: Retired  Tobacco Use  . Smoking status: Former Smoker    Packs/day: 0.50    Years: 7.00    Pack years: 3.50    Types: Cigarettes  Quit date: 01/04/1961    Years since quitting: 59.8  . Smokeless tobacco: Never Used  Vaping Use  . Vaping Use: Never used  Substance and Sexual Activity  . Alcohol use: Yes    Alcohol/week: 1.0 standard drink    Types: 1 Standard drinks or equivalent per week    Comment: occas   . Drug use: No  . Sexual activity: Not Currently  Other Topics Concern  . Not on file  Social History Narrative   Married. Lives with husband (patient of Dr. Yong Channel). 1 son. No grandkids. 1 granddog.       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 Heart disease Mother        CHF mother died 28  . Arthritis Mother   . Glaucoma Mother        sister as well  . Alcohol abuse Father   . Suicidality Father   . Cancer Sister        lung cancer, smoker  . Heart disease Sister        aortic valve replacement  . Hyperlipidemia Brother   . Hypertension Brother   . COPD Brother   . Arthritis Sister   . Hypertension Sister   . Glaucoma Sister   . Hashimoto's thyroiditis Sister   . Hypertension Son   . Stroke Maternal Grandmother   . Colon cancer Neg Hx   . Colon polyps Neg Hx     ALLERGIES:  is allergic to ace inhibitors, benadryl [diphenhydramine], diamox [acetazolamide], sulfamethoxazole, lenalidomide, and penicillins.   MEDICATIONS:  Current Outpatient Medications  Medication Sig Dispense Refill  . acyclovir (ZOVIRAX) 400 MG tablet Take 1 tablet (400 mg total) by mouth 2 (two) times daily. 60 tablet 3  . ALPHAGAN P 0.1 % SOLN Place 1 drop into both eyes in the morning, at noon, and at bedtime.     Marland Kitchen amLODipine (NORVASC) 5 MG tablet Take 1 tablet (5 mg total) by mouth daily. (Patient taking differently: Take 5 mg by mouth at bedtime. ) 90 tablet 3  . aspirin EC 81 MG tablet Take 81 mg by mouth daily.    . bimatoprost (LUMIGAN) 0.03 % ophthalmic solution Place 1 drop into both eyes at bedtime.     . calcium carbonate (TUMS) 500 MG chewable tablet Chew 2 tablets (400 mg of elemental calcium total) by mouth 2 (two) times daily. 90 tablet 1  . Cholecalciferol (VITAMIN D3) 125 MCG (5000 UT) TABS Take 5,000 Units by mouth daily.     Marland Kitchen Co-Enzyme Q-10 100 MG CAPS Take 100 mg by mouth daily.     . dorzolamide-timolol (COSOPT) 22.3-6.8 MG/ML ophthalmic solution Place 1 drop into both eyes 2 (two) times daily.   11  . fentaNYL (DURAGESIC) 12 MCG/HR Place 1 patch onto the skin every 3 (three) days. 10 patch 0  . hydrOXYzine (ATARAX/VISTARIL) 25 MG tablet Take 1 tablet (25 mg total) by mouth 3  (three) times daily as needed. 30 tablet  0  . levothyroxine (SYNTHROID) 75 MCG tablet Take 1 tablet (75 mcg total) by mouth daily before breakfast. 45 tablet 3  . magnesium hydroxide (MILK OF MAGNESIA) 400 MG/5ML suspension Take 15 mLs by mouth daily as needed for mild constipation or moderate constipation. 355 mL 0  . Multiple Vitamins-Minerals (MULTIVITAMIN WITH MINERALS) tablet Take 1 tablet by mouth daily. Centrum Silver 50 +    . NINLARO 3 MG capsule TAKE 1 CAP (3 MG) BY MOUTH WEEKLY, 3 WEEKS ON, 1 WEEK OFF, REPEAT EVERY 4 WEEKS. TAKE ON AN EMPTY STOMACH 1HR BEFORE OR 2HR AFTER MEAL 3 capsule 2  . Omega-3 1000 MG CAPS Take 1,000 mg by mouth daily.     Marland Kitchen oxyCODONE-acetaminophen (PERCOCET) 5-325 MG tablet Take 1-2 tablets by mouth every 4 (four) hours as needed for moderate pain or severe pain. 90 tablet 0  . polyethylene glycol (MIRALAX) packet Take 17 g by mouth daily. 30 each 1  . senna-docusate (SENOKOT-S) 8.6-50 MG tablet Take 2 tablets by mouth at bedtime. (Patient taking differently: Take 1 tablet by mouth at bedtime. ) 60 tablet 2  . simethicone (MYLICON) 80 MG chewable tablet Chew 80 mg by mouth every evening.     . vitamin C (ASCORBIC ACID) 500 MG tablet Take 500 mg by mouth 2 (two) times a week.      No current facility-administered medications for this visit.    REVIEW OF SYSTEMS:   A 10+ POINT REVIEW OF SYSTEMS WAS OBTAINED including neurology, dermatology, psychiatry, cardiac, respiratory, lymph, extremities, GI, GU, Musculoskeletal, constitutional, breasts, reproductive, HEENT.  All pertinent positives are noted in the HPI.  All others are negative.   PHYSICAL EXAMINATION: ECOG PERFORMANCE STATUS: 1-2  There were no vitals filed for this visit. There were no vitals filed for this visit. There is no height or weight on file to calculate BMI.   Exam was given in a chair   GENERAL:alert, in no acute distress and comfortable SKIN: no acute rashes, no significant  lesions EYES: conjunctiva are pink and non-injected, sclera anicteric OROPHARYNX: MMM, no exudates, no oropharyngeal erythema or ulceration NECK: supple, no JVD LYMPH:  no palpable lymphadenopathy in the cervical, axillary or inguinal regions LUNGS: clear to auscultation b/l with normal respiratory effort HEART: regular rate & rhythm ABDOMEN:  normoactive bowel sounds , non tender, not distended. No palpable hepatosplenomegaly.  Extremity: no pedal edema PSYCH: alert & oriented x 3 with fluent speech NEURO: no focal motor/sensory deficits  LABORATORY DATA:  I have reviewed the data as listed  . CBC Latest Ref Rng & Units 10/02/2020 08/07/2020 08/01/2020  WBC 4.0 - 10.5 K/uL 3.9(L) 4.2 3.5(L)  Hemoglobin 12.0 - 15.0 g/dL 12.0 11.6(L) 12.6  Hematocrit 36.0 - 46.0 % 36.8 36.0 38.4  Platelets 150 - 400 K/uL 215 244 234    . CMP Latest Ref Rng & Units 10/02/2020 08/07/2020 08/03/2020  Glucose 70 - 99 mg/dL 100(H) 100(H) 105(H)  BUN 8 - 23 mg/dL $Remove'11 14 23  'WGgHCXS$ Creatinine 0.44 - 1.00 mg/dL 0.70 0.69 0.77  Sodium 135 - 145 mmol/L 142 141 138  Potassium 3.5 - 5.1 mmol/L 4.3 4.0 3.8  Chloride 98 - 111 mmol/L 107 105 104  CO2 22 - 32 mmol/L $RemoveB'29 30 25  'WQeICpSw$ Calcium 8.9 - 10.3 mg/dL 9.3 9.6 9.8  Total Protein 6.5 - 8.1 g/dL 7.0 6.7 -  Total Bilirubin 0.3 - 1.2 mg/dL 0.4 0.5 -  Alkaline Phos 38 - 126 U/L 30(L) 31(L) -  AST 15 - 41 U/L 21 16 -  ALT 0 - 44 U/L 16 13 -   09/11/2019 Flow Pathology Report [id number]):   09/11/2019 Bone Marrow Report [id number]):    07/14/2019 Cytogenetics [id number]):    07/14/2019 Flow Pathology Report [phone]):   07/14/2019 BM Bx ([id number]):    12/01/18 BM Bx:   12/01/18 Flow Cytometry:       RADIOGRAPHIC STUDIES: I have personally reviewed the radiological images as listed and agreed with the findings in the report. No results found.   ASSESSMENT & PLAN:  79 y.o. female with  1. Multiple Myeloma with IgG Lambda  specificity Labs upon initial presentation from 12/01/18 CBC w/diff revealed HGB at 10.4 with MCV of 105.7. 11/28/18 CMP revealed Creatinine normal at 0.68 and Calcium normal at 8.9. 11/28/18 Beta 2 microglobulin at 2.6mg  (also a reading at 4.7mg  on the same day). 11/30/18 24HR UPEP revealed M spike at 75.1%. 11/28/18 SPEP revealed M spike of 4.6g. 11/28/18 Immunoglobulins revealed IgG elevated at 6181mg .  12/01/18 BM Bx revealed hypercellular bone marrow with 70-80% CD138 immunohistochemistry (44% aspirate) lambda-restricted plasma cells as well as a kappa restricted population of B-cells 11/27/18 MRI Thoracic Spine which revealed Multifocal marrow signal abnormality consistent with metastatic disease or multiple myeloma. The patient has multiple compression fractures most consistent with pathologic injuries. Extensive marrow signal abnormality makes determining age of the fractures difficult but edema is most intense in T3. 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 2. Areas of hypermetabolic disease identified in both lungs suggesting multiple myeloma involvement. 3. 1.9 cm calcified left thyroid nodule is hypermetabolic, but Indeterminate. 4. Colon is diffusely distended with gas and stool. Imaging features would be compatible with clinical constipation. 5.  Aortic Atherosclerois.  Pt describes grade II to III rash on her re-challenge from Revlimid with optimized pre-medications, and we discontinued Revlimid  Began Cytoxan with C4 Velcade and Dexamethasone  05/16/2019 M spike is down to 0.2g/dl showing continued improvement and a 90% reduction  -07/14/2019 BM  Bx revealed BONE MARROW: - Hypercellular marrow with residual plasma cell neoplasm (<10%) PERIPHERAL BLOOD: - Pancytopenia".   07/17/2019 PET/CT Whole Body Scan ([id number]) revealed "1. 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 2. Hypermetabolic calcified left thyroid nodule, as before, indeterminate. 3. Aortic atherosclerosis (ICD10-170.0). Coronary artery calcification."  09/11/2019 Bone Marrow Report ([id number]) revealed "BONE MARROW, ASPIRATE, CLOT, CORE:  -Variably cellular bone marrow with trilineage hematopoiesis and 2% plasma cells PERIPHERAL BLOOD:  -Pancytopenia".  09/11/2019 Flow Pathology Report [id number]) revealed "-Predominance of T lymphocytes with relative abundance of CD8 positive cells  -No significant B-cell population identified -Relative abundance of natural killer cells".  09/14/2019 PET/CT ([id number]) revealed "1. No findings of active malignancy. 2. Numerous thoracolumbar compression fractures many of which may be associated with osteoporosis. 3. Calcified 1.5 cm in diameter left thyroid nodule with persistent elevated activity. A significant minority of hypermetabolic thyroid nodules can be malignant, if not previously worked up to then thyroid ultrasound would be recommended as a next step. 4. There a few small lucent and small sclerotic lesions in the skeleton which are not hypermetabolic, possibilities include benign lesions or  previously effectively treated myeloma. 5. Other imaging findings of potential clinical significance: Aortic Atherosclerosis (ICD10-I70.0). Coronary atherosclerosis. Prominent stool throughout the colon favors constipation. Hyperdense right renal lesion is probably a complex cyst but technically nonspecific."  2. 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 She does not currently have elevated lymphocyte counts on peripheral blood draws.   PLAN: -Discussed pt labwork, 10/02/20; blood counts and chemistries look good, K/L light chains are WNL, MMP shows no M Protein. -No lab or clinical evidence of significant Multiple Myeloma or Monoclonal B-Cell Lymphocytosis progression at this time. -The pt has no prohibitive toxicities from continuing Ninlaro (3 weeks on & 1 week off) at this time. -Recommend pt back off of Miralax if experiencing diarrhea or excessively loose stools.  -Advised pt that oversupplementation of Zinc can cause Copper deficiency.  -Recommend pt continue to f/u with Dr. Cruzita Lederer for thyroid management.  -Continue 12.5 mcg/hr Fentanyl patch & Oxycodone prn for pain management.  -Continue 5000 UT Vitamin D daily. Goal Vitamin D is 60 <>90  -Continue Zometa q8weeks  -Will see back in 2 months, with labs 1 week prior.    FOLLOW UP: RTC with Dr Irene Limbo with next Zometa infusion in 2 months Labs 1 week prior to clinic visit   The total time spent in the appt was 20 minutes and more than 50% was on counseling and direct patient cares.  All of the patient's questions were answered with apparent satisfaction. The patient knows to call the clinic with any problems, questions or concerns.    Sullivan Lone MD [place] AAHIVMS [facility] [place] Hematology/Oncology Physician [facility]  (Office):       [phone] (Work cell):  [phone] (Fax):           [phone]  10/09/2020 7:30 AM  I, Yevette Edwards, am acting as a scribe for Dr. Sullivan Lone.   .I have reviewed the above documentation for accuracy and completeness, and I agree with the above. [name] MD

## 2020-10-09 NOTE — Patient Instructions (Signed)
Zoledronic Acid injection (Hypercalcemia, Oncology) What is this medicine? ZOLEDRONIC ACID (ZOE le dron ik AS id) lowers the amount of calcium loss from bone. It is used to treat too much calcium in your blood from cancer. It is also used to prevent complications of cancer that has spread to the bone. This medicine may be used for other purposes; ask your health care provider or pharmacist if you have questions. COMMON BRAND NAME(S): Zometa What should I tell my health care provider before I take this medicine? They need to know if you have any of these conditions:  aspirin-sensitive asthma  cancer, especially if you are receiving medicines used to treat cancer  dental disease or wear dentures  infection  kidney disease  receiving corticosteroids like dexamethasone or prednisone  an unusual or allergic reaction to zoledronic acid, other medicines, foods, dyes, or preservatives  pregnant or trying to get pregnant  breast-feeding How should I use this medicine? This medicine is for infusion into a vein. It is given by a health care professional in a hospital or clinic setting. Talk to your pediatrician regarding the use of this medicine in children. Special care may be needed. Overdosage: If you think you have taken too much of this medicine contact a poison control center or emergency room at once. NOTE: This medicine is only for you. Do not share this medicine with others. What if I miss a dose? It is important not to miss your dose. Call your doctor or health care professional if you are unable to keep an appointment. What may interact with this medicine?  certain antibiotics given by injection  NSAIDs, medicines for pain and inflammation, like ibuprofen or naproxen  some diuretics like bumetanide, furosemide  teriparatide  thalidomide This list may not describe all possible interactions. Give your health care provider a list of all the medicines, herbs, non-prescription  drugs, or dietary supplements you use. Also tell them if you smoke, drink alcohol, or use illegal drugs. Some items may interact with your medicine. What should I watch for while using this medicine? Visit your doctor or health care professional for regular checkups. It may be some time before you see the benefit from this medicine. Do not stop taking your medicine unless your doctor tells you to. Your doctor may order blood tests or other tests to see how you are doing. Women should inform their doctor if they wish to become pregnant or think they might be pregnant. There is a potential for serious side effects to an unborn child. Talk to your health care professional or pharmacist for more information. You should make sure that you get enough calcium and vitamin D while you are taking this medicine. Discuss the foods you eat and the vitamins you take with your health care professional. Some people who take this medicine have severe bone, joint, and/or muscle pain. This medicine may also increase your risk for jaw problems or a broken thigh bone. Tell your doctor right away if you have severe pain in your jaw, bones, joints, or muscles. Tell your doctor if you have any pain that does not go away or that gets worse. Tell your dentist and dental surgeon that you are taking this medicine. You should not have major dental surgery while on this medicine. See your dentist to have a dental exam and fix any dental problems before starting this medicine. Take good care of your teeth while on this medicine. Make sure you see your dentist for regular follow-up   appointments. What side effects may I notice from receiving this medicine? Side effects that you should report to your doctor or health care professional as soon as possible:  allergic reactions like skin rash, itching or hives, swelling of the face, lips, or tongue  anxiety, confusion, or depression  breathing problems  changes in vision  eye  pain  feeling faint or lightheaded, falls  jaw pain, especially after dental work  mouth sores  muscle cramps, stiffness, or weakness  redness, blistering, peeling or loosening of the skin, including inside the mouth  trouble passing urine or change in the amount of urine Side effects that usually do not require medical attention (report to your doctor or health care professional if they continue or are bothersome):  bone, joint, or muscle pain  constipation  diarrhea  fever  hair loss  irritation at site where injected  loss of appetite  nausea, vomiting  stomach upset  trouble sleeping  trouble swallowing  weak or tired This list may not describe all possible side effects. Call your doctor for medical advice about side effects. You may report side effects to FDA at 1-800-FDA-1088. Where should I keep my medicine? This drug is given in a hospital or clinic and will not be stored at home. NOTE: This sheet is a summary. It may not cover all possible information. If you have questions about this medicine, talk to your doctor, pharmacist, or health care provider.  2020 [employer] (2014-02-17 14:19:39)  

## 2020-10-14 ENCOUNTER — Telehealth: Payer: Self-pay | Admitting: Hematology

## 2020-10-14 NOTE — Telephone Encounter (Signed)
Rescheduled appt per 1/07 sch msg - pt is aware of new appt date and time

## 2020-10-23 ENCOUNTER — Other Ambulatory Visit: Payer: Self-pay | Admitting: *Deleted

## 2020-10-23 DIAGNOSIS — C9000 Multiple myeloma not having achieved remission: Secondary | ICD-10-CM

## 2020-10-23 MED ORDER — FENTANYL 12 MCG/HR TD PT72: 1.0000 | MEDICATED_PATCH | TRANSDERMAL | 0 refills | Status: DC

## 2020-10-23 NOTE — Telephone Encounter (Signed)
Patient called - requested refill of Fentanyl patches 

## 2020-10-29 ENCOUNTER — Other Ambulatory Visit: Payer: Self-pay | Admitting: Internal Medicine

## 2020-10-29 ENCOUNTER — Telehealth: Payer: Self-pay | Admitting: Family Medicine

## 2020-10-29 DIAGNOSIS — E890 Postprocedural hypothyroidism: Secondary | ICD-10-CM

## 2020-10-29 NOTE — Telephone Encounter (Signed)
Left message for patient to call back and schedule Medicare Annual Wellness Visit (AWV) either virtually or in office.   Last AWV 09/18/2019  please schedule at anytime  This should be a 45 minute visit.

## 2020-10-30 ENCOUNTER — Other Ambulatory Visit (INDEPENDENT_AMBULATORY_CARE_PROVIDER_SITE_OTHER): Payer: Medicare HMO

## 2020-10-30 ENCOUNTER — Other Ambulatory Visit: Payer: Self-pay

## 2020-10-30 DIAGNOSIS — E890 Postprocedural hypothyroidism: Secondary | ICD-10-CM | POA: Diagnosis not present

## 2020-10-30 LAB — TSH: TSH: 1.96 u[IU]/mL (ref 0.35–4.50)

## 2020-10-30 LAB — T4, FREE: Free T4: 0.9 ng/dL (ref 0.60–1.60)

## 2020-11-01 ENCOUNTER — Other Ambulatory Visit: Payer: Self-pay

## 2020-11-01 ENCOUNTER — Telehealth: Payer: Self-pay

## 2020-11-01 DIAGNOSIS — C9000 Multiple myeloma not having achieved remission: Secondary | ICD-10-CM

## 2020-11-01 MED ORDER — ACYCLOVIR 400 MG PO TABS: 400.0000 mg | ORAL_TABLET | Freq: Two times a day (BID) | ORAL | 3 refills | Status: DC

## 2020-11-01 NOTE — Telephone Encounter (Signed)
Refill sent to pharmacy for requested Acyclovir. Also advised patient that per Dr. Irene Limbo he recommends that she hold off on any routine cleanings at the dental office while on treatment. Patient verbalized understanding and had no other questions or concerns.

## 2020-11-07 MED FILL — NINLARO 3 MG CAPS: 3 | 28 days supply | Qty: 3 | Fill #2

## 2020-11-11 ENCOUNTER — Telehealth: Payer: Self-pay

## 2020-11-11 NOTE — Telephone Encounter (Signed)
Oral Oncology Patient Advocate Encounter  Was successful in securing patient a $11000 grant from [employer] to provide copayment coverage for [employer].  This will keep the out of pocket expense at $0.     [employer] ID: [id number]  I have spoken with the patient.   The billing information is as follows and has been shared with Drain: [id number] PCN: [id number] Member ID: [id number] Group ID: [id number] Dates of Eligibility: 11/14/20 through 11/13/21  Fund:  [place] Patient [place] Phone [phone] Fax [phone] 11/11/2020 10:45 AM

## 2020-11-15 ENCOUNTER — Encounter: Payer: Self-pay | Admitting: Gastroenterology

## 2020-11-22 ENCOUNTER — Other Ambulatory Visit: Payer: Self-pay | Admitting: *Deleted

## 2020-11-22 DIAGNOSIS — C9000 Multiple myeloma not having achieved remission: Secondary | ICD-10-CM

## 2020-11-22 MED ORDER — FENTANYL 12 MCG/HR TD PT72: 1.0000 | MEDICATED_PATCH | TRANSDERMAL | 0 refills | Status: DC

## 2020-11-22 NOTE — Telephone Encounter (Signed)
Patient called and requested refill of Fentanyl patch - request routed to Dr. Irene Limbo

## 2020-11-27 ENCOUNTER — Inpatient Hospital Stay: Payer: Medicare HMO | Attending: Hematology

## 2020-11-27 ENCOUNTER — Other Ambulatory Visit: Payer: Self-pay

## 2020-11-27 ENCOUNTER — Other Ambulatory Visit: Payer: Medicare HMO

## 2020-11-27 DIAGNOSIS — C9000 Multiple myeloma not having achieved remission: Secondary | ICD-10-CM

## 2020-11-27 LAB — CBC WITH DIFFERENTIAL/PLATELET
Abs Immature Granulocytes: 0.01 10*3/uL (ref 0.00–0.07)
Basophils Absolute: 0.1 10*3/uL (ref 0.0–0.1)
Basophils Relative: 1 %
Eosinophils Absolute: 0 10*3/uL (ref 0.0–0.5)
Eosinophils Relative: 1 %
HCT: 37.9 % (ref 36.0–46.0)
Hemoglobin: 12.3 g/dL (ref 12.0–15.0)
Immature Granulocytes: 0 %
Lymphocytes Relative: 9 %
Lymphs Abs: 0.5 10*3/uL — ABNORMAL LOW (ref 0.7–4.0)
MCH: 33.2 pg (ref 26.0–34.0)
MCHC: 32.5 g/dL (ref 30.0–36.0)
MCV: 102.4 fL — ABNORMAL HIGH (ref 80.0–100.0)
Monocytes Absolute: 0.5 10*3/uL (ref 0.1–1.0)
Monocytes Relative: 9 %
Neutro Abs: 4.4 10*3/uL (ref 1.7–7.7)
Neutrophils Relative %: 80 %
Platelets: 204 10*3/uL (ref 150–400)
RBC: 3.7 MIL/uL — ABNORMAL LOW (ref 3.87–5.11)
RDW: 14.7 % (ref 11.5–15.5)
WBC: 5.4 10*3/uL (ref 4.0–10.5)
nRBC: 0 % (ref 0.0–0.2)

## 2020-11-27 LAB — CMP (CANCER CENTER ONLY)
ALT: 16 U/L (ref 0–44)
AST: 18 U/L (ref 15–41)
Albumin: 4.3 g/dL (ref 3.5–5.0)
Alkaline Phosphatase: 33 U/L — ABNORMAL LOW (ref 38–126)
Anion gap: 10 (ref 5–15)
BUN: 15 mg/dL (ref 8–23)
CO2: 26 mmol/L (ref 22–32)
Calcium: 9.2 mg/dL (ref 8.9–10.3)
Chloride: 106 mmol/L (ref 98–111)
Creatinine: 0.77 mg/dL (ref 0.44–1.00)
GFR, Estimated: >60 mL/min (ref >60)
Glucose, Bld: 102 mg/dL — ABNORMAL HIGH (ref 70–99)
Potassium: 4.2 mmol/L (ref 3.5–5.1)
Sodium: 142 mmol/L (ref 135–145)
Total Bilirubin: 0.4 mg/dL (ref 0.3–1.2)
Total Protein: 7.1 g/dL (ref 6.5–8.1)

## 2020-11-28 ENCOUNTER — Other Ambulatory Visit: Payer: Self-pay | Admitting: *Deleted

## 2020-11-28 DIAGNOSIS — C9000 Multiple myeloma not having achieved remission: Secondary | ICD-10-CM

## 2020-11-28 MED ORDER — OXYCODONE-ACETAMINOPHEN 5-325 MG PO TABS: 1.0000 | ORAL_TABLET | ORAL | 0 refills | Status: DC | PRN

## 2020-11-28 NOTE — Telephone Encounter (Signed)
Patient requested refill of oxycodone Refill routed to Dr. Irene Limbo

## 2020-11-29 LAB — MULTIPLE MYELOMA PANEL, SERUM
Albumin SerPl Elph-Mcnc: 4 g/dL (ref 2.9–4.4)
Albumin/Glob SerPl: 1.7 (ref 0.7–1.7)
Alpha 1: 0.3 g/dL (ref 0.0–0.4)
Alpha2 Glob SerPl Elph-Mcnc: 0.7 g/dL (ref 0.4–1.0)
B-Globulin SerPl Elph-Mcnc: 1.1 g/dL (ref 0.7–1.3)
Gamma Glob SerPl Elph-Mcnc: 0.4 g/dL (ref 0.4–1.8)
Globulin, Total: 2.4 g/dL (ref 2.2–3.9)
IgA: 27 mg/dL — ABNORMAL LOW (ref 64–422)
IgG (Immunoglobin G), Serum: 715 mg/dL (ref 586–1602)
IgM (Immunoglobulin M), Srm: 30 mg/dL (ref 26–217)
M Protein SerPl Elph-Mcnc: 0.3 g/dL — ABNORMAL HIGH
Total Protein ELP: 6.4 g/dL (ref 6.0–8.5)

## 2020-12-02 ENCOUNTER — Other Ambulatory Visit: Payer: Self-pay | Admitting: Hematology

## 2020-12-03 ENCOUNTER — Telehealth: Payer: Self-pay | Admitting: Hematology

## 2020-12-03 NOTE — Progress Notes (Signed)
HEMATOLOGY/ONCOLOGY CLINIC NOTE  Date of Service: 12/04/2020  Patient Care Team: [name], MD as PCP - General (Family Medicine) [name], MD as Consulting Physician (Hematology) Bond, [name], MD as Referring Physician (Ophthalmology) [name], [name], MD as Consulting Physician (Hematology and Oncology) [name], MD as Consulting Physician (Internal Medicine) [name], MD as Consulting Physician (General Surgery)  Dr. Ronney Lion as [place] Specialist  [phone]  CHIEF COMPLAINTS/PURPOSE OF CONSULTATION:   Multiple Myeloma- continued management  HISTORY OF PRESENTING ILLNESS:   Megan Olson is a wonderful 80 y.o. female who has been referred to [place] by Dr. 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 She is accompanied today by her husband. The pt reports that she is doing well overall.   The pt notes that she was doing extensive yard work in October 2019, and began to feel back pain a few days after this, which she attributed to muscle pain. She recalls taking a deep breath, and developing sudden acute pain, and presented to care with her PCP's office. She began exercises for her back pain, without relief, then began PT without relief, then was referred to Dr. Paulla Fore in sports medicine in late January 2020. She had an XR which revealed a compression fracture at T11, then subsequently had an MRI, and a bone marrow biopsy. The pt notes that her back pain "seemed to move around." She endorses pain "up the whole left side" of her back.  The pt notes that she is not able to stand up straight due to her back pain, which she feels limits her ability to take a deep breath, and endorses pain exacerbation when she does take a deep breath. The pt needs assistance from sitting to lying from her husband. She is using about 3 Percocet a day.  The pt notes worsened constipation since  beginning Percocet, and notes that her most recent laxative order was not covered by her insurance. She is using prune juice and milk of magnesia. She took Senokot S BID without relief.  The pt reports that prior to her recent back pain, she had very few medical concerns. She endorses controlled BP, and has been monitored for DM but after closely watching her diet her A1C decreased to 5.8. She has glaucoma, and has had surgery in both eyes. Right eye with stent. The pt sees Dr. Edilia Bo at [facility] for her eye care. The pt notes that her vision has been recently "pretty good." The pt notes that she has been advised to limit treatment with steroids for her glaucoma. She denies heart or lung problems. Denies previous back problems. Last DEXA scan 3 years ago, and endorses osteopenia. She notes that she took Fosamax for 3-4 years, and stopped 5-6 years ago. She takes Vitamin D, a multivitamin, and magnesium.  The pt notes that she quit smoking cigarettes when she was 27, started when she was about 20. The pt consumes alcohol rarely, but not since beginning narcotics. 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  Of note prior to the patient's visit today, pt has had an MRI Thoracic Spine completed on 11/27/18 with results revealing Multifocal marrow signal abnormality consistent with metastatic disease or multiple myeloma. The patient has multiple compression fractures most consistent with pathologic injuries. Extensive marrow signal abnormality makes determining age of the fractures difficult but edema is most intense in T3. Epidural tumor centrally and to the left posterior to T3  extends into the left neural foramen and could impact the left T3 root.  Most recent lab results (12/01/18) of CBC w/diff is as follows: all values are WNL except for RBC at 3.17, HGB at 10.4, HCT at 33.5, MCV at 105.7. 11/28/18 CMP revealed all values WNL except for Glucose at 105, Total Protein at 10.2, Albumin at 3.1 11/30/18 24HR  UPEP revealed all values WNL except for M-spike at 75.1%. 11/28/18 Beta-2 microglobulin slightly elevated at 2.6 11/28/18 SPEP revealed M spike at 4.6g 11/28/18 Immunoglobulins revealed IgG at 6181, IgA at 24, IgM at 16, and IgE at 6.  On review of systems, pt reports constipation, back pain, stable energy levels, ankle swelling, tenderness at T3, lower back pain, and denies abdominal pains, neck pain, and any other symptoms.   On PMHx the pt reports redundant colon, single hemorrhoid, glaucoma, HTN, osteopenia, Tonsillectomy, right eye stent and multiple eye surgeries. On Social Hx the pt reports rare alcohol use, smoked cigarettes between ages 60 and 36. 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 On Family Hx the pt reports sister died from small cell lung cancer and was a lifelong smoker, brother's daughter died of breast cancer with BRCA1 and BRCA2 mutations. Cousin with female breast cancer.  INTERVAL HISTORY:   Megan Olson returns today for management and evaluation of her multiple myeloma. The patient's last visit with [place] was on 10/09/2020. The pt reports that she is doing well overall.  The pt reports no issues tolerating the continued maintenance Ninlaro. The pt notes that she has gotten a lot of itchy areas in her upper neck region. The pt is seeing a Dermatologist for a spot on her left flank next week. The pt is still staying active and eating well.  Lab results 11/27/2020 of CBC w/diff and CMP is as follows: all values are WNL except for RBC of 3.70, MCV of 102.4, Lymphs Abs of 0.5K, Glucose of 102, Alkaline Phosphatase of 33. 11/27/2020 MMP WNL except IgA of 27, m-protein SerPl Elph-Mcnc of 0.3.  On review of systems, pt denies new bone pains, decreased appetite, abdominal pain, back pain, leg swelling, and any other symptoms.   MEDICAL HISTORY:  Past Medical History:  Diagnosis Date  . Anemia   . Cancer ([place])    multiple myeloma, thyroid cancer   . Glaucoma   . HYPERTENSION 03/11/2007   . Multiple myeloma ([place])   . OSTEOPENIA 03/11/2007  . Pre-diabetes     SURGICAL HISTORY: Past Surgical History:  Procedure Laterality Date  . BONE MARROW BIOPSY    . BREAST EXCISIONAL BIOPSY Right 2000  . BREAST LUMPECTOMY  1990   benign  . CATARACT EXTRACTION Bilateral 2018  . DILATION AND CURETTAGE OF UTERUS     bleeding at menopause. No uterine cancer  . IR RADIOLOGIST EVAL & MGMT  12/13/2018  . THYROIDECTOMY N/A 08/02/2020   Procedure: TOTAL THYROIDECTOMY;  Surgeon: [name], MD;  Location: [facility];  Service: General;  Laterality: N/A;  . TONSILLECTOMY     age 59    SOCIAL HISTORY: Social History   Socioeconomic History  . Marital status: Married    Spouse name: Not on file  . Number of children: 1  . Years of education: Not on file  . Highest education level: Not on file  Occupational History  . Occupation: Retired  Tobacco Use  . Smoking status: Former Smoker    Packs/day: 0.50    Years: 7.00    Pack years: 3.50  Types: Cigarettes    Quit date: 01/04/1961    Years since quitting: 59.9  . Smokeless tobacco: Never Used  Vaping Use  . Vaping Use: Never used  Substance and Sexual Activity  . Alcohol use: Yes    Alcohol/week: 1.0 standard drink    Types: 1 Standard drinks or equivalent per week    Comment: occas   . Drug use: No  . Sexual activity: Not Currently  Other Topics Concern  . Not on file  Social History Narrative   Married. Lives with husband (patient of Dr. Yong Channel). 1 son. No grandkids. 1 granddog.       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 Heart disease Mother        CHF mother died 3  . Arthritis Mother   . Glaucoma Mother        sister as well  . Alcohol abuse Father   . Suicidality Father   . Cancer Sister        lung cancer, smoker  . Heart disease Sister        aortic valve replacement  . Hyperlipidemia Brother   . Hypertension Brother   . COPD Brother   . Arthritis Sister   . Hypertension Sister   . Glaucoma Sister   . Hashimoto's thyroiditis Sister   . Hypertension Son   . Stroke Maternal Grandmother   . Colon cancer Neg Hx   . Colon polyps Neg Hx     ALLERGIES:  is allergic to ace inhibitors, benadryl [diphenhydramine], diamox [acetazolamide], sulfamethoxazole, lenalidomide, and penicillins.   MEDICATIONS:  Current Outpatient Medications  Medication Sig Dispense Refill  . acyclovir (ZOVIRAX) 400 MG tablet Take 1 tablet (400 mg total) by mouth 2 (two) times daily. 60 tablet 3  . ALPHAGAN P 0.1 % SOLN Place 1 drop into both eyes in the morning, at noon, and at bedtime.     Marland Kitchen amLODipine (NORVASC) 5 MG tablet Take 1 tablet (5 mg total) by mouth daily. (Patient taking differently: Take 5 mg by mouth at bedtime. ) 90 tablet 3  . aspirin EC 81 MG tablet Take 81 mg by mouth daily.    . bimatoprost (LUMIGAN) 0.03 % ophthalmic solution Place 1 drop into both eyes at bedtime.     . calcium carbonate (TUMS) 500 MG chewable tablet Chew 2 tablets (400 mg of elemental calcium total) by mouth 2 (two) times daily. 90 tablet 1  . Cholecalciferol (VITAMIN D3) 125 MCG (5000 UT) TABS Take 5,000 Units by mouth daily.     Marland Kitchen Co-Enzyme Q-10 100 MG CAPS Take 100 mg by mouth daily.     . dorzolamide-timolol (COSOPT) 22.3-6.8 MG/ML ophthalmic solution Place 1 drop into both eyes 2 (two) times daily.   11  . fentaNYL (DURAGESIC) 12 MCG/HR Place 1 patch onto the skin every 3 (three) days. 10 patch 0  . hydrOXYzine (ATARAX/VISTARIL) 25 MG tablet Take 1 tablet (25 mg total)  by mouth 3 (three) times  daily as needed. 30 tablet 0  . levothyroxine (SYNTHROID) 75 MCG tablet Take 1 tablet (75 mcg total) by mouth daily before breakfast. 45 tablet 3  . magnesium hydroxide (MILK OF MAGNESIA) 400 MG/5ML suspension Take 15 mLs by mouth daily as needed for mild constipation or moderate constipation. 355 mL 0  . Multiple Vitamins-Minerals (MULTIVITAMIN WITH MINERALS) tablet Take 1 tablet by mouth daily. Centrum Silver 50 +    . NINLARO 3 MG capsule TAKE 1 CAP (3 MG) BY MOUTH WEEKLY, 3 WEEKS ON, 1 WEEK OFF, REPEAT EVERY 4 WEEKS. TAKE ON AN EMPTY STOMACH 1HR BEFORE OR 2HR AFTER MEAL 3 capsule 2  . Omega-3 1000 MG CAPS Take 1,000 mg by mouth daily.     Marland Kitchen oxyCODONE-acetaminophen (PERCOCET) 5-325 MG tablet Take 1-2 tablets by mouth every 4 (four) hours as needed for moderate pain or severe pain. 90 tablet 0  . polyethylene glycol (MIRALAX) packet Take 17 g by mouth daily. 30 each 1  . senna-docusate (SENOKOT-S) 8.6-50 MG tablet Take 2 tablets by mouth at bedtime. (Patient taking differently: Take 1 tablet by mouth at bedtime. ) 60 tablet 2  . simethicone (MYLICON) 80 MG chewable tablet Chew 80 mg by mouth every evening.     . vitamin C (ASCORBIC ACID) 500 MG tablet Take 500 mg by mouth 2 (two) times a week.      No current facility-administered medications for this visit.    REVIEW OF SYSTEMS:   10 Point review of Systems was done is negative except as noted above.  PHYSICAL EXAMINATION: ECOG PERFORMANCE STATUS: 1-2  Vitals:   12/04/20 0957  BP: (!) 162/82  Pulse: 68  Resp: 13  Temp: 97.9 F (36.6 C)  SpO2: 100%   Filed Weights   12/04/20 0957  Weight: 112 lb 1.6 oz (50.8 kg)   Body mass index is 20.5 kg/m.   Exam was given in a chair.  GENERAL:alert, in no acute distress and comfortable SKIN: no acute rashes, no significant lesions EYES: conjunctiva are pink and non-injected, sclera anicteric OROPHARYNX: MMM, no exudates, no oropharyngeal erythema or  ulceration NECK: supple, no JVD LYMPH:  no palpable lymphadenopathy in the cervical, axillary or inguinal regions LUNGS: clear to auscultation b/l with normal respiratory effort HEART: regular rate & rhythm ABDOMEN:  normoactive bowel sounds , non tender, not distended. Extremity: no pedal edema PSYCH: alert & oriented x 3 with fluent speech NEURO: no focal motor/sensory deficits   LABORATORY DATA:  I have reviewed the data as listed  . CBC Latest Ref Rng & Units 11/27/2020 10/02/2020 08/07/2020  WBC 4.0 - 10.5 K/uL 5.4 3.9(L) 4.2  Hemoglobin 12.0 - 15.0 g/dL 12.3 12.0 11.6(L)  Hematocrit 36.0 - 46.0 % 37.9 36.8 36.0  Platelets 150 - 400 K/uL 204 215 244    . CMP Latest Ref Rng & Units 11/27/2020 10/02/2020 08/07/2020  Glucose 70 - 99 mg/dL 102(H) 100(H) 100(H)  BUN 8 - 23 mg/dL $Remove'15 11 14  'AZdtRBV$ Creatinine 0.44 - 1.00 mg/dL 0.77 0.70 0.69  Sodium 135 - 145 mmol/L 142 142 141  Potassium 3.5 - 5.1 mmol/L 4.2 4.3 4.0  Chloride 98 - 111 mmol/L 106 107 105  CO2 22 - 32 mmol/L $RemoveB'26 29 30  'tPIXYqQs$ Calcium 8.9 - 10.3 mg/dL 9.2 9.3 9.6  Total Protein 6.5 - 8.1 g/dL 7.1 7.0 6.7  Total Bilirubin 0.3 - 1.2 mg/dL 0.4 0.4 0.5  Alkaline Phos 38 - 126 U/L 33(L) 30(L) 31(L)  AST 15 -  41 U/L 18 21 16   ALT 0 - 44 U/L 16 16 13    09/11/2019 Flow Pathology Report [phone]):   09/11/2019 Bone Marrow Report [id number]):    07/14/2019 Cytogenetics [id number]):    07/14/2019 Flow Pathology Report [id number]):   07/14/2019 BM Bx ([id number]):    12/01/18 BM Bx:   12/01/18 Flow Cytometry:       RADIOGRAPHIC STUDIES: I have personally reviewed the radiological images as listed and agreed with the findings in the report. No results found.   ASSESSMENT & PLAN:  80 y.o. female with  1. Multiple Myeloma with IgG Lambda specificity Labs upon initial presentation from 12/01/18 CBC w/diff revealed HGB at 10.4 with MCV of 105.7. 11/28/18 CMP revealed Creatinine normal at 0.68 and Calcium  normal at 8.9. 11/28/18 Beta 2 microglobulin at 2.6mg  (also a reading at 4.7mg  on the same day). 11/30/18 24HR UPEP revealed M spike at 75.1%. 11/28/18 SPEP revealed M spike of 4.6g. 11/28/18 Immunoglobulins revealed IgG elevated at 6181mg .  12/01/18 BM Bx revealed hypercellular bone marrow with 70-80% CD138 immunohistochemistry (44% aspirate) lambda-restricted plasma cells as well as a kappa restricted population of B-cells 11/27/18 MRI Thoracic Spine which revealed Multifocal marrow signal abnormality consistent with metastatic disease or multiple myeloma. The patient has multiple compression fractures most consistent with pathologic injuries. Extensive marrow signal abnormality makes determining age of the fractures difficult but edema is most intense in T3. 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 2. Areas of hypermetabolic disease identified in both lungs suggesting multiple myeloma involvement. 3. 1.9 cm calcified left thyroid nodule is hypermetabolic, but Indeterminate. 4. Colon is diffusely distended with gas and stool. Imaging features would be compatible with clinical constipation. 5.  Aortic Atherosclerois.  Pt describes grade II to III rash on her re-challenge from Revlimid with optimized pre-medications, and we discontinued Revlimid  Began Cytoxan with C4 Velcade and Dexamethasone  05/16/2019 M spike is down to 0.2g/dl showing continued improvement and a 90% reduction  -07/14/2019 BM Bx revealed BONE MARROW: - Hypercellular marrow with residual plasma cell neoplasm (<10%) PERIPHERAL BLOOD: - Pancytopenia".   07/17/2019 PET/CT Whole Body Scan  ([id number]) revealed "1. 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 2. Hypermetabolic calcified left thyroid nodule, as before, indeterminate. 3. Aortic atherosclerosis (ICD10-170.0). Coronary artery calcification."  09/11/2019 Bone Marrow Report ([id number]) revealed "BONE MARROW, ASPIRATE, CLOT, CORE:  -Variably cellular bone marrow with trilineage hematopoiesis and 2% plasma cells PERIPHERAL BLOOD:  -Pancytopenia".  09/11/2019 Flow Pathology Report [id number]) revealed "-Predominance of T lymphocytes with relative abundance of CD8 positive cells  -No significant B-cell population identified -Relative abundance of natural killer cells".  09/14/2019 PET/CT ([id number]) revealed "1. No findings of active malignancy. 2. Numerous thoracolumbar compression fractures many of which may be associated with osteoporosis. 3. Calcified 1.5 cm in diameter left thyroid nodule with persistent elevated activity. A significant minority of hypermetabolic thyroid nodules can be malignant, if not previously worked up to then thyroid ultrasound would be recommended as a next step. 4. There a few small lucent and small sclerotic lesions in the skeleton which are not hypermetabolic, possibilities include benign lesions or previously effectively treated  myeloma. 5. Other imaging findings of potential clinical significance: Aortic Atherosclerosis (ICD10-I70.0). Coronary atherosclerosis. Prominent stool throughout the colon favors constipation. Hyperdense right renal lesion is probably a complex cyst but technically nonspecific."  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 She does not currently have elevated lymphocyte counts on peripheral blood draws.   PLAN: -Discussed pt labwork,  11/27/2020; blood counts and chemistries normal. M-protein at 0.3. Will continue to monitor. -Advised pt that the m protein must increase by 0.5 prior to calling this progression. No progression at this time. -Discussed decreased Alkaline Phosphatase level; not a concern at this time. -Discussed CDC guidelines regarding the COVID vaccines and booster. No second booster shot at this time. -Discussed Evusheld and pt's eligibility. The pt wishes to receive more information regarding this. -Discussed dental cleaning and procedures while being on Zometa.  -Advised pt it is okay to proceed with Colonoscopy at this time. -Advised pt that eye watering can occur from dry eyes due to heat and steroid use.  -No lab or clinical evidence of significant Multiple Myeloma or Monoclonal B-Cell Lymphocytosis progression at this time. -The pt has no prohibitive toxicities from continuing Ninlaro (3 weeks on & 1 week off) at this time. -Continue 12.5 mcg/hr Fentanyl patch & Oxycodone prn for pain management.  -Continue 5000 UT Vitamin D daily. Goal Vitamin D is 60 <>90  -Continue Zometa q8weeks  -Will see back in 2 months with labs 1 week prior.   FOLLOW UP: RTC with Dr Irene Limbo with next Zometa infusion in 2 months Labs 1 week prior to clinic visit    The total time spent in the appointment was 20 minutes and more than 50% was on counseling and direct patient cares.  All of the patient's questions were answered with apparent satisfaction. The patient knows to call the clinic with any problems, questions or concerns.    Sullivan Lone MD [place] AAHIVMS [facility] Hematology/Oncology Physician [facility]  (Office):       [phone] (Work cell):  [phone] (Fax):           [phone]  12/04/2020 10:12 AM  I, Reinaldo Raddle, am acting as scribe for Dr. Sullivan Lone, MD.    .I have reviewed the above documentation for accuracy and completeness, and I agree with the above. [name] MD

## 2020-12-03 NOTE — Telephone Encounter (Signed)
Called patient regarding 03/02 appointment, patient is notified.

## 2020-12-04 ENCOUNTER — Ambulatory Visit: Payer: Medicare HMO

## 2020-12-04 ENCOUNTER — Other Ambulatory Visit: Payer: Self-pay

## 2020-12-04 ENCOUNTER — Inpatient Hospital Stay: Payer: Medicare HMO | Attending: Hematology | Admitting: Hematology

## 2020-12-04 ENCOUNTER — Inpatient Hospital Stay: Payer: Medicare HMO

## 2020-12-04 ENCOUNTER — Ambulatory Visit: Payer: Medicare HMO | Admitting: Hematology

## 2020-12-04 ENCOUNTER — Other Ambulatory Visit: Payer: Medicare HMO

## 2020-12-04 ENCOUNTER — Telehealth: Payer: Self-pay | Admitting: Hematology

## 2020-12-04 VITALS — BP 162/82 | HR 68 | Temp 97.9°F | Resp 13 | Ht 62.0 in | Wt 112.1 lb

## 2020-12-04 DIAGNOSIS — I700 Atherosclerosis of aorta: Secondary | ICD-10-CM | POA: Diagnosis not present

## 2020-12-04 DIAGNOSIS — D61818 Other pancytopenia: Secondary | ICD-10-CM | POA: Diagnosis not present

## 2020-12-04 DIAGNOSIS — Z87891 Personal history of nicotine dependence: Secondary | ICD-10-CM | POA: Diagnosis not present

## 2020-12-04 DIAGNOSIS — Z5111 Encounter for antineoplastic chemotherapy: Secondary | ICD-10-CM

## 2020-12-04 DIAGNOSIS — M8580 Other specified disorders of bone density and structure, unspecified site: Secondary | ICD-10-CM | POA: Insufficient documentation

## 2020-12-04 DIAGNOSIS — K5900 Constipation, unspecified: Secondary | ICD-10-CM | POA: Insufficient documentation

## 2020-12-04 DIAGNOSIS — I10 Essential (primary) hypertension: Secondary | ICD-10-CM | POA: Diagnosis not present

## 2020-12-04 DIAGNOSIS — Z79899 Other long term (current) drug therapy: Secondary | ICD-10-CM | POA: Insufficient documentation

## 2020-12-04 DIAGNOSIS — M545 Low back pain, unspecified: Secondary | ICD-10-CM | POA: Insufficient documentation

## 2020-12-04 DIAGNOSIS — C9000 Multiple myeloma not having achieved remission: Secondary | ICD-10-CM

## 2020-12-04 DIAGNOSIS — Z7189 Other specified counseling: Secondary | ICD-10-CM

## 2020-12-04 MED ORDER — ZOLEDRONIC ACID 4 MG/100ML IV SOLN
4.0000 mg | Freq: Once | INTRAVENOUS | Status: AC
  Administered 2020-12-04: 4 mg via INTRAVENOUS

## 2020-12-04 MED ORDER — ZOLEDRONIC ACID 4 MG/100ML IV SOLN
INTRAVENOUS | Status: AC
  Filled 2020-12-04: qty 100

## 2020-12-04 MED FILL — NINLARO 3 MG CAPS: 3 | 28 days supply | Qty: 3 | Fill #0

## 2020-12-04 NOTE — Telephone Encounter (Signed)
Scheduled appointments per 3/2 los. Spoke to patient who is aware of appointments dates and times.

## 2020-12-04 NOTE — Patient Instructions (Signed)
Zoledronic Acid Injection (Hypercalcemia, Oncology) What is this medicine? ZOLEDRONIC ACID (ZOE le dron ik AS id) slows calcium loss from bones. It high calcium levels in the blood from some kinds of cancer. It may be used in other people at risk for bone loss. This medicine may be used for other purposes; ask your health care provider or pharmacist if you have questions. COMMON BRAND NAME(S): Zometa What should I tell my health care provider before I take this medicine? They need to know if you have any of these conditions:  cancer  dehydration  dental disease  kidney disease  liver disease  low levels of calcium in the blood  lung or breathing disease (asthma)  receiving steroids like dexamethasone or prednisone  an unusual or allergic reaction to zoledronic acid, other medicines, foods, dyes, or preservatives  pregnant or trying to get pregnant  breast-feeding How should I use this medicine? This drug is injected into a vein. It is given by a health care provider in a hospital or clinic setting. Talk to your health care provider about the use of this drug in children. Special care may be needed. Overdosage: If you think you have taken too much of this medicine contact a poison control center or emergency room at once. NOTE: This medicine is only for you. Do not share this medicine with others. What if I miss a dose? Keep appointments for follow-up doses. It is important not to miss your dose. Call your health care provider if you are unable to keep an appointment. What may interact with this medicine?  certain antibiotics given by injection  NSAIDs, medicines for pain and inflammation, like ibuprofen or naproxen  some diuretics like bumetanide, furosemide  teriparatide  thalidomide This list may not describe all possible interactions. Give your health care provider a list of all the medicines, herbs, non-prescription drugs, or dietary supplements you use. Also tell  them if you smoke, drink alcohol, or use illegal drugs. Some items may interact with your medicine. What should I watch for while using this medicine? Visit your health care provider for regular checks on your progress. It may be some time before you see the benefit from this drug. Some people who take this drug have severe bone, joint, or muscle pain. This drug may also increase your risk for jaw problems or a broken thigh bone. Tell your health care provider right away if you have severe pain in your jaw, bones, joints, or muscles. Tell you health care provider if you have any pain that does not go away or that gets worse. Tell your dentist and dental surgeon that you are taking this drug. You should not have major dental surgery while on this drug. See your dentist to have a dental exam and fix any dental problems before starting this drug. Take good care of your teeth while on this drug. Make sure you see your dentist for regular follow-up appointments. You should make sure you get enough calcium and vitamin D while you are taking this drug. Discuss the foods you eat and the vitamins you take with your health care provider. Check with your health care provider if you have severe diarrhea, nausea, and vomiting, or if you sweat a lot. The loss of too much body fluid may make it dangerous for you to take this drug. You may need blood work done while you are taking this drug. Do not become pregnant while taking this drug. Women should inform their health care provider   if they wish to become pregnant or think they might be pregnant. There is potential for serious harm to an unborn child. Talk to your health care provider for more information. What side effects may I notice from receiving this medicine? Side effects that you should report to your doctor or health care provider as soon as possible:  allergic reactions (skin rash, itching or hives; swelling of the face, lips, or tongue)  bone  pain  infection (fever, chills, cough, sore throat, pain or trouble passing urine)  jaw pain, especially after dental work  joint pain  kidney injury (trouble passing urine or change in the amount of urine)  low blood pressure (dizziness; feeling faint or lightheaded, falls; unusually weak or tired)  low calcium levels (fast heartbeat; muscle cramps or pain; pain, tingling, or numbness in the hands or feet; seizures)  low magnesium levels (fast, irregular heartbeat; muscle cramp or pain; muscle weakness; tremors; seizures)  low red blood cell counts (trouble breathing; feeling faint; lightheaded, falls; unusually weak or tired)  muscle pain  redness, blistering, peeling, or loosening of the skin, including inside the mouth  severe diarrhea  swelling of the ankles, feet, hands  trouble breathing Side effects that usually do not require medical attention (report to your doctor or health care provider if they continue or are bothersome):  anxious  constipation  coughing  depressed mood  eye irritation, itching, or pain  fever  general ill feeling or flu-like symptoms  nausea  pain, redness, or irritation at site where injected  trouble sleeping This list may not describe all possible side effects. Call your doctor for medical advice about side effects. You may report side effects to FDA at 1-800-FDA-1088. Where should I keep my medicine? This drug is given in a hospital or clinic. It will not be stored at home. NOTE: This sheet is a summary. It may not cover all possible information. If you have questions about this medicine, talk to your doctor, pharmacist, or health care provider.  2021 [employer] (2019-07-06 09:13:00)  

## 2020-12-10 ENCOUNTER — Telehealth: Payer: Self-pay | Admitting: *Deleted

## 2020-12-10 ENCOUNTER — Encounter: Payer: Self-pay | Admitting: *Deleted

## 2020-12-10 NOTE — Telephone Encounter (Signed)
Patient called with 2 questions for Dr.Kale  1)Said you discussed Evusheld with her last week and she forgot to ask for info before she left. 2)She is due for screening colonoscopy. Wants to know if there is a "best time" to schedule it: Week OFF treatment or immediately before or immediately after treatment.  Dr.Kale informed. Contacted patient with his responses: 1) Anytime during treatment is ok   2) Link for information about Evusheld  [email or web address] Patient verbalized understanding and asked to have link sent to her via [place] sent

## 2020-12-11 ENCOUNTER — Ambulatory Visit: Payer: Medicare HMO

## 2020-12-11 ENCOUNTER — Ambulatory Visit: Payer: Medicare HMO | Admitting: Hematology

## 2020-12-13 DIAGNOSIS — L821 Other seborrheic keratosis: Secondary | ICD-10-CM | POA: Diagnosis not present

## 2020-12-13 DIAGNOSIS — D485 Neoplasm of uncertain behavior of skin: Secondary | ICD-10-CM | POA: Diagnosis not present

## 2020-12-13 DIAGNOSIS — L820 Inflamed seborrheic keratosis: Secondary | ICD-10-CM | POA: Diagnosis not present

## 2020-12-23 ENCOUNTER — Telehealth: Payer: Self-pay | Admitting: *Deleted

## 2020-12-23 DIAGNOSIS — H401112 Primary open-angle glaucoma, right eye, moderate stage: Secondary | ICD-10-CM | POA: Diagnosis not present

## 2020-12-23 DIAGNOSIS — H401122 Primary open-angle glaucoma, left eye, moderate stage: Secondary | ICD-10-CM | POA: Diagnosis not present

## 2020-12-23 NOTE — Telephone Encounter (Signed)
Pt called for refill of Fentanyl patches. Has 2 left.

## 2020-12-24 ENCOUNTER — Other Ambulatory Visit: Payer: Self-pay | Admitting: *Deleted

## 2020-12-24 DIAGNOSIS — C9000 Multiple myeloma not having achieved remission: Secondary | ICD-10-CM

## 2020-12-24 MED ORDER — FENTANYL 12 MCG/HR TD PT72: 1.0000 | MEDICATED_PATCH | TRANSDERMAL | 0 refills | Status: DC

## 2020-12-24 NOTE — Telephone Encounter (Signed)
Opened in error

## 2020-12-27 ENCOUNTER — Telehealth: Payer: Self-pay

## 2020-12-27 NOTE — Telephone Encounter (Signed)
Returned call to pt regarding question she had about a text she got. I let her know that it did not come from [facility]. Informed pt that messages from [facility] may show in my Chart. Pt was going to check there.

## 2021-01-02 ENCOUNTER — Other Ambulatory Visit (HOSPITAL_COMMUNITY): Payer: Self-pay

## 2021-01-02 MED FILL — NINLARO 3 MG CAPS: 3 | 28 days supply | Qty: 3 | Fill #1

## 2021-01-13 ENCOUNTER — Other Ambulatory Visit: Payer: Self-pay

## 2021-01-13 ENCOUNTER — Encounter: Payer: Self-pay | Admitting: Internal Medicine

## 2021-01-13 ENCOUNTER — Ambulatory Visit: Payer: Medicare HMO | Admitting: Internal Medicine

## 2021-01-13 VITALS — BP 140/100 | HR 67 | Ht 62.0 in | Wt 109.6 lb

## 2021-01-13 DIAGNOSIS — R7303 Prediabetes: Secondary | ICD-10-CM | POA: Diagnosis not present

## 2021-01-13 DIAGNOSIS — E890 Postprocedural hypothyroidism: Secondary | ICD-10-CM

## 2021-01-13 DIAGNOSIS — C73 Malignant neoplasm of thyroid gland: Secondary | ICD-10-CM | POA: Diagnosis not present

## 2021-01-13 LAB — POCT GLYCOSYLATED HEMOGLOBIN (HGB A1C): Hemoglobin A1C: 5.9 % — AB (ref 4.0–5.6)

## 2021-01-13 MED ORDER — LEVOTHYROXINE SODIUM 75 MCG PO TABS: 75.0000 ug | ORAL_TABLET | Freq: Every day | ORAL | 3 refills | Status: DC

## 2021-01-13 NOTE — Progress Notes (Signed)
Patient ID: Megan Olson, female   DOB: March 21, 1941, 80 y.o.   MRN: [id number]   This visit occurred during the SARS-CoV-2 public health emergency.  Safety protocols were in place, including screening questions prior to the visit, additional usage of staff PPE, and extensive cleaning of exam room while observing appropriate contact time as indicated for disinfecting solutions.   HPI  DEEPTI Olson is a 80 y.o.-year-old very nice female, initially referred by her PCP, Dr. Yong Channel, returning for follow-up for newly diagnosed thyroid cancer and prediabetes.  Last visit 4 months ago.  Interim history: She continues chemotherapy for multiple myeloma.   She otherwise feels well and her diarrhea improved after decreasing the dose of levothyroxine.  Papillary thyroid cancer:  Reviewed history: Patient was noticed to have a FDG-avid thyroid nodule on recent PET/CT scans obtained for patient's multiple myeloma.  A thyroid ultrasound that showed 2 nodules meeting criteria for biopsy.  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  She had total thyroidectomy by Dr. Harlow Asa.  PET/CT (07/17/2019): CHEST: Persistent hypermetabolism in a partially calcified 1.8 cm left thyroid nodule, SUV max 4.5. No hypermetabolic mediastinal, hilar or axillary lymph nodes.  PET/CT (09/14/2019): HEAD/NECK: Calcified 1.5 cm in diameter left thyroid nodule maximum SUV 6.7, previously 7.7.  Thyroid U/S (06/24/2020): Parenchymal Echotexture: Mildly heterogenous Isthmus: Normal in size measuring 0.3 cm in diameter Right lobe: Atrophic in size measuring 3.7 x 1.3 x 1.5 cm Left lobe: Atrophic in size measuring 3.4 x 1.6 x 1.8 cm _________________________________________________________  Estimated total number of nodules >/= 1 cm: 2  Number of spongiform nodules >/=  2 cm not described below (TR1): 0  Number of mixed cystic and solid nodules >/= 1.5 cm not described below (TR2):  0 _________________________________________________________  Scattered punctate (sub 5 mm) anechoic cysts and hypoechoic nodules within the right lobe of the thyroid, several which contain internal echogenic foci with ring down artifact compatible with benign colloid. None of these punctate nodules meet imaging criteria to recommend percutaneous sampling or continued dedicated follow-up. ________________________________________________________  Nodule # 1: Location: Left; Mid - this nodule appears hypermetabolic on preceding PET-CT Maximum size: 2.0 cm; Other 2 dimensions: 1.8 x 1.7 cm Composition: cannot determine (2) Echogenicity: cannot determine (1) Shape: not taller-than-wide (0) Margins: lobulated/irregular (2) Echogenic foci: peripheral calcifications (2) Additional echogenic foci 1:  macrocalcifications (1)  **Given size (>/= 1.0 cm) and appearance, fine needle aspiration of this highly suspicious nodule should be considered based on TI-RADS criteria. _________________________________________________________  Nodule # 2: Location: Left; mid - this nodule appears hypermetabolic on preceding PET-CT Maximum size: 1.2 cm; Other 2 dimensions: 0.9 x 0.7 cm Composition: solid/almost completely solid (2) Echogenicity: hypoechoic (2) Shape: not taller-than-wide (0) Margins: lobulated/irregular (2) Echogenic foci: none (0) *Given size (>/= 1 - 1.4 cm) and appearance, a follow-up ultrasound in 1 year should be considered based on TI-RADS criteria. However - see below: Bx recommended. _________________________________________________________  There is an additional punctate (approximately 0.5 cm) anechoic cyst within left lobe of the thyroid which does not meet criteria to recommend percutaneous sampling or continued dedicated follow-up.  IMPRESSION: 1. Findings suggestive of multinodular goiter. 2. Nodule #1,correlating with one of hypermetabolic nodules seen  on preceding PET-CT, meets imaging criteria to recommend percutaneous sampling as clinically indicated. 3. Nodule #2 meets TIRADS criteria to only recommend a 1 year follow-up, though as this nodule also appears hypermetabolic on preceding PET-CT, percutaneous sampling is also recommended for this nodule as hypermetabolic nodules  have a higher rate of harboring malignancy. 4. None of the remaining thyroid nodules/cysts meet imaging criteria to recommend percutaneous sampling or continued dedicated follow-up.  Biopsy of the above 2 nodules.  FNAs (07/10/2020): Specimen Submitted: A. 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 THYROID,LEFT MID #1, FINE NEEDLE ASPIRATION:  FINAL MICROSCOPIC DIAGNOSIS:  - Findings consistent with papillary carcinoma (Bethesda category VI)  SPECIMEN ADEQUACY:  Satisfactory for evaluation  Total thyroidectomy (08/02/2020): A. THYROID, TOTAL THYROIDECTOMY:  - Papillary thyroid carcinoma, classical variant, at least 3 cm  - Portion of the tumor shows hyaline fibrosis and calcification  - Anterior surface resection margin is involved by tumor  - Definite evidence of extrathyroidal extension is not identified  - Negative for lymphovascular invasion   THYROID GLAND:   Procedure: Total thyroidectomy  Tumor Focality: Unifocal  Tumor Site: Mid and inferior pole of left lobe  Tumor Size: At least 3 cm  Histologic Type: Papillary thyroid carcinoma, classical variant  Margins: The anterior surface margin is involved by carcinoma  Angioinvasion: Not identified  Lymphatic Invasion: Not identified  Extrathyroidal extension: Not identified  Regional Lymph Nodes: No lymph nodes submitted or found  Pathologic Stage Classification (pTNM, AJCC 8th Edition): pT2, pN not  assigned (no nodes  submitted or found)  Comment(s): Though 2 separate lesions were described grossly, they  appeared to be part of a single tumor with prominent fibrosis and calcification of a portion of the tumor.    Lab Results  Component Value Date   THYROGLB 0.4 (L) 09/05/2020   Lab Results  Component Value Date   THGAB <1 09/05/2020   Pt denies: - feeling nodules in neck - hoarseness - dysphagia - choking - SOB with lying down  She was on levothyroxine 88 mcg daily but due to diarrhea we decreased the dose to 75 mcg daily.  She takes this: - in am - fasting - at least 1h  from b'fast - off calcium - no iron - + multivitamins at lunchtime - no PPIs - not on Biotin - + slow Mag with calcium at dinner  Reviewed her TFTs: Lab Results  Component Value Date   TSH 1.96 10/30/2020   TSH 0.56 09/05/2020   TSH 1.006 09/19/2019   TSH 1.84 03/25/2016   TSH 1.47 03/22/2015   TSH 1.58 03/19/2014   TSH 1.79 03/08/2013   TSH 1.90 03/07/2012   TSH 2.04 02/17/2011   TSH 1.68 02/03/2010   FREET4 0.90 10/30/2020   FREET4 1.04 09/05/2020   FREET4 1.10 09/19/2019    She has family history of thyroid disease: Hashimoto's thyroidism in sister. No FH of thyroid cancer. No h/o radiation tx to head or neck.  No herbal supplements. No Biotin use. No recent steroids use.   Prediabetes:  Reviewed HbA1c levels: Lab Results  Component Value Date   HGBA1C 5.9 (A) 06/18/2020   HGBA1C 5.8 (H) 01/26/2020   HGBA1C 5.6 10/10/2018   HGBA1C 6.3 04/04/2018   HGBA1C 5.9 09/17/2017   HGBA1C 6.2 03/26/2017   HGBA1C 5.9 03/22/2015   She does not check blood sugars at home.  + HL: Lab Results  Component Value Date   CHOL 220 (H) 01/26/2020   HDL 68 01/26/2020   LDLCALC 132 (H) 01/26/2020   TRIG 97 01/26/2020   CHOLHDL 3.2 01/26/2020  She is not on a statin  Kidney function was normal: Lab Results  Component Value Date   BUN 15 11/27/2020   Lab Results  Component Value Date   CREATININE 0.77  11/27/2020  She is not on ACE inhibitor or ARB.  She takes an aspirin 81 mg daily.  She has a history of multiple myeloma, which is now in remission.  Previously on dexamethasone with chemotherapy, not now. Also, history of multilevel thoracic vertebral fractures.  ROS: Constitutional: no weight gain/+ weight loss after her multiple myeloma diagnosis, but not recently, no fatigue, no subjective hyperthermia, no subjective hypothermia Eyes: no blurry vision, no xerophthalmia ENT: no sore throat, + see HPI Cardiovascular: no CP/no SOB/no palpitations/no leg swelling Respiratory: no cough/no SOB/no wheezing Gastrointestinal: no N/no V/no D/no C/no acid reflux Musculoskeletal: no muscle aches/no joint aches Skin: no rashes, no hair loss Neurological: no tremors/no numbness/no tingling/no dizziness  I reviewed pt's medications, allergies, PMH, social hx, family hx, and changes were documented in the history of present illness. Otherwise, unchanged from my initial visit note.  Past Medical History:  Diagnosis Date  . Anemia   . Cancer ([place])    multiple myeloma, thyroid cancer   . Glaucoma   . HYPERTENSION 03/11/2007  . Multiple myeloma ([place])   . OSTEOPENIA 03/11/2007  . Pre-diabetes    Past Surgical History:  Procedure Laterality Date  . BONE MARROW BIOPSY    . BREAST EXCISIONAL BIOPSY Right 2000  . BREAST LUMPECTOMY  1990   benign  . CATARACT EXTRACTION Bilateral 2018  . DILATION AND CURETTAGE OF UTERUS     bleeding at menopause. No uterine cancer  . IR RADIOLOGIST EVAL & MGMT  12/13/2018  . THYROIDECTOMY N/A 08/02/2020   Procedure: TOTAL THYROIDECTOMY;  Surgeon: [name], MD;  Location: [facility];  Service: General;  Laterality: N/A;  . TONSILLECTOMY     age 38   Social History   Socioeconomic History  . Marital status: Married    Spouse name: Not on file  . Number of children: 1  . Years of education: Not on file  . Highest education level: Not on file  Occupational  History  . Occupation: Retired  Tobacco Use  . Smoking status: Former Smoker    Packs/day: 0.50    Years: 7.00    Pack years: 3.50    Types: Cigarettes    Quit date: 01/04/1961    Years since quitting: 60.0  . Smokeless tobacco: Never Used  Vaping Use  . Vaping Use: Never used  Substance and Sexual Activity  . Alcohol use: Yes    Alcohol/week: 1.0 standard drink    Types: 1 Standard drinks or equivalent per week    Comment: occas   . Drug use: No  . Sexual activity: Not Currently  Other Topics Concern  . Not on file  Social History Narrative   Married. Lives with husband (patient of Dr. Yong Channel). 1 son. No grandkids. 1 granddog.       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 acyclovir (ZOVIRAX) 400 MG tablet Take 1 tablet (400 mg total) by mouth 2 (two) times daily. 60 tablet 3  . ALPHAGAN P 0.1 % SOLN Place 1 drop into both eyes in the morning, at noon, and at bedtime.     Marland Kitchen amLODipine (NORVASC) 5 MG tablet Take 1 tablet (5 mg total) by mouth daily. (Patient taking differently: Take 5 mg by mouth at bedtime. ) 90 tablet 3  . aspirin EC 81 MG tablet Take 81 mg by mouth daily.    . bimatoprost (LUMIGAN) 0.03 % ophthalmic solution Place 1 drop into both eyes at bedtime.     . calcium carbonate (TUMS) 500 MG chewable tablet Chew 2 tablets (400 mg of elemental calcium total) by mouth 2 (two) times daily. 90 tablet 1  . Cholecalciferol (VITAMIN D3) 125 MCG (5000 UT) TABS Take 5,000 Units by mouth daily.     Marland Kitchen  Co-Enzyme Q-10 100 MG CAPS Take 100 mg by mouth daily.     . dorzolamide-timolol (COSOPT) 22.3-6.8 MG/ML ophthalmic solution Place 1 drop into both eyes 2 (two) times daily.   11  . fentaNYL (DURAGESIC) 12 MCG/HR Place 1 patch onto the skin every 3 (three) days. 10 patch 0  . hydrOXYzine (ATARAX/VISTARIL) 25 MG tablet Take 1 tablet (25 mg total) by mouth 3 (three) times daily as needed. 30 tablet 0  . ixazomib citrate (NINLARO) 3 MG capsule TAKE 1 CAP (3 MG) BY MOUTH WEEKLY, 3 WEEKS ON, 1 WEEK OFF, REPEAT EVERY 4 WEEKS. TAKE ON AN EMPTY STOMACH 1HR BEFORE OR 2HR AFTER MEAL 3 capsule 2  . levothyroxine (SYNTHROID) 75 MCG tablet Take 1 tablet (75 mcg total) by mouth daily before breakfast. 45 tablet 3  . magnesium hydroxide (MILK OF MAGNESIA) 400 MG/5ML suspension Take 15 mLs by mouth daily as needed for mild constipation or moderate constipation. 355 mL 0  . Multiple Vitamins-Minerals (MULTIVITAMIN WITH MINERALS) tablet Take 1 tablet by mouth daily. Centrum Silver 50 +    . Omega-3 1000 MG CAPS Take 1,000 mg by mouth daily.     Marland Kitchen oxyCODONE-acetaminophen (PERCOCET) 5-325 MG tablet Take 1-2 tablets by mouth every 4 (four) hours as needed for moderate pain or severe pain. 90 tablet 0  . polyethylene glycol (MIRALAX) packet Take 17 g by mouth daily. 30 each 1  . senna-docusate (SENOKOT-S) 8.6-50 MG tablet Take 2 tablets by mouth at bedtime. (Patient taking differently: Take 1 tablet by mouth at bedtime. ) 60 tablet 2  . simethicone (MYLICON) 80 MG chewable tablet Chew 80 mg by mouth every evening.     . vitamin C (ASCORBIC ACID) 500 MG tablet Take 500 mg by mouth 2 (two) times a week.     . [DISCONTINUED] prochlorperazine (COMPAZINE) 10 MG tablet Take 1 tablet (10 mg total) by mouth every 6 (six) hours as needed (Nausea or vomiting). 30 tablet 1   No current facility-administered medications on file prior to visit.   Allergies  Allergen Reactions  . Ace Inhibitors Cough  . Benadryl [Diphenhydramine]  Other (See Comments)    Heart races  . Diamox [Acetazolamide]     Hypotensive event at ophthalmologist  . Sulfamethoxazole     REACTION: rash  . Lenalidomide Rash  . Penicillins Rash    REACTION: rash Did it involve swelling of the face/tongue/throat, SOB, or low BP? Yes Did it involve sudden or severe rash/hives, skin peeling, or any reaction on the inside of your mouth  or nose? No Did you need to seek medical attention at a hospital or doctor's office? No When did it last happen?more than 10 years ago If all above answers are "NO", may proceed with cephalosporin use.    Family History  Problem Relation Age of Onset  . Heart disease Mother        CHF mother died 49  . Arthritis Mother   . Glaucoma Mother        sister as well  . Alcohol abuse Father   . Suicidality Father   . Cancer Sister        lung cancer, smoker  . Heart disease Sister        aortic valve replacement  . Hyperlipidemia Brother   . Hypertension Brother   . COPD Brother   . Arthritis Sister   . Hypertension Sister   . Glaucoma Sister   . Hashimoto's thyroiditis Sister   . Hypertension Son   . Stroke Maternal Grandmother   . Colon cancer Neg Hx   . Colon polyps Neg Hx     PE: BP (!) 140/100 (BP Location: Right Arm, Patient Position: Sitting, Cuff Size: Normal)   Pulse 67   Ht $R'5\' 2"'uC$  (1.575 m)   Wt 109 lb 9.6 oz (49.7 kg)   SpO2 97%   BMI 20.05 kg/m  Wt Readings from Last 3 Encounters:  01/13/21 109 lb 9.6 oz (49.7 kg)  12/04/20 112 lb 1.6 oz (50.8 kg)  10/09/20 107 lb 8 oz (48.8 kg)   Constitutional: thin, in NAD Eyes: PERRLA, EOMI, no exophthalmos ENT: moist mucous membranes, no neck masses, thyroidectomy scar healed, no cervical lymphadenopathy Cardiovascular: RRR, No MRG Respiratory: CTA B Gastrointestinal: abdomen soft, NT, ND, BS+ Musculoskeletal: no deformities, strength intact in all 4 Skin: moist, warm, no rashes Neurological: no tremor with outstretched hands, DTR normal  in all 4  ASSESSMENT: 1.  Papillary thyroid cancer, classic variant  2.  Postsurgical hypothyroidism  3.  Prediabetes  PLAN: 1. Thyroid cancer - papillary -Patient with history of clinically stage I TNM thyroid cancer, with lower clinical risk due to tumor size smaller than 4 cm, no lymphovascular invasion, no atypical features (except for some calcifications and hyalinization-possibly associated with slightly higher risk) and lack of extrathyroidal extension. -PTC is a cancer with good prognosis; her life expectancy and quality of life are unlikely to be reduced by the cancer -Due to the low-intermediate risk of the tumor, RAI treatment for postop thyroid remnant ablation is not mandatory.  We discussed that the main goal of this is to facilitate monitoring in the long run, by checking thyroglobulin.  However, she continues chemotherapy for multiple myeloma and for now, I would be better to hold off RAI treatment.  We discussed that if you proceed without RAI treatment for now, we can revisit this decision later and perform the RAI at that time, which was not worsening her prognosis. -Usually check thyroglobulin and ATA antibodies to follow trends, even though her thyroglobulin is less likely to be undetectable in the absence of RAI treatment.  At last visit, this was 0.4 (09/2020).  We will recheck at next visit. - I will see the patient in 6 months  2.  Patient with history of total thyroidectomy for thyroid cancer, now with iatrogenic hypothyroidism, on levothyroxine therapy - latest thyroid labs reviewed with pt >> normal: Lab Results  Component Value Date   TSH 1.96 10/30/2020   - she continues on LT4 75 mcg  daily - pt feels good on this dose. - we discussed about taking the thyroid hormone every day, with water, >30 minutes before breakfast, separated by >4 hours from acid reflux medications, calcium, iron, multivitamins. Pt. is taking it correctly. - will check thyroid tests in ~2  months: TSH and fT4 - If labs are abnormal, she will need to return for repeat TFTs in 1.5 months  2.  Prediabetes -Mild, latest HbA1c 5.9% -She continues to adjust and improve her diet -At today's visit, we checked another HbA1c: 5.9% (stable) -We will continue to follow her without medications for now  [name], MD PhD [facility] Endocrinology

## 2021-01-13 NOTE — Patient Instructions (Addendum)
Please continue Levothyroxine 75 mcg daily.  Take the thyroid hormone every day, with water, at least 30 minutes before breakfast, separated by at least 4 hours from: - acid reflux medications - calcium - iron - multivitamins   Try to have another set of thyroid tests checked in June.  Please return in 6 months.

## 2021-01-20 ENCOUNTER — Telehealth: Payer: Self-pay | Admitting: Internal Medicine

## 2021-01-20 DIAGNOSIS — C73 Malignant neoplasm of thyroid gland: Secondary | ICD-10-CM

## 2021-01-20 MED ORDER — LEVOTHYROXINE SODIUM 75 MCG PO TABS: 75.0000 ug | ORAL_TABLET | Freq: Every day | ORAL | 3 refills | Status: DC

## 2021-01-20 NOTE — Telephone Encounter (Signed)
Patient calling back following up on her call from this morning.

## 2021-01-20 NOTE — Telephone Encounter (Signed)
Rx released to the pharmacy

## 2021-01-20 NOTE — Telephone Encounter (Signed)
Pt states that she called pharmacy for her 90 days script of levothyroxine (SYNTHROID) 75 MCG tablet and pharmacy is stating that they have not received it. Patient needs it today.   [place], [place] - [address]

## 2021-01-22 ENCOUNTER — Other Ambulatory Visit: Payer: Self-pay

## 2021-01-22 ENCOUNTER — Inpatient Hospital Stay: Payer: Medicare HMO | Attending: Hematology

## 2021-01-22 DIAGNOSIS — C9000 Multiple myeloma not having achieved remission: Secondary | ICD-10-CM

## 2021-01-22 DIAGNOSIS — Z7982 Long term (current) use of aspirin: Secondary | ICD-10-CM | POA: Diagnosis not present

## 2021-01-22 DIAGNOSIS — M8580 Other specified disorders of bone density and structure, unspecified site: Secondary | ICD-10-CM | POA: Diagnosis not present

## 2021-01-22 DIAGNOSIS — M549 Dorsalgia, unspecified: Secondary | ICD-10-CM | POA: Diagnosis not present

## 2021-01-22 DIAGNOSIS — D61818 Other pancytopenia: Secondary | ICD-10-CM | POA: Diagnosis not present

## 2021-01-22 DIAGNOSIS — I10 Essential (primary) hypertension: Secondary | ICD-10-CM | POA: Diagnosis not present

## 2021-01-22 DIAGNOSIS — Z87891 Personal history of nicotine dependence: Secondary | ICD-10-CM | POA: Diagnosis not present

## 2021-01-22 DIAGNOSIS — E041 Nontoxic single thyroid nodule: Secondary | ICD-10-CM | POA: Diagnosis not present

## 2021-01-22 DIAGNOSIS — K5900 Constipation, unspecified: Secondary | ICD-10-CM | POA: Insufficient documentation

## 2021-01-22 DIAGNOSIS — Z79899 Other long term (current) drug therapy: Secondary | ICD-10-CM | POA: Insufficient documentation

## 2021-01-22 DIAGNOSIS — I700 Atherosclerosis of aorta: Secondary | ICD-10-CM | POA: Diagnosis not present

## 2021-01-22 DIAGNOSIS — Z5111 Encounter for antineoplastic chemotherapy: Secondary | ICD-10-CM

## 2021-01-22 LAB — CBC WITH DIFFERENTIAL/PLATELET
Abs Immature Granulocytes: 0.01 10*3/uL (ref 0.00–0.07)
Basophils Absolute: 0.1 10*3/uL (ref 0.0–0.1)
Basophils Relative: 1 %
Eosinophils Absolute: 0.1 10*3/uL (ref 0.0–0.5)
Eosinophils Relative: 1 %
HCT: 36.2 % (ref 36.0–46.0)
Hemoglobin: 11.8 g/dL — ABNORMAL LOW (ref 12.0–15.0)
Immature Granulocytes: 0 %
Lymphocytes Relative: 8 %
Lymphs Abs: 0.4 10*3/uL — ABNORMAL LOW (ref 0.7–4.0)
MCH: 33.3 pg (ref 26.0–34.0)
MCHC: 32.6 g/dL (ref 30.0–36.0)
MCV: 102.3 fL — ABNORMAL HIGH (ref 80.0–100.0)
Monocytes Absolute: 0.7 10*3/uL (ref 0.1–1.0)
Monocytes Relative: 15 %
Neutro Abs: 3.3 10*3/uL (ref 1.7–7.7)
Neutrophils Relative %: 75 %
Platelets: 212 10*3/uL (ref 150–400)
RBC: 3.54 MIL/uL — ABNORMAL LOW (ref 3.87–5.11)
RDW: 13.8 % (ref 11.5–15.5)
WBC: 4.4 10*3/uL (ref 4.0–10.5)
nRBC: 0 % (ref 0.0–0.2)

## 2021-01-22 LAB — CMP (CANCER CENTER ONLY)
ALT: 14 U/L (ref 0–44)
AST: 18 U/L (ref 15–41)
Albumin: 4.2 g/dL (ref 3.5–5.0)
Alkaline Phosphatase: 34 U/L — ABNORMAL LOW (ref 38–126)
Anion gap: 8 (ref 5–15)
BUN: 14 mg/dL (ref 8–23)
CO2: 29 mmol/L (ref 22–32)
Calcium: 9.2 mg/dL (ref 8.9–10.3)
Chloride: 105 mmol/L (ref 98–111)
Creatinine: 0.72 mg/dL (ref 0.44–1.00)
GFR, Estimated: >60 mL/min (ref >60)
Glucose, Bld: 79 mg/dL (ref 70–99)
Potassium: 3.9 mmol/L (ref 3.5–5.1)
Sodium: 142 mmol/L (ref 135–145)
Total Bilirubin: 0.4 mg/dL (ref 0.3–1.2)
Total Protein: 7 g/dL (ref 6.5–8.1)

## 2021-01-22 MED ORDER — OXYCODONE-ACETAMINOPHEN 5-325 MG PO TABS: 1.0000 | ORAL_TABLET | ORAL | 0 refills | Status: DC | PRN

## 2021-01-22 MED ORDER — FENTANYL 12 MCG/HR TD PT72: 1.0000 | MEDICATED_PATCH | TRANSDERMAL | 0 refills | Status: DC

## 2021-01-23 LAB — KAPPA/LAMBDA LIGHT CHAINS
Kappa free light chain: 11.2 mg/L (ref 3.3–19.4)
Kappa, lambda light chain ratio: 0.43 (ref 0.26–1.65)
Lambda free light chains: 26.1 mg/L (ref 5.7–26.3)

## 2021-01-24 LAB — MULTIPLE MYELOMA PANEL, SERUM
Albumin SerPl Elph-Mcnc: 3.9 g/dL (ref 2.9–4.4)
Albumin/Glob SerPl: 1.6 (ref 0.7–1.7)
Alpha 1: 0.2 g/dL (ref 0.0–0.4)
Alpha2 Glob SerPl Elph-Mcnc: 0.8 g/dL (ref 0.4–1.0)
B-Globulin SerPl Elph-Mcnc: 1.2 g/dL (ref 0.7–1.3)
Gamma Glob SerPl Elph-Mcnc: 0.5 g/dL (ref 0.4–1.8)
Globulin, Total: 2.6 g/dL (ref 2.2–3.9)
IgA: 32 mg/dL — ABNORMAL LOW (ref 64–422)
IgG (Immunoglobin G), Serum: 800 mg/dL (ref 586–1602)
IgM (Immunoglobulin M), Srm: 28 mg/dL (ref 26–217)
M Protein SerPl Elph-Mcnc: 0.4 g/dL — ABNORMAL HIGH
Total Protein ELP: 6.5 g/dL (ref 6.0–8.5)

## 2021-01-27 ENCOUNTER — Encounter: Payer: Self-pay | Admitting: Family Medicine

## 2021-01-27 ENCOUNTER — Other Ambulatory Visit (HOSPITAL_COMMUNITY): Payer: Self-pay

## 2021-01-27 ENCOUNTER — Ambulatory Visit (INDEPENDENT_AMBULATORY_CARE_PROVIDER_SITE_OTHER): Payer: Medicare HMO | Admitting: Family Medicine

## 2021-01-27 VITALS — BP 128/81 | HR 70 | Temp 98.4°F | Ht 62.0 in | Wt 109.8 lb

## 2021-01-27 DIAGNOSIS — Z0000 Encounter for general adult medical examination without abnormal findings: Secondary | ICD-10-CM | POA: Diagnosis not present

## 2021-01-27 DIAGNOSIS — Z1159 Encounter for screening for other viral diseases: Secondary | ICD-10-CM

## 2021-01-27 DIAGNOSIS — I10 Essential (primary) hypertension: Secondary | ICD-10-CM | POA: Diagnosis not present

## 2021-01-27 DIAGNOSIS — E785 Hyperlipidemia, unspecified: Secondary | ICD-10-CM | POA: Diagnosis not present

## 2021-01-27 DIAGNOSIS — R7303 Prediabetes: Secondary | ICD-10-CM

## 2021-01-27 DIAGNOSIS — R739 Hyperglycemia, unspecified: Secondary | ICD-10-CM

## 2021-01-27 MED ORDER — AMLODIPINE BESYLATE 5 MG PO TABS: 5.0000 mg | ORAL_TABLET | Freq: Every day | ORAL | 3 refills | Status: DC

## 2021-01-27 MED FILL — Ixazomib Citrate Cap 3 MG (Base Equivalent): ORAL | 28 days supply | Qty: 3 | Fill #0 | Status: AC

## 2021-01-27 NOTE — Progress Notes (Signed)
Phone [phone]   Subjective:  Patient presents today for their annual physical. Chief complaint-noted.   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 Anemia   . Cancer ([place])    multiple myeloma, thyroid cancer   . Glaucoma   . HYPERTENSION 03/11/2007  . Multiple myeloma ([place])   . OSTEOPENIA 03/11/2007  . Pre-diabetes    Patient Active Problem List   Diagnosis Date Noted  . 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 Left thyroid nodule 06/18/2020    Priority: High  . Multiple myeloma not having achieved remission ([place]) 12/12/2018    Priority: High  . Pain in thoracic spine 11/03/2018    Priority: Medium  . Hyperglycemia 03/29/2015    Priority: Medium  . Hyperlipidemia 10/18/2014    Priority: Medium  . Essential hypertension 03/11/2007    Priority: Medium  . Counseling regarding advance care planning and goals of care 12/12/2018    Priority: Low  . History of adenomatous polyp of colon 07/23/2015    Priority: Low  . Raynaud phenomenon 10/19/2014    Priority: Low  . Syncope 10/19/2014    Priority: Low  . Glaucoma 01/05/2011    Priority: Low  . Osteoporosis 03/11/2007    Priority: Low  . Prediabetes 06/18/2020  . Primary open angle glaucoma (POAG) of right eye, moderate stage 10/11/2017  . Combined forms of age-related cataract of left eye 04/28/2017   Past Surgical History:  Procedure Laterality Date  . BONE MARROW BIOPSY    . BREAST EXCISIONAL BIOPSY Right 2000  . BREAST LUMPECTOMY  1990   benign  . CATARACT EXTRACTION Bilateral 2018  . DILATION AND CURETTAGE OF UTERUS     bleeding at menopause. No uterine cancer  . IR  RADIOLOGIST EVAL & MGMT  12/13/2018  . THYROIDECTOMY N/A 08/02/2020   Procedure: TOTAL THYROIDECTOMY;  Surgeon: [name], MD;  Location: [facility];  Service: General;  Laterality: N/A;  . TONSILLECTOMY     age 10    Family History  Problem Relation Age of Onset  . Heart disease Mother        CHF mother died 49  . Arthritis Mother   . Glaucoma Mother        sister as well  . Alcohol abuse Father   . Suicidality Father   . Cancer Sister        lung cancer, smoker  . Heart disease Sister        aortic valve replacement  . Hyperlipidemia Brother   . Hypertension Brother   . COPD Brother   . Arthritis Sister   . Hypertension Sister   . Glaucoma Sister   . Hashimoto's thyroiditis Sister   . Hypertension Son   . Stroke Maternal Grandmother   . Colon cancer Neg Hx   . Colon polyps Neg Hx     Medications- reviewed and updated Current Outpatient Medications  Medication Sig Dispense Refill  . acyclovir (ZOVIRAX) 400 MG tablet Take 1 tablet (400 mg total) by mouth 2 (two) times daily. 60 tablet 3  . ALPHAGAN P 0.1 % SOLN Place 1 drop into both eyes in the morning, at noon, and at bedtime.     Marland Kitchen  aspirin EC 81 MG tablet Take 81 mg by mouth daily.    . bimatoprost (LUMIGAN) 0.03 % ophthalmic solution Place 1 drop into both eyes at bedtime.     . Cholecalciferol (VITAMIN D3) 125 MCG (5000 UT) TABS Take 5,000 Units by mouth daily.    Marland Kitchen Co-Enzyme Q-10 100 MG CAPS Take 100 mg by mouth daily.    . dorzolamide-timolol (COSOPT) 22.3-6.8 MG/ML ophthalmic solution Place 1 drop into both eyes 2 (two) times daily.   11  . fentaNYL (DURAGESIC) 12 MCG/HR Place 1 patch onto the skin every 3 (three) days. 10 patch 0  . ixazomib citrate (NINLARO) 3 MG capsule TAKE 1 CAP (3 MG) BY MOUTH WEEKLY, 3 WEEKS ON, 1 WEEK OFF, REPEAT EVERY 4 WEEKS. TAKE ON AN EMPTY STOMACH 1HR BEFORE OR 2HR AFTER MEAL (Patient taking differently: TAKE 1 CAP (3 MG) BY MOUTH WEEKLY, 3 WEEKS ON, 1 WEEK OFF, REPEAT EVERY 4 WEEKS.  TAKE ON AN EMPTY STOMACH 1HR BEFORE OR 2HR AFTER MEAL) 3 capsule 2  . levothyroxine (SYNTHROID) 75 MCG tablet Take 1 tablet (75 mcg total) by mouth daily before breakfast. 90 tablet 3  . Multiple Vitamins-Minerals (MULTIVITAMIN WITH MINERALS) tablet Take 1 tablet by mouth daily. Centrum Silver 50 +    . Omega-3 1000 MG CAPS Take 1,000 mg by mouth daily.     Marland Kitchen oxyCODONE-acetaminophen (PERCOCET) 5-325 MG tablet Take 1-2 tablets by mouth every 4 (four) hours as needed for moderate pain or severe pain. 90 tablet 0  . polyethylene glycol (MIRALAX) packet Take 17 g by mouth daily. 30 each 1  . simethicone (MYLICON) 80 MG chewable tablet Chew 80 mg by mouth every evening.     . vitamin C (ASCORBIC ACID) 500 MG tablet Take 500 mg by mouth 2 (two) times a week.     Marland Kitchen amLODipine (NORVASC) 5 MG tablet Take 1 tablet (5 mg total) by mouth at bedtime. 90 tablet 3  . hydrOXYzine (ATARAX/VISTARIL) 25 MG tablet Take 1 tablet (25 mg total) by mouth 3 (three) times daily as needed. (Patient not taking: Reported on 01/27/2021) 30 tablet 0  . senna-docusate (SENOKOT-S) 8.6-50 MG tablet Take 2 tablets by mouth at bedtime. (Patient not taking: Reported on 01/27/2021) 60 tablet 2   No current facility-administered medications for this visit.    Allergies-reviewed and updated Allergies  Allergen Reactions  . Ace Inhibitors Cough  . Benadryl [Diphenhydramine] Other (See Comments)    Heart races  . Diamox [Acetazolamide]     Hypotensive event at ophthalmologist  . Sulfamethoxazole     REACTION: rash  . Lenalidomide Rash  . Penicillins Rash    REACTION: rash Did it involve swelling of the face/tongue/throat, SOB, or low BP? Yes Did it involve sudden or severe rash/hives, skin peeling, or any reaction on the inside of your mouth or nose? No Did you need to seek medical attention at a hospital or doctor's office? No When did it last happen?more than 10 years ago If all above answers are "NO", may proceed with  cephalosporin use.     Social History   Social History Narrative   Married. Lives with husband (patient of Dr. Yong Channel). 1 son. No grandkids. 1 granddog.       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 Oropharynx normal Neck: no thyromegaly CV: RRR no murmurs rubs or gallops Lungs: CTAB no crackles, wheeze, rhonchi Abdomen: soft/nontender/nondistended/normal bowel sounds. No rebound or guarding.  Ext: no edema Skin: warm, dry Neuro: grossly normal, moves all extremities, PERRLA   Assessment and Plan   80 y.o. female presenting for annual physical.  Health Maintenance counseling: 1. Anticipatory guidance: Patient counseled regarding regular dental exams - yearly at this point- trying to get scheduled- normally q6 months, eye exams - regular with glaucoma,  avoiding smoking and second hand smoke , limiting alcohol to 1 beverage per day- no alcohol at present .   2. Risk factor reduction:  Advised patient of need for regular exercise and diet rich and fruits and vegetables to reduce risk of heart attack and stroke. Exercise- walking most days still or pushing lawnmower or vacuuming. Diet-  Slight weight gain from last physical which is welcome- eating reasonably healthy -she may reach out about PT referral if needed to help with exercises  Wt Readings from Last 3 Encounters:  01/27/21 109 lb 12.8 oz (49.8 kg)  01/13/21 109 lb 9.6 oz (49.7 kg)  12/04/20 112 lb 1.6 oz (50.8 kg)  3. Immunizations/screenings/ancillary studies-fully up-to-date other than Prevnar 20 and tetanus immunization-discussed  this today- wants to check with oncology.  Hepatitis C screening with labs today. Immunization History  Administered Date(s) Administered  . Fluad Quad(high Dose 65+) 06/13/2019, 07/03/2020  . Influenza Split 07/19/2012  . Influenza Whole 07/17/2008, 06/28/2009, 06/25/2010  . Influenza, High Dose Seasonal PF 07/22/2016, 06/24/2017, 06/23/2018  . Influenza,inj,Quad PF,6+ Mos 06/27/2013, 06/18/2014  . Influenza-Unspecified 07/03/2015  . PFIZER(Purple Top)SARS-COV-2 Vaccination 11/09/2019, 12/04/2019, 06/03/2020, 01/09/2021  . Pneumococcal Conjugate-13 03/29/2015  . Pneumococcal Polysaccharide-23 04/04/2006  . Td 02/10/2010  . Zoster 09/26/2008  . Zoster Recombinat (Shingrix) 03/27/2020, 09/06/2020  4. Cervical cancer screening- past age based screening recommendations 5. Breast cancer screening-  breast exam-prefer self exams-and mammogram - she is considering still 6. Colon cancer screening - 07/16/2015 last colonoscopy with original plan for 5-year follow-up- due to multiple myeloma we have opted to hold off on repeat previously- some thinner stools and some RLQ discomfort at times and has visit with Dr. Fuller Plan 7. Skin cancer screening-saw Dr. Delman Cheadle- lesion removed and was benign thankfully.advised regular sunscreen use. Denies worrisome, changing, or new skin lesions.  8. Birth control/STD check-monogamous/postmenopausal 9. Osteoporosis screening at 52- managed by oncology for osteoporosis. zometa infusions at least 2-3 years planned -Former smoker-no regular screening is quit smoking over 50 years ago  Status of chronic or acute concerns  #Multiple myeloma-doing well on maintenance medication. m protein up slightly to 0.4 from 0.3- has upcomign visit with Dr. Irene Limbo  #Chronic back pain- on  Half tablet 3 x a dayoxycodone-acetaminophenThrough oncology-cancer-related compression fractures and resulting chronic back pain  #Thyroid cancer papillary- thyroidectomy with [facility] [place]  surgery-Dr. Harlow Asa.  On levothyroxine 75 mcg-update TSH with labs.  Stable in January.  Continues follow-up with Dr. Cruzita Lederer- had to reduce dose due to some diarrhea.  Lab Results  Component Value Date   TSH 1.96 10/30/2020   #Macrocytosis- b12 has been normal - discussed folate rbc and b12 check but could just be related to multiple  myeloma meds- she prefers to hold off on repeat for now  #hypertension with whitecoat hypertension S: medication: Amlodipine 5Mg  at bedtime Home readings #s: 110s to 120s/70s to low 80s- peak 82 BP Readings from Last 3 Encounters:  01/27/21 128/81- home reading from yesterday  01/13/21 (!) 140/100  12/04/20 (!) 162/82  A/P: Excellent control at home-continues to have whitecoat hypertension in office-continue just amlodipine alone at bedtime  #hyperlipidemia S: Medication:None Lab Results  Component Value Date   CHOL 220 (H) 01/26/2020   HDL 68 01/26/2020   LDLCALC 132 (H) 01/26/2020   TRIG 97 01/26/2020   CHOLHDL 3.2 01/26/2020   A/P: With age and comorbid conditions we have not opted to have her take cholesterol medicine-just had labs and we opted out of checking today  # Hyperglycemia/insulin resistance/prediabetes S:  Medication: none. See above on walking/exercise Lab Results  Component Value Date   HGBA1C 5.9 (A) 01/13/2021   HGBA1C 5.9 (A) 06/18/2020   HGBA1C 5.8 (H) 01/26/2020   A/P:  controlled when recently checked 2 weeks ago-hold off on repeat check today  Recommended awv- she wants to hold off  Recommended follow up: Return in about 1 year (around 01/27/2022) for physical or sooner if needed. Future Appointments  Date Time Provider [place]  01/29/2021  9:00 AM [name], MD [facility] None  01/29/2021 10:00 AM [facility] None  02/20/2021  1:30 PM Ladene Artist, MD [facility]  03/17/2021  9:00 AM [facility] LAB [facility] None  06/16/2021  9:00 AM [name], MD [facility]  None    Lab/Order associations: no  Labs today   ICD-10-CM   1. Preventative health care  Z00.00   2. Essential hypertension  I10   3. Hyperlipidemia, unspecified hyperlipidemia type  E78.5   4. Hyperglycemia  R73.9   5. Prediabetes  R73.03   6. Encounter for hepatitis C screening test for low risk patient  Z11.59     Meds ordered this encounter  Medications  . amLODipine (NORVASC) 5 MG tablet    Sig: Take 1 tablet (5 mg total) by mouth at bedtime.    Dispense:  90 tablet    Refill:  3    Return precautions advised.  Garret Reddish, MD

## 2021-01-27 NOTE — Patient Instructions (Addendum)
  Health Maintenance Due  Topic Date Due  . Hepatitis C Screening - if we do future bloodwork- holding off right now Never done  . COLONOSCOPY- discuss with Dr. Irene Limbo and Dr. Fuller Plan 07/15/2020   Double check with Dr. Irene Limbo on  1. Tetanus shot at pharmacy 2. Prevnar 20- new pneumonia shot which just became recommended this year.   You are eligible to schedule your annual wellness visit with our nurse specialist Otila Kluver.  Please consider scheduling this before you leave today  Recommended follow up: Return in about 1 year (around 01/27/2022) for physical or sooner if needed.

## 2021-01-28 NOTE — Progress Notes (Signed)
HEMATOLOGY/ONCOLOGY CLINIC NOTE  Date of Service: 01/29/2021  Patient Care Team: Shelva Majestic, MD as PCP - General (Family Medicine) Johney Maine, MD as Consulting Physician (Hematology) Bond, Doran Stabler, MD as Referring Physician (Ophthalmology) Zigmund Daniel, Maureen Ralphs, MD as Consulting Physician (Hematology and Oncology) Carlus Pavlov, MD as Consulting Physician (Internal Medicine) Darnell Level, MD as Consulting Physician (General Surgery)  Dr. Rhett Bannister as [place] Specialist  [phone]  CHIEF COMPLAINTS/PURPOSE OF CONSULTATION:   Multiple Myeloma- continued management  HISTORY OF PRESENTING ILLNESS:   Megan Olson is a wonderful 80 y.o. female who has been referred to [place] by Dr. 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 She is accompanied today by her husband. The pt reports that she is doing well overall.   The pt notes that she was doing extensive yard work in October 2019, and began to feel back pain a few days after this, which she attributed to muscle pain. She recalls taking a deep breath, and developing sudden acute pain, and presented to care with her PCP's office. She began exercises for her back pain, without relief, then began PT without relief, then was referred to Dr. Berline Chough in sports medicine in late January 2020. She had an XR which revealed a compression fracture at T11, then subsequently had an MRI, and a bone marrow biopsy. The pt notes that her back pain "seemed to move around." She endorses pain "up the whole left side" of her back.  The pt notes that she is not able to stand up straight due to her back pain, which she feels limits her ability to take a deep breath, and endorses pain exacerbation when she does take a deep breath. The pt needs assistance from sitting to lying from her husband. She is using about 3 Percocet a day.  The pt notes worsened constipation since  beginning Percocet, and notes that her most recent laxative order was not covered by her insurance. She is using prune juice and milk of magnesia. She took Senokot S BID without relief.  The pt reports that prior to her recent back pain, she had very few medical concerns. She endorses controlled BP, and has been monitored for DM but after closely watching her diet her A1C decreased to 5.8. She has glaucoma, and has had surgery in both eyes. Right eye with stent. The pt sees Dr. Lottie Dawson at [facility] for her eye care. The pt notes that her vision has been recently "pretty good." The pt notes that she has been advised to limit treatment with steroids for her glaucoma. She denies heart or lung problems. Denies previous back problems. Last DEXA scan 3 years ago, and endorses osteopenia. She notes that she took Fosamax for 3-4 years, and stopped 5-6 years ago. She takes Vitamin D, a multivitamin, and magnesium.  The pt notes that she quit smoking cigarettes when she was 27, started when she was about 20. The pt consumes alcohol rarely, but not since beginning narcotics. 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  Of note prior to the patient's visit today, pt has had an MRI Thoracic Spine completed on 11/27/18 with results revealing Multifocal marrow signal abnormality consistent with metastatic disease or multiple myeloma. The patient has multiple compression fractures most consistent with pathologic injuries. Extensive marrow signal abnormality makes determining age of the fractures difficult but edema is most intense in T3. Epidural tumor centrally and to the left posterior to T3  extends into the left neural foramen and could impact the left T3 root.  Most recent lab results (12/01/18) of CBC w/diff is as follows: all values are WNL except for RBC at 3.17, HGB at 10.4, HCT at 33.5, MCV at 105.7. 11/28/18 CMP revealed all values WNL except for Glucose at 105, Total Protein at 10.2, Albumin at 3.1 11/30/18 24HR  UPEP revealed all values WNL except for M-spike at 75.1%. 11/28/18 Beta-2 microglobulin slightly elevated at 2.6 11/28/18 SPEP revealed M spike at 4.6g 11/28/18 Immunoglobulins revealed IgG at 6181, IgA at 24, IgM at 16, and IgE at 6.  On review of systems, pt reports constipation, back pain, stable energy levels, ankle swelling, tenderness at T3, lower back pain, and denies abdominal pains, neck pain, and any other symptoms.   On PMHx the pt reports redundant colon, single hemorrhoid, glaucoma, HTN, osteopenia, Tonsillectomy, right eye stent and multiple eye surgeries. On Social Hx the pt reports rare alcohol use, smoked cigarettes between ages 45 and 69. 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 On Family Hx the pt reports sister died from small cell lung cancer and was a lifelong smoker, brother's daughter died of breast cancer with BRCA1 and BRCA2 mutations. Cousin with female breast cancer.  INTERVAL HISTORY:   Megan Olson returns today for management and evaluation of her multiple myeloma. The patient's last visit with [place] was on 12/04/2020. The pt reports that she is doing well overall.  The pt reports no new symptoms. The pt notes new concerns over seeing Dr. Fuller Plan for GI for a colonoscopy. She has been experiencing unusual bowel movements that are thinner than normal, but not to extent of diarrhea. The pt notes a tender spot in the lower abdomen. The pt notes this appointment is on May 19.  The pt also notes that she will be having eye surgery due to increased glaucoma pressured despite all other treatments and measures she has been doing. The pt mentioned laser surgery on each eye or a change in medication again. The pt's next appt is in June.  Lab results 01/22/2021 of CBC w/diff and CMP is as follows: all values are WNL except for RBC of 3.54, Hgb of 11.8, MCV of 102.3, Lymphs Abs of 0.4K, Alkaline Phosphatase of 34. 01/22/2021 Light Chains all WNL. 01/22/2021 MMP WNL except m-protein of  0.4.  On review of systems, pt reports looser stools, intermittent lower abdominal tenderness and denies leg swelling, back pain, cramping while bowel movement, diarrhea, constipation, and any other symptoms.  MEDICAL HISTORY:  Past Medical History:  Diagnosis Date  . Anemia   . Cancer ([place])    multiple myeloma, thyroid cancer   . Glaucoma   . HYPERTENSION 03/11/2007  . Multiple myeloma ([place])   . OSTEOPENIA 03/11/2007  . Pre-diabetes     SURGICAL HISTORY: Past Surgical History:  Procedure Laterality Date  . BONE MARROW BIOPSY    . BREAST EXCISIONAL BIOPSY Right 2000  . BREAST LUMPECTOMY  1990   benign  . CATARACT EXTRACTION Bilateral 2018  . DILATION AND CURETTAGE OF UTERUS     bleeding at menopause. No uterine cancer  . IR RADIOLOGIST EVAL & MGMT  12/13/2018  . THYROIDECTOMY N/A 08/02/2020   Procedure: TOTAL THYROIDECTOMY;  Surgeon: [name], MD;  Location: [facility];  Service: General;  Laterality: N/A;  . TONSILLECTOMY     age 38    SOCIAL HISTORY: Social History   Socioeconomic History  . Marital status: Married    Spouse  name: Not on file  . Number of children: 1  . Years of education: Not on file  . Highest education level: Not on file  Occupational History  . Occupation: Retired  Tobacco Use  . Smoking status: Former Smoker    Packs/day: 0.50    Years: 7.00    Pack years: 3.50    Types: Cigarettes    Quit date: 01/04/1961    Years since quitting: 60.1  . Smokeless tobacco: Never Used  Vaping Use  . Vaping Use: Never used  Substance and Sexual Activity  . Alcohol use: Yes    Alcohol/week: 1.0 standard drink    Types: 1 Standard drinks or equivalent per week    Comment: occas   . Drug use: No  . Sexual activity: Not Currently  Other Topics Concern  . Not on file  Social History Narrative   Married. Lives with husband (patient of Dr. Yong Channel). 1 son. No grandkids. 1 granddog.       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 Heart disease Mother        CHF mother died 2  . Arthritis Mother   . Glaucoma Mother        sister as well  . Alcohol abuse Father   . Suicidality Father   . Cancer Sister        lung cancer, smoker  . Heart disease Sister        aortic valve replacement  . Hyperlipidemia Brother   . Hypertension Brother   . COPD Brother   . Arthritis Sister   . Hypertension Sister   . Glaucoma Sister   . Hashimoto's thyroiditis Sister   . Hypertension Son   . Stroke Maternal Grandmother   . Colon cancer Neg Hx   . Colon polyps Neg Hx     ALLERGIES:  is allergic to ace inhibitors, benadryl [diphenhydramine], diamox [acetazolamide], sulfamethoxazole, lenalidomide, and penicillins.   MEDICATIONS:  Current Outpatient Medications  Medication Sig Dispense Refill  . acyclovir (ZOVIRAX) 400 MG tablet Take 1 tablet (400 mg total) by mouth 2 (two) times daily. 60 tablet 3  . ALPHAGAN P 0.1 % SOLN Place 1 drop into both eyes in the morning, at noon, and at bedtime.     Marland Kitchen amLODipine (NORVASC) 5 MG tablet Take 1 tablet (5 mg total) by mouth at bedtime. 90 tablet 3  . aspirin EC 81 MG tablet Take 81 mg by mouth daily.    . bimatoprost (LUMIGAN) 0.03 % ophthalmic solution Place 1 drop into both eyes at bedtime.     . Cholecalciferol (VITAMIN D3) 125 MCG (5000 UT) TABS Take 5,000 Units by mouth daily.    Marland Kitchen Co-Enzyme Q-10 100 MG CAPS Take 100 mg by mouth daily.    . dorzolamide-timolol (COSOPT) 22.3-6.8 MG/ML ophthalmic solution Place 1 drop into both eyes 2 (two) times daily.   11   . fentaNYL (DURAGESIC) 12 MCG/HR Place 1 patch onto the skin every 3 (three)  days. 10 patch 0  . hydrOXYzine (ATARAX/VISTARIL) 25 MG tablet Take 1 tablet (25 mg total) by mouth 3 (three) times daily as needed. (Patient not taking: Reported on 01/27/2021) 30 tablet 0  . ixazomib citrate (NINLARO) 3 MG capsule TAKE 1 CAP (3 MG) BY MOUTH WEEKLY, 3 WEEKS ON, 1 WEEK OFF, REPEAT EVERY 4 WEEKS. TAKE ON AN EMPTY STOMACH 1HR BEFORE OR 2HR AFTER MEAL (Patient taking differently: TAKE 1 CAP (3 MG) BY MOUTH WEEKLY, 3 WEEKS ON, 1 WEEK OFF, REPEAT EVERY 4 WEEKS. TAKE ON AN EMPTY STOMACH 1HR BEFORE OR 2HR AFTER MEAL) 3 capsule 2  . levothyroxine (SYNTHROID) 75 MCG tablet Take 1 tablet (75 mcg total) by mouth daily before breakfast. 90 tablet 3  . Multiple Vitamins-Minerals (MULTIVITAMIN WITH MINERALS) tablet Take 1 tablet by mouth daily. Centrum Silver 50 +    . Omega-3 1000 MG CAPS Take 1,000 mg by mouth daily.     Marland Kitchen oxyCODONE-acetaminophen (PERCOCET) 5-325 MG tablet Take 1-2 tablets by mouth every 4 (four) hours as needed for moderate pain or severe pain. 90 tablet 0  . polyethylene glycol (MIRALAX) packet Take 17 g by mouth daily. 30 each 1  . senna-docusate (SENOKOT-S) 8.6-50 MG tablet Take 2 tablets by mouth at bedtime. (Patient not taking: Reported on 01/27/2021) 60 tablet 2  . simethicone (MYLICON) 80 MG chewable tablet Chew 80 mg by mouth every evening.     . vitamin C (ASCORBIC ACID) 500 MG tablet Take 500 mg by mouth 2 (two) times a week.      No current facility-administered medications for this visit.    REVIEW OF SYSTEMS:   10 Point review of Systems was done is negative except as noted above.  PHYSICAL EXAMINATION: ECOG PERFORMANCE STATUS: 1-2  Vitals:   01/29/21 0858  BP: (!) 176/91  Pulse: 64  Resp: 18  Temp: 97.9 F (36.6 C)  SpO2: 99%   Filed Weights   01/29/21 0858  Weight: 110 lb 14.4 oz (50.3 kg)   Body mass index is 20.28 kg/m.    GENERAL:alert, in no acute distress and  comfortable SKIN: no acute rashes, no significant lesions EYES: conjunctiva are pink and non-injected, sclera anicteric OROPHARYNX: MMM, no exudates, no oropharyngeal erythema or ulceration NECK: supple, no JVD LYMPH:  no palpable lymphadenopathy in the cervical, axillary or inguinal regions LUNGS: clear to auscultation b/l with normal respiratory effort HEART: regular rate & rhythm ABDOMEN:  normoactive bowel sounds , non tender, not distended. Extremity: no pedal edema PSYCH: alert & oriented x 3 with fluent speech NEURO: no focal motor/sensory deficits   LABORATORY DATA:  I have reviewed the data as listed  . CBC Latest Ref Rng & Units 01/22/2021 11/27/2020 10/02/2020  WBC 4.0 - 10.5 K/uL 4.4 5.4 3.9(L)  Hemoglobin 12.0 - 15.0 g/dL 11.8(L) 12.3 12.0  Hematocrit 36.0 - 46.0 % 36.2 37.9 36.8  Platelets 150 - 400 K/uL 212 204 215    . CMP Latest Ref Rng & Units 01/22/2021 11/27/2020 10/02/2020  Glucose 70 - 99 mg/dL 79 102(H) 100(H)  BUN 8 - 23 mg/dL $Remove'14 15 11  'PrcEoCU$ Creatinine 0.44 - 1.00 mg/dL 0.72 0.77 0.70  Sodium 135 - 145 mmol/L 142 142 142  Potassium 3.5 - 5.1 mmol/L 3.9 4.2 4.3  Chloride 98 - 111 mmol/L 105 106 107  CO2 22 - 32 mmol/L $RemoveB'29 26 29  'TsXUtJyl$ Calcium 8.9 - 10.3 mg/dL 9.2 9.2 9.3  Total Protein 6.5 - 8.1 g/dL 7.0 7.1 7.0  Total Bilirubin 0.3 - 1.2 mg/dL 0.4 0.4 0.4  Alkaline Phos 38 - 126 U/L 34(L) 33(L) 30(L)  AST 15 - 41 U/L 18 18 21   ALT 0 - 44 U/L 14 16 16    09/11/2019 Flow Pathology Report [phone]):   09/11/2019 Bone Marrow Report [id number]):    07/14/2019 Cytogenetics [id number]):    07/14/2019 Flow Pathology Report [id number]):   07/14/2019 BM Bx ([id number]):    12/01/18 BM Bx:   12/01/18 Flow Cytometry:       RADIOGRAPHIC STUDIES: I have personally reviewed the radiological images as listed and agreed with the findings in the report. No results found.   ASSESSMENT & PLAN:  80 y.o. female with  1. Multiple Myeloma with  IgG Lambda specificity Labs upon initial presentation from 12/01/18 CBC w/diff revealed HGB at 10.4 with MCV of 105.7. 11/28/18 CMP revealed Creatinine normal at 0.68 and Calcium normal at 8.9. 11/28/18 Beta 2 microglobulin at 2.6mg  (also a reading at 4.7mg  on the same day). 11/30/18 24HR UPEP revealed M spike at 75.1%. 11/28/18 SPEP revealed M spike of 4.6g. 11/28/18 Immunoglobulins revealed IgG elevated at 6181mg .  12/01/18 BM Bx revealed hypercellular bone marrow with 70-80% CD138 immunohistochemistry (44% aspirate) lambda-restricted plasma cells as well as a kappa restricted population of B-cells 11/27/18 MRI Thoracic Spine which revealed Multifocal marrow signal abnormality consistent with metastatic disease or multiple myeloma. The patient has multiple compression fractures most consistent with pathologic injuries. Extensive marrow signal abnormality makes determining age of the fractures difficult but edema is most intense in T3. 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 2. Areas of hypermetabolic disease identified in both lungs suggesting multiple myeloma involvement. 3. 1.9 cm calcified left thyroid nodule is hypermetabolic, but Indeterminate. 4. Colon is diffusely distended with gas and stool. Imaging features would be compatible with clinical constipation. 5.  Aortic Atherosclerois.  Pt describes grade II to III rash on her re-challenge from Revlimid with optimized pre-medications, and we discontinued Revlimid  Began Cytoxan with C4 Velcade and Dexamethasone  05/16/2019 M spike is down to 0.2g/dl showing continued improvement and a 90%  reduction  -07/14/2019 BM Bx revealed BONE MARROW: - Hypercellular marrow with residual plasma cell neoplasm (<10%) PERIPHERAL BLOOD: - Pancytopenia".   07/17/2019 PET/CT Whole Body Scan ([id number]) revealed "1. 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 2. Hypermetabolic calcified left thyroid nodule, as before, indeterminate. 3. Aortic atherosclerosis (ICD10-170.0). Coronary artery calcification."  09/11/2019 Bone Marrow Report ([id number]) revealed "BONE MARROW, ASPIRATE, CLOT, CORE:  -Variably cellular bone marrow with trilineage hematopoiesis and 2% plasma cells PERIPHERAL BLOOD:  -Pancytopenia".  09/11/2019 Flow Pathology Report [id number]) revealed "-Predominance of T lymphocytes with relative abundance of CD8 positive cells  -No significant B-cell population identified -Relative abundance of natural killer cells".  09/14/2019 PET/CT ([id number]) revealed "1. No findings of active malignancy. 2. Numerous thoracolumbar compression fractures many of which may be associated with osteoporosis. 3. Calcified 1.5 cm in diameter left thyroid nodule with persistent elevated activity. A significant minority of hypermetabolic thyroid nodules can be malignant, if not previously worked up to then thyroid ultrasound would be recommended as a next step. 4. There  a few small lucent and small sclerotic lesions in the skeleton which are not hypermetabolic, possibilities include benign lesions or previously effectively treated myeloma. 5. Other imaging findings of potential clinical significance: Aortic Atherosclerosis (ICD10-I70.0). Coronary atherosclerosis. Prominent stool throughout the colon favors constipation. Hyperdense right renal lesion is probably a complex cyst but technically nonspecific."  2. 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 She does not currently have elevated lymphocyte counts on peripheral blood draws.   PLAN: -Discussed pt labwork, 01/22/2021; m-protein gradually increasing, chemistries normal and counts stable. Light chains normal. -Advised pt that screening colonoscopies would typically end around age 57. But, given a history of colon cancer or familial history or in the presence of new symptoms, this would be diagnostic and would be normal / recommended. -Advised pt that laser surgery would be an acceptable option and there is no problem with getting this if needed. -Discussed Evusheld and pt's eligibility. Will send referral. Advised pt to wait around one month after this before getting the second booster shot.  -Advised pt that there needs to be a change of 0.5 in the m-protein prior baseline to consider it progressive. The pt's change is very gradual at this time. Will continue to monitor this instead of jumping straight into re-induction treatment to allow the pt to continue to strengthen. -Advised pt that it would be okay to get the Prevnar-20 and Tetanus vaccinations. Make sure to space out and not get these both at same time and not before a major procedure in case of mild symptoms. -No lab or clinical evidence of significant Multiple Myeloma or Monoclonal B-Cell Lymphocytosis progression at this time. -The pt has no prohibitive toxicities from continuing Ninlaro at this time. -Continue 12.5 mcg/hr Fentanyl patch & Oxycodone prn for pain management. -Continue 5000 UT Vitamin D daily. Goal Vitamin D is 60 <>90 . -Continue Zometa q8weeks. -Will see back in 2 months with labs 1 week prior.   FOLLOW UP: RTC with Dr Irene Limbo with next Zometa infusion in 2 months Labs 1 week prior to clinic visit Referral for Evusheld    The total time spent in the appointment was 30 minutes and more than 50% was on counseling and direct patient  cares.  All of the patient's questions were answered with apparent satisfaction. The patient knows to call the clinic with any problems, questions or concerns.    Sullivan Lone MD [place] [facility] Hematology/Oncology Physician [facility]  (Office):       [phone] (Work cell):  [phone] (Fax):           [phone]  01/29/2021 9:58 AM  I, Reinaldo Raddle, am acting as scribe for Dr. Sullivan Lone, MD.     .I have reviewed the above documentation for accuracy and completeness, and I agree with the above. [name] MD

## 2021-01-29 ENCOUNTER — Other Ambulatory Visit: Payer: Self-pay

## 2021-01-29 ENCOUNTER — Inpatient Hospital Stay: Payer: Medicare HMO | Admitting: Hematology

## 2021-01-29 ENCOUNTER — Inpatient Hospital Stay: Payer: Medicare HMO

## 2021-01-29 VITALS — BP 176/91 | HR 64 | Temp 97.9°F | Resp 18 | Wt 110.9 lb

## 2021-01-29 DIAGNOSIS — I700 Atherosclerosis of aorta: Secondary | ICD-10-CM | POA: Diagnosis not present

## 2021-01-29 DIAGNOSIS — Z5111 Encounter for antineoplastic chemotherapy: Secondary | ICD-10-CM | POA: Diagnosis not present

## 2021-01-29 DIAGNOSIS — C9000 Multiple myeloma not having achieved remission: Secondary | ICD-10-CM | POA: Diagnosis not present

## 2021-01-29 DIAGNOSIS — K5900 Constipation, unspecified: Secondary | ICD-10-CM | POA: Diagnosis not present

## 2021-01-29 DIAGNOSIS — Z7982 Long term (current) use of aspirin: Secondary | ICD-10-CM | POA: Diagnosis not present

## 2021-01-29 DIAGNOSIS — Z87891 Personal history of nicotine dependence: Secondary | ICD-10-CM | POA: Diagnosis not present

## 2021-01-29 DIAGNOSIS — Z7189 Other specified counseling: Secondary | ICD-10-CM

## 2021-01-29 DIAGNOSIS — E041 Nontoxic single thyroid nodule: Secondary | ICD-10-CM | POA: Diagnosis not present

## 2021-01-29 DIAGNOSIS — M8580 Other specified disorders of bone density and structure, unspecified site: Secondary | ICD-10-CM | POA: Diagnosis not present

## 2021-01-29 DIAGNOSIS — M549 Dorsalgia, unspecified: Secondary | ICD-10-CM | POA: Diagnosis not present

## 2021-01-29 DIAGNOSIS — D61818 Other pancytopenia: Secondary | ICD-10-CM | POA: Diagnosis not present

## 2021-01-29 DIAGNOSIS — I10 Essential (primary) hypertension: Secondary | ICD-10-CM | POA: Diagnosis not present

## 2021-01-29 MED ORDER — ZOLEDRONIC ACID 4 MG/100ML IV SOLN
INTRAVENOUS | Status: AC
  Filled 2021-01-29: qty 100

## 2021-01-29 MED ORDER — SODIUM CHLORIDE 0.9 % IV SOLN
INTRAVENOUS | Status: DC
  Filled 2021-01-29: qty 250

## 2021-01-29 MED ORDER — ZOLEDRONIC ACID 4 MG/100ML IV SOLN
4.0000 mg | Freq: Once | INTRAVENOUS | Status: AC
  Administered 2021-01-29: 4 mg via INTRAVENOUS

## 2021-01-29 NOTE — Patient Instructions (Signed)
Zoledronic Acid Injection (Hypercalcemia, Oncology) What is this medicine? ZOLEDRONIC ACID (ZOE le dron ik AS id) slows calcium loss from bones. It high calcium levels in the blood from some kinds of cancer. It may be used in other people at risk for bone loss. This medicine may be used for other purposes; ask your health care provider or pharmacist if you have questions. COMMON BRAND NAME(S): Zometa What should I tell my health care provider before I take this medicine? They need to know if you have any of these conditions:  cancer  dehydration  dental disease  kidney disease  liver disease  low levels of calcium in the blood  lung or breathing disease (asthma)  receiving steroids like dexamethasone or prednisone  an unusual or allergic reaction to zoledronic acid, other medicines, foods, dyes, or preservatives  pregnant or trying to get pregnant  breast-feeding How should I use this medicine? This drug is injected into a vein. It is given by a health care provider in a hospital or clinic setting. Talk to your health care provider about the use of this drug in children. Special care may be needed. Overdosage: If you think you have taken too much of this medicine contact a poison control center or emergency room at once. NOTE: This medicine is only for you. Do not share this medicine with others. What if I miss a dose? Keep appointments for follow-up doses. It is important not to miss your dose. Call your health care provider if you are unable to keep an appointment. What may interact with this medicine?  certain antibiotics given by injection  NSAIDs, medicines for pain and inflammation, like ibuprofen or naproxen  some diuretics like bumetanide, furosemide  teriparatide  thalidomide This list may not describe all possible interactions. Give your health care provider a list of all the medicines, herbs, non-prescription drugs, or dietary supplements you use. Also tell  them if you smoke, drink alcohol, or use illegal drugs. Some items may interact with your medicine. What should I watch for while using this medicine? Visit your health care provider for regular checks on your progress. It may be some time before you see the benefit from this drug. Some people who take this drug have severe bone, joint, or muscle pain. This drug may also increase your risk for jaw problems or a broken thigh bone. Tell your health care provider right away if you have severe pain in your jaw, bones, joints, or muscles. Tell you health care provider if you have any pain that does not go away or that gets worse. Tell your dentist and dental surgeon that you are taking this drug. You should not have major dental surgery while on this drug. See your dentist to have a dental exam and fix any dental problems before starting this drug. Take good care of your teeth while on this drug. Make sure you see your dentist for regular follow-up appointments. You should make sure you get enough calcium and vitamin D while you are taking this drug. Discuss the foods you eat and the vitamins you take with your health care provider. Check with your health care provider if you have severe diarrhea, nausea, and vomiting, or if you sweat a lot. The loss of too much body fluid may make it dangerous for you to take this drug. You may need blood work done while you are taking this drug. Do not become pregnant while taking this drug. Women should inform their health care provider   if they wish to become pregnant or think they might be pregnant. There is potential for serious harm to an unborn child. Talk to your health care provider for more information. What side effects may I notice from receiving this medicine? Side effects that you should report to your doctor or health care provider as soon as possible:  allergic reactions (skin rash, itching or hives; swelling of the face, lips, or tongue)  bone  pain  infection (fever, chills, cough, sore throat, pain or trouble passing urine)  jaw pain, especially after dental work  joint pain  kidney injury (trouble passing urine or change in the amount of urine)  low blood pressure (dizziness; feeling faint or lightheaded, falls; unusually weak or tired)  low calcium levels (fast heartbeat; muscle cramps or pain; pain, tingling, or numbness in the hands or feet; seizures)  low magnesium levels (fast, irregular heartbeat; muscle cramp or pain; muscle weakness; tremors; seizures)  low red blood cell counts (trouble breathing; feeling faint; lightheaded, falls; unusually weak or tired)  muscle pain  redness, blistering, peeling, or loosening of the skin, including inside the mouth  severe diarrhea  swelling of the ankles, feet, hands  trouble breathing Side effects that usually do not require medical attention (report to your doctor or health care provider if they continue or are bothersome):  anxious  constipation  coughing  depressed mood  eye irritation, itching, or pain  fever  general ill feeling or flu-like symptoms  nausea  pain, redness, or irritation at site where injected  trouble sleeping This list may not describe all possible side effects. Call your doctor for medical advice about side effects. You may report side effects to FDA at 1-800-FDA-1088. Where should I keep my medicine? This drug is given in a hospital or clinic. It will not be stored at home. NOTE: This sheet is a summary. It may not cover all possible information. If you have questions about this medicine, talk to your doctor, pharmacist, or health care provider.  2021 [employer] (2019-07-06 09:13:00)  

## 2021-01-30 ENCOUNTER — Telehealth: Payer: Self-pay | Admitting: Hematology

## 2021-01-30 ENCOUNTER — Other Ambulatory Visit (HOSPITAL_COMMUNITY): Payer: Self-pay

## 2021-01-30 NOTE — Telephone Encounter (Signed)
Scheduled follow-up appointments per 4/27 los. Patient's husband is aware.

## 2021-01-31 ENCOUNTER — Other Ambulatory Visit (HOSPITAL_COMMUNITY): Payer: Self-pay

## 2021-02-05 ENCOUNTER — Telehealth: Payer: Self-pay | Admitting: Adult Health

## 2021-02-05 NOTE — Telephone Encounter (Signed)
I called patient to discuss Evusheld, a long acting monoclonal antibody injection administered to patients with decreased immune systems or intolerance/allergy to the COVID 19 vaccine as COVID19 prevention.    Emmakate asked me to send her the information through my chart and then she will call me to discuss further.    Wilber Bihari, NP

## 2021-02-07 ENCOUNTER — Telehealth: Payer: Self-pay | Admitting: Adult Health

## 2021-02-07 NOTE — Telephone Encounter (Signed)
Received call from patient about Evusheld.  We discussed this treatment.  She will call me back next week with her decision.    Wilber Bihari, NP

## 2021-02-20 ENCOUNTER — Encounter: Payer: Self-pay | Admitting: Gastroenterology

## 2021-02-20 ENCOUNTER — Ambulatory Visit: Payer: Medicare HMO | Admitting: Gastroenterology

## 2021-02-20 VITALS — BP 182/90 | HR 68 | Ht 60.5 in | Wt 112.0 lb

## 2021-02-20 DIAGNOSIS — R151 Fecal smearing: Secondary | ICD-10-CM | POA: Diagnosis not present

## 2021-02-20 DIAGNOSIS — R1031 Right lower quadrant pain: Secondary | ICD-10-CM

## 2021-02-20 DIAGNOSIS — R194 Change in bowel habit: Secondary | ICD-10-CM | POA: Diagnosis not present

## 2021-02-20 NOTE — Progress Notes (Signed)
History of Present Illness: This is an 80 year old female referred by [name], MD for the evaluation of change in bowel habits, RLQ pain.  She is under active treatment for multiple myeloma.  She was diagnosed with papillary thyroid cancer and underwent total thyroidectomy by Dr. Harlow Asa iin 07/2020.  When she first started Synthroid she noted diarrhea and since then her dose was reduced and her diarrhea has resolved.  She has a long history of constipation and for the past 2 years prior to her thyroidectomy she was taking MiraLAX and Senokot on a daily basis.  She has discontinued Senokot and has reduced her dose of MiraLAX.  She current is having 1-2 soft stools per day.  She notes intermittent brief mild right lower quadrant pains  not necessarily associated with any particular activity, meals or bowel movement.  She also notes occasional incontinence of stool when on the commode urinating and occasionally mild fecal smearing at night.  She has a history of 1 adenomatous colon polyp removed in 2016. Denies weight loss, change in stool caliber, melena, hematochezia, nausea, vomiting, dysphagia, reflux symptoms, chest pain.    PET scan 09/2019 1. No findings of active malignancy. 2. Numerous thoracolumbar compression fractures many of which may be associated with osteoporosis. 3. Calcified 1.5 cm in diameter left thyroid nodule with persistent elevated activity. A significant minority of hypermetabolic thyroid nodules can be malignant, if not previously worked up to then thyroid ultrasound would be recommended as a next step. 4. There a few small lucent and small sclerotic lesions in the skeleton which are not hypermetabolic, possibilities include benign lesions or previously effectively treated myeloma. 5. Other imaging findings of potential clinical significance: Aortic Atherosclerosis (ICD10-I70.0). Coronary atherosclerosis. Prominent stool throughout the colon favors constipation.  Hyperdense right renal lesion is probably a complex cyst but technically nonspecific.   Colonoscopy 07/2015 1. Sessile polyp in the ascending colon; polypectomy was performed with a cold snare 2. Grade l internal hemorrhoids RECOMMENDATIONS: 1. Hold Aspirin and all other NSAIDS for 2 weeks. 2. Await pathology results 3. Consider repeat colonoscopy in 5 years if polyp is adenomatous however given age and tortuous, redundant colon may defer; otherwise, given your age, you will not need another colonoscopy for colon cancer screening or polyp surveillance.   Allergies  Allergen Reactions  . Ace Inhibitors Cough  . Benadryl [Diphenhydramine] Other (See Comments)    Heart races  . Diamox [Acetazolamide]     Hypotensive event at ophthalmologist  . Sulfamethoxazole     REACTION: rash  . Lenalidomide Rash  . Penicillins Rash    REACTION: rash Did it involve swelling of the face/tongue/throat, SOB, or low BP? Yes Did it involve sudden or severe rash/hives, skin peeling, or any reaction on the inside of your mouth or nose? No Did you need to seek medical attention at a hospital or doctor's office? No When did it last happen?more than 10 years ago If all above answers are "NO", may proceed with cephalosporin use.    Outpatient Medications Prior to Visit  Medication Sig Dispense Refill  . acyclovir (ZOVIRAX) 400 MG tablet Take 1 tablet (400 mg total) by mouth 2 (two) times daily. 60 tablet 3  . ALPHAGAN P 0.1 % SOLN Place 1 drop into both eyes in the morning, at noon, and at bedtime.     Marland Kitchen amLODipine (NORVASC) 5 MG tablet Take 1 tablet (5 mg total) by mouth at bedtime. 90 tablet 3  .  aspirin EC 81 MG tablet Take 81 mg by mouth daily.    . bimatoprost (LUMIGAN) 0.03 % ophthalmic solution Place 1 drop into both eyes at bedtime.     . Cholecalciferol (VITAMIN D3) 125 MCG (5000 UT) TABS Take 5,000 Units by mouth daily.    Marland Kitchen Co-Enzyme Q-10 100 MG CAPS Take 100 mg by mouth daily.    .  dorzolamide-timolol (COSOPT) 22.3-6.8 MG/ML ophthalmic solution Place 1 drop into both eyes 2 (two) times daily.   11  . fentaNYL (DURAGESIC) 12 MCG/HR Place 1 patch onto the skin every 3 (three) days. 10 patch 0  . hydrOXYzine (ATARAX/VISTARIL) 25 MG tablet Take 1 tablet (25 mg total) by mouth 3 (three) times daily as needed. 30 tablet 0  . ixazomib citrate (NINLARO) 3 MG capsule TAKE 1 CAP (3 MG) BY MOUTH WEEKLY, 3 WEEKS ON, 1 WEEK OFF, REPEAT EVERY 4 WEEKS. TAKE ON AN EMPTY STOMACH 1HR BEFORE OR 2HR AFTER MEAL (Patient taking differently: TAKE 1 CAP (3 MG) BY MOUTH WEEKLY, 3 WEEKS ON, 1 WEEK OFF, REPEAT EVERY 4 WEEKS. TAKE ON AN EMPTY STOMACH 1HR BEFORE OR 2HR AFTER MEAL) 3 capsule 2  . levothyroxine (SYNTHROID) 75 MCG tablet Take 1 tablet (75 mcg total) by mouth daily before breakfast. 90 tablet 3  . Multiple Vitamins-Minerals (MULTIVITAMIN WITH MINERALS) tablet Take 1 tablet by mouth daily. Centrum Silver 50 +    . Omega-3 1000 MG CAPS Take 1,000 mg by mouth daily.     Marland Kitchen oxyCODONE-acetaminophen (PERCOCET) 5-325 MG tablet Take 1-2 tablets by mouth every 4 (four) hours as needed for moderate pain or severe pain. 90 tablet 0  . polyethylene glycol (MIRALAX) packet Take 17 g by mouth daily. 30 each 1  . senna-docusate (SENOKOT-S) 8.6-50 MG tablet Take 2 tablets by mouth at bedtime. 60 tablet 2  . Simethicone (GAS-X PO) Take 1 tablet by mouth as needed.    . vitamin C (ASCORBIC ACID) 500 MG tablet Take 500 mg by mouth 2 (two) times a week.     Marland Kitchen LUMIGAN 0.01 % SOLN Place 1 drop into both eyes at bedtime.    . simethicone (MYLICON) 80 MG chewable tablet Chew 80 mg by mouth every evening.      No facility-administered medications prior to visit.   Past Medical History:  Diagnosis Date  . Anemia   . Glaucoma   . HYPERTENSION 03/11/2007  . Multiple myeloma ([place])   . OSTEOPENIA 03/11/2007  . Pre-diabetes   . Thyroid cancer ([place])   . Thyroid disease   . Tubular adenoma of colon 07/2015   Past  Surgical History:  Procedure Laterality Date  . BONE MARROW BIOPSY     multiple  . BREAST EXCISIONAL BIOPSY Right 2000  . BREAST LUMPECTOMY  1990   benign  . CATARACT EXTRACTION Bilateral 2018  . DILATION AND CURETTAGE OF UTERUS     bleeding at menopause. No uterine cancer  . IR RADIOLOGIST EVAL & MGMT  12/13/2018  . THYROIDECTOMY N/A 08/02/2020   Procedure: TOTAL THYROIDECTOMY;  Surgeon: [name], MD;  Location: [facility];  Service: General;  Laterality: N/A;  . TONSILLECTOMY     age 73   Social History   Socioeconomic History  . Marital status: Married    Spouse name: Not on file  . Number of children: 1  . Years of education: Not on file  . Highest education level: Not on file  Occupational History  . Occupation: Retired  Tobacco Use  . Smoking status: Former Smoker    Packs/day: 0.50    Years: 7.00    Pack years: 3.50    Types: Cigarettes    Quit date: 01/04/1961    Years since quitting: 60.1  . Smokeless tobacco: Never Used  Vaping Use  . Vaping Use: Never used  Substance and Sexual Activity  . Alcohol use: Not Currently    Alcohol/week: 1.0 standard drink    Types: 1 Standard drinks or equivalent per week    Comment: occas   . Drug use: No  . Sexual activity: Not Currently  Other Topics Concern  . Not on file  Social History Narrative   Married. Lives with husband (patient of Dr. Yong Channel). 1 son. No grandkids. 1 granddog.       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 Heart disease Mother        CHF mother died 23  . Arthritis Mother   . Glaucoma Mother         sister as well  . Alcohol abuse Father   . Suicidality Father   . Heart disease Sister        aortic valve replacement  . Lung cancer Sister        smoker  . Hyperlipidemia Brother   . Hypertension Brother   . COPD Brother   . Colon polyps Brother   . Arthritis Sister   . Hypertension Sister   . Glaucoma Sister   . Hashimoto's thyroiditis Sister   . Colon polyps Sister   . Hypertension Son   . Stroke Maternal Grandmother   . Cystic fibrosis Niece   . Colon cancer Neg Hx       Review of Systems: Pertinent positive and negative review of systems were noted in the above HPI section. All other review of systems were otherwise negative.   Physical Exam: General: Well developed, well nourished, no acute distress Head: Normocephalic and atraumatic Eyes: Sclerae anicteric, EOMI Ears: Normal auditory acuity Mouth: Not examined, mask on during Covid-19 pandemic Neck: Supple, no masses or thyromegaly Lungs: Clear throughout to auscultation Heart: Regular rate and rhythm; no murmurs, rubs or bruits Abdomen: Soft, non tender and non distended. No masses, hepatosplenomegaly or hernias noted. Normal Bowel sounds Rectal: Very good sphincter tone, good squeeze, no lesions, no tenderness, heme negative soft brown stool  Musculoskeletal: Symmetrical with no gross deformities  Skin: No lesions on visible extremities Pulses:  Normal pulses noted Extremities: No clubbing, cyanosis, edema or deformities noted Neurological: Alert oriented x 4, grossly nonfocal Cervical Nodes:  No significant cervical adenopathy Inguinal Nodes: No significant inguinal adenopathy Psychological:  Alert and cooperative. Normal mood and affect   Assessment and Recommendations:  1. Constipation with soft stools on Miralax.  Continue MiraLAX and titrate dose for 1-2 complete bowel movements daily.  2. Infrequent mild RLQ pain.  Etiology unclear.  PET scan in December 2020 was without abdominal findings except for  increased colonic stool.  3. Occasional fecal incontinence.  Good sphincter tone.  Suspect this  is overflow incontinence from incomplete fecal evacuation.  Trial of Kegel exercises 5 times a day for 2 months and assess response.  Advised to increase MiraLAX dose and to plan trips to the commode for BMs at least twice daily  4. Personal history of adenomatous colon polyps.  PET scan in December 202o was without colonic findings except for increased stool. Given her age, comorbidities and active chemotherapy treatment she is not a candidate for surveillance colonoscopy.  Plan to defer all future surveillance colonoscopies.   cc: [name], [address] [place],  [place] [id number]

## 2021-02-20 NOTE — Patient Instructions (Signed)
Continue miralax daily.    Kegel Exercises  Kegel exercises can help strengthen your pelvic floor muscles. The pelvic floor is a group of muscles that support your rectum, small intestine, and bladder. In females, pelvic floor muscles also help support the womb (uterus). These muscles help you control the flow of urine and stool. Kegel exercises are painless and simple, and they do not require any equipment. Your provider may suggest Kegel exercises to:  Improve bladder and bowel control.  Improve sexual response.  Improve weak pelvic floor muscles after surgery to remove the uterus (hysterectomy) or pregnancy (females).  Improve weak pelvic floor muscles after prostate gland removal or surgery (males). Kegel exercises involve squeezing your pelvic floor muscles, which are the same muscles you squeeze when you try to stop the flow of urine or keep from passing gas. The exercises can be done while sitting, standing, or lying down, but it is best to vary your position. Exercises How to do Kegel exercises: 1. Squeeze your pelvic floor muscles tight. You should feel a tight lift in your rectal area. If you are a female, you should also feel a tightness in your vaginal area. Keep your stomach, buttocks, and legs relaxed. 2. Hold the muscles tight for up to 10 seconds. 3. Breathe normally. 4. Relax your muscles. 5. Repeat as told by your health care provider. Repeat this exercise daily as told by your health care provider. Continue to do this exercise for at least 4-6 weeks, or for as long as told by your health care provider. You may be referred to a physical therapist who can help you learn more about how to do Kegel exercises. Depending on your condition, your health care provider may recommend:  Varying how long you squeeze your muscles.  Doing several sets of exercises every day.  Doing exercises for several weeks.  Making Kegel exercises a part of your regular exercise routine. This  information is not intended to replace advice given to you by your health care provider. Make sure you discuss any questions you have with your health care provider. Document Revised: 01/26/2020 Document Reviewed: 05/11/2018 Elsevier Patient Education  2021 [place] you for choosing me and [facility].  Pricilla Riffle. Dagoberto Ligas., MD., [facility]

## 2021-02-21 ENCOUNTER — Other Ambulatory Visit: Payer: Self-pay | Admitting: *Deleted

## 2021-02-21 DIAGNOSIS — C9000 Multiple myeloma not having achieved remission: Secondary | ICD-10-CM

## 2021-02-21 MED ORDER — FENTANYL 12 MCG/HR TD PT72: 1.0000 | MEDICATED_PATCH | TRANSDERMAL | 0 refills | Status: DC

## 2021-02-21 NOTE — Telephone Encounter (Signed)
Patient called - requested refill for fentanyl patch

## 2021-02-25 ENCOUNTER — Other Ambulatory Visit: Payer: Self-pay | Admitting: Hematology

## 2021-02-25 ENCOUNTER — Other Ambulatory Visit (HOSPITAL_COMMUNITY): Payer: Self-pay

## 2021-02-25 NOTE — Telephone Encounter (Signed)
Dr. Irene Limbo - please review for refill

## 2021-02-26 ENCOUNTER — Other Ambulatory Visit: Payer: Self-pay | Admitting: Adult Health

## 2021-02-26 DIAGNOSIS — C9000 Multiple myeloma not having achieved remission: Secondary | ICD-10-CM

## 2021-02-26 NOTE — Progress Notes (Signed)
I connected by phone with Megan Olson on 02/26/2021, 11:13 AM to discuss the potential use of a new treatment, tixagevimab/cilgavimab, for pre-exposure prophylaxis for prevention of coronavirus disease 2019 (COVID-19) caused by the SARS-CoV-2 virus.  This patient is a 80 y.o. female that meets the FDA criteria for Emergency Use Authorization of tixagevimab/cilgavimab for pre-exposure prophylaxis of COVID-19 disease. Pt meets following criteria:  Age >12 yr and weight > 40kg  Not currently infected with SARS-CoV-2 and has no known recent exposure to an individual infected with SARS-CoV-2 AND o Who has moderate to severe immune compromise due to a medical condition or receipt of immunosuppressive medications or treatments and may not mount an adequate immune response to COVID-19 vaccination or  o Vaccination with any available COVID-19 vaccine, according to the approved or authorized schedule, is not recommended due to a history of severe adverse reaction (e.g., severe allergic reaction) to a COVID-19 vaccine(s) and/or COVID-19 vaccine component(s).  o Patient meets the following definition of mod-severe immune compromised status: 4. Lung transplants, other solid organ transplant recipients on continual belatacept therapy, or multiple myeloma (actively receiving treatment)  I have spoken and communicated the following to the patient or parent/caregiver regarding COVID monoclonal antibody treatment:  1. FDA has authorized the emergency use of tixagevimab/cilgavimab for the pre-exposure prophylaxis of COVID-19 in patients with moderate-severe immunocompromised status, who meet above EUA criteria.  2. The significant known and potential risks and benefits of COVID monoclonal antibody, and the extent to which such potential risks and benefits are unknown.  3. Information on available alternative treatments and the risks and benefits of those alternatives, including clinical trials.  4. The patient or  parent/caregiver has the option to accept or refuse COVID monoclonal antibody treatment.  After reviewing this information with the patient, agree to receive tixagevimab/cilgavimab.  She will receive this on 6/28 when she comes in for treatment.   Scot Dock, NP, 02/26/2021, 11:13 AM

## 2021-02-27 ENCOUNTER — Other Ambulatory Visit: Payer: Self-pay | Admitting: Hematology

## 2021-02-27 ENCOUNTER — Other Ambulatory Visit (HOSPITAL_COMMUNITY): Payer: Self-pay

## 2021-02-27 MED ORDER — IXAZOMIB CITRATE 3 MG PO CAPS
ORAL_CAPSULE | ORAL | 2 refills | Status: DC
  Filled 2021-02-27: qty 3, 28d supply, fill #0
  Filled 2021-03-25: qty 3, 28d supply, fill #1

## 2021-02-28 ENCOUNTER — Other Ambulatory Visit: Payer: Self-pay

## 2021-02-28 DIAGNOSIS — C9000 Multiple myeloma not having achieved remission: Secondary | ICD-10-CM

## 2021-02-28 MED ORDER — ACYCLOVIR 400 MG PO TABS: 400.0000 mg | ORAL_TABLET | Freq: Two times a day (BID) | ORAL | 3 refills | Status: DC

## 2021-03-10 DIAGNOSIS — H401122 Primary open-angle glaucoma, left eye, moderate stage: Secondary | ICD-10-CM | POA: Diagnosis not present

## 2021-03-10 DIAGNOSIS — H401112 Primary open-angle glaucoma, right eye, moderate stage: Secondary | ICD-10-CM | POA: Diagnosis not present

## 2021-03-13 DIAGNOSIS — E890 Postprocedural hypothyroidism: Secondary | ICD-10-CM | POA: Diagnosis not present

## 2021-03-13 DIAGNOSIS — C73 Malignant neoplasm of thyroid gland: Secondary | ICD-10-CM | POA: Diagnosis not present

## 2021-03-17 ENCOUNTER — Other Ambulatory Visit (INDEPENDENT_AMBULATORY_CARE_PROVIDER_SITE_OTHER): Payer: Medicare HMO

## 2021-03-17 ENCOUNTER — Other Ambulatory Visit: Payer: Self-pay

## 2021-03-17 DIAGNOSIS — E890 Postprocedural hypothyroidism: Secondary | ICD-10-CM | POA: Diagnosis not present

## 2021-03-17 LAB — TSH: TSH: 3.17 u[IU]/mL (ref 0.35–4.50)

## 2021-03-17 LAB — T4, FREE: Free T4: 0.86 ng/dL (ref 0.60–1.60)

## 2021-03-24 ENCOUNTER — Other Ambulatory Visit: Payer: Self-pay | Admitting: *Deleted

## 2021-03-24 DIAGNOSIS — C9000 Multiple myeloma not having achieved remission: Secondary | ICD-10-CM

## 2021-03-24 MED ORDER — FENTANYL 12 MCG/HR TD PT72
1.0000 | MEDICATED_PATCH | TRANSDERMAL | 0 refills | Status: DC
Start: 2021-03-24 — End: 2021-04-23

## 2021-03-25 ENCOUNTER — Inpatient Hospital Stay: Payer: Medicare HMO | Attending: Hematology

## 2021-03-25 ENCOUNTER — Other Ambulatory Visit: Payer: Self-pay

## 2021-03-25 ENCOUNTER — Other Ambulatory Visit (HOSPITAL_COMMUNITY): Payer: Self-pay

## 2021-03-25 DIAGNOSIS — R21 Rash and other nonspecific skin eruption: Secondary | ICD-10-CM | POA: Diagnosis not present

## 2021-03-25 DIAGNOSIS — E041 Nontoxic single thyroid nodule: Secondary | ICD-10-CM | POA: Diagnosis not present

## 2021-03-25 DIAGNOSIS — I10 Essential (primary) hypertension: Secondary | ICD-10-CM | POA: Diagnosis not present

## 2021-03-25 DIAGNOSIS — K5900 Constipation, unspecified: Secondary | ICD-10-CM | POA: Insufficient documentation

## 2021-03-25 DIAGNOSIS — Z79899 Other long term (current) drug therapy: Secondary | ICD-10-CM | POA: Insufficient documentation

## 2021-03-25 DIAGNOSIS — Z7982 Long term (current) use of aspirin: Secondary | ICD-10-CM | POA: Insufficient documentation

## 2021-03-25 DIAGNOSIS — M8580 Other specified disorders of bone density and structure, unspecified site: Secondary | ICD-10-CM | POA: Insufficient documentation

## 2021-03-25 DIAGNOSIS — Z801 Family history of malignant neoplasm of trachea, bronchus and lung: Secondary | ICD-10-CM | POA: Diagnosis not present

## 2021-03-25 DIAGNOSIS — Z803 Family history of malignant neoplasm of breast: Secondary | ICD-10-CM | POA: Diagnosis not present

## 2021-03-25 DIAGNOSIS — Z87891 Personal history of nicotine dependence: Secondary | ICD-10-CM | POA: Diagnosis not present

## 2021-03-25 DIAGNOSIS — I700 Atherosclerosis of aorta: Secondary | ICD-10-CM | POA: Insufficient documentation

## 2021-03-25 DIAGNOSIS — D61818 Other pancytopenia: Secondary | ICD-10-CM | POA: Insufficient documentation

## 2021-03-25 DIAGNOSIS — C9000 Multiple myeloma not having achieved remission: Secondary | ICD-10-CM | POA: Insufficient documentation

## 2021-03-25 DIAGNOSIS — Z298 Encounter for other specified prophylactic measures: Secondary | ICD-10-CM | POA: Insufficient documentation

## 2021-03-25 DIAGNOSIS — I2510 Atherosclerotic heart disease of native coronary artery without angina pectoris: Secondary | ICD-10-CM | POA: Insufficient documentation

## 2021-03-25 LAB — CBC WITH DIFFERENTIAL/PLATELET
Abs Immature Granulocytes: 0.01 10*3/uL (ref 0.00–0.07)
Basophils Absolute: 0 10*3/uL (ref 0.0–0.1)
Basophils Relative: 1 %
Eosinophils Absolute: 0.1 10*3/uL (ref 0.0–0.5)
Eosinophils Relative: 1 %
HCT: 36.3 % (ref 36.0–46.0)
Hemoglobin: 12.1 g/dL (ref 12.0–15.0)
Immature Granulocytes: 0 %
Lymphocytes Relative: 8 %
Lymphs Abs: 0.5 10*3/uL — ABNORMAL LOW (ref 0.7–4.0)
MCH: 33.8 pg (ref 26.0–34.0)
MCHC: 33.3 g/dL (ref 30.0–36.0)
MCV: 101.4 fL — ABNORMAL HIGH (ref 80.0–100.0)
Monocytes Absolute: 0.5 10*3/uL (ref 0.1–1.0)
Monocytes Relative: 9 %
Neutro Abs: 4.6 10*3/uL (ref 1.7–7.7)
Neutrophils Relative %: 81 %
Platelets: 213 10*3/uL (ref 150–400)
RBC: 3.58 MIL/uL — ABNORMAL LOW (ref 3.87–5.11)
RDW: 14.1 % (ref 11.5–15.5)
WBC: 5.7 10*3/uL (ref 4.0–10.5)
nRBC: 0 % (ref 0.0–0.2)

## 2021-03-25 LAB — CMP (CANCER CENTER ONLY)
ALT: 18 U/L (ref 0–44)
AST: 21 U/L (ref 15–41)
Albumin: 4.6 g/dL (ref 3.5–5.0)
Alkaline Phosphatase: 37 U/L — ABNORMAL LOW (ref 38–126)
Anion gap: 5 (ref 5–15)
BUN: 18 mg/dL (ref 8–23)
CO2: 30 mmol/L (ref 22–32)
Calcium: 9.5 mg/dL (ref 8.9–10.3)
Chloride: 105 mmol/L (ref 98–111)
Creatinine: 0.67 mg/dL (ref 0.44–1.00)
GFR, Estimated: >60 mL/min (ref >60)
Glucose, Bld: 109 mg/dL — ABNORMAL HIGH (ref 70–99)
Potassium: 3.8 mmol/L (ref 3.5–5.1)
Sodium: 140 mmol/L (ref 135–145)
Total Bilirubin: 0.5 mg/dL (ref 0.3–1.2)
Total Protein: 7.1 g/dL (ref 6.5–8.1)

## 2021-03-26 ENCOUNTER — Other Ambulatory Visit (HOSPITAL_COMMUNITY): Payer: Self-pay

## 2021-03-26 LAB — KAPPA/LAMBDA LIGHT CHAINS
Kappa free light chain: 11.6 mg/L (ref 3.3–19.4)
Kappa, lambda light chain ratio: 0.36 (ref 0.26–1.65)
Lambda free light chains: 32.2 mg/L — ABNORMAL HIGH (ref 5.7–26.3)

## 2021-03-27 ENCOUNTER — Other Ambulatory Visit: Payer: Self-pay | Admitting: *Deleted

## 2021-03-27 DIAGNOSIS — C9000 Multiple myeloma not having achieved remission: Secondary | ICD-10-CM

## 2021-03-27 MED ORDER — OXYCODONE-ACETAMINOPHEN 5-325 MG PO TABS: 1.0000 | ORAL_TABLET | ORAL | 0 refills | Status: DC | PRN

## 2021-03-27 NOTE — Telephone Encounter (Signed)
Patient called - requested refill of oxycodone Refill routed to Dr. Lorenso Courier in Dr. Grier Mitts absence

## 2021-03-28 LAB — MULTIPLE MYELOMA PANEL, SERUM
Albumin SerPl Elph-Mcnc: 4.3 g/dL (ref 2.9–4.4)
Albumin/Glob SerPl: 1.7 (ref 0.7–1.7)
Alpha 1: 0.2 g/dL (ref 0.0–0.4)
Alpha2 Glob SerPl Elph-Mcnc: 0.8 g/dL (ref 0.4–1.0)
B-Globulin SerPl Elph-Mcnc: 1.2 g/dL (ref 0.7–1.3)
Gamma Glob SerPl Elph-Mcnc: 0.4 g/dL (ref 0.4–1.8)
Globulin, Total: 2.6 g/dL (ref 2.2–3.9)
IgA: 32 mg/dL — ABNORMAL LOW (ref 64–422)
IgG (Immunoglobin G), Serum: 904 mg/dL (ref 586–1602)
IgM (Immunoglobulin M), Srm: 32 mg/dL (ref 26–217)
M Protein SerPl Elph-Mcnc: 0.6 g/dL — ABNORMAL HIGH
Total Protein ELP: 6.9 g/dL (ref 6.0–8.5)

## 2021-03-31 ENCOUNTER — Telehealth: Payer: Self-pay | Admitting: Hematology

## 2021-03-31 NOTE — Telephone Encounter (Signed)
Left message with changed MD appointment due to provider out of office.

## 2021-03-31 NOTE — Progress Notes (Signed)
[facility] OFFICE PROGRESS NOTE  [name], MD [place] [address]  DIAGNOSIS: Multiple Myeloma  CURRENT THERAPY: Zometa Q8 weeks   INTERVAL HISTORY: Megan Olson 80 y.o. female returns for management and evaluation of her multiple myeloma. The patient's last visit with [place] was on 01/29/2021. The pt reports that she is doing well overall without any new concerning complains. She has some questions regarding her recent repeat myeloma labs as well as questions about the Evusheld which she is scheduled to receive today  The patient denies fevers, chills, night sweats, or unexplained weight loss. Denies recent signs and symptoms of infection. She continues to take oxycodone PRN and fentanyl 12.5 mcg for pain control.  She states that she averages 3/2 a tablet of oxycodone per day.  She denies new back pain. She denies abnormal bleeding and bruising except for easy bruising if she bumps her extremity.    Lab results 03/25/2021 of CBC w/diff and CMP is as follows: all values are WNL except for RBC of 3.58, MCV of 101.4, Lymphs Abs of 0.5K, Alkaline Phosphatase of 37. 03/25/2021 Light Chains  elevated at 32.2 01/22/2021 MMP WNL except m-protein of 0.6.    HISTORY OF PRESENTING ILLNESS:    Megan Olson is a wonderful 80 y.o. female who has been referred to [place] by Dr. 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 She is accompanied today by her husband. The pt reports that she is doing well overall.   The pt notes that she was doing extensive yard work in October 2019, and began to feel back pain a few days after this, which she attributed to muscle pain. She recalls taking a deep breath, and developing sudden acute pain, and presented to care with her PCP's office. She began exercises for her back pain, without relief, then began PT without relief, then was referred to Dr. Paulla Fore in sports medicine in  late January 2020. She had an XR which revealed a compression fracture at T11, then subsequently had an MRI, and a bone marrow biopsy. The pt notes that her back pain "seemed to move around." She endorses pain "up the whole left side" of her back.   The pt notes that she is not able to stand up straight due to her back pain, which she feels limits her ability to take a deep breath, and endorses pain exacerbation when she does take a deep breath. The pt needs assistance from sitting to lying from her husband. She is using about 3 Percocet a day.   The pt notes worsened constipation since beginning Percocet, and notes that her most recent laxative order was not covered by her insurance. She is using prune juice and milk of magnesia. She took Senokot S BID without relief.   The pt reports that prior to her recent back pain, she had very few medical concerns. She endorses controlled BP, and has been monitored for DM but after closely watching her diet her A1C decreased to 5.8. She has glaucoma, and has had surgery in both eyes. Right eye with stent. The pt sees [facility]. Edilia Bo at [facility] for her [facility] care. The pt notes that her vision has been recently "pretty good." The pt notes that she has been advised to limit treatment with steroids for her glaucoma. She denies heart or lung problems. Denies previous back problems. Last DEXA scan 3 years ago, and endorses osteopenia. She notes  that she took Fosamax for 3-4 years, and stopped 5-6 years ago. She takes Vitamin D, a multivitamin, and magnesium.   The pt notes that she quit smoking cigarettes when she was 27, started when she was about 20. The pt consumes alcohol rarely, but not since beginning narcotics. 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   Of note prior to the patient's visit today, pt has had an MRI Thoracic Spine completed on 11/27/18 with results revealing Multifocal marrow signal abnormality consistent with metastatic disease or multiple myeloma.  The patient has multiple compression fractures most consistent with pathologic injuries. Extensive marrow signal abnormality makes determining age of the fractures difficult but edema is most intense in T3. 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   Most recent lab results (12/01/18) of CBC w/diff is as follows: all values are WNL except for RBC at 3.17, HGB at 10.4, HCT at 33.5, MCV at 105.7. 11/28/18 CMP revealed all values WNL except for Glucose at 105, Total Protein at 10.2, Albumin at 3.1 11/30/18 24HR UPEP revealed all values WNL except for M-spike at 75.1%. 11/28/18 Beta-2 microglobulin slightly elevated at 2.6 11/28/18 SPEP revealed M spike at 4.6g 11/28/18 Immunoglobulins revealed IgG at 6181, IgA at 24, IgM at 16, and IgE at 6.   On review of systems, pt reports constipation, back pain, stable energy levels, ankle swelling, tenderness at T3, lower back pain, and denies abdominal pains, neck pain, and any other symptoms.   On PMHx the pt reports redundant colon, single hemorrhoid, glaucoma, HTN, osteopenia, Tonsillectomy, right eye stent and multiple eye surgeries. On Social Hx the pt reports rare alcohol use, smoked cigarettes between ages 81 and 8. Formerly worked in Furniture conservator/restorer. On Family Hx the pt reports sister died from small cell lung cancer and was a lifelong smoker, brother's daughter died of breast cancer with BRCA1 and BRCA2 mutations. Cousin with female breast cancer.  MEDICAL HISTORY: Past Medical History:  Diagnosis Date   Anemia    Glaucoma    HYPERTENSION 03/11/2007   Multiple myeloma (HCC)    OSTEOPENIA 03/11/2007   Pre-diabetes    Thyroid cancer (HCC)    Thyroid disease    Tubular adenoma of colon 07/2015    ALLERGIES:  is allergic to ace inhibitors, benadryl [diphenhydramine], diamox [acetazolamide], sulfamethoxazole, lenalidomide, and penicillins.  MEDICATIONS:  Current  Outpatient Medications  Medication Sig Dispense Refill   acyclovir (ZOVIRAX) 400 MG tablet Take 1 tablet (400 mg total) by mouth 2 (two) times daily. 60 tablet 3   ALPHAGAN P 0.1 % SOLN Place 1 drop into both eyes in the morning, at noon, and at bedtime.      amLODipine (NORVASC) 5 MG tablet Take 1 tablet (5 mg total) by mouth at bedtime. 90 tablet 3   aspirin EC 81 MG tablet Take 81 mg by mouth daily.     bimatoprost (LUMIGAN) 0.03 % ophthalmic solution Place 1 drop into both eyes at bedtime.      Cholecalciferol (VITAMIN D3) 125 MCG (5000 UT) TABS Take 5,000 Units by mouth daily.     Co-Enzyme Q-10 100 MG CAPS Take 100 mg by mouth daily.     dorzolamide-timolol (COSOPT) 22.3-6.8 MG/ML ophthalmic solution Place 1 drop into both eyes 2 (two) times daily.   11   fentaNYL (DURAGESIC) 12 MCG/HR Place 1 patch onto the skin every 3 (three) days. 10 patch 0  hydrOXYzine (ATARAX/VISTARIL) 25 MG tablet Take 1 tablet (25 mg total) by mouth 3 (three) times daily as needed. 30 tablet 0   ixazomib citrate (NINLARO) 3 MG capsule TAKE 1 CAP (3 MG) BY MOUTH WEEKLY, 3 WEEKS ON, 1 WEEK OFF, REPEAT EVERY 4 WEEKS. TAKE ON AN EMPTY STOMACH 1HR BEFORE OR 2HR AFTER MEAL 3 capsule 2   levothyroxine (SYNTHROID) 75 MCG tablet Take 1 tablet (75 mcg total) by mouth daily before breakfast. 90 tablet 3   Multiple Vitamins-Minerals (MULTIVITAMIN WITH MINERALS) tablet Take 1 tablet by mouth daily. Centrum Silver 50 +     Omega-3 1000 MG CAPS Take 1,000 mg by mouth daily.      oxyCODONE-acetaminophen (PERCOCET) 5-325 MG tablet Take 1-2 tablets by mouth every 4 (four) hours as needed for moderate pain or severe pain. 90 tablet 0   polyethylene glycol (MIRALAX) packet Take 17 g by mouth daily. 30 each 1   senna-docusate (SENOKOT-S) 8.6-50 MG tablet Take 2 tablets by mouth at bedtime. 60 tablet 2   Simethicone (GAS-X PO) Take 1 tablet by mouth as needed.     vitamin C (ASCORBIC ACID) 500 MG tablet Take 500 mg by mouth 2 (two)  times a week.      No current facility-administered medications for this visit.   Facility-Administered Medications Ordered in Other Visits  Medication Dose Route Frequency Provider Last Rate Last Admin   EPINEPHrine (EPI-PEN) injection 0.3 mg  0.3 mg Intramuscular Once PRN Causey, Charlestine Massed, NP        SURGICAL HISTORY:  Past Surgical History:  Procedure Laterality Date   BONE MARROW BIOPSY     multiple   BREAST EXCISIONAL BIOPSY Right 2000   BREAST LUMPECTOMY  1990   benign   CATARACT EXTRACTION Bilateral 2018   DILATION AND CURETTAGE OF UTERUS     bleeding at menopause. No uterine cancer   IR RADIOLOGIST EVAL & MGMT  12/13/2018   THYROIDECTOMY N/A 08/02/2020   Procedure: TOTAL THYROIDECTOMY;  Surgeon: [name], MD;  Location: [facility];  Service: General;  Laterality: N/A;   TONSILLECTOMY     age 61    REVIEW OF SYSTEMS:   Review of Systems  Constitutional: Negative for appetite change, chills, fatigue, fever and unexpected weight change.  HENT: Negative for mouth sores, nosebleeds, sore throat and trouble swallowing.   Eyes: Negative for eye problems and icterus.  Respiratory: Negative for cough, hemoptysis, shortness of breath and wheezing.   Cardiovascular: Negative for chest pain and leg swelling.  Gastrointestinal: Negative for abdominal pain, constipation, diarrhea, nausea and vomiting.  Genitourinary: Negative for bladder incontinence, difficulty urinating, dysuria, frequency and hematuria.   Musculoskeletal: Positive for chronic back pain. Negative for gait problem, neck pain and neck stiffness.  Skin: Negative for itching and rash.  Neurological: Negative for dizziness, extremity weakness, gait problem, headaches, light-headedness and seizures.  Hematological: Negative for adenopathy. Does not bruise/bleed easily.  Psychiatric/Behavioral: Negative for confusion, depression and sleep disturbance. The patient is not nervous/anxious.     PHYSICAL EXAMINATION:   Blood pressure (!) 185/89, pulse 68, temperature (!) 97.5 F (36.4 C), temperature source Oral, resp. rate 17, weight 110 lb 14.4 oz (50.3 kg), SpO2 100 %.  ECOG PERFORMANCE STATUS: 0  Physical Exam  Constitutional: Oriented to person, place, and time and thin appearing female and in no distress.  HENT:  Head: Normocephalic and atraumatic.  Mouth/Throat: Oropharynx is clear and moist. No oropharyngeal exudate.  Eyes: Conjunctivae are normal. Right eye  exhibits no discharge. Left eye exhibits no discharge. No scleral icterus.  Neck: Normal range of motion. Neck supple.  Cardiovascular: Normal rate, regular rhythm, normal heart sounds and intact distal pulses.   Pulmonary/Chest: Effort normal and breath sounds normal. No respiratory distress. No wheezes. No rales.  Abdominal: Soft. Bowel sounds are normal. Exhibits no distension and no mass. There is no tenderness.  Musculoskeletal: Normal range of motion. Exhibits no edema.  Lymphadenopathy:    No cervical adenopathy.  Neurological: Alert and oriented to person, place, and time. Exhibits normal muscle tone. Gait normal. Coordination normal.  Skin: Skin is warm and dry. No rash noted. Not diaphoretic. No erythema. No pallor.  Psychiatric: Mood, memory and judgment normal.  Vitals reviewed.  LABORATORY DATA: Lab Results  Component Value Date   WBC 5.7 03/25/2021   HGB 12.1 03/25/2021   HCT 36.3 03/25/2021   MCV 101.4 (H) 03/25/2021   PLT 213 03/25/2021      Chemistry      Component Value Date/Time   NA 140 03/25/2021 0906   K 3.8 03/25/2021 0906   CL 105 03/25/2021 0906   CO2 30 03/25/2021 0906   BUN 18 03/25/2021 0906   CREATININE 0.67 03/25/2021 0906      Component Value Date/Time   CALCIUM 9.5 03/25/2021 0906   ALKPHOS 37 (L) 03/25/2021 0906   AST 21 03/25/2021 0906   ALT 18 03/25/2021 0906   BILITOT 0.5 03/25/2021 0906       RADIOGRAPHIC STUDIES:  No results found.   ASSESSMENT/PLAN:  80 y.o. female  with   1. Multiple Myeloma with IgG Lambda specificity Labs upon initial presentation from 12/01/18 CBC w/diff revealed HGB at 10.4 with MCV of 105.7. 11/28/18 CMP revealed Creatinine normal at 0.68 and Calcium normal at 8.9. 11/28/18 Beta 2 microglobulin at 2.6mg  (also a reading at 4.7mg  on the same day). 11/30/18 24HR UPEP revealed M spike at 75.1%. 11/28/18 SPEP revealed M spike of 4.6g. 11/28/18 Immunoglobulins revealed IgG elevated at 6181mg . 12/01/18 BM Bx revealed hypercellular bone marrow with 70-80% CD138 immunohistochemistry (44% aspirate) lambda-restricted plasma cells as well as a kappa restricted population of B-cells 11/27/18 MRI Thoracic Spine which revealed Multifocal marrow signal abnormality consistent with metastatic disease or multiple myeloma. The patient has multiple compression fractures most consistent with pathologic injuries. Extensive marrow signal abnormality makes determining age of the fractures difficult but edema is most intense in T3. 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 2. Areas of hypermetabolic disease identified in both lungs suggesting multiple myeloma involvement. 3. 1.9 cm calcified left thyroid nodule is hypermetabolic, but Indeterminate. 4. Colon is diffusely distended with gas and stool. Imaging features would be compatible with clinical constipation. 5.  Aortic Atherosclerois.   Pt describes grade II to III rash on her re-challenge from Revlimid with optimized pre-medications, and we discontinued Revlimid   Began Cytoxan with C4 Velcade and Dexamethasone   05/16/2019 M spike is down to 0.2g/dl showing continued  improvement and a 90% reduction   -07/14/2019 BM Bx revealed BONE MARROW: - Hypercellular marrow with residual plasma cell neoplasm (<10%) PERIPHERAL BLOOD: - Pancytopenia".   07/17/2019 PET/CT Whole Body  Scan ([id number]) revealed "1. 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 2. Hypermetabolic calcified left thyroid nodule, as before, indeterminate. 3. Aortic atherosclerosis (ICD10-170.0). Coronary artery calcification."   09/11/2019 Bone Marrow Report ([id number]) revealed "BONE MARROW, ASPIRATE, CLOT, CORE:  -Variably cellular bone marrow with trilineage hematopoiesis and 2% plasma cells PERIPHERAL BLOOD:  -Pancytopenia".   09/11/2019 Flow Pathology Report [id number]) revealed "-Predominance of T lymphocytes with relative abundance of CD8 positive cells  -No significant B-cell population identified -Relative abundance of natural killer cells".   09/14/2019 PET/CT ([id number]) revealed "1. No findings of active malignancy. 2. Numerous thoracolumbar compression fractures many of which may be associated with osteoporosis. 3. Calcified 1.5 cm in diameter left thyroid nodule with persistent elevated activity. A significant minority of hypermetabolic thyroid nodules can be malignant, if not previously worked up to then thyroid ultrasound would be recommended as a next step. 4. There a few small lucent and small sclerotic lesions in the skeleton which are not hypermetabolic, possibilities include benign lesions or previously effectively treated myeloma. 5. Other imaging findings of potential clinical significance: Aortic Atherosclerosis (ICD10-I70.0). Coronary atherosclerosis. Prominent stool throughout the colon favors constipation. 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 She does not currently have elevated lymphocyte counts on peripheral blood draws.    PLAN: -Discussed pt labwork with the patient today, 03/25/2021; Consulted with Dr. Lorenso Courier. m-protein gradually increasing, chemistries normal and counts stable. Light chains show increasing light chains. Per Dr. Grier Mitts last note "Advised pt that there needs to be a change of 0.5 in the m-protein prior baseline to consider it progressive."  -Discussed she is just over that threshold. I will make her a follow up with Dr. Irene Limbo when he returns to the clinic to discuss this further about whether he would recommend further evaluation and/or pursuing treatment vs close monitoring. She is concerned about the slowly progressive increase in her m protein and light chains. -For now, her labs are acceptable to proceed with Zometa today as scheduled.  -She also will receive Evusheld today as scheduled. I answered her questions regarding this to the best of my ability. Discussed that no monitoring or follow up needed specifically for evusheld. We also discussed there is no blood test to check the effectiveness of evusheld.  -Continue 12.5 mcg/hr Fentanyl patch & Oxycodone prn for pain management. -Continue 5000 UT Vitamin D daily. Goal Vitamin D is 60 <>90 . -Continue Zometa q8weeks. -Will see back in 1 week for follow up with Dr. Irene Limbo      No orders of the defined types were placed in this encounter.    The total time spent in the appointment was 20-29 minutes.  Farmer Mccahill L Koa Zoeller, PA-C 04/01/21

## 2021-04-01 ENCOUNTER — Inpatient Hospital Stay: Payer: Medicare HMO | Admitting: Physician Assistant

## 2021-04-01 ENCOUNTER — Inpatient Hospital Stay: Payer: Medicare HMO

## 2021-04-01 ENCOUNTER — Ambulatory Visit: Payer: Medicare HMO | Admitting: Hematology

## 2021-04-01 ENCOUNTER — Other Ambulatory Visit: Payer: Self-pay

## 2021-04-01 VITALS — BP 153/87 | HR 63 | Temp 98.4°F | Resp 16

## 2021-04-01 VITALS — BP 185/89 | HR 68 | Temp 97.5°F | Resp 17 | Wt 110.9 lb

## 2021-04-01 DIAGNOSIS — Z79899 Other long term (current) drug therapy: Secondary | ICD-10-CM | POA: Diagnosis not present

## 2021-04-01 DIAGNOSIS — D61818 Other pancytopenia: Secondary | ICD-10-CM | POA: Diagnosis not present

## 2021-04-01 DIAGNOSIS — C9000 Multiple myeloma not having achieved remission: Secondary | ICD-10-CM | POA: Diagnosis not present

## 2021-04-01 DIAGNOSIS — E041 Nontoxic single thyroid nodule: Secondary | ICD-10-CM | POA: Diagnosis not present

## 2021-04-01 DIAGNOSIS — Z7982 Long term (current) use of aspirin: Secondary | ICD-10-CM | POA: Diagnosis not present

## 2021-04-01 DIAGNOSIS — Z7189 Other specified counseling: Secondary | ICD-10-CM

## 2021-04-01 DIAGNOSIS — Z803 Family history of malignant neoplasm of breast: Secondary | ICD-10-CM | POA: Diagnosis not present

## 2021-04-01 DIAGNOSIS — R21 Rash and other nonspecific skin eruption: Secondary | ICD-10-CM | POA: Diagnosis not present

## 2021-04-01 DIAGNOSIS — Z87891 Personal history of nicotine dependence: Secondary | ICD-10-CM | POA: Diagnosis not present

## 2021-04-01 DIAGNOSIS — Z801 Family history of malignant neoplasm of trachea, bronchus and lung: Secondary | ICD-10-CM | POA: Diagnosis not present

## 2021-04-01 DIAGNOSIS — M8580 Other specified disorders of bone density and structure, unspecified site: Secondary | ICD-10-CM | POA: Diagnosis not present

## 2021-04-01 DIAGNOSIS — K5900 Constipation, unspecified: Secondary | ICD-10-CM | POA: Diagnosis not present

## 2021-04-01 DIAGNOSIS — I10 Essential (primary) hypertension: Secondary | ICD-10-CM | POA: Diagnosis not present

## 2021-04-01 DIAGNOSIS — Z298 Encounter for other specified prophylactic measures: Secondary | ICD-10-CM | POA: Diagnosis not present

## 2021-04-01 DIAGNOSIS — I700 Atherosclerosis of aorta: Secondary | ICD-10-CM | POA: Diagnosis not present

## 2021-04-01 DIAGNOSIS — I2510 Atherosclerotic heart disease of native coronary artery without angina pectoris: Secondary | ICD-10-CM | POA: Diagnosis not present

## 2021-04-01 MED ORDER — ZOLEDRONIC ACID 4 MG/100ML IV SOLN
4.0000 mg | Freq: Once | INTRAVENOUS | Status: AC
  Administered 2021-04-01: 4 mg via INTRAVENOUS

## 2021-04-01 MED ORDER — CILGAVIMAB (PART OF EVUSHELD) INJECTION
300.0000 mg | Freq: Once | INTRAMUSCULAR | Status: AC
  Administered 2021-04-01: 300 mg via INTRAMUSCULAR
  Filled 2021-04-01: qty 3

## 2021-04-01 MED ORDER — EPINEPHRINE 0.3 MG/0.3ML IJ SOAJ: 0.3000 mg | Freq: Once | INTRAMUSCULAR | Status: DC | PRN

## 2021-04-01 MED ORDER — SODIUM CHLORIDE 0.9 % IV SOLN
Freq: Once | INTRAVENOUS | Status: AC
Start: 2021-04-01 — End: 2021-04-01
  Filled 2021-04-01: qty 250

## 2021-04-01 MED ORDER — ZOLEDRONIC ACID 4 MG/100ML IV SOLN
INTRAVENOUS | Status: AC
  Filled 2021-04-01: qty 100

## 2021-04-01 MED ORDER — TIXAGEVIMAB (PART OF EVUSHELD) INJECTION
300.0000 mg | Freq: Once | INTRAMUSCULAR | Status: AC
  Administered 2021-04-01: 300 mg via INTRAMUSCULAR
  Filled 2021-04-01: qty 3

## 2021-04-01 NOTE — Progress Notes (Signed)
Patient was monitored for 1 hour following Evusheld injection with no adverse reaction. Vitals stable and patient in no distress upon leaving infusion room.

## 2021-04-01 NOTE — Patient Instructions (Addendum)

## 2021-04-10 NOTE — Progress Notes (Signed)
HEMATOLOGY/ONCOLOGY CLINIC NOTE  Date of Service: 04/11/2021  Patient Care Team: Shelva Majestic, MD as PCP - General (Family Medicine) Johney Maine, MD as Consulting Physician (Hematology) Bond, Doran Stabler, MD as Referring Physician (Ophthalmology) Zigmund Daniel, Maureen Ralphs, MD as Consulting Physician (Hematology and Oncology) Carlus Pavlov, MD as Consulting Physician (Internal Medicine) Darnell Level, MD as Consulting Physician (General Surgery)  Dr. Rhett Bannister as [place] Specialist  [phone]  CHIEF COMPLAINTS/PURPOSE OF CONSULTATION:   Multiple Myeloma- continued management  HISTORY OF PRESENTING ILLNESS:   Megan Olson is a wonderful 80 y.o. female who has been referred to [place] by Dr. 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 She is accompanied today by her husband. The pt reports that she is doing well overall.   The pt notes that she was doing extensive yard work in October 2019, and began to feel back pain a few days after this, which she attributed to muscle pain. She recalls taking a deep breath, and developing sudden acute pain, and presented to care with her PCP's office. She began exercises for her back pain, without relief, then began PT without relief, then was referred to Dr. Berline Chough in sports medicine in late January 2020. She had an XR which revealed a compression fracture at T11, then subsequently had an MRI, and a bone marrow biopsy. The pt notes that her back pain "seemed to move around." She endorses pain "up the whole left side" of her back.  The pt notes that she is not able to stand up straight due to her back pain, which she feels limits her ability to take a deep breath, and endorses pain exacerbation when she does take a deep breath. The pt needs assistance from sitting to lying from her husband. She is using about 3 Percocet a day.  The pt notes worsened constipation since  beginning Percocet, and notes that her most recent laxative order was not covered by her insurance. She is using prune juice and milk of magnesia. She took Senokot S BID without relief.  The pt reports that prior to her recent back pain, she had very few medical concerns. She endorses controlled BP, and has been monitored for DM but after closely watching her diet her A1C decreased to 5.8. She has glaucoma, and has had surgery in both eyes. Right eye with stent. The pt sees Dr. Lottie Dawson at [facility] for her eye care. The pt notes that her vision has been recently "pretty good." The pt notes that she has been advised to limit treatment with steroids for her glaucoma. She denies heart or lung problems. Denies previous back problems. Last DEXA scan 3 years ago, and endorses osteopenia. She notes that she took Fosamax for 3-4 years, and stopped 5-6 years ago. She takes Vitamin D, a multivitamin, and magnesium.  The pt notes that she quit smoking cigarettes when she was 27, started when she was about 20. The pt consumes alcohol rarely, but not since beginning narcotics. 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  Of note prior to the patient's visit today, pt has had an MRI Thoracic Spine completed on 11/27/18 with results revealing Multifocal marrow signal abnormality consistent with metastatic disease or multiple myeloma. The patient has multiple compression fractures most consistent with pathologic injuries. Extensive marrow signal abnormality makes determining age of the fractures difficult but edema is most intense in T3. Epidural tumor centrally and to the left posterior to T3  extends into the left neural foramen and could impact the left T3 root.  Most recent lab results (12/01/18) of CBC w/diff is as follows: all values are WNL except for RBC at 3.17, HGB at 10.4, HCT at 33.5, MCV at 105.7. 11/28/18 CMP revealed all values WNL except for Glucose at 105, Total Protein at 10.2, Albumin at 3.1 11/30/18 24HR  UPEP revealed all values WNL except for M-spike at 75.1%. 11/28/18 Beta-2 microglobulin slightly elevated at 2.6 11/28/18 SPEP revealed M spike at 4.6g 11/28/18 Immunoglobulins revealed IgG at 6181, IgA at 24, IgM at 16, and IgE at 6.  On review of systems, pt reports constipation, back pain, stable energy levels, ankle swelling, tenderness at T3, lower back pain, and denies abdominal pains, neck pain, and any other symptoms.   On PMHx the pt reports redundant colon, single hemorrhoid, glaucoma, HTN, osteopenia, Tonsillectomy, right eye stent and multiple eye surgeries. On Social Hx the pt reports rare alcohol use, smoked cigarettes between ages 72 and 42. 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 On Family Hx the pt reports sister died from small cell lung cancer and was a lifelong smoker, brother's daughter died of breast cancer with BRCA1 and BRCA2 mutations. Cousin with female breast cancer.  INTERVAL HISTORY:   Megan Olson returns today for management and evaluation of her multiple myeloma. The patient's last visit with [place] was on 01/29/2021. The pt reports that she is doing well overall.  The pt reports that she received the Evusheld recently. She notes the pain of the shot was bad during the time, but no pain or issues after. The patient has had no new symptoms or concerns.  Lab results 03/25/2021 of CBC w/diff and CMP is as follows: all values are WNL except for RBC of 3.58, MCV of 101.4, Lymphs Abs of 0.5K, Glucose of 109, Alkaline Phosphatase of 37. 03/25/2021 Lambda free light chains of 32.2. 03/25/2021 MMP WNL except IGA of 32, m-protein of 0.6.  On review of systems, pt denies abdominal pain, leg swelling, and any other symptoms.   MEDICAL HISTORY:  Past Medical History:  Diagnosis Date   Anemia    Glaucoma    HYPERTENSION 03/11/2007   Multiple myeloma ([place])    OSTEOPENIA 03/11/2007   Pre-diabetes    Thyroid cancer ([place])    Thyroid disease    Tubular adenoma of colon 07/2015     SURGICAL HISTORY: Past Surgical History:  Procedure Laterality Date   BONE MARROW BIOPSY     multiple   BREAST EXCISIONAL BIOPSY Right 2000   BREAST LUMPECTOMY  1990   benign   CATARACT EXTRACTION Bilateral 2018   DILATION AND CURETTAGE OF UTERUS     bleeding at menopause. No uterine cancer   IR RADIOLOGIST EVAL & MGMT  12/13/2018   THYROIDECTOMY N/A 08/02/2020   Procedure: TOTAL THYROIDECTOMY;  Surgeon: [name], MD;  Location: [facility];  Service: General;  Laterality: N/A;   TONSILLECTOMY     age 42    SOCIAL HISTORY: Social History   Socioeconomic History   Marital status: Married    Spouse name: Not on file   Number of children: 1   Years of education: Not on file   Highest education level: Not on file  Occupational History   Occupation: Retired  Tobacco Use   Smoking status: Former    Packs/day: 0.50    Years: 7.00    Pack years: 3.50    Types: Cigarettes    Quit date: 01/04/1961  Years since quitting: 60.3   Smokeless tobacco: Never  Vaping Use   Vaping Use: Never used  Substance and Sexual Activity   Alcohol use: Not Currently    Alcohol/week: 1.0 standard drink    Types: 1 Standard drinks or equivalent per week    Comment: occas    Drug use: No   Sexual activity: Not Currently  Other Topics Concern   Not on file  Social History Narrative   Married. Lives with husband (patient of Dr. Yong Channel). 1 son. No grandkids. 1 granddog.       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, swing dancing, dinner, read      Social Determinants of Radio broadcast assistant Strain: Not on file  Food Insecurity: Not on file  Transportation Needs: Not on file  Physical Activity: Not on file  Stress: Not on file  Social Connections: Not on file  Intimate Partner Violence: Not on file    FAMILY HISTORY: Family History  Problem Relation Age of Onset   Heart disease Mother         CHF mother died 38   Arthritis Mother    Glaucoma Mother        sister as well   Alcohol abuse Father    Suicidality Father    Heart disease Sister        aortic valve replacement   Lung cancer Sister        smoker   Hyperlipidemia Brother    Hypertension Brother    COPD Brother    Colon polyps Brother    Arthritis Sister    Hypertension Sister    Glaucoma Sister    Hashimoto's thyroiditis Sister    Colon polyps Sister    Hypertension Son    Stroke Maternal Grandmother    Cystic fibrosis Niece    Colon cancer Neg Hx     ALLERGIES:  is allergic to ace inhibitors, benadryl [diphenhydramine], diamox [acetazolamide], sulfamethoxazole, lenalidomide, and penicillins.   MEDICATIONS:  Current Outpatient Medications  Medication Sig Dispense Refill   acyclovir (ZOVIRAX) 400 MG tablet Take 1 tablet (400 mg total) by mouth 2 (two) times daily. 60 tablet 3   ALPHAGAN P 0.1 % SOLN Place 1 drop into both eyes in the morning, at noon, and at bedtime.      amLODipine (NORVASC) 5 MG tablet Take 1 tablet (5 mg total) by mouth at bedtime. 90 tablet 3   aspirin EC 81 MG tablet Take 81 mg by mouth daily.     bimatoprost (LUMIGAN) 0.03 % ophthalmic solution Place 1 drop into both eyes at bedtime.      Cholecalciferol (VITAMIN D3) 125 MCG (5000 UT) TABS Take 5,000 Units by mouth daily.     Co-Enzyme Q-10 100 MG CAPS Take 100 mg by mouth daily.     dorzolamide-timolol (COSOPT) 22.3-6.8 MG/ML ophthalmic solution Place 1 drop into both eyes 2 (two) times daily.   11   fentaNYL (DURAGESIC) 12 MCG/HR Place 1 patch onto the skin every 3 (three) days. 10 patch 0   hydrOXYzine (ATARAX/VISTARIL) 25 MG tablet Take 1 tablet (25 mg total) by mouth 3 (three) times daily as needed. 30 tablet 0   ixazomib citrate (NINLARO) 3 MG capsule TAKE 1 CAP (3 MG) BY MOUTH WEEKLY, 3 WEEKS ON, 1 WEEK OFF, REPEAT EVERY 4 WEEKS. TAKE ON AN EMPTY STOMACH 1HR BEFORE OR 2HR  AFTER MEAL 3 capsule 2   levothyroxine (SYNTHROID)  75 MCG tablet Take 1 tablet (75 mcg total) by mouth daily before breakfast. 90 tablet 3   Multiple Vitamins-Minerals (MULTIVITAMIN WITH MINERALS) tablet Take 1 tablet by mouth daily. Centrum Silver 50 +     Omega-3 1000 MG CAPS Take 1,000 mg by mouth daily.      oxyCODONE-acetaminophen (PERCOCET) 5-325 MG tablet Take 1-2 tablets by mouth every 4 (four) hours as needed for moderate pain or severe pain. 90 tablet 0   polyethylene glycol (MIRALAX) packet Take 17 g by mouth daily. 30 each 1   senna-docusate (SENOKOT-S) 8.6-50 MG tablet Take 2 tablets by mouth at bedtime. 60 tablet 2   Simethicone (GAS-X PO) Take 1 tablet by mouth as needed.     vitamin C (ASCORBIC ACID) 500 MG tablet Take 500 mg by mouth 2 (two) times a week.      No current facility-administered medications for this visit.    REVIEW OF SYSTEMS:   10 Point review of Systems was done is negative except as noted above.  PHYSICAL EXAMINATION: ECOG PERFORMANCE STATUS: 1-2  Vitals:   04/11/21 1516  BP: (!) 186/79  Pulse: 64  Resp: 17  Temp: 98.4 F (36.9 C)  SpO2: 100%    Filed Weights   04/11/21 1516  Weight: 113 lb 12.8 oz (51.6 kg)    Body mass index is 21.86 kg/m.   NAD. GENERAL:alert, in no acute distress and comfortable SKIN: no acute rashes, no significant lesions EYES: conjunctiva are pink and non-injected, sclera anicteric OROPHARYNX: MMM, no exudates, no oropharyngeal erythema or ulceration NECK: supple, no JVD LYMPH:  no palpable lymphadenopathy in the cervical, axillary or inguinal regions LUNGS: clear to auscultation b/l with normal respiratory effort HEART: regular rate & rhythm ABDOMEN:  normoactive bowel sounds , non tender, not distended. Extremity: no pedal edema PSYCH: alert & oriented x 3 with fluent speech NEURO: no focal motor/sensory deficits   LABORATORY DATA:  I have reviewed the data as listed  . CBC Latest Ref Rng & Units 03/25/2021 01/22/2021 11/27/2020  WBC 4.0 - 10.5 K/uL 5.7  4.4 5.4  Hemoglobin 12.0 - 15.0 g/dL 12.1 11.8(L) 12.3  Hematocrit 36.0 - 46.0 % 36.3 36.2 37.9  Platelets 150 - 400 K/uL 213 212 204    . CMP Latest Ref Rng & Units 03/25/2021 01/22/2021 11/27/2020  Glucose 70 - 99 mg/dL 109(H) 79 102(H)  BUN 8 - 23 mg/dL $Remove'18 14 15  'jGQUgpO$ Creatinine 0.44 - 1.00 mg/dL 0.67 0.72 0.77  Sodium 135 - 145 mmol/L 140 142 142  Potassium 3.5 - 5.1 mmol/L 3.8 3.9 4.2  Chloride 98 - 111 mmol/L 105 105 106  CO2 22 - 32 mmol/L $RemoveB'30 29 26  'uEuvyoHj$ Calcium 8.9 - 10.3 mg/dL 9.5 9.2 9.2  Total Protein 6.5 - 8.1 g/dL 7.1 7.0 7.1  Total Bilirubin 0.3 - 1.2 mg/dL 0.5 0.4 0.4  Alkaline Phos 38 - 126 U/L 37(L) 34(L) 33(L)  AST 15 - 41 U/L $Remo'21 18 18  'thYIF$ ALT 0 - 44 U/L $Remo'18 14 16   'JlDge$ 09/11/2019 Flow Pathology Report [id number]):   09/11/2019 Bone Marrow Report [phone]):    07/14/2019 Cytogenetics [id number]):    07/14/2019 Flow Pathology Report [id number]):   07/14/2019 BM Bx ([id number]):    12/01/18 BM Bx:   12/01/18 Flow Cytometry:       RADIOGRAPHIC STUDIES: I have personally reviewed the radiological images as listed and agreed with the findings in the report.  No results found.   ASSESSMENT & PLAN:  80 y.o. female with  1. Multiple Myeloma with IgG Lambda specificity Labs upon initial presentation from 12/01/18 CBC w/diff revealed HGB at 10.4 with MCV of 105.7. 11/28/18 CMP revealed Creatinine normal at 0.68 and Calcium normal at 8.9. 11/28/18 Beta 2 microglobulin at 2.6mg  (also a reading at 4.7mg  on the same day). 11/30/18 24HR UPEP revealed M spike at 75.1%. 11/28/18 SPEP revealed M spike of 4.6g. 11/28/18 Immunoglobulins revealed IgG elevated at 6181mg .  12/01/18 BM Bx revealed hypercellular bone marrow with 70-80% CD138 immunohistochemistry (44% aspirate) lambda-restricted plasma cells as well as a kappa restricted population of B-cells 11/27/18 MRI Thoracic Spine which revealed Multifocal marrow signal abnormality consistent with metastatic disease or  multiple myeloma. The patient has multiple compression fractures most consistent with pathologic injuries. Extensive marrow signal abnormality makes determining age of the fractures difficult but edema is most intense in T3. 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 2. Areas of hypermetabolic disease identified in both lungs suggesting multiple myeloma involvement. 3. 1.9 cm calcified left thyroid nodule is hypermetabolic, but Indeterminate. 4. Colon is diffusely distended with gas and stool. Imaging features would be compatible with clinical constipation. 5.  Aortic Atherosclerois.  Pt describes grade II to III rash on her re-challenge from Revlimid with optimized pre-medications, and we discontinued Revlimid  Began Cytoxan with C4 Velcade and Dexamethasone  05/16/2019 M spike is down to 0.2g/dl showing continued improvement and a 90% reduction  -07/14/2019 BM Bx revealed BONE MARROW: - Hypercellular marrow with residual plasma cell neoplasm (<10%) PERIPHERAL BLOOD: - Pancytopenia".   07/17/2019 PET/CT Whole Body Scan ([id number]) revealed "1. 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 2. Hypermetabolic calcified left thyroid nodule, as before, indeterminate. 3. Aortic atherosclerosis (ICD10-170.0). Coronary artery calcification."  09/11/2019 Bone Marrow Report ([id number]) revealed "BONE MARROW, ASPIRATE, CLOT, CORE:  -Variably cellular bone marrow with trilineage hematopoiesis and 2% plasma cells PERIPHERAL BLOOD:  -Pancytopenia".  09/11/2019 Flow Pathology Report [id number]) revealed  "-Predominance of T lymphocytes with relative abundance of CD8 positive cells  -No significant B-cell population identified -Relative abundance of natural killer cells".  09/14/2019 PET/CT ([id number]) revealed "1. No findings of active malignancy. 2. Numerous thoracolumbar compression fractures many of which may be associated with osteoporosis. 3. Calcified 1.5 cm in diameter left thyroid nodule with persistent elevated activity. A significant minority of hypermetabolic thyroid nodules can be malignant, if not previously worked up to then thyroid ultrasound would be recommended as a next step. 4. There a few small lucent and small sclerotic lesions in the skeleton which are not hypermetabolic, possibilities include benign lesions or previously effectively treated myeloma. 5. Other imaging findings of potential clinical significance: Aortic Atherosclerosis (ICD10-I70.0). Coronary atherosclerosis. Prominent stool throughout the colon favors constipation. Hyperdense right renal lesion is probably a complex cyst but technically nonspecific."  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 She does not currently have elevated lymphocyte counts on peripheral blood draws.   PLAN: -  Discussed pt labwork, 03/25/2021; m-protein slightly increasing, counts stable, no anemia, chemistries stable, light chains normal. -Advised pt that we would need to reasess where we stand with a repeat Bm Bx and PET since there was an increase of m protein of over 0.5 in six months. -Advised pt we are switching to Carfilzomib (D1,D8 and D15 regimen every 28 days) as next (2nd line treatment) -This treatment would be combined with Dexamethasone (dose reduced to $RemoveBe'20mg'ldOfNZjSR$  due to glaucoma consideration, will further dose reduce after C1 if Carfilzomib tolerated  well). -The pt has no prohibitive toxicities from continuing Ninlaro at this time. -Continue 12.5 mcg/hr Fentanyl patch & Oxycodone prn for pain management. -Continue 5000 UT Vitamin D daily. Goal Vitamin D is 60 <>90 . -Continue Zometa q8weeks. -Will get PET and Bm Bx in 1-2 weeks as baseline for treatment change. -Will start new treatment in 2 weeks. -Will see back in 2 weeks with C1D1.   FOLLOW UP: CT bone marrow biopsy in 1 week  PET/CT in 1 week Schedule to start Carfilzomib in 2 weeks with portflush and labs and MD visit    The total time spent in the appointment was 30 minutes and more than 50% was on counseling and direct patient cares, ordering and co-ordination new 2nd line treatment.  All of the patient's questions were answered with apparent satisfaction. The patient knows to call the clinic with any problems, questions or concerns.    Sullivan Lone MD [place] AAHIVMS [facility] Hematology/Oncology Physician [facility]  (Office):       [phone] (Work cell):  [phone] (Fax):           [phone]  04/11/2021 4:10 PM  I, Reinaldo Raddle, am acting as scribe for Dr. Sullivan Lone, MD.     .I have reviewed the above documentation for accuracy and completeness, and I agree with the above. [name] MD

## 2021-04-11 ENCOUNTER — Other Ambulatory Visit: Payer: Self-pay

## 2021-04-11 ENCOUNTER — Inpatient Hospital Stay: Payer: Medicare HMO | Attending: Hematology | Admitting: Hematology

## 2021-04-11 VITALS — BP 186/79 | HR 64 | Temp 98.4°F | Resp 17 | Wt 113.8 lb

## 2021-04-11 DIAGNOSIS — Z801 Family history of malignant neoplasm of trachea, bronchus and lung: Secondary | ICD-10-CM | POA: Diagnosis not present

## 2021-04-11 DIAGNOSIS — Z87891 Personal history of nicotine dependence: Secondary | ICD-10-CM | POA: Insufficient documentation

## 2021-04-11 DIAGNOSIS — Z7982 Long term (current) use of aspirin: Secondary | ICD-10-CM | POA: Insufficient documentation

## 2021-04-11 DIAGNOSIS — M8580 Other specified disorders of bone density and structure, unspecified site: Secondary | ICD-10-CM | POA: Insufficient documentation

## 2021-04-11 DIAGNOSIS — I10 Essential (primary) hypertension: Secondary | ICD-10-CM | POA: Insufficient documentation

## 2021-04-11 DIAGNOSIS — C9000 Multiple myeloma not having achieved remission: Secondary | ICD-10-CM | POA: Diagnosis not present

## 2021-04-11 DIAGNOSIS — C9002 Multiple myeloma in relapse: Secondary | ICD-10-CM

## 2021-04-11 DIAGNOSIS — K5900 Constipation, unspecified: Secondary | ICD-10-CM | POA: Diagnosis not present

## 2021-04-11 DIAGNOSIS — Z5112 Encounter for antineoplastic immunotherapy: Secondary | ICD-10-CM | POA: Diagnosis present

## 2021-04-11 DIAGNOSIS — M545 Low back pain, unspecified: Secondary | ICD-10-CM | POA: Insufficient documentation

## 2021-04-11 DIAGNOSIS — Z79899 Other long term (current) drug therapy: Secondary | ICD-10-CM | POA: Diagnosis not present

## 2021-04-11 DIAGNOSIS — Z7189 Other specified counseling: Secondary | ICD-10-CM

## 2021-04-16 ENCOUNTER — Other Ambulatory Visit: Payer: Self-pay | Admitting: Hematology

## 2021-04-16 ENCOUNTER — Encounter: Payer: Self-pay | Admitting: Hematology

## 2021-04-16 DIAGNOSIS — C9002 Multiple myeloma in relapse: Secondary | ICD-10-CM | POA: Insufficient documentation

## 2021-04-16 MED ORDER — ONDANSETRON HCL 8 MG PO TABS: ORAL_TABLET | ORAL | 1 refills | Status: DC

## 2021-04-16 MED ORDER — PROCHLORPERAZINE MALEATE 10 MG PO TABS: 10.0000 mg | ORAL_TABLET | Freq: Four times a day (QID) | ORAL | 1 refills | Status: DC | PRN

## 2021-04-16 MED ORDER — DEXAMETHASONE 4 MG PO TABS: ORAL_TABLET | ORAL | 3 refills | Status: DC

## 2021-04-16 NOTE — Progress Notes (Signed)
DISCONTINUE ON PATHWAY REGIMEN - Multiple Myeloma and Other Plasma Cell Dyscrasias     A cycle is every 21 days:     Bortezomib      Lenalidomide      Dexamethasone   **Always confirm dose/schedule in your pharmacy ordering system**  REASON: Disease Progression PRIOR TREATMENT: [id number]: VRd (Bortezomib 1.3 mg/m2 Subcut D1, 4, 8, 11 + Lenalidomide 25 mg + Dexamethasone 20 mg) q21 Days x 4-6 Cycles Maximum Prior to Stem Cell [employer]vest TREATMENT RESPONSE: Complete Response (CR)  START ON PATHWAY REGIMEN - Multiple Myeloma and Other Plasma Cell Dyscrasias     Cycle 1: A cycle is 28 days:     Cyclophosphamide      Dexamethasone      Carfilzomib      Carfilzomib    Cycles 2 through 6: A cycle is every 28 days:     Cyclophosphamide      Dexamethasone      Carfilzomib   **Always confirm dose/schedule in your pharmacy ordering system**  Patient Characteristics: Multiple Myeloma, Relapsed / Refractory, Second through Fourth Lines of Therapy, Fit or Candidate for Triplet Therapy, Lenalidomide-Refractory or Lenalidomide-based Regimen Not Preferred, Not a Candidate for Anti-CD38 Antibody Disease Classification: Multiple Myeloma R-ISS Staging: Unknown Therapeutic Status: Relapsed Line of Therapy: Second Line Anti-CD38 Antibody Candidacy: Not a Candidate for Anti-CD38 Antibody Lenalidomide-based Regimen Preference/Candidacy: Lenalidomide-based Regimen Not Preferred Intent of Therapy: Non-Curative / Palliative Intent, Discussed with Patient

## 2021-04-18 ENCOUNTER — Telehealth: Payer: Self-pay | Admitting: Hematology

## 2021-04-18 NOTE — Telephone Encounter (Signed)
Scheduled follow-up appointments per 7/8 los. Patient is aware.

## 2021-04-21 ENCOUNTER — Other Ambulatory Visit (HOSPITAL_COMMUNITY): Payer: Self-pay

## 2021-04-23 ENCOUNTER — Other Ambulatory Visit: Payer: Self-pay

## 2021-04-23 DIAGNOSIS — C9000 Multiple myeloma not having achieved remission: Secondary | ICD-10-CM

## 2021-04-23 NOTE — Progress Notes (Signed)
Pharmacist Chemotherapy Monitoring - Initial Assessment    Anticipated start date: 04/30/21   The following has been reviewed per standard work regarding the patient's treatment regimen: The patient's diagnosis, treatment plan and drug doses, and organ/hematologic function Lab orders and baseline tests specific to treatment regimen  The treatment plan start date, drug sequencing, and pre-medications Prior authorization status  Patient's documented medication list, including drug-drug interaction screen and prescriptions for anti-emetics and supportive care specific to the treatment regimen The drug concentrations, fluid compatibility, administration routes, and timing of the medications to be used The patient's access for treatment and lifetime cumulative dose history, if applicable  The patient's medication allergies and previous infusion related reactions, if applicable   Changes made to treatment plan:  N/A  Follow up needed:  Pending authorization for treatment     Kennith Center, Pharm.D., CPP 04/23/2021@1 :58 PM

## 2021-04-25 ENCOUNTER — Other Ambulatory Visit: Payer: Self-pay

## 2021-04-28 ENCOUNTER — Encounter: Payer: Self-pay | Admitting: Hematology

## 2021-04-28 ENCOUNTER — Other Ambulatory Visit: Payer: Self-pay

## 2021-04-28 DIAGNOSIS — C9002 Multiple myeloma in relapse: Secondary | ICD-10-CM

## 2021-04-28 MED ORDER — FENTANYL 12 MCG/HR TD PT72: 1.0000 | MEDICATED_PATCH | TRANSDERMAL | 0 refills | Status: DC

## 2021-04-29 NOTE — Progress Notes (Signed)
HEMATOLOGY/ONCOLOGY CLINIC NOTE  Date of Service: .04/30/2021   Patient Care Team: Shelva Majestic, MD as PCP - General (Family Medicine) Johney Maine, MD as Consulting Physician (Hematology) Bond, Doran Stabler, MD as Referring Physician (Ophthalmology) Zigmund Daniel, Maureen Ralphs, MD as Consulting Physician (Hematology and Oncology) Carlus Pavlov, MD as Consulting Physician (Internal Medicine) Darnell Level, MD as Consulting Physician (General Surgery)  Dr. Rhett Bannister as [place] Specialist  [phone]  CHIEF COMPLAINTS/PURPOSE OF CONSULTATION:   Multiple Myeloma- continued management  HISTORY OF PRESENTING ILLNESS:   Megan Olson is a wonderful 80 y.o. female who has been referred to [place] by Dr. 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 She is accompanied today by her husband. The pt reports that she is doing well overall.   The pt notes that she was doing extensive yard work in October 2019, and began to feel back pain a few days after this, which she attributed to muscle pain. She recalls taking a deep breath, and developing sudden acute pain, and presented to care with her PCP's office. She began exercises for her back pain, without relief, then began PT without relief, then was referred to Dr. Berline Chough in sports medicine in late January 2020. She had an XR which revealed a compression fracture at T11, then subsequently had an MRI, and a bone marrow biopsy. The pt notes that her back pain "seemed to move around." She endorses pain "up the whole left side" of her back.  The pt notes that she is not able to stand up straight due to her back pain, which she feels limits her ability to take a deep breath, and endorses pain exacerbation when she does take a deep breath. The pt needs assistance from sitting to lying from her husband. She is using about 3 Percocet a day.  The pt notes worsened constipation since  beginning Percocet, and notes that her most recent laxative order was not covered by her insurance. She is using prune juice and milk of magnesia. She took Senokot S BID without relief.  The pt reports that prior to her recent back pain, she had very few medical concerns. She endorses controlled BP, and has been monitored for DM but after closely watching her diet her A1C decreased to 5.8. She has glaucoma, and has had surgery in both eyes. Right eye with stent. The pt sees Dr. Lottie Dawson at [facility] for her eye care. The pt notes that her vision has been recently "pretty good." The pt notes that she has been advised to limit treatment with steroids for her glaucoma. She denies heart or lung problems. Denies previous back problems. Last DEXA scan 3 years ago, and endorses osteopenia. She notes that she took Fosamax for 3-4 years, and stopped 5-6 years ago. She takes Vitamin D, a multivitamin, and magnesium.  The pt notes that she quit smoking cigarettes when she was 27, started when she was about 20. The pt consumes alcohol rarely, but not since beginning narcotics. 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  Of note prior to the patient's visit today, pt has had an MRI Thoracic Spine completed on 11/27/18 with results revealing Multifocal marrow signal abnormality consistent with metastatic disease or multiple myeloma. The patient has multiple compression fractures most consistent with pathologic injuries. Extensive marrow signal abnormality makes determining age of the fractures difficult but edema is most intense in T3. Epidural tumor centrally and to the left posterior to  T3 extends into the left neural foramen and could impact the left T3 root.  Most recent lab results (12/01/18) of CBC w/diff is as follows: all values are WNL except for RBC at 3.17, HGB at 10.4, HCT at 33.5, MCV at 105.7. 11/28/18 CMP revealed all values WNL except for Glucose at 105, Total Protein at 10.2, Albumin at 3.1 11/30/18 24HR  UPEP revealed all values WNL except for M-spike at 75.1%. 11/28/18 Beta-2 microglobulin slightly elevated at 2.6 11/28/18 SPEP revealed M spike at 4.6g 11/28/18 Immunoglobulins revealed IgG at 6181, IgA at 24, IgM at 16, and IgE at 6.  On review of systems, pt reports constipation, back pain, stable energy levels, ankle swelling, tenderness at T3, lower back pain, and denies abdominal pains, neck pain, and any other symptoms.   On PMHx the pt reports redundant colon, single hemorrhoid, glaucoma, HTN, osteopenia, Tonsillectomy, right eye stent and multiple eye surgeries. On Social Hx the pt reports rare alcohol use, smoked cigarettes between ages 47 and 5. 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 On Family Hx the pt reports sister died from small cell lung cancer and was a lifelong smoker, brother's daughter died of breast cancer with BRCA1 and BRCA2 mutations. Cousin with female breast cancer.  INTERVAL HISTORY:   Megan Olson returns today for management and evaluation of her multiple myeloma. The patient's last visit with [place] was on 04/11/2021. The pt reports that she is doing well overall.  The pt reports no acute new symptoms.  She is here to start her second line treatment with carfilzomib cycle 1 day 1 today.  Notes no other acute new symptoms.  She is now off Ninlaro.  The pt has Bm Bx scheduled for 08/02 and PET for 08/04.  Lab results today 04/30/2021 of CBC w/diff and CMP reviewed with the patient no acute new changes.  Mild anemia with a hemoglobin of 11.7.  CMP unremarkable. Myeloma panel and kappa lambda free light chains pending.  We will continue to monitor with treatment. Has received her 2 boosters of COVID-19 and also Evusheld  On review of systems, pt reports no infection issues no fevers no chills no night sweats.    MEDICAL HISTORY:  Past Medical History:  Diagnosis Date   Anemia    Glaucoma    HYPERTENSION 03/11/2007   Multiple myeloma ([place])    OSTEOPENIA 03/11/2007    Pre-diabetes    Thyroid cancer ([place])    Thyroid disease    Tubular adenoma of colon 07/2015    SURGICAL HISTORY: Past Surgical History:  Procedure Laterality Date   BONE MARROW BIOPSY     multiple   BREAST EXCISIONAL BIOPSY Right 2000   BREAST LUMPECTOMY  1990   benign   CATARACT EXTRACTION Bilateral 2018   DILATION AND CURETTAGE OF UTERUS     bleeding at menopause. No uterine cancer   IR RADIOLOGIST EVAL & MGMT  12/13/2018   THYROIDECTOMY N/A 08/02/2020   Procedure: TOTAL THYROIDECTOMY;  Surgeon: [name], MD;  Location: [facility];  Service: General;  Laterality: N/A;   TONSILLECTOMY     age 41    SOCIAL HISTORY: Social History   Socioeconomic History   Marital status: Married    Spouse name: Not on file   Number of children: 1   Years of education: Not on file   Highest education level: Not on file  Occupational History   Occupation: Retired  Tobacco Use   Smoking status: Former    Packs/day: 0.50  Years: 7.00    Pack years: 3.50    Types: Cigarettes    Quit date: 01/04/1961    Years since quitting: 60.3   Smokeless tobacco: Never  Vaping Use   Vaping Use: Never used  Substance and Sexual Activity   Alcohol use: Not Currently    Alcohol/week: 1.0 standard drink    Types: 1 Standard drinks or equivalent per week    Comment: occas    Drug use: No   Sexual activity: Not Currently  Other Topics Concern   Not on file  Social History Narrative   Married. Lives with husband (patient of Dr. Yong Channel). 1 son. No grandkids. 1 granddog.       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, swing dancing, dinner, read      Social Determinants of Radio broadcast assistant Strain: Not on file  Food Insecurity: Not on file  Transportation Needs: Not on file  Physical Activity: Not on file  Stress: Not on file  Social Connections: Not on file  Intimate Partner Violence: Not on file     FAMILY HISTORY: Family History  Problem Relation Age of Onset   Heart disease Mother        CHF mother died 46   Arthritis Mother    Glaucoma Mother        sister as well   Alcohol abuse Father    Suicidality Father    Heart disease Sister        aortic valve replacement   Lung cancer Sister        smoker   Hyperlipidemia Brother    Hypertension Brother    COPD Brother    Colon polyps Brother    Arthritis Sister    Hypertension Sister    Glaucoma Sister    Hashimoto's thyroiditis Sister    Colon polyps Sister    Hypertension Son    Stroke Maternal Grandmother    Cystic fibrosis Niece    Colon cancer Neg Hx     ALLERGIES:  is allergic to ace inhibitors, benadryl [diphenhydramine], diamox [acetazolamide], sulfamethoxazole, lenalidomide, and penicillins.   MEDICATIONS:  Current Outpatient Medications  Medication Sig Dispense Refill   acyclovir (ZOVIRAX) 400 MG tablet Take 1 tablet (400 mg total) by mouth 2 (two) times daily. 60 tablet 3   ALPHAGAN P 0.1 % SOLN Place 1 drop into both eyes in the morning, at noon, and at bedtime.      amLODipine (NORVASC) 5 MG tablet Take 1 tablet (5 mg total) by mouth at bedtime. 90 tablet 3   aspirin EC 81 MG tablet Take 81 mg by mouth daily.     bimatoprost (LUMIGAN) 0.03 % ophthalmic solution Place 1 drop into both eyes at bedtime.      Cholecalciferol (VITAMIN D3) 125 MCG (5000 UT) TABS Take 5,000 Units by mouth daily.     Co-Enzyme Q-10 100 MG CAPS Take 100 mg by mouth daily.     dexamethasone (DECADRON) 4 MG tablet Take 5 tablets ($RemoveBe'20mg'MTUqKiHAL$ ) on day 22 of each cycle. Repeat every 28 days.  Take with breakfast. 12 tablet 3   dorzolamide-timolol (COSOPT) 22.3-6.8 MG/ML ophthalmic solution Place 1 drop into both eyes 2 (two) times daily.   11   fentaNYL (DURAGESIC) 12 MCG/HR Place 1 patch onto the skin every 3 (three) days. 10 patch 0   hydrOXYzine (ATARAX/VISTARIL) 25 MG tablet Take 1  tablet (25 mg total) by mouth 3 (three) times daily as  needed. 30 tablet 0   ixazomib citrate (NINLARO) 3 MG capsule TAKE 1 CAP (3 MG) BY MOUTH WEEKLY, 3 WEEKS ON, 1 WEEK OFF, REPEAT EVERY 4 WEEKS. TAKE ON AN EMPTY STOMACH 1HR BEFORE OR 2HR AFTER MEAL 3 capsule 2   levothyroxine (SYNTHROID) 75 MCG tablet Take 1 tablet (75 mcg total) by mouth daily before breakfast. 90 tablet 3   Multiple Vitamins-Minerals (MULTIVITAMIN WITH MINERALS) tablet Take 1 tablet by mouth daily. Centrum Silver 50 +     Omega-3 1000 MG CAPS Take 1,000 mg by mouth daily.      ondansetron (ZOFRAN) 8 MG tablet Take 8 mg by mouth 30 to 60 min prior to Cytoxan administration then take 8 mg twice daily as needed for nausea and vomiting. 30 tablet 1   oxyCODONE-acetaminophen (PERCOCET) 5-325 MG tablet Take 1-2 tablets by mouth every 4 (four) hours as needed for moderate pain or severe pain. 90 tablet 0   polyethylene glycol (MIRALAX) packet Take 17 g by mouth daily. 30 each 1   prochlorperazine (COMPAZINE) 10 MG tablet Take 1 tablet (10 mg total) by mouth every 6 (six) hours as needed (Nausea or vomiting). 30 tablet 1   senna-docusate (SENOKOT-S) 8.6-50 MG tablet Take 2 tablets by mouth at bedtime. 60 tablet 2   Simethicone (GAS-X PO) Take 1 tablet by mouth as needed.     vitamin C (ASCORBIC ACID) 500 MG tablet Take 500 mg by mouth 2 (two) times a week.      No current facility-administered medications for this visit.    REVIEW OF SYSTEMS:   10 Point review of Systems was done is negative except as noted above.  PHYSICAL EXAMINATION: ECOG PERFORMANCE STATUS: 1-2 Vital signs reviewed No acute distress GENERAL:alert, in no acute distress and comfortable SKIN: no acute rashes, no significant lesions EYES: conjunctiva are pink and non-injected, sclera anicteric OROPHARYNX: MMM, no exudates, no oropharyngeal erythema or ulceration NECK: supple, no JVD LYMPH:  no palpable lymphadenopathy in the cervical, axillary or inguinal regions LUNGS: clear to auscultation b/l with normal  respiratory effort HEART: regular rate & rhythm ABDOMEN:  normoactive bowel sounds , non tender, not distended. Extremity: no pedal edema PSYCH: alert & oriented x 3 with fluent speech NEURO: no focal motor/sensory deficits   LABORATORY DATA:  I have reviewed the data as listed  . CBC Latest Ref Rng & Units 03/25/2021 01/22/2021 11/27/2020  WBC 4.0 - 10.5 K/uL 5.7 4.4 5.4  Hemoglobin 12.0 - 15.0 g/dL 12.1 11.8(L) 12.3  Hematocrit 36.0 - 46.0 % 36.3 36.2 37.9  Platelets 150 - 400 K/uL 213 212 204    . CMP Latest Ref Rng & Units 03/25/2021 01/22/2021 11/27/2020  Glucose 70 - 99 mg/dL 109(H) 79 102(H)  BUN 8 - 23 mg/dL $Remove'18 14 15  'UsLHART$ Creatinine 0.44 - 1.00 mg/dL 0.67 0.72 0.77  Sodium 135 - 145 mmol/L 140 142 142  Potassium 3.5 - 5.1 mmol/L 3.8 3.9 4.2  Chloride 98 - 111 mmol/L 105 105 106  CO2 22 - 32 mmol/L $RemoveB'30 29 26  'fAqAzsqb$ Calcium 8.9 - 10.3 mg/dL 9.5 9.2 9.2  Total Protein 6.5 - 8.1 g/dL 7.1 7.0 7.1  Total Bilirubin 0.3 - 1.2 mg/dL 0.5 0.4 0.4  Alkaline Phos 38 - 126 U/L 37(L) 34(L) 33(L)  AST 15 - 41 U/L $Remo'21 18 18  'MMNDS$ ALT 0 - 44 U/L $Remo'18 14 16   'EuAGu$ 09/11/2019 Flow Pathology Report (  [id number]):   09/11/2019 Bone Marrow Report [id number]):    07/14/2019 Cytogenetics [phone]):    07/14/2019 Flow Pathology Report [id number]):   07/14/2019 BM Bx ([id number]):    12/01/18 BM Bx:   12/01/18 Flow Cytometry:       RADIOGRAPHIC STUDIES: I have personally reviewed the radiological images as listed and agreed with the findings in the report. No results found.   ASSESSMENT & PLAN:  80 y.o. female with  1. Multiple Myeloma with IgG Lambda specificity Labs upon initial presentation from 12/01/18 CBC w/diff revealed HGB at 10.4 with MCV of 105.7. 11/28/18 CMP revealed Creatinine normal at 0.68 and Calcium normal at 8.9. 11/28/18 Beta 2 microglobulin at 2.6mg  (also a reading at 4.7mg  on the same day). 11/30/18 24HR UPEP revealed M spike at 75.1%. 11/28/18 SPEP revealed M spike  of 4.6g. 11/28/18 Immunoglobulins revealed IgG elevated at 6181mg .  12/01/18 BM Bx revealed hypercellular bone marrow with 70-80% CD138 immunohistochemistry (44% aspirate) lambda-restricted plasma cells as well as a kappa restricted population of B-cells 11/27/18 MRI Thoracic Spine which revealed Multifocal marrow signal abnormality consistent with metastatic disease or multiple myeloma. The patient has multiple compression fractures most consistent with pathologic injuries. Extensive marrow signal abnormality makes determining age of the fractures difficult but edema is most intense in T3. 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 2. Areas of hypermetabolic disease identified in both lungs suggesting multiple myeloma involvement. 3. 1.9 cm calcified left thyroid nodule is hypermetabolic, but Indeterminate. 4. Colon is diffusely distended with gas and stool. Imaging features would be compatible with clinical constipation. 5.  Aortic Atherosclerois.  Pt describes grade II to III rash on her re-challenge from Revlimid with optimized pre-medications, and we discontinued Revlimid  Began Cytoxan with C4 Velcade and Dexamethasone  05/16/2019 M spike is down to 0.2g/dl showing continued improvement and a 90% reduction  -07/14/2019 BM Bx revealed BONE MARROW: - Hypercellular marrow with residual plasma cell neoplasm (<10%) PERIPHERAL BLOOD: - Pancytopenia".   07/17/2019 PET/CT Whole Body Scan ([id number]) revealed "1. 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 2. Hypermetabolic calcified left thyroid nodule, as  before, indeterminate. 3. Aortic atherosclerosis (ICD10-170.0). Coronary artery calcification."  09/11/2019 Bone Marrow Report ([id number]) revealed "BONE MARROW, ASPIRATE, CLOT, CORE:  -Variably cellular bone marrow with trilineage hematopoiesis and 2% plasma cells PERIPHERAL BLOOD:  -Pancytopenia".  09/11/2019 Flow Pathology Report [id number]) revealed "-Predominance of T lymphocytes with relative abundance of CD8 positive cells  -No significant B-cell population identified -Relative abundance of natural killer cells".  09/14/2019 PET/CT ([id number]) revealed "1. No findings of active malignancy. 2. Numerous thoracolumbar compression fractures many of which may be associated with osteoporosis. 3. Calcified 1.5 cm in diameter left thyroid nodule with persistent elevated activity. A significant minority of hypermetabolic thyroid nodules can be malignant, if not previously worked up to then thyroid ultrasound would be recommended as a next step. 4. There a few small lucent and small sclerotic lesions in the skeleton which are not hypermetabolic, possibilities include benign lesions or previously effectively treated myeloma. 5. Other imaging findings of potential clinical significance: Aortic Atherosclerosis (ICD10-I70.0). Coronary atherosclerosis. Prominent stool throughout the colon favors  constipation. Hyperdense right renal lesion is probably a complex cyst but technically nonspecific."  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 She does not currently have elevated lymphocyte counts on peripheral blood draws.   PLAN: -Discussed pt labwork today, 04/30/2021; CBC CMP stable and unremarkable. -PET/CT and bone marrow biopsy for restaging prior to second line carfilzomib treatment still pending. -Last myeloma panel  showed an M spike of 0.6 will track pending labs from today -Patient appropriate to start second line carfilzomib/dexamethasone treatment. -Continue acyclovir -Continue baby aspirin -Continue 12.5 mcg/hr Fentanyl patch & Oxycodone prn for pain management related to multiple myeloma. -Continue 5000 UT Vitamin D daily. Goal Vitamin D is 60 <>90 . -Continue Zometa q8weeks. FOLLOW UP: Plz schedule remaining C1 and all of C2 of Kyprolis treatment Labs with each treatment MD visit in 2 weeks with C1D15    The total time spent in the appointment was 30 minutes and more than 50% was on counseling and direct patient cares, ordering and management of carfilzomib chemotherapy.  All of the patient's questions were answered with apparent satisfaction. The patient knows to call the clinic with any problems, questions or concerns.    Sullivan Lone MD [place] AAHIVMS [facility] Hematology/Oncology Physician [facility]  (Office):       [phone] (Work cell):  [phone] (Fax):           [phone]  04/29/2021 8:45 PM  I, Reinaldo Raddle, am acting as scribe for Dr. Sullivan Lone, MD.     .I have reviewed the above documentation for accuracy and completeness, and I agree with the above. [name] MD

## 2021-04-30 ENCOUNTER — Inpatient Hospital Stay: Payer: Medicare HMO | Admitting: Hematology

## 2021-04-30 ENCOUNTER — Other Ambulatory Visit: Payer: Self-pay

## 2021-04-30 ENCOUNTER — Other Ambulatory Visit: Payer: Medicare HMO

## 2021-04-30 ENCOUNTER — Inpatient Hospital Stay: Payer: Medicare HMO

## 2021-04-30 VITALS — BP 149/76 | HR 62 | Temp 98.3°F | Resp 18 | Wt 112.0 lb

## 2021-04-30 DIAGNOSIS — Z5112 Encounter for antineoplastic immunotherapy: Secondary | ICD-10-CM | POA: Diagnosis not present

## 2021-04-30 DIAGNOSIS — Z87891 Personal history of nicotine dependence: Secondary | ICD-10-CM | POA: Diagnosis not present

## 2021-04-30 DIAGNOSIS — M8580 Other specified disorders of bone density and structure, unspecified site: Secondary | ICD-10-CM | POA: Diagnosis not present

## 2021-04-30 DIAGNOSIS — Z5111 Encounter for antineoplastic chemotherapy: Secondary | ICD-10-CM | POA: Diagnosis not present

## 2021-04-30 DIAGNOSIS — C9002 Multiple myeloma in relapse: Secondary | ICD-10-CM

## 2021-04-30 DIAGNOSIS — M545 Low back pain, unspecified: Secondary | ICD-10-CM | POA: Diagnosis not present

## 2021-04-30 DIAGNOSIS — I10 Essential (primary) hypertension: Secondary | ICD-10-CM | POA: Diagnosis not present

## 2021-04-30 DIAGNOSIS — Z7982 Long term (current) use of aspirin: Secondary | ICD-10-CM | POA: Diagnosis not present

## 2021-04-30 DIAGNOSIS — K5900 Constipation, unspecified: Secondary | ICD-10-CM | POA: Diagnosis not present

## 2021-04-30 DIAGNOSIS — Z801 Family history of malignant neoplasm of trachea, bronchus and lung: Secondary | ICD-10-CM | POA: Diagnosis not present

## 2021-04-30 DIAGNOSIS — Z7189 Other specified counseling: Secondary | ICD-10-CM

## 2021-04-30 DIAGNOSIS — C9000 Multiple myeloma not having achieved remission: Secondary | ICD-10-CM | POA: Diagnosis not present

## 2021-04-30 DIAGNOSIS — Z79899 Other long term (current) drug therapy: Secondary | ICD-10-CM | POA: Diagnosis not present

## 2021-04-30 LAB — CBC WITH DIFFERENTIAL (CANCER CENTER ONLY)
Abs Immature Granulocytes: 0.01 10*3/uL (ref 0.00–0.07)
Basophils Absolute: 0.1 10*3/uL (ref 0.0–0.1)
Basophils Relative: 1 %
Eosinophils Absolute: 0.1 10*3/uL (ref 0.0–0.5)
Eosinophils Relative: 1 %
HCT: 35.7 % — ABNORMAL LOW (ref 36.0–46.0)
Hemoglobin: 11.7 g/dL — ABNORMAL LOW (ref 12.0–15.0)
Immature Granulocytes: 0 %
Lymphocytes Relative: 9 %
Lymphs Abs: 0.4 10*3/uL — ABNORMAL LOW (ref 0.7–4.0)
MCH: 33.6 pg (ref 26.0–34.0)
MCHC: 32.8 g/dL (ref 30.0–36.0)
MCV: 102.6 fL — ABNORMAL HIGH (ref 80.0–100.0)
Monocytes Absolute: 0.6 10*3/uL (ref 0.1–1.0)
Monocytes Relative: 12 %
Neutro Abs: 3.8 10*3/uL (ref 1.7–7.7)
Neutrophils Relative %: 77 %
Platelet Count: 214 10*3/uL (ref 150–400)
RBC: 3.48 MIL/uL — ABNORMAL LOW (ref 3.87–5.11)
RDW: 14.3 % (ref 11.5–15.5)
WBC Count: 4.9 10*3/uL (ref 4.0–10.5)
nRBC: 0 % (ref 0.0–0.2)

## 2021-04-30 LAB — CMP (CANCER CENTER ONLY)
ALT: 12 U/L (ref 0–44)
AST: 15 U/L (ref 15–41)
Albumin: 3.9 g/dL (ref 3.5–5.0)
Alkaline Phosphatase: 33 U/L — ABNORMAL LOW (ref 38–126)
Anion gap: 8 (ref 5–15)
BUN: 16 mg/dL (ref 8–23)
CO2: 27 mmol/L (ref 22–32)
Calcium: 8.9 mg/dL (ref 8.9–10.3)
Chloride: 106 mmol/L (ref 98–111)
Creatinine: 0.75 mg/dL (ref 0.44–1.00)
GFR, Estimated: >60 mL/min (ref >60)
Glucose, Bld: 89 mg/dL (ref 70–99)
Potassium: 3.9 mmol/L (ref 3.5–5.1)
Sodium: 141 mmol/L (ref 135–145)
Total Bilirubin: 0.4 mg/dL (ref 0.3–1.2)
Total Protein: 6.8 g/dL (ref 6.5–8.1)

## 2021-04-30 MED ORDER — SODIUM CHLORIDE 0.9 % IV SOLN
Freq: Once | INTRAVENOUS | Status: AC
  Filled 2021-04-30: qty 250

## 2021-04-30 MED ORDER — SODIUM CHLORIDE 0.9 % IV SOLN
20.0000 mg | Freq: Once | INTRAVENOUS | Status: AC
  Administered 2021-04-30: 20 mg via INTRAVENOUS
  Filled 2021-04-30: qty 20

## 2021-04-30 MED ORDER — ONDANSETRON HCL 4 MG/2ML IJ SOLN
4.0000 mg | Freq: Once | INTRAMUSCULAR | Status: AC
  Administered 2021-04-30: 4 mg via INTRAVENOUS

## 2021-04-30 MED ORDER — DEXTROSE 5 % IV SOLN
20.0000 mg/m2 | Freq: Once | INTRAVENOUS | Status: AC
  Administered 2021-04-30: 30 mg via INTRAVENOUS
  Filled 2021-04-30: qty 15

## 2021-04-30 MED ORDER — ONDANSETRON HCL 4 MG/2ML IJ SOLN
INTRAMUSCULAR | Status: AC
  Filled 2021-04-30: qty 2

## 2021-04-30 MED ORDER — ACETAMINOPHEN 500 MG PO TABS
ORAL_TABLET | ORAL | Status: AC
  Filled 2021-04-30: qty 2

## 2021-04-30 MED ORDER — ACETAMINOPHEN 500 MG PO TABS
1000.0000 mg | ORAL_TABLET | Freq: Once | ORAL | Status: AC
  Administered 2021-04-30: 1000 mg via ORAL

## 2021-04-30 NOTE — Patient Instructions (Signed)
[facility]  Discharge Instructions: Thank you for choosing [facility] to provide your oncology and hematology care.   If you have a lab appointment with the Cancer Center, please go directly to the Cancer Center and check in at the registration area.   Wear comfortable clothing and clothing appropriate for easy access to any Portacath or PICC line.   We strive to give you quality time with your provider. You may need to reschedule your appointment if you arrive late (15 or more minutes).  Arriving late affects you and other patients whose appointments are after yours.  Also, if you miss three or more appointments without notifying the office, you may be dismissed from the clinic at the provider's discretion.      For prescription refill requests, have your pharmacy contact our office and allow 72 hours for refills to be completed.    Today you received the following chemotherapy and/or immunotherapy agents Kyprolis      To help prevent nausea and vomiting after your treatment, we encourage you to take your nausea medication as directed.  BELOW ARE SYMPTOMS THAT SHOULD BE REPORTED IMMEDIATELY: *FEVER GREATER THAN 100.4 F (38 C) OR HIGHER *CHILLS OR SWEATING *NAUSEA AND VOMITING THAT IS NOT CONTROLLED WITH YOUR NAUSEA MEDICATION *UNUSUAL SHORTNESS OF BREATH *UNUSUAL BRUISING OR BLEEDING *URINARY PROBLEMS (pain or burning when urinating, or frequent urination) *BOWEL PROBLEMS (unusual diarrhea, constipation, pain near the anus) TENDERNESS IN MOUTH AND THROAT WITH OR WITHOUT PRESENCE OF ULCERS (sore throat, sores in mouth, or a toothache) UNUSUAL RASH, SWELLING OR PAIN  UNUSUAL VAGINAL DISCHARGE OR ITCHING   Items with * indicate a potential emergency and should be followed up as soon as possible or go to the Emergency Department if any problems should occur.  Please show the CHEMOTHERAPY ALERT CARD or IMMUNOTHERAPY ALERT CARD at check-in to  the Emergency Department and triage nurse.  Should you have questions after your visit or need to cancel or reschedule your appointment, please contact [facility]  Dept: [phone]  and follow the prompts.  Office hours are 8:00 a.m. to 4:30 p.m. Monday - Friday. Please note that voicemails left after 4:00 p.m. may not be returned until the following business day.  We are closed weekends and major holidays. You have access to a nurse at all times for urgent questions. Please call the main number to the clinic Dept: [phone] and follow the prompts.   For any non-urgent questions, you may also contact your provider using MyChart. We now offer e-Visits for anyone 18 and older to request care online for non-urgent symptoms. For details visit [email or web address].   Also download the MyChart app! Go to the app store, search "MyChart", open the app, select [facility], and log in with your MyChart username and password.  Due to Covid, a mask is required upon entering the hospital/clinic. If you do not have a mask, one will be given to you upon arrival. For doctor visits, patients may have 1 support person aged 18 or older with them. For treatment visits, patients cannot have anyone with them due to current Covid guidelines and our immunocompromised population.   Carfilzomib injection What is this medication? CARFILZOMIB (kar FILZ oh mib) targets a specific protein within cancer cellsand stops the cancer cells from growing. It is used to treat multiple myeloma. This medicine may be used for other purposes; ask your health care provider orpharmacist if you have questions.   COMMON BRAND NAME(S): KYPROLIS What should I tell my care team before I take this medication? They need to know if you have any of these conditions: heart disease history of blood clots irregular heartbeat kidney disease liver disease lung or breathing disease an unusual or allergic reaction  to carfilzomib, or other medicines, foods, dyes, or preservatives pregnant or trying to get pregnant breast-feeding How should I use this medication? This medicine is for injection or infusion into a vein. It is given by a healthcare professional in a hospital or clinic setting. Talk to your pediatrician regarding the use of this medicine in children.Special care may be needed. Overdosage: If you think you have taken too much of this medicine contact apoison control center or emergency room at once. NOTE: This medicine is only for you. Do not share this medicine with others. What if I miss a dose? It is important not to miss your dose. Call your doctor or health careprofessional if you are unable to keep an appointment. What may interact with this medication? Interactions are not expected. This list may not describe all possible interactions. Give your health care provider a list of all the medicines, herbs, non-prescription drugs, or dietary supplements you use. Also tell them if you smoke, drink alcohol, or use illegaldrugs. Some items may interact with your medicine. What should I watch for while using this medication? Your condition will be monitored while you are receiving this medicine. You may need blood work done while you are taking this medicine. Do not become pregnant while taking this medicine or for 6 months after stopping it. Women should inform their health care provider if they wish to become pregnant or think they might be pregnant. Men should not father a child while taking this medicine and for 3 months after stopping it. There is a potential for serious side effects to an unborn child. Talk to your health care provider for more information. Do not breast-feed an infant while taking thismedicine or for 2 weeks after stopping it. Check with your health care provider if you have severe diarrhea, nausea, and vomiting, or if you sweat a lot. The loss of too much body fluid may make  itdangerous for you to take this medicine. You may get drowsy or dizzy. Do not drive, use machinery, or do anything that needs mental alertness until you know how this medicine affects you. Do not stand up or sit up quickly, especially if you are an older patient. Thisreduces the risk of dizzy or fainting spells. What side effects may I notice from receiving this medication? Side effects that you should report to your doctor or health care professionalas soon as possible: allergic reactions like skin rash, itching or hives, swelling of the face, lips, or tongue confusion dizziness feeling faint or lightheaded fever or chills palpitations seizures signs and symptoms of bleeding such as bloody or black, tarry stools; red or dark-brown urine; spitting up blood or brown material that looks like coffee grounds; red spots on the skin; unusual bruising or bleeding including from the eye, gums, or nose signs and symptoms of a blood clot such as breathing problems; changes in vision; chest pain; severe, sudden headache; pain, swelling, warmth in the leg; trouble speaking; sudden numbness or weakness of the face, arm or leg signs and symptoms of kidney injury like trouble passing urine or change in the amount of urine signs and symptoms of liver injury like dark yellow or brown urine; general ill feeling or flu-like symptoms;   light-colored stools; loss of appetite; nausea; right upper belly pain; unusually weak or tired; yellowing of the eyes or skin Side effects that usually do not require medical attention (report to yourdoctor or health care professional if they continue or are bothersome): back pain cough diarrhea headache muscle cramps trouble sleeping vomiting This list may not describe all possible side effects. Call your doctor for medical advice about side effects. You may report side effects to FDA at1-800-FDA-1088. Where should I keep my medication? This drug is given in a hospital or  clinic and will not be stored at home. NOTE: This sheet is a summary. It may not cover all possible information. If you have questions about this medicine, talk to your doctor, pharmacist, orhealth care provider.  2022 Elsevier/Gold Standard (2020-10-11 15:24:55)   

## 2021-05-01 ENCOUNTER — Telehealth: Payer: Self-pay | Admitting: *Deleted

## 2021-05-01 ENCOUNTER — Telehealth: Payer: Self-pay | Admitting: Hematology

## 2021-05-01 LAB — MULTIPLE MYELOMA PANEL, SERUM
Albumin SerPl Elph-Mcnc: 4.1 g/dL (ref 2.9–4.4)
Albumin/Glob SerPl: 1.7 (ref 0.7–1.7)
Alpha 1: 0.2 g/dL (ref 0.0–0.4)
Alpha2 Glob SerPl Elph-Mcnc: 0.7 g/dL (ref 0.4–1.0)
B-Globulin SerPl Elph-Mcnc: 1.2 g/dL (ref 0.7–1.3)
Gamma Glob SerPl Elph-Mcnc: 0.4 g/dL (ref 0.4–1.8)
Globulin, Total: 2.5 g/dL (ref 2.2–3.9)
IgA: 32 mg/dL — ABNORMAL LOW (ref 64–422)
IgG (Immunoglobin G), Serum: 889 mg/dL (ref 586–1602)
IgM (Immunoglobulin M), Srm: 32 mg/dL (ref 26–217)
M Protein SerPl Elph-Mcnc: 0.3 g/dL — ABNORMAL HIGH
Total Protein ELP: 6.6 g/dL (ref 6.0–8.5)

## 2021-05-01 LAB — KAPPA/LAMBDA LIGHT CHAINS
Kappa free light chain: 11.8 mg/L (ref 3.3–19.4)
Kappa, lambda light chain ratio: 0.27 (ref 0.26–1.65)
Lambda free light chains: 44.2 mg/L — ABNORMAL HIGH (ref 5.7–26.3)

## 2021-05-01 NOTE — Telephone Encounter (Signed)
Scheduled follow-up appointments per 7/27 los. Patient is aware. 

## 2021-05-02 ENCOUNTER — Other Ambulatory Visit: Payer: Self-pay | Admitting: Hematology

## 2021-05-02 DIAGNOSIS — C9002 Multiple myeloma in relapse: Secondary | ICD-10-CM

## 2021-05-02 NOTE — Progress Notes (Signed)
Port a cath placement orders per patient preference.

## 2021-05-05 ENCOUNTER — Other Ambulatory Visit: Payer: Self-pay | Admitting: Radiology

## 2021-05-06 ENCOUNTER — Ambulatory Visit (HOSPITAL_COMMUNITY)
Admission: RE | Admit: 2021-05-06 | Discharge: 2021-05-06 | Disposition: A | Discharge status: Home / Self Care | Payer: Medicare HMO | Source: Ambulatory Visit | Attending: Hematology | Admitting: Hematology

## 2021-05-06 ENCOUNTER — Encounter (HOSPITAL_COMMUNITY): Payer: Self-pay

## 2021-05-06 ENCOUNTER — Other Ambulatory Visit: Payer: Self-pay

## 2021-05-06 ENCOUNTER — Encounter: Payer: Self-pay | Admitting: Hematology

## 2021-05-06 DIAGNOSIS — C9002 Multiple myeloma in relapse: Secondary | ICD-10-CM | POA: Diagnosis not present

## 2021-05-06 DIAGNOSIS — D72810 Lymphocytopenia: Secondary | ICD-10-CM | POA: Diagnosis not present

## 2021-05-06 DIAGNOSIS — D72819 Decreased white blood cell count, unspecified: Secondary | ICD-10-CM | POA: Diagnosis not present

## 2021-05-06 DIAGNOSIS — M8580 Other specified disorders of bone density and structure, unspecified site: Secondary | ICD-10-CM | POA: Diagnosis not present

## 2021-05-06 DIAGNOSIS — M549 Dorsalgia, unspecified: Secondary | ICD-10-CM | POA: Insufficient documentation

## 2021-05-06 DIAGNOSIS — C9000 Multiple myeloma not having achieved remission: Secondary | ICD-10-CM | POA: Diagnosis not present

## 2021-05-06 LAB — CBC WITH DIFFERENTIAL/PLATELET
Abs Immature Granulocytes: 0.03 10*3/uL (ref 0.00–0.07)
Basophils Absolute: 0 10*3/uL (ref 0.0–0.1)
Basophils Relative: 1 %
Eosinophils Absolute: 0.1 10*3/uL (ref 0.0–0.5)
Eosinophils Relative: 2 %
HCT: 36.9 % (ref 36.0–46.0)
Hemoglobin: 12 g/dL (ref 12.0–15.0)
Immature Granulocytes: 1 %
Lymphocytes Relative: 13 %
Lymphs Abs: 0.5 10*3/uL — ABNORMAL LOW (ref 0.7–4.0)
MCH: 33.8 pg (ref 26.0–34.0)
MCHC: 32.5 g/dL (ref 30.0–36.0)
MCV: 103.9 fL — ABNORMAL HIGH (ref 80.0–100.0)
Monocytes Absolute: 0.5 10*3/uL (ref 0.1–1.0)
Monocytes Relative: 13 %
Neutro Abs: 2.6 10*3/uL (ref 1.7–7.7)
Neutrophils Relative %: 70 %
Platelets: 174 10*3/uL (ref 150–400)
RBC: 3.55 MIL/uL — ABNORMAL LOW (ref 3.87–5.11)
RDW: 14.2 % (ref 11.5–15.5)
WBC: 3.7 10*3/uL — ABNORMAL LOW (ref 4.0–10.5)
nRBC: 0 % (ref 0.0–0.2)

## 2021-05-06 LAB — GLUCOSE, CAPILLARY: Glucose-Capillary: 120 mg/dL — ABNORMAL HIGH (ref 70–99)

## 2021-05-06 MED ORDER — LIDOCAINE HCL (PF) 1 % IJ SOLN
INTRAMUSCULAR | Status: AC | PRN
  Administered 2021-05-06: 15 mL

## 2021-05-06 MED ORDER — MIDAZOLAM HCL 2 MG/2ML IJ SOLN
INTRAMUSCULAR | Status: AC
  Filled 2021-05-06: qty 2

## 2021-05-06 MED ORDER — FENTANYL CITRATE (PF) 100 MCG/2ML IJ SOLN
INTRAMUSCULAR | Status: AC
  Filled 2021-05-06: qty 2

## 2021-05-06 MED ORDER — SODIUM CHLORIDE 0.9 % IV SOLN: INTRAVENOUS | Status: DC

## 2021-05-06 MED ORDER — MIDAZOLAM HCL 2 MG/2ML IJ SOLN
INTRAMUSCULAR | Status: AC | PRN
  Administered 2021-05-06: 1 mg via INTRAVENOUS
  Administered 2021-05-06 (×2): 0.5 mg via INTRAVENOUS

## 2021-05-06 MED ORDER — FENTANYL CITRATE (PF) 100 MCG/2ML IJ SOLN
INTRAMUSCULAR | Status: AC | PRN
  Administered 2021-05-06 (×2): 25 ug via INTRAVENOUS
  Administered 2021-05-06: 50 ug via INTRAVENOUS

## 2021-05-06 NOTE — H&P (Signed)
Referring Physician(s): [name]  Supervising Physician: Markus Daft  Patient Status:  [facility] OP  Chief Complaint: "I'm here for a bone marrow biopsy"   Subjective: Patient familiar to IR service from bone marrow biopsies x3 in 2020.  She has a history of multiple myeloma and is currently undergoing chemotherapy.  She presents again today for CT-guided bone marrow biopsy to assess treatment response.  She currently denies fever, headache, chest pain, dyspnea, cough, abdominal pain, nausea, vomiting or bleeding.  She does have intermittent back pain.  Past Medical History:  Diagnosis Date   Anemia    Glaucoma    HYPERTENSION 03/11/2007   Multiple myeloma ([place])    OSTEOPENIA 03/11/2007   Pre-diabetes    Thyroid cancer ([place])    Thyroid disease    Tubular adenoma of colon 07/2015   Past Surgical History:  Procedure Laterality Date   BONE MARROW BIOPSY     multiple   BREAST EXCISIONAL BIOPSY Right 2000   BREAST LUMPECTOMY  1990   benign   CATARACT EXTRACTION Bilateral 2018   DILATION AND CURETTAGE OF UTERUS     bleeding at menopause. No uterine cancer   IR RADIOLOGIST EVAL & MGMT  12/13/2018   THYROIDECTOMY N/A 08/02/2020   Procedure: TOTAL THYROIDECTOMY;  Surgeon: [name], MD;  Location: [facility] ORS;  Service: General;  Laterality: N/A;   TONSILLECTOMY     age 73       Allergies: Ace inhibitors, Benadryl [diphenhydramine], Diamox [acetazolamide], Sulfamethoxazole, Lenalidomide, and Penicillins  Medications: Prior to Admission medications   Medication Sig Start Date End Date Taking? Authorizing Provider  acyclovir (ZOVIRAX) 400 MG tablet Take 1 tablet (400 mg total) by mouth 2 (two) times daily. 02/28/21  Yes Kale, Cloria Spring, MD  ALPHAGAN P 0.1 % SOLN Place 1 drop into both eyes in the morning, at noon, and at bedtime.  02/02/17  Yes [provider]  amLODipine (NORVASC) 5 MG tablet Take 1 tablet (5 mg total) by mouth at bedtime. 01/27/21  Yes [name], MD  bimatoprost (LUMIGAN) 0.03 % ophthalmic solution Place 1 drop into both eyes at bedtime.    Yes [provider]  Cholecalciferol (VITAMIN D3) 125 MCG (5000 UT) TABS Take 5,000 Units by mouth daily.   Yes [provider]  Co-Enzyme Q-10 100 MG CAPS Take 100 mg by mouth daily.   Yes [provider]  dorzolamide-timolol (COSOPT) 22.3-6.8 MG/ML ophthalmic solution Place 1 drop into both eyes 2 (two) times daily.  03/01/17  Yes [provider]  levothyroxine (SYNTHROID) 75 MCG tablet Take 1 tablet (75 mcg total) by mouth daily before breakfast. 01/20/21  Yes [name], MD  magnesium chloride (SLOW-MAG) 64 MG TBEC SR tablet Take by mouth.   Yes [provider]  Multiple Vitamins-Minerals (MULTIVITAMIN WITH MINERALS) tablet Take 1 tablet by mouth daily. Centrum Silver 50 +   Yes [provider]  oxyCODONE-acetaminophen (PERCOCET) 5-325 MG tablet Take 1-2 tablets by mouth every 4 (four) hours as needed for moderate pain or severe pain. 03/27/21  Yes Orson Slick, MD  polyethylene glycol [facility]) packet Take 17 g by mouth daily. 12/12/18  Yes [name], MD  Simethicone (GAS-X PO) Take 1 tablet by mouth as needed.   Yes [provider]  vitamin C (ASCORBIC ACID) 500 MG tablet Take 500 mg by mouth 2 (two) times a week.  02/02/19  Yes [provider]  aspirin EC 81 MG tablet  Take 81 mg by mouth daily.    [provider]  dexamethasone (DECADRON) 4 MG tablet Take 5 tablets (11m) on day 22 of each cycle. Repeat every 28 days.  Take with breakfast. 04/16/21   KBrunetta Genera MD  fentaNYL (DURAGESIC) 12 MCG/HR Place 1 patch onto the skin every 3 (three) days. 04/28/21   KBrunetta Genera MD  hydrOXYzine (ATARAX/VISTARIL) 25 MG tablet Take 1 tablet (25 mg total) by mouth 3 (three) times daily as needed. 01/10/19   KBrunetta Genera MD  ixazomib citrate (NINLARO) 3 MG capsule TAKE 1 CAP (3 MG)  BY MOUTH WEEKLY, 3 WEEKS ON, 1 WEEK OFF, REPEAT EVERY 4 WEEKS. TAKE ON AN EMPTY STOMACH 1HR BEFORE OR 2HR AFTER MEAL 02/27/21 02/25/22  KBrunetta Genera MD  Omega-3 1000 MG CAPS Take 1,000 mg by mouth daily.     [provider]  ondansetron (ZOFRAN) 8 MG tablet Take 8 mg by mouth 30 to 60 min prior to Cytoxan administration then take 8 mg twice daily as needed for nausea and vomiting. 04/16/21   KBrunetta Genera MD  prochlorperazine (COMPAZINE) 10 MG tablet Take 1 tablet (10 mg total) by mouth every 6 (six) hours as needed (Nausea or vomiting). 04/16/21   KBrunetta Genera MD  senna-docusate (SENOKOT-S) 8.6-50 MG tablet Take 2 tablets by mouth at bedtime. 12/12/18   KBrunetta Genera MD     Vital Signs: BP (!) 192/94   Pulse (!) 58   Temp 98.8 F (37.1 C) (Oral)   Resp 16   Ht _0  (1.549 m)   SpO2 98%   BMI 21.16 kg/m   Physical Exam awake, alert.  Chest clear to auscultation bilaterally.  Heart with regular rate and rhythm.  Abdomen soft, slightly protuberant, positive bowel sounds, nontender.  No lower extremity edema.  Imaging: No results found.  Labs:  CBC: Recent Labs    01/22/21 0844 03/25/21 0906 04/30/21 0759 05/06/21 0730  WBC 4.4 5.7 4.9 3.7*  HGB 11.8* 12.1 11.7* 12.0  HCT 36.2 36.3 35.7* 36.9  PLT 212 213 214 174    COAGS: No results for input(s): INR, APTT in the last 8760 hours.  BMP: Recent Labs    06/13/20 1112 08/01/20 0929 11/27/20 1006 01/22/21 0844 03/25/21 0906 04/30/21 0759  NA 141   < > 142 142 140 141  K 4.3   < > 4.2 3.9 3.8 3.9  CL 106   < > 106 105 105 106  CO2 29   < > _1 GLUCOSE 105*   < > 102* 79 109* 89  BUN 15   < > _2 CALCIUM 9.6   < > 9.2 9.2 9.5 8.9  CREATININE 0.73   < > 0.77 0.72 0.67 0.75  GFRNONAA >60   < > >60 >60 >60 >60  GFRAA >60  --   --   --   --   --    < > = values in this interval not displayed.    LIVER FUNCTION TESTS: Recent Labs    11/27/20 1006  01/22/21 0844 03/25/21 0906 04/30/21 0759  BILITOT 0.4 0.4 0.5 0.4  AST _3 ALT _4 ALKPHOS 33* 34* 37* 33*  PROT 7.1 7.0 7.1 6.8  ALBUMIN 4.3 4.2 4.6 3.9    Assessment and Plan: Patient familiar to IR service from bone marrow biopsies x3 in 2020.  She has a history of multiple myeloma and is currently undergoing chemotherapy.  She presents again today for CT-guided bone marrow biopsy to assess treatment response. Risks and benefits of procedure  was discussed with the patient  including, but not limited to bleeding, infection, damage to adjacent structures or low yield requiring additional tests.  All of the questions were answered and there is agreement to proceed.  Consent signed and in chart.    Electronically Signed: D. Rowe Robert, PA-C 05/06/2021, 8:23 AM   I spent a total of 20 minutes at the the patient's bedside AND on the patient's hospital floor or unit, greater than 50% of which was counseling/coordinating care for CT-guided bone marrow biopsy

## 2021-05-06 NOTE — Procedures (Signed)
Interventional Radiology Procedure: ° ° °Indications: Myeloma ° °Procedure: CT guided bone marrow biopsy ° °Findings: 2 aspirates and 1 core from right ilium ° °Complications: None °    °EBL: Minimal, less than 10 ml ° °Plan: Discharge to home in one hour. ° ° °Raynee Mccasland R. Shanzay Hepworth, MD  °Pager: [phone] ° ° °

## 2021-05-06 NOTE — Discharge Instructions (Signed)
Interventional radiology phone numbers ?[phone] ?After hours [phone] ? ?Bone Marrow Aspiration and Bone Marrow Biopsy, Adult, Care After ?This sheet gives you information about how to care for yourself after your procedure. Your health care provider may also give you more specific instructions. If you have problems or questions, contact your health care provider. ?What can I expect after the procedure? ?After the procedure, it is common to have: ?Mild pain and tenderness. ?Swelling. ?Bruising. ?Follow these instructions at home: ?Puncture site care ?Follow instructions from your health care provider about how to take care of the puncture site. Make sure you: ?Wash your hands with soap and water before and after you change your bandage (dressing). If soap and water are not available, use hand sanitizer. ? ?Check your puncture site every day for signs of infection. Check for: ?More redness, swelling, or pain. ?Fluid or blood. ?Warmth. ?Pus or a bad smell.   ?Activity ?Return to your normal activities as told by your health care provider. Ask your health care provider what activities are safe for you. ?Do not lift anything that is heavier than 10 lb (4.5 kg), or the limit that you are told, until your health care provider says that it is safe. ?Do not drive for 24 hours if you were given a sedative during your procedure. ?General instructions ?Take over-the-counter and prescription medicines only as told by your health care provider. ?Do not take baths, swim, or use a hot tub until your health care provider approves. You may remove your dressing tomorrow and shower 24 hours after your biopsy. ?Put ice on the affected area. To do this: ?Put ice in a plastic bag. ?Place a towel between your skin and the bag. ?Leave the ice on for 20 minutes, 2-3 times a day. ?Keep all follow-up visits as told by your health care provider. This is important.   ?Contact a health care provider if: ?Your pain is not controlled with  medicine. ?You have a fever. ?You have more redness, swelling, or pain around the puncture site. ?You have fluid or blood coming from the puncture site. ?Your puncture site feels warm to the touch. ?You have pus or a bad smell coming from the puncture site. ?Summary ?After the procedure, it is common to have mild pain, tenderness, swelling, and bruising. ?Follow instructions from your health care provider about how to take care of the puncture site and what activities are safe for you. ?Take over-the-counter and prescription medicines only as told by your health care provider. ?Contact a health care provider if you have any signs of infection, such as fluid or blood coming from the puncture site. ?This information is not intended to replace advice given to you by your health care provider. Make sure you discuss any questions you have with your health care provider. ?Document Revised: 02/07/2019 Document Reviewed: 02/07/2019 ?Elsevier Patient Education ? 2021 Elsevier Inc. ? ? ? ?Moderate Conscious Sedation, Adult, Care After ?This sheet gives you information about how to care for yourself after your procedure. Your health care provider may also give you more specific instructions. If you have problems or questions, contact your health care provider. ?What can I expect after the procedure? ?After the procedure, it is common to have: ?Sleepiness for several hours. ?Impaired judgment for several hours. ?Difficulty with balance. ?Vomiting if you eat too soon. ?Follow these instructions at home: ?For the time period you were told by your health care provider: ?Rest. ?Do not participate in activities where you could fall or   become injured. ?Do not drive or use machinery. ?Do not drink alcohol. ?Do not take sleeping pills or medicines that cause drowsiness. ?Do not make important decisions or sign legal documents. ?Do not take care of children on your own.  ?  ?  ?Eating and drinking ?Follow the diet recommended by your  health care provider. ?Drink enough fluid to keep your urine pale yellow. ?If you vomit: ?Drink water, juice, or soup when you can drink without vomiting. ?Make sure you have little or no nausea before eating solid foods.   ?General instructions ?Take over-the-counter and prescription medicines only as told by your health care provider. ?Have a responsible adult stay with you for the time you are told. It is important to have someone help care for you until you are awake and alert. ?Do not smoke. ?Keep all follow-up visits as told by your health care provider. This is important. ?Contact a health care provider if: ?You are still sleepy or having trouble with balance after 24 hours. ?You feel light-headed. ?You keep feeling nauseous or you keep vomiting. ?You develop a rash. ?You have a fever. ?You have redness or swelling around the IV site. ?Get help right away if: ?You have trouble breathing. ?You have new-onset confusion at home. ?Summary ?After the procedure, it is common to feel sleepy, have impaired judgment, or feel nauseous if you eat too soon. ?Rest after you get home. Know the things you should not do after the procedure. ?Follow the diet recommended by your health care provider and drink enough fluid to keep your urine pale yellow. ?Get help right away if you have trouble breathing or new-onset confusion at home. ?This information is not intended to replace advice given to you by your health care provider. Make sure you discuss any questions you have with your health care provider. ?Document Revised: 01/19/2020 Document Reviewed: 08/17/2019 ?Elsevier Patient Education ? 2021 Elsevier Inc.  ?

## 2021-05-08 ENCOUNTER — Other Ambulatory Visit: Payer: Self-pay

## 2021-05-08 ENCOUNTER — Inpatient Hospital Stay: Payer: Medicare HMO | Attending: Hematology

## 2021-05-08 ENCOUNTER — Ambulatory Visit: Payer: Medicare HMO | Admitting: Hematology

## 2021-05-08 ENCOUNTER — Other Ambulatory Visit: Payer: Medicare HMO

## 2021-05-08 ENCOUNTER — Inpatient Hospital Stay: Payer: Medicare HMO

## 2021-05-08 ENCOUNTER — Encounter (HOSPITAL_COMMUNITY)
Admission: RE | Admit: 2021-05-08 | Discharge: 2021-05-08 | Disposition: A | Discharge status: Home / Self Care | Payer: Medicare HMO | Source: Ambulatory Visit | Attending: Hematology | Admitting: Hematology

## 2021-05-08 ENCOUNTER — Other Ambulatory Visit: Payer: Self-pay | Admitting: Hematology

## 2021-05-08 VITALS — BP 177/78 | HR 57 | Temp 98.1°F | Resp 18 | Wt 110.5 lb

## 2021-05-08 DIAGNOSIS — C9002 Multiple myeloma in relapse: Secondary | ICD-10-CM

## 2021-05-08 DIAGNOSIS — M25551 Pain in right hip: Secondary | ICD-10-CM | POA: Insufficient documentation

## 2021-05-08 DIAGNOSIS — Z7189 Other specified counseling: Secondary | ICD-10-CM

## 2021-05-08 DIAGNOSIS — Z5111 Encounter for antineoplastic chemotherapy: Secondary | ICD-10-CM | POA: Diagnosis not present

## 2021-05-08 DIAGNOSIS — Z7982 Long term (current) use of aspirin: Secondary | ICD-10-CM | POA: Diagnosis not present

## 2021-05-08 DIAGNOSIS — Z85850 Personal history of malignant neoplasm of thyroid: Secondary | ICD-10-CM | POA: Diagnosis not present

## 2021-05-08 DIAGNOSIS — M8580 Other specified disorders of bone density and structure, unspecified site: Secondary | ICD-10-CM | POA: Insufficient documentation

## 2021-05-08 DIAGNOSIS — M899 Disorder of bone, unspecified: Secondary | ICD-10-CM | POA: Insufficient documentation

## 2021-05-08 DIAGNOSIS — I10 Essential (primary) hypertension: Secondary | ICD-10-CM | POA: Diagnosis not present

## 2021-05-08 DIAGNOSIS — D649 Anemia, unspecified: Secondary | ICD-10-CM | POA: Diagnosis not present

## 2021-05-08 DIAGNOSIS — C9000 Multiple myeloma not having achieved remission: Secondary | ICD-10-CM | POA: Diagnosis not present

## 2021-05-08 DIAGNOSIS — Z79899 Other long term (current) drug therapy: Secondary | ICD-10-CM | POA: Insufficient documentation

## 2021-05-08 LAB — CMP (CANCER CENTER ONLY)
ALT: 13 U/L (ref 0–44)
AST: 13 U/L — ABNORMAL LOW (ref 15–41)
Albumin: 3.8 g/dL (ref 3.5–5.0)
Alkaline Phosphatase: 33 U/L — ABNORMAL LOW (ref 38–126)
Anion gap: 8 (ref 5–15)
BUN: 14 mg/dL (ref 8–23)
CO2: 25 mmol/L (ref 22–32)
Calcium: 9 mg/dL (ref 8.9–10.3)
Chloride: 107 mmol/L (ref 98–111)
Creatinine: 0.77 mg/dL (ref 0.44–1.00)
GFR, Estimated: >60 mL/min (ref >60)
Glucose, Bld: 137 mg/dL — ABNORMAL HIGH (ref 70–99)
Potassium: 4.3 mmol/L (ref 3.5–5.1)
Sodium: 140 mmol/L (ref 135–145)
Total Bilirubin: 0.4 mg/dL (ref 0.3–1.2)
Total Protein: 6.5 g/dL (ref 6.5–8.1)

## 2021-05-08 LAB — CBC WITH DIFFERENTIAL (CANCER CENTER ONLY)
Abs Immature Granulocytes: 0.03 10*3/uL (ref 0.00–0.07)
Basophils Absolute: 0 10*3/uL (ref 0.0–0.1)
Basophils Relative: 1 %
Eosinophils Absolute: 0.1 10*3/uL (ref 0.0–0.5)
Eosinophils Relative: 1 %
HCT: 35.9 % — ABNORMAL LOW (ref 36.0–46.0)
Hemoglobin: 11.9 g/dL — ABNORMAL LOW (ref 12.0–15.0)
Immature Granulocytes: 1 %
Lymphocytes Relative: 13 %
Lymphs Abs: 0.5 10*3/uL — ABNORMAL LOW (ref 0.7–4.0)
MCH: 33.6 pg (ref 26.0–34.0)
MCHC: 33.1 g/dL (ref 30.0–36.0)
MCV: 101.4 fL — ABNORMAL HIGH (ref 80.0–100.0)
Monocytes Absolute: 0.5 10*3/uL (ref 0.1–1.0)
Monocytes Relative: 13 %
Neutro Abs: 3 10*3/uL (ref 1.7–7.7)
Neutrophils Relative %: 71 %
Platelet Count: 198 10*3/uL (ref 150–400)
RBC: 3.54 MIL/uL — ABNORMAL LOW (ref 3.87–5.11)
RDW: 14.3 % (ref 11.5–15.5)
WBC Count: 4.2 10*3/uL (ref 4.0–10.5)
nRBC: 0 % (ref 0.0–0.2)

## 2021-05-08 LAB — GLUCOSE, CAPILLARY: Glucose-Capillary: 124 mg/dL — ABNORMAL HIGH (ref 70–99)

## 2021-05-08 LAB — SURGICAL PATHOLOGY

## 2021-05-08 MED ORDER — DEXTROSE 5 % IV SOLN
27.0000 mg/m2 | Freq: Once | INTRAVENOUS | Status: AC
  Administered 2021-05-08: 40 mg via INTRAVENOUS
  Filled 2021-05-08: qty 15

## 2021-05-08 MED ORDER — SODIUM CHLORIDE 0.9 % IV SOLN
10.0000 mg | Freq: Once | INTRAVENOUS | Status: AC
  Administered 2021-05-08: 10 mg via INTRAVENOUS
  Filled 2021-05-08: qty 10

## 2021-05-08 MED ORDER — ONDANSETRON HCL 4 MG/2ML IJ SOLN
4.0000 mg | Freq: Once | INTRAMUSCULAR | Status: AC
  Administered 2021-05-08: 4 mg via INTRAVENOUS

## 2021-05-08 MED ORDER — ACETAMINOPHEN 500 MG PO TABS
ORAL_TABLET | ORAL | Status: AC
  Filled 2021-05-08: qty 2

## 2021-05-08 MED ORDER — SODIUM CHLORIDE 0.9 % IV SOLN
Freq: Once | INTRAVENOUS | Status: AC
  Filled 2021-05-08: qty 250

## 2021-05-08 MED ORDER — ACETAMINOPHEN 500 MG PO TABS
1000.0000 mg | ORAL_TABLET | Freq: Once | ORAL | Status: AC
  Administered 2021-05-08: 1000 mg via ORAL

## 2021-05-08 MED ORDER — FLUDEOXYGLUCOSE F - 18 (FDG) INJECTION
5.6000 | Freq: Once | INTRAVENOUS | Status: AC
  Administered 2021-05-08: 5.52 via INTRAVENOUS

## 2021-05-08 MED ORDER — ONDANSETRON HCL 4 MG/2ML IJ SOLN
INTRAMUSCULAR | Status: AC
  Filled 2021-05-08: qty 2

## 2021-05-08 NOTE — Patient Instructions (Signed)
[facility] ONCOLOGY  Discharge Instructions: Thank you for choosing [facility] to provide your oncology and hematology care.   If you have a lab appointment with the [facility], please go directly to the [facility] and check in at the registration area.   Wear comfortable clothing and clothing appropriate for easy access to any Portacath or PICC line.   We strive to give you quality time with your provider. You may need to reschedule your appointment if you arrive late (15 or more minutes).  Arriving late affects you and other patients whose appointments are after yours.  Also, if you miss three or more appointments without notifying the office, you may be dismissed from the clinic at the provider's discretion.      For prescription refill requests, have your pharmacy contact our office and allow 72 hours for refills to be completed.    Today you received the following chemotherapy and/or immunotherapy agents KYPROLIS    To help prevent nausea and vomiting after your treatment, we encourage you to take your nausea medication as directed.  BELOW ARE SYMPTOMS THAT SHOULD BE REPORTED IMMEDIATELY: *FEVER GREATER THAN 100.4 F (38 C) OR HIGHER *CHILLS OR SWEATING *NAUSEA AND VOMITING THAT IS NOT CONTROLLED WITH YOUR NAUSEA MEDICATION *UNUSUAL SHORTNESS OF BREATH *UNUSUAL BRUISING OR BLEEDING *URINARY PROBLEMS (pain or burning when urinating, or frequent urination) *BOWEL PROBLEMS (unusual diarrhea, constipation, pain near the anus) TENDERNESS IN MOUTH AND THROAT WITH OR WITHOUT PRESENCE OF ULCERS (sore throat, sores in mouth, or a toothache) UNUSUAL RASH, SWELLING OR PAIN  UNUSUAL VAGINAL DISCHARGE OR ITCHING   Items with * indicate a potential emergency and should be followed up as soon as possible or go to the Emergency Department if any problems should occur.  Please show the CHEMOTHERAPY ALERT CARD or IMMUNOTHERAPY ALERT CARD at check-in to the  Emergency Department and triage nurse.  Should you have questions after your visit or need to cancel or reschedule your appointment, please contact [facility]  Dept: [phone]  and follow the prompts.  Office hours are 8:00 a.m. to 4:30 p.m. Monday - Friday. Please note that voicemails left after 4:00 p.m. may not be returned until the following business day.  We are closed weekends and major holidays. You have access to a nurse at all times for urgent questions. Please call the main number to the clinic Dept: [phone] and follow the prompts.   For any non-urgent questions, you may also contact your provider using MyChart. We now offer e-Visits for anyone 97 and older to request care online for non-urgent symptoms. For details visit [email or web address].   Also download the MyChart app! Go to the app store, search "MyChart", open the app, select [facility], and log in with your MyChart username and password.  Due to Covid, a mask is required upon entering the hospital/clinic. If you do not have a mask, one will be given to you upon arrival. For doctor visits, patients may have 1 support person aged 42 or older with them. For treatment visits, patients cannot have anyone with them due to current Covid guidelines and our immunocompromised population.

## 2021-05-13 ENCOUNTER — Telehealth: Payer: Self-pay | Admitting: Hematology

## 2021-05-13 ENCOUNTER — Other Ambulatory Visit: Payer: Self-pay

## 2021-05-13 DIAGNOSIS — C9002 Multiple myeloma in relapse: Secondary | ICD-10-CM

## 2021-05-13 NOTE — Telephone Encounter (Signed)
Changed upcoming MD appointment due to provider's emergency. Patient is aware of changes.

## 2021-05-14 ENCOUNTER — Other Ambulatory Visit: Payer: Self-pay

## 2021-05-14 ENCOUNTER — Ambulatory Visit: Payer: Medicare HMO | Admitting: Hematology

## 2021-05-14 ENCOUNTER — Other Ambulatory Visit: Payer: Medicare HMO

## 2021-05-14 ENCOUNTER — Inpatient Hospital Stay: Payer: Medicare HMO | Admitting: Hematology

## 2021-05-14 ENCOUNTER — Other Ambulatory Visit: Payer: Self-pay | Admitting: Internal Medicine

## 2021-05-14 ENCOUNTER — Inpatient Hospital Stay: Payer: Medicare HMO

## 2021-05-14 ENCOUNTER — Encounter: Payer: Self-pay | Admitting: Hematology

## 2021-05-14 VITALS — BP 184/81 | HR 65 | Temp 98.3°F | Resp 17 | Ht 61.0 in | Wt 111.1 lb

## 2021-05-14 VITALS — BP 174/91

## 2021-05-14 DIAGNOSIS — C9002 Multiple myeloma in relapse: Secondary | ICD-10-CM

## 2021-05-14 DIAGNOSIS — I10 Essential (primary) hypertension: Secondary | ICD-10-CM | POA: Diagnosis not present

## 2021-05-14 DIAGNOSIS — Z7982 Long term (current) use of aspirin: Secondary | ICD-10-CM | POA: Diagnosis not present

## 2021-05-14 DIAGNOSIS — Z5111 Encounter for antineoplastic chemotherapy: Secondary | ICD-10-CM | POA: Diagnosis not present

## 2021-05-14 DIAGNOSIS — D649 Anemia, unspecified: Secondary | ICD-10-CM | POA: Diagnosis not present

## 2021-05-14 DIAGNOSIS — M25551 Pain in right hip: Secondary | ICD-10-CM | POA: Diagnosis not present

## 2021-05-14 DIAGNOSIS — M8580 Other specified disorders of bone density and structure, unspecified site: Secondary | ICD-10-CM | POA: Diagnosis not present

## 2021-05-14 DIAGNOSIS — Z85850 Personal history of malignant neoplasm of thyroid: Secondary | ICD-10-CM | POA: Diagnosis not present

## 2021-05-14 DIAGNOSIS — Z7189 Other specified counseling: Secondary | ICD-10-CM

## 2021-05-14 DIAGNOSIS — Z79899 Other long term (current) drug therapy: Secondary | ICD-10-CM | POA: Diagnosis not present

## 2021-05-14 LAB — CBC WITH DIFFERENTIAL (CANCER CENTER ONLY)
Abs Immature Granulocytes: 0.02 10*3/uL (ref 0.00–0.07)
Basophils Absolute: 0 10*3/uL (ref 0.0–0.1)
Basophils Relative: 1 %
Eosinophils Absolute: 0.1 10*3/uL (ref 0.0–0.5)
Eosinophils Relative: 1 %
HCT: 34.9 % — ABNORMAL LOW (ref 36.0–46.0)
Hemoglobin: 11.6 g/dL — ABNORMAL LOW (ref 12.0–15.0)
Immature Granulocytes: 0 %
Lymphocytes Relative: 7 %
Lymphs Abs: 0.4 10*3/uL — ABNORMAL LOW (ref 0.7–4.0)
MCH: 33.7 pg (ref 26.0–34.0)
MCHC: 33.2 g/dL (ref 30.0–36.0)
MCV: 101.5 fL — ABNORMAL HIGH (ref 80.0–100.0)
Monocytes Absolute: 0.6 10*3/uL (ref 0.1–1.0)
Monocytes Relative: 9 %
Neutro Abs: 5.2 10*3/uL (ref 1.7–7.7)
Neutrophils Relative %: 82 %
Platelet Count: 199 10*3/uL (ref 150–400)
RBC: 3.44 MIL/uL — ABNORMAL LOW (ref 3.87–5.11)
RDW: 14.3 % (ref 11.5–15.5)
WBC Count: 6.3 10*3/uL (ref 4.0–10.5)
nRBC: 0 % (ref 0.0–0.2)

## 2021-05-14 LAB — CMP (CANCER CENTER ONLY)
ALT: 15 U/L (ref 0–44)
AST: 13 U/L — ABNORMAL LOW (ref 15–41)
Albumin: 4 g/dL (ref 3.5–5.0)
Alkaline Phosphatase: 37 U/L — ABNORMAL LOW (ref 38–126)
Anion gap: 10 (ref 5–15)
BUN: 16 mg/dL (ref 8–23)
CO2: 25 mmol/L (ref 22–32)
Calcium: 9.5 mg/dL (ref 8.9–10.3)
Chloride: 104 mmol/L (ref 98–111)
Creatinine: 0.77 mg/dL (ref 0.44–1.00)
GFR, Estimated: >60 mL/min (ref >60)
Glucose, Bld: 86 mg/dL (ref 70–99)
Potassium: 4.1 mmol/L (ref 3.5–5.1)
Sodium: 139 mmol/L (ref 135–145)
Total Bilirubin: 0.5 mg/dL (ref 0.3–1.2)
Total Protein: 6.8 g/dL (ref 6.5–8.1)

## 2021-05-14 MED ORDER — ONDANSETRON HCL 4 MG/2ML IJ SOLN
4.0000 mg | Freq: Once | INTRAMUSCULAR | Status: AC
  Administered 2021-05-14: 4 mg via INTRAVENOUS

## 2021-05-14 MED ORDER — ONDANSETRON HCL 4 MG/2ML IJ SOLN
INTRAMUSCULAR | Status: AC
  Filled 2021-05-14: qty 2

## 2021-05-14 MED ORDER — SODIUM CHLORIDE 0.9 % IV SOLN
Freq: Once | INTRAVENOUS | Status: AC
Start: 2021-05-14 — End: 2021-05-14
  Filled 2021-05-14: qty 250

## 2021-05-14 MED ORDER — ACETAMINOPHEN 500 MG PO TABS
1000.0000 mg | ORAL_TABLET | Freq: Once | ORAL | Status: AC
  Administered 2021-05-14: 1000 mg via ORAL

## 2021-05-14 MED ORDER — DEXTROSE 5 % IV SOLN
36.0000 mg/m2 | Freq: Once | INTRAVENOUS | Status: AC
  Administered 2021-05-14: 54 mg via INTRAVENOUS
  Filled 2021-05-14: qty 27

## 2021-05-14 MED ORDER — SODIUM CHLORIDE 0.9 % IV SOLN
10.0000 mg | Freq: Once | INTRAVENOUS | Status: AC
  Administered 2021-05-14: 10 mg via INTRAVENOUS
  Filled 2021-05-14: qty 10

## 2021-05-14 MED ORDER — ACETAMINOPHEN 500 MG PO TABS
ORAL_TABLET | ORAL | Status: AC
  Filled 2021-05-14: qty 2

## 2021-05-14 NOTE — Progress Notes (Signed)
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 ([place])  12/12/2018 Initial Diagnosis   Multiple myeloma not having achieved remission ([place])    12/20/2018 - 10/04/2019 Chemotherapy   The patient had dexamethasone (DECADRON) tablet 20 mg, 20 mg (100 % of original dose 20 mg), Oral, Once, 14 of 14 cycles Dose modification: 20 mg (original dose 20 mg, Cycle 1), 10 mg (original dose 20 mg, Cycle 14) Administration: 20 mg (12/20/2018), 20 mg (12/27/2018), 20 mg (01/10/2019), 20 mg (01/17/2019), 20 mg (01/31/2019), 20 mg (02/07/2019), 20 mg (03/14/2019), 20 mg (03/21/2019), 20 mg (02/21/2019), 20 mg (02/28/2019), 20 mg (04/04/2019), 20 mg (04/11/2019), 20 mg (04/25/2019), 20 mg (05/02/2019), 20 mg (05/16/2019), 20 mg (05/23/2019), 20 mg (06/06/2019), 20 mg (06/13/2019), 20 mg (06/27/2019), 20 mg (07/04/2019), 20 mg (07/18/2019), 20 mg (07/25/2019), 20 mg (08/08/2019), 20 mg (08/15/2019), 20 mg (08/29/2019), 20 mg (09/05/2019), 10 mg (09/19/2019) lenalidomide (REVLIMID) 15 MG capsule, 1 of 1 cycle, Start date: 01/18/2019, End date: 02/21/2019 bortezomib SQ (VELCADE) chemo injection 2 mg, 1.3 mg/m2 = 2 mg, Subcutaneous,  Once, 14 of 14 cycles Administration: 2 mg (12/20/2018), 2 mg (12/27/2018), 2 mg (12/23/2018), 2 mg (12/30/2018), 2 mg (01/10/2019), 2 mg (01/17/2019), 2 mg (01/13/2019), 2 mg (01/20/2019), 2 mg (01/31/2019), 2 mg (02/07/2019), 2 mg (02/03/2019), 2 mg  (02/10/2019), 2 mg (03/14/2019), 2 mg (03/17/2019), 2 mg (03/21/2019), 2 mg (03/24/2019), 2 mg (02/21/2019), 2 mg (02/24/2019), 2 mg (02/28/2019), 2 mg (03/03/2019), 2 mg (04/04/2019), 2 mg (04/06/2019), 2 mg (04/11/2019), 2 mg (04/14/2019), 2 mg (04/25/2019), 2 mg (04/28/2019), 2 mg (05/02/2019), 2 mg (05/05/2019), 2 mg (05/16/2019), 2 mg (05/19/2019), 2 mg (05/23/2019), 2 mg (05/26/2019), 2 mg (06/06/2019), 2 mg (06/09/2019), 2 mg (06/13/2019), 2 mg (06/16/2019), 2 mg (06/27/2019), 2 mg (06/30/2019), 2 mg (07/04/2019), 2 mg (07/07/2019), 2 mg (07/18/2019), 2 mg (07/21/2019), 2 mg (07/25/2019), 2 mg (07/28/2019), 2 mg (08/08/2019), 2 mg (08/11/2019), 2 mg (08/15/2019), 2 mg (08/18/2019), 2 mg (08/29/2019), 2 mg (09/01/2019), 2 mg (09/05/2019), 2 mg (09/08/2019), 2 mg (09/19/2019), 2 mg (10/04/2019)   for chemotherapy treatment.     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 I am seeing her during the absence of her primary oncologist  Dr. Irene Limbo.  She presents to the clinic alone.  She tolerated first two dose of Kyprolis well overall, she had severe headache after first dose, but resolved, and she toelrated second dose well. Mild fatigue but functions well at home  She has chronic right hip pain, on fentanyl and oxycodone PRN, controlled and stable, no other new pain. Appetite is good, no weight loss, she is able to function well at home.  All other systems were reviewed with the  patient and are negative.  MEDICAL HISTORY:  Past Medical History:  Diagnosis Date   Anemia    Glaucoma    HYPERTENSION 03/11/2007   Multiple myeloma ([place])    OSTEOPENIA 03/11/2007   Pre-diabetes    Thyroid cancer ([place])    Thyroid disease    Tubular adenoma of colon 07/2015    SURGICAL  HISTORY: Past Surgical History:  Procedure Laterality Date   BONE MARROW BIOPSY     multiple   BREAST EXCISIONAL BIOPSY Right 2000   BREAST LUMPECTOMY  1990   benign   CATARACT EXTRACTION Bilateral 2018   DILATION AND CURETTAGE OF UTERUS     bleeding at menopause. No uterine cancer   IR RADIOLOGIST EVAL & MGMT  12/13/2018   THYROIDECTOMY N/A 08/02/2020   Procedure: TOTAL THYROIDECTOMY;  Surgeon: [name], MD;  Location: [facility];  Service: General;  Laterality: N/A;   TONSILLECTOMY     age 24    I have reviewed the social history and family history with the patient and they are unchanged from previous note.  ALLERGIES:  is allergic to ace inhibitors, benadryl [diphenhydramine], diamox [acetazolamide], sulfamethoxazole, lenalidomide, and penicillins.  MEDICATIONS:  Current Outpatient Medications  Medication Sig Dispense Refill   acyclovir (ZOVIRAX) 400 MG tablet Take 1 tablet (400 mg total) by mouth 2 (two) times daily. 60 tablet 3   ALPHAGAN P 0.1 % SOLN Place 1 drop into both eyes in the morning, at noon, and at bedtime.      amLODipine (NORVASC) 5 MG tablet Take 1 tablet (5 mg total) by mouth at bedtime. 90 tablet 3   aspirin EC 81 MG tablet Take 81 mg by mouth daily.     bimatoprost (LUMIGAN) 0.03 % ophthalmic solution Place 1 drop into both eyes at bedtime.      Cholecalciferol (VITAMIN D3) 125 MCG (5000 UT) TABS Take 5,000 Units by mouth daily.     Co-Enzyme Q-10 100 MG CAPS Take 100 mg by mouth daily.     dexamethasone (DECADRON) 4 MG tablet Take 5 tablets ($RemoveBe'20mg'OpzvFXjjU$ ) on day 22 of each cycle. Repeat every 28 days.  Take with breakfast. 12 tablet 3   dorzolamide-timolol (COSOPT) 22.3-6.8 MG/ML ophthalmic solution Place 1 drop into both eyes 2 (two) times daily.   11   fentaNYL (DURAGESIC) 12 MCG/HR Place 1 patch onto the skin every 3 (three) days. 10 patch 0   hydrOXYzine (ATARAX/VISTARIL) 25 MG tablet Take 1 tablet (25 mg total) by mouth 3 (three) times daily as needed. 30 tablet  0   levothyroxine (SYNTHROID) 75 MCG tablet Take 1 tablet (75 mcg total) by mouth daily before breakfast. 90 tablet 3   magnesium chloride (SLOW-MAG) 64 MG TBEC SR tablet Take by mouth.     Multiple Vitamins-Minerals (MULTIVITAMIN WITH MINERALS) tablet Take 1 tablet by mouth daily. Centrum Silver 50 +     Omega-3 1000 MG CAPS Take 1,000 mg by mouth daily.      ondansetron (ZOFRAN) 8 MG tablet Take 8 mg by mouth 30 to 60 min prior to Cytoxan administration then take 8 mg twice daily as needed for nausea and vomiting. 30 tablet 1   oxyCODONE-acetaminophen (PERCOCET) 5-325 MG tablet Take 1-2 tablets by mouth every 4 (four) hours as needed for moderate pain or severe pain. 90 tablet 0   polyethylene glycol (MIRALAX) packet Take 17 g by mouth daily. 30 each 1   prochlorperazine (COMPAZINE) 10 MG tablet Take 1 tablet (10 mg total) by  mouth every 6 (six) hours as needed (Nausea or vomiting). 30 tablet 1   senna-docusate (SENOKOT-S) 8.6-50 MG tablet Take 2 tablets by mouth at bedtime. 60 tablet 2   Simethicone (GAS-X PO) Take 1 tablet by mouth as needed.     vitamin C (ASCORBIC ACID) 500 MG tablet Take 500 mg by mouth 2 (two) times a week.      No current facility-administered medications for this visit.    PHYSICAL EXAMINATION: ECOG PERFORMANCE STATUS: 1 - Symptomatic but completely ambulatory  Vitals:   05/14/21 0933  BP: (!) 184/81  Pulse: 65  Resp: 17  Temp: 98.3 F (36.8 C)  SpO2: 100%   Filed Weights   05/14/21 0933  Weight: 111 lb 1.6 oz (50.4 kg)    GENERAL:alert, no distress and comfortable SKIN: skin color, texture, turgor are normal, no rashes or significant lesions EYES: normal, Conjunctiva are pink and non-injected, sclera clear OROPHARYNX:no exudate, no erythema and lips, buccal mucosa, and tongue normal  NECK: supple, thyroid normal size, non-tender, without nodularity LYMPH:  no palpable lymphadenopathy in the cervical, axillary or inguinal LUNGS: clear to auscultation  and percussion with normal breathing effort HEART: regular rate & rhythm and no murmurs and no lower extremity edema ABDOMEN:abdomen soft, non-tender and normal bowel sounds Musculoskeletal:no cyanosis of digits and no clubbing  NEURO: alert & oriented x 3 with fluent speech, no focal motor/sensory deficits  LABORATORY DATA:  I have reviewed the data as listed CBC Latest Ref Rng & Units 05/14/2021 05/08/2021 05/06/2021  WBC 4.0 - 10.5 K/uL 6.3 4.2 3.7(L)  Hemoglobin 12.0 - 15.0 g/dL 11.6(L) 11.9(L) 12.0  Hematocrit 36.0 - 46.0 % 34.9(L) 35.9(L) 36.9  Platelets 150 - 400 K/uL 199 198 174     CMP Latest Ref Rng & Units 05/14/2021 05/08/2021 04/30/2021  Glucose 70 - 99 mg/dL 86 137(H) 89  BUN 8 - 23 mg/dL $Remove'16 14 16  'lIrfsAX$ Creatinine 0.44 - 1.00 mg/dL 0.77 0.77 0.75  Sodium 135 - 145 mmol/L 139 140 141  Potassium 3.5 - 5.1 mmol/L 4.1 4.3 3.9  Chloride 98 - 111 mmol/L 104 107 106  CO2 22 - 32 mmol/L $RemoveB'25 25 27  'qUGAkFhE$ Calcium 8.9 - 10.3 mg/dL 9.5 9.0 8.9  Total Protein 6.5 - 8.1 g/dL 6.8 6.5 6.8  Total Bilirubin 0.3 - 1.2 mg/dL 0.5 0.4 0.4  Alkaline Phos 38 - 126 U/L 37(L) 33(L) 33(L)  AST 15 - 41 U/L 13(L) 13(L) 15  ALT 0 - 44 U/L $Remo'15 13 12      'Axmpg$ RADIOGRAPHIC STUDIES: I have personally reviewed the radiological images as listed and agreed with the findings in the report. No results found.   ASSESSMENT & PLAN:  80 yo female  1. 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  She achieved complete response with negative M protein and negative PET scan in December 2020. -She subsequently went on N[place] maintenance therapy -Dr. Irene Limbo has recently changed her treatment to Carfilzomib and dexa on 04/30/2021 due to the concern of disease progression.  She is tolerating well  -I reviewed her recent PET scan fromAugust 4, 2022, which showed  focal activity within the right femur, consistent with active multiple myeloma, no other hypermetabolic lesion or plasmacytoma. -I also reviewed her recent bone marrow biopsy, which showed 3% plasma cells. -Her recent M protein has  slightly decreased to 0.3 -She does not meet criteria for MM relapse but concerning for early relapse, based on the above work up.  -Lab reviewed, adequate for treatment, will proceed cycle 1 day 15 Kyprolis and dexamethasone today,off chemo next week -Patient had several questions about other treatment options, I reviewed with patient. -lab, f/u and treatment in 2 weeks  -Continue Zometa every 2 months  2. Mild anemia -secondary to chemo   Plan -lab, PET scan and bone marrow biopsy results reviewed with patient in detail -Lab reviewed, adequate for treatment, will proceed to cycle 1 day 15 Kyprolis and dexamethasone today, of chemo next week (she will take dexa at home) -lab, follow-up with Dr. Irene Limbo  and cycle 2 chemo and zometa in 2 weeks   No orders of the defined types were placed in this encounter.  All questions were answered. The patient knows to call the clinic with any problems, questions or concerns. No barriers to learning was detected. I spent 25 minutes counseling the patient face to face. The total time spent in the appointment was 30 minutes and more than 50% was on counseling and review of test results     Truitt Merle, MD 05/14/21

## 2021-05-14 NOTE — Progress Notes (Signed)
Dr. Burr Medico is aware of the elevated BP.  Ok to give chemotherapy as ordered.

## 2021-05-14 NOTE — Patient Instructions (Signed)
[facility] MEDICAL [facility]COLOG[facility]  Discharge Instructions: Thank you for choosing [facility] to provide your oncology and hematology care.   If you have a lab appointment with the Cancer Center, please go directly to the Cancer Center and check in at the registration area.   Wear comfortable clothing and clothing appropriate for easy access to any Portacath or PICC line.   We strive to give you quality time with your provider. You may need to reschedule your appointment if you arrive late (15 or more minutes).  Arriving late affects you and other patients whose appointments are after yours.  Also, if you miss three or more appointments without notifying the office, you may be dismissed from the clinic at the provider's discretion.      For prescription refill requests, have your pharmacy contact our office and allow 72 hours for refills to be completed.    Today you received the following chemotherapy and/or immunotherapy agents: Kyprolis    To help prevent nausea and vomiting after your treatment, we encourage you to take your nausea medication as directed.  BELOW ARE SYMPTOMS THAT SHOULD BE REPORTED IMMEDIATELY: . *FEVER GREATER THAN 100.4 F (38 C) OR HIGHER . *CHILLS OR SWEATING . *NAUSEA AND VOMITING THAT IS NOT CONTROLLED WITH YOUR NAUSEA MEDICATION . *UNUSUAL SHORTNESS OF BREATH . *UNUSUAL BRUISING OR BLEEDING . *URINARY PROBLEMS (pain or burning when urinating, or frequent urination) . *BOWEL PROBLEMS (unusual diarrhea, constipation, pain near the anus) . TENDERNESS IN MOUTH AND THROAT WITH OR WITHOUT PRESENCE OF ULCERS (sore throat, sores in mouth, or a toothache) . UNUSUAL RASH, SWELLING OR PAIN  . UNUSUAL VAGINAL DISCHARGE OR ITCHING   Items with * indicate a potential emergency and should be followed up as soon as possible or go to the Emergency Department if any problems should occur.  Please show the CHEMOTHERAPY ALERT CARD or IMMUNOTHERAPY ALERT  CARD at check-in to the Emergency Department and triage nurse.  Should you have questions after your visit or need to cancel or reschedule your appointment, please contact [facility]  Dept: [phone]  and follow the prompts.  Office hours are 8:00 a.m. to 4:30 p.m. Monday - Friday. Please note that voicemails left after 4:00 p.m. may not be returned until the following business day.  We are closed weekends and major holidays. You have access to a nurse at all times for urgent questions. Please call the main number to the clinic Dept: [phone] and follow the prompts.   For any non-urgent questions, you may also contact your provider using MyChart. We now offer e-Visits for anyone 18 and older to request care online for non-urgent symptoms. For details visit [email or web address].   Also download the MyChart app! Go to the app store, search "MyChart", open the app, select [facility], and log in with your MyChart username and password.  Due to Covid, a mask is required upon entering the hospital/clinic. If you do not have a mask, one will be given to you upon arrival. For doctor visits, patients may have 1 support person aged 18 or older with them. For treatment visits, patients cannot have anyone with them due to current Covid guidelines and our immunocompromised population.   

## 2021-05-15 ENCOUNTER — Telehealth: Payer: Self-pay | Admitting: Hematology

## 2021-05-15 ENCOUNTER — Encounter (HOSPITAL_COMMUNITY): Payer: Self-pay | Admitting: Hematology

## 2021-05-15 NOTE — Telephone Encounter (Signed)
Scheduled follow-up appointment per 8/10 los. Patient is aware.

## 2021-05-16 ENCOUNTER — Ambulatory Visit (HOSPITAL_COMMUNITY)
Admission: RE | Admit: 2021-05-16 | Discharge: 2021-05-16 | Disposition: A | Discharge status: Home / Self Care | Payer: Medicare HMO | Source: Ambulatory Visit | Attending: Hematology | Admitting: Hematology

## 2021-05-16 ENCOUNTER — Encounter (HOSPITAL_COMMUNITY): Payer: Self-pay

## 2021-05-16 ENCOUNTER — Other Ambulatory Visit: Payer: Self-pay

## 2021-05-16 DIAGNOSIS — Z880 Allergy status to penicillin: Secondary | ICD-10-CM | POA: Diagnosis not present

## 2021-05-16 DIAGNOSIS — Z7982 Long term (current) use of aspirin: Secondary | ICD-10-CM | POA: Insufficient documentation

## 2021-05-16 DIAGNOSIS — C9002 Multiple myeloma in relapse: Secondary | ICD-10-CM | POA: Insufficient documentation

## 2021-05-16 DIAGNOSIS — C9000 Multiple myeloma not having achieved remission: Secondary | ICD-10-CM | POA: Diagnosis not present

## 2021-05-16 DIAGNOSIS — Z79890 Hormone replacement therapy (postmenopausal): Secondary | ICD-10-CM | POA: Diagnosis not present

## 2021-05-16 DIAGNOSIS — Z452 Encounter for adjustment and management of vascular access device: Secondary | ICD-10-CM | POA: Diagnosis not present

## 2021-05-16 DIAGNOSIS — Z888 Allergy status to other drugs, medicaments and biological substances status: Secondary | ICD-10-CM | POA: Insufficient documentation

## 2021-05-16 DIAGNOSIS — Z882 Allergy status to sulfonamides status: Secondary | ICD-10-CM | POA: Insufficient documentation

## 2021-05-16 DIAGNOSIS — Z79899 Other long term (current) drug therapy: Secondary | ICD-10-CM | POA: Diagnosis not present

## 2021-05-16 HISTORY — PX: IR IMAGING GUIDED PORT INSERTION: IMG5740

## 2021-05-16 MED ORDER — LIDOCAINE-EPINEPHRINE 1 %-1:100000 IJ SOLN
INTRAMUSCULAR | Status: AC | PRN
  Administered 2021-05-16: 10 mL via INTRADERMAL

## 2021-05-16 MED ORDER — HEPARIN SOD (PORK) LOCK FLUSH 100 UNIT/ML IV SOLN
INTRAVENOUS | Status: AC
  Filled 2021-05-16: qty 5

## 2021-05-16 MED ORDER — FENTANYL CITRATE (PF) 100 MCG/2ML IJ SOLN
INTRAMUSCULAR | Status: AC | PRN
  Administered 2021-05-16: 50 ug via INTRAVENOUS
  Administered 2021-05-16 (×2): 25 ug via INTRAVENOUS

## 2021-05-16 MED ORDER — LIDOCAINE HCL 1 % IJ SOLN
INTRAMUSCULAR | Status: AC
  Filled 2021-05-16: qty 20

## 2021-05-16 MED ORDER — HEPARIN SOD (PORK) LOCK FLUSH 100 UNIT/ML IV SOLN
INTRAVENOUS | Status: AC | PRN
  Administered 2021-05-16: 500 [IU] via INTRAVENOUS

## 2021-05-16 MED ORDER — LIDOCAINE-EPINEPHRINE 1 %-1:100000 IJ SOLN
INTRAMUSCULAR | Status: AC
  Filled 2021-05-16: qty 1

## 2021-05-16 MED ORDER — FENTANYL CITRATE (PF) 100 MCG/2ML IJ SOLN
INTRAMUSCULAR | Status: AC
  Filled 2021-05-16: qty 2

## 2021-05-16 MED ORDER — SODIUM CHLORIDE 0.9 % IV SOLN: INTRAVENOUS | Status: DC

## 2021-05-16 MED ORDER — MIDAZOLAM HCL 2 MG/2ML IJ SOLN
INTRAMUSCULAR | Status: AC
  Filled 2021-05-16: qty 2

## 2021-05-16 MED ORDER — LIDOCAINE HCL (PF) 1 % IJ SOLN
INTRAMUSCULAR | Status: AC | PRN
  Administered 2021-05-16: 10 mL via INTRADERMAL

## 2021-05-16 MED ORDER — MIDAZOLAM HCL 2 MG/2ML IJ SOLN
INTRAMUSCULAR | Status: AC | PRN
  Administered 2021-05-16 (×2): 0.5 mg via INTRAVENOUS
  Administered 2021-05-16: 1 mg via INTRAVENOUS

## 2021-05-16 NOTE — H&P (Signed)
Referring Physician(s): [name]  Supervising Physician: Markus Daft  Patient Status:  [facility]  Chief Complaint:  "I'm getting a port a cath"  Subjective: Patient familiar to IR service from bone marrow biopsies x3 in 2020 and on 05/06/21.  She has a history of multiple myeloma and is currently undergoing chemotherapy.  She presents again today for port a cath placement to assist with additional treatment.She currently denies fever, headache, chest pain, dyspnea, cough, abdominal pain, nausea, vomiting or bleeding.  She does have intermittent back pain.  Past Medical History:  Diagnosis Date   Anemia    Glaucoma    HYPERTENSION 03/11/2007   Multiple myeloma ([place])    OSTEOPENIA 03/11/2007   Pre-diabetes    Thyroid cancer ([place])    Thyroid disease    Tubular adenoma of colon 07/2015   Past Surgical History:  Procedure Laterality Date   BONE MARROW BIOPSY     multiple   BREAST EXCISIONAL BIOPSY Right 2000   BREAST LUMPECTOMY  1990   benign   CATARACT EXTRACTION Bilateral 2018   DILATION AND CURETTAGE OF UTERUS     bleeding at menopause. No uterine cancer   IR RADIOLOGIST EVAL & MGMT  12/13/2018   THYROIDECTOMY N/A 08/02/2020   Procedure: TOTAL THYROIDECTOMY;  Surgeon: [name], MD;  Location: [facility];  Service: General;  Laterality: N/A;   TONSILLECTOMY     age 7      Allergies: Ace inhibitors, Benadryl [diphenhydramine], Diamox [acetazolamide], Sulfamethoxazole, Lenalidomide, and Penicillins  Medications: Prior to Admission medications   Medication Sig Start Date End Date Taking? Authorizing Provider  acyclovir (ZOVIRAX) 400 MG tablet Take 1 tablet (400 mg total) by mouth 2 (two) times daily. 02/28/21  Yes Kale, Cloria [place], MD  ALPHAGAN P 0.1 % SOLN Place 1 drop into both eyes in the morning, at noon, and at bedtime.  02/02/17  Yes [provider]  amLODipine (NORVASC) 5 MG tablet Take 1 tablet (5 mg total) by mouth at bedtime. 01/27/21  Yes  [name], MD  aspirin EC 81 MG tablet Take 81 mg by mouth daily.   Yes [provider]  bimatoprost (LUMIGAN) 0.03 % ophthalmic solution Place 1 drop into both eyes at bedtime.    Yes [provider]  Cholecalciferol (VITAMIN D3) 125 MCG (5000 UT) TABS Take 5,000 Units by mouth daily.   Yes [provider]  Co-Enzyme Q-10 100 MG CAPS Take 100 mg by mouth daily.   Yes [provider]  dorzolamide-timolol (COSOPT) 22.3-6.8 MG/ML ophthalmic solution Place 1 drop into both eyes 2 (two) times daily.  03/01/17  Yes [provider]  fentaNYL (DURAGESIC) 12 MCG/HR Place 1 patch onto the skin every 3 (three) days. 04/28/21  Yes [name], MD  levothyroxine (SYNTHROID) 75 MCG tablet Take 1 tablet (75 mcg total) by mouth daily before breakfast. 01/20/21  Yes [name], MD  magnesium chloride (SLOW-MAG) 64 MG TBEC SR tablet Take by mouth.   Yes [provider]  Multiple Vitamins-Minerals (MULTIVITAMIN WITH MINERALS) tablet Take 1 tablet by mouth daily. Centrum Silver 50 +   Yes [provider]  Omega-3 1000 MG CAPS Take 1,000 mg by mouth daily.    Yes [provider]  oxyCODONE-acetaminophen (PERCOCET) 5-325 MG tablet Take 1-2 tablets by mouth every 4 (four) hours as needed for moderate pain or severe pain. 03/27/21  Yes Orson Slick, MD  polyethylene glycol [facility]) packet Take 17 g  by mouth daily. 12/12/18  Yes [name], MD  Simethicone (GAS-X PO) Take 1 tablet by mouth as needed.   Yes [provider]  vitamin C (ASCORBIC ACID) 500 MG tablet Take 500 mg by mouth 2 (two) times a week.  02/02/19  Yes [provider]  dexamethasone (DECADRON) 4 MG tablet Take 5 tablets (87m) on day 22 of each cycle. Repeat every 28 days.  Take with breakfast. 04/16/21   KBrunetta Genera MD  hydrOXYzine (ATARAX/VISTARIL) 25 MG tablet Take 1 tablet (25 mg total) by mouth 3 (three) times daily as  needed. 01/10/19   KBrunetta Genera MD  ondansetron (ZOFRAN) 8 MG tablet Take 8 mg by mouth 30 to 60 min prior to Cytoxan administration then take 8 mg twice daily as needed for nausea and vomiting. 04/16/21   KBrunetta Genera MD  prochlorperazine (COMPAZINE) 10 MG tablet Take 1 tablet (10 mg total) by mouth every 6 (six) hours as needed (Nausea or vomiting). 04/16/21   KBrunetta Genera MD  senna-docusate (SENOKOT-S) 8.6-50 MG tablet Take 2 tablets by mouth at bedtime. 12/12/18   KBrunetta Genera MD     Vital Signs: BP (!) 188/92   Pulse 61   Temp 98.3 F (36.8 C) (Oral)   Ht _0  (1.549 m)   Wt 111 lb (50.3 kg)   SpO2 99%   BMI 20.97 kg/m   Physical Exam awake/alert; chest- CTA bilat; heart- RRR; abd- soft,+BS,NT; no LE edema  Imaging: No results found.  Labs:  CBC: Recent Labs    04/30/21 0759 05/06/21 0730 05/08/21 1335 05/14/21 0923  WBC 4.9 3.7* 4.2 6.3  HGB 11.7* 12.0 11.9* 11.6*  HCT 35.7* 36.9 35.9* 34.9*  PLT 214 174 198 199    COAGS: No results for input(s): INR, APTT in the last 8760 hours.  BMP: Recent Labs    06/13/20 1112 08/01/20 0929 03/25/21 0906 04/30/21 0759 05/08/21 1335 05/14/21 0923  NA 141   < > 140 141 140 139  K 4.3   < > 3.8 3.9 4.3 4.1  CL 106   < > 105 106 107 104  CO2 29   < > _1 GLUCOSE 105*   < > 109* 89 137* 86  BUN 15   < > _2 CALCIUM 9.6   < > 9.5 8.9 9.0 9.5  CREATININE 0.73   < > 0.67 0.75 0.77 0.77  GFRNONAA >60   < > >60 >60 >60 >60  GFRAA >60  --   --   --   --   --    < > = values in this interval not displayed.    LIVER FUNCTION TESTS: Recent Labs    03/25/21 0906 04/30/21 0759 05/08/21 1335 05/14/21 0923  BILITOT 0.5 0.4 0.4 0.5  AST 21 15 13* 13*  ALT _3 ALKPHOS 37* 33* 33* 37*  PROT 7.1 6.8 6.5 6.8  ALBUMIN 4.6 3.9 3.8 4.0    Assessment and Plan: Patient familiar to IR service from bone marrow biopsies x3 in 2020 and on 05/06/21.  She has a history of  multiple myeloma and is currently undergoing chemotherapy.  She presents again today for port a cath placement to assist with additional treatment.Risks and benefits of image guided port-a-catheter placement was discussed with the patient including, but not limited to bleeding, infection, pneumothorax, or fibrin sheath development and need for additional procedures.  All of the patient's questions were answered, patient is agreeable to proceed. Consent signed and in chart.    Electronically Signed: D. Rowe Robert, PA-C 05/16/2021, 1:35 PM   I spent a total of 20 minutes at the the patient's bedside AND on the patient's hospital floor or unit, greater than 50% of which was counseling/coordinating care for port a cath placement

## 2021-05-16 NOTE — Procedures (Signed)
Interventional Radiology Procedure:   Indications: Myeloma  Procedure: Port placement  Findings: Right jugular port, tip at SVC/RA junction  Complications: None     EBL: Minimal, less than 10 ml  Plan: Discharge in one hour.  Keep port site and incisions dry for at least 24 hours.     Jermie Hippe R. Anselm Pancoast, MD  Pager: [phone]

## 2021-05-16 NOTE — Discharge Instructions (Signed)

## 2021-05-19 ENCOUNTER — Encounter (HOSPITAL_COMMUNITY): Payer: Self-pay | Admitting: Hematology

## 2021-05-20 LAB — SURGICAL PATHOLOGY

## 2021-05-22 ENCOUNTER — Other Ambulatory Visit: Payer: Self-pay

## 2021-05-22 DIAGNOSIS — C9000 Multiple myeloma not having achieved remission: Secondary | ICD-10-CM

## 2021-05-22 MED ORDER — FENTANYL 12 MCG/HR TD PT72: 1.0000 | MEDICATED_PATCH | TRANSDERMAL | 0 refills | Status: DC

## 2021-05-28 ENCOUNTER — Other Ambulatory Visit: Payer: Self-pay

## 2021-05-28 ENCOUNTER — Ambulatory Visit: Payer: Medicare HMO | Admitting: Hematology

## 2021-05-28 ENCOUNTER — Inpatient Hospital Stay: Payer: Medicare HMO | Admitting: Hematology

## 2021-05-28 ENCOUNTER — Other Ambulatory Visit: Payer: Medicare HMO

## 2021-05-28 ENCOUNTER — Inpatient Hospital Stay: Payer: Medicare HMO

## 2021-05-28 VITALS — BP 174/84 | HR 58 | Temp 98.2°F | Resp 17 | Wt 108.5 lb

## 2021-05-28 DIAGNOSIS — C9000 Multiple myeloma not having achieved remission: Secondary | ICD-10-CM

## 2021-05-28 DIAGNOSIS — I10 Essential (primary) hypertension: Secondary | ICD-10-CM | POA: Diagnosis not present

## 2021-05-28 DIAGNOSIS — Z95828 Presence of other vascular implants and grafts: Secondary | ICD-10-CM

## 2021-05-28 DIAGNOSIS — D649 Anemia, unspecified: Secondary | ICD-10-CM | POA: Diagnosis not present

## 2021-05-28 DIAGNOSIS — Z79899 Other long term (current) drug therapy: Secondary | ICD-10-CM | POA: Diagnosis not present

## 2021-05-28 DIAGNOSIS — Z7982 Long term (current) use of aspirin: Secondary | ICD-10-CM | POA: Diagnosis not present

## 2021-05-28 DIAGNOSIS — Z85850 Personal history of malignant neoplasm of thyroid: Secondary | ICD-10-CM | POA: Diagnosis not present

## 2021-05-28 DIAGNOSIS — Z7189 Other specified counseling: Secondary | ICD-10-CM

## 2021-05-28 DIAGNOSIS — C9002 Multiple myeloma in relapse: Secondary | ICD-10-CM

## 2021-05-28 DIAGNOSIS — M8580 Other specified disorders of bone density and structure, unspecified site: Secondary | ICD-10-CM | POA: Diagnosis not present

## 2021-05-28 DIAGNOSIS — M25551 Pain in right hip: Secondary | ICD-10-CM | POA: Diagnosis not present

## 2021-05-28 DIAGNOSIS — Z5111 Encounter for antineoplastic chemotherapy: Secondary | ICD-10-CM | POA: Diagnosis not present

## 2021-05-28 LAB — CBC WITH DIFFERENTIAL (CANCER CENTER ONLY)
Abs Immature Granulocytes: 0.01 10*3/uL (ref 0.00–0.07)
Basophils Absolute: 0.1 10*3/uL (ref 0.0–0.1)
Basophils Relative: 2 %
Eosinophils Absolute: 0.1 10*3/uL (ref 0.0–0.5)
Eosinophils Relative: 2 %
HCT: 33.5 % — ABNORMAL LOW (ref 36.0–46.0)
Hemoglobin: 11.3 g/dL — ABNORMAL LOW (ref 12.0–15.0)
Immature Granulocytes: 0 %
Lymphocytes Relative: 12 %
Lymphs Abs: 0.5 10*3/uL — ABNORMAL LOW (ref 0.7–4.0)
MCH: 34 pg (ref 26.0–34.0)
MCHC: 33.7 g/dL (ref 30.0–36.0)
MCV: 100.9 fL — ABNORMAL HIGH (ref 80.0–100.0)
Monocytes Absolute: 0.5 10*3/uL (ref 0.1–1.0)
Monocytes Relative: 12 %
Neutro Abs: 3.4 10*3/uL (ref 1.7–7.7)
Neutrophils Relative %: 72 %
Platelet Count: 265 10*3/uL (ref 150–400)
RBC: 3.32 MIL/uL — ABNORMAL LOW (ref 3.87–5.11)
RDW: 14.2 % (ref 11.5–15.5)
WBC Count: 4.6 10*3/uL (ref 4.0–10.5)
nRBC: 0 % (ref 0.0–0.2)

## 2021-05-28 LAB — CMP (CANCER CENTER ONLY)
ALT: 13 U/L (ref 0–44)
AST: 18 U/L (ref 15–41)
Albumin: 3.9 g/dL (ref 3.5–5.0)
Alkaline Phosphatase: 35 U/L — ABNORMAL LOW (ref 38–126)
Anion gap: 8 (ref 5–15)
BUN: 15 mg/dL (ref 8–23)
CO2: 25 mmol/L (ref 22–32)
Calcium: 9 mg/dL (ref 8.9–10.3)
Chloride: 107 mmol/L (ref 98–111)
Creatinine: 0.73 mg/dL (ref 0.44–1.00)
GFR, Estimated: >60 mL/min (ref >60)
Glucose, Bld: 104 mg/dL — ABNORMAL HIGH (ref 70–99)
Potassium: 4.3 mmol/L (ref 3.5–5.1)
Sodium: 140 mmol/L (ref 135–145)
Total Bilirubin: 0.4 mg/dL (ref 0.3–1.2)
Total Protein: 6.7 g/dL (ref 6.5–8.1)

## 2021-05-28 MED ORDER — ZOLEDRONIC ACID 4 MG/100ML IV SOLN
4.0000 mg | Freq: Once | INTRAVENOUS | Status: AC
  Administered 2021-05-28: 4 mg via INTRAVENOUS
  Filled 2021-05-28: qty 100

## 2021-05-28 MED ORDER — SODIUM CHLORIDE 0.9% FLUSH
10.0000 mL | Freq: Once | INTRAVENOUS | Status: AC
Start: 2021-05-28 — End: 2021-05-28
  Administered 2021-05-28: 10 mL via INTRAVENOUS

## 2021-05-28 MED ORDER — ACETAMINOPHEN 500 MG PO TABS
1000.0000 mg | ORAL_TABLET | Freq: Once | ORAL | Status: AC
  Administered 2021-05-28: 1000 mg via ORAL
  Filled 2021-05-28: qty 2

## 2021-05-28 MED ORDER — SODIUM CHLORIDE 0.9 % IV SOLN: Freq: Once | INTRAVENOUS | Status: AC

## 2021-05-28 MED ORDER — SODIUM CHLORIDE 0.9 % IV SOLN: Freq: Once | INTRAVENOUS | Status: DC

## 2021-05-28 MED ORDER — OXYCODONE-ACETAMINOPHEN 5-325 MG PO TABS: 1.0000 | ORAL_TABLET | ORAL | 0 refills | Status: DC | PRN

## 2021-05-28 MED ORDER — DEXTROSE 5 % IV SOLN
36.0000 mg/m2 | Freq: Once | INTRAVENOUS | Status: AC
  Administered 2021-05-28: 54 mg via INTRAVENOUS
  Filled 2021-05-28: qty 27

## 2021-05-28 MED ORDER — SODIUM CHLORIDE 0.9% FLUSH
10.0000 mL | INTRAVENOUS | Status: DC | PRN
  Administered 2021-05-28: 10 mL

## 2021-05-28 MED ORDER — SODIUM CHLORIDE 0.9 % IV SOLN
10.0000 mg | Freq: Once | INTRAVENOUS | Status: AC
  Administered 2021-05-28: 10 mg via INTRAVENOUS
  Filled 2021-05-28: qty 10

## 2021-05-28 MED ORDER — ONDANSETRON HCL 4 MG/2ML IJ SOLN
4.0000 mg | Freq: Once | INTRAMUSCULAR | Status: AC
  Administered 2021-05-28: 4 mg via INTRAVENOUS
  Filled 2021-05-28: qty 2

## 2021-05-28 MED ORDER — HEPARIN SOD (PORK) LOCK FLUSH 100 UNIT/ML IV SOLN
500.0000 [IU] | Freq: Once | INTRAVENOUS | Status: AC | PRN
  Administered 2021-05-28: 500 [IU]

## 2021-05-28 NOTE — Patient Instructions (Signed)
[facility]  Discharge Instructions: Thank you for choosing [facility] to provide your oncology and hematology care.   If you have a lab appointment with the [facility], please go directly to the [facility] and check in at the registration area.   Wear comfortable clothing and clothing appropriate for easy access to any Portacath or PICC line.   We strive to give you quality time with your provider. You may need to reschedule your appointment if you arrive late (15 or more minutes).  Arriving late affects you and other patients whose appointments are after yours.  Also, if you miss three or more appointments without notifying the office, you may be dismissed from the clinic at the provider's discretion.      For prescription refill requests, have your pharmacy contact our office and allow 72 hours for refills to be completed.    Today you received the following chemotherapy and/or immunotherapy agents: Kyprolis.      To help prevent nausea and vomiting after your treatment, we encourage you to take your nausea medication as directed.  BELOW ARE SYMPTOMS THAT SHOULD BE REPORTED IMMEDIATELY: *FEVER GREATER THAN 100.4 F (38 C) OR HIGHER *CHILLS OR SWEATING *NAUSEA AND VOMITING THAT IS NOT CONTROLLED WITH YOUR NAUSEA MEDICATION *UNUSUAL SHORTNESS OF BREATH *UNUSUAL BRUISING OR BLEEDING *URINARY PROBLEMS (pain or burning when urinating, or frequent urination) *BOWEL PROBLEMS (unusual diarrhea, constipation, pain near the anus) TENDERNESS IN MOUTH AND THROAT WITH OR WITHOUT PRESENCE OF ULCERS (sore throat, sores in mouth, or a toothache) UNUSUAL RASH, SWELLING OR PAIN  UNUSUAL VAGINAL DISCHARGE OR ITCHING   Items with * indicate a potential emergency and should be followed up as soon as possible or go to the Emergency Department if any problems should occur.  Please show the CHEMOTHERAPY ALERT CARD or IMMUNOTHERAPY ALERT CARD at check-in to  the Emergency Department and triage nurse.  Should you have questions after your visit or need to cancel or reschedule your appointment, please contact [facility]  Dept: [phone]  and follow the prompts.  Office hours are 8:00 a.m. to 4:30 p.m. Monday - Friday. Please note that voicemails left after 4:00 p.m. may not be returned until the following business day.  We are closed weekends and major holidays. You have access to a nurse at all times for urgent questions. Please call the main number to the clinic Dept: [phone] and follow the prompts.   For any non-urgent questions, you may also contact your provider using MyChart. We now offer e-Visits for anyone 48 and older to request care online for non-urgent symptoms. For details visit [email or web address].   Also download the MyChart app! Go to the app store, search "MyChart", open the app, select [facility], and log in with your MyChart username and password.  Due to Covid, a mask is required upon entering the hospital/clinic. If you do not have a mask, one will be given to you upon arrival. For doctor visits, patients may have 1 support person aged 53 or older with them. For treatment visits, patients cannot have anyone with them due to current Covid guidelines and our immunocompromised population.   Zoledronic Acid Injection (Hypercalcemia, Oncology) What is this medication? ZOLEDRONIC ACID (ZOE le dron ik AS id) slows calcium loss from bones. It high calcium levels in the blood from some kinds of cancer. It may be used in otherpeople at risk for bone loss. This medicine may be  used for other purposes; ask your health care provider orpharmacist if you have questions. COMMON BRAND NAME(S): Zometa What should I tell my care team before I take this medication? They need to know if you have any of these conditions: cancer dehydration dental disease kidney disease liver disease low levels of calcium in  the blood lung or breathing disease (asthma) receiving steroids like dexamethasone or prednisone an unusual or allergic reaction to zoledronic acid, other medicines, foods, dyes, or preservatives pregnant or trying to get pregnant breast-feeding How should I use this medication? This drug is injected into a vein. It is given by a health care provider in [facility] or clinic setting. Talk to your health care provider about the use of this drug in children.Special care may be needed. Overdosage: If you think you have taken too much of this medicine contact apoison control center or emergency room at once. NOTE: This medicine is only for you. Do not share this medicine with others. What if I miss a dose? Keep appointments for follow-up doses. It is important not to miss your dose.Call your health care provider if you are unable to keep an appointment. What may interact with this medication? certain antibiotics given by injection NSAIDs, medicines for pain and inflammation, like ibuprofen or naproxen some diuretics like bumetanide, furosemide teriparatide thalidomide This list may not describe all possible interactions. Give your health care provider a list of all the medicines, herbs, non-prescription drugs, or dietary supplements you use. Also tell them if you smoke, drink alcohol, or use illegaldrugs. Some items may interact with your medicine. What should I watch for while using this medication? Visit your health care provider for regular checks on your progress. It may besome time before you see the benefit from this drug. Some people who take this drug have severe bone, joint, or muscle pain. This drug may also increase your risk for jaw problems or a broken thigh bone. Tell your health care provider right away if you have severe pain in your jaw, bones, joints, or muscles. Tell you health care provider if you have any painthat does not go away or that gets worse. Tell your dentist and dental  surgeon that you are taking this drug. You should not have major dental surgery while on this drug. See your dentist to have a dental exam and fix any dental problems before starting this drug. Take good care of your teeth while on this drug. Make sure you see your dentist forregular follow-up appointments. You should make sure you get enough calcium and vitamin D while you are taking this drug. Discuss the foods you eat and the vitamins you take with your healthcare provider. Check with your health care provider if you have severe diarrhea, nausea, and vomiting, or if you sweat a lot. The loss of too much body fluid may make itdangerous for you to take this drug. You may need blood work done while you are taking this drug. Do not become pregnant while taking this drug. Women should inform their health care provider if they wish to become pregnant or think they might be pregnant. There is potential for serious harm to an unborn child. Talk to your healthcare provider for more information. What side effects may I notice from receiving this medication? Side effects that you should report to your doctor or health care provider assoon as possible: allergic reactions (skin rash, itching or hives; swelling of the face, lips, or tongue) bone pain infection (fever, chills, cough, sore  throat, pain or trouble passing urine) jaw pain, especially after dental work joint pain kidney injury (trouble passing urine or change in the amount of urine) low blood pressure (dizziness; feeling faint or lightheaded, falls; unusually weak or tired) low calcium levels (fast heartbeat; muscle cramps or pain; pain, tingling, or numbness in the hands or feet; seizures) low magnesium levels (fast, irregular heartbeat; muscle cramp or pain; muscle weakness; tremors; seizures) low red blood cell counts (trouble breathing; feeling faint; lightheaded, falls; unusually weak or tired) muscle pain redness, blistering, peeling, or  loosening of the skin, including inside the mouth severe diarrhea swelling of the ankles, feet, hands trouble breathing Side effects that usually do not require medical attention (report to yourdoctor or health care provider if they continue or are bothersome): anxious constipation coughing depressed mood eye irritation, itching, or pain fever general ill feeling or flu-like symptoms nausea pain, redness, or irritation at site where injected trouble sleeping This list may not describe all possible side effects. Call your doctor for medical advice about side effects. You may report side effects to FDA at1-800-FDA-1088. Where should I keep my medication? This drug is given in a hospital or clinic. It will not be stored at home. NOTE: This sheet is a summary. It may not cover all possible information. If you have questions about this medicine, talk to your doctor, pharmacist, orhealth care provider.  2022 Elsevier/Gold Standard (2019-07-06 09:13:00)

## 2021-05-29 ENCOUNTER — Other Ambulatory Visit: Payer: Self-pay

## 2021-05-29 DIAGNOSIS — C9002 Multiple myeloma in relapse: Secondary | ICD-10-CM

## 2021-05-29 MED ORDER — LIDOCAINE-PRILOCAINE 2.5-2.5 % EX CREA: 1.0000 "application " | TOPICAL_CREAM | CUTANEOUS | 0 refills | Status: DC | PRN

## 2021-05-30 ENCOUNTER — Telehealth: Payer: Self-pay | Admitting: Hematology

## 2021-05-30 NOTE — Telephone Encounter (Signed)
Scheduled follow-up appointments per 8/24 los. Patient is aware. 

## 2021-06-03 ENCOUNTER — Other Ambulatory Visit: Payer: Self-pay

## 2021-06-03 DIAGNOSIS — C9002 Multiple myeloma in relapse: Secondary | ICD-10-CM

## 2021-06-03 MED FILL — Dexamethasone Sodium Phosphate Inj 100 MG/10ML: INTRAMUSCULAR | Qty: 1 | Status: AC

## 2021-06-04 ENCOUNTER — Ambulatory Visit: Payer: Medicare HMO | Admitting: Hematology

## 2021-06-04 ENCOUNTER — Other Ambulatory Visit: Payer: Self-pay

## 2021-06-04 ENCOUNTER — Other Ambulatory Visit: Payer: Medicare HMO

## 2021-06-04 ENCOUNTER — Ambulatory Visit: Payer: Medicare HMO

## 2021-06-04 ENCOUNTER — Inpatient Hospital Stay: Payer: Medicare HMO

## 2021-06-04 ENCOUNTER — Encounter: Payer: Self-pay | Admitting: Hematology

## 2021-06-04 VITALS — BP 174/100 | HR 58 | Temp 98.3°F | Resp 17 | Wt 108.0 lb

## 2021-06-04 DIAGNOSIS — I10 Essential (primary) hypertension: Secondary | ICD-10-CM | POA: Diagnosis not present

## 2021-06-04 DIAGNOSIS — Z85850 Personal history of malignant neoplasm of thyroid: Secondary | ICD-10-CM | POA: Diagnosis not present

## 2021-06-04 DIAGNOSIS — Z5111 Encounter for antineoplastic chemotherapy: Secondary | ICD-10-CM | POA: Diagnosis not present

## 2021-06-04 DIAGNOSIS — Z7982 Long term (current) use of aspirin: Secondary | ICD-10-CM | POA: Diagnosis not present

## 2021-06-04 DIAGNOSIS — M25551 Pain in right hip: Secondary | ICD-10-CM | POA: Diagnosis not present

## 2021-06-04 DIAGNOSIS — C9000 Multiple myeloma not having achieved remission: Secondary | ICD-10-CM

## 2021-06-04 DIAGNOSIS — Z7189 Other specified counseling: Secondary | ICD-10-CM

## 2021-06-04 DIAGNOSIS — C9002 Multiple myeloma in relapse: Secondary | ICD-10-CM | POA: Diagnosis not present

## 2021-06-04 DIAGNOSIS — Z79899 Other long term (current) drug therapy: Secondary | ICD-10-CM | POA: Diagnosis not present

## 2021-06-04 DIAGNOSIS — M8580 Other specified disorders of bone density and structure, unspecified site: Secondary | ICD-10-CM | POA: Diagnosis not present

## 2021-06-04 DIAGNOSIS — D649 Anemia, unspecified: Secondary | ICD-10-CM | POA: Diagnosis not present

## 2021-06-04 LAB — CMP (CANCER CENTER ONLY)
ALT: 14 U/L (ref 0–44)
AST: 12 U/L — ABNORMAL LOW (ref 15–41)
Albumin: 3.8 g/dL (ref 3.5–5.0)
Alkaline Phosphatase: 38 U/L (ref 38–126)
Anion gap: 8 (ref 5–15)
BUN: 13 mg/dL (ref 8–23)
CO2: 26 mmol/L (ref 22–32)
Calcium: 9.2 mg/dL (ref 8.9–10.3)
Chloride: 108 mmol/L (ref 98–111)
Creatinine: 0.68 mg/dL (ref 0.44–1.00)
GFR, Estimated: >60 mL/min (ref >60)
Glucose, Bld: 101 mg/dL — ABNORMAL HIGH (ref 70–99)
Potassium: 4.2 mmol/L (ref 3.5–5.1)
Sodium: 142 mmol/L (ref 135–145)
Total Bilirubin: 0.5 mg/dL (ref 0.3–1.2)
Total Protein: 6.6 g/dL (ref 6.5–8.1)

## 2021-06-04 LAB — CBC WITH DIFFERENTIAL (CANCER CENTER ONLY)
Abs Immature Granulocytes: 0.02 10*3/uL (ref 0.00–0.07)
Basophils Absolute: 0 10*3/uL (ref 0.0–0.1)
Basophils Relative: 1 %
Eosinophils Absolute: 0 10*3/uL (ref 0.0–0.5)
Eosinophils Relative: 1 %
HCT: 34.7 % — ABNORMAL LOW (ref 36.0–46.0)
Hemoglobin: 11.5 g/dL — ABNORMAL LOW (ref 12.0–15.0)
Immature Granulocytes: 1 %
Lymphocytes Relative: 9 %
Lymphs Abs: 0.4 10*3/uL — ABNORMAL LOW (ref 0.7–4.0)
MCH: 33.5 pg (ref 26.0–34.0)
MCHC: 33.1 g/dL (ref 30.0–36.0)
MCV: 101.2 fL — ABNORMAL HIGH (ref 80.0–100.0)
Monocytes Absolute: 0.6 10*3/uL (ref 0.1–1.0)
Monocytes Relative: 13 %
Neutro Abs: 3.3 10*3/uL (ref 1.7–7.7)
Neutrophils Relative %: 75 %
Platelet Count: 173 10*3/uL (ref 150–400)
RBC: 3.43 MIL/uL — ABNORMAL LOW (ref 3.87–5.11)
RDW: 14.1 % (ref 11.5–15.5)
WBC Count: 4.3 10*3/uL (ref 4.0–10.5)
nRBC: 0 % (ref 0.0–0.2)

## 2021-06-04 MED ORDER — SODIUM CHLORIDE 0.9 % IV SOLN: Freq: Once | INTRAVENOUS | Status: DC

## 2021-06-04 MED ORDER — ONDANSETRON HCL 4 MG/2ML IJ SOLN
4.0000 mg | Freq: Once | INTRAMUSCULAR | Status: AC
  Administered 2021-06-04: 4 mg via INTRAVENOUS
  Filled 2021-06-04: qty 2

## 2021-06-04 MED ORDER — SODIUM CHLORIDE 0.9% FLUSH
10.0000 mL | Freq: Once | INTRAVENOUS | Status: AC | PRN
  Administered 2021-06-04: 10 mL

## 2021-06-04 MED ORDER — HEPARIN SOD (PORK) LOCK FLUSH 100 UNIT/ML IV SOLN
500.0000 [IU] | Freq: Once | INTRAVENOUS | Status: AC | PRN
  Administered 2021-06-04: 500 [IU]

## 2021-06-04 MED ORDER — DEXTROSE 5 % IV SOLN
36.0000 mg/m2 | Freq: Once | INTRAVENOUS | Status: AC
  Administered 2021-06-04: 54 mg via INTRAVENOUS
  Filled 2021-06-04: qty 27

## 2021-06-04 MED ORDER — SODIUM CHLORIDE 0.9 % IV SOLN
10.0000 mg | Freq: Once | INTRAVENOUS | Status: AC
  Administered 2021-06-04: 10 mg via INTRAVENOUS
  Filled 2021-06-04: qty 10

## 2021-06-04 MED ORDER — ACETAMINOPHEN 500 MG PO TABS
1000.0000 mg | ORAL_TABLET | Freq: Once | ORAL | Status: AC
  Administered 2021-06-04: 1000 mg via ORAL
  Filled 2021-06-04: qty 2

## 2021-06-04 MED ORDER — SODIUM CHLORIDE 0.9 % IV SOLN: Freq: Once | INTRAVENOUS | Status: AC

## 2021-06-04 MED ORDER — SODIUM CHLORIDE 0.9% FLUSH
10.0000 mL | INTRAVENOUS | Status: DC | PRN
  Administered 2021-06-04: 10 mL

## 2021-06-04 NOTE — Progress Notes (Signed)
[facility]   [phone] [phone]   Clinic Follow up Note   Patient Care Team: [name], MD as PCP - General (Family Medicine) [name], MD as Consulting Physician (Hematology) Bond, [name], MD as Referring Physician (Ophthalmology) [name], [name], MD as Consulting Physician (Hematology and Oncology) [name], MD as Consulting Physician (Internal Medicine) [name], MD as Consulting Physician (General Surgery) 06/04/2021  CHIEF COMPLAINT: f/u MM   SUMMARY OF ONCOLOGIC HISTORY: Oncology History  Multiple myeloma not having achieved remission ([place])  12/12/2018 Initial Diagnosis   Multiple myeloma not having achieved remission ([place])   12/20/2018 - 10/04/2019 Chemotherapy   The patient had dexamethasone (DECADRON) tablet 20 mg, 20 mg (100 % of original dose 20 mg), Oral, Once, 14 of 14 cycles Dose modification: 20 mg (original dose 20 mg, Cycle 1), 10 mg (original dose 20 mg, Cycle 14) Administration: 20 mg (12/20/2018), 20 mg (12/27/2018), 20 mg (01/10/2019), 20 mg (01/17/2019), 20 mg (01/31/2019), 20 mg (02/07/2019), 20 mg (03/14/2019), 20 mg (03/21/2019), 20 mg (02/21/2019), 20 mg (02/28/2019), 20 mg (04/04/2019), 20 mg (04/11/2019), 20 mg (04/25/2019), 20 mg (05/02/2019), 20 mg (05/16/2019), 20 mg (05/23/2019), 20 mg (06/06/2019), 20 mg (06/13/2019), 20 mg (06/27/2019), 20 mg (07/04/2019), 20 mg (07/18/2019), 20 mg (07/25/2019), 20 mg (08/08/2019), 20 mg (08/15/2019), 20 mg (08/29/2019), 20 mg (09/05/2019), 10 mg (09/19/2019) lenalidomide (REVLIMID) 15 MG capsule, 1 of 1 cycle, Start date: 01/18/2019, End date: 02/21/2019 bortezomib SQ (VELCADE) chemo injection 2 mg, 1.3 mg/m2 = 2 mg, Subcutaneous,  Once, 14 of 14 cycles Administration: 2 mg (12/20/2018), 2 mg (12/27/2018), 2 mg (12/23/2018), 2 mg (12/30/2018), 2 mg (01/10/2019), 2 mg (01/17/2019), 2 mg (01/13/2019), 2 mg (01/20/2019), 2 mg (01/31/2019), 2 mg (02/07/2019), 2 mg (02/03/2019), 2 mg  (02/10/2019), 2 mg (03/14/2019), 2 mg (03/17/2019), 2 mg (03/21/2019), 2 mg (03/24/2019), 2 mg (02/21/2019), 2 mg (02/24/2019), 2 mg (02/28/2019), 2 mg (03/03/2019), 2 mg (04/04/2019), 2 mg (04/06/2019), 2 mg (04/11/2019), 2 mg (04/14/2019), 2 mg (04/25/2019), 2 mg (04/28/2019), 2 mg (05/02/2019), 2 mg (05/05/2019), 2 mg (05/16/2019), 2 mg (05/19/2019), 2 mg (05/23/2019), 2 mg (05/26/2019), 2 mg (06/06/2019), 2 mg (06/09/2019), 2 mg (06/13/2019), 2 mg (06/16/2019), 2 mg (06/27/2019), 2 mg (06/30/2019), 2 mg (07/04/2019), 2 mg (07/07/2019), 2 mg (07/18/2019), 2 mg (07/21/2019), 2 mg (07/25/2019), 2 mg (07/28/2019), 2 mg (08/08/2019), 2 mg (08/11/2019), 2 mg (08/15/2019), 2 mg (08/18/2019), 2 mg (08/29/2019), 2 mg (09/01/2019), 2 mg (09/05/2019), 2 mg (09/08/2019), 2 mg (09/19/2019), 2 mg (10/04/2019)   for chemotherapy treatment.     Multiple myeloma in relapse ([place])  04/16/2021 Initial Diagnosis   Multiple myeloma in relapse ([place])   04/30/2021 -  Chemotherapy    Patient is on Treatment Plan: MYELOMA RELAPSED/REFRACTORY KCD Q28D         CURRENT THERAPY: Kyprolis and dexa started on 04/30/2021  INTERVAL HISTORY: Ms Pilkington returns for f/u. I am seeing her during the absence of her primary oncologist  Dr. Irene Limbo.  She presents to the clinic alone.  She tolerated first two dose of Kyprolis well overall, she had severe headache after first dose, but resolved, and she toelrated second dose well. Mild fatigue but functions well at home  She has chronic right hip pain, on fentanyl and oxycodone PRN, controlled and stable, no other new pain. Appetite is good, no weight loss, she is able to function well at home.  All other systems were reviewed with the patient and  are negative.  MEDICAL HISTORY:  Past Medical History:  Diagnosis Date   Anemia    Glaucoma    HYPERTENSION 03/11/2007   Multiple myeloma ([place])    OSTEOPENIA 03/11/2007   Pre-diabetes    Thyroid cancer ([place])    Thyroid disease    Tubular adenoma of colon 07/2015    SURGICAL  HISTORY: Past Surgical History:  Procedure Laterality Date   BONE MARROW BIOPSY     multiple   BREAST EXCISIONAL BIOPSY Right 2000   BREAST LUMPECTOMY  1990   benign   CATARACT EXTRACTION Bilateral 2018   DILATION AND CURETTAGE OF UTERUS     bleeding at menopause. No uterine cancer   IR IMAGING GUIDED PORT INSERTION  05/16/2021   IR RADIOLOGIST EVAL & MGMT  12/13/2018   THYROIDECTOMY N/A 08/02/2020   Procedure: TOTAL THYROIDECTOMY;  Surgeon: [name], MD;  Location: [facility];  Service: General;  Laterality: N/A;   TONSILLECTOMY     age 80    I have reviewed the social history and family history with the patient and they are unchanged from previous note.  ALLERGIES:  is allergic to ace inhibitors, benadryl [diphenhydramine], diamox [acetazolamide], sulfamethoxazole, lenalidomide, and penicillins.  MEDICATIONS:  Current Outpatient Medications  Medication Sig Dispense Refill   acyclovir (ZOVIRAX) 400 MG tablet Take 1 tablet (400 mg total) by mouth 2 (two) times daily. 60 tablet 3   ALPHAGAN P 0.1 % SOLN Place 1 drop into both eyes in the morning, at noon, and at bedtime.      amLODipine (NORVASC) 5 MG tablet Take 1 tablet (5 mg total) by mouth at bedtime. 90 tablet 3   aspirin EC 81 MG tablet Take 81 mg by mouth daily.     bimatoprost (LUMIGAN) 0.03 % ophthalmic solution Place 1 drop into both eyes at bedtime.      Cholecalciferol (VITAMIN D3) 125 MCG (5000 UT) TABS Take 5,000 Units by mouth daily.     Co-Enzyme Q-10 100 MG CAPS Take 100 mg by mouth daily.     dexamethasone (DECADRON) 4 MG tablet Take 5 tablets (61m) on day 22 of each cycle. Repeat every 28 days.  Take with breakfast. 12 tablet 3   dorzolamide-timolol (COSOPT) 22.3-6.8 MG/ML ophthalmic solution Place 1 drop into both eyes 2 (two) times daily.   11   fentaNYL (DURAGESIC) 12 MCG/HR Place 1 patch onto the skin every 3 (three) days. 10 patch 0   hydrOXYzine (ATARAX/VISTARIL) 25 MG tablet Take 1 tablet (25 mg total) by  mouth 3 (three) times daily as needed. 30 tablet 0   levothyroxine (SYNTHROID) 75 MCG tablet Take 1 tablet (75 mcg total) by mouth daily before breakfast. 90 tablet 3   lidocaine-prilocaine (EMLA) cream Apply 1 application topically as needed. Apply prior to port access. 30 g 0   magnesium chloride (SLOW-MAG) 64 MG TBEC SR tablet Take by mouth.     Multiple Vitamins-Minerals (MULTIVITAMIN WITH MINERALS) tablet Take 1 tablet by mouth daily. Centrum Silver 50 +     Omega-3 1000 MG CAPS Take 1,000 mg by mouth daily.      ondansetron (ZOFRAN) 8 MG tablet Take 8 mg by mouth 30 to 60 min prior to Cytoxan administration then take 8 mg twice daily as needed for nausea and vomiting. 30 tablet 1   oxyCODONE-acetaminophen (PERCOCET) 5-325 MG tablet Take 1-2 tablets by mouth every 4 (four) hours as needed for moderate pain or severe pain. 90 tablet 0   polyethylene  glycol (MIRALAX) packet Take 17 g by mouth daily. 30 each 1   prochlorperazine (COMPAZINE) 10 MG tablet Take 1 tablet (10 mg total) by mouth every 6 (six) hours as needed (Nausea or vomiting). 30 tablet 1   senna-docusate (SENOKOT-S) 8.6-50 MG tablet Take 2 tablets by mouth at bedtime. 60 tablet 2   Simethicone (GAS-X PO) Take 1 tablet by mouth as needed.     vitamin C (ASCORBIC ACID) 500 MG tablet Take 500 mg by mouth 2 (two) times a week.      No current facility-administered medications for this visit.    PHYSICAL EXAMINATION: ECOG PERFORMANCE STATUS: 1 - Symptomatic but completely ambulatory  There were no vitals filed for this visit.  There were no vitals filed for this visit.   GENERAL:alert, no distress and comfortable SKIN: skin color, texture, turgor are normal, no rashes or significant lesions EYES: normal, Conjunctiva are pink and non-injected, sclera clear OROPHARYNX:no exudate, no erythema and lips, buccal mucosa, and tongue normal  NECK: supple, thyroid normal size, non-tender, without nodularity LYMPH:  no palpable  lymphadenopathy in the cervical, axillary or inguinal LUNGS: clear to auscultation and percussion with normal breathing effort HEART: regular rate & rhythm and no murmurs and no lower extremity edema ABDOMEN:abdomen soft, non-tender and normal bowel sounds Musculoskeletal:no cyanosis of digits and no clubbing  NEURO: alert & oriented x 3 with fluent speech, no focal motor/sensory deficits  LABORATORY DATA:  I have reviewed the data as listed CBC Latest Ref Rng & Units 05/28/2021 05/14/2021 05/08/2021  WBC 4.0 - 10.5 K/uL 4.6 6.3 4.2  Hemoglobin 12.0 - 15.0 g/dL 11.3(L) 11.6(L) 11.9(L)  Hematocrit 36.0 - 46.0 % 33.5(L) 34.9(L) 35.9(L)  Platelets 150 - 400 K/uL 265 199 198     CMP Latest Ref Rng & Units 05/28/2021 05/14/2021 05/08/2021  Glucose 70 - 99 mg/dL 104(H) 86 137(H)  BUN 8 - 23 mg/dL _0 Creatinine 0.44 - 1.00 mg/dL 0.73 0.77 0.77  Sodium 135 - 145 mmol/L 140 139 140  Potassium 3.5 - 5.1 mmol/L 4.3 4.1 4.3  Chloride 98 - 111 mmol/L 107 104 107  CO2 22 - 32 mmol/L _1 Calcium 8.9 - 10.3 mg/dL 9.0 9.5 9.0  Total Protein 6.5 - 8.1 g/dL 6.7 6.8 6.5  Total Bilirubin 0.3 - 1.2 mg/dL 0.4 0.5 0.4  Alkaline Phos 38 - 126 U/L 35(L) 37(L) 33(L)  AST 15 - 41 U/L 18 13(L) 13(L)  ALT 0 - 44 U/L _2 RADIOGRAPHIC STUDIES: I have personally reviewed the radiological images as listed and agreed with the findings in the report. No results found.   ASSESSMENT & PLAN:  80 yo female  1. 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  She achieved complete response with negative M protein and negative PET scan in December 2020. -She subsequently went on N[place]o maintenance therapy PLAN -patient is here for f/u prior to C2D1 of Kd - no significant treatment toxicities -labs are stable -I reviewed her  recent PET scan fromAugust 4, 2022, which showed focal activity within the right femur, consistent with active multiple myeloma, no other hypermetabolic lesion or plasmacytoma. -I also reviewed her recent bone marrow biopsy, which showed 3% plasma cells. -Her recent M protein has slightly decreased to 0.3 -lab,  f/u and treatment in 2 weeks  -Continue Zometa every 2 months  2. Mild anemia -secondary to chemo   Follow-up Follow-up with cycle 2-day 8 and cycle 2-day 15 of carfilzomib as scheduled with port flush and labs. Please schedule cycle 3 of carfilzomib and cycle 4 of carfilzomib. Port flush and labs with each treatment. MD visit with cycle 3-day 1 in 4 weeks MD visit with cycle 4-day 1 in 8 weeks   . The total time spent in the appointment was 30 minutes and more than 50% was on counseling and direct patient cares.  Sullivan Lone MD MS Hematology/Oncology Physician [facility]

## 2021-06-10 MED FILL — Dexamethasone Sodium Phosphate Inj 100 MG/10ML: INTRAMUSCULAR | Qty: 1 | Status: AC

## 2021-06-11 ENCOUNTER — Other Ambulatory Visit: Payer: Medicare HMO

## 2021-06-11 ENCOUNTER — Other Ambulatory Visit: Payer: Self-pay

## 2021-06-11 ENCOUNTER — Inpatient Hospital Stay: Payer: Medicare HMO | Attending: Hematology

## 2021-06-11 ENCOUNTER — Inpatient Hospital Stay: Payer: Medicare HMO

## 2021-06-11 ENCOUNTER — Ambulatory Visit: Payer: Medicare HMO

## 2021-06-11 VITALS — BP 169/89 | HR 55 | Temp 98.7°F | Resp 16 | Wt 107.4 lb

## 2021-06-11 DIAGNOSIS — Z85850 Personal history of malignant neoplasm of thyroid: Secondary | ICD-10-CM | POA: Insufficient documentation

## 2021-06-11 DIAGNOSIS — M810 Age-related osteoporosis without current pathological fracture: Secondary | ICD-10-CM | POA: Insufficient documentation

## 2021-06-11 DIAGNOSIS — M2550 Pain in unspecified joint: Secondary | ICD-10-CM | POA: Diagnosis not present

## 2021-06-11 DIAGNOSIS — I10 Essential (primary) hypertension: Secondary | ICD-10-CM | POA: Insufficient documentation

## 2021-06-11 DIAGNOSIS — C9000 Multiple myeloma not having achieved remission: Secondary | ICD-10-CM | POA: Insufficient documentation

## 2021-06-11 DIAGNOSIS — Z95828 Presence of other vascular implants and grafts: Secondary | ICD-10-CM

## 2021-06-11 DIAGNOSIS — Z7982 Long term (current) use of aspirin: Secondary | ICD-10-CM | POA: Insufficient documentation

## 2021-06-11 DIAGNOSIS — D6481 Anemia due to antineoplastic chemotherapy: Secondary | ICD-10-CM | POA: Insufficient documentation

## 2021-06-11 DIAGNOSIS — C9002 Multiple myeloma in relapse: Secondary | ICD-10-CM

## 2021-06-11 DIAGNOSIS — Z5112 Encounter for antineoplastic immunotherapy: Secondary | ICD-10-CM | POA: Insufficient documentation

## 2021-06-11 DIAGNOSIS — T451X5A Adverse effect of antineoplastic and immunosuppressive drugs, initial encounter: Secondary | ICD-10-CM | POA: Insufficient documentation

## 2021-06-11 DIAGNOSIS — Z86010 Personal history of colonic polyps: Secondary | ICD-10-CM | POA: Diagnosis not present

## 2021-06-11 DIAGNOSIS — Z7189 Other specified counseling: Secondary | ICD-10-CM

## 2021-06-11 DIAGNOSIS — M8580 Other specified disorders of bone density and structure, unspecified site: Secondary | ICD-10-CM | POA: Insufficient documentation

## 2021-06-11 LAB — CBC WITH DIFFERENTIAL (CANCER CENTER ONLY)
Abs Immature Granulocytes: 0.03 10*3/uL (ref 0.00–0.07)
Basophils Absolute: 0 10*3/uL (ref 0.0–0.1)
Basophils Relative: 1 %
Eosinophils Absolute: 0.1 10*3/uL (ref 0.0–0.5)
Eosinophils Relative: 2 %
HCT: 33.8 % — ABNORMAL LOW (ref 36.0–46.0)
Hemoglobin: 11.3 g/dL — ABNORMAL LOW (ref 12.0–15.0)
Immature Granulocytes: 1 %
Lymphocytes Relative: 10 %
Lymphs Abs: 0.4 10*3/uL — ABNORMAL LOW (ref 0.7–4.0)
MCH: 33.7 pg (ref 26.0–34.0)
MCHC: 33.4 g/dL (ref 30.0–36.0)
MCV: 100.9 fL — ABNORMAL HIGH (ref 80.0–100.0)
Monocytes Absolute: 0.5 10*3/uL (ref 0.1–1.0)
Monocytes Relative: 11 %
Neutro Abs: 3.3 10*3/uL (ref 1.7–7.7)
Neutrophils Relative %: 75 %
Platelet Count: 224 10*3/uL (ref 150–400)
RBC: 3.35 MIL/uL — ABNORMAL LOW (ref 3.87–5.11)
RDW: 13.9 % (ref 11.5–15.5)
WBC Count: 4.3 10*3/uL (ref 4.0–10.5)
nRBC: 0 % (ref 0.0–0.2)

## 2021-06-11 LAB — CMP (CANCER CENTER ONLY)
ALT: 11 U/L (ref 0–44)
AST: 13 U/L — ABNORMAL LOW (ref 15–41)
Albumin: 3.9 g/dL (ref 3.5–5.0)
Alkaline Phosphatase: 33 U/L — ABNORMAL LOW (ref 38–126)
Anion gap: 10 (ref 5–15)
BUN: 13 mg/dL (ref 8–23)
CO2: 24 mmol/L (ref 22–32)
Calcium: 9.3 mg/dL (ref 8.9–10.3)
Chloride: 107 mmol/L (ref 98–111)
Creatinine: 0.71 mg/dL (ref 0.44–1.00)
GFR, Estimated: >60 mL/min (ref >60)
Glucose, Bld: 99 mg/dL (ref 70–99)
Potassium: 4.1 mmol/L (ref 3.5–5.1)
Sodium: 141 mmol/L (ref 135–145)
Total Bilirubin: 0.5 mg/dL (ref 0.3–1.2)
Total Protein: 6.6 g/dL (ref 6.5–8.1)

## 2021-06-11 MED ORDER — DEXTROSE 5 % IV SOLN
36.0000 mg/m2 | Freq: Once | INTRAVENOUS | Status: AC
  Administered 2021-06-11: 54 mg via INTRAVENOUS
  Filled 2021-06-11: qty 27

## 2021-06-11 MED ORDER — SODIUM CHLORIDE 0.9 % IV SOLN: Freq: Once | INTRAVENOUS | Status: AC

## 2021-06-11 MED ORDER — ONDANSETRON HCL 4 MG/2ML IJ SOLN
INTRAMUSCULAR | Status: AC
  Administered 2021-06-11: 4 mg via INTRAVENOUS
  Filled 2021-06-11: qty 2

## 2021-06-11 MED ORDER — SODIUM CHLORIDE 0.9% FLUSH
10.0000 mL | Freq: Once | INTRAVENOUS | Status: AC
  Administered 2021-06-11: 10 mL via INTRAVENOUS

## 2021-06-11 MED ORDER — ONDANSETRON HCL 4 MG/2ML IJ SOLN: 4.0000 mg | Freq: Once | INTRAMUSCULAR | Status: AC

## 2021-06-11 MED ORDER — ACETAMINOPHEN 500 MG PO TABS: 1000.0000 mg | ORAL_TABLET | Freq: Once | ORAL | Status: AC

## 2021-06-11 MED ORDER — SODIUM CHLORIDE 0.9 % IV SOLN
10.0000 mg | Freq: Once | INTRAVENOUS | Status: AC
  Administered 2021-06-11: 10 mg via INTRAVENOUS
  Filled 2021-06-11: qty 10

## 2021-06-11 MED ORDER — HEPARIN SOD (PORK) LOCK FLUSH 100 UNIT/ML IV SOLN
500.0000 [IU] | Freq: Once | INTRAVENOUS | Status: AC | PRN
  Administered 2021-06-11: 500 [IU]

## 2021-06-11 MED ORDER — ACETAMINOPHEN 500 MG PO TABS
ORAL_TABLET | ORAL | Status: AC
  Administered 2021-06-11: 1000 mg via ORAL
  Filled 2021-06-11: qty 2

## 2021-06-11 MED ORDER — SODIUM CHLORIDE 0.9% FLUSH
10.0000 mL | INTRAVENOUS | Status: DC | PRN
  Administered 2021-06-11: 10 mL

## 2021-06-11 NOTE — Patient Instructions (Signed)
[facility]  Discharge Instructions: Thank you for choosing [facility] to provide your oncology and hematology care.   If you have a lab appointment with the Cancer Center, please go directly to the Cancer Center and check in at the registration area.   Wear comfortable clothing and clothing appropriate for easy access to any Portacath or PICC line.   We strive to give you quality time with your provider. You may need to reschedule your appointment if you arrive late (15 or more minutes).  Arriving late affects you and other patients whose appointments are after yours.  Also, if you miss three or more appointments without notifying the office, you may be dismissed from the clinic at the provider's discretion.      For prescription refill requests, have your pharmacy contact our office and allow 72 hours for refills to be completed.    Today you received the following chemotherapy and/or immunotherapy agents: Carfilzomib.      To help prevent nausea and vomiting after your treatment, we encourage you to take your nausea medication as directed.  BELOW ARE SYMPTOMS THAT SHOULD BE REPORTED IMMEDIATELY: *FEVER GREATER THAN 100.4 F (38 C) OR HIGHER *CHILLS OR SWEATING *NAUSEA AND VOMITING THAT IS NOT CONTROLLED WITH YOUR NAUSEA MEDICATION *UNUSUAL SHORTNESS OF BREATH *UNUSUAL BRUISING OR BLEEDING *URINARY PROBLEMS (pain or burning when urinating, or frequent urination) *BOWEL PROBLEMS (unusual diarrhea, constipation, pain near the anus) TENDERNESS IN MOUTH AND THROAT WITH OR WITHOUT PRESENCE OF ULCERS (sore throat, sores in mouth, or a toothache) UNUSUAL RASH, SWELLING OR PAIN  UNUSUAL VAGINAL DISCHARGE OR ITCHING   Items with * indicate a potential emergency and should be followed up as soon as possible or go to the Emergency Department if any problems should occur.  Please show the CHEMOTHERAPY ALERT CARD or IMMUNOTHERAPY ALERT CARD at check-in  to the Emergency Department and triage nurse.  Should you have questions after your visit or need to cancel or reschedule your appointment, please contact [facility]  Dept: [phone]  and follow the prompts.  Office hours are 8:00 a.m. to 4:30 p.m. Monday - Friday. Please note that voicemails left after 4:00 p.m. may not be returned until the following business day.  We are closed weekends and major holidays. You have access to a nurse at all times for urgent questions. Please call the main number to the clinic Dept: [phone] and follow the prompts.   For any non-urgent questions, you may also contact your provider using MyChart. We now offer e-Visits for anyone 18 and older to request care online for non-urgent symptoms. For details visit [email or web address].   Also download the MyChart app! Go to the app store, search "MyChart", open the app, select , and log in with your MyChart username and password.  Due to Covid, a mask is required upon entering the hospital/clinic. If you do not have a mask, one will be given to you upon arrival. For doctor visits, patients may have 1 support person aged 18 or older with them. For treatment visits, patients cannot have anyone with them due to current Covid guidelines and our immunocompromised population.   

## 2021-06-13 ENCOUNTER — Telehealth: Payer: Self-pay

## 2021-06-13 NOTE — Telephone Encounter (Signed)
Returned call to pt regarding if ok to get covid booster post evusheld. Per Dr Irene Limbo ok to proceed since it has been at least a month since she got the evusheld. Pt acknowledged and verbalized understanding.

## 2021-06-16 ENCOUNTER — Ambulatory Visit: Payer: Medicare HMO | Admitting: Internal Medicine

## 2021-06-17 ENCOUNTER — Other Ambulatory Visit: Payer: Self-pay

## 2021-06-17 ENCOUNTER — Ambulatory Visit (INDEPENDENT_AMBULATORY_CARE_PROVIDER_SITE_OTHER): Payer: Medicare HMO

## 2021-06-17 DIAGNOSIS — Z23 Encounter for immunization: Secondary | ICD-10-CM

## 2021-06-20 ENCOUNTER — Other Ambulatory Visit: Payer: Self-pay

## 2021-06-20 DIAGNOSIS — C9000 Multiple myeloma not having achieved remission: Secondary | ICD-10-CM

## 2021-06-23 ENCOUNTER — Encounter: Payer: Self-pay | Admitting: Hematology

## 2021-06-23 DIAGNOSIS — H401112 Primary open-angle glaucoma, right eye, moderate stage: Secondary | ICD-10-CM | POA: Diagnosis not present

## 2021-06-23 DIAGNOSIS — H401122 Primary open-angle glaucoma, left eye, moderate stage: Secondary | ICD-10-CM | POA: Diagnosis not present

## 2021-06-23 MED ORDER — FENTANYL 12 MCG/HR TD PT72: 1.0000 | MEDICATED_PATCH | TRANSDERMAL | 0 refills | Status: DC

## 2021-06-25 ENCOUNTER — Inpatient Hospital Stay: Payer: Medicare HMO

## 2021-06-25 ENCOUNTER — Other Ambulatory Visit: Payer: Self-pay

## 2021-06-25 ENCOUNTER — Inpatient Hospital Stay: Payer: Medicare HMO | Admitting: Hematology

## 2021-06-25 VITALS — BP 198/86 | HR 60 | Temp 98.7°F | Resp 17 | Wt 107.4 lb

## 2021-06-25 VITALS — BP 168/75

## 2021-06-25 DIAGNOSIS — M810 Age-related osteoporosis without current pathological fracture: Secondary | ICD-10-CM | POA: Diagnosis not present

## 2021-06-25 DIAGNOSIS — Z7982 Long term (current) use of aspirin: Secondary | ICD-10-CM | POA: Diagnosis not present

## 2021-06-25 DIAGNOSIS — C9002 Multiple myeloma in relapse: Secondary | ICD-10-CM | POA: Diagnosis not present

## 2021-06-25 DIAGNOSIS — M2550 Pain in unspecified joint: Secondary | ICD-10-CM | POA: Diagnosis not present

## 2021-06-25 DIAGNOSIS — Z86010 Personal history of colonic polyps: Secondary | ICD-10-CM | POA: Diagnosis not present

## 2021-06-25 DIAGNOSIS — I10 Essential (primary) hypertension: Secondary | ICD-10-CM | POA: Diagnosis not present

## 2021-06-25 DIAGNOSIS — Z7189 Other specified counseling: Secondary | ICD-10-CM

## 2021-06-25 DIAGNOSIS — C9000 Multiple myeloma not having achieved remission: Secondary | ICD-10-CM | POA: Diagnosis not present

## 2021-06-25 DIAGNOSIS — M8580 Other specified disorders of bone density and structure, unspecified site: Secondary | ICD-10-CM | POA: Diagnosis not present

## 2021-06-25 DIAGNOSIS — Z95828 Presence of other vascular implants and grafts: Secondary | ICD-10-CM

## 2021-06-25 DIAGNOSIS — D6481 Anemia due to antineoplastic chemotherapy: Secondary | ICD-10-CM | POA: Diagnosis not present

## 2021-06-25 DIAGNOSIS — Z5112 Encounter for antineoplastic immunotherapy: Secondary | ICD-10-CM | POA: Diagnosis not present

## 2021-06-25 DIAGNOSIS — Z5111 Encounter for antineoplastic chemotherapy: Secondary | ICD-10-CM

## 2021-06-25 DIAGNOSIS — T451X5A Adverse effect of antineoplastic and immunosuppressive drugs, initial encounter: Secondary | ICD-10-CM | POA: Diagnosis not present

## 2021-06-25 DIAGNOSIS — Z85850 Personal history of malignant neoplasm of thyroid: Secondary | ICD-10-CM | POA: Diagnosis not present

## 2021-06-25 LAB — CBC WITH DIFFERENTIAL (CANCER CENTER ONLY)
Abs Immature Granulocytes: 0.02 10*3/uL (ref 0.00–0.07)
Basophils Absolute: 0.1 10*3/uL (ref 0.0–0.1)
Basophils Relative: 1 %
Eosinophils Absolute: 0.1 10*3/uL (ref 0.0–0.5)
Eosinophils Relative: 2 %
HCT: 34.3 % — ABNORMAL LOW (ref 36.0–46.0)
Hemoglobin: 11.3 g/dL — ABNORMAL LOW (ref 12.0–15.0)
Immature Granulocytes: 1 %
Lymphocytes Relative: 10 %
Lymphs Abs: 0.4 10*3/uL — ABNORMAL LOW (ref 0.7–4.0)
MCH: 33.4 pg (ref 26.0–34.0)
MCHC: 32.9 g/dL (ref 30.0–36.0)
MCV: 101.5 fL — ABNORMAL HIGH (ref 80.0–100.0)
Monocytes Absolute: 0.5 10*3/uL (ref 0.1–1.0)
Monocytes Relative: 14 %
Neutro Abs: 2.6 10*3/uL (ref 1.7–7.7)
Neutrophils Relative %: 72 %
Platelet Count: 239 10*3/uL (ref 150–400)
RBC: 3.38 MIL/uL — ABNORMAL LOW (ref 3.87–5.11)
RDW: 14.3 % (ref 11.5–15.5)
WBC Count: 3.6 10*3/uL — ABNORMAL LOW (ref 4.0–10.5)
nRBC: 0 % (ref 0.0–0.2)

## 2021-06-25 LAB — CMP (CANCER CENTER ONLY)
ALT: 11 U/L (ref 0–44)
AST: 18 U/L (ref 15–41)
Albumin: 3.9 g/dL (ref 3.5–5.0)
Alkaline Phosphatase: 31 U/L — ABNORMAL LOW (ref 38–126)
Anion gap: 10 (ref 5–15)
BUN: 15 mg/dL (ref 8–23)
CO2: 24 mmol/L (ref 22–32)
Calcium: 9.1 mg/dL (ref 8.9–10.3)
Chloride: 107 mmol/L (ref 98–111)
Creatinine: 0.71 mg/dL (ref 0.44–1.00)
GFR, Estimated: >60 mL/min (ref >60)
Glucose, Bld: 105 mg/dL — ABNORMAL HIGH (ref 70–99)
Potassium: 4.2 mmol/L (ref 3.5–5.1)
Sodium: 141 mmol/L (ref 135–145)
Total Bilirubin: 0.5 mg/dL (ref 0.3–1.2)
Total Protein: 6.6 g/dL (ref 6.5–8.1)

## 2021-06-25 MED ORDER — SODIUM CHLORIDE 0.9% FLUSH
10.0000 mL | INTRAVENOUS | Status: AC | PRN
  Administered 2021-06-25: 10 mL

## 2021-06-25 MED ORDER — SODIUM CHLORIDE 0.9% FLUSH
10.0000 mL | INTRAVENOUS | Status: DC | PRN
  Administered 2021-06-25: 10 mL

## 2021-06-25 MED ORDER — ACETAMINOPHEN 500 MG PO TABS
1000.0000 mg | ORAL_TABLET | Freq: Once | ORAL | Status: AC
  Administered 2021-06-25: 1000 mg via ORAL
  Filled 2021-06-25: qty 2

## 2021-06-25 MED ORDER — CARFILZOMIB CHEMO INJECTION 60 MG
56.0000 mg/m2 | Freq: Once | INTRAVENOUS | Status: AC
  Administered 2021-06-25: 80 mg via INTRAVENOUS
  Filled 2021-06-25: qty 10

## 2021-06-25 MED ORDER — ONDANSETRON HCL 4 MG/2ML IJ SOLN
4.0000 mg | Freq: Once | INTRAMUSCULAR | Status: AC
  Administered 2021-06-25: 4 mg via INTRAVENOUS
  Filled 2021-06-25: qty 2

## 2021-06-25 MED ORDER — SODIUM CHLORIDE 0.9 % IV SOLN: Freq: Once | INTRAVENOUS | Status: AC

## 2021-06-25 MED ORDER — HEPARIN SOD (PORK) LOCK FLUSH 100 UNIT/ML IV SOLN
500.0000 [IU] | Freq: Once | INTRAVENOUS | Status: AC | PRN
  Administered 2021-06-25: 500 [IU]

## 2021-06-25 MED ORDER — SODIUM CHLORIDE 0.9 % IV SOLN
10.0000 mg | Freq: Once | INTRAVENOUS | Status: AC
  Administered 2021-06-25: 10 mg via INTRAVENOUS
  Filled 2021-06-25: qty 10

## 2021-06-25 NOTE — Patient Instructions (Signed)
[facility]  Discharge Instructions: Thank you for choosing [facility] to provide your oncology and hematology care.   If you have a lab appointment with the Cancer Center, please go directly to the Cancer Center and check in at the registration area.   Wear comfortable clothing and clothing appropriate for easy access to any Portacath or PICC line.   We strive to give you quality time with your provider. You may need to reschedule your appointment if you arrive late (15 or more minutes).  Arriving late affects you and other patients whose appointments are after yours.  Also, if you miss three or more appointments without notifying the office, you may be dismissed from the clinic at the provider's discretion.      For prescription refill requests, have your pharmacy contact our office and allow 72 hours for refills to be completed.    Today you received the following chemotherapy and/or immunotherapy agents: Carfilzomib.      To help prevent nausea and vomiting after your treatment, we encourage you to take your nausea medication as directed.  BELOW ARE SYMPTOMS THAT SHOULD BE REPORTED IMMEDIATELY: *FEVER GREATER THAN 100.4 F (38 C) OR HIGHER *CHILLS OR SWEATING *NAUSEA AND VOMITING THAT IS NOT CONTROLLED WITH YOUR NAUSEA MEDICATION *UNUSUAL SHORTNESS OF BREATH *UNUSUAL BRUISING OR BLEEDING *URINARY PROBLEMS (pain or burning when urinating, or frequent urination) *BOWEL PROBLEMS (unusual diarrhea, constipation, pain near the anus) TENDERNESS IN MOUTH AND THROAT WITH OR WITHOUT PRESENCE OF ULCERS (sore throat, sores in mouth, or a toothache) UNUSUAL RASH, SWELLING OR PAIN  UNUSUAL VAGINAL DISCHARGE OR ITCHING   Items with * indicate a potential emergency and should be followed up as soon as possible or go to the Emergency Department if any problems should occur.  Please show the CHEMOTHERAPY ALERT CARD or IMMUNOTHERAPY ALERT CARD at check-in  to the Emergency Department and triage nurse.  Should you have questions after your visit or need to cancel or reschedule your appointment, please contact [facility]  Dept: [phone]  and follow the prompts.  Office hours are 8:00 a.m. to 4:30 p.m. Monday - Friday. Please note that voicemails left after 4:00 p.m. may not be returned until the following business day.  We are closed weekends and major holidays. You have access to a nurse at all times for urgent questions. Please call the main number to the clinic Dept: [phone] and follow the prompts.   For any non-urgent questions, you may also contact your provider using MyChart. We now offer e-Visits for anyone 18 and older to request care online for non-urgent symptoms. For details visit [email or web address].   Also download the MyChart app! Go to the app store, search "MyChart", open the app, select [facility], and log in with your MyChart username and password.  Due to Covid, a mask is required upon entering the hospital/clinic. If you do not have a mask, one will be given to you upon arrival. For doctor visits, patients may have 1 support person aged 18 or older with them. For treatment visits, patients cannot have anyone with them due to current Covid guidelines and our immunocompromised population.   

## 2021-06-30 ENCOUNTER — Ambulatory Visit (INDEPENDENT_AMBULATORY_CARE_PROVIDER_SITE_OTHER): Payer: Medicare HMO | Admitting: Internal Medicine

## 2021-06-30 ENCOUNTER — Other Ambulatory Visit: Payer: Self-pay

## 2021-06-30 ENCOUNTER — Other Ambulatory Visit (INDEPENDENT_AMBULATORY_CARE_PROVIDER_SITE_OTHER): Payer: Medicare HMO

## 2021-06-30 VITALS — BP 160/100 | HR 70 | Ht 61.0 in | Wt 109.0 lb

## 2021-06-30 DIAGNOSIS — E890 Postprocedural hypothyroidism: Secondary | ICD-10-CM

## 2021-06-30 DIAGNOSIS — C73 Malignant neoplasm of thyroid gland: Secondary | ICD-10-CM

## 2021-06-30 DIAGNOSIS — R7303 Prediabetes: Secondary | ICD-10-CM

## 2021-06-30 DIAGNOSIS — C9000 Multiple myeloma not having achieved remission: Secondary | ICD-10-CM

## 2021-06-30 LAB — T4, FREE: Free T4: 0.77 ng/dL (ref 0.60–1.60)

## 2021-06-30 LAB — TSH: TSH: 7.5 u[IU]/mL — ABNORMAL HIGH (ref 0.35–5.50)

## 2021-06-30 MED ORDER — ACYCLOVIR 400 MG PO TABS: 400.0000 mg | ORAL_TABLET | Freq: Two times a day (BID) | ORAL | 3 refills | Status: DC

## 2021-06-30 NOTE — Progress Notes (Signed)
Patient ID: Megan Olson, female   DOB: July 10, 1941, 80 y.o.   MRN: [id number]   This visit occurred during the SARS-CoV-2 public health emergency.  Safety protocols were in place, including screening questions prior to the visit, additional usage of staff PPE, and extensive cleaning of exam room while observing appropriate contact time as indicated for disinfecting solutions.   HPI  Megan Olson is a 80 y.o.-year-old very nice female, initially referred by her PCP, Dr. Yong Channel, returning for follow-up for newly diagnosed thyroid cancer and prediabetes.  Last visit 5 months ago.  Interim history: She continues chemotherapy for multiple myeloma - changed from oral to iv, and getting Dexametasone po or iv. She has 4 week cycles of ChTx with iv Dex, then 1 week off with po Dex. She is into the 3rd cycle now (out of 5-6 cycles). No increased fatigue. She is otherwise feeling well.  Weight stable since last visit.  Papillary thyroid cancer:  Reviewed history: Patient was noticed to have a FDG-avid thyroid nodule on recent PET/CT scans obtained for patient's multiple myeloma.  A thyroid ultrasound that showed 2 nodules meeting criteria for biopsy.  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  She had total thyroidectomy by Dr. Harlow Asa.  PET/CT (07/17/2019): CHEST: Persistent hypermetabolism in a partially calcified 1.8 cm left thyroid nodule, SUV max 4.5. No hypermetabolic mediastinal, hilar or axillary lymph nodes.  PET/CT (09/14/2019): HEAD/NECK: Calcified 1.5 cm in diameter left thyroid nodule maximum SUV 6.7, previously 7.7.  Thyroid U/S (06/24/2020): Parenchymal Echotexture: Mildly heterogenous Isthmus: Normal in size measuring 0.3 cm in diameter Right lobe: Atrophic in size measuring 3.7 x 1.3 x 1.5 cm Left lobe: Atrophic in size measuring 3.4 x 1.6 x 1.8 cm _________________________________________________________   Estimated total number of nodules >/= 1 cm: 2   Number of  spongiform nodules >/=  2 cm not described below (TR1): 0   Number of mixed cystic and solid nodules >/= 1.5 cm not described below (TR2): 0  _________________________________________________________   Scattered punctate (sub 5 mm) anechoic cysts and hypoechoic nodules within the right lobe of the thyroid, several which contain internal echogenic foci with ring down artifact compatible with benign colloid. None of these punctate nodules meet imaging criteria to recommend percutaneous sampling or continued dedicated follow-up.  ________________________________________________________   Nodule # 1: Location: Left; Mid - this nodule appears hypermetabolic on preceding PET-CT Maximum size: 2.0 cm; Other 2 dimensions: 1.8 x 1.7 cm Composition: cannot determine (2) Echogenicity: cannot determine (1) Shape: not taller-than-wide (0) Margins: lobulated/irregular (2) Echogenic foci: peripheral calcifications (2) Additional echogenic foci 1:  macrocalcifications (1)   **Given size (>/= 1.0 cm) and appearance, fine needle aspiration of this highly suspicious nodule should be considered based on TI-RADS criteria.  _________________________________________________________   Nodule # 2: Location: Left; mid - this nodule appears hypermetabolic on preceding PET-CT Maximum size: 1.2 cm; Other 2 dimensions: 0.9 x 0.7 cm Composition: solid/almost completely solid (2) Echogenicity: hypoechoic (2) Shape: not taller-than-wide (0) Margins: lobulated/irregular (2) Echogenic foci: none (0) *Given size (>/= 1 - 1.4 cm) and appearance, a follow-up ultrasound in 1 year should be considered based on TI-RADS criteria. However - see below: Bx recommended.  _________________________________________________________   There is an additional punctate (approximately 0.5 cm) anechoic cyst within left lobe of the thyroid which does not meet criteria to recommend percutaneous sampling or continued dedicated  follow-up.   IMPRESSION: 1. Findings suggestive of multinodular goiter. 2. Nodule #1,correlating with one  of hypermetabolic nodules seen on preceding PET-CT, meets imaging criteria to recommend percutaneous sampling as clinically indicated. 3. Nodule #2 meets TIRADS criteria to only recommend a 1 year follow-up, though as this nodule also appears hypermetabolic on preceding PET-CT, percutaneous sampling is also recommended for this nodule as hypermetabolic nodules have a higher rate of harboring malignancy. 4. None of the remaining thyroid nodules/cysts meet imaging criteria to recommend percutaneous sampling or continued dedicated follow-up.  Biopsy of the above 2 nodules.  FNAs (07/10/2020): Specimen Submitted:  A. THYROID,LEFT MID #2, FINE NEEDLE ASPIRATION:  FINAL MICROSCOPIC DIAGNOSIS:  - Findings consistent with papillary carcinoma (Bethesda category VI)  SPECIMEN ADEQUACY:  Satisfactory for evaluation   Specimen Submitted:  A. THYROID,LEFT MID #1, FINE NEEDLE ASPIRATION:  FINAL MICROSCOPIC DIAGNOSIS:  - Findings consistent with papillary carcinoma (Bethesda category VI)  SPECIMEN ADEQUACY:  Satisfactory for evaluation  Total thyroidectomy (08/02/2020): A. THYROID, TOTAL THYROIDECTOMY:  - Papillary thyroid carcinoma, classical variant, at least 3 cm  - Portion of the tumor shows hyaline fibrosis and calcification  - Anterior surface resection margin is involved by tumor  - Definite evidence of extrathyroidal extension is not identified  - Negative for lymphovascular invasion   THYROID GLAND:   Procedure: Total thyroidectomy  Tumor Focality: Unifocal  Tumor Site: Mid and inferior pole of left lobe  Tumor Size: At least 3 cm  Histologic Type: Papillary thyroid carcinoma, classical variant  Margins: The anterior surface margin is involved by carcinoma  Angioinvasion: Not identified  Lymphatic Invasion: Not identified  Extrathyroidal extension: Not identified   Regional Lymph Nodes: No lymph nodes submitted or found  Pathologic Stage Classification (pTNM, AJCC 8th Edition): pT2, pN not  assigned (no nodes submitted or found)  Comment(s): Though 2 separate lesions were described grossly, they  appeared to be part of a single tumor with prominent fibrosis and calcification of a portion of the tumor.    Lab Results  Component Value Date   THYROGLB 0.4 (L) 09/05/2020   Lab Results  Component Value Date   THGAB <1 09/05/2020   Pt denies: - feeling nodules in neck - hoarseness - dysphagia - choking - SOB with lying down  She was on levothyroxine 88 mcg daily but due to diarrhea we decreased the dose to 75 mcg daily.  She takes this: - in am - fasting - at least 1h  from b'fast - off calcium - no iron - + multivitamins at lunchtime - no PPIs - not on Biotin - + slow Mag with calcium at dinner  Reviewed her TFTs: Lab Results  Component Value Date   TSH 3.17 03/17/2021   TSH 1.96 10/30/2020   TSH 0.56 09/05/2020   TSH 1.006 09/19/2019   TSH 1.84 03/25/2016   TSH 1.47 03/22/2015   TSH 1.58 03/19/2014   TSH 1.79 03/08/2013   TSH 1.90 03/07/2012   TSH 2.04 02/17/2011   FREET4 0.86 03/17/2021   FREET4 0.90 10/30/2020   FREET4 1.04 09/05/2020   FREET4 1.10 09/19/2019    She has family history of thyroid disease: Hashimoto's thyroidism in sister. No FH of thyroid cancer. No h/o radiation tx to head or neck.  No herbal supplements. No Biotin use.   Prediabetes:  Reviewed HbA1c levels: Lab Results  Component Value Date   HGBA1C 5.9 (A) 01/13/2021   HGBA1C 5.9 (A) 06/18/2020   HGBA1C 5.8 (H) 01/26/2020   HGBA1C 5.6 10/10/2018   HGBA1C 6.3 04/04/2018   HGBA1C 5.9 09/17/2017   HGBA1C  6.2 03/26/2017   HGBA1C 5.9 03/22/2015   She does not check blood sugars at home.  + HL: Lab Results  Component Value Date   CHOL 220 (H) 01/26/2020   HDL 68 01/26/2020   LDLCALC 132 (H) 01/26/2020   TRIG 97 01/26/2020   CHOLHDL 3.2  01/26/2020  She is not on a statin.  Kidney function was normal: Lab Results  Component Value Date   BUN 15 06/25/2021   Lab Results  Component Value Date   CREATININE 0.71 06/25/2021  She is not on ACE inhibitor or ARB.  She takes an aspirin 81 mg daily.  She has a history of multiple myeloma, which is now in remission.  Previously on dexamethasone with chemotherapy, not now. Also, history of multilevel thoracic vertebral fractures.  ROS: + see HPI  I reviewed pt's medications, allergies, PMH, social hx, family hx, and changes were documented in the history of present illness. Otherwise, unchanged from my initial visit note.  Past Medical History:  Diagnosis Date   Anemia    Glaucoma    HYPERTENSION 03/11/2007   Multiple myeloma ([place])    OSTEOPENIA 03/11/2007   Pre-diabetes    Thyroid cancer ([place])    Thyroid disease    Tubular adenoma of colon 07/2015   Past Surgical History:  Procedure Laterality Date   BONE MARROW BIOPSY     multiple   BREAST EXCISIONAL BIOPSY Right 2000   BREAST LUMPECTOMY  1990   benign   CATARACT EXTRACTION Bilateral 2018   DILATION AND CURETTAGE OF UTERUS     bleeding at menopause. No uterine cancer   IR IMAGING GUIDED PORT INSERTION  05/16/2021   IR RADIOLOGIST EVAL & MGMT  12/13/2018   THYROIDECTOMY N/A 08/02/2020   Procedure: TOTAL THYROIDECTOMY;  Surgeon: [name], MD;  Location: [facility];  Service: General;  Laterality: N/A;   TONSILLECTOMY     age 12   Social History   Socioeconomic History   Marital status: Married    Spouse name: Not on file   Number of children: 1   Years of education: Not on file   Highest education level: Not on file  Occupational History   Occupation: Retired  Tobacco Use   Smoking status: Former    Packs/day: 0.50    Years: 7.00    Pack years: 3.50    Types: Cigarettes    Quit date: 01/04/1961    Years since quitting: 60.5   Smokeless tobacco: Never  Vaping Use   Vaping Use: Never used  Substance  and Sexual Activity   Alcohol use: Not Currently    Alcohol/week: 1.0 standard drink    Types: 1 Standard drinks or equivalent per week    Comment: occas    Drug use: No   Sexual activity: Not Currently  Other Topics Concern   Not on file  Social History Narrative   Married. Lives with husband (patient of Dr. Yong Channel). 1 son. No grandkids. 1 granddog.       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 60 tablet 3   ALPHAGAN P 0.1 % SOLN Place 1 drop into both eyes in the morning, at noon, and at bedtime.      amLODipine (NORVASC) 5 MG tablet Take 1 tablet (5 mg total) by mouth at bedtime. 90 tablet 3   aspirin EC 81 MG tablet Take 81 mg by mouth daily.     bimatoprost (LUMIGAN) 0.03 % ophthalmic solution Place 1 drop into both eyes at bedtime.      Cholecalciferol (VITAMIN D3) 125 MCG (5000 UT) TABS Take 5,000 Units by mouth daily.     Co-Enzyme Q-10 100 MG CAPS Take 100 mg by mouth daily.     dexamethasone (DECADRON) 4 MG tablet Take 5 tablets ($RemoveBe'20mg'eLsGJGyPb$ ) on day 22 of each cycle. Repeat every 28 days.  Take with breakfast. 12 tablet 3   dorzolamide-timolol (COSOPT) 22.3-6.8 MG/ML ophthalmic solution Place 1 drop into both eyes 2 (two) times daily.   11   fentaNYL (DURAGESIC) 12 MCG/HR Place 1 patch onto the skin every 3 (three) days. 10 patch 0   hydrOXYzine (ATARAX/VISTARIL) 25 MG tablet Take 1 tablet (25 mg total) by mouth 3 (three)  times daily as needed. 30 tablet 0   levothyroxine (SYNTHROID) 75 MCG tablet Take 1 tablet (75 mcg total) by mouth daily before breakfast. 90 tablet 3   lidocaine-prilocaine (EMLA) cream Apply 1 application topically as needed. Apply prior to port access. 30 g 0   magnesium chloride (SLOW-MAG) 64 MG TBEC SR tablet Take by mouth.     Multiple Vitamins-Minerals (MULTIVITAMIN WITH MINERALS) tablet Take 1 tablet by mouth daily. Centrum Silver 50 +     Omega-3 1000 MG CAPS Take 1,000 mg by mouth daily.      ondansetron (ZOFRAN) 8 MG tablet Take 8 mg by mouth 30 to 60 min prior to Cytoxan administration then take 8 mg twice daily as needed for nausea and vomiting. 30 tablet 1   oxyCODONE-acetaminophen (PERCOCET) 5-325 MG tablet Take 1-2 tablets by mouth every 4 (four) hours as needed for moderate pain or severe pain. 90 tablet 0   polyethylene glycol (MIRALAX) packet Take 17 g by mouth daily. 30 each 1   prochlorperazine (COMPAZINE) 10 MG tablet Take 1 tablet (10 mg total) by mouth every 6 (six) hours as needed (Nausea or vomiting). 30 tablet 1   senna-docusate (SENOKOT-S) 8.6-50 MG tablet Take 2 tablets by mouth at bedtime. 60 tablet 2   Simethicone (GAS-X PO) Take 1 tablet by mouth as needed.     vitamin C (ASCORBIC ACID) 500 MG tablet Take 500 mg by mouth 2 (two) times a week.      No current facility-administered medications on file prior to visit.   Allergies  Allergen Reactions   Ace Inhibitors Cough   Benadryl [Diphenhydramine] Other (See Comments)    Heart races   Diamox [Acetazolamide]     Hypotensive event at ophthalmologist   Sulfamethoxazole     REACTION: rash   Lenalidomide Rash   Penicillins Rash    REACTION: rash Did it involve swelling of the face/tongue/throat, SOB, or low BP? Yes Did it involve sudden or severe rash/hives, skin peeling, or any reaction on the inside of your mouth or nose? No Did you need to seek medical attention at a hospital or doctor's office? No When  did it last happen?      more than 10 years ago If all above answers are "NO", may proceed with cephalosporin use.    Family History  Problem Relation Age of Onset   Heart disease Mother        CHF mother died 44   Arthritis Mother    Glaucoma Mother        sister as well   Alcohol abuse Father    Suicidality Father    Heart disease Sister        aortic valve replacement   Lung cancer Sister        smoker   Hyperlipidemia Brother    Hypertension Brother    COPD Brother    Colon polyps Brother    Arthritis Sister    Hypertension Sister    Glaucoma Sister    Hashimoto's thyroiditis Sister    Colon polyps Sister    Hypertension Son    Stroke Maternal Grandmother    Cystic fibrosis Niece    Colon cancer Neg Hx     PE: BP (!) 160/100 (BP Location: Right Arm, Patient Position: Sitting, Cuff Size: Normal)   Pulse 70   Ht $R'5\' 1"'Un$  (1.549 m)   Wt 109 lb (49.4 kg)   SpO2 97%   BMI 20.60 kg/m  Wt Readings from Last 3 Encounters:  06/30/21 109 lb (49.4 kg)  06/25/21 107 lb 6.4 oz (48.7 kg)  06/11/21 107 lb 6.4 oz (48.7 kg)   Constitutional: thin, in NAD Eyes: PERRLA, EOMI, no exophthalmos ENT: moist mucous membranes, no neck masses, thyroidectomy scar healed, no cervical lymphadenopathy Cardiovascular: RRR, No MRG Respiratory: CTA B Gastrointestinal: abdomen soft, NT, ND, BS+ Musculoskeletal: no deformities, strength intact in all 4 Skin: moist, warm, no rashes Neurological: no tremor with outstretched hands, DTR normal in all 4  ASSESSMENT: 1.  Papillary thyroid cancer, classic variant  2.  Postsurgical hypothyroidism  3.  Prediabetes  PLAN: 1. Thyroid cancer - papillary -Patient with history of clinically stage I TNM thyroid cancer, with lower clinical risk due to tumor size smaller than 4 cm, no lymphovascular invasion, no atypical features (except for few calcifications and hyalinization-possibly associated with slightly higher risk) lack of extrathyroidal  extension. -We discussed that PTC is a cancer with good prognosis and her life expectancy and quality of life are unlikely to be affected by her cancer -We did discuss that because of the low-intermediate risk of her tumor, RAI treatment for post op thyroid remnant ablation is not mandatory.  We decided to follow her without RAI treatment, especially as she is also on treatment for multiple myeloma.  We did discuss that if we decide to perform RAI treatment later in the disease, this will likely not affect her prognosis -States she did not have RAI treatment, her thyroglobulin levels may not become undetectable.  We discussed that the trend, rather than the absolute values, it is important.  At last check, her thyroglobulin was 0.4 (09/2020).  We will recheck this now. - I will see the patient in 6 months  2.  Patient with history of total thyroidectomy for thyroid cancer, now with iatrogenic hypothyroidism, on levothyroxine therapy - latest thyroid labs reviewed with pt. >> normal: Lab Results  Component Value Date   TSH 3.17 03/17/2021  - she continues on LT4 75 mcg daily  - she feels well on this dose  - she had IV dexamethasone 5 days ago and is due to take dexamethasone again  in 2 days - we discussed about taking the thyroid hormone every day, with water, >30 minutes before breakfast, separated by >4 hours from acid reflux medications, calcium, iron, multivitamins. Pt. is taking it correctly. - will check thyroid tests today: TSH and fT4 - If labs are abnormal, she will need to return for repeat TFTs in 1.5 months  2.  Prediabetes -HbA1c levels are close to the lower limit of normal: 5.9% at last 2 checks -She would like to defer checking an HbA1c now until she sees PCP for next visit -We can continue to follow her without medications for now  Component     Latest Ref Rng & Units 06/30/2021  Thyroglobulin     ng/mL 1.0 (L)  TSH     0.35 - 5.50 uIU/mL 7.50 (H)  T4,Free(Direct)      0.60 - 1.60 ng/dL 0.77  Thyroglobulin Ab     < or = 1 IU/mL <1  TSH is much higher than before.  Also, thyroglobulin is higher, most likely due to the increase in TSH. Will increase her LT4 dose to 88 mcg daily and have her return for labs in 1.5 months.  [name], MD PhD [facility]

## 2021-06-30 NOTE — Patient Instructions (Signed)
Please continue Levothyroxine 75 mcg daily.  Take the thyroid hormone every day, with water, at least 30 minutes before breakfast, separated by at least 4 hours from: - acid reflux medications - calcium - iron - multivitamins   Please stop at the lab.  Please return in 6 months.

## 2021-07-01 ENCOUNTER — Encounter: Payer: Self-pay | Admitting: Hematology

## 2021-07-01 ENCOUNTER — Encounter: Payer: Self-pay | Admitting: Internal Medicine

## 2021-07-01 LAB — THYROGLOBULIN LEVEL: Thyroglobulin: 1 ng/mL — ABNORMAL LOW

## 2021-07-01 LAB — THYROGLOBULIN ANTIBODY: Thyroglobulin Ab: <1 IU/mL (ref <=1)

## 2021-07-01 MED ORDER — LEVOTHYROXINE SODIUM 88 MCG PO TABS: 88.0000 ug | ORAL_TABLET | Freq: Every day | ORAL | 5 refills | Status: DC

## 2021-07-01 MED FILL — Dexamethasone Sodium Phosphate Inj 100 MG/10ML: INTRAMUSCULAR | Qty: 1 | Status: AC

## 2021-07-01 NOTE — Progress Notes (Signed)
Received call from Shawn Stall (nurse) stating pt received a big bill for her treatment for Aug dos and wanted to inquire about copay assistance.  After reviewing I called the pt and informed her of copay assistance w/ the [facility] [place].  Pt wanted to apply so she gave me consent to apply in her behalf so I completed the online application and she was approved for $12,000 for 12 months from 07/01/21 w/ a 6 month look back period.

## 2021-07-02 ENCOUNTER — Inpatient Hospital Stay: Payer: Medicare HMO

## 2021-07-02 ENCOUNTER — Telehealth: Payer: Self-pay | Admitting: Internal Medicine

## 2021-07-02 ENCOUNTER — Other Ambulatory Visit: Payer: Self-pay

## 2021-07-02 ENCOUNTER — Encounter: Payer: Self-pay | Admitting: Hematology

## 2021-07-02 VITALS — BP 170/92 | HR 57 | Temp 98.4°F | Resp 18 | Wt 107.8 lb

## 2021-07-02 DIAGNOSIS — E890 Postprocedural hypothyroidism: Secondary | ICD-10-CM

## 2021-07-02 DIAGNOSIS — Z5112 Encounter for antineoplastic immunotherapy: Secondary | ICD-10-CM | POA: Diagnosis not present

## 2021-07-02 DIAGNOSIS — T451X5A Adverse effect of antineoplastic and immunosuppressive drugs, initial encounter: Secondary | ICD-10-CM | POA: Diagnosis not present

## 2021-07-02 DIAGNOSIS — M8580 Other specified disorders of bone density and structure, unspecified site: Secondary | ICD-10-CM | POA: Diagnosis not present

## 2021-07-02 DIAGNOSIS — M2550 Pain in unspecified joint: Secondary | ICD-10-CM | POA: Diagnosis not present

## 2021-07-02 DIAGNOSIS — Z5111 Encounter for antineoplastic chemotherapy: Secondary | ICD-10-CM

## 2021-07-02 DIAGNOSIS — D6481 Anemia due to antineoplastic chemotherapy: Secondary | ICD-10-CM | POA: Diagnosis not present

## 2021-07-02 DIAGNOSIS — Z86010 Personal history of colonic polyps: Secondary | ICD-10-CM | POA: Diagnosis not present

## 2021-07-02 DIAGNOSIS — C9000 Multiple myeloma not having achieved remission: Secondary | ICD-10-CM | POA: Diagnosis not present

## 2021-07-02 DIAGNOSIS — M810 Age-related osteoporosis without current pathological fracture: Secondary | ICD-10-CM | POA: Diagnosis not present

## 2021-07-02 DIAGNOSIS — I10 Essential (primary) hypertension: Secondary | ICD-10-CM | POA: Diagnosis not present

## 2021-07-02 DIAGNOSIS — C9002 Multiple myeloma in relapse: Secondary | ICD-10-CM

## 2021-07-02 DIAGNOSIS — Z7189 Other specified counseling: Secondary | ICD-10-CM

## 2021-07-02 DIAGNOSIS — Z7982 Long term (current) use of aspirin: Secondary | ICD-10-CM | POA: Diagnosis not present

## 2021-07-02 DIAGNOSIS — Z95828 Presence of other vascular implants and grafts: Secondary | ICD-10-CM

## 2021-07-02 DIAGNOSIS — Z85850 Personal history of malignant neoplasm of thyroid: Secondary | ICD-10-CM | POA: Diagnosis not present

## 2021-07-02 LAB — CBC WITH DIFFERENTIAL/PLATELET
Abs Immature Granulocytes: 0.02 10*3/uL (ref 0.00–0.07)
Basophils Absolute: 0 10*3/uL (ref 0.0–0.1)
Basophils Relative: 1 %
Eosinophils Absolute: 0.1 10*3/uL (ref 0.0–0.5)
Eosinophils Relative: 1 %
HCT: 33.8 % — ABNORMAL LOW (ref 36.0–46.0)
Hemoglobin: 11.2 g/dL — ABNORMAL LOW (ref 12.0–15.0)
Immature Granulocytes: 1 %
Lymphocytes Relative: 8 %
Lymphs Abs: 0.3 10*3/uL — ABNORMAL LOW (ref 0.7–4.0)
MCH: 33.6 pg (ref 26.0–34.0)
MCHC: 33.1 g/dL (ref 30.0–36.0)
MCV: 101.5 fL — ABNORMAL HIGH (ref 80.0–100.0)
Monocytes Absolute: 0.5 10*3/uL (ref 0.1–1.0)
Monocytes Relative: 14 %
Neutro Abs: 2.8 10*3/uL (ref 1.7–7.7)
Neutrophils Relative %: 75 %
Platelets: 157 10*3/uL (ref 150–400)
RBC: 3.33 MIL/uL — ABNORMAL LOW (ref 3.87–5.11)
RDW: 14.1 % (ref 11.5–15.5)
WBC: 3.8 10*3/uL — ABNORMAL LOW (ref 4.0–10.5)
nRBC: 0 % (ref 0.0–0.2)

## 2021-07-02 LAB — CMP (CANCER CENTER ONLY)
ALT: 13 U/L (ref 0–44)
AST: 13 U/L — ABNORMAL LOW (ref 15–41)
Albumin: 3.8 g/dL (ref 3.5–5.0)
Alkaline Phosphatase: 37 U/L — ABNORMAL LOW (ref 38–126)
Anion gap: 10 (ref 5–15)
BUN: 15 mg/dL (ref 8–23)
CO2: 24 mmol/L (ref 22–32)
Calcium: 9.3 mg/dL (ref 8.9–10.3)
Chloride: 107 mmol/L (ref 98–111)
Creatinine: 0.67 mg/dL (ref 0.44–1.00)
GFR, Estimated: >60 mL/min (ref >60)
Glucose, Bld: 101 mg/dL — ABNORMAL HIGH (ref 70–99)
Potassium: 4.1 mmol/L (ref 3.5–5.1)
Sodium: 141 mmol/L (ref 135–145)
Total Bilirubin: 0.6 mg/dL (ref 0.3–1.2)
Total Protein: 6.5 g/dL (ref 6.5–8.1)

## 2021-07-02 LAB — VITAMIN D 25 HYDROXY (VIT D DEFICIENCY, FRACTURES): Vit D, 25-Hydroxy: 71.61 ng/mL (ref 30–100)

## 2021-07-02 MED ORDER — SODIUM CHLORIDE 0.9% FLUSH: 10.0000 mL | Freq: Once | INTRAVENOUS | Status: DC

## 2021-07-02 MED ORDER — DEXTROSE 5 % IV SOLN
56.0000 mg/m2 | Freq: Once | INTRAVENOUS | Status: AC
  Administered 2021-07-02: 80 mg via INTRAVENOUS
  Filled 2021-07-02: qty 30

## 2021-07-02 MED ORDER — SODIUM CHLORIDE 0.9 % IV SOLN
10.0000 mg | Freq: Once | INTRAVENOUS | Status: AC
  Administered 2021-07-02: 10 mg via INTRAVENOUS
  Filled 2021-07-02: qty 10

## 2021-07-02 MED ORDER — HEPARIN SOD (PORK) LOCK FLUSH 100 UNIT/ML IV SOLN
500.0000 [IU] | Freq: Once | INTRAVENOUS | Status: AC | PRN
  Administered 2021-07-02: 500 [IU]

## 2021-07-02 MED ORDER — SODIUM CHLORIDE 0.9 % IV SOLN: Freq: Once | INTRAVENOUS | Status: AC

## 2021-07-02 MED ORDER — SODIUM CHLORIDE 0.9% FLUSH
10.0000 mL | INTRAVENOUS | Status: DC | PRN
  Administered 2021-07-02: 10 mL

## 2021-07-02 MED ORDER — ONDANSETRON HCL 4 MG/2ML IJ SOLN
4.0000 mg | Freq: Once | INTRAMUSCULAR | Status: AC
  Administered 2021-07-02: 4 mg via INTRAVENOUS
  Filled 2021-07-02: qty 2

## 2021-07-02 MED ORDER — ACETAMINOPHEN 500 MG PO TABS
1000.0000 mg | ORAL_TABLET | Freq: Once | ORAL | Status: AC
  Administered 2021-07-02: 1000 mg via ORAL
  Filled 2021-07-02: qty 2

## 2021-07-02 NOTE — Progress Notes (Signed)
[place]   [phone] Fax:([phone]) [phone]   Clinic Follow up Note   Patient Care Team: [name], MD as PCP - General (Family Medicine) [name], MD as Consulting Physician (Hematology) Bond, [name], MD as Referring Physician (Ophthalmology) [name], [name], MD as Consulting Physician (Hematology and Oncology) [name], MD as Consulting Physician (Internal Medicine) [name], MD as Consulting Physician (General Surgery) 07/02/2021  CHIEF COMPLAINT: f/u MM   SUMMARY OF ONCOLOGIC HISTORY: Oncology History  Multiple myeloma not having achieved remission ([place])  12/12/2018 Initial Diagnosis   Multiple myeloma not having achieved remission ([place])   12/20/2018 - 10/04/2019 Chemotherapy   The patient had dexamethasone (DECADRON) tablet 20 mg, 20 mg (100 % of original dose 20 mg), Oral, Once, 14 of 14 cycles Dose modification: 20 mg (original dose 20 mg, Cycle 1), 10 mg (original dose 20 mg, Cycle 14) Administration: 20 mg (12/20/2018), 20 mg (12/27/2018), 20 mg (01/10/2019), 20 mg (01/17/2019), 20 mg (01/31/2019), 20 mg (02/07/2019), 20 mg (03/14/2019), 20 mg (03/21/2019), 20 mg (02/21/2019), 20 mg (02/28/2019), 20 mg (04/04/2019), 20 mg (04/11/2019), 20 mg (04/25/2019), 20 mg (05/02/2019), 20 mg (05/16/2019), 20 mg (05/23/2019), 20 mg (06/06/2019), 20 mg (06/13/2019), 20 mg (06/27/2019), 20 mg (07/04/2019), 20 mg (07/18/2019), 20 mg (07/25/2019), 20 mg (08/08/2019), 20 mg (08/15/2019), 20 mg (08/29/2019), 20 mg (09/05/2019), 10 mg (09/19/2019) lenalidomide (REVLIMID) 15 MG capsule, 1 of 1 cycle, Start date: 01/18/2019, End date: 02/21/2019 bortezomib SQ (VELCADE) chemo injection 2 mg, 1.3 mg/m2 = 2 mg, Subcutaneous,  Once, 14 of 14 cycles Administration: 2 mg (12/20/2018), 2 mg (12/27/2018), 2 mg (12/23/2018), 2 mg (12/30/2018), 2 mg (01/10/2019), 2 mg (01/17/2019), 2 mg (01/13/2019), 2 mg (01/20/2019), 2 mg (01/31/2019), 2 mg (02/07/2019), 2 mg (02/03/2019), 2 mg  (02/10/2019), 2 mg (03/14/2019), 2 mg (03/17/2019), 2 mg (03/21/2019), 2 mg (03/24/2019), 2 mg (02/21/2019), 2 mg (02/24/2019), 2 mg (02/28/2019), 2 mg (03/03/2019), 2 mg (04/04/2019), 2 mg (04/06/2019), 2 mg (04/11/2019), 2 mg (04/14/2019), 2 mg (04/25/2019), 2 mg (04/28/2019), 2 mg (05/02/2019), 2 mg (05/05/2019), 2 mg (05/16/2019), 2 mg (05/19/2019), 2 mg (05/23/2019), 2 mg (05/26/2019), 2 mg (06/06/2019), 2 mg (06/09/2019), 2 mg (06/13/2019), 2 mg (06/16/2019), 2 mg (06/27/2019), 2 mg (06/30/2019), 2 mg (07/04/2019), 2 mg (07/07/2019), 2 mg (07/18/2019), 2 mg (07/21/2019), 2 mg (07/25/2019), 2 mg (07/28/2019), 2 mg (08/08/2019), 2 mg (08/11/2019), 2 mg (08/15/2019), 2 mg (08/18/2019), 2 mg (08/29/2019), 2 mg (09/01/2019), 2 mg (09/05/2019), 2 mg (09/08/2019), 2 mg (09/19/2019), 2 mg (10/04/2019)   for chemotherapy treatment.     Multiple myeloma in relapse ([place])  04/16/2021 Initial Diagnosis   Multiple myeloma in relapse ([place])   04/30/2021 -  Chemotherapy    Patient is on Treatment Plan: MYELOMA RELAPSED/REFRACTORY KCD Q28D         CURRENT THERAPY: Kyprolis and dexa started on 04/30/2021  INTERVAL HISTORY: Ms Dopson returns for f/u.  For continued management of her multiple myeloma. Notes no acute new symptoms. She is tolerating the carfilzomib without any prohibitive toxicities. She has chronic right hip pain, on fentanyl and oxycodone PRN, controlled and stable, no other new pain. Appetite is good, no weight loss, she is able to function well at home.  All other systems were reviewed with the patient and are negative.  MEDICAL HISTORY:  Past Medical History:  Diagnosis Date   Anemia    Glaucoma    HYPERTENSION 03/11/2007   Multiple myeloma ([place])  OSTEOPENIA 03/11/2007   Pre-diabetes    Thyroid cancer ([place])    Thyroid disease    Tubular adenoma of colon 07/2015    SURGICAL HISTORY: Past Surgical History:  Procedure Laterality Date   BONE MARROW BIOPSY     multiple   BREAST EXCISIONAL BIOPSY Right 2000   BREAST  LUMPECTOMY  1990   benign   CATARACT EXTRACTION Bilateral 2018   DILATION AND CURETTAGE OF UTERUS     bleeding at menopause. No uterine cancer   IR IMAGING GUIDED PORT INSERTION  05/16/2021   IR RADIOLOGIST EVAL & MGMT  12/13/2018   THYROIDECTOMY N/A 08/02/2020   Procedure: TOTAL THYROIDECTOMY;  Surgeon: [name], MD;  Location: [facility];  Service: General;  Laterality: N/A;   TONSILLECTOMY     age 34    I have reviewed the social history and family history with the patient and they are unchanged from previous note.  ALLERGIES:  is allergic to ace inhibitors, benadryl [diphenhydramine], diamox [acetazolamide], sulfamethoxazole, lenalidomide, and penicillins.  MEDICATIONS:  Current Outpatient Medications  Medication Sig Dispense Refill   acyclovir (ZOVIRAX) 400 MG tablet Take 1 tablet (400 mg total) by mouth 2 (two) times daily. 60 tablet 3   ALPHAGAN P 0.1 % SOLN Place 1 drop into both eyes in the morning, at noon, and at bedtime.      amLODipine (NORVASC) 5 MG tablet Take 1 tablet (5 mg total) by mouth at bedtime. 90 tablet 3   aspirin EC 81 MG tablet Take 81 mg by mouth daily.     bimatoprost (LUMIGAN) 0.03 % ophthalmic solution Place 1 drop into both eyes at bedtime.      Cholecalciferol (VITAMIN D3) 125 MCG (5000 UT) TABS Take 5,000 Units by mouth daily.     Co-Enzyme Q-10 100 MG CAPS Take 100 mg by mouth daily.     dexamethasone (DECADRON) 4 MG tablet Take 5 tablets (74m) on day 22 of each cycle. Repeat every 28 days.  Take with breakfast. 12 tablet 3   dorzolamide-timolol (COSOPT) 22.3-6.8 MG/ML ophthalmic solution Place 1 drop into both eyes 2 (two) times daily.   11   fentaNYL (DURAGESIC) 12 MCG/HR Place 1 patch onto the skin every 3 (three) days. 10 patch 0   hydrOXYzine (ATARAX/VISTARIL) 25 MG tablet Take 1 tablet (25 mg total) by mouth 3 (three) times daily as needed. 30 tablet 0   levothyroxine (SYNTHROID) 88 MCG tablet Take 1 tablet (88 mcg total) by mouth daily before  breakfast. 45 tablet 5   lidocaine-prilocaine (EMLA) cream Apply 1 application topically as needed. Apply prior to port access. 30 g 0   magnesium chloride (SLOW-MAG) 64 MG TBEC SR tablet Take by mouth.     Multiple Vitamins-Minerals (MULTIVITAMIN WITH MINERALS) tablet Take 1 tablet by mouth daily. Centrum Silver 50 +     Omega-3 1000 MG CAPS Take 1,000 mg by mouth daily.      ondansetron (ZOFRAN) 8 MG tablet Take 8 mg by mouth 30 to 60 min prior to Cytoxan administration then take 8 mg twice daily as needed for nausea and vomiting. 30 tablet 1   oxyCODONE-acetaminophen (PERCOCET) 5-325 MG tablet Take 1-2 tablets by mouth every 4 (four) hours as needed for moderate pain or severe pain. 90 tablet 0   polyethylene glycol (MIRALAX) packet Take 17 g by mouth daily. 30 each 1   prochlorperazine (COMPAZINE) 10 MG tablet Take 1 tablet (10 mg total) by mouth every 6 (six) hours as  needed (Nausea or vomiting). 30 tablet 1   senna-docusate (SENOKOT-S) 8.6-50 MG tablet Take 2 tablets by mouth at bedtime. 60 tablet 2   Simethicone (GAS-X PO) Take 1 tablet by mouth as needed.     vitamin C (ASCORBIC ACID) 500 MG tablet Take 500 mg by mouth 2 (two) times a week.      No current facility-administered medications for this visit.    PHYSICAL EXAMINATION: ECOG PERFORMANCE STATUS: 1 - Symptomatic but completely ambulatory  Vitals:   06/25/21 0926  BP: (!) 198/86  Pulse: 60  Resp: 17  Temp: 98.7 F (37.1 C)  SpO2: 98%    Filed Weights   06/25/21 0926  Weight: 107 lb 6.4 oz (48.7 kg)   . GENERAL:alert, in no acute distress and comfortable SKIN: no acute rashes, no significant lesions EYES: conjunctiva are pink and non-injected, sclera anicteric OROPHARYNX: MMM, no exudates, no oropharyngeal erythema or ulceration NECK: supple, no JVD LYMPH:  no palpable lymphadenopathy in the cervical, axillary or inguinal regions LUNGS: clear to auscultation b/l with normal respiratory effort HEART: regular rate  & rhythm ABDOMEN:  normoactive bowel sounds , non tender, not distended. Extremity: no pedal edema PSYCH: alert & oriented x 3 with fluent speech NEURO: no focal motor/sensory deficits   LABORATORY DATA:  I have reviewed the data as listed CBC Latest Ref Rng & Units 06/25/2021 06/11/2021 06/04/2021  WBC 4.0 - 10.5 K/uL 3.6(L) 4.3 4.3  Hemoglobin 12.0 - 15.0 g/dL 11.3(L) 11.3(L) 11.5(L)  Hematocrit 36.0 - 46.0 % 34.3(L) 33.8(L) 34.7(L)  Platelets 150 - 400 K/uL 239 224 173     CMP Latest Ref Rng & Units 06/25/2021 06/11/2021 06/04/2021  Glucose 70 - 99 mg/dL 105(H) 99 101(H)  BUN 8 - 23 mg/dL _0 Creatinine 0.44 - 1.00 mg/dL 0.71 0.71 0.68  Sodium 135 - 145 mmol/L 141 141 142  Potassium 3.5 - 5.1 mmol/L 4.2 4.1 4.2  Chloride 98 - 111 mmol/L 107 107 108  CO2 22 - 32 mmol/L _1 Calcium 8.9 - 10.3 mg/dL 9.1 9.3 9.2  Total Protein 6.5 - 8.1 g/dL 6.6 6.6 6.6  Total Bilirubin 0.3 - 1.2 mg/dL 0.5 0.5 0.5  Alkaline Phos 38 - 126 U/L 31(L) 33(L) 38  AST 15 - 41 U/L 18 13(L) 12(L)  ALT 0 - 44 U/L _2 RADIOGRAPHIC STUDIES: I have personally reviewed the radiological images as listed and agreed with the findings in the report. No results found.   ASSESSMENT & PLAN:   80 yo female  1. 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  She achieved complete response with negative M protein and negative PET scan in December 2020. -She subsequently went on N[place] maintenance therapy PLAN -patient is here for f/u prior to C3D1 of Kd - no significant treatment toxicities -labs are stable  recent PET scan fromAugust 4, 2022, which showed focal activity within the right femur, consistent with active multiple myeloma, no other hypermetabolic lesion or plasmacytoma. -I also reviewed her recent bone marrow  biopsy, which showed 3% plasma cells. -Her recent M protein has slightly decreased to 0.3 -Continue Zometa every 2 months Recommended patient get her flu shot and new COVID-19 booster vaccine  2. Mild anemia -secondary to chemo   Follow-up Follow-up  as per currently scheduled carfilzomib treatments. Continue Zometa every 8 weeks  . The total time spent in the appointment was 30 minutes and more than 50% was on counseling and direct patient cares and ordering and management of chemotherapy.  Sullivan Lone MD MS Hematology/Oncology Physician [facility]

## 2021-07-02 NOTE — Patient Instructions (Signed)
[facility]ICAL [facility]COL[facility]   Discharge Instructions: Thank you for choosing [facility] to provide your oncology and hematology care.   If you have a lab appointment with the Cancer Center, please go directly to the Cancer Center and check in at the registration area.   Wear comfortable clothing and clothing appropriate for easy access to any Portacath or PICC line.   We strive to give you quality time with your provider. You may need to reschedule your appointment if you arrive late (15 or more minutes).  Arriving late affects you and other patients whose appointments are after yours.  Also, if you miss three or more appointments without notifying the office, you may be dismissed from the clinic at the provider's discretion.      For prescription refill requests, have your pharmacy contact our office and allow 72 hours for refills to be completed.    Today you received the following chemotherapy and/or immunotherapy agents: Carfilzomib (Kyprolis)     To help prevent nausea and vomiting after your treatment, we encourage you to take your nausea medication as directed.  BELOW ARE SYMPTOMS THAT SHOULD BE REPORTED IMMEDIATELY: *FEVER GREATER THAN 100.4 F (38 C) OR HIGHER *CHILLS OR SWEATING *NAUSEA AND VOMITING THAT IS NOT CONTROLLED WITH YOUR NAUSEA MEDICATION *UNUSUAL SHORTNESS OF BREATH *UNUSUAL BRUISING OR BLEEDING *URINARY PROBLEMS (pain or burning when urinating, or frequent urination) *BOWEL PROBLEMS (unusual diarrhea, constipation, pain near the anus) TENDERNESS IN MOUTH AND THROAT WITH OR WITHOUT PRESENCE OF ULCERS (sore throat, sores in mouth, or a toothache) UNUSUAL RASH, SWELLING OR PAIN  UNUSUAL VAGINAL DISCHARGE OR ITCHING   Items with * indicate a potential emergency and should be followed up as soon as possible or go to the Emergency Department if any problems should occur.  Please show the CHEMOTHERAPY ALERT CARD or IMMUNOTHERAPY ALERT CARD  at check-in to the Emergency Department and triage nurse.  Should you have questions after your visit or need to cancel or reschedule your appointment, please contact [facility]  Dept: [phone]  and follow the prompts.  Office hours are 8:00 a.m. to 4:30 p.m. Monday - Friday. Please note that voicemails left after 4:00 p.m. may not be returned until the following business day.  We are closed weekends and major holidays. You have access to a nurse at all times for urgent questions. Please call the main number to the clinic Dept: [phone] and follow the prompts.   For any non-urgent questions, you may also contact your provider using MyChart. We now offer e-Visits for anyone 18 and older to request care online for non-urgent symptoms. For details visit [email or web address].   Also download the MyChart app! Go to the app store, search "MyChart", open the app, select [place], and log in with your MyChart username and password.  Due to Covid, a mask is required upon entering the hospital/clinic. If you do not have a mask, one will be given to you upon arrival. For doctor visits, patients may have 1 support person aged 18 or older with them. For treatment visits, patients cannot have anyone with them due to current Covid guidelines and our immunocompromised population.  

## 2021-07-02 NOTE — Telephone Encounter (Signed)
Pt had labs, dr put her on levothyroxine. Is taking 70mcg, but needs to change to 15mcg. I sok ay to refill the 57mcgs? Pt contact [phone]

## 2021-07-03 NOTE — Telephone Encounter (Signed)
Rx sent 07/01/21 confirmation received levothyroxine (SYNTHROID) 88 MCG tablet 45 tablet 5 07/01/2021    Sig - Route: Take 1 tablet (88 mcg total) by mouth daily before breakfast. - Oral   Sent to pharmacy as: levothyroxine (SYNTHROID) 88 MCG tablet   E-Prescribing Status: Receipt confirmed by pharmacy (07/01/2021 12:30 PM EDT)

## 2021-07-07 LAB — MULTIPLE MYELOMA PANEL, SERUM
Albumin SerPl Elph-Mcnc: 3.9 g/dL (ref 2.9–4.4)
Albumin/Glob SerPl: 1.8 — ABNORMAL HIGH (ref 0.7–1.7)
Alpha 1: 0.2 g/dL (ref 0.0–0.4)
Alpha2 Glob SerPl Elph-Mcnc: 0.6 g/dL (ref 0.4–1.0)
B-Globulin SerPl Elph-Mcnc: 1.1 g/dL (ref 0.7–1.3)
Gamma Glob SerPl Elph-Mcnc: 0.3 g/dL — ABNORMAL LOW (ref 0.4–1.8)
Globulin, Total: 2.2 g/dL (ref 2.2–3.9)
IgA: 21 mg/dL — ABNORMAL LOW (ref 64–422)
IgG (Immunoglobin G), Serum: 695 mg/dL (ref 586–1602)
IgM (Immunoglobulin M), Srm: 21 mg/dL — ABNORMAL LOW (ref 26–217)
M Protein SerPl Elph-Mcnc: 0.5 g/dL — ABNORMAL HIGH
Total Protein ELP: 6.1 g/dL (ref 6.0–8.5)

## 2021-07-08 MED FILL — Dexamethasone Sodium Phosphate Inj 100 MG/10ML: INTRAMUSCULAR | Qty: 1 | Status: AC

## 2021-07-09 ENCOUNTER — Inpatient Hospital Stay: Payer: Medicare HMO

## 2021-07-09 ENCOUNTER — Other Ambulatory Visit: Payer: Self-pay

## 2021-07-09 ENCOUNTER — Inpatient Hospital Stay: Payer: Medicare HMO | Attending: Hematology

## 2021-07-09 VITALS — BP 181/91 | HR 71 | Temp 98.5°F | Resp 17 | Wt 104.8 lb

## 2021-07-09 DIAGNOSIS — T451X5A Adverse effect of antineoplastic and immunosuppressive drugs, initial encounter: Secondary | ICD-10-CM | POA: Insufficient documentation

## 2021-07-09 DIAGNOSIS — Z86010 Personal history of colonic polyps: Secondary | ICD-10-CM | POA: Diagnosis not present

## 2021-07-09 DIAGNOSIS — Z5111 Encounter for antineoplastic chemotherapy: Secondary | ICD-10-CM

## 2021-07-09 DIAGNOSIS — C9002 Multiple myeloma in relapse: Secondary | ICD-10-CM | POA: Diagnosis not present

## 2021-07-09 DIAGNOSIS — Z95828 Presence of other vascular implants and grafts: Secondary | ICD-10-CM

## 2021-07-09 DIAGNOSIS — M8580 Other specified disorders of bone density and structure, unspecified site: Secondary | ICD-10-CM | POA: Insufficient documentation

## 2021-07-09 DIAGNOSIS — Z5112 Encounter for antineoplastic immunotherapy: Secondary | ICD-10-CM | POA: Insufficient documentation

## 2021-07-09 DIAGNOSIS — Z7952 Long term (current) use of systemic steroids: Secondary | ICD-10-CM | POA: Diagnosis not present

## 2021-07-09 DIAGNOSIS — Z7189 Other specified counseling: Secondary | ICD-10-CM

## 2021-07-09 DIAGNOSIS — Z85850 Personal history of malignant neoplasm of thyroid: Secondary | ICD-10-CM | POA: Insufficient documentation

## 2021-07-09 DIAGNOSIS — Z7982 Long term (current) use of aspirin: Secondary | ICD-10-CM | POA: Insufficient documentation

## 2021-07-09 DIAGNOSIS — D6481 Anemia due to antineoplastic chemotherapy: Secondary | ICD-10-CM | POA: Insufficient documentation

## 2021-07-09 DIAGNOSIS — I10 Essential (primary) hypertension: Secondary | ICD-10-CM | POA: Diagnosis not present

## 2021-07-09 LAB — CMP (CANCER CENTER ONLY)
ALT: 12 U/L (ref 0–44)
AST: 17 U/L (ref 15–41)
Albumin: 3.8 g/dL (ref 3.5–5.0)
Alkaline Phosphatase: 36 U/L — ABNORMAL LOW (ref 38–126)
Anion gap: 9 (ref 5–15)
BUN: 16 mg/dL (ref 8–23)
CO2: 25 mmol/L (ref 22–32)
Calcium: 9.2 mg/dL (ref 8.9–10.3)
Chloride: 107 mmol/L (ref 98–111)
Creatinine: 0.7 mg/dL (ref 0.44–1.00)
GFR, Estimated: >60 mL/min (ref >60)
Glucose, Bld: 116 mg/dL — ABNORMAL HIGH (ref 70–99)
Potassium: 4.3 mmol/L (ref 3.5–5.1)
Sodium: 141 mmol/L (ref 135–145)
Total Bilirubin: 0.5 mg/dL (ref 0.3–1.2)
Total Protein: 6.6 g/dL (ref 6.5–8.1)

## 2021-07-09 LAB — CBC WITH DIFFERENTIAL/PLATELET
Abs Immature Granulocytes: 0.02 10*3/uL (ref 0.00–0.07)
Basophils Absolute: 0.1 10*3/uL (ref 0.0–0.1)
Basophils Relative: 1 %
Eosinophils Absolute: 0.1 10*3/uL (ref 0.0–0.5)
Eosinophils Relative: 3 %
HCT: 33.5 % — ABNORMAL LOW (ref 36.0–46.0)
Hemoglobin: 11.2 g/dL — ABNORMAL LOW (ref 12.0–15.0)
Immature Granulocytes: 1 %
Lymphocytes Relative: 12 %
Lymphs Abs: 0.5 10*3/uL — ABNORMAL LOW (ref 0.7–4.0)
MCH: 34.1 pg — ABNORMAL HIGH (ref 26.0–34.0)
MCHC: 33.4 g/dL (ref 30.0–36.0)
MCV: 102.1 fL — ABNORMAL HIGH (ref 80.0–100.0)
Monocytes Absolute: 0.6 10*3/uL (ref 0.1–1.0)
Monocytes Relative: 14 %
Neutro Abs: 3.1 10*3/uL (ref 1.7–7.7)
Neutrophils Relative %: 69 %
Platelets: 250 10*3/uL (ref 150–400)
RBC: 3.28 MIL/uL — ABNORMAL LOW (ref 3.87–5.11)
RDW: 13.9 % (ref 11.5–15.5)
WBC: 4.4 10*3/uL (ref 4.0–10.5)
nRBC: 0 % (ref 0.0–0.2)

## 2021-07-09 MED ORDER — SODIUM CHLORIDE 0.9% FLUSH
10.0000 mL | INTRAVENOUS | Status: DC | PRN
  Administered 2021-07-09: 10 mL

## 2021-07-09 MED ORDER — SODIUM CHLORIDE 0.9 % IV SOLN: Freq: Once | INTRAVENOUS | Status: AC

## 2021-07-09 MED ORDER — ONDANSETRON HCL 4 MG/2ML IJ SOLN
4.0000 mg | Freq: Once | INTRAMUSCULAR | Status: AC
  Administered 2021-07-09: 4 mg via INTRAVENOUS
  Filled 2021-07-09: qty 2

## 2021-07-09 MED ORDER — DEXTROSE 5 % IV SOLN
56.0000 mg/m2 | Freq: Once | INTRAVENOUS | Status: AC
  Administered 2021-07-09: 80 mg via INTRAVENOUS
  Filled 2021-07-09: qty 10

## 2021-07-09 MED ORDER — SODIUM CHLORIDE 0.9 % IV SOLN
10.0000 mg | Freq: Once | INTRAVENOUS | Status: AC
  Administered 2021-07-09: 10 mg via INTRAVENOUS
  Filled 2021-07-09: qty 10

## 2021-07-09 MED ORDER — SODIUM CHLORIDE 0.9% FLUSH
10.0000 mL | INTRAVENOUS | Status: AC | PRN
  Administered 2021-07-09: 10 mL

## 2021-07-09 MED ORDER — HEPARIN SOD (PORK) LOCK FLUSH 100 UNIT/ML IV SOLN
500.0000 [IU] | Freq: Once | INTRAVENOUS | Status: AC | PRN
  Administered 2021-07-09: 500 [IU]

## 2021-07-09 MED ORDER — ACETAMINOPHEN 500 MG PO TABS
1000.0000 mg | ORAL_TABLET | Freq: Once | ORAL | Status: AC
  Administered 2021-07-09: 1000 mg via ORAL
  Filled 2021-07-09: qty 2

## 2021-07-09 NOTE — Progress Notes (Signed)
Dr Irene Limbo aware of pt's elevated BP today. Pt okay for tx today.

## 2021-07-09 NOTE — Patient Instructions (Signed)
[facility]ICAL [facility]COL[facility]  Discharge Instructions: Thank you for choosing [facility] to provide your oncology and hematology care.   If you have a lab appointment with the Cancer Center, please go directly to the Cancer Center and check in at the registration area.   Wear comfortable clothing and clothing appropriate for easy access to any Portacath or PICC line.   We strive to give you quality time with your provider. You may need to reschedule your appointment if you arrive late (15 or more minutes).  Arriving late affects you and other patients whose appointments are after yours.  Also, if you miss three or more appointments without notifying the office, you may be dismissed from the clinic at the provider's discretion.      For prescription refill requests, have your pharmacy contact our office and allow 72 hours for refills to be completed.    Today you received the following chemotherapy and/or immunotherapy agents: Kyprolis    To help prevent nausea and vomiting after your treatment, we encourage you to take your nausea medication as directed.  BELOW ARE SYMPTOMS THAT SHOULD BE REPORTED IMMEDIATELY: . *FEVER GREATER THAN 100.4 F (38 C) OR HIGHER . *CHILLS OR SWEATING . *NAUSEA AND VOMITING THAT IS NOT CONTROLLED WITH YOUR NAUSEA MEDICATION . *UNUSUAL SHORTNESS OF BREATH . *UNUSUAL BRUISING OR BLEEDING . *URINARY PROBLEMS (pain or burning when urinating, or frequent urination) . *BOWEL PROBLEMS (unusual diarrhea, constipation, pain near the anus) . TENDERNESS IN MOUTH AND THROAT WITH OR WITHOUT PRESENCE OF ULCERS (sore throat, sores in mouth, or a toothache) . UNUSUAL RASH, SWELLING OR PAIN  . UNUSUAL VAGINAL DISCHARGE OR ITCHING   Items with * indicate a potential emergency and should be followed up as soon as possible or go to the Emergency Department if any problems should occur.  Please show the CHEMOTHERAPY ALERT CARD or IMMUNOTHERAPY ALERT  CARD at check-in to the Emergency Department and triage nurse.  Should you have questions after your visit or need to cancel or reschedule your appointment, please contact [facility]  Dept: [phone]  and follow the prompts.  Office hours are 8:00 a.m. to 4:30 p.m. Monday - Friday. Please note that voicemails left after 4:00 p.m. may not be returned until the following business day.  We are closed weekends and major holidays. You have access to a nurse at all times for urgent questions. Please call the main number to the clinic Dept: [phone] and follow the prompts.   For any non-urgent questions, you may also contact your provider using MyChart. We now offer e-Visits for anyone 18 and older to request care online for non-urgent symptoms. For details visit [email or web address].   Also download the MyChart app! Go to the app store, search "MyChart", open the app, select [place], and log in with your MyChart username and password.  Due to Covid, a mask is required upon entering the hospital/clinic. If you do not have a mask, one will be given to you upon arrival. For doctor visits, patients may have 1 support person aged 18 or older with them. For treatment visits, patients cannot have anyone with them due to current Covid guidelines and our immunocompromised population.   

## 2021-07-14 ENCOUNTER — Telehealth: Payer: Self-pay

## 2021-07-14 NOTE — Telephone Encounter (Signed)
Pt called regarding high blood pressure. Megan Olson stated that she is on Amlodipine and she started on chemo therapy. She stated that she is having headaches and her blood pressure has been higher than normal. Megan Olson stated that normally her readings are 125/80, 121/78, and 118/80. Megan Olson stated that at 11:45 today it was 150/90 and at 3:30 it was 164/95. Pt was transferred to triage.

## 2021-07-15 ENCOUNTER — Other Ambulatory Visit: Payer: Self-pay

## 2021-07-15 ENCOUNTER — Ambulatory Visit (INDEPENDENT_AMBULATORY_CARE_PROVIDER_SITE_OTHER): Payer: Medicare HMO | Admitting: Physician Assistant

## 2021-07-15 ENCOUNTER — Encounter: Payer: Self-pay | Admitting: Physician Assistant

## 2021-07-15 VITALS — BP 135/83 | HR 61 | Temp 97.8°F | Ht 61.0 in | Wt 107.0 lb

## 2021-07-15 DIAGNOSIS — I10 Essential (primary) hypertension: Secondary | ICD-10-CM | POA: Diagnosis not present

## 2021-07-15 NOTE — Progress Notes (Signed)
Subjective:    Patient ID: Megan Olson, female    DOB: July 20, 1941, 80 y.o.   MRN: [id number]  Chief Complaint  Patient presents with   Hypertension    Hypertension  Patient is in today for elevated blood pressure readings.  Takes amlodipine 5 mg in the evenings.   07/11/21 - 150/67  07/14/21 - 150/90, 70 bpm and she did have a headache;   07/15/21 - 160/93, 67 bpm  Denies any chest pain, SOB, weakness, headache, blurred vision, or other symptoms.  Hx of white coat syndrome in addition to her HTN.   Past Medical History:  Diagnosis Date   Anemia    Glaucoma    HYPERTENSION 03/11/2007   Multiple myeloma ([place])    OSTEOPENIA 03/11/2007   Pre-diabetes    Thyroid cancer ([place])    Thyroid disease    Tubular adenoma of colon 07/2015    Past Surgical History:  Procedure Laterality Date   BONE MARROW BIOPSY     multiple   BREAST EXCISIONAL BIOPSY Right 2000   BREAST LUMPECTOMY  1990   benign   CATARACT EXTRACTION Bilateral 2018   DILATION AND CURETTAGE OF UTERUS     bleeding at menopause. No uterine cancer   IR IMAGING GUIDED PORT INSERTION  05/16/2021   IR RADIOLOGIST EVAL & MGMT  12/13/2018   THYROIDECTOMY N/A 08/02/2020   Procedure: TOTAL THYROIDECTOMY;  Surgeon: [name], MD;  Location: [facility];  Service: General;  Laterality: N/A;   TONSILLECTOMY     age 68    Family History  Problem Relation Age of Onset   Heart disease Mother        CHF mother died 55   Arthritis Mother    Glaucoma Mother        sister as well   Alcohol abuse Father    Suicidality Father    Heart disease Sister        aortic valve replacement   Lung cancer Sister        smoker   Hyperlipidemia Brother    Hypertension Brother    COPD Brother    Colon polyps Brother    Arthritis Sister    Hypertension Sister    Glaucoma Sister    Hashimoto's thyroiditis Sister    Colon polyps Sister    Hypertension Son    Stroke Maternal Grandmother    Cystic fibrosis Niece    Colon cancer Neg  Hx     Social History   Tobacco Use   Smoking status: Former    Packs/day: 0.50    Years: 7.00    Pack years: 3.50    Types: Cigarettes    Quit date: 01/04/1961    Years since quitting: 60.5   Smokeless tobacco: Never  Vaping Use   Vaping Use: Never used  Substance Use Topics   Alcohol use: Not Currently    Alcohol/week: 1.0 standard drink    Types: 1 Standard drinks or equivalent per week    Comment: occas    Drug use: No     Allergies  Allergen Reactions   Ace Inhibitors Cough   Benadryl [Diphenhydramine] Other (See Comments)    Heart races   Diamox [Acetazolamide]     Hypotensive event at ophthalmologist   Sulfamethoxazole     REACTION: rash   Lenalidomide Rash   Penicillins Rash    REACTION: rash Did it involve swelling of the face/tongue/throat, SOB, or low BP? Yes Did it involve sudden or  severe rash/hives, skin peeling, or any reaction on the inside of your mouth or nose? No Did you need to seek medical attention at a hospital or doctor's office? No When did it last happen?      more than 10 years ago If all above answers are "NO", may proceed with cephalosporin use.     Review of Systems REFER TO HPI FOR PERTINENT POSITIVES AND NEGATIVES      Objective:     BP (!) 210/93   Pulse 61   Temp 97.8 F (36.6 C)   Ht _0  (1.549 m)   Wt 107 lb (48.5 kg)   SpO2 97%   BMI 20.22 kg/m   Wt Readings from Last 3 Encounters:  07/15/21 107 lb (48.5 kg)  07/09/21 104 lb 12 oz (47.5 kg)  07/02/21 107 lb 12.8 oz (48.9 kg)    BP Readings from Last 3 Encounters:  07/15/21 (!) 210/93  07/09/21 (!) 181/91  07/02/21 (!) 170/92     Physical Exam Vitals and nursing note reviewed.  Constitutional:      Appearance: Normal appearance. She is normal weight. She is not toxic-appearing.  HENT:     Head: Normocephalic and atraumatic.     Right Ear: External ear normal.     Left Ear: External ear normal.     Nose: Nose normal.     Mouth/Throat:     Mouth:  Mucous membranes are moist.  Eyes:     Extraocular Movements: Extraocular movements intact.     Conjunctiva/sclera: Conjunctivae normal.     Pupils: Pupils are equal, round, and reactive to light.  Cardiovascular:     Rate and Rhythm: Normal rate and regular rhythm.     Pulses: Normal pulses.     Heart sounds: Normal heart sounds.  Pulmonary:     Effort: Pulmonary effort is normal.     Breath sounds: Normal breath sounds.  Abdominal:     General: Abdomen is flat. Bowel sounds are normal.     Palpations: Abdomen is soft.  Musculoskeletal:        General: Normal range of motion.     Cervical back: Normal range of motion and neck supple.  Skin:    General: Skin is warm and dry.  Neurological:     General: No focal deficit present.     Mental Status: She is alert and oriented to person, place, and time.     Cranial Nerves: No cranial nerve deficit.     Sensory: No sensory deficit.     Motor: No weakness.     Coordination: Coordination normal.     Gait: Gait normal.     Deep Tendon Reflexes: Reflexes normal.  Psychiatric:        Mood and Affect: Mood normal.        Behavior: Behavior normal.        Thought Content: Thought content normal.        Judgment: Judgment normal.       Assessment & Plan:   Problem List Items Addressed This Visit   None Visit Diagnoses     White coat syndrome with hypertension    -  Primary       1. White coat syndrome with hypertension -Elevated readings above average for her at home -Doing well with amlodipine, will increase to 7.5 mg, and then consider 10 mg if needing additional control -Low salt diet -Plenty of fluids -She knows red flag symptoms to go to the  ED    This note was prepared with assistance of Dragon voice recognition software. Occasional wrong-word or sound-a-like substitutions may have occurred due to the inherent limitations of voice recognition software.  Time Spent: 30 minutes of total time was spent on the date of  the encounter performing the following actions: chart review prior to seeing the patient, obtaining history, performing a medically necessary exam, counseling on the treatment plan, placing orders, and documenting in our EHR.    Vidur Knust M Dandra Velardi, PA-C

## 2021-07-15 NOTE — Telephone Encounter (Signed)
Patient Name: Megan Olson Gender: Female DOB: Aug 01, 1941 Age: 80 Y 78 M 24 D Return Phone Number: [phone] (Primary), [phone] (Secondary) Address: City/ State/ Zip: [place]  [address] Client [facility] Garret Reddish- MD Contact Type Call Who Is Calling Patient / Member / Family / Caregiver Call Type Triage / Clinical Relationship To Patient Self Return Phone Number [phone] (Primary) Chief Complaint Headache Reason for Call Symptomatic / Request for [place] states her blood pressure is 164/95 and she is having headaches. She is also taking chemo. Translation No Nurse Assessment Nurse: Toribio Harbour, RN, Joelene Millin Date/Time (Eastern Time): 07/14/2021 4:19:03 PM Confirm and document reason for call. If symptomatic, describe symptoms. ---Caller states her blood pressure is 164/95 and she is having headaches. She is also taking chemo. She states her oncologist does not believe the elevated blood pressure is not due to the chemo. She does not have a headache now. Does the patient have any new or worsening symptoms? ---Yes Will a triage be completed? ---Yes Related visit to physician within the last 2 weeks? ---Yes Does the PT have any chronic conditions? (i.e. diabetes, asthma, this includes High risk factors for pregnancy, etc.) ---Yes List chronic conditions. ---Multiple myeloma, hypertension Is this a behavioral health or substance abuse call? ---No Guidelines Guideline Title Affirmed Question Affirmed Notes Nurse Date/Time (Eastern Time) Blood Pressure - High Systolic BP >= 989 OR Diastolic >= 211 Daves, RN, Kimberly 07/14/2021 4:21:05 PM Disp. Time Eilene Ghazi Time) Disposition Final User 07/14/2021 4:25:42 PM SEE PCP WITHIN 3 DAYS Yes Daves, RN, Joelene Millin PLEASE NOTE: All timestamps contained within this report  are represented as [place] Standard Time. CONFIDENTIALTY NOTICE: This fax transmission is intended only for the addressee. It contains information that is legally privileged, confidential or otherwise protected from use or disclosure. If you are not the intended recipient, you are strictly prohibited from reviewing, disclosing, copying using or disseminating any of this information or taking any action in reliance on or regarding this information. If you have received this fax in error, please notify us immediately by telephone so that we can arrange for its return to [place]. Phone: [phone], Toll-Free: [phone], Fax: [phone] Page: 2 of 2 Call Id: [id number] [place] Disagree/Comply Comply Caller Understands Yes PreDisposition Call Doctor Care Advice Given Per Guideline SEE PCP WITHIN 3 DAYS: * You need to be seen within 2 or 3 days. CALL BACK IF: * Weakness or numbness of the face, arm or leg on one side of the body occurs * Difficulty walking, difficulty talking, or severe headache occurs * Your blood pressure is over 180/110 * Chest pain or difficulty breathing occurs * You become worse CARE ADVICE given per High Blood Pressure (Adult) guideline. * EAT HEALTHY: Eat a diet rich in fresh fruits and vegetables, dietary fiber, non-animal protein (e.g., soy), and low-fat dairy products. Avoid foods with a high content of saturated fat or cholesterol. * DECREASE SODIUM INTAKE: Aim to eat less than 2.4 g (100 mmol) of sodium each day. Unfortunately 75% of the salt in the average person's diet is in pre-processed foods. * LIMIT ALCOHOL: Limit alcoholic beverages to no more than one drink per day for women and no more than two drinks for men. A drink is 1.5 oz hard liquor (one shot or jigger; 45 ml), 5 oz wine (small glass; 150 ml), 12 oz beer (one can; 360 ml). *  EXERCISE, BE MORE PHYSICALLY ACTIVE: Do at least 30 minutes of aerobic exercise (e.g., brisk walking) most days of the week.  Other examples of aerobic activities cycling, jogging, and swimming. * REDUCE STRESS: Find activities that help reduce your stress. Examples might include meditation, yoga, or even a restful walk in a park. Referrals REFERRED TO PCP OFFICE

## 2021-07-15 NOTE — Telephone Encounter (Signed)
Patient was seen by Alyssa today.

## 2021-07-15 NOTE — Patient Instructions (Signed)
Increase amlodipine to 7.5 mg daily at bedtime. Monitor readings at home. Send MyChart message update on Monday 07/21/21.  IN THE EVENT OF ANY SUDDEN WORST HEADACHE OF LIFE, WEAKNESS ON ONE SIDE, SLURRED SPEECH, CHEST PAIN, OR OTHER ACUTE SYMPTOMS, CALL 911 OR GO STRAIGHT TO THE ED.

## 2021-07-21 ENCOUNTER — Encounter: Payer: Self-pay | Admitting: Family Medicine

## 2021-07-21 ENCOUNTER — Other Ambulatory Visit: Payer: Self-pay

## 2021-07-21 MED ORDER — AMLODIPINE BESYLATE 5 MG PO TABS: 5.0000 mg | ORAL_TABLET | Freq: Every day | ORAL | 3 refills | Status: DC

## 2021-07-21 MED ORDER — AMLODIPINE BESYLATE 2.5 MG PO TABS: 2.5000 mg | ORAL_TABLET | Freq: Every day | ORAL | 3 refills | Status: DC

## 2021-07-23 ENCOUNTER — Ambulatory Visit: Payer: Medicare HMO

## 2021-07-23 ENCOUNTER — Inpatient Hospital Stay: Payer: Medicare HMO

## 2021-07-23 ENCOUNTER — Inpatient Hospital Stay: Payer: Medicare HMO | Admitting: Hematology

## 2021-07-23 ENCOUNTER — Other Ambulatory Visit: Payer: Self-pay

## 2021-07-23 VITALS — BP 172/82 | HR 64 | Temp 98.0°F | Resp 16 | Ht 61.0 in | Wt 107.7 lb

## 2021-07-23 VITALS — BP 158/78

## 2021-07-23 DIAGNOSIS — C9000 Multiple myeloma not having achieved remission: Secondary | ICD-10-CM

## 2021-07-23 DIAGNOSIS — I10 Essential (primary) hypertension: Secondary | ICD-10-CM | POA: Diagnosis not present

## 2021-07-23 DIAGNOSIS — Z5112 Encounter for antineoplastic immunotherapy: Secondary | ICD-10-CM | POA: Diagnosis not present

## 2021-07-23 DIAGNOSIS — Z86010 Personal history of colonic polyps: Secondary | ICD-10-CM | POA: Diagnosis not present

## 2021-07-23 DIAGNOSIS — Z85850 Personal history of malignant neoplasm of thyroid: Secondary | ICD-10-CM | POA: Diagnosis not present

## 2021-07-23 DIAGNOSIS — Z7189 Other specified counseling: Secondary | ICD-10-CM

## 2021-07-23 DIAGNOSIS — C9002 Multiple myeloma in relapse: Secondary | ICD-10-CM

## 2021-07-23 DIAGNOSIS — Z7952 Long term (current) use of systemic steroids: Secondary | ICD-10-CM | POA: Diagnosis not present

## 2021-07-23 DIAGNOSIS — Z5111 Encounter for antineoplastic chemotherapy: Secondary | ICD-10-CM

## 2021-07-23 DIAGNOSIS — M8580 Other specified disorders of bone density and structure, unspecified site: Secondary | ICD-10-CM | POA: Diagnosis not present

## 2021-07-23 DIAGNOSIS — D6481 Anemia due to antineoplastic chemotherapy: Secondary | ICD-10-CM | POA: Diagnosis not present

## 2021-07-23 DIAGNOSIS — Z7982 Long term (current) use of aspirin: Secondary | ICD-10-CM | POA: Diagnosis not present

## 2021-07-23 DIAGNOSIS — T451X5A Adverse effect of antineoplastic and immunosuppressive drugs, initial encounter: Secondary | ICD-10-CM | POA: Diagnosis not present

## 2021-07-23 LAB — CMP (CANCER CENTER ONLY)
ALT: 12 U/L (ref 0–44)
AST: 15 U/L (ref 15–41)
Albumin: 3.9 g/dL (ref 3.5–5.0)
Alkaline Phosphatase: 32 U/L — ABNORMAL LOW (ref 38–126)
Anion gap: 8 (ref 5–15)
BUN: 14 mg/dL (ref 8–23)
CO2: 24 mmol/L (ref 22–32)
Calcium: 9.2 mg/dL (ref 8.9–10.3)
Chloride: 108 mmol/L (ref 98–111)
Creatinine: 0.66 mg/dL (ref 0.44–1.00)
GFR, Estimated: >60 mL/min (ref >60)
Glucose, Bld: 106 mg/dL — ABNORMAL HIGH (ref 70–99)
Potassium: 4.2 mmol/L (ref 3.5–5.1)
Sodium: 140 mmol/L (ref 135–145)
Total Bilirubin: 0.4 mg/dL (ref 0.3–1.2)
Total Protein: 6.5 g/dL (ref 6.5–8.1)

## 2021-07-23 LAB — CBC WITH DIFFERENTIAL/PLATELET
Abs Immature Granulocytes: 0.02 10*3/uL (ref 0.00–0.07)
Basophils Absolute: 0.1 10*3/uL (ref 0.0–0.1)
Basophils Relative: 2 %
Eosinophils Absolute: 0.2 10*3/uL (ref 0.0–0.5)
Eosinophils Relative: 4 %
HCT: 33.1 % — ABNORMAL LOW (ref 36.0–46.0)
Hemoglobin: 11.1 g/dL — ABNORMAL LOW (ref 12.0–15.0)
Immature Granulocytes: 1 %
Lymphocytes Relative: 10 %
Lymphs Abs: 0.4 10*3/uL — ABNORMAL LOW (ref 0.7–4.0)
MCH: 34.2 pg — ABNORMAL HIGH (ref 26.0–34.0)
MCHC: 33.5 g/dL (ref 30.0–36.0)
MCV: 101.8 fL — ABNORMAL HIGH (ref 80.0–100.0)
Monocytes Absolute: 0.5 10*3/uL (ref 0.1–1.0)
Monocytes Relative: 11 %
Neutro Abs: 3.1 10*3/uL (ref 1.7–7.7)
Neutrophils Relative %: 72 %
Platelets: 273 10*3/uL (ref 150–400)
RBC: 3.25 MIL/uL — ABNORMAL LOW (ref 3.87–5.11)
RDW: 14.2 % (ref 11.5–15.5)
WBC: 4.2 10*3/uL (ref 4.0–10.5)
nRBC: 0 % (ref 0.0–0.2)

## 2021-07-23 MED ORDER — ONDANSETRON HCL 4 MG/2ML IJ SOLN
4.0000 mg | Freq: Once | INTRAMUSCULAR | Status: AC
  Administered 2021-07-23: 4 mg via INTRAVENOUS
  Filled 2021-07-23: qty 2

## 2021-07-23 MED ORDER — DEXTROSE 5 % IV SOLN
56.0000 mg/m2 | Freq: Once | INTRAVENOUS | Status: AC
  Administered 2021-07-23: 80 mg via INTRAVENOUS
  Filled 2021-07-23: qty 30

## 2021-07-23 MED ORDER — SODIUM CHLORIDE 0.9 % IV SOLN: Freq: Once | INTRAVENOUS | Status: AC

## 2021-07-23 MED ORDER — ZOLEDRONIC ACID 4 MG/100ML IV SOLN
4.0000 mg | Freq: Once | INTRAVENOUS | Status: AC
  Administered 2021-07-23: 4 mg via INTRAVENOUS
  Filled 2021-07-23: qty 100

## 2021-07-23 MED ORDER — SODIUM CHLORIDE 0.9 % IV SOLN: Freq: Once | INTRAVENOUS | Status: DC

## 2021-07-23 MED ORDER — SODIUM CHLORIDE 0.9% FLUSH
10.0000 mL | Freq: Once | INTRAVENOUS | Status: AC | PRN
  Administered 2021-07-23: 10 mL

## 2021-07-23 MED ORDER — ACETAMINOPHEN 500 MG PO TABS
1000.0000 mg | ORAL_TABLET | Freq: Once | ORAL | Status: AC
  Administered 2021-07-23: 1000 mg via ORAL
  Filled 2021-07-23: qty 2

## 2021-07-23 MED ORDER — SODIUM CHLORIDE 0.9 % IV SOLN
10.0000 mg | Freq: Once | INTRAVENOUS | Status: AC
  Administered 2021-07-23: 10 mg via INTRAVENOUS
  Filled 2021-07-23: qty 10

## 2021-07-24 ENCOUNTER — Telehealth: Payer: Self-pay | Admitting: Hematology

## 2021-07-24 ENCOUNTER — Encounter: Payer: Self-pay | Admitting: Hematology

## 2021-07-24 MED ORDER — FENTANYL 12 MCG/HR TD PT72: 1.0000 | MEDICATED_PATCH | TRANSDERMAL | 0 refills | Status: DC

## 2021-07-24 NOTE — Telephone Encounter (Signed)
Scheduled follow-up appointments per 10/19 los. Patient is aware. 

## 2021-07-25 ENCOUNTER — Other Ambulatory Visit: Payer: Self-pay

## 2021-07-25 DIAGNOSIS — C9000 Multiple myeloma not having achieved remission: Secondary | ICD-10-CM

## 2021-07-25 MED ORDER — OXYCODONE-ACETAMINOPHEN 5-325 MG PO TABS: 1.0000 | ORAL_TABLET | ORAL | 0 refills | Status: DC | PRN

## 2021-07-29 ENCOUNTER — Encounter: Payer: Self-pay | Admitting: Hematology

## 2021-07-29 MED FILL — Dexamethasone Sodium Phosphate Inj 100 MG/10ML: INTRAMUSCULAR | Qty: 1 | Status: AC

## 2021-07-29 NOTE — Progress Notes (Addendum)
[address]   [phone] [phone]   Clinic Follow up Note   Patient Care Team: [name], MD as PCP - General (Family Medicine) [name], MD as Consulting Physician (Hematology) Bond, [name], MD as Referring Physician (Ophthalmology) [name], [name], MD as Consulting Physician (Hematology and Oncology) [name], MD as Consulting Physician (Internal Medicine) [name], MD as Consulting Physician (General Surgery) 07/31/2021  CHIEF COMPLAINT: f/u for continued management of multiple myeloma  SUMMARY OF ONCOLOGIC HISTORY: Oncology History  Multiple myeloma not having achieved remission ([place])  12/12/2018 Initial Diagnosis   Multiple myeloma not having achieved remission ([place])   12/20/2018 - 10/04/2019 Chemotherapy   The patient had dexamethasone (DECADRON) tablet 20 mg, 20 mg (100 % of original dose 20 mg), Oral, Once, 14 of 14 cycles Dose modification: 20 mg (original dose 20 mg, Cycle 1), 10 mg (original dose 20 mg, Cycle 14) Administration: 20 mg (12/20/2018), 20 mg (12/27/2018), 20 mg (01/10/2019), 20 mg (01/17/2019), 20 mg (01/31/2019), 20 mg (02/07/2019), 20 mg (03/14/2019), 20 mg (03/21/2019), 20 mg (02/21/2019), 20 mg (02/28/2019), 20 mg (04/04/2019), 20 mg (04/11/2019), 20 mg (04/25/2019), 20 mg (05/02/2019), 20 mg (05/16/2019), 20 mg (05/23/2019), 20 mg (06/06/2019), 20 mg (06/13/2019), 20 mg (06/27/2019), 20 mg (07/04/2019), 20 mg (07/18/2019), 20 mg (07/25/2019), 20 mg (08/08/2019), 20 mg (08/15/2019), 20 mg (08/29/2019), 20 mg (09/05/2019), 10 mg (09/19/2019) lenalidomide (REVLIMID) 15 MG capsule, 1 of 1 cycle, Start date: 01/18/2019, End date: 02/21/2019 bortezomib SQ (VELCADE) chemo injection 2 mg, 1.3 mg/m2 = 2 mg, Subcutaneous,  Once, 14 of 14 cycles Administration: 2 mg (12/20/2018), 2 mg (12/27/2018), 2 mg (12/23/2018), 2 mg (12/30/2018), 2 mg (01/10/2019), 2 mg (01/17/2019), 2 mg (01/13/2019), 2 mg (01/20/2019), 2 mg  (01/31/2019), 2 mg (02/07/2019), 2 mg (02/03/2019), 2 mg (02/10/2019), 2 mg (03/14/2019), 2 mg (03/17/2019), 2 mg (03/21/2019), 2 mg (03/24/2019), 2 mg (02/21/2019), 2 mg (02/24/2019), 2 mg (02/28/2019), 2 mg (03/03/2019), 2 mg (04/04/2019), 2 mg (04/06/2019), 2 mg (04/11/2019), 2 mg (04/14/2019), 2 mg (04/25/2019), 2 mg (04/28/2019), 2 mg (05/02/2019), 2 mg (05/05/2019), 2 mg (05/16/2019), 2 mg (05/19/2019), 2 mg (05/23/2019), 2 mg (05/26/2019), 2 mg (06/06/2019), 2 mg (06/09/2019), 2 mg (06/13/2019), 2 mg (06/16/2019), 2 mg (06/27/2019), 2 mg (06/30/2019), 2 mg (07/04/2019), 2 mg (07/07/2019), 2 mg (07/18/2019), 2 mg (07/21/2019), 2 mg (07/25/2019), 2 mg (07/28/2019), 2 mg (08/08/2019), 2 mg (08/11/2019), 2 mg (08/15/2019), 2 mg (08/18/2019), 2 mg (08/29/2019), 2 mg (09/01/2019), 2 mg (09/05/2019), 2 mg (09/08/2019), 2 mg (09/19/2019), 2 mg (10/04/2019)   for chemotherapy treatment.     Multiple myeloma in relapse ([place])  04/16/2021 Initial Diagnosis   Multiple myeloma in relapse ([place])   04/30/2021 -  Chemotherapy   Patient is on Treatment Plan : MYELOMA RELAPSED/REFRACTORY KCd q28d       CURRENT THERAPY: Kyprolis and dexa started on 04/30/2021  INTERVAL HISTORY:  Ms Paquette returns for f/u for continued management of her multiple myeloma on with cycle 4-day 1 of carfilzomib dexamethasone treatment . She notes no acute new symptoms  She has been tolerating carfilzomib without any acute toxicities.  No fevers no chills no night sweats no unexpected weight loss. Her existing pains from her previous healing bone lesions has been well controlled with the fentanyl patch and as needed oxycodone which we shall continue . No other acute new symptoms  MEDICAL HISTORY:  Past Medical History:  Diagnosis Date   Anemia    Glaucoma  HYPERTENSION 03/11/2007   Multiple myeloma ([place])    OSTEOPENIA 03/11/2007   Pre-diabetes    Thyroid cancer ([place])    Thyroid disease    Tubular adenoma of colon 07/2015    SURGICAL HISTORY: Past Surgical History:   Procedure Laterality Date   BONE MARROW BIOPSY     multiple   BREAST EXCISIONAL BIOPSY Right 2000   BREAST LUMPECTOMY  1990   benign   CATARACT EXTRACTION Bilateral 2018   DILATION AND CURETTAGE OF UTERUS     bleeding at menopause. No uterine cancer   IR IMAGING GUIDED PORT INSERTION  05/16/2021   IR RADIOLOGIST EVAL & MGMT  12/13/2018   THYROIDECTOMY N/A 08/02/2020   Procedure: TOTAL THYROIDECTOMY;  Surgeon: [name], MD;  Location: [facility];  Service: General;  Laterality: N/A;   TONSILLECTOMY     age 76    I have reviewed the social history and family history with the patient and they are unchanged from previous note.  ALLERGIES:  is allergic to ace inhibitors, benadryl [diphenhydramine], diamox [acetazolamide], sulfamethoxazole, lenalidomide, and penicillins.  MEDICATIONS:  Current Outpatient Medications  Medication Sig Dispense Refill   acyclovir (ZOVIRAX) 400 MG tablet Take 1 tablet (400 mg total) by mouth 2 (two) times daily. 60 tablet 3   ALPHAGAN P 0.1 % SOLN Place 1 drop into both eyes in the morning, at noon, and at bedtime.      amLODipine (NORVASC) 2.5 MG tablet Take 1 tablet (2.5 mg total) by mouth daily. Take along with $RemoveBe'5mg'JwcoedQch$  tablet. 90 tablet 3   amLODipine (NORVASC) 5 MG tablet Take 1 tablet (5 mg total) by mouth at bedtime. 90 tablet 3   amLODipine (NORVASC) 5 MG tablet Take 1 tablet (5 mg total) by mouth daily. Take along with 2.$RemoveBefo'5mg'oalMIEbBXLo$  tablet. 90 tablet 3   aspirin EC 81 MG tablet Take 81 mg by mouth daily.     bimatoprost (LUMIGAN) 0.03 % ophthalmic solution Place 1 drop into both eyes at bedtime.      Cholecalciferol (VITAMIN D3) 125 MCG (5000 UT) TABS Take 5,000 Units by mouth daily.     Co-Enzyme Q-10 100 MG CAPS Take 100 mg by mouth daily.     dexamethasone (DECADRON) 4 MG tablet Take 5 tablets ($RemoveBe'20mg'ZLqixFcZi$ ) on day 22 of each cycle. Repeat every 28 days.  Take with breakfast. 12 tablet 3   dorzolamide-timolol (COSOPT) 22.3-6.8 MG/ML ophthalmic solution Place 1 drop  into both eyes 2 (two) times daily.   11   fentaNYL (DURAGESIC) 12 MCG/HR Place 1 patch onto the skin every 3 (three) days. 10 patch 0   hydrOXYzine (ATARAX/VISTARIL) 25 MG tablet Take 1 tablet (25 mg total) by mouth 3 (three) times daily as needed. 30 tablet 0   levothyroxine (SYNTHROID) 88 MCG tablet Take 1 tablet (88 mcg total) by mouth daily before breakfast. 45 tablet 5   lidocaine-prilocaine (EMLA) cream Apply 1 application topically as needed. Apply prior to port access. 30 g 0   magnesium chloride (SLOW-MAG) 64 MG TBEC SR tablet Take by mouth.     Multiple Vitamins-Minerals (MULTIVITAMIN WITH MINERALS) tablet Take 1 tablet by mouth daily. Centrum Silver 50 +     Omega-3 1000 MG CAPS Take 1,000 mg by mouth daily.      ondansetron (ZOFRAN) 8 MG tablet Take 8 mg by mouth 30 to 60 min prior to Cytoxan administration then take 8 mg twice daily as needed for nausea and vomiting. 30 tablet 1   oxyCODONE-acetaminophen (PERCOCET)  5-325 MG tablet Take 1-2 tablets by mouth every 4 (four) hours as needed for moderate pain or severe pain. 90 tablet 0   polyethylene glycol (MIRALAX) packet Take 17 g by mouth daily. 30 each 1   prochlorperazine (COMPAZINE) 10 MG tablet Take 1 tablet (10 mg total) by mouth every 6 (six) hours as needed (Nausea or vomiting). 30 tablet 1   senna-docusate (SENOKOT-S) 8.6-50 MG tablet Take 2 tablets by mouth at bedtime. 60 tablet 2   Simethicone (GAS-X PO) Take 1 tablet by mouth as needed.     vitamin C (ASCORBIC ACID) 500 MG tablet Take 500 mg by mouth 2 (two) times a week.      No current facility-administered medications for this visit.    PHYSICAL EXAMINATION: ECOG PERFORMANCE STATUS: 1 - Symptomatic but completely ambulatory  Vitals:   07/23/21 1025  BP: (!) 172/82  Pulse: 64  Resp: 16  Temp: 98 F (36.7 C)  SpO2: 99%    Filed Weights   07/23/21 1025  Weight: 107 lb 11.2 oz (48.9 kg)  . GENERAL:alert, in no acute distress and comfortable SKIN: no acute  rashes, no significant lesions EYES: conjunctiva are pink and non-injected, sclera anicteric OROPHARYNX: MMM, no exudates, no oropharyngeal erythema or ulceration NECK: supple, no JVD LYMPH:  no palpable lymphadenopathy in the cervical, axillary or inguinal regions LUNGS: clear to auscultation b/l with normal respiratory effort HEART: regular rate & rhythm ABDOMEN:  normoactive bowel sounds , non tender, not distended. Extremity: no pedal edema PSYCH: alert & oriented x 3 with fluent speech NEURO: no focal motor/sensory deficits  LABORATORY DATA:  I have reviewed the data as listed CBC Latest Ref Rng & Units 07/30/2021 07/23/2021 07/09/2021  WBC 4.0 - 10.5 K/uL 4.3 4.2 4.4  Hemoglobin 12.0 - 15.0 g/dL 10.5(L) 11.1(L) 11.2(L)  Hematocrit 36.0 - 46.0 % 32.0(L) 33.1(L) 33.5(L)  Platelets 150 - 400 K/uL 172 273 250   CMP Latest Ref Rng & Units 07/30/2021 07/23/2021 07/09/2021  Glucose 70 - 99 mg/dL 99 106(H) 116(H)  BUN 8 - 23 mg/dL $Remove'13 14 16  'YMsfcye$ Creatinine 0.44 - 1.00 mg/dL 0.63 0.66 0.70  Sodium 135 - 145 mmol/L 140 140 141  Potassium 3.5 - 5.1 mmol/L 4.3 4.2 4.3  Chloride 98 - 111 mmol/L 107 108 107  CO2 22 - 32 mmol/L $RemoveB'24 24 25  'UlElFucR$ Calcium 8.9 - 10.3 mg/dL 9.3 9.2 9.2  Total Protein 6.5 - 8.1 g/dL 6.4(L) 6.5 6.6  Total Bilirubin 0.3 - 1.2 mg/dL 0.5 0.4 0.5  Alkaline Phos 38 - 126 U/L 38 32(L) 36(L)  AST 15 - 41 U/L 13(L) 15 17  ALT 0 - 44 U/L $Remo'11 12 12      'TjykA$ RADIOGRAPHIC STUDIES: I have personally reviewed the radiological images as listed and agreed with the findings in the report. No results found.   ASSESSMENT & PLAN:   80 yo female  1. 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  She achieved complete response with negative M protein and negative PET scan in December 2020. -She subsequently went on Ninlaro  maintenance therapy  PET scan fromAugust 4, 2022, which showed focal activity within the right femur, consistent with active multiple myeloma, no other hypermetabolic lesion or plasmacytoma.  05/06/2021 bone marrow biopsy, which showed 3% plasma cells. PLAN -patient is here for  f/u prior to C4D1 of Kd - no significant treatment toxicities -labs mild anemia at 11.1 with normal WBC count and platelets CMP unremarkable Myeloma panel done on 06/24/2021 showed an M spike of 0.5 g/dL slightly up from the previous level of 0.3 g/dL (she has previously had M spike of 0.6g/dl)..  We shall repeat her myeloma panel with cycle 4-day 15 to monitor this. -Discussed with the patient that if her M spike continues to worsen we might have to add an additional medication to her treatment. -Continue Zometa every 2 months  2. Mild anemia -secondary to chemo   Follow-up Please schedule next 2 cycles of carfilzomib as per orders Port flush and labs with each treatment Continue Zometa every 8 weeks please schedule next 3 treatments MD visit in 4 weeks  . The total time spent in the appointment was 30 minutes and more than 50% was on counseling and direct patient cares.   Sullivan Lone MD MS Hematology/Oncology Physician [facility]  .

## 2021-07-30 ENCOUNTER — Other Ambulatory Visit: Payer: Self-pay

## 2021-07-30 ENCOUNTER — Inpatient Hospital Stay: Payer: Medicare HMO

## 2021-07-30 VITALS — BP 163/77 | HR 69 | Temp 98.5°F | Resp 18

## 2021-07-30 DIAGNOSIS — C9002 Multiple myeloma in relapse: Secondary | ICD-10-CM | POA: Diagnosis not present

## 2021-07-30 DIAGNOSIS — Z5111 Encounter for antineoplastic chemotherapy: Secondary | ICD-10-CM

## 2021-07-30 DIAGNOSIS — Z5112 Encounter for antineoplastic immunotherapy: Secondary | ICD-10-CM | POA: Diagnosis not present

## 2021-07-30 DIAGNOSIS — Z95828 Presence of other vascular implants and grafts: Secondary | ICD-10-CM

## 2021-07-30 DIAGNOSIS — T451X5A Adverse effect of antineoplastic and immunosuppressive drugs, initial encounter: Secondary | ICD-10-CM | POA: Diagnosis not present

## 2021-07-30 DIAGNOSIS — D6481 Anemia due to antineoplastic chemotherapy: Secondary | ICD-10-CM | POA: Diagnosis not present

## 2021-07-30 DIAGNOSIS — Z7982 Long term (current) use of aspirin: Secondary | ICD-10-CM | POA: Diagnosis not present

## 2021-07-30 DIAGNOSIS — Z7952 Long term (current) use of systemic steroids: Secondary | ICD-10-CM | POA: Diagnosis not present

## 2021-07-30 DIAGNOSIS — I10 Essential (primary) hypertension: Secondary | ICD-10-CM | POA: Diagnosis not present

## 2021-07-30 DIAGNOSIS — Z85850 Personal history of malignant neoplasm of thyroid: Secondary | ICD-10-CM | POA: Diagnosis not present

## 2021-07-30 DIAGNOSIS — Z7189 Other specified counseling: Secondary | ICD-10-CM

## 2021-07-30 DIAGNOSIS — M8580 Other specified disorders of bone density and structure, unspecified site: Secondary | ICD-10-CM | POA: Diagnosis not present

## 2021-07-30 DIAGNOSIS — Z86010 Personal history of colonic polyps: Secondary | ICD-10-CM | POA: Diagnosis not present

## 2021-07-30 LAB — CBC WITH DIFFERENTIAL/PLATELET
Abs Immature Granulocytes: 0.02 10*3/uL (ref 0.00–0.07)
Basophils Absolute: 0 10*3/uL (ref 0.0–0.1)
Basophils Relative: 1 %
Eosinophils Absolute: 0.3 10*3/uL (ref 0.0–0.5)
Eosinophils Relative: 8 %
HCT: 32 % — ABNORMAL LOW (ref 36.0–46.0)
Hemoglobin: 10.5 g/dL — ABNORMAL LOW (ref 12.0–15.0)
Immature Granulocytes: 1 %
Lymphocytes Relative: 10 %
Lymphs Abs: 0.4 10*3/uL — ABNORMAL LOW (ref 0.7–4.0)
MCH: 34.2 pg — ABNORMAL HIGH (ref 26.0–34.0)
MCHC: 32.8 g/dL (ref 30.0–36.0)
MCV: 104.2 fL — ABNORMAL HIGH (ref 80.0–100.0)
Monocytes Absolute: 0.8 10*3/uL (ref 0.1–1.0)
Monocytes Relative: 19 %
Neutro Abs: 2.7 10*3/uL (ref 1.7–7.7)
Neutrophils Relative %: 61 %
Platelets: 172 10*3/uL (ref 150–400)
RBC: 3.07 MIL/uL — ABNORMAL LOW (ref 3.87–5.11)
RDW: 14.1 % (ref 11.5–15.5)
WBC: 4.3 10*3/uL (ref 4.0–10.5)
nRBC: 0 % (ref 0.0–0.2)

## 2021-07-30 LAB — CMP (CANCER CENTER ONLY)
ALT: 11 U/L (ref 0–44)
AST: 13 U/L — ABNORMAL LOW (ref 15–41)
Albumin: 3.6 g/dL (ref 3.5–5.0)
Alkaline Phosphatase: 38 U/L (ref 38–126)
Anion gap: 9 (ref 5–15)
BUN: 13 mg/dL (ref 8–23)
CO2: 24 mmol/L (ref 22–32)
Calcium: 9.3 mg/dL (ref 8.9–10.3)
Chloride: 107 mmol/L (ref 98–111)
Creatinine: 0.63 mg/dL (ref 0.44–1.00)
GFR, Estimated: >60 mL/min (ref >60)
Glucose, Bld: 99 mg/dL (ref 70–99)
Potassium: 4.3 mmol/L (ref 3.5–5.1)
Sodium: 140 mmol/L (ref 135–145)
Total Bilirubin: 0.5 mg/dL (ref 0.3–1.2)
Total Protein: 6.4 g/dL — ABNORMAL LOW (ref 6.5–8.1)

## 2021-07-30 MED ORDER — SODIUM CHLORIDE 0.9% FLUSH
10.0000 mL | INTRAVENOUS | Status: AC | PRN
  Administered 2021-07-30: 10 mL

## 2021-07-30 MED ORDER — SODIUM CHLORIDE 0.9% FLUSH
10.0000 mL | INTRAVENOUS | Status: DC | PRN
  Administered 2021-07-30: 10 mL

## 2021-07-30 MED ORDER — ONDANSETRON HCL 4 MG/2ML IJ SOLN
4.0000 mg | Freq: Once | INTRAMUSCULAR | Status: AC
  Administered 2021-07-30: 4 mg via INTRAVENOUS
  Filled 2021-07-30: qty 2

## 2021-07-30 MED ORDER — ACETAMINOPHEN 500 MG PO TABS
1000.0000 mg | ORAL_TABLET | Freq: Once | ORAL | Status: AC
  Administered 2021-07-30: 1000 mg via ORAL
  Filled 2021-07-30: qty 2

## 2021-07-30 MED ORDER — SODIUM CHLORIDE 0.9 % IV SOLN
10.0000 mg | Freq: Once | INTRAVENOUS | Status: AC
  Administered 2021-07-30: 10 mg via INTRAVENOUS
  Filled 2021-07-30: qty 10

## 2021-07-30 MED ORDER — DEXTROSE 5 % IV SOLN
56.0000 mg/m2 | Freq: Once | INTRAVENOUS | Status: AC
  Administered 2021-07-30: 80 mg via INTRAVENOUS
  Filled 2021-07-30: qty 30

## 2021-07-30 MED ORDER — SODIUM CHLORIDE 0.9 % IV SOLN
Freq: Once | INTRAVENOUS | Status: AC
Start: 2021-07-30 — End: 2021-07-30

## 2021-07-30 MED ORDER — SODIUM CHLORIDE 0.9 % IV SOLN: Freq: Once | INTRAVENOUS | Status: AC

## 2021-07-30 MED ORDER — HEPARIN SOD (PORK) LOCK FLUSH 100 UNIT/ML IV SOLN
500.0000 [IU] | Freq: Once | INTRAVENOUS | Status: AC | PRN
  Administered 2021-07-30: 500 [IU]

## 2021-07-30 NOTE — Patient Instructions (Signed)
[facility] MEDICAL [facility]COL[facility]  Discharge Instructions: Thank you for choosing [facility] to provide your oncology and hematology care.   If you have a lab appointment with the Cancer Center, please go directly to the Cancer Center and check in at the registration area.   Wear comfortable clothing and clothing appropriate for easy access to any Portacath or PICC line.   We strive to give you quality time with your provider. You may need to reschedule your appointment if you arrive late (15 or more minutes).  Arriving late affects you and other patients whose appointments are after yours.  Also, if you miss three or more appointments without notifying the office, you may be dismissed from the clinic at the provider's discretion.      For prescription refill requests, have your pharmacy contact our office and allow 72 hours for refills to be completed.    Today you received the following chemotherapy and/or immunotherapy agents: Kyprolis    To help prevent nausea and vomiting after your treatment, we encourage you to take your nausea medication as directed.  BELOW ARE SYMPTOMS THAT SHOULD BE REPORTED IMMEDIATELY: . *FEVER GREATER THAN 100.4 F (38 C) OR HIGHER . *CHILLS OR SWEATING . *NAUSEA AND VOMITING THAT IS NOT CONTROLLED WITH YOUR NAUSEA MEDICATION . *UNUSUAL SHORTNESS OF BREATH . *UNUSUAL BRUISING OR BLEEDING . *URINARY PROBLEMS (pain or burning when urinating, or frequent urination) . *BOWEL PROBLEMS (unusual diarrhea, constipation, pain near the anus) . TENDERNESS IN MOUTH AND THROAT WITH OR WITHOUT PRESENCE OF ULCERS (sore throat, sores in mouth, or a toothache) . UNUSUAL RASH, SWELLING OR PAIN  . UNUSUAL VAGINAL DISCHARGE OR ITCHING   Items with * indicate a potential emergency and should be followed up as soon as possible or go to the Emergency Department if any problems should occur.  Please show the CHEMOTHERAPY ALERT CARD or IMMUNOTHERAPY ALERT  CARD at check-in to the Emergency Department and triage nurse.  Should you have questions after your visit or need to cancel or reschedule your appointment, please contact [facility]COL[facility]  Dept: [phone]  and follow the prompts.  Office hours are 8:00 a.m. to 4:30 p.m. Monday - Friday. Please note that voicemails left after 4:00 p.m. may not be returned until the following business day.  We are closed weekends and major holidays. You have access to a nurse at all times for urgent questions. Please call the main number to the clinic Dept: [phone] and follow the prompts.   For any non-urgent questions, you may also contact your provider using MyChart. We now offer e-Visits for anyone 18 and older to request care online for non-urgent symptoms. For details visit [email or web address].   Also download the MyChart app! Go to the app store, search "MyChart", open the app, select [facility], and log in with your MyChart username and password.  Due to Covid, a mask is required upon entering the hospital/clinic. If you do not have a mask, one will be given to you upon arrival. For doctor visits, patients may have 1 support person aged 18 or older with them. For treatment visits, patients cannot have anyone with them due to current Covid guidelines and our immunocompromised population.   

## 2021-07-31 ENCOUNTER — Encounter: Payer: Self-pay | Admitting: Family Medicine

## 2021-07-31 NOTE — Addendum Note (Signed)
Addended by: Sullivan Lone on: 07/31/2021 01:13 PM   Modules accepted: Orders

## 2021-08-01 NOTE — Progress Notes (Signed)
Phone [phone] Virtual visit via phonenote   Subjective:   Chief Complaint  Patient presents with   URI    Pt states she is having symptoms of productive cough, runny nose, fatigue, headache. Symptoms started 10/21. Covid test at home on 10/22 and 10/27 came back negative. Pt states a provider changed her BP med on 10/11 at her visit and her BP has been higher since she    This visit type was conducted due to national recommendations for restrictions regarding the COVID-19 Pandemic (e.g. social distancing).  This format is felt to be most appropriate for this patient at this time balancing risks to patient and risks to population by having him in for in person visit.  All issues noted in this document were discussed and addressed.  No physical exam was performed (except for noted visual exam or audio findings with Telehealth visits).  The patient has consented to conduct a Telehealth visit and understands insurance will be billed.   Our team/I connected with [name] at  8:40 AM EDT by phone (patient did not have equipment for webex) and verified that I am speaking with the correct person using two identifiers.  Location patient: [facility] Location provider: [facility], office Persons participating in the virtual visit:  patient  Time on phone: 14 minutes  Our team/I discussed the limitations of evaluation and management by telemedicine and the availability of in person appointments. In light of current covid-19 pandemic, patient also understands that we are trying to protect them by minimizing in office contact if at all possible.  The patient expressed consent for telemedicine visit and agreed to proceed. Patient understands insurance will be billed.   Past Medical History-  Patient Active Problem List   Diagnosis Date Noted   Papillary thyroid carcinoma ([place]) 08/01/2020    Priority: 1.   Left thyroid nodule 06/18/2020    Priority: 1.   Multiple myeloma not having achieved  remission ([place]) 12/12/2018    Priority: 1.   Pain in thoracic spine 11/03/2018    Priority: 2.   Hyperglycemia 03/29/2015    Priority: 2.   Hyperlipidemia 10/18/2014    Priority: 2.   Essential hypertension 03/11/2007    Priority: 2.   Counseling regarding advance care planning and goals of care 12/12/2018    Priority: 3.   History of adenomatous polyp of colon 07/23/2015    Priority: 3.   Raynaud phenomenon 10/19/2014    Priority: 3.   Syncope 10/19/2014    Priority: 3.   Glaucoma 01/05/2011    Priority: 3.   Osteoporosis 03/11/2007    Priority: 3.   Multiple myeloma in relapse ([place]) 04/16/2021   Prediabetes 06/18/2020   Primary open angle glaucoma (POAG) of right eye, moderate stage 10/11/2017   Combined forms of age-related cataract of left eye 04/28/2017    Medications- reviewed and updated Current Outpatient Medications  Medication Sig Dispense Refill   acyclovir (ZOVIRAX) 400 MG tablet Take 1 tablet (400 mg total) by mouth 2 (two) times daily. 60 tablet 3   ALPHAGAN P 0.1 % SOLN Place 1 drop into both eyes in the morning, at noon, and at bedtime.      amLODipine (NORVASC) 2.5 MG tablet Take 1 tablet (2.5 mg total) by mouth daily. Take along with $RemoveBe'5mg'mOFiEMjQl$  tablet. 90 tablet 3   amLODipine (NORVASC) 5 MG tablet Take 1 tablet (5 mg total) by mouth daily. Take along with 2.$RemoveBefo'5mg'xKyrzglqddL$  tablet. 90 tablet 3   aspirin EC 81  MG tablet Take 81 mg by mouth daily.     bimatoprost (LUMIGAN) 0.03 % ophthalmic solution Place 1 drop into both eyes at bedtime.      Cholecalciferol (VITAMIN D3) 125 MCG (5000 UT) TABS Take 5,000 Units by mouth daily.     Co-Enzyme Q-10 100 MG CAPS Take 100 mg by mouth daily.     dexamethasone (DECADRON) 4 MG tablet Take 5 tablets ($RemoveBe'20mg'FSyffBrBQ$ ) on day 22 of each cycle. Repeat every 28 days.  Take with breakfast. 12 tablet 3   dorzolamide-timolol (COSOPT) 22.3-6.8 MG/ML ophthalmic solution Place 1 drop into both eyes 2 (two) times daily.   11   doxycycline (VIBRA-TABS) 100 MG  tablet Take 1 tablet (100 mg total) by mouth 2 (two) times daily for 7 days. 14 tablet 0   fentaNYL (DURAGESIC) 12 MCG/HR Place 1 patch onto the skin every 3 (three) days. 10 patch 0   hydrOXYzine (ATARAX/VISTARIL) 25 MG tablet Take 1 tablet (25 mg total) by mouth 3 (three) times daily as needed. 30 tablet 0   levothyroxine (SYNTHROID) 88 MCG tablet Take 1 tablet (88 mcg total) by mouth daily before breakfast. 45 tablet 5   lidocaine-prilocaine (EMLA) cream Apply 1 application topically as needed. Apply prior to port access. 30 g 0   magnesium chloride (SLOW-MAG) 64 MG TBEC SR tablet Take by mouth.     Multiple Vitamins-Minerals (MULTIVITAMIN WITH MINERALS) tablet Take 1 tablet by mouth daily. Centrum Silver 50 +     Omega-3 1000 MG CAPS Take 1,000 mg by mouth daily.      ondansetron (ZOFRAN) 8 MG tablet Take 8 mg by mouth 30 to 60 min prior to Cytoxan administration then take 8 mg twice daily as needed for nausea and vomiting. 30 tablet 1   oxyCODONE-acetaminophen (PERCOCET) 5-325 MG tablet Take 1-2 tablets by mouth every 4 (four) hours as needed for moderate pain or severe pain. 90 tablet 0   polyethylene glycol (MIRALAX) packet Take 17 g by mouth daily. 30 each 1   prochlorperazine (COMPAZINE) 10 MG tablet Take 1 tablet (10 mg total) by mouth every 6 (six) hours as needed (Nausea or vomiting). 30 tablet 1   Simethicone (GAS-X PO) Take 1 tablet by mouth as needed.     vitamin C (ASCORBIC ACID) 500 MG tablet Take 500 mg by mouth 2 (two) times a week.      amLODipine (NORVASC) 5 MG tablet Take 1 tablet (5 mg total) by mouth at bedtime. (Patient not taking: Reported on 08/04/2021) 90 tablet 3   senna-docusate (SENOKOT-S) 8.6-50 MG tablet Take 2 tablets by mouth at bedtime. (Patient not taking: Reported on 08/04/2021) 60 tablet 2   No current facility-administered medications for this visit.     Objective:  BP (!) 144/87   Ht 5' 0.98" (1.549 m)   Wt 107 lb (48.5 kg)   BMI 20.23 kg/m  self  reported vitals  Nonlabored voice, normal speech      Assessment and Plan   #Sinus congestion and cough greater than 10 days S: oct 21st Friday started with a sore throat. Then started with nasal discharge. Saturday morning did home covid test and was negative.  Later on 27th did another covid test. When coughing sometimes dry or clear- other times there is some yellow creamy tint to mucus  Husband sick with similar illness starting mid last week. Her throat he feels better. No fever (highest mid 90s)  or shortness of breath. Clear drainage from nasal  passages. Overall pretty similar over last week.  A/P: Likely bacterial sinusitis with over 10 days of symptoms.  Considering patient is on Decadron and chemotherapy likely  immunosuppression increasing risk of bacterial disease-we will treat with doxycycline (penicillin allergy).  Considered azithromycin as she has tolerated erythromycin in the past but can prolong QT and needs with chemo-since she has not an office and I cannot get an EKG while on medication we thought doxycycline was a safer treatment option. - We discussed if begins to have shortness of breath or symptoms persist consider chest x-ray - Hoping for improvement within 24 to 48 hours  #hypertension S: medication: amlodipine 7.5 mg- started in last few weeks -she is on decadron and has had recent chemo infusion  BP Readings from Last 3 Encounters:  08/04/21 (!) 144/87  07/30/21 (!) 163/77  07/23/21 (!) 158/78  A/P: Blood pressure has been running higher since she has been ill and on chemotherapy and Decadron-she is going to monitor over the coming weeks and if fails to continue to improve she will reach out and we can make adjustments as needed  Recommended follow up: Return for as needed for new, worsening, persistent symptoms. Future Appointments  Date Time Provider [place]  08/06/2021  8:45 AM [facility] [place] FLUSH [facility] None  08/06/2021  9:30 AM [facility]  INFUSION [facility] None  08/11/2021  9:45 AM [facility] LAB [facility] None  08/20/2021  9:45 AM [facility] [place] FLUSH [facility] None  08/20/2021 10:20 AM [name], MD [facility] None  08/20/2021 11:15 AM [facility] INFUSION [facility] None  08/27/2021  8:30 AM [facility] [place] FLUSH [facility] None  08/27/2021  9:30 AM [facility] INFUSION [facility] None  09/03/2021  8:30 AM [facility] [place] FLUSH [facility] None  09/03/2021  9:30 AM [facility] INFUSION [facility] None  09/17/2021  9:30 AM [facility] INFUSION [facility] None  11/12/2021  9:30 AM [facility] INFUSION [facility] None  12/29/2021  9:40 AM [name], MD [facility] None  01/07/2022  9:30 AM [facility] INFUSION [facility] None  02/02/2022  9:20 AM Lamaria Hildebrandt, Brayton Mars, MD [facility]    Lab/Order associations:   ICD-10-CM   1. Bacterial sinusitis  J32.9    B96.89       Meds ordered this encounter  Medications   doxycycline (VIBRA-TABS) 100 MG tablet    Sig: Take 1 tablet (100 mg total) by mouth 2 (two) times daily for 7 days.    Dispense:  14 tablet    Refill:  0    Return precautions advised.  Garret Reddish, MD

## 2021-08-04 ENCOUNTER — Telehealth (INDEPENDENT_AMBULATORY_CARE_PROVIDER_SITE_OTHER): Payer: Medicare HMO | Admitting: Family Medicine

## 2021-08-04 ENCOUNTER — Encounter: Payer: Self-pay | Admitting: Family Medicine

## 2021-08-04 VITALS — BP 144/87 | Ht 60.98 in | Wt 107.0 lb

## 2021-08-04 DIAGNOSIS — J329 Chronic sinusitis, unspecified: Secondary | ICD-10-CM

## 2021-08-04 DIAGNOSIS — B9689 Other specified bacterial agents as the cause of diseases classified elsewhere: Secondary | ICD-10-CM | POA: Diagnosis not present

## 2021-08-04 MED ORDER — DOXYCYCLINE HYCLATE 100 MG PO TABS: 100.0000 mg | ORAL_TABLET | Freq: Two times a day (BID) | ORAL | 0 refills | Status: AC

## 2021-08-04 NOTE — Patient Instructions (Signed)
Health Maintenance Due  Topic Date Due   TETANUS/TDAP  02/11/2020   COLONOSCOPY (Pts 45-23yrs Insurance coverage will need to be confirmed)  07/15/2020   COVID-19 Vaccine (5 - Booster for [place] series) 03/06/2021    Recommended follow up: No follow-ups on file.

## 2021-08-05 MED FILL — Dexamethasone Sodium Phosphate Inj 100 MG/10ML: INTRAMUSCULAR | Qty: 1 | Status: AC

## 2021-08-06 ENCOUNTER — Inpatient Hospital Stay: Payer: Medicare HMO

## 2021-08-06 ENCOUNTER — Other Ambulatory Visit: Payer: Self-pay

## 2021-08-06 ENCOUNTER — Inpatient Hospital Stay: Payer: Medicare HMO | Attending: Hematology

## 2021-08-06 VITALS — BP 153/86 | HR 75 | Temp 98.5°F | Resp 18

## 2021-08-06 DIAGNOSIS — Z86010 Personal history of colonic polyps: Secondary | ICD-10-CM | POA: Insufficient documentation

## 2021-08-06 DIAGNOSIS — T451X5A Adverse effect of antineoplastic and immunosuppressive drugs, initial encounter: Secondary | ICD-10-CM | POA: Insufficient documentation

## 2021-08-06 DIAGNOSIS — Z5111 Encounter for antineoplastic chemotherapy: Secondary | ICD-10-CM

## 2021-08-06 DIAGNOSIS — Z85850 Personal history of malignant neoplasm of thyroid: Secondary | ICD-10-CM | POA: Insufficient documentation

## 2021-08-06 DIAGNOSIS — C9000 Multiple myeloma not having achieved remission: Secondary | ICD-10-CM | POA: Diagnosis not present

## 2021-08-06 DIAGNOSIS — M8580 Other specified disorders of bone density and structure, unspecified site: Secondary | ICD-10-CM | POA: Diagnosis not present

## 2021-08-06 DIAGNOSIS — Z79899 Other long term (current) drug therapy: Secondary | ICD-10-CM | POA: Insufficient documentation

## 2021-08-06 DIAGNOSIS — C9002 Multiple myeloma in relapse: Secondary | ICD-10-CM

## 2021-08-06 DIAGNOSIS — Z95828 Presence of other vascular implants and grafts: Secondary | ICD-10-CM

## 2021-08-06 DIAGNOSIS — Z7982 Long term (current) use of aspirin: Secondary | ICD-10-CM | POA: Diagnosis not present

## 2021-08-06 DIAGNOSIS — I10 Essential (primary) hypertension: Secondary | ICD-10-CM | POA: Diagnosis not present

## 2021-08-06 DIAGNOSIS — Z7189 Other specified counseling: Secondary | ICD-10-CM

## 2021-08-06 DIAGNOSIS — Z5112 Encounter for antineoplastic immunotherapy: Secondary | ICD-10-CM | POA: Diagnosis present

## 2021-08-06 DIAGNOSIS — D6481 Anemia due to antineoplastic chemotherapy: Secondary | ICD-10-CM | POA: Insufficient documentation

## 2021-08-06 LAB — CBC WITH DIFFERENTIAL/PLATELET
Abs Immature Granulocytes: 0.08 10*3/uL — ABNORMAL HIGH (ref 0.00–0.07)
Basophils Absolute: 0 10*3/uL (ref 0.0–0.1)
Basophils Relative: 1 %
Eosinophils Absolute: 0.4 10*3/uL (ref 0.0–0.5)
Eosinophils Relative: 7 %
HCT: 32.2 % — ABNORMAL LOW (ref 36.0–46.0)
Hemoglobin: 10.7 g/dL — ABNORMAL LOW (ref 12.0–15.0)
Immature Granulocytes: 1 %
Lymphocytes Relative: 6 %
Lymphs Abs: 0.3 10*3/uL — ABNORMAL LOW (ref 0.7–4.0)
MCH: 34.6 pg — ABNORMAL HIGH (ref 26.0–34.0)
MCHC: 33.2 g/dL (ref 30.0–36.0)
MCV: 104.2 fL — ABNORMAL HIGH (ref 80.0–100.0)
Monocytes Absolute: 0.9 10*3/uL (ref 0.1–1.0)
Monocytes Relative: 15 %
Neutro Abs: 4.1 10*3/uL (ref 1.7–7.7)
Neutrophils Relative %: 70 %
Platelets: 256 10*3/uL (ref 150–400)
RBC: 3.09 MIL/uL — ABNORMAL LOW (ref 3.87–5.11)
RDW: 13.5 % (ref 11.5–15.5)
WBC: 5.9 10*3/uL (ref 4.0–10.5)
nRBC: 0 % (ref 0.0–0.2)

## 2021-08-06 LAB — CMP (CANCER CENTER ONLY)
ALT: 13 U/L (ref 0–44)
AST: 13 U/L — ABNORMAL LOW (ref 15–41)
Albumin: 3.6 g/dL (ref 3.5–5.0)
Alkaline Phosphatase: 42 U/L (ref 38–126)
Anion gap: 8 (ref 5–15)
BUN: 12 mg/dL (ref 8–23)
CO2: 24 mmol/L (ref 22–32)
Calcium: 8.8 mg/dL — ABNORMAL LOW (ref 8.9–10.3)
Chloride: 108 mmol/L (ref 98–111)
Creatinine: 0.62 mg/dL (ref 0.44–1.00)
GFR, Estimated: >60 mL/min (ref >60)
Glucose, Bld: 99 mg/dL (ref 70–99)
Potassium: 4.1 mmol/L (ref 3.5–5.1)
Sodium: 140 mmol/L (ref 135–145)
Total Bilirubin: 0.5 mg/dL (ref 0.3–1.2)
Total Protein: 6.4 g/dL — ABNORMAL LOW (ref 6.5–8.1)

## 2021-08-06 MED ORDER — SODIUM CHLORIDE 0.9 % IV SOLN: Freq: Once | INTRAVENOUS | Status: AC

## 2021-08-06 MED ORDER — SODIUM CHLORIDE 0.9% FLUSH
10.0000 mL | INTRAVENOUS | Status: DC | PRN
  Administered 2021-08-06: 10 mL via INTRAVENOUS

## 2021-08-06 MED ORDER — DEXTROSE 5 % IV SOLN
56.0000 mg/m2 | Freq: Once | INTRAVENOUS | Status: AC
  Administered 2021-08-06: 80 mg via INTRAVENOUS
  Filled 2021-08-06: qty 30

## 2021-08-06 MED ORDER — SODIUM CHLORIDE 0.9 % IV SOLN
10.0000 mg | Freq: Once | INTRAVENOUS | Status: AC
  Administered 2021-08-06: 10 mg via INTRAVENOUS
  Filled 2021-08-06: qty 10

## 2021-08-06 MED ORDER — SODIUM CHLORIDE 0.9% FLUSH
10.0000 mL | INTRAVENOUS | Status: DC | PRN
  Administered 2021-08-06: 10 mL

## 2021-08-06 MED ORDER — SODIUM CHLORIDE 0.9 % IV SOLN: Freq: Once | INTRAVENOUS | Status: DC

## 2021-08-06 MED ORDER — ONDANSETRON HCL 4 MG/2ML IJ SOLN
4.0000 mg | Freq: Once | INTRAMUSCULAR | Status: AC
  Administered 2021-08-06: 4 mg via INTRAVENOUS
  Filled 2021-08-06: qty 2

## 2021-08-06 MED ORDER — HEPARIN SOD (PORK) LOCK FLUSH 100 UNIT/ML IV SOLN
500.0000 [IU] | Freq: Once | INTRAVENOUS | Status: AC | PRN
  Administered 2021-08-06: 500 [IU]

## 2021-08-06 MED ORDER — ACETAMINOPHEN 500 MG PO TABS
1000.0000 mg | ORAL_TABLET | Freq: Once | ORAL | Status: AC
  Administered 2021-08-06: 1000 mg via ORAL
  Filled 2021-08-06: qty 2

## 2021-08-06 NOTE — Patient Instructions (Signed)
[facility]  Discharge Instructions: Thank you for choosing [facility] to provide your oncology and hematology care.   If you have a lab appointment with the Cancer Center, please go directly to the Cancer Center and check in at the registration area.   Wear comfortable clothing and clothing appropriate for easy access to any Portacath or PICC line.   We strive to give you quality time with your provider. You may need to reschedule your appointment if you arrive late (15 or more minutes).  Arriving late affects you and other patients whose appointments are after yours.  Also, if you miss three or more appointments without notifying the office, you may be dismissed from the clinic at the provider's discretion.      For prescription refill requests, have your pharmacy contact our office and allow 72 hours for refills to be completed.    Today you received the following chemotherapy and/or immunotherapy agents: Kyprolis    To help prevent nausea and vomiting after your treatment, we encourage you to take your nausea medication as directed.  BELOW ARE SYMPTOMS THAT SHOULD BE REPORTED IMMEDIATELY: . *FEVER GREATER THAN 100.4 F (38 C) OR HIGHER . *CHILLS OR SWEATING . *NAUSEA AND VOMITING THAT IS NOT CONTROLLED WITH YOUR NAUSEA MEDICATION . *UNUSUAL SHORTNESS OF BREATH . *UNUSUAL BRUISING OR BLEEDING . *URINARY PROBLEMS (pain or burning when urinating, or frequent urination) . *BOWEL PROBLEMS (unusual diarrhea, constipation, pain near the anus) . TENDERNESS IN MOUTH AND THROAT WITH OR WITHOUT PRESENCE OF ULCERS (sore throat, sores in mouth, or a toothache) . UNUSUAL RASH, SWELLING OR PAIN  . UNUSUAL VAGINAL DISCHARGE OR ITCHING   Items with * indicate a potential emergency and should be followed up as soon as possible or go to the Emergency Department if any problems should occur.  Please show the CHEMOTHERAPY ALERT CARD or IMMUNOTHERAPY ALERT  CARD at check-in to the Emergency Department and triage nurse.  Should you have questions after your visit or need to cancel or reschedule your appointment, please contact [facility]  Dept: [phone]  and follow the prompts.  Office hours are 8:00 a.m. to 4:30 p.m. Monday - Friday. Please note that voicemails left after 4:00 p.m. may not be returned until the following business day.  We are closed weekends and major holidays. You have access to a nurse at all times for urgent questions. Please call the main number to the clinic Dept: [phone] and follow the prompts.   For any non-urgent questions, you may also contact your provider using MyChart. We now offer e-Visits for anyone 18 and older to request care online for non-urgent symptoms. For details visit [email or web address].   Also download the MyChart app! Go to the app store, search "MyChart", open the app, select , and log in with your MyChart username and password.  Due to Covid, a mask is required upon entering the hospital/clinic. If you do not have a mask, one will be given to you upon arrival. For doctor visits, patients may have 1 support person aged 18 or older with them. For treatment visits, patients cannot have anyone with them due to current Covid guidelines and our immunocompromised population.   

## 2021-08-07 ENCOUNTER — Encounter: Payer: Self-pay | Admitting: Internal Medicine

## 2021-08-07 LAB — KAPPA/LAMBDA LIGHT CHAINS
Kappa free light chain: 7.1 mg/L (ref 3.3–19.4)
Kappa, lambda light chain ratio: 0.34 (ref 0.26–1.65)
Lambda free light chains: 21.1 mg/L (ref 5.7–26.3)

## 2021-08-11 ENCOUNTER — Other Ambulatory Visit (INDEPENDENT_AMBULATORY_CARE_PROVIDER_SITE_OTHER): Payer: Medicare HMO

## 2021-08-11 ENCOUNTER — Other Ambulatory Visit: Payer: Self-pay

## 2021-08-11 DIAGNOSIS — E890 Postprocedural hypothyroidism: Secondary | ICD-10-CM

## 2021-08-11 LAB — TSH: TSH: 2.74 u[IU]/mL (ref 0.35–5.50)

## 2021-08-12 LAB — MULTIPLE MYELOMA PANEL, SERUM
Albumin SerPl Elph-Mcnc: 3.4 g/dL (ref 2.9–4.4)
Albumin/Glob SerPl: 1.5 (ref 0.7–1.7)
Alpha 1: 0.3 g/dL (ref 0.0–0.4)
Alpha2 Glob SerPl Elph-Mcnc: 0.8 g/dL (ref 0.4–1.0)
B-Globulin SerPl Elph-Mcnc: 1 g/dL (ref 0.7–1.3)
Gamma Glob SerPl Elph-Mcnc: 0.2 g/dL — ABNORMAL LOW (ref 0.4–1.8)
Globulin, Total: 2.3 g/dL (ref 2.2–3.9)
IgA: 20 mg/dL — ABNORMAL LOW (ref 64–422)
IgG (Immunoglobin G), Serum: 614 mg/dL (ref 586–1602)
IgM (Immunoglobulin M), Srm: 17 mg/dL — ABNORMAL LOW (ref 26–217)
M Protein SerPl Elph-Mcnc: 0.4 g/dL — ABNORMAL HIGH
Total Protein ELP: 5.7 g/dL — ABNORMAL LOW (ref 6.0–8.5)

## 2021-08-20 ENCOUNTER — Other Ambulatory Visit: Payer: Self-pay

## 2021-08-20 ENCOUNTER — Inpatient Hospital Stay: Payer: Medicare HMO | Admitting: Hematology

## 2021-08-20 ENCOUNTER — Inpatient Hospital Stay: Payer: Medicare HMO

## 2021-08-20 VITALS — BP 167/92

## 2021-08-20 VITALS — BP 187/91 | HR 69 | Temp 99.0°F | Resp 18 | Wt 104.6 lb

## 2021-08-20 DIAGNOSIS — G893 Neoplasm related pain (acute) (chronic): Secondary | ICD-10-CM | POA: Diagnosis not present

## 2021-08-20 DIAGNOSIS — M8580 Other specified disorders of bone density and structure, unspecified site: Secondary | ICD-10-CM | POA: Diagnosis not present

## 2021-08-20 DIAGNOSIS — T451X5A Adverse effect of antineoplastic and immunosuppressive drugs, initial encounter: Secondary | ICD-10-CM | POA: Diagnosis not present

## 2021-08-20 DIAGNOSIS — Z5111 Encounter for antineoplastic chemotherapy: Secondary | ICD-10-CM

## 2021-08-20 DIAGNOSIS — C9002 Multiple myeloma in relapse: Secondary | ICD-10-CM

## 2021-08-20 DIAGNOSIS — Z79899 Other long term (current) drug therapy: Secondary | ICD-10-CM | POA: Diagnosis not present

## 2021-08-20 DIAGNOSIS — Z7189 Other specified counseling: Secondary | ICD-10-CM

## 2021-08-20 DIAGNOSIS — C9000 Multiple myeloma not having achieved remission: Secondary | ICD-10-CM | POA: Diagnosis not present

## 2021-08-20 DIAGNOSIS — D6481 Anemia due to antineoplastic chemotherapy: Secondary | ICD-10-CM | POA: Diagnosis not present

## 2021-08-20 DIAGNOSIS — Z5112 Encounter for antineoplastic immunotherapy: Secondary | ICD-10-CM | POA: Diagnosis not present

## 2021-08-20 DIAGNOSIS — Z85850 Personal history of malignant neoplasm of thyroid: Secondary | ICD-10-CM | POA: Diagnosis not present

## 2021-08-20 DIAGNOSIS — Z86010 Personal history of colonic polyps: Secondary | ICD-10-CM | POA: Diagnosis not present

## 2021-08-20 DIAGNOSIS — Z7982 Long term (current) use of aspirin: Secondary | ICD-10-CM | POA: Diagnosis not present

## 2021-08-20 DIAGNOSIS — Z95828 Presence of other vascular implants and grafts: Secondary | ICD-10-CM

## 2021-08-20 DIAGNOSIS — I10 Essential (primary) hypertension: Secondary | ICD-10-CM | POA: Diagnosis not present

## 2021-08-20 LAB — CMP (CANCER CENTER ONLY)
ALT: 12 U/L (ref 0–44)
AST: 15 U/L (ref 15–41)
Albumin: 3.9 g/dL (ref 3.5–5.0)
Alkaline Phosphatase: 35 U/L — ABNORMAL LOW (ref 38–126)
Anion gap: 7 (ref 5–15)
BUN: 12 mg/dL (ref 8–23)
CO2: 25 mmol/L (ref 22–32)
Calcium: 8.8 mg/dL — ABNORMAL LOW (ref 8.9–10.3)
Chloride: 108 mmol/L (ref 98–111)
Creatinine: 0.66 mg/dL (ref 0.44–1.00)
GFR, Estimated: >60 mL/min (ref >60)
Glucose, Bld: 95 mg/dL (ref 70–99)
Potassium: 4 mmol/L (ref 3.5–5.1)
Sodium: 140 mmol/L (ref 135–145)
Total Bilirubin: 0.4 mg/dL (ref 0.3–1.2)
Total Protein: 6.4 g/dL — ABNORMAL LOW (ref 6.5–8.1)

## 2021-08-20 LAB — CBC WITH DIFFERENTIAL/PLATELET
Abs Immature Granulocytes: 0.02 10*3/uL (ref 0.00–0.07)
Basophils Absolute: 0.1 10*3/uL (ref 0.0–0.1)
Basophils Relative: 2 %
Eosinophils Absolute: 0.4 10*3/uL (ref 0.0–0.5)
Eosinophils Relative: 10 %
HCT: 34 % — ABNORMAL LOW (ref 36.0–46.0)
Hemoglobin: 11 g/dL — ABNORMAL LOW (ref 12.0–15.0)
Immature Granulocytes: 1 %
Lymphocytes Relative: 8 %
Lymphs Abs: 0.4 10*3/uL — ABNORMAL LOW (ref 0.7–4.0)
MCH: 34.5 pg — ABNORMAL HIGH (ref 26.0–34.0)
MCHC: 32.4 g/dL (ref 30.0–36.0)
MCV: 106.6 fL — ABNORMAL HIGH (ref 80.0–100.0)
Monocytes Absolute: 0.6 10*3/uL (ref 0.1–1.0)
Monocytes Relative: 14 %
Neutro Abs: 2.7 10*3/uL (ref 1.7–7.7)
Neutrophils Relative %: 65 %
Platelets: 304 10*3/uL (ref 150–400)
RBC: 3.19 MIL/uL — ABNORMAL LOW (ref 3.87–5.11)
RDW: 13.3 % (ref 11.5–15.5)
WBC: 4.2 10*3/uL (ref 4.0–10.5)
nRBC: 0 % (ref 0.0–0.2)

## 2021-08-20 MED ORDER — HEPARIN SOD (PORK) LOCK FLUSH 100 UNIT/ML IV SOLN
500.0000 [IU] | Freq: Once | INTRAVENOUS | Status: AC | PRN
  Administered 2021-08-20: 500 [IU]

## 2021-08-20 MED ORDER — SODIUM CHLORIDE 0.9% FLUSH
10.0000 mL | INTRAVENOUS | Status: DC | PRN
  Administered 2021-08-20: 10 mL

## 2021-08-20 MED ORDER — SODIUM CHLORIDE 0.9 % IV SOLN: Freq: Once | INTRAVENOUS | Status: AC

## 2021-08-20 MED ORDER — SODIUM CHLORIDE 0.9% FLUSH
10.0000 mL | Freq: Once | INTRAVENOUS | Status: AC
  Administered 2021-08-20: 10 mL via INTRAVENOUS

## 2021-08-20 MED ORDER — ACETAMINOPHEN 500 MG PO TABS
1000.0000 mg | ORAL_TABLET | Freq: Once | ORAL | Status: AC
  Administered 2021-08-20: 1000 mg via ORAL
  Filled 2021-08-20: qty 2

## 2021-08-20 MED ORDER — SODIUM CHLORIDE 0.9 % IV SOLN
10.0000 mg | Freq: Once | INTRAVENOUS | Status: AC
  Administered 2021-08-20: 10 mg via INTRAVENOUS
  Filled 2021-08-20: qty 10

## 2021-08-20 MED ORDER — DEXTROSE 5 % IV SOLN
56.0000 mg/m2 | Freq: Once | INTRAVENOUS | Status: AC
  Administered 2021-08-20: 80 mg via INTRAVENOUS
  Filled 2021-08-20: qty 30

## 2021-08-20 MED ORDER — ONDANSETRON HCL 4 MG/2ML IJ SOLN
4.0000 mg | Freq: Once | INTRAMUSCULAR | Status: AC
  Administered 2021-08-20: 4 mg via INTRAVENOUS
  Filled 2021-08-20: qty 2

## 2021-08-20 NOTE — Patient Instructions (Signed)
[facility]  Discharge Instructions: Thank you for choosing [facility] to provide your oncology and hematology care.   If you have a lab appointment with the Cancer Center, please go directly to the Cancer Center and check in at the registration area.   Wear comfortable clothing and clothing appropriate for easy access to any Portacath or PICC line.   We strive to give you quality time with your provider. You may need to reschedule your appointment if you arrive late (15 or more minutes).  Arriving late affects you and other patients whose appointments are after yours.  Also, if you miss three or more appointments without notifying the office, you may be dismissed from the clinic at the provider's discretion.      For prescription refill requests, have your pharmacy contact our office and allow 72 hours for refills to be completed.    Today you received the following chemotherapy and/or immunotherapy agents: Kyprolis    To help prevent nausea and vomiting after your treatment, we encourage you to take your nausea medication as directed.  BELOW ARE SYMPTOMS THAT SHOULD BE REPORTED IMMEDIATELY: . *FEVER GREATER THAN 100.4 F (38 C) OR HIGHER . *CHILLS OR SWEATING . *NAUSEA AND VOMITING THAT IS NOT CONTROLLED WITH YOUR NAUSEA MEDICATION . *UNUSUAL SHORTNESS OF BREATH . *UNUSUAL BRUISING OR BLEEDING . *URINARY PROBLEMS (pain or burning when urinating, or frequent urination) . *BOWEL PROBLEMS (unusual diarrhea, constipation, pain near the anus) . TENDERNESS IN MOUTH AND THROAT WITH OR WITHOUT PRESENCE OF ULCERS (sore throat, sores in mouth, or a toothache) . UNUSUAL RASH, SWELLING OR PAIN  . UNUSUAL VAGINAL DISCHARGE OR ITCHING   Items with * indicate a potential emergency and should be followed up as soon as possible or go to the Emergency Department if any problems should occur.  Please show the CHEMOTHERAPY ALERT CARD or IMMUNOTHERAPY ALERT  CARD at check-in to the Emergency Department and triage nurse.  Should you have questions after your visit or need to cancel or reschedule your appointment, please contact [facility]  Dept: [phone]  and follow the prompts.  Office hours are 8:00 a.m. to 4:30 p.m. Monday - Friday. Please note that voicemails left after 4:00 p.m. may not be returned until the following business day.  We are closed weekends and major holidays. You have access to a nurse at all times for urgent questions. Please call the main number to the clinic Dept: [phone] and follow the prompts.   For any non-urgent questions, you may also contact your provider using MyChart. We now offer e-Visits for anyone 18 and older to request care online for non-urgent symptoms. For details visit [email or web address].   Also download the MyChart app! Go to the app store, search "MyChart", open the app, select [facility], and log in with your MyChart username and password.  Due to Covid, a mask is required upon entering the hospital/clinic. If you do not have a mask, one will be given to you upon arrival. For doctor visits, patients may have 1 support person aged 18 or older with them. For treatment visits, patients cannot have anyone with them due to current Covid guidelines and our immunocompromised population.   

## 2021-08-20 NOTE — Progress Notes (Signed)
OK to trt w/ elevated BP today per MD

## 2021-08-22 ENCOUNTER — Other Ambulatory Visit: Payer: Self-pay

## 2021-08-22 DIAGNOSIS — C9000 Multiple myeloma not having achieved remission: Secondary | ICD-10-CM

## 2021-08-22 MED ORDER — FENTANYL 12 MCG/HR TD PT72: 1.0000 | MEDICATED_PATCH | TRANSDERMAL | 0 refills | Status: DC

## 2021-08-25 DIAGNOSIS — H401122 Primary open-angle glaucoma, left eye, moderate stage: Secondary | ICD-10-CM | POA: Diagnosis not present

## 2021-08-25 DIAGNOSIS — H401112 Primary open-angle glaucoma, right eye, moderate stage: Secondary | ICD-10-CM | POA: Diagnosis not present

## 2021-08-26 ENCOUNTER — Encounter: Payer: Self-pay | Admitting: Hematology

## 2021-08-26 MED FILL — Dexamethasone Sodium Phosphate Inj 100 MG/10ML: INTRAMUSCULAR | Qty: 1 | Status: AC

## 2021-08-27 ENCOUNTER — Inpatient Hospital Stay: Payer: Medicare HMO

## 2021-08-27 ENCOUNTER — Other Ambulatory Visit: Payer: Self-pay

## 2021-08-27 ENCOUNTER — Encounter: Payer: Self-pay | Admitting: Family Medicine

## 2021-08-27 VITALS — BP 165/88 | HR 67 | Temp 98.6°F | Resp 18 | Wt 106.0 lb

## 2021-08-27 DIAGNOSIS — Z7982 Long term (current) use of aspirin: Secondary | ICD-10-CM | POA: Diagnosis not present

## 2021-08-27 DIAGNOSIS — M8580 Other specified disorders of bone density and structure, unspecified site: Secondary | ICD-10-CM | POA: Diagnosis not present

## 2021-08-27 DIAGNOSIS — Z79899 Other long term (current) drug therapy: Secondary | ICD-10-CM | POA: Diagnosis not present

## 2021-08-27 DIAGNOSIS — Z5111 Encounter for antineoplastic chemotherapy: Secondary | ICD-10-CM

## 2021-08-27 DIAGNOSIS — D6481 Anemia due to antineoplastic chemotherapy: Secondary | ICD-10-CM | POA: Diagnosis not present

## 2021-08-27 DIAGNOSIS — I10 Essential (primary) hypertension: Secondary | ICD-10-CM | POA: Diagnosis not present

## 2021-08-27 DIAGNOSIS — C9000 Multiple myeloma not having achieved remission: Secondary | ICD-10-CM | POA: Diagnosis not present

## 2021-08-27 DIAGNOSIS — Z95828 Presence of other vascular implants and grafts: Secondary | ICD-10-CM

## 2021-08-27 DIAGNOSIS — C9002 Multiple myeloma in relapse: Secondary | ICD-10-CM

## 2021-08-27 DIAGNOSIS — Z7189 Other specified counseling: Secondary | ICD-10-CM

## 2021-08-27 DIAGNOSIS — T451X5A Adverse effect of antineoplastic and immunosuppressive drugs, initial encounter: Secondary | ICD-10-CM | POA: Diagnosis not present

## 2021-08-27 DIAGNOSIS — Z5112 Encounter for antineoplastic immunotherapy: Secondary | ICD-10-CM | POA: Diagnosis not present

## 2021-08-27 DIAGNOSIS — Z85850 Personal history of malignant neoplasm of thyroid: Secondary | ICD-10-CM | POA: Diagnosis not present

## 2021-08-27 DIAGNOSIS — Z86010 Personal history of colonic polyps: Secondary | ICD-10-CM | POA: Diagnosis not present

## 2021-08-27 LAB — CBC WITH DIFFERENTIAL/PLATELET
Abs Immature Granulocytes: 0.04 10*3/uL (ref 0.00–0.07)
Basophils Absolute: 0.1 10*3/uL (ref 0.0–0.1)
Basophils Relative: 1 %
Eosinophils Absolute: 0.4 10*3/uL (ref 0.0–0.5)
Eosinophils Relative: 8 %
HCT: 32.4 % — ABNORMAL LOW (ref 36.0–46.0)
Hemoglobin: 10.8 g/dL — ABNORMAL LOW (ref 12.0–15.0)
Immature Granulocytes: 1 %
Lymphocytes Relative: 7 %
Lymphs Abs: 0.4 10*3/uL — ABNORMAL LOW (ref 0.7–4.0)
MCH: 34.7 pg — ABNORMAL HIGH (ref 26.0–34.0)
MCHC: 33.3 g/dL (ref 30.0–36.0)
MCV: 104.2 fL — ABNORMAL HIGH (ref 80.0–100.0)
Monocytes Absolute: 0.7 10*3/uL (ref 0.1–1.0)
Monocytes Relative: 13 %
Neutro Abs: 3.7 10*3/uL (ref 1.7–7.7)
Neutrophils Relative %: 70 %
Platelets: 170 10*3/uL (ref 150–400)
RBC: 3.11 MIL/uL — ABNORMAL LOW (ref 3.87–5.11)
RDW: 13.1 % (ref 11.5–15.5)
WBC: 5.2 10*3/uL (ref 4.0–10.5)
nRBC: 0 % (ref 0.0–0.2)

## 2021-08-27 LAB — CMP (CANCER CENTER ONLY)
ALT: 12 U/L (ref 0–44)
AST: 12 U/L — ABNORMAL LOW (ref 15–41)
Albumin: 3.6 g/dL (ref 3.5–5.0)
Alkaline Phosphatase: 38 U/L (ref 38–126)
Anion gap: 6 (ref 5–15)
BUN: 13 mg/dL (ref 8–23)
CO2: 25 mmol/L (ref 22–32)
Calcium: 8.8 mg/dL — ABNORMAL LOW (ref 8.9–10.3)
Chloride: 109 mmol/L (ref 98–111)
Creatinine: 0.63 mg/dL (ref 0.44–1.00)
GFR, Estimated: >60 mL/min (ref >60)
Glucose, Bld: 103 mg/dL — ABNORMAL HIGH (ref 70–99)
Potassium: 3.9 mmol/L (ref 3.5–5.1)
Sodium: 140 mmol/L (ref 135–145)
Total Bilirubin: 0.4 mg/dL (ref 0.3–1.2)
Total Protein: 6.3 g/dL — ABNORMAL LOW (ref 6.5–8.1)

## 2021-08-27 MED ORDER — SODIUM CHLORIDE 0.9 % IV SOLN
10.0000 mg | Freq: Once | INTRAVENOUS | Status: AC
  Administered 2021-08-27: 10 mg via INTRAVENOUS
  Filled 2021-08-27: qty 10

## 2021-08-27 MED ORDER — DEXTROSE 5 % IV SOLN
56.0000 mg/m2 | Freq: Once | INTRAVENOUS | Status: AC
  Administered 2021-08-27: 80 mg via INTRAVENOUS
  Filled 2021-08-27: qty 30

## 2021-08-27 MED ORDER — ACETAMINOPHEN 500 MG PO TABS
1000.0000 mg | ORAL_TABLET | Freq: Once | ORAL | Status: AC
  Administered 2021-08-27: 1000 mg via ORAL
  Filled 2021-08-27: qty 2

## 2021-08-27 MED ORDER — HEPARIN SOD (PORK) LOCK FLUSH 100 UNIT/ML IV SOLN
500.0000 [IU] | Freq: Once | INTRAVENOUS | Status: AC | PRN
  Administered 2021-08-27: 500 [IU]

## 2021-08-27 MED ORDER — ONDANSETRON HCL 4 MG/2ML IJ SOLN
4.0000 mg | Freq: Once | INTRAMUSCULAR | Status: AC
  Administered 2021-08-27: 4 mg via INTRAVENOUS
  Filled 2021-08-27: qty 2

## 2021-08-27 MED ORDER — SODIUM CHLORIDE 0.9% FLUSH
10.0000 mL | INTRAVENOUS | Status: DC | PRN
  Administered 2021-08-27: 10 mL

## 2021-08-27 MED ORDER — SODIUM CHLORIDE 0.9 % IV SOLN: Freq: Once | INTRAVENOUS | Status: AC

## 2021-08-27 MED ORDER — SODIUM CHLORIDE 0.9% FLUSH
10.0000 mL | Freq: Once | INTRAVENOUS | Status: AC
  Administered 2021-08-27: 10 mL via INTRAVENOUS

## 2021-08-27 NOTE — Patient Instructions (Signed)
[facility]  Discharge Instructions: Thank you for choosing [facility] to provide your oncology and hematology care.   If you have a lab appointment with the Cancer Center, please go directly to the Cancer Center and check in at the registration area.   Wear comfortable clothing and clothing appropriate for easy access to any Portacath or PICC line.   We strive to give you quality time with your provider. You may need to reschedule your appointment if you arrive late (15 or more minutes).  Arriving late affects you and other patients whose appointments are after yours.  Also, if you miss three or more appointments without notifying the office, you may be dismissed from the clinic at the provider's discretion.      For prescription refill requests, have your pharmacy contact our office and allow 72 hours for refills to be completed.    Today you received the following chemotherapy and/or immunotherapy agents: Kyprolis    To help prevent nausea and vomiting after your treatment, we encourage you to take your nausea medication as directed.  BELOW ARE SYMPTOMS THAT SHOULD BE REPORTED IMMEDIATELY: . *FEVER GREATER THAN 100.4 F (38 C) OR HIGHER . *CHILLS OR SWEATING . *NAUSEA AND VOMITING THAT IS NOT CONTROLLED WITH YOUR NAUSEA MEDICATION . *UNUSUAL SHORTNESS OF BREATH . *UNUSUAL BRUISING OR BLEEDING . *URINARY PROBLEMS (pain or burning when urinating, or frequent urination) . *BOWEL PROBLEMS (unusual diarrhea, constipation, pain near the anus) . TENDERNESS IN MOUTH AND THROAT WITH OR WITHOUT PRESENCE OF ULCERS (sore throat, sores in mouth, or a toothache) . UNUSUAL RASH, SWELLING OR PAIN  . UNUSUAL VAGINAL DISCHARGE OR ITCHING   Items with * indicate a potential emergency and should be followed up as soon as possible or go to the Emergency Department if any problems should occur.  Please show the CHEMOTHERAPY ALERT CARD or IMMUNOTHERAPY ALERT  CARD at check-in to the Emergency Department and triage nurse.  Should you have questions after your visit or need to cancel or reschedule your appointment, please contact [facility]  Dept: [phone]  and follow the prompts.  Office hours are 8:00 a.m. to 4:30 p.m. Monday - Friday. Please note that voicemails left after 4:00 p.m. may not be returned until the following business day.  We are closed weekends and major holidays. You have access to a nurse at all times for urgent questions. Please call the main number to the clinic Dept: [phone] and follow the prompts.   For any non-urgent questions, you may also contact your provider using MyChart. We now offer e-Visits for anyone 18 and older to request care online for non-urgent symptoms. For details visit [email or web address].   Also download the MyChart app! Go to the app store, search "MyChart", open the app, select [facility], and log in with your MyChart username and password.  Due to Covid, a mask is required upon entering the hospital/clinic. If you do not have a mask, one will be given to you upon arrival. For doctor visits, patients may have 1 support person aged 18 or older with them. For treatment visits, patients cannot have anyone with them due to current Covid guidelines and our immunocompromised population.   

## 2021-09-02 MED FILL — Dexamethasone Sodium Phosphate Inj 100 MG/10ML: INTRAMUSCULAR | Qty: 1 | Status: AC

## 2021-09-03 ENCOUNTER — Other Ambulatory Visit: Payer: Self-pay

## 2021-09-03 ENCOUNTER — Inpatient Hospital Stay: Payer: Medicare HMO

## 2021-09-03 VITALS — BP 156/86 | HR 76 | Temp 98.4°F | Resp 17 | Wt 106.0 lb

## 2021-09-03 DIAGNOSIS — D6481 Anemia due to antineoplastic chemotherapy: Secondary | ICD-10-CM | POA: Diagnosis not present

## 2021-09-03 DIAGNOSIS — C9002 Multiple myeloma in relapse: Secondary | ICD-10-CM

## 2021-09-03 DIAGNOSIS — I10 Essential (primary) hypertension: Secondary | ICD-10-CM | POA: Diagnosis not present

## 2021-09-03 DIAGNOSIS — Z95828 Presence of other vascular implants and grafts: Secondary | ICD-10-CM

## 2021-09-03 DIAGNOSIS — Z7982 Long term (current) use of aspirin: Secondary | ICD-10-CM | POA: Diagnosis not present

## 2021-09-03 DIAGNOSIS — M8580 Other specified disorders of bone density and structure, unspecified site: Secondary | ICD-10-CM | POA: Diagnosis not present

## 2021-09-03 DIAGNOSIS — Z86010 Personal history of colonic polyps: Secondary | ICD-10-CM | POA: Diagnosis not present

## 2021-09-03 DIAGNOSIS — Z79899 Other long term (current) drug therapy: Secondary | ICD-10-CM | POA: Diagnosis not present

## 2021-09-03 DIAGNOSIS — Z7189 Other specified counseling: Secondary | ICD-10-CM

## 2021-09-03 DIAGNOSIS — Z5112 Encounter for antineoplastic immunotherapy: Secondary | ICD-10-CM | POA: Diagnosis not present

## 2021-09-03 DIAGNOSIS — T451X5A Adverse effect of antineoplastic and immunosuppressive drugs, initial encounter: Secondary | ICD-10-CM | POA: Diagnosis not present

## 2021-09-03 DIAGNOSIS — C9000 Multiple myeloma not having achieved remission: Secondary | ICD-10-CM | POA: Diagnosis not present

## 2021-09-03 DIAGNOSIS — Z5111 Encounter for antineoplastic chemotherapy: Secondary | ICD-10-CM | POA: Diagnosis not present

## 2021-09-03 DIAGNOSIS — Z85850 Personal history of malignant neoplasm of thyroid: Secondary | ICD-10-CM | POA: Diagnosis not present

## 2021-09-03 LAB — CMP (CANCER CENTER ONLY)
ALT: 17 U/L (ref 0–44)
AST: 15 U/L (ref 15–41)
Albumin: 3.9 g/dL (ref 3.5–5.0)
Alkaline Phosphatase: 34 U/L — ABNORMAL LOW (ref 38–126)
Anion gap: 9 (ref 5–15)
BUN: 15 mg/dL (ref 8–23)
CO2: 26 mmol/L (ref 22–32)
Calcium: 9.1 mg/dL (ref 8.9–10.3)
Chloride: 104 mmol/L (ref 98–111)
Creatinine: 0.46 mg/dL (ref 0.44–1.00)
GFR, Estimated: >60 mL/min (ref >60)
Glucose, Bld: 97 mg/dL (ref 70–99)
Potassium: 4 mmol/L (ref 3.5–5.1)
Sodium: 139 mmol/L (ref 135–145)
Total Bilirubin: 0.6 mg/dL (ref 0.3–1.2)
Total Protein: 6.3 g/dL — ABNORMAL LOW (ref 6.5–8.1)

## 2021-09-03 LAB — CBC WITH DIFFERENTIAL/PLATELET
Abs Immature Granulocytes: 0.03 10*3/uL (ref 0.00–0.07)
Basophils Absolute: 0.1 10*3/uL (ref 0.0–0.1)
Basophils Relative: 1 %
Eosinophils Absolute: 0.3 10*3/uL (ref 0.0–0.5)
Eosinophils Relative: 7 %
HCT: 32.4 % — ABNORMAL LOW (ref 36.0–46.0)
Hemoglobin: 10.9 g/dL — ABNORMAL LOW (ref 12.0–15.0)
Immature Granulocytes: 1 %
Lymphocytes Relative: 8 %
Lymphs Abs: 0.4 10*3/uL — ABNORMAL LOW (ref 0.7–4.0)
MCH: 35.5 pg — ABNORMAL HIGH (ref 26.0–34.0)
MCHC: 33.6 g/dL (ref 30.0–36.0)
MCV: 105.5 fL — ABNORMAL HIGH (ref 80.0–100.0)
Monocytes Absolute: 0.6 10*3/uL (ref 0.1–1.0)
Monocytes Relative: 12 %
Neutro Abs: 3.4 10*3/uL (ref 1.7–7.7)
Neutrophils Relative %: 71 %
Platelets: 264 10*3/uL (ref 150–400)
RBC: 3.07 MIL/uL — ABNORMAL LOW (ref 3.87–5.11)
RDW: 13.2 % (ref 11.5–15.5)
WBC: 4.8 10*3/uL (ref 4.0–10.5)
nRBC: 0 % (ref 0.0–0.2)

## 2021-09-03 MED ORDER — SODIUM CHLORIDE 0.9% FLUSH
10.0000 mL | INTRAVENOUS | Status: DC | PRN
  Administered 2021-09-03: 10 mL via INTRAVENOUS

## 2021-09-03 MED ORDER — DEXTROSE 5 % IV SOLN
56.0000 mg/m2 | Freq: Once | INTRAVENOUS | Status: AC
  Administered 2021-09-03: 80 mg via INTRAVENOUS
  Filled 2021-09-03: qty 30

## 2021-09-03 MED ORDER — SODIUM CHLORIDE 0.9% FLUSH
10.0000 mL | INTRAVENOUS | Status: DC | PRN
  Administered 2021-09-03: 10 mL

## 2021-09-03 MED ORDER — DEXAMETHASONE SODIUM PHOSPHATE 100 MG/10ML IJ SOLN
10.0000 mg | Freq: Once | INTRAMUSCULAR | Status: AC
  Administered 2021-09-03: 10 mg via INTRAVENOUS
  Filled 2021-09-03: qty 10

## 2021-09-03 MED ORDER — SODIUM CHLORIDE 0.9 % IV SOLN: Freq: Once | INTRAVENOUS | Status: AC

## 2021-09-03 MED ORDER — ACETAMINOPHEN 500 MG PO TABS
1000.0000 mg | ORAL_TABLET | Freq: Once | ORAL | Status: AC
  Administered 2021-09-03: 1000 mg via ORAL
  Filled 2021-09-03: qty 2

## 2021-09-03 MED ORDER — ONDANSETRON HCL 4 MG/2ML IJ SOLN
4.0000 mg | Freq: Once | INTRAMUSCULAR | Status: AC
  Administered 2021-09-03: 4 mg via INTRAVENOUS
  Filled 2021-09-03: qty 2

## 2021-09-03 MED ORDER — HEPARIN SOD (PORK) LOCK FLUSH 100 UNIT/ML IV SOLN
500.0000 [IU] | Freq: Once | INTRAVENOUS | Status: AC | PRN
  Administered 2021-09-03: 500 [IU]

## 2021-09-03 NOTE — Patient Instructions (Signed)
[facility]  Discharge Instructions: Thank you for choosing [facility] to provide your oncology and hematology care.   If you have a lab appointment with the Cancer Center, please go directly to the Cancer Center and check in at the registration area.   Wear comfortable clothing and clothing appropriate for easy access to any Portacath or PICC line.   We strive to give you quality time with your provider. You may need to reschedule your appointment if you arrive late (15 or more minutes).  Arriving late affects you and other patients whose appointments are after yours.  Also, if you miss three or more appointments without notifying the office, you may be dismissed from the clinic at the provider's discretion.      For prescription refill requests, have your pharmacy contact our office and allow 72 hours for refills to be completed.    Today you received the following chemotherapy and/or immunotherapy agents: Kyprolis    To help prevent nausea and vomiting after your treatment, we encourage you to take your nausea medication as directed.  BELOW ARE SYMPTOMS THAT SHOULD BE REPORTED IMMEDIATELY: . *FEVER GREATER THAN 100.4 F (38 C) OR HIGHER . *CHILLS OR SWEATING . *NAUSEA AND VOMITING THAT IS NOT CONTROLLED WITH YOUR NAUSEA MEDICATION . *UNUSUAL SHORTNESS OF BREATH . *UNUSUAL BRUISING OR BLEEDING . *URINARY PROBLEMS (pain or burning when urinating, or frequent urination) . *BOWEL PROBLEMS (unusual diarrhea, constipation, pain near the anus) . TENDERNESS IN MOUTH AND THROAT WITH OR WITHOUT PRESENCE OF ULCERS (sore throat, sores in mouth, or a toothache) . UNUSUAL RASH, SWELLING OR PAIN  . UNUSUAL VAGINAL DISCHARGE OR ITCHING   Items with * indicate a potential emergency and should be followed up as soon as possible or go to the Emergency Department if any problems should occur.  Please show the CHEMOTHERAPY ALERT CARD or IMMUNOTHERAPY ALERT  CARD at check-in to the Emergency Department and triage nurse.  Should you have questions after your visit or need to cancel or reschedule your appointment, please contact [facility]  Dept: [phone]  and follow the prompts.  Office hours are 8:00 a.m. to 4:30 p.m. Monday - Friday. Please note that voicemails left after 4:00 p.m. may not be returned until the following business day.  We are closed weekends and major holidays. You have access to a nurse at all times for urgent questions. Please call the main number to the clinic Dept: [phone] and follow the prompts.   For any non-urgent questions, you may also contact your provider using MyChart. We now offer e-Visits for anyone 18 and older to request care online for non-urgent symptoms. For details visit [email or web address].   Also download the MyChart app! Go to the app store, search "MyChart", open the app, select [facility], and log in with your MyChart username and password.  Due to Covid, a mask is required upon entering the hospital/clinic. If you do not have a mask, one will be given to you upon arrival. For doctor visits, patients may have 1 support person aged 18 or older with them. For treatment visits, patients cannot have anyone with them due to current Covid guidelines and our immunocompromised population.   

## 2021-09-03 NOTE — Progress Notes (Signed)
Megan Olson   [phone] [phone]   Clinic Follow up Note   Patient Care Team: [name], MD as PCP - General (Family Medicine) [name], MD as Consulting Physician (Hematology) Bond, [name], MD as Referring Physician (Ophthalmology) [name], [name], MD as Consulting Physician (Hematology and Oncology) [name], MD as Consulting Physician (Internal Medicine) [name], MD as Consulting Physician (General Surgery) 09/03/2021  CHIEF COMPLAINT: f/u for continued management of multiple myeloma  DOS .08/20/2021  SUMMARY OF ONCOLOGIC HISTORY: Oncology History  Multiple myeloma not having achieved remission ([place])  12/12/2018 Initial Diagnosis   Multiple myeloma not having achieved remission ([place])   12/20/2018 - 10/04/2019 Chemotherapy   The patient had dexamethasone (DECADRON) tablet 20 mg, 20 mg (100 % of original dose 20 mg), Oral, Once, 14 of 14 cycles Dose modification: 20 mg (original dose 20 mg, Cycle 1), 10 mg (original dose 20 mg, Cycle 14) Administration: 20 mg (12/20/2018), 20 mg (12/27/2018), 20 mg (01/10/2019), 20 mg (01/17/2019), 20 mg (01/31/2019), 20 mg (02/07/2019), 20 mg (03/14/2019), 20 mg (03/21/2019), 20 mg (02/21/2019), 20 mg (02/28/2019), 20 mg (04/04/2019), 20 mg (04/11/2019), 20 mg (04/25/2019), 20 mg (05/02/2019), 20 mg (05/16/2019), 20 mg (05/23/2019), 20 mg (06/06/2019), 20 mg (06/13/2019), 20 mg (06/27/2019), 20 mg (07/04/2019), 20 mg (07/18/2019), 20 mg (07/25/2019), 20 mg (08/08/2019), 20 mg (08/15/2019), 20 mg (08/29/2019), 20 mg (09/05/2019), 10 mg (09/19/2019) lenalidomide (REVLIMID) 15 MG capsule, 1 of 1 cycle, Start date: 01/18/2019, End date: 02/21/2019 bortezomib SQ (VELCADE) chemo injection 2 mg, 1.3 mg/m2 = 2 mg, Subcutaneous,  Once, 14 of 14 cycles Administration: 2 mg (12/20/2018), 2 mg (12/27/2018), 2 mg (12/23/2018), 2 mg (12/30/2018), 2 mg (01/10/2019), 2 mg (01/17/2019), 2 mg (01/13/2019), 2 mg  (01/20/2019), 2 mg (01/31/2019), 2 mg (02/07/2019), 2 mg (02/03/2019), 2 mg (02/10/2019), 2 mg (03/14/2019), 2 mg (03/17/2019), 2 mg (03/21/2019), 2 mg (03/24/2019), 2 mg (02/21/2019), 2 mg (02/24/2019), 2 mg (02/28/2019), 2 mg (03/03/2019), 2 mg (04/04/2019), 2 mg (04/06/2019), 2 mg (04/11/2019), 2 mg (04/14/2019), 2 mg (04/25/2019), 2 mg (04/28/2019), 2 mg (05/02/2019), 2 mg (05/05/2019), 2 mg (05/16/2019), 2 mg (05/19/2019), 2 mg (05/23/2019), 2 mg (05/26/2019), 2 mg (06/06/2019), 2 mg (06/09/2019), 2 mg (06/13/2019), 2 mg (06/16/2019), 2 mg (06/27/2019), 2 mg (06/30/2019), 2 mg (07/04/2019), 2 mg (07/07/2019), 2 mg (07/18/2019), 2 mg (07/21/2019), 2 mg (07/25/2019), 2 mg (07/28/2019), 2 mg (08/08/2019), 2 mg (08/11/2019), 2 mg (08/15/2019), 2 mg (08/18/2019), 2 mg (08/29/2019), 2 mg (09/01/2019), 2 mg (09/05/2019), 2 mg (09/08/2019), 2 mg (09/19/2019), 2 mg (10/04/2019)   for chemotherapy treatment.     Multiple myeloma in relapse ([place])  04/16/2021 Initial Diagnosis   Multiple myeloma in relapse ([place])   04/30/2021 -  Chemotherapy   Patient is on Treatment Plan : MYELOMA RELAPSED/REFRACTORY KCd q28d       CURRENT THERAPY: Kyprolis and dexa started on 04/30/2021  INTERVAL HISTORY:  Megan Olson returns for f/u for continued management of her multiple myeloma on with cycle 5-day 1 of carfilzomib dexamethasone treatment . She notes no acute new symptoms  She notes grade 1 fatigue.  Chronic unchanged back pain controlled with her current pain medications. No infection issues. no fevers no chills no night sweats no unexpected weight loss. No new tingling or numbness in her hands or feet. Labs today 08/20/2021 show hemoglobin of 11 and a WBC count of 4.2k and platelets of 304k CMP stable creatinine 0.66 calcium of 8.8  Last  labs from 08/06/2021 show M spike of 0.4 g/dL which is stable kappa lambda free light chains are within normal limits.  .10 Point review of Systems was done is negative except as noted above.  MEDICAL HISTORY:  Past  Medical History:  Diagnosis Date   Anemia    Glaucoma    HYPERTENSION 03/11/2007   Multiple myeloma ([place])    OSTEOPENIA 03/11/2007   Pre-diabetes    Thyroid cancer ([place])    Thyroid disease    Tubular adenoma of colon 07/2015    SURGICAL HISTORY: Past Surgical History:  Procedure Laterality Date   BONE MARROW BIOPSY     multiple   BREAST EXCISIONAL BIOPSY Right 2000   BREAST LUMPECTOMY  1990   benign   CATARACT EXTRACTION Bilateral 2018   DILATION AND CURETTAGE OF UTERUS     bleeding at menopause. No uterine cancer   IR IMAGING GUIDED PORT INSERTION  05/16/2021   IR RADIOLOGIST EVAL & MGMT  12/13/2018   THYROIDECTOMY N/A 08/02/2020   Procedure: TOTAL THYROIDECTOMY;  Surgeon: [name], MD;  Location: [facility];  Service: General;  Laterality: N/A;   TONSILLECTOMY     age 65    I have reviewed the social history and family history with the patient and they are unchanged from previous note.  ALLERGIES:  is allergic to ace inhibitors, benadryl [diphenhydramine], diamox [acetazolamide], sulfamethoxazole, lenalidomide, and penicillins.  MEDICATIONS:  Current Outpatient Medications  Medication Sig Dispense Refill   acyclovir (ZOVIRAX) 400 MG tablet Take 1 tablet (400 mg total) by mouth 2 (two) times daily. 60 tablet 3   ALPHAGAN P 0.1 % SOLN Place 1 drop into both eyes in the morning, at noon, and at bedtime.      amLODipine (NORVASC) 2.5 MG tablet Take 1 tablet (2.5 mg total) by mouth daily. Take along with $RemoveBe'5mg'YYFOYDQjy$  tablet. 90 tablet 3   amLODipine (NORVASC) 5 MG tablet Take 1 tablet (5 mg total) by mouth daily. Take along with 2.$RemoveBefo'5mg'aouaUImHpQt$  tablet. 90 tablet 3   aspirin EC 81 MG tablet Take 81 mg by mouth daily.     bimatoprost (LUMIGAN) 0.03 % ophthalmic solution Place 1 drop into both eyes at bedtime.      Cholecalciferol (VITAMIN D3) 125 MCG (5000 UT) TABS Take 5,000 Units by mouth daily.     Co-Enzyme Q-10 100 MG CAPS Take 100 mg by mouth daily.     dexamethasone (DECADRON) 4 MG tablet  Take 5 tablets ($RemoveBe'20mg'KwbbWaQwm$ ) on day 22 of each cycle. Repeat every 28 days.  Take with breakfast. 12 tablet 3   dorzolamide-timolol (COSOPT) 22.3-6.8 MG/ML ophthalmic solution Place 1 drop into both eyes 2 (two) times daily.   11   hydrOXYzine (ATARAX/VISTARIL) 25 MG tablet Take 1 tablet (25 mg total) by mouth 3 (three) times daily as needed. 30 tablet 0   levothyroxine (SYNTHROID) 88 MCG tablet Take 1 tablet (88 mcg total) by mouth daily before breakfast. 45 tablet 5   lidocaine-prilocaine (EMLA) cream Apply 1 application topically as needed. Apply prior to port access. 30 g 0   magnesium chloride (SLOW-MAG) 64 MG TBEC SR tablet Take by mouth.     Multiple Vitamins-Minerals (MULTIVITAMIN WITH MINERALS) tablet Take 1 tablet by mouth daily. Centrum Silver 50 +     Omega-3 1000 MG CAPS Take 1,000 mg by mouth daily.      ondansetron (ZOFRAN) 8 MG tablet Take 8 mg by mouth 30 to 60 min prior to Cytoxan administration then take 8  mg twice daily as needed for nausea and vomiting. 30 tablet 1   oxyCODONE-acetaminophen (PERCOCET) 5-325 MG tablet Take 1-2 tablets by mouth every 4 (four) hours as needed for moderate pain or severe pain. 90 tablet 0   polyethylene glycol (MIRALAX) packet Take 17 g by mouth daily. 30 each 1   prochlorperazine (COMPAZINE) 10 MG tablet Take 1 tablet (10 mg total) by mouth every 6 (six) hours as needed (Nausea or vomiting). 30 tablet 1   senna-docusate (SENOKOT-S) 8.6-50 MG tablet Take 2 tablets by mouth at bedtime. 60 tablet 2   Simethicone (GAS-X PO) Take 1 tablet by mouth as needed.     vitamin C (ASCORBIC ACID) 500 MG tablet Take 500 mg by mouth 2 (two) times a week.      amLODipine (NORVASC) 5 MG tablet Take 1 tablet (5 mg total) by mouth at bedtime. 90 tablet 3   fentaNYL (DURAGESIC) 12 MCG/HR Place 1 patch onto the skin every 3 (three) days. 10 patch 0   No current facility-administered medications for this visit.   Facility-Administered Medications Ordered in Other Visits   Medication Dose Route Frequency Provider Last Rate Last Admin   sodium chloride flush (NS) 0.9 % injection 10 mL  10 mL Intravenous PRN [name], MD   10 mL at 09/03/21 3267    PHYSICAL EXAMINATION: ECOG PERFORMANCE STATUS: 1 - Symptomatic but completely ambulatory  Vitals:   08/20/21 1055  BP: (!) 187/91  Pulse: 69  Resp: 18  Temp: 99 F (37.2 C)  SpO2: 99%    Filed Weights   08/20/21 1055  Weight: 104 lb 9.6 oz (47.4 kg)  . GENERAL:alert, in no acute distress and comfortable SKIN: no acute rashes, no significant lesions EYES: conjunctiva are pink and non-injected, sclera anicteric OROPHARYNX: MMM, no exudates, no oropharyngeal erythema or ulceration NECK: supple, no JVD LYMPH:  no palpable lymphadenopathy in the cervical, axillary or inguinal regions LUNGS: clear to auscultation b/l with normal respiratory effort HEART: regular rate & rhythm ABDOMEN:  normoactive bowel sounds , non tender, not distended. Extremity: no pedal edema PSYCH: alert & oriented x 3 with fluent speech NEURO: no focal motor/sensory deficits   LABORATORY DATA:  I have reviewed the data as listed CBC Latest Ref Rng & Units 09/03/2021 08/27/2021 08/20/2021  WBC 4.0 - 10.5 K/uL 4.8 5.2 4.2  Hemoglobin 12.0 - 15.0 g/dL 10.9(L) 10.8(L) 11.0(L)  Hematocrit 36.0 - 46.0 % 32.4(L) 32.4(L) 34.0(L)  Platelets 150 - 400 K/uL 264 170 304   CMP Latest Ref Rng & Units 08/27/2021 08/20/2021 08/06/2021  Glucose 70 - 99 mg/dL 103(H) 95 99  BUN 8 - 23 mg/dL $Remove'13 12 12  'fcsdUdV$ Creatinine 0.44 - 1.00 mg/dL 0.63 0.66 0.62  Sodium 135 - 145 mmol/L 140 140 140  Potassium 3.5 - 5.1 mmol/L 3.9 4.0 4.1  Chloride 98 - 111 mmol/L 109 108 108  CO2 22 - 32 mmol/L $RemoveB'25 25 24  'wcVcejKl$ Calcium 8.9 - 10.3 mg/dL 8.8(L) 8.8(L) 8.8(L)  Total Protein 6.5 - 8.1 g/dL 6.3(L) 6.4(L) 6.4(L)  Total Bilirubin 0.3 - 1.2 mg/dL 0.4 0.4 0.5  Alkaline Phos 38 - 126 U/L 38 35(L) 42  AST 15 - 41 U/L 12(L) 15 13(L)  ALT 0 - 44 U/L $Remo'12 12 13       'LFrZy$ RADIOGRAPHIC STUDIES: I have personally reviewed the radiological images as listed and agreed with the findings in the report. No results found.   ASSESSMENT & PLAN:   80 yo  female  1. 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  She achieved complete response with negative M protein and negative PET scan in December 2020. -She subsequently went on Ninlaro maintenance therapy  PET scan fromAugust 4, 2022, which showed focal activity within the right femur, consistent with active multiple myeloma, no other hypermetabolic lesion or plasmacytoma.  05/06/2021 bone marrow biopsy, which showed 3% plasma cells. PLAN -patient is here for f/u prior to C5D1 of Kd - no significant treatment toxicities -Labs today 08/20/2021 show hemoglobin of 11 and a WBC count of 4.2k and platelets of 304k CMP stable creatinine 0.66 calcium of 8.8 Last labs from 08/06/2021 show M spike of 0.4 g/dL which is stable kappa lambda free light chains are within normal limits. -We discussed option to treat her myeloma more aggressively by adding third medication in addition to her daily to try to get to complete remission prior to switching to maintenance treatment versus continuing current course of carfilzomib for additional few cycles till there is plateaued effect and then potentially switching to maintenance carfilzomib every 2 weeks. -Patient wants to think about this option and will let us know -Patient is appropriate to proceed with cycle 5-day 1 of carfilzomib today -Continue Zometa every 2 months  2. Mild anemia -secondary to chemo   Follow-up Please schedule next 2 cycles of carfilzomib as per orders Port flush and labs with each treatment Continue Zometa every 8 weeks please schedule next 3 treatments MD visit with C6D1 and  C7D1  . The total time spent in the appointment was 30 minutes and more than 50% was on counseling and direct patient cares.    Sullivan Lone MD Megan Hematology/Oncology Physician [facility]  .

## 2021-09-16 ENCOUNTER — Other Ambulatory Visit: Payer: Self-pay

## 2021-09-16 DIAGNOSIS — C9002 Multiple myeloma in relapse: Secondary | ICD-10-CM

## 2021-09-17 ENCOUNTER — Inpatient Hospital Stay: Payer: Medicare HMO

## 2021-09-17 ENCOUNTER — Inpatient Hospital Stay: Payer: Medicare HMO | Attending: Hematology

## 2021-09-17 ENCOUNTER — Inpatient Hospital Stay (HOSPITAL_BASED_OUTPATIENT_CLINIC_OR_DEPARTMENT_OTHER): Payer: Medicare HMO | Admitting: Hematology

## 2021-09-17 ENCOUNTER — Other Ambulatory Visit: Payer: Self-pay | Admitting: Hematology

## 2021-09-17 ENCOUNTER — Other Ambulatory Visit: Payer: Self-pay

## 2021-09-17 VITALS — BP 155/78 | HR 63 | Temp 98.4°F | Resp 18 | Wt 104.2 lb

## 2021-09-17 DIAGNOSIS — Z7982 Long term (current) use of aspirin: Secondary | ICD-10-CM | POA: Diagnosis not present

## 2021-09-17 DIAGNOSIS — C9000 Multiple myeloma not having achieved remission: Secondary | ICD-10-CM

## 2021-09-17 DIAGNOSIS — Z79899 Other long term (current) drug therapy: Secondary | ICD-10-CM | POA: Diagnosis not present

## 2021-09-17 DIAGNOSIS — Z85850 Personal history of malignant neoplasm of thyroid: Secondary | ICD-10-CM | POA: Insufficient documentation

## 2021-09-17 DIAGNOSIS — T451X5A Adverse effect of antineoplastic and immunosuppressive drugs, initial encounter: Secondary | ICD-10-CM | POA: Diagnosis not present

## 2021-09-17 DIAGNOSIS — Z7189 Other specified counseling: Secondary | ICD-10-CM

## 2021-09-17 DIAGNOSIS — Z5112 Encounter for antineoplastic immunotherapy: Secondary | ICD-10-CM | POA: Insufficient documentation

## 2021-09-17 DIAGNOSIS — C9002 Multiple myeloma in relapse: Secondary | ICD-10-CM

## 2021-09-17 DIAGNOSIS — Z7952 Long term (current) use of systemic steroids: Secondary | ICD-10-CM | POA: Insufficient documentation

## 2021-09-17 DIAGNOSIS — D6481 Anemia due to antineoplastic chemotherapy: Secondary | ICD-10-CM | POA: Diagnosis not present

## 2021-09-17 DIAGNOSIS — M8580 Other specified disorders of bone density and structure, unspecified site: Secondary | ICD-10-CM | POA: Diagnosis not present

## 2021-09-17 DIAGNOSIS — Z95828 Presence of other vascular implants and grafts: Secondary | ICD-10-CM

## 2021-09-17 DIAGNOSIS — Z5111 Encounter for antineoplastic chemotherapy: Secondary | ICD-10-CM

## 2021-09-17 LAB — CMP (CANCER CENTER ONLY)
ALT: 12 U/L (ref 0–44)
AST: 15 U/L (ref 15–41)
Albumin: 3.9 g/dL (ref 3.5–5.0)
Alkaline Phosphatase: 33 U/L — ABNORMAL LOW (ref 38–126)
Anion gap: 7 (ref 5–15)
BUN: 12 mg/dL (ref 8–23)
CO2: 25 mmol/L (ref 22–32)
Calcium: 8.7 mg/dL — ABNORMAL LOW (ref 8.9–10.3)
Chloride: 109 mmol/L (ref 98–111)
Creatinine: 0.62 mg/dL (ref 0.44–1.00)
GFR, Estimated: >60 mL/min (ref >60)
Glucose, Bld: 104 mg/dL — ABNORMAL HIGH (ref 70–99)
Potassium: 4 mmol/L (ref 3.5–5.1)
Sodium: 141 mmol/L (ref 135–145)
Total Bilirubin: 0.6 mg/dL (ref 0.3–1.2)
Total Protein: 6.3 g/dL — ABNORMAL LOW (ref 6.5–8.1)

## 2021-09-17 LAB — CBC WITH DIFFERENTIAL/PLATELET
Abs Immature Granulocytes: 0.01 10*3/uL (ref 0.00–0.07)
Basophils Absolute: 0.1 10*3/uL (ref 0.0–0.1)
Basophils Relative: 1 %
Eosinophils Absolute: 0.2 10*3/uL (ref 0.0–0.5)
Eosinophils Relative: 3 %
HCT: 34.6 % — ABNORMAL LOW (ref 36.0–46.0)
Hemoglobin: 11.1 g/dL — ABNORMAL LOW (ref 12.0–15.0)
Immature Granulocytes: 0 %
Lymphocytes Relative: 6 %
Lymphs Abs: 0.4 10*3/uL — ABNORMAL LOW (ref 0.7–4.0)
MCH: 34.4 pg — ABNORMAL HIGH (ref 26.0–34.0)
MCHC: 32.1 g/dL (ref 30.0–36.0)
MCV: 107.1 fL — ABNORMAL HIGH (ref 80.0–100.0)
Monocytes Absolute: 0.7 10*3/uL (ref 0.1–1.0)
Monocytes Relative: 10 %
Neutro Abs: 5.6 10*3/uL (ref 1.7–7.7)
Neutrophils Relative %: 80 %
Platelets: 291 10*3/uL (ref 150–400)
RBC: 3.23 MIL/uL — ABNORMAL LOW (ref 3.87–5.11)
RDW: 13 % (ref 11.5–15.5)
WBC: 7 10*3/uL (ref 4.0–10.5)
nRBC: 0 % (ref 0.0–0.2)

## 2021-09-17 MED ORDER — SODIUM CHLORIDE 0.9 % IV SOLN
10.0000 mg | Freq: Once | INTRAVENOUS | Status: AC
  Administered 2021-09-17: 10:00:00 10 mg via INTRAVENOUS
  Filled 2021-09-17: qty 10

## 2021-09-17 MED ORDER — OXYCODONE-ACETAMINOPHEN 5-325 MG PO TABS: 1.0000 | ORAL_TABLET | ORAL | 0 refills | Status: DC | PRN

## 2021-09-17 MED ORDER — DEXTROSE 5 % IV SOLN
56.0000 mg/m2 | Freq: Once | INTRAVENOUS | Status: AC
  Administered 2021-09-17: 11:00:00 80 mg via INTRAVENOUS
  Filled 2021-09-17: qty 30

## 2021-09-17 MED ORDER — SODIUM CHLORIDE 0.9% FLUSH
10.0000 mL | INTRAVENOUS | Status: DC | PRN
  Administered 2021-09-17: 09:00:00 10 mL via INTRAVENOUS

## 2021-09-17 MED ORDER — ONDANSETRON HCL 4 MG/2ML IJ SOLN
4.0000 mg | Freq: Once | INTRAMUSCULAR | Status: AC
  Administered 2021-09-17: 10:00:00 4 mg via INTRAVENOUS
  Filled 2021-09-17: qty 2

## 2021-09-17 MED ORDER — SODIUM CHLORIDE 0.9% FLUSH
10.0000 mL | INTRAVENOUS | Status: DC | PRN
  Administered 2021-09-17: 12:00:00 10 mL

## 2021-09-17 MED ORDER — FENTANYL 12 MCG/HR TD PT72: 1.0000 | MEDICATED_PATCH | TRANSDERMAL | 0 refills | Status: DC

## 2021-09-17 MED ORDER — ACETAMINOPHEN 500 MG PO TABS
1000.0000 mg | ORAL_TABLET | Freq: Once | ORAL | Status: AC
  Administered 2021-09-17: 10:00:00 1000 mg via ORAL
  Filled 2021-09-17: qty 2

## 2021-09-17 MED ORDER — HEPARIN SOD (PORK) LOCK FLUSH 100 UNIT/ML IV SOLN
500.0000 [IU] | Freq: Once | INTRAVENOUS | Status: AC | PRN
  Administered 2021-09-17: 12:00:00 500 [IU]

## 2021-09-17 MED ORDER — ZOLEDRONIC ACID 4 MG/100ML IV SOLN
4.0000 mg | Freq: Once | INTRAVENOUS | Status: AC
  Administered 2021-09-17: 11:00:00 4 mg via INTRAVENOUS
  Filled 2021-09-17: qty 100

## 2021-09-17 MED ORDER — SODIUM CHLORIDE 0.9 % IV SOLN: Freq: Once | INTRAVENOUS | Status: AC

## 2021-09-17 MED ORDER — SODIUM CHLORIDE 0.9 % IV SOLN: Freq: Once | INTRAVENOUS | Status: DC

## 2021-09-17 NOTE — Patient Instructions (Signed)
[facility] MEDICAL ONCOLOGY  Discharge Instructions: Thank you for choosing [facility] to provide your oncology and hematology care.   If you have a lab appointment with the Cancer Center, please go directly to the Cancer Center and check in at the registration area.   Wear comfortable clothing and clothing appropriate for easy access to any Portacath or PICC line.   We strive to give you quality time with your provider. You may need to reschedule your appointment if you arrive late (15 or more minutes).  Arriving late affects you and other patients whose appointments are after yours.  Also, if you miss three or more appointments without notifying the office, you may be dismissed from the clinic at the provider's discretion.      For prescription refill requests, have your pharmacy contact our office and allow 72 hours for refills to be completed.    Today you received the following chemotherapy and/or immunotherapy agents kyprolis      To help prevent nausea and vomiting after your treatment, we encourage you to take your nausea medication as directed.  BELOW ARE SYMPTOMS THAT SHOULD BE REPORTED IMMEDIATELY: . *FEVER GREATER THAN 100.4 F (38 C) OR HIGHER . *CHILLS OR SWEATING . *NAUSEA AND VOMITING THAT IS NOT CONTROLLED WITH YOUR NAUSEA MEDICATION . *UNUSUAL SHORTNESS OF BREATH . *UNUSUAL BRUISING OR BLEEDING . *URINARY PROBLEMS (pain or burning when urinating, or frequent urination) . *BOWEL PROBLEMS (unusual diarrhea, constipation, pain near the anus) . TENDERNESS IN MOUTH AND THROAT WITH OR WITHOUT PRESENCE OF ULCERS (sore throat, sores in mouth, or a toothache) . UNUSUAL RASH, SWELLING OR PAIN  . UNUSUAL VAGINAL DISCHARGE OR ITCHING   Items with * indicate a potential emergency and should be followed up as soon as possible or go to the Emergency Department if any problems should occur.  Please show the CHEMOTHERAPY ALERT CARD or IMMUNOTHERAPY ALERT  CARD at check-in to the Emergency Department and triage nurse.  Should you have questions after your visit or need to cancel or reschedule your appointment, please contact [facility]ICAL [facility]COL[facility]  Dept: [phone]  and follow the prompts.  Office hours are 8:00 a.m. to 4:30 p.m. Monday - Friday. Please note that voicemails left after 4:00 p.m. may not be returned until the following business day.  We are closed weekends and major holidays. You have access to a nurse at all times for urgent questions. Please call the main number to the clinic Dept: [phone] and follow the prompts.   For any non-urgent questions, you may also contact your provider using MyChart. We now offer e-Visits for anyone 18 and older to request care online for non-urgent symptoms. For details visit [email or web address].   Also download the MyChart app! Go to the app store, search "MyChart", open the app, select , and log in with your MyChart username and password.  Due to Covid, a mask is required upon entering the hospital/clinic. If you do not have a mask, one will be given to you upon arrival. For doctor visits, patients may have 1 support person aged 18 or older with them. For treatment visits, patients cannot have anyone with them due to current Covid guidelines and our immunocompromised population.   

## 2021-09-24 ENCOUNTER — Inpatient Hospital Stay: Payer: Medicare HMO

## 2021-09-24 ENCOUNTER — Other Ambulatory Visit: Payer: Self-pay

## 2021-09-24 VITALS — BP 178/81 | HR 67 | Temp 98.2°F | Resp 17 | Wt 106.5 lb

## 2021-09-24 DIAGNOSIS — Z79899 Other long term (current) drug therapy: Secondary | ICD-10-CM | POA: Diagnosis not present

## 2021-09-24 DIAGNOSIS — C9000 Multiple myeloma not having achieved remission: Secondary | ICD-10-CM

## 2021-09-24 DIAGNOSIS — C9002 Multiple myeloma in relapse: Secondary | ICD-10-CM | POA: Diagnosis not present

## 2021-09-24 DIAGNOSIS — Z7952 Long term (current) use of systemic steroids: Secondary | ICD-10-CM | POA: Diagnosis not present

## 2021-09-24 DIAGNOSIS — M8580 Other specified disorders of bone density and structure, unspecified site: Secondary | ICD-10-CM | POA: Diagnosis not present

## 2021-09-24 DIAGNOSIS — Z7189 Other specified counseling: Secondary | ICD-10-CM

## 2021-09-24 DIAGNOSIS — Z85850 Personal history of malignant neoplasm of thyroid: Secondary | ICD-10-CM | POA: Diagnosis not present

## 2021-09-24 DIAGNOSIS — Z5111 Encounter for antineoplastic chemotherapy: Secondary | ICD-10-CM

## 2021-09-24 DIAGNOSIS — Z5112 Encounter for antineoplastic immunotherapy: Secondary | ICD-10-CM | POA: Diagnosis not present

## 2021-09-24 DIAGNOSIS — T451X5A Adverse effect of antineoplastic and immunosuppressive drugs, initial encounter: Secondary | ICD-10-CM | POA: Diagnosis not present

## 2021-09-24 DIAGNOSIS — D6481 Anemia due to antineoplastic chemotherapy: Secondary | ICD-10-CM | POA: Diagnosis not present

## 2021-09-24 DIAGNOSIS — Z7982 Long term (current) use of aspirin: Secondary | ICD-10-CM | POA: Diagnosis not present

## 2021-09-24 LAB — CBC WITH DIFFERENTIAL/PLATELET
Abs Immature Granulocytes: 0.03 10*3/uL (ref 0.00–0.07)
Basophils Absolute: 0.1 10*3/uL (ref 0.0–0.1)
Basophils Relative: 1 %
Eosinophils Absolute: 0.2 10*3/uL (ref 0.0–0.5)
Eosinophils Relative: 4 %
HCT: 33.7 % — ABNORMAL LOW (ref 36.0–46.0)
Hemoglobin: 10.7 g/dL — ABNORMAL LOW (ref 12.0–15.0)
Immature Granulocytes: 1 %
Lymphocytes Relative: 7 %
Lymphs Abs: 0.4 10*3/uL — ABNORMAL LOW (ref 0.7–4.0)
MCH: 33.9 pg (ref 26.0–34.0)
MCHC: 31.8 g/dL (ref 30.0–36.0)
MCV: 106.6 fL — ABNORMAL HIGH (ref 80.0–100.0)
Monocytes Absolute: 0.6 10*3/uL (ref 0.1–1.0)
Monocytes Relative: 10 %
Neutro Abs: 4.8 10*3/uL (ref 1.7–7.7)
Neutrophils Relative %: 77 %
Platelets: 168 10*3/uL (ref 150–400)
RBC: 3.16 MIL/uL — ABNORMAL LOW (ref 3.87–5.11)
RDW: 12.9 % (ref 11.5–15.5)
WBC: 6.1 10*3/uL (ref 4.0–10.5)
nRBC: 0 % (ref 0.0–0.2)

## 2021-09-24 LAB — CMP (CANCER CENTER ONLY)
ALT: 10 U/L (ref 0–44)
AST: 11 U/L — ABNORMAL LOW (ref 15–41)
Albumin: 4.1 g/dL (ref 3.5–5.0)
Alkaline Phosphatase: 31 U/L — ABNORMAL LOW (ref 38–126)
Anion gap: 6 (ref 5–15)
BUN: 17 mg/dL (ref 8–23)
CO2: 26 mmol/L (ref 22–32)
Calcium: 8.9 mg/dL (ref 8.9–10.3)
Chloride: 106 mmol/L (ref 98–111)
Creatinine: 0.59 mg/dL (ref 0.44–1.00)
GFR, Estimated: >60 mL/min (ref >60)
Glucose, Bld: 102 mg/dL — ABNORMAL HIGH (ref 70–99)
Potassium: 4.3 mmol/L (ref 3.5–5.1)
Sodium: 138 mmol/L (ref 135–145)
Total Bilirubin: 0.4 mg/dL (ref 0.3–1.2)
Total Protein: 6.3 g/dL — ABNORMAL LOW (ref 6.5–8.1)

## 2021-09-24 MED ORDER — DEXTROSE 5 % IV SOLN
56.0000 mg/m2 | Freq: Once | INTRAVENOUS | Status: AC
  Administered 2021-09-24: 12:00:00 80 mg via INTRAVENOUS
  Filled 2021-09-24: qty 30

## 2021-09-24 MED ORDER — SODIUM CHLORIDE 0.9 % IV SOLN
10.0000 mg | Freq: Once | INTRAVENOUS | Status: AC
  Administered 2021-09-24: 11:00:00 10 mg via INTRAVENOUS
  Filled 2021-09-24: qty 10

## 2021-09-24 MED ORDER — SODIUM CHLORIDE 0.9% FLUSH
10.0000 mL | Freq: Once | INTRAVENOUS | Status: AC | PRN
  Administered 2021-09-24: 11:00:00 10 mL

## 2021-09-24 MED ORDER — SODIUM CHLORIDE 0.9% FLUSH
10.0000 mL | INTRAVENOUS | Status: DC | PRN
  Administered 2021-09-24: 13:00:00 10 mL

## 2021-09-24 MED ORDER — HEPARIN SOD (PORK) LOCK FLUSH 100 UNIT/ML IV SOLN
500.0000 [IU] | Freq: Once | INTRAVENOUS | Status: AC | PRN
  Administered 2021-09-24: 13:00:00 500 [IU]

## 2021-09-24 MED ORDER — ACETAMINOPHEN 500 MG PO TABS
1000.0000 mg | ORAL_TABLET | Freq: Once | ORAL | Status: AC
  Administered 2021-09-24: 12:00:00 1000 mg via ORAL
  Filled 2021-09-24: qty 2

## 2021-09-24 MED ORDER — SODIUM CHLORIDE 0.9 % IV SOLN: Freq: Once | INTRAVENOUS | Status: AC

## 2021-09-24 MED ORDER — ONDANSETRON HCL 4 MG/2ML IJ SOLN
4.0000 mg | Freq: Once | INTRAMUSCULAR | Status: AC
  Administered 2021-09-24: 12:00:00 4 mg via INTRAVENOUS
  Filled 2021-09-24: qty 2

## 2021-09-25 LAB — KAPPA/LAMBDA LIGHT CHAINS
Kappa free light chain: 6.9 mg/L (ref 3.3–19.4)
Kappa, lambda light chain ratio: 0.21 — ABNORMAL LOW (ref 0.26–1.65)
Lambda free light chains: 32.4 mg/L — ABNORMAL HIGH (ref 5.7–26.3)

## 2021-10-01 ENCOUNTER — Inpatient Hospital Stay: Payer: Medicare HMO

## 2021-10-01 ENCOUNTER — Other Ambulatory Visit: Payer: Self-pay

## 2021-10-01 VITALS — BP 161/84 | HR 69 | Temp 98.4°F | Resp 18 | Wt 107.5 lb

## 2021-10-01 DIAGNOSIS — Z5112 Encounter for antineoplastic immunotherapy: Secondary | ICD-10-CM | POA: Diagnosis not present

## 2021-10-01 DIAGNOSIS — T451X5A Adverse effect of antineoplastic and immunosuppressive drugs, initial encounter: Secondary | ICD-10-CM | POA: Diagnosis not present

## 2021-10-01 DIAGNOSIS — Z7952 Long term (current) use of systemic steroids: Secondary | ICD-10-CM | POA: Diagnosis not present

## 2021-10-01 DIAGNOSIS — Z7982 Long term (current) use of aspirin: Secondary | ICD-10-CM | POA: Diagnosis not present

## 2021-10-01 DIAGNOSIS — M8580 Other specified disorders of bone density and structure, unspecified site: Secondary | ICD-10-CM | POA: Diagnosis not present

## 2021-10-01 DIAGNOSIS — Z79899 Other long term (current) drug therapy: Secondary | ICD-10-CM | POA: Diagnosis not present

## 2021-10-01 DIAGNOSIS — Z5111 Encounter for antineoplastic chemotherapy: Secondary | ICD-10-CM

## 2021-10-01 DIAGNOSIS — D6481 Anemia due to antineoplastic chemotherapy: Secondary | ICD-10-CM | POA: Diagnosis not present

## 2021-10-01 DIAGNOSIS — C9002 Multiple myeloma in relapse: Secondary | ICD-10-CM

## 2021-10-01 DIAGNOSIS — Z85850 Personal history of malignant neoplasm of thyroid: Secondary | ICD-10-CM | POA: Diagnosis not present

## 2021-10-01 DIAGNOSIS — Z7189 Other specified counseling: Secondary | ICD-10-CM

## 2021-10-01 DIAGNOSIS — Z95828 Presence of other vascular implants and grafts: Secondary | ICD-10-CM

## 2021-10-01 LAB — CMP (CANCER CENTER ONLY)
ALT: 13 U/L (ref 0–44)
AST: 13 U/L — ABNORMAL LOW (ref 15–41)
Albumin: 4 g/dL (ref 3.5–5.0)
Alkaline Phosphatase: 28 U/L — ABNORMAL LOW (ref 38–126)
Anion gap: 6 (ref 5–15)
BUN: 17 mg/dL (ref 8–23)
CO2: 27 mmol/L (ref 22–32)
Calcium: 9 mg/dL (ref 8.9–10.3)
Chloride: 106 mmol/L (ref 98–111)
Creatinine: 0.71 mg/dL (ref 0.44–1.00)
GFR, Estimated: >60 mL/min (ref >60)
Glucose, Bld: 109 mg/dL — ABNORMAL HIGH (ref 70–99)
Potassium: 4.3 mmol/L (ref 3.5–5.1)
Sodium: 139 mmol/L (ref 135–145)
Total Bilirubin: 0.4 mg/dL (ref 0.3–1.2)
Total Protein: 6.2 g/dL — ABNORMAL LOW (ref 6.5–8.1)

## 2021-10-01 LAB — CBC WITH DIFFERENTIAL/PLATELET
Abs Immature Granulocytes: 0.02 10*3/uL (ref 0.00–0.07)
Basophils Absolute: 0.1 10*3/uL (ref 0.0–0.1)
Basophils Relative: 2 %
Eosinophils Absolute: 0.4 10*3/uL (ref 0.0–0.5)
Eosinophils Relative: 9 %
HCT: 32.1 % — ABNORMAL LOW (ref 36.0–46.0)
Hemoglobin: 10.5 g/dL — ABNORMAL LOW (ref 12.0–15.0)
Immature Granulocytes: 1 %
Lymphocytes Relative: 11 %
Lymphs Abs: 0.5 10*3/uL — ABNORMAL LOW (ref 0.7–4.0)
MCH: 34.8 pg — ABNORMAL HIGH (ref 26.0–34.0)
MCHC: 32.7 g/dL (ref 30.0–36.0)
MCV: 106.3 fL — ABNORMAL HIGH (ref 80.0–100.0)
Monocytes Absolute: 0.5 10*3/uL (ref 0.1–1.0)
Monocytes Relative: 12 %
Neutro Abs: 2.9 10*3/uL (ref 1.7–7.7)
Neutrophils Relative %: 65 %
Platelets: 243 10*3/uL (ref 150–400)
RBC: 3.02 MIL/uL — ABNORMAL LOW (ref 3.87–5.11)
RDW: 13 % (ref 11.5–15.5)
WBC: 4.4 10*3/uL (ref 4.0–10.5)
nRBC: 0 % (ref 0.0–0.2)

## 2021-10-01 MED ORDER — SODIUM CHLORIDE 0.9 % IV SOLN: Freq: Once | INTRAVENOUS | Status: AC

## 2021-10-01 MED ORDER — HEPARIN SOD (PORK) LOCK FLUSH 100 UNIT/ML IV SOLN
500.0000 [IU] | Freq: Once | INTRAVENOUS | Status: AC | PRN
  Administered 2021-10-01: 14:00:00 500 [IU]

## 2021-10-01 MED ORDER — SODIUM CHLORIDE 0.9% FLUSH
10.0000 mL | INTRAVENOUS | Status: AC | PRN
  Administered 2021-10-01: 12:00:00 10 mL

## 2021-10-01 MED ORDER — SODIUM CHLORIDE 0.9% FLUSH
10.0000 mL | INTRAVENOUS | Status: DC | PRN
  Administered 2021-10-01: 14:00:00 10 mL

## 2021-10-01 MED ORDER — DEXTROSE 5 % IV SOLN
56.0000 mg/m2 | Freq: Once | INTRAVENOUS | Status: AC
  Administered 2021-10-01: 14:00:00 80 mg via INTRAVENOUS
  Filled 2021-10-01: qty 10

## 2021-10-01 MED ORDER — ONDANSETRON HCL 4 MG/2ML IJ SOLN
4.0000 mg | Freq: Once | INTRAMUSCULAR | Status: AC
  Administered 2021-10-01: 13:00:00 4 mg via INTRAVENOUS
  Filled 2021-10-01: qty 2

## 2021-10-01 MED ORDER — ACETAMINOPHEN 500 MG PO TABS
1000.0000 mg | ORAL_TABLET | Freq: Once | ORAL | Status: AC
  Administered 2021-10-01: 13:00:00 1000 mg via ORAL
  Filled 2021-10-01: qty 2

## 2021-10-01 MED ORDER — SODIUM CHLORIDE 0.9 % IV SOLN
10.0000 mg | Freq: Once | INTRAVENOUS | Status: AC
  Administered 2021-10-01: 13:00:00 10 mg via INTRAVENOUS
  Filled 2021-10-01: qty 10

## 2021-10-01 NOTE — Patient Instructions (Signed)
[facility] MEDICAL ONCOLOGY  Discharge Instructions: Thank you for choosing [facility] to provide your oncology and hematology care.   If you have a lab appointment with the Cancer Center, please go directly to the Cancer Center and check in at the registration area.   Wear comfortable clothing and clothing appropriate for easy access to any Portacath or PICC line.   We strive to give you quality time with your provider. You may need to reschedule your appointment if you arrive late (15 or more minutes).  Arriving late affects you and other patients whose appointments are after yours.  Also, if you miss three or more appointments without notifying the office, you may be dismissed from the clinic at the provider's discretion.      For prescription refill requests, have your pharmacy contact our office and allow 72 hours for refills to be completed.    Today you received the following chemotherapy and/or immunotherapy agents carfilzomib   To help prevent nausea and vomiting after your treatment, we encourage you to take your nausea medication as directed.  BELOW ARE SYMPTOMS THAT SHOULD BE REPORTED IMMEDIATELY: . *FEVER GREATER THAN 100.4 F (38 C) OR HIGHER . *CHILLS OR SWEATING . *NAUSEA AND VOMITING THAT IS NOT CONTROLLED WITH YOUR NAUSEA MEDICATION . *UNUSUAL SHORTNESS OF BREATH . *UNUSUAL BRUISING OR BLEEDING . *URINARY PROBLEMS (pain or burning when urinating, or frequent urination) . *BOWEL PROBLEMS (unusual diarrhea, constipation, pain near the anus) . TENDERNESS IN MOUTH AND THROAT WITH OR WITHOUT PRESENCE OF ULCERS (sore throat, sores in mouth, or a toothache) . UNUSUAL RASH, SWELLING OR PAIN  . UNUSUAL VAGINAL DISCHARGE OR ITCHING   Items with * indicate a potential emergency and should be followed up as soon as possible or go to the Emergency Department if any problems should occur.  Please show the CHEMOTHERAPY ALERT CARD or IMMUNOTHERAPY ALERT  CARD at check-in to the Emergency Department and triage nurse.  Should you have questions after your visit or need to cancel or reschedule your appointment, please contact [facility]ICAL ONCOL[facility]  Dept: [phone]  and follow the prompts.  Office hours are 8:00 a.m. to 4:30 p.m. Monday - Friday. Please note that voicemails left after 4:00 p.m. may not be returned until the following business day.  We are closed weekends and major holidays. You have access to a nurse at all times for urgent questions. Please call the main number to the clinic Dept: [phone] and follow the prompts.   For any non-urgent questions, you may also contact your provider using MyChart. We now offer e-Visits for anyone 18 and older to request care online for non-urgent symptoms. For details visit [email or web address].   Also download the MyChart app! Go to the app store, search "MyChart", open the app, select [facility], and log in with your MyChart username and password.  Due to Covid, a mask is required upon entering the hospital/clinic. If you do not have a mask, one will be given to you upon arrival. For doctor visits, patients may have 1 support person aged 18 or older with them. For treatment visits, patients cannot have anyone with them due to current Covid guidelines and our immunocompromised population.   

## 2021-10-02 LAB — MULTIPLE MYELOMA PANEL, SERUM
Albumin SerPl Elph-Mcnc: 3.8 g/dL (ref 2.9–4.4)
Albumin/Glob SerPl: 1.9 — ABNORMAL HIGH (ref 0.7–1.7)
Alpha 1: 0.2 g/dL (ref 0.0–0.4)
Alpha2 Glob SerPl Elph-Mcnc: 0.6 g/dL (ref 0.4–1.0)
B-Globulin SerPl Elph-Mcnc: 1.1 g/dL (ref 0.7–1.3)
Gamma Glob SerPl Elph-Mcnc: 0.2 g/dL — ABNORMAL LOW (ref 0.4–1.8)
Globulin, Total: 2.1 g/dL — ABNORMAL LOW (ref 2.2–3.9)
IgA: 19 mg/dL — ABNORMAL LOW (ref 64–422)
IgG (Immunoglobin G), Serum: 561 mg/dL — ABNORMAL LOW (ref 586–1602)
IgM (Immunoglobulin M), Srm: 16 mg/dL — ABNORMAL LOW (ref 26–217)
M Protein SerPl Elph-Mcnc: 0.3 g/dL — ABNORMAL HIGH
Total Protein ELP: 5.9 g/dL — ABNORMAL LOW (ref 6.0–8.5)

## 2021-10-06 NOTE — Progress Notes (Signed)
[facility]   [phone] [phone]   Clinic Follow up Note   Patient Care Team: [name], MD as PCP - General (Family Medicine) [name], MD as Consulting Physician (Hematology) Bond, [name], MD as Referring Physician (Ophthalmology) [name], [name], MD as Consulting Physician (Hematology and Oncology) [name], MD as Consulting Physician (Internal Medicine) [name], MD as Consulting Physician (General Surgery) 10/06/2021  CHIEF COMPLAINT: Follow-up for continued management of multiple myeloma  DOS .09/17/2021  SUMMARY OF ONCOLOGIC HISTORY: Oncology History  Multiple myeloma not having achieved remission ([place])  12/12/2018 Initial Diagnosis   Multiple myeloma not having achieved remission ([place])   12/20/2018 - 10/04/2019 Chemotherapy   The patient had dexamethasone (DECADRON) tablet 20 mg, 20 mg (100 % of original dose 20 mg), Oral, Once, 14 of 14 cycles Dose modification: 20 mg (original dose 20 mg, Cycle 1), 10 mg (original dose 20 mg, Cycle 14) Administration: 20 mg (12/20/2018), 20 mg (12/27/2018), 20 mg (01/10/2019), 20 mg (01/17/2019), 20 mg (01/31/2019), 20 mg (02/07/2019), 20 mg (03/14/2019), 20 mg (03/21/2019), 20 mg (02/21/2019), 20 mg (02/28/2019), 20 mg (04/04/2019), 20 mg (04/11/2019), 20 mg (04/25/2019), 20 mg (05/02/2019), 20 mg (05/16/2019), 20 mg (05/23/2019), 20 mg (06/06/2019), 20 mg (06/13/2019), 20 mg (06/27/2019), 20 mg (07/04/2019), 20 mg (07/18/2019), 20 mg (07/25/2019), 20 mg (08/08/2019), 20 mg (08/15/2019), 20 mg (08/29/2019), 20 mg (09/05/2019), 10 mg (09/19/2019) lenalidomide (REVLIMID) 15 MG capsule, 1 of 1 cycle, Start date: 01/18/2019, End date: 02/21/2019 bortezomib SQ (VELCADE) chemo injection 2 mg, 1.3 mg/m2 = 2 mg, Subcutaneous,  Once, 14 of 14 cycles Administration: 2 mg (12/20/2018), 2 mg (12/27/2018), 2 mg (12/23/2018), 2 mg (12/30/2018), 2 mg (01/10/2019), 2 mg (01/17/2019), 2 mg (01/13/2019), 2 mg  (01/20/2019), 2 mg (01/31/2019), 2 mg (02/07/2019), 2 mg (02/03/2019), 2 mg (02/10/2019), 2 mg (03/14/2019), 2 mg (03/17/2019), 2 mg (03/21/2019), 2 mg (03/24/2019), 2 mg (02/21/2019), 2 mg (02/24/2019), 2 mg (02/28/2019), 2 mg (03/03/2019), 2 mg (04/04/2019), 2 mg (04/06/2019), 2 mg (04/11/2019), 2 mg (04/14/2019), 2 mg (04/25/2019), 2 mg (04/28/2019), 2 mg (05/02/2019), 2 mg (05/05/2019), 2 mg (05/16/2019), 2 mg (05/19/2019), 2 mg (05/23/2019), 2 mg (05/26/2019), 2 mg (06/06/2019), 2 mg (06/09/2019), 2 mg (06/13/2019), 2 mg (06/16/2019), 2 mg (06/27/2019), 2 mg (06/30/2019), 2 mg (07/04/2019), 2 mg (07/07/2019), 2 mg (07/18/2019), 2 mg (07/21/2019), 2 mg (07/25/2019), 2 mg (07/28/2019), 2 mg (08/08/2019), 2 mg (08/11/2019), 2 mg (08/15/2019), 2 mg (08/18/2019), 2 mg (08/29/2019), 2 mg (09/01/2019), 2 mg (09/05/2019), 2 mg (09/08/2019), 2 mg (09/19/2019), 2 mg (10/04/2019)   for chemotherapy treatment.     Multiple myeloma in relapse ([place])  04/16/2021 Initial Diagnosis   Multiple myeloma in relapse ([place])   04/30/2021 -  Chemotherapy   Patient is on Treatment Plan : MYELOMA RELAPSED/REFRACTORY KCd q28d       CURRENT THERAPY: Kyprolis and dexa started on 04/30/2021  INTERVAL HISTORY:  Ms Neiswender is here for follow-up of her multiple myeloma and prior to her cycle 6-day 1 of carfilzomib dexamethasone chemotherapy. Patient notes no acute new symptoms. Grade 1 fatigue but no other new toxicities from her carfilzomib. Her M spike has been stable in the 0.3 to 0.4 g/dL range.  We discussed again conservative approach with continued carfilzomib and then switching to maintenance versus doing a triplet therapy that might try to get her back into CR. Patient wants to continue her current treatment at this time and will think about it. Labs  done today were reviewed with her  MEDICAL HISTORY:  Past Medical History:  Diagnosis Date   Anemia    Glaucoma    HYPERTENSION 03/11/2007   Multiple myeloma ([place])    OSTEOPENIA 03/11/2007   Pre-diabetes     Thyroid cancer ([place])    Thyroid disease    Tubular adenoma of colon 07/2015    SURGICAL HISTORY: Past Surgical History:  Procedure Laterality Date   BONE MARROW BIOPSY     multiple   BREAST EXCISIONAL BIOPSY Right 2000   BREAST LUMPECTOMY  1990   benign   CATARACT EXTRACTION Bilateral 2018   DILATION AND CURETTAGE OF UTERUS     bleeding at menopause. No uterine cancer   IR IMAGING GUIDED PORT INSERTION  05/16/2021   IR RADIOLOGIST EVAL & MGMT  12/13/2018   THYROIDECTOMY N/A 08/02/2020   Procedure: TOTAL THYROIDECTOMY;  Surgeon: [name], MD;  Location: [facility];  Service: General;  Laterality: N/A;   TONSILLECTOMY     age 42    I have reviewed the social history and family history with the patient and they are unchanged from previous note.  ALLERGIES:  is allergic to ace inhibitors, benadryl [diphenhydramine], diamox [acetazolamide], sulfamethoxazole, lenalidomide, and penicillins.  MEDICATIONS:  Current Outpatient Medications  Medication Sig Dispense Refill   acyclovir (ZOVIRAX) 400 MG tablet Take 1 tablet (400 mg total) by mouth 2 (two) times daily. 60 tablet 3   ALPHAGAN P 0.1 % SOLN Place 1 drop into both eyes in the morning, at noon, and at bedtime.      amLODipine (NORVASC) 2.5 MG tablet Take 1 tablet (2.5 mg total) by mouth daily. Take along with $RemoveBe'5mg'maRhMHeol$  tablet. 90 tablet 3   amLODipine (NORVASC) 5 MG tablet Take 1 tablet (5 mg total) by mouth at bedtime. 90 tablet 3   amLODipine (NORVASC) 5 MG tablet Take 1 tablet (5 mg total) by mouth daily. Take along with 2.$RemoveBefo'5mg'SDKnXwctSyZ$  tablet. 90 tablet 3   aspirin EC 81 MG tablet Take 81 mg by mouth daily.     bimatoprost (LUMIGAN) 0.03 % ophthalmic solution Place 1 drop into both eyes at bedtime.      Cholecalciferol (VITAMIN D3) 125 MCG (5000 UT) TABS Take 5,000 Units by mouth daily.     Co-Enzyme Q-10 100 MG CAPS Take 100 mg by mouth daily.     dexamethasone (DECADRON) 4 MG tablet Take 5 tablets ($RemoveBe'20mg'EzmeSmebu$ ) on day 22 of each cycle. Repeat every  28 days.  Take with breakfast. 12 tablet 3   dorzolamide-timolol (COSOPT) 22.3-6.8 MG/ML ophthalmic solution Place 1 drop into both eyes 2 (two) times daily.   11   fentaNYL (DURAGESIC) 12 MCG/HR Place 1 patch onto the skin every 3 (three) days. 10 patch 0   hydrOXYzine (ATARAX/VISTARIL) 25 MG tablet Take 1 tablet (25 mg total) by mouth 3 (three) times daily as needed. 30 tablet 0   levothyroxine (SYNTHROID) 88 MCG tablet Take 1 tablet (88 mcg total) by mouth daily before breakfast. 45 tablet 5   lidocaine-prilocaine (EMLA) cream Apply 1 application topically as needed. Apply prior to port access. 30 g 0   magnesium chloride (SLOW-MAG) 64 MG TBEC SR tablet Take by mouth.     Multiple Vitamins-Minerals (MULTIVITAMIN WITH MINERALS) tablet Take 1 tablet by mouth daily. Centrum Silver 50 +     Omega-3 1000 MG CAPS Take 1,000 mg by mouth daily.      ondansetron (ZOFRAN) 8 MG tablet Take 8 mg by mouth 30  to 60 min prior to Cytoxan administration then take 8 mg twice daily as needed for nausea and vomiting. 30 tablet 1   oxyCODONE-acetaminophen (PERCOCET) 5-325 MG tablet Take 1-2 tablets by mouth every 4 (four) hours as needed for moderate pain or severe pain. 90 tablet 0   polyethylene glycol (MIRALAX) packet Take 17 g by mouth daily. 30 each 1   prochlorperazine (COMPAZINE) 10 MG tablet Take 1 tablet (10 mg total) by mouth every 6 (six) hours as needed (Nausea or vomiting). 30 tablet 1   senna-docusate (SENOKOT-S) 8.6-50 MG tablet Take 2 tablets by mouth at bedtime. 60 tablet 2   Simethicone (GAS-X PO) Take 1 tablet by mouth as needed.     vitamin C (ASCORBIC ACID) 500 MG tablet Take 500 mg by mouth 2 (two) times a week.      No current facility-administered medications for this visit.    PHYSICAL EXAMINATION:.  VS reviewed. GENERAL:alert, in no acute distress and comfortable SKIN: no acute rashes, no significant lesions EYES: conjunctiva are pink and non-injected, sclera anicteric OROPHARYNX:  MMM, no exudates, no oropharyngeal erythema or ulceration NECK: supple, no JVD LYMPH:  no palpable lymphadenopathy in the cervical, axillary or inguinal regions LUNGS: clear to auscultation b/l with normal respiratory effort HEART: regular rate & rhythm ABDOMEN:  normoactive bowel sounds , non tender, not distended. Extremity: no pedal edema PSYCH: alert & oriented x 3 with fluent speech NEURO: no focal motor/sensory deficits    LABORATORY DATA:  I have reviewed the data as listed CBC Latest Ref Rng & Units 10/01/2021 09/24/2021 09/17/2021  WBC 4.0 - 10.5 K/uL 4.4 6.1 7.0  Hemoglobin 12.0 - 15.0 g/dL 10.5(L) 10.7(L) 11.1(L)  Hematocrit 36.0 - 46.0 % 32.1(L) 33.7(L) 34.6(L)  Platelets 150 - 400 K/uL 243 168 291   CMP Latest Ref Rng & Units 10/01/2021 09/24/2021 09/17/2021  Glucose 70 - 99 mg/dL 109(H) 102(H) 104(H)  BUN 8 - 23 mg/dL $Remove'17 17 12  'nsZUDLJ$ Creatinine 0.44 - 1.00 mg/dL 0.71 0.59 0.62  Sodium 135 - 145 mmol/L 139 138 141  Potassium 3.5 - 5.1 mmol/L 4.3 4.3 4.0  Chloride 98 - 111 mmol/L 106 106 109  CO2 22 - 32 mmol/L $RemoveB'27 26 25  'MJZiyLDm$ Calcium 8.9 - 10.3 mg/dL 9.0 8.9 8.7(L)  Total Protein 6.5 - 8.1 g/dL 6.2(L) 6.3(L) 6.3(L)  Total Bilirubin 0.3 - 1.2 mg/dL 0.4 0.4 0.6  Alkaline Phos 38 - 126 U/L 28(L) 31(L) 33(L)  AST 15 - 41 U/L 13(L) 11(L) 15  ALT 0 - 44 U/L $Remo'13 10 12      'odweD$ RADIOGRAPHIC STUDIES: I have personally reviewed the radiological images as listed and agreed with the findings in the report. No results found.   ASSESSMENT & PLAN:   81 yo female  1. 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  She achieved complete response with negative M protein and negative PET scan in December 2020. -She subsequently went on [place] maintenance therapy  PET scan fromAugust 4, 2022, which showed focal activity  within the right femur, consistent with active multiple myeloma, no other hypermetabolic lesion or plasmacytoma.  05/06/2021 bone marrow biopsy, which showed 3% plasma cells. PLAN -patient is here for f/u prior to C6D1 of Kd -Patient notes no prohibitive toxicities from her carfilzomib at this time -No symptoms suggestive of myeloma  progression. -Labs reviewed today CBC and CMP stable -Patient appropriate to continue her current dose of carfilzomib and dexamethasone. -We discussed potential triplet therapies to try to get her into complete remission before transitioning to maintenance therapy patient would like to think about this. Continue Zometa every 2 months  2. Mild anemia -secondary to chemo   Follow-up Please schedule next 2 cycles of carfilzomib as per orders Port flush and labs with each treatment Continue Zometa every 8 weeks please schedule next 3 treatments MD visit with C7D1 and C8D1   Sullivan Lone MD MS Hematology/Oncology Physician [facility]  .

## 2021-10-07 ENCOUNTER — Encounter: Payer: Self-pay | Admitting: Hematology

## 2021-10-13 ENCOUNTER — Telehealth: Payer: Self-pay

## 2021-10-13 ENCOUNTER — Other Ambulatory Visit: Payer: Self-pay

## 2021-10-13 MED ORDER — AMLODIPINE BESYLATE 10 MG PO TABS: 10.0000 mg | ORAL_TABLET | Freq: Every day | ORAL | 3 refills | Status: DC

## 2021-10-13 NOTE — Telephone Encounter (Signed)
Amlodipine 10mg  sent to pharmacy.

## 2021-10-13 NOTE — Telephone Encounter (Signed)
..   Encourage patient to contact the pharmacy for refills or they can request refills through [facility]:  08/25/2021  NEXT APPOINTMENT DATE: na  MEDICATION:  amlodipine 10mg   Is the patient out of medication?   PHARMACY:  [facility]   Let patient know to contact pharmacy at the end of the day to make sure medication is ready.  Please notify patient to allow 48-72 hours to process  CLINICAL FILLS OUT ALL BELOW:   LAST REFILL:  QTY:  REFILL DATE:    OTHER COMMENTS: Patient states to reference conversations in November in regard to medication changes.   Okay for refill?  Please advise

## 2021-10-15 ENCOUNTER — Other Ambulatory Visit: Payer: Medicare HMO

## 2021-10-15 ENCOUNTER — Ambulatory Visit: Payer: Medicare HMO

## 2021-10-15 ENCOUNTER — Inpatient Hospital Stay: Payer: Medicare HMO | Admitting: Hematology

## 2021-10-15 ENCOUNTER — Other Ambulatory Visit: Payer: Self-pay

## 2021-10-15 ENCOUNTER — Inpatient Hospital Stay: Payer: Medicare HMO

## 2021-10-15 ENCOUNTER — Inpatient Hospital Stay: Payer: Medicare HMO | Attending: Hematology

## 2021-10-15 VITALS — BP 187/80 | HR 61 | Temp 98.2°F | Resp 17 | Wt 106.5 lb

## 2021-10-15 VITALS — BP 182/82

## 2021-10-15 DIAGNOSIS — Z7982 Long term (current) use of aspirin: Secondary | ICD-10-CM | POA: Diagnosis not present

## 2021-10-15 DIAGNOSIS — C9002 Multiple myeloma in relapse: Secondary | ICD-10-CM | POA: Insufficient documentation

## 2021-10-15 DIAGNOSIS — Z85850 Personal history of malignant neoplasm of thyroid: Secondary | ICD-10-CM | POA: Diagnosis not present

## 2021-10-15 DIAGNOSIS — Z95828 Presence of other vascular implants and grafts: Secondary | ICD-10-CM

## 2021-10-15 DIAGNOSIS — E079 Disorder of thyroid, unspecified: Secondary | ICD-10-CM | POA: Insufficient documentation

## 2021-10-15 DIAGNOSIS — G35 Multiple sclerosis: Secondary | ICD-10-CM | POA: Insufficient documentation

## 2021-10-15 DIAGNOSIS — Z79899 Other long term (current) drug therapy: Secondary | ICD-10-CM | POA: Diagnosis not present

## 2021-10-15 DIAGNOSIS — I10 Essential (primary) hypertension: Secondary | ICD-10-CM | POA: Insufficient documentation

## 2021-10-15 DIAGNOSIS — M8580 Other specified disorders of bone density and structure, unspecified site: Secondary | ICD-10-CM | POA: Diagnosis not present

## 2021-10-15 DIAGNOSIS — D6481 Anemia due to antineoplastic chemotherapy: Secondary | ICD-10-CM | POA: Diagnosis not present

## 2021-10-15 DIAGNOSIS — Z7189 Other specified counseling: Secondary | ICD-10-CM

## 2021-10-15 DIAGNOSIS — Z5111 Encounter for antineoplastic chemotherapy: Secondary | ICD-10-CM

## 2021-10-15 DIAGNOSIS — C9000 Multiple myeloma not having achieved remission: Secondary | ICD-10-CM

## 2021-10-15 DIAGNOSIS — Z5112 Encounter for antineoplastic immunotherapy: Secondary | ICD-10-CM | POA: Diagnosis present

## 2021-10-15 DIAGNOSIS — Z7952 Long term (current) use of systemic steroids: Secondary | ICD-10-CM | POA: Diagnosis not present

## 2021-10-15 DIAGNOSIS — T451X5A Adverse effect of antineoplastic and immunosuppressive drugs, initial encounter: Secondary | ICD-10-CM | POA: Insufficient documentation

## 2021-10-15 LAB — CBC WITH DIFFERENTIAL/PLATELET
Abs Immature Granulocytes: 0 10*3/uL (ref 0.00–0.07)
Basophils Absolute: 0.1 10*3/uL (ref 0.0–0.1)
Basophils Relative: 2 %
Eosinophils Absolute: 0.2 10*3/uL (ref 0.0–0.5)
Eosinophils Relative: 5 %
HCT: 35.3 % — ABNORMAL LOW (ref 36.0–46.0)
Hemoglobin: 11.5 g/dL — ABNORMAL LOW (ref 12.0–15.0)
Immature Granulocytes: 0 %
Lymphocytes Relative: 9 %
Lymphs Abs: 0.4 10*3/uL — ABNORMAL LOW (ref 0.7–4.0)
MCH: 34.5 pg — ABNORMAL HIGH (ref 26.0–34.0)
MCHC: 32.6 g/dL (ref 30.0–36.0)
MCV: 106 fL — ABNORMAL HIGH (ref 80.0–100.0)
Monocytes Absolute: 0.6 10*3/uL (ref 0.1–1.0)
Monocytes Relative: 13 %
Neutro Abs: 3.2 10*3/uL (ref 1.7–7.7)
Neutrophils Relative %: 71 %
Platelets: 272 10*3/uL (ref 150–400)
RBC: 3.33 MIL/uL — ABNORMAL LOW (ref 3.87–5.11)
RDW: 12.9 % (ref 11.5–15.5)
WBC: 4.4 10*3/uL (ref 4.0–10.5)
nRBC: 0 % (ref 0.0–0.2)

## 2021-10-15 LAB — CMP (CANCER CENTER ONLY)
ALT: 11 U/L (ref 0–44)
AST: 14 U/L — ABNORMAL LOW (ref 15–41)
Albumin: 4.3 g/dL (ref 3.5–5.0)
Alkaline Phosphatase: 27 U/L — ABNORMAL LOW (ref 38–126)
Anion gap: 6 (ref 5–15)
BUN: 14 mg/dL (ref 8–23)
CO2: 27 mmol/L (ref 22–32)
Calcium: 9.2 mg/dL (ref 8.9–10.3)
Chloride: 107 mmol/L (ref 98–111)
Creatinine: 0.56 mg/dL (ref 0.44–1.00)
GFR, Estimated: >60 mL/min (ref >60)
Glucose, Bld: 106 mg/dL — ABNORMAL HIGH (ref 70–99)
Potassium: 4 mmol/L (ref 3.5–5.1)
Sodium: 140 mmol/L (ref 135–145)
Total Bilirubin: 0.5 mg/dL (ref 0.3–1.2)
Total Protein: 6.7 g/dL (ref 6.5–8.1)

## 2021-10-15 MED ORDER — SODIUM CHLORIDE 0.9 % IV SOLN: Freq: Once | INTRAVENOUS | Status: AC

## 2021-10-15 MED ORDER — ACETAMINOPHEN 500 MG PO TABS
1000.0000 mg | ORAL_TABLET | Freq: Once | ORAL | Status: AC
  Administered 2021-10-15: 1000 mg via ORAL
  Filled 2021-10-15: qty 2

## 2021-10-15 MED ORDER — SODIUM CHLORIDE 0.9 % IV SOLN
10.0000 mg | Freq: Once | INTRAVENOUS | Status: AC
  Administered 2021-10-15: 10 mg via INTRAVENOUS
  Filled 2021-10-15: qty 10

## 2021-10-15 MED ORDER — HEPARIN SOD (PORK) LOCK FLUSH 100 UNIT/ML IV SOLN
500.0000 [IU] | Freq: Once | INTRAVENOUS | Status: AC | PRN
  Administered 2021-10-15: 500 [IU]

## 2021-10-15 MED ORDER — ONDANSETRON HCL 4 MG/2ML IJ SOLN
4.0000 mg | Freq: Once | INTRAMUSCULAR | Status: AC
  Administered 2021-10-15: 4 mg via INTRAVENOUS
  Filled 2021-10-15: qty 2

## 2021-10-15 MED ORDER — SODIUM CHLORIDE 0.9% FLUSH
10.0000 mL | INTRAVENOUS | Status: AC | PRN
  Administered 2021-10-15: 10 mL

## 2021-10-15 MED ORDER — DEXTROSE 5 % IV SOLN
56.0000 mg/m2 | Freq: Once | INTRAVENOUS | Status: AC
  Administered 2021-10-15: 80 mg via INTRAVENOUS
  Filled 2021-10-15: qty 30

## 2021-10-15 MED ORDER — SODIUM CHLORIDE 0.9% FLUSH
10.0000 mL | INTRAVENOUS | Status: DC | PRN
  Administered 2021-10-15: 10 mL

## 2021-10-15 NOTE — Patient Instructions (Signed)
[facility]  Discharge Instructions: Thank you for choosing [facility] to provide your oncology and hematology care.   If you have a lab appointment with the Cancer Center, please go directly to the Cancer Center and check in at the registration area.   Wear comfortable clothing and clothing appropriate for easy access to any Portacath or PICC line.   We strive to give you quality time with your provider. You may need to reschedule your appointment if you arrive late (15 or more minutes).  Arriving late affects you and other patients whose appointments are after yours.  Also, if you miss three or more appointments without notifying the office, you may be dismissed from the clinic at the provider's discretion.      For prescription refill requests, have your pharmacy contact our office and allow 72 hours for refills to be completed.    Today you received the following chemotherapy and/or immunotherapy agents kyprolis      To help prevent nausea and vomiting after your treatment, we encourage you to take your nausea medication as directed.  BELOW ARE SYMPTOMS THAT SHOULD BE REPORTED IMMEDIATELY: . *FEVER GREATER THAN 100.4 F (38 C) OR HIGHER . *CHILLS OR SWEATING . *NAUSEA AND VOMITING THAT IS NOT CONTROLLED WITH YOUR NAUSEA MEDICATION . *UNUSUAL SHORTNESS OF BREATH . *UNUSUAL BRUISING OR BLEEDING . *URINARY PROBLEMS (pain or burning when urinating, or frequent urination) . *BOWEL PROBLEMS (unusual diarrhea, constipation, pain near the anus) . TENDERNESS IN MOUTH AND THROAT WITH OR WITHOUT PRESENCE OF ULCERS (sore throat, sores in mouth, or a toothache) . UNUSUAL RASH, SWELLING OR PAIN  . UNUSUAL VAGINAL DISCHARGE OR ITCHING   Items with * indicate a potential emergency and should be followed up as soon as possible or go to the Emergency Department if any problems should occur.  Please show the CHEMOTHERAPY ALERT CARD or IMMUNOTHERAPY ALERT  CARD at check-in to the Emergency Department and triage nurse.  Should you have questions after your visit or need to cancel or reschedule your appointment, please contact [facility]  Dept: [phone]  and follow the prompts.  Office hours are 8:00 a.m. to 4:30 p.m. Monday - Friday. Please note that voicemails left after 4:00 p.m. may not be returned until the following business day.  We are closed weekends and major holidays. You have access to a nurse at all times for urgent questions. Please call the main number to the clinic Dept: [phone] and follow the prompts.   For any non-urgent questions, you may also contact your provider using MyChart. We now offer e-Visits for anyone 18 and older to request care online for non-urgent symptoms. For details visit [email or web address].   Also download the MyChart app! Go to the app store, search "MyChart", open the app, select [facility], and log in with your MyChart username and password.  Due to Covid, a mask is required upon entering the hospital/clinic. If you do not have a mask, one will be given to you upon arrival. For doctor visits, patients may have 1 support person aged 18 or older with them. For treatment visits, patients cannot have anyone with them due to current Covid guidelines and our immunocompromised population.   

## 2021-10-15 NOTE — Progress Notes (Signed)
MD aware of BP today. PCP aware of BP as well. Pt ok with elevated BP today.

## 2021-10-20 ENCOUNTER — Other Ambulatory Visit: Payer: Self-pay

## 2021-10-20 DIAGNOSIS — C9002 Multiple myeloma in relapse: Secondary | ICD-10-CM

## 2021-10-20 DIAGNOSIS — C9000 Multiple myeloma not having achieved remission: Secondary | ICD-10-CM

## 2021-10-21 ENCOUNTER — Telehealth: Payer: Self-pay | Admitting: Hematology

## 2021-10-21 ENCOUNTER — Encounter: Payer: Self-pay | Admitting: Hematology

## 2021-10-21 MED ORDER — FENTANYL 12 MCG/HR TD PT72: 1.0000 | MEDICATED_PATCH | TRANSDERMAL | 0 refills | Status: DC

## 2021-10-21 MED FILL — Dexamethasone Sodium Phosphate Inj 100 MG/10ML: INTRAMUSCULAR | Qty: 1 | Status: AC

## 2021-10-21 NOTE — Telephone Encounter (Signed)
Left message with follow-up appointments per 11/1 los. 

## 2021-10-21 NOTE — Progress Notes (Addendum)
[place]   [phone] [phone]   Clinic Follow up Note   Patient Care Team: [name], MD as PCP - General (Family Medicine) [name], MD as Consulting Physician (Hematology) Bond, [name], MD as Referring Physician (Ophthalmology) [name], [name], MD as Consulting Physician (Hematology and Oncology) [name], MD as Consulting Physician (Internal Medicine) [name], MD as Consulting Physician (General Surgery)  DOS ..10/15/2021   CHIEF COMPLAINT:  Follow-up for continued evaluation and management of relapsed multiple myeloma and next cycle of carfilzomib treatment.    SUMMARY OF ONCOLOGIC HISTORY: Oncology History  Multiple myeloma not having achieved remission ([place])  12/12/2018 Initial Diagnosis   Multiple myeloma not having achieved remission ([place])   12/20/2018 - 10/04/2019 Chemotherapy   The patient had dexamethasone (DECADRON) tablet 20 mg, 20 mg (100 % of original dose 20 mg), Oral, Once, 14 of 14 cycles Dose modification: 20 mg (original dose 20 mg, Cycle 1), 10 mg (original dose 20 mg, Cycle 14) Administration: 20 mg (12/20/2018), 20 mg (12/27/2018), 20 mg (01/10/2019), 20 mg (01/17/2019), 20 mg (01/31/2019), 20 mg (02/07/2019), 20 mg (03/14/2019), 20 mg (03/21/2019), 20 mg (02/21/2019), 20 mg (02/28/2019), 20 mg (04/04/2019), 20 mg (04/11/2019), 20 mg (04/25/2019), 20 mg (05/02/2019), 20 mg (05/16/2019), 20 mg (05/23/2019), 20 mg (06/06/2019), 20 mg (06/13/2019), 20 mg (06/27/2019), 20 mg (07/04/2019), 20 mg (07/18/2019), 20 mg (07/25/2019), 20 mg (08/08/2019), 20 mg (08/15/2019), 20 mg (08/29/2019), 20 mg (09/05/2019), 10 mg (09/19/2019) lenalidomide (REVLIMID) 15 MG capsule, 1 of 1 cycle, Start date: 01/18/2019, End date: 02/21/2019 bortezomib SQ (VELCADE) chemo injection 2 mg, 1.3 mg/m2 = 2 mg, Subcutaneous,  Once, 14 of 14 cycles Administration: 2 mg (12/20/2018), 2 mg (12/27/2018), 2 mg (12/23/2018), 2 mg (12/30/2018),  2 mg (01/10/2019), 2 mg (01/17/2019), 2 mg (01/13/2019), 2 mg (01/20/2019), 2 mg (01/31/2019), 2 mg (02/07/2019), 2 mg (02/03/2019), 2 mg (02/10/2019), 2 mg (03/14/2019), 2 mg (03/17/2019), 2 mg (03/21/2019), 2 mg (03/24/2019), 2 mg (02/21/2019), 2 mg (02/24/2019), 2 mg (02/28/2019), 2 mg (03/03/2019), 2 mg (04/04/2019), 2 mg (04/06/2019), 2 mg (04/11/2019), 2 mg (04/14/2019), 2 mg (04/25/2019), 2 mg (04/28/2019), 2 mg (05/02/2019), 2 mg (05/05/2019), 2 mg (05/16/2019), 2 mg (05/19/2019), 2 mg (05/23/2019), 2 mg (05/26/2019), 2 mg (06/06/2019), 2 mg (06/09/2019), 2 mg (06/13/2019), 2 mg (06/16/2019), 2 mg (06/27/2019), 2 mg (06/30/2019), 2 mg (07/04/2019), 2 mg (07/07/2019), 2 mg (07/18/2019), 2 mg (07/21/2019), 2 mg (07/25/2019), 2 mg (07/28/2019), 2 mg (08/08/2019), 2 mg (08/11/2019), 2 mg (08/15/2019), 2 mg (08/18/2019), 2 mg (08/29/2019), 2 mg (09/01/2019), 2 mg (09/05/2019), 2 mg (09/08/2019), 2 mg (09/19/2019), 2 mg (10/04/2019)   for chemotherapy treatment.     Multiple myeloma in relapse ([place])  04/16/2021 Initial Diagnosis   Multiple myeloma in relapse ([place])   04/30/2021 -  Chemotherapy   Patient is on Treatment Plan : MYELOMA RELAPSED/REFRACTORY KCd q28d       CURRENT THERAPY: Kyprolis and dexa started on 04/30/2021  INTERVAL HISTORY:  Ms Risse is here for follow-up for continued evaluation management of her multiple myeloma and next dose of carfilzomib.  She notes no acute new symptoms and has been feeling well.  Good energy levels. No new toxicities from the carfilzomib.  Grade 1 fatigue. No infection issues.  No new bone pains. Patient has decided to hold off on adding Cytoxan or a third medication and would like to continue doublet treatment with carfilzomib and dexamethasone at this time.  Labs done  today were reviewed  ROS .10 Point review of Systems was done is negative except as noted above.  MEDICAL HISTORY:  Past Medical History:  Diagnosis Date   Anemia    Glaucoma    HYPERTENSION 03/11/2007   Multiple myeloma ([place])     OSTEOPENIA 03/11/2007   Pre-diabetes    Thyroid cancer ([place])    Thyroid disease    Tubular adenoma of colon 07/2015    SURGICAL HISTORY: Past Surgical History:  Procedure Laterality Date   BONE MARROW BIOPSY     multiple   BREAST EXCISIONAL BIOPSY Right 2000   BREAST LUMPECTOMY  1990   benign   CATARACT EXTRACTION Bilateral 2018   DILATION AND CURETTAGE OF UTERUS     bleeding at menopause. No uterine cancer   IR IMAGING GUIDED PORT INSERTION  05/16/2021   IR RADIOLOGIST EVAL & MGMT  12/13/2018   THYROIDECTOMY N/A 08/02/2020   Procedure: TOTAL THYROIDECTOMY;  Surgeon: [name], MD;  Location: [facility];  Service: General;  Laterality: N/A;   TONSILLECTOMY     age 34   I have reviewed the social history and family history with the patient and they are unchanged from previous note.  ALLERGIES:  is allergic to ace inhibitors, benadryl [diphenhydramine], diamox [acetazolamide], sulfamethoxazole, lenalidomide, and penicillins.  MEDICATIONS:  Current Outpatient Medications  Medication Sig Dispense Refill   acyclovir (ZOVIRAX) 400 MG tablet Take 1 tablet (400 mg total) by mouth 2 (two) times daily. 60 tablet 3   ALPHAGAN P 0.1 % SOLN Place 1 drop into both eyes in the morning, at noon, and at bedtime.      amLODipine (NORVASC) 10 MG tablet Take 1 tablet (10 mg total) by mouth daily. 90 tablet 3   aspirin EC 81 MG tablet Take 81 mg by mouth daily.     bimatoprost (LUMIGAN) 0.03 % ophthalmic solution Place 1 drop into both eyes at bedtime.      Cholecalciferol (VITAMIN D3) 125 MCG (5000 UT) TABS Take 5,000 Units by mouth daily.     Co-Enzyme Q-10 100 MG CAPS Take 100 mg by mouth daily.     dexamethasone (DECADRON) 4 MG tablet Take 5 tablets (60m) on day 22 of each cycle. Repeat every 28 days.  Take with breakfast. 12 tablet 3   dorzolamide-timolol (COSOPT) 22.3-6.8 MG/ML ophthalmic solution Place 1 drop into both eyes 2 (two) times daily.   11   fentaNYL (DURAGESIC) 12 MCG/HR Place 1  patch onto the skin every 3 (three) days. 10 patch 0   hydrOXYzine (ATARAX/VISTARIL) 25 MG tablet Take 1 tablet (25 mg total) by mouth 3 (three) times daily as needed. 30 tablet 0   levothyroxine (SYNTHROID) 88 MCG tablet Take 1 tablet (88 mcg total) by mouth daily before breakfast. 45 tablet 5   lidocaine-prilocaine (EMLA) cream Apply 1 application topically as needed. Apply prior to port access. 30 g 0   magnesium chloride (SLOW-MAG) 64 MG TBEC SR tablet Take by mouth.     Multiple Vitamins-Minerals (MULTIVITAMIN WITH MINERALS) tablet Take 1 tablet by mouth daily. Centrum Silver 50 +     Omega-3 1000 MG CAPS Take 1,000 mg by mouth daily.      ondansetron (ZOFRAN) 8 MG tablet Take 8 mg by mouth 30 to 60 min prior to Cytoxan administration then take 8 mg twice daily as needed for nausea and vomiting. 30 tablet 1   oxyCODONE-acetaminophen (PERCOCET) 5-325 MG tablet Take 1-2 tablets by mouth every 4 (four) hours  as needed for moderate pain or severe pain. 90 tablet 0   polyethylene glycol (MIRALAX) packet Take 17 g by mouth daily. 30 each 1   prochlorperazine (COMPAZINE) 10 MG tablet Take 1 tablet (10 mg total) by mouth every 6 (six) hours as needed (Nausea or vomiting). 30 tablet 1   senna-docusate (SENOKOT-S) 8.6-50 MG tablet Take 2 tablets by mouth at bedtime. 60 tablet 2   Simethicone (GAS-X PO) Take 1 tablet by mouth as needed.     vitamin C (ASCORBIC ACID) 500 MG tablet Take 500 mg by mouth 2 (two) times a week.      No current facility-administered medications for this visit.    PHYSICAL EXAMINATION:. .BP (!) 187/80 (BP Location: Left Arm, Patient Position: Sitting) Comment: Nurse aware of patient BP   Pulse 61    Temp 98.2 F (36.8 C) (Temporal)    Resp 17    Wt 106 lb 8 oz (48.3 kg)    SpO2 100%    BMI 20.13 kg/m   GENERAL:alert, in no acute distress and comfortable SKIN: no acute rashes, no significant lesions EYES: conjunctiva are pink and non-injected, sclera anicteric OROPHARYNX:  MMM, no exudates, no oropharyngeal erythema or ulceration NECK: supple, no JVD LYMPH:  no palpable lymphadenopathy in the cervical, axillary or inguinal regions LUNGS: clear to auscultation b/l with normal respiratory effort HEART: regular rate & rhythm ABDOMEN:  normoactive bowel sounds , non tender, not distended. Extremity: no pedal edema PSYCH: alert & oriented x 3 with fluent speech NEURO: no focal motor/sensory deficits    LABORATORY DATA:  I have reviewed the data as listed CBC Latest Ref Rng & Units 10/22/2021 10/15/2021 10/01/2021  WBC 4.0 - 10.5 K/uL 3.0(L) 4.4 4.4  Hemoglobin 12.0 - 15.0 g/dL 11.3(L) 11.5(L) 10.5(L)  Hematocrit 36.0 - 46.0 % 34.7(L) 35.3(L) 32.1(L)  Platelets 150 - 400 K/uL 136(L) 272 243   CMP Latest Ref Rng & Units 10/22/2021 10/15/2021 10/01/2021  Glucose 70 - 99 mg/dL 108(H) 106(H) 109(H)  BUN 8 - 23 mg/dL _0 Creatinine 0.44 - 1.00 mg/dL 0.56 0.56 0.71  Sodium 135 - 145 mmol/L 139 140 139  Potassium 3.5 - 5.1 mmol/L 4.1 4.0 4.3  Chloride 98 - 111 mmol/L 107 107 106  CO2 22 - 32 mmol/L _1 Calcium 8.9 - 10.3 mg/dL 9.1 9.2 9.0  Total Protein 6.5 - 8.1 g/dL 6.6 6.7 6.2(L)  Total Bilirubin 0.3 - 1.2 mg/dL 0.5 0.5 0.4  Alkaline Phos 38 - 126 U/L 29(L) 27(L) 28(L)  AST 15 - 41 U/L 15 14(L) 13(L)  ALT 0 - 44 U/L _2 RADIOGRAPHIC STUDIES: I have personally reviewed the radiological images as listed and agreed with the findings in the report. No results found.   ASSESSMENT & PLAN:   81 yo female  1. 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  She achieved complete response with negative M protein and negative PET scan in December 2020. -She subsequently went on N[place]o maintenance therapy  PET scan fromAugust 4, 2022, which showed focal activity within the  right femur, consistent with active multiple myeloma, no other hypermetabolic lesion or plasmacytoma.  05/06/2021 bone marrow biopsy, which showed 3% plasma cells. PLAN -Patient is here for cycle 7-day 1 of carfilzomib dexamethasone  treatment. -She has no new prohibitive toxicities from her carfilzomib.  Grade 1 fatigue. -Labs done today were reviewed with the patient CBC with a hemoglobin of 11.3, WBC count 3k platelets of 136k CMP unremarkable Last myeloma panel on 09/24/2021 shows M spike of 0.3 g/dL. -We again discussed if she would like to take a more aggressive approach and switch to triplet therapy to try to achieve CR prior to switching to maintenance therapy.  Patient wants to hold off on this approach currently. -She prefers to continue her doublet treatment with carfilzomib and dexamethasone. -She is continue to follow with her ophthalmologist to monitor and manage her glaucoma. -Continue Zometa every 2 months  2. Mild anemia -secondary to chemo   Follow-up .Please schedule next 2 cycles of carfilzomib  Port flush and labs with each treatment MD visit in 4 weeks with cycle 8-day 1   . The total time spent in the appointment was 30 minutes in reviewing medical records, reviewing the lab and toxicity check with the patient, ordering management of carfilzomib/dexamethasone chemotherapy, placing orders and documentation and discussion of goals of care and treatment options.  Sullivan Lone MD MS Hematology/Oncology Physician [facility]

## 2021-10-22 ENCOUNTER — Inpatient Hospital Stay: Payer: Medicare HMO

## 2021-10-22 ENCOUNTER — Other Ambulatory Visit: Payer: Self-pay

## 2021-10-22 VITALS — BP 142/88 | HR 61 | Temp 98.4°F | Resp 18 | Wt 106.8 lb

## 2021-10-22 DIAGNOSIS — D6481 Anemia due to antineoplastic chemotherapy: Secondary | ICD-10-CM | POA: Diagnosis not present

## 2021-10-22 DIAGNOSIS — I10 Essential (primary) hypertension: Secondary | ICD-10-CM | POA: Diagnosis not present

## 2021-10-22 DIAGNOSIS — Z85850 Personal history of malignant neoplasm of thyroid: Secondary | ICD-10-CM | POA: Diagnosis not present

## 2021-10-22 DIAGNOSIS — Z5112 Encounter for antineoplastic immunotherapy: Secondary | ICD-10-CM | POA: Diagnosis not present

## 2021-10-22 DIAGNOSIS — Z95828 Presence of other vascular implants and grafts: Secondary | ICD-10-CM

## 2021-10-22 DIAGNOSIS — G35 Multiple sclerosis: Secondary | ICD-10-CM | POA: Diagnosis not present

## 2021-10-22 DIAGNOSIS — C9002 Multiple myeloma in relapse: Secondary | ICD-10-CM

## 2021-10-22 DIAGNOSIS — E079 Disorder of thyroid, unspecified: Secondary | ICD-10-CM | POA: Diagnosis not present

## 2021-10-22 DIAGNOSIS — Z7189 Other specified counseling: Secondary | ICD-10-CM

## 2021-10-22 DIAGNOSIS — Z7952 Long term (current) use of systemic steroids: Secondary | ICD-10-CM | POA: Diagnosis not present

## 2021-10-22 DIAGNOSIS — Z7982 Long term (current) use of aspirin: Secondary | ICD-10-CM | POA: Diagnosis not present

## 2021-10-22 DIAGNOSIS — M8580 Other specified disorders of bone density and structure, unspecified site: Secondary | ICD-10-CM | POA: Diagnosis not present

## 2021-10-22 DIAGNOSIS — Z79899 Other long term (current) drug therapy: Secondary | ICD-10-CM | POA: Diagnosis not present

## 2021-10-22 DIAGNOSIS — T451X5A Adverse effect of antineoplastic and immunosuppressive drugs, initial encounter: Secondary | ICD-10-CM | POA: Diagnosis not present

## 2021-10-22 LAB — CBC WITH DIFFERENTIAL (CANCER CENTER ONLY)
Abs Immature Granulocytes: 0.01 10*3/uL (ref 0.00–0.07)
Basophils Absolute: 0 10*3/uL (ref 0.0–0.1)
Basophils Relative: 1 %
Eosinophils Absolute: 0.1 10*3/uL (ref 0.0–0.5)
Eosinophils Relative: 3 %
HCT: 34.7 % — ABNORMAL LOW (ref 36.0–46.0)
Hemoglobin: 11.3 g/dL — ABNORMAL LOW (ref 12.0–15.0)
Immature Granulocytes: 0 %
Lymphocytes Relative: 11 %
Lymphs Abs: 0.3 10*3/uL — ABNORMAL LOW (ref 0.7–4.0)
MCH: 34.7 pg — ABNORMAL HIGH (ref 26.0–34.0)
MCHC: 32.6 g/dL (ref 30.0–36.0)
MCV: 106.4 fL — ABNORMAL HIGH (ref 80.0–100.0)
Monocytes Absolute: 0.6 10*3/uL (ref 0.1–1.0)
Monocytes Relative: 20 %
Neutro Abs: 1.9 10*3/uL (ref 1.7–7.7)
Neutrophils Relative %: 65 %
Platelet Count: 136 10*3/uL — ABNORMAL LOW (ref 150–400)
RBC: 3.26 MIL/uL — ABNORMAL LOW (ref 3.87–5.11)
RDW: 13.2 % (ref 11.5–15.5)
WBC Count: 3 10*3/uL — ABNORMAL LOW (ref 4.0–10.5)
nRBC: 0 % (ref 0.0–0.2)

## 2021-10-22 LAB — CMP (CANCER CENTER ONLY)
ALT: 13 U/L (ref 0–44)
AST: 15 U/L (ref 15–41)
Albumin: 4.3 g/dL (ref 3.5–5.0)
Alkaline Phosphatase: 29 U/L — ABNORMAL LOW (ref 38–126)
Anion gap: 6 (ref 5–15)
BUN: 15 mg/dL (ref 8–23)
CO2: 26 mmol/L (ref 22–32)
Calcium: 9.1 mg/dL (ref 8.9–10.3)
Chloride: 107 mmol/L (ref 98–111)
Creatinine: 0.56 mg/dL (ref 0.44–1.00)
GFR, Estimated: >60 mL/min (ref >60)
Glucose, Bld: 108 mg/dL — ABNORMAL HIGH (ref 70–99)
Potassium: 4.1 mmol/L (ref 3.5–5.1)
Sodium: 139 mmol/L (ref 135–145)
Total Bilirubin: 0.5 mg/dL (ref 0.3–1.2)
Total Protein: 6.6 g/dL (ref 6.5–8.1)

## 2021-10-22 MED ORDER — SODIUM CHLORIDE 0.9% FLUSH
10.0000 mL | INTRAVENOUS | Status: AC | PRN
  Administered 2021-10-22: 10 mL

## 2021-10-22 MED ORDER — SODIUM CHLORIDE 0.9 % IV SOLN
10.0000 mg | Freq: Once | INTRAVENOUS | Status: AC
  Administered 2021-10-22: 10 mg via INTRAVENOUS
  Filled 2021-10-22: qty 10

## 2021-10-22 MED ORDER — SODIUM CHLORIDE 0.9 % IV SOLN: Freq: Once | INTRAVENOUS | Status: AC

## 2021-10-22 MED ORDER — HEPARIN SOD (PORK) LOCK FLUSH 100 UNIT/ML IV SOLN
500.0000 [IU] | Freq: Once | INTRAVENOUS | Status: AC | PRN
  Administered 2021-10-22: 500 [IU]

## 2021-10-22 MED ORDER — SODIUM CHLORIDE 0.9% FLUSH
10.0000 mL | INTRAVENOUS | Status: DC | PRN
  Administered 2021-10-22: 10 mL

## 2021-10-22 MED ORDER — ONDANSETRON HCL 4 MG/2ML IJ SOLN
4.0000 mg | Freq: Once | INTRAMUSCULAR | Status: AC
  Administered 2021-10-22: 4 mg via INTRAVENOUS
  Filled 2021-10-22: qty 2

## 2021-10-22 MED ORDER — DEXTROSE 5 % IV SOLN
56.0000 mg/m2 | Freq: Once | INTRAVENOUS | Status: AC
  Administered 2021-10-22: 80 mg via INTRAVENOUS
  Filled 2021-10-22: qty 30

## 2021-10-22 MED ORDER — ACETAMINOPHEN 500 MG PO TABS
1000.0000 mg | ORAL_TABLET | Freq: Once | ORAL | Status: AC
  Administered 2021-10-22: 1000 mg via ORAL
  Filled 2021-10-22: qty 2

## 2021-10-24 ENCOUNTER — Encounter: Payer: Self-pay | Admitting: Hematology

## 2021-10-24 ENCOUNTER — Telehealth: Payer: Self-pay

## 2021-10-24 ENCOUNTER — Other Ambulatory Visit: Payer: Self-pay

## 2021-10-24 ENCOUNTER — Encounter: Payer: Self-pay | Admitting: Family Medicine

## 2021-10-24 ENCOUNTER — Ambulatory Visit (INDEPENDENT_AMBULATORY_CARE_PROVIDER_SITE_OTHER): Payer: Medicare HMO | Admitting: Family Medicine

## 2021-10-24 VITALS — BP 130/78 | HR 75 | Temp 98.4°F | Ht 60.0 in | Wt 106.2 lb

## 2021-10-24 DIAGNOSIS — I10 Essential (primary) hypertension: Secondary | ICD-10-CM

## 2021-10-24 DIAGNOSIS — N390 Urinary tract infection, site not specified: Secondary | ICD-10-CM

## 2021-10-24 DIAGNOSIS — R300 Dysuria: Secondary | ICD-10-CM

## 2021-10-24 LAB — POC URINALSYSI DIPSTICK (AUTOMATED)
Bilirubin, UA: NEGATIVE
Blood, UA: POSITIVE
Glucose, UA: NEGATIVE
Ketones, UA: NEGATIVE
Leukocytes, UA: NEGATIVE
Nitrite, UA: NEGATIVE
Protein, UA: POSITIVE — AB
Spec Grav, UA: 1.01 (ref 1.010–1.025)
Urobilinogen, UA: 0.2 E.U./dL
pH, UA: 6.5 (ref 5.0–8.0)

## 2021-10-24 MED ORDER — NITROFURANTOIN MONOHYD MACRO 100 MG PO CAPS: 100.0000 mg | ORAL_CAPSULE | Freq: Two times a day (BID) | ORAL | 0 refills | Status: DC

## 2021-10-24 NOTE — Progress Notes (Signed)
Phone [phone] In person visit   Subjective:   Megan Olson is a 81 y.o. year old very pleasant female patient who presents for/with See problem oriented charting Chief Complaint  Patient presents with   Dysuria    Pt c/o dysuria Wednesday night after a chemo treatment with little urine out put. This is her first UTI ever.   burning with urination    This visit occurred during the SARS-CoV-2 public health emergency.  Safety protocols were in place, including screening questions prior to the visit, additional usage of staff PPE, and extensive cleaning of exam room while observing appropriate contact time as indicated for disinfecting solutions.   Past Medical History-  Patient Active Problem List   Diagnosis Date Noted   Papillary thyroid carcinoma ([place]) 08/01/2020    Priority: High   Left thyroid nodule 06/18/2020    Priority: High   Multiple myeloma not having achieved remission ([place]) 12/12/2018    Priority: High   Pain in thoracic spine 11/03/2018    Priority: Medium    Hyperglycemia 03/29/2015    Priority: Medium    Hyperlipidemia 10/18/2014    Priority: Medium    Essential hypertension 03/11/2007    Priority: Medium    Counseling regarding advance care planning and goals of care 12/12/2018    Priority: Low   History of adenomatous polyp of colon 07/23/2015    Priority: Low   Raynaud phenomenon 10/19/2014    Priority: Low   Syncope 10/19/2014    Priority: Low   Glaucoma 01/05/2011    Priority: Low   Osteoporosis 03/11/2007    Priority: Low   Multiple myeloma in relapse ([place]) 04/16/2021   Prediabetes 06/18/2020   Primary open angle glaucoma (POAG) of right eye, moderate stage 10/11/2017   Combined forms of age-related cataract of left eye 04/28/2017    Medications- reviewed and updated Current Outpatient Medications  Medication Sig Dispense Refill   acyclovir (ZOVIRAX) 400 MG tablet Take 1 tablet (400 mg total) by mouth 2 (two) times daily. 60 tablet 3    ALPHAGAN P 0.1 % SOLN Place 1 drop into both eyes in the morning, at noon, and at bedtime.      amLODipine (NORVASC) 10 MG tablet Take 1 tablet (10 mg total) by mouth daily. 90 tablet 3   aspirin EC 81 MG tablet Take 81 mg by mouth daily.     bimatoprost (LUMIGAN) 0.03 % ophthalmic solution Place 1 drop into both eyes at bedtime.      Cholecalciferol (VITAMIN D3) 125 MCG (5000 UT) TABS Take 5,000 Units by mouth daily.     Co-Enzyme Q-10 100 MG CAPS Take 100 mg by mouth daily.     dexamethasone (DECADRON) 4 MG tablet Take 5 tablets ($RemoveBe'20mg'LLubAzrwp$ ) on day 22 of each cycle. Repeat every 28 days.  Take with breakfast. 12 tablet 3   dorzolamide-timolol (COSOPT) 22.3-6.8 MG/ML ophthalmic solution Place 1 drop into both eyes 2 (two) times daily.   11   fentaNYL (DURAGESIC) 12 MCG/HR Place 1 patch onto the skin every 3 (three) days. 10 patch 0   hydrOXYzine (ATARAX/VISTARIL) 25 MG tablet Take 1 tablet (25 mg total) by mouth 3 (three) times daily as needed. 30 tablet 0   levothyroxine (SYNTHROID) 88 MCG tablet Take 1 tablet (88 mcg total) by mouth daily before breakfast. 45 tablet 5   lidocaine-prilocaine (EMLA) cream Apply 1 application topically as needed. Apply prior to port access. 30 g 0   magnesium chloride (SLOW-MAG)  64 MG TBEC SR tablet Take by mouth.     Multiple Vitamins-Minerals (MULTIVITAMIN WITH MINERALS) tablet Take 1 tablet by mouth daily. Centrum Silver 50 +     nitrofurantoin, macrocrystal-monohydrate, (MACROBID) 100 MG capsule Take 1 capsule (100 mg total) by mouth 2 (two) times daily. 14 capsule 0   Omega-3 1000 MG CAPS Take 1,000 mg by mouth daily.      ondansetron (ZOFRAN) 8 MG tablet Take 8 mg by mouth 30 to 60 min prior to Cytoxan administration then take 8 mg twice daily as needed for nausea and vomiting. 30 tablet 1   oxyCODONE-acetaminophen (PERCOCET) 5-325 MG tablet Take 1-2 tablets by mouth every 4 (four) hours as needed for moderate pain or severe pain. 90 tablet 0   polyethylene  glycol (MIRALAX) packet Take 17 g by mouth daily. 30 each 1   prochlorperazine (COMPAZINE) 10 MG tablet Take 1 tablet (10 mg total) by mouth every 6 (six) hours as needed (Nausea or vomiting). 30 tablet 1   senna-docusate (SENOKOT-S) 8.6-50 MG tablet Take 2 tablets by mouth at bedtime. 60 tablet 2   Simethicone (GAS-X PO) Take 1 tablet by mouth as needed.     vitamin C (ASCORBIC ACID) 500 MG tablet Take 500 mg by mouth 2 (two) times a week.      No current facility-administered medications for this visit.     Objective:  BP 130/78 Comment: most recent home reading.   Pulse 75    Temp 98.4 F (36.9 C)    Ht 5' (1.524 m)    Wt 106 lb 3.2 oz (48.2 kg)    SpO2 97%    BMI 20.74 kg/m  Gen: NAD, resting comfortably CV: RRR no murmurs rubs or gallops Lungs: CTAB no crackles, wheeze, rhonchi Ext: no edema Skin: warm, dry   Results for orders placed or performed in visit on 10/24/21 (from the past 24 hour(s))  POCT Urinalysis Dipstick (Automated)     Status: Abnormal   Collection Time: 10/24/21  4:17 PM  Result Value Ref Range   Color, UA yellow    Clarity, UA cloudy    Glucose, UA Negative Negative   Bilirubin, UA neg    Ketones, UA neg    Spec Grav, UA 1.010 1.010 - 1.025   Blood, UA positive    pH, UA 6.5 5.0 - 8.0   Protein, UA Positive (A) Negative   Urobilinogen, UA 0.2 0.2 or 1.0 E.U./dL   Nitrite, UA neg    Leukocytes, UA Negative Negative       Assessment and Plan   #Concern for UTI S: Patients symptoms started 10/22/21. Chemo on Wednesday and that night woke up and noted very little urine come out and very painful. Noted a lot of frequency trough night and low volume. Has never had a UTI. Some more fatigue but not unusual after chemo.  Complains of dysuria: yes; polyuria: yes; nocturia: more than normal; urgency: new.  Symptoms are slightly improving with increasing fluids.  ROS- no fever, chills, nausea, vomiting, flank pain. No blood in urine.  A/P: UA with potential UTI  with blood and urine. Likely UTI. Will get culture. Empiric treatment with: macrobid due to pcn and sulfa allergy Patient to follow up if new or worsening symptoms or failure to improve.  -As long as symptoms clear we will plan on getting urine microscopic at next visit to make sure possible blood has cleared  #hypertension S: medication: amlodipine 10 mg Home readings #s:  130/78 BP Readings from Last 3 Encounters:  10/24/21 (!) 154/90  10/22/21 (!) 142/88  10/15/21 (!) 182/82  A/P: most recent home reading well controlled and always is- white coat element. Doing very well on 10 mg amlodipine- continue current med   Recommended follow up: Return for as needed for new, worsening, persistent symptoms. Future Appointments  Date Time Provider [place]  10/28/2021  8:45 AM [facility] HEALTH COACH [facility]  10/29/2021  8:00 AM [facility] [place] FLUSH [facility] None  10/29/2021  9:00 AM [facility] INFUSION [facility] None  11/12/2021  8:30 AM [facility] [place] FLUSH [facility] None  11/12/2021  9:00 AM [name], MD [facility] None  11/12/2021 10:00 AM [facility] INFUSION [facility] None  11/19/2021  9:00 AM [facility] [place] FLUSH [facility] None  11/19/2021 10:00 AM [facility] INFUSION [facility] None  11/26/2021  8:30 AM [facility] [place] FLUSH [facility] None  11/26/2021  9:30 AM [facility] INFUSION [facility] None  12/29/2021  9:40 AM [name], MD [facility] None  01/07/2022  8:30 AM [facility] [place] FLUSH [facility] None  01/07/2022  9:30 AM [facility] INFUSION [facility] None  02/02/2022  9:20 AM Cordarryl Monrreal, Brayton Mars, MD [facility]    Lab/Order associations:   ICD-10-CM   1. Urinary tract infection without hematuria, site unspecified  N39.0 POCT Urinalysis Dipstick (Automated)    Urine Culture    Urine Culture    2. Essential hypertension  I10      Meds ordered this encounter  Medications   nitrofurantoin, macrocrystal-monohydrate, (MACROBID) 100 MG capsule    Sig: Take 1  capsule (100 mg total) by mouth 2 (two) times daily.    Dispense:  14 capsule    Refill:  0   I,Jada Bradford,acting as a scribe for Garret Reddish, MD.,have documented all relevant documentation on the behalf of Garret Reddish, MD,as directed by  Garret Reddish, MD while in the presence of Garret Reddish, MD.   I, Garret Reddish, MD, have reviewed all documentation for this visit. The documentation on 10/24/21 for the exam, diagnosis, procedures, and orders are all accurate and complete.   Return precautions advised.  Garret Reddish, MD

## 2021-10-24 NOTE — Telephone Encounter (Signed)
Contacted pt regarding MyChart message. Pt has had UTI s/s but saw PCP today, positive for UTI and is being treated.

## 2021-10-24 NOTE — Patient Instructions (Addendum)
Health Maintenance Due  Topic Date Due   TETANUS/TDAP - Please consider getting your tdap shot at your local pharmacy-if received, please let us know.  02/11/2020   COVID-19 Vaccine  Let us know the date of bivalent booster 03/06/2021   I hope you feel better! Antibiotic twice a day for 7 days  Recommended follow up: Return for as needed for new, worsening, persistent symptoms.

## 2021-10-25 LAB — URINE CULTURE
MICRO NUMBER:: 12898585
SPECIMEN QUALITY:: ADEQUATE

## 2021-10-27 ENCOUNTER — Other Ambulatory Visit: Payer: Medicare HMO

## 2021-10-27 ENCOUNTER — Other Ambulatory Visit: Payer: Self-pay

## 2021-10-27 ENCOUNTER — Telehealth: Payer: Self-pay | Admitting: Family Medicine

## 2021-10-27 DIAGNOSIS — R300 Dysuria: Secondary | ICD-10-CM

## 2021-10-27 DIAGNOSIS — C9000 Multiple myeloma not having achieved remission: Secondary | ICD-10-CM

## 2021-10-27 MED ORDER — ACYCLOVIR 400 MG PO TABS: 400.0000 mg | ORAL_TABLET | Freq: Two times a day (BID) | ORAL | 3 refills | Status: DC

## 2021-10-27 NOTE — Telephone Encounter (Signed)
I was able to place this order and the pt is on her way to the lab to give another urine sample. Proper Urine catch Technique has been explained to pt as well.

## 2021-10-27 NOTE — Telephone Encounter (Signed)
May order urine culture under dysuria-please make sure to give instructions on ideal collection of urine

## 2021-10-27 NOTE — Telephone Encounter (Signed)
Pt states per [employer] with Dr Yong Channel, he suggests she come in for another lab test for UTI if symptoms worsen. She would like to retest asap. Please advise.

## 2021-10-27 NOTE — Addendum Note (Signed)
Addended by: Marijean Heath R on: 10/27/2021 03:31 PM   Modules accepted: Orders

## 2021-10-28 ENCOUNTER — Encounter: Payer: Self-pay | Admitting: Family Medicine

## 2021-10-28 ENCOUNTER — Other Ambulatory Visit: Payer: Self-pay

## 2021-10-28 ENCOUNTER — Ambulatory Visit (INDEPENDENT_AMBULATORY_CARE_PROVIDER_SITE_OTHER): Payer: Medicare HMO

## 2021-10-28 DIAGNOSIS — Z0000 Encounter for general adult medical examination without abnormal findings: Secondary | ICD-10-CM | POA: Diagnosis not present

## 2021-10-28 DIAGNOSIS — C9002 Multiple myeloma in relapse: Secondary | ICD-10-CM

## 2021-10-28 LAB — URINE CULTURE
MICRO NUMBER:: 12905616
Result:: NO GROWTH
SPECIMEN QUALITY:: ADEQUATE

## 2021-10-28 NOTE — Patient Instructions (Signed)
Megan Olson , Thank you for taking time to come for your Medicare Wellness Visit. I appreciate your ongoing commitment to your health goals. Please review the following plan we discussed and let me know if I can assist you in the future.   Screening recommendations/referrals: Colonoscopy: no longer required  Mammogram: Done 02/08/18 Bone Density: Done 04/13/13 repeat every 2 years  Recommended yearly ophthalmology/optometry visit for glaucoma screening and checkup Recommended yearly dental visit for hygiene and checkup  Vaccinations: Influenza vaccine: Done 06/17/21 repeat every year  Pneumococcal vaccine: Up to date Tdap vaccine: Due and discussed   Shingles vaccine: Completed   6/23, 09/08/20 Covid-19:Completed 2/4, 3/1, 06/03/20 & 4/7, 07/04/21  Advanced directives: Please bring a copy of your health care power of attorney and living will to the office at your convenience.  Conditions/risks identified: Stay healthy as can   Next appointment: Follow up in one year for your annual wellness visit    Preventive Care 65 Years and Older, Female Preventive care refers to lifestyle choices and visits with your health care provider that can promote health and wellness. What does preventive care include? A yearly physical exam. This is also called an annual well check. Dental exams once or twice a year. Routine eye exams. Ask your health care provider how often you should have your eyes checked. Personal lifestyle choices, including: Daily care of your teeth and gums. Regular physical activity. Eating a healthy diet. Avoiding tobacco and drug use. Limiting alcohol use. Practicing safe sex. Taking low-dose aspirin every day. Taking vitamin and mineral supplements as recommended by your health care provider. What happens during an annual well check? The services and screenings done by your health care provider during your annual well check will depend on your age, overall health, lifestyle risk  factors, and family history of disease. Counseling  Your health care provider may ask you questions about your: Alcohol use. Tobacco use. Drug use. Emotional well-being. Home and relationship well-being. Sexual activity. Eating habits. History of falls. Memory and ability to understand (cognition). Work and work Statistician. Reproductive health. Screening  You may have the following tests or measurements: Height, weight, and BMI. Blood pressure. Lipid and cholesterol levels. These may be checked every 5 years, or more frequently if you are over 69 years old. Skin check. Lung cancer screening. You may have this screening every year starting at age 54 if you have a 30-pack-year history of smoking and currently smoke or have quit within the past 15 years. Fecal occult blood test (FOBT) of the stool. You may have this test every year starting at age 28. Flexible sigmoidoscopy or colonoscopy. You may have a sigmoidoscopy every 5 years or a colonoscopy every 10 years starting at age 76. Hepatitis C blood test. Hepatitis B blood test. Sexually transmitted disease (STD) testing. Diabetes screening. This is done by checking your blood sugar (glucose) after you have not eaten for a while (fasting). You may have this done every 1-3 years. Bone density scan. This is done to screen for osteoporosis. You may have this done starting at age 26. Mammogram. This may be done every 1-2 years. Talk to your health care provider about how often you should have regular mammograms. Talk with your health care provider about your test results, treatment options, and if necessary, the need for more tests. Vaccines  Your health care provider may recommend certain vaccines, such as: Influenza vaccine. This is recommended every year. Tetanus, diphtheria, and acellular pertussis (Tdap, Td) vaccine. You  may need a Td booster every 10 years. Zoster vaccine. You may need this after age 3. Pneumococcal 13-valent  conjugate (PCV13) vaccine. One dose is recommended after age 61. Pneumococcal polysaccharide (PPSV23) vaccine. One dose is recommended after age 72. Talk to your health care provider about which screenings and vaccines you need and how often you need them. This information is not intended to replace advice given to you by your health care provider. Make sure you discuss any questions you have with your health care provider. Document Released: 10/18/2015 Document Revised: 06/10/2016 Document Reviewed: 07/23/2015 Elsevier Interactive Patient Education  2017 [place] Prevention in the Home Falls can cause injuries. They can happen to people of all ages. There are many things you can do to make your home safe and to help prevent falls. What can I do on the outside of my home? Regularly fix the edges of walkways and driveways and fix any cracks. Remove anything that might make you trip as you walk through a door, such as a raised step or threshold. Trim any bushes or trees on the path to your home. Use bright outdoor lighting. Clear any walking paths of anything that might make someone trip, such as rocks or tools. Regularly check to see if handrails are loose or broken. Make sure that both sides of any steps have handrails. Any raised decks and porches should have guardrails on the edges. Have any leaves, snow, or ice cleared regularly. Use sand or salt on walking paths during winter. Clean up any spills in your garage right away. This includes oil or grease spills. What can I do in the bathroom? Use night lights. Install grab bars by the toilet and in the tub and shower. Do not use towel bars as grab bars. Use non-skid mats or decals in the tub or shower. If you need to sit down in the shower, use a plastic, non-slip stool. Keep the floor dry. Clean up any water that spills on the floor as soon as it happens. Remove soap buildup in the tub or shower regularly. Attach bath mats  securely with double-sided non-slip rug tape. Do not have throw rugs and other things on the floor that can make you trip. What can I do in the bedroom? Use night lights. Make sure that you have a light by your bed that is easy to reach. Do not use any sheets or blankets that are too big for your bed. They should not hang down onto the floor. Have a firm chair that has side arms. You can use this for support while you get dressed. Do not have throw rugs and other things on the floor that can make you trip. What can I do in the kitchen? Clean up any spills right away. Avoid walking on wet floors. Keep items that you use a lot in easy-to-reach places. If you need to reach something above you, use a strong step stool that has a grab bar. Keep electrical cords out of the way. Do not use floor polish or wax that makes floors slippery. If you must use wax, use non-skid floor wax. Do not have throw rugs and other things on the floor that can make you trip. What can I do with my stairs? Do not leave any items on the stairs. Make sure that there are handrails on both sides of the stairs and use them. Fix handrails that are broken or loose. Make sure that handrails are as long as the  stairways. Check any carpeting to make sure that it is firmly attached to the stairs. Fix any carpet that is loose or worn. Avoid having throw rugs at the top or bottom of the stairs. If you do have throw rugs, attach them to the floor with carpet tape. Make sure that you have a light switch at the top of the stairs and the bottom of the stairs. If you do not have them, ask someone to add them for you. What else can I do to help prevent falls? Wear shoes that: Do not have high heels. Have rubber bottoms. Are comfortable and fit you well. Are closed at the toe. Do not wear sandals. If you use a stepladder: Make sure that it is fully opened. Do not climb a closed stepladder. Make sure that both sides of the stepladder  are locked into place. Ask someone to hold it for you, if possible. Clearly mark and make sure that you can see: Any grab bars or handrails. First and last steps. Where the edge of each step is. Use tools that help you move around (mobility aids) if they are needed. These include: Canes. Walkers. Scooters. Crutches. Turn on the lights when you go into a dark area. Replace any light bulbs as soon as they burn out. Set up your furniture so you have a clear path. Avoid moving your furniture around. If any of your floors are uneven, fix them. If there are any pets around you, be aware of where they are. Review your medicines with your doctor. Some medicines can make you feel dizzy. This can increase your chance of falling. Ask your doctor what other things that you can do to help prevent falls. This information is not intended to replace advice given to you by your health care provider. Make sure you discuss any questions you have with your health care provider. Document Released: 07/18/2009 Document Revised: 02/27/2016 Document Reviewed: 10/26/2014 Elsevier Interactive Patient Education  2017 [employer].

## 2021-10-28 NOTE — Progress Notes (Addendum)
Virtual Visit via Telephone Note  I connected with  Megan Olson on 10/28/21 at  8:45 AM EST by telephone and verified that I am speaking with the correct person using two identifiers.  Medicare Annual Wellness visit completed telephonically due to Covid-19 pandemic.   Persons participating in this call: This Health Coach and this patient.   Location: Patient: Home Provider: Office   I discussed the limitations, risks, security and privacy concerns of performing an evaluation and management service by telephone and the availability of in person appointments. The patient expressed understanding and agreed to proceed.  Unable to perform video visit due to video visit attempted and failed and/or patient does not have video capability.   Some vital signs may be absent or patient reported.   Megan Brace, LPN   Subjective:   Megan Olson is a 81 y.o. female who presents for Medicare Annual (Subsequent) preventive examination.  Review of Systems     Cardiac Risk Factors include: advanced age (>64men, >55 women);dyslipidemia;hypertension     Objective:    There were no vitals filed for this visit. There is no height or weight on file to calculate BMI.  Advanced Directives 10/28/2021 10/15/2021 10/01/2021 09/17/2021 07/30/2021 06/04/2021 05/14/2021  Does Patient Have a Medical Advance Directive? Yes Yes Yes Yes Yes Yes Yes  Type of Advance Directive Living will;Healthcare Power of [place];Living will - Megan Olson;Living will [place];Living will [place];Living will [place];Living will  Does patient want to make changes to medical advance directive? - No - Patient declined No - Patient declined No - Patient declined - - -  Copy of Megan Olson in Chart? No - copy requested No - copy requested - No - copy requested No - copy requested No - copy requested No - copy  requested  Would patient like information on creating a medical advance directive? - - - - No - Patient declined - No - Patient declined    Current Medications (verified) Outpatient Encounter Medications as of 10/28/2021  Medication Sig   acyclovir (ZOVIRAX) 400 MG tablet Take 1 tablet (400 mg total) by mouth 2 (two) times daily.   ALPHAGAN P 0.1 % SOLN Place 1 drop into both eyes in the morning, at noon, and at bedtime.    amLODipine (NORVASC) 10 MG tablet Take 1 tablet (10 mg total) by mouth daily.   aspirin EC 81 MG tablet Take 81 mg by mouth daily.   bimatoprost (LUMIGAN) 0.03 % ophthalmic solution Place 1 drop into both eyes at bedtime.    Cholecalciferol (VITAMIN D3) 125 MCG (5000 UT) TABS Take 5,000 Units by mouth daily.   Co-Enzyme Q-10 100 MG CAPS Take 100 mg by mouth daily.   dexamethasone (DECADRON) 4 MG tablet Take 5 tablets ($RemoveBe'20mg'pbdxvcoYB$ ) on day 22 of each cycle. Repeat every 28 days.  Take with breakfast.   dorzolamide-timolol (COSOPT) 22.3-6.8 MG/ML ophthalmic solution Place 1 drop into both eyes 2 (two) times daily.    fentaNYL (DURAGESIC) 12 MCG/HR Place 1 patch onto the skin every 3 (three) days.   levothyroxine (SYNTHROID) 88 MCG tablet Take 1 tablet (88 mcg total) by mouth daily before breakfast.   lidocaine-prilocaine (EMLA) cream Apply 1 application topically as needed. Apply prior to port access.   magnesium chloride (SLOW-MAG) 64 MG TBEC SR tablet Take by mouth.   Multiple Vitamins-Minerals (MULTIVITAMIN WITH MINERALS) tablet Take 1 tablet by  mouth daily. Centrum Silver 50 +   nitrofurantoin, macrocrystal-monohydrate, (MACROBID) 100 MG capsule Take 1 capsule (100 mg total) by mouth 2 (two) times daily.   Omega-3 1000 MG CAPS Take 1,000 mg by mouth daily.    oxyCODONE-acetaminophen (PERCOCET) 5-325 MG tablet Take 1-2 tablets by mouth every 4 (four) hours as needed for moderate pain or severe pain.   polyethylene glycol (MIRALAX) packet Take 17 g by mouth daily.   Simethicone  (GAS-X PO) Take 1 tablet by mouth as needed.   vitamin C (ASCORBIC ACID) 500 MG tablet Take 500 mg by mouth 2 (two) times a week.    ondansetron (ZOFRAN) 8 MG tablet Take 8 mg by mouth 30 to 60 min prior to Cytoxan administration then take 8 mg twice daily as needed for nausea and vomiting.   PFIZER COVID-19 VAC BIVALENT injection    prochlorperazine (COMPAZINE) 10 MG tablet Take 1 tablet (10 mg total) by mouth every 6 (six) hours as needed (Nausea or vomiting). (Patient not taking: Reported on 10/28/2021)   [DISCONTINUED] hydrOXYzine (ATARAX/VISTARIL) 25 MG tablet Take 1 tablet (25 mg total) by mouth 3 (three) times daily as needed.   [DISCONTINUED] senna-docusate (SENOKOT-S) 8.6-50 MG tablet Take 2 tablets by mouth at bedtime.   No facility-administered encounter medications on file as of 10/28/2021.    Allergies (verified) Ace inhibitors, Benadryl [diphenhydramine], Diamox [acetazolamide], Sulfamethoxazole, Lenalidomide, and Penicillins   History: Past Medical History:  Diagnosis Date   Anemia    Glaucoma    HYPERTENSION 03/11/2007   Multiple myeloma ([place])    OSTEOPENIA 03/11/2007   Pre-diabetes    Thyroid cancer ([place])    Thyroid disease    Tubular adenoma of colon 07/2015   Past Surgical History:  Procedure Laterality Date   BONE MARROW BIOPSY     multiple   BREAST EXCISIONAL BIOPSY Right 2000   BREAST LUMPECTOMY  1990   benign   CATARACT EXTRACTION Bilateral 2018   DILATION AND CURETTAGE OF UTERUS     bleeding at menopause. No uterine cancer   IR IMAGING GUIDED PORT INSERTION  05/16/2021   IR RADIOLOGIST EVAL & MGMT  12/13/2018   THYROIDECTOMY N/A 08/02/2020   Procedure: TOTAL THYROIDECTOMY;  Surgeon: [name], MD;  Location: [facility];  Service: General;  Laterality: N/A;   TONSILLECTOMY     age 30   Family History  Problem Relation Age of Onset   Heart disease Mother        CHF mother died 16   Arthritis Mother    Glaucoma Mother        sister as well   Alcohol  abuse Father    Suicidality Father    Heart disease Sister        aortic valve replacement   Lung cancer Sister        smoker   Hyperlipidemia Brother    Hypertension Brother    COPD Brother    Colon polyps Brother    Arthritis Sister    Hypertension Sister    Glaucoma Sister    Hashimoto's thyroiditis Sister    Colon polyps Sister    Hypertension Son    Stroke Maternal Grandmother    Cystic fibrosis Niece    Colon cancer Neg Hx    Social History   Socioeconomic History   Marital status: Married    Spouse name: Not on file   Number of children: 1   Years of education: Not on file   Highest education  level: Not on file  Occupational History   Occupation: Retired  Tobacco Use   Smoking status: Former    Packs/day: 0.50    Years: 7.00    Pack years: 3.50    Types: Cigarettes    Quit date: 01/04/1961    Years since quitting: 60.8   Smokeless tobacco: Never  Vaping Use   Vaping Use: Never used  Substance and Sexual Activity   Alcohol use: Not Currently    Alcohol/week: 1.0 standard drink    Types: 1 Standard drinks or equivalent per week    Comment: occas    Drug use: No   Sexual activity: Not Currently  Other Topics Concern   Not on file  Social History Narrative   Married. Lives with husband (patient of Dr. Yong Channel). 1 son. No grandkids. 1 granddog.       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, swing dancing, dinner, read      Social Determinants of Radio broadcast assistant Strain: Low Risk    Difficulty of Paying Living Expenses: Not hard at all  Food Insecurity: No Food Insecurity   Worried About Charity fundraiser in the Last Year: Never true   Arboriculturist in the Last Year: Never true  Transportation Needs: No Transportation Needs   Lack of Transportation (Medical): No   Lack of Transportation (Non-Medical): No  Physical Activity: Inactive   Days of Exercise  per Week: 0 days   Minutes of Exercise per Session: 0 min  Stress: No Stress Concern Present   Feeling of Stress : Only a little  Social Connections: Moderately Integrated   Frequency of Communication with Friends and Family: More than three times a week   Frequency of Social Gatherings with Friends and Family: More than three times a week   Attends Religious Services: More than 4 times per year   Active Member of [employer] or Organizations: No   Attends Music therapist: Never   Marital Status: Married    Tobacco Counseling Counseling given: Not Answered   Clinical Intake:  Pre-visit preparation completed: Yes  Pain : No/denies pain     BMI - recorded: 20.74 Nutritional Status: BMI of 19-24  Normal Nutritional Risks: None Diabetes: No  How often do you need to have someone help you when you read instructions, pamphlets, or other written materials from your doctor or pharmacy?: 1 - Never  Diabetic?no  Interpreter Needed?: No  Information entered by :: Charlott Rakes, LPN   Activities of Daily Living In your present state of health, do you have any difficulty performing the following activities: 10/28/2021 05/06/2021  Hearing? N N  Vision? N N  Difficulty concentrating or making decisions? N N  Walking or climbing stairs? N N  Dressing or bathing? N N  Doing errands, shopping? N -  Preparing Food and eating ? N -  Using the Toilet? N -  In the past six months, have you accidently leaked urine? N -  Do you have problems with loss of bowel control? Y -  Comment fecal incontinence at times -  Managing your Medications? N -  Managing your Finances? N -  Housekeeping or managing your Housekeeping? N -  Some recent data might be hidden    Patient Care Team: [name], MD as PCP - General (Family Medicine) [name], MD as Consulting Physician (  Hematology) Bond, [name], MD as Referring Physician (Ophthalmology) [name],  [name], MD as Consulting Physician (Hematology and Oncology) [name], MD as Consulting Physician (Internal Medicine) [name], MD as Consulting Physician (General Surgery)  Indicate any recent Medical Services you may have received from other than [facility] providers in the past year (date may be approximate).     Assessment:   This is a routine wellness examination for Laquisha.  Hearing/Vision screen Hearing Screening - Comments:: Pt denies any hearing issues Vision Screening - Comments:: Pt follows Dr Luther Bradley For eye exams   Dietary issues and exercise activities discussed: Current Exercise Habits: The patient does not participate in regular exercise at present   Goals Addressed             This Visit's Progress    Patient Stated       Stay as healthy as can be       Depression Screen PHQ 2/9 Scores 10/28/2021 01/27/2021 09/18/2019 09/06/2018 04/04/2018 04/02/2017 04/01/2016  PHQ - 2 Score 0 0 0 0 0 0 0  PHQ- 9 Score - - - 0 - - -    Fall Risk Fall Risk  10/28/2021 08/04/2021 01/27/2021 09/18/2019 09/06/2018  Falls in the past year? 0 0 0 0 0  Number falls in past yr: 0 0 0 - -  Injury with Fall? 0 0 0 0 -  Risk for fall due to : Impaired vision - - Medication side effect -  Follow up Falls prevention discussed Falls evaluation completed - Falls evaluation completed;Education provided;Falls prevention discussed -    FALL RISK PREVENTION PERTAINING TO THE HOME:  Any stairs in or around the home? No  If so, are there any without handrails? No  Home free of loose throw rugs in walkways, pet beds, electrical cords, etc? Yes  Adequate lighting in your home to reduce risk of falls? Yes   ASSISTIVE DEVICES UTILIZED TO PREVENT FALLS:  Life alert? No  Use of a cane, walker or w/c? No  Grab bars in the bathroom? No  Shower chair or bench in shower? Yes  Elevated toilet seat or a handicapped toilet? No   TIMED UP AND GO:  Was the test performed? No .   Cognitive  Function: MMSE - Mini Mental State Exam 09/06/2018  Orientation to time 5  Orientation to Place 5  Registration 3  Attention/ Calculation 5  Recall 3  Language- name 2 objects 2  Language- repeat 1  Language- follow 3 step command 3  Language- read & follow direction 1  Write a sentence 1  Copy design 1  Total score 30     6CIT Screen 10/28/2021  What Year? 0 points  What month? 0 points  What time? 0 points  Count back from 20 0 points  Months in reverse 0 points  Repeat phrase 0 points  Total Score 0    Immunizations Immunization History  Administered Date(s) Administered   Fluad Quad(high Dose 65+) 06/13/2019, 07/03/2020, 06/17/2021   Influenza Split 07/19/2012   Influenza Whole 07/17/2008, 06/28/2009, 06/25/2010   Influenza, High Dose Seasonal PF 07/22/2016, 06/24/2017, 06/23/2018   Influenza,inj,Quad PF,6+ Mos 06/27/2013, 06/18/2014   Influenza-Unspecified 07/03/2015   PFIZER(Purple Top)SARS-COV-2 Vaccination 11/09/2019, 12/04/2019, 06/03/2020, 01/09/2021, 07/04/2021   PNEUMOCOCCAL CONJUGATE-20 05/21/2021   Pneumococcal Conjugate-13 03/29/2015   Pneumococcal Polysaccharide-23 04/04/2006   Td 02/10/2010   Zoster Recombinat (Shingrix) 03/27/2020, 09/06/2020   Zoster, Live 09/26/2008     TDAP status: Due, Education has  been provided regarding the importance of this vaccine. Advised may receive this vaccine at local pharmacy or Health Dept. Aware to provide a copy of the vaccination record if obtained from local pharmacy or Health Dept. Verbalized acceptance and understanding.  Flu Vaccine status: Up to date  Pneumococcal vaccine status: Up to date  Covid-19 vaccine status: Completed vaccines  Qualifies for Shingles Vaccine? Yes   Zostavax completed Yes   Shingrix Completed?: Yes  Screening Tests Health Maintenance  Topic Date Due   TETANUS/TDAP  02/11/2020   COVID-19 Vaccine (6 - Booster for Pfizer series) 08/29/2021   Pneumonia Vaccine 38+ Years old   Completed   INFLUENZA VACCINE  Completed   DEXA SCAN  Completed   Zoster Vaccines- Shingrix  Completed   HPV VACCINES  Aged Out   COLONOSCOPY (Pts 45-83yrs Insurance coverage will need to be confirmed)  [place] Maintenance Due  Topic Date Due   TETANUS/TDAP  02/11/2020   COVID-19 Vaccine (6 - Booster for [place] series) 08/29/2021    Colorectal cancer screening: No longer required.   Mammogram status: Completed 02/08/18. Repeat every year  Bone Density status: Completed 04/13/13. Results reflect: Bone density results: OSTEOPOROSIS. Repeat every 2 years.  Additional Screening:   Vision Screening: Recommended annual ophthalmology exams for early detection of glaucoma and other disorders of the eye. Is the patient up to date with their annual eye exam?  Yes  Who is the provider or what is the name of the office in which the patient attends annual eye exams? Dr Luther Bradley If pt is not established with a provider, would they like to be referred to a provider to establish care? Yes .   Dental Screening: Recommended annual dental exams for proper oral hygiene  Community Resource Referral / Chronic Care Management: CRR required this visit?  No   CCM required this visit?  No      Plan:     I have personally reviewed and noted the following in the patients chart:   Medical and social history Use of alcohol, tobacco or illicit drugs  Current medications and supplements including opioid prescriptions.  Functional ability and status Nutritional status Physical activity Advanced directives List of other physicians Hospitalizations, surgeries, and ER visits in previous 12 months Vitals Screenings to include cognitive, depression, and falls Referrals and appointments  In addition, I have reviewed and discussed with patient certain preventive protocols, quality metrics, and best practice recommendations. A written personalized care plan for  preventive services as well as general preventive health recommendations were provided to patient.     Megan Brace, LPN   7/84/1282   Nurse Notes: Pt stated she would also send a my chart message making you aware that she has been using AZO prior to giving  Urine sample.  Pt stated she still has urgency , burning and noticed a small amount of blood when wiping. Please advise

## 2021-10-29 ENCOUNTER — Inpatient Hospital Stay: Payer: Medicare HMO

## 2021-10-29 ENCOUNTER — Other Ambulatory Visit: Payer: Self-pay

## 2021-10-29 ENCOUNTER — Other Ambulatory Visit: Payer: Self-pay | Admitting: *Deleted

## 2021-10-29 VITALS — BP 171/85 | HR 67 | Temp 98.7°F | Resp 18 | Ht 60.0 in | Wt 102.8 lb

## 2021-10-29 DIAGNOSIS — Z95828 Presence of other vascular implants and grafts: Secondary | ICD-10-CM

## 2021-10-29 DIAGNOSIS — C9002 Multiple myeloma in relapse: Secondary | ICD-10-CM | POA: Diagnosis not present

## 2021-10-29 DIAGNOSIS — E079 Disorder of thyroid, unspecified: Secondary | ICD-10-CM | POA: Diagnosis not present

## 2021-10-29 DIAGNOSIS — T451X5A Adverse effect of antineoplastic and immunosuppressive drugs, initial encounter: Secondary | ICD-10-CM | POA: Diagnosis not present

## 2021-10-29 DIAGNOSIS — Z79899 Other long term (current) drug therapy: Secondary | ICD-10-CM | POA: Diagnosis not present

## 2021-10-29 DIAGNOSIS — G35 Multiple sclerosis: Secondary | ICD-10-CM | POA: Diagnosis not present

## 2021-10-29 DIAGNOSIS — I10 Essential (primary) hypertension: Secondary | ICD-10-CM | POA: Diagnosis not present

## 2021-10-29 DIAGNOSIS — D6481 Anemia due to antineoplastic chemotherapy: Secondary | ICD-10-CM | POA: Diagnosis not present

## 2021-10-29 DIAGNOSIS — M8580 Other specified disorders of bone density and structure, unspecified site: Secondary | ICD-10-CM | POA: Diagnosis not present

## 2021-10-29 DIAGNOSIS — R300 Dysuria: Secondary | ICD-10-CM

## 2021-10-29 DIAGNOSIS — Z7189 Other specified counseling: Secondary | ICD-10-CM

## 2021-10-29 DIAGNOSIS — Z7952 Long term (current) use of systemic steroids: Secondary | ICD-10-CM | POA: Diagnosis not present

## 2021-10-29 DIAGNOSIS — Z5112 Encounter for antineoplastic immunotherapy: Secondary | ICD-10-CM | POA: Diagnosis not present

## 2021-10-29 DIAGNOSIS — Z7982 Long term (current) use of aspirin: Secondary | ICD-10-CM | POA: Diagnosis not present

## 2021-10-29 DIAGNOSIS — Z85850 Personal history of malignant neoplasm of thyroid: Secondary | ICD-10-CM | POA: Diagnosis not present

## 2021-10-29 LAB — CBC WITH DIFFERENTIAL (CANCER CENTER ONLY)
Abs Immature Granulocytes: 0.05 10*3/uL (ref 0.00–0.07)
Basophils Absolute: 0 10*3/uL (ref 0.0–0.1)
Basophils Relative: 1 %
Eosinophils Absolute: 0.1 10*3/uL (ref 0.0–0.5)
Eosinophils Relative: 3 %
HCT: 33.6 % — ABNORMAL LOW (ref 36.0–46.0)
Hemoglobin: 11.3 g/dL — ABNORMAL LOW (ref 12.0–15.0)
Immature Granulocytes: 1 %
Lymphocytes Relative: 7 %
Lymphs Abs: 0.3 10*3/uL — ABNORMAL LOW (ref 0.7–4.0)
MCH: 34.7 pg — ABNORMAL HIGH (ref 26.0–34.0)
MCHC: 33.6 g/dL (ref 30.0–36.0)
MCV: 103.1 fL — ABNORMAL HIGH (ref 80.0–100.0)
Monocytes Absolute: 0.7 10*3/uL (ref 0.1–1.0)
Monocytes Relative: 15 %
Neutro Abs: 3.5 10*3/uL (ref 1.7–7.7)
Neutrophils Relative %: 73 %
Platelet Count: 253 10*3/uL (ref 150–400)
RBC: 3.26 MIL/uL — ABNORMAL LOW (ref 3.87–5.11)
RDW: 12.5 % (ref 11.5–15.5)
WBC Count: 4.7 10*3/uL (ref 4.0–10.5)
nRBC: 0 % (ref 0.0–0.2)

## 2021-10-29 LAB — CMP (CANCER CENTER ONLY)
ALT: 10 U/L (ref 0–44)
AST: 15 U/L (ref 15–41)
Albumin: 4 g/dL (ref 3.5–5.0)
Alkaline Phosphatase: 30 U/L — ABNORMAL LOW (ref 38–126)
Anion gap: 5 (ref 5–15)
BUN: 19 mg/dL (ref 8–23)
CO2: 25 mmol/L (ref 22–32)
Calcium: 8.9 mg/dL (ref 8.9–10.3)
Chloride: 106 mmol/L (ref 98–111)
Creatinine: 0.62 mg/dL (ref 0.44–1.00)
GFR, Estimated: >60 mL/min (ref >60)
Glucose, Bld: 99 mg/dL (ref 70–99)
Potassium: 4.4 mmol/L (ref 3.5–5.1)
Sodium: 136 mmol/L (ref 135–145)
Total Bilirubin: 0.4 mg/dL (ref 0.3–1.2)
Total Protein: 6.4 g/dL — ABNORMAL LOW (ref 6.5–8.1)

## 2021-10-29 MED ORDER — SODIUM CHLORIDE 0.9% FLUSH
10.0000 mL | INTRAVENOUS | Status: DC | PRN
  Administered 2021-10-29: 11:00:00 10 mL

## 2021-10-29 MED ORDER — ACETAMINOPHEN 500 MG PO TABS
1000.0000 mg | ORAL_TABLET | Freq: Once | ORAL | Status: AC
  Administered 2021-10-29: 09:00:00 1000 mg via ORAL
  Filled 2021-10-29: qty 2

## 2021-10-29 MED ORDER — DEXTROSE 5 % IV SOLN
56.0000 mg/m2 | Freq: Once | INTRAVENOUS | Status: AC
  Administered 2021-10-29: 10:00:00 80 mg via INTRAVENOUS
  Filled 2021-10-29: qty 30

## 2021-10-29 MED ORDER — SODIUM CHLORIDE 0.9% FLUSH
10.0000 mL | INTRAVENOUS | Status: AC | PRN
  Administered 2021-10-29: 08:00:00 10 mL

## 2021-10-29 MED ORDER — ONDANSETRON HCL 4 MG/2ML IJ SOLN
4.0000 mg | Freq: Once | INTRAMUSCULAR | Status: AC
  Administered 2021-10-29: 09:00:00 4 mg via INTRAVENOUS
  Filled 2021-10-29: qty 2

## 2021-10-29 MED ORDER — HEPARIN SOD (PORK) LOCK FLUSH 100 UNIT/ML IV SOLN
500.0000 [IU] | Freq: Once | INTRAVENOUS | Status: AC | PRN
  Administered 2021-10-29: 11:00:00 500 [IU]

## 2021-10-29 MED ORDER — SODIUM CHLORIDE 0.9 % IV SOLN
10.0000 mg | Freq: Once | INTRAVENOUS | Status: AC
  Administered 2021-10-29: 09:00:00 10 mg via INTRAVENOUS
  Filled 2021-10-29: qty 10

## 2021-10-29 MED ORDER — SODIUM CHLORIDE 0.9 % IV SOLN: Freq: Once | INTRAVENOUS | Status: AC

## 2021-10-30 ENCOUNTER — Telehealth: Payer: Self-pay

## 2021-10-30 DIAGNOSIS — R310 Gross hematuria: Secondary | ICD-10-CM

## 2021-10-30 NOTE — Telephone Encounter (Signed)
Patient called requesting status on referral.  Every time she urinates she has some blood sometimes with small clots  Does this referral need to be stat/urgent?  Advised to go to ER tonight if worsens  We will change status of referral

## 2021-10-30 NOTE — Telephone Encounter (Signed)
Spoke to patient and she is asking if referral has been placed to an office- I informed her from what I see It has not been directed anywhere.  She states a Suanne Marker called her earlier today and told her she would have Jeanett Schlein here look into this referral, patient is now waiting on call from referrals or Jeanett Schlein or a urology referral

## 2021-10-30 NOTE — Telephone Encounter (Signed)
Patient has called in inquiring on Urology referral.   Patient states her bleeding has not gotten heavy.   I have sent patient to Access Nurse for triage.

## 2021-10-30 NOTE — Telephone Encounter (Signed)
Called Megan Olson and advised her that our referral coordinator would be calling her first thing in the morning to schedule her CAT Scan.  I also advised that we changed the Urology referral to a STAT referral as well. I reiterated the importance of going to the ED if symptoms worsened. She understood and said hope it would not get to that as she rather not go to the ED.

## 2021-10-30 NOTE — Telephone Encounter (Signed)
New symptom of gross hematuria-we will order stat urology referral as well as CT renal stone study. Jeanett Schlein is calling to inform patient of plan and let Lattie Haw to prioritize tomorrow morning

## 2021-10-30 NOTE — Telephone Encounter (Signed)
Patient Name: Megan Olson PAU L Gender: Female DOB: 1941-02-28 Age: 81 Y 14 M 10 D Return Phone Number: [phone] (Primary), [phone] (Secondary) Address: City/ State/ Zip: [place]  [address] [address] [place] [facility] [place] [facility] [place] [facility] Provider Garret Reddish- MD Contact Type Call Who Is Calling Patient / Member / Family / Caregiver Call Type Triage / Clinical Relationship To Patient Self Return Phone Number [phone] (Primary) Chief Complaint Urine, Blood In Reason for Call Symptomatic / Request for [place] caller states has blood in urine was referred to urologist office request triage. sees Dr Yong Channel Translation No Nurse Assessment Nurse: Cira Servant, RN, Suanne Marker Date/Time Eilene Ghazi Time): 10/30/2021 10:51:54 AM Confirm and document reason for call. If symptomatic, describe symptoms. ---caller states has blood in urine was referred to urologist office request triage. sees Dr Yong Channel. She is seeing blood and blood clots in the toilet this morning; more bleeding than she has experienced over the past week. She is finishing Nitrofurantoin tomorrow. She is also taking Azo. Does the patient have any new or worsening symptoms? ---Yes Will a triage be completed? ---Yes Related visit to physician within the last 2 weeks? ---Yes Does the PT have any chronic conditions? (i.e. diabetes, asthma, this includes High risk factors for pregnancy, etc.) ---Yes List chronic conditions. ---multiple myeloma cancer (taking chemo); glaucoma; thyroid removed; using chronic lower back pain meds (fentanyl patch and oxycodone) Is this a behavioral health or substance abuse call? ---No Guidelines Guideline Title Affirmed Question Affirmed Notes Nurse Date/Time (Eastern Time) Urine - Blood In Pain or burning with passing urine Cira Servant, RN, Rhonda 10/30/2021 10:53:07  Disp. Time Eilene Ghazi Time)  Disposition Final User 10/30/2021 11:12:55 AM See PCP within 24 Hours Yes Cira Servant, RN, Rosalyn Charters Disagree/Comply Comply Caller Understands Yes PreDisposition Did not know what to do Care Advice Given Per Guideline SEE PCP WITHIN 24 HOURS: * IF OFFICE WILL BE OPEN: You need to be examined within the next 24 hours. Call your doctor (or NP/PA) when the office opens and make an appointment. DRINK EXTRA FLUIDS: * Drink extra fluids. * Drink 8 to 10 cups (1,800 to 2,400 ml) of liquids a day. * Reason: This will water-down your urine and make it less painful to pass. If there is an infection, this will help wash out the germs from your bladder. CALL BACK IF: * Fever occurs * You become worse CARE ADVICE given per Urine, Blood In (Adult) guideline. Comments User: Verlin Fester, RN Date/Time Eilene Ghazi Time): 10/30/2021 11:12:49 AM Called the office and spoke with Jeanett Schlein who advised that the urology referral has been made and the office is looking into the details of appointment. Advised of the 24 hour outcome and that patients s/s are worsening since her visit with Dr. Yong Channel and he may want to see her again if the appt with urology is going to be over 24 hrs as per triage outcome. Advised caller that office will be in touch per [facility]. Referrals REFERRED TO PCP OFFICE

## 2021-10-31 ENCOUNTER — Ambulatory Visit (HOSPITAL_COMMUNITY)
Admission: RE | Admit: 2021-10-31 | Discharge: 2021-10-31 | Disposition: A | Discharge status: Home / Self Care | Payer: Medicare HMO | Source: Ambulatory Visit | Attending: Family Medicine | Admitting: Family Medicine

## 2021-10-31 ENCOUNTER — Other Ambulatory Visit: Payer: Self-pay

## 2021-10-31 DIAGNOSIS — Z87442 Personal history of urinary calculi: Secondary | ICD-10-CM | POA: Diagnosis not present

## 2021-10-31 DIAGNOSIS — R310 Gross hematuria: Secondary | ICD-10-CM | POA: Diagnosis not present

## 2021-10-31 DIAGNOSIS — N3289 Other specified disorders of bladder: Secondary | ICD-10-CM | POA: Diagnosis not present

## 2021-10-31 DIAGNOSIS — M4856XA Collapsed vertebra, not elsewhere classified, lumbar region, initial encounter for fracture: Secondary | ICD-10-CM | POA: Diagnosis not present

## 2021-10-31 NOTE — Telephone Encounter (Signed)
Discussed results by phone - see result note

## 2021-10-31 NOTE — Telephone Encounter (Signed)
[facility] chart and CT results are now in

## 2021-11-05 ENCOUNTER — Telehealth: Payer: Self-pay | Admitting: Family Medicine

## 2021-11-05 DIAGNOSIS — R300 Dysuria: Secondary | ICD-10-CM | POA: Diagnosis not present

## 2021-11-05 DIAGNOSIS — R310 Gross hematuria: Secondary | ICD-10-CM | POA: Diagnosis not present

## 2021-11-05 NOTE — Chronic Care Management (AMB) (Signed)
°  Chronic Care Management   Note  11/05/2021 Name: [name] MRN: [id number] DOB: [birth year]  Megan Olson is a 81 y.o. year old female who is a primary care patient of [name], MD. I reached out to [name] by phone today in response to a referral sent by [name]'s PCP, [name], MD.   [name] was given information about Chronic Care Management services today including:  CCM service includes personalized support from designated clinical staff supervised by her physician, including individualized plan of care and coordination with other care providers 24/7 contact phone numbers for assistance for urgent and routine care needs. Service will only be billed when office clinical staff spend 20 minutes or more in a month to coordinate care. Only one practitioner may furnish and bill the service in a calendar month. The patient may stop CCM services at any time (effective at the end of the month) by phone call to the office staff.   Patient agreed to services and verbal consent obtained.   Follow up plan:   [place]atjana Secretary/administrator

## 2021-11-11 ENCOUNTER — Other Ambulatory Visit: Payer: Self-pay

## 2021-11-11 DIAGNOSIS — C9002 Multiple myeloma in relapse: Secondary | ICD-10-CM

## 2021-11-11 NOTE — Progress Notes (Cosign Needed)
[place]   [phone] [phone]   Clinic Follow up Note   Patient Care Team: [name], MD as PCP - General (Family Medicine) [name], MD as Consulting Physician (Hematology) Bond, [name], MD as Referring Physician (Ophthalmology) [name], [name], MD as Consulting Physician (Hematology and Oncology) [name], MD as Consulting Physician (Internal Medicine) [name], MD as Consulting Physician (General Surgery) [name], [facility] as Pharmacist (Pharmacist)  DOS .11/12/2021  CHIEF COMPLAINT:  Follow-up for continued evaluation and management of relapsed multiple myeloma and next cycle of carfilzomib dexamethasone    SUMMARY OF ONCOLOGIC HISTORY: Oncology History  Multiple myeloma not having achieved remission ([place])  12/12/2018 Initial Diagnosis   Multiple myeloma not having achieved remission ([place])   12/20/2018 - 10/04/2019 Chemotherapy   The patient had dexamethasone (DECADRON) tablet 20 mg, 20 mg (100 % of original dose 20 mg), Oral, Once, 14 of 14 cycles Dose modification: 20 mg (original dose 20 mg, Cycle 1), 10 mg (original dose 20 mg, Cycle 14) Administration: 20 mg (12/20/2018), 20 mg (12/27/2018), 20 mg (01/10/2019), 20 mg (01/17/2019), 20 mg (01/31/2019), 20 mg (02/07/2019), 20 mg (03/14/2019), 20 mg (03/21/2019), 20 mg (02/21/2019), 20 mg (02/28/2019), 20 mg (04/04/2019), 20 mg (04/11/2019), 20 mg (04/25/2019), 20 mg (05/02/2019), 20 mg (05/16/2019), 20 mg (05/23/2019), 20 mg (06/06/2019), 20 mg (06/13/2019), 20 mg (06/27/2019), 20 mg (07/04/2019), 20 mg (07/18/2019), 20 mg (07/25/2019), 20 mg (08/08/2019), 20 mg (08/15/2019), 20 mg (08/29/2019), 20 mg (09/05/2019), 10 mg (09/19/2019) lenalidomide (REVLIMID) 15 MG capsule, 1 of 1 cycle, Start date: 01/18/2019, End date: 02/21/2019 bortezomib SQ (VELCADE) chemo injection 2 mg, 1.3 mg/m2 = 2 mg, Subcutaneous,  Once, 14 of 14 cycles Administration: 2 mg (12/20/2018), 2  mg (12/27/2018), 2 mg (12/23/2018), 2 mg (12/30/2018), 2 mg (01/10/2019), 2 mg (01/17/2019), 2 mg (01/13/2019), 2 mg (01/20/2019), 2 mg (01/31/2019), 2 mg (02/07/2019), 2 mg (02/03/2019), 2 mg (02/10/2019), 2 mg (03/14/2019), 2 mg (03/17/2019), 2 mg (03/21/2019), 2 mg (03/24/2019), 2 mg (02/21/2019), 2 mg (02/24/2019), 2 mg (02/28/2019), 2 mg (03/03/2019), 2 mg (04/04/2019), 2 mg (04/06/2019), 2 mg (04/11/2019), 2 mg (04/14/2019), 2 mg (04/25/2019), 2 mg (04/28/2019), 2 mg (05/02/2019), 2 mg (05/05/2019), 2 mg (05/16/2019), 2 mg (05/19/2019), 2 mg (05/23/2019), 2 mg (05/26/2019), 2 mg (06/06/2019), 2 mg (06/09/2019), 2 mg (06/13/2019), 2 mg (06/16/2019), 2 mg (06/27/2019), 2 mg (06/30/2019), 2 mg (07/04/2019), 2 mg (07/07/2019), 2 mg (07/18/2019), 2 mg (07/21/2019), 2 mg (07/25/2019), 2 mg (07/28/2019), 2 mg (08/08/2019), 2 mg (08/11/2019), 2 mg (08/15/2019), 2 mg (08/18/2019), 2 mg (08/29/2019), 2 mg (09/01/2019), 2 mg (09/05/2019), 2 mg (09/08/2019), 2 mg (09/19/2019), 2 mg (10/04/2019)   for chemotherapy treatment.     Multiple myeloma in relapse ([place])  04/16/2021 Initial Diagnosis   Multiple myeloma in relapse ([place])   04/30/2021 -  Chemotherapy   Patient is on Treatment Plan : MYELOMA RELAPSED/REFRACTORY KCd q28d       CURRENT THERAPY: Kyprolis and dexa started on 04/30/2021  INTERVAL HISTORY:  Ms Dejonge is here for follow-up for continued evaluation management of her multiple myeloma and next dose of carfilzomib.   She notes no acute symptoms since her last clinic visit.  No new focal bone pains.  Stable energy levels. No new notable toxicities from the carfilzomib. No infection issues. Saw her urologist Dr. Diona Fanti recently for thickening in her bladder.  Urine culture was negative.  She reports urology did not feel she had a  urinary infection that was just under distended bladder. We discussed and made a shared decision to continue current approach with carfilzomib dexamethasone with a plan to gradually wean down dexamethasone given her  glaucoma concerns which predated steroid use. Labs done today reviewed  ROS .10 Point review of Systems was done is negative except as noted above.  MEDICAL HISTORY:  Past Medical History:  Diagnosis Date   Anemia    Glaucoma    HYPERTENSION 03/11/2007   Multiple myeloma ([place])    OSTEOPENIA 03/11/2007   Pre-diabetes    Thyroid cancer ([place])    Thyroid disease    Tubular adenoma of colon 07/2015    SURGICAL HISTORY: Past Surgical History:  Procedure Laterality Date   BONE MARROW BIOPSY     multiple   BREAST EXCISIONAL BIOPSY Right 2000   BREAST LUMPECTOMY  1990   benign   CATARACT EXTRACTION Bilateral 2018   DILATION AND CURETTAGE OF UTERUS     bleeding at menopause. No uterine cancer   IR IMAGING GUIDED PORT INSERTION  05/16/2021   IR RADIOLOGIST EVAL & MGMT  12/13/2018   THYROIDECTOMY N/A 08/02/2020   Procedure: TOTAL THYROIDECTOMY;  Surgeon: [name], MD;  Location: [facility];  Service: General;  Laterality: N/A;   TONSILLECTOMY     age 45   I have reviewed the social history and family history with the patient and they are unchanged from previous note.  ALLERGIES:  is allergic to ace inhibitors, benadryl [diphenhydramine], diamox [acetazolamide], sulfamethoxazole, lenalidomide, and penicillins.  MEDICATIONS:  Current Outpatient Medications  Medication Sig Dispense Refill   acyclovir (ZOVIRAX) 400 MG tablet Take 1 tablet (400 mg total) by mouth 2 (two) times daily. 60 tablet 3   ALPHAGAN P 0.1 % SOLN Place 1 drop into both eyes in the morning, at noon, and at bedtime.      amLODipine (NORVASC) 10 MG tablet Take 1 tablet (10 mg total) by mouth daily. 90 tablet 3   aspirin EC 81 MG tablet Take 81 mg by mouth daily.     bimatoprost (LUMIGAN) 0.03 % ophthalmic solution Place 1 drop into both eyes at bedtime.      Cholecalciferol (VITAMIN D3) 125 MCG (5000 UT) TABS Take 5,000 Units by mouth daily.     Co-Enzyme Q-10 100 MG CAPS Take 100 mg by mouth daily.     dexamethasone  (DECADRON) 4 MG tablet Take 5 tablets ($RemoveBe'20mg'zYJajMNYe$ ) on day 22 of each cycle. Repeat every 28 days.  Take with breakfast. 12 tablet 3   dorzolamide-timolol (COSOPT) 22.3-6.8 MG/ML ophthalmic solution Place 1 drop into both eyes 2 (two) times daily.   11   fentaNYL (DURAGESIC) 12 MCG/HR Place 1 patch onto the skin every 3 (three) days. 10 patch 0   levothyroxine (SYNTHROID) 88 MCG tablet Take 1 tablet (88 mcg total) by mouth daily before breakfast. 45 tablet 5   lidocaine-prilocaine (EMLA) cream Apply 1 application topically as needed. Apply prior to port access. 30 g 0   magnesium chloride (SLOW-MAG) 64 MG TBEC SR tablet Take by mouth.     Multiple Vitamins-Minerals (MULTIVITAMIN WITH MINERALS) tablet Take 1 tablet by mouth daily. Centrum Silver 50 +     nitrofurantoin, macrocrystal-monohydrate, (MACROBID) 100 MG capsule Take 1 capsule (100 mg total) by mouth 2 (two) times daily. 14 capsule 0   Omega-3 1000 MG CAPS Take 1,000 mg by mouth daily.      ondansetron (ZOFRAN) 8 MG tablet Take 8 mg by mouth 30  to 60 min prior to Cytoxan administration then take 8 mg twice daily as needed for nausea and vomiting. 30 tablet 1   oxyCODONE-acetaminophen (PERCOCET) 5-325 MG tablet Take 1-2 tablets by mouth every 4 (four) hours as needed for moderate pain or severe pain. 90 tablet 0   PFIZER COVID-19 VAC BIVALENT injection      polyethylene glycol (MIRALAX) packet Take 17 g by mouth daily. 30 each 1   prochlorperazine (COMPAZINE) 10 MG tablet Take 1 tablet (10 mg total) by mouth every 6 (six) hours as needed (Nausea or vomiting). (Patient not taking: Reported on 10/28/2021) 30 tablet 1   Simethicone (GAS-X PO) Take 1 tablet by mouth as needed.     vitamin C (ASCORBIC ACID) 500 MG tablet Take 500 mg by mouth 2 (two) times a week.      No current facility-administered medications for this visit.    PHYSICAL EXAMINATION:. .There were no vitals taken for this visit.  No acute distress GENERAL:alert, in no acute  distress and comfortable SKIN: no acute rashes, no significant lesions EYES: conjunctiva are pink and non-injected, sclera anicteric OROPHARYNX: MMM, no exudates, no oropharyngeal erythema or ulceration NECK: supple, no JVD LYMPH:  no palpable lymphadenopathy in the cervical, axillary or inguinal regions LUNGS: clear to auscultation b/l with normal respiratory effort HEART: regular rate & rhythm ABDOMEN:  normoactive bowel sounds , non tender, not distended. Extremity: no pedal edema PSYCH: alert & oriented x 3 with fluent speech NEURO: no focal motor/sensory deficits     LABORATORY DATA:  I have reviewed the data as listed CBC Latest Ref Rng & Units 10/29/2021 10/22/2021 10/15/2021  WBC 4.0 - 10.5 K/uL 4.7 3.0(L) 4.4  Hemoglobin 12.0 - 15.0 g/dL 11.3(L) 11.3(L) 11.5(L)  Hematocrit 36.0 - 46.0 % 33.6(L) 34.7(L) 35.3(L)  Platelets 150 - 400 K/uL 253 136(L) 272   CMP Latest Ref Rng & Units 10/29/2021 10/22/2021 10/15/2021  Glucose 70 - 99 mg/dL 99 108(H) 106(H)  BUN 8 - 23 mg/dL $Remove'19 15 14  'DkweTVj$ Creatinine 0.44 - 1.00 mg/dL 0.62 0.56 0.56  Sodium 135 - 145 mmol/L 136 139 140  Potassium 3.5 - 5.1 mmol/L 4.4 4.1 4.0  Chloride 98 - 111 mmol/L 106 107 107  CO2 22 - 32 mmol/L $RemoveB'25 26 27  'MDRpIAmS$ Calcium 8.9 - 10.3 mg/dL 8.9 9.1 9.2  Total Protein 6.5 - 8.1 g/dL 6.4(L) 6.6 6.7  Total Bilirubin 0.3 - 1.2 mg/dL 0.4 0.5 0.5  Alkaline Phos 38 - 126 U/L 30(L) 29(L) 27(L)  AST 15 - 41 U/L 15 15 14(L)  ALT 0 - 44 U/L $Remo'10 13 11      'JCgda$ RADIOGRAPHIC STUDIES: I have personally reviewed the radiological images as listed and agreed with the findings in the report. No results found.   ASSESSMENT & PLAN:   81 yo female  1. 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  She achieved complete response with negative M protein and negative PET scan  in December 2020. -She subsequently went on [place] maintenance therapy  PET scan from May 08, 2021, which showed focal activity within the right femur, consistent with active multiple myeloma, no other hypermetabolic lesion or plasmacytoma.  05/06/2021 bone marrow biopsy, which showed 3% plasma cells. PLAN -Patient is here for cycle 8-day 1 of carfilzomib dexamethasone treatment. -She notes no toxicities from  her carfilzomib. Labs done today show stable CBC and CMP. Myeloma panel today is pending  last myeloma panel on 09/24/2021 shows M spike of 0.3 g/dL. -Continue Zometa every 2 months -Reduce dexamethasone premedications from 10 mg down to 6 mg.  Recomended patient take additional dose of Tylenol in the evening on the days of her treatment. -We will get a repeat PET CT scan in 2 to 3 weeks to evaluate treatment response since the patient did have some FDG avid lesions in her sacrum and femur.  Will need this data prior to consideration of transition to maintenance carfilzomib.  2. Mild anemia -secondary to chemo   Follow-up Pet/ct in 3 weeks Plz scheduled next cycle of Carfilzomib per orders with portflush and labs with each treatment MD visit with C9D1. Continue Zometa every 2 months   The total time spent in the appointment was 32 minutes *  Sullivan Lone MD [place]  *Total Encounter Time as defined by the Centers for Medicare and Medicaid Services includes, in addition to the face-to-face time of a patient visit (documented in the note above) non-face-to-face time: obtaining and reviewing outside history, ordering and reviewing medications, tests or procedures, care coordination (communications with other health care professionals or caregivers) and documentation in the medical record.

## 2021-11-12 ENCOUNTER — Other Ambulatory Visit: Payer: Self-pay

## 2021-11-12 ENCOUNTER — Inpatient Hospital Stay: Payer: Medicare HMO | Attending: Hematology | Admitting: Hematology

## 2021-11-12 ENCOUNTER — Inpatient Hospital Stay: Payer: Medicare HMO

## 2021-11-12 ENCOUNTER — Other Ambulatory Visit: Payer: Medicare HMO

## 2021-11-12 ENCOUNTER — Ambulatory Visit: Payer: Medicare HMO

## 2021-11-12 VITALS — BP 168/82 | HR 64 | Temp 97.9°F | Resp 18 | Wt 105.2 lb

## 2021-11-12 DIAGNOSIS — D649 Anemia, unspecified: Secondary | ICD-10-CM | POA: Diagnosis not present

## 2021-11-12 DIAGNOSIS — C9000 Multiple myeloma not having achieved remission: Secondary | ICD-10-CM | POA: Insufficient documentation

## 2021-11-12 DIAGNOSIS — Z7189 Other specified counseling: Secondary | ICD-10-CM

## 2021-11-12 DIAGNOSIS — C9002 Multiple myeloma in relapse: Secondary | ICD-10-CM

## 2021-11-12 DIAGNOSIS — Z5111 Encounter for antineoplastic chemotherapy: Secondary | ICD-10-CM | POA: Insufficient documentation

## 2021-11-12 DIAGNOSIS — Z95828 Presence of other vascular implants and grafts: Secondary | ICD-10-CM

## 2021-11-12 DIAGNOSIS — T451X5A Adverse effect of antineoplastic and immunosuppressive drugs, initial encounter: Secondary | ICD-10-CM | POA: Diagnosis not present

## 2021-11-12 DIAGNOSIS — D6481 Anemia due to antineoplastic chemotherapy: Secondary | ICD-10-CM | POA: Insufficient documentation

## 2021-11-12 DIAGNOSIS — Z5112 Encounter for antineoplastic immunotherapy: Secondary | ICD-10-CM | POA: Diagnosis not present

## 2021-11-12 DIAGNOSIS — M8580 Other specified disorders of bone density and structure, unspecified site: Secondary | ICD-10-CM | POA: Diagnosis not present

## 2021-11-12 DIAGNOSIS — Z79899 Other long term (current) drug therapy: Secondary | ICD-10-CM | POA: Insufficient documentation

## 2021-11-12 LAB — CBC WITH DIFFERENTIAL (CANCER CENTER ONLY)
Abs Immature Granulocytes: 0.01 10*3/uL (ref 0.00–0.07)
Basophils Absolute: 0.1 10*3/uL (ref 0.0–0.1)
Basophils Relative: 1 %
Eosinophils Absolute: 0.1 10*3/uL (ref 0.0–0.5)
Eosinophils Relative: 3 %
HCT: 32 % — ABNORMAL LOW (ref 36.0–46.0)
Hemoglobin: 10.8 g/dL — ABNORMAL LOW (ref 12.0–15.0)
Immature Granulocytes: 0 %
Lymphocytes Relative: 9 %
Lymphs Abs: 0.4 10*3/uL — ABNORMAL LOW (ref 0.7–4.0)
MCH: 35.4 pg — ABNORMAL HIGH (ref 26.0–34.0)
MCHC: 33.8 g/dL (ref 30.0–36.0)
MCV: 104.9 fL — ABNORMAL HIGH (ref 80.0–100.0)
Monocytes Absolute: 0.5 10*3/uL (ref 0.1–1.0)
Monocytes Relative: 13 %
Neutro Abs: 3 10*3/uL (ref 1.7–7.7)
Neutrophils Relative %: 74 %
Platelet Count: 306 10*3/uL (ref 150–400)
RBC: 3.05 MIL/uL — ABNORMAL LOW (ref 3.87–5.11)
RDW: 13.3 % (ref 11.5–15.5)
WBC Count: 4.1 10*3/uL (ref 4.0–10.5)
nRBC: 0 % (ref 0.0–0.2)

## 2021-11-12 LAB — CMP (CANCER CENTER ONLY)
ALT: 11 U/L (ref 0–44)
AST: 13 U/L — ABNORMAL LOW (ref 15–41)
Albumin: 4.1 g/dL (ref 3.5–5.0)
Alkaline Phosphatase: 26 U/L — ABNORMAL LOW (ref 38–126)
Anion gap: 5 (ref 5–15)
BUN: 14 mg/dL (ref 8–23)
CO2: 27 mmol/L (ref 22–32)
Calcium: 8.9 mg/dL (ref 8.9–10.3)
Chloride: 108 mmol/L (ref 98–111)
Creatinine: 0.54 mg/dL (ref 0.44–1.00)
GFR, Estimated: >60 mL/min (ref >60)
Glucose, Bld: 101 mg/dL — ABNORMAL HIGH (ref 70–99)
Potassium: 3.9 mmol/L (ref 3.5–5.1)
Sodium: 140 mmol/L (ref 135–145)
Total Bilirubin: 0.4 mg/dL (ref 0.3–1.2)
Total Protein: 6.2 g/dL — ABNORMAL LOW (ref 6.5–8.1)

## 2021-11-12 MED ORDER — ONDANSETRON HCL 4 MG/2ML IJ SOLN
4.0000 mg | Freq: Once | INTRAMUSCULAR | Status: AC
  Administered 2021-11-12: 4 mg via INTRAVENOUS
  Filled 2021-11-12: qty 2

## 2021-11-12 MED ORDER — DEXTROSE 5 % IV SOLN
56.0000 mg/m2 | Freq: Once | INTRAVENOUS | Status: AC
  Administered 2021-11-12: 80 mg via INTRAVENOUS
  Filled 2021-11-12: qty 30

## 2021-11-12 MED ORDER — SODIUM CHLORIDE 0.9% FLUSH
10.0000 mL | INTRAVENOUS | Status: AC | PRN
  Administered 2021-11-12: 10 mL

## 2021-11-12 MED ORDER — SODIUM CHLORIDE 0.9% FLUSH
10.0000 mL | INTRAVENOUS | Status: DC | PRN
  Administered 2021-11-12: 10 mL

## 2021-11-12 MED ORDER — ACETAMINOPHEN 500 MG PO TABS
1000.0000 mg | ORAL_TABLET | Freq: Once | ORAL | Status: AC
  Administered 2021-11-12: 1000 mg via ORAL
  Filled 2021-11-12: qty 2

## 2021-11-12 MED ORDER — SODIUM CHLORIDE 0.9 % IV SOLN: 6.0000 mg | Freq: Once | INTRAVENOUS | Status: DC

## 2021-11-12 MED ORDER — DEXAMETHASONE SODIUM PHOSPHATE 10 MG/ML IJ SOLN
6.0000 mg | Freq: Once | INTRAMUSCULAR | Status: AC
  Administered 2021-11-12: 6 mg via INTRAVENOUS
  Filled 2021-11-12: qty 1

## 2021-11-12 MED ORDER — SODIUM CHLORIDE 0.9 % IV SOLN: Freq: Once | INTRAVENOUS | Status: AC

## 2021-11-12 MED ORDER — ZOLEDRONIC ACID 4 MG/100ML IV SOLN
4.0000 mg | Freq: Once | INTRAVENOUS | Status: AC
  Administered 2021-11-12: 4 mg via INTRAVENOUS
  Filled 2021-11-12: qty 100

## 2021-11-12 MED ORDER — HEPARIN SOD (PORK) LOCK FLUSH 100 UNIT/ML IV SOLN
500.0000 [IU] | Freq: Once | INTRAVENOUS | Status: AC | PRN
  Administered 2021-11-12: 500 [IU]

## 2021-11-13 ENCOUNTER — Telehealth: Payer: Self-pay | Admitting: Hematology

## 2021-11-13 LAB — KAPPA/LAMBDA LIGHT CHAINS
Kappa free light chain: 5.9 mg/L (ref 3.3–19.4)
Kappa, lambda light chain ratio: 0.13 — ABNORMAL LOW (ref 0.26–1.65)
Lambda free light chains: 46.3 mg/L — ABNORMAL HIGH (ref 5.7–26.3)

## 2021-11-13 NOTE — Telephone Encounter (Signed)
Scheduled follow-up appointments per 2/8 los. Patient is aware. 

## 2021-11-17 LAB — MULTIPLE MYELOMA PANEL, SERUM
Albumin SerPl Elph-Mcnc: 3.7 g/dL (ref 2.9–4.4)
Albumin/Glob SerPl: 1.8 — ABNORMAL HIGH (ref 0.7–1.7)
Alpha 1: 0.2 g/dL (ref 0.0–0.4)
Alpha2 Glob SerPl Elph-Mcnc: 0.6 g/dL (ref 0.4–1.0)
B-Globulin SerPl Elph-Mcnc: 1.1 g/dL (ref 0.7–1.3)
Gamma Glob SerPl Elph-Mcnc: 0.2 g/dL — ABNORMAL LOW (ref 0.4–1.8)
Globulin, Total: 2.1 g/dL — ABNORMAL LOW (ref 2.2–3.9)
IgA: 16 mg/dL — ABNORMAL LOW (ref 64–422)
IgG (Immunoglobin G), Serum: 651 mg/dL (ref 586–1602)
IgM (Immunoglobulin M), Srm: 16 mg/dL — ABNORMAL LOW (ref 26–217)
M Protein SerPl Elph-Mcnc: 0.4 g/dL — ABNORMAL HIGH
Total Protein ELP: 5.8 g/dL — ABNORMAL LOW (ref 6.0–8.5)

## 2021-11-17 NOTE — Progress Notes (Signed)
Chronic Care Management Pharmacy Note  12/01/2021 Name:  Megan Olson MRN:  [id number] DOB:  01-Feb-1941  Summary: Initial visit with PharmD.  Reviewed all meds, patient hopes to be entering maintenance phase of chemo shortly.  No issues with current regimen.  Recommendations/Changes made from today's visit: None  Plan: FU 1 year   Subjective: Megan Olson is an 81 y.o. year old female who is a primary patient of Hunter, Brayton Mars, MD.  The CCM team was consulted for assistance with disease management and care coordination needs.    Engaged with patient by telephone for initial visit in response to provider referral for pharmacy case management and/or care coordination services.   Consent to Services:  The patient was given the following information about Chronic Care Management services today, agreed to services, and gave verbal consent: 1. CCM service includes personalized support from designated clinical staff supervised by the primary care provider, including individualized plan of care and coordination with other care providers 2. 24/7 contact phone numbers for assistance for urgent and routine care needs. 3. Service will only be billed when office clinical staff spend 20 minutes or more in a month to coordinate care. 4. Only one practitioner may furnish and bill the service in a calendar month. 5.The patient may stop CCM services at any time (effective at the end of the month) by phone call to the office staff. 6. The patient will be responsible for cost sharing (co-pay) of up to 20% of the service fee (after annual deductible is met). Patient agreed to services and consent obtained.  Patient Care Team: [name], MD as PCP - General (Family Medicine) [name], MD as Consulting Physician (Hematology) Bond, [name], MD as Referring Physician (Ophthalmology) [name], [name], MD as Consulting Physician (Hematology and Oncology) [name], MD  as Consulting Physician (Internal Medicine) [name], MD as Consulting Physician (General Surgery) [name], [facility] as Pharmacist (Pharmacist)  Recent office visits:  10/24/2021 OV (PCP) [name], MD; Empiric treatment with: macrobid due to pcn and sulfa allergy.   08/04/2021 VV (PCP) [name], MD;  Considering patient is on Decadron and chemotherapy likely  immunosuppression increasing risk of bacterial disease-we will treat with doxycycline (penicillin allergy).  Considered azithromycin as she has tolerated erythromycin in the past but can prolong QT and needs with chemo-since she has not an office and I cannot get an EKG while on medication we thought doxycycline was a safer treatment option.   07/15/2021 OV Allwardt, Randa Evens, PA-C; -Doing well with amlodipine, will increase to 7.5 mg, and then consider 10 mg if needing additional control   Recent consult visits:  11/12/2021 OV (Oncology) [name], MD; Reduce dexamethasone premedications from 10 mg down to 6 mg. Plz scheduled next cycle of Carfilzomib per orders with portflush and labs with each treatment   10/15/2021 OV (Oncology) [name], MD; .Please schedule next 2 cycles of carfilzomib   09/17/2021 OV (Oncology) [name], MD; Please schedule next 2 cycles of carfilzomib as per orders   08/25/2021 OV (Ophthalmology) Edilia Bo, [name]; Dorzolamide-Timolol x2 OU Lumigan x1 OU qhs Alphagan P x3 OU Recommend ATs prn  Return in about 3 months (around 11/25/2021) for VF 30-2 OU, OCT RNFL   08/20/2021 OV (Oncology) [name], MD; -We discussed option to treat her myeloma more aggressively by adding third medication in addition to her daily to try to get to complete remission  prior to switching to maintenance treatment versus continuing current course of carfilzomib for additional few cycles till there is plateaued effect and then potentially switching to  maintenance carfilzomib every 2 weeks.   07/23/2021 OV (Oncology) [name], MD; Please schedule next 2 cycles of carfilzomib as per orders.   06/30/2021 OV (Endocrinology) [name], MD; no medication changes indicated.   06/23/2021 OV (Ophthalmology) Edilia Bo, [name] Dorzolamide-Timolol x2 OU Lumigan x1 OU qhs Alphagan P x3 OU Recommend ATs prn    05/28/2021 OV (Oncology) [name], MD; Follow-up with cycle 2-day 8 and cycle 2-day 15 of carfilzomib as scheduled with port flush and labs. Please schedule cycle 3 of carfilzomib and cycle 4 of carfilzomib   Hospital visits:  None in previous 6 months   Objective:  Lab Results  Component Value Date   CREATININE 0.52 11/26/2021   BUN 13 11/26/2021   GFR 83.73 11/28/2018   GFRNONAA >60 11/26/2021   GFRAA >60 06/13/2020   NA 141 11/26/2021   K 3.9 11/26/2021   CALCIUM 9.0 11/26/2021   CO2 27 11/26/2021   GLUCOSE 100 (H) 11/26/2021    Lab Results  Component Value Date/Time   HGBA1C 5.9 (A) 01/13/2021 09:26 AM   HGBA1C 5.9 (A) 06/18/2020 01:00 PM   HGBA1C 5.8 (H) 01/26/2020 04:15 PM   HGBA1C 6.3 04/04/2018 08:49 AM   GFR 83.73 11/28/2018 03:48 PM   GFR 89.15 04/04/2018 08:49 AM    Last diabetic Eye exam: No results found for: HMDIABEYEEXA  Last diabetic Foot exam: No results found for: HMDIABFOOTEX   Lab Results  Component Value Date   CHOL 220 (H) 01/26/2020   HDL 68 01/26/2020   LDLCALC 132 (H) 01/26/2020   TRIG 97 01/26/2020   CHOLHDL 3.2 01/26/2020    Hepatic Function Latest Ref Rng & Units 11/26/2021 11/19/2021 11/12/2021  Total Protein 6.5 - 8.1 g/dL 6.5 6.3(L) 6.2(L)  Albumin 3.5 - 5.0 g/dL 4.2 4.0 4.1  AST 15 - 41 U/L 13(L) 17 13(L)  ALT 0 - 44 U/L _0 Alk Phosphatase 38 - 126 U/L 28(L) 27(L) 26(L)  Total Bilirubin 0.3 - 1.2 mg/dL 0.5 0.6 0.4  Bilirubin, Direct 0.0 - 0.3 mg/dL - - -    Lab Results  Component Value Date/Time   TSH 2.74 08/11/2021 09:28 AM   TSH  7.50 (H) 06/30/2021 12:20 PM   FREET4 0.77 06/30/2021 12:20 PM   FREET4 0.86 03/17/2021 09:03 AM    CBC Latest Ref Rng & Units 11/26/2021 11/19/2021 11/12/2021  WBC 4.0 - 10.5 K/uL 3.8(L) 4.4 4.1  Hemoglobin 12.0 - 15.0 g/dL 10.9(L) 10.6(L) 10.8(L)  Hematocrit 36.0 - 46.0 % 32.2(L) 32.3(L) 32.0(L)  Platelets 150 - 400 K/uL 247 178 306    Lab Results  Component Value Date/Time   VD25OH 71.61 07/02/2021 09:11 AM   VD25OH 60.48 06/13/2020 11:12 AM    Clinical ASCVD: No  The ASCVD Risk score (Arnett DK, et al., 2019) failed to calculate for the following reasons:   The 2019 ASCVD risk score is only valid for ages 7 to 57    Depression screen PHQ 2/9 10/28/2021 01/27/2021 09/18/2019  Decreased Interest 0 0 0  Down, Depressed, Hopeless 0 0 0  PHQ - 2 Score 0 0 0  Some recent data might be hidden     Social History   Tobacco Use  Smoking Status Former   Packs/day: 0.50   Years: 7.00   Pack years: 3.50  Types: Cigarettes   Quit date: 01/04/1961   Years since quitting: 60.9  Smokeless Tobacco Never   BP Readings from Last 3 Encounters:  11/26/21 (!) 173/86  11/19/21 (!) 178/86  11/12/21 (!) 168/82   Pulse Readings from Last 3 Encounters:  11/26/21 72  11/19/21 65  11/12/21 64   Wt Readings from Last 3 Encounters:  11/26/21 105 lb 8 oz (47.9 kg)  11/19/21 103 lb (46.7 kg)  11/12/21 105 lb 3.2 oz (47.7 kg)   BMI Readings from Last 3 Encounters:  11/26/21 20.60 kg/m  11/19/21 20.12 kg/m  11/12/21 20.55 kg/m    Assessment/Interventions: Review of patient past medical history, allergies, medications, health status, including review of consultants reports, laboratory and other test data, was performed as part of comprehensive evaluation and provision of chronic care management services.   SDOH:  (Social Determinants of Health) assessments and interventions performed: Yes  Financial Resource Strain: Low Risk    Difficulty of Paying Living Expenses: Not hard at all    Food Insecurity: No Food Insecurity   Worried About Charity fundraiser in the Last Year: Never true   Arboriculturist in the Last Year: Never true    SDOH Screenings   Alcohol Screen: Not on file  Depression (PHQ2-9): Low Risk    PHQ-2 Score: 0  Financial Resource Strain: Low Risk    Difficulty of Paying Living Expenses: Not hard at all  Food Insecurity: No Food Insecurity   Worried About Charity fundraiser in the Last Year: Never true   Ran Out of Food in the Last Year: Never true  Housing: Low Risk    Last Housing Risk Score: 0  Physical Activity: Inactive   Days of Exercise per Week: 0 days   Minutes of Exercise per Session: 0 min  Social Connections: Moderately Integrated   Frequency of Communication with Friends and Family: More than three times a week   Frequency of Social Gatherings with Friends and Family: More than three times a week   Attends Religious Services: More than 4 times per year   Active Member of [employer] or Organizations: No   Attends Archivist Meetings: Never   Marital Status: Married  Stress: No Stress Concern Present   Feeling of Stress : Only a little  Tobacco Use: Medium Risk   Smoking Tobacco Use: Former   Smokeless Tobacco Use: Never   Passive Exposure: Not on file  Transportation Needs: No Transportation Needs   Lack of Transportation (Medical): No   Lack of Transportation (Non-Medical): No    CCM Care Plan  Allergies  Allergen Reactions   Ace Inhibitors Cough   Benadryl [Diphenhydramine] Other (See Comments)    Heart races   Diamox [Acetazolamide]     Hypotensive event at ophthalmologist   Sulfamethoxazole     REACTION: rash   Lenalidomide Rash   Penicillins Rash    REACTION: rash Did it involve swelling of the face/tongue/throat, SOB, or low BP? Yes Did it involve sudden or severe rash/hives, skin peeling, or any reaction on the inside of your mouth or nose? No Did you need to seek medical attention at a hospital or  doctor's office? No When did it last happen?      more than 10 years ago If all above answers are NO, may proceed with cephalosporin use.     Medications Reviewed Today     Reviewed by [name], [facility] (Pharmacist) on 12/01/21 at 1041  Med List Status: <None>   Medication Order Taking? Sig Documenting Provider Last Dose Status Informant  acyclovir (ZOVIRAX) 400 MG tablet [id number] Yes Take 1 tablet (400 mg total) by mouth 2 (two) times daily. [name], MD Taking Active   ALPHAGAN P 0.1 % Bailey Mech [id number] Yes Place 1 drop into both eyes in the morning, at noon, and at bedtime.  [provider] Taking Active Self  amLODipine (NORVASC) 10 MG tablet [id number] Yes Take 1 tablet (10 mg total) by mouth daily. [name], MD Taking Active   aspirin EC 81 MG tablet [id number] Yes Take 81 mg by mouth daily. [provider] Taking Active Self  bimatoprost (LUMIGAN) 0.03 % ophthalmic solution [id number] Yes Place 1 drop into both eyes at bedtime.  [provider] Taking Active Self  Cholecalciferol (VITAMIN D3) 125 MCG (5000 UT) TABS [id number] Yes Take 5,000 Units by mouth daily. [provider] Taking Active Self  Co-Enzyme Q-10 100 MG CAPS [id number] Yes Take 100 mg by mouth daily. [provider] Taking Active Self  dexamethasone (DECADRON) 4 MG tablet [id number] No Take 5 tablets ($RemoveBe'20mg'wZOzhcLht$ ) on day 22 of each cycle. Repeat every 28 days.  Take with breakfast.  Patient not taking: Reported on 12/01/2021   [name], MD Not Taking Active   dorzolamide-timolol (COSOPT) 22.3-6.8 MG/ML ophthalmic solution [id number] Yes Place 1 drop into both eyes 2 (two) times daily.  [provider] Taking Active Self  fentaNYL (DURAGESIC) 12 MCG/HR [id number] Yes Place 1 patch onto the skin every 3 (three) days. [name], MD Taking Active   levothyroxine (SYNTHROID) 88 MCG tablet [id number] Yes Take 1 tablet (88 mcg total) by mouth  daily before breakfast. [name], MD Taking Active   lidocaine-prilocaine (EMLA) cream [id number] Yes Apply 1 application topically as needed. Apply prior to port access. [name], MD Taking Active   magnesium chloride (SLOW-MAG) 64 MG TBEC SR tablet [id number] Yes Take by mouth. [provider] Taking Active   Multiple Vitamins-Minerals (MULTIVITAMIN WITH MINERALS) tablet [id number] Yes Take 1 tablet by mouth daily. Centrum Silver 50 + [provider] Taking Active Self  Omega-3 1000 MG CAPS [id number] Yes Take 1,000 mg by mouth daily.  [provider] Taking Active Self  ondansetron (ZOFRAN) 8 MG tablet [id number] No Take 8 mg by mouth 30 to 60 min prior to Cytoxan administration then take 8 mg twice daily as needed for nausea and vomiting.  Patient not taking: Reported on 12/01/2021   [name], MD Not Taking Active            Med Note (MINOR, Milderd Meager   Fri May 16, 2021  1:16 PM) Not needed  oxyCODONE-acetaminophen (PERCOCET) 5-325 MG tablet [id number] Yes Take 1-2 tablets by mouth every 4 (four) hours as needed for moderate pain or severe pain. [name], MD Taking Active   PFIZER COVID-19 [facility] BIVALENT injection [id number] Yes  [provider] Taking Active   polyethylene glycol [facility]) packet [id number] Yes Take 17 g by mouth daily. [name], MD Taking Active Self  prochlorperazine (COMPAZINE) 10 MG tablet [id number] No Take 1 tablet (10 mg total) by mouth every 6 (six) hours as needed (Nausea or vomiting).  Patient not taking: Reported on 10/28/2021   [name], MD Not Taking Active            Med Note (MINOR, Milderd Meager   Fri May 16, 2021  1:16 PM)  Not needed  Simethicone (GAS-X PO) [id number] Yes Take 1 tablet by mouth as needed. [provider] Taking Active   vitamin C (ASCORBIC ACID) 500 MG tablet [id number] Yes Take 500 mg by mouth 2 (two) times a week.  [provider] Taking  Active Self           Med Note Jesse Fall   Tue Oct 17, 2019  9:01 AM) Taking every other day  Med List Note Darl Pikes, [place] 10/03/19 9518): Ninlaro filled at [facility]            Patient Active Problem List   Diagnosis Date Noted   Multiple myeloma in relapse ([place]) 04/16/2021   Papillary thyroid carcinoma ([place]) 08/01/2020   Prediabetes 06/18/2020   Left thyroid nodule 06/18/2020   Multiple myeloma not having achieved remission ([place]) 12/12/2018   Counseling regarding advance care planning and goals of care 12/12/2018   Pain in thoracic spine 11/03/2018   Primary open angle glaucoma (POAG) of right eye, moderate stage 10/11/2017   Combined forms of age-related cataract of left eye 04/28/2017   History of adenomatous polyp of colon 07/23/2015   Hyperglycemia 03/29/2015   Raynaud phenomenon 10/19/2014   Syncope 10/19/2014   Hyperlipidemia 10/18/2014   Glaucoma 01/05/2011   Essential hypertension 03/11/2007   Osteoporosis 03/11/2007    Immunization History  Administered Date(s) Administered   Fluad Quad(high Dose 65+) 06/13/2019, 07/03/2020, 06/17/2021   Influenza Split 07/19/2012   Influenza Whole 07/17/2008, 06/28/2009, 06/25/2010   Influenza, High Dose Seasonal PF 07/22/2016, 06/24/2017, 06/23/2018   Influenza,inj,Quad PF,6+ Mos 06/27/2013, 06/18/2014   Influenza-Unspecified 07/03/2015   PFIZER(Purple Top)SARS-COV-2 Vaccination 11/09/2019, 12/04/2019, 06/03/2020, 01/09/2021, 07/04/2021   PNEUMOCOCCAL CONJUGATE-20 05/21/2021   Pneumococcal Conjugate-13 03/29/2015   Pneumococcal Polysaccharide-23 04/04/2006   Td 02/10/2010   Zoster Recombinat (Shingrix) 03/27/2020, 09/06/2020   Zoster, Live 09/26/2008    Conditions to be addressed/monitored:  HTN, Osteoporosis, HLD, Pre-DM, Multiple Myeloma  Care Plan : General Pharmacy (Adult)  Updates made by [name], RPH since 12/01/2021 12:00 AM     Problem: HTN, Osteoporosis, HLD,  Pre-DM, Multiple Myeloma   Priority: High  Onset Date: 12/01/2021     Long-Range Goal: Patient-Specific Goal   Start Date: 12/01/2021  Expected End Date: 12/01/2022  This Visit's Progress: On track  Priority: High  Note:   Current Barriers:  Chemotherapy/cancer treatment  Pharmacist Clinical Goal(s):  Patient will contact provider office for questions/concerns as evidenced notation of same in electronic health record through collaboration with PharmD and provider.   Interventions: 1:1 collaboration with [name], MD regarding development and update of comprehensive plan of care as evidenced by provider attestation and co-signature Inter-disciplinary care team collaboration (see longitudinal plan of care) Comprehensive medication review performed; medication list updated in electronic medical record  Hypertension (BP goal <130/80) -Controlled -Current treatment: Amlodipine 10mg  daily Appropriate, Effective, Safe, Accessible -Medications previously tried: none noted  -Current home readings: 132/77, 122/79, 125/78, 129/75, 130/79, 126/79 (best of 3 at a time) -Current dietary habits: eats a well balanced diet, no eating out -Current exercise habits: walks almost daily weather permitting with husband -Denies hypotensive/hypertensive symptoms -Educated on BP goals and benefits of medications for prevention of heart attack, stroke and kidney damage; Exercise goal of 150 minutes per week; Symptoms of hypotension and importance of maintaining adequate hydration; -Counseled to monitor BP at home a few times per week, document, and provide log at future appointments -Recommended to continue current medication No  changes needed - new dose of 10mg  seems to be adequately controlling BP.  Hyperlipidemia: (LDL goal < 100) -Uncontrolled -Current treatment: None -Medications previously tried: none  -Current dietary patterns: see HTN -Current exercise habits: see HTn -Educated on  Cholesterol goals;  Benefits of statin for ASCVD risk reduction; Importance of limiting foods high in cholesterol; -Recommended continue current management, she does not want to take statin medication.  Have not checked lipids since 2021.  Could consider recheck at next OV.  Pre-Diabetes (A1c goal <6.5%) -Controlled -Current medications: None -Medications previously tried: none  -Current home glucose readings fasting glucose: not checking post prandial glucose: not checking -Denies hypoglycemic/hyperglycemic symptoms -Current exercise: walks almost daily with husbadn -Educated on A1c and blood sugar goals; Effect of steroid on blood glucose -Counseled to check feet daily and get yearly eye exams -Recommended continue routine screenings, she is cutting back on the amount of dexamethasone she is taking which should continue to help her A1c decrease.  Osteoporosis (Goal Prevent fracture) -Controlled -Last DEXA Scan: 2014    T-Score lumbar spine: -2.3  -Patient is not a candidate for pharmacologic treatment -Current treatment  Vitamin D 5000 IU tabs daily Appropriate, Effective, Safe, Accessible -Medications previously tried: none noted  -Recommend weight-bearing and muscle strengthening exercises for building and maintaining bone density. -Recommended to continue current medication Hopes to get back to strength training as she is on maintenance from myeloma  Multiple Myeloma (Goal: Remission) -Controlled -Current treatment  Fentanyl 57mcg/HR Appropriate, Effective, Safe, Accessible Oxycodone 5-325mg  one half tablet 2 to 3 times per day Appropriate, Effective, Safe, Accessible Carfilzomib 80mg  Appropriate, Effective, Safe, Accessible -Medications previously tried: none noted -She reports pain is currently a 2 of 10.  She is not having to use much of the Percocet for breakthrough except for on a day where she may do more strenuous activity than normal.  Hopes to be in maintenance  phase of chemo soon which she thinks would be two weeks out of the month every other week) on the same treatment she is currently on.  -Recommended to continue current medication Seems to be doing well overall with chemo and pain management.  Denies any issues with constipation a this time.  Patient Goals/Self-Care Activities Patient will:  - take medications as prescribed as evidenced by patient report and record review check blood pressure a few times per week, document, and provide at future appointments  Follow Up Plan: The care management team will reach out to the patient again over the next 365 days.         Medication Assistance: None required.  Patient affirms current coverage meets needs.  Compliance/Adherence/Medication fill history: Care Gaps: None at this time  Star-Rating Drugs: N/A  Patient's preferred pharmacy is:  [facility] [address] [place] [address] [place] [address] Phone: [phone] Fax: [phone]  [facility] ORDER) [place], [place] [address] Phone: [phone] Fax: [phone]  [place]. [place] [address] Phone: [phone] Fax: [phone]  Uses pill box? Yes Pt endorses 100% compliance  We discussed: Benefits of medication synchronization, packaging and delivery as well as enhanced pharmacist oversight with [employer]. Patient decided to: Continue current medication management strategy  Care Plan and Follow Up Patient Decision:  Patient agrees to Care Plan and Follow-up.  Plan: The care management team will reach out to the patient again over the next  365 days.  Beverly Milch, PharmD Clinical Pharmacist  [facility] [phone]

## 2021-11-18 ENCOUNTER — Other Ambulatory Visit: Payer: Self-pay

## 2021-11-18 DIAGNOSIS — C9002 Multiple myeloma in relapse: Secondary | ICD-10-CM

## 2021-11-19 ENCOUNTER — Other Ambulatory Visit: Payer: Self-pay

## 2021-11-19 ENCOUNTER — Inpatient Hospital Stay: Payer: Medicare HMO

## 2021-11-19 VITALS — BP 178/86 | HR 65 | Temp 98.5°F | Resp 16 | Wt 103.0 lb

## 2021-11-19 DIAGNOSIS — Z7189 Other specified counseling: Secondary | ICD-10-CM

## 2021-11-19 DIAGNOSIS — C9000 Multiple myeloma not having achieved remission: Secondary | ICD-10-CM | POA: Diagnosis not present

## 2021-11-19 DIAGNOSIS — Z95828 Presence of other vascular implants and grafts: Secondary | ICD-10-CM

## 2021-11-19 DIAGNOSIS — C9002 Multiple myeloma in relapse: Secondary | ICD-10-CM

## 2021-11-19 DIAGNOSIS — Z79899 Other long term (current) drug therapy: Secondary | ICD-10-CM | POA: Diagnosis not present

## 2021-11-19 DIAGNOSIS — Z5112 Encounter for antineoplastic immunotherapy: Secondary | ICD-10-CM | POA: Diagnosis not present

## 2021-11-19 DIAGNOSIS — M8580 Other specified disorders of bone density and structure, unspecified site: Secondary | ICD-10-CM | POA: Diagnosis not present

## 2021-11-19 DIAGNOSIS — T451X5A Adverse effect of antineoplastic and immunosuppressive drugs, initial encounter: Secondary | ICD-10-CM | POA: Diagnosis not present

## 2021-11-19 DIAGNOSIS — D649 Anemia, unspecified: Secondary | ICD-10-CM | POA: Diagnosis not present

## 2021-11-19 DIAGNOSIS — D6481 Anemia due to antineoplastic chemotherapy: Secondary | ICD-10-CM | POA: Diagnosis not present

## 2021-11-19 DIAGNOSIS — Z5111 Encounter for antineoplastic chemotherapy: Secondary | ICD-10-CM | POA: Diagnosis not present

## 2021-11-19 LAB — CBC WITH DIFFERENTIAL (CANCER CENTER ONLY)
Abs Immature Granulocytes: 0.03 10*3/uL (ref 0.00–0.07)
Basophils Absolute: 0.1 10*3/uL (ref 0.0–0.1)
Basophils Relative: 1 %
Eosinophils Absolute: 0.1 10*3/uL (ref 0.0–0.5)
Eosinophils Relative: 2 %
HCT: 32.3 % — ABNORMAL LOW (ref 36.0–46.0)
Hemoglobin: 10.6 g/dL — ABNORMAL LOW (ref 12.0–15.0)
Immature Granulocytes: 1 %
Lymphocytes Relative: 7 %
Lymphs Abs: 0.3 10*3/uL — ABNORMAL LOW (ref 0.7–4.0)
MCH: 34.6 pg — ABNORMAL HIGH (ref 26.0–34.0)
MCHC: 32.8 g/dL (ref 30.0–36.0)
MCV: 105.6 fL — ABNORMAL HIGH (ref 80.0–100.0)
Monocytes Absolute: 0.7 10*3/uL (ref 0.1–1.0)
Monocytes Relative: 16 %
Neutro Abs: 3.2 10*3/uL (ref 1.7–7.7)
Neutrophils Relative %: 73 %
Platelet Count: 178 10*3/uL (ref 150–400)
RBC: 3.06 MIL/uL — ABNORMAL LOW (ref 3.87–5.11)
RDW: 13 % (ref 11.5–15.5)
WBC Count: 4.4 10*3/uL (ref 4.0–10.5)
nRBC: 0 % (ref 0.0–0.2)

## 2021-11-19 LAB — CMP (CANCER CENTER ONLY)
ALT: 13 U/L (ref 0–44)
AST: 17 U/L (ref 15–41)
Albumin: 4 g/dL (ref 3.5–5.0)
Alkaline Phosphatase: 27 U/L — ABNORMAL LOW (ref 38–126)
Anion gap: 4 — ABNORMAL LOW (ref 5–15)
BUN: 13 mg/dL (ref 8–23)
CO2: 26 mmol/L (ref 22–32)
Calcium: 8.9 mg/dL (ref 8.9–10.3)
Chloride: 108 mmol/L (ref 98–111)
Creatinine: 0.38 mg/dL — ABNORMAL LOW (ref 0.44–1.00)
GFR, Estimated: >60 mL/min (ref >60)
Glucose, Bld: 106 mg/dL — ABNORMAL HIGH (ref 70–99)
Potassium: 4 mmol/L (ref 3.5–5.1)
Sodium: 138 mmol/L (ref 135–145)
Total Bilirubin: 0.6 mg/dL (ref 0.3–1.2)
Total Protein: 6.3 g/dL — ABNORMAL LOW (ref 6.5–8.1)

## 2021-11-19 MED ORDER — SODIUM CHLORIDE 0.9 % IV SOLN: Freq: Once | INTRAVENOUS | Status: AC

## 2021-11-19 MED ORDER — ONDANSETRON HCL 4 MG/2ML IJ SOLN
4.0000 mg | Freq: Once | INTRAMUSCULAR | Status: AC
  Administered 2021-11-19: 4 mg via INTRAVENOUS
  Filled 2021-11-19: qty 2

## 2021-11-19 MED ORDER — DEXTROSE 5 % IV SOLN
56.0000 mg/m2 | Freq: Once | INTRAVENOUS | Status: AC
  Administered 2021-11-19: 80 mg via INTRAVENOUS
  Filled 2021-11-19: qty 30

## 2021-11-19 MED ORDER — HEPARIN SOD (PORK) LOCK FLUSH 100 UNIT/ML IV SOLN
500.0000 [IU] | Freq: Once | INTRAVENOUS | Status: AC | PRN
  Administered 2021-11-19: 500 [IU]

## 2021-11-19 MED ORDER — DEXAMETHASONE SODIUM PHOSPHATE 10 MG/ML IJ SOLN
6.0000 mg | Freq: Once | INTRAMUSCULAR | Status: AC
  Administered 2021-11-19: 6 mg via INTRAVENOUS
  Filled 2021-11-19: qty 1

## 2021-11-19 MED ORDER — SODIUM CHLORIDE 0.9% FLUSH
10.0000 mL | INTRAVENOUS | Status: AC | PRN
  Administered 2021-11-19: 10 mL

## 2021-11-19 MED ORDER — SODIUM CHLORIDE 0.9% FLUSH
10.0000 mL | INTRAVENOUS | Status: DC | PRN
  Administered 2021-11-19: 10 mL

## 2021-11-19 MED ORDER — SODIUM CHLORIDE 0.9 % IV SOLN: 6.0000 mg | Freq: Once | INTRAVENOUS | Status: DC

## 2021-11-19 MED ORDER — ACETAMINOPHEN 500 MG PO TABS
1000.0000 mg | ORAL_TABLET | Freq: Once | ORAL | Status: AC
  Administered 2021-11-19: 1000 mg via ORAL
  Filled 2021-11-19: qty 2

## 2021-11-19 NOTE — Patient Instructions (Signed)
[facility]ICAL [facility]COLOG[facility]  Discharge Instructions: Thank you for choosing [facility] to provide your oncology and hematology care.   If you have a lab appointment with the Cancer Center, please go directly to the Cancer Center and check in at the registration area.   Wear comfortable clothing and clothing appropriate for easy access to any Portacath or PICC line.   We strive to give you quality time with your provider. You may need to reschedule your appointment if you arrive late (15 or more minutes).  Arriving late affects you and other patients whose appointments are after yours.  Also, if you miss three or more appointments without notifying the office, you may be dismissed from the clinic at the provider's discretion.      For prescription refill requests, have your pharmacy contact our office and allow 72 hours for refills to be completed.    Today you received the following chemotherapy and/or immunotherapy agents kyprolis      To help prevent nausea and vomiting after your treatment, we encourage you to take your nausea medication as directed.  BELOW ARE SYMPTOMS THAT SHOULD BE REPORTED IMMEDIATELY: . *FEVER GREATER THAN 100.4 F (38 C) OR HIGHER . *CHILLS OR SWEATING . *NAUSEA AND VOMITING THAT IS NOT CONTROLLED WITH YOUR NAUSEA MEDICATION . *UNUSUAL SHORTNESS OF BREATH . *UNUSUAL BRUISING OR BLEEDING . *URINARY PROBLEMS (pain or burning when urinating, or frequent urination) . *BOWEL PROBLEMS (unusual diarrhea, constipation, pain near the anus) . TENDERNESS IN MOUTH AND THROAT WITH OR WITHOUT PRESENCE OF ULCERS (sore throat, sores in mouth, or a toothache) . UNUSUAL RASH, SWELLING OR PAIN  . UNUSUAL VAGINAL DISCHARGE OR ITCHING   Items with * indicate a potential emergency and should be followed up as soon as possible or go to the Emergency Department if any problems should occur.  Please show the CHEMOTHERAPY ALERT CARD or IMMUNOTHERAPY ALERT  CARD at check-in to the Emergency Department and triage nurse.  Should you have questions after your visit or need to cancel or reschedule your appointment, please contact [facility]  Dept: [phone]  and follow the prompts.  Office hours are 8:00 a.m. to 4:30 p.m. Monday - Friday. Please note that voicemails left after 4:00 p.m. may not be returned until the following business day.  We are closed weekends and major holidays. You have access to a nurse at all times for urgent questions. Please call the main number to the clinic Dept: [phone] and follow the prompts.   For any non-urgent questions, you may also contact your provider using MyChart. We now offer e-Visits for anyone 18 and older to request care online for non-urgent symptoms. For details visit [email or web address].   Also download the MyChart app! Go to the app store, search "MyChart", open the app, select [facility], and log in with your MyChart username and password.  Due to Covid, a mask is required upon entering the hospital/clinic. If you do not have a mask, one will be given to you upon arrival. For doctor visits, patients may have 1 support person aged 18 or older with them. For treatment visits, patients cannot have anyone with them due to current Covid guidelines and our immunocompromised population.   

## 2021-11-20 ENCOUNTER — Other Ambulatory Visit: Payer: Self-pay

## 2021-11-20 DIAGNOSIS — C9000 Multiple myeloma not having achieved remission: Secondary | ICD-10-CM

## 2021-11-21 ENCOUNTER — Encounter: Payer: Self-pay | Admitting: Hematology

## 2021-11-21 MED ORDER — FENTANYL 12 MCG/HR TD PT72: 1.0000 | MEDICATED_PATCH | TRANSDERMAL | 0 refills | Status: DC

## 2021-11-24 ENCOUNTER — Telehealth: Payer: Self-pay | Admitting: Pharmacist

## 2021-11-24 DIAGNOSIS — H401122 Primary open-angle glaucoma, left eye, moderate stage: Secondary | ICD-10-CM | POA: Diagnosis not present

## 2021-11-24 DIAGNOSIS — H401112 Primary open-angle glaucoma, right eye, moderate stage: Secondary | ICD-10-CM | POA: Diagnosis not present

## 2021-11-24 NOTE — Progress Notes (Signed)
Chronic Care Management Pharmacy Assistant   Name: Megan Olson  MRN: [id number] DOB: Dec 01, 1940   Reason for Encounter: Chart Reviw For Initial Visit With Clinical Pharmacist   Conditions to be addressed/monitored: HTN, Papillary thyroid carcinoma, Osteoporosis, HLD, Hyperglycemia, Multiple myeloma  Primary concerns for visit include: HTN, HLD   Recent office visits:  10/24/2021 OV (PCP) [name], MD; Empiric treatment with: macrobid due to pcn and sulfa allergy.  08/04/2021 VV (PCP) [name], MD;  Considering patient is on Decadron and chemotherapy likely  immunosuppression increasing risk of bacterial disease-we will treat with doxycycline (penicillin allergy).  Considered azithromycin as she has tolerated erythromycin in the past but can prolong QT and needs with chemo-since she has not an office and I cannot get an EKG while on medication we thought doxycycline was a safer treatment option.  07/15/2021 OV [place], Randa Evens, PA-C; -Doing well with amlodipine, will increase to 7.5 mg, and then consider 10 mg if needing additional control  Recent consult visits:  11/12/2021 OV (Oncology) [name], MD; Reduce dexamethasone premedications from 10 mg down to 6 mg. Plz scheduled next cycle of Carfilzomib per orders with portflush and labs with each treatment  10/15/2021 OV (Oncology) [name], MD; .Please schedule next 2 cycles of carfilzomib  09/17/2021 OV (Oncology) [name], MD; Please schedule next 2 cycles of carfilzomib as per orders  08/25/2021 OV (Ophthalmology) Edilia Bo, [name]; Dorzolamide-Timolol x2 OU Lumigan x1 OU qhs Alphagan P x3 OU Recommend ATs prn  Return in about 3 months (around 11/25/2021) for VF 30-2 OU, OCT RNFL  08/20/2021 OV (Oncology) [name], MD; -We discussed option to treat her myeloma more aggressively by adding third medication in addition to her daily to try to get to complete  remission prior to switching to maintenance treatment versus continuing current course of carfilzomib for additional few cycles till there is plateaued effect and then potentially switching to maintenance carfilzomib every 2 weeks.  07/23/2021 OV (Oncology) [name], MD; Please schedule next 2 cycles of carfilzomib as per orders.  06/30/2021 OV (Endocrinology) [name], MD; no medication changes indicated.  06/23/2021 OV (Ophthalmology) Edilia Bo, [name] Dorzolamide-Timolol x2 OU Lumigan x1 OU qhs Alphagan P x3 OU Recommend ATs prn   05/28/2021 OV (Oncology) [name], MD; Follow-up with cycle 2-day 8 and cycle 2-day 15 of carfilzomib as scheduled with port flush and labs. Please schedule cycle 3 of carfilzomib and cycle 4 of carfilzomib  Hospital visits:  None in previous 6 months  Medications: Outpatient Encounter Medications as of 11/24/2021  Medication Sig Note   acyclovir (ZOVIRAX) 400 MG tablet Take 1 tablet (400 mg total) by mouth 2 (two) times daily.    ALPHAGAN P 0.1 % SOLN Place 1 drop into both eyes in the morning, at noon, and at bedtime.     amLODipine (NORVASC) 10 MG tablet Take 1 tablet (10 mg total) by mouth daily.    aspirin EC 81 MG tablet Take 81 mg by mouth daily.    bimatoprost (LUMIGAN) 0.03 % ophthalmic solution Place 1 drop into both eyes at bedtime.     Cholecalciferol (VITAMIN D3) 125 MCG (5000 UT) TABS Take 5,000 Units by mouth daily.    Co-Enzyme Q-10 100 MG CAPS Take 100 mg by mouth daily.    dexamethasone (DECADRON) 4 MG tablet Take 5 tablets ($RemoveBe'20mg'iRbuRAtXE$ ) on day 22 of each cycle. Repeat every 28 days.  Take with breakfast.  dorzolamide-timolol (COSOPT) 22.3-6.8 MG/ML ophthalmic solution Place 1 drop into both eyes 2 (two) times daily.     fentaNYL (DURAGESIC) 12 MCG/HR Place 1 patch onto the skin every 3 (three) days.    levothyroxine (SYNTHROID) 88 MCG tablet Take 1 tablet (88 mcg total) by mouth daily before breakfast.     lidocaine-prilocaine (EMLA) cream Apply 1 application topically as needed. Apply prior to port access.    magnesium chloride (SLOW-MAG) 64 MG TBEC SR tablet Take by mouth.    Multiple Vitamins-Minerals (MULTIVITAMIN WITH MINERALS) tablet Take 1 tablet by mouth daily. Centrum Silver 50 +    nitrofurantoin, macrocrystal-monohydrate, (MACROBID) 100 MG capsule Take 1 capsule (100 mg total) by mouth 2 (two) times daily. (Patient not taking: Reported on 11/12/2021)    Omega-3 1000 MG CAPS Take 1,000 mg by mouth daily.     ondansetron (ZOFRAN) 8 MG tablet Take 8 mg by mouth 30 to 60 min prior to Cytoxan administration then take 8 mg twice daily as needed for nausea and vomiting. 05/16/2021: Not needed   oxyCODONE-acetaminophen (PERCOCET) 5-325 MG tablet Take 1-2 tablets by mouth every 4 (four) hours as needed for moderate pain or severe pain.    PFIZER COVID-19 VAC BIVALENT injection     polyethylene glycol (MIRALAX) packet Take 17 g by mouth daily.    prochlorperazine (COMPAZINE) 10 MG tablet Take 1 tablet (10 mg total) by mouth every 6 (six) hours as needed (Nausea or vomiting). (Patient not taking: Reported on 10/28/2021) 05/16/2021: Not needed   Simethicone (GAS-X PO) Take 1 tablet by mouth as needed.    vitamin C (ASCORBIC ACID) 500 MG tablet Take 500 mg by mouth 2 (two) times a week.  10/17/2019: Taking every other day   No facility-administered encounter medications on file as of 11/24/2021.   Current Medications: Fentanyl 12 mcg/hr last filled 10/21/2021 30 DS Acyclovir 400 mg last filled 10/27/2021 30 DS Macrobid 100 mg last filled 10/24/2021 7 DS  Amlodipine 10 mg last filled 10/13/2021 90 DS Oxycodone-Acetaminophen 5-325 mg last filled 09/17/2021 Levothyroxine 88 mcg last filled 09/24/2021 90 DS Emla cream last filled 05/29/2021 30 DS Magnesium Chloride takes daily Zofran 8 mg last filled 04/16/2021 15 DS - does not take per patient Prochlorperazine 10 mg last filled 04/16/2021 8 DS   Dexamethasone 4 mg last filled 09/02/2021 30 DS Simethicone - takes daily Omega-3 1000 mg take daily Vitamin C 500 mg takes threes times a week Aspirin 81 mg takes daily Miralax takes daily Dorzolamide-timolol 22.3-6.8 mg/mL last filled 09/24/2021 38 DS Alphagan 0.1% solution last filled 10/30/2021 38 DS Bimatoprost 0.03% ophthalmic solution last filled 10/14/2021 56 DS Co-Enzyme Q-10 100 mg takes daily Multiple Vitamin with Minerals takes daily Vitamin D3 125 mcg takes  Patient Questions: Any changes in your medications or health? "Nothing that doctor Hunter's office doesn't know about."  Any side effects from any medications?  Patient denies having any side effects from any of her medications.  Do you have any symptoms or problems not managed by your medications? Patient denies having any symptoms or problems that are not currently managed by her medication.  Any concerns about your health right now? "Nothing other than cancer."  Has your provider asked that you check blood pressure, blood sugar, or follow special diet at home? Patient does monitor her blood pressure, she does not monitor blood sugar. She states she doesn't follow any specific diet but she is trying to gain weight.  Do you get any type  of exercise on a regular basis? "I walk on a regular basis around the neighborhood and stay active around the house."  Can you think of a goal you would like to reach for your health? "To stay as healthy as I can."  Do you have any problems getting your medications? Patient states she does not have any problems getting her medications.  Is there anything that you would like to discuss during the appointment?  Patient states she would like to discuss her medications.  Please bring medications and supplements to appointment  Care Gaps: Medicare Annual Wellness: Completed 10/28/2021 Hemoglobin A1C: 5.8% on 01/26/2020 Colonoscopy: Discontinued Dexa Scan:  Completed Mammogram: last completed 02/08/2018  Future Appointments  Date Time Provider [place]  11/26/2021  8:30 AM [facility] [place] None  11/26/2021  9:30 AM [facility] INFUSION [facility] None  12/01/2021  9:30 AM [facility] CCM PHARMACIST [facility] PEC  12/04/2021  8:00 AM [facility] PET CT 1 [facility] [place]  12/10/2021  9:00 AM [facility] [place] FLUSH [facility] None  12/10/2021 10:00 AM [facility] INFUSION [facility] None  12/10/2021 10:20 AM [name], MD [facility] None  12/17/2021  8:30 AM [facility] INFUSION [facility] None  12/17/2021  9:30 AM [facility] INFUSION [facility] None  12/24/2021  8:30 AM [facility] [place] FLUSH [facility] None  12/24/2021  9:30 AM [facility] INFUSION [facility] None  12/29/2021  9:40 AM [name], MD [facility] None  01/07/2022  8:30 AM [facility] [place] FLUSH [facility] None  01/07/2022  9:30 AM [facility] INFUSION [facility] None  02/02/2022  9:20 AM [name], MD [facility] PEC  11/12/2022  8:45 AM [facility] HEALTH COACH [facility] PEC    Star Rating Drugs: None  April D Calhoun, [place] Pharmacist Assistant [phone]

## 2021-11-25 ENCOUNTER — Other Ambulatory Visit: Payer: Self-pay

## 2021-11-25 DIAGNOSIS — C9002 Multiple myeloma in relapse: Secondary | ICD-10-CM

## 2021-11-26 ENCOUNTER — Inpatient Hospital Stay: Payer: Medicare HMO

## 2021-11-26 ENCOUNTER — Other Ambulatory Visit: Payer: Self-pay

## 2021-11-26 VITALS — BP 173/86 | HR 72 | Temp 98.5°F | Resp 16 | Wt 105.5 lb

## 2021-11-26 DIAGNOSIS — Z7189 Other specified counseling: Secondary | ICD-10-CM

## 2021-11-26 DIAGNOSIS — T451X5A Adverse effect of antineoplastic and immunosuppressive drugs, initial encounter: Secondary | ICD-10-CM | POA: Diagnosis not present

## 2021-11-26 DIAGNOSIS — M8580 Other specified disorders of bone density and structure, unspecified site: Secondary | ICD-10-CM | POA: Diagnosis not present

## 2021-11-26 DIAGNOSIS — Z79899 Other long term (current) drug therapy: Secondary | ICD-10-CM | POA: Diagnosis not present

## 2021-11-26 DIAGNOSIS — D6481 Anemia due to antineoplastic chemotherapy: Secondary | ICD-10-CM | POA: Diagnosis not present

## 2021-11-26 DIAGNOSIS — Z5111 Encounter for antineoplastic chemotherapy: Secondary | ICD-10-CM | POA: Diagnosis not present

## 2021-11-26 DIAGNOSIS — D649 Anemia, unspecified: Secondary | ICD-10-CM | POA: Diagnosis not present

## 2021-11-26 DIAGNOSIS — Z5112 Encounter for antineoplastic immunotherapy: Secondary | ICD-10-CM | POA: Diagnosis not present

## 2021-11-26 DIAGNOSIS — C9002 Multiple myeloma in relapse: Secondary | ICD-10-CM

## 2021-11-26 DIAGNOSIS — Z95828 Presence of other vascular implants and grafts: Secondary | ICD-10-CM

## 2021-11-26 DIAGNOSIS — C9000 Multiple myeloma not having achieved remission: Secondary | ICD-10-CM | POA: Diagnosis not present

## 2021-11-26 LAB — CBC WITH DIFFERENTIAL (CANCER CENTER ONLY)
Abs Immature Granulocytes: 0.03 10*3/uL (ref 0.00–0.07)
Basophils Absolute: 0 10*3/uL (ref 0.0–0.1)
Basophils Relative: 1 %
Eosinophils Absolute: 0.1 10*3/uL (ref 0.0–0.5)
Eosinophils Relative: 3 %
HCT: 32.2 % — ABNORMAL LOW (ref 36.0–46.0)
Hemoglobin: 10.9 g/dL — ABNORMAL LOW (ref 12.0–15.0)
Immature Granulocytes: 1 %
Lymphocytes Relative: 9 %
Lymphs Abs: 0.3 10*3/uL — ABNORMAL LOW (ref 0.7–4.0)
MCH: 35.7 pg — ABNORMAL HIGH (ref 26.0–34.0)
MCHC: 33.9 g/dL (ref 30.0–36.0)
MCV: 105.6 fL — ABNORMAL HIGH (ref 80.0–100.0)
Monocytes Absolute: 0.5 10*3/uL (ref 0.1–1.0)
Monocytes Relative: 12 %
Neutro Abs: 2.8 10*3/uL (ref 1.7–7.7)
Neutrophils Relative %: 74 %
Platelet Count: 247 10*3/uL (ref 150–400)
RBC: 3.05 MIL/uL — ABNORMAL LOW (ref 3.87–5.11)
RDW: 13 % (ref 11.5–15.5)
WBC Count: 3.8 10*3/uL — ABNORMAL LOW (ref 4.0–10.5)
nRBC: 0 % (ref 0.0–0.2)

## 2021-11-26 LAB — CMP (CANCER CENTER ONLY)
ALT: 11 U/L (ref 0–44)
AST: 13 U/L — ABNORMAL LOW (ref 15–41)
Albumin: 4.2 g/dL (ref 3.5–5.0)
Alkaline Phosphatase: 28 U/L — ABNORMAL LOW (ref 38–126)
Anion gap: 5 (ref 5–15)
BUN: 13 mg/dL (ref 8–23)
CO2: 27 mmol/L (ref 22–32)
Calcium: 9 mg/dL (ref 8.9–10.3)
Chloride: 109 mmol/L (ref 98–111)
Creatinine: 0.52 mg/dL (ref 0.44–1.00)
GFR, Estimated: >60 mL/min (ref >60)
Glucose, Bld: 100 mg/dL — ABNORMAL HIGH (ref 70–99)
Potassium: 3.9 mmol/L (ref 3.5–5.1)
Sodium: 141 mmol/L (ref 135–145)
Total Bilirubin: 0.5 mg/dL (ref 0.3–1.2)
Total Protein: 6.5 g/dL (ref 6.5–8.1)

## 2021-11-26 MED ORDER — SODIUM CHLORIDE 0.9 % IV SOLN: Freq: Once | INTRAVENOUS | Status: AC

## 2021-11-26 MED ORDER — SODIUM CHLORIDE 0.9% FLUSH
10.0000 mL | INTRAVENOUS | Status: DC | PRN
  Administered 2021-11-26: 10 mL

## 2021-11-26 MED ORDER — ONDANSETRON HCL 4 MG/2ML IJ SOLN
4.0000 mg | Freq: Once | INTRAMUSCULAR | Status: AC
  Administered 2021-11-26: 4 mg via INTRAVENOUS
  Filled 2021-11-26: qty 2

## 2021-11-26 MED ORDER — HEPARIN SOD (PORK) LOCK FLUSH 100 UNIT/ML IV SOLN
500.0000 [IU] | Freq: Once | INTRAVENOUS | Status: AC | PRN
  Administered 2021-11-26: 500 [IU]

## 2021-11-26 MED ORDER — ACETAMINOPHEN 500 MG PO TABS
1000.0000 mg | ORAL_TABLET | Freq: Once | ORAL | Status: AC
  Administered 2021-11-26: 1000 mg via ORAL
  Filled 2021-11-26: qty 2

## 2021-11-26 MED ORDER — DEXTROSE 5 % IV SOLN
56.0000 mg/m2 | Freq: Once | INTRAVENOUS | Status: AC
  Administered 2021-11-26: 80 mg via INTRAVENOUS
  Filled 2021-11-26: qty 30

## 2021-11-26 MED ORDER — SODIUM CHLORIDE 0.9% FLUSH
10.0000 mL | INTRAVENOUS | Status: AC | PRN
  Administered 2021-11-26: 10 mL

## 2021-11-26 MED ORDER — DEXAMETHASONE SODIUM PHOSPHATE 10 MG/ML IJ SOLN
6.0000 mg | Freq: Once | INTRAMUSCULAR | Status: AC
  Administered 2021-11-26: 6 mg via INTRAVENOUS
  Filled 2021-11-26: qty 1

## 2021-11-26 NOTE — Patient Instructions (Signed)
[facility]  Discharge Instructions: Thank you for choosing [facility] to provide your oncology and hematology care.   If you have a lab appointment with the Cancer Center, please go directly to the Cancer Center and check in at the registration area.   Wear comfortable clothing and clothing appropriate for easy access to any Portacath or PICC line.   We strive to give you quality time with your provider. You may need to reschedule your appointment if you arrive late (15 or more minutes).  Arriving late affects you and other patients whose appointments are after yours.  Also, if you miss three or more appointments without notifying the office, you may be dismissed from the clinic at the provider's discretion.      For prescription refill requests, have your pharmacy contact our office and allow 72 hours for refills to be completed.    Today you received the following chemotherapy and/or immunotherapy agents: Kyprolis    To help prevent nausea and vomiting after your treatment, we encourage you to take your nausea medication as directed.  BELOW ARE SYMPTOMS THAT SHOULD BE REPORTED IMMEDIATELY: . *FEVER GREATER THAN 100.4 F (38 C) OR HIGHER . *CHILLS OR SWEATING . *NAUSEA AND VOMITING THAT IS NOT CONTROLLED WITH YOUR NAUSEA MEDICATION . *UNUSUAL SHORTNESS OF BREATH . *UNUSUAL BRUISING OR BLEEDING . *URINARY PROBLEMS (pain or burning when urinating, or frequent urination) . *BOWEL PROBLEMS (unusual diarrhea, constipation, pain near the anus) . TENDERNESS IN MOUTH AND THROAT WITH OR WITHOUT PRESENCE OF ULCERS (sore throat, sores in mouth, or a toothache) . UNUSUAL RASH, SWELLING OR PAIN  . UNUSUAL VAGINAL DISCHARGE OR ITCHING   Items with * indicate a potential emergency and should be followed up as soon as possible or go to the Emergency Department if any problems should occur.  Please show the CHEMOTHERAPY ALERT CARD or IMMUNOTHERAPY ALERT  CARD at check-in to the Emergency Department and triage nurse.  Should you have questions after your visit or need to cancel or reschedule your appointment, please contact [facility]  Dept: [phone]  and follow the prompts.  Office hours are 8:00 a.m. to 4:30 p.m. Monday - Friday. Please note that voicemails left after 4:00 p.m. may not be returned until the following business day.  We are closed weekends and major holidays. You have access to a nurse at all times for urgent questions. Please call the main number to the clinic Dept: [phone] and follow the prompts.   For any non-urgent questions, you may also contact your provider using MyChart. We now offer e-Visits for anyone 18 and older to request care online for non-urgent symptoms. For details visit [email or web address].   Also download the MyChart app! Go to the app store, search "MyChart", open the app, select [place], and log in with your MyChart username and password.  Due to Covid, a mask is required upon entering the hospital/clinic. If you do not have a mask, one will be given to you upon arrival. For doctor visits, patients may have 1 support person aged 18 or older with them. For treatment visits, patients cannot have anyone with them due to current Covid guidelines and our immunocompromised population.   

## 2021-11-28 ENCOUNTER — Encounter: Payer: Self-pay | Admitting: Hematology

## 2021-11-28 DIAGNOSIS — R300 Dysuria: Secondary | ICD-10-CM | POA: Diagnosis not present

## 2021-11-28 DIAGNOSIS — R310 Gross hematuria: Secondary | ICD-10-CM | POA: Diagnosis not present

## 2021-11-28 MED ORDER — OXYCODONE-ACETAMINOPHEN 5-325 MG PO TABS: 1.0000 | ORAL_TABLET | ORAL | 0 refills | Status: DC | PRN

## 2021-12-01 ENCOUNTER — Ambulatory Visit (INDEPENDENT_AMBULATORY_CARE_PROVIDER_SITE_OTHER): Payer: Medicare HMO | Admitting: Pharmacist

## 2021-12-01 DIAGNOSIS — E785 Hyperlipidemia, unspecified: Secondary | ICD-10-CM

## 2021-12-01 DIAGNOSIS — R7303 Prediabetes: Secondary | ICD-10-CM

## 2021-12-01 DIAGNOSIS — C9002 Multiple myeloma in relapse: Secondary | ICD-10-CM

## 2021-12-01 DIAGNOSIS — I10 Essential (primary) hypertension: Secondary | ICD-10-CM

## 2021-12-01 NOTE — Patient Instructions (Addendum)
Visit Information   Goals Addressed             This Visit's Progress    Keep Pain Under Control-Cancer Treatment       Timeframe:  Long-Range Goal Priority:  High Start Date:  12/01/21                           Expected End Date:  05/31/22                     Follow Up Date 02/28/22    - call for prescription refill 3 days before needed - develop a personal pain management plan - plan exercise or activity when pain is best controlled - prioritize tasks for the day - use medicine only as doctor says    Why is this important?   Day-to-day life can be hard when you have pain.  Even a small change in emotion or a physical problem can make pain better or worse.  Coping with pain depends on how the mind and body reacts to pain.  Pain medicine is just one piece of the treatment puzzle.  There are many tools to help manage pain. A combination of them can be used to best meet your needs.     Notes:        Patient Care Plan: General Pharmacy (Adult)     Problem Identified: HTN, Osteoporosis, HLD, Pre-DM, Multiple Myeloma   Priority: High  Onset Date: 12/01/2021     Long-Range Goal: Patient-Specific Goal   Start Date: 12/01/2021  Expected End Date: 12/01/2022  This Visit's Progress: On track  Priority: High  Note:   Current Barriers:  Chemotherapy/cancer treatment  Pharmacist Clinical Goal(s):  Patient will contact provider office for questions/concerns as evidenced notation of same in electronic health record through collaboration with PharmD and provider.   Interventions: 1:1 collaboration with [name], MD regarding development and update of comprehensive plan of care as evidenced by provider attestation and co-signature Inter-disciplinary care team collaboration (see longitudinal plan of care) Comprehensive medication review performed; medication list updated in electronic medical record  Hypertension (BP goal <130/80) -Controlled -Current  treatment: Amlodipine 10mg  daily Appropriate, Effective, Safe, Accessible -Medications previously tried: none noted  -Current home readings: 132/77, 122/79, 125/78, 129/75, 130/79, 126/79 (best of 3 at a time) -Current dietary habits: eats a well balanced diet, no eating out -Current exercise habits: walks almost daily weather permitting with husband -Denies hypotensive/hypertensive symptoms -Educated on BP goals and benefits of medications for prevention of heart attack, stroke and kidney damage; Exercise goal of 150 minutes per week; Symptoms of hypotension and importance of maintaining adequate hydration; -Counseled to monitor BP at home a few times per week, document, and provide log at future appointments -Recommended to continue current medication No changes needed - new dose of 10mg  seems to be adequately controlling BP.  Hyperlipidemia: (LDL goal < 100) -Uncontrolled -Current treatment: None -Medications previously tried: none  -Current dietary patterns: see HTN -Current exercise habits: see HTn -Educated on Cholesterol goals;  Benefits of statin for ASCVD risk reduction; Importance of limiting foods high in cholesterol; -Recommended continue current management, she does not want to take statin medication.  Have not checked lipids since 2021.  Could consider recheck at next OV.  Pre-Diabetes (A1c goal <6.5%) -Controlled -Current medications: None -Medications previously tried: none  -Current home glucose readings fasting glucose: not checking post prandial glucose: not checking -Denies  hypoglycemic/hyperglycemic symptoms -Current exercise: walks almost daily with husbadn -Educated on A1c and blood sugar goals; Effect of steroid on blood glucose -Counseled to check feet daily and get yearly eye exams -Recommended continue routine screenings, she is cutting back on the amount of dexamethasone she is taking which should continue to help her A1c decrease.  Osteoporosis  (Goal Prevent fracture) -Controlled -Last DEXA Scan: 2014    T-Score lumbar spine: -2.3  -Patient is not a candidate for pharmacologic treatment -Current treatment  Vitamin D 5000 IU tabs daily Appropriate, Effective, Safe, Accessible -Medications previously tried: none noted  -Recommend weight-bearing and muscle strengthening exercises for building and maintaining bone density. -Recommended to continue current medication Hopes to get back to strength training as she is on maintenance from myeloma  Multiple Myeloma (Goal: Remission) -Controlled -Current treatment  Fentanyl 58mcg/HR Appropriate, Effective, Safe, Accessible Oxycodone 5-325mg  one half tablet 2 to 3 times per day Appropriate, Effective, Safe, Accessible Carfilzomib 80mg  Appropriate, Effective, Safe, Accessible -Medications previously tried: none noted -She reports pain is currently a 2 of 10.  She is not having to use much of the Percocet for breakthrough except for on a day where she may do more strenuous activity than normal.  Hopes to be in maintenance phase of chemo soon which she thinks would be two weeks out of the month every other week) on the same treatment she is currently on.  -Recommended to continue current medication Seems to be doing well overall with chemo and pain management.  Denies any issues with constipation a this time.  Patient Goals/Self-Care Activities Patient will:  - take medications as prescribed as evidenced by patient report and record review check blood pressure a few times per week, document, and provide at future appointments  Follow Up Plan: The care management team will reach out to the patient again over the next 365 days.        Ms. Gossen was given information about Chronic Care Management services today including:  CCM service includes personalized support from designated clinical staff supervised by her physician, including individualized plan of care and coordination with other  care providers 24/7 contact phone numbers for assistance for urgent and routine care needs. Standard insurance, coinsurance, copays and deductibles apply for chronic care management only during months in which we provide at least 20 minutes of these services. Most insurances cover these services at 100%, however patients may be responsible for any copay, coinsurance and/or deductible if applicable. This service may help you avoid the need for more expensive face-to-face services. Only one practitioner may furnish and bill the service in a calendar month. The patient may stop CCM services at any time (effective at the end of the month) by phone call to the office staff.  Patient agreed to services and verbal consent obtained.   The patient verbalized understanding of instructions, educational materials, and care plan provided today and agreed to receive a mailed copy of patient instructions, educational materials, and care plan.  Telephone follow up appointment with pharmacy team member scheduled for: 1 year  [name], [place], PharmD Clinical Pharmacist  [facility] [phone]

## 2021-12-02 DIAGNOSIS — E785 Hyperlipidemia, unspecified: Secondary | ICD-10-CM

## 2021-12-02 DIAGNOSIS — I10 Essential (primary) hypertension: Secondary | ICD-10-CM | POA: Diagnosis not present

## 2021-12-04 ENCOUNTER — Other Ambulatory Visit: Payer: Self-pay

## 2021-12-04 ENCOUNTER — Ambulatory Visit (HOSPITAL_COMMUNITY)
Admission: RE | Admit: 2021-12-04 | Discharge: 2021-12-04 | Disposition: A | Discharge status: Home / Self Care | Payer: Medicare HMO | Source: Ambulatory Visit | Attending: Hematology | Admitting: Hematology

## 2021-12-04 DIAGNOSIS — C9000 Multiple myeloma not having achieved remission: Secondary | ICD-10-CM | POA: Diagnosis not present

## 2021-12-04 DIAGNOSIS — C9002 Multiple myeloma in relapse: Secondary | ICD-10-CM | POA: Insufficient documentation

## 2021-12-04 LAB — GLUCOSE, CAPILLARY: Glucose-Capillary: 115 mg/dL — ABNORMAL HIGH (ref 70–99)

## 2021-12-04 MED ORDER — FLUDEOXYGLUCOSE F - 18 (FDG) INJECTION
5.2400 | Freq: Once | INTRAVENOUS | Status: AC | PRN
  Administered 2021-12-04: 5.24 via INTRAVENOUS

## 2021-12-09 ENCOUNTER — Other Ambulatory Visit: Payer: Self-pay

## 2021-12-09 DIAGNOSIS — C9002 Multiple myeloma in relapse: Secondary | ICD-10-CM

## 2021-12-10 ENCOUNTER — Inpatient Hospital Stay: Payer: Medicare HMO

## 2021-12-10 ENCOUNTER — Other Ambulatory Visit: Payer: Self-pay

## 2021-12-10 ENCOUNTER — Inpatient Hospital Stay: Payer: Medicare HMO | Attending: Hematology | Admitting: Hematology

## 2021-12-10 ENCOUNTER — Telehealth: Payer: Self-pay | Admitting: Hematology

## 2021-12-10 ENCOUNTER — Other Ambulatory Visit: Payer: Self-pay | Admitting: Hematology

## 2021-12-10 VITALS — BP 169/86 | HR 66 | Temp 98.3°F | Resp 16 | Wt 102.5 lb

## 2021-12-10 DIAGNOSIS — Z5111 Encounter for antineoplastic chemotherapy: Secondary | ICD-10-CM | POA: Diagnosis not present

## 2021-12-10 DIAGNOSIS — Z5112 Encounter for antineoplastic immunotherapy: Secondary | ICD-10-CM | POA: Insufficient documentation

## 2021-12-10 DIAGNOSIS — Z95828 Presence of other vascular implants and grafts: Secondary | ICD-10-CM

## 2021-12-10 DIAGNOSIS — C9002 Multiple myeloma in relapse: Secondary | ICD-10-CM

## 2021-12-10 DIAGNOSIS — Z7189 Other specified counseling: Secondary | ICD-10-CM

## 2021-12-10 LAB — CBC WITH DIFFERENTIAL (CANCER CENTER ONLY)
Abs Immature Granulocytes: 0.02 10*3/uL (ref 0.00–0.07)
Basophils Absolute: 0.1 10*3/uL (ref 0.0–0.1)
Basophils Relative: 1 %
Eosinophils Absolute: 0.1 10*3/uL (ref 0.0–0.5)
Eosinophils Relative: 2 %
HCT: 33.2 % — ABNORMAL LOW (ref 36.0–46.0)
Hemoglobin: 10.8 g/dL — ABNORMAL LOW (ref 12.0–15.0)
Immature Granulocytes: 0 %
Lymphocytes Relative: 6 %
Lymphs Abs: 0.4 10*3/uL — ABNORMAL LOW (ref 0.7–4.0)
MCH: 35.1 pg — ABNORMAL HIGH (ref 26.0–34.0)
MCHC: 32.5 g/dL (ref 30.0–36.0)
MCV: 107.8 fL — ABNORMAL HIGH (ref 80.0–100.0)
Monocytes Absolute: 0.6 10*3/uL (ref 0.1–1.0)
Monocytes Relative: 9 %
Neutro Abs: 5.4 10*3/uL (ref 1.7–7.7)
Neutrophils Relative %: 82 %
Platelet Count: 308 10*3/uL (ref 150–400)
RBC: 3.08 MIL/uL — ABNORMAL LOW (ref 3.87–5.11)
RDW: 13 % (ref 11.5–15.5)
WBC Count: 6.5 10*3/uL (ref 4.0–10.5)
nRBC: 0 % (ref 0.0–0.2)

## 2021-12-10 LAB — CMP (CANCER CENTER ONLY)
ALT: 11 U/L (ref 0–44)
AST: 13 U/L — ABNORMAL LOW (ref 15–41)
Albumin: 4.3 g/dL (ref 3.5–5.0)
Alkaline Phosphatase: 28 U/L — ABNORMAL LOW (ref 38–126)
Anion gap: 4 — ABNORMAL LOW (ref 5–15)
BUN: 14 mg/dL (ref 8–23)
CO2: 28 mmol/L (ref 22–32)
Calcium: 9.1 mg/dL (ref 8.9–10.3)
Chloride: 107 mmol/L (ref 98–111)
Creatinine: 0.51 mg/dL (ref 0.44–1.00)
GFR, Estimated: >60 mL/min (ref >60)
Glucose, Bld: 109 mg/dL — ABNORMAL HIGH (ref 70–99)
Potassium: 4.4 mmol/L (ref 3.5–5.1)
Sodium: 139 mmol/L (ref 135–145)
Total Bilirubin: 0.5 mg/dL (ref 0.3–1.2)
Total Protein: 6.3 g/dL — ABNORMAL LOW (ref 6.5–8.1)

## 2021-12-10 MED ORDER — SODIUM CHLORIDE 0.9 % IV SOLN: Freq: Once | INTRAVENOUS | Status: AC

## 2021-12-10 MED ORDER — HEPARIN SOD (PORK) LOCK FLUSH 100 UNIT/ML IV SOLN: 500.0000 [IU] | Freq: Once | INTRAVENOUS | Status: DC | PRN

## 2021-12-10 MED ORDER — DEXAMETHASONE SODIUM PHOSPHATE 10 MG/ML IJ SOLN
6.0000 mg | Freq: Once | INTRAMUSCULAR | Status: AC
  Administered 2021-12-10: 6 mg via INTRAVENOUS
  Filled 2021-12-10: qty 1

## 2021-12-10 MED ORDER — SODIUM CHLORIDE 0.9% FLUSH: 10.0000 mL | INTRAVENOUS | Status: DC | PRN

## 2021-12-10 MED ORDER — DEXTROSE 5 % IV SOLN
56.0000 mg/m2 | Freq: Once | INTRAVENOUS | Status: AC
  Administered 2021-12-10: 80 mg via INTRAVENOUS
  Filled 2021-12-10: qty 30

## 2021-12-10 MED ORDER — ONDANSETRON HCL 4 MG/2ML IJ SOLN
4.0000 mg | Freq: Once | INTRAMUSCULAR | Status: AC
  Administered 2021-12-10: 4 mg via INTRAVENOUS
  Filled 2021-12-10: qty 2

## 2021-12-10 MED ORDER — ACETAMINOPHEN 500 MG PO TABS
1000.0000 mg | ORAL_TABLET | Freq: Once | ORAL | Status: AC
  Administered 2021-12-10: 1000 mg via ORAL
  Filled 2021-12-10: qty 2

## 2021-12-10 MED ORDER — SODIUM CHLORIDE 0.9% FLUSH
10.0000 mL | INTRAVENOUS | Status: AC | PRN
  Administered 2021-12-10: 10 mL

## 2021-12-10 NOTE — Patient Instructions (Signed)
[facility]  Discharge Instructions: Thank you for choosing [facility] to provide your oncology and hematology care.   If you have a lab appointment with the Cancer Center, please go directly to the Cancer Center and check in at the registration area.   Wear comfortable clothing and clothing appropriate for easy access to any Portacath or PICC line.   We strive to give you quality time with your provider. You may need to reschedule your appointment if you arrive late (15 or more minutes).  Arriving late affects you and other patients whose appointments are after yours.  Also, if you miss three or more appointments without notifying the office, you may be dismissed from the clinic at the provider's discretion.      For prescription refill requests, have your pharmacy contact our office and allow 72 hours for refills to be completed.    Today you received the following chemotherapy and/or immunotherapy agents kyprolis      To help prevent nausea and vomiting after your treatment, we encourage you to take your nausea medication as directed.  BELOW ARE SYMPTOMS THAT SHOULD BE REPORTED IMMEDIATELY: . *FEVER GREATER THAN 100.4 F (38 C) OR HIGHER . *CHILLS OR SWEATING . *NAUSEA AND VOMITING THAT IS NOT CONTROLLED WITH YOUR NAUSEA MEDICATION . *UNUSUAL SHORTNESS OF BREATH . *UNUSUAL BRUISING OR BLEEDING . *URINARY PROBLEMS (pain or burning when urinating, or frequent urination) . *BOWEL PROBLEMS (unusual diarrhea, constipation, pain near the anus) . TENDERNESS IN MOUTH AND THROAT WITH OR WITHOUT PRESENCE OF ULCERS (sore throat, sores in mouth, or a toothache) . UNUSUAL RASH, SWELLING OR PAIN  . UNUSUAL VAGINAL DISCHARGE OR ITCHING   Items with * indicate a potential emergency and should be followed up as soon as possible or go to the Emergency Department if any problems should occur.  Please show the CHEMOTHERAPY ALERT CARD or IMMUNOTHERAPY ALERT  CARD at check-in to the Emergency Department and triage nurse.  Should you have questions after your visit or need to cancel or reschedule your appointment, please contact [facility]  Dept: [phone]  and follow the prompts.  Office hours are 8:00 a.m. to 4:30 p.m. Monday - Friday. Please note that voicemails left after 4:00 p.m. may not be returned until the following business day.  We are closed weekends and major holidays. You have access to a nurse at all times for urgent questions. Please call the main number to the clinic Dept: [phone] and follow the prompts.   For any non-urgent questions, you may also contact your provider using MyChart. We now offer e-Visits for anyone 18 and older to request care online for non-urgent symptoms. For details visit [email or web address].   Also download the MyChart app! Go to the app store, search "MyChart", open the app, select [place], and log in with your MyChart username and password.  Due to Covid, a mask is required upon entering the hospital/clinic. If you do not have a mask, one will be given to you upon arrival. For doctor visits, patients may have 1 support person aged 18 or older with them. For treatment visits, patients cannot have anyone with them due to current Covid guidelines and our immunocompromised population.   

## 2021-12-10 NOTE — Telephone Encounter (Signed)
Scheduled follow-up appointments per 3/8 los. Patient is aware. ?

## 2021-12-11 LAB — KAPPA/LAMBDA LIGHT CHAINS
Kappa free light chain: 7.1 mg/L (ref 3.3–19.4)
Kappa, lambda light chain ratio: 0.1 — ABNORMAL LOW (ref 0.26–1.65)
Lambda free light chains: 69.2 mg/L — ABNORMAL HIGH (ref 5.7–26.3)

## 2021-12-12 LAB — MULTIPLE MYELOMA PANEL, SERUM
Albumin SerPl Elph-Mcnc: 3.9 g/dL (ref 2.9–4.4)
Albumin/Glob SerPl: 1.7 (ref 0.7–1.7)
Alpha 1: 0.2 g/dL (ref 0.0–0.4)
Alpha2 Glob SerPl Elph-Mcnc: 0.6 g/dL (ref 0.4–1.0)
B-Globulin SerPl Elph-Mcnc: 1.3 g/dL (ref 0.7–1.3)
Gamma Glob SerPl Elph-Mcnc: 0.2 g/dL — ABNORMAL LOW (ref 0.4–1.8)
Globulin, Total: 2.3 g/dL (ref 2.2–3.9)
IgA: 13 mg/dL — ABNORMAL LOW (ref 64–422)
IgG (Immunoglobin G), Serum: 795 mg/dL (ref 586–1602)
IgM (Immunoglobulin M), Srm: 10 mg/dL — ABNORMAL LOW (ref 26–217)
M Protein SerPl Elph-Mcnc: 0.5 g/dL — ABNORMAL HIGH
Total Protein ELP: 6.2 g/dL (ref 6.0–8.5)

## 2021-12-15 NOTE — Progress Notes (Incomplete)
Phone [phone]   Subjective:  Patient presents today for their annual physical. Chief complaint-noted.   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   Prediabetes 06/18/2020   Left thyroid nodule 06/18/2020   Multiple myeloma not having achieved remission ([place]) 12/12/2018   Counseling regarding advance care planning and goals of care 12/12/2018   Pain in thoracic spine 11/03/2018   Primary open angle glaucoma (POAG) of right eye, moderate stage 10/11/2017   Combined forms of age-related cataract of left eye 04/28/2017   History of adenomatous polyp of colon 07/23/2015   Hyperglycemia 03/29/2015   Raynaud phenomenon 10/19/2014   Syncope 10/19/2014   Hyperlipidemia 10/18/2014   Glaucoma 01/05/2011   Essential hypertension 03/11/2007   Osteoporosis 03/11/2007   Past Surgical History:  Procedure Laterality Date   BONE MARROW BIOPSY     multiple   BREAST EXCISIONAL BIOPSY Right 2000   BREAST LUMPECTOMY  1990   benign   CATARACT EXTRACTION Bilateral 2018   DILATION AND CURETTAGE OF UTERUS     bleeding at menopause. No uterine cancer   IR IMAGING GUIDED PORT INSERTION  05/16/2021   IR RADIOLOGIST EVAL & MGMT  12/13/2018   THYROIDECTOMY N/A 08/02/2020   Procedure: TOTAL THYROIDECTOMY;  Surgeon: [name], MD;  Location: [facility];  Service: General;  Laterality: N/A;   TONSILLECTOMY     age 61    Family History  Problem Relation Age of Onset   Heart disease Mother        CHF  mother died 2   Arthritis Mother    Glaucoma Mother        sister as well   Alcohol abuse Father    Suicidality Father    Heart disease Sister        aortic valve replacement   Lung cancer Sister        smoker   Hyperlipidemia Brother    Hypertension Brother    COPD Brother    Colon polyps Brother    Arthritis Sister    Hypertension Sister    Glaucoma Sister    Hashimoto's thyroiditis Sister    Colon polyps Sister    Hypertension Son    Stroke Maternal Grandmother    Cystic fibrosis Niece    Colon cancer Neg Hx     Medications- reviewed and updated Current Outpatient Medications  Medication Sig Dispense Refill   acyclovir (ZOVIRAX) 400 MG tablet Take 1 tablet (400 mg total) by mouth 2 (two) times daily. 60 tablet 3   ALPHAGAN P 0.1 % SOLN Place 1 drop into both eyes in the morning, at noon, and at bedtime.      amLODipine (NORVASC) 10 MG tablet Take 1 tablet (10 mg total) by mouth daily. 90 tablet 3   aspirin EC 81 MG tablet Take 81 mg by mouth daily.     bimatoprost (LUMIGAN) 0.03 % ophthalmic solution Place 1 drop into both eyes at  bedtime.      Cholecalciferol (VITAMIN D3) 125 MCG (5000 UT) TABS Take 5,000 Units by mouth daily.     Co-Enzyme Q-10 100 MG CAPS Take 100 mg by mouth daily.     dexamethasone (DECADRON) 4 MG tablet Take 5 tablets ($RemoveBe'20mg'HbNokDGma$ ) on day 22 of each cycle. Repeat every 28 days.  Take with breakfast. (Patient not taking: Reported on 12/01/2021) 12 tablet 3   dorzolamide-timolol (COSOPT) 22.3-6.8 MG/ML ophthalmic solution Place 1 drop into both eyes 2 (two) times daily.   11   fentaNYL (DURAGESIC) 12 MCG/HR Place 1 patch onto the skin every 3 (three) days. 10 patch 0   levothyroxine (SYNTHROID) 88 MCG tablet Take 1 tablet (88 mcg total) by mouth daily before breakfast. 45 tablet 5   lidocaine-prilocaine (EMLA) cream Apply 1 application topically as needed. Apply prior to port access. 30 g 0   magnesium chloride (SLOW-MAG) 64 MG TBEC SR tablet Take by mouth.      Multiple Vitamins-Minerals (MULTIVITAMIN WITH MINERALS) tablet Take 1 tablet by mouth daily. Centrum Silver 50 +     Omega-3 1000 MG CAPS Take 1,000 mg by mouth daily.      ondansetron (ZOFRAN) 8 MG tablet Take 8 mg by mouth 30 to 60 min prior to Cytoxan administration then take 8 mg twice daily as needed for nausea and vomiting. (Patient not taking: Reported on 12/01/2021) 30 tablet 1   oxyCODONE-acetaminophen (PERCOCET) 5-325 MG tablet Take 1-2 tablets by mouth every 4 (four) hours as needed for moderate pain or severe pain. 90 tablet 0   PFIZER COVID-19 VAC BIVALENT injection      polyethylene glycol (MIRALAX) packet Take 17 g by mouth daily. 30 each 1   prochlorperazine (COMPAZINE) 10 MG tablet Take 1 tablet (10 mg total) by mouth every 6 (six) hours as needed (Nausea or vomiting). (Patient not taking: Reported on 10/28/2021) 30 tablet 1   Simethicone (GAS-X PO) Take 1 tablet by mouth as needed.     vitamin C (ASCORBIC ACID) 500 MG tablet Take 500 mg by mouth 2 (two) times a week.      No current facility-administered medications for this visit.    Allergies-reviewed and updated Allergies  Allergen Reactions   Ace Inhibitors Cough   Benadryl [Diphenhydramine] Other (See Comments)    Heart races   Diamox [Acetazolamide]     Hypotensive event at ophthalmologist   Sulfamethoxazole     REACTION: rash   Lenalidomide Rash   Penicillins Rash    REACTION: rash Did it involve swelling of the face/tongue/throat, SOB, or low BP? Yes Did it involve sudden or severe rash/hives, skin peeling, or any reaction on the inside of your mouth or nose? No Did you need to seek medical attention at a hospital or doctor's office? No When did it last happen?      more than 10 years ago If all above answers are NO, may proceed with cephalosporin use.     Social History   Social History Narrative   Married. Lives with husband (patient of Dr. Yong Channel). 1 son. No grandkids. 1 granddog.       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 Gen: NAD, resting comfortably HEENT: Mucous membranes are moist. Oropharynx normal Neck: no thyromegaly CV: RRR  no murmurs rubs or gallops Lungs: CTAB no crackles, wheeze, rhonchi Abdomen: soft/nontender/nondistended/normal bowel sounds. No rebound or guarding.  Ext: no edema Skin: warm, dry Neuro: grossly normal, moves all extremities, PERRLA***   Assessment and Plan   81 y.o. female presenting for annual physical.  Health Maintenance counseling: 1. Anticipatory guidance: Patient counseled regarding regular dental exams -yearly at this point- trying to get scheduled- normally ***q6 months, eye exams -- regular with glaucoma,***,  avoiding smoking and second hand smoke*** , limiting alcohol to 1 beverage per day- no alcohol at present .  *** .  No illicit drugs - *** 2. Risk factor reduction:  Advised patient of need for regular exercise and diet rich and fruits and vegetables to reduce risk of heart attack and stroke.  Exercise- walking most days still or pushing lawnmower or vacuuming***.  Diet- Slight weight gain from last physical which is welcome- eating reasonably healthy -she may reach out about PT referral if needed to help with exercises ***.  Wt Readings from Last 3 Encounters:  12/10/21 102 lb 8 oz (46.5 kg)  11/26/21 105 lb 8 oz (47.9 kg)  11/19/21 103 lb (46.7 kg)   3. Immunizations/screenings/ancillary studies DISCUSSED:  -COVID booster vaccination #6- *** -TDAP vaccination - *** Immunization History  Administered Date(s) Administered   Fluad Quad(high Dose 65+) 06/13/2019, 07/03/2020, 06/17/2021   Influenza Split 07/19/2012   Influenza Whole 07/17/2008, 06/28/2009, 06/25/2010   Influenza, High Dose Seasonal PF 07/22/2016, 06/24/2017, 06/23/2018    Influenza,inj,Quad PF,6+ Mos 06/27/2013, 06/18/2014   Influenza-Unspecified 07/03/2015   PFIZER(Purple Top)SARS-COV-2 Vaccination 11/09/2019, 12/04/2019, 06/03/2020, 01/09/2021, 07/04/2021   PNEUMOCOCCAL CONJUGATE-20 05/21/2021   Pneumococcal Conjugate-13 03/29/2015   Pneumococcal Polysaccharide-23 04/04/2006   Td 02/10/2010   Zoster Recombinat (Shingrix) 03/27/2020, 09/06/2020   Zoster, Live 09/26/2008   Health Maintenance Due  Topic Date Due   TETANUS/TDAP  02/11/2020   COVID-19 Vaccine (6 - Booster for [place] series) 08/29/2021   4. Cervical cancer screening-past age based screening recommendations *** 5. Breast cancer screening-  breast exam-prefer self exams *** and mammogram-she is considering still *** 6. Colon cancer screening - 07/16/15 with no repeats-due to multiple myeloma we have opted to hold off on repeat previously- some thinner stools and some RLQ discomfort at times and has visit with Dr. Fuller Plan*** 7. Skin cancer screening-saw Dr. Delman Cheadle- lesion removed and was benign thankfully. ***advised regular sunscreen use. Denies worrisome, changing, or new skin lesions.  8. Birth control/STD check-monogamous/postmenopausal *** 9. 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 positive margin- will ikely need RAI per Dr. Harlow Asa- plan back to Dr. Cruzita Lederer   #Concern for UTI S: Patients symptoms started 10/22/21. Noted very little urine come out and very painful. Noted a lot of frequency trough night and low volume. Had never had a UTI. Some more fatigue but not unusual after chemo.  Complains of dysuria: yes; polyuria: yes; nocturia: more than normal; urgency: new.  Symptoms are slightly improving with increasing fluids.  ROS- no  fever, chills, nausea, vomiting, flank pain. No blood in urine.  A/P: ***   #Multiple myeloma-doing well on maintenance medication. m protein up slightly to 0.4 from 0.3- has upcomign visit with Dr. Irene Limbo   #Chronic back pain- on  Half tablet  3 x a dayoxycodone-acetaminophenThrough oncology-cancer-related compression fractures and resulted chronic back pain   #Thyroid cancer papillary- thyroidectomy with central [place] surgery-Dr. Harlow Asa.  On levothyroxine 75 mcg-update TSH with labs.  Stable in January.  Continues follow-up with Dr. Cruzita Lederer- had to reduce dose due to some diarrhea.  Lab Results  Component Value Date   TSH 2.74 08/11/2021    A/P:***   #Macrocytosis- b12 has been normal - discussed folate rbc and b12 check but could just be related to multiple myeloma meds- she prefers to hold off on repeat for now   #hypertension with whitecoat hypertension S: medication: Amlodipine 5Mg  at bedtime Home readings #s: *** BP Readings from Last 3 Encounters:  12/10/21 (!) 169/86  11/26/21 (!) 173/86  11/19/21 (!) 178/86  A/P: ***   #hyperlipidemia S: Medication:None Lab Results  Component Value Date   CHOL 220 (H) 01/26/2020   HDL 68 01/26/2020   LDLCALC 132 (H) 01/26/2020   TRIG 97 01/26/2020   CHOLHDL 3.2 01/26/2020   A/P: ***   # Hyperglycemia/insulin resistance/prediabetes S:  Medication: none. See above on walking/exercise Exercise and diet- *** Lab Results  Component Value Date   HGBA1C 5.9 (A) 01/13/2021   HGBA1C 5.9 (A) 06/18/2020   HGBA1C 5.8 (H) 01/26/2020    A/P: ***  Recommended awv- she wanted to hold off   Recommended follow up: No follow-ups on file. Future Appointments  Date Time Provider [place]  12/17/2021  8:30 AM [facility] INFUSION [facility] None  12/17/2021  9:30 AM [facility] INFUSION [facility] None  12/24/2021  8:30 AM [facility] [place] FLUSH [facility] None  12/24/2021  9:30 AM [facility] INFUSION [facility] None  12/29/2021  9:40  AM [name], MD [facility] None  01/07/2022  9:30 AM [facility] [place] FLUSH [facility] None  01/07/2022 10:00 AM Dede Query T, PA-C [facility] None  01/07/2022 10:45 AM [facility] INFUSION [facility] None  01/14/2022  9:00 AM [facility] [place] FLUSH [facility] None  01/14/2022 10:00 AM [facility] INFUSION [facility] None  01/21/2022  8:30 AM [facility] [place] FLUSH [facility] None  01/21/2022  9:30 AM [facility] INFUSION [facility] None  02/02/2022  9:20 AM [name], MD [facility]  02/04/2022  8:30 AM [facility] [place] FLUSH [facility] None  02/04/2022  9:00 AM [name], MD [facility] None  02/04/2022 10:00 AM [facility] INFUSION [facility] None  02/11/2022  8:30 AM [facility] FLUSH [facility] None  02/11/2022  9:30 AM [facility] INFUSION [facility] None  02/18/2022  8:30 AM [facility] [place] FLUSH [facility] None  02/18/2022  9:30 AM [facility] INFUSION [facility] None  03/04/2022  9:30 AM [facility] INFUSION [facility] None  04/29/2022  9:30 AM [facility] INFUSION [facility] None  11/12/2022  8:45 AM [facility] HEALTH COACH [facility]  12/07/2022  3:45 PM [facility] CCM PHARMACIST [facility]    No chief complaint on file.  Lab/Order associations:*** fasting No diagnosis found.  No orders of the defined types were placed in this encounter.   Return precautions advised.  Burnett Corrente

## 2021-12-17 ENCOUNTER — Encounter: Payer: Self-pay | Admitting: Hematology

## 2021-12-17 ENCOUNTER — Inpatient Hospital Stay: Payer: Medicare HMO

## 2021-12-17 ENCOUNTER — Other Ambulatory Visit: Payer: Self-pay

## 2021-12-17 VITALS — BP 160/77 | HR 70 | Temp 98.5°F | Resp 18 | Wt 103.2 lb

## 2021-12-17 DIAGNOSIS — Z5112 Encounter for antineoplastic immunotherapy: Secondary | ICD-10-CM | POA: Diagnosis not present

## 2021-12-17 DIAGNOSIS — C9002 Multiple myeloma in relapse: Secondary | ICD-10-CM

## 2021-12-17 DIAGNOSIS — Z7189 Other specified counseling: Secondary | ICD-10-CM

## 2021-12-17 LAB — CMP (CANCER CENTER ONLY)
ALT: 11 U/L (ref 0–44)
AST: 13 U/L — ABNORMAL LOW (ref 15–41)
Albumin: 4.2 g/dL (ref 3.5–5.0)
Alkaline Phosphatase: 26 U/L — ABNORMAL LOW (ref 38–126)
Anion gap: 5 (ref 5–15)
BUN: 12 mg/dL (ref 8–23)
CO2: 28 mmol/L (ref 22–32)
Calcium: 9.2 mg/dL (ref 8.9–10.3)
Chloride: 106 mmol/L (ref 98–111)
Creatinine: 0.53 mg/dL (ref 0.44–1.00)
GFR, Estimated: >60 mL/min (ref >60)
Glucose, Bld: 114 mg/dL — ABNORMAL HIGH (ref 70–99)
Potassium: 3.9 mmol/L (ref 3.5–5.1)
Sodium: 139 mmol/L (ref 135–145)
Total Bilirubin: 0.6 mg/dL (ref 0.3–1.2)
Total Protein: 6.6 g/dL (ref 6.5–8.1)

## 2021-12-17 LAB — CBC WITH DIFFERENTIAL (CANCER CENTER ONLY)
Abs Immature Granulocytes: 0.02 10*3/uL (ref 0.00–0.07)
Basophils Absolute: 0.1 10*3/uL (ref 0.0–0.1)
Basophils Relative: 2 %
Eosinophils Absolute: 0.1 10*3/uL (ref 0.0–0.5)
Eosinophils Relative: 3 %
HCT: 31.8 % — ABNORMAL LOW (ref 36.0–46.0)
Hemoglobin: 10.8 g/dL — ABNORMAL LOW (ref 12.0–15.0)
Immature Granulocytes: 1 %
Lymphocytes Relative: 8 %
Lymphs Abs: 0.2 10*3/uL — ABNORMAL LOW (ref 0.7–4.0)
MCH: 35.9 pg — ABNORMAL HIGH (ref 26.0–34.0)
MCHC: 34 g/dL (ref 30.0–36.0)
MCV: 105.6 fL — ABNORMAL HIGH (ref 80.0–100.0)
Monocytes Absolute: 0.7 10*3/uL (ref 0.1–1.0)
Monocytes Relative: 24 %
Neutro Abs: 1.7 10*3/uL (ref 1.7–7.7)
Neutrophils Relative %: 62 %
Platelet Count: 143 10*3/uL — ABNORMAL LOW (ref 150–400)
RBC: 3.01 MIL/uL — ABNORMAL LOW (ref 3.87–5.11)
RDW: 12.8 % (ref 11.5–15.5)
WBC Count: 2.8 10*3/uL — ABNORMAL LOW (ref 4.0–10.5)
nRBC: 0 % (ref 0.0–0.2)

## 2021-12-17 MED ORDER — ACETAMINOPHEN 500 MG PO TABS
1000.0000 mg | ORAL_TABLET | Freq: Once | ORAL | Status: AC
  Administered 2021-12-17: 1000 mg via ORAL
  Filled 2021-12-17: qty 2

## 2021-12-17 MED ORDER — SODIUM CHLORIDE 0.9 % IV SOLN: Freq: Once | INTRAVENOUS | Status: AC

## 2021-12-17 MED ORDER — SODIUM CHLORIDE 0.9% FLUSH
10.0000 mL | INTRAVENOUS | Status: DC | PRN
  Administered 2021-12-17: 10 mL

## 2021-12-17 MED ORDER — DEXTROSE 5 % IV SOLN
56.0000 mg/m2 | Freq: Once | INTRAVENOUS | Status: AC
  Administered 2021-12-17: 80 mg via INTRAVENOUS
  Filled 2021-12-17: qty 30

## 2021-12-17 MED ORDER — ONDANSETRON HCL 4 MG/2ML IJ SOLN
4.0000 mg | Freq: Once | INTRAMUSCULAR | Status: AC
  Administered 2021-12-17: 4 mg via INTRAVENOUS
  Filled 2021-12-17: qty 2

## 2021-12-17 MED ORDER — HEPARIN SOD (PORK) LOCK FLUSH 100 UNIT/ML IV SOLN
500.0000 [IU] | Freq: Once | INTRAVENOUS | Status: AC | PRN
  Administered 2021-12-17: 500 [IU]

## 2021-12-17 MED ORDER — DEXAMETHASONE SODIUM PHOSPHATE 10 MG/ML IJ SOLN
4.0000 mg | Freq: Once | INTRAMUSCULAR | Status: AC
  Administered 2021-12-17: 4 mg via INTRAVENOUS
  Filled 2021-12-17: qty 1

## 2021-12-17 NOTE — Patient Instructions (Signed)
[facility]  Discharge Instructions: ?Thank you for choosing [facility] to provide your oncology and hematology care.  ? ?If you have a lab appointment with the [facility], please go directly to the [facility] and check in at the registration area. ?  ?Wear comfortable clothing and clothing appropriate for easy access to any Portacath or PICC line.  ? ?We strive to give you quality time with your provider. You may need to reschedule your appointment if you arrive late (15 or more minutes).  Arriving late affects you and other patients whose appointments are after yours.  Also, if you miss three or more appointments without notifying the office, you may be dismissed from the clinic at the provider?s discretion.    ?  ?For prescription refill requests, have your pharmacy contact our office and allow 72 hours for refills to be completed.   ? ?Today you received the following chemotherapy and/or immunotherapy agent: Kyprolis    ?  ?To help prevent nausea and vomiting after your treatment, we encourage you to take your nausea medication as directed. ? ?BELOW ARE SYMPTOMS THAT SHOULD BE REPORTED IMMEDIATELY: ?*FEVER GREATER THAN 100.4 F (38 ?C) OR HIGHER ?*CHILLS OR SWEATING ?*NAUSEA AND VOMITING THAT IS NOT CONTROLLED WITH YOUR NAUSEA MEDICATION ?*UNUSUAL SHORTNESS OF BREATH ?*UNUSUAL BRUISING OR BLEEDING ?*URINARY PROBLEMS (pain or burning when urinating, or frequent urination) ?*BOWEL PROBLEMS (unusual diarrhea, constipation, pain near the anus) ?TENDERNESS IN MOUTH AND THROAT WITH OR WITHOUT PRESENCE OF ULCERS (sore throat, sores in mouth, or a toothache) ?UNUSUAL RASH, SWELLING OR PAIN  ?UNUSUAL VAGINAL DISCHARGE OR ITCHING  ? ?Items with * indicate a potential emergency and should be followed up as soon as possible or go to the Emergency Department if any problems should occur. ? ?Please show the CHEMOTHERAPY ALERT CARD or IMMUNOTHERAPY ALERT CARD at check-in to  the Emergency Department and triage nurse. ? ?Should you have questions after your visit or need to cancel or reschedule your appointment, please contact [facility]  Dept: [phone]  and follow the prompts.  Office hours are 8:00 a.m. to 4:30 p.m. Monday - Friday. Please note that voicemails left after 4:00 p.m. may not be returned until the following business day.  We are closed weekends and major holidays. You have access to a nurse at all times for urgent questions. Please call the main number to the clinic Dept: [phone] and follow the prompts. ? ? ?For any non-urgent questions, you may also contact your provider using MyChart. We now offer e-Visits for anyone 62 and older to request care online for non-urgent symptoms. For details visit [email or web address]. ?  ?Also download the MyChart app! Go to the app store, search "MyChart", open the app, select , and log in with your MyChart username and password. ? ?Due to Covid, a mask is required upon entering the hospital/clinic. If you do not have a mask, one will be given to you upon arrival. For doctor visits, patients may have 1 support person aged 71 or older with them. For treatment visits, patients cannot have anyone with them due to current Covid guidelines and our immunocompromised population.  ? ?

## 2021-12-17 NOTE — Patient Instructions (Signed)

## 2021-12-17 NOTE — Progress Notes (Signed)
?[place]   ?[phone] [phone]   ?Clinic Follow up Note  ? ?Patient Care Team: ?[name], MD as PCP - General (Family Medicine) ?[name], MD as Consulting Physician (Hematology) ?Rondel Oh, MD as Referring Physician (Ophthalmology) ?Reola Calkins, MD as Consulting Physician (Hematology and Oncology) ?[name], MD as Consulting Physician (Internal Medicine) ?[name], MD as Consulting Physician (General Surgery) ?[name], [facility] as Pharmacist (Pharmacist) ? ?DOS .Marland Kitchen03/05/2022 ? ? ?CHIEF COMPLAINT:  ?Follow-up for continued evaluation and management of multiple myeloma and neck cycle of carfilzomib treatment ? ? ? ?SUMMARY OF ONCOLOGIC HISTORY: ?Oncology History  ?Multiple myeloma not having achieved remission ([place])  ?12/12/2018 Initial Diagnosis  ? Multiple myeloma not having achieved remission [facility]) ?  ?12/20/2018 - 10/04/2019 Chemotherapy  ? The patient had dexamethasone (DECADRON) tablet 20 mg, 20 mg (100 % of original dose 20 mg), Oral, Once, 14 of 14 cycles ?Dose modification: 20 mg (original dose 20 mg, Cycle 1), 10 mg (original dose 20 mg, Cycle 14) ?Administration: 20 mg (12/20/2018), 20 mg (12/27/2018), 20 mg (01/10/2019), 20 mg (01/17/2019), 20 mg (01/31/2019), 20 mg (02/07/2019), 20 mg (03/14/2019), 20 mg (03/21/2019), 20 mg (02/21/2019), 20 mg (02/28/2019), 20 mg (04/04/2019), 20 mg (04/11/2019), 20 mg (04/25/2019), 20 mg (05/02/2019), 20 mg (05/16/2019), 20 mg (05/23/2019), 20 mg (06/06/2019), 20 mg (06/13/2019), 20 mg (06/27/2019), 20 mg (07/04/2019), 20 mg (07/18/2019), 20 mg (07/25/2019), 20 mg (08/08/2019), 20 mg (08/15/2019), 20 mg (08/29/2019), 20 mg (09/05/2019), 10 mg (09/19/2019) ?lenalidomide (REVLIMID) 15 MG capsule, 1 of 1 cycle, Start date: 01/18/2019, End date: 02/21/2019 ?bortezomib SQ (VELCADE) chemo injection 2 mg, 1.3 mg/m2 = 2 mg, Subcutaneous,  Once, 14 of 14 cycles ?Administration: 2 mg (12/20/2018), 2 mg  (12/27/2018), 2 mg (12/23/2018), 2 mg (12/30/2018), 2 mg (01/10/2019), 2 mg (01/17/2019), 2 mg (01/13/2019), 2 mg (01/20/2019), 2 mg (01/31/2019), 2 mg (02/07/2019), 2 mg (02/03/2019), 2 mg (02/10/2019), 2 mg (03/14/2019), 2 mg (03/17/2019), 2 mg (03/21/2019), 2 mg (03/24/2019), 2 mg (02/21/2019), 2 mg (02/24/2019), 2 mg (02/28/2019), 2 mg (03/03/2019), 2 mg (04/04/2019), 2 mg (04/06/2019), 2 mg (04/11/2019), 2 mg (04/14/2019), 2 mg (04/25/2019), 2 mg (04/28/2019), 2 mg (05/02/2019), 2 mg (05/05/2019), 2 mg (05/16/2019), 2 mg (05/19/2019), 2 mg (05/23/2019), 2 mg (05/26/2019), 2 mg (06/06/2019), 2 mg (06/09/2019), 2 mg (06/13/2019), 2 mg (06/16/2019), 2 mg (06/27/2019), 2 mg (06/30/2019), 2 mg (07/04/2019), 2 mg (07/07/2019), 2 mg (07/18/2019), 2 mg (07/21/2019), 2 mg (07/25/2019), 2 mg (07/28/2019), 2 mg (08/08/2019), 2 mg (08/11/2019), 2 mg (08/15/2019), 2 mg (08/18/2019), 2 mg (08/29/2019), 2 mg (09/01/2019), 2 mg (09/05/2019), 2 mg (09/08/2019), 2 mg (09/19/2019), 2 mg (10/04/2019) ? ? for chemotherapy treatment.  ? ?  ?Multiple myeloma in relapse [facility])  ?04/16/2021 Initial Diagnosis  ? Multiple myeloma in relapse [facility]) ?  ?04/30/2021 -  Chemotherapy  ? Patient is on Treatment Plan : MYELOMA RELAPSED/REFRACTORY KCd q28d  ?   ? ? ?CURRENT THERAPY: Kyprolis and dexa started on 04/30/2021 ? ?INTERVAL HISTORY: ? ?Ms Lacombe is here for continued evaluation and management of her multiple myeloma and next cycle of carfilzomib. ?She notes no acute new symptoms since her last clinic visit. ?No new focal bone pains. ?No fevers no chills no night sweats no infection issues. ?We discussed different treatment approaches and she would prefer to continue her current carfilzomib regimen to the progression with use of minimal dose of steroids. ?Labs done today were reviewed with her in  detail. ? ? ?ROS ?10 Point review of Systems was done is negative except as noted above. ? ?MEDICAL HISTORY:  ?Past Medical History:  ?Diagnosis Date  ? Anemia   ? Glaucoma   ? HYPERTENSION 03/11/2007  ?  Multiple myeloma ([place])   ? OSTEOPENIA 03/11/2007  ? Pre-diabetes   ? Thyroid cancer ([place])   ? Thyroid disease   ? Tubular adenoma of colon 07/2015  ? ? ?SURGICAL HISTORY: ?Past Surgical History:  ?Procedure Laterality Date  ? BONE MARROW BIOPSY    ? multiple  ? BREAST EXCISIONAL BIOPSY Right 2000  ? BREAST LUMPECTOMY  1990  ? benign  ? CATARACT EXTRACTION Bilateral 2018  ? DILATION AND CURETTAGE OF UTERUS    ? bleeding at menopause. No uterine cancer  ? IR IMAGING GUIDED PORT INSERTION  05/16/2021  ? IR RADIOLOGIST EVAL & MGMT  12/13/2018  ? THYROIDECTOMY N/A 08/02/2020  ? Procedure: TOTAL THYROIDECTOMY;  Surgeon: [name], MD;  Location: [facility] ORS;  Service: General;  Laterality: N/A;  ? TONSILLECTOMY    ? age 58  ? ?I have reviewed the social history and family history with the patient and they are unchanged from previous note. ? ?ALLERGIES:  is allergic to ace inhibitors, benadryl [diphenhydramine], diamox [acetazolamide], sulfamethoxazole, lenalidomide, and penicillins. ? ?MEDICATIONS:  ?Current Outpatient Medications  ?Medication Sig Dispense Refill  ? acyclovir (ZOVIRAX) 400 MG tablet Take 1 tablet (400 mg total) by mouth 2 (two) times daily. 60 tablet 3  ? ALPHAGAN P 0.1 % SOLN Place 1 drop into both eyes in the morning, at noon, and at bedtime.     ? amLODipine (NORVASC) 10 MG tablet Take 1 tablet (10 mg total) by mouth daily. 90 tablet 3  ? aspirin EC 81 MG tablet Take 81 mg by mouth daily.    ? bimatoprost (LUMIGAN) 0.03 % ophthalmic solution Place 1 drop into both eyes at bedtime.     ? Cholecalciferol (VITAMIN D3) 125 MCG (5000 UT) TABS Take 5,000 Units by mouth daily.    ? Co-Enzyme Q-10 100 MG CAPS Take 100 mg by mouth daily.    ? dexamethasone (DECADRON) 4 MG tablet Take 5 tablets (44m) on day 22 of each cycle. Repeat every 28 days.  Take with breakfast. (Patient not taking: Reported on 12/01/2021) 12 tablet 3  ? dorzolamide-timolol (COSOPT) 22.3-6.8 MG/ML ophthalmic solution Place 1 drop into both eyes  2 (two) times daily.   11  ? fentaNYL (DURAGESIC) 12 MCG/HR Place 1 patch onto the skin every 3 (three) days. 10 patch 0  ? levothyroxine (SYNTHROID) 88 MCG tablet Take 1 tablet (88 mcg total) by mouth daily before breakfast. 45 tablet 5  ? lidocaine-prilocaine (EMLA) cream Apply 1 application topically as needed. Apply prior to port access. 30 g 0  ? magnesium chloride (SLOW-MAG) 64 MG TBEC SR tablet Take by mouth.    ? Multiple Vitamins-Minerals (MULTIVITAMIN WITH MINERALS) tablet Take 1 tablet by mouth daily. Centrum Silver 50 +    ? Omega-3 1000 MG CAPS Take 1,000 mg by mouth daily.     ? ondansetron (ZOFRAN) 8 MG tablet Take 8 mg by mouth 30 to 60 min prior to Cytoxan administration then take 8 mg twice daily as needed for nausea and vomiting. (Patient not taking: Reported on 12/01/2021) 30 tablet 1  ? oxyCODONE-acetaminophen (PERCOCET) 5-325 MG tablet Take 1-2 tablets by mouth every 4 (four) hours as needed for moderate pain or severe pain. 90 tablet 0  ?  PFIZER COVID-19 VAC BIVALENT injection     ? polyethylene glycol (MIRALAX) packet Take 17 g by mouth daily. 30 each 1  ? prochlorperazine (COMPAZINE) 10 MG tablet Take 1 tablet (10 mg total) by mouth every 6 (six) hours as needed (Nausea or vomiting). (Patient not taking: Reported on 10/28/2021) 30 tablet 1  ? Simethicone (GAS-X PO) Take 1 tablet by mouth as needed.    ? vitamin C (ASCORBIC ACID) 500 MG tablet Take 500 mg by mouth 2 (two) times a week.     ? ?No current facility-administered medications for this visit.  ? ? ?PHYSICAL EXAMINATION:. ?Vital signs reviewed ?NAD ?GENERAL:alert, in no acute distress and comfortable ?SKIN: no acute rashes, no significant lesions ?EYES: conjunctiva are pink and non-injected, sclera anicteric ?OROPHARYNX: MMM, no exudates, no oropharyngeal erythema or ulceration ?NECK: supple, no JVD ?LYMPH:  no palpable lymphadenopathy in the cervical, axillary or inguinal regions ?LUNGS: clear to auscultation b/l with normal  respiratory effort ?HEART: regular rate & rhythm ?ABDOMEN:  normoactive bowel sounds , non tender, not distended. ?Extremity: no pedal edema ?PSYCH: alert & oriented x 3 with fluent speech ?NEURO: no focal motor/senso

## 2021-12-18 ENCOUNTER — Other Ambulatory Visit: Payer: Self-pay

## 2021-12-18 MED ORDER — FENTANYL 12 MCG/HR TD PT72: 1.0000 | MEDICATED_PATCH | TRANSDERMAL | 0 refills | Status: DC

## 2021-12-23 ENCOUNTER — Other Ambulatory Visit: Payer: Self-pay

## 2021-12-23 DIAGNOSIS — C9002 Multiple myeloma in relapse: Secondary | ICD-10-CM

## 2021-12-24 ENCOUNTER — Other Ambulatory Visit: Payer: Self-pay

## 2021-12-24 ENCOUNTER — Inpatient Hospital Stay: Payer: Medicare HMO

## 2021-12-24 VITALS — BP 173/95 | HR 66 | Temp 98.0°F | Resp 18

## 2021-12-24 DIAGNOSIS — Z5112 Encounter for antineoplastic immunotherapy: Secondary | ICD-10-CM | POA: Diagnosis not present

## 2021-12-24 DIAGNOSIS — Z7189 Other specified counseling: Secondary | ICD-10-CM

## 2021-12-24 DIAGNOSIS — C9002 Multiple myeloma in relapse: Secondary | ICD-10-CM

## 2021-12-24 DIAGNOSIS — C9000 Multiple myeloma not having achieved remission: Secondary | ICD-10-CM

## 2021-12-24 LAB — CMP (CANCER CENTER ONLY)
ALT: 13 U/L (ref 0–44)
AST: 13 U/L — ABNORMAL LOW (ref 15–41)
Albumin: 4.2 g/dL (ref 3.5–5.0)
Alkaline Phosphatase: 26 U/L — ABNORMAL LOW (ref 38–126)
Anion gap: 5 (ref 5–15)
BUN: 14 mg/dL (ref 8–23)
CO2: 28 mmol/L (ref 22–32)
Calcium: 9.3 mg/dL (ref 8.9–10.3)
Chloride: 105 mmol/L (ref 98–111)
Creatinine: 0.59 mg/dL (ref 0.44–1.00)
GFR, Estimated: >60 mL/min (ref >60)
Glucose, Bld: 110 mg/dL — ABNORMAL HIGH (ref 70–99)
Potassium: 4.2 mmol/L (ref 3.5–5.1)
Sodium: 138 mmol/L (ref 135–145)
Total Bilirubin: 0.5 mg/dL (ref 0.3–1.2)
Total Protein: 6.4 g/dL — ABNORMAL LOW (ref 6.5–8.1)

## 2021-12-24 LAB — CBC WITH DIFFERENTIAL (CANCER CENTER ONLY)
Abs Immature Granulocytes: 0.02 10*3/uL (ref 0.00–0.07)
Basophils Absolute: 0.1 10*3/uL (ref 0.0–0.1)
Basophils Relative: 1 %
Eosinophils Absolute: 0.1 10*3/uL (ref 0.0–0.5)
Eosinophils Relative: 3 %
HCT: 31.9 % — ABNORMAL LOW (ref 36.0–46.0)
Hemoglobin: 10.5 g/dL — ABNORMAL LOW (ref 12.0–15.0)
Immature Granulocytes: 0 %
Lymphocytes Relative: 8 %
Lymphs Abs: 0.4 10*3/uL — ABNORMAL LOW (ref 0.7–4.0)
MCH: 34.8 pg — ABNORMAL HIGH (ref 26.0–34.0)
MCHC: 32.9 g/dL (ref 30.0–36.0)
MCV: 105.6 fL — ABNORMAL HIGH (ref 80.0–100.0)
Monocytes Absolute: 0.6 10*3/uL (ref 0.1–1.0)
Monocytes Relative: 14 %
Neutro Abs: 3.3 10*3/uL (ref 1.7–7.7)
Neutrophils Relative %: 74 %
Platelet Count: 247 10*3/uL (ref 150–400)
RBC: 3.02 MIL/uL — ABNORMAL LOW (ref 3.87–5.11)
RDW: 12.7 % (ref 11.5–15.5)
WBC Count: 4.5 10*3/uL (ref 4.0–10.5)
nRBC: 0 % (ref 0.0–0.2)

## 2021-12-24 MED ORDER — SODIUM CHLORIDE 0.9% FLUSH: 10.0000 mL | Freq: Once | INTRAVENOUS | Status: DC | PRN

## 2021-12-24 MED ORDER — SODIUM CHLORIDE 0.9 % IV SOLN: Freq: Once | INTRAVENOUS | Status: AC

## 2021-12-24 MED ORDER — ONDANSETRON HCL 4 MG/2ML IJ SOLN
4.0000 mg | Freq: Once | INTRAMUSCULAR | Status: AC
  Administered 2021-12-24: 4 mg via INTRAVENOUS
  Filled 2021-12-24: qty 2

## 2021-12-24 MED ORDER — SODIUM CHLORIDE 0.9% FLUSH
10.0000 mL | INTRAVENOUS | Status: DC | PRN
  Administered 2021-12-24: 10 mL

## 2021-12-24 MED ORDER — DEXAMETHASONE SODIUM PHOSPHATE 10 MG/ML IJ SOLN
4.0000 mg | Freq: Once | INTRAMUSCULAR | Status: AC
  Administered 2021-12-24: 4 mg via INTRAVENOUS
  Filled 2021-12-24: qty 1

## 2021-12-24 MED ORDER — DEXTROSE 5 % IV SOLN
56.0000 mg/m2 | Freq: Once | INTRAVENOUS | Status: AC
  Administered 2021-12-24: 80 mg via INTRAVENOUS
  Filled 2021-12-24: qty 30

## 2021-12-24 MED ORDER — ACETAMINOPHEN 500 MG PO TABS
1000.0000 mg | ORAL_TABLET | Freq: Once | ORAL | Status: AC
  Administered 2021-12-24: 1000 mg via ORAL
  Filled 2021-12-24: qty 2

## 2021-12-24 MED ORDER — HEPARIN SOD (PORK) LOCK FLUSH 100 UNIT/ML IV SOLN
500.0000 [IU] | Freq: Once | INTRAVENOUS | Status: AC | PRN
  Administered 2021-12-24: 500 [IU]

## 2021-12-24 NOTE — Patient Instructions (Signed)
[facility] MEDICAL ONCOLOGY  Discharge Instructions: Thank you for choosing [facility] to provide your oncology and hematology care.   If you have a lab appointment with the Cancer Center, please go directly to the Cancer Center and check in at the registration area.   Wear comfortable clothing and clothing appropriate for easy access to any Portacath or PICC line.   We strive to give you quality time with your provider. You may need to reschedule your appointment if you arrive late (15 or more minutes).  Arriving late affects you and other patients whose appointments are after yours.  Also, if you miss three or more appointments without notifying the office, you may be dismissed from the clinic at the provider's discretion.      For prescription refill requests, have your pharmacy contact our office and allow 72 hours for refills to be completed.    Today you received the following chemotherapy and/or immunotherapy agents kyprolis      To help prevent nausea and vomiting after your treatment, we encourage you to take your nausea medication as directed.  BELOW ARE SYMPTOMS THAT SHOULD BE REPORTED IMMEDIATELY: . *FEVER GREATER THAN 100.4 F (38 C) OR HIGHER . *CHILLS OR SWEATING . *NAUSEA AND VOMITING THAT IS NOT CONTROLLED WITH YOUR NAUSEA MEDICATION . *UNUSUAL SHORTNESS OF BREATH . *UNUSUAL BRUISING OR BLEEDING . *URINARY PROBLEMS (pain or burning when urinating, or frequent urination) . *BOWEL PROBLEMS (unusual diarrhea, constipation, pain near the anus) . TENDERNESS IN MOUTH AND THROAT WITH OR WITHOUT PRESENCE OF ULCERS (sore throat, sores in mouth, or a toothache) . UNUSUAL RASH, SWELLING OR PAIN  . UNUSUAL VAGINAL DISCHARGE OR ITCHING   Items with * indicate a potential emergency and should be followed up as soon as possible or go to the Emergency Department if any problems should occur.  Please show the CHEMOTHERAPY ALERT CARD or IMMUNOTHERAPY ALERT  CARD at check-in to the Emergency Department and triage nurse.  Should you have questions after your visit or need to cancel or reschedule your appointment, please contact [facility]ICAL [facility]  Dept: [phone]  and follow the prompts.  Office hours are 8:00 a.m. to 4:30 p.m. Monday - Friday. Please note that voicemails left after 4:00 p.m. may not be returned until the following business day.  We are closed weekends and major holidays. You have access to a nurse at all times for urgent questions. Please call the main number to the clinic Dept: [phone] and follow the prompts.   For any non-urgent questions, you may also contact your provider using MyChart. We now offer e-Visits for anyone 18 and older to request care online for non-urgent symptoms. For details visit [email or web address].   Also download the MyChart app! Go to the app store, search "MyChart", open the app, select [facility], and log in with your MyChart username and password.  Due to Covid, a mask is required upon entering the hospital/clinic. If you do not have a mask, one will be given to you upon arrival. For doctor visits, patients may have 1 support person aged 18 or older with them. For treatment visits, patients cannot have anyone with them due to current Covid guidelines and our immunocompromised population.   

## 2021-12-26 ENCOUNTER — Emergency Department (HOSPITAL_COMMUNITY): Payer: Medicare HMO

## 2021-12-26 ENCOUNTER — Telehealth: Payer: Self-pay

## 2021-12-26 ENCOUNTER — Emergency Department (HOSPITAL_COMMUNITY)
Admission: EM | Admit: 2021-12-26 | Discharge: 2021-12-26 | Disposition: A | Discharge status: Home / Self Care | Payer: Medicare HMO | Attending: Emergency Medicine | Admitting: Emergency Medicine

## 2021-12-26 ENCOUNTER — Other Ambulatory Visit: Payer: Self-pay

## 2021-12-26 ENCOUNTER — Encounter (HOSPITAL_COMMUNITY): Payer: Self-pay | Admitting: Emergency Medicine

## 2021-12-26 DIAGNOSIS — R1030 Lower abdominal pain, unspecified: Secondary | ICD-10-CM | POA: Diagnosis not present

## 2021-12-26 DIAGNOSIS — K8020 Calculus of gallbladder without cholecystitis without obstruction: Secondary | ICD-10-CM | POA: Diagnosis not present

## 2021-12-26 DIAGNOSIS — Z8579 Personal history of other malignant neoplasms of lymphoid, hematopoietic and related tissues: Secondary | ICD-10-CM | POA: Diagnosis not present

## 2021-12-26 DIAGNOSIS — K5900 Constipation, unspecified: Secondary | ICD-10-CM | POA: Insufficient documentation

## 2021-12-26 DIAGNOSIS — R112 Nausea with vomiting, unspecified: Secondary | ICD-10-CM | POA: Insufficient documentation

## 2021-12-26 DIAGNOSIS — E876 Hypokalemia: Secondary | ICD-10-CM | POA: Insufficient documentation

## 2021-12-26 DIAGNOSIS — Z7982 Long term (current) use of aspirin: Secondary | ICD-10-CM | POA: Diagnosis not present

## 2021-12-26 LAB — URINALYSIS, ROUTINE W REFLEX MICROSCOPIC
Bilirubin Urine: NEGATIVE
Glucose, UA: NEGATIVE mg/dL
Hgb urine dipstick: NEGATIVE
Ketones, ur: 5 mg/dL — AB
Leukocytes,Ua: NEGATIVE
Nitrite: NEGATIVE
Protein, ur: 100 mg/dL — AB
Specific Gravity, Urine: 1.021 (ref 1.005–1.030)
pH: 5 (ref 5.0–8.0)

## 2021-12-26 LAB — COMPREHENSIVE METABOLIC PANEL
ALT: 14 U/L (ref 0–44)
AST: 14 U/L — ABNORMAL LOW (ref 15–41)
Albumin: 3.7 g/dL (ref 3.5–5.0)
Alkaline Phosphatase: 30 U/L — ABNORMAL LOW (ref 38–126)
Anion gap: 10 (ref 5–15)
BUN: 30 mg/dL — ABNORMAL HIGH (ref 8–23)
CO2: 22 mmol/L (ref 22–32)
Calcium: 8 mg/dL — ABNORMAL LOW (ref 8.9–10.3)
Chloride: 101 mmol/L (ref 98–111)
Creatinine, Ser: 0.71 mg/dL (ref 0.44–1.00)
GFR, Estimated: >60 mL/min (ref >60)
Glucose, Bld: 177 mg/dL — ABNORMAL HIGH (ref 70–99)
Potassium: 3 mmol/L — ABNORMAL LOW (ref 3.5–5.1)
Sodium: 133 mmol/L — ABNORMAL LOW (ref 135–145)
Total Bilirubin: 0.6 mg/dL (ref 0.3–1.2)
Total Protein: 6.3 g/dL — ABNORMAL LOW (ref 6.5–8.1)

## 2021-12-26 LAB — CBC
HCT: 34 % — ABNORMAL LOW (ref 36.0–46.0)
Hemoglobin: 11.7 g/dL — ABNORMAL LOW (ref 12.0–15.0)
MCH: 36.3 pg — ABNORMAL HIGH (ref 26.0–34.0)
MCHC: 34.4 g/dL (ref 30.0–36.0)
MCV: 105.6 fL — ABNORMAL HIGH (ref 80.0–100.0)
Platelets: 213 10*3/uL (ref 150–400)
RBC: 3.22 MIL/uL — ABNORMAL LOW (ref 3.87–5.11)
RDW: 13.1 % (ref 11.5–15.5)
WBC: 15.2 10*3/uL — ABNORMAL HIGH (ref 4.0–10.5)
nRBC: 0 % (ref 0.0–0.2)

## 2021-12-26 LAB — LIPASE, BLOOD: Lipase: 20 U/L (ref 11–51)

## 2021-12-26 MED ORDER — SODIUM CHLORIDE 0.9 % IV BOLUS
500.0000 mL | Freq: Once | INTRAVENOUS | Status: AC
  Administered 2021-12-26: 500 mL via INTRAVENOUS

## 2021-12-26 MED ORDER — POTASSIUM CHLORIDE CRYS ER 20 MEQ PO TBCR
40.0000 meq | EXTENDED_RELEASE_TABLET | Freq: Once | ORAL | Status: AC
  Administered 2021-12-26: 40 meq via ORAL
  Filled 2021-12-26: qty 2

## 2021-12-26 MED ORDER — LACTULOSE 10 GM/15ML PO SOLN
20.0000 g | Freq: Two times a day (BID) | ORAL | 0 refills | Status: DC | PRN
Start: 2021-12-26 — End: 2022-01-13

## 2021-12-26 MED ORDER — LACTULOSE 10 GM/15ML PO SOLN
20.0000 g | Freq: Once | ORAL | Status: AC
  Administered 2021-12-26: 20 g via ORAL
  Filled 2021-12-26: qty 30

## 2021-12-26 NOTE — ED Triage Notes (Signed)
Patient sent from PCP for possible bowel obstruction. She reports abdominal pain and distention for several days as well as nausea & vomiting. She reports hx of multiple myeloma.  ?

## 2021-12-26 NOTE — ED Notes (Signed)
750 ml of soap suds enema infused into rectum, patient tolerated well ?

## 2021-12-26 NOTE — ED Provider Notes (Signed)
?[facility] ?Provider Note ? ? ?CSN: [id number] ?Arrival date & time: 12/26/21  1450 ? ?  ? ?History ? ?Chief Complaint  ?Patient presents with  ? Abdominal Pain  ? ? ?Megan Olson is a 81 y.o. female. ? ?Patient is a 81 year old female who presents with vomiting and constipation is concerned about a small bowel obstruction.  She has a history of multiple myeloma currently undergoing chemotherapy.  She said that she has had some nausea over the last few days with no bowel movement in the last 4 to 5 days.  She does have a prior history of constipation although its been fairly well controlled recently.  She takes MiraLAX daily.  She has some chronic abdominal distention which she says is unchanged.  She has some intermittent abdominal pain but denies any current pain.  She has had some nausea and started having some vomiting 2 days ago.  Initially it was dry heaves, then it was actual vomiting.  She had an episode of dark emesis yesterday.  Today it is just been dry heaves.  No known fevers.  She has had some decreased urination with dark urine but no burning on urination or urgency.  She tried a glycerin suppository and an enema 2 days ago without improvement in symptoms. ? ? ?  ? ?Home Medications ?Prior to Admission medications   ?Medication Sig Start Date End Date Taking? Authorizing Provider  ?lactulose (CHRONULAC) 10 GM/15ML solution Take 30 mLs (20 g total) by mouth 2 (two) times daily as needed for moderate constipation. 12/26/21  Yes Malvin Johns, MD  ?acyclovir (ZOVIRAX) 400 MG tablet Take 1 tablet (400 mg total) by mouth 2 (two) times daily. 10/27/21   [name], MD  ?[facility] P 0.1 % SOLN Place 1 drop into both eyes in the morning, at noon, and at bedtime.  02/02/17   [provider]  ?amLODipine (NORVASC) 10 MG tablet Take 1 tablet (10 mg total) by mouth daily. 10/13/21   [name], MD  ?aspirin EC 81 MG tablet Take 81 mg by mouth daily.     [provider]  ?bimatoprost (LUMIGAN) 0.03 % ophthalmic solution Place 1 drop into both eyes at bedtime.     [provider]  ?Cholecalciferol (VITAMIN D3) 125 MCG (5000 UT) TABS Take 5,000 Units by mouth daily.    [provider]  ?Co-Enzyme Q-10 100 MG CAPS Take 100 mg by mouth daily.    [provider]  ?dexamethasone (DECADRON) 4 MG tablet Take 5 tablets ($RemoveBe'20mg'[id number]$ ) on day 22 of each cycle. Repeat every 28 days.  Take with breakfast. ?Patient not taking: Reported on 12/01/2021 04/16/21   [name], MD  ?dorzolamide-timolol (COSOPT) 22.3-6.8 MG/ML ophthalmic solution Place 1 drop into both eyes 2 (two) times daily.  03/01/17   [provider]  ?fentaNYL (DURAGESIC) 12 MCG/HR Place 1 patch onto the skin every 3 (three) days. 12/18/21   [name], MD  ?levothyroxine (SYNTHROID) 88 MCG tablet Take 1 tablet (88 mcg total) by mouth daily before breakfast. 07/01/21   [name], MD  ?lidocaine-prilocaine (EMLA) cream Apply 1 application topically as needed. Apply prior to port access. 05/29/21   [name], MD  ?magnesium chloride (SLOW-MAG) 64 MG TBEC SR tablet Take by mouth.    [provider]  ?Multiple Vitamins-Minerals (MULTIVITAMIN WITH MINERALS) tablet Take 1 tablet by mouth daily. Centrum Silver 50 +    [provider]  ?Omega-3  1000 MG CAPS Take 1,000 mg by mouth daily.     [provider]  ?ondansetron (ZOFRAN) 8 MG tablet Take 8 mg by mouth 30 to 60 min prior to Cytoxan administration then take 8 mg twice daily as needed for nausea and vomiting. ?Patient not taking: Reported on 12/01/2021 04/16/21   [name], MD  ?oxyCODONE-acetaminophen (PERCOCET) 5-325 MG tablet Take 1-2 tablets by mouth every 4 (four) hours as needed for moderate pain or severe pain. 11/28/21   [name], MD  ?PFIZER COVID-19 [facility] BIVALENT injection  07/04/21   [provider]  ?polyethylene glycol (MIRALAX)  packet Take 17 g by mouth daily. 12/12/18   [name], MD  ?prochlorperazine (COMPAZINE) 10 MG tablet Take 1 tablet (10 mg total) by mouth every 6 (six) hours as needed (Nausea or vomiting). ?Patient not taking: Reported on 10/28/2021 04/16/21   [name], MD  ?Simethicone (GAS-X PO) Take 1 tablet by mouth as needed.    [provider]  ?vitamin C (ASCORBIC ACID) 500 MG tablet Take 500 mg by mouth 2 (two) times a week.  02/02/19   [provider]  ?   ? ?Allergies    ?Ace inhibitors, Benadryl [diphenhydramine], Diamox [acetazolamide], Sulfamethoxazole, Lenalidomide, and Penicillins   ? ?Review of Systems   ?Review of Systems  ?Constitutional:  Negative for chills, diaphoresis, fatigue and fever.  ?HENT:  Negative for congestion, rhinorrhea and sneezing.   ?Eyes: Negative.   ?Respiratory:  Negative for cough, chest tightness and shortness of breath.   ?Cardiovascular:  Negative for chest pain and leg swelling.  ?Gastrointestinal:  Positive for abdominal pain, nausea and vomiting. Negative for blood in stool and diarrhea.  ?Genitourinary:  Negative for difficulty urinating, flank pain, frequency and hematuria.  ?Musculoskeletal:  Negative for arthralgias and back pain.  ?Skin:  Negative for rash.  ?Neurological:  Negative for dizziness, speech difficulty, weakness, numbness and headaches.  ? ?Physical Exam ?Updated Vital Signs ?BP (!) 186/104   Pulse 75   Temp 98.8 ?F (37.1 ?C) (Oral)   Resp 15   SpO2 99%  ?Physical Exam ?Constitutional:   ?   Appearance: She is well-developed.  ?HENT:  ?   Head: Normocephalic and atraumatic.  ?Eyes:  ?   Pupils: Pupils are equal, round, and reactive to light.  ?Cardiovascular:  ?   Rate and Rhythm: Normal rate and regular rhythm.  ?   Heart sounds: Normal heart sounds.  ?Pulmonary:  ?   Effort: Pulmonary effort is normal. No respiratory distress.  ?   Breath sounds: Normal breath sounds. No wheezing or rales.  ?Chest:  ?   Chest wall: No  tenderness.  ?Abdominal:  ?   General: Bowel sounds are normal. There is distension.  ?   Palpations: Abdomen is soft.  ?   Tenderness: There is no abdominal tenderness. There is no guarding or rebound.  ?   Comments: Tympanic to percussion  ?Genitourinary: ?   Comments: No impaction ?Musculoskeletal:     ?   General: Normal range of motion.  ?   Cervical back: Normal range of motion and neck supple.  ?Lymphadenopathy:  ?   Cervical: No cervical adenopathy.  ?Skin: ?   General: Skin is warm and dry.  ?   Findings: No rash.  ?Neurological:  ?   Mental Status: She is alert and oriented to person, place, and time.  ? ? ?ED Results / Procedures / Treatments   ?Labs ?(  all labs ordered are listed, but only abnormal results are displayed) ?Labs Reviewed  ?COMPREHENSIVE METABOLIC PANEL - Abnormal; Notable for the following components:  ?    Result Value  ? Sodium 133 (*)   ? Potassium 3.0 (*)   ? Glucose, Bld 177 (*)   ? BUN 30 (*)   ? Calcium 8.0 (*)   ? Total Protein 6.3 (*)   ? AST 14 (*)   ? Alkaline Phosphatase 30 (*)   ? All other components within normal limits  ?CBC - Abnormal; Notable for the following components:  ? WBC 15.2 (*)   ? RBC 3.22 (*)   ? Hemoglobin 11.7 (*)   ? HCT 34.0 (*)   ? MCV 105.6 (*)   ? MCH 36.3 (*)   ? All other components within normal limits  ?URINALYSIS, ROUTINE W REFLEX MICROSCOPIC - Abnormal; Notable for the following components:  ? Color, Urine AMBER (*)   ? APPearance HAZY (*)   ? Ketones, ur 5 (*)   ? Protein, ur 100 (*)   ? Bacteria, UA RARE (*)   ? All other components within normal limits  ?LIPASE, BLOOD  ? ? ?EKG ?None ? ?Radiology ?CT Abdomen Pelvis Wo Contrast ? ?Result Date: 12/26/2021 ?CLINICAL DATA:  Multiple myeloma, decreased bowel movements, concern for obstruction, weakness, dehydration EXAM: CT ABDOMEN AND PELVIS WITHOUT CONTRAST TECHNIQUE: Multidetector CT imaging of the abdomen and pelvis was performed following the standard protocol without IV contrast. Unenhanced CT  was performed per clinician order. Lack of IV contrast limits sensitivity and specificity, especially for evaluation of abdominal/pelvic solid viscera. RADIATION DOSE REDUCTION: This exam was performed according to the de

## 2021-12-26 NOTE — Discharge Instructions (Signed)
Take the lactulose as discussed.  Follow-up with your oncologist this week for recheck.  Return to the emergency room if you have any worsening symptoms including worsening abdominal pain, vomiting, fevers or other worsening symptoms. ?

## 2021-12-26 NOTE — ED Notes (Signed)
Infused 750 ml of soap suds into rectum, patient is on bedside commode ?

## 2021-12-26 NOTE — ED Provider Triage Note (Signed)
Emergency Medicine Provider Triage Evaluation Note ? ?Megan Olson , a 81 y.o. female  was evaluated in triage.  Pt complains of suspected bowel obstruction.  Patient is a cancer patient with multiple myeloma currently undergoing treatment with her last treatment 2 days ago.  Per patient, she has not had a bowel movement in approximately 6 days and has been dry heaving/vomiting for the last 2 days.  She also endorses significantly increased weakness with brown urine and believes that she is dehydrated.  She has been unable to keep any food down for 2 days.  She does note that the distention her stomach has been present since she was diagnosed with mild myeloma. ?Review of Systems  ?Positive: Vomiting, weakness, abdominal pain, constipation, brown urine ?Negative: Fevers, chest pain ? ?Physical Exam  ?BP (!) 155/100 (BP Location: Right Arm)   Pulse 92   Temp 98.4 ?F (36.9 ?C) (Oral)   Resp 12   SpO2 100%  ?Gen:   Awake, no distress,  ?Resp:  Normal effort  ?MSK:   Moves extremities without difficulty  ?Other:  Abdomen is distended/protuberant, mildly tender in the lower quadrants, worse in the left quadrant.  Normoactive bowel sounds ? ?Medical Decision Making  ?Medically screening exam initiated at 3:31 PM.  Appropriate orders placed.  Megan Olson was informed that the remainder of the evaluation will be completed by another provider, this initial triage assessment does not replace that evaluation, and the importance of remaining in the ED until their evaluation is complete. ? ? ?  ?Tonye Pearson, [place] ?12/26/21 1533 ? ?

## 2021-12-26 NOTE — Telephone Encounter (Signed)
Pt has had 2 days of n/v and constipation. Pt unable to keep anything po down. Pt states urine is dark. Pt used suppository yesterday with minimal results. Pt states emesis now slightly brown. Per Dr Irene Limbo encouraged to go to ED. Pt verbalized understanding . Pt plans to go to ED.  ?

## 2021-12-26 NOTE — ED Notes (Signed)
Patient transported to CT via stretcher.

## 2021-12-29 ENCOUNTER — Ambulatory Visit: Payer: Medicare HMO | Admitting: Internal Medicine

## 2021-12-29 NOTE — Progress Notes (Deleted)
Patient ID: Megan Olson, female   DOB: Jul 21, 1941, 81 y.o.   MRN: [id number]  ? ?This visit occurred during the SARS-CoV-2 public health emergency.  Safety protocols were in place, including screening questions prior to the visit, additional usage of staff PPE, and extensive cleaning of exam room while observing appropriate contact time as indicated for disinfecting solutions.  ? ?HPI  ?Megan Olson is a 81 y.o.-year-old very nice female, initially referred by her PCP, Dr. Yong Channel, returning for follow-up for newly diagnosed thyroid cancer and prediabetes.  Last visit 6  months ago. ? ?Interim history: ?She continues chemotherapy for multiple myeloma - changed from oral to iv, and getting Dexametasone po or iv. ?She has 4 week cycles of ChTx with iv Dex, then 1 week off with po Dex. She is into the 3rd cycle now (out of 5-6 cycles). ?She does not have increased fatigue or weight loss. ? ?Papillary thyroid cancer: ? ?Reviewed history: ?Patient was noticed to have a FDG-avid thyroid nodule on recent PET/CT scans obtained for patient's multiple myeloma.  A thyroid ultrasound that showed 2 nodules meeting criteria for biopsy.  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  She had total thyroidectomy by Dr. Harlow Asa. ? ?PET/CT (07/17/2019): CHEST: Persistent hypermetabolism in a partially calcified 1.8 cm ?left thyroid nodule, SUV max 4.5. No hypermetabolic mediastinal, hilar or axillary lymph nodes. ? ?PET/CT (09/14/2019): HEAD/NECK: Calcified 1.5 cm in diameter left thyroid nodule maximum SUV 6.7, previously 7.7. ? ?Thyroid U/S (06/24/2020): ?Parenchymal Echotexture: Mildly heterogenous ?Isthmus: Normal in size measuring 0.3 cm in diameter ?Right lobe: Atrophic in size measuring 3.7 x 1.3 x 1.5 cm ?Left lobe: Atrophic in size measuring 3.4 x 1.6 x 1.8 cm ?_________________________________________________________ ?  ?Estimated total number of nodules >/= 1 cm: 2 ?  ?Number of spongiform nodules >/=  2 cm not  described below (TR1): 0 ?  ?Number of mixed cystic and solid nodules >/= 1.5 cm not described ?below ([place]): 0 ? _________________________________________________________ ?  ?Scattered punctate (sub 5 mm) anechoic cysts and hypoechoic nodules ?within the right lobe of the thyroid, several which contain internal ?echogenic foci with ring down artifact compatible with benign ?colloid. None of these punctate nodules meet imaging criteria to ?recommend percutaneous sampling or continued dedicated follow-up. ? ________________________________________________________ ?  ?Nodule # 1: ?Location: Left; Mid - this nodule appears hypermetabolic on preceding PET-CT ?Maximum size: 2.0 cm; Other 2 dimensions: 1.8 x 1.7 cm ?Composition: cannot determine (2) ?Echogenicity: cannot determine (1) ?Shape: not taller-than-wide (0) ?Margins: lobulated/irregular (2) ?Echogenic foci: peripheral calcifications (2) ?Additional echogenic foci 1:  macrocalcifications (1)  ? ?**Given size (>/= 1.0 cm) and appearance, fine needle aspiration of ?this highly suspicious nodule should be considered based on TI-RADS ?criteria. ? _________________________________________________________ ?  ?Nodule # 2: ?Location: Left; mid - this nodule appears hypermetabolic on preceding PET-CT ?Maximum size: 1.2 cm; Other 2 dimensions: 0.9 x 0.7 cm ?Composition: solid/almost completely solid (2) ?Echogenicity: hypoechoic (2) ?Shape: not taller-than-wide (0) ?Margins: lobulated/irregular (2) ?Echogenic foci: none (0) ?*Given size (>/= 1 - 1.4 cm) and appearance, a follow-up ultrasound ?in 1 year should be considered based on TI-RADS criteria. However - see below: ?Bx recommended. ? _________________________________________________________ ?  ?There is an additional punctate (approximately 0.5 cm) anechoic cyst ?within left lobe of the thyroid which does not meet criteria to ?recommend percutaneous sampling or continued dedicated follow-up. ?  ?IMPRESSION: ?1.  Findings suggestive of multinodular goiter. ?2. Nodule #1,correlating with one of hypermetabolic nodules seen  on ?preceding PET-CT, meets imaging criteria to recommend percutaneous ?sampling as clinically indicated. ?3. Nodule #2 meets TIRADS criteria to only recommend a 1 year ?follow-up, though as this nodule also appears hypermetabolic on ?preceding PET-CT, percutaneous sampling is also recommended for this ?nodule as hypermetabolic nodules have a higher rate of harboring ?malignancy. ?4. None of the remaining thyroid nodules/cysts meet imaging criteria ?to recommend percutaneous sampling or continued dedicated follow-up. ? ?Biopsy of the above 2 nodules. ? ?FNAs (07/10/2020): ?Specimen Submitted:  A. THYROID,LEFT MID #2, FINE NEEDLE ASPIRATION:  ?FINAL MICROSCOPIC DIAGNOSIS:  ?- Findings consistent with papillary carcinoma (Bethesda category VI)  ?SPECIMEN ADEQUACY:  ?Satisfactory for evaluation  ? ?Specimen Submitted:  A. THYROID,LEFT MID #1, FINE NEEDLE ASPIRATION:  ?FINAL MICROSCOPIC DIAGNOSIS:  ?- Findings consistent with papillary carcinoma (Bethesda category VI)  ?SPECIMEN ADEQUACY:  ?Satisfactory for evaluation ? ?Total thyroidectomy (08/02/2020): ?A. THYROID, TOTAL THYROIDECTOMY:  ?- Papillary thyroid carcinoma, classical variant, at least 3 cm  ?- Portion of the tumor shows hyaline fibrosis and calcification  ?- Anterior surface resection margin is involved by tumor  ?- Definite evidence of extrathyroidal extension is not identified  ?- Negative for lymphovascular invasion  ? ?THYROID GLAND:  ? ?Procedure: Total thyroidectomy  ?Tumor Focality: Unifocal  ?Tumor Site: Mid and inferior pole of left lobe  ?Tumor Size: At least 3 cm  ?Histologic Type: Papillary thyroid carcinoma, classical variant  ?Margins: The anterior surface margin is involved by carcinoma  ?Angioinvasion: Not identified  ?Lymphatic Invasion: Not identified  ?Extrathyroidal extension: Not identified  ?Regional Lymph Nodes: No lymph nodes  submitted or found  ?Pathologic Stage Classification (pTNM, AJCC 8th Edition): pT2, pN not  assigned (no nodes submitted or found)  ?Comment(s): Though 2 separate lesions were described grossly, they  appeared to be part of a single tumor with prominent fibrosis and calcification of a portion of the tumor.   ? ?Lab Results  ?Component Value Date  ? THYROGLB 1.0 (L) 06/30/2021  ? THYROGLB 0.4 (L) 09/05/2020  ? ?Lab Results  ?Component Value Date  ? THGAB <1 06/30/2021  ? THGAB <1 09/05/2020  ? ?Pt denies: ?- feeling nodules in neck ?- hoarseness ?- dysphagia ?- choking ?- SOB with lying down ? ?At last visit, she was on levothyroxine 88 mcg daily but due to diarrhea we decreased the dose to 75 mcg daily.  ?However, TSH was elevated, so we increased the dose to 100 mcg daily.  She takes this: ?- in am ?- fasting ?- at least 1h  from b'fast ?- off calcium ?- no iron ?- + multivitamins at lunchtime ?- no PPIs ?- not on Biotin ?- + slow Mag with calcium at dinner ? ?Reviewed her TFTs: ?Lab Results  ?Component Value Date  ? TSH 2.74 08/11/2021  ? TSH 7.50 (H) 06/30/2021  ? TSH 3.17 03/17/2021  ? TSH 1.96 10/30/2020  ? TSH 0.56 09/05/2020  ? TSH 1.006 09/19/2019  ? TSH 1.84 03/25/2016  ? TSH 1.47 03/22/2015  ? TSH 1.58 03/19/2014  ? TSH 1.79 03/08/2013  ? FREET4 0.77 06/30/2021  ? FREET4 0.86 03/17/2021  ? FREET4 0.90 10/30/2020  ? FREET4 1.04 09/05/2020  ? FREET4 1.10 09/19/2019  ?  ?She has family history of thyroid disease: Hashimoto's thyroidism in sister. No FH of thyroid cancer. No h/o radiation tx to head or neck. ? ?No herbal supplements. No Biotin use.  ? ?Prediabetes: ? ?Reviewed HbA1c levels: ?Lab Results  ?Component Value Date  ? HGBA1C 5.9 (A) 01/13/2021  ?  HGBA1C 5.9 (A) 06/18/2020  ? HGBA1C 5.8 (H) 01/26/2020  ? HGBA1C 5.6 10/10/2018  ? HGBA1C 6.3 04/04/2018  ? HGBA1C 5.9 09/17/2017  ? HGBA1C 6.2 03/26/2017  ? HGBA1C 5.9 03/22/2015  ? ?She does not check blood sugars at home. ? ?+ HL: ?Lab Results   ?Component Value Date  ? CHOL 220 (H) 01/26/2020  ? HDL 68 01/26/2020  ? LDLCALC 132 (H) 01/26/2020  ? TRIG 97 01/26/2020  ? CHOLHDL 3.2 01/26/2020  ?She is not on a statin. ? ?Review most recent kidney function: ?Lab

## 2022-01-06 ENCOUNTER — Other Ambulatory Visit: Payer: Self-pay

## 2022-01-06 DIAGNOSIS — C9002 Multiple myeloma in relapse: Secondary | ICD-10-CM

## 2022-01-07 ENCOUNTER — Inpatient Hospital Stay: Payer: Medicare HMO | Attending: Hematology

## 2022-01-07 ENCOUNTER — Other Ambulatory Visit: Payer: Medicare HMO

## 2022-01-07 ENCOUNTER — Inpatient Hospital Stay: Payer: Medicare HMO

## 2022-01-07 ENCOUNTER — Ambulatory Visit: Payer: Medicare HMO

## 2022-01-07 ENCOUNTER — Inpatient Hospital Stay (HOSPITAL_BASED_OUTPATIENT_CLINIC_OR_DEPARTMENT_OTHER): Payer: Medicare HMO | Admitting: Physician Assistant

## 2022-01-07 ENCOUNTER — Other Ambulatory Visit: Payer: Self-pay

## 2022-01-07 VITALS — BP 155/83 | HR 60 | Temp 97.7°F | Resp 18 | Wt 95.9 lb

## 2022-01-07 DIAGNOSIS — Z5112 Encounter for antineoplastic immunotherapy: Secondary | ICD-10-CM | POA: Insufficient documentation

## 2022-01-07 DIAGNOSIS — Z7189 Other specified counseling: Secondary | ICD-10-CM

## 2022-01-07 DIAGNOSIS — M8580 Other specified disorders of bone density and structure, unspecified site: Secondary | ICD-10-CM | POA: Insufficient documentation

## 2022-01-07 DIAGNOSIS — Z79899 Other long term (current) drug therapy: Secondary | ICD-10-CM | POA: Insufficient documentation

## 2022-01-07 DIAGNOSIS — C9002 Multiple myeloma in relapse: Secondary | ICD-10-CM

## 2022-01-07 DIAGNOSIS — E079 Disorder of thyroid, unspecified: Secondary | ICD-10-CM | POA: Diagnosis not present

## 2022-01-07 DIAGNOSIS — C9000 Multiple myeloma not having achieved remission: Secondary | ICD-10-CM | POA: Insufficient documentation

## 2022-01-07 DIAGNOSIS — Z95828 Presence of other vascular implants and grafts: Secondary | ICD-10-CM

## 2022-01-07 LAB — CMP (CANCER CENTER ONLY)
ALT: 12 U/L (ref 0–44)
AST: 12 U/L — ABNORMAL LOW (ref 15–41)
Albumin: 3.8 g/dL (ref 3.5–5.0)
Alkaline Phosphatase: 24 U/L — ABNORMAL LOW (ref 38–126)
Anion gap: 5 (ref 5–15)
BUN: 12 mg/dL (ref 8–23)
CO2: 27 mmol/L (ref 22–32)
Calcium: 8.6 mg/dL — ABNORMAL LOW (ref 8.9–10.3)
Chloride: 107 mmol/L (ref 98–111)
Creatinine: 0.52 mg/dL (ref 0.44–1.00)
GFR, Estimated: >60 mL/min (ref >60)
Glucose, Bld: 111 mg/dL — ABNORMAL HIGH (ref 70–99)
Potassium: 4.3 mmol/L (ref 3.5–5.1)
Sodium: 139 mmol/L (ref 135–145)
Total Bilirubin: 0.5 mg/dL (ref 0.3–1.2)
Total Protein: 5.9 g/dL — ABNORMAL LOW (ref 6.5–8.1)

## 2022-01-07 LAB — CBC WITH DIFFERENTIAL (CANCER CENTER ONLY)
Abs Immature Granulocytes: 0.03 10*3/uL (ref 0.00–0.07)
Basophils Absolute: 0.1 10*3/uL (ref 0.0–0.1)
Basophils Relative: 2 %
Eosinophils Absolute: 0.1 10*3/uL (ref 0.0–0.5)
Eosinophils Relative: 2 %
HCT: 30.4 % — ABNORMAL LOW (ref 36.0–46.0)
Hemoglobin: 10 g/dL — ABNORMAL LOW (ref 12.0–15.0)
Immature Granulocytes: 1 %
Lymphocytes Relative: 8 %
Lymphs Abs: 0.3 10*3/uL — ABNORMAL LOW (ref 0.7–4.0)
MCH: 35.3 pg — ABNORMAL HIGH (ref 26.0–34.0)
MCHC: 32.9 g/dL (ref 30.0–36.0)
MCV: 107.4 fL — ABNORMAL HIGH (ref 80.0–100.0)
Monocytes Absolute: 0.6 10*3/uL (ref 0.1–1.0)
Monocytes Relative: 15 %
Neutro Abs: 2.7 10*3/uL (ref 1.7–7.7)
Neutrophils Relative %: 72 %
Platelet Count: 395 10*3/uL (ref 150–400)
RBC: 2.83 MIL/uL — ABNORMAL LOW (ref 3.87–5.11)
RDW: 13.7 % (ref 11.5–15.5)
WBC Count: 3.7 10*3/uL — ABNORMAL LOW (ref 4.0–10.5)
nRBC: 0 % (ref 0.0–0.2)

## 2022-01-07 MED ORDER — DEXAMETHASONE SODIUM PHOSPHATE 10 MG/ML IJ SOLN
4.0000 mg | Freq: Once | INTRAMUSCULAR | Status: AC
  Administered 2022-01-07: 4 mg via INTRAVENOUS
  Filled 2022-01-07: qty 1

## 2022-01-07 MED ORDER — SODIUM CHLORIDE 0.9 % IV SOLN: Freq: Once | INTRAVENOUS | Status: AC

## 2022-01-07 MED ORDER — SODIUM CHLORIDE 0.9% FLUSH
10.0000 mL | INTRAVENOUS | Status: AC | PRN
  Administered 2022-01-07: 10 mL

## 2022-01-07 MED ORDER — ONDANSETRON HCL 4 MG/2ML IJ SOLN
4.0000 mg | Freq: Once | INTRAMUSCULAR | Status: AC
  Administered 2022-01-07: 4 mg via INTRAVENOUS
  Filled 2022-01-07: qty 2

## 2022-01-07 MED ORDER — ACETAMINOPHEN 500 MG PO TABS
1000.0000 mg | ORAL_TABLET | Freq: Once | ORAL | Status: AC
  Administered 2022-01-07: 1000 mg via ORAL
  Filled 2022-01-07: qty 2

## 2022-01-07 MED ORDER — DEXTROSE 5 % IV SOLN
56.0000 mg/m2 | Freq: Once | INTRAVENOUS | Status: AC
  Administered 2022-01-07: 80 mg via INTRAVENOUS
  Filled 2022-01-07: qty 30

## 2022-01-07 MED ORDER — ZOLEDRONIC ACID 4 MG/100ML IV SOLN
4.0000 mg | Freq: Once | INTRAVENOUS | Status: AC
  Administered 2022-01-07: 4 mg via INTRAVENOUS
  Filled 2022-01-07: qty 100

## 2022-01-07 MED ORDER — HEPARIN SOD (PORK) LOCK FLUSH 100 UNIT/ML IV SOLN
500.0000 [IU] | Freq: Once | INTRAVENOUS | Status: AC | PRN
  Administered 2022-01-07: 500 [IU]

## 2022-01-07 MED ORDER — SODIUM CHLORIDE 0.9% FLUSH
10.0000 mL | INTRAVENOUS | Status: DC | PRN
  Administered 2022-01-07: 10 mL

## 2022-01-07 NOTE — Progress Notes (Signed)
Per Dede Query, PA, ok to treat with calcium 8.6 mg/dL ?

## 2022-01-07 NOTE — Patient Instructions (Signed)
Megan Olson  Discharge Instructions: ?Thank you for choosing [facility] to provide your oncology and hematology care.  ? ?If you have a lab appointment with the [facility], please go directly to the [facility] and check in at the registration area. ?  ?Wear comfortable clothing and clothing appropriate for easy access to any Portacath or PICC line.  ? ?We strive to give you quality time with your provider. You may need to reschedule your appointment if you arrive late (15 or more minutes).  Arriving late affects you and other patients whose appointments are after yours.  Also, if you miss three or more appointments without notifying the office, you may be dismissed from the clinic at the provider?s discretion.    ?  ?For prescription refill requests, have your pharmacy contact our office and allow 72 hours for refills to be completed.   ? ?Today you received the following chemotherapy and/or immunotherapy agents: Kyprolis (carfilzomib), Zometa (zoledronic acid)    ?  ?To help prevent nausea and vomiting after your treatment, we encourage you to take your nausea medication as directed. ? ?BELOW ARE SYMPTOMS THAT SHOULD BE REPORTED IMMEDIATELY: ?*FEVER GREATER THAN 100.4 F (38 ?C) OR HIGHER ?*CHILLS OR SWEATING ?*NAUSEA AND VOMITING THAT IS NOT CONTROLLED WITH YOUR NAUSEA MEDICATION ?*UNUSUAL SHORTNESS OF BREATH ?*UNUSUAL BRUISING OR BLEEDING ?*URINARY PROBLEMS (pain or burning when urinating, or frequent urination) ?*BOWEL PROBLEMS (unusual diarrhea, constipation, pain near the anus) ?TENDERNESS IN MOUTH AND THROAT WITH OR WITHOUT PRESENCE OF ULCERS (sore throat, sores in mouth, or a toothache) ?UNUSUAL RASH, SWELLING OR PAIN  ?UNUSUAL VAGINAL DISCHARGE OR ITCHING  ? ?Items with * indicate a potential emergency and should be followed up as soon as possible or go to the Emergency Department if any problems should occur. ? ?Please show the CHEMOTHERAPY ALERT CARD or  IMMUNOTHERAPY ALERT CARD at check-in to the Emergency Department and triage nurse. ? ?Should you have questions after your visit or need to cancel or reschedule your appointment, please contact [facility]  Dept: [phone]  and follow the prompts.  Office hours are 8:00 a.m. to 4:30 p.m. Monday - Friday. Please note that voicemails left after 4:00 p.m. may not be returned until the following business day.  We are closed weekends and major holidays. You have access to a nurse at all times for urgent questions. Please call the main number to the clinic Dept: [phone] and follow the prompts. ? ? ?For any non-urgent questions, you may also contact your provider using MyChart. We now offer e-Visits for anyone 58 and older to request care online for non-urgent symptoms. For details visit [email or web address]. ?  ?Also download the MyChart app! Go to the app store, search "MyChart", open the app, select , and log in with your MyChart username and password. ? ?Due to Covid, a mask is required upon entering the hospital/clinic. If you do not have a mask, one will be given to you upon arrival. For doctor visits, patients may have 1 support person aged 74 or older with them. For treatment visits, patients cannot have anyone with them due to current Covid guidelines and our immunocompromised population.  ? ?

## 2022-01-07 NOTE — Progress Notes (Signed)
?Megan Olson   ?[phone] [phone]   ?Clinic Follow up Note  ? ?Patient Care Team: ?Megan Olp, MD as PCP - General (Family Medicine) ?Megan Genera, MD as Consulting Physician (Hematology) ?Megan Oh, MD as Referring Physician (Ophthalmology) ?Megan Calkins, MD as Consulting Physician (Hematology and Oncology) ?Megan Kingdom, MD as Consulting Physician (Internal Medicine) ?Megan Gemma, MD as Consulting Physician (General Surgery) ?Megan Olson, [facility] as Pharmacist (Pharmacist) ? ?DOS .Marland Kitchen04/02/2022 ? ? ?CHIEF COMPLAINT:  ?Follow-up for continued evaluation and management of multiple myeloma ? ?SUMMARY OF ONCOLOGIC HISTORY: ?Oncology History  ?Multiple myeloma not having achieved remission ([place])  ?12/12/2018 Initial Diagnosis  ? Multiple myeloma not having achieved remission [facility]) ?  ?12/20/2018 - 10/04/2019 Chemotherapy  ? The patient had dexamethasone (DECADRON) tablet 20 mg, 20 mg (100 % of original dose 20 mg), Oral, Once, 14 of 14 cycles ?Dose modification: 20 mg (original dose 20 mg, Cycle 1), 10 mg (original dose 20 mg, Cycle 14) ?Administration: 20 mg (12/20/2018), 20 mg (12/27/2018), 20 mg (01/10/2019), 20 mg (01/17/2019), 20 mg (01/31/2019), 20 mg (02/07/2019), 20 mg (03/14/2019), 20 mg (03/21/2019), 20 mg (02/21/2019), 20 mg (02/28/2019), 20 mg (04/04/2019), 20 mg (04/11/2019), 20 mg (04/25/2019), 20 mg (05/02/2019), 20 mg (05/16/2019), 20 mg (05/23/2019), 20 mg (06/06/2019), 20 mg (06/13/2019), 20 mg (06/27/2019), 20 mg (07/04/2019), 20 mg (07/18/2019), 20 mg (07/25/2019), 20 mg (08/08/2019), 20 mg (08/15/2019), 20 mg (08/29/2019), 20 mg (09/05/2019), 10 mg (09/19/2019) ?lenalidomide (REVLIMID) 15 MG capsule, 1 of 1 cycle, Start date: 01/18/2019, End date: 02/21/2019 ?bortezomib SQ (VELCADE) chemo injection 2 mg, 1.3 mg/m2 = 2 mg, Subcutaneous,  Once, 14 of 14 cycles ?Administration: 2 mg (12/20/2018), 2 mg (12/27/2018), 2 mg (12/23/2018), 2 mg (12/30/2018), 2  mg (01/10/2019), 2 mg (01/17/2019), 2 mg (01/13/2019), 2 mg (01/20/2019), 2 mg (01/31/2019), 2 mg (02/07/2019), 2 mg (02/03/2019), 2 mg (02/10/2019), 2 mg (03/14/2019), 2 mg (03/17/2019), 2 mg (03/21/2019), 2 mg (03/24/2019), 2 mg (02/21/2019), 2 mg (02/24/2019), 2 mg (02/28/2019), 2 mg (03/03/2019), 2 mg (04/04/2019), 2 mg (04/06/2019), 2 mg (04/11/2019), 2 mg (04/14/2019), 2 mg (04/25/2019), 2 mg (04/28/2019), 2 mg (05/02/2019), 2 mg (05/05/2019), 2 mg (05/16/2019), 2 mg (05/19/2019), 2 mg (05/23/2019), 2 mg (05/26/2019), 2 mg (06/06/2019), 2 mg (06/09/2019), 2 mg (06/13/2019), 2 mg (06/16/2019), 2 mg (06/27/2019), 2 mg (06/30/2019), 2 mg (07/04/2019), 2 mg (07/07/2019), 2 mg (07/18/2019), 2 mg (07/21/2019), 2 mg (07/25/2019), 2 mg (07/28/2019), 2 mg (08/08/2019), 2 mg (08/11/2019), 2 mg (08/15/2019), 2 mg (08/18/2019), 2 mg (08/29/2019), 2 mg (09/01/2019), 2 mg (09/05/2019), 2 mg (09/08/2019), 2 mg (09/19/2019), 2 mg (10/04/2019) ? ? for chemotherapy treatment.  ? ?  ?Multiple myeloma in relapse [facility])  ?04/16/2021 Initial Diagnosis  ? Multiple myeloma in relapse [facility]) ?  ?04/30/2021 -  Chemotherapy  ? Patient is on Treatment Plan : MYELOMA RELAPSED/REFRACTORY KCd q28d  ?   ? ? ?CURRENT THERAPY: Kyprolis and dexa started on 04/30/2021 ? ?INTERVAL HISTORY: ?Megan Olson returns today for follow up for multiple myeloma. She was last seen by Dr. Aparna Vanderweele Olson on 12/10/2021. She is due for her next cycle of Kyprolis today.  ? ?At today's visit, Megan Olson reports her energy levels are unchanged. She is able to complete her daily activities on her own. After the ED visit on 12/26/2021, she took lactulose for a few days which resolved her constipation. She had several days of soft/loose bowel movements but yesterday she had a normal bowel  movement with formed stools. She takes miralax once a day now. She denies nausea, vomiting or abdominal pain. She denies fevers, chills, sweats, shortness of breath, chest pain, cough, easy bruising, active bleeding, neuropathy or edema. She has no  other complaints. Rest of the ROS is below.  ? ?MEDICAL HISTORY:  ?Past Medical History:  ?Diagnosis Date  ? Anemia   ? Glaucoma   ? HYPERTENSION 03/11/2007  ? Multiple myeloma ([place])   ? OSTEOPENIA 03/11/2007  ? Pre-diabetes   ? Thyroid cancer ([place])   ? Thyroid disease   ? Tubular adenoma of colon 07/2015  ? ? ?SURGICAL HISTORY: ?Past Surgical History:  ?Procedure Laterality Date  ? BONE MARROW BIOPSY    ? multiple  ? BREAST EXCISIONAL BIOPSY Right 2000  ? BREAST LUMPECTOMY  1990  ? benign  ? CATARACT EXTRACTION Bilateral 2018  ? DILATION AND CURETTAGE OF UTERUS    ? bleeding at menopause. No uterine cancer  ? IR IMAGING GUIDED PORT INSERTION  05/16/2021  ? IR RADIOLOGIST EVAL & MGMT  12/13/2018  ? THYROIDECTOMY N/A 08/02/2020  ? Procedure: TOTAL THYROIDECTOMY;  Surgeon: Megan Gemma, MD;  Location: [facility] ORS;  Service: General;  Laterality: N/A;  ? TONSILLECTOMY    ? age 5  ? ?I have reviewed the social history and family history with the patient and they are unchanged from previous note. ? ?ALLERGIES:  is allergic to ace inhibitors, benadryl [diphenhydramine], diamox [acetazolamide], sulfamethoxazole, lenalidomide, and penicillins. ? ?MEDICATIONS:  ?Current Outpatient Medications  ?Medication Sig Dispense Refill  ? acyclovir (ZOVIRAX) 400 MG tablet Take 1 tablet (400 mg total) by mouth 2 (two) times daily. 60 tablet 3  ? ALPHAGAN P 0.1 % SOLN Place 1 drop into both eyes in the morning, at noon, and at bedtime.     ? amLODipine (NORVASC) 10 MG tablet Take 1 tablet (10 mg total) by mouth daily. 90 tablet 3  ? aspirin EC 81 MG tablet Take 81 mg by mouth daily.    ? bimatoprost (LUMIGAN) 0.03 % ophthalmic solution Place 1 drop into both eyes at bedtime.     ? Cholecalciferol (VITAMIN D3) 125 MCG (5000 UT) TABS Take 5,000 Units by mouth daily.    ? Co-Enzyme Q-10 100 MG CAPS Take 100 mg by mouth daily.    ? dexamethasone (DECADRON) 4 MG tablet Take 5 tablets ($RemoveBe'20mg'[id number]$ ) on day 22 of each cycle. Repeat every 28 days.  Take with  breakfast. 12 tablet 3  ? dorzolamide-timolol (COSOPT) 22.3-6.8 MG/ML ophthalmic solution Place 1 drop into both eyes 2 (two) times daily.   11  ? fentaNYL (DURAGESIC) 12 MCG/HR Place 1 patch onto the skin every 3 (three) days. 10 patch 0  ? lactulose (CHRONULAC) 10 GM/15ML solution Take 30 mLs (20 g total) by mouth 2 (two) times daily as needed for moderate constipation. 236 mL 0  ? levothyroxine (SYNTHROID) 88 MCG tablet Take 1 tablet (88 mcg total) by mouth daily before breakfast. 45 tablet 5  ? lidocaine-prilocaine (EMLA) cream Apply 1 application topically as needed. Apply prior to port access. 30 g 0  ? magnesium chloride (SLOW-MAG) 64 MG TBEC SR tablet Take by mouth.    ? Multiple Vitamins-Minerals (MULTIVITAMIN WITH MINERALS) tablet Take 1 tablet by mouth daily. Centrum Silver 50 +    ? Omega-3 1000 MG CAPS Take 1,000 mg by mouth daily.     ? ondansetron (ZOFRAN) 8 MG tablet Take 8 mg by mouth 30 to 60 min prior to  Cytoxan administration then take 8 mg twice daily as needed for nausea and vomiting. 30 tablet 1  ? oxyCODONE-acetaminophen (PERCOCET) 5-325 MG tablet Take 1-2 tablets by mouth every 4 (four) hours as needed for moderate pain or severe pain. 90 tablet 0  ? PFIZER COVID-19 VAC BIVALENT injection     ? polyethylene glycol (MIRALAX) packet Take 17 g by mouth daily. 30 each 1  ? prochlorperazine (COMPAZINE) 10 MG tablet Take 1 tablet (10 mg total) by mouth every 6 (six) hours as needed (Nausea or vomiting). 30 tablet 1  ? Simethicone (GAS-X PO) Take 1 tablet by mouth as needed.    ? vitamin C (ASCORBIC ACID) 500 MG tablet Take 500 mg by mouth 2 (two) times a week.     ? ?No current facility-administered medications for this visit.  ? ? ?PHYSICAL EXAMINATION:. ?Vital signs reviewed ?NAD ?GENERAL:alert, in no acute distress and comfortable ?SKIN: no acute rashes, no significant lesions ?EYES: conjunctiva are pink and non-injected, sclera anicteric ?OROPHARYNX: MMM, no exudates, no oropharyngeal erythema  or ulceration ?NECK: supple, no JVD ?LYMPH:  no palpable lymphadenopathy in the cervical, axillary or inguinal regions ?LUNGS: clear to auscultation b/l with normal respiratory effort ?HEART: regular rate

## 2022-01-08 LAB — KAPPA/LAMBDA LIGHT CHAINS
Kappa free light chain: 5.7 mg/L (ref 3.3–19.4)
Kappa, lambda light chain ratio: 0.08 — ABNORMAL LOW (ref 0.26–1.65)
Lambda free light chains: 69 mg/L — ABNORMAL HIGH (ref 5.7–26.3)

## 2022-01-09 ENCOUNTER — Other Ambulatory Visit: Payer: Self-pay | Admitting: Hematology

## 2022-01-09 LAB — MULTIPLE MYELOMA PANEL, SERUM
Albumin SerPl Elph-Mcnc: 3.6 g/dL (ref 2.9–4.4)
Albumin/Glob SerPl: 1.8 — ABNORMAL HIGH (ref 0.7–1.7)
Alpha 1: 0.2 g/dL (ref 0.0–0.4)
Alpha2 Glob SerPl Elph-Mcnc: 0.6 g/dL (ref 0.4–1.0)
B-Globulin SerPl Elph-Mcnc: 1.1 g/dL (ref 0.7–1.3)
Gamma Glob SerPl Elph-Mcnc: 0.2 g/dL — ABNORMAL LOW (ref 0.4–1.8)
Globulin, Total: 2.1 g/dL — ABNORMAL LOW (ref 2.2–3.9)
IgA: 12 mg/dL — ABNORMAL LOW (ref 64–422)
IgG (Immunoglobin G), Serum: 690 mg/dL (ref 586–1602)
IgM (Immunoglobulin M), Srm: 8 mg/dL — ABNORMAL LOW (ref 26–217)
M Protein SerPl Elph-Mcnc: 0.6 g/dL — ABNORMAL HIGH
Total Protein ELP: 5.7 g/dL — ABNORMAL LOW (ref 6.0–8.5)

## 2022-01-10 ENCOUNTER — Encounter: Payer: Self-pay | Admitting: Hematology

## 2022-01-12 ENCOUNTER — Telehealth: Payer: Self-pay | Admitting: *Deleted

## 2022-01-12 ENCOUNTER — Other Ambulatory Visit: Payer: Self-pay | Admitting: *Deleted

## 2022-01-12 DIAGNOSIS — C9002 Multiple myeloma in relapse: Secondary | ICD-10-CM

## 2022-01-12 NOTE — Telephone Encounter (Signed)
Received tc from patient stating that her constipation has worsened and has not had a complete bm since last Wednesday.  She was prescribed Lactulose from the ER in March.  Advised to avoid taking that if possible due to the fact that this can affect K + more so than other modalities.  ?States that she did have very small hard stool on Thursday following suppository use.  Suggested that she increase miralax to twice a day, along with senna 2 tabs daily.  Advised that she could also use a fleets enema to assist with evacuating hard stool at end of colon.  Encouraged her to drink as much water as possible and avoid zofran if she can help it, due to its constipating properties.  Verbalized understanding and will advise if this does not give her relief.  Will go back to usual dose of laxatives once she has had good results.  ?

## 2022-01-13 ENCOUNTER — Other Ambulatory Visit: Payer: Self-pay

## 2022-01-13 DIAGNOSIS — C9002 Multiple myeloma in relapse: Secondary | ICD-10-CM

## 2022-01-13 MED ORDER — LACTULOSE 10 GM/15ML PO SOLN: 20.0000 g | Freq: Two times a day (BID) | ORAL | 0 refills | Status: DC | PRN

## 2022-01-14 ENCOUNTER — Inpatient Hospital Stay: Payer: Medicare HMO

## 2022-01-14 ENCOUNTER — Other Ambulatory Visit: Payer: Self-pay

## 2022-01-14 VITALS — BP 147/75 | HR 66 | Resp 17 | Wt 98.5 lb

## 2022-01-14 DIAGNOSIS — Z7189 Other specified counseling: Secondary | ICD-10-CM

## 2022-01-14 DIAGNOSIS — C9002 Multiple myeloma in relapse: Secondary | ICD-10-CM

## 2022-01-14 DIAGNOSIS — Z79899 Other long term (current) drug therapy: Secondary | ICD-10-CM | POA: Diagnosis not present

## 2022-01-14 DIAGNOSIS — M8580 Other specified disorders of bone density and structure, unspecified site: Secondary | ICD-10-CM | POA: Diagnosis not present

## 2022-01-14 DIAGNOSIS — C9000 Multiple myeloma not having achieved remission: Secondary | ICD-10-CM | POA: Diagnosis not present

## 2022-01-14 DIAGNOSIS — Z5112 Encounter for antineoplastic immunotherapy: Secondary | ICD-10-CM | POA: Diagnosis not present

## 2022-01-14 DIAGNOSIS — E079 Disorder of thyroid, unspecified: Secondary | ICD-10-CM | POA: Diagnosis not present

## 2022-01-14 LAB — CMP (CANCER CENTER ONLY)
ALT: 10 U/L (ref 0–44)
AST: 11 U/L — ABNORMAL LOW (ref 15–41)
Albumin: 3.8 g/dL (ref 3.5–5.0)
Alkaline Phosphatase: 25 U/L — ABNORMAL LOW (ref 38–126)
Anion gap: 4 — ABNORMAL LOW (ref 5–15)
BUN: 9 mg/dL (ref 8–23)
CO2: 26 mmol/L (ref 22–32)
Calcium: 8.6 mg/dL — ABNORMAL LOW (ref 8.9–10.3)
Chloride: 108 mmol/L (ref 98–111)
Creatinine: 0.49 mg/dL (ref 0.44–1.00)
GFR, Estimated: >60 mL/min (ref >60)
Glucose, Bld: 99 mg/dL (ref 70–99)
Potassium: 4 mmol/L (ref 3.5–5.1)
Sodium: 138 mmol/L (ref 135–145)
Total Bilirubin: 0.5 mg/dL (ref 0.3–1.2)
Total Protein: 6.1 g/dL — ABNORMAL LOW (ref 6.5–8.1)

## 2022-01-14 LAB — CBC WITH DIFFERENTIAL (CANCER CENTER ONLY)
Abs Immature Granulocytes: 0.01 10*3/uL (ref 0.00–0.07)
Basophils Absolute: 0 10*3/uL (ref 0.0–0.1)
Basophils Relative: 1 %
Eosinophils Absolute: 0.1 10*3/uL (ref 0.0–0.5)
Eosinophils Relative: 2 %
HCT: 29.5 % — ABNORMAL LOW (ref 36.0–46.0)
Hemoglobin: 9.9 g/dL — ABNORMAL LOW (ref 12.0–15.0)
Immature Granulocytes: 0 %
Lymphocytes Relative: 10 %
Lymphs Abs: 0.3 10*3/uL — ABNORMAL LOW (ref 0.7–4.0)
MCH: 36 pg — ABNORMAL HIGH (ref 26.0–34.0)
MCHC: 33.6 g/dL (ref 30.0–36.0)
MCV: 107.3 fL — ABNORMAL HIGH (ref 80.0–100.0)
Monocytes Absolute: 0.6 10*3/uL (ref 0.1–1.0)
Monocytes Relative: 20 %
Neutro Abs: 1.9 10*3/uL (ref 1.7–7.7)
Neutrophils Relative %: 67 %
Platelet Count: 152 10*3/uL (ref 150–400)
RBC: 2.75 MIL/uL — ABNORMAL LOW (ref 3.87–5.11)
RDW: 13.5 % (ref 11.5–15.5)
WBC Count: 2.9 10*3/uL — ABNORMAL LOW (ref 4.0–10.5)
nRBC: 0 % (ref 0.0–0.2)

## 2022-01-14 MED ORDER — ACETAMINOPHEN 500 MG PO TABS
1000.0000 mg | ORAL_TABLET | Freq: Once | ORAL | Status: AC
  Administered 2022-01-14: 1000 mg via ORAL
  Filled 2022-01-14: qty 2

## 2022-01-14 MED ORDER — SODIUM CHLORIDE 0.9 % IV SOLN: Freq: Once | INTRAVENOUS | Status: DC

## 2022-01-14 MED ORDER — DEXAMETHASONE SODIUM PHOSPHATE 10 MG/ML IJ SOLN
4.0000 mg | Freq: Once | INTRAMUSCULAR | Status: AC
  Administered 2022-01-14: 4 mg via INTRAVENOUS
  Filled 2022-01-14: qty 1

## 2022-01-14 MED ORDER — SODIUM CHLORIDE 0.9% FLUSH
10.0000 mL | INTRAVENOUS | Status: DC | PRN
  Administered 2022-01-14: 10 mL

## 2022-01-14 MED ORDER — HEPARIN SOD (PORK) LOCK FLUSH 100 UNIT/ML IV SOLN
500.0000 [IU] | Freq: Once | INTRAVENOUS | Status: AC | PRN
  Administered 2022-01-14: 500 [IU]

## 2022-01-14 MED ORDER — ONDANSETRON HCL 4 MG/2ML IJ SOLN
4.0000 mg | Freq: Once | INTRAMUSCULAR | Status: AC
  Administered 2022-01-14: 4 mg via INTRAVENOUS
  Filled 2022-01-14: qty 2

## 2022-01-14 MED ORDER — SODIUM CHLORIDE 0.9 % IV SOLN: Freq: Once | INTRAVENOUS | Status: AC

## 2022-01-14 MED ORDER — DEXTROSE 5 % IV SOLN
56.0000 mg/m2 | Freq: Once | INTRAVENOUS | Status: AC
  Administered 2022-01-14: 80 mg via INTRAVENOUS
  Filled 2022-01-14: qty 30

## 2022-01-14 NOTE — Patient Instructions (Signed)
[facility] MEDICAL [facility]COLOGY  Discharge Instructions: Thank you for choosing [facility] to provide your oncology and hematology care.   If you have a lab appointment with the Cancer Center, please go directly to the Cancer Center and check in at the registration area.   Wear comfortable clothing and clothing appropriate for easy access to any Portacath or PICC line.   We strive to give you quality time with your provider. You may need to reschedule your appointment if you arrive late (15 or more minutes).  Arriving late affects you and other patients whose appointments are after yours.  Also, if you miss three or more appointments without notifying the office, you may be dismissed from the clinic at the provider's discretion.      For prescription refill requests, have your pharmacy contact our office and allow 72 hours for refills to be completed.    Today you received the following chemotherapy and/or immunotherapy agents: Kyprolis    To help prevent nausea and vomiting after your treatment, we encourage you to take your nausea medication as directed.  BELOW ARE SYMPTOMS THAT SHOULD BE REPORTED IMMEDIATELY: . *FEVER GREATER THAN 100.4 F (38 C) OR HIGHER . *CHILLS OR SWEATING . *NAUSEA AND VOMITING THAT IS NOT CONTROLLED WITH YOUR NAUSEA MEDICATION . *UNUSUAL SHORTNESS OF BREATH . *UNUSUAL BRUISING OR BLEEDING . *URINARY PROBLEMS (pain or burning when urinating, or frequent urination) . *BOWEL PROBLEMS (unusual diarrhea, constipation, pain near the anus) . TENDERNESS IN MOUTH AND THROAT WITH OR WITHOUT PRESENCE OF ULCERS (sore throat, sores in mouth, or a toothache) . UNUSUAL RASH, SWELLING OR PAIN  . UNUSUAL VAGINAL DISCHARGE OR ITCHING   Items with * indicate a potential emergency and should be followed up as soon as possible or go to the Emergency Department if any problems should occur.  Please show the CHEMOTHERAPY ALERT CARD or IMMUNOTHERAPY ALERT  CARD at check-in to the Emergency Department and triage nurse.  Should you have questions after your visit or need to cancel or reschedule your appointment, please contact [facility]  Dept: [phone]  and follow the prompts.  Office hours are 8:00 a.m. to 4:30 p.m. Monday - Friday. Please note that voicemails left after 4:00 p.m. may not be returned until the following business day.  We are closed weekends and major holidays. You have access to a nurse at all times for urgent questions. Please call the main number to the clinic Dept: [phone] and follow the prompts.   For any non-urgent questions, you may also contact your provider using MyChart. We now offer e-Visits for anyone 18 and older to request care online for non-urgent symptoms. For details visit [email or web address].   Also download the MyChart app! Go to the app store, search "MyChart", open the app, select [facility], and log in with your MyChart username and password.  Due to Covid, a mask is required upon entering the hospital/clinic. If you do not have a mask, one will be given to you upon arrival. For doctor visits, patients may have 1 support person aged 18 or older with them. For treatment visits, patients cannot have anyone with them due to current Covid guidelines and our immunocompromised population.   

## 2022-01-20 ENCOUNTER — Other Ambulatory Visit: Payer: Self-pay

## 2022-01-20 DIAGNOSIS — C9002 Multiple myeloma in relapse: Secondary | ICD-10-CM

## 2022-01-20 DIAGNOSIS — C9000 Multiple myeloma not having achieved remission: Secondary | ICD-10-CM

## 2022-01-20 MED ORDER — FENTANYL 12 MCG/HR TD PT72: 1.0000 | MEDICATED_PATCH | TRANSDERMAL | 0 refills | Status: DC

## 2022-01-21 ENCOUNTER — Other Ambulatory Visit: Payer: Self-pay

## 2022-01-21 ENCOUNTER — Inpatient Hospital Stay: Payer: Medicare HMO

## 2022-01-21 ENCOUNTER — Encounter: Payer: Self-pay | Admitting: Hematology

## 2022-01-21 VITALS — BP 172/85 | HR 62 | Temp 98.2°F | Resp 16 | Wt 99.0 lb

## 2022-01-21 DIAGNOSIS — C9002 Multiple myeloma in relapse: Secondary | ICD-10-CM

## 2022-01-21 DIAGNOSIS — M8580 Other specified disorders of bone density and structure, unspecified site: Secondary | ICD-10-CM | POA: Diagnosis not present

## 2022-01-21 DIAGNOSIS — C9000 Multiple myeloma not having achieved remission: Secondary | ICD-10-CM | POA: Diagnosis not present

## 2022-01-21 DIAGNOSIS — Z5112 Encounter for antineoplastic immunotherapy: Secondary | ICD-10-CM | POA: Diagnosis not present

## 2022-01-21 DIAGNOSIS — E079 Disorder of thyroid, unspecified: Secondary | ICD-10-CM | POA: Diagnosis not present

## 2022-01-21 DIAGNOSIS — Z7189 Other specified counseling: Secondary | ICD-10-CM

## 2022-01-21 DIAGNOSIS — Z95828 Presence of other vascular implants and grafts: Secondary | ICD-10-CM

## 2022-01-21 DIAGNOSIS — Z79899 Other long term (current) drug therapy: Secondary | ICD-10-CM | POA: Diagnosis not present

## 2022-01-21 LAB — CMP (CANCER CENTER ONLY)
ALT: 10 U/L (ref 0–44)
AST: 12 U/L — ABNORMAL LOW (ref 15–41)
Albumin: 3.9 g/dL (ref 3.5–5.0)
Alkaline Phosphatase: 25 U/L — ABNORMAL LOW (ref 38–126)
Anion gap: 5 (ref 5–15)
BUN: 12 mg/dL (ref 8–23)
CO2: 26 mmol/L (ref 22–32)
Calcium: 8.8 mg/dL — ABNORMAL LOW (ref 8.9–10.3)
Chloride: 109 mmol/L (ref 98–111)
Creatinine: 0.48 mg/dL (ref 0.44–1.00)
GFR, Estimated: >60 mL/min (ref >60)
Glucose, Bld: 107 mg/dL — ABNORMAL HIGH (ref 70–99)
Potassium: 4 mmol/L (ref 3.5–5.1)
Sodium: 140 mmol/L (ref 135–145)
Total Bilirubin: 0.4 mg/dL (ref 0.3–1.2)
Total Protein: 6.2 g/dL — ABNORMAL LOW (ref 6.5–8.1)

## 2022-01-21 LAB — CBC WITH DIFFERENTIAL (CANCER CENTER ONLY)
Abs Immature Granulocytes: 0.03 10*3/uL (ref 0.00–0.07)
Basophils Absolute: 0 10*3/uL (ref 0.0–0.1)
Basophils Relative: 1 %
Eosinophils Absolute: 0.1 10*3/uL (ref 0.0–0.5)
Eosinophils Relative: 3 %
HCT: 30.2 % — ABNORMAL LOW (ref 36.0–46.0)
Hemoglobin: 9.8 g/dL — ABNORMAL LOW (ref 12.0–15.0)
Immature Granulocytes: 1 %
Lymphocytes Relative: 10 %
Lymphs Abs: 0.3 10*3/uL — ABNORMAL LOW (ref 0.7–4.0)
MCH: 35.1 pg — ABNORMAL HIGH (ref 26.0–34.0)
MCHC: 32.5 g/dL (ref 30.0–36.0)
MCV: 108.2 fL — ABNORMAL HIGH (ref 80.0–100.0)
Monocytes Absolute: 0.5 10*3/uL (ref 0.1–1.0)
Monocytes Relative: 17 %
Neutro Abs: 2.1 10*3/uL (ref 1.7–7.7)
Neutrophils Relative %: 68 %
Platelet Count: 279 10*3/uL (ref 150–400)
RBC: 2.79 MIL/uL — ABNORMAL LOW (ref 3.87–5.11)
RDW: 13.2 % (ref 11.5–15.5)
WBC Count: 3 10*3/uL — ABNORMAL LOW (ref 4.0–10.5)
nRBC: 0 % (ref 0.0–0.2)

## 2022-01-21 MED ORDER — HEPARIN SOD (PORK) LOCK FLUSH 100 UNIT/ML IV SOLN
500.0000 [IU] | Freq: Once | INTRAVENOUS | Status: AC | PRN
  Administered 2022-01-21: 500 [IU]

## 2022-01-21 MED ORDER — DEXAMETHASONE SODIUM PHOSPHATE 10 MG/ML IJ SOLN
4.0000 mg | Freq: Once | INTRAMUSCULAR | Status: AC
  Administered 2022-01-21: 4 mg via INTRAVENOUS
  Filled 2022-01-21: qty 1

## 2022-01-21 MED ORDER — SODIUM CHLORIDE 0.9% FLUSH
10.0000 mL | INTRAVENOUS | Status: DC | PRN
  Administered 2022-01-21: 10 mL

## 2022-01-21 MED ORDER — SODIUM CHLORIDE 0.9% FLUSH
10.0000 mL | Freq: Once | INTRAVENOUS | Status: AC
  Administered 2022-01-21: 10 mL

## 2022-01-21 MED ORDER — SODIUM CHLORIDE 0.9 % IV SOLN: Freq: Once | INTRAVENOUS | Status: AC

## 2022-01-21 MED ORDER — DEXTROSE 5 % IV SOLN
56.0000 mg/m2 | Freq: Once | INTRAVENOUS | Status: AC
  Administered 2022-01-21: 80 mg via INTRAVENOUS
  Filled 2022-01-21: qty 30

## 2022-01-21 MED ORDER — ACETAMINOPHEN 500 MG PO TABS
1000.0000 mg | ORAL_TABLET | Freq: Once | ORAL | Status: AC
  Administered 2022-01-21: 1000 mg via ORAL
  Filled 2022-01-21: qty 2

## 2022-01-21 MED ORDER — ONDANSETRON HCL 4 MG/2ML IJ SOLN
4.0000 mg | Freq: Once | INTRAMUSCULAR | Status: AC
  Administered 2022-01-21: 4 mg via INTRAVENOUS
  Filled 2022-01-21: qty 2

## 2022-01-21 NOTE — Patient Instructions (Signed)
[facility]  Discharge Instructions: Thank you for choosing [facility] to provide your oncology and hematology care.   If you have a lab appointment with the Cancer Center, please go directly to the Cancer Center and check in at the registration area.   Wear comfortable clothing and clothing appropriate for easy access to any Portacath or PICC line.   We strive to give you quality time with your provider. You may need to reschedule your appointment if you arrive late (15 or more minutes).  Arriving late affects you and other patients whose appointments are after yours.  Also, if you miss three or more appointments without notifying the office, you may be dismissed from the clinic at the provider's discretion.      For prescription refill requests, have your pharmacy contact our office and allow 72 hours for refills to be completed.    Today you received the following chemotherapy and/or immunotherapy agents: Kyprolis    To help prevent nausea and vomiting after your treatment, we encourage you to take your nausea medication as directed.  BELOW ARE SYMPTOMS THAT SHOULD BE REPORTED IMMEDIATELY: . *FEVER GREATER THAN 100.4 F (38 C) OR HIGHER . *CHILLS OR SWEATING . *NAUSEA AND VOMITING THAT IS NOT CONTROLLED WITH YOUR NAUSEA MEDICATION . *UNUSUAL SHORTNESS OF BREATH . *UNUSUAL BRUISING OR BLEEDING . *URINARY PROBLEMS (pain or burning when urinating, or frequent urination) . *BOWEL PROBLEMS (unusual diarrhea, constipation, pain near the anus) . TENDERNESS IN MOUTH AND THROAT WITH OR WITHOUT PRESENCE OF ULCERS (sore throat, sores in mouth, or a toothache) . UNUSUAL RASH, SWELLING OR PAIN  . UNUSUAL VAGINAL DISCHARGE OR ITCHING   Items with * indicate a potential emergency and should be followed up as soon as possible or go to the Emergency Department if any problems should occur.  Please show the CHEMOTHERAPY ALERT CARD or IMMUNOTHERAPY ALERT  CARD at check-in to the Emergency Department and triage nurse.  Should you have questions after your visit or need to cancel or reschedule your appointment, please contact [facility]  Dept: [phone]  and follow the prompts.  Office hours are 8:00 a.m. to 4:30 p.m. Monday - Friday. Please note that voicemails left after 4:00 p.m. may not be returned until the following business day.  We are closed weekends and major holidays. You have access to a nurse at all times for urgent questions. Please call the main number to the clinic Dept: [phone] and follow the prompts.   For any non-urgent questions, you may also contact your provider using MyChart. We now offer e-Visits for anyone 18 and older to request care online for non-urgent symptoms. For details visit [email or web address].   Also download the MyChart app! Go to the app store, search "MyChart", open the app, select [facility], and log in with your MyChart username and password.  Due to Covid, a mask is required upon entering the hospital/clinic. If you do not have a mask, one will be given to you upon arrival. For doctor visits, patients may have 1 support person aged 18 or older with them. For treatment visits, patients cannot have anyone with them due to current Covid guidelines and our immunocompromised population.   

## 2022-01-28 ENCOUNTER — Ambulatory Visit (INDEPENDENT_AMBULATORY_CARE_PROVIDER_SITE_OTHER): Payer: Medicare HMO | Admitting: Internal Medicine

## 2022-01-28 ENCOUNTER — Encounter: Payer: Self-pay | Admitting: Internal Medicine

## 2022-01-28 VITALS — BP 160/100 | HR 76 | Ht 60.0 in | Wt 97.2 lb

## 2022-01-28 DIAGNOSIS — R7303 Prediabetes: Secondary | ICD-10-CM

## 2022-01-28 DIAGNOSIS — E890 Postprocedural hypothyroidism: Secondary | ICD-10-CM

## 2022-01-28 DIAGNOSIS — C73 Malignant neoplasm of thyroid gland: Secondary | ICD-10-CM

## 2022-01-28 LAB — HEMOGLOBIN A1C: Hgb A1c MFr Bld: 5 % (ref 4.6–6.5)

## 2022-01-28 LAB — T4, FREE: Free T4: 0.95 ng/dL (ref 0.60–1.60)

## 2022-01-28 LAB — TSH: TSH: 1.5 u[IU]/mL (ref 0.35–5.50)

## 2022-01-28 NOTE — Progress Notes (Addendum)
Patient ID: Megan Olson, female   DOB: July 24, 1941, 81 y.o.   MRN: [id number]   This visit occurred during the SARS-CoV-2 public health emergency.  Safety protocols were in place, including screening questions prior to the visit, additional usage of staff PPE, and extensive cleaning of exam room while observing appropriate contact time as indicated for disinfecting solutions.   HPI  Megan Olson is a 81 y.o.-year-old very nice female, initially referred by her PCP, Dr. Yong Olson, returning for follow-up for newly diagnosed thyroid cancer and prediabetes.  Last visit 6  months ago.  Interim history: She has 4 week cycles of ChTx with iv Dex - 4 mg now (lower dose).  Last dexamethasone dose was a week ago. She denies increased fatigue but had a 12-lg weight loss.  She is trying to eat more food to prevent further weight loss. She recently had severe constipation 12/2021 2/2 Opiates. She went home with Lactulose. Constipation resolved.   Papillary thyroid cancer:  Reviewed history: Patient was noticed to have a FDG-avid thyroid nodule on recent PET/CT scans obtained for patient's multiple myeloma.  A thyroid ultrasound that showed 2 nodules meeting criteria for biopsy.  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  She had total thyroidectomy by Dr. Harlow Olson.  PET/CT (07/17/2019): CHEST: Persistent hypermetabolism in a partially calcified 1.8 cm left thyroid nodule, SUV max 4.5. No hypermetabolic mediastinal, hilar or axillary lymph nodes.  PET/CT (09/14/2019): HEAD/NECK: Calcified 1.5 cm in diameter left thyroid nodule maximum SUV 6.7, previously 7.7.  Thyroid U/S (06/24/2020): Parenchymal Echotexture: Mildly heterogenous Isthmus: Normal in size measuring 0.3 cm in diameter Right lobe: Atrophic in size measuring 3.7 x 1.3 x 1.5 cm Left lobe: Atrophic in size measuring 3.4 x 1.6 x 1.8 cm _________________________________________________________   Estimated total number of nodules >/=  1 cm: 2   Number of spongiform nodules >/=  2 cm not described below (TR1): 0   Number of mixed cystic and solid nodules >/= 1.5 cm not described below (TR2): 0  _________________________________________________________   Scattered punctate (sub 5 mm) anechoic cysts and hypoechoic nodules within the right lobe of the thyroid, several which contain internal echogenic foci with ring down artifact compatible with benign colloid. None of these punctate nodules meet imaging criteria to recommend percutaneous sampling or continued dedicated follow-up.  ________________________________________________________   Nodule # 1: Location: Left; Mid - this nodule appears hypermetabolic on preceding PET-CT Maximum size: 2.0 cm; Other 2 dimensions: 1.8 x 1.7 cm Composition: cannot determine (2) Echogenicity: cannot determine (1) Shape: not taller-than-wide (0) Margins: lobulated/irregular (2) Echogenic foci: peripheral calcifications (2) Additional echogenic foci 1:  macrocalcifications (1)   **Given size (>/= 1.0 cm) and appearance, fine needle aspiration of this highly suspicious nodule should be considered based on TI-RADS criteria.  _________________________________________________________   Nodule # 2: Location: Left; mid - this nodule appears hypermetabolic on preceding PET-CT Maximum size: 1.2 cm; Other 2 dimensions: 0.9 x 0.7 cm Composition: solid/almost completely solid (2) Echogenicity: hypoechoic (2) Shape: not taller-than-wide (0) Margins: lobulated/irregular (2) Echogenic foci: none (0) *Given size (>/= 1 - 1.4 cm) and appearance, a follow-up ultrasound in 1 year should be considered based on TI-RADS criteria. However - see below: Bx recommended.  _________________________________________________________   There is an additional punctate (approximately 0.5 cm) anechoic cyst within left lobe of the thyroid which does not meet criteria to recommend percutaneous sampling or  continued dedicated follow-up.   IMPRESSION: 1. Findings suggestive of multinodular goiter. 2.  Nodule #1,correlating with one of hypermetabolic nodules seen on preceding PET-CT, meets imaging criteria to recommend percutaneous sampling as clinically indicated. 3. Nodule #2 meets TIRADS criteria to only recommend a 1 year follow-up, though as this nodule also appears hypermetabolic on preceding PET-CT, percutaneous sampling is also recommended for this nodule as hypermetabolic nodules have a higher rate of harboring malignancy. 4. None of the remaining thyroid nodules/cysts meet imaging criteria to recommend percutaneous sampling or continued dedicated follow-up.  Biopsy of the above 2 nodules.  FNAs (07/10/2020): Specimen Submitted:  A. THYROID,LEFT MID #2, FINE NEEDLE ASPIRATION:  FINAL MICROSCOPIC DIAGNOSIS:  - Findings consistent with papillary carcinoma (Bethesda category VI)  SPECIMEN ADEQUACY:  Satisfactory for evaluation   Specimen Submitted:  A. THYROID,LEFT MID #1, FINE NEEDLE ASPIRATION:  FINAL MICROSCOPIC DIAGNOSIS:  - Findings consistent with papillary carcinoma (Bethesda category VI)  SPECIMEN ADEQUACY:  Satisfactory for evaluation  Total thyroidectomy (08/02/2020): A. THYROID, TOTAL THYROIDECTOMY:  - Papillary thyroid carcinoma, classical variant, at least 3 cm  - Portion of the tumor shows hyaline fibrosis and calcification  - Anterior surface resection margin is involved by tumor  - Definite evidence of extrathyroidal extension is not identified  - Negative for lymphovascular invasion   THYROID GLAND:   Procedure: Total thyroidectomy  Tumor Focality: Unifocal  Tumor Site: Mid and inferior pole of left lobe  Tumor Size: At least 3 cm  Histologic Type: Papillary thyroid carcinoma, classical variant  Margins: The anterior surface margin is involved by carcinoma  Angioinvasion: Not identified  Lymphatic Invasion: Not identified  Extrathyroidal extension: Not  identified  Regional Lymph Nodes: No lymph nodes submitted or found  Pathologic Stage Classification (pTNM, AJCC 8th Edition): pT2, pN not  assigned (no nodes submitted or found)  Comment(s): Though 2 separate lesions were described grossly, they  appeared to be part of a single tumor with prominent fibrosis and calcification of a portion of the tumor.    Lab Results  Component Value Date   THYROGLB 1.0 (L) 06/30/2021   THYROGLB 0.4 (L) 09/05/2020   Lab Results  Component Value Date   THGAB <1 06/30/2021   THGAB <1 09/05/2020   Pt denies: - feeling nodules in neck - hoarseness - dysphagia - choking - SOB with lying down  She is on levothyroxine 88 mcg daily: - in am - fasting - at least 1h  from b'fast - off calcium - no iron - + multivitamins at lunchtime - no PPIs - not on Biotin - + slow Mag with calcium at dinner  Reviewed her TFTs: Lab Results  Component Value Date   TSH 2.74 08/11/2021   TSH 7.50 (H) 06/30/2021   TSH 3.17 03/17/2021   TSH 1.96 10/30/2020   TSH 0.56 09/05/2020   TSH 1.006 09/19/2019   TSH 1.84 03/25/2016   TSH 1.47 03/22/2015   TSH 1.58 03/19/2014   TSH 1.79 03/08/2013   FREET4 0.77 06/30/2021   FREET4 0.86 03/17/2021   FREET4 0.90 10/30/2020   FREET4 1.04 09/05/2020   FREET4 1.10 09/19/2019    She has family history of thyroid disease: Hashimoto's thyroidism in sister. No FH of thyroid cancer. No h/o radiation tx to head or neck.  No herbal supplements. No Biotin use.   Prediabetes:  Reviewed HbA1c levels: Lab Results  Component Value Date   HGBA1C 5.9 (A) 01/13/2021   HGBA1C 5.9 (A) 06/18/2020   HGBA1C 5.8 (H) 01/26/2020   HGBA1C 5.6 10/10/2018   HGBA1C 6.3 04/04/2018   HGBA1C  5.9 09/17/2017   HGBA1C 6.2 03/26/2017   HGBA1C 5.9 03/22/2015   She does not check blood sugars at home.  + HL: Lab Results  Component Value Date   CHOL 220 (H) 01/26/2020   HDL 68 01/26/2020   LDLCALC 132 (H) 01/26/2020   TRIG 97 01/26/2020    CHOLHDL 3.2 01/26/2020  She is not on a statin.  Review most recent kidney function: Lab Results  Component Value Date   BUN 12 01/21/2022   Lab Results  Component Value Date   CREATININE 0.48 01/21/2022  She is not on ACE inhibitor or ARB.  She takes an aspirin 81 mg daily.  Last eye exam: [facility] - 08/25/2021: No DR  She has a history of multiple myeloma, which is now in remission.  Previously on dexamethasone with chemotherapy, not now. Also, history of multilevel thoracic vertebral fractures.  ROS: + see HPI  I reviewed pt's medications, allergies, PMH, social hx, family hx, and changes were documented in the history of present illness. Otherwise, unchanged from my initial visit note.  Past Medical History:  Diagnosis Date   Anemia    Glaucoma    HYPERTENSION 03/11/2007   Multiple myeloma ([place])    OSTEOPENIA 03/11/2007   Pre-diabetes    Thyroid cancer ([place])    Thyroid disease    Tubular adenoma of colon 07/2015   Past Surgical History:  Procedure Laterality Date   BONE MARROW BIOPSY     multiple   BREAST EXCISIONAL BIOPSY Right 2000   BREAST LUMPECTOMY  1990   benign   CATARACT EXTRACTION Bilateral 2018   DILATION AND CURETTAGE OF UTERUS     bleeding at menopause. No uterine cancer   IR IMAGING GUIDED PORT INSERTION  05/16/2021   IR RADIOLOGIST EVAL & MGMT  12/13/2018   THYROIDECTOMY N/A 08/02/2020   Procedure: TOTAL THYROIDECTOMY;  Surgeon: [name], MD;  Location: [facility];  Service: General;  Laterality: N/A;   TONSILLECTOMY     age 75   Social History   Socioeconomic History   Marital status: Married    Spouse name: Not on file   Number of children: 1   Years of education: Not on file   Highest education level: Not on file  Occupational History   Occupation: Retired  Tobacco Use   Smoking status: Former    Packs/day: 0.50    Years: 7.00    Pack years: 3.50    Types: Cigarettes    Quit date: 01/04/1961    Years since quitting: 61.1    Smokeless tobacco: Never  Vaping Use   Vaping Use: Never used  Substance and Sexual Activity   Alcohol use: Not Currently    Alcohol/week: 1.0 standard drink    Types: 1 Standard drinks or equivalent per week    Comment: occas    Drug use: No   Sexual activity: Not Currently  Other Topics Concern   Not on file  Social History Narrative   Married. Lives with husband (patient of Dr. Yong Olson). 1 son. No grandkids. 1 granddog.       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, swing dancing, dinner, read      Social Determinants of Radio broadcast assistant Strain: Low Risk    Difficulty of Paying Living Expenses: Not hard at all  Food Insecurity: No Food Insecurity   Worried About [employer] of  Food in the Last Year: Never true   [place] in the Last Year: Never true  Transportation Needs: No Transportation Needs   Lack of Transportation (Medical): No   Lack of Transportation (Non-Medical): No  Physical Activity: Inactive   Days of Exercise per Week: 0 days   Minutes of Exercise per Session: 0 min  Stress: No Stress Concern Present   Feeling of Stress : Only a little  Social Connections: Moderately Integrated   Frequency of Communication with Friends and Family: More than three times a week   Frequency of Social Gatherings with Friends and Family: More than three times a week   Attends Religious Services: More than 4 times per year   Active Member of [employer] or Organizations: No   Attends Archivist Meetings: Never   Marital Status: Married  Human resources officer Violence: Not At Risk   Fear of Current or Ex-Partner: No   Emotionally Abused: No   Physically Abused: No   Sexually Abused: No   Current Outpatient Medications on File Prior to Visit  Medication Sig Dispense Refill   acyclovir (ZOVIRAX) 400 MG tablet Take 1 tablet (400 mg total) by mouth 2 (two) times daily. 60 tablet  3   ALPHAGAN P 0.1 % SOLN Place 1 drop into both eyes in the morning, at noon, and at bedtime.      amLODipine (NORVASC) 10 MG tablet Take 1 tablet (10 mg total) by mouth daily. 90 tablet 3   aspirin EC 81 MG tablet Take 81 mg by mouth daily.     bimatoprost (LUMIGAN) 0.03 % ophthalmic solution Place 1 drop into both eyes at bedtime.      Cholecalciferol (VITAMIN D3) 125 MCG (5000 UT) TABS Take 5,000 Units by mouth daily.     Co-Enzyme Q-10 100 MG CAPS Take 100 mg by mouth daily.     dexamethasone (DECADRON) 4 MG tablet Take 5 tablets ($RemoveBe'20mg'ALKRmTyAO$ ) on day 22 of each cycle. Repeat every 28 days.  Take with breakfast. 12 tablet 3   dorzolamide-timolol (COSOPT) 22.3-6.8 MG/ML ophthalmic solution Place 1 drop into both eyes 2 (two) times daily.   11   fentaNYL (DURAGESIC) 12 MCG/HR Place 1 patch onto the skin every 3 (three) days. 10 patch 0   lactulose (CHRONULAC) 10 GM/15ML solution Take 30 mLs (20 g total) by mouth 2 (two) times daily as needed for moderate constipation. 236 mL 0   levothyroxine (SYNTHROID) 88 MCG tablet Take 1 tablet (88 mcg total) by mouth daily before breakfast. 45 tablet 5   lidocaine-prilocaine (EMLA) cream Apply 1 application topically as needed. Apply prior to port access. 30 g 0   LUMIGAN 0.01 % SOLN 1 drop at bedtime.     magnesium chloride (SLOW-MAG) 64 MG TBEC SR tablet Take by mouth.     Multiple Vitamins-Minerals (MULTIVITAMIN WITH MINERALS) tablet Take 1 tablet by mouth daily. Centrum Silver 50 +     Omega-3 1000 MG CAPS Take 1,000 mg by mouth daily.      ondansetron (ZOFRAN) 8 MG tablet Take 8 mg by mouth 30 to 60 min prior to Cytoxan administration then take 8 mg twice daily as needed for nausea and vomiting. 30 tablet 1   oxyCODONE-acetaminophen (PERCOCET) 5-325 MG tablet Take 1-2 tablets by mouth every 4 (four) hours as needed for moderate pain or severe pain. 90 tablet 0   PFIZER COVID-19 VAC BIVALENT injection      polyethylene glycol (MIRALAX) packet Take 17  g by  mouth daily. 30 each 1   prochlorperazine (COMPAZINE) 10 MG tablet Take 1 tablet (10 mg total) by mouth every 6 (six) hours as needed (Nausea or vomiting). 30 tablet 1   Simethicone (GAS-X PO) Take 1 tablet by mouth as needed.     vitamin C (ASCORBIC ACID) 500 MG tablet Take 500 mg by mouth 2 (two) times a week.      No current facility-administered medications on file prior to visit.   Allergies  Allergen Reactions   Ace Inhibitors Cough   Benadryl [Diphenhydramine] Other (See Comments)    Heart races   Diamox [Acetazolamide]     Hypotensive event at ophthalmologist   Sulfamethoxazole     REACTION: rash   Lenalidomide Rash   Penicillins Rash    REACTION: rash Did it involve swelling of the face/tongue/throat, SOB, or low BP? Yes Did it involve sudden or severe rash/hives, skin peeling, or any reaction on the inside of your mouth or nose? No Did you need to seek medical attention at a hospital or doctor's office? No When did it last happen?      more than 10 years ago If all above answers are "NO", may proceed with cephalosporin use.    Family History  Problem Relation Age of Onset   Heart disease Mother        CHF mother died 66   Arthritis Mother    Glaucoma Mother        sister as well   Alcohol abuse Father    Suicidality Father    Heart disease Sister        aortic valve replacement   Lung cancer Sister        smoker   Hyperlipidemia Brother    Hypertension Brother    COPD Brother    Colon polyps Brother    Arthritis Sister    Hypertension Sister    Glaucoma Sister    Hashimoto's thyroiditis Sister    Colon polyps Sister    Hypertension Son    Stroke Maternal Grandmother    Cystic fibrosis Niece    Colon cancer Neg Hx     PE: BP (!) 160/100 (BP Location: Left Arm, Patient Position: Sitting, Cuff Size: Small)   Pulse 76   Ht 5' (1.524 m)   Wt 97 lb 3.2 oz (44.1 kg)   SpO2 96%   BMI 18.98 kg/m  Wt Readings from Last 3 Encounters:  01/28/22 97 lb 3.2  oz (44.1 kg)  01/21/22 99 lb (44.9 kg)  01/14/22 98 lb 8 oz (44.7 kg)   Constitutional: thin, in NAD Eyes: PERRLA, EOMI, no exophthalmos ENT: moist mucous membranes, no neck masses, thyroidectomy scar healed, no cervical lymphadenopathy Cardiovascular: RRR, No MRG Respiratory: CTA B Musculoskeletal: no deformities, strength intact in all 4 Skin: moist, warm, no rashes Neurological: no tremor with outstretched hands, DTR normal in all 4  ASSESSMENT: 1.  Papillary thyroid cancer, classic variant  2.  Postsurgical hypothyroidism  3.  Prediabetes  PLAN: 1. Thyroid cancer - papillary -Patient with history of clinically stage I TNM thyroid cancer, with lower clinical risk due to tumor size smaller than 4 cm, no lymphovascular invasion, no atypical features, except for few calcifications and hyalinization, possibly associated with slightly higher risk, and without extrathyroidal extension.  -We discussed that PTC is a cancer with good prognosis, and her life expectancy and quality of life were unlikely to be affected by the cancer -Since her cancer was low-intermediate  risk, RAI treatment for postop thyroid remnant ablation was not mandatory.  We decided to follow her without RAI treatment, especially as she was on the treatment plan for multiple myeloma.  We did discuss that if we decide to perform RAI treatment later in the disease, this will likely not affect her prognosis. -Since she did not have RAI treatment, her thyroglobulin levels may not become undetectable.  At last visit, her level was detectable, at 1.0, higher than previous checks, when it was 0.4 (09/2020).  ATA antibodies were undetectable. -She recently had a PET scan (12/04/2021) which did not show any increased uptake in the neck.  We reviewed this report at today's visit. -However, at today's visit, we decided to check another neck ultrasound which would be the first after her surgery.  We discussed about why this is needed: To  monitor the amount of scar tissue developed after the surgery. -I we will recheck her thyroglobulin level today -I will see the patient in 1 year  2.  Patient with history of total thyroidectomy for thyroid cancer, now with iatrogenic hypothyroidism, on levothyroxine therapy - latest thyroid labs reviewed with pt. >> normal: Lab Results  Component Value Date   TSH 2.74 08/11/2021  - she continues on LT4 88 mcg daily - pt feels good on this dose.  She lost weight since last visit, but I doubt that this is related to her levothyroxine dose.  However, 12 pound weight loss can impact her LT4 requirement.  We did discuss about ways to improve her weight and I recommended calorie dense foods.  Given some examples. - we discussed about taking the thyroid hormone every day, with water, >30 minutes before breakfast, separated by >4 hours from acid reflux medications, calcium, iron, multivitamins. Pt. is taking it correctly. - will check thyroid tests today: TSH and fT4 - If labs are abnormal, she will need to return for repeat TFTs in 1.5 months  2.  Prediabetes -Reviewed latest HbA1c, which was stable: Lab Results  Component Value Date   HGBA1C 5.9 (A) 01/13/2021   HGBA1C 5.9 (A) 06/18/2020   HGBA1C 5.8 (H) 01/26/2020  -We will repeat her HbA1c today -We will continue to follow her without medications for now  Needs refills LT4.  Orders Placed This Encounter  Procedures   US THYROID   TSH   T4, free   Thyroglobulin antibody   Thyroglobulin Level   Hemoglobin A1c   Component     Latest Ref Rng 01/28/2022  Hemoglobin A1C     4.6 - 6.5 % 5.0   TSH     0.35 - 5.50 uIU/mL 1.50   T4,Free(Direct)     0.60 - 1.60 ng/dL 0.95   Thyroglobulin Ab     < or = 1 IU/mL <1   Thyroglobulin     ng/mL 1.0 (L)     Tests are at goal.  Thyroglobulin level is stable.  Neck U/S (02/24/2022): The thyroid gland is surgically absent. No abnormal soft tissue identified in the surgical bed. No  pathologically enlarged lymph nodes identified in the adjacent soft tissues of the neck.   IMPRESSION: Status post total thyroidectomy. No findings of locally recurrent disease.  [name], MD PhD [facility]

## 2022-01-28 NOTE — Patient Instructions (Signed)
Please continue: - Levothyroxine 88 mcg daily.  Take the thyroid hormone every day, with water, at least 30 minutes before breakfast, separated by at least 4 hours from: - acid reflux medications - calcium - iron - multivitamins  Please stop at the lab.  Please return in 1 year. 

## 2022-01-29 LAB — THYROGLOBULIN ANTIBODY: Thyroglobulin Ab: <1 IU/mL (ref <=1)

## 2022-01-29 LAB — THYROGLOBULIN LEVEL: Thyroglobulin: 1 ng/mL — ABNORMAL LOW

## 2022-01-30 MED ORDER — LEVOTHYROXINE SODIUM 88 MCG PO TABS: 88.0000 ug | ORAL_TABLET | Freq: Every day | ORAL | 3 refills | Status: DC

## 2022-02-02 ENCOUNTER — Encounter: Payer: Self-pay | Admitting: Family Medicine

## 2022-02-02 ENCOUNTER — Ambulatory Visit (INDEPENDENT_AMBULATORY_CARE_PROVIDER_SITE_OTHER): Payer: Medicare HMO | Admitting: Family Medicine

## 2022-02-02 VITALS — BP 136/80 | HR 63 | Temp 98.3°F | Ht 60.0 in | Wt 96.4 lb

## 2022-02-02 DIAGNOSIS — E785 Hyperlipidemia, unspecified: Secondary | ICD-10-CM | POA: Diagnosis not present

## 2022-02-02 DIAGNOSIS — I10 Essential (primary) hypertension: Secondary | ICD-10-CM

## 2022-02-02 DIAGNOSIS — R739 Hyperglycemia, unspecified: Secondary | ICD-10-CM | POA: Diagnosis not present

## 2022-02-02 DIAGNOSIS — C73 Malignant neoplasm of thyroid gland: Secondary | ICD-10-CM | POA: Diagnosis not present

## 2022-02-02 DIAGNOSIS — Z0000 Encounter for general adult medical examination without abnormal findings: Secondary | ICD-10-CM

## 2022-02-02 NOTE — Patient Instructions (Addendum)
COVID booster vaccination #6- she is going to check with Dr. Irene Limbo about timing ? ?Consider Tdap at pharmacy- need if you get a cut/scrape/bad sting ? ?We opted out of labs today but hoping at some point to recheck b12, folate, and cholesterol ? ?Recommended follow up: Return in about 1 year (around 02/03/2023) for physical or sooner if needed.Schedule b4 you leave. ?

## 2022-02-03 ENCOUNTER — Other Ambulatory Visit: Payer: Self-pay

## 2022-02-03 DIAGNOSIS — C9000 Multiple myeloma not having achieved remission: Secondary | ICD-10-CM

## 2022-02-04 ENCOUNTER — Other Ambulatory Visit: Payer: Self-pay

## 2022-02-04 ENCOUNTER — Inpatient Hospital Stay: Payer: Medicare HMO | Attending: Hematology

## 2022-02-04 ENCOUNTER — Inpatient Hospital Stay: Payer: Medicare HMO

## 2022-02-04 ENCOUNTER — Inpatient Hospital Stay (HOSPITAL_BASED_OUTPATIENT_CLINIC_OR_DEPARTMENT_OTHER): Payer: Medicare HMO | Admitting: Hematology

## 2022-02-04 VITALS — BP 183/74 | HR 63 | Temp 97.6°F | Resp 15 | Ht 60.0 in | Wt 96.6 lb

## 2022-02-04 VITALS — BP 176/80

## 2022-02-04 DIAGNOSIS — K5900 Constipation, unspecified: Secondary | ICD-10-CM | POA: Diagnosis not present

## 2022-02-04 DIAGNOSIS — Z5112 Encounter for antineoplastic immunotherapy: Secondary | ICD-10-CM | POA: Insufficient documentation

## 2022-02-04 DIAGNOSIS — E041 Nontoxic single thyroid nodule: Secondary | ICD-10-CM | POA: Diagnosis not present

## 2022-02-04 DIAGNOSIS — Z7189 Other specified counseling: Secondary | ICD-10-CM

## 2022-02-04 DIAGNOSIS — M8580 Other specified disorders of bone density and structure, unspecified site: Secondary | ICD-10-CM | POA: Diagnosis not present

## 2022-02-04 DIAGNOSIS — C9002 Multiple myeloma in relapse: Secondary | ICD-10-CM

## 2022-02-04 DIAGNOSIS — C9000 Multiple myeloma not having achieved remission: Secondary | ICD-10-CM | POA: Insufficient documentation

## 2022-02-04 DIAGNOSIS — I10 Essential (primary) hypertension: Secondary | ICD-10-CM | POA: Diagnosis not present

## 2022-02-04 DIAGNOSIS — Z86010 Personal history of colonic polyps: Secondary | ICD-10-CM | POA: Insufficient documentation

## 2022-02-04 DIAGNOSIS — R5383 Other fatigue: Secondary | ICD-10-CM | POA: Insufficient documentation

## 2022-02-04 DIAGNOSIS — D6481 Anemia due to antineoplastic chemotherapy: Secondary | ICD-10-CM | POA: Diagnosis not present

## 2022-02-04 DIAGNOSIS — Z95828 Presence of other vascular implants and grafts: Secondary | ICD-10-CM

## 2022-02-04 DIAGNOSIS — Z79899 Other long term (current) drug therapy: Secondary | ICD-10-CM | POA: Insufficient documentation

## 2022-02-04 DIAGNOSIS — T451X5A Adverse effect of antineoplastic and immunosuppressive drugs, initial encounter: Secondary | ICD-10-CM | POA: Insufficient documentation

## 2022-02-04 DIAGNOSIS — G893 Neoplasm related pain (acute) (chronic): Secondary | ICD-10-CM | POA: Insufficient documentation

## 2022-02-04 DIAGNOSIS — Z5111 Encounter for antineoplastic chemotherapy: Secondary | ICD-10-CM | POA: Diagnosis not present

## 2022-02-04 LAB — CMP (CANCER CENTER ONLY)
ALT: 12 U/L (ref 0–44)
AST: 13 U/L — ABNORMAL LOW (ref 15–41)
Albumin: 4 g/dL (ref 3.5–5.0)
Alkaline Phosphatase: 22 U/L — ABNORMAL LOW (ref 38–126)
Anion gap: 5 (ref 5–15)
BUN: 12 mg/dL (ref 8–23)
CO2: 26 mmol/L (ref 22–32)
Calcium: 8.8 mg/dL — ABNORMAL LOW (ref 8.9–10.3)
Chloride: 108 mmol/L (ref 98–111)
Creatinine: 0.51 mg/dL (ref 0.44–1.00)
GFR, Estimated: >60 mL/min (ref >60)
Glucose, Bld: 99 mg/dL (ref 70–99)
Potassium: 3.8 mmol/L (ref 3.5–5.1)
Sodium: 139 mmol/L (ref 135–145)
Total Bilirubin: 0.5 mg/dL (ref 0.3–1.2)
Total Protein: 6.5 g/dL (ref 6.5–8.1)

## 2022-02-04 LAB — CBC WITH DIFFERENTIAL (CANCER CENTER ONLY)
Abs Immature Granulocytes: 0.01 10*3/uL (ref 0.00–0.07)
Basophils Absolute: 0.1 10*3/uL (ref 0.0–0.1)
Basophils Relative: 2 %
Eosinophils Absolute: 0.1 10*3/uL (ref 0.0–0.5)
Eosinophils Relative: 2 %
HCT: 30.7 % — ABNORMAL LOW (ref 36.0–46.0)
Hemoglobin: 10 g/dL — ABNORMAL LOW (ref 12.0–15.0)
Immature Granulocytes: 0 %
Lymphocytes Relative: 11 %
Lymphs Abs: 0.3 10*3/uL — ABNORMAL LOW (ref 0.7–4.0)
MCH: 35.6 pg — ABNORMAL HIGH (ref 26.0–34.0)
MCHC: 32.6 g/dL (ref 30.0–36.0)
MCV: 109.3 fL — ABNORMAL HIGH (ref 80.0–100.0)
Monocytes Absolute: 0.4 10*3/uL (ref 0.1–1.0)
Monocytes Relative: 13 %
Neutro Abs: 2.2 10*3/uL (ref 1.7–7.7)
Neutrophils Relative %: 72 %
Platelet Count: 279 10*3/uL (ref 150–400)
RBC: 2.81 MIL/uL — ABNORMAL LOW (ref 3.87–5.11)
RDW: 13 % (ref 11.5–15.5)
WBC Count: 3.1 10*3/uL — ABNORMAL LOW (ref 4.0–10.5)
nRBC: 0 % (ref 0.0–0.2)

## 2022-02-04 LAB — VITAMIN B12: Vitamin B-12: 243 pg/mL (ref 180–914)

## 2022-02-04 MED ORDER — SODIUM CHLORIDE 0.9 % IV SOLN: Freq: Once | INTRAVENOUS | Status: AC

## 2022-02-04 MED ORDER — DEXTROSE 5 % IV SOLN
56.0000 mg/m2 | Freq: Once | INTRAVENOUS | Status: AC
  Administered 2022-02-04: 80 mg via INTRAVENOUS
  Filled 2022-02-04: qty 30

## 2022-02-04 MED ORDER — SODIUM CHLORIDE 0.9% FLUSH
10.0000 mL | Freq: Once | INTRAVENOUS | Status: AC
  Administered 2022-02-04: 10 mL

## 2022-02-04 MED ORDER — HEPARIN SOD (PORK) LOCK FLUSH 100 UNIT/ML IV SOLN
500.0000 [IU] | Freq: Once | INTRAVENOUS | Status: AC | PRN
  Administered 2022-02-04: 500 [IU]

## 2022-02-04 MED ORDER — DEXAMETHASONE SODIUM PHOSPHATE 10 MG/ML IJ SOLN
4.0000 mg | Freq: Once | INTRAMUSCULAR | Status: AC
  Administered 2022-02-04: 4 mg via INTRAVENOUS
  Filled 2022-02-04: qty 1

## 2022-02-04 MED ORDER — SODIUM CHLORIDE 0.9% FLUSH
10.0000 mL | INTRAVENOUS | Status: DC | PRN
  Administered 2022-02-04: 10 mL

## 2022-02-04 MED ORDER — ACETAMINOPHEN 500 MG PO TABS
1000.0000 mg | ORAL_TABLET | Freq: Once | ORAL | Status: AC
  Administered 2022-02-04: 1000 mg via ORAL
  Filled 2022-02-04: qty 2

## 2022-02-04 MED ORDER — SENNOSIDES-DOCUSATE SODIUM 8.6-50 MG PO TABS: 2.0000 | ORAL_TABLET | Freq: Every day | ORAL | 3 refills | Status: DC

## 2022-02-04 MED ORDER — ONDANSETRON HCL 4 MG/2ML IJ SOLN
4.0000 mg | Freq: Once | INTRAMUSCULAR | Status: AC
  Administered 2022-02-04: 4 mg via INTRAVENOUS
  Filled 2022-02-04: qty 2

## 2022-02-04 NOTE — Progress Notes (Signed)
?Megan Olson   ?Telephone:(336) [phone] [phone]   ?Clinic Follow up Note  ? ?Patient Care Team: ?[name], MD as PCP - General (Family Medicine) ?[name], MD as Consulting Physician (Hematology) ?Rondel Oh, MD as Referring Physician (Ophthalmology) ?Reola Calkins, MD as Consulting Physician (Hematology and Oncology) ?[name], MD as Consulting Physician (Internal Medicine) ?[name], MD as Consulting Physician (General Surgery) ?[name], [facility] as Pharmacist (Pharmacist) ? ?DOS .Marland Kitchen05/12/2021 ? ? ?CHIEF COMPLAINT:  ?Follow-up for continued valuation and management of multiple myeloma and neck cycle of carfilzomib ? ? ? ?SUMMARY OF ONCOLOGIC HISTORY: ?Oncology History  ?Multiple myeloma not having achieved remission ([place])  ?12/12/2018 Initial Diagnosis  ? Multiple myeloma not having achieved remission ([place]) ? ?  ?12/20/2018 - 10/04/2019 Chemotherapy  ? The patient had dexamethasone (DECADRON) tablet 20 mg, 20 mg (100 % of original dose 20 mg), Oral, Once, 14 of 14 cycles ?Dose modification: 20 mg (original dose 20 mg, Cycle 1), 10 mg (original dose 20 mg, Cycle 14) ?Administration: 20 mg (12/20/2018), 20 mg (12/27/2018), 20 mg (01/10/2019), 20 mg (01/17/2019), 20 mg (01/31/2019), 20 mg (02/07/2019), 20 mg (03/14/2019), 20 mg (03/21/2019), 20 mg (02/21/2019), 20 mg (02/28/2019), 20 mg (04/04/2019), 20 mg (04/11/2019), 20 mg (04/25/2019), 20 mg (05/02/2019), 20 mg (05/16/2019), 20 mg (05/23/2019), 20 mg (06/06/2019), 20 mg (06/13/2019), 20 mg (06/27/2019), 20 mg (07/04/2019), 20 mg (07/18/2019), 20 mg (07/25/2019), 20 mg (08/08/2019), 20 mg (08/15/2019), 20 mg (08/29/2019), 20 mg (09/05/2019), 10 mg (09/19/2019) ?lenalidomide (REVLIMID) 15 MG capsule, 1 of 1 cycle, Start date: 01/18/2019, End date: 02/21/2019 ?bortezomib SQ (VELCADE) chemo injection 2 mg, 1.3 mg/m2 = 2 mg, Subcutaneous,  Once, 14 of 14 cycles ?Administration: 2 mg (12/20/2018), 2 mg (12/27/2018), 2  mg (12/23/2018), 2 mg (12/30/2018), 2 mg (01/10/2019), 2 mg (01/17/2019), 2 mg (01/13/2019), 2 mg (01/20/2019), 2 mg (01/31/2019), 2 mg (02/07/2019), 2 mg (02/03/2019), 2 mg (02/10/2019), 2 mg (03/14/2019), 2 mg (03/17/2019), 2 mg (03/21/2019), 2 mg (03/24/2019), 2 mg (02/21/2019), 2 mg (02/24/2019), 2 mg (02/28/2019), 2 mg (03/03/2019), 2 mg (04/04/2019), 2 mg (04/06/2019), 2 mg (04/11/2019), 2 mg (04/14/2019), 2 mg (04/25/2019), 2 mg (04/28/2019), 2 mg (05/02/2019), 2 mg (05/05/2019), 2 mg (05/16/2019), 2 mg (05/19/2019), 2 mg (05/23/2019), 2 mg (05/26/2019), 2 mg (06/06/2019), 2 mg (06/09/2019), 2 mg (06/13/2019), 2 mg (06/16/2019), 2 mg (06/27/2019), 2 mg (06/30/2019), 2 mg (07/04/2019), 2 mg (07/07/2019), 2 mg (07/18/2019), 2 mg (07/21/2019), 2 mg (07/25/2019), 2 mg (07/28/2019), 2 mg (08/08/2019), 2 mg (08/11/2019), 2 mg (08/15/2019), 2 mg (08/18/2019), 2 mg (08/29/2019), 2 mg (09/01/2019), 2 mg (09/05/2019), 2 mg (09/08/2019), 2 mg (09/19/2019), 2 mg (10/04/2019) ? ? for chemotherapy treatment.  ? ?  ?Multiple myeloma in relapse [facility])  ?04/16/2021 Initial Diagnosis  ? Multiple myeloma in relapse [facility]) ? ?  ?04/30/2021 -  Chemotherapy  ? Patient is on Treatment Plan : MYELOMA RELAPSED/REFRACTORY KCd q28d  ? ?  ?  ? ? ?CURRENT THERAPY: Kyprolis and dexa started on 04/30/2021 ? ?INTERVAL HISTORY: ? ?Ms [name] is here for continued evaluation and management of her multiple myeloma. ?She notes that she had an emergency room visit for severe constipation on 12/26/2021.  This has since resolved and she has been taking her bowel regimen more regularly and her bowel habits have been stable. ?No new focal bone pains. ?No new notable toxicities from her current regimen of carfilzomib. ?No fevers no chills no night sweats.  Good p.o.  intake. ?No new dental issues ?She notes that she had an ultrasound of the neck ordered by her endocrinologist coming up next month. ?She notes that her other medical issues have been stable with her primary care physician. ?Labs available today  reviewed with the patient. ? ?ROS ?10 Point review of Systems was done is negative except as noted above. ? ?MEDICAL HISTORY:  ?Past Medical History:  ?Diagnosis Date  ? Anemia   ? Glaucoma   ? HYPERTENSION 03/11/2007  ? Multiple myeloma ([place])   ? OSTEOPENIA 03/11/2007  ? Pre-diabetes   ? Thyroid cancer ([place])   ? Thyroid disease   ? Tubular adenoma of colon 07/2015  ? ? ?SURGICAL HISTORY: ?Past Surgical History:  ?Procedure Laterality Date  ? BONE MARROW BIOPSY    ? multiple  ? BREAST EXCISIONAL BIOPSY Right 2000  ? BREAST LUMPECTOMY  1990  ? benign  ? CATARACT EXTRACTION Bilateral 2018  ? DILATION AND CURETTAGE OF UTERUS    ? bleeding at menopause. No uterine cancer  ? IR IMAGING GUIDED PORT INSERTION  05/16/2021  ? IR RADIOLOGIST EVAL & MGMT  12/13/2018  ? THYROIDECTOMY N/A 08/02/2020  ? Procedure: TOTAL THYROIDECTOMY;  Surgeon: [name], MD;  Location: [facility] ORS;  Service: General;  Laterality: N/A;  ? TONSILLECTOMY    ? age 81  ? ?I have reviewed the social history and family history with the patient and they are unchanged from previous note. ? ?ALLERGIES:  is allergic to ace inhibitors, benadryl [diphenhydramine], diamox [acetazolamide], sulfamethoxazole, lenalidomide, and penicillins. ? ?MEDICATIONS:  ?Current Outpatient Medications  ?Medication Sig Dispense Refill  ? acyclovir (ZOVIRAX) 400 MG tablet Take 1 tablet (400 mg total) by mouth 2 (two) times daily. 60 tablet 3  ? ALPHAGAN P 0.1 % SOLN Place 1 drop into both eyes in the morning, at noon, and at bedtime.     ? amLODipine (NORVASC) 10 MG tablet Take 1 tablet (10 mg total) by mouth daily. 90 tablet 3  ? aspirin EC 81 MG tablet Take 81 mg by mouth daily.    ? bimatoprost (LUMIGAN) 0.03 % ophthalmic solution Place 1 drop into both eyes at bedtime.     ? Cholecalciferol (VITAMIN D3) 125 MCG (5000 UT) TABS Take 5,000 Units by mouth daily.    ? Co-Enzyme Q-10 100 MG CAPS Take 100 mg by mouth daily.    ? dorzolamide-timolol (COSOPT) 22.3-6.8 MG/ML ophthalmic  solution Place 1 drop into both eyes 2 (two) times daily.   11  ? fentaNYL (DURAGESIC) 12 MCG/HR Place 1 patch onto the skin every 3 (three) days. 10 patch 0  ? levothyroxine (SYNTHROID) 88 MCG tablet Take 1 tablet (88 mcg total) by mouth daily before breakfast. 90 tablet 3  ? lidocaine-prilocaine (EMLA) cream Apply 1 application topically as needed. Apply prior to port access. 30 g 0  ? LUMIGAN 0.01 % SOLN 1 drop at bedtime.    ? magnesium chloride (SLOW-MAG) 64 MG TBEC SR tablet Take by mouth.    ? Multiple Vitamins-Minerals (MULTIVITAMIN WITH MINERALS) tablet Take 1 tablet by mouth daily. Centrum Silver 50 +    ? Omega-3 1000 MG CAPS Take 1,000 mg by mouth daily.     ? ondansetron (ZOFRAN) 8 MG tablet Take 8 mg by mouth 30 to 60 min prior to Cytoxan administration then take 8 mg twice daily as needed for nausea and vomiting. 30 tablet 1  ? oxyCODONE-acetaminophen (PERCOCET) 5-325 MG tablet Take 1-2 tablets by mouth every 4 (  four) hours as needed for moderate pain or severe pain. 90 tablet 0  ? polyethylene glycol (MIRALAX) packet Take 17 g by mouth daily. 30 each 1  ? prochlorperazine (COMPAZINE) 10 MG tablet Take 1 tablet (10 mg total) by mouth every 6 (six) hours as needed (Nausea or vomiting). 30 tablet 1  ? Simethicone (GAS-X PO) Take 1 tablet by mouth as needed.    ? vitamin C (ASCORBIC ACID) 500 MG tablet Take 500 mg by mouth 2 (two) times a week.     ? ?No current facility-administered medications for this visit.  ? ? ?PHYSICAL EXAMINATION:. ?.BP (!) 183/74 (BP Location: Left Arm, Patient Position: Sitting) Comment: nurse notified  Pulse 63   Temp 97.6 ?F (36.4 ?C)   Resp 15   Ht 5' (1.524 m)   Wt 96 lb 9.6 oz (43.8 kg)   SpO2 100%   BMI 18.87 kg/m?  ?NAD ?GENERAL:alert, in no acute distress and comfortable ?SKIN: no acute rashes, no significant lesions ?EYES: conjunctiva are pink and non-injected, sclera anicteric ?OROPHARYNX: MMM, no exudates, no oropharyngeal erythema or ulceration ?NECK: supple,  no JVD ?LYMPH:  no palpable lymphadenopathy in the cervical, axillary or inguinal regions ?LUNGS: clear to auscultation b/l with normal respiratory effort ?HEART: regular rate & rhythm ?ABDOMEN:  normoactive bowel

## 2022-02-05 ENCOUNTER — Encounter: Payer: Self-pay | Admitting: Internal Medicine

## 2022-02-05 LAB — FOLATE RBC
Folate, Hemolysate: 551 ng/mL
Folate, RBC: 1789 ng/mL (ref >498)
Hematocrit: 30.8 % — ABNORMAL LOW (ref 34.0–46.6)

## 2022-02-05 LAB — KAPPA/LAMBDA LIGHT CHAINS
Kappa free light chain: 6.8 mg/L (ref 3.3–19.4)
Kappa, lambda light chain ratio: 0.07 — ABNORMAL LOW (ref 0.26–1.65)
Lambda free light chains: 101.3 mg/L — ABNORMAL HIGH (ref 5.7–26.3)

## 2022-02-09 ENCOUNTER — Telehealth: Payer: Self-pay | Admitting: Hematology

## 2022-02-09 LAB — MULTIPLE MYELOMA PANEL, SERUM
Albumin SerPl Elph-Mcnc: 4 g/dL (ref 2.9–4.4)
Albumin/Glob SerPl: 1.8 — ABNORMAL HIGH (ref 0.7–1.7)
Alpha 1: 0.2 g/dL (ref 0.0–0.4)
Alpha2 Glob SerPl Elph-Mcnc: 0.5 g/dL (ref 0.4–1.0)
B-Globulin SerPl Elph-Mcnc: 1.5 g/dL — ABNORMAL HIGH (ref 0.7–1.3)
Gamma Glob SerPl Elph-Mcnc: 0.1 g/dL — ABNORMAL LOW (ref 0.4–1.8)
Globulin, Total: 2.3 g/dL (ref 2.2–3.9)
IgA: 10 mg/dL — ABNORMAL LOW (ref 64–422)
IgG (Immunoglobin G), Serum: 918 mg/dL (ref 586–1602)
IgM (Immunoglobulin M), Srm: 6 mg/dL — ABNORMAL LOW (ref 26–217)
M Protein SerPl Elph-Mcnc: 0.7 g/dL — ABNORMAL HIGH
Total Protein ELP: 6.3 g/dL (ref 6.0–8.5)

## 2022-02-09 NOTE — Telephone Encounter (Signed)
Scheduled follow-up appointments per 5/3 los. Patient is aware. 

## 2022-02-10 ENCOUNTER — Other Ambulatory Visit: Payer: Self-pay

## 2022-02-10 DIAGNOSIS — C9000 Multiple myeloma not having achieved remission: Secondary | ICD-10-CM

## 2022-02-11 ENCOUNTER — Other Ambulatory Visit: Payer: Self-pay

## 2022-02-11 ENCOUNTER — Inpatient Hospital Stay: Payer: Medicare HMO

## 2022-02-11 VITALS — BP 176/78 | HR 65 | Temp 98.1°F | Resp 16

## 2022-02-11 DIAGNOSIS — M8580 Other specified disorders of bone density and structure, unspecified site: Secondary | ICD-10-CM | POA: Diagnosis not present

## 2022-02-11 DIAGNOSIS — T451X5A Adverse effect of antineoplastic and immunosuppressive drugs, initial encounter: Secondary | ICD-10-CM | POA: Diagnosis not present

## 2022-02-11 DIAGNOSIS — C9000 Multiple myeloma not having achieved remission: Secondary | ICD-10-CM

## 2022-02-11 DIAGNOSIS — Z5112 Encounter for antineoplastic immunotherapy: Secondary | ICD-10-CM | POA: Diagnosis not present

## 2022-02-11 DIAGNOSIS — Z7189 Other specified counseling: Secondary | ICD-10-CM

## 2022-02-11 DIAGNOSIS — C9002 Multiple myeloma in relapse: Secondary | ICD-10-CM

## 2022-02-11 DIAGNOSIS — E041 Nontoxic single thyroid nodule: Secondary | ICD-10-CM | POA: Diagnosis not present

## 2022-02-11 DIAGNOSIS — R5383 Other fatigue: Secondary | ICD-10-CM | POA: Diagnosis not present

## 2022-02-11 DIAGNOSIS — G893 Neoplasm related pain (acute) (chronic): Secondary | ICD-10-CM | POA: Diagnosis not present

## 2022-02-11 DIAGNOSIS — Z79899 Other long term (current) drug therapy: Secondary | ICD-10-CM | POA: Diagnosis not present

## 2022-02-11 DIAGNOSIS — Z86010 Personal history of colonic polyps: Secondary | ICD-10-CM | POA: Diagnosis not present

## 2022-02-11 DIAGNOSIS — Z95828 Presence of other vascular implants and grafts: Secondary | ICD-10-CM

## 2022-02-11 DIAGNOSIS — K5900 Constipation, unspecified: Secondary | ICD-10-CM | POA: Diagnosis not present

## 2022-02-11 DIAGNOSIS — D6481 Anemia due to antineoplastic chemotherapy: Secondary | ICD-10-CM | POA: Diagnosis not present

## 2022-02-11 DIAGNOSIS — I10 Essential (primary) hypertension: Secondary | ICD-10-CM | POA: Diagnosis not present

## 2022-02-11 LAB — CMP (CANCER CENTER ONLY)
ALT: 12 U/L (ref 0–44)
AST: 12 U/L — ABNORMAL LOW (ref 15–41)
Albumin: 4 g/dL (ref 3.5–5.0)
Alkaline Phosphatase: 23 U/L — ABNORMAL LOW (ref 38–126)
Anion gap: 5 (ref 5–15)
BUN: 13 mg/dL (ref 8–23)
CO2: 27 mmol/L (ref 22–32)
Calcium: 8.8 mg/dL — ABNORMAL LOW (ref 8.9–10.3)
Chloride: 108 mmol/L (ref 98–111)
Creatinine: 0.52 mg/dL (ref 0.44–1.00)
GFR, Estimated: >60 mL/min (ref >60)
Glucose, Bld: 100 mg/dL — ABNORMAL HIGH (ref 70–99)
Potassium: 4 mmol/L (ref 3.5–5.1)
Sodium: 140 mmol/L (ref 135–145)
Total Bilirubin: 0.5 mg/dL (ref 0.3–1.2)
Total Protein: 6.7 g/dL (ref 6.5–8.1)

## 2022-02-11 LAB — CBC WITH DIFFERENTIAL (CANCER CENTER ONLY)
Abs Immature Granulocytes: 0.01 10*3/uL (ref 0.00–0.07)
Basophils Absolute: 0 10*3/uL (ref 0.0–0.1)
Basophils Relative: 2 %
Eosinophils Absolute: 0.1 10*3/uL (ref 0.0–0.5)
Eosinophils Relative: 2 %
HCT: 30.4 % — ABNORMAL LOW (ref 36.0–46.0)
Hemoglobin: 9.9 g/dL — ABNORMAL LOW (ref 12.0–15.0)
Immature Granulocytes: 0 %
Lymphocytes Relative: 10 %
Lymphs Abs: 0.3 10*3/uL — ABNORMAL LOW (ref 0.7–4.0)
MCH: 35.2 pg — ABNORMAL HIGH (ref 26.0–34.0)
MCHC: 32.6 g/dL (ref 30.0–36.0)
MCV: 108.2 fL — ABNORMAL HIGH (ref 80.0–100.0)
Monocytes Absolute: 0.6 10*3/uL (ref 0.1–1.0)
Monocytes Relative: 25 %
Neutro Abs: 1.6 10*3/uL — ABNORMAL LOW (ref 1.7–7.7)
Neutrophils Relative %: 61 %
Platelet Count: 156 10*3/uL (ref 150–400)
RBC: 2.81 MIL/uL — ABNORMAL LOW (ref 3.87–5.11)
RDW: 12.6 % (ref 11.5–15.5)
WBC Count: 2.6 10*3/uL — ABNORMAL LOW (ref 4.0–10.5)
nRBC: 0 % (ref 0.0–0.2)

## 2022-02-11 MED ORDER — ACETAMINOPHEN 500 MG PO TABS
1000.0000 mg | ORAL_TABLET | Freq: Once | ORAL | Status: AC
  Administered 2022-02-11: 1000 mg via ORAL
  Filled 2022-02-11: qty 2

## 2022-02-11 MED ORDER — SODIUM CHLORIDE 0.9% FLUSH
10.0000 mL | INTRAVENOUS | Status: DC | PRN
  Administered 2022-02-11: 10 mL

## 2022-02-11 MED ORDER — DEXTROSE 5 % IV SOLN
56.0000 mg/m2 | Freq: Once | INTRAVENOUS | Status: AC
  Administered 2022-02-11: 80 mg via INTRAVENOUS
  Filled 2022-02-11: qty 30

## 2022-02-11 MED ORDER — SODIUM CHLORIDE 0.9% FLUSH
10.0000 mL | Freq: Once | INTRAVENOUS | Status: AC
  Administered 2022-02-11: 10 mL

## 2022-02-11 MED ORDER — ONDANSETRON HCL 4 MG/2ML IJ SOLN
4.0000 mg | Freq: Once | INTRAMUSCULAR | Status: AC
  Administered 2022-02-11: 4 mg via INTRAVENOUS
  Filled 2022-02-11: qty 2

## 2022-02-11 MED ORDER — SODIUM CHLORIDE 0.9 % IV SOLN: Freq: Once | INTRAVENOUS | Status: AC

## 2022-02-11 MED ORDER — DEXAMETHASONE SODIUM PHOSPHATE 10 MG/ML IJ SOLN
4.0000 mg | Freq: Once | INTRAMUSCULAR | Status: AC
  Administered 2022-02-11: 4 mg via INTRAVENOUS
  Filled 2022-02-11: qty 1

## 2022-02-11 MED ORDER — HEPARIN SOD (PORK) LOCK FLUSH 100 UNIT/ML IV SOLN
500.0000 [IU] | Freq: Once | INTRAVENOUS | Status: AC | PRN
  Administered 2022-02-11: 500 [IU]

## 2022-02-11 NOTE — Patient Instructions (Signed)
[facility]  Discharge Instructions: Thank you for choosing [facility] to provide your oncology and hematology care.   If you have a lab appointment with the Cancer Center, please go directly to the Cancer Center and check in at the registration area.   Wear comfortable clothing and clothing appropriate for easy access to any Portacath or PICC line.   We strive to give you quality time with your provider. You may need to reschedule your appointment if you arrive late (15 or more minutes).  Arriving late affects you and other patients whose appointments are after yours.  Also, if you miss three or more appointments without notifying the office, you may be dismissed from the clinic at the provider's discretion.      For prescription refill requests, have your pharmacy contact our office and allow 72 hours for refills to be completed.    Today you received the following chemotherapy and/or immunotherapy agents carfilzomib   To help prevent nausea and vomiting after your treatment, we encourage you to take your nausea medication as directed.  BELOW ARE SYMPTOMS THAT SHOULD BE REPORTED IMMEDIATELY: . *FEVER GREATER THAN 100.4 F (38 C) OR HIGHER . *CHILLS OR SWEATING . *NAUSEA AND VOMITING THAT IS NOT CONTROLLED WITH YOUR NAUSEA MEDICATION . *UNUSUAL SHORTNESS OF BREATH . *UNUSUAL BRUISING OR BLEEDING . *URINARY PROBLEMS (pain or burning when urinating, or frequent urination) . *BOWEL PROBLEMS (unusual diarrhea, constipation, pain near the anus) . TENDERNESS IN MOUTH AND THROAT WITH OR WITHOUT PRESENCE OF ULCERS (sore throat, sores in mouth, or a toothache) . UNUSUAL RASH, SWELLING OR PAIN  . UNUSUAL VAGINAL DISCHARGE OR ITCHING   Items with * indicate a potential emergency and should be followed up as soon as possible or go to the Emergency Department if any problems should occur.  Please show the CHEMOTHERAPY ALERT CARD or IMMUNOTHERAPY ALERT  CARD at check-in to the Emergency Department and triage nurse.  Should you have questions after your visit or need to cancel or reschedule your appointment, please contact [facility]  Dept: [phone]  and follow the prompts.  Office hours are 8:00 a.m. to 4:30 p.m. Monday - Friday. Please note that voicemails left after 4:00 p.m. may not be returned until the following business day.  We are closed weekends and major holidays. You have access to a nurse at all times for urgent questions. Please call the main number to the clinic Dept: [phone] and follow the prompts.   For any non-urgent questions, you may also contact your provider using MyChart. We now offer e-Visits for anyone 18 and older to request care online for non-urgent symptoms. For details visit [email or web address].   Also download the MyChart app! Go to the app store, search "MyChart", open the app, select [facility], and log in with your MyChart username and password.  Due to Covid, a mask is required upon entering the hospital/clinic. If you do not have a mask, one will be given to you upon arrival. For doctor visits, patients may have 1 support person aged 18 or older with them. For treatment visits, patients cannot have anyone with them due to current Covid guidelines and our immunocompromised population.   

## 2022-02-17 ENCOUNTER — Other Ambulatory Visit: Payer: Self-pay

## 2022-02-17 DIAGNOSIS — Z95828 Presence of other vascular implants and grafts: Secondary | ICD-10-CM

## 2022-02-17 DIAGNOSIS — C9000 Multiple myeloma not having achieved remission: Secondary | ICD-10-CM

## 2022-02-18 ENCOUNTER — Other Ambulatory Visit: Payer: Self-pay

## 2022-02-18 ENCOUNTER — Inpatient Hospital Stay: Payer: Medicare HMO

## 2022-02-18 VITALS — BP 164/71 | HR 67 | Temp 98.3°F | Resp 16 | Wt 99.5 lb

## 2022-02-18 DIAGNOSIS — I10 Essential (primary) hypertension: Secondary | ICD-10-CM | POA: Diagnosis not present

## 2022-02-18 DIAGNOSIS — Z95828 Presence of other vascular implants and grafts: Secondary | ICD-10-CM

## 2022-02-18 DIAGNOSIS — C9000 Multiple myeloma not having achieved remission: Secondary | ICD-10-CM

## 2022-02-18 DIAGNOSIS — E041 Nontoxic single thyroid nodule: Secondary | ICD-10-CM | POA: Diagnosis not present

## 2022-02-18 DIAGNOSIS — R5383 Other fatigue: Secondary | ICD-10-CM | POA: Diagnosis not present

## 2022-02-18 DIAGNOSIS — K5900 Constipation, unspecified: Secondary | ICD-10-CM | POA: Diagnosis not present

## 2022-02-18 DIAGNOSIS — Z79899 Other long term (current) drug therapy: Secondary | ICD-10-CM | POA: Diagnosis not present

## 2022-02-18 DIAGNOSIS — C9002 Multiple myeloma in relapse: Secondary | ICD-10-CM

## 2022-02-18 DIAGNOSIS — G893 Neoplasm related pain (acute) (chronic): Secondary | ICD-10-CM | POA: Diagnosis not present

## 2022-02-18 DIAGNOSIS — Z7189 Other specified counseling: Secondary | ICD-10-CM

## 2022-02-18 DIAGNOSIS — D6481 Anemia due to antineoplastic chemotherapy: Secondary | ICD-10-CM | POA: Diagnosis not present

## 2022-02-18 DIAGNOSIS — Z5112 Encounter for antineoplastic immunotherapy: Secondary | ICD-10-CM | POA: Diagnosis not present

## 2022-02-18 DIAGNOSIS — T451X5A Adverse effect of antineoplastic and immunosuppressive drugs, initial encounter: Secondary | ICD-10-CM | POA: Diagnosis not present

## 2022-02-18 DIAGNOSIS — M8580 Other specified disorders of bone density and structure, unspecified site: Secondary | ICD-10-CM | POA: Diagnosis not present

## 2022-02-18 DIAGNOSIS — Z86010 Personal history of colonic polyps: Secondary | ICD-10-CM | POA: Diagnosis not present

## 2022-02-18 LAB — CMP (CANCER CENTER ONLY)
ALT: 11 U/L (ref 0–44)
AST: 11 U/L — ABNORMAL LOW (ref 15–41)
Albumin: 3.9 g/dL (ref 3.5–5.0)
Alkaline Phosphatase: 27 U/L — ABNORMAL LOW (ref 38–126)
Anion gap: 5 (ref 5–15)
BUN: 17 mg/dL (ref 8–23)
CO2: 28 mmol/L (ref 22–32)
Calcium: 9.1 mg/dL (ref 8.9–10.3)
Chloride: 105 mmol/L (ref 98–111)
Creatinine: 0.6 mg/dL (ref 0.44–1.00)
GFR, Estimated: >60 mL/min (ref >60)
Glucose, Bld: 102 mg/dL — ABNORMAL HIGH (ref 70–99)
Potassium: 4.3 mmol/L (ref 3.5–5.1)
Sodium: 138 mmol/L (ref 135–145)
Total Bilirubin: 0.4 mg/dL (ref 0.3–1.2)
Total Protein: 6.3 g/dL — ABNORMAL LOW (ref 6.5–8.1)

## 2022-02-18 LAB — CBC WITH DIFFERENTIAL (CANCER CENTER ONLY)
Abs Immature Granulocytes: 0.03 10*3/uL (ref 0.00–0.07)
Basophils Absolute: 0 10*3/uL (ref 0.0–0.1)
Basophils Relative: 1 %
Eosinophils Absolute: 0.1 10*3/uL (ref 0.0–0.5)
Eosinophils Relative: 2 %
HCT: 29.3 % — ABNORMAL LOW (ref 36.0–46.0)
Hemoglobin: 10 g/dL — ABNORMAL LOW (ref 12.0–15.0)
Immature Granulocytes: 1 %
Lymphocytes Relative: 9 %
Lymphs Abs: 0.4 10*3/uL — ABNORMAL LOW (ref 0.7–4.0)
MCH: 36.6 pg — ABNORMAL HIGH (ref 26.0–34.0)
MCHC: 34.1 g/dL (ref 30.0–36.0)
MCV: 107.3 fL — ABNORMAL HIGH (ref 80.0–100.0)
Monocytes Absolute: 0.6 10*3/uL (ref 0.1–1.0)
Monocytes Relative: 14 %
Neutro Abs: 2.8 10*3/uL (ref 1.7–7.7)
Neutrophils Relative %: 73 %
Platelet Count: 243 10*3/uL (ref 150–400)
RBC: 2.73 MIL/uL — ABNORMAL LOW (ref 3.87–5.11)
RDW: 12.6 % (ref 11.5–15.5)
WBC Count: 3.9 10*3/uL — ABNORMAL LOW (ref 4.0–10.5)
nRBC: 0 % (ref 0.0–0.2)

## 2022-02-18 MED ORDER — DEXAMETHASONE SODIUM PHOSPHATE 10 MG/ML IJ SOLN
4.0000 mg | Freq: Once | INTRAMUSCULAR | Status: AC
  Administered 2022-02-18: 4 mg via INTRAVENOUS
  Filled 2022-02-18: qty 1

## 2022-02-18 MED ORDER — ONDANSETRON HCL 4 MG/2ML IJ SOLN
4.0000 mg | Freq: Once | INTRAMUSCULAR | Status: AC
  Administered 2022-02-18: 4 mg via INTRAVENOUS
  Filled 2022-02-18: qty 2

## 2022-02-18 MED ORDER — ACETAMINOPHEN 500 MG PO TABS
1000.0000 mg | ORAL_TABLET | Freq: Once | ORAL | Status: AC
  Administered 2022-02-18: 1000 mg via ORAL
  Filled 2022-02-18: qty 2

## 2022-02-18 MED ORDER — SODIUM CHLORIDE 0.9 % IV SOLN: Freq: Once | INTRAVENOUS | Status: AC

## 2022-02-18 MED ORDER — SODIUM CHLORIDE 0.9% FLUSH
10.0000 mL | Freq: Once | INTRAVENOUS | Status: AC
  Administered 2022-02-18: 10 mL

## 2022-02-18 MED ORDER — DEXTROSE 5 % IV SOLN
56.0000 mg/m2 | Freq: Once | INTRAVENOUS | Status: AC
  Administered 2022-02-18: 80 mg via INTRAVENOUS
  Filled 2022-02-18: qty 30

## 2022-02-18 MED ORDER — HEPARIN SOD (PORK) LOCK FLUSH 100 UNIT/ML IV SOLN
500.0000 [IU] | Freq: Once | INTRAVENOUS | Status: AC | PRN
  Administered 2022-02-18: 500 [IU]

## 2022-02-18 MED ORDER — SODIUM CHLORIDE 0.9% FLUSH
10.0000 mL | INTRAVENOUS | Status: DC | PRN
  Administered 2022-02-18: 10 mL

## 2022-02-18 NOTE — Patient Instructions (Signed)
[facility]  Discharge Instructions: ?Thank you for choosing [facility] to provide your oncology and hematology care.  ? ?If you have a lab appointment with the [facility], please go directly to the [facility] and check in at the registration area. ?  ?Wear comfortable clothing and clothing appropriate for easy access to any Portacath or PICC line.  ? ?We strive to give you quality time with your provider. You may need to reschedule your appointment if you arrive late (15 or more minutes).  Arriving late affects you and other patients whose appointments are after yours.  Also, if you miss three or more appointments without notifying the office, you may be dismissed from the clinic at the provider?s discretion.    ?  ?For prescription refill requests, have your pharmacy contact our office and allow 72 hours for refills to be completed.   ? ?Today you received the following chemotherapy and/or immunotherapy agent: Kyprolis    ?  ?To help prevent nausea and vomiting after your treatment, we encourage you to take your nausea medication as directed. ? ?BELOW ARE SYMPTOMS THAT SHOULD BE REPORTED IMMEDIATELY: ?*FEVER GREATER THAN 100.4 F (38 ?C) OR HIGHER ?*CHILLS OR SWEATING ?*NAUSEA AND VOMITING THAT IS NOT CONTROLLED WITH YOUR NAUSEA MEDICATION ?*UNUSUAL SHORTNESS OF BREATH ?*UNUSUAL BRUISING OR BLEEDING ?*URINARY PROBLEMS (pain or burning when urinating, or frequent urination) ?*BOWEL PROBLEMS (unusual diarrhea, constipation, pain near the anus) ?TENDERNESS IN MOUTH AND THROAT WITH OR WITHOUT PRESENCE OF ULCERS (sore throat, sores in mouth, or a toothache) ?UNUSUAL RASH, SWELLING OR PAIN  ?UNUSUAL VAGINAL DISCHARGE OR ITCHING  ? ?Items with * indicate a potential emergency and should be followed up as soon as possible or go to the Emergency Department if any problems should occur. ? ?Please show the CHEMOTHERAPY ALERT CARD or IMMUNOTHERAPY ALERT CARD at check-in to  the Emergency Department and triage nurse. ? ?Should you have questions after your visit or need to cancel or reschedule your appointment, please contact [facility]  Dept: [phone]  and follow the prompts.  Office hours are 8:00 a.m. to 4:30 p.m. Monday - Friday. Please note that voicemails left after 4:00 p.m. may not be returned until the following business day.  We are closed weekends and major holidays. You have access to a nurse at all times for urgent questions. Please call the main number to the clinic Dept: [phone] and follow the prompts. ? ? ?For any non-urgent questions, you may also contact your provider using MyChart. We now offer e-Visits for anyone 79 and older to request care online for non-urgent symptoms. For details visit [email or web address]. ?  ?Also download the MyChart app! Go to the app store, search "MyChart", open the app, select [facility], and log in with your MyChart username and password. ? ?Due to Covid, a mask is required upon entering the hospital/clinic. If you do not have a mask, one will be given to you upon arrival. For doctor visits, patients may have 1 support person aged 33 or older with them. For treatment visits, patients cannot have anyone with them due to current Covid guidelines and our immunocompromised population.  ? ?

## 2022-02-19 ENCOUNTER — Other Ambulatory Visit: Payer: Self-pay

## 2022-02-19 DIAGNOSIS — C9000 Multiple myeloma not having achieved remission: Secondary | ICD-10-CM

## 2022-02-19 MED ORDER — FENTANYL 12 MCG/HR TD PT72: 1.0000 | MEDICATED_PATCH | TRANSDERMAL | 0 refills | Status: DC

## 2022-02-19 NOTE — Telephone Encounter (Signed)
Pt LVM requesting refill of fentanyl patches, request forwarded to Dr.Kale.

## 2022-02-23 ENCOUNTER — Ambulatory Visit
Admission: RE | Admit: 2022-02-23 | Discharge: 2022-02-23 | Disposition: A | Discharge status: Home / Self Care | Payer: Medicare HMO | Source: Ambulatory Visit | Attending: Internal Medicine | Admitting: Internal Medicine

## 2022-02-23 DIAGNOSIS — C73 Malignant neoplasm of thyroid gland: Secondary | ICD-10-CM

## 2022-02-23 DIAGNOSIS — E890 Postprocedural hypothyroidism: Secondary | ICD-10-CM | POA: Diagnosis not present

## 2022-02-27 ENCOUNTER — Other Ambulatory Visit: Payer: Self-pay

## 2022-02-27 DIAGNOSIS — C9000 Multiple myeloma not having achieved remission: Secondary | ICD-10-CM

## 2022-02-27 MED ORDER — ACYCLOVIR 400 MG PO TABS: 400.0000 mg | ORAL_TABLET | Freq: Two times a day (BID) | ORAL | 3 refills | Status: DC

## 2022-03-04 ENCOUNTER — Inpatient Hospital Stay (HOSPITAL_BASED_OUTPATIENT_CLINIC_OR_DEPARTMENT_OTHER): Payer: Medicare HMO | Admitting: Physician Assistant

## 2022-03-04 ENCOUNTER — Inpatient Hospital Stay: Payer: Medicare HMO

## 2022-03-04 ENCOUNTER — Other Ambulatory Visit: Payer: Self-pay

## 2022-03-04 VITALS — BP 188/92

## 2022-03-04 VITALS — BP 180/106 | HR 65 | Temp 98.8°F | Wt 99.8 lb

## 2022-03-04 DIAGNOSIS — Z7189 Other specified counseling: Secondary | ICD-10-CM

## 2022-03-04 DIAGNOSIS — K5900 Constipation, unspecified: Secondary | ICD-10-CM | POA: Diagnosis not present

## 2022-03-04 DIAGNOSIS — T451X5A Adverse effect of antineoplastic and immunosuppressive drugs, initial encounter: Secondary | ICD-10-CM | POA: Diagnosis not present

## 2022-03-04 DIAGNOSIS — M8580 Other specified disorders of bone density and structure, unspecified site: Secondary | ICD-10-CM | POA: Diagnosis not present

## 2022-03-04 DIAGNOSIS — C9000 Multiple myeloma not having achieved remission: Secondary | ICD-10-CM | POA: Diagnosis not present

## 2022-03-04 DIAGNOSIS — G893 Neoplasm related pain (acute) (chronic): Secondary | ICD-10-CM | POA: Diagnosis not present

## 2022-03-04 DIAGNOSIS — I10 Essential (primary) hypertension: Secondary | ICD-10-CM | POA: Diagnosis not present

## 2022-03-04 DIAGNOSIS — R5383 Other fatigue: Secondary | ICD-10-CM | POA: Diagnosis not present

## 2022-03-04 DIAGNOSIS — Z86010 Personal history of colonic polyps: Secondary | ICD-10-CM | POA: Diagnosis not present

## 2022-03-04 DIAGNOSIS — Z79899 Other long term (current) drug therapy: Secondary | ICD-10-CM | POA: Diagnosis not present

## 2022-03-04 DIAGNOSIS — D6481 Anemia due to antineoplastic chemotherapy: Secondary | ICD-10-CM | POA: Diagnosis not present

## 2022-03-04 DIAGNOSIS — E041 Nontoxic single thyroid nodule: Secondary | ICD-10-CM | POA: Diagnosis not present

## 2022-03-04 DIAGNOSIS — Z5112 Encounter for antineoplastic immunotherapy: Secondary | ICD-10-CM | POA: Diagnosis not present

## 2022-03-04 DIAGNOSIS — C9002 Multiple myeloma in relapse: Secondary | ICD-10-CM

## 2022-03-04 DIAGNOSIS — Z95828 Presence of other vascular implants and grafts: Secondary | ICD-10-CM

## 2022-03-04 LAB — CBC WITH DIFFERENTIAL (CANCER CENTER ONLY)
Abs Immature Granulocytes: 0.02 10*3/uL (ref 0.00–0.07)
Basophils Absolute: 0.1 10*3/uL (ref 0.0–0.1)
Basophils Relative: 2 %
Eosinophils Absolute: 0.1 10*3/uL (ref 0.0–0.5)
Eosinophils Relative: 2 %
HCT: 30.2 % — ABNORMAL LOW (ref 36.0–46.0)
Hemoglobin: 10.1 g/dL — ABNORMAL LOW (ref 12.0–15.0)
Immature Granulocytes: 1 %
Lymphocytes Relative: 10 %
Lymphs Abs: 0.4 10*3/uL — ABNORMAL LOW (ref 0.7–4.0)
MCH: 36.1 pg — ABNORMAL HIGH (ref 26.0–34.0)
MCHC: 33.4 g/dL (ref 30.0–36.0)
MCV: 107.9 fL — ABNORMAL HIGH (ref 80.0–100.0)
Monocytes Absolute: 0.4 10*3/uL (ref 0.1–1.0)
Monocytes Relative: 13 %
Neutro Abs: 2.5 10*3/uL (ref 1.7–7.7)
Neutrophils Relative %: 72 %
Platelet Count: 307 10*3/uL (ref 150–400)
RBC: 2.8 MIL/uL — ABNORMAL LOW (ref 3.87–5.11)
RDW: 13 % (ref 11.5–15.5)
WBC Count: 3.5 10*3/uL — ABNORMAL LOW (ref 4.0–10.5)
nRBC: 0 % (ref 0.0–0.2)

## 2022-03-04 LAB — CMP (CANCER CENTER ONLY)
ALT: 12 U/L (ref 0–44)
AST: 12 U/L — ABNORMAL LOW (ref 15–41)
Albumin: 4.2 g/dL (ref 3.5–5.0)
Alkaline Phosphatase: 25 U/L — ABNORMAL LOW (ref 38–126)
Anion gap: 5 (ref 5–15)
BUN: 15 mg/dL (ref 8–23)
CO2: 28 mmol/L (ref 22–32)
Calcium: 9.2 mg/dL (ref 8.9–10.3)
Chloride: 107 mmol/L (ref 98–111)
Creatinine: 0.52 mg/dL (ref 0.44–1.00)
GFR, Estimated: >60 mL/min (ref >60)
Glucose, Bld: 97 mg/dL (ref 70–99)
Potassium: 3.8 mmol/L (ref 3.5–5.1)
Sodium: 140 mmol/L (ref 135–145)
Total Bilirubin: 0.5 mg/dL (ref 0.3–1.2)
Total Protein: 6.8 g/dL (ref 6.5–8.1)

## 2022-03-04 MED ORDER — SODIUM CHLORIDE 0.9% FLUSH
10.0000 mL | Freq: Once | INTRAVENOUS | Status: AC
  Administered 2022-03-04: 10 mL

## 2022-03-04 MED ORDER — DEXTROSE 5 % IV SOLN
56.0000 mg/m2 | Freq: Once | INTRAVENOUS | Status: AC
  Administered 2022-03-04: 80 mg via INTRAVENOUS
  Filled 2022-03-04: qty 30

## 2022-03-04 MED ORDER — ZOLEDRONIC ACID 4 MG/100ML IV SOLN
4.0000 mg | Freq: Once | INTRAVENOUS | Status: AC
  Administered 2022-03-04: 4 mg via INTRAVENOUS
  Filled 2022-03-04: qty 100

## 2022-03-04 MED ORDER — DEXAMETHASONE SODIUM PHOSPHATE 10 MG/ML IJ SOLN
4.0000 mg | Freq: Once | INTRAMUSCULAR | Status: AC
  Administered 2022-03-04: 4 mg via INTRAVENOUS
  Filled 2022-03-04: qty 1

## 2022-03-04 MED ORDER — ONDANSETRON HCL 4 MG/2ML IJ SOLN
4.0000 mg | Freq: Once | INTRAMUSCULAR | Status: AC
  Administered 2022-03-04: 4 mg via INTRAVENOUS
  Filled 2022-03-04: qty 2

## 2022-03-04 MED ORDER — SODIUM CHLORIDE 0.9 % IV SOLN: Freq: Once | INTRAVENOUS | Status: AC

## 2022-03-04 MED ORDER — HEPARIN SOD (PORK) LOCK FLUSH 100 UNIT/ML IV SOLN
500.0000 [IU] | Freq: Once | INTRAVENOUS | Status: AC | PRN
  Administered 2022-03-04: 500 [IU]

## 2022-03-04 MED ORDER — ACETAMINOPHEN 500 MG PO TABS
1000.0000 mg | ORAL_TABLET | Freq: Once | ORAL | Status: AC
  Administered 2022-03-04: 1000 mg via ORAL
  Filled 2022-03-04: qty 2

## 2022-03-04 MED ORDER — SODIUM CHLORIDE 0.9% FLUSH
10.0000 mL | INTRAVENOUS | Status: DC | PRN
  Administered 2022-03-04: 10 mL

## 2022-03-04 NOTE — Progress Notes (Signed)
Ok to proceed with blood pressure of 188/92 per Dede Query, PA.

## 2022-03-04 NOTE — Patient Instructions (Signed)
[facility]  Discharge Instructions: Thank you for choosing [facility] to provide your oncology and hematology care.   If you have a lab appointment with the [facility], please go directly to the [facility] and check in at the registration area.   Wear comfortable clothing and clothing appropriate for easy access to any Portacath or PICC line.   We strive to give you quality time with your provider. You may need to reschedule your appointment if you arrive late (15 or more minutes).  Arriving late affects you and other patients whose appointments are after yours.  Also, if you miss three or more appointments without notifying the office, you may be dismissed from the clinic at the provider's discretion.      For prescription refill requests, have your pharmacy contact our office and allow 72 hours for refills to be completed.    Today you received the following chemotherapy and/or immunotherapy agent: Kyprolis, Zometa   To help prevent nausea and vomiting after your treatment, we encourage you to take your nausea medication as directed.  BELOW ARE SYMPTOMS THAT SHOULD BE REPORTED IMMEDIATELY: *FEVER GREATER THAN 100.4 F (38 C) OR HIGHER *CHILLS OR SWEATING *NAUSEA AND VOMITING THAT IS NOT CONTROLLED WITH YOUR NAUSEA MEDICATION *UNUSUAL SHORTNESS OF BREATH *UNUSUAL BRUISING OR BLEEDING *URINARY PROBLEMS (pain or burning when urinating, or frequent urination) *BOWEL PROBLEMS (unusual diarrhea, constipation, pain near the anus) TENDERNESS IN MOUTH AND THROAT WITH OR WITHOUT PRESENCE OF ULCERS (sore throat, sores in mouth, or a toothache) UNUSUAL RASH, SWELLING OR PAIN  UNUSUAL VAGINAL DISCHARGE OR ITCHING   Items with * indicate a potential emergency and should be followed up as soon as possible or go to the Emergency Department if any problems should occur.  Please show the CHEMOTHERAPY ALERT CARD or IMMUNOTHERAPY ALERT CARD at check-in  to the Emergency Department and triage nurse.  Should you have questions after your visit or need to cancel or reschedule your appointment, please contact [facility]  Dept: [phone]  and follow the prompts.  Office hours are 8:00 a.m. to 4:30 p.m. Monday - Friday. Please note that voicemails left after 4:00 p.m. may not be returned until the following business day.  We are closed weekends and major holidays. You have access to a nurse at all times for urgent questions. Please call the main number to the clinic Dept: [phone] and follow the prompts.   For any non-urgent questions, you may also contact your provider using MyChart. We now offer e-Visits for anyone 89 and older to request care online for non-urgent symptoms. For details visit [email or web address].   Also download the MyChart app! Go to the app store, search "MyChart", open the app, select [facility], and log in with your MyChart username and password.  Due to Covid, a mask is required upon entering the hospital/clinic. If you do not have a mask, one will be given to you upon arrival. For doctor visits, patients may have 1 support person aged 21 or older with them. For treatment visits, patients cannot have anyone with them due to current Covid guidelines and our immunocompromised population.

## 2022-03-04 NOTE — Progress Notes (Signed)
[facility]   [phone] [phone]   Clinic Follow up Note   Patient Care Team: [name], MD as PCP - General (Family Medicine) [name], MD as Consulting Physician (Hematology) Bond, [name], MD as Referring Physician (Ophthalmology) [name], [name], MD as Consulting Physician (Hematology and Oncology) [name], MD as Consulting Physician (Internal Medicine) [name], MD as Consulting Physician (General Surgery) [name], [facility] as Pharmacist (Pharmacist)  DOS ..03/04/2022  CHIEF COMPLAINT:  Follow-up for continued valuation and management of multiple myeloma  SUMMARY OF ONCOLOGIC HISTORY: Oncology History  Multiple myeloma not having achieved remission ([place])  12/12/2018 Initial Diagnosis   Multiple myeloma not having achieved remission ([place])    12/20/2018 - 10/04/2019 Chemotherapy   The patient had dexamethasone (DECADRON) tablet 20 mg, 20 mg (100 % of original dose 20 mg), Oral, Once, 14 of 14 cycles Dose modification: 20 mg (original dose 20 mg, Cycle 1), 10 mg (original dose 20 mg, Cycle 14) Administration: 20 mg (12/20/2018), 20 mg (12/27/2018), 20 mg (01/10/2019), 20 mg (01/17/2019), 20 mg (01/31/2019), 20 mg (02/07/2019), 20 mg (03/14/2019), 20 mg (03/21/2019), 20 mg (02/21/2019), 20 mg (02/28/2019), 20 mg (04/04/2019), 20 mg (04/11/2019), 20 mg (04/25/2019), 20 mg (05/02/2019), 20 mg (05/16/2019), 20 mg (05/23/2019), 20 mg (06/06/2019), 20 mg (06/13/2019), 20 mg (06/27/2019), 20 mg (07/04/2019), 20 mg (07/18/2019), 20 mg (07/25/2019), 20 mg (08/08/2019), 20 mg (08/15/2019), 20 mg (08/29/2019), 20 mg (09/05/2019), 10 mg (09/19/2019) lenalidomide (REVLIMID) 15 MG capsule, 1 of 1 cycle, Start date: 01/18/2019, End date: 02/21/2019 bortezomib SQ (VELCADE) chemo injection 2 mg, 1.3 mg/m2 = 2 mg, Subcutaneous,  Once, 14 of 14 cycles Administration: 2 mg (12/20/2018), 2 mg (12/27/2018), 2 mg (12/23/2018), 2 mg (12/30/2018), 2  mg (01/10/2019), 2 mg (01/17/2019), 2 mg (01/13/2019), 2 mg (01/20/2019), 2 mg (01/31/2019), 2 mg (02/07/2019), 2 mg (02/03/2019), 2 mg (02/10/2019), 2 mg (03/14/2019), 2 mg (03/17/2019), 2 mg (03/21/2019), 2 mg (03/24/2019), 2 mg (02/21/2019), 2 mg (02/24/2019), 2 mg (02/28/2019), 2 mg (03/03/2019), 2 mg (04/04/2019), 2 mg (04/06/2019), 2 mg (04/11/2019), 2 mg (04/14/2019), 2 mg (04/25/2019), 2 mg (04/28/2019), 2 mg (05/02/2019), 2 mg (05/05/2019), 2 mg (05/16/2019), 2 mg (05/19/2019), 2 mg (05/23/2019), 2 mg (05/26/2019), 2 mg (06/06/2019), 2 mg (06/09/2019), 2 mg (06/13/2019), 2 mg (06/16/2019), 2 mg (06/27/2019), 2 mg (06/30/2019), 2 mg (07/04/2019), 2 mg (07/07/2019), 2 mg (07/18/2019), 2 mg (07/21/2019), 2 mg (07/25/2019), 2 mg (07/28/2019), 2 mg (08/08/2019), 2 mg (08/11/2019), 2 mg (08/15/2019), 2 mg (08/18/2019), 2 mg (08/29/2019), 2 mg (09/01/2019), 2 mg (09/05/2019), 2 mg (09/08/2019), 2 mg (09/19/2019), 2 mg (10/04/2019)   for chemotherapy treatment.     Multiple myeloma in relapse ([place])  04/16/2021 Initial Diagnosis   Multiple myeloma in relapse ([place])    04/30/2021 -  Chemotherapy   Patient is on Treatment Plan : MYELOMA RELAPSED/REFRACTORY KCd q28d        CURRENT THERAPY: Kyprolis and dexamethasone, started on 04/30/2021  INTERVAL HISTORY: RAFAELLA KOLE returns for a follow up for continued evaluation and management of her multiple myeloma. She was last seen by Dr. Jaeli Grubb Limbo on 02/04/2022 and in the interim continues her therapy on Kyprolis and dexamethasone.   At today's visit, Ms. Alonge reports that she is feeling well. Her appetite and energy levels are stable. She experiences fatigue for 1-2 days after treatment but that resolves on its own. She has gained 3 lbs since 02/04/2022. She denies nausea, vomiting or abdominal  pain. Her bowel habits have returned back to baseline. She continues to be aggressive with her bowel regimen with miralax and senna. She has a bowel movement almost every day. She denies easy bruising or signs of active  bleeding. She denies fevers, chills, night sweats, shortness of breath, chest pain, cough, neuropathy or peripheral edema. She has no other complaints.   ROS 10 Point review of Systems was done is negative except as noted above.  MEDICAL HISTORY:  Past Medical History:  Diagnosis Date   Anemia    Glaucoma    HYPERTENSION 03/11/2007   Multiple myeloma ([place])    OSTEOPENIA 03/11/2007   Pre-diabetes    Thyroid cancer ([place])    Thyroid disease    Tubular adenoma of colon 07/2015    SURGICAL HISTORY: Past Surgical History:  Procedure Laterality Date   BONE MARROW BIOPSY     multiple   BREAST EXCISIONAL BIOPSY Right 2000   BREAST LUMPECTOMY  1990   benign   CATARACT EXTRACTION Bilateral 2018   DILATION AND CURETTAGE OF UTERUS     bleeding at menopause. No uterine cancer   IR IMAGING GUIDED PORT INSERTION  05/16/2021   IR RADIOLOGIST EVAL & MGMT  12/13/2018   THYROIDECTOMY N/A 08/02/2020   Procedure: TOTAL THYROIDECTOMY;  Surgeon: [name], MD;  Location: [facility];  Service: General;  Laterality: N/A;   TONSILLECTOMY     age 27   I have reviewed the social history and family history with the patient and they are unchanged from previous note.  ALLERGIES:  is allergic to ace inhibitors, benadryl [diphenhydramine], diamox [acetazolamide], sulfamethoxazole, lenalidomide, and penicillins.  MEDICATIONS:  Current Outpatient Medications  Medication Sig Dispense Refill   acyclovir (ZOVIRAX) 400 MG tablet Take 1 tablet (400 mg total) by mouth 2 (two) times daily. 60 tablet 3   ALPHAGAN P 0.1 % SOLN Place 1 drop into both eyes in the morning, at noon, and at bedtime.      amLODipine (NORVASC) 10 MG tablet Take 1 tablet (10 mg total) by mouth daily. 90 tablet 3   aspirin EC 81 MG tablet Take 81 mg by mouth daily.     bimatoprost (LUMIGAN) 0.03 % ophthalmic solution Place 1 drop into both eyes at bedtime.      Cholecalciferol (VITAMIN D3) 125 MCG (5000 UT) TABS Take 5,000 Units by mouth daily.      Co-Enzyme Q-10 100 MG CAPS Take 100 mg by mouth daily.     dorzolamide-timolol (COSOPT) 22.3-6.8 MG/ML ophthalmic solution Place 1 drop into both eyes 2 (two) times daily.   11   fentaNYL (DURAGESIC) 12 MCG/HR Place 1 patch onto the skin every 3 (three) days. 10 patch 0   levothyroxine (SYNTHROID) 88 MCG tablet Take 1 tablet (88 mcg total) by mouth daily before breakfast. 90 tablet 3   lidocaine-prilocaine (EMLA) cream Apply 1 application topically as needed. Apply prior to port access. 30 g 0   LUMIGAN 0.01 % SOLN 1 drop at bedtime.     magnesium chloride (SLOW-MAG) 64 MG TBEC SR tablet Take by mouth.     Multiple Vitamins-Minerals (MULTIVITAMIN WITH MINERALS) tablet Take 1 tablet by mouth daily. Centrum Silver 50 +     Omega-3 1000 MG CAPS Take 1,000 mg by mouth daily.      ondansetron (ZOFRAN) 8 MG tablet Take 8 mg by mouth 30 to 60 min prior to Cytoxan administration then take 8 mg twice daily as needed for nausea and vomiting. [place]  tablet 1   oxyCODONE-acetaminophen (PERCOCET) 5-325 MG tablet Take 1-2 tablets by mouth every 4 (four) hours as needed for moderate pain or severe pain. 90 tablet 0   polyethylene glycol (MIRALAX) packet Take 17 g by mouth daily. 30 each 1   prochlorperazine (COMPAZINE) 10 MG tablet Take 1 tablet (10 mg total) by mouth every 6 (six) hours as needed (Nausea or vomiting). 30 tablet 1   senna-docusate (SENNA S) 8.6-50 MG tablet Take 2 tablets by mouth at bedtime. 60 tablet 3   Simethicone (GAS-X PO) Take 1 tablet by mouth as needed.     vitamin C (ASCORBIC ACID) 500 MG tablet Take 500 mg by mouth 2 (two) times a week.      No current facility-administered medications for this visit.    PHYSICAL EXAMINATION:. .BP (!) 198/97 (BP Location: Left Arm, Patient Position: Sitting) Comment: nurse notified  Pulse 65   Temp 98.8 F (37.1 C)   Wt 99 lb 12.8 oz (45.3 kg)   SpO2 100%   BMI 19.49 kg/m  Repeat BP was 180/106 (left), 191/100 (right)  NAD GENERAL:alert,  in no acute distress and comfortable SKIN: no acute rashes, no significant lesions EYES: conjunctiva are pink and non-injected, sclera anicteric OROPHARYNX: MMM, no exudates, no oropharyngeal erythema or ulceration NECK: supple, no JVD LYMPH:  no palpable lymphadenopathy in the cervical or inguinal regions LUNGS: clear to auscultation b/l with normal respiratory effort HEART: regular rate & rhythm ABDOMEN:  normoactive bowel sounds , non tender, not distended. Extremity: no pedal edema PSYCH: alert & oriented x 3 with fluent speech NEURO: no focal motor/sensory deficits     LABORATORY DATA:  I have reviewed the data as listed    Latest Ref Rng & Units 03/04/2022    8:23 AM 02/18/2022    8:24 AM 02/11/2022    8:37 AM  CBC  WBC 4.0 - 10.5 K/uL 3.5   3.9   2.6    Hemoglobin 12.0 - 15.0 g/dL 10.1   10.0   9.9    Hematocrit 36.0 - 46.0 % 30.2   29.3   30.4    Platelets 150 - 400 K/uL 307   243   156        Latest Ref Rng & Units 02/18/2022    8:24 AM 02/11/2022    8:37 AM 02/04/2022    8:52 AM  CMP  Glucose 70 - 99 mg/dL 102   100   99    BUN 8 - 23 mg/dL $Remove'17   13   12    'YBmUdfF$ Creatinine 0.44 - 1.00 mg/dL 0.60   0.52   0.51    Sodium 135 - 145 mmol/L 138   140   139    Potassium 3.5 - 5.1 mmol/L 4.3   4.0   3.8    Chloride 98 - 111 mmol/L 105   108   108    CO2 22 - 32 mmol/L $RemoveB'28   27   26    'LZLBgMdY$ Calcium 8.9 - 10.3 mg/dL 9.1   8.8   8.8    Total Protein 6.5 - 8.1 g/dL 6.3   6.7   6.5    Total Bilirubin 0.3 - 1.2 mg/dL 0.4   0.5   0.5    Alkaline Phos 38 - 126 U/L $Remov'27   23   22    'TppPhH$ AST 15 - 41 U/L $Remo'11   12   13    'MpqXc$ ALT 0 - 44 U/L  $'11   12   12      'S$ RADIOGRAPHIC STUDIES: I have personally reviewed the radiological images as listed and agreed with the findings in the report. No results found.   ASSESSMENT & PLAN:  Megan Olson is a 81 y.o. female who presents for a follow up for multiple myeloma.   1. 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  She achieved complete response with negative M protein and negative PET scan in December 2020. -She subsequently went on [place]o maintenance therapy -PET scan from May 08, 2021, which showed focal activity within the right femur, consistent with active multiple myeloma, no other hypermetabolic lesion or plasmacytoma. -05/06/2021 bone marrow biopsy, which showed 3% plasma cells. --Recommend Zometa every 2 months. PLAN Patient's labs from today were reviewed and adequate for treatment. WBC 3.5, Hgb 10.1, Plt 307, Creatinine 0.52, Calcium 9.2 -Myeloma panel from 02/04/2022 showed M protein spike increased from 0.6 to 0.7 g/dl. Discussed with Dr. Kaulana Brindle Limbo who recommends to continue current treatment.  -Continue Kyprolis plus Zometa treatment today.   2. Mild anemia -Stable at 10.1 today -Secondary to carfilzomib  3.  H/O constipation -Requiring ED visit on 12/26/2021 -Likely medication induced.  -Symptoms have improved and bowel habits are back to baseline. Recommend to continue current regimen of MiraLAX daily and Senna 2 capsules p.o. at bedtime.  4.  Cancer related pain  -Currently well controlled -Continue fentanyl patch at 12 mcg/h and Percocet for break through pain.   5. Hypertension: -Elevated at office, rechecked to be 180/106. Felt to be white coat syndrome -Patient reports BP is regularly checked at home and usually runs from 130's/80's.  -Advised to check at home and follow up with PCP  Follow-up -03/11/22: port labs and Kyprolis  -03/18/22: port labs and Kyprolis -04/01/22: port labs, f/u visit with Dr. Laverle Pillard Limbo, treatment with Kyprolis   All of the patient's questions were answered with apparent satisfaction. The patient knows to call the clinic with any problems, questions or concerns.  I have spent a total of 30 minutes minutes of face-to-face and  non-face-to-face time, preparing to see the patient,  performing a medically appropriate examination, counseling and educating the patient, ordering medications, documenting clinical information in the electronic health record, and care coordination.   Dede Query PA-C Dept of Hematology and [place] at [facility] Phone: [phone]

## 2022-03-09 ENCOUNTER — Other Ambulatory Visit: Payer: Self-pay

## 2022-03-09 DIAGNOSIS — C9000 Multiple myeloma not having achieved remission: Secondary | ICD-10-CM

## 2022-03-10 ENCOUNTER — Encounter: Payer: Self-pay | Admitting: Hematology

## 2022-03-10 MED ORDER — OXYCODONE-ACETAMINOPHEN 5-325 MG PO TABS: 1.0000 | ORAL_TABLET | ORAL | 0 refills | Status: DC | PRN

## 2022-03-11 ENCOUNTER — Other Ambulatory Visit: Payer: Self-pay

## 2022-03-11 ENCOUNTER — Inpatient Hospital Stay: Payer: Medicare HMO

## 2022-03-11 ENCOUNTER — Inpatient Hospital Stay: Payer: Medicare HMO | Attending: Hematology

## 2022-03-11 VITALS — BP 181/87 | HR 63 | Temp 98.3°F | Resp 17 | Ht 60.0 in | Wt 100.8 lb

## 2022-03-11 DIAGNOSIS — Z95828 Presence of other vascular implants and grafts: Secondary | ICD-10-CM

## 2022-03-11 DIAGNOSIS — Z5112 Encounter for antineoplastic immunotherapy: Secondary | ICD-10-CM | POA: Insufficient documentation

## 2022-03-11 DIAGNOSIS — Z7189 Other specified counseling: Secondary | ICD-10-CM

## 2022-03-11 DIAGNOSIS — C9000 Multiple myeloma not having achieved remission: Secondary | ICD-10-CM | POA: Insufficient documentation

## 2022-03-11 DIAGNOSIS — C9002 Multiple myeloma in relapse: Secondary | ICD-10-CM

## 2022-03-11 LAB — CMP (CANCER CENTER ONLY)
ALT: 11 U/L (ref 0–44)
AST: 12 U/L — ABNORMAL LOW (ref 15–41)
Albumin: 4.1 g/dL (ref 3.5–5.0)
Alkaline Phosphatase: 25 U/L — ABNORMAL LOW (ref 38–126)
Anion gap: 5 (ref 5–15)
BUN: 16 mg/dL (ref 8–23)
CO2: 27 mmol/L (ref 22–32)
Calcium: 9.3 mg/dL (ref 8.9–10.3)
Chloride: 108 mmol/L (ref 98–111)
Creatinine: 0.53 mg/dL (ref 0.44–1.00)
GFR, Estimated: >60 mL/min (ref >60)
Glucose, Bld: 108 mg/dL — ABNORMAL HIGH (ref 70–99)
Potassium: 4 mmol/L (ref 3.5–5.1)
Sodium: 140 mmol/L (ref 135–145)
Total Bilirubin: 0.5 mg/dL (ref 0.3–1.2)
Total Protein: 6.8 g/dL (ref 6.5–8.1)

## 2022-03-11 LAB — CBC WITH DIFFERENTIAL (CANCER CENTER ONLY)
Abs Immature Granulocytes: 0.02 10*3/uL (ref 0.00–0.07)
Basophils Absolute: 0 10*3/uL (ref 0.0–0.1)
Basophils Relative: 1 %
Eosinophils Absolute: 0.1 10*3/uL (ref 0.0–0.5)
Eosinophils Relative: 2 %
HCT: 30.2 % — ABNORMAL LOW (ref 36.0–46.0)
Hemoglobin: 10 g/dL — ABNORMAL LOW (ref 12.0–15.0)
Immature Granulocytes: 1 %
Lymphocytes Relative: 8 %
Lymphs Abs: 0.3 10*3/uL — ABNORMAL LOW (ref 0.7–4.0)
MCH: 35.7 pg — ABNORMAL HIGH (ref 26.0–34.0)
MCHC: 33.1 g/dL (ref 30.0–36.0)
MCV: 107.9 fL — ABNORMAL HIGH (ref 80.0–100.0)
Monocytes Absolute: 0.7 10*3/uL (ref 0.1–1.0)
Monocytes Relative: 21 %
Neutro Abs: 2.2 10*3/uL (ref 1.7–7.7)
Neutrophils Relative %: 67 %
Platelet Count: 148 10*3/uL — ABNORMAL LOW (ref 150–400)
RBC: 2.8 MIL/uL — ABNORMAL LOW (ref 3.87–5.11)
RDW: 12.8 % (ref 11.5–15.5)
WBC Count: 3.3 10*3/uL — ABNORMAL LOW (ref 4.0–10.5)
nRBC: 0 % (ref 0.0–0.2)

## 2022-03-11 MED ORDER — SODIUM CHLORIDE 0.9 % IV SOLN: Freq: Once | INTRAVENOUS | Status: AC

## 2022-03-11 MED ORDER — ACETAMINOPHEN 500 MG PO TABS
1000.0000 mg | ORAL_TABLET | Freq: Once | ORAL | Status: AC
  Administered 2022-03-11: 1000 mg via ORAL
  Filled 2022-03-11: qty 2

## 2022-03-11 MED ORDER — SODIUM CHLORIDE 0.9% FLUSH
10.0000 mL | Freq: Once | INTRAVENOUS | Status: AC
  Administered 2022-03-11: 10 mL

## 2022-03-11 MED ORDER — DEXAMETHASONE SODIUM PHOSPHATE 10 MG/ML IJ SOLN
4.0000 mg | Freq: Once | INTRAMUSCULAR | Status: AC
  Administered 2022-03-11: 4 mg via INTRAVENOUS
  Filled 2022-03-11: qty 1

## 2022-03-11 MED ORDER — HEPARIN SOD (PORK) LOCK FLUSH 100 UNIT/ML IV SOLN
500.0000 [IU] | Freq: Once | INTRAVENOUS | Status: AC | PRN
  Administered 2022-03-11: 500 [IU]

## 2022-03-11 MED ORDER — SODIUM CHLORIDE 0.9% FLUSH
10.0000 mL | INTRAVENOUS | Status: DC | PRN
  Administered 2022-03-11: 10 mL

## 2022-03-11 MED ORDER — DEXTROSE 5 % IV SOLN
56.0000 mg/m2 | Freq: Once | INTRAVENOUS | Status: AC
  Administered 2022-03-11: 80 mg via INTRAVENOUS
  Filled 2022-03-11: qty 10

## 2022-03-11 MED ORDER — ONDANSETRON HCL 4 MG/2ML IJ SOLN
4.0000 mg | Freq: Once | INTRAMUSCULAR | Status: AC
  Administered 2022-03-11: 4 mg via INTRAVENOUS
  Filled 2022-03-11: qty 2

## 2022-03-11 NOTE — Progress Notes (Signed)
Ok to treat with blood pressure today per Dr. Irene Limbo.

## 2022-03-11 NOTE — Patient Instructions (Signed)
[facility]  Discharge Instructions: Thank you for choosing [facility] to provide your oncology and hematology care.   If you have a lab appointment with the [facility], please go directly to the [facility] and check in at the registration area.   Wear comfortable clothing and clothing appropriate for easy access to any Portacath or PICC line.   We strive to give you quality time with your provider. You may need to reschedule your appointment if you arrive late (15 or more minutes).  Arriving late affects you and other patients whose appointments are after yours.  Also, if you miss three or more appointments without notifying the office, you may be dismissed from the clinic at the provider's discretion.      For prescription refill requests, have your pharmacy contact our office and allow 72 hours for refills to be completed.    Today you received the following chemotherapy and/or immunotherapy agent: Kyprolis      To help prevent nausea and vomiting after your treatment, we encourage you to take your nausea medication as directed.  BELOW ARE SYMPTOMS THAT SHOULD BE REPORTED IMMEDIATELY: *FEVER GREATER THAN 100.4 F (38 C) OR HIGHER *CHILLS OR SWEATING *NAUSEA AND VOMITING THAT IS NOT CONTROLLED WITH YOUR NAUSEA MEDICATION *UNUSUAL SHORTNESS OF BREATH *UNUSUAL BRUISING OR BLEEDING *URINARY PROBLEMS (pain or burning when urinating, or frequent urination) *BOWEL PROBLEMS (unusual diarrhea, constipation, pain near the anus) TENDERNESS IN MOUTH AND THROAT WITH OR WITHOUT PRESENCE OF ULCERS (sore throat, sores in mouth, or a toothache) UNUSUAL RASH, SWELLING OR PAIN  UNUSUAL VAGINAL DISCHARGE OR ITCHING   Items with * indicate a potential emergency and should be followed up as soon as possible or go to the Emergency Department if any problems should occur.  Please show the CHEMOTHERAPY ALERT CARD or IMMUNOTHERAPY ALERT CARD at check-in to  the Emergency Department and triage nurse.  Should you have questions after your visit or need to cancel or reschedule your appointment, please contact [facility]  Dept: [phone]  and follow the prompts.  Office hours are 8:00 a.m. to 4:30 p.m. Monday - Friday. Please note that voicemails left after 4:00 p.m. may not be returned until the following business day.  We are closed weekends and major holidays. You have access to a nurse at all times for urgent questions. Please call the main number to the clinic Dept: [phone] and follow the prompts.   For any non-urgent questions, you may also contact your provider using MyChart. We now offer e-Visits for anyone 81 and older to request care online for non-urgent symptoms. For details visit [email or web address].   Also download the MyChart app! Go to the app store, search "MyChart", open the app, select [facility], and log in with your MyChart username and password.  Due to Covid, a mask is required upon entering the hospital/clinic. If you do not have a mask, one will be given to you upon arrival. For doctor visits, patients may have 1 support person aged 27 or older with them. For treatment visits, patients cannot have anyone with them due to current Covid guidelines and our immunocompromised population.

## 2022-03-18 ENCOUNTER — Inpatient Hospital Stay: Payer: Medicare HMO

## 2022-03-18 ENCOUNTER — Other Ambulatory Visit: Payer: Self-pay

## 2022-03-18 VITALS — BP 158/80 | HR 66 | Temp 98.2°F | Resp 17 | Wt 101.0 lb

## 2022-03-18 DIAGNOSIS — C9000 Multiple myeloma not having achieved remission: Secondary | ICD-10-CM

## 2022-03-18 DIAGNOSIS — C9002 Multiple myeloma in relapse: Secondary | ICD-10-CM

## 2022-03-18 DIAGNOSIS — Z5112 Encounter for antineoplastic immunotherapy: Secondary | ICD-10-CM | POA: Diagnosis not present

## 2022-03-18 DIAGNOSIS — Z95828 Presence of other vascular implants and grafts: Secondary | ICD-10-CM

## 2022-03-18 DIAGNOSIS — Z7189 Other specified counseling: Secondary | ICD-10-CM

## 2022-03-18 LAB — CBC WITH DIFFERENTIAL (CANCER CENTER ONLY)
Abs Immature Granulocytes: 0.02 10*3/uL (ref 0.00–0.07)
Basophils Absolute: 0 10*3/uL (ref 0.0–0.1)
Basophils Relative: 1 %
Eosinophils Absolute: 0.1 10*3/uL (ref 0.0–0.5)
Eosinophils Relative: 2 %
HCT: 30.2 % — ABNORMAL LOW (ref 36.0–46.0)
Hemoglobin: 10.1 g/dL — ABNORMAL LOW (ref 12.0–15.0)
Immature Granulocytes: 1 %
Lymphocytes Relative: 8 %
Lymphs Abs: 0.3 10*3/uL — ABNORMAL LOW (ref 0.7–4.0)
MCH: 35.7 pg — ABNORMAL HIGH (ref 26.0–34.0)
MCHC: 33.4 g/dL (ref 30.0–36.0)
MCV: 106.7 fL — ABNORMAL HIGH (ref 80.0–100.0)
Monocytes Absolute: 0.5 10*3/uL (ref 0.1–1.0)
Monocytes Relative: 13 %
Neutro Abs: 3.1 10*3/uL (ref 1.7–7.7)
Neutrophils Relative %: 75 %
Platelet Count: 246 10*3/uL (ref 150–400)
RBC: 2.83 MIL/uL — ABNORMAL LOW (ref 3.87–5.11)
RDW: 12.5 % (ref 11.5–15.5)
WBC Count: 4.1 10*3/uL (ref 4.0–10.5)
nRBC: 0 % (ref 0.0–0.2)

## 2022-03-18 LAB — CMP (CANCER CENTER ONLY)
ALT: 10 U/L (ref 0–44)
AST: 12 U/L — ABNORMAL LOW (ref 15–41)
Albumin: 4.1 g/dL (ref 3.5–5.0)
Alkaline Phosphatase: 27 U/L — ABNORMAL LOW (ref 38–126)
Anion gap: 5 (ref 5–15)
BUN: 17 mg/dL (ref 8–23)
CO2: 27 mmol/L (ref 22–32)
Calcium: 9.4 mg/dL (ref 8.9–10.3)
Chloride: 107 mmol/L (ref 98–111)
Creatinine: 0.56 mg/dL (ref 0.44–1.00)
GFR, Estimated: >60 mL/min (ref >60)
Glucose, Bld: 102 mg/dL — ABNORMAL HIGH (ref 70–99)
Potassium: 4 mmol/L (ref 3.5–5.1)
Sodium: 139 mmol/L (ref 135–145)
Total Bilirubin: 0.4 mg/dL (ref 0.3–1.2)
Total Protein: 7.1 g/dL (ref 6.5–8.1)

## 2022-03-18 MED ORDER — DEXTROSE 5 % IV SOLN
56.0000 mg/m2 | Freq: Once | INTRAVENOUS | Status: AC
  Administered 2022-03-18: 80 mg via INTRAVENOUS
  Filled 2022-03-18: qty 30

## 2022-03-18 MED ORDER — SODIUM CHLORIDE 0.9 % IV SOLN: Freq: Once | INTRAVENOUS | Status: AC

## 2022-03-18 MED ORDER — DEXAMETHASONE SODIUM PHOSPHATE 10 MG/ML IJ SOLN
4.0000 mg | Freq: Once | INTRAMUSCULAR | Status: AC
  Administered 2022-03-18: 4 mg via INTRAVENOUS
  Filled 2022-03-18: qty 1

## 2022-03-18 MED ORDER — HEPARIN SOD (PORK) LOCK FLUSH 100 UNIT/ML IV SOLN
500.0000 [IU] | Freq: Once | INTRAVENOUS | Status: AC | PRN
  Administered 2022-03-18: 500 [IU]

## 2022-03-18 MED ORDER — ACETAMINOPHEN 500 MG PO TABS
1000.0000 mg | ORAL_TABLET | Freq: Once | ORAL | Status: AC
  Administered 2022-03-18: 1000 mg via ORAL
  Filled 2022-03-18: qty 2

## 2022-03-18 MED ORDER — ONDANSETRON HCL 4 MG/2ML IJ SOLN
4.0000 mg | Freq: Once | INTRAMUSCULAR | Status: AC
  Administered 2022-03-18: 4 mg via INTRAVENOUS
  Filled 2022-03-18: qty 2

## 2022-03-18 MED ORDER — SODIUM CHLORIDE 0.9% FLUSH
10.0000 mL | INTRAVENOUS | Status: DC | PRN
  Administered 2022-03-18: 10 mL

## 2022-03-18 MED ORDER — SODIUM CHLORIDE 0.9% FLUSH
10.0000 mL | Freq: Once | INTRAVENOUS | Status: AC
  Administered 2022-03-18: 10 mL

## 2022-03-18 NOTE — Patient Instructions (Signed)
Carfilzomib injection What is this medication? CARFILZOMIB (kar FILZ oh mib) targets a specific protein within cancer cells and stops the cancer cells from growing. It is used to treat multiple myeloma. This medicine may be used for other purposes; ask your health care provider or pharmacist if you have questions. COMMON BRAND NAME(S): KYPROLIS What should I tell my care team before I take this medication? They need to know if you have any of these conditions: heart disease history of blood clots irregular heartbeat kidney disease liver disease lung or breathing disease an unusual or allergic reaction to carfilzomib, or other medicines, foods, dyes, or preservatives pregnant or trying to get pregnant breast-feeding How should I use this medication? This medicine is for injection or infusion into a vein. It is given by a health care professional in a hospital or clinic setting. Talk to your pediatrician regarding the use of this medicine in children. Special care may be needed. Overdosage: If you think you have taken too much of this medicine contact a poison control center or emergency room at once. NOTE: This medicine is only for you. Do not share this medicine with others. What if I miss a dose? It is important not to miss your dose. Call your doctor or health care professional if you are unable to keep an appointment. What may interact with this medication? Interactions are not expected. This list may not describe all possible interactions. Give your health care provider a list of all the medicines, herbs, non-prescription drugs, or dietary supplements you use. Also tell them if you smoke, drink alcohol, or use illegal drugs. Some items may interact with your medicine. What should I watch for while using this medication? Your condition will be monitored while you are receiving this medicine. You may need blood work done while you are taking this medicine. Do not become pregnant while  taking this medicine or for 6 months after stopping it. Women should inform their health care provider if they wish to become pregnant or think they might be pregnant. Men should not father a child while taking this medicine and for 3 months after stopping it. There is a potential for serious side effects to an unborn child. Talk to your health care provider for more information. Do not breast-feed an infant while taking this medicine or for 2 weeks after stopping it. Check with your health care provider if you have severe diarrhea, nausea, and vomiting, or if you sweat a lot. The loss of too much body fluid may make it dangerous for you to take this medicine. You may get drowsy or dizzy. Do not drive, use machinery, or do anything that needs mental alertness until you know how this medicine affects you. Do not stand up or sit up quickly, especially if you are an older patient. This reduces the risk of dizzy or fainting spells. What side effects may I notice from receiving this medication? Side effects that you should report to your doctor or health care professional as soon as possible: allergic reactions like skin rash, itching or hives, swelling of the face, lips, or tongue confusion dizziness feeling faint or lightheaded fever or chills palpitations seizures signs and symptoms of bleeding such as bloody or black, tarry stools; red or dark-brown urine; spitting up blood or brown material that looks like coffee grounds; red spots on the skin; unusual bruising or bleeding including from the eye, gums, or nose signs and symptoms of a blood clot such as breathing problems; changes in  vision; chest pain; severe, sudden headache; pain, swelling, warmth in the leg; trouble speaking; sudden numbness or weakness of the face, arm or leg signs and symptoms of kidney injury like trouble passing urine or change in the amount of urine signs and symptoms of liver injury like dark yellow or brown urine; general  ill feeling or flu-like symptoms; light-colored stools; loss of appetite; nausea; right upper belly pain; unusually weak or tired; yellowing of the eyes or skin Side effects that usually do not require medical attention (report to your doctor or health care professional if they continue or are bothersome): back pain cough diarrhea headache muscle cramps trouble sleeping vomiting This list may not describe all possible side effects. Call your doctor for medical advice about side effects. You may report side effects to FDA at 1-800-FDA-1088. Where should I keep my medication? This drug is given in a hospital or clinic and will not be stored at home. NOTE: This sheet is a summary. It may not cover all possible information. If you have questions about this medicine, talk to your doctor, pharmacist, or health care provider.  2023 Elsevier/Gold Standard (2020-10-31 00:00:00)

## 2022-03-19 ENCOUNTER — Other Ambulatory Visit: Payer: Self-pay

## 2022-03-19 ENCOUNTER — Telehealth: Payer: Self-pay | Admitting: Pharmacist

## 2022-03-19 DIAGNOSIS — C9000 Multiple myeloma not having achieved remission: Secondary | ICD-10-CM

## 2022-03-19 NOTE — Progress Notes (Signed)
Chronic Care Management Pharmacy Assistant   Name: Megan Olson  MRN: [id number] DOB: [birth year]   Reason for Encounter: Hypertension Adherence Call    Recent office visits:  02/02/2022 OV (PCP) [name], MD; no medication changes indicated.  Recent consult visits:  03/04/2022 OV (Oncology) Lincoln Brigham, PA-C; Continue Kyprolis plus Zometa treatment today.  02/04/2022 OV (Oncology) [name], MD; no medication changes.  01/28/2022 OV (Endocinology) [name], MD; no medication changes indicated.  01/07/2022 OV (Oncology) Dede Query T, PA-C; -Recommend to continue weekly carfilzomib infusion 3 weeks on, 1 week off along with Zometa every 2 months.Marland Kitchen  12/10/2021 OV (Oncology) [name], MD; -Continue Zometa every 2 months  Hospital visits:  12/26/2021 ED visit for Constipation - I will give her a prescription for lactulose for the next few days  Medications: Outpatient Encounter Medications as of 03/19/2022  Medication Sig Note   acyclovir (ZOVIRAX) 400 MG tablet Take 1 tablet (400 mg total) by mouth 2 (two) times daily.    ALPHAGAN P 0.1 % SOLN Place 1 drop into both eyes in the morning, at noon, and at bedtime.     amLODipine (NORVASC) 10 MG tablet Take 1 tablet (10 mg total) by mouth daily.    aspirin EC 81 MG tablet Take 81 mg by mouth daily.    bimatoprost (LUMIGAN) 0.03 % ophthalmic solution Place 1 drop into both eyes at bedtime.     Cholecalciferol (VITAMIN D3) 125 MCG (5000 UT) TABS Take 5,000 Units by mouth daily.    Co-Enzyme Q-10 100 MG CAPS Take 100 mg by mouth daily.    dorzolamide-timolol (COSOPT) 22.3-6.8 MG/ML ophthalmic solution Place 1 drop into both eyes 2 (two) times daily.     fentaNYL (DURAGESIC) 12 MCG/HR Place 1 patch onto the skin every 3 (three) days.    levothyroxine (SYNTHROID) 88 MCG tablet Take 1 tablet (88 mcg total) by mouth daily before breakfast.    lidocaine-prilocaine (EMLA) cream Apply 1  application topically as needed. Apply prior to port access.    LUMIGAN 0.01 % SOLN 1 drop at bedtime.    magnesium chloride (SLOW-MAG) 64 MG TBEC SR tablet Take by mouth.    Multiple Vitamins-Minerals (MULTIVITAMIN WITH MINERALS) tablet Take 1 tablet by mouth daily. Centrum Silver 50 +    Omega-3 1000 MG CAPS Take 1,000 mg by mouth daily.     ondansetron (ZOFRAN) 8 MG tablet Take 8 mg by mouth 30 to 60 min prior to Cytoxan administration then take 8 mg twice daily as needed for nausea and vomiting. 05/16/2021: Not needed   oxyCODONE-acetaminophen (PERCOCET) 5-325 MG tablet Take 1-2 tablets by mouth every 4 (four) hours as needed for moderate pain or severe pain.    polyethylene glycol (MIRALAX) packet Take 17 g by mouth daily.    prochlorperazine (COMPAZINE) 10 MG tablet Take 1 tablet (10 mg total) by mouth every 6 (six) hours as needed (Nausea or vomiting). 05/16/2021: Not needed   senna-docusate (SENNA S) 8.6-50 MG tablet Take 2 tablets by mouth at bedtime.    Simethicone (GAS-X PO) Take 1 tablet by mouth as needed.    vitamin C (ASCORBIC ACID) 500 MG tablet Take 500 mg by mouth 2 (two) times a week.  10/17/2019: Taking every other day   No facility-administered encounter medications on file as of 03/19/2022.   Reviewed chart prior to disease state call. Spoke with patient regarding BP  Recent Office Vitals: BP Readings from Last  3 Encounters:  03/18/22 (!) 158/80  03/11/22 (!) 181/87  03/04/22 (!) 188/92   Pulse Readings from Last 3 Encounters:  03/18/22 66  03/11/22 63  03/04/22 65    Wt Readings from Last 3 Encounters:  03/18/22 101 lb (45.8 kg)  03/11/22 100 lb 12 oz (45.7 kg)  03/04/22 99 lb 12.8 oz (45.3 kg)     Kidney Function Lab Results  Component Value Date/Time   CREATININE 0.56 03/18/2022 08:21 AM   CREATININE 0.53 03/11/2022 08:49 AM   GFR 83.73 11/28/2018 03:48 PM   GFRNONAA >60 03/18/2022 08:21 AM   GFRAA >60 06/13/2020 11:12 AM       Latest Ref Rng & Units  03/18/2022    8:21 AM 03/11/2022    8:49 AM 03/04/2022    8:23 AM  BMP  Glucose 70 - 99 mg/dL 102  108  97   BUN 8 - 23 mg/dL '17  16  15   '$ Creatinine 0.44 - 1.00 mg/dL 0.56  0.53  0.52   Sodium 135 - 145 mmol/L 139  140  140   Potassium 3.5 - 5.1 mmol/L 4.0  4.0  3.8   Chloride 98 - 111 mmol/L 107  108  107   CO2 22 - 32 mmol/L '27  27  28   '$ Calcium 8.9 - 10.3 mg/dL 9.4  9.3  9.2     Current antihypertensive regimen:  Amlodipine 10 mg daily  How often are you checking your Blood Pressure? 3-5x per week  Current home BP readings: 132/79, 138/78, 136/78  What recent interventions/DTPs have been made by any provider to improve Blood Pressure control since last CPP Visit: No recent interventions or DTPs.  Any recent hospitalizations or ED visits since last visit with CPP? No  What diet changes have been made to improve Blood Pressure Control?  No recent diet changes.  What exercise is being done to improve your Blood Pressure Control?  Patient states she is not currently exercising.  Adherence Review: Is the patient currently on ACE/ARB medication? No Does the patient have >5 day gap between last estimated fill dates? No   Care Gaps: Medicare Annual Wellness: Completed 10/28/2021 Hemoglobin A1C: 5.0% on 01/28/2022 Colonoscopy: Aged out Dexa Scan: Completed Mammogram: Last completed 02/08/2018  Future Appointments  Date Time Provider [place]  04/01/2022  8:30 AM [facility] [place] None  04/01/2022  9:00 AM [name], MD [facility] None  04/01/2022 10:00 AM [facility] INFUSION [facility] None  04/08/2022  8:30 AM [facility] [place] [facility] None  04/08/2022  9:30 AM [facility] INFUSION [facility] None  04/15/2022  8:30 AM [facility] [place] FLUSH [facility] None  04/15/2022  9:30 AM [facility] INFUSION [facility] None  04/29/2022  9:30 AM [facility] INFUSION [facility] None  11/12/2022  8:45 AM [facility] HEALTH COACH [facility]  12/07/2022  3:45 PM  [facility] CCM PHARMACIST [facility]  01/26/2023  9:00 AM [name], MD [facility] None  02/08/2023  9:00 AM [name], MD [facility]   Star Rating Drugs: None  April D Calhoun, [place] Pharmacist Assistant [phone]

## 2022-03-20 ENCOUNTER — Encounter: Payer: Self-pay | Admitting: Hematology

## 2022-03-20 MED ORDER — FENTANYL 12 MCG/HR TD PT72: 1.0000 | MEDICATED_PATCH | TRANSDERMAL | 0 refills | Status: DC

## 2022-03-23 DIAGNOSIS — H401122 Primary open-angle glaucoma, left eye, moderate stage: Secondary | ICD-10-CM | POA: Diagnosis not present

## 2022-03-23 DIAGNOSIS — H401112 Primary open-angle glaucoma, right eye, moderate stage: Secondary | ICD-10-CM | POA: Diagnosis not present

## 2022-03-31 ENCOUNTER — Other Ambulatory Visit: Payer: Self-pay

## 2022-03-31 DIAGNOSIS — C9000 Multiple myeloma not having achieved remission: Secondary | ICD-10-CM

## 2022-04-01 ENCOUNTER — Other Ambulatory Visit: Payer: Self-pay

## 2022-04-01 ENCOUNTER — Inpatient Hospital Stay: Payer: Medicare HMO

## 2022-04-01 ENCOUNTER — Inpatient Hospital Stay (HOSPITAL_BASED_OUTPATIENT_CLINIC_OR_DEPARTMENT_OTHER): Payer: Medicare HMO | Admitting: Hematology

## 2022-04-01 VITALS — BP 182/96 | HR 62 | Temp 97.7°F | Resp 16 | Ht 60.0 in | Wt 99.4 lb

## 2022-04-01 VITALS — BP 170/85 | HR 72 | Resp 16

## 2022-04-01 DIAGNOSIS — Z7189 Other specified counseling: Secondary | ICD-10-CM

## 2022-04-01 DIAGNOSIS — Z95828 Presence of other vascular implants and grafts: Secondary | ICD-10-CM

## 2022-04-01 DIAGNOSIS — C9000 Multiple myeloma not having achieved remission: Secondary | ICD-10-CM | POA: Diagnosis not present

## 2022-04-01 DIAGNOSIS — Z5112 Encounter for antineoplastic immunotherapy: Secondary | ICD-10-CM | POA: Diagnosis not present

## 2022-04-01 DIAGNOSIS — C9002 Multiple myeloma in relapse: Secondary | ICD-10-CM

## 2022-04-01 LAB — CBC WITH DIFFERENTIAL (CANCER CENTER ONLY)
Abs Immature Granulocytes: 0.01 10*3/uL (ref 0.00–0.07)
Basophils Absolute: 0.1 10*3/uL (ref 0.0–0.1)
Basophils Relative: 2 %
Eosinophils Absolute: 0.1 10*3/uL (ref 0.0–0.5)
Eosinophils Relative: 3 %
HCT: 30.5 % — ABNORMAL LOW (ref 36.0–46.0)
Hemoglobin: 10.1 g/dL — ABNORMAL LOW (ref 12.0–15.0)
Immature Granulocytes: 0 %
Lymphocytes Relative: 11 %
Lymphs Abs: 0.3 10*3/uL — ABNORMAL LOW (ref 0.7–4.0)
MCH: 35.2 pg — ABNORMAL HIGH (ref 26.0–34.0)
MCHC: 33.1 g/dL (ref 30.0–36.0)
MCV: 106.3 fL — ABNORMAL HIGH (ref 80.0–100.0)
Monocytes Absolute: 0.4 10*3/uL (ref 0.1–1.0)
Monocytes Relative: 13 %
Neutro Abs: 2.3 10*3/uL (ref 1.7–7.7)
Neutrophils Relative %: 71 %
Platelet Count: 312 10*3/uL (ref 150–400)
RBC: 2.87 MIL/uL — ABNORMAL LOW (ref 3.87–5.11)
RDW: 12.6 % (ref 11.5–15.5)
WBC Count: 3.2 10*3/uL — ABNORMAL LOW (ref 4.0–10.5)
nRBC: 0 % (ref 0.0–0.2)

## 2022-04-01 LAB — CMP (CANCER CENTER ONLY)
ALT: 13 U/L (ref 0–44)
AST: 14 U/L — ABNORMAL LOW (ref 15–41)
Albumin: 4.1 g/dL (ref 3.5–5.0)
Alkaline Phosphatase: 29 U/L — ABNORMAL LOW (ref 38–126)
Anion gap: 6 (ref 5–15)
BUN: 15 mg/dL (ref 8–23)
CO2: 28 mmol/L (ref 22–32)
Calcium: 9.2 mg/dL (ref 8.9–10.3)
Chloride: 105 mmol/L (ref 98–111)
Creatinine: 0.59 mg/dL (ref 0.44–1.00)
GFR, Estimated: >60 mL/min (ref >60)
Glucose, Bld: 101 mg/dL — ABNORMAL HIGH (ref 70–99)
Potassium: 4 mmol/L (ref 3.5–5.1)
Sodium: 139 mmol/L (ref 135–145)
Total Bilirubin: 0.4 mg/dL (ref 0.3–1.2)
Total Protein: 7 g/dL (ref 6.5–8.1)

## 2022-04-01 MED ORDER — DEXAMETHASONE SODIUM PHOSPHATE 10 MG/ML IJ SOLN
4.0000 mg | Freq: Once | INTRAMUSCULAR | Status: AC
  Administered 2022-04-01: 4 mg via INTRAVENOUS
  Filled 2022-04-01: qty 1

## 2022-04-01 MED ORDER — SODIUM CHLORIDE 0.9 % IV SOLN: Freq: Once | INTRAVENOUS | Status: AC

## 2022-04-01 MED ORDER — ACETAMINOPHEN 500 MG PO TABS
1000.0000 mg | ORAL_TABLET | Freq: Once | ORAL | Status: AC
  Administered 2022-04-01: 1000 mg via ORAL
  Filled 2022-04-01: qty 2

## 2022-04-01 MED ORDER — HEPARIN SOD (PORK) LOCK FLUSH 100 UNIT/ML IV SOLN
500.0000 [IU] | Freq: Once | INTRAVENOUS | Status: AC | PRN
  Administered 2022-04-01: 500 [IU]

## 2022-04-01 MED ORDER — SODIUM CHLORIDE 0.9% FLUSH
10.0000 mL | Freq: Once | INTRAVENOUS | Status: AC
  Administered 2022-04-01: 10 mL

## 2022-04-01 MED ORDER — SODIUM CHLORIDE 0.9% FLUSH
10.0000 mL | INTRAVENOUS | Status: DC | PRN
  Administered 2022-04-01: 10 mL

## 2022-04-01 MED ORDER — DEXTROSE 5 % IV SOLN
56.0000 mg/m2 | Freq: Once | INTRAVENOUS | Status: AC
  Administered 2022-04-01: 80 mg via INTRAVENOUS
  Filled 2022-04-01: qty 30

## 2022-04-01 MED ORDER — ONDANSETRON HCL 4 MG/2ML IJ SOLN
4.0000 mg | Freq: Once | INTRAMUSCULAR | Status: AC
  Administered 2022-04-01: 4 mg via INTRAVENOUS
  Filled 2022-04-01: qty 2

## 2022-04-01 NOTE — Progress Notes (Signed)
Per Dr. Irene Limbo - okay to treat with elevated BP.

## 2022-04-01 NOTE — Patient Instructions (Addendum)
[facility]   Discharge Instructions: Thank you for choosing [facility] to provide your oncology and hematology care.   If you have a lab appointment with the [facility], please go directly to the [facility] and check in at the registration area.   Wear comfortable clothing and clothing appropriate for easy access to any Portacath or PICC line.   We strive to give you quality time with your provider. You may need to reschedule your appointment if you arrive late (15 or more minutes).  Arriving late affects you and other patients whose appointments are after yours.  Also, if you miss three or more appointments without notifying the office, you may be dismissed from the clinic at the provider's discretion.      For prescription refill requests, have your pharmacy contact our office and allow 72 hours for refills to be completed.    Today you received the following chemotherapy and/or immunotherapy agents: Carfilzomib (Kyprolis)      To help prevent nausea and vomiting after your treatment, we encourage you to take your nausea medication as directed.  BELOW ARE SYMPTOMS THAT SHOULD BE REPORTED IMMEDIATELY: *FEVER GREATER THAN 100.4 F (38 C) OR HIGHER *CHILLS OR SWEATING *NAUSEA AND VOMITING THAT IS NOT CONTROLLED WITH YOUR NAUSEA MEDICATION *UNUSUAL SHORTNESS OF BREATH *UNUSUAL BRUISING OR BLEEDING *URINARY PROBLEMS (pain or burning when urinating, or frequent urination) *BOWEL PROBLEMS (unusual diarrhea, constipation, pain near the anus) TENDERNESS IN MOUTH AND THROAT WITH OR WITHOUT PRESENCE OF ULCERS (sore throat, sores in mouth, or a toothache) UNUSUAL RASH, SWELLING OR PAIN  UNUSUAL VAGINAL DISCHARGE OR ITCHING   Items with * indicate a potential emergency and should be followed up as soon as possible or go to the Emergency Department if any problems should occur.  Please show the CHEMOTHERAPY ALERT CARD or IMMUNOTHERAPY ALERT CARD  at check-in to the Emergency Department and triage nurse.  Should you have questions after your visit or need to cancel or reschedule your appointment, please contact [facility]  Dept: [phone]  and follow the prompts.  Office hours are 8:00 a.m. to 4:30 p.m. Monday - Friday. Please note that voicemails left after 4:00 p.m. may not be returned until the following business day.  We are closed weekends and major holidays. You have access to a nurse at all times for urgent questions. Please call the main number to the clinic Dept: [phone] and follow the prompts.   For any non-urgent questions, you may also contact your provider using MyChart. We now offer e-Visits for anyone 21 and older to request care online for non-urgent symptoms. For details visit [email or web address].   Also download the MyChart app! Go to the app store, search "MyChart", open the app, select [facility], and log in with your MyChart username and password.  Masks are optional in the cancer centers. If you would like for your care team to wear a mask while they are taking care of you, please let them know. For doctor visits, patients may have with them one support person who is at least 81 years old. At this time, visitors are not allowed in the infusion area.

## 2022-04-02 LAB — KAPPA/LAMBDA LIGHT CHAINS
Kappa free light chain: 5.7 mg/L (ref 3.3–19.4)
Kappa, lambda light chain ratio: 0.03 — ABNORMAL LOW (ref 0.26–1.65)
Lambda free light chains: 214.5 mg/L — ABNORMAL HIGH (ref 5.7–26.3)

## 2022-04-07 LAB — MULTIPLE MYELOMA PANEL, SERUM
Albumin SerPl Elph-Mcnc: 3.8 g/dL (ref 2.9–4.4)
Albumin/Glob SerPl: 1.3 (ref 0.7–1.7)
Alpha 1: 0.2 g/dL (ref 0.0–0.4)
Alpha2 Glob SerPl Elph-Mcnc: 0.8 g/dL (ref 0.4–1.0)
B-Globulin SerPl Elph-Mcnc: 1.9 g/dL — ABNORMAL HIGH (ref 0.7–1.3)
Gamma Glob SerPl Elph-Mcnc: 0.1 g/dL — ABNORMAL LOW (ref 0.4–1.8)
Globulin, Total: 3 g/dL (ref 2.2–3.9)
IgA: 10 mg/dL — ABNORMAL LOW (ref 64–422)
IgG (Immunoglobin G), Serum: 1654 mg/dL — ABNORMAL HIGH (ref 586–1602)
IgM (Immunoglobulin M), Srm: 7 mg/dL — ABNORMAL LOW (ref 26–217)
M Protein SerPl Elph-Mcnc: 1.3 g/dL — ABNORMAL HIGH
Total Protein ELP: 6.8 g/dL (ref 6.0–8.5)

## 2022-04-08 ENCOUNTER — Encounter: Payer: Self-pay | Admitting: Hematology

## 2022-04-08 ENCOUNTER — Other Ambulatory Visit: Payer: Self-pay

## 2022-04-08 ENCOUNTER — Inpatient Hospital Stay: Payer: Medicare HMO | Attending: Hematology

## 2022-04-08 ENCOUNTER — Inpatient Hospital Stay: Payer: Medicare HMO

## 2022-04-08 VITALS — BP 155/75 | HR 63 | Temp 98.3°F | Resp 16 | Wt 99.1 lb

## 2022-04-08 DIAGNOSIS — C9000 Multiple myeloma not having achieved remission: Secondary | ICD-10-CM

## 2022-04-08 DIAGNOSIS — Z5112 Encounter for antineoplastic immunotherapy: Secondary | ICD-10-CM | POA: Insufficient documentation

## 2022-04-08 DIAGNOSIS — Z7189 Other specified counseling: Secondary | ICD-10-CM

## 2022-04-08 DIAGNOSIS — Z95828 Presence of other vascular implants and grafts: Secondary | ICD-10-CM

## 2022-04-08 DIAGNOSIS — C9002 Multiple myeloma in relapse: Secondary | ICD-10-CM

## 2022-04-08 LAB — CMP (CANCER CENTER ONLY)
ALT: 12 U/L (ref 0–44)
AST: 13 U/L — ABNORMAL LOW (ref 15–41)
Albumin: 4 g/dL (ref 3.5–5.0)
Alkaline Phosphatase: 30 U/L — ABNORMAL LOW (ref 38–126)
Anion gap: 6 (ref 5–15)
BUN: 15 mg/dL (ref 8–23)
CO2: 28 mmol/L (ref 22–32)
Calcium: 9.4 mg/dL (ref 8.9–10.3)
Chloride: 105 mmol/L (ref 98–111)
Creatinine: 0.63 mg/dL (ref 0.44–1.00)
GFR, Estimated: >60 mL/min (ref >60)
Glucose, Bld: 107 mg/dL — ABNORMAL HIGH (ref 70–99)
Potassium: 4.2 mmol/L (ref 3.5–5.1)
Sodium: 139 mmol/L (ref 135–145)
Total Bilirubin: 0.4 mg/dL (ref 0.3–1.2)
Total Protein: 7.2 g/dL (ref 6.5–8.1)

## 2022-04-08 LAB — CBC WITH DIFFERENTIAL (CANCER CENTER ONLY)
Abs Immature Granulocytes: 0.01 10*3/uL (ref 0.00–0.07)
Basophils Absolute: 0 10*3/uL (ref 0.0–0.1)
Basophils Relative: 2 %
Eosinophils Absolute: 0.1 10*3/uL (ref 0.0–0.5)
Eosinophils Relative: 2 %
HCT: 29.8 % — ABNORMAL LOW (ref 36.0–46.0)
Hemoglobin: 9.9 g/dL — ABNORMAL LOW (ref 12.0–15.0)
Immature Granulocytes: 0 %
Lymphocytes Relative: 10 %
Lymphs Abs: 0.3 10*3/uL — ABNORMAL LOW (ref 0.7–4.0)
MCH: 35 pg — ABNORMAL HIGH (ref 26.0–34.0)
MCHC: 33.2 g/dL (ref 30.0–36.0)
MCV: 105.3 fL — ABNORMAL HIGH (ref 80.0–100.0)
Monocytes Absolute: 0.5 10*3/uL (ref 0.1–1.0)
Monocytes Relative: 19 %
Neutro Abs: 1.8 10*3/uL (ref 1.7–7.7)
Neutrophils Relative %: 67 %
Platelet Count: 152 10*3/uL (ref 150–400)
RBC: 2.83 MIL/uL — ABNORMAL LOW (ref 3.87–5.11)
RDW: 12.6 % (ref 11.5–15.5)
WBC Count: 2.6 10*3/uL — ABNORMAL LOW (ref 4.0–10.5)
nRBC: 0 % (ref 0.0–0.2)

## 2022-04-08 MED ORDER — SODIUM CHLORIDE 0.9% FLUSH
10.0000 mL | Freq: Once | INTRAVENOUS | Status: AC
  Administered 2022-04-08: 10 mL

## 2022-04-08 MED ORDER — ACETAMINOPHEN 500 MG PO TABS
1000.0000 mg | ORAL_TABLET | Freq: Once | ORAL | Status: AC
  Administered 2022-04-08: 1000 mg via ORAL
  Filled 2022-04-08: qty 2

## 2022-04-08 MED ORDER — SODIUM CHLORIDE 0.9% FLUSH
10.0000 mL | INTRAVENOUS | Status: DC | PRN
  Administered 2022-04-08: 10 mL

## 2022-04-08 MED ORDER — SODIUM CHLORIDE 0.9 % IV SOLN: Freq: Once | INTRAVENOUS | Status: DC

## 2022-04-08 MED ORDER — SODIUM CHLORIDE 0.9 % IV SOLN: Freq: Once | INTRAVENOUS | Status: AC

## 2022-04-08 MED ORDER — ONDANSETRON HCL 4 MG/2ML IJ SOLN
4.0000 mg | Freq: Once | INTRAMUSCULAR | Status: AC
  Administered 2022-04-08: 4 mg via INTRAVENOUS
  Filled 2022-04-08: qty 2

## 2022-04-08 MED ORDER — DEXAMETHASONE SODIUM PHOSPHATE 10 MG/ML IJ SOLN
4.0000 mg | Freq: Once | INTRAMUSCULAR | Status: AC
  Administered 2022-04-08: 4 mg via INTRAVENOUS
  Filled 2022-04-08: qty 1

## 2022-04-08 MED ORDER — DEXTROSE 5 % IV SOLN
56.0000 mg/m2 | Freq: Once | INTRAVENOUS | Status: AC
  Administered 2022-04-08: 80 mg via INTRAVENOUS
  Filled 2022-04-08: qty 30

## 2022-04-08 MED ORDER — HEPARIN SOD (PORK) LOCK FLUSH 100 UNIT/ML IV SOLN
500.0000 [IU] | Freq: Once | INTRAVENOUS | Status: AC | PRN
  Administered 2022-04-08: 500 [IU]

## 2022-04-08 NOTE — Progress Notes (Signed)
[facility]   [phone] [phone]   Clinic Follow up Note   Patient Care Team: [name], MD as PCP - General (Family Medicine) [name], MD as Consulting Physician (Hematology) Bond, [name], MD as Referring Physician (Ophthalmology) [name], [name], MD as Consulting Physician (Hematology and Oncology) [name], MD as Consulting Physician (Internal Medicine) [name], MD as Consulting Physician (General Surgery) [name], [facility] as Pharmacist (Pharmacist)  DOS ..04/01/2022  CHIEF COMPLAINT:  For continued evaluation and management of multiple myeloma  SUMMARY OF ONCOLOGIC HISTORY: Oncology History  Multiple myeloma not having achieved remission ([place])  12/12/2018 Initial Diagnosis   Multiple myeloma not having achieved remission ([place])   12/20/2018 - 10/04/2019 Chemotherapy   The patient had dexamethasone (DECADRON) tablet 20 mg, 20 mg (100 % of original dose 20 mg), Oral, Once, 14 of 14 cycles Dose modification: 20 mg (original dose 20 mg, Cycle 1), 10 mg (original dose 20 mg, Cycle 14) Administration: 20 mg (12/20/2018), 20 mg (12/27/2018), 20 mg (01/10/2019), 20 mg (01/17/2019), 20 mg (01/31/2019), 20 mg (02/07/2019), 20 mg (03/14/2019), 20 mg (03/21/2019), 20 mg (02/21/2019), 20 mg (02/28/2019), 20 mg (04/04/2019), 20 mg (04/11/2019), 20 mg (04/25/2019), 20 mg (05/02/2019), 20 mg (05/16/2019), 20 mg (05/23/2019), 20 mg (06/06/2019), 20 mg (06/13/2019), 20 mg (06/27/2019), 20 mg (07/04/2019), 20 mg (07/18/2019), 20 mg (07/25/2019), 20 mg (08/08/2019), 20 mg (08/15/2019), 20 mg (08/29/2019), 20 mg (09/05/2019), 10 mg (09/19/2019) lenalidomide (REVLIMID) 15 MG capsule, 1 of 1 cycle, Start date: 01/18/2019, End date: 02/21/2019 bortezomib SQ (VELCADE) chemo injection 2 mg, 1.3 mg/m2 = 2 mg, Subcutaneous,  Once, 14 of 14 cycles Administration: 2 mg (12/20/2018), 2 mg (12/27/2018), 2 mg (12/23/2018), 2 mg (12/30/2018), 2 mg  (01/10/2019), 2 mg (01/17/2019), 2 mg (01/13/2019), 2 mg (01/20/2019), 2 mg (01/31/2019), 2 mg (02/07/2019), 2 mg (02/03/2019), 2 mg (02/10/2019), 2 mg (03/14/2019), 2 mg (03/17/2019), 2 mg (03/21/2019), 2 mg (03/24/2019), 2 mg (02/21/2019), 2 mg (02/24/2019), 2 mg (02/28/2019), 2 mg (03/03/2019), 2 mg (04/04/2019), 2 mg (04/06/2019), 2 mg (04/11/2019), 2 mg (04/14/2019), 2 mg (04/25/2019), 2 mg (04/28/2019), 2 mg (05/02/2019), 2 mg (05/05/2019), 2 mg (05/16/2019), 2 mg (05/19/2019), 2 mg (05/23/2019), 2 mg (05/26/2019), 2 mg (06/06/2019), 2 mg (06/09/2019), 2 mg (06/13/2019), 2 mg (06/16/2019), 2 mg (06/27/2019), 2 mg (06/30/2019), 2 mg (07/04/2019), 2 mg (07/07/2019), 2 mg (07/18/2019), 2 mg (07/21/2019), 2 mg (07/25/2019), 2 mg (07/28/2019), 2 mg (08/08/2019), 2 mg (08/11/2019), 2 mg (08/15/2019), 2 mg (08/18/2019), 2 mg (08/29/2019), 2 mg (09/01/2019), 2 mg (09/05/2019), 2 mg (09/08/2019), 2 mg (09/19/2019), 2 mg (10/04/2019)  for chemotherapy treatment.    Multiple myeloma in relapse ([place])  04/16/2021 Initial Diagnosis   Multiple myeloma in relapse ([place])   04/30/2021 -  Chemotherapy   Patient is on Treatment Plan : MYELOMA RELAPSED/REFRACTORY KCd q28d       CURRENT THERAPY: Kyprolis and dexamethasone, started on 04/30/2021  INTERVAL HISTORY: Megan Olson returns for continued valuation and management of multiple myeloma.  She notes no new toxicities from her treatment.  Has been tolerating carfilzomib well without any significant side effects.  Grade 1 nausea. Intermittent variable bowel habits. Labs done today were discussed in detail with the patient. No new focal bone pains. No fevers no chills no night sweats no unexpected weight loss.  ROS 10 Point review of Systems was done is negative except as noted above.  MEDICAL HISTORY:  Past Medical History:  Diagnosis  Date   Anemia    Glaucoma    HYPERTENSION 03/11/2007   Multiple myeloma ([place])    OSTEOPENIA 03/11/2007   Pre-diabetes    Thyroid cancer ([place])    Thyroid disease     Tubular adenoma of colon 07/2015    SURGICAL HISTORY: Past Surgical History:  Procedure Laterality Date   BONE MARROW BIOPSY     multiple   BREAST EXCISIONAL BIOPSY Right 2000   BREAST LUMPECTOMY  1990   benign   CATARACT EXTRACTION Bilateral 2018   DILATION AND CURETTAGE OF UTERUS     bleeding at menopause. No uterine cancer   IR IMAGING GUIDED PORT INSERTION  05/16/2021   IR RADIOLOGIST EVAL & MGMT  12/13/2018   THYROIDECTOMY N/A 08/02/2020   Procedure: TOTAL THYROIDECTOMY;  Surgeon: [name], MD;  Location: [facility];  Service: General;  Laterality: N/A;   TONSILLECTOMY     age 73   I have reviewed the social history and family history with the patient and they are unchanged from previous note.  ALLERGIES:  is allergic to ace inhibitors, benadryl [diphenhydramine], diamox [acetazolamide], sulfamethoxazole, lenalidomide, and penicillins.  MEDICATIONS:  Current Outpatient Medications  Medication Sig Dispense Refill   acyclovir (ZOVIRAX) 400 MG tablet Take 1 tablet (400 mg total) by mouth 2 (two) times daily. 60 tablet 3   ALPHAGAN P 0.1 % SOLN Place 1 drop into both eyes in the morning, at noon, and at bedtime.      amLODipine (NORVASC) 10 MG tablet Take 1 tablet (10 mg total) by mouth daily. 90 tablet 3   aspirin EC 81 MG tablet Take 81 mg by mouth daily.     bimatoprost (LUMIGAN) 0.03 % ophthalmic solution Place 1 drop into both eyes at bedtime.      Cholecalciferol (VITAMIN D3) 125 MCG (5000 UT) TABS Take 5,000 Units by mouth daily.     Co-Enzyme Q-10 100 MG CAPS Take 100 mg by mouth daily.     dorzolamide-timolol (COSOPT) 22.3-6.8 MG/ML ophthalmic solution Place 1 drop into both eyes 2 (two) times daily.   11   fentaNYL (DURAGESIC) 12 MCG/HR Place 1 patch onto the skin every 3 (three) days. 10 patch 0   levothyroxine (SYNTHROID) 88 MCG tablet Take 1 tablet (88 mcg total) by mouth daily before breakfast. 90 tablet 3   lidocaine-prilocaine (EMLA) cream Apply 1 application  topically as needed. Apply prior to port access. 30 g 0   LUMIGAN 0.01 % SOLN 1 drop at bedtime.     magnesium chloride (SLOW-MAG) 64 MG TBEC SR tablet Take by mouth.     Multiple Vitamins-Minerals (MULTIVITAMIN WITH MINERALS) tablet Take 1 tablet by mouth daily. Centrum Silver 50 +     Netarsudil-Latanoprost (ROCKLATAN) 0.02-0.005 % SOLN Place 1 drop into both eyes daily.     Omega-3 1000 MG CAPS Take 1,000 mg by mouth daily.      ondansetron (ZOFRAN) 8 MG tablet Take 8 mg by mouth 30 to 60 min prior to Cytoxan administration then take 8 mg twice daily as needed for nausea and vomiting. 30 tablet 1   oxyCODONE-acetaminophen (PERCOCET) 5-325 MG tablet Take 1-2 tablets by mouth every 4 (four) hours as needed for moderate pain or severe pain. 90 tablet 0   polyethylene glycol (MIRALAX) packet Take 17 g by mouth daily. 30 each 1   prochlorperazine (COMPAZINE) 10 MG tablet Take 1 tablet (10 mg total) by mouth every 6 (six) hours as needed (Nausea or vomiting). [place]  tablet 1   senna-docusate (SENNA S) 8.6-50 MG tablet Take 2 tablets by mouth at bedtime. 60 tablet 3   Simethicone (GAS-X PO) Take 1 tablet by mouth as needed.     vitamin C (ASCORBIC ACID) 500 MG tablet Take 500 mg by mouth 2 (two) times a week.      No current facility-administered medications for this visit.    PHYSICAL EXAMINATION:. .BP (!) 182/96 (BP Location: Left Arm, Patient Position: Sitting) Comment: nurse notified  Pulse 62   Temp 97.7 F (36.5 C) (Oral)   Resp 16   Ht 5' (1.524 m)   Wt 99 lb 6.4 oz (45.1 kg)   SpO2 99%   BMI 19.41 kg/m  Repeat BP was 180/106 (left), 191/100 (right) NAD GENERAL:alert, in no acute distress and comfortable SKIN: no acute rashes, no significant lesions EYES: conjunctiva are pink and non-injected, sclera anicteric OROPHARYNX: MMM, no exudates, no oropharyngeal erythema or ulceration NECK: supple, no JVD LYMPH:  no palpable lymphadenopathy in the cervical, axillary or inguinal  regions LUNGS: clear to auscultation b/l with normal respiratory effort HEART: regular rate & rhythm ABDOMEN:  normoactive bowel sounds , non tender, not distended. Extremity: no pedal edema PSYCH: alert & oriented x 3 with fluent speech NEURO: no focal motor/sensory deficits     LABORATORY DATA:   .    Latest Ref Rng & Units 04/01/2022    8:40 AM 03/18/2022    8:21 AM 03/11/2022    8:49 AM  CBC  WBC 4.0 - 10.5 K/uL 3.2  4.1  3.3   Hemoglobin 12.0 - 15.0 g/dL 10.1  10.1  10.0   Hematocrit 36.0 - 46.0 % 30.5  30.2  30.2   Platelets 150 - 400 K/uL 312  246  148       Latest Ref Rng & Units 04/01/2022    8:40 AM 03/18/2022    8:21 AM 03/11/2022    8:49 AM  CMP  Glucose 70 - 99 mg/dL 101  102  108   BUN 8 - 23 mg/dL $Remove'15  17  16   'mBZSTtX$ Creatinine 0.44 - 1.00 mg/dL 0.59  0.56  0.53   Sodium 135 - 145 mmol/L 139  139  140   Potassium 3.5 - 5.1 mmol/L 4.0  4.0  4.0   Chloride 98 - 111 mmol/L 105  107  108   CO2 22 - 32 mmol/L $RemoveB'28  27  27   'EvOWrzJp$ Calcium 8.9 - 10.3 mg/dL 9.2  9.4  9.3   Total Protein 6.5 - 8.1 g/dL 7.0  7.1  6.8   Total Bilirubin 0.3 - 1.2 mg/dL 0.4  0.4  0.5   Alkaline Phos 38 - 126 U/L $Remov'29  27  25   'BMqgYT$ AST 15 - 41 U/L $Remo'14  12  12   'dLUod$ ALT 0 - 44 U/L $Remo'13  10  11     'JpVSo$ RADIOGRAPHIC STUDIES: I have personally reviewed the radiological images as listed and agreed with the findings in the report. No results found.   ASSESSMENT & PLAN:  Megan Olson is a 81 y.o. female who presents for a follow up for multiple myeloma.   1. 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  She achieved complete response with negative M protein and negative PET scan in December 2020. -She subsequently went  on [place] maintenance therapy -PET scan from May 08, 2021, which showed focal activity within the right femur, consistent with active  multiple myeloma, no other hypermetabolic lesion or plasmacytoma. -05/06/2021 bone marrow biopsy, which showed 3% plasma cells. --Recommend Zometa every 2 months. PLAN Labs done today were discussed in detail CBC shows mild stable anemia with normal WBC count and platelets CMP stable Myeloma panel done today is currently pending.  Her last labs showed slightly uptrending M protein. We discussed that depending on her lab results we might need to switch her from carfilzomib to daratumumab plus an immunomodulator -Currently patient has no specific new focal symptoms suggestive of malignancy progression.  2. Mild anemia -Stable at 10.1 today -Secondary to carfilzomib  3.  H/O constipation -Requiring ED visit on 12/26/2021 -Likely medication induced.  -Symptoms have improved and bowel habits are back to baseline. Recommend to continue current regimen of MiraLAX daily and Senna 2 capsules p.o. at bedtime.  4.  Cancer related pain  -Currently well controlled -Continue fentanyl patch at 12 mcg/h and Percocet for break through pain.   5. Hypertension: -Elevated at office, rechecked to be 180/106. Felt to be white coat syndrome -Patient reports BP is regularly checked at home and usually runs from 130's/80's.  -Advised to check at home and follow up with PCP  Follow-up We will schedule treatment according to lab results today.  The total time spent in the appointment was 32 minutes*.  All of the patient's questions were answered with apparent satisfaction. The patient knows to call the clinic with any problems, questions or concerns.   Sullivan Lone MD MS AAHIVMS [facility] Hematology/Oncology Physician [facility]  .*Total Encounter Time as defined by the Centers for Medicare and Medicaid Services includes, in addition to the face-to-face time of a patient visit (documented in the note above) non-face-to-face time: obtaining and reviewing outside history, ordering and reviewing  medications, tests or procedures, care coordination (communications with other health care professionals or caregivers) and documentation in the medical record.

## 2022-04-10 ENCOUNTER — Telehealth: Payer: Self-pay

## 2022-04-10 NOTE — Telephone Encounter (Signed)
T/C from pt asking if she is going to be able to take a low dose of Revlimid along with an antihistamine and prednisone one week prior as was discussed at her last office visit.  She is also asking if taking prednisone would affect her glaucoma?  Please advise

## 2022-04-15 ENCOUNTER — Inpatient Hospital Stay: Payer: Medicare HMO

## 2022-04-15 ENCOUNTER — Other Ambulatory Visit: Payer: Self-pay

## 2022-04-15 VITALS — BP 173/81 | HR 61 | Temp 98.1°F | Resp 14 | Wt 100.8 lb

## 2022-04-15 DIAGNOSIS — C9002 Multiple myeloma in relapse: Secondary | ICD-10-CM

## 2022-04-15 DIAGNOSIS — Z7189 Other specified counseling: Secondary | ICD-10-CM

## 2022-04-15 DIAGNOSIS — Z5112 Encounter for antineoplastic immunotherapy: Secondary | ICD-10-CM | POA: Diagnosis not present

## 2022-04-15 DIAGNOSIS — C9000 Multiple myeloma not having achieved remission: Secondary | ICD-10-CM | POA: Diagnosis not present

## 2022-04-15 DIAGNOSIS — Z95828 Presence of other vascular implants and grafts: Secondary | ICD-10-CM

## 2022-04-15 LAB — CMP (CANCER CENTER ONLY)
ALT: 11 U/L (ref 0–44)
AST: 12 U/L — ABNORMAL LOW (ref 15–41)
Albumin: 3.9 g/dL (ref 3.5–5.0)
Alkaline Phosphatase: 25 U/L — ABNORMAL LOW (ref 38–126)
Anion gap: 5 (ref 5–15)
BUN: 15 mg/dL (ref 8–23)
CO2: 28 mmol/L (ref 22–32)
Calcium: 9.2 mg/dL (ref 8.9–10.3)
Chloride: 106 mmol/L (ref 98–111)
Creatinine: 0.63 mg/dL (ref 0.44–1.00)
GFR, Estimated: >60 mL/min (ref >60)
Glucose, Bld: 112 mg/dL — ABNORMAL HIGH (ref 70–99)
Potassium: 4.4 mmol/L (ref 3.5–5.1)
Sodium: 139 mmol/L (ref 135–145)
Total Bilirubin: 0.5 mg/dL (ref 0.3–1.2)
Total Protein: 7 g/dL (ref 6.5–8.1)

## 2022-04-15 LAB — CBC WITH DIFFERENTIAL (CANCER CENTER ONLY)
Abs Immature Granulocytes: 0.01 10*3/uL (ref 0.00–0.07)
Basophils Absolute: 0 10*3/uL (ref 0.0–0.1)
Basophils Relative: 2 %
Eosinophils Absolute: 0.1 10*3/uL (ref 0.0–0.5)
Eosinophils Relative: 3 %
HCT: 28.8 % — ABNORMAL LOW (ref 36.0–46.0)
Hemoglobin: 9.6 g/dL — ABNORMAL LOW (ref 12.0–15.0)
Immature Granulocytes: 0 %
Lymphocytes Relative: 11 %
Lymphs Abs: 0.3 10*3/uL — ABNORMAL LOW (ref 0.7–4.0)
MCH: 35 pg — ABNORMAL HIGH (ref 26.0–34.0)
MCHC: 33.3 g/dL (ref 30.0–36.0)
MCV: 105.1 fL — ABNORMAL HIGH (ref 80.0–100.0)
Monocytes Absolute: 0.5 10*3/uL (ref 0.1–1.0)
Monocytes Relative: 18 %
Neutro Abs: 1.7 10*3/uL (ref 1.7–7.7)
Neutrophils Relative %: 66 %
Platelet Count: 191 10*3/uL (ref 150–400)
RBC: 2.74 MIL/uL — ABNORMAL LOW (ref 3.87–5.11)
RDW: 12.9 % (ref 11.5–15.5)
WBC Count: 2.6 10*3/uL — ABNORMAL LOW (ref 4.0–10.5)
nRBC: 0 % (ref 0.0–0.2)

## 2022-04-15 MED ORDER — DEXTROSE 5 % IV SOLN
56.0000 mg/m2 | Freq: Once | INTRAVENOUS | Status: AC
  Administered 2022-04-15: 80 mg via INTRAVENOUS
  Filled 2022-04-15: qty 10

## 2022-04-15 MED ORDER — SODIUM CHLORIDE 0.9% FLUSH
10.0000 mL | Freq: Once | INTRAVENOUS | Status: AC
  Administered 2022-04-15: 10 mL

## 2022-04-15 MED ORDER — ONDANSETRON HCL 4 MG/2ML IJ SOLN
4.0000 mg | Freq: Once | INTRAMUSCULAR | Status: AC
  Administered 2022-04-15: 4 mg via INTRAVENOUS
  Filled 2022-04-15: qty 2

## 2022-04-15 MED ORDER — ACETAMINOPHEN 500 MG PO TABS
1000.0000 mg | ORAL_TABLET | Freq: Once | ORAL | Status: AC
  Administered 2022-04-15: 1000 mg via ORAL
  Filled 2022-04-15: qty 2

## 2022-04-15 MED ORDER — SODIUM CHLORIDE 0.9 % IV SOLN: Freq: Once | INTRAVENOUS | Status: AC

## 2022-04-15 MED ORDER — SODIUM CHLORIDE 0.9% FLUSH
10.0000 mL | INTRAVENOUS | Status: DC | PRN
  Administered 2022-04-15: 10 mL

## 2022-04-15 MED ORDER — DEXAMETHASONE SODIUM PHOSPHATE 10 MG/ML IJ SOLN
4.0000 mg | Freq: Once | INTRAMUSCULAR | Status: AC
  Administered 2022-04-15: 4 mg via INTRAVENOUS
  Filled 2022-04-15: qty 1

## 2022-04-15 MED ORDER — HEPARIN SOD (PORK) LOCK FLUSH 100 UNIT/ML IV SOLN
500.0000 [IU] | Freq: Once | INTRAVENOUS | Status: AC | PRN
  Administered 2022-04-15: 500 [IU]

## 2022-04-15 NOTE — Patient Instructions (Signed)
[facility] MEDICAL ONCOLOGY   Discharge Instructions: Thank you for choosing [facility] to provide your oncology and hematology care.   If you have a lab appointment with the Cancer Center, please go directly to the Cancer Center and check in at the registration area.   Wear comfortable clothing and clothing appropriate for easy access to any Portacath or PICC line.   We strive to give you quality time with your provider. You may need to reschedule your appointment if you arrive late (15 or more minutes).  Arriving late affects you and other patients whose appointments are after yours.  Also, if you miss three or more appointments without notifying the office, you may be dismissed from the clinic at the provider's discretion.      For prescription refill requests, have your pharmacy contact our office and allow 72 hours for refills to be completed.    Today you received the following chemotherapy and/or immunotherapy agents: carfilzomib      To help prevent nausea and vomiting after your treatment, we encourage you to take your nausea medication as directed.  BELOW ARE SYMPTOMS THAT SHOULD BE REPORTED IMMEDIATELY: *FEVER GREATER THAN 100.4 F (38 C) OR HIGHER *CHILLS OR SWEATING *NAUSEA AND VOMITING THAT IS NOT CONTROLLED WITH YOUR NAUSEA MEDICATION *UNUSUAL SHORTNESS OF BREATH *UNUSUAL BRUISING OR BLEEDING *URINARY PROBLEMS (pain or burning when urinating, or frequent urination) *BOWEL PROBLEMS (unusual diarrhea, constipation, pain near the anus) TENDERNESS IN MOUTH AND THROAT WITH OR WITHOUT PRESENCE OF ULCERS (sore throat, sores in mouth, or a toothache) UNUSUAL RASH, SWELLING OR PAIN  UNUSUAL VAGINAL DISCHARGE OR ITCHING   Items with * indicate a potential emergency and should be followed up as soon as possible or go to the Emergency Department if any problems should occur.  Please show the CHEMOTHERAPY ALERT CARD or IMMUNOTHERAPY ALERT CARD at check-in  to the Emergency Department and triage nurse.  Should you have questions after your visit or need to cancel or reschedule your appointment, please contact [facility] MED[facility]COL[facility]  Dept: [phone]  and follow the prompts.  Office hours are 8:00 a.m. to 4:30 p.m. Monday - Friday. Please note that voicemails left after 4:00 p.m. may not be returned until the following business day.  We are closed weekends and major holidays. You have access to a nurse at all times for urgent questions. Please call the main number to the clinic Dept: [phone] and follow the prompts.   For any non-urgent questions, you may also contact your provider using MyChart. We now offer e-Visits for anyone 18 and older to request care online for non-urgent symptoms. For details visit [email or web address].   Also download the MyChart app! Go to the app store, search "MyChart", open the app, select [place], and log in with your MyChart username and password.  Masks are optional in the cancer centers. If you would like for your care team to wear a mask while they are taking care of you, please let them know. For doctor visits, patients may have with them one support person who is at least 81 years old. At this time, visitors are not allowed in the infusion area. 

## 2022-04-20 ENCOUNTER — Other Ambulatory Visit: Payer: Self-pay

## 2022-04-20 DIAGNOSIS — C9000 Multiple myeloma not having achieved remission: Secondary | ICD-10-CM

## 2022-04-20 DIAGNOSIS — H401112 Primary open-angle glaucoma, right eye, moderate stage: Secondary | ICD-10-CM | POA: Diagnosis not present

## 2022-04-20 DIAGNOSIS — H401122 Primary open-angle glaucoma, left eye, moderate stage: Secondary | ICD-10-CM | POA: Diagnosis not present

## 2022-04-21 ENCOUNTER — Encounter: Payer: Self-pay | Admitting: Hematology

## 2022-04-21 MED ORDER — FENTANYL 12 MCG/HR TD PT72: 1.0000 | MEDICATED_PATCH | TRANSDERMAL | 0 refills | Status: DC

## 2022-04-27 ENCOUNTER — Other Ambulatory Visit: Payer: Self-pay

## 2022-04-29 ENCOUNTER — Other Ambulatory Visit: Payer: Self-pay

## 2022-04-29 ENCOUNTER — Other Ambulatory Visit: Payer: Self-pay | Admitting: Hematology

## 2022-04-29 ENCOUNTER — Inpatient Hospital Stay (HOSPITAL_BASED_OUTPATIENT_CLINIC_OR_DEPARTMENT_OTHER): Payer: Medicare HMO | Admitting: Hematology

## 2022-04-29 ENCOUNTER — Inpatient Hospital Stay: Payer: Medicare HMO

## 2022-04-29 VITALS — BP 178/78 | HR 58 | Temp 98.2°F | Resp 18 | Wt 99.8 lb

## 2022-04-29 DIAGNOSIS — C9000 Multiple myeloma not having achieved remission: Secondary | ICD-10-CM

## 2022-04-29 DIAGNOSIS — Z7189 Other specified counseling: Secondary | ICD-10-CM | POA: Diagnosis not present

## 2022-04-29 DIAGNOSIS — Z5112 Encounter for antineoplastic immunotherapy: Secondary | ICD-10-CM | POA: Diagnosis not present

## 2022-04-29 DIAGNOSIS — Z95828 Presence of other vascular implants and grafts: Secondary | ICD-10-CM

## 2022-04-29 DIAGNOSIS — C9002 Multiple myeloma in relapse: Secondary | ICD-10-CM

## 2022-04-29 LAB — CMP (CANCER CENTER ONLY)
ALT: 9 U/L (ref 0–44)
AST: 12 U/L — ABNORMAL LOW (ref 15–41)
Albumin: 4.1 g/dL (ref 3.5–5.0)
Alkaline Phosphatase: 26 U/L — ABNORMAL LOW (ref 38–126)
Anion gap: 5 (ref 5–15)
BUN: 17 mg/dL (ref 8–23)
CO2: 27 mmol/L (ref 22–32)
Calcium: 8.8 mg/dL — ABNORMAL LOW (ref 8.9–10.3)
Chloride: 107 mmol/L (ref 98–111)
Creatinine: 0.68 mg/dL (ref 0.44–1.00)
GFR, Estimated: >60 mL/min (ref >60)
Glucose, Bld: 111 mg/dL — ABNORMAL HIGH (ref 70–99)
Potassium: 4.4 mmol/L (ref 3.5–5.1)
Sodium: 139 mmol/L (ref 135–145)
Total Bilirubin: 0.5 mg/dL (ref 0.3–1.2)
Total Protein: 7.4 g/dL (ref 6.5–8.1)

## 2022-04-29 LAB — CBC WITH DIFFERENTIAL (CANCER CENTER ONLY)
Abs Immature Granulocytes: 0.01 10*3/uL (ref 0.00–0.07)
Basophils Absolute: 0.1 10*3/uL (ref 0.0–0.1)
Basophils Relative: 1 %
Eosinophils Absolute: 0.1 10*3/uL (ref 0.0–0.5)
Eosinophils Relative: 2 %
HCT: 29.8 % — ABNORMAL LOW (ref 36.0–46.0)
Hemoglobin: 9.9 g/dL — ABNORMAL LOW (ref 12.0–15.0)
Immature Granulocytes: 0 %
Lymphocytes Relative: 7 %
Lymphs Abs: 0.4 10*3/uL — ABNORMAL LOW (ref 0.7–4.0)
MCH: 35.4 pg — ABNORMAL HIGH (ref 26.0–34.0)
MCHC: 33.2 g/dL (ref 30.0–36.0)
MCV: 106.4 fL — ABNORMAL HIGH (ref 80.0–100.0)
Monocytes Absolute: 0.5 10*3/uL (ref 0.1–1.0)
Monocytes Relative: 10 %
Neutro Abs: 4.1 10*3/uL (ref 1.7–7.7)
Neutrophils Relative %: 80 %
Platelet Count: 318 10*3/uL (ref 150–400)
RBC: 2.8 MIL/uL — ABNORMAL LOW (ref 3.87–5.11)
RDW: 13.2 % (ref 11.5–15.5)
WBC Count: 5.1 10*3/uL (ref 4.0–10.5)
nRBC: 0 % (ref 0.0–0.2)

## 2022-04-29 MED ORDER — SODIUM CHLORIDE 0.9% FLUSH
10.0000 mL | Freq: Once | INTRAVENOUS | Status: AC
  Administered 2022-04-29: 10 mL

## 2022-04-29 MED ORDER — ZOLEDRONIC ACID 4 MG/100ML IV SOLN
4.0000 mg | Freq: Once | INTRAVENOUS | Status: AC
  Administered 2022-04-29: 4 mg via INTRAVENOUS
  Filled 2022-04-29: qty 100

## 2022-04-29 NOTE — Progress Notes (Signed)
Ok to treat with zometa today with a calcium of 8.8 per Dr. Irene Limbo. She is only to receive Zometa today per Dr. Irene Limbo

## 2022-05-06 ENCOUNTER — Telehealth: Payer: Self-pay | Admitting: Hematology

## 2022-05-06 ENCOUNTER — Telehealth: Payer: Self-pay | Admitting: Pharmacist

## 2022-05-06 ENCOUNTER — Other Ambulatory Visit (HOSPITAL_COMMUNITY): Payer: Self-pay

## 2022-05-06 ENCOUNTER — Telehealth: Payer: Self-pay | Admitting: Pharmacy Technician

## 2022-05-06 ENCOUNTER — Encounter: Payer: Self-pay | Admitting: Hematology

## 2022-05-06 MED ORDER — POMALIDOMIDE 2 MG PO CAPS
2.0000 mg | ORAL_CAPSULE | Freq: Every day | ORAL | 0 refills | Status: DC
  Filled 2022-05-06: qty 21, 21d supply, fill #0

## 2022-05-06 MED ORDER — LIDOCAINE-PRILOCAINE 2.5-2.5 % EX CREA
TOPICAL_CREAM | CUTANEOUS | 3 refills | Status: DC
  Filled 2022-05-06: qty 30, 30d supply, fill #0

## 2022-05-06 MED ORDER — PROCHLORPERAZINE MALEATE 10 MG PO TABS
10.0000 mg | ORAL_TABLET | Freq: Four times a day (QID) | ORAL | 1 refills | Status: AC | PRN
  Filled 2022-05-06: qty 30, 8d supply, fill #0

## 2022-05-06 MED ORDER — ACYCLOVIR 400 MG PO TABS
400.0000 mg | ORAL_TABLET | Freq: Two times a day (BID) | ORAL | 11 refills | Status: DC
  Filled 2022-05-06: qty 60, 30d supply, fill #0

## 2022-05-06 MED ORDER — ONDANSETRON HCL 8 MG PO TABS
8.0000 mg | ORAL_TABLET | Freq: Three times a day (TID) | ORAL | 1 refills | Status: AC | PRN
  Filled 2022-05-06: qty 30, 10d supply, fill #0

## 2022-05-06 MED ORDER — DEXAMETHASONE 4 MG PO TABS
20.0000 mg | ORAL_TABLET | Freq: Every day | ORAL | 11 refills | Status: DC
  Filled 2022-05-06: qty 20, 4d supply, fill #0
  Filled 2022-06-01: qty 20, 4d supply, fill #1
  Filled 2022-07-07: qty 20, 4d supply, fill #2

## 2022-05-06 NOTE — Telephone Encounter (Signed)
Per 8/2 in basket, pt has been called and confirmed all appts

## 2022-05-06 NOTE — Telephone Encounter (Signed)
Oral Oncology Patient Advocate Encounter  Was successful in securing patient a $12,000 grant from [facility] to provide copayment coverage for Pomalyst.  This will keep the out of pocket expense at $0.     [employer] ID: [id number]  I have spoken with the patient.   The billing information is as follows and has been shared with [employer].    RxBin: [id number] PCN: [id number] Member ID: [id number] Group ID: [id number] Dates of Eligibility: 04/06/2022 through 04/06/2023  Fund:  [place], CPhT-Adv Pharmacy Patient Advocate Specialist [place] Patient Advocate Team Direct Number: [phone]  Fax: [phone]

## 2022-05-06 NOTE — Telephone Encounter (Signed)
Oral Oncology Pharmacist Encounter  Prior Authorization for Pomalyst has been approved.    PA# [id number] Effective dates: 10/05/21 through 10/04/22  Patients co-pay is $2,295.04  Prescription will need to be filled through [facility].    Leron Croak, PharmD, BCPS, BCOP Hematology/Oncology Clinical Pharmacist [facility] and [place] [phone] 05/06/2022 9:04 AM

## 2022-05-06 NOTE — Progress Notes (Addendum)
[place]   [phone]) [phone] [phone]) [phone]   Clinic Follow up Note   Patient Care Team: [name], MD as PCP - General (Family Medicine) [name], MD as Consulting Physician (Hematology) Bond, [name], MD as Referring Physician (Ophthalmology) [name], [name], MD as Consulting Physician (Hematology and Oncology) [name], MD as Consulting Physician (Internal Medicine) [name], MD as Consulting Physician (General Surgery) [name], [facility] as Pharmacist (Pharmacist)  DOS ..04/29/2022  CHIEF COMPLAINT:  Follow-up for continued evaluation and management of multiple myeloma   SUMMARY OF ONCOLOGIC HISTORY: Oncology History  Multiple myeloma not having achieved remission ([place])  12/12/2018 Initial Diagnosis   Multiple myeloma not having achieved remission ([place])   12/20/2018 - 10/04/2019 Chemotherapy   The patient had dexamethasone (DECADRON) tablet 20 mg, 20 mg (100 % of original dose 20 mg), Oral, Once, 14 of 14 cycles Dose modification: 20 mg (original dose 20 mg, Cycle 1), 10 mg (original dose 20 mg, Cycle 14) Administration: 20 mg (12/20/2018), 20 mg (12/27/2018), 20 mg (01/10/2019), 20 mg (01/17/2019), 20 mg (01/31/2019), 20 mg (02/07/2019), 20 mg (03/14/2019), 20 mg (03/21/2019), 20 mg (02/21/2019), 20 mg (02/28/2019), 20 mg (04/04/2019), 20 mg (04/11/2019), 20 mg (04/25/2019), 20 mg (05/02/2019), 20 mg (05/16/2019), 20 mg (05/23/2019), 20 mg (06/06/2019), 20 mg (06/13/2019), 20 mg (06/27/2019), 20 mg (07/04/2019), 20 mg (07/18/2019), 20 mg (07/25/2019), 20 mg (08/08/2019), 20 mg (08/15/2019), 20 mg (08/29/2019), 20 mg (09/05/2019), 10 mg (09/19/2019) lenalidomide (REVLIMID) 15 MG capsule, 1 of 1 cycle, Start date: 01/18/2019, End date: 02/21/2019 bortezomib SQ (VELCADE) chemo injection 2 mg, 1.3 mg/m2 = 2 mg, Subcutaneous,  Once, 14 of 14 cycles Administration: 2 mg (12/20/2018), 2 mg (12/27/2018), 2 mg (12/23/2018), 2 mg (12/30/2018), 2  mg (01/10/2019), 2 mg (01/17/2019), 2 mg (01/13/2019), 2 mg (01/20/2019), 2 mg (01/31/2019), 2 mg (02/07/2019), 2 mg (02/03/2019), 2 mg (02/10/2019), 2 mg (03/14/2019), 2 mg (03/17/2019), 2 mg (03/21/2019), 2 mg (03/24/2019), 2 mg (02/21/2019), 2 mg (02/24/2019), 2 mg (02/28/2019), 2 mg (03/03/2019), 2 mg (04/04/2019), 2 mg (04/06/2019), 2 mg (04/11/2019), 2 mg (04/14/2019), 2 mg (04/25/2019), 2 mg (04/28/2019), 2 mg (05/02/2019), 2 mg (05/05/2019), 2 mg (05/16/2019), 2 mg (05/19/2019), 2 mg (05/23/2019), 2 mg (05/26/2019), 2 mg (06/06/2019), 2 mg (06/09/2019), 2 mg (06/13/2019), 2 mg (06/16/2019), 2 mg (06/27/2019), 2 mg (06/30/2019), 2 mg (07/04/2019), 2 mg (07/07/2019), 2 mg (07/18/2019), 2 mg (07/21/2019), 2 mg (07/25/2019), 2 mg (07/28/2019), 2 mg (08/08/2019), 2 mg (08/11/2019), 2 mg (08/15/2019), 2 mg (08/18/2019), 2 mg (08/29/2019), 2 mg (09/01/2019), 2 mg (09/05/2019), 2 mg (09/08/2019), 2 mg (09/19/2019), 2 mg (10/04/2019)  for chemotherapy treatment.    Multiple myeloma in relapse ([place])  04/16/2021 Initial Diagnosis   Multiple myeloma in relapse ([place])   04/30/2021 - 04/15/2022 Chemotherapy   Patient is on Treatment Plan : MYELOMA RELAPSED/REFRACTORY KCd q28d       CURRENT THERAPY: Kyprolis and dexamethasone, started on 04/30/2021  INTERVAL HISTORY: KHLOE HUNKELE returns for continued evaluation and management of multiple myeloma. She notes no acute new bone pains.  No new fatigue.  No new toxicities from her carfilzomib. Her last labs show progression of her myeloma with an increase in her M spike to 1.3 g/dL suggesting progression on carfilzomib. We discussed different treatment options and after discussing the pros and cons she is agreeable with proceeding with daratumumab plus pomalidomide.  Labs done today and previously were discussed in detail with her.  ROS 10  Point review of Systems was done is negative except as noted above.  MEDICAL HISTORY:  Past Medical History:  Diagnosis Date   Anemia    Glaucoma    HYPERTENSION  03/11/2007   Multiple myeloma ([place])    OSTEOPENIA 03/11/2007   Pre-diabetes    Thyroid cancer ([place])    Thyroid disease    Tubular adenoma of colon 07/2015    SURGICAL HISTORY: Past Surgical History:  Procedure Laterality Date   BONE MARROW BIOPSY     multiple   BREAST EXCISIONAL BIOPSY Right 2000   BREAST LUMPECTOMY  1990   benign   CATARACT EXTRACTION Bilateral 2018   DILATION AND CURETTAGE OF UTERUS     bleeding at menopause. No uterine cancer   IR IMAGING GUIDED PORT INSERTION  05/16/2021   IR RADIOLOGIST EVAL & MGMT  12/13/2018   THYROIDECTOMY N/A 08/02/2020   Procedure: TOTAL THYROIDECTOMY;  Surgeon: [name], MD;  Location: [facility];  Service: General;  Laterality: N/A;   TONSILLECTOMY     age 17   I have reviewed the social history and family history with the patient and they are unchanged from previous note.  ALLERGIES:  is allergic to ace inhibitors, benadryl [diphenhydramine], diamox [acetazolamide], sulfamethoxazole, lenalidomide, and penicillins.  MEDICATIONS:  Current Outpatient Medications  Medication Sig Dispense Refill   acyclovir (ZOVIRAX) 400 MG tablet Take 1 tablet (400 mg total) by mouth 2 (two) times daily. 60 tablet 3   ALPHAGAN P 0.1 % SOLN Place 1 drop into both eyes in the morning, at noon, and at bedtime.      amLODipine (NORVASC) 10 MG tablet Take 1 tablet (10 mg total) by mouth daily. 90 tablet 3   aspirin EC 81 MG tablet Take 81 mg by mouth daily.     bimatoprost (LUMIGAN) 0.03 % ophthalmic solution Place 1 drop into both eyes at bedtime.      Cholecalciferol (VITAMIN D3) 125 MCG (5000 UT) TABS Take 5,000 Units by mouth daily.     Co-Enzyme Q-10 100 MG CAPS Take 100 mg by mouth daily.     dorzolamide-timolol (COSOPT) 22.3-6.8 MG/ML ophthalmic solution Place 1 drop into both eyes 2 (two) times daily.   11   fentaNYL (DURAGESIC) 12 MCG/HR Place 1 patch onto the skin every 3 (three) days. 10 patch 0   levothyroxine (SYNTHROID) 88 MCG tablet Take 1  tablet (88 mcg total) by mouth daily before breakfast. 90 tablet 3   lidocaine-prilocaine (EMLA) cream Apply 1 application topically as needed. Apply prior to port access. 30 g 0   LUMIGAN 0.01 % SOLN 1 drop at bedtime.     magnesium chloride (SLOW-MAG) 64 MG TBEC SR tablet Take by mouth.     Multiple Vitamins-Minerals (MULTIVITAMIN WITH MINERALS) tablet Take 1 tablet by mouth daily. Centrum Silver 50 +     Netarsudil-Latanoprost (ROCKLATAN) 0.02-0.005 % SOLN Place 1 drop into both eyes daily.     Omega-3 1000 MG CAPS Take 1,000 mg by mouth daily.      oxyCODONE-acetaminophen (PERCOCET) 5-325 MG tablet Take 1-2 tablets by mouth every 4 (four) hours as needed for moderate pain or severe pain. 90 tablet 0   polyethylene glycol (MIRALAX) packet Take 17 g by mouth daily. 30 each 1   senna-docusate (SENNA S) 8.6-50 MG tablet Take 2 tablets by mouth at bedtime. 60 tablet 3   Simethicone (GAS-X PO) Take 1 tablet by mouth as needed.     vitamin C (ASCORBIC ACID) 500  MG tablet Take 500 mg by mouth 2 (two) times a week.      No current facility-administered medications for this visit.    PHYSICAL EXAMINATION:. Vital signs reviewed NAD GENERAL:alert, in no acute distress and comfortable SKIN: no acute rashes, no significant lesions EYES: conjunctiva are pink and non-injected, sclera anicteric OROPHARYNX: MMM, no exudates, no oropharyngeal erythema or ulceration NECK: supple, no JVD LYMPH:  no palpable lymphadenopathy in the cervical, axillary or inguinal regions LUNGS: clear to auscultation b/l with normal respiratory effort HEART: regular rate & rhythm ABDOMEN:  normoactive bowel sounds , non tender, not distended. Extremity: no pedal edema PSYCH: alert & oriented x 3 with fluent speech NEURO: no focal motor/sensory deficits    LABORATORY DATA:   .    Latest Ref Rng & Units 04/29/2022    8:23 AM 04/15/2022    8:47 AM 04/08/2022    8:31 AM  CBC  WBC 4.0 - 10.5 K/uL 5.1  2.6  2.6    Hemoglobin 12.0 - 15.0 g/dL 9.9  9.6  9.9   Hematocrit 36.0 - 46.0 % 29.8  28.8  29.8   Platelets 150 - 400 K/uL 318  191  152       Latest Ref Rng & Units 04/29/2022    8:23 AM 04/15/2022    8:47 AM 04/08/2022    8:31 AM  CMP  Glucose 70 - 99 mg/dL 111  112  107   BUN 8 - 23 mg/dL _0 Creatinine 0.44 - 1.00 mg/dL 0.68  0.63  0.63   Sodium 135 - 145 mmol/L 139  139  139   Potassium 3.5 - 5.1 mmol/L 4.4  4.4  4.2   Chloride 98 - 111 mmol/L 107  106  105   CO2 22 - 32 mmol/L _1 Calcium 8.9 - 10.3 mg/dL 8.8  9.2  9.4   Total Protein 6.5 - 8.1 g/dL 7.4  7.0  7.2   Total Bilirubin 0.3 - 1.2 mg/dL 0.5  0.5  0.4   Alkaline Phos 38 - 126 U/L _2 AST 15 - 41 U/L _3 ALT 0 - 44 U/L _4 RADIOGRAPHIC STUDIES: I have personally reviewed the radiological images as listed and agreed with the findings in the report. No results found.   ASSESSMENT & PLAN:  MAEOLA MCHANEY is a 82 y.o. female who presents for a follow up for multiple myeloma.   1. 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  She achieved complete response with negative M protein and negative PET scan in December 2020. -She subsequently went on Ninlaro maintenance therapy -PET scan from May 08, 2021, which showed focal activity within the right femur, consistent with active multiple myeloma, no other hypermetabolic lesion or plasmacytoma. -05/06/2021 bone marrow biopsy, which showed 3% plasma cells. --Recommend Zometa every 2 months. PLAN Labs done today and previously were discussed in detail with the patient. Her myeloma seems to be progressing on carfilzomib with a bump in her M spike from 0.7 g/dL up to 1.3 g/dL We discussed that she will need to change her current treatment and we discussed different  treatment options and  she is agreeable with proceeding with daratumumab plus pomalidomide She will need to be on aspirin for VTE prophylaxis She will need to continue to be on acyclovir for VZV and HSV prophylaxis  2. Mild anemia -Secondary to carfilzomib and myeloma we will continue to monitor  3.  H/O constipation -Requiring ED visit on 12/26/2021 -Likely medication induced.  -Symptoms have improved and bowel habits are back to baseline. Recommend to continue current regimen of MiraLAX daily and Senna 2 capsules p.o. at bedtime.  4.  Cancer related pain  -Currently well controlled -Continue fentanyl patch at 12 mcg/h and Percocet for break through pain.   Follow-up Please schedule to start daratumumab in 1 week with port flush and labs.. Please schedule weekly daratumumab for 4 doses MD visit for toxicity check with cycle 1 day 8  The total time spent in the appointment was 31 minutes*.  All of the patient's questions were answered with apparent satisfaction. The patient knows to call the clinic with any problems, questions or concerns.   Sullivan Lone MD MS AAHIVMS [facility] Hematology/Oncology Physician [facility]  .*Total Encounter Time as defined by the Centers for Medicare and Medicaid Services includes, in addition to the face-to-face time of a patient visit (documented in the note above) non-face-to-face time: obtaining and reviewing outside history, ordering and reviewing medications, tests or procedures, care coordination (communications with other health care professionals or caregivers) and documentation in the medical record.

## 2022-05-06 NOTE — Telephone Encounter (Signed)
Oral Oncology Pharmacist Encounter   Received notification from [employer] [employer] that prior authorization for Pomalyst is required.   PA submitted on CoverMyMeds Key: [id number] Status is pending   Oral Oncology Clinic will continue to follow.   Leron Croak, PharmD, BCPS, [facility] Hematology/Oncology Clinical Pharmacist [facility] and [place] [phone] 05/06/2022 8:47 AM

## 2022-05-06 NOTE — Progress Notes (Signed)
DISCONTINUE ON PATHWAY REGIMEN - Multiple Myeloma and Other Plasma Cell Dyscrasias     Cycle 1: A cycle is 28 days:     Cyclophosphamide      Dexamethasone      Carfilzomib      Carfilzomib    Cycles 2 through 6: A cycle is every 28 days:     Cyclophosphamide      Dexamethasone      Carfilzomib   **Always confirm dose/schedule in your pharmacy ordering system**  REASON: Disease Progression PRIOR TREATMENT: [id number]: KCd (Carfilzomib 20/36 mg/m2 + Cyclophosphamide 500 mg PO + Dexamethasone 40 mg PO) q28 Days for up to 6 Cycles TREATMENT RESPONSE: Partial Response (PR)  START ON PATHWAY REGIMEN - Multiple Myeloma and Other Plasma Cell Dyscrasias     Cycles 1 and 2: A cycle is every 28 days:     Pomalidomide      Dexamethasone      Daratumumab    Cycles 3 through 6: A cycle is every 28 days:     Pomalidomide      Dexamethasone      Dexamethasone      Daratumumab    Cycles 7 and beyond: A cycle is every 28 days:     Pomalidomide      Dexamethasone      Dexamethasone      Daratumumab   **Always confirm dose/schedule in your pharmacy ordering system**  Patient Characteristics: Multiple Myeloma, Relapsed / Refractory, Second through Fourth Lines of Therapy, Fit or Candidate for Triplet Therapy, Lenalidomide-Refractory or Lenalidomide-based Regimen Not Preferred, Candidate for Anti-CD38 Antibody Disease Classification: Multiple Myeloma R-ISS Staging: Unknown Therapeutic Status: Relapsed Line of Therapy: Third Line Anti-CD38 Antibody Candidacy: Candidate for Anti-CD38 Antibody Lenalidomide-based Regimen Preference/Candidacy: Lenalidomide-based Regimen Not Preferred Intent of Therapy: Non-Curative / Palliative Intent, Discussed with Patient

## 2022-05-06 NOTE — Addendum Note (Signed)
Addended by: Sullivan Lone on: 05/06/2022 02:10 AM   Modules accepted: Orders

## 2022-05-06 NOTE — Telephone Encounter (Addendum)
Oral Oncology Pharmacist Encounter  Received new prescription for Pomalyst (pomalidomide) for the treatment of R/R multiple myeloma in conjunction with daratumumab and dexamethasone, planned duration until disease progression or unacceptable drug toxicity.  CBC w/ Diff and CMP from 04/29/22 assessed, no relevant lab abnormalities requiring baseline dose adjustment required at this time. Prescription dose and frequency assessed for appropriateness.   Current medication list in Epic reviewed, no relevant/significant DDIs with Pomalyst identified.  Evaluated chart and no patient barriers to medication adherence noted.   Once [employer] authorization number is obtained, prescription will need to be e-scribed to [facility] for dispensing.   Oral Oncology Clinic will continue to follow for insurance authorization, copayment issues, initial counseling and start date.  Leron Croak, PharmD, BCPS, [facility] Hematology/Oncology Clinical Pharmacist [facility] and [place] [phone] 05/06/2022 8:41 AM

## 2022-05-07 ENCOUNTER — Other Ambulatory Visit: Payer: Self-pay

## 2022-05-07 ENCOUNTER — Other Ambulatory Visit (HOSPITAL_COMMUNITY): Payer: Self-pay

## 2022-05-08 ENCOUNTER — Other Ambulatory Visit (HOSPITAL_COMMUNITY): Payer: Self-pay

## 2022-05-11 ENCOUNTER — Other Ambulatory Visit: Payer: Self-pay

## 2022-05-11 DIAGNOSIS — C9000 Multiple myeloma not having achieved remission: Secondary | ICD-10-CM

## 2022-05-11 MED FILL — Dexamethasone Sodium Phosphate Inj 100 MG/10ML: INTRAMUSCULAR | Qty: 2 | Status: AC

## 2022-05-12 ENCOUNTER — Inpatient Hospital Stay: Payer: Medicare HMO

## 2022-05-12 ENCOUNTER — Other Ambulatory Visit: Payer: Self-pay

## 2022-05-12 ENCOUNTER — Inpatient Hospital Stay: Payer: Medicare HMO | Attending: Hematology

## 2022-05-12 ENCOUNTER — Inpatient Hospital Stay: Payer: Medicare HMO | Admitting: Hematology

## 2022-05-12 ENCOUNTER — Other Ambulatory Visit: Payer: Self-pay | Admitting: Hematology

## 2022-05-12 ENCOUNTER — Other Ambulatory Visit: Payer: Self-pay | Admitting: *Deleted

## 2022-05-12 ENCOUNTER — Other Ambulatory Visit (HOSPITAL_COMMUNITY): Payer: Self-pay

## 2022-05-12 VITALS — BP 176/98 | HR 82 | Temp 97.9°F | Resp 16

## 2022-05-12 DIAGNOSIS — C9000 Multiple myeloma not having achieved remission: Secondary | ICD-10-CM

## 2022-05-12 DIAGNOSIS — C9002 Multiple myeloma in relapse: Secondary | ICD-10-CM

## 2022-05-12 DIAGNOSIS — Z5112 Encounter for antineoplastic immunotherapy: Secondary | ICD-10-CM | POA: Diagnosis present

## 2022-05-12 DIAGNOSIS — Z7189 Other specified counseling: Secondary | ICD-10-CM

## 2022-05-12 DIAGNOSIS — Z95828 Presence of other vascular implants and grafts: Secondary | ICD-10-CM

## 2022-05-12 LAB — TYPE AND SCREEN
ABO/RH(D): A POS
Antibody Screen: NEGATIVE

## 2022-05-12 LAB — CMP (CANCER CENTER ONLY)
ALT: 10 U/L (ref 0–44)
AST: 11 U/L — ABNORMAL LOW (ref 15–41)
Albumin: 4 g/dL (ref 3.5–5.0)
Alkaline Phosphatase: 27 U/L — ABNORMAL LOW (ref 38–126)
Anion gap: 4 — ABNORMAL LOW (ref 5–15)
BUN: 18 mg/dL (ref 8–23)
CO2: 28 mmol/L (ref 22–32)
Calcium: 8.9 mg/dL (ref 8.9–10.3)
Chloride: 105 mmol/L (ref 98–111)
Creatinine: 0.64 mg/dL (ref 0.44–1.00)
GFR, Estimated: >60 mL/min (ref >60)
Glucose, Bld: 105 mg/dL — ABNORMAL HIGH (ref 70–99)
Potassium: 4.3 mmol/L (ref 3.5–5.1)
Sodium: 137 mmol/L (ref 135–145)
Total Bilirubin: 0.3 mg/dL (ref 0.3–1.2)
Total Protein: 7.8 g/dL (ref 6.5–8.1)

## 2022-05-12 LAB — CBC WITH DIFFERENTIAL (CANCER CENTER ONLY)
Abs Immature Granulocytes: 0.01 10*3/uL (ref 0.00–0.07)
Basophils Absolute: 0.1 10*3/uL (ref 0.0–0.1)
Basophils Relative: 1 %
Eosinophils Absolute: 0.1 10*3/uL (ref 0.0–0.5)
Eosinophils Relative: 2 %
HCT: 30.1 % — ABNORMAL LOW (ref 36.0–46.0)
Hemoglobin: 10.1 g/dL — ABNORMAL LOW (ref 12.0–15.0)
Immature Granulocytes: 0 %
Lymphocytes Relative: 8 %
Lymphs Abs: 0.3 10*3/uL — ABNORMAL LOW (ref 0.7–4.0)
MCH: 35.7 pg — ABNORMAL HIGH (ref 26.0–34.0)
MCHC: 33.6 g/dL (ref 30.0–36.0)
MCV: 106.4 fL — ABNORMAL HIGH (ref 80.0–100.0)
Monocytes Absolute: 0.5 10*3/uL (ref 0.1–1.0)
Monocytes Relative: 12 %
Neutro Abs: 3.1 10*3/uL (ref 1.7–7.7)
Neutrophils Relative %: 77 %
Platelet Count: 202 10*3/uL (ref 150–400)
RBC: 2.83 MIL/uL — ABNORMAL LOW (ref 3.87–5.11)
RDW: 13.1 % (ref 11.5–15.5)
WBC Count: 4.1 10*3/uL (ref 4.0–10.5)
nRBC: 0 % (ref 0.0–0.2)

## 2022-05-12 LAB — PRETREATMENT RBC PHENOTYPE

## 2022-05-12 MED ORDER — SODIUM CHLORIDE 0.9 % IV SOLN
20.0000 mg | Freq: Once | INTRAVENOUS | Status: AC
  Administered 2022-05-12: 20 mg via INTRAVENOUS
  Filled 2022-05-12: qty 20

## 2022-05-12 MED ORDER — SODIUM CHLORIDE 0.9 % IV SOLN
16.0000 mg/kg | Freq: Once | INTRAVENOUS | Status: AC
  Administered 2022-05-12: 700 mg via INTRAVENOUS
  Filled 2022-05-12: qty 20

## 2022-05-12 MED ORDER — MONTELUKAST SODIUM 10 MG PO TABS
10.0000 mg | ORAL_TABLET | Freq: Once | ORAL | Status: AC
  Administered 2022-05-12: 10 mg via ORAL
  Filled 2022-05-12: qty 1

## 2022-05-12 MED ORDER — POMALIDOMIDE 2 MG PO CAPS: 2.0000 mg | ORAL_CAPSULE | Freq: Every day | ORAL | 0 refills | Status: DC

## 2022-05-12 MED ORDER — HYDROXYZINE HCL 25 MG PO TABS
25.0000 mg | ORAL_TABLET | Freq: Once | ORAL | Status: AC
  Administered 2022-05-12: 25 mg via ORAL
  Filled 2022-05-12: qty 1

## 2022-05-12 MED ORDER — HEPARIN SOD (PORK) LOCK FLUSH 100 UNIT/ML IV SOLN
500.0000 [IU] | Freq: Once | INTRAVENOUS | Status: AC | PRN
  Administered 2022-05-12: 500 [IU]

## 2022-05-12 MED ORDER — FAMOTIDINE IN NACL 20-0.9 MG/50ML-% IV SOLN
20.0000 mg | Freq: Once | INTRAVENOUS | Status: AC
  Administered 2022-05-12: 20 mg via INTRAVENOUS
  Filled 2022-05-12: qty 50

## 2022-05-12 MED ORDER — SODIUM CHLORIDE 0.9 % IV SOLN: Freq: Once | INTRAVENOUS | Status: AC

## 2022-05-12 MED ORDER — ACETAMINOPHEN 325 MG PO TABS
650.0000 mg | ORAL_TABLET | Freq: Once | ORAL | Status: AC
  Administered 2022-05-12: 650 mg via ORAL
  Filled 2022-05-12: qty 2

## 2022-05-12 MED ORDER — SODIUM CHLORIDE 0.9% FLUSH
10.0000 mL | Freq: Once | INTRAVENOUS | Status: AC
  Administered 2022-05-12: 10 mL

## 2022-05-12 MED ORDER — SODIUM CHLORIDE 0.9% FLUSH
10.0000 mL | INTRAVENOUS | Status: DC | PRN
  Administered 2022-05-12: 10 mL

## 2022-05-12 NOTE — Patient Instructions (Signed)
[facility]COL[facility]   Discharge Instructions: Thank you for choosing [facility] to provide your oncology and hematology care.   If you have a lab appointment with the Cancer Center, please go directly to the Cancer Center and check in at the registration area.   Wear comfortable clothing and clothing appropriate for easy access to any Portacath or PICC line.   We strive to give you quality time with your provider. You may need to reschedule your appointment if you arrive late (15 or more minutes).  Arriving late affects you and other patients whose appointments are after yours.  Also, if you miss three or more appointments without notifying the office, you may be dismissed from the clinic at the provider's discretion.      For prescription refill requests, have your pharmacy contact our office and allow 72 hours for refills to be completed.    Today you received the following chemotherapy and/or immunotherapy agents: Daratumumab (Darzalex)       To help prevent nausea and vomiting after your treatment, we encourage you to take your nausea medication as directed.  BELOW ARE SYMPTOMS THAT SHOULD BE REPORTED IMMEDIATELY: *FEVER GREATER THAN 100.4 F (38 C) OR HIGHER *CHILLS OR SWEATING *NAUSEA AND VOMITING THAT IS NOT CONTROLLED WITH YOUR NAUSEA MEDICATION *UNUSUAL SHORTNESS OF BREATH *UNUSUAL BRUISING OR BLEEDING *URINARY PROBLEMS (pain or burning when urinating, or frequent urination) *BOWEL PROBLEMS (unusual diarrhea, constipation, pain near the anus) TENDERNESS IN MOUTH AND THROAT WITH OR WITHOUT PRESENCE OF ULCERS (sore throat, sores in mouth, or a toothache) UNUSUAL RASH, SWELLING OR PAIN  UNUSUAL VAGINAL DISCHARGE OR ITCHING   Items with * indicate a potential emergency and should be followed up as soon as possible or go to the Emergency Department if any problems should occur.  Please show the CHEMOTHERAPY ALERT CARD or IMMUNOTHERAPY ALERT CARD  at check-in to the Emergency Department and triage nurse.  Should you have questions after your visit or need to cancel or reschedule your appointment, please contact [facility] MEDICAL ONCOLOG[facility]  Dept: [phone]  and follow the prompts.  Office hours are 8:00 a.m. to 4:30 p.m. Monday - Friday. Please note that voicemails left after 4:00 p.m. may not be returned until the following business day.  We are closed weekends and major holidays. You have access to a nurse at all times for urgent questions. Please call the main number to the clinic Dept: [phone] and follow the prompts.   For any non-urgent questions, you may also contact your provider using MyChart. We now offer e-Visits for anyone 18 and older to request care online for non-urgent symptoms. For details visit [email or web address].   Also download the MyChart app! Go to the app store, search "MyChart", open the app, select [place], and log in with your MyChart username and password.  Masks are optional in the cancer centers. If you would like for your care team to wear a mask while they are taking care of you, please let them know. You may have one support person who is at least 81 years old accompany you for your appointments. 

## 2022-05-12 NOTE — Progress Notes (Signed)
Per Dr. Irene Limbo - okay to proceed with elevated BP of 180/82. Confirmed with Heather in lab that phenotype and type and screen were in process prior to initiation of daratumumab treatment.

## 2022-05-13 NOTE — Telephone Encounter (Signed)
Oral Chemotherapy Pharmacist Encounter  I spoke with patient for overview of: Pomalyst (pomalidomide) for the treatment of relapsed multiple myeloma in conjunction with daratumumab and dexamethasone, planned duration until disease progression or unacceptable toxicity.   Counseled patient on administration, dosing, side effects, monitoring, drug-food interactions, safe handling, storage, and disposal.  Patient will take Pomalyst 28m capsules, 1 capsule by mouth once daily, without regard to food, with a full glass of water.  Pomalyst will be given 21 days on, 7 days off, repeat every 28 days.  Pomalyst start date: confirmed with Dr. KIrene Limbothat patient will start Pomalyst once received from [facility] (expect in the next 1-2 business days), but will take last dose of cycle 1 on 06/01/22 (Day 1 of cycle 1 started 05/12/22) and then resume day 1 of cycle 2 on 06/09/22.  Adverse effects of Pomalyst include but are not limited to: nausea, constipation, diarrhea, abdominal pain, rash, fatigue, peripheral edema, and decreased blood counts.    Reviewed with patient importance of keeping a medication schedule and plan for any missed doses. No barriers to medication adherence identified.  Medication reconciliation performed and medication/allergy list updated.   Patient counseled on importance of daily aspirin 847mfor VTE prophylaxis.  Insurance authorization for [employer] been obtained.  Pomalyst prescription is being dispensed from [facility] it is a limited distribution medication. Patient provided with [facility] phone number in the even she does not receive a phone call from them on 05/13/22 (tele: [phone] All questions answered.  Ms. PaCadenasoiced understanding and appreciation.   Medication education handout placed in mail for patient. Patient knows to call the office with questions or concerns. Oral Chemotherapy Clinic phone number provided to patient.   ReLeron CroakPharmD, BCPS, BCOP Hematology/Oncology Clinical Pharmacist [facility] [phone]/06/2022 12:11 PM

## 2022-05-15 ENCOUNTER — Other Ambulatory Visit: Payer: Self-pay | Admitting: *Deleted

## 2022-05-15 ENCOUNTER — Telehealth: Payer: Self-pay | Admitting: Pharmacist

## 2022-05-15 DIAGNOSIS — C9000 Multiple myeloma not having achieved remission: Secondary | ICD-10-CM

## 2022-05-15 NOTE — Progress Notes (Signed)
Chronic Care Management Pharmacy Assistant   Name: Megan Olson  MRN: [id number] DOB: 07-25-1941   Reason for Encounter: Hypertension Adherence Call    Recent office visits:  None  Recent consult visits:  05/12/2022 OV (Oncology) [name], MD; cancelled.  04/29/2022 OV (Oncology) [name], MD; Please schedule to start daratumumab in 1 week with port flush and labs.. Please schedule weekly daratumumab for 4 doses MD visit for toxicity check with cycle 1 day 8  04/20/2022 OV (Ophthalmology) Edilia Bo, [name], MD;  Alphagan P x3 OU  Dorzolamide-Timolol x2 OU Continue Rocklatan x1 OU qhs Recommend ATs prn   04/01/2022 OV (Oncology) [name], MD; We will schedule treatment according to lab results today  03/23/2022 OV (Ophthalmology) Edilia Bo, [name], MD; Alphagan P x3 OU  Dorzolamide-Timolol x2 OU Stop Lumigan x1 OU qhs Start Rocklatan x1 OU qhs Recommend ATs prn   Hospital visits:  None since last adherence call  Medications: Outpatient Encounter Medications as of 05/15/2022  Medication Sig Note   acyclovir (ZOVIRAX) 400 MG tablet Take 1 tablet (400 mg total) by mouth 2 (two) times daily.    acyclovir (ZOVIRAX) 400 MG tablet Take 1 tablet (400 mg total) by mouth 2 (two) times daily.    ALPHAGAN P 0.1 % SOLN Place 1 drop into both eyes in the morning, at noon, and at bedtime.     amLODipine (NORVASC) 10 MG tablet Take 1 tablet (10 mg total) by mouth daily.    aspirin EC 81 MG tablet Take 81 mg by mouth daily.    bimatoprost (LUMIGAN) 0.03 % ophthalmic solution Place 1 drop into both eyes at bedtime.     Cholecalciferol (VITAMIN D3) 125 MCG (5000 UT) TABS Take 5,000 Units by mouth daily.    Co-Enzyme Q-10 100 MG CAPS Take 100 mg by mouth daily.    dexamethasone (DECADRON) 4 MG tablet Take 5 tablets (20 mg total) by mouth daily. Take the day after daratumumab. Take with breakfast    dorzolamide-timolol (COSOPT) 22.3-6.8 MG/ML  ophthalmic solution Place 1 drop into both eyes 2 (two) times daily.     fentaNYL (DURAGESIC) 12 MCG/HR Place 1 patch onto the skin every 3 (three) days.    levothyroxine (SYNTHROID) 88 MCG tablet Take 1 tablet (88 mcg total) by mouth daily before breakfast.    lidocaine-prilocaine (EMLA) cream Apply 1 application topically as needed. Apply prior to port access.    lidocaine-prilocaine (EMLA) cream Apply to affected area once    LUMIGAN 0.01 % SOLN 1 drop at bedtime.    magnesium chloride (SLOW-MAG) 64 MG TBEC SR tablet Take by mouth.    Multiple Vitamins-Minerals (MULTIVITAMIN WITH MINERALS) tablet Take 1 tablet by mouth daily. Centrum Silver 50 +    Netarsudil-Latanoprost (ROCKLATAN) 0.02-0.005 % SOLN Place 1 drop into both eyes daily.    Omega-3 1000 MG CAPS Take 1,000 mg by mouth daily.     ondansetron (ZOFRAN) 8 MG tablet Take 1 tablet (8 mg total) by mouth every 8 (eight) hours as needed for nausea or vomiting.    oxyCODONE-acetaminophen (PERCOCET) 5-325 MG tablet Take 1-2 tablets by mouth every 4 (four) hours as needed for moderate pain or severe pain.    polyethylene glycol (MIRALAX) packet Take 17 g by mouth daily.    pomalidomide (POMALYST) 2 MG capsule Take 1 capsule (2 mg total) by mouth daily. Take for 21 days on, 7 days off, repeat every 28 days  prochlorperazine (COMPAZINE) 10 MG tablet Take 1 tablet (10 mg total) by mouth every 6 (six) hours as needed for nausea or vomiting.    senna-docusate (SENNA S) 8.6-50 MG tablet Take 2 tablets by mouth at bedtime.    Simethicone (GAS-X PO) Take 1 tablet by mouth as needed.    vitamin C (ASCORBIC ACID) 500 MG tablet Take 500 mg by mouth 2 (two) times a week.  10/17/2019: Taking every other day   No facility-administered encounter medications on file as of 05/15/2022.   Reviewed chart prior to disease state call. Spoke with patient regarding BP  Recent Office Vitals: BP Readings from Last 3 Encounters:  05/12/22 (!) 176/98  04/29/22 (!)  178/78  04/15/22 (!) 173/81   Pulse Readings from Last 3 Encounters:  05/12/22 82  04/29/22 (!) 58  04/15/22 61    Wt Readings from Last 3 Encounters:  04/29/22 99 lb 12 oz (45.2 kg)  04/15/22 100 lb 12 oz (45.7 kg)  04/08/22 99 lb 1.9 oz (45 kg)     Kidney Function Lab Results  Component Value Date/Time   CREATININE 0.64 05/12/2022 08:33 AM   CREATININE 0.68 04/29/2022 08:23 AM   GFR 83.73 11/28/2018 03:48 PM   GFRNONAA >60 05/12/2022 08:33 AM   GFRAA >60 06/13/2020 11:12 AM       Latest Ref Rng & Units 05/12/2022    8:33 AM 04/29/2022    8:23 AM 04/15/2022    8:47 AM  BMP  Glucose 70 - 99 mg/dL 105  111  112   BUN 8 - 23 mg/dL '18  17  15   '$ Creatinine 0.44 - 1.00 mg/dL 0.64  0.68  0.63   Sodium 135 - 145 mmol/L 137  139  139   Potassium 3.5 - 5.1 mmol/L 4.3  4.4  4.4   Chloride 98 - 111 mmol/L 105  107  106   CO2 22 - 32 mmol/L '28  27  28   '$ Calcium 8.9 - 10.3 mg/dL 8.9  8.8  9.2     Current antihypertensive regimen:  Amlodipine 10 mg daily  How often are you checking your Blood Pressure? 3-5x per week  Current home BP readings: 130/79  What recent interventions/DTPs have been made by any provider to improve Blood Pressure control since last CPP Visit: No recent interventions or DTPs.  Any recent hospitalizations or ED visits since last visit with CPP? No  What diet changes have been made to improve Blood Pressure Control?  Patient states she is eating a healthy diet.  What exercise is being done to improve your Blood Pressure Control?  Patient states she is not exercising at this time.  Adherence Review: Is the patient currently on ACE/ARB medication? No Does the patient have >5 day gap between last estimated fill dates? No  Patient states she is currently doing both chemotherapy and immunotherapy. Patient states she has been feeling tired lately and she takes a lot of naps.  Care Gaps: Medicare Annual Wellness: Completed 10/28/2021 Hemoglobin A1C: 5.0% on  01/28/2022 Colonoscopy: Aged out Dexa Scan: Completed Mammogram: Last completed 02/08/2018  Future Appointments  Date Time Provider [place]  05/19/2022  8:45 AM [facility] [place] None  05/19/2022  9:00 AM [name], MD [facility] None  05/19/2022 10:00 AM [facility] INFUSION [facility] None  05/26/2022  9:00 AM [facility] [place] FLUSH [facility] None  05/26/2022 10:00 AM [facility] INFUSION [facility] None  06/02/2022  9:00 AM [facility] [place] FLUSH [facility] None  06/02/2022  9:30 AM [name], MD [facility] None  06/02/2022 10:00 AM [facility] INFUSION [facility] None  11/12/2022  8:45 AM [facility] HEALTH COACH [facility]EC  12/07/2022  3:45 PM [facility] CCM PHARMACIST [facility]  01/26/2023  9:00 AM [name], MD [facility] None  02/08/2023  9:00 AM [name], MD [facility]    Star Rating Drugs: None  April D Calhoun, [place] Pharmacist Assistant [phone]

## 2022-05-18 ENCOUNTER — Other Ambulatory Visit: Payer: Self-pay

## 2022-05-18 DIAGNOSIS — C9000 Multiple myeloma not having achieved remission: Secondary | ICD-10-CM

## 2022-05-18 MED ORDER — FENTANYL 12 MCG/HR TD PT72: 1.0000 | MEDICATED_PATCH | TRANSDERMAL | 0 refills | Status: DC

## 2022-05-18 MED FILL — Dexamethasone Sodium Phosphate Inj 100 MG/10ML: INTRAMUSCULAR | Qty: 2 | Status: AC

## 2022-05-19 ENCOUNTER — Inpatient Hospital Stay: Payer: Medicare HMO

## 2022-05-19 ENCOUNTER — Inpatient Hospital Stay (HOSPITAL_BASED_OUTPATIENT_CLINIC_OR_DEPARTMENT_OTHER): Payer: Medicare HMO | Admitting: Hematology

## 2022-05-19 ENCOUNTER — Other Ambulatory Visit: Payer: Self-pay | Admitting: Hematology

## 2022-05-19 ENCOUNTER — Other Ambulatory Visit: Payer: Self-pay

## 2022-05-19 VITALS — BP 185/85 | HR 62 | Temp 98.1°F | Resp 14 | Ht 60.0 in | Wt 102.3 lb

## 2022-05-19 VITALS — BP 167/88 | HR 77 | Temp 98.1°F | Resp 18

## 2022-05-19 DIAGNOSIS — G893 Neoplasm related pain (acute) (chronic): Secondary | ICD-10-CM | POA: Diagnosis not present

## 2022-05-19 DIAGNOSIS — Z5111 Encounter for antineoplastic chemotherapy: Secondary | ICD-10-CM | POA: Diagnosis not present

## 2022-05-19 DIAGNOSIS — C9002 Multiple myeloma in relapse: Secondary | ICD-10-CM | POA: Diagnosis not present

## 2022-05-19 DIAGNOSIS — Z95828 Presence of other vascular implants and grafts: Secondary | ICD-10-CM

## 2022-05-19 DIAGNOSIS — Z7189 Other specified counseling: Secondary | ICD-10-CM

## 2022-05-19 DIAGNOSIS — C9000 Multiple myeloma not having achieved remission: Secondary | ICD-10-CM

## 2022-05-19 DIAGNOSIS — Z5112 Encounter for antineoplastic immunotherapy: Secondary | ICD-10-CM | POA: Diagnosis not present

## 2022-05-19 LAB — CBC WITH DIFFERENTIAL (CANCER CENTER ONLY)
Abs Immature Granulocytes: 0.02 10*3/uL (ref 0.00–0.07)
Basophils Absolute: 0 10*3/uL (ref 0.0–0.1)
Basophils Relative: 1 %
Eosinophils Absolute: 0.2 10*3/uL (ref 0.0–0.5)
Eosinophils Relative: 4 %
HCT: 30.7 % — ABNORMAL LOW (ref 36.0–46.0)
Hemoglobin: 10.3 g/dL — ABNORMAL LOW (ref 12.0–15.0)
Immature Granulocytes: 1 %
Lymphocytes Relative: 5 %
Lymphs Abs: 0.2 10*3/uL — ABNORMAL LOW (ref 0.7–4.0)
MCH: 35.6 pg — ABNORMAL HIGH (ref 26.0–34.0)
MCHC: 33.6 g/dL (ref 30.0–36.0)
MCV: 106.2 fL — ABNORMAL HIGH (ref 80.0–100.0)
Monocytes Absolute: 0.5 10*3/uL (ref 0.1–1.0)
Monocytes Relative: 13 %
Neutro Abs: 3 10*3/uL (ref 1.7–7.7)
Neutrophils Relative %: 76 %
Platelet Count: 236 10*3/uL (ref 150–400)
RBC: 2.89 MIL/uL — ABNORMAL LOW (ref 3.87–5.11)
RDW: 13.1 % (ref 11.5–15.5)
WBC Count: 3.8 10*3/uL — ABNORMAL LOW (ref 4.0–10.5)
nRBC: 0 % (ref 0.0–0.2)

## 2022-05-19 LAB — CMP (CANCER CENTER ONLY)
ALT: 13 U/L (ref 0–44)
AST: 12 U/L — ABNORMAL LOW (ref 15–41)
Albumin: 3.7 g/dL (ref 3.5–5.0)
Alkaline Phosphatase: 26 U/L — ABNORMAL LOW (ref 38–126)
Anion gap: 5 (ref 5–15)
BUN: 17 mg/dL (ref 8–23)
CO2: 26 mmol/L (ref 22–32)
Calcium: 9.3 mg/dL (ref 8.9–10.3)
Chloride: 107 mmol/L (ref 98–111)
Creatinine: 0.6 mg/dL (ref 0.44–1.00)
GFR, Estimated: >60 mL/min (ref >60)
Glucose, Bld: 90 mg/dL (ref 70–99)
Potassium: 4.1 mmol/L (ref 3.5–5.1)
Sodium: 138 mmol/L (ref 135–145)
Total Bilirubin: 0.3 mg/dL (ref 0.3–1.2)
Total Protein: 7.2 g/dL (ref 6.5–8.1)

## 2022-05-19 MED ORDER — SODIUM CHLORIDE 0.9 % IV SOLN
16.0000 mg/kg | Freq: Once | INTRAVENOUS | Status: AC
  Administered 2022-05-19: 700 mg via INTRAVENOUS
  Filled 2022-05-19: qty 20

## 2022-05-19 MED ORDER — SODIUM CHLORIDE 0.9 % IV SOLN: Freq: Once | INTRAVENOUS | Status: AC

## 2022-05-19 MED ORDER — ACETAMINOPHEN 325 MG PO TABS
ORAL_TABLET | ORAL | Status: AC
  Filled 2022-05-19: qty 2

## 2022-05-19 MED ORDER — FAMOTIDINE IN NACL 20-0.9 MG/50ML-% IV SOLN
20.0000 mg | Freq: Once | INTRAVENOUS | Status: AC
  Administered 2022-05-19: 20 mg via INTRAVENOUS

## 2022-05-19 MED ORDER — FAMOTIDINE IN NACL 20-0.9 MG/50ML-% IV SOLN
INTRAVENOUS | Status: AC
  Filled 2022-05-19: qty 50

## 2022-05-19 MED ORDER — SODIUM CHLORIDE 0.9% FLUSH
10.0000 mL | Freq: Once | INTRAVENOUS | Status: AC
  Administered 2022-05-19: 10 mL

## 2022-05-19 MED ORDER — SODIUM CHLORIDE 0.9% FLUSH
10.0000 mL | INTRAVENOUS | Status: DC | PRN
  Administered 2022-05-19: 10 mL

## 2022-05-19 MED ORDER — MONTELUKAST SODIUM 10 MG PO TABS
ORAL_TABLET | ORAL | Status: AC
  Filled 2022-05-19: qty 1

## 2022-05-19 MED ORDER — ACETAMINOPHEN 325 MG PO TABS
650.0000 mg | ORAL_TABLET | Freq: Once | ORAL | Status: AC
  Administered 2022-05-19: 650 mg via ORAL

## 2022-05-19 MED ORDER — SODIUM CHLORIDE 0.9 % IV SOLN
20.0000 mg | Freq: Once | INTRAVENOUS | Status: AC
  Administered 2022-05-19: 20 mg via INTRAVENOUS
  Filled 2022-05-19: qty 20

## 2022-05-19 MED ORDER — HYDROXYZINE HCL 25 MG PO TABS
25.0000 mg | ORAL_TABLET | Freq: Once | ORAL | Status: AC
  Administered 2022-05-19: 25 mg via ORAL
  Filled 2022-05-19: qty 1

## 2022-05-19 MED ORDER — MONTELUKAST SODIUM 10 MG PO TABS
10.0000 mg | ORAL_TABLET | Freq: Once | ORAL | Status: AC
  Administered 2022-05-19: 10 mg via ORAL

## 2022-05-19 MED ORDER — HEPARIN SOD (PORK) LOCK FLUSH 100 UNIT/ML IV SOLN
500.0000 [IU] | Freq: Once | INTRAVENOUS | Status: AC | PRN
  Administered 2022-05-19: 500 [IU]

## 2022-05-19 NOTE — Progress Notes (Signed)
Ok to treat with elevated BP today per Dr Irene Limbo.

## 2022-05-20 LAB — KAPPA/LAMBDA LIGHT CHAINS
Kappa free light chain: 5.2 mg/L (ref 3.3–19.4)
Kappa, lambda light chain ratio: 0.03 — ABNORMAL LOW (ref 0.26–1.65)
Lambda free light chains: 158.5 mg/L — ABNORMAL HIGH (ref 5.7–26.3)

## 2022-05-21 LAB — MULTIPLE MYELOMA PANEL, SERUM
Albumin SerPl Elph-Mcnc: 3.7 g/dL (ref 2.9–4.4)
Albumin/Glob SerPl: 1.2 (ref 0.7–1.7)
Alpha 1: 0.2 g/dL (ref 0.0–0.4)
Alpha2 Glob SerPl Elph-Mcnc: 0.6 g/dL (ref 0.4–1.0)
B-Globulin SerPl Elph-Mcnc: 2.2 g/dL — ABNORMAL HIGH (ref 0.7–1.3)
Gamma Glob SerPl Elph-Mcnc: 0.1 g/dL — ABNORMAL LOW (ref 0.4–1.8)
Globulin, Total: 3.1 g/dL (ref 2.2–3.9)
IgA: 7 mg/dL — ABNORMAL LOW (ref 64–422)
IgG (Immunoglobin G), Serum: 2057 mg/dL — ABNORMAL HIGH (ref 586–1602)
IgM (Immunoglobulin M), Srm: 5 mg/dL — ABNORMAL LOW (ref 26–217)
M Protein SerPl Elph-Mcnc: 1.4 g/dL — ABNORMAL HIGH
Total Protein ELP: 6.8 g/dL (ref 6.0–8.5)

## 2022-05-22 ENCOUNTER — Other Ambulatory Visit (HOSPITAL_COMMUNITY): Payer: Self-pay

## 2022-05-25 ENCOUNTER — Encounter: Payer: Self-pay | Admitting: Hematology

## 2022-05-25 ENCOUNTER — Other Ambulatory Visit (HOSPITAL_COMMUNITY): Payer: Self-pay

## 2022-05-25 MED FILL — Dexamethasone Sodium Phosphate Inj 100 MG/10ML: INTRAMUSCULAR | Qty: 2 | Status: AC

## 2022-05-25 NOTE — Progress Notes (Signed)
[place]   [phone]) [phone] [phone]   Clinic Follow up Note   Patient Care Team: [name], MD as PCP - General (Family Medicine) [name], MD as Consulting Physician (Hematology) Bond, [name], MD as Referring Physician (Ophthalmology) [name], [name], MD as Consulting Physician (Hematology and Oncology) [name], MD as Consulting Physician (Internal Medicine) [name], MD as Consulting Physician (General Surgery) [name], [facility] as Pharmacist (Pharmacist)  DOS 05/19/2022   CHIEF COMPLAINT:  Follow-up for continued evaluation and management of multiple myeloma   SUMMARY OF ONCOLOGIC HISTORY: Oncology History  Multiple myeloma not having achieved remission ([place])  12/12/2018 Initial Diagnosis   Multiple myeloma not having achieved remission ([place])   12/20/2018 - 10/04/2019 Chemotherapy   The patient had dexamethasone (DECADRON) tablet 20 mg, 20 mg (100 % of original dose 20 mg), Oral, Once, 14 of 14 cycles Dose modification: 20 mg (original dose 20 mg, Cycle 1), 10 mg (original dose 20 mg, Cycle 14) Administration: 20 mg (12/20/2018), 20 mg (12/27/2018), 20 mg (01/10/2019), 20 mg (01/17/2019), 20 mg (01/31/2019), 20 mg (02/07/2019), 20 mg (03/14/2019), 20 mg (03/21/2019), 20 mg (02/21/2019), 20 mg (02/28/2019), 20 mg (04/04/2019), 20 mg (04/11/2019), 20 mg (04/25/2019), 20 mg (05/02/2019), 20 mg (05/16/2019), 20 mg (05/23/2019), 20 mg (06/06/2019), 20 mg (06/13/2019), 20 mg (06/27/2019), 20 mg (07/04/2019), 20 mg (07/18/2019), 20 mg (07/25/2019), 20 mg (08/08/2019), 20 mg (08/15/2019), 20 mg (08/29/2019), 20 mg (09/05/2019), 10 mg (09/19/2019) lenalidomide (REVLIMID) 15 MG capsule, 1 of 1 cycle, Start date: 01/18/2019, End date: 02/21/2019 bortezomib SQ (VELCADE) chemo injection 2 mg, 1.3 mg/m2 = 2 mg, Subcutaneous,  Once, 14 of 14 cycles Administration: 2 mg (12/20/2018), 2 mg (12/27/2018), 2 mg (12/23/2018), 2 mg (12/30/2018), 2  mg (01/10/2019), 2 mg (01/17/2019), 2 mg (01/13/2019), 2 mg (01/20/2019), 2 mg (01/31/2019), 2 mg (02/07/2019), 2 mg (02/03/2019), 2 mg (02/10/2019), 2 mg (03/14/2019), 2 mg (03/17/2019), 2 mg (03/21/2019), 2 mg (03/24/2019), 2 mg (02/21/2019), 2 mg (02/24/2019), 2 mg (02/28/2019), 2 mg (03/03/2019), 2 mg (04/04/2019), 2 mg (04/06/2019), 2 mg (04/11/2019), 2 mg (04/14/2019), 2 mg (04/25/2019), 2 mg (04/28/2019), 2 mg (05/02/2019), 2 mg (05/05/2019), 2 mg (05/16/2019), 2 mg (05/19/2019), 2 mg (05/23/2019), 2 mg (05/26/2019), 2 mg (06/06/2019), 2 mg (06/09/2019), 2 mg (06/13/2019), 2 mg (06/16/2019), 2 mg (06/27/2019), 2 mg (06/30/2019), 2 mg (07/04/2019), 2 mg (07/07/2019), 2 mg (07/18/2019), 2 mg (07/21/2019), 2 mg (07/25/2019), 2 mg (07/28/2019), 2 mg (08/08/2019), 2 mg (08/11/2019), 2 mg (08/15/2019), 2 mg (08/18/2019), 2 mg (08/29/2019), 2 mg (09/01/2019), 2 mg (09/05/2019), 2 mg (09/08/2019), 2 mg (09/19/2019), 2 mg (10/04/2019)  for chemotherapy treatment.    Multiple myeloma in relapse ([place])  04/16/2021 Initial Diagnosis   Multiple myeloma in relapse ([place])   04/30/2021 - 04/15/2022 Chemotherapy   Patient is on Treatment Plan : MYELOMA RELAPSED/REFRACTORY KCd q28d     05/12/2022 -  Chemotherapy   Patient is on Treatment Plan : MYELOMA Daratumumab + Pomalidomide + Dexamethasone q28d x 7 cycles       CURRENT THERAPY: Kyprolis and dexamethasone, started on 04/30/2021  INTERVAL HISTORY:  Megan Olson returns for continued evaluation and management of multiple myeloma.  She reports no acute new symptoms at this time. No fevers no chills no night sweats.  No focal new bone pains.  No significant new fatigue.. No notable toxicities from her current daratumumab plus pomalidomide. Labs done today were reviewed in detail with the patient  MEDICAL HISTORY:  Past Medical History:  Diagnosis Date   Anemia    Glaucoma    HYPERTENSION 03/11/2007   Multiple myeloma ([place])    OSTEOPENIA 03/11/2007   Pre-diabetes    Thyroid cancer ([place])    Thyroid  disease    Tubular adenoma of colon 07/2015    SURGICAL HISTORY: Past Surgical History:  Procedure Laterality Date   BONE MARROW BIOPSY     multiple   BREAST EXCISIONAL BIOPSY Right 2000   BREAST LUMPECTOMY  1990   benign   CATARACT EXTRACTION Bilateral 2018   DILATION AND CURETTAGE OF UTERUS     bleeding at menopause. No uterine cancer   IR IMAGING GUIDED PORT INSERTION  05/16/2021   IR RADIOLOGIST EVAL & MGMT  12/13/2018   THYROIDECTOMY N/A 08/02/2020   Procedure: TOTAL THYROIDECTOMY;  Surgeon: [name], MD;  Location: [facility];  Service: General;  Laterality: N/A;   TONSILLECTOMY     age 26   I have reviewed the social history and family history with the patient and they are unchanged from previous note.  ALLERGIES:  is allergic to ace inhibitors, benadryl [diphenhydramine], diamox [acetazolamide], sulfamethoxazole, lenalidomide, and penicillins.  MEDICATIONS:  Current Outpatient Medications  Medication Sig Dispense Refill   acyclovir (ZOVIRAX) 400 MG tablet Take 1 tablet (400 mg total) by mouth 2 (two) times daily. 60 tablet 3   acyclovir (ZOVIRAX) 400 MG tablet Take 1 tablet (400 mg total) by mouth 2 (two) times daily. 60 tablet 11   ALPHAGAN P 0.1 % SOLN Place 1 drop into both eyes in the morning, at noon, and at bedtime.      amLODipine (NORVASC) 10 MG tablet Take 1 tablet (10 mg total) by mouth daily. 90 tablet 3   aspirin EC 81 MG tablet Take 81 mg by mouth daily.     bimatoprost (LUMIGAN) 0.03 % ophthalmic solution Place 1 drop into both eyes at bedtime.      Cholecalciferol (VITAMIN D3) 125 MCG (5000 UT) TABS Take 5,000 Units by mouth daily.     Co-Enzyme Q-10 100 MG CAPS Take 100 mg by mouth daily.     dexamethasone (DECADRON) 4 MG tablet Take 5 tablets (20 mg total) by mouth daily. Take the day after daratumumab. Take with breakfast 20 tablet 11   dorzolamide-timolol (COSOPT) 22.3-6.8 MG/ML ophthalmic solution Place 1 drop into both eyes 2 (two) times daily.   11    fentaNYL (DURAGESIC) 12 MCG/HR Place 1 patch onto the skin every 3 (three) days. 10 patch 0   levothyroxine (SYNTHROID) 88 MCG tablet Take 1 tablet (88 mcg total) by mouth daily before breakfast. 90 tablet 3   lidocaine-prilocaine (EMLA) cream Apply 1 application topically as needed. Apply prior to port access. 30 g 0   lidocaine-prilocaine (EMLA) cream Apply to affected area once 30 g 3   LUMIGAN 0.01 % SOLN 1 drop at bedtime.     magnesium chloride (SLOW-MAG) 64 MG TBEC SR tablet Take by mouth.     Multiple Vitamins-Minerals (MULTIVITAMIN WITH MINERALS) tablet Take 1 tablet by mouth daily. Centrum Silver 50 +     Netarsudil-Latanoprost (ROCKLATAN) 0.02-0.005 % SOLN Place 1 drop into both eyes daily.     Omega-3 1000 MG CAPS Take 1,000 mg by mouth daily.      ondansetron (ZOFRAN) 8 MG tablet Take 1 tablet (8 mg total) by mouth every 8 (eight) hours as needed for nausea or vomiting. 30 tablet 1   oxyCODONE-acetaminophen (PERCOCET)  5-325 MG tablet Take 1-2 tablets by mouth every 4 (four) hours as needed for moderate pain or severe pain. 90 tablet 0   polyethylene glycol (MIRALAX) packet Take 17 g by mouth daily. 30 each 1   pomalidomide (POMALYST) 2 MG capsule Take 1 capsule (2 mg total) by mouth daily. Take for 21 days on, 7 days off, repeat every 28 days 21 capsule 0   prochlorperazine (COMPAZINE) 10 MG tablet Take 1 tablet (10 mg total) by mouth every 6 (six) hours as needed for nausea or vomiting. 30 tablet 1   senna-docusate (SENNA S) 8.6-50 MG tablet Take 2 tablets by mouth at bedtime. 60 tablet 3   Simethicone (GAS-X PO) Take 1 tablet by mouth as needed.     vitamin C (ASCORBIC ACID) 500 MG tablet Take 500 mg by mouth 2 (two) times a week.      No current facility-administered medications for this visit.    PHYSICAL EXAMINATION:. .BP (!) 185/85 (BP Location: Left Arm, Patient Position: Sitting) Comment: nurse notified  Pulse 62   Temp 98.1 F (36.7 C) (Temporal)   Resp 14   Ht 5'  (1.524 m)   Wt 102 lb 4.8 oz (46.4 kg)   SpO2 100%   BMI 19.98 kg/m  . GENERAL:alert, in no acute distress and comfortable SKIN: no acute rashes, no significant lesions EYES: conjunctiva are pink and non-injected, sclera anicteric OROPHARYNX: MMM, no exudates, no oropharyngeal erythema or ulceration NECK: supple, no JVD LYMPH:  no palpable lymphadenopathy in the cervical, axillary or inguinal regions LUNGS: clear to auscultation b/l with normal respiratory effort HEART: regular rate & rhythm ABDOMEN:  normoactive bowel sounds , non tender, not distended. Extremity: no pedal edema PSYCH: alert & oriented x 3 with fluent speech NEURO: no focal motor/sensory deficits  LABORATORY DATA:   .    Latest Ref Rng & Units 05/19/2022    9:00 AM 05/12/2022    8:33 AM 04/29/2022    8:23 AM  CBC  WBC 4.0 - 10.5 K/uL 3.8  4.1  5.1   Hemoglobin 12.0 - 15.0 g/dL 10.3  10.1  9.9   Hematocrit 36.0 - 46.0 % 30.7  30.1  29.8   Platelets 150 - 400 K/uL 236  202  318       Latest Ref Rng & Units 05/19/2022    9:00 AM 05/12/2022    8:33 AM 04/29/2022    8:23 AM  CMP  Glucose 70 - 99 mg/dL 90  105  111   BUN 8 - 23 mg/dL $Remove'17  18  17   'SWYGGLz$ Creatinine 0.44 - 1.00 mg/dL 0.60  0.64  0.68   Sodium 135 - 145 mmol/L 138  137  139   Potassium 3.5 - 5.1 mmol/L 4.1  4.3  4.4   Chloride 98 - 111 mmol/L 107  105  107   CO2 22 - 32 mmol/L $RemoveB'26  28  27   'EDgNRliy$ Calcium 8.9 - 10.3 mg/dL 9.3  8.9  8.8   Total Protein 6.5 - 8.1 g/dL 7.2  7.8  7.4   Total Bilirubin 0.3 - 1.2 mg/dL 0.3  0.3  0.5   Alkaline Phos 38 - 126 U/L $Remov'26  27  26   'lZsjqf$ AST 15 - 41 U/L $Remo'12  11  12   'bUkuC$ ALT 0 - 44 U/L $Remo'13  10  9       'xuupu$ RADIOGRAPHIC STUDIES: I have personally reviewed the radiological images as listed and agreed with the  findings in the report. No results found.   ASSESSMENT & PLAN:  Megan Olson is a 81 y.o. female who presents for a follow up for multiple myeloma.   1. 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  She achieved complete response with negative M protein and negative PET scan in December 2020. -She subsequently went on Ninlaro maintenance therapy -PET scan from May 08, 2021, which showed focal activity within the right femur, consistent with active multiple myeloma, no other hypermetabolic lesion or plasmacytoma. -05/06/2021 bone marrow biopsy, which showed 3% plasma cells. --Recommend Zometa every 2 months. PLAN Here for cycle 1 day 8 of daratumumab pomalidomide chemotherapy. Labs done today were reviewed with her in detail and are stable No significant new toxicities from her current plan of treatment Medication refills done as requested Patient will continue current plan of treatment.  2. Mild anemia -We will continue to monitor with myeloma treatment and current medications  3.  H/O constipation -Requiring ED visit on 12/26/2021 -Likely medication induced.  -Symptoms have improved and bowel habits are back to baseline. Recommend to continue current regimen of MiraLAX daily and Senna 2 capsules p.o. at bedtime.  4.  Cancer related pain  -Currently well controlled -Continue fentanyl patch at 12 mcg/h and Percocet for break through pain.   Follow-up Please schedule remaining cycle 1 as well as cycle 2 of daratumumab Port flush and labs with each treatment MD visit in 3 weeks with cycle 2-day 1  The total time spent in the appointment was 30 minutes*.  All of the patient's questions were answered with apparent satisfaction. The patient knows to call the clinic with any problems, questions or concerns.   Sullivan Lone MD MS AAHIVMS [facility] Hematology/Oncology Physician [facility]  .*Total Encounter Time as defined by the Centers for Medicare and Medicaid Services includes, in addition to the face-to-face time of  a patient visit (documented in the note above) non-face-to-face time: obtaining and reviewing outside history, ordering and reviewing medications, tests or procedures, care coordination (communications with other health care professionals or caregivers) and documentation in the medical record.

## 2022-05-26 ENCOUNTER — Inpatient Hospital Stay: Payer: Medicare HMO

## 2022-05-26 ENCOUNTER — Other Ambulatory Visit (HOSPITAL_COMMUNITY): Payer: Self-pay

## 2022-05-26 ENCOUNTER — Other Ambulatory Visit: Payer: Self-pay

## 2022-05-26 VITALS — BP 148/81 | HR 63 | Resp 18 | Wt 101.5 lb

## 2022-05-26 DIAGNOSIS — Z7189 Other specified counseling: Secondary | ICD-10-CM

## 2022-05-26 DIAGNOSIS — C9000 Multiple myeloma not having achieved remission: Secondary | ICD-10-CM

## 2022-05-26 DIAGNOSIS — C9002 Multiple myeloma in relapse: Secondary | ICD-10-CM

## 2022-05-26 DIAGNOSIS — Z95828 Presence of other vascular implants and grafts: Secondary | ICD-10-CM

## 2022-05-26 DIAGNOSIS — Z5112 Encounter for antineoplastic immunotherapy: Secondary | ICD-10-CM | POA: Diagnosis not present

## 2022-05-26 LAB — CMP (CANCER CENTER ONLY)
ALT: 10 U/L (ref 0–44)
AST: 12 U/L — ABNORMAL LOW (ref 15–41)
Albumin: 3.8 g/dL (ref 3.5–5.0)
Alkaline Phosphatase: 25 U/L — ABNORMAL LOW (ref 38–126)
Anion gap: 3 — ABNORMAL LOW (ref 5–15)
BUN: 13 mg/dL (ref 8–23)
CO2: 28 mmol/L (ref 22–32)
Calcium: 9 mg/dL (ref 8.9–10.3)
Chloride: 105 mmol/L (ref 98–111)
Creatinine: 0.6 mg/dL (ref 0.44–1.00)
GFR, Estimated: >60 mL/min (ref >60)
Glucose, Bld: 98 mg/dL (ref 70–99)
Potassium: 4.4 mmol/L (ref 3.5–5.1)
Sodium: 136 mmol/L (ref 135–145)
Total Bilirubin: 0.4 mg/dL (ref 0.3–1.2)
Total Protein: 6.8 g/dL (ref 6.5–8.1)

## 2022-05-26 LAB — CBC WITH DIFFERENTIAL (CANCER CENTER ONLY)
Abs Immature Granulocytes: 0.03 10*3/uL (ref 0.00–0.07)
Basophils Absolute: 0 10*3/uL (ref 0.0–0.1)
Basophils Relative: 1 %
Eosinophils Absolute: 0.2 10*3/uL (ref 0.0–0.5)
Eosinophils Relative: 4 %
HCT: 30.2 % — ABNORMAL LOW (ref 36.0–46.0)
Hemoglobin: 10.2 g/dL — ABNORMAL LOW (ref 12.0–15.0)
Immature Granulocytes: 1 %
Lymphocytes Relative: 6 %
Lymphs Abs: 0.2 10*3/uL — ABNORMAL LOW (ref 0.7–4.0)
MCH: 35.5 pg — ABNORMAL HIGH (ref 26.0–34.0)
MCHC: 33.8 g/dL (ref 30.0–36.0)
MCV: 105.2 fL — ABNORMAL HIGH (ref 80.0–100.0)
Monocytes Absolute: 0.9 10*3/uL (ref 0.1–1.0)
Monocytes Relative: 22 %
Neutro Abs: 2.7 10*3/uL (ref 1.7–7.7)
Neutrophils Relative %: 66 %
Platelet Count: 217 10*3/uL (ref 150–400)
RBC: 2.87 MIL/uL — ABNORMAL LOW (ref 3.87–5.11)
RDW: 13 % (ref 11.5–15.5)
WBC Count: 4 10*3/uL (ref 4.0–10.5)
nRBC: 0 % (ref 0.0–0.2)

## 2022-05-26 MED ORDER — SODIUM CHLORIDE 0.9 % IV SOLN: Freq: Once | INTRAVENOUS | Status: AC

## 2022-05-26 MED ORDER — SODIUM CHLORIDE 0.9 % IV SOLN
20.0000 mg | Freq: Once | INTRAVENOUS | Status: AC
  Administered 2022-05-26: 20 mg via INTRAVENOUS
  Filled 2022-05-26: qty 20

## 2022-05-26 MED ORDER — ACETAMINOPHEN 325 MG PO TABS
650.0000 mg | ORAL_TABLET | Freq: Once | ORAL | Status: AC
  Administered 2022-05-26: 650 mg via ORAL
  Filled 2022-05-26: qty 2

## 2022-05-26 MED ORDER — SODIUM CHLORIDE 0.9 % IV SOLN
16.0000 mg/kg | Freq: Once | INTRAVENOUS | Status: AC
  Administered 2022-05-26: 700 mg via INTRAVENOUS
  Filled 2022-05-26: qty 20

## 2022-05-26 MED ORDER — MONTELUKAST SODIUM 10 MG PO TABS
10.0000 mg | ORAL_TABLET | Freq: Once | ORAL | Status: AC
  Administered 2022-05-26: 10 mg via ORAL

## 2022-05-26 MED ORDER — SODIUM CHLORIDE 0.9% FLUSH
10.0000 mL | INTRAVENOUS | Status: DC | PRN
  Administered 2022-05-26: 10 mL

## 2022-05-26 MED ORDER — HEPARIN SOD (PORK) LOCK FLUSH 100 UNIT/ML IV SOLN
500.0000 [IU] | Freq: Once | INTRAVENOUS | Status: AC | PRN
  Administered 2022-05-26: 500 [IU]

## 2022-05-26 MED ORDER — FAMOTIDINE IN NACL 20-0.9 MG/50ML-% IV SOLN
20.0000 mg | Freq: Once | INTRAVENOUS | Status: AC
  Administered 2022-05-26: 20 mg via INTRAVENOUS
  Filled 2022-05-26: qty 50

## 2022-05-26 MED ORDER — HYDROXYZINE HCL 25 MG PO TABS
25.0000 mg | ORAL_TABLET | Freq: Once | ORAL | Status: AC
  Administered 2022-05-26: 25 mg via ORAL
  Filled 2022-05-26: qty 1

## 2022-05-26 NOTE — Patient Instructions (Signed)
[facility]  Discharge Instructions: Thank you for choosing [facility] to provide your oncology and hematology care.   If you have a lab appointment with the Cancer Center, please go directly to the Cancer Center and check in at the registration area.   Wear comfortable clothing and clothing appropriate for easy access to any Portacath or PICC line.   We strive to give you quality time with your provider. You may need to reschedule your appointment if you arrive late (15 or more minutes).  Arriving late affects you and other patients whose appointments are after yours.  Also, if you miss three or more appointments without notifying the office, you may be dismissed from the clinic at the provider's discretion.      For prescription refill requests, have your pharmacy contact our office and allow 72 hours for refills to be completed.    Today you received the following chemotherapy and/or immunotherapy agents: Daratumumab      To help prevent nausea and vomiting after your treatment, we encourage you to take your nausea medication as directed.  BELOW ARE SYMPTOMS THAT SHOULD BE REPORTED IMMEDIATELY: *FEVER GREATER THAN 100.4 F (38 C) OR HIGHER *CHILLS OR SWEATING *NAUSEA AND VOMITING THAT IS NOT CONTROLLED WITH YOUR NAUSEA MEDICATION *UNUSUAL SHORTNESS OF BREATH *UNUSUAL BRUISING OR BLEEDING *URINARY PROBLEMS (pain or burning when urinating, or frequent urination) *BOWEL PROBLEMS (unusual diarrhea, constipation, pain near the anus) TENDERNESS IN MOUTH AND THROAT WITH OR WITHOUT PRESENCE OF ULCERS (sore throat, sores in mouth, or a toothache) UNUSUAL RASH, SWELLING OR PAIN  UNUSUAL VAGINAL DISCHARGE OR ITCHING   Items with * indicate a potential emergency and should be followed up as soon as possible or go to the Emergency Department if any problems should occur.  Please show the CHEMOTHERAPY ALERT CARD or IMMUNOTHERAPY ALERT CARD at check-in  to the Emergency Department and triage nurse.  Should you have questions after your visit or need to cancel or reschedule your appointment, please contact [facility]  Dept: [phone]  and follow the prompts.  Office hours are 8:00 a.m. to 4:30 p.m. Monday - Friday. Please note that voicemails left after 4:00 p.m. may not be returned until the following business day.  We are closed weekends and major holidays. You have access to a nurse at all times for urgent questions. Please call the main number to the clinic Dept: [phone] and follow the prompts.   For any non-urgent questions, you may also contact your provider using MyChart. We now offer e-Visits for anyone 18 and older to request care online for non-urgent symptoms. For details visit [email or web address].   Also download the MyChart app! Go to the app store, search "MyChart", open the app, select [facility], and log in with your MyChart username and password.  Masks are optional in the cancer centers. If you would like for your care team to wear a mask while they are taking care of you, please let them know. You may have one support person who is at least 81 years old accompany you for your appointments. 

## 2022-06-01 ENCOUNTER — Other Ambulatory Visit (HOSPITAL_COMMUNITY): Payer: Self-pay

## 2022-06-01 MED FILL — Dexamethasone Sodium Phosphate Inj 100 MG/10ML: INTRAMUSCULAR | Qty: 2 | Status: AC

## 2022-06-02 ENCOUNTER — Inpatient Hospital Stay: Payer: Medicare HMO | Admitting: Hematology

## 2022-06-02 ENCOUNTER — Inpatient Hospital Stay: Payer: Medicare HMO

## 2022-06-02 ENCOUNTER — Other Ambulatory Visit: Payer: Self-pay

## 2022-06-02 VITALS — BP 161/79 | HR 71 | Temp 98.6°F | Resp 14 | Wt 102.0 lb

## 2022-06-02 DIAGNOSIS — Z5112 Encounter for antineoplastic immunotherapy: Secondary | ICD-10-CM | POA: Diagnosis not present

## 2022-06-02 DIAGNOSIS — C9000 Multiple myeloma not having achieved remission: Secondary | ICD-10-CM

## 2022-06-02 DIAGNOSIS — Z95828 Presence of other vascular implants and grafts: Secondary | ICD-10-CM

## 2022-06-02 DIAGNOSIS — Z7189 Other specified counseling: Secondary | ICD-10-CM

## 2022-06-02 DIAGNOSIS — C9002 Multiple myeloma in relapse: Secondary | ICD-10-CM

## 2022-06-02 LAB — CBC WITH DIFFERENTIAL (CANCER CENTER ONLY)
Abs Immature Granulocytes: 0.01 10*3/uL (ref 0.00–0.07)
Basophils Absolute: 0 10*3/uL (ref 0.0–0.1)
Basophils Relative: 1 %
Eosinophils Absolute: 0.2 10*3/uL (ref 0.0–0.5)
Eosinophils Relative: 7 %
HCT: 30.8 % — ABNORMAL LOW (ref 36.0–46.0)
Hemoglobin: 10.5 g/dL — ABNORMAL LOW (ref 12.0–15.0)
Immature Granulocytes: 1 %
Lymphocytes Relative: 9 %
Lymphs Abs: 0.2 10*3/uL — ABNORMAL LOW (ref 0.7–4.0)
MCH: 35.6 pg — ABNORMAL HIGH (ref 26.0–34.0)
MCHC: 34.1 g/dL (ref 30.0–36.0)
MCV: 104.4 fL — ABNORMAL HIGH (ref 80.0–100.0)
Monocytes Absolute: 0.5 10*3/uL (ref 0.1–1.0)
Monocytes Relative: 25 %
Neutro Abs: 1.2 10*3/uL — ABNORMAL LOW (ref 1.7–7.7)
Neutrophils Relative %: 57 %
Platelet Count: 176 10*3/uL (ref 150–400)
RBC: 2.95 MIL/uL — ABNORMAL LOW (ref 3.87–5.11)
RDW: 12.8 % (ref 11.5–15.5)
WBC Count: 2.2 10*3/uL — ABNORMAL LOW (ref 4.0–10.5)
nRBC: 0 % (ref 0.0–0.2)

## 2022-06-02 LAB — CMP (CANCER CENTER ONLY)
ALT: 11 U/L (ref 0–44)
AST: 10 U/L — ABNORMAL LOW (ref 15–41)
Albumin: 3.9 g/dL (ref 3.5–5.0)
Alkaline Phosphatase: 26 U/L — ABNORMAL LOW (ref 38–126)
Anion gap: 3 — ABNORMAL LOW (ref 5–15)
BUN: 15 mg/dL (ref 8–23)
CO2: 29 mmol/L (ref 22–32)
Calcium: 9.1 mg/dL (ref 8.9–10.3)
Chloride: 106 mmol/L (ref 98–111)
Creatinine: 0.5 mg/dL (ref 0.44–1.00)
GFR, Estimated: >60 mL/min (ref >60)
Glucose, Bld: 101 mg/dL — ABNORMAL HIGH (ref 70–99)
Potassium: 4.1 mmol/L (ref 3.5–5.1)
Sodium: 138 mmol/L (ref 135–145)
Total Bilirubin: 0.4 mg/dL (ref 0.3–1.2)
Total Protein: 7 g/dL (ref 6.5–8.1)

## 2022-06-02 MED ORDER — HEPARIN SOD (PORK) LOCK FLUSH 100 UNIT/ML IV SOLN
500.0000 [IU] | Freq: Once | INTRAVENOUS | Status: AC | PRN
  Administered 2022-06-02: 500 [IU]

## 2022-06-02 MED ORDER — SODIUM CHLORIDE 0.9% FLUSH: 10.0000 mL | Freq: Once | INTRAVENOUS | Status: AC

## 2022-06-02 MED ORDER — HEPARIN SOD (PORK) LOCK FLUSH 100 UNIT/ML IV SOLN: 500.0000 [IU] | Freq: Once | INTRAVENOUS | Status: DC | PRN

## 2022-06-02 MED ORDER — SODIUM CHLORIDE 0.9 % IV SOLN: Freq: Once | INTRAVENOUS | Status: AC

## 2022-06-02 MED ORDER — SODIUM CHLORIDE 0.9% FLUSH
10.0000 mL | Freq: Once | INTRAVENOUS | Status: AC
  Administered 2022-06-02: 10 mL

## 2022-06-02 MED ORDER — SODIUM CHLORIDE 0.9 % IV SOLN
16.0000 mg/kg | Freq: Once | INTRAVENOUS | Status: AC
  Administered 2022-06-02: 700 mg via INTRAVENOUS
  Filled 2022-06-02: qty 20

## 2022-06-02 MED ORDER — SODIUM CHLORIDE 0.9% FLUSH: 3.0000 mL | Freq: Once | INTRAVENOUS | Status: AC | PRN

## 2022-06-02 MED ORDER — ALTEPLASE 2 MG IJ SOLR: 2.0000 mg | Freq: Once | INTRAMUSCULAR | Status: DC | PRN

## 2022-06-02 MED ORDER — SODIUM CHLORIDE 0.9% FLUSH
10.0000 mL | INTRAVENOUS | Status: DC | PRN
  Administered 2022-06-02: 10 mL

## 2022-06-02 MED ORDER — HEPARIN SOD (PORK) LOCK FLUSH 100 UNIT/ML IV SOLN: 250.0000 [IU] | Freq: Once | INTRAVENOUS | Status: AC | PRN

## 2022-06-02 MED ORDER — HYDROXYZINE HCL 25 MG PO TABS
25.0000 mg | ORAL_TABLET | Freq: Once | ORAL | Status: AC
  Administered 2022-06-02: 25 mg via ORAL
  Filled 2022-06-02: qty 1

## 2022-06-02 MED ORDER — FAMOTIDINE IN NACL 20-0.9 MG/50ML-% IV SOLN
20.0000 mg | Freq: Once | INTRAVENOUS | Status: AC
  Administered 2022-06-02: 20 mg via INTRAVENOUS
  Filled 2022-06-02: qty 50

## 2022-06-02 MED ORDER — SODIUM CHLORIDE 0.9 % IV SOLN
20.0000 mg | Freq: Once | INTRAVENOUS | Status: AC
  Administered 2022-06-02: 20 mg via INTRAVENOUS
  Filled 2022-06-02: qty 20

## 2022-06-02 MED ORDER — ACETAMINOPHEN 325 MG PO TABS
650.0000 mg | ORAL_TABLET | Freq: Once | ORAL | Status: AC
  Administered 2022-06-02: 650 mg via ORAL
  Filled 2022-06-02: qty 2

## 2022-06-02 NOTE — Progress Notes (Signed)
Accelerated Daratumumab Infusion Pharmacist Review   Megan Olson is a 81 y.o. female being treated with daratumumab for myeloma. This patient may be considered for the accelerated infusion protocol.    _0  Patient has received at least 2 prior doses of daratumumab at standard infusion rates.  Infusion Date 1 May 12, 2022  Infusion Date 2  May 19, 2022   _1  No documented infusion related reactions with prior infusions.   _2  Physician approval of accelerated 90 minute infusion daratumumab.   _3  Orders updated in current Treatment Plan to reflect accelerated 90 minute infusion protocol with further doses.-- IS cutover; will need to ensure provider selects a 3.5 hour inf appt request in tx plan   Kennith Center, Pharm.D., CPP 06/02/2022_4 :24 AM     Resource: Angela Burke., et al. Ninety-minute daratumumab infusion is safe in multiple myeloma. Leukemia. 2018;32:2495-2518. doi: 98.3382/N05397-673-4193-7

## 2022-06-02 NOTE — Patient Instructions (Signed)
[facility]COLOG[facility]  Discharge Instructions: Thank you for choosing [facility] to provide your oncology and hematology care.   If you have a lab appointment with the Cancer Center, please go directly to the Cancer Center and check in at the registration area.   Wear comfortable clothing and clothing appropriate for easy access to any Portacath or PICC line.   We strive to give you quality time with your provider. You may need to reschedule your appointment if you arrive late (15 or more minutes).  Arriving late affects you and other patients whose appointments are after yours.  Also, if you miss three or more appointments without notifying the office, you may be dismissed from the clinic at the provider's discretion.      For prescription refill requests, have your pharmacy contact our office and allow 72 hours for refills to be completed.    Today you received the following chemotherapy and/or immunotherapy agents: Darzalex      To help prevent nausea and vomiting after your treatment, we encourage you to take your nausea medication as directed.  BELOW ARE SYMPTOMS THAT SHOULD BE REPORTED IMMEDIATELY: *FEVER GREATER THAN 100.4 F (38 C) OR HIGHER *CHILLS OR SWEATING *NAUSEA AND VOMITING THAT IS NOT CONTROLLED WITH YOUR NAUSEA MEDICATION *UNUSUAL SHORTNESS OF BREATH *UNUSUAL BRUISING OR BLEEDING *URINARY PROBLEMS (pain or burning when urinating, or frequent urination) *BOWEL PROBLEMS (unusual diarrhea, constipation, pain near the anus) TENDERNESS IN MOUTH AND THROAT WITH OR WITHOUT PRESENCE OF ULCERS (sore throat, sores in mouth, or a toothache) UNUSUAL RASH, SWELLING OR PAIN  UNUSUAL VAGINAL DISCHARGE OR ITCHING   Items with * indicate a potential emergency and should be followed up as soon as possible or go to the Emergency Department if any problems should occur.  Please show the CHEMOTHERAPY ALERT CARD or IMMUNOTHERAPY ALERT CARD at check-in to  the Emergency Department and triage nurse.  Should you have questions after your visit or need to cancel or reschedule your appointment, please contact [facility]  Dept: [phone]  and follow the prompts.  Office hours are 8:00 a.m. to 4:30 p.m. Monday - Friday. Please note that voicemails left after 4:00 p.m. may not be returned until the following business day.  We are closed weekends and major holidays. You have access to a nurse at all times for urgent questions. Please call the main number to the clinic Dept: [phone] and follow the prompts.   For any non-urgent questions, you may also contact your provider using MyChart. We now offer e-Visits for anyone 18 and older to request care online for non-urgent symptoms. For details visit [email or web address].   Also download the MyChart app! Go to the app store, search "MyChart", open the app, select [facility], and log in with your MyChart username and password.  Masks are optional in the cancer centers. If you would like for your care team to wear a mask while they are taking care of you, please let them know. You may have one support person who is at least 81 years old accompany you for your appointments. 

## 2022-06-02 NOTE — Progress Notes (Signed)
Per Dr. Irene Limbo pt ok for tx with ANC of 1.2

## 2022-06-03 ENCOUNTER — Other Ambulatory Visit: Payer: Self-pay

## 2022-06-03 DIAGNOSIS — Z7189 Other specified counseling: Secondary | ICD-10-CM

## 2022-06-03 DIAGNOSIS — C9002 Multiple myeloma in relapse: Secondary | ICD-10-CM

## 2022-06-03 MED ORDER — POMALIDOMIDE 2 MG PO CAPS: 2.0000 mg | ORAL_CAPSULE | Freq: Every day | ORAL | 0 refills | Status: DC

## 2022-06-10 ENCOUNTER — Inpatient Hospital Stay: Payer: Medicare HMO | Attending: Hematology

## 2022-06-10 ENCOUNTER — Inpatient Hospital Stay (HOSPITAL_BASED_OUTPATIENT_CLINIC_OR_DEPARTMENT_OTHER): Payer: Medicare HMO | Admitting: Hematology

## 2022-06-10 ENCOUNTER — Other Ambulatory Visit: Payer: Self-pay

## 2022-06-10 ENCOUNTER — Inpatient Hospital Stay: Payer: Medicare HMO

## 2022-06-10 VITALS — BP 164/85 | HR 60 | Temp 98.2°F | Resp 17

## 2022-06-10 VITALS — BP 186/75 | HR 61 | Temp 97.9°F | Resp 18 | Wt 101.1 lb

## 2022-06-10 DIAGNOSIS — E079 Disorder of thyroid, unspecified: Secondary | ICD-10-CM | POA: Diagnosis not present

## 2022-06-10 DIAGNOSIS — D649 Anemia, unspecified: Secondary | ICD-10-CM | POA: Diagnosis not present

## 2022-06-10 DIAGNOSIS — Z9221 Personal history of antineoplastic chemotherapy: Secondary | ICD-10-CM | POA: Diagnosis not present

## 2022-06-10 DIAGNOSIS — Z95828 Presence of other vascular implants and grafts: Secondary | ICD-10-CM

## 2022-06-10 DIAGNOSIS — C9000 Multiple myeloma not having achieved remission: Secondary | ICD-10-CM | POA: Diagnosis not present

## 2022-06-10 DIAGNOSIS — M8580 Other specified disorders of bone density and structure, unspecified site: Secondary | ICD-10-CM | POA: Diagnosis not present

## 2022-06-10 DIAGNOSIS — K5900 Constipation, unspecified: Secondary | ICD-10-CM | POA: Insufficient documentation

## 2022-06-10 DIAGNOSIS — C9002 Multiple myeloma in relapse: Secondary | ICD-10-CM

## 2022-06-10 DIAGNOSIS — Z5112 Encounter for antineoplastic immunotherapy: Secondary | ICD-10-CM | POA: Insufficient documentation

## 2022-06-10 DIAGNOSIS — Z86010 Personal history of colonic polyps: Secondary | ICD-10-CM | POA: Insufficient documentation

## 2022-06-10 DIAGNOSIS — G893 Neoplasm related pain (acute) (chronic): Secondary | ICD-10-CM | POA: Diagnosis not present

## 2022-06-10 DIAGNOSIS — I10 Essential (primary) hypertension: Secondary | ICD-10-CM | POA: Diagnosis not present

## 2022-06-10 DIAGNOSIS — Z79899 Other long term (current) drug therapy: Secondary | ICD-10-CM | POA: Insufficient documentation

## 2022-06-10 DIAGNOSIS — Z7189 Other specified counseling: Secondary | ICD-10-CM

## 2022-06-10 LAB — CMP (CANCER CENTER ONLY)
ALT: 11 U/L (ref 0–44)
AST: 11 U/L — ABNORMAL LOW (ref 15–41)
Albumin: 3.9 g/dL (ref 3.5–5.0)
Alkaline Phosphatase: 24 U/L — ABNORMAL LOW (ref 38–126)
Anion gap: 3 — ABNORMAL LOW (ref 5–15)
BUN: 18 mg/dL (ref 8–23)
CO2: 28 mmol/L (ref 22–32)
Calcium: 9 mg/dL (ref 8.9–10.3)
Chloride: 105 mmol/L (ref 98–111)
Creatinine: 0.63 mg/dL (ref 0.44–1.00)
GFR, Estimated: >60 mL/min (ref >60)
Glucose, Bld: 87 mg/dL (ref 70–99)
Potassium: 4.3 mmol/L (ref 3.5–5.1)
Sodium: 136 mmol/L (ref 135–145)
Total Bilirubin: 0.4 mg/dL (ref 0.3–1.2)
Total Protein: 6.8 g/dL (ref 6.5–8.1)

## 2022-06-10 LAB — CBC WITH DIFFERENTIAL (CANCER CENTER ONLY)
Abs Immature Granulocytes: 0.02 10*3/uL (ref 0.00–0.07)
Basophils Absolute: 0.1 10*3/uL (ref 0.0–0.1)
Basophils Relative: 5 %
Eosinophils Absolute: 0.2 10*3/uL (ref 0.0–0.5)
Eosinophils Relative: 7 %
HCT: 31.7 % — ABNORMAL LOW (ref 36.0–46.0)
Hemoglobin: 10.6 g/dL — ABNORMAL LOW (ref 12.0–15.0)
Immature Granulocytes: 1 %
Lymphocytes Relative: 8 %
Lymphs Abs: 0.2 10*3/uL — ABNORMAL LOW (ref 0.7–4.0)
MCH: 34.6 pg — ABNORMAL HIGH (ref 26.0–34.0)
MCHC: 33.4 g/dL (ref 30.0–36.0)
MCV: 103.6 fL — ABNORMAL HIGH (ref 80.0–100.0)
Monocytes Absolute: 0.8 10*3/uL (ref 0.1–1.0)
Monocytes Relative: 30 %
Neutro Abs: 1.3 10*3/uL — ABNORMAL LOW (ref 1.7–7.7)
Neutrophils Relative %: 49 %
Platelet Count: 263 10*3/uL (ref 150–400)
RBC: 3.06 MIL/uL — ABNORMAL LOW (ref 3.87–5.11)
RDW: 12.9 % (ref 11.5–15.5)
WBC Count: 2.6 10*3/uL — ABNORMAL LOW (ref 4.0–10.5)
nRBC: 0 % (ref 0.0–0.2)

## 2022-06-10 MED ORDER — HEPARIN SOD (PORK) LOCK FLUSH 100 UNIT/ML IV SOLN
500.0000 [IU] | Freq: Once | INTRAVENOUS | Status: AC | PRN
  Administered 2022-06-10: 500 [IU]

## 2022-06-10 MED ORDER — FAMOTIDINE IN NACL 20-0.9 MG/50ML-% IV SOLN
20.0000 mg | Freq: Once | INTRAVENOUS | Status: AC
  Administered 2022-06-10: 20 mg via INTRAVENOUS
  Filled 2022-06-10: qty 50

## 2022-06-10 MED ORDER — SODIUM CHLORIDE 0.9 % IV SOLN: Freq: Once | INTRAVENOUS | Status: AC

## 2022-06-10 MED ORDER — ACETAMINOPHEN 325 MG PO TABS
650.0000 mg | ORAL_TABLET | Freq: Once | ORAL | Status: AC
  Administered 2022-06-10: 650 mg via ORAL
  Filled 2022-06-10: qty 2

## 2022-06-10 MED ORDER — SODIUM CHLORIDE 0.9% FLUSH
10.0000 mL | Freq: Once | INTRAVENOUS | Status: AC
  Administered 2022-06-10: 10 mL

## 2022-06-10 MED ORDER — SODIUM CHLORIDE 0.9% FLUSH
10.0000 mL | INTRAVENOUS | Status: DC | PRN
  Administered 2022-06-10: 10 mL

## 2022-06-10 MED ORDER — SODIUM CHLORIDE 0.9 % IV SOLN
20.0000 mg | Freq: Once | INTRAVENOUS | Status: AC
  Administered 2022-06-10: 20 mg via INTRAVENOUS
  Filled 2022-06-10: qty 20

## 2022-06-10 MED ORDER — HYDROXYZINE HCL 25 MG PO TABS
25.0000 mg | ORAL_TABLET | Freq: Once | ORAL | Status: AC
  Administered 2022-06-10: 25 mg via ORAL
  Filled 2022-06-10: qty 1

## 2022-06-10 MED ORDER — SODIUM CHLORIDE 0.9 % IV SOLN
16.0000 mg/kg | Freq: Once | INTRAVENOUS | Status: AC
  Administered 2022-06-10: 700 mg via INTRAVENOUS
  Filled 2022-06-10: qty 20

## 2022-06-10 NOTE — Patient Instructions (Signed)
[name]  Discharge Instructions: Thank you for choosing [facility] to provide your Olson and hematology care.   If you have a lab appointment with the Cancer Center, please go directly to the Cancer Center and check in at the registration area.   Wear comfortable clothing and clothing appropriate for easy access to any Portacath or PICC line.   We strive to give you quality time with your provider. You may need to reschedule your appointment if you arrive late (15 or more minutes).  Arriving late affects you and other patients whose appointments are after yours.  Also, if you miss three or more appointments without notifying the office, you may be dismissed from the clinic at the provider's discretion.      For prescription refill requests, have your pharmacy contact our office and allow 72 hours for refills to be completed.    Today you received the following chemotherapy and/or immunotherapy agents: Daratumumab      To help prevent nausea and vomiting after your treatment, we encourage you to take your nausea medication as directed.  BELOW ARE SYMPTOMS THAT SHOULD BE REPORTED IMMEDIATELY: *FEVER GREATER THAN 100.4 F (38 C) OR HIGHER *CHILLS OR SWEATING *NAUSEA AND VOMITING THAT IS NOT CONTROLLED WITH YOUR NAUSEA MEDICATION *UNUSUAL SHORTNESS OF BREATH *UNUSUAL BRUISING OR BLEEDING *URINARY PROBLEMS (pain or burning when urinating, or frequent urination) *BOWEL PROBLEMS (unusual diarrhea, constipation, pain near the anus) TENDERNESS IN MOUTH AND THROAT WITH OR WITHOUT PRESENCE OF ULCERS (sore throat, sores in mouth, or a toothache) UNUSUAL RASH, SWELLING OR PAIN  UNUSUAL VAGINAL DISCHARGE OR ITCHING   Items with * indicate a potential emergency and should be followed up as soon as possible or go to the Emergency Department if any problems should occur.  Please show the CHEMOTHERAPY ALERT CARD or IMMUNOTHERAPY ALERT CARD at check-in  to the Emergency Department and triage nurse.  Should you have questions after your visit or need to cancel or reschedule your appointment, please contact Megan Olson  Dept: [phone]  and follow the prompts.  Office hours are 8:00 a.m. to 4:30 p.m. Monday - Friday. Please note that voicemails left after 4:00 p.m. may not be returned until the following business day.  We are closed weekends and major holidays. You have access to a nurse at all times for urgent questions. Please call the main number to the clinic Dept: [phone] and follow the prompts.   For any non-urgent questions, you may also contact your provider using MyChart. We now offer e-Visits for anyone 18 and older to request care online for non-urgent symptoms. For details visit [email or web address].   Also download the MyChart app! Go to the app store, search "MyChart", open the app, select [facility], and log in with your MyChart username and password.  Masks are optional in the cancer centers. If you would like for your care team to wear a mask while they are taking care of you, please let them know. You may have one support person who is at least 81 years old accompany you for your appointments. 

## 2022-06-10 NOTE — Progress Notes (Signed)
Per Irene Limbo MD, ok to treat today with ANC 1.3

## 2022-06-14 NOTE — Progress Notes (Signed)
[facility]   [phone] [phone]   Clinic Follow up Note   Patient Care Team: [name], MD as PCP - General (Family Medicine) [name], MD as Consulting Physician (Hematology) Bond, [name], MD as Referring Physician (Ophthalmology) [name], [name], MD as Consulting Physician (Hematology and Oncology) [name], MD as Consulting Physician (Internal Medicine) [name], MD as Consulting Physician (General Surgery) [name], [facility] as Pharmacist (Pharmacist)  DOS 06/10/2022   CHIEF COMPLAINT:  Follow-up for continued evaluation and management of multiple myeloma   SUMMARY OF ONCOLOGIC HISTORY: Oncology History  Multiple myeloma not having achieved remission ([place])  12/12/2018 Initial Diagnosis   Multiple myeloma not having achieved remission ([place])   12/20/2018 - 10/04/2019 Chemotherapy   The patient had dexamethasone (DECADRON) tablet 20 mg, 20 mg (100 % of original dose 20 mg), Oral, Once, 14 of 14 cycles Dose modification: 20 mg (original dose 20 mg, Cycle 1), 10 mg (original dose 20 mg, Cycle 14) Administration: 20 mg (12/20/2018), 20 mg (12/27/2018), 20 mg (01/10/2019), 20 mg (01/17/2019), 20 mg (01/31/2019), 20 mg (02/07/2019), 20 mg (03/14/2019), 20 mg (03/21/2019), 20 mg (02/21/2019), 20 mg (02/28/2019), 20 mg (04/04/2019), 20 mg (04/11/2019), 20 mg (04/25/2019), 20 mg (05/02/2019), 20 mg (05/16/2019), 20 mg (05/23/2019), 20 mg (06/06/2019), 20 mg (06/13/2019), 20 mg (06/27/2019), 20 mg (07/04/2019), 20 mg (07/18/2019), 20 mg (07/25/2019), 20 mg (08/08/2019), 20 mg (08/15/2019), 20 mg (08/29/2019), 20 mg (09/05/2019), 10 mg (09/19/2019) lenalidomide (REVLIMID) 15 MG capsule, 1 of 1 cycle, Start date: 01/18/2019, End date: 02/21/2019 bortezomib SQ (VELCADE) chemo injection 2 mg, 1.3 mg/m2 = 2 mg, Subcutaneous,  Once, 14 of 14 cycles Administration: 2 mg (12/20/2018), 2 mg (12/27/2018), 2 mg (12/23/2018), 2 mg (12/30/2018), 2  mg (01/10/2019), 2 mg (01/17/2019), 2 mg (01/13/2019), 2 mg (01/20/2019), 2 mg (01/31/2019), 2 mg (02/07/2019), 2 mg (02/03/2019), 2 mg (02/10/2019), 2 mg (03/14/2019), 2 mg (03/17/2019), 2 mg (03/21/2019), 2 mg (03/24/2019), 2 mg (02/21/2019), 2 mg (02/24/2019), 2 mg (02/28/2019), 2 mg (03/03/2019), 2 mg (04/04/2019), 2 mg (04/06/2019), 2 mg (04/11/2019), 2 mg (04/14/2019), 2 mg (04/25/2019), 2 mg (04/28/2019), 2 mg (05/02/2019), 2 mg (05/05/2019), 2 mg (05/16/2019), 2 mg (05/19/2019), 2 mg (05/23/2019), 2 mg (05/26/2019), 2 mg (06/06/2019), 2 mg (06/09/2019), 2 mg (06/13/2019), 2 mg (06/16/2019), 2 mg (06/27/2019), 2 mg (06/30/2019), 2 mg (07/04/2019), 2 mg (07/07/2019), 2 mg (07/18/2019), 2 mg (07/21/2019), 2 mg (07/25/2019), 2 mg (07/28/2019), 2 mg (08/08/2019), 2 mg (08/11/2019), 2 mg (08/15/2019), 2 mg (08/18/2019), 2 mg (08/29/2019), 2 mg (09/01/2019), 2 mg (09/05/2019), 2 mg (09/08/2019), 2 mg (09/19/2019), 2 mg (10/04/2019)  for chemotherapy treatment.    Multiple myeloma in relapse ([place])  04/16/2021 Initial Diagnosis   Multiple myeloma in relapse ([place])   04/30/2021 - 04/15/2022 Chemotherapy   Patient is on Treatment Plan : MYELOMA RELAPSED/REFRACTORY KCd q28d     05/12/2022 -  Chemotherapy   Patient is on Treatment Plan : MYELOMA Daratumumab + Pomalidomide + Dexamethasone q28d x 7 cycles       CURRENT THERAPY: Kyprolis and dexamethasone, started on 04/30/2021  INTERVAL HISTORY: Megan Olson is a 81 y.o. female returns for continued evaluation and management of multiple myeloma. She reports She is doing well with no new symptoms or concerns.  No fevers no chills no night sweats.   No focal new bone pains.   No significant new fatigue. No other new or acute focal symptoms.  No notable toxicities  from her current daratumumab plus pomalidomide.  Labs done today were reviewed in detail with the patient  MEDICAL HISTORY:  Past Medical History:  Diagnosis Date   Anemia    Glaucoma    HYPERTENSION 03/11/2007   Multiple myeloma ([place])     OSTEOPENIA 03/11/2007   Pre-diabetes    Thyroid cancer ([place])    Thyroid disease    Tubular adenoma of colon 07/2015    SURGICAL HISTORY: Past Surgical History:  Procedure Laterality Date   BONE MARROW BIOPSY     multiple   BREAST EXCISIONAL BIOPSY Right 2000   BREAST LUMPECTOMY  1990   benign   CATARACT EXTRACTION Bilateral 2018   DILATION AND CURETTAGE OF UTERUS     bleeding at menopause. No uterine cancer   IR IMAGING GUIDED PORT INSERTION  05/16/2021   IR RADIOLOGIST EVAL & MGMT  12/13/2018   THYROIDECTOMY N/A 08/02/2020   Procedure: TOTAL THYROIDECTOMY;  Surgeon: [name], MD;  Location: [facility];  Service: General;  Laterality: N/A;   TONSILLECTOMY     age 53   I have reviewed the social history and family history with the patient and they are unchanged from previous note.  ALLERGIES:  is allergic to ace inhibitors, benadryl [diphenhydramine], diamox [acetazolamide], sulfamethoxazole, lenalidomide, and penicillins.  MEDICATIONS:  Current Outpatient Medications  Medication Sig Dispense Refill   acyclovir (ZOVIRAX) 400 MG tablet Take 1 tablet (400 mg total) by mouth 2 (two) times daily. 60 tablet 3   acyclovir (ZOVIRAX) 400 MG tablet Take 1 tablet (400 mg total) by mouth 2 (two) times daily. 60 tablet 11   ALPHAGAN P 0.1 % SOLN Place 1 drop into both eyes in the morning, at noon, and at bedtime.      amLODipine (NORVASC) 10 MG tablet Take 1 tablet (10 mg total) by mouth daily. 90 tablet 3   aspirin EC 81 MG tablet Take 81 mg by mouth daily.     bimatoprost (LUMIGAN) 0.03 % ophthalmic solution Place 1 drop into both eyes at bedtime.      Cholecalciferol (VITAMIN D3) 125 MCG (5000 UT) TABS Take 5,000 Units by mouth daily.     Co-Enzyme Q-10 100 MG CAPS Take 100 mg by mouth daily.     dexamethasone (DECADRON) 4 MG tablet Take 5 tablets (20 mg total) by mouth daily. Take the day after daratumumab. Take with breakfast 20 tablet 11   dorzolamide-timolol (COSOPT) 22.3-6.8 MG/ML  ophthalmic solution Place 1 drop into both eyes 2 (two) times daily.   11   fentaNYL (DURAGESIC) 12 MCG/HR Place 1 patch onto the skin every 3 (three) days. 10 patch 0   levothyroxine (SYNTHROID) 88 MCG tablet Take 1 tablet (88 mcg total) by mouth daily before breakfast. 90 tablet 3   lidocaine-prilocaine (EMLA) cream Apply 1 application topically as needed. Apply prior to port access. 30 g 0   lidocaine-prilocaine (EMLA) cream Apply to affected area once 30 g 3   LUMIGAN 0.01 % SOLN 1 drop at bedtime.     magnesium chloride (SLOW-MAG) 64 MG TBEC SR tablet Take by mouth.     Multiple Vitamins-Minerals (MULTIVITAMIN WITH MINERALS) tablet Take 1 tablet by mouth daily. Centrum Silver 50 +     Netarsudil-Latanoprost (ROCKLATAN) 0.02-0.005 % SOLN Place 1 drop into both eyes daily.     Omega-3 1000 MG CAPS Take 1,000 mg by mouth daily.      ondansetron (ZOFRAN) 8 MG tablet Take 1 tablet (8 mg total) by  mouth every 8 (eight) hours as needed for nausea or vomiting. 30 tablet 1   oxyCODONE-acetaminophen (PERCOCET) 5-325 MG tablet Take 1-2 tablets by mouth every 4 (four) hours as needed for moderate pain or severe pain. 90 tablet 0   polyethylene glycol (MIRALAX) packet Take 17 g by mouth daily. 30 each 1   pomalidomide (POMALYST) 2 MG capsule Take 1 capsule (2 mg total) by mouth daily. Take for 21 days on, 7 days off, repeat every 28 days 21 capsule 0   prochlorperazine (COMPAZINE) 10 MG tablet Take 1 tablet (10 mg total) by mouth every 6 (six) hours as needed for nausea or vomiting. 30 tablet 1   senna-docusate (SENNA S) 8.6-50 MG tablet Take 2 tablets by mouth at bedtime. 60 tablet 3   Simethicone (GAS-X PO) Take 1 tablet by mouth as needed.     vitamin C (ASCORBIC ACID) 500 MG tablet Take 500 mg by mouth 2 (two) times a week.      No current facility-administered medications for this visit.    PHYSICAL EXAMINATION:. .BP (!) 186/75 (BP Location: Left Arm, Patient Position: Sitting)   Pulse 61    Temp 97.9 F (36.6 C) (Oral)   Resp 18   Wt 101 lb 2 oz (45.9 kg)   SpO2 96%   BMI 19.75 kg/m  NAD GENERAL:alert, in no acute distress and comfortable SKIN: no acute rashes, no significant lesions EYES: conjunctiva are pink and non-injected, sclera anicteric NECK: supple, no JVD LYMPH:  no palpable lymphadenopathy in the cervical, axillary or inguinal regions LUNGS: clear to auscultation b/l with normal respiratory effort HEART: regular rate & rhythm ABDOMEN:  normoactive bowel sounds , non tender, not distended. Extremity: no pedal edema PSYCH: alert & oriented x 3 with fluent speech NEURO: no focal motor/sensory deficits  LABORATORY DATA:   .    Latest Ref Rng & Units 06/10/2022    8:57 AM 06/02/2022    9:59 AM 05/26/2022    9:30 AM  CBC  WBC 4.0 - 10.5 K/uL 2.6  2.2  4.0   Hemoglobin 12.0 - 15.0 g/dL 10.6  10.5  10.2   Hematocrit 36.0 - 46.0 % 31.7  30.8  30.2   Platelets 150 - 400 K/uL 263  176  217       Latest Ref Rng & Units 06/10/2022    8:57 AM 06/02/2022    9:59 AM 05/26/2022    9:30 AM  CMP  Glucose 70 - 99 mg/dL 87  101  98   BUN 8 - 23 mg/dL $Remove'18  15  13   'oHgNXlO$ Creatinine 0.44 - 1.00 mg/dL 0.63  0.50  0.60   Sodium 135 - 145 mmol/L 136  138  136   Potassium 3.5 - 5.1 mmol/L 4.3  4.1  4.4   Chloride 98 - 111 mmol/L 105  106  105   CO2 22 - 32 mmol/L $RemoveB'28  29  28   'eTxYYIFR$ Calcium 8.9 - 10.3 mg/dL 9.0  9.1  9.0   Total Protein 6.5 - 8.1 g/dL 6.8  7.0  6.8   Total Bilirubin 0.3 - 1.2 mg/dL 0.4  0.4  0.4   Alkaline Phos 38 - 126 U/L $Remov'24  26  25   'CnypQi$ AST 15 - 41 U/L $Remo'11  10  12   'vUcrm$ ALT 0 - 44 U/L $Remo'11  11  10       'zWlOw$ RADIOGRAPHIC STUDIES: I have personally reviewed the radiological images as listed and agreed with  the findings in the report. No results found.   ASSESSMENT & PLAN:  Megan Olson is a 81 y.o. female who presents for a follow up for multiple myeloma.   1. 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  She achieved complete response with negative M protein and negative PET scan in December 2020. -She subsequently went on Ninlaro maintenance therapy -PET scan from May 08, 2021, which showed focal activity within the right femur, consistent with active multiple myeloma, no other hypermetabolic lesion or plasmacytoma. -05/06/2021 bone marrow biopsy, which showed 3% plasma cells. --Recommend Zometa every 2 months. PLAN Here for cycle 2 day 1 of daratumumab pomalidomide chemotherapy. Labs done today were reviewed with her in detail and are stable No significant new toxicities from her current plan of treatment Medication refills done as requested Patient will continue current plan of treatment.  2. Mild anemia -We will continue to monitor with myeloma treatment and current medications  3.  H/O constipation -Requiring ED visit on 12/26/2021 -Likely medication induced.  -Symptoms have improved and bowel habits are back to baseline. Recommend to continue current regimen of MiraLAX daily and Senna 2 capsules p.o. at bedtime.  4.  Cancer related pain  -Currently well controlled -Continue fentanyl patch at 12 mcg/h and Percocet for break through pain.   Follow-up   The total time spent in the appointment was 25 minutes*.  All of the patient's questions were answered with apparent satisfaction. The patient knows to call the clinic with any problems, questions or concerns.   Sullivan Lone MD MS AAHIVMS [facility] Hematology/Oncology Physician [facility]  .*Total Encounter Time as defined by the Centers for Medicare and Medicaid Services includes, in addition to the face-to-face time of a patient visit (documented in the note above) non-face-to-face time: obtaining and reviewing outside history, ordering and reviewing medications, tests or procedures, care coordination  (communications with other health care professionals or caregivers) and documentation in the medical record.  I, Melene Muller, am acting as scribe for Dr. Sullivan Lone, MD. .I have reviewed the above documentation for accuracy and completeness, and I agree with the above. [name] MD

## 2022-06-16 ENCOUNTER — Inpatient Hospital Stay: Payer: Medicare HMO

## 2022-06-16 ENCOUNTER — Other Ambulatory Visit: Payer: Self-pay

## 2022-06-16 VITALS — BP 160/84 | HR 64 | Temp 98.6°F | Resp 17

## 2022-06-16 DIAGNOSIS — C9000 Multiple myeloma not having achieved remission: Secondary | ICD-10-CM

## 2022-06-16 DIAGNOSIS — Z7189 Other specified counseling: Secondary | ICD-10-CM

## 2022-06-16 DIAGNOSIS — Z95828 Presence of other vascular implants and grafts: Secondary | ICD-10-CM

## 2022-06-16 DIAGNOSIS — Z5112 Encounter for antineoplastic immunotherapy: Secondary | ICD-10-CM | POA: Diagnosis not present

## 2022-06-16 DIAGNOSIS — Z79899 Other long term (current) drug therapy: Secondary | ICD-10-CM | POA: Diagnosis not present

## 2022-06-16 DIAGNOSIS — K5900 Constipation, unspecified: Secondary | ICD-10-CM | POA: Diagnosis not present

## 2022-06-16 DIAGNOSIS — C9002 Multiple myeloma in relapse: Secondary | ICD-10-CM

## 2022-06-16 DIAGNOSIS — E079 Disorder of thyroid, unspecified: Secondary | ICD-10-CM | POA: Diagnosis not present

## 2022-06-16 DIAGNOSIS — Z9221 Personal history of antineoplastic chemotherapy: Secondary | ICD-10-CM | POA: Diagnosis not present

## 2022-06-16 DIAGNOSIS — D649 Anemia, unspecified: Secondary | ICD-10-CM | POA: Diagnosis not present

## 2022-06-16 DIAGNOSIS — M8580 Other specified disorders of bone density and structure, unspecified site: Secondary | ICD-10-CM | POA: Diagnosis not present

## 2022-06-16 DIAGNOSIS — Z86010 Personal history of colonic polyps: Secondary | ICD-10-CM | POA: Diagnosis not present

## 2022-06-16 DIAGNOSIS — G893 Neoplasm related pain (acute) (chronic): Secondary | ICD-10-CM | POA: Diagnosis not present

## 2022-06-16 DIAGNOSIS — I10 Essential (primary) hypertension: Secondary | ICD-10-CM | POA: Diagnosis not present

## 2022-06-16 LAB — CMP (CANCER CENTER ONLY)
ALT: 13 U/L (ref 0–44)
AST: 12 U/L — ABNORMAL LOW (ref 15–41)
Albumin: 3.9 g/dL (ref 3.5–5.0)
Alkaline Phosphatase: 25 U/L — ABNORMAL LOW (ref 38–126)
Anion gap: 4 — ABNORMAL LOW (ref 5–15)
BUN: 16 mg/dL (ref 8–23)
CO2: 29 mmol/L (ref 22–32)
Calcium: 9.2 mg/dL (ref 8.9–10.3)
Chloride: 104 mmol/L (ref 98–111)
Creatinine: 0.68 mg/dL (ref 0.44–1.00)
GFR, Estimated: >60 mL/min (ref >60)
Glucose, Bld: 122 mg/dL — ABNORMAL HIGH (ref 70–99)
Potassium: 4.3 mmol/L (ref 3.5–5.1)
Sodium: 137 mmol/L (ref 135–145)
Total Bilirubin: 0.4 mg/dL (ref 0.3–1.2)
Total Protein: 6.6 g/dL (ref 6.5–8.1)

## 2022-06-16 LAB — CBC WITH DIFFERENTIAL (CANCER CENTER ONLY)
Abs Immature Granulocytes: 0.01 10*3/uL (ref 0.00–0.07)
Basophils Absolute: 0.1 10*3/uL (ref 0.0–0.1)
Basophils Relative: 2 %
Eosinophils Absolute: 0.2 10*3/uL (ref 0.0–0.5)
Eosinophils Relative: 6 %
HCT: 32.8 % — ABNORMAL LOW (ref 36.0–46.0)
Hemoglobin: 11 g/dL — ABNORMAL LOW (ref 12.0–15.0)
Immature Granulocytes: 0 %
Lymphocytes Relative: 6 %
Lymphs Abs: 0.2 10*3/uL — ABNORMAL LOW (ref 0.7–4.0)
MCH: 34.9 pg — ABNORMAL HIGH (ref 26.0–34.0)
MCHC: 33.5 g/dL (ref 30.0–36.0)
MCV: 104.1 fL — ABNORMAL HIGH (ref 80.0–100.0)
Monocytes Absolute: 0.3 10*3/uL (ref 0.1–1.0)
Monocytes Relative: 8 %
Neutro Abs: 3 10*3/uL (ref 1.7–7.7)
Neutrophils Relative %: 78 %
Platelet Count: 242 10*3/uL (ref 150–400)
RBC: 3.15 MIL/uL — ABNORMAL LOW (ref 3.87–5.11)
RDW: 13 % (ref 11.5–15.5)
WBC Count: 3.8 10*3/uL — ABNORMAL LOW (ref 4.0–10.5)
nRBC: 0 % (ref 0.0–0.2)

## 2022-06-16 MED ORDER — ACETAMINOPHEN 325 MG PO TABS
650.0000 mg | ORAL_TABLET | Freq: Once | ORAL | Status: AC
  Administered 2022-06-16: 650 mg via ORAL
  Filled 2022-06-16: qty 2

## 2022-06-16 MED ORDER — SODIUM CHLORIDE 0.9 % IV SOLN: Freq: Once | INTRAVENOUS | Status: AC

## 2022-06-16 MED ORDER — SODIUM CHLORIDE 0.9 % IV SOLN
16.0000 mg/kg | Freq: Once | INTRAVENOUS | Status: AC
  Administered 2022-06-16: 700 mg via INTRAVENOUS
  Filled 2022-06-16: qty 20

## 2022-06-16 MED ORDER — HYDROXYZINE HCL 25 MG PO TABS
25.0000 mg | ORAL_TABLET | Freq: Once | ORAL | Status: AC
  Administered 2022-06-16: 25 mg via ORAL
  Filled 2022-06-16: qty 1

## 2022-06-16 MED ORDER — SODIUM CHLORIDE 0.9% FLUSH
10.0000 mL | Freq: Once | INTRAVENOUS | Status: AC
  Administered 2022-06-16: 10 mL

## 2022-06-16 MED ORDER — SODIUM CHLORIDE 0.9% FLUSH
10.0000 mL | INTRAVENOUS | Status: DC | PRN
  Administered 2022-06-16: 10 mL

## 2022-06-16 MED ORDER — HEPARIN SOD (PORK) LOCK FLUSH 100 UNIT/ML IV SOLN
500.0000 [IU] | Freq: Once | INTRAVENOUS | Status: AC | PRN
  Administered 2022-06-16: 500 [IU]

## 2022-06-16 MED ORDER — FAMOTIDINE IN NACL 20-0.9 MG/50ML-% IV SOLN
20.0000 mg | Freq: Once | INTRAVENOUS | Status: AC
  Administered 2022-06-16: 20 mg via INTRAVENOUS
  Filled 2022-06-16: qty 50

## 2022-06-16 MED ORDER — SODIUM CHLORIDE 0.9 % IV SOLN
20.0000 mg | Freq: Once | INTRAVENOUS | Status: AC
  Administered 2022-06-16: 20 mg via INTRAVENOUS
  Filled 2022-06-16: qty 20

## 2022-06-17 ENCOUNTER — Encounter: Payer: Self-pay | Admitting: Hematology

## 2022-06-17 ENCOUNTER — Ambulatory Visit: Payer: Medicare HMO

## 2022-06-17 ENCOUNTER — Other Ambulatory Visit: Payer: Medicare HMO

## 2022-06-19 ENCOUNTER — Other Ambulatory Visit: Payer: Self-pay

## 2022-06-19 ENCOUNTER — Other Ambulatory Visit: Payer: Self-pay | Admitting: Hematology

## 2022-06-19 DIAGNOSIS — C9000 Multiple myeloma not having achieved remission: Secondary | ICD-10-CM

## 2022-06-19 DIAGNOSIS — C9002 Multiple myeloma in relapse: Secondary | ICD-10-CM

## 2022-06-19 MED ORDER — FENTANYL 12 MCG/HR TD PT72: 1.0000 | MEDICATED_PATCH | TRANSDERMAL | 0 refills | Status: DC

## 2022-06-22 MED FILL — Dexamethasone Sodium Phosphate Inj 100 MG/10ML: INTRAMUSCULAR | Qty: 2 | Status: AC

## 2022-06-23 ENCOUNTER — Inpatient Hospital Stay: Payer: Medicare HMO

## 2022-06-23 ENCOUNTER — Other Ambulatory Visit: Payer: Self-pay

## 2022-06-23 VITALS — BP 161/78 | HR 60 | Temp 98.2°F | Resp 16 | Wt 102.2 lb

## 2022-06-23 DIAGNOSIS — Z7189 Other specified counseling: Secondary | ICD-10-CM

## 2022-06-23 DIAGNOSIS — C9000 Multiple myeloma not having achieved remission: Secondary | ICD-10-CM

## 2022-06-23 DIAGNOSIS — I10 Essential (primary) hypertension: Secondary | ICD-10-CM | POA: Diagnosis not present

## 2022-06-23 DIAGNOSIS — Z95828 Presence of other vascular implants and grafts: Secondary | ICD-10-CM

## 2022-06-23 DIAGNOSIS — D649 Anemia, unspecified: Secondary | ICD-10-CM | POA: Diagnosis not present

## 2022-06-23 DIAGNOSIS — G893 Neoplasm related pain (acute) (chronic): Secondary | ICD-10-CM | POA: Diagnosis not present

## 2022-06-23 DIAGNOSIS — C9002 Multiple myeloma in relapse: Secondary | ICD-10-CM

## 2022-06-23 DIAGNOSIS — E079 Disorder of thyroid, unspecified: Secondary | ICD-10-CM | POA: Diagnosis not present

## 2022-06-23 DIAGNOSIS — K5900 Constipation, unspecified: Secondary | ICD-10-CM | POA: Diagnosis not present

## 2022-06-23 DIAGNOSIS — Z79899 Other long term (current) drug therapy: Secondary | ICD-10-CM | POA: Diagnosis not present

## 2022-06-23 DIAGNOSIS — Z86010 Personal history of colonic polyps: Secondary | ICD-10-CM | POA: Diagnosis not present

## 2022-06-23 DIAGNOSIS — M8580 Other specified disorders of bone density and structure, unspecified site: Secondary | ICD-10-CM | POA: Diagnosis not present

## 2022-06-23 DIAGNOSIS — Z5112 Encounter for antineoplastic immunotherapy: Secondary | ICD-10-CM | POA: Diagnosis not present

## 2022-06-23 DIAGNOSIS — Z9221 Personal history of antineoplastic chemotherapy: Secondary | ICD-10-CM | POA: Diagnosis not present

## 2022-06-23 LAB — CBC WITH DIFFERENTIAL (CANCER CENTER ONLY)
Abs Immature Granulocytes: 0.02 10*3/uL (ref 0.00–0.07)
Basophils Absolute: 0.1 10*3/uL (ref 0.0–0.1)
Basophils Relative: 3 %
Eosinophils Absolute: 0.4 10*3/uL (ref 0.0–0.5)
Eosinophils Relative: 8 %
HCT: 32.7 % — ABNORMAL LOW (ref 36.0–46.0)
Hemoglobin: 11.3 g/dL — ABNORMAL LOW (ref 12.0–15.0)
Immature Granulocytes: 0 %
Lymphocytes Relative: 5 %
Lymphs Abs: 0.2 10*3/uL — ABNORMAL LOW (ref 0.7–4.0)
MCH: 35.5 pg — ABNORMAL HIGH (ref 26.0–34.0)
MCHC: 34.6 g/dL (ref 30.0–36.0)
MCV: 102.8 fL — ABNORMAL HIGH (ref 80.0–100.0)
Monocytes Absolute: 0.8 10*3/uL (ref 0.1–1.0)
Monocytes Relative: 17 %
Neutro Abs: 3.1 10*3/uL (ref 1.7–7.7)
Neutrophils Relative %: 67 %
Platelet Count: 206 10*3/uL (ref 150–400)
RBC: 3.18 MIL/uL — ABNORMAL LOW (ref 3.87–5.11)
RDW: 12.8 % (ref 11.5–15.5)
WBC Count: 4.6 10*3/uL (ref 4.0–10.5)
nRBC: 0 % (ref 0.0–0.2)

## 2022-06-23 LAB — CMP (CANCER CENTER ONLY)
ALT: 11 U/L (ref 0–44)
AST: 9 U/L — ABNORMAL LOW (ref 15–41)
Albumin: 3.9 g/dL (ref 3.5–5.0)
Alkaline Phosphatase: 24 U/L — ABNORMAL LOW (ref 38–126)
Anion gap: 4 — ABNORMAL LOW (ref 5–15)
BUN: 13 mg/dL (ref 8–23)
CO2: 29 mmol/L (ref 22–32)
Calcium: 8.7 mg/dL — ABNORMAL LOW (ref 8.9–10.3)
Chloride: 107 mmol/L (ref 98–111)
Creatinine: 0.61 mg/dL (ref 0.44–1.00)
GFR, Estimated: >60 mL/min (ref >60)
Glucose, Bld: 100 mg/dL — ABNORMAL HIGH (ref 70–99)
Potassium: 4.2 mmol/L (ref 3.5–5.1)
Sodium: 140 mmol/L (ref 135–145)
Total Bilirubin: 0.4 mg/dL (ref 0.3–1.2)
Total Protein: 6.4 g/dL — ABNORMAL LOW (ref 6.5–8.1)

## 2022-06-23 MED ORDER — FAMOTIDINE IN NACL 20-0.9 MG/50ML-% IV SOLN
20.0000 mg | Freq: Once | INTRAVENOUS | Status: AC
  Administered 2022-06-23: 20 mg via INTRAVENOUS
  Filled 2022-06-23: qty 50

## 2022-06-23 MED ORDER — SODIUM CHLORIDE 0.9% FLUSH
10.0000 mL | INTRAVENOUS | Status: DC | PRN
  Administered 2022-06-23: 10 mL

## 2022-06-23 MED ORDER — SODIUM CHLORIDE 0.9 % IV SOLN
16.0000 mg/kg | Freq: Once | INTRAVENOUS | Status: AC
  Administered 2022-06-23: 700 mg via INTRAVENOUS
  Filled 2022-06-23: qty 20

## 2022-06-23 MED ORDER — SODIUM CHLORIDE 0.9 % IV SOLN: Freq: Once | INTRAVENOUS | Status: AC

## 2022-06-23 MED ORDER — ACETAMINOPHEN 325 MG PO TABS
650.0000 mg | ORAL_TABLET | Freq: Once | ORAL | Status: AC
  Administered 2022-06-23: 650 mg via ORAL
  Filled 2022-06-23: qty 2

## 2022-06-23 MED ORDER — SODIUM CHLORIDE 0.9 % IV SOLN
20.0000 mg | Freq: Once | INTRAVENOUS | Status: AC
  Administered 2022-06-23: 20 mg via INTRAVENOUS
  Filled 2022-06-23: qty 20

## 2022-06-23 MED ORDER — HYDROXYZINE HCL 25 MG PO TABS
25.0000 mg | ORAL_TABLET | Freq: Once | ORAL | Status: AC
  Administered 2022-06-23: 25 mg via ORAL
  Filled 2022-06-23: qty 1

## 2022-06-23 MED ORDER — ZOLEDRONIC ACID 4 MG/100ML IV SOLN
4.0000 mg | Freq: Once | INTRAVENOUS | Status: AC
  Administered 2022-06-23: 4 mg via INTRAVENOUS
  Filled 2022-06-23: qty 100

## 2022-06-23 MED ORDER — SODIUM CHLORIDE 0.9% FLUSH
10.0000 mL | Freq: Once | INTRAVENOUS | Status: AC
  Administered 2022-06-23: 10 mL

## 2022-06-23 MED ORDER — HEPARIN SOD (PORK) LOCK FLUSH 100 UNIT/ML IV SOLN
500.0000 [IU] | Freq: Once | INTRAVENOUS | Status: AC | PRN
  Administered 2022-06-23: 500 [IU]

## 2022-06-23 NOTE — Patient Instructions (Signed)
[name]  Discharge Instructions: Thank you for choosing [facility] to provide your oncology and hematology care.   If you have a lab appointment with the [name], please go directly to the [name] and check in at the registration area.   Wear comfortable clothing and clothing appropriate for easy access to any Portacath or PICC line.   We strive to give you quality time with your provider. You may need to reschedule your appointment if you arrive late (15 or more minutes).  Arriving late affects you and other patients whose appointments are after yours.  Also, if you miss three or more appointments without notifying the office, you may be dismissed from the clinic at the provider's discretion.      For prescription refill requests, have your pharmacy contact our office and allow 72 hours for refills to be completed.    Today you received the following chemotherapy and/or immunotherapy agents: Daratumumab      To help prevent nausea and vomiting after your treatment, we encourage you to take your nausea medication as directed.  BELOW ARE SYMPTOMS THAT SHOULD BE REPORTED IMMEDIATELY: *FEVER GREATER THAN 100.4 F (38 C) OR HIGHER *CHILLS OR SWEATING *NAUSEA AND VOMITING THAT IS NOT CONTROLLED WITH YOUR NAUSEA MEDICATION *UNUSUAL SHORTNESS OF BREATH *UNUSUAL BRUISING OR BLEEDING *URINARY PROBLEMS (pain or burning when urinating, or frequent urination) *BOWEL PROBLEMS (unusual diarrhea, constipation, pain near the anus) TENDERNESS IN MOUTH AND THROAT WITH OR WITHOUT PRESENCE OF ULCERS (sore throat, sores in mouth, or a toothache) UNUSUAL RASH, SWELLING OR PAIN  UNUSUAL VAGINAL DISCHARGE OR ITCHING   Items with * indicate a potential emergency and should be followed up as soon as possible or go to the Emergency Department if any problems should occur.  Please show the CHEMOTHERAPY ALERT CARD or IMMUNOTHERAPY ALERT CARD at check-in  to the Emergency Department and triage nurse.  Should you have questions after your visit or need to cancel or reschedule your appointment, please contact [name]  Dept: [phone]  and follow the prompts.  Office hours are 8:00 a.m. to 4:30 p.m. Monday - Friday. Please note that voicemails left after 4:00 p.m. may not be returned until the following business day.  We are closed weekends and major holidays. You have access to a nurse at all times for urgent questions. Please call the main number to the clinic Dept: [phone] and follow the prompts.   For any non-urgent questions, you may also contact your provider using MyChart. We now offer e-Visits for anyone 18 and older to request care online for non-urgent symptoms. For details visit [name].   Also download the MyChart app! Go to the app store, search "MyChart", open the app, select [facility], and log in with your MyChart username and password.  Masks are optional in the cancer centers. If you would like for your care team to wear a mask while they are taking care of you, please let them know. You may have one support person who is at least 81 years old accompany you for your appointments. 

## 2022-06-23 NOTE — Progress Notes (Signed)
Per Dr. Irene Limbo, [place] to proceed with Zometa today with Corrected Calcium 8.78

## 2022-06-24 ENCOUNTER — Other Ambulatory Visit: Payer: Self-pay

## 2022-06-24 ENCOUNTER — Ambulatory Visit: Payer: Medicare HMO

## 2022-06-24 ENCOUNTER — Other Ambulatory Visit: Payer: Medicare HMO

## 2022-06-24 DIAGNOSIS — C9000 Multiple myeloma not having achieved remission: Secondary | ICD-10-CM

## 2022-06-25 ENCOUNTER — Ambulatory Visit (INDEPENDENT_AMBULATORY_CARE_PROVIDER_SITE_OTHER): Payer: Medicare HMO

## 2022-06-25 DIAGNOSIS — Z23 Encounter for immunization: Secondary | ICD-10-CM

## 2022-06-26 ENCOUNTER — Encounter: Payer: Self-pay | Admitting: Hematology

## 2022-06-26 ENCOUNTER — Other Ambulatory Visit: Payer: Self-pay

## 2022-06-26 MED ORDER — OXYCODONE-ACETAMINOPHEN 5-325 MG PO TABS: 1.0000 | ORAL_TABLET | ORAL | 0 refills | Status: DC | PRN

## 2022-06-28 ENCOUNTER — Other Ambulatory Visit: Payer: Self-pay | Admitting: Hematology

## 2022-06-28 DIAGNOSIS — C9002 Multiple myeloma in relapse: Secondary | ICD-10-CM

## 2022-06-28 DIAGNOSIS — Z7189 Other specified counseling: Secondary | ICD-10-CM

## 2022-06-29 ENCOUNTER — Encounter: Payer: Self-pay | Admitting: Hematology

## 2022-06-29 ENCOUNTER — Other Ambulatory Visit: Payer: Self-pay

## 2022-06-29 DIAGNOSIS — C9002 Multiple myeloma in relapse: Secondary | ICD-10-CM

## 2022-06-29 DIAGNOSIS — Z7189 Other specified counseling: Secondary | ICD-10-CM

## 2022-06-29 MED ORDER — ACYCLOVIR 400 MG PO TABS: 400.0000 mg | ORAL_TABLET | Freq: Two times a day (BID) | ORAL | 3 refills | Status: DC

## 2022-06-29 MED ORDER — POMALIDOMIDE 2 MG PO CAPS: 2.0000 mg | ORAL_CAPSULE | Freq: Every day | ORAL | 0 refills | Status: DC

## 2022-06-29 MED FILL — Dexamethasone Sodium Phosphate Inj 100 MG/10ML: INTRAMUSCULAR | Qty: 2 | Status: AC

## 2022-06-30 ENCOUNTER — Inpatient Hospital Stay: Payer: Medicare HMO

## 2022-06-30 ENCOUNTER — Other Ambulatory Visit: Payer: Self-pay

## 2022-06-30 VITALS — BP 121/71 | HR 63 | Temp 98.7°F | Resp 18 | Ht 60.0 in | Wt 105.0 lb

## 2022-06-30 DIAGNOSIS — Z7189 Other specified counseling: Secondary | ICD-10-CM

## 2022-06-30 DIAGNOSIS — C9000 Multiple myeloma not having achieved remission: Secondary | ICD-10-CM | POA: Diagnosis not present

## 2022-06-30 DIAGNOSIS — G893 Neoplasm related pain (acute) (chronic): Secondary | ICD-10-CM | POA: Diagnosis not present

## 2022-06-30 DIAGNOSIS — C9002 Multiple myeloma in relapse: Secondary | ICD-10-CM

## 2022-06-30 DIAGNOSIS — D649 Anemia, unspecified: Secondary | ICD-10-CM | POA: Diagnosis not present

## 2022-06-30 DIAGNOSIS — K5900 Constipation, unspecified: Secondary | ICD-10-CM | POA: Diagnosis not present

## 2022-06-30 DIAGNOSIS — M8580 Other specified disorders of bone density and structure, unspecified site: Secondary | ICD-10-CM | POA: Diagnosis not present

## 2022-06-30 DIAGNOSIS — E079 Disorder of thyroid, unspecified: Secondary | ICD-10-CM | POA: Diagnosis not present

## 2022-06-30 DIAGNOSIS — Z5112 Encounter for antineoplastic immunotherapy: Secondary | ICD-10-CM | POA: Diagnosis not present

## 2022-06-30 DIAGNOSIS — I10 Essential (primary) hypertension: Secondary | ICD-10-CM | POA: Diagnosis not present

## 2022-06-30 DIAGNOSIS — Z9221 Personal history of antineoplastic chemotherapy: Secondary | ICD-10-CM | POA: Diagnosis not present

## 2022-06-30 DIAGNOSIS — Z86010 Personal history of colonic polyps: Secondary | ICD-10-CM | POA: Diagnosis not present

## 2022-06-30 DIAGNOSIS — Z79899 Other long term (current) drug therapy: Secondary | ICD-10-CM | POA: Diagnosis not present

## 2022-06-30 LAB — CBC WITH DIFFERENTIAL (CANCER CENTER ONLY)
Abs Immature Granulocytes: 0.01 10*3/uL (ref 0.00–0.07)
Basophils Absolute: 0.1 10*3/uL (ref 0.0–0.1)
Basophils Relative: 3 %
Eosinophils Absolute: 0.5 10*3/uL (ref 0.0–0.5)
Eosinophils Relative: 18 %
HCT: 32.1 % — ABNORMAL LOW (ref 36.0–46.0)
Hemoglobin: 10.8 g/dL — ABNORMAL LOW (ref 12.0–15.0)
Immature Granulocytes: 0 %
Lymphocytes Relative: 8 %
Lymphs Abs: 0.2 10*3/uL — ABNORMAL LOW (ref 0.7–4.0)
MCH: 34.6 pg — ABNORMAL HIGH (ref 26.0–34.0)
MCHC: 33.6 g/dL (ref 30.0–36.0)
MCV: 102.9 fL — ABNORMAL HIGH (ref 80.0–100.0)
Monocytes Absolute: 0.7 10*3/uL (ref 0.1–1.0)
Monocytes Relative: 23 %
Neutro Abs: 1.5 10*3/uL — ABNORMAL LOW (ref 1.7–7.7)
Neutrophils Relative %: 48 %
Platelet Count: 181 10*3/uL (ref 150–400)
RBC: 3.12 MIL/uL — ABNORMAL LOW (ref 3.87–5.11)
RDW: 13.2 % (ref 11.5–15.5)
WBC Count: 3 10*3/uL — ABNORMAL LOW (ref 4.0–10.5)
nRBC: 0 % (ref 0.0–0.2)

## 2022-06-30 LAB — CMP (CANCER CENTER ONLY)
ALT: 11 U/L (ref 0–44)
AST: 10 U/L — ABNORMAL LOW (ref 15–41)
Albumin: 3.8 g/dL (ref 3.5–5.0)
Alkaline Phosphatase: 22 U/L — ABNORMAL LOW (ref 38–126)
Anion gap: 4 — ABNORMAL LOW (ref 5–15)
BUN: 16 mg/dL (ref 8–23)
CO2: 28 mmol/L (ref 22–32)
Calcium: 8.6 mg/dL — ABNORMAL LOW (ref 8.9–10.3)
Chloride: 106 mmol/L (ref 98–111)
Creatinine: 0.6 mg/dL (ref 0.44–1.00)
GFR, Estimated: >60 mL/min (ref >60)
Glucose, Bld: 97 mg/dL (ref 70–99)
Potassium: 4.2 mmol/L (ref 3.5–5.1)
Sodium: 138 mmol/L (ref 135–145)
Total Bilirubin: 0.4 mg/dL (ref 0.3–1.2)
Total Protein: 6.3 g/dL — ABNORMAL LOW (ref 6.5–8.1)

## 2022-06-30 MED ORDER — HEPARIN SOD (PORK) LOCK FLUSH 100 UNIT/ML IV SOLN
500.0000 [IU] | Freq: Once | INTRAVENOUS | Status: AC | PRN
  Administered 2022-06-30: 500 [IU]

## 2022-06-30 MED ORDER — SODIUM CHLORIDE 0.9 % IV SOLN: Freq: Once | INTRAVENOUS | Status: AC

## 2022-06-30 MED ORDER — SODIUM CHLORIDE 0.9 % IV SOLN
16.0000 mg/kg | Freq: Once | INTRAVENOUS | Status: AC
  Administered 2022-06-30: 700 mg via INTRAVENOUS
  Filled 2022-06-30: qty 20

## 2022-06-30 MED ORDER — SODIUM CHLORIDE 0.9% FLUSH
10.0000 mL | INTRAVENOUS | Status: DC | PRN
  Administered 2022-06-30: 10 mL

## 2022-06-30 MED ORDER — SODIUM CHLORIDE 0.9 % IV SOLN
20.0000 mg | Freq: Once | INTRAVENOUS | Status: AC
  Administered 2022-06-30: 20 mg via INTRAVENOUS
  Filled 2022-06-30: qty 20

## 2022-06-30 MED ORDER — HYDROXYZINE HCL 25 MG PO TABS
25.0000 mg | ORAL_TABLET | Freq: Once | ORAL | Status: AC
  Administered 2022-06-30: 25 mg via ORAL
  Filled 2022-06-30: qty 1

## 2022-06-30 MED ORDER — ACETAMINOPHEN 325 MG PO TABS
650.0000 mg | ORAL_TABLET | Freq: Once | ORAL | Status: AC
  Administered 2022-06-30: 650 mg via ORAL
  Filled 2022-06-30: qty 2

## 2022-06-30 MED ORDER — FAMOTIDINE IN NACL 20-0.9 MG/50ML-% IV SOLN
20.0000 mg | Freq: Once | INTRAVENOUS | Status: AC
  Administered 2022-06-30: 20 mg via INTRAVENOUS
  Filled 2022-06-30: qty 50

## 2022-06-30 NOTE — Patient Instructions (Signed)

## 2022-07-01 ENCOUNTER — Ambulatory Visit: Payer: Medicare HMO

## 2022-07-01 ENCOUNTER — Other Ambulatory Visit: Payer: Medicare HMO

## 2022-07-01 LAB — KAPPA/LAMBDA LIGHT CHAINS
Kappa free light chain: 11.1 mg/L (ref 3.3–19.4)
Kappa, lambda light chain ratio: 0.28 (ref 0.26–1.65)
Lambda free light chains: 40.3 mg/L — ABNORMAL HIGH (ref 5.7–26.3)

## 2022-07-03 ENCOUNTER — Encounter: Payer: Self-pay | Admitting: Hematology

## 2022-07-03 NOTE — Progress Notes (Signed)
This encounter was created in error - please disregard.

## 2022-07-06 LAB — MULTIPLE MYELOMA PANEL, SERUM
Albumin SerPl Elph-Mcnc: 3.3 g/dL (ref 2.9–4.4)
Albumin/Glob SerPl: 1.4 (ref 0.7–1.7)
Alpha 1: 0.3 g/dL (ref 0.0–0.4)
Alpha2 Glob SerPl Elph-Mcnc: 0.6 g/dL (ref 0.4–1.0)
B-Globulin SerPl Elph-Mcnc: 1.5 g/dL — ABNORMAL HIGH (ref 0.7–1.3)
Gamma Glob SerPl Elph-Mcnc: 0.2 g/dL — ABNORMAL LOW (ref 0.4–1.8)
Globulin, Total: 2.5 g/dL (ref 2.2–3.9)
IgA: 12 mg/dL — ABNORMAL LOW (ref 64–422)
IgG (Immunoglobin G), Serum: 1081 mg/dL (ref 586–1602)
IgM (Immunoglobulin M), Srm: 9 mg/dL — ABNORMAL LOW (ref 26–217)
M Protein SerPl Elph-Mcnc: 0.6 g/dL — ABNORMAL HIGH
Total Protein ELP: 5.8 g/dL — ABNORMAL LOW (ref 6.0–8.5)

## 2022-07-07 ENCOUNTER — Other Ambulatory Visit (HOSPITAL_COMMUNITY): Payer: Self-pay

## 2022-07-08 ENCOUNTER — Inpatient Hospital Stay: Payer: Medicare HMO | Attending: Hematology

## 2022-07-08 ENCOUNTER — Inpatient Hospital Stay: Payer: Medicare HMO

## 2022-07-08 ENCOUNTER — Inpatient Hospital Stay (HOSPITAL_BASED_OUTPATIENT_CLINIC_OR_DEPARTMENT_OTHER): Payer: Medicare HMO | Admitting: Hematology

## 2022-07-08 VITALS — BP 138/76 | HR 64 | Resp 16

## 2022-07-08 VITALS — BP 170/72 | HR 61 | Temp 98.1°F | Resp 18 | Ht 60.0 in | Wt 103.2 lb

## 2022-07-08 DIAGNOSIS — Z7982 Long term (current) use of aspirin: Secondary | ICD-10-CM | POA: Diagnosis not present

## 2022-07-08 DIAGNOSIS — Z7189 Other specified counseling: Secondary | ICD-10-CM

## 2022-07-08 DIAGNOSIS — Z5111 Encounter for antineoplastic chemotherapy: Secondary | ICD-10-CM

## 2022-07-08 DIAGNOSIS — I10 Essential (primary) hypertension: Secondary | ICD-10-CM | POA: Insufficient documentation

## 2022-07-08 DIAGNOSIS — R5383 Other fatigue: Secondary | ICD-10-CM | POA: Diagnosis not present

## 2022-07-08 DIAGNOSIS — G893 Neoplasm related pain (acute) (chronic): Secondary | ICD-10-CM

## 2022-07-08 DIAGNOSIS — C9002 Multiple myeloma in relapse: Secondary | ICD-10-CM

## 2022-07-08 DIAGNOSIS — D649 Anemia, unspecified: Secondary | ICD-10-CM | POA: Diagnosis not present

## 2022-07-08 DIAGNOSIS — Z79890 Hormone replacement therapy (postmenopausal): Secondary | ICD-10-CM | POA: Diagnosis not present

## 2022-07-08 DIAGNOSIS — Z95828 Presence of other vascular implants and grafts: Secondary | ICD-10-CM

## 2022-07-08 DIAGNOSIS — Z85850 Personal history of malignant neoplasm of thyroid: Secondary | ICD-10-CM | POA: Diagnosis not present

## 2022-07-08 DIAGNOSIS — Z5112 Encounter for antineoplastic immunotherapy: Secondary | ICD-10-CM | POA: Insufficient documentation

## 2022-07-08 DIAGNOSIS — Z79624 Long term (current) use of inhibitors of nucleotide synthesis: Secondary | ICD-10-CM | POA: Insufficient documentation

## 2022-07-08 DIAGNOSIS — C9000 Multiple myeloma not having achieved remission: Secondary | ICD-10-CM | POA: Insufficient documentation

## 2022-07-08 DIAGNOSIS — Z7961 Long term (current) use of immunomodulator: Secondary | ICD-10-CM | POA: Diagnosis not present

## 2022-07-08 LAB — CMP (CANCER CENTER ONLY)
ALT: 11 U/L (ref 0–44)
AST: 12 U/L — ABNORMAL LOW (ref 15–41)
Albumin: 3.9 g/dL (ref 3.5–5.0)
Alkaline Phosphatase: 24 U/L — ABNORMAL LOW (ref 38–126)
Anion gap: 3 — ABNORMAL LOW (ref 5–15)
BUN: 16 mg/dL (ref 8–23)
CO2: 28 mmol/L (ref 22–32)
Calcium: 8.6 mg/dL — ABNORMAL LOW (ref 8.9–10.3)
Chloride: 107 mmol/L (ref 98–111)
Creatinine: 0.6 mg/dL (ref 0.44–1.00)
GFR, Estimated: >60 mL/min (ref >60)
Glucose, Bld: 106 mg/dL — ABNORMAL HIGH (ref 70–99)
Potassium: 4.3 mmol/L (ref 3.5–5.1)
Sodium: 138 mmol/L (ref 135–145)
Total Bilirubin: 0.4 mg/dL (ref 0.3–1.2)
Total Protein: 5.9 g/dL — ABNORMAL LOW (ref 6.5–8.1)

## 2022-07-08 LAB — CBC WITH DIFFERENTIAL (CANCER CENTER ONLY)
Abs Immature Granulocytes: 0.02 10*3/uL (ref 0.00–0.07)
Basophils Absolute: 0.2 10*3/uL — ABNORMAL HIGH (ref 0.0–0.1)
Basophils Relative: 6 %
Eosinophils Absolute: 0.3 10*3/uL (ref 0.0–0.5)
Eosinophils Relative: 10 %
HCT: 32.2 % — ABNORMAL LOW (ref 36.0–46.0)
Hemoglobin: 10.9 g/dL — ABNORMAL LOW (ref 12.0–15.0)
Immature Granulocytes: 1 %
Lymphocytes Relative: 11 %
Lymphs Abs: 0.3 10*3/uL — ABNORMAL LOW (ref 0.7–4.0)
MCH: 34.8 pg — ABNORMAL HIGH (ref 26.0–34.0)
MCHC: 33.9 g/dL (ref 30.0–36.0)
MCV: 102.9 fL — ABNORMAL HIGH (ref 80.0–100.0)
Monocytes Absolute: 0.7 10*3/uL (ref 0.1–1.0)
Monocytes Relative: 25 %
Neutro Abs: 1.3 10*3/uL — ABNORMAL LOW (ref 1.7–7.7)
Neutrophils Relative %: 47 %
Platelet Count: 266 10*3/uL (ref 150–400)
RBC: 3.13 MIL/uL — ABNORMAL LOW (ref 3.87–5.11)
RDW: 13.2 % (ref 11.5–15.5)
Smear Review: NORMAL
WBC Count: 2.6 10*3/uL — ABNORMAL LOW (ref 4.0–10.5)
nRBC: 0 % (ref 0.0–0.2)

## 2022-07-08 MED ORDER — SODIUM CHLORIDE 0.9 % IV SOLN
16.0000 mg/kg | Freq: Once | INTRAVENOUS | Status: AC
  Administered 2022-07-08: 700 mg via INTRAVENOUS
  Filled 2022-07-08: qty 20

## 2022-07-08 MED ORDER — HEPARIN SOD (PORK) LOCK FLUSH 100 UNIT/ML IV SOLN
500.0000 [IU] | Freq: Once | INTRAVENOUS | Status: AC | PRN
  Administered 2022-07-08: 500 [IU]

## 2022-07-08 MED ORDER — FAMOTIDINE IN NACL 20-0.9 MG/50ML-% IV SOLN
20.0000 mg | Freq: Once | INTRAVENOUS | Status: AC
  Administered 2022-07-08: 20 mg via INTRAVENOUS
  Filled 2022-07-08: qty 50

## 2022-07-08 MED ORDER — SODIUM CHLORIDE 0.9 % IV SOLN
20.0000 mg | Freq: Once | INTRAVENOUS | Status: AC
  Administered 2022-07-08: 20 mg via INTRAVENOUS
  Filled 2022-07-08: qty 20

## 2022-07-08 MED ORDER — HYDROXYZINE HCL 25 MG PO TABS
25.0000 mg | ORAL_TABLET | Freq: Once | ORAL | Status: AC
  Administered 2022-07-08: 25 mg via ORAL
  Filled 2022-07-08: qty 1

## 2022-07-08 MED ORDER — SODIUM CHLORIDE 0.9% FLUSH
10.0000 mL | Freq: Once | INTRAVENOUS | Status: AC
  Administered 2022-07-08: 10 mL

## 2022-07-08 MED ORDER — SODIUM CHLORIDE 0.9 % IV SOLN: Freq: Once | INTRAVENOUS | Status: AC

## 2022-07-08 MED ORDER — SODIUM CHLORIDE 0.9% FLUSH
10.0000 mL | INTRAVENOUS | Status: DC | PRN
  Administered 2022-07-08: 10 mL

## 2022-07-08 MED ORDER — ACETAMINOPHEN 325 MG PO TABS
650.0000 mg | ORAL_TABLET | Freq: Once | ORAL | Status: AC
  Administered 2022-07-08: 650 mg via ORAL
  Filled 2022-07-08: qty 2

## 2022-07-08 NOTE — Patient Instructions (Signed)
[facility]  Discharge Instructions: Thank you for choosing [facility] to provide your oncology and hematology care.   If you have a lab appointment with the Cancer Center, please go directly to the Cancer Center and check in at the registration area.   Wear comfortable clothing and clothing appropriate for easy access to any Portacath or PICC line.   We strive to give you quality time with your provider. You may need to reschedule your appointment if you arrive late (15 or more minutes).  Arriving late affects you and other patients whose appointments are after yours.  Also, if you miss three or more appointments without notifying the office, you may be dismissed from the clinic at the provider's discretion.      For prescription refill requests, have your pharmacy contact our office and allow 72 hours for refills to be completed.    Today you received the following chemotherapy and/or immunotherapy agents: Daratumumab      To help prevent nausea and vomiting after your treatment, we encourage you to take your nausea medication as directed.  BELOW ARE SYMPTOMS THAT SHOULD BE REPORTED IMMEDIATELY: *FEVER GREATER THAN 100.4 F (38 C) OR HIGHER *CHILLS OR SWEATING *NAUSEA AND VOMITING THAT IS NOT CONTROLLED WITH YOUR NAUSEA MEDICATION *UNUSUAL SHORTNESS OF BREATH *UNUSUAL BRUISING OR BLEEDING *URINARY PROBLEMS (pain or burning when urinating, or frequent urination) *BOWEL PROBLEMS (unusual diarrhea, constipation, pain near the anus) TENDERNESS IN MOUTH AND THROAT WITH OR WITHOUT PRESENCE OF ULCERS (sore throat, sores in mouth, or a toothache) UNUSUAL RASH, SWELLING OR PAIN  UNUSUAL VAGINAL DISCHARGE OR ITCHING   Items with * indicate a potential emergency and should be followed up as soon as possible or go to the Emergency Department if any problems should occur.  Please show the CHEMOTHERAPY ALERT CARD or IMMUNOTHERAPY ALERT CARD at check-in  to the Emergency Department and triage nurse.  Should you have questions after your visit or need to cancel or reschedule your appointment, please contact [facility]  Dept: [phone]  and follow the prompts.  Office hours are 8:00 a.m. to 4:30 p.m. Monday - Friday. Please note that voicemails left after 4:00 p.m. may not be returned until the following business day.  We are closed weekends and major holidays. You have access to a nurse at all times for urgent questions. Please call the main number to the clinic Dept: [phone] and follow the prompts.   For any non-urgent questions, you may also contact your provider using MyChart. We now offer e-Visits for anyone 18 and older to request care online for non-urgent symptoms. For details visit [email or web address].   Also download the MyChart app! Go to the app store, search "MyChart", open the app, select [facility], and log in with your MyChart username and password.  Masks are optional in the cancer centers. If you would like for your care team to wear a mask while they are taking care of you, please let them know. You may have one support person who is at least 81 years old accompany you for your appointments. 

## 2022-07-08 NOTE — Progress Notes (Signed)
[facility]   [phone] [phone]   Clinic Follow up Note   Patient Care Team: [name], MD as PCP - General (Family Medicine) [name], MD as Consulting Physician (Hematology) Bond, [name], MD as Referring Physician (Ophthalmology) [name], [name], MD as Consulting Physician (Hematology and Oncology) [name], MD as Consulting Physician (Internal Medicine) [name], MD as Consulting Physician (General Surgery) [name], [facility] as Pharmacist (Pharmacist)  Date of Service: 07/08/22   CHIEF COMPLAINT:  Follow-up for continued evaluation and management of multiple myeloma   SUMMARY OF ONCOLOGIC HISTORY: Oncology History  Multiple myeloma not having achieved remission ([place])  12/12/2018 Initial Diagnosis   Multiple myeloma not having achieved remission ([place])   12/20/2018 - 10/04/2019 Chemotherapy   The patient had dexamethasone (DECADRON) tablet 20 mg, 20 mg (100 % of original dose 20 mg), Oral, Once, 14 of 14 cycles Dose modification: 20 mg (original dose 20 mg, Cycle 1), 10 mg (original dose 20 mg, Cycle 14) Administration: 20 mg (12/20/2018), 20 mg (12/27/2018), 20 mg (01/10/2019), 20 mg (01/17/2019), 20 mg (01/31/2019), 20 mg (02/07/2019), 20 mg (03/14/2019), 20 mg (03/21/2019), 20 mg (02/21/2019), 20 mg (02/28/2019), 20 mg (04/04/2019), 20 mg (04/11/2019), 20 mg (04/25/2019), 20 mg (05/02/2019), 20 mg (05/16/2019), 20 mg (05/23/2019), 20 mg (06/06/2019), 20 mg (06/13/2019), 20 mg (06/27/2019), 20 mg (07/04/2019), 20 mg (07/18/2019), 20 mg (07/25/2019), 20 mg (08/08/2019), 20 mg (08/15/2019), 20 mg (08/29/2019), 20 mg (09/05/2019), 10 mg (09/19/2019) lenalidomide (REVLIMID) 15 MG capsule, 1 of 1 cycle, Start date: 01/18/2019, End date: 02/21/2019 bortezomib SQ (VELCADE) chemo injection 2 mg, 1.3 mg/m2 = 2 mg, Subcutaneous,  Once, 14 of 14 cycles Administration: 2 mg (12/20/2018), 2 mg (12/27/2018), 2 mg (12/23/2018), 2 mg  (12/30/2018), 2 mg (01/10/2019), 2 mg (01/17/2019), 2 mg (01/13/2019), 2 mg (01/20/2019), 2 mg (01/31/2019), 2 mg (02/07/2019), 2 mg (02/03/2019), 2 mg (02/10/2019), 2 mg (03/14/2019), 2 mg (03/17/2019), 2 mg (03/21/2019), 2 mg (03/24/2019), 2 mg (02/21/2019), 2 mg (02/24/2019), 2 mg (02/28/2019), 2 mg (03/03/2019), 2 mg (04/04/2019), 2 mg (04/06/2019), 2 mg (04/11/2019), 2 mg (04/14/2019), 2 mg (04/25/2019), 2 mg (04/28/2019), 2 mg (05/02/2019), 2 mg (05/05/2019), 2 mg (05/16/2019), 2 mg (05/19/2019), 2 mg (05/23/2019), 2 mg (05/26/2019), 2 mg (06/06/2019), 2 mg (06/09/2019), 2 mg (06/13/2019), 2 mg (06/16/2019), 2 mg (06/27/2019), 2 mg (06/30/2019), 2 mg (07/04/2019), 2 mg (07/07/2019), 2 mg (07/18/2019), 2 mg (07/21/2019), 2 mg (07/25/2019), 2 mg (07/28/2019), 2 mg (08/08/2019), 2 mg (08/11/2019), 2 mg (08/15/2019), 2 mg (08/18/2019), 2 mg (08/29/2019), 2 mg (09/01/2019), 2 mg (09/05/2019), 2 mg (09/08/2019), 2 mg (09/19/2019), 2 mg (10/04/2019)  for chemotherapy treatment.    Multiple myeloma in relapse ([place])  04/16/2021 Initial Diagnosis   Multiple myeloma in relapse ([place])   04/30/2021 - 04/15/2022 Chemotherapy   Patient is on Treatment Plan : MYELOMA RELAPSED/REFRACTORY KCd q28d     05/12/2022 -  Chemotherapy   Patient is on Treatment Plan : MYELOMA Daratumumab + Pomalidomide + Dexamethasone q28d x 7 cycles       CURRENT THERAPY: Kyprolis and dexamethasone, started on 04/30/2021  INTERVAL HISTORY:  [facility] is a 81 y.o. female returns for continued evaluation and management of multiple myeloma. Patient was doing well at her last visit with me on 06/10/22.   She presents today for cycle 3 day 1 Daratumumab. She denies any toxicities associated with this treatment.  She denies any issues with leg swelling, new bones  pain, bloody stools, night sweats, muscle cramps, fever, chills, or rashes. She notes some mild fatigue the day of treatment and the following day. She has some loose stools.  She denies any problems with Zometa. She denies any  dental issues.  She states that she is only needing half a Percocet  in addition to her Fentanyl patch when she is more active that day.  She is taking 5000 units of Vitamin D daily.  She received the COVID booster last week without any complications. She has received this year's flu shot as well.  We reviewed her labs today.  MEDICAL HISTORY:  Past Medical History:  Diagnosis Date   Anemia    Glaucoma    HYPERTENSION 03/11/2007   Multiple myeloma ([place])    OSTEOPENIA 03/11/2007   Pre-diabetes    Thyroid cancer ([place])    Thyroid disease    Tubular adenoma of colon 07/2015    SURGICAL HISTORY: Past Surgical History:  Procedure Laterality Date   BONE MARROW BIOPSY     multiple   BREAST EXCISIONAL BIOPSY Right 2000   BREAST LUMPECTOMY  1990   benign   CATARACT EXTRACTION Bilateral 2018   DILATION AND CURETTAGE OF UTERUS     bleeding at menopause. No uterine cancer   IR IMAGING GUIDED PORT INSERTION  05/16/2021   IR RADIOLOGIST EVAL & MGMT  12/13/2018   THYROIDECTOMY N/A 08/02/2020   Procedure: TOTAL THYROIDECTOMY;  Surgeon: [name], MD;  Location: [facility];  Service: General;  Laterality: N/A;   TONSILLECTOMY     age 81   I have reviewed the social history and family history with the patient and they are unchanged from previous note.  ALLERGIES:  is allergic to ace inhibitors, benadryl [diphenhydramine], diamox [acetazolamide], sulfamethoxazole, lenalidomide, and penicillins.  MEDICATIONS:  Current Outpatient Medications  Medication Sig Dispense Refill   acyclovir (ZOVIRAX) 400 MG tablet Take 1 tablet (400 mg total) by mouth 2 (two) times daily. 60 tablet 3   ALPHAGAN P 0.1 % SOLN Place 1 drop into both eyes in the morning, at noon, and at bedtime.      amLODipine (NORVASC) 10 MG tablet Take 1 tablet (10 mg total) by mouth daily. 90 tablet 3   aspirin EC 81 MG tablet Take 81 mg by mouth daily.     Cholecalciferol (VITAMIN D3) 125 MCG (5000 UT) TABS Take 5,000 Units by  mouth daily.     Co-Enzyme Q-10 100 MG CAPS Take 100 mg by mouth daily.     dexamethasone (DECADRON) 4 MG tablet Take 5 tablets (20 mg total) by mouth daily. Take the day after daratumumab. Take with breakfast 20 tablet 11   dorzolamide-timolol (COSOPT) 22.3-6.8 MG/ML ophthalmic solution Place 1 drop into both eyes 2 (two) times daily.   11   fentaNYL (DURAGESIC) 12 MCG/HR Place 1 patch onto the skin every 3 (three) days. 10 patch 0   levothyroxine (SYNTHROID) 88 MCG tablet Take 1 tablet (88 mcg total) by mouth daily before breakfast. 90 tablet 3   lidocaine-prilocaine (EMLA) cream Apply to affected area once 30 g 3   LUMIGAN 0.01 % SOLN 1 drop at bedtime.     magnesium chloride (SLOW-MAG) 64 MG TBEC SR tablet Take by mouth.     Multiple Vitamins-Minerals (MULTIVITAMIN WITH MINERALS) tablet Take 1 tablet by mouth daily. Centrum Silver 50 +     Netarsudil-Latanoprost (ROCKLATAN) 0.02-0.005 % SOLN Place 1 drop into both eyes daily.     Omega-3 1000  MG CAPS Take 1,000 mg by mouth daily.      ondansetron (ZOFRAN) 8 MG tablet Take 1 tablet (8 mg total) by mouth every 8 (eight) hours as needed for nausea or vomiting. (Patient not taking: Reported on 06/16/2022) 30 tablet 1   oxyCODONE-acetaminophen (PERCOCET) 5-325 MG tablet Take 1-2 tablets by mouth every 4 (four) hours as needed for moderate pain or severe pain. 90 tablet 0   polyethylene glycol (MIRALAX) packet Take 17 g by mouth daily. 30 each 1   pomalidomide (POMALYST) 2 MG capsule Take 1 capsule (2 mg total) by mouth daily. Take for 21 days on, 7 days off, repeat every 28 days 21 capsule 0   prochlorperazine (COMPAZINE) 10 MG tablet Take 1 tablet (10 mg total) by mouth every 6 (six) hours as needed for nausea or vomiting. (Patient not taking: Reported on 06/16/2022) 30 tablet 1   senna-docusate (SENOKOT-S) 8.6-50 MG tablet Take 2 tablets by mouth at bedtime. 60 tablet 0   Simethicone (GAS-X PO) Take 1 tablet by mouth as needed.     vitamin C  (ASCORBIC ACID) 500 MG tablet Take 500 mg by mouth 2 (two) times a week.      No current facility-administered medications for this visit.    PHYSICAL EXAMINATION:. .BP (!) 170/72 (BP Location: Left Arm, Patient Position: Sitting)   Pulse 61   Temp 98.1 F (36.7 C) (Tympanic)   Resp 18   Ht 5' (1.524 m)   Wt 103 lb 3.2 oz (46.8 kg)   SpO2 100%   BMI 20.15 kg/m   NAD GENERAL:alert, in no acute distress and comfortable SKIN: no acute rashes, no significant lesions EYES: conjunctiva are pink and non-injected, sclera anicteric NECK: supple, no JVD LYMPH:  no palpable lymphadenopathy in the cervical, axillary or inguinal regions LUNGS: clear to auscultation b/l with normal respiratory effort HEART: regular rate & rhythm ABDOMEN:  normoactive bowel sounds , non tender, not distended. Extremity: no pedal edema PSYCH: alert & oriented x 3 with fluent speech NEURO: no focal motor/sensory deficits  LABORATORY DATA:   .    Latest Ref Rng & Units 07/08/2022    9:18 AM 06/30/2022    8:35 AM 06/23/2022    8:21 AM  CBC  WBC 4.0 - 10.5 K/uL 2.6  3.0  4.6   Hemoglobin 12.0 - 15.0 g/dL 10.9  10.8  11.3   Hematocrit 36.0 - 46.0 % 32.2  32.1  32.7   Platelets 150 - 400 K/uL 266  181  206       Latest Ref Rng & Units 06/30/2022    8:35 AM 06/23/2022    8:21 AM 06/16/2022    8:45 AM  CMP  Glucose 70 - 99 mg/dL 97  100  122   BUN 8 - 23 mg/dL _0 Creatinine 0.44 - 1.00 mg/dL 0.60  0.61  0.68   Sodium 135 - 145 mmol/L 138  140  137   Potassium 3.5 - 5.1 mmol/L 4.2  4.2  4.3   Chloride 98 - 111 mmol/L 106  107  104   CO2 22 - 32 mmol/L _1 Calcium 8.9 - 10.3 mg/dL 8.6  8.7  9.2   Total Protein 6.5 - 8.1 g/dL 6.3  6.4  6.6   Total Bilirubin 0.3 - 1.2 mg/dL 0.4  0.4  0.4   Alkaline Phos 38 - 126 U/L 22  24  25  AST 15 - 41 U/L _0 ALT 0 - 44 U/L _1 RADIOGRAPHIC STUDIES: I have personally reviewed the radiological images as listed and agreed  with the findings in the report. No results found.   ASSESSMENT & PLAN:  [facility] is a 81 y.o. female who presents for a follow up for multiple myeloma.   1. 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  She achieved complete response with negative M protein and negative PET scan in December 2020. -She subsequently went on Ninlaro maintenance therapy -PET scan from May 08, 2021, which showed focal activity within the right femur, consistent with active multiple myeloma, no other hypermetabolic lesion or plasmacytoma. -05/06/2021 bone marrow biopsy, which showed 3% plasma cells. -Recommend Zometa every 2 months. -Last myeloma labs from 06/30/22 show her M protein is down to 0.6.  2. Mild anemia -We will continue to monitor with myeloma treatment and current medications -CBC shows WBC of 2.6 today 07/08/22. Chronic anemia, HGB at 10.9 stable. Platelets improved to 266. Date of labs: 07/08/22.  3.  Cancer related pain  -Currently well controlled -Continue fentanyl patch at 12 mcg/h and Percocet for break through pain.    PLAN Here for cycle 3 day 1 of daratumumab pomalidomide chemotherapy. Labs done today were reviewed with her in detail and are stable. M spike down from 1.4 to 0.6g/dl No significant new toxicities from her current plan of treatment.DPd Medication refills done as requested. Reduce Dexamethasone to 42m post treatment. If tolerated well we will reduce dose to 847mafter next treatment. Recommended RSV vaccine, flu shot and new covid 19 vaccinations.    Follow-up -RTC with Dr. KaIrene Limbon 1 month Plz schedule Dara per orders  The total time spent in the appointment was 30 minutes* .  All of the patient's questions were answered with apparent satisfaction. The patient knows to call the clinic with any  problems, questions or concerns.   GaSullivan LoneD MS AAHIVMS SCPho[facility] Centerematology/Oncology Physician CoCentura Health-[facility].*Total Encounter Time as defined by the Centers for Medicare and Medicaid Services includes, in addition to the face-to-face time of a patient visit (documented in the note above) non-face-to-face time: obtaining and reviewing outside history, ordering and reviewing medications, tests or procedures, care coordination (communications with other health care professionals or caregivers) and documentation in the medical record.   I, AlEugene Gaviaam acting as scribe for Dr. GaSullivan LoneMD  .I have reviewed the above documentation for accuracy and completeness, and I agree with the above. .GBrunetta GeneraD

## 2022-07-08 NOTE — Progress Notes (Signed)
Per Dr. Irene Limbo ok to proceed with D1C3 Darzalex rapid with ANC 1.3.

## 2022-07-09 ENCOUNTER — Other Ambulatory Visit: Payer: Self-pay | Admitting: Hematology

## 2022-07-09 DIAGNOSIS — C9002 Multiple myeloma in relapse: Secondary | ICD-10-CM

## 2022-07-09 DIAGNOSIS — Z7189 Other specified counseling: Secondary | ICD-10-CM

## 2022-07-09 NOTE — Progress Notes (Signed)
[facility] Go-Live Orders updated

## 2022-07-14 ENCOUNTER — Encounter: Payer: Self-pay | Admitting: Hematology

## 2022-07-17 ENCOUNTER — Other Ambulatory Visit: Payer: Self-pay

## 2022-07-17 DIAGNOSIS — C9000 Multiple myeloma not having achieved remission: Secondary | ICD-10-CM

## 2022-07-20 ENCOUNTER — Encounter: Payer: Self-pay | Admitting: Hematology

## 2022-07-20 MED ORDER — FENTANYL 12 MCG/HR TD PT72: 1.0000 | MEDICATED_PATCH | TRANSDERMAL | 0 refills | Status: DC

## 2022-07-20 MED FILL — Dexamethasone Sodium Phosphate Inj 100 MG/10ML: INTRAMUSCULAR | Qty: 2 | Status: AC

## 2022-07-21 ENCOUNTER — Other Ambulatory Visit: Payer: Self-pay

## 2022-07-21 ENCOUNTER — Inpatient Hospital Stay: Payer: Medicare HMO

## 2022-07-21 VITALS — BP 151/74 | HR 61 | Temp 98.0°F | Resp 18 | Wt 105.8 lb

## 2022-07-21 DIAGNOSIS — Z79624 Long term (current) use of inhibitors of nucleotide synthesis: Secondary | ICD-10-CM | POA: Diagnosis not present

## 2022-07-21 DIAGNOSIS — C9002 Multiple myeloma in relapse: Secondary | ICD-10-CM

## 2022-07-21 DIAGNOSIS — Z79890 Hormone replacement therapy (postmenopausal): Secondary | ICD-10-CM | POA: Diagnosis not present

## 2022-07-21 DIAGNOSIS — Z7189 Other specified counseling: Secondary | ICD-10-CM

## 2022-07-21 DIAGNOSIS — C9000 Multiple myeloma not having achieved remission: Secondary | ICD-10-CM

## 2022-07-21 DIAGNOSIS — Z85850 Personal history of malignant neoplasm of thyroid: Secondary | ICD-10-CM | POA: Diagnosis not present

## 2022-07-21 DIAGNOSIS — G893 Neoplasm related pain (acute) (chronic): Secondary | ICD-10-CM | POA: Diagnosis not present

## 2022-07-21 DIAGNOSIS — Z5112 Encounter for antineoplastic immunotherapy: Secondary | ICD-10-CM | POA: Diagnosis not present

## 2022-07-21 DIAGNOSIS — I10 Essential (primary) hypertension: Secondary | ICD-10-CM | POA: Diagnosis not present

## 2022-07-21 DIAGNOSIS — Z95828 Presence of other vascular implants and grafts: Secondary | ICD-10-CM

## 2022-07-21 DIAGNOSIS — Z7961 Long term (current) use of immunomodulator: Secondary | ICD-10-CM | POA: Diagnosis not present

## 2022-07-21 DIAGNOSIS — D649 Anemia, unspecified: Secondary | ICD-10-CM | POA: Diagnosis not present

## 2022-07-21 DIAGNOSIS — Z7982 Long term (current) use of aspirin: Secondary | ICD-10-CM | POA: Diagnosis not present

## 2022-07-21 DIAGNOSIS — R5383 Other fatigue: Secondary | ICD-10-CM | POA: Diagnosis not present

## 2022-07-21 LAB — CBC WITH DIFFERENTIAL (CANCER CENTER ONLY)
Abs Immature Granulocytes: 0.04 K/uL (ref 0.00–0.07)
Basophils Absolute: 0.1 K/uL (ref 0.0–0.1)
Basophils Relative: 2 %
Eosinophils Absolute: 0.3 K/uL (ref 0.0–0.5)
Eosinophils Relative: 5 %
HCT: 32.8 % — ABNORMAL LOW (ref 36.0–46.0)
Hemoglobin: 10.9 g/dL — ABNORMAL LOW (ref 12.0–15.0)
Immature Granulocytes: 1 %
Lymphocytes Relative: 4 %
Lymphs Abs: 0.3 K/uL — ABNORMAL LOW (ref 0.7–4.0)
MCH: 34.2 pg — ABNORMAL HIGH (ref 26.0–34.0)
MCHC: 33.2 g/dL (ref 30.0–36.0)
MCV: 102.8 fL — ABNORMAL HIGH (ref 80.0–100.0)
Monocytes Absolute: 0.9 K/uL (ref 0.1–1.0)
Monocytes Relative: 15 %
Neutro Abs: 4.3 K/uL (ref 1.7–7.7)
Neutrophils Relative %: 73 %
Platelet Count: 215 K/uL (ref 150–400)
RBC: 3.19 MIL/uL — ABNORMAL LOW (ref 3.87–5.11)
RDW: 13.1 % (ref 11.5–15.5)
WBC Count: 5.9 K/uL (ref 4.0–10.5)
nRBC: 0 % (ref 0.0–0.2)

## 2022-07-21 LAB — CMP (CANCER CENTER ONLY)
ALT: 12 U/L (ref 0–44)
AST: 12 U/L — ABNORMAL LOW (ref 15–41)
Albumin: 3.9 g/dL (ref 3.5–5.0)
Alkaline Phosphatase: 27 U/L — ABNORMAL LOW (ref 38–126)
Anion gap: 5 (ref 5–15)
BUN: 13 mg/dL (ref 8–23)
CO2: 28 mmol/L (ref 22–32)
Calcium: 8.9 mg/dL (ref 8.9–10.3)
Chloride: 107 mmol/L (ref 98–111)
Creatinine: 0.6 mg/dL (ref 0.44–1.00)
GFR, Estimated: >60 mL/min
Glucose, Bld: 108 mg/dL — ABNORMAL HIGH (ref 70–99)
Potassium: 4.1 mmol/L (ref 3.5–5.1)
Sodium: 140 mmol/L (ref 135–145)
Total Bilirubin: 0.4 mg/dL (ref 0.3–1.2)
Total Protein: 6.2 g/dL — ABNORMAL LOW (ref 6.5–8.1)

## 2022-07-21 MED ORDER — HEPARIN SOD (PORK) LOCK FLUSH 100 UNIT/ML IV SOLN
500.0000 [IU] | Freq: Once | INTRAVENOUS | Status: AC | PRN
  Administered 2022-07-21: 500 [IU]

## 2022-07-21 MED ORDER — ACETAMINOPHEN 325 MG PO TABS
650.0000 mg | ORAL_TABLET | Freq: Once | ORAL | Status: AC
  Administered 2022-07-21: 650 mg via ORAL
  Filled 2022-07-21: qty 2

## 2022-07-21 MED ORDER — SODIUM CHLORIDE 0.9% FLUSH
10.0000 mL | INTRAVENOUS | Status: DC | PRN
  Administered 2022-07-21: 10 mL

## 2022-07-21 MED ORDER — SODIUM CHLORIDE 0.9 % IV SOLN: 16.0000 mg/kg | Freq: Once | INTRAVENOUS | Status: DC

## 2022-07-21 MED ORDER — HYDROXYZINE HCL 25 MG PO TABS
25.0000 mg | ORAL_TABLET | Freq: Once | ORAL | Status: AC
  Administered 2022-07-21: 25 mg via ORAL
  Filled 2022-07-21: qty 1

## 2022-07-21 MED ORDER — FAMOTIDINE IN NACL 20-0.9 MG/50ML-% IV SOLN
20.0000 mg | Freq: Once | INTRAVENOUS | Status: AC
  Administered 2022-07-21: 20 mg via INTRAVENOUS
  Filled 2022-07-21: qty 50

## 2022-07-21 MED ORDER — POMALIDOMIDE 2 MG PO CAPS: 2.0000 mg | ORAL_CAPSULE | Freq: Every day | ORAL | 0 refills | Status: DC

## 2022-07-21 MED ORDER — SODIUM CHLORIDE 0.9 % IV SOLN
16.0000 mg/kg | Freq: Once | INTRAVENOUS | Status: AC
  Administered 2022-07-21: 700 mg via INTRAVENOUS
  Filled 2022-07-21: qty 20

## 2022-07-21 MED ORDER — SODIUM CHLORIDE 0.9 % IV SOLN: Freq: Once | INTRAVENOUS | Status: AC

## 2022-07-21 MED ORDER — SODIUM CHLORIDE 0.9 % IV SOLN
20.0000 mg | Freq: Once | INTRAVENOUS | Status: AC
  Administered 2022-07-21: 20 mg via INTRAVENOUS
  Filled 2022-07-21: qty 20

## 2022-07-21 NOTE — Patient Instructions (Signed)
[facility]  Discharge Instructions: Thank you for choosing [facility] to provide your oncology and hematology care.   If you have a lab appointment with the Cancer Center, please go directly to the Cancer Center and check in at the registration area.   Wear comfortable clothing and clothing appropriate for easy access to any Portacath or PICC line.   We strive to give you quality time with your provider. You may need to reschedule your appointment if you arrive late (15 or more minutes).  Arriving late affects you and other patients whose appointments are after yours.  Also, if you miss three or more appointments without notifying the office, you may be dismissed from the clinic at the provider's discretion.      For prescription refill requests, have your pharmacy contact our office and allow 72 hours for refills to be completed.    Today you received the following chemotherapy and/or immunotherapy agents: Darzalex      To help prevent nausea and vomiting after your treatment, we encourage you to take your nausea medication as directed.  BELOW ARE SYMPTOMS THAT SHOULD BE REPORTED IMMEDIATELY: *FEVER GREATER THAN 100.4 F (38 C) OR HIGHER *CHILLS OR SWEATING *NAUSEA AND VOMITING THAT IS NOT CONTROLLED WITH YOUR NAUSEA MEDICATION *UNUSUAL SHORTNESS OF BREATH *UNUSUAL BRUISING OR BLEEDING *URINARY PROBLEMS (pain or burning when urinating, or frequent urination) *BOWEL PROBLEMS (unusual diarrhea, constipation, pain near the anus) TENDERNESS IN MOUTH AND THROAT WITH OR WITHOUT PRESENCE OF ULCERS (sore throat, sores in mouth, or a toothache) UNUSUAL RASH, SWELLING OR PAIN  UNUSUAL VAGINAL DISCHARGE OR ITCHING   Items with * indicate a potential emergency and should be followed up as soon as possible or go to the Emergency Department if any problems should occur.  Please show the CHEMOTHERAPY ALERT CARD or IMMUNOTHERAPY ALERT CARD at check-in to  the Emergency Department and triage nurse.  Should you have questions after your visit or need to cancel or reschedule your appointment, please contact [facility]  Dept: [phone]  and follow the prompts.  Office hours are 8:00 a.m. to 4:30 p.m. Monday - Friday. Please note that voicemails left after 4:00 p.m. may not be returned until the following business day.  We are closed weekends and major holidays. You have access to a nurse at all times for urgent questions. Please call the main number to the clinic Dept: [phone] and follow the prompts.   For any non-urgent questions, you may also contact your provider using MyChart. We now offer e-Visits for anyone 18 and older to request care online for non-urgent symptoms. For details visit [email or web address].   Also download the MyChart app! Go to the app store, search "MyChart", open the app, select [facility], and log in with your MyChart username and password.  Masks are optional in the cancer centers. If you would like for your care team to wear a mask while they are taking care of you, please let them know. You may have one support person who is at least 81 years old accompany you for your appointments. 

## 2022-07-25 DIAGNOSIS — H401122 Primary open-angle glaucoma, left eye, moderate stage: Secondary | ICD-10-CM | POA: Diagnosis not present

## 2022-07-25 DIAGNOSIS — H401112 Primary open-angle glaucoma, right eye, moderate stage: Secondary | ICD-10-CM | POA: Diagnosis not present

## 2022-07-27 LAB — MULTIPLE MYELOMA PANEL, SERUM
Albumin SerPl Elph-Mcnc: 3.4 g/dL (ref 2.9–4.4)
Albumin/Glob SerPl: 1.6 (ref 0.7–1.7)
Alpha 1: 0.2 g/dL (ref 0.0–0.4)
Alpha2 Glob SerPl Elph-Mcnc: 0.6 g/dL (ref 0.4–1.0)
B-Globulin SerPl Elph-Mcnc: 1.2 g/dL (ref 0.7–1.3)
Gamma Glob SerPl Elph-Mcnc: 0.1 g/dL — ABNORMAL LOW (ref 0.4–1.8)
Globulin, Total: 2.2 g/dL (ref 2.2–3.9)
IgA: 14 mg/dL — ABNORMAL LOW (ref 64–422)
IgG (Immunoglobin G), Serum: 772 mg/dL (ref 586–1602)
IgM (Immunoglobulin M), Srm: 12 mg/dL — ABNORMAL LOW (ref 26–217)
M Protein SerPl Elph-Mcnc: 0.5 g/dL — ABNORMAL HIGH
Total Protein ELP: 5.6 g/dL — ABNORMAL LOW (ref 6.0–8.5)

## 2022-07-30 ENCOUNTER — Other Ambulatory Visit: Payer: Self-pay

## 2022-07-30 DIAGNOSIS — Z7189 Other specified counseling: Secondary | ICD-10-CM

## 2022-07-30 DIAGNOSIS — C9002 Multiple myeloma in relapse: Secondary | ICD-10-CM

## 2022-07-30 MED ORDER — ACYCLOVIR 400 MG PO TABS: 400.0000 mg | ORAL_TABLET | Freq: Two times a day (BID) | ORAL | 3 refills | Status: DC

## 2022-08-03 ENCOUNTER — Encounter: Payer: Self-pay | Admitting: Hematology

## 2022-08-03 MED FILL — Dexamethasone Sodium Phosphate Inj 100 MG/10ML: INTRAMUSCULAR | Qty: 2 | Status: AC

## 2022-08-03 NOTE — Progress Notes (Signed)
Called pt to discuss the [place] to help w/ OOP cost for oral medications.  I left a msg requesting she return my call if she's interested in apply.

## 2022-08-04 ENCOUNTER — Inpatient Hospital Stay (HOSPITAL_BASED_OUTPATIENT_CLINIC_OR_DEPARTMENT_OTHER): Payer: Medicare HMO | Admitting: Hematology

## 2022-08-04 ENCOUNTER — Inpatient Hospital Stay: Payer: Medicare HMO

## 2022-08-04 ENCOUNTER — Other Ambulatory Visit: Payer: Self-pay

## 2022-08-04 VITALS — BP 171/71 | HR 63 | Temp 98.0°F | Resp 15 | Ht 60.0 in | Wt 106.4 lb

## 2022-08-04 VITALS — BP 135/76 | HR 66 | Temp 98.2°F | Resp 16

## 2022-08-04 DIAGNOSIS — Z7982 Long term (current) use of aspirin: Secondary | ICD-10-CM | POA: Diagnosis not present

## 2022-08-04 DIAGNOSIS — Z7961 Long term (current) use of immunomodulator: Secondary | ICD-10-CM | POA: Diagnosis not present

## 2022-08-04 DIAGNOSIS — Z7189 Other specified counseling: Secondary | ICD-10-CM

## 2022-08-04 DIAGNOSIS — I10 Essential (primary) hypertension: Secondary | ICD-10-CM | POA: Diagnosis not present

## 2022-08-04 DIAGNOSIS — Z5112 Encounter for antineoplastic immunotherapy: Secondary | ICD-10-CM | POA: Diagnosis not present

## 2022-08-04 DIAGNOSIS — Z79890 Hormone replacement therapy (postmenopausal): Secondary | ICD-10-CM | POA: Diagnosis not present

## 2022-08-04 DIAGNOSIS — C9000 Multiple myeloma not having achieved remission: Secondary | ICD-10-CM | POA: Diagnosis not present

## 2022-08-04 DIAGNOSIS — C9002 Multiple myeloma in relapse: Secondary | ICD-10-CM

## 2022-08-04 DIAGNOSIS — R5383 Other fatigue: Secondary | ICD-10-CM | POA: Diagnosis not present

## 2022-08-04 DIAGNOSIS — G893 Neoplasm related pain (acute) (chronic): Secondary | ICD-10-CM | POA: Diagnosis not present

## 2022-08-04 DIAGNOSIS — Z85850 Personal history of malignant neoplasm of thyroid: Secondary | ICD-10-CM | POA: Diagnosis not present

## 2022-08-04 DIAGNOSIS — Z95828 Presence of other vascular implants and grafts: Secondary | ICD-10-CM

## 2022-08-04 DIAGNOSIS — D649 Anemia, unspecified: Secondary | ICD-10-CM | POA: Diagnosis not present

## 2022-08-04 DIAGNOSIS — Z79624 Long term (current) use of inhibitors of nucleotide synthesis: Secondary | ICD-10-CM | POA: Diagnosis not present

## 2022-08-04 LAB — CBC WITH DIFFERENTIAL (CANCER CENTER ONLY)
Abs Immature Granulocytes: 0 10*3/uL (ref 0.00–0.07)
Basophils Absolute: 0.2 10*3/uL — ABNORMAL HIGH (ref 0.0–0.1)
Basophils Relative: 6 %
Eosinophils Absolute: 0.1 10*3/uL (ref 0.0–0.5)
Eosinophils Relative: 5 %
HCT: 32.7 % — ABNORMAL LOW (ref 36.0–46.0)
Hemoglobin: 11.1 g/dL — ABNORMAL LOW (ref 12.0–15.0)
Immature Granulocytes: 0 %
Lymphocytes Relative: 15 %
Lymphs Abs: 0.4 10*3/uL — ABNORMAL LOW (ref 0.7–4.0)
MCH: 34.2 pg — ABNORMAL HIGH (ref 26.0–34.0)
MCHC: 33.9 g/dL (ref 30.0–36.0)
MCV: 100.6 fL — ABNORMAL HIGH (ref 80.0–100.0)
Monocytes Absolute: 0.6 10*3/uL (ref 0.1–1.0)
Monocytes Relative: 21 %
Neutro Abs: 1.4 10*3/uL — ABNORMAL LOW (ref 1.7–7.7)
Neutrophils Relative %: 53 %
Platelet Count: 269 10*3/uL (ref 150–400)
RBC: 3.25 MIL/uL — ABNORMAL LOW (ref 3.87–5.11)
RDW: 13.2 % (ref 11.5–15.5)
Smear Review: NORMAL
WBC Count: 2.7 10*3/uL — ABNORMAL LOW (ref 4.0–10.5)
nRBC: 0 % (ref 0.0–0.2)

## 2022-08-04 LAB — CMP (CANCER CENTER ONLY)
ALT: 12 U/L (ref 0–44)
AST: 13 U/L — ABNORMAL LOW (ref 15–41)
Albumin: 4 g/dL (ref 3.5–5.0)
Alkaline Phosphatase: 24 U/L — ABNORMAL LOW (ref 38–126)
Anion gap: 6 (ref 5–15)
BUN: 16 mg/dL (ref 8–23)
CO2: 27 mmol/L (ref 22–32)
Calcium: 8.8 mg/dL — ABNORMAL LOW (ref 8.9–10.3)
Chloride: 107 mmol/L (ref 98–111)
Creatinine: 0.65 mg/dL (ref 0.44–1.00)
GFR, Estimated: >60 mL/min (ref >60)
Glucose, Bld: 96 mg/dL (ref 70–99)
Potassium: 4.2 mmol/L (ref 3.5–5.1)
Sodium: 140 mmol/L (ref 135–145)
Total Bilirubin: 0.3 mg/dL (ref 0.3–1.2)
Total Protein: 6.3 g/dL — ABNORMAL LOW (ref 6.5–8.1)

## 2022-08-04 MED ORDER — HEPARIN SOD (PORK) LOCK FLUSH 100 UNIT/ML IV SOLN
500.0000 [IU] | Freq: Once | INTRAVENOUS | Status: AC | PRN
  Administered 2022-08-04: 500 [IU]

## 2022-08-04 MED ORDER — HYDROXYZINE HCL 25 MG PO TABS
25.0000 mg | ORAL_TABLET | Freq: Once | ORAL | Status: AC
  Administered 2022-08-04: 25 mg via ORAL
  Filled 2022-08-04: qty 1

## 2022-08-04 MED ORDER — SODIUM CHLORIDE 0.9 % IV SOLN: Freq: Once | INTRAVENOUS | Status: AC

## 2022-08-04 MED ORDER — ACETAMINOPHEN 325 MG PO TABS
650.0000 mg | ORAL_TABLET | Freq: Once | ORAL | Status: AC
  Administered 2022-08-04: 650 mg via ORAL
  Filled 2022-08-04: qty 2

## 2022-08-04 MED ORDER — SODIUM CHLORIDE 0.9% FLUSH
10.0000 mL | Freq: Once | INTRAVENOUS | Status: AC
  Administered 2022-08-04: 10 mL

## 2022-08-04 MED ORDER — SODIUM CHLORIDE 0.9 % IV SOLN
20.0000 mg | Freq: Once | INTRAVENOUS | Status: AC
  Administered 2022-08-04: 20 mg via INTRAVENOUS
  Filled 2022-08-04: qty 20

## 2022-08-04 MED ORDER — SODIUM CHLORIDE 0.9% FLUSH
10.0000 mL | INTRAVENOUS | Status: DC | PRN
  Administered 2022-08-04: 10 mL

## 2022-08-04 MED ORDER — SODIUM CHLORIDE 0.9 % IV SOLN
16.0000 mg/kg | Freq: Once | INTRAVENOUS | Status: AC
  Administered 2022-08-04: 700 mg via INTRAVENOUS
  Filled 2022-08-04: qty 20

## 2022-08-04 MED ORDER — FAMOTIDINE IN NACL 20-0.9 MG/50ML-% IV SOLN
20.0000 mg | Freq: Once | INTRAVENOUS | Status: AC
  Administered 2022-08-04: 20 mg via INTRAVENOUS
  Filled 2022-08-04: qty 50

## 2022-08-04 NOTE — Patient Instructions (Signed)
[facility]  Discharge Instructions: Thank you for choosing [facility] to provide your oncology and hematology care.   If you have a lab appointment with the [facility], please go directly to the [facility] and check in at the registration area.   Wear comfortable clothing and clothing appropriate for easy access to any Portacath or PICC line.   We strive to give you quality time with your provider. You may need to reschedule your appointment if you arrive late (15 or more minutes).  Arriving late affects you and other patients whose appointments are after yours.  Also, if you miss three or more appointments without notifying the office, you may be dismissed from the clinic at the provider's discretion.      For prescription refill requests, have your pharmacy contact our office and allow 72 hours for refills to be completed.    Today you received the following chemotherapy and/or immunotherapy agents: Darzalex      To help prevent nausea and vomiting after your treatment, we encourage you to take your nausea medication as directed.  BELOW ARE SYMPTOMS THAT SHOULD BE REPORTED IMMEDIATELY: *FEVER GREATER THAN 100.4 F (38 C) OR HIGHER *CHILLS OR SWEATING *NAUSEA AND VOMITING THAT IS NOT CONTROLLED WITH YOUR NAUSEA MEDICATION *UNUSUAL SHORTNESS OF BREATH *UNUSUAL BRUISING OR BLEEDING *URINARY PROBLEMS (pain or burning when urinating, or frequent urination) *BOWEL PROBLEMS (unusual diarrhea, constipation, pain near the anus) TENDERNESS IN MOUTH AND THROAT WITH OR WITHOUT PRESENCE OF ULCERS (sore throat, sores in mouth, or a toothache) UNUSUAL RASH, SWELLING OR PAIN  UNUSUAL VAGINAL DISCHARGE OR ITCHING   Items with * indicate a potential emergency and should be followed up as soon as possible or go to the Emergency Department if any problems should occur.  Please show the CHEMOTHERAPY ALERT CARD or IMMUNOTHERAPY ALERT CARD at check-in to  the Emergency Department and triage nurse.  Should you have questions after your visit or need to cancel or reschedule your appointment, please contact [facility]  Dept: [phone]  and follow the prompts.  Office hours are 8:00 a.m. to 4:30 p.m. Monday - Friday. Please note that voicemails left after 4:00 p.m. may not be returned until the following business day.  We are closed weekends and major holidays. You have access to a nurse at all times for urgent questions. Please call the main number to the clinic Dept: [phone] and follow the prompts.   For any non-urgent questions, you may also contact your provider using MyChart. We now offer e-Visits for anyone 18 and older to request care online for non-urgent symptoms. For details visit [email or web address].   Also download the MyChart app! Go to the app store, search "MyChart", open the app, select , and log in with your MyChart username and password.  Masks are optional in the cancer centers. If you would like for your care team to wear a mask while they are taking care of you, please let them know. You may have one support person who is at least 81 years old accompany you for your appointments.

## 2022-08-04 NOTE — Progress Notes (Signed)
[facility]   [phone] [phone]   Clinic Follow up Note   Patient Care Team: [name], MD as PCP - General (Family Medicine) [name], MD as Consulting Physician (Hematology) Bond, [name], MD as Referring Physician (Ophthalmology) [name], [name], MD as Consulting Physician (Hematology and Oncology) [name], MD as Consulting Physician (Internal Medicine) [name], MD as Consulting Physician (General Surgery) [name], [facility] as Pharmacist (Pharmacist)  Date of Service: 08/04/2022  CHIEF COMPLAINT:  Follow-up for continued evaluation and management of multiple myeloma   SUMMARY OF ONCOLOGIC HISTORY: Oncology History  Multiple myeloma not having achieved remission ([place])  12/12/2018 Initial Diagnosis   Multiple myeloma not having achieved remission ([place])   12/20/2018 - 10/04/2019 Chemotherapy   The patient had dexamethasone (DECADRON) tablet 20 mg, 20 mg (100 % of original dose 20 mg), Oral, Once, 14 of 14 cycles Dose modification: 20 mg (original dose 20 mg, Cycle 1), 10 mg (original dose 20 mg, Cycle 14) Administration: 20 mg (12/20/2018), 20 mg (12/27/2018), 20 mg (01/10/2019), 20 mg (01/17/2019), 20 mg (01/31/2019), 20 mg (02/07/2019), 20 mg (03/14/2019), 20 mg (03/21/2019), 20 mg (02/21/2019), 20 mg (02/28/2019), 20 mg (04/04/2019), 20 mg (04/11/2019), 20 mg (04/25/2019), 20 mg (05/02/2019), 20 mg (05/16/2019), 20 mg (05/23/2019), 20 mg (06/06/2019), 20 mg (06/13/2019), 20 mg (06/27/2019), 20 mg (07/04/2019), 20 mg (07/18/2019), 20 mg (07/25/2019), 20 mg (08/08/2019), 20 mg (08/15/2019), 20 mg (08/29/2019), 20 mg (09/05/2019), 10 mg (09/19/2019) lenalidomide (REVLIMID) 15 MG capsule, 1 of 1 cycle, Start date: 01/18/2019, End date: 02/21/2019 bortezomib SQ (VELCADE) chemo injection 2 mg, 1.3 mg/m2 = 2 mg, Subcutaneous,  Once, 14 of 14 cycles Administration: 2 mg (12/20/2018), 2 mg (12/27/2018), 2 mg (12/23/2018), 2 mg  (12/30/2018), 2 mg (01/10/2019), 2 mg (01/17/2019), 2 mg (01/13/2019), 2 mg (01/20/2019), 2 mg (01/31/2019), 2 mg (02/07/2019), 2 mg (02/03/2019), 2 mg (02/10/2019), 2 mg (03/14/2019), 2 mg (03/17/2019), 2 mg (03/21/2019), 2 mg (03/24/2019), 2 mg (02/21/2019), 2 mg (02/24/2019), 2 mg (02/28/2019), 2 mg (03/03/2019), 2 mg (04/04/2019), 2 mg (04/06/2019), 2 mg (04/11/2019), 2 mg (04/14/2019), 2 mg (04/25/2019), 2 mg (04/28/2019), 2 mg (05/02/2019), 2 mg (05/05/2019), 2 mg (05/16/2019), 2 mg (05/19/2019), 2 mg (05/23/2019), 2 mg (05/26/2019), 2 mg (06/06/2019), 2 mg (06/09/2019), 2 mg (06/13/2019), 2 mg (06/16/2019), 2 mg (06/27/2019), 2 mg (06/30/2019), 2 mg (07/04/2019), 2 mg (07/07/2019), 2 mg (07/18/2019), 2 mg (07/21/2019), 2 mg (07/25/2019), 2 mg (07/28/2019), 2 mg (08/08/2019), 2 mg (08/11/2019), 2 mg (08/15/2019), 2 mg (08/18/2019), 2 mg (08/29/2019), 2 mg (09/01/2019), 2 mg (09/05/2019), 2 mg (09/08/2019), 2 mg (09/19/2019), 2 mg (10/04/2019)  for chemotherapy treatment.    Multiple myeloma in relapse ([place])  04/16/2021 Initial Diagnosis   Multiple myeloma in relapse ([place])   04/30/2021 - 04/15/2022 Chemotherapy   Patient is on Treatment Plan : MYELOMA RELAPSED/REFRACTORY KCd q28d     05/12/2022 - 07/08/2022 Chemotherapy   Patient is on Treatment Plan : MYELOMA Daratumumab + Pomalidomide + Dexamethasone q28d x 7 cycles     05/22/2022 -  Chemotherapy   Patient is on Treatment Plan : MYELOMA Daratumumab IV + Pomalidomide + Dexamethasone q28d x 7 cycles       CURRENT THERAPY: Kyprolis and dexamethasone, started on 04/30/2021  INTERVAL HISTORY:  Megan Olson is a 81 y.o. female returns for continued evaluation and management of multiple myeloma. She reports She is doing well with no new symptoms or concerns.  She notes  some muscle tightness in lower extremities.  She presents today for cycle 4 day 1 Daratumumab. She denies any toxicities associated with this treatment.  No fever, chills, night sweats. No new lumps, bumps, or lesions/rashes. No  black or bloody stools. No nausea or vomiting. No new focal bone pains. No new or unexpected weight loss. No other new or acute focal symptoms.  She denies any problems with Zometa. She denies any dental issues.  Labs done today were reviewed in detail.  MEDICAL HISTORY:  Past Medical History:  Diagnosis Date   Anemia    Glaucoma    HYPERTENSION 03/11/2007   Multiple myeloma ([place])    OSTEOPENIA 03/11/2007   Pre-diabetes    Thyroid cancer ([place])    Thyroid disease    Tubular adenoma of colon 07/2015    SURGICAL HISTORY: Past Surgical History:  Procedure Laterality Date   BONE MARROW BIOPSY     multiple   BREAST EXCISIONAL BIOPSY Right 2000   BREAST LUMPECTOMY  1990   benign   CATARACT EXTRACTION Bilateral 2018   DILATION AND CURETTAGE OF UTERUS     bleeding at menopause. No uterine cancer   IR IMAGING GUIDED PORT INSERTION  05/16/2021   IR RADIOLOGIST EVAL & MGMT  12/13/2018   THYROIDECTOMY N/A 08/02/2020   Procedure: TOTAL THYROIDECTOMY;  Surgeon: [name], MD;  Location: [facility];  Service: General;  Laterality: N/A;   TONSILLECTOMY     age 36   I have reviewed the social history and family history with the patient and they are unchanged from previous note.  ALLERGIES:  is allergic to ace inhibitors, benadryl [diphenhydramine], diamox [acetazolamide], sulfamethoxazole, lenalidomide, and penicillins.  MEDICATIONS:  Current Outpatient Medications  Medication Sig Dispense Refill   acyclovir (ZOVIRAX) 400 MG tablet Take 1 tablet (400 mg total) by mouth 2 (two) times daily. 60 tablet 3   ALPHAGAN P 0.1 % SOLN Place 1 drop into both eyes in the morning, at noon, and at bedtime.      amLODipine (NORVASC) 10 MG tablet Take 1 tablet (10 mg total) by mouth daily. 90 tablet 3   aspirin EC 81 MG tablet Take 81 mg by mouth daily.     Cholecalciferol (VITAMIN D3) 125 MCG (5000 UT) TABS Take 5,000 Units by mouth daily.     Co-Enzyme Q-10 100 MG CAPS Take 100 mg by mouth daily.      dexamethasone (DECADRON) 4 MG tablet Take 5 tablets (20 mg total) by mouth daily. Take the day after daratumumab. Take with breakfast 20 tablet 11   dorzolamide-timolol (COSOPT) 22.3-6.8 MG/ML ophthalmic solution Place 1 drop into both eyes 2 (two) times daily.   11   fentaNYL (DURAGESIC) 12 MCG/HR Place 1 patch onto the skin every 3 (three) days. 10 patch 0   levothyroxine (SYNTHROID) 88 MCG tablet Take 1 tablet (88 mcg total) by mouth daily before breakfast. 90 tablet 3   lidocaine-prilocaine (EMLA) cream Apply to affected area once 30 g 3   LUMIGAN 0.01 % SOLN 1 drop at bedtime. (Patient not taking: Reported on 07/08/2022)     magnesium chloride (SLOW-MAG) 64 MG TBEC SR tablet Take by mouth.     Multiple Vitamins-Minerals (MULTIVITAMIN WITH MINERALS) tablet Take 1 tablet by mouth daily. Centrum Silver 50 +     Netarsudil-Latanoprost (ROCKLATAN) 0.02-0.005 % SOLN Place 1 drop into both eyes daily.     Omega-3 1000 MG CAPS Take 1,000 mg by mouth daily.  ondansetron (ZOFRAN) 8 MG tablet Take 1 tablet (8 mg total) by mouth every 8 (eight) hours as needed for nausea or vomiting. 30 tablet 1   oxyCODONE-acetaminophen (PERCOCET) 5-325 MG tablet Take 1-2 tablets by mouth every 4 (four) hours as needed for moderate pain or severe pain. 90 tablet 0   polyethylene glycol (MIRALAX) packet Take 17 g by mouth daily. 30 each 1   pomalidomide (POMALYST) 2 MG capsule Take 1 capsule (2 mg total) by mouth daily. Take for 21 days on, 7 days off, repeat every 28 days 21 capsule 0   prochlorperazine (COMPAZINE) 10 MG tablet Take 1 tablet (10 mg total) by mouth every 6 (six) hours as needed for nausea or vomiting. 30 tablet 1   senna-docusate (SENOKOT-S) 8.6-50 MG tablet Take 2 tablets by mouth at bedtime. 60 tablet 0   Simethicone (GAS-X PO) Take 1 tablet by mouth as needed.     vitamin C (ASCORBIC ACID) 500 MG tablet Take 500 mg by mouth 2 (two) times a week.      No current facility-administered medications for  this visit.    PHYSICAL EXAMINATION:. .BP (!) 171/71 (BP Location: Left Arm, Patient Position: Sitting) Comment: nurse notified  Pulse 63   Temp 98 F (36.7 C) (Temporal)   Resp 15   Ht 5' (1.524 m)   Wt 106 lb 6.4 oz (48.3 kg)   SpO2 97%   BMI 20.78 kg/m   NAD GENERAL:alert, in no acute distress and comfortable SKIN: no acute rashes, no significant lesions EYES: conjunctiva are pink and non-injected, sclera anicteric NECK: supple, no JVD LYMPH:  no palpable lymphadenopathy in the cervical, axillary or inguinal regions LUNGS: clear to auscultation b/l with normal respiratory effort HEART: regular rate & rhythm ABDOMEN:  normoactive bowel sounds , non tender, not distended. Extremity: no pedal edema PSYCH: alert & oriented x 3 with fluent speech NEURO: no focal motor/sensory deficits  LABORATORY DATA:   .    Latest Ref Rng & Units 08/04/2022   10:02 AM 07/21/2022    8:33 AM 07/08/2022    9:18 AM  CBC  WBC 4.0 - 10.5 K/uL 2.7  5.9  2.6   Hemoglobin 12.0 - 15.0 g/dL 11.1  10.9  10.9   Hematocrit 36.0 - 46.0 % 32.7  32.8  32.2   Platelets 150 - 400 K/uL 269  215  266       Latest Ref Rng & Units 08/04/2022   10:02 AM 07/21/2022    8:33 AM 07/08/2022    9:18 AM  CMP  Glucose 70 - 99 mg/dL 96  108  106   BUN 8 - 23 mg/dL _0 Creatinine 0.44 - 1.00 mg/dL 0.65  0.60  0.60   Sodium 135 - 145 mmol/L 140  140  138   Potassium 3.5 - 5.1 mmol/L 4.2  4.1  4.3   Chloride 98 - 111 mmol/L 107  107  107   CO2 22 - 32 mmol/L _1 Calcium 8.9 - 10.3 mg/dL 8.8  8.9  8.6   Total Protein 6.5 - 8.1 g/dL 6.3  6.2  5.9   Total Bilirubin 0.3 - 1.2 mg/dL 0.3  0.4  0.4   Alkaline Phos 38 - 126 U/L _2 AST 15 - 41 U/L _3 ALT 0 - 44 U/L 12  12  11  RADIOGRAPHIC STUDIES: I have personally reviewed the radiological images as listed and agreed with the findings in the report. No results found.   ASSESSMENT & PLAN:  Megan Olson is a 81 y.o.  female who presents for a follow up for multiple myeloma.   1. 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  She achieved complete response with negative M protein and negative PET scan in December 2020. -She subsequently went on Ninlaro maintenance therapy -PET scan from May 08, 2021, which showed focal activity within the right femur, consistent with active multiple myeloma, no other hypermetabolic lesion or plasmacytoma. -05/06/2021 bone marrow biopsy, which showed 3% plasma cells. -Recommend Zometa every 2 months. -Last myeloma labs from 06/30/22 show her M protein is down to 0.6.  2. Mild anemia -We will continue to monitor with myeloma treatment and current medications -CBC shows WBC of 2.6 today 07/08/22. Chronic anemia, HGB at 10.9 stable. Platelets improved to 266. Date of labs: 07/08/22.  3.  Cancer related pain  -Currently well controlled -Continue fentanyl patch at 12 mcg/h and Percocet for break through pain.    PLAN Here for cycle 4 day 1 of daratumumab pomalidomide chemotherapy. Labs done today were reviewed with her in detail and are stable. M spike down from 1.4 to 0.6g/dl No significant new toxicities from her current plan of treatment.DPd Medication refills done as requested. -treatment orders reviewed and placed. Reduce Dexamethasone to  59m after next treatment. Recommended RSV vaccine, flu shot and new covid 19 vaccinations.    Follow-up Schedule next 2 cycles of treatment per integrated scheduling orders  The total time spent in the appointment was 30 minutes* .  All of the patient's questions were answered with apparent satisfaction. The patient knows to call the clinic with any problems, questions or concerns.   GSullivan LoneMD MS AAHIVMS SPl[facility]isCE[facility] LLCHematology/Oncology Physician CN[facility] .*Total Encounter Time as defined by the Centers for Medicare and Medicaid Services includes, in addition to the face-to-face time of a patient visit (documented in the note above) non-face-to-face time: obtaining and reviewing outside history, ordering and reviewing medications, tests or procedures, care coordination (communications with other health care professionals or caregivers) and documentation in the medical record.  I, GMelene Muller am acting as scribe for Dr. GSullivan Lone MD.  .I have reviewed the above documentation for accuracy and completeness, and I agree with the above. .Brunetta GeneraMD

## 2022-08-04 NOTE — Progress Notes (Signed)
Per Dr. Irene Limbo - okay to proceed with ANC of 1.4 today.

## 2022-08-05 ENCOUNTER — Encounter: Payer: Self-pay | Admitting: Hematology

## 2022-08-05 NOTE — Progress Notes (Signed)
Pt returned my call and we discussed the Megan Olson, she gave me her household income but unfortunately she's overqualified for the grant.  I informed her that her grant w/ the [employer] will cover her oral meds and infusion but at this time her ins is paying for her treatment at 100%.  She verbalized understanding.

## 2022-08-07 LAB — MULTIPLE MYELOMA PANEL, SERUM
Albumin SerPl Elph-Mcnc: 3.8 g/dL (ref 2.9–4.4)
Albumin/Glob SerPl: 1.6 (ref 0.7–1.7)
Alpha 1: 0.2 g/dL (ref 0.0–0.4)
Alpha2 Glob SerPl Elph-Mcnc: 0.7 g/dL (ref 0.4–1.0)
B-Globulin SerPl Elph-Mcnc: 1.2 g/dL (ref 0.7–1.3)
Gamma Glob SerPl Elph-Mcnc: 0.2 g/dL — ABNORMAL LOW (ref 0.4–1.8)
Globulin, Total: 2.4 g/dL (ref 2.2–3.9)
IgA: 17 mg/dL — ABNORMAL LOW (ref 64–422)
IgG (Immunoglobin G), Serum: 734 mg/dL (ref 586–1602)
IgM (Immunoglobulin M), Srm: 12 mg/dL — ABNORMAL LOW (ref 26–217)
M Protein SerPl Elph-Mcnc: 0.7 g/dL — ABNORMAL HIGH
Total Protein ELP: 6.2 g/dL (ref 6.0–8.5)

## 2022-08-10 ENCOUNTER — Encounter: Payer: Self-pay | Admitting: Hematology

## 2022-08-17 ENCOUNTER — Other Ambulatory Visit: Payer: Self-pay

## 2022-08-17 DIAGNOSIS — C9000 Multiple myeloma not having achieved remission: Secondary | ICD-10-CM

## 2022-08-17 MED ORDER — FENTANYL 12 MCG/HR TD PT72: 1.0000 | MEDICATED_PATCH | TRANSDERMAL | 0 refills | Status: DC

## 2022-08-17 MED FILL — Dexamethasone Sodium Phosphate Inj 100 MG/10ML: INTRAMUSCULAR | Qty: 2 | Status: AC

## 2022-08-18 ENCOUNTER — Inpatient Hospital Stay: Payer: Medicare HMO | Attending: Hematology

## 2022-08-18 ENCOUNTER — Other Ambulatory Visit: Payer: Self-pay

## 2022-08-18 ENCOUNTER — Inpatient Hospital Stay: Payer: Medicare HMO

## 2022-08-18 VITALS — BP 170/84 | HR 60 | Temp 97.7°F | Resp 16 | Ht 60.0 in | Wt 106.5 lb

## 2022-08-18 DIAGNOSIS — C9002 Multiple myeloma in relapse: Secondary | ICD-10-CM

## 2022-08-18 DIAGNOSIS — Z95828 Presence of other vascular implants and grafts: Secondary | ICD-10-CM

## 2022-08-18 DIAGNOSIS — Z5112 Encounter for antineoplastic immunotherapy: Secondary | ICD-10-CM | POA: Diagnosis present

## 2022-08-18 DIAGNOSIS — Z7189 Other specified counseling: Secondary | ICD-10-CM

## 2022-08-18 DIAGNOSIS — C9000 Multiple myeloma not having achieved remission: Secondary | ICD-10-CM

## 2022-08-18 LAB — CMP (CANCER CENTER ONLY)
ALT: 12 U/L (ref 0–44)
AST: 12 U/L — ABNORMAL LOW (ref 15–41)
Albumin: 4.2 g/dL (ref 3.5–5.0)
Alkaline Phosphatase: 26 U/L — ABNORMAL LOW (ref 38–126)
Anion gap: 5 (ref 5–15)
BUN: 15 mg/dL (ref 8–23)
CO2: 28 mmol/L (ref 22–32)
Calcium: 9 mg/dL (ref 8.9–10.3)
Chloride: 104 mmol/L (ref 98–111)
Creatinine: 0.66 mg/dL (ref 0.44–1.00)
GFR, Estimated: >60 mL/min (ref >60)
Glucose, Bld: 103 mg/dL — ABNORMAL HIGH (ref 70–99)
Potassium: 4.1 mmol/L (ref 3.5–5.1)
Sodium: 137 mmol/L (ref 135–145)
Total Bilirubin: 0.3 mg/dL (ref 0.3–1.2)
Total Protein: 6.5 g/dL (ref 6.5–8.1)

## 2022-08-18 LAB — CBC WITH DIFFERENTIAL (CANCER CENTER ONLY)
Abs Immature Granulocytes: 0.01 10*3/uL (ref 0.00–0.07)
Basophils Absolute: 0.2 10*3/uL — ABNORMAL HIGH (ref 0.0–0.1)
Basophils Relative: 3 %
Eosinophils Absolute: 0.3 10*3/uL (ref 0.0–0.5)
Eosinophils Relative: 6 %
HCT: 34.1 % — ABNORMAL LOW (ref 36.0–46.0)
Hemoglobin: 11.5 g/dL — ABNORMAL LOW (ref 12.0–15.0)
Immature Granulocytes: 0 %
Lymphocytes Relative: 7 %
Lymphs Abs: 0.4 10*3/uL — ABNORMAL LOW (ref 0.7–4.0)
MCH: 33.9 pg (ref 26.0–34.0)
MCHC: 33.7 g/dL (ref 30.0–36.0)
MCV: 100.6 fL — ABNORMAL HIGH (ref 80.0–100.0)
Monocytes Absolute: 0.8 10*3/uL (ref 0.1–1.0)
Monocytes Relative: 15 %
Neutro Abs: 3.6 10*3/uL (ref 1.7–7.7)
Neutrophils Relative %: 69 %
Platelet Count: 218 10*3/uL (ref 150–400)
RBC: 3.39 MIL/uL — ABNORMAL LOW (ref 3.87–5.11)
RDW: 13.5 % (ref 11.5–15.5)
WBC Count: 5.3 10*3/uL (ref 4.0–10.5)
nRBC: 0 % (ref 0.0–0.2)

## 2022-08-18 MED ORDER — ACETAMINOPHEN 325 MG PO TABS
650.0000 mg | ORAL_TABLET | Freq: Once | ORAL | Status: AC
  Administered 2022-08-18: 650 mg via ORAL
  Filled 2022-08-18: qty 2

## 2022-08-18 MED ORDER — FAMOTIDINE IN NACL 20-0.9 MG/50ML-% IV SOLN
20.0000 mg | Freq: Once | INTRAVENOUS | Status: AC
  Administered 2022-08-18: 20 mg via INTRAVENOUS
  Filled 2022-08-18: qty 50

## 2022-08-18 MED ORDER — SODIUM CHLORIDE 0.9 % IV SOLN: Freq: Once | INTRAVENOUS | Status: AC

## 2022-08-18 MED ORDER — HEPARIN SOD (PORK) LOCK FLUSH 100 UNIT/ML IV SOLN
500.0000 [IU] | Freq: Once | INTRAVENOUS | Status: AC | PRN
  Administered 2022-08-18: 500 [IU]

## 2022-08-18 MED ORDER — SODIUM CHLORIDE 0.9% FLUSH
10.0000 mL | Freq: Once | INTRAVENOUS | Status: AC
  Administered 2022-08-18: 10 mL

## 2022-08-18 MED ORDER — ZOLEDRONIC ACID 4 MG/100ML IV SOLN
4.0000 mg | Freq: Once | INTRAVENOUS | Status: AC
  Administered 2022-08-18: 4 mg via INTRAVENOUS
  Filled 2022-08-18: qty 100

## 2022-08-18 MED ORDER — SODIUM CHLORIDE 0.9 % IV SOLN
20.0000 mg | Freq: Once | INTRAVENOUS | Status: AC
  Administered 2022-08-18: 20 mg via INTRAVENOUS
  Filled 2022-08-18: qty 20

## 2022-08-18 MED ORDER — SODIUM CHLORIDE 0.9% FLUSH
10.0000 mL | INTRAVENOUS | Status: DC | PRN
  Administered 2022-08-18: 10 mL

## 2022-08-18 MED ORDER — HYDROXYZINE HCL 25 MG PO TABS
25.0000 mg | ORAL_TABLET | Freq: Once | ORAL | Status: AC
  Administered 2022-08-18: 25 mg via ORAL
  Filled 2022-08-18: qty 1

## 2022-08-18 MED ORDER — POMALIDOMIDE 2 MG PO CAPS: 2.0000 mg | ORAL_CAPSULE | Freq: Every day | ORAL | 0 refills | Status: DC

## 2022-08-18 MED ORDER — SODIUM CHLORIDE 0.9 % IV SOLN
16.0000 mg/kg | Freq: Once | INTRAVENOUS | Status: AC
  Administered 2022-08-18: 700 mg via INTRAVENOUS
  Filled 2022-08-18: qty 20

## 2022-08-24 ENCOUNTER — Other Ambulatory Visit: Payer: Self-pay

## 2022-08-24 DIAGNOSIS — C9002 Multiple myeloma in relapse: Secondary | ICD-10-CM

## 2022-08-24 MED ORDER — SENNOSIDES-DOCUSATE SODIUM 8.6-50 MG PO TABS: 2.0000 | ORAL_TABLET | Freq: Every day | ORAL | 0 refills | Status: DC

## 2022-08-31 MED FILL — Dexamethasone Sodium Phosphate Inj 100 MG/10ML: INTRAMUSCULAR | Qty: 2 | Status: AC

## 2022-09-01 ENCOUNTER — Other Ambulatory Visit: Payer: Self-pay

## 2022-09-01 ENCOUNTER — Inpatient Hospital Stay (HOSPITAL_BASED_OUTPATIENT_CLINIC_OR_DEPARTMENT_OTHER): Payer: Medicare HMO | Admitting: Hematology

## 2022-09-01 ENCOUNTER — Inpatient Hospital Stay: Payer: Medicare HMO

## 2022-09-01 VITALS — BP 178/74 | HR 65 | Resp 17

## 2022-09-01 DIAGNOSIS — Z95828 Presence of other vascular implants and grafts: Secondary | ICD-10-CM

## 2022-09-01 DIAGNOSIS — C9000 Multiple myeloma not having achieved remission: Secondary | ICD-10-CM

## 2022-09-01 DIAGNOSIS — C9002 Multiple myeloma in relapse: Secondary | ICD-10-CM

## 2022-09-01 DIAGNOSIS — Z7189 Other specified counseling: Secondary | ICD-10-CM | POA: Diagnosis not present

## 2022-09-01 DIAGNOSIS — Z5112 Encounter for antineoplastic immunotherapy: Secondary | ICD-10-CM | POA: Diagnosis not present

## 2022-09-01 LAB — CMP (CANCER CENTER ONLY)
ALT: 12 U/L (ref 0–44)
AST: 15 U/L (ref 15–41)
Albumin: 4.2 g/dL (ref 3.5–5.0)
Alkaline Phosphatase: 30 U/L — ABNORMAL LOW (ref 38–126)
Anion gap: 5 (ref 5–15)
BUN: 16 mg/dL (ref 8–23)
CO2: 28 mmol/L (ref 22–32)
Calcium: 9.4 mg/dL (ref 8.9–10.3)
Chloride: 106 mmol/L (ref 98–111)
Creatinine: 0.62 mg/dL (ref 0.44–1.00)
GFR, Estimated: >60 mL/min (ref >60)
Glucose, Bld: 108 mg/dL — ABNORMAL HIGH (ref 70–99)
Potassium: 4.1 mmol/L (ref 3.5–5.1)
Sodium: 139 mmol/L (ref 135–145)
Total Bilirubin: 0.3 mg/dL (ref 0.3–1.2)
Total Protein: 6.8 g/dL (ref 6.5–8.1)

## 2022-09-01 LAB — CBC WITH DIFFERENTIAL (CANCER CENTER ONLY)
Abs Immature Granulocytes: 0.01 10*3/uL (ref 0.00–0.07)
Basophils Absolute: 0.2 10*3/uL — ABNORMAL HIGH (ref 0.0–0.1)
Basophils Relative: 4 %
Eosinophils Absolute: 0.1 10*3/uL (ref 0.0–0.5)
Eosinophils Relative: 4 %
HCT: 34.8 % — ABNORMAL LOW (ref 36.0–46.0)
Hemoglobin: 11.4 g/dL — ABNORMAL LOW (ref 12.0–15.0)
Immature Granulocytes: 0 %
Lymphocytes Relative: 12 %
Lymphs Abs: 0.4 10*3/uL — ABNORMAL LOW (ref 0.7–4.0)
MCH: 32.7 pg (ref 26.0–34.0)
MCHC: 32.8 g/dL (ref 30.0–36.0)
MCV: 99.7 fL (ref 80.0–100.0)
Monocytes Absolute: 0.8 10*3/uL (ref 0.1–1.0)
Monocytes Relative: 22 %
Neutro Abs: 2 10*3/uL (ref 1.7–7.7)
Neutrophils Relative %: 58 %
Platelet Count: 276 10*3/uL (ref 150–400)
RBC: 3.49 MIL/uL — ABNORMAL LOW (ref 3.87–5.11)
RDW: 14.1 % (ref 11.5–15.5)
WBC Count: 3.4 10*3/uL — ABNORMAL LOW (ref 4.0–10.5)
nRBC: 0 % (ref 0.0–0.2)

## 2022-09-01 MED ORDER — SODIUM CHLORIDE 0.9% FLUSH
10.0000 mL | Freq: Once | INTRAVENOUS | Status: AC
  Administered 2022-09-01: 10 mL

## 2022-09-01 MED ORDER — SODIUM CHLORIDE 0.9 % IV SOLN
16.5000 mg/kg | Freq: Once | INTRAVENOUS | Status: AC
  Administered 2022-09-01: 800 mg via INTRAVENOUS
  Filled 2022-09-01: qty 40

## 2022-09-01 MED ORDER — SODIUM CHLORIDE 0.9 % IV SOLN
20.0000 mg | Freq: Once | INTRAVENOUS | Status: AC
  Administered 2022-09-01: 20 mg via INTRAVENOUS
  Filled 2022-09-01: qty 20

## 2022-09-01 MED ORDER — HYDROXYZINE HCL 25 MG PO TABS
25.0000 mg | ORAL_TABLET | Freq: Once | ORAL | Status: AC
  Administered 2022-09-01: 25 mg via ORAL
  Filled 2022-09-01: qty 1

## 2022-09-01 MED ORDER — FAMOTIDINE IN NACL 20-0.9 MG/50ML-% IV SOLN
20.0000 mg | Freq: Once | INTRAVENOUS | Status: AC
  Administered 2022-09-01: 20 mg via INTRAVENOUS
  Filled 2022-09-01: qty 50

## 2022-09-01 MED ORDER — SODIUM CHLORIDE 0.9% FLUSH
10.0000 mL | INTRAVENOUS | Status: DC | PRN
  Administered 2022-09-01: 10 mL

## 2022-09-01 MED ORDER — ACETAMINOPHEN 325 MG PO TABS
650.0000 mg | ORAL_TABLET | Freq: Once | ORAL | Status: AC
  Administered 2022-09-01: 650 mg via ORAL
  Filled 2022-09-01: qty 2

## 2022-09-01 MED ORDER — HEPARIN SOD (PORK) LOCK FLUSH 100 UNIT/ML IV SOLN
500.0000 [IU] | Freq: Once | INTRAVENOUS | Status: AC | PRN
  Administered 2022-09-01: 500 [IU]

## 2022-09-01 MED ORDER — SODIUM CHLORIDE 0.9 % IV SOLN: Freq: Once | INTRAVENOUS | Status: AC

## 2022-09-01 NOTE — Progress Notes (Signed)
Megan Olson   [phone]) [phone] [phone]) [phone]   Clinic Follow up Note   Patient Care Team: [name], MD as PCP - General (Family Medicine) [name], MD as Consulting Physician (Hematology) Bond, [name], MD as Referring Physician (Ophthalmology) [name], [name], MD as Consulting Physician (Hematology and Oncology) [name], MD as Consulting Physician (Internal Medicine) [name], MD as Consulting Physician (General Surgery) [name], [facility] as Pharmacist (Pharmacist)  Date of Service: 09/01/2022  CHIEF COMPLAINT:  Follow-up for continued evaluation and management of multiple myeloma   SUMMARY OF ONCOLOGIC HISTORY: Oncology History  Multiple myeloma not having achieved remission ([place])  12/12/2018 Initial Diagnosis   Multiple myeloma not having achieved remission ([place])   12/20/2018 - 10/04/2019 Chemotherapy   The patient had dexamethasone (DECADRON) tablet 20 mg, 20 mg (100 % of original dose 20 mg), Oral, Once, 14 of 14 cycles Dose modification: 20 mg (original dose 20 mg, Cycle 1), 10 mg (original dose 20 mg, Cycle 14) Administration: 20 mg (12/20/2018), 20 mg (12/27/2018), 20 mg (01/10/2019), 20 mg (01/17/2019), 20 mg (01/31/2019), 20 mg (02/07/2019), 20 mg (03/14/2019), 20 mg (03/21/2019), 20 mg (02/21/2019), 20 mg (02/28/2019), 20 mg (04/04/2019), 20 mg (04/11/2019), 20 mg (04/25/2019), 20 mg (05/02/2019), 20 mg (05/16/2019), 20 mg (05/23/2019), 20 mg (06/06/2019), 20 mg (06/13/2019), 20 mg (06/27/2019), 20 mg (07/04/2019), 20 mg (07/18/2019), 20 mg (07/25/2019), 20 mg (08/08/2019), 20 mg (08/15/2019), 20 mg (08/29/2019), 20 mg (09/05/2019), 10 mg (09/19/2019) lenalidomide (REVLIMID) 15 MG capsule, 1 of 1 cycle, Start date: 01/18/2019, End date: 02/21/2019 bortezomib SQ (VELCADE) chemo injection 2 mg, 1.3 mg/m2 = 2 mg, Subcutaneous,  Once, 14 of 14 cycles Administration: 2 mg (12/20/2018), 2 mg (12/27/2018), 2 mg (12/23/2018), 2 mg  (12/30/2018), 2 mg (01/10/2019), 2 mg (01/17/2019), 2 mg (01/13/2019), 2 mg (01/20/2019), 2 mg (01/31/2019), 2 mg (02/07/2019), 2 mg (02/03/2019), 2 mg (02/10/2019), 2 mg (03/14/2019), 2 mg (03/17/2019), 2 mg (03/21/2019), 2 mg (03/24/2019), 2 mg (02/21/2019), 2 mg (02/24/2019), 2 mg (02/28/2019), 2 mg (03/03/2019), 2 mg (04/04/2019), 2 mg (04/06/2019), 2 mg (04/11/2019), 2 mg (04/14/2019), 2 mg (04/25/2019), 2 mg (04/28/2019), 2 mg (05/02/2019), 2 mg (05/05/2019), 2 mg (05/16/2019), 2 mg (05/19/2019), 2 mg (05/23/2019), 2 mg (05/26/2019), 2 mg (06/06/2019), 2 mg (06/09/2019), 2 mg (06/13/2019), 2 mg (06/16/2019), 2 mg (06/27/2019), 2 mg (06/30/2019), 2 mg (07/04/2019), 2 mg (07/07/2019), 2 mg (07/18/2019), 2 mg (07/21/2019), 2 mg (07/25/2019), 2 mg (07/28/2019), 2 mg (08/08/2019), 2 mg (08/11/2019), 2 mg (08/15/2019), 2 mg (08/18/2019), 2 mg (08/29/2019), 2 mg (09/01/2019), 2 mg (09/05/2019), 2 mg (09/08/2019), 2 mg (09/19/2019), 2 mg (10/04/2019)  for chemotherapy treatment.    Multiple myeloma in relapse ([place])  04/16/2021 Initial Diagnosis   Multiple myeloma in relapse ([place])   04/30/2021 - 04/15/2022 Chemotherapy   Patient is on Treatment Plan : MYELOMA RELAPSED/REFRACTORY KCd q28d     05/12/2022 - 07/08/2022 Chemotherapy   Patient is on Treatment Plan : MYELOMA Daratumumab + Pomalidomide + Dexamethasone q28d x 7 cycles     05/22/2022 -  Chemotherapy   Patient is on Treatment Plan : MYELOMA Daratumumab IV + Pomalidomide + Dexamethasone q28d x 7 cycles       CURRENT THERAPY: Kyprolis and dexamethasone, started on 04/30/2021  INTERVAL HISTORY:  Megan Olson is a 81 y.o. female returns for continued evaluation and management of multiple myeloma. He is here to start cycle 5 of his treatment.   Patient was last  seen by me 08/04/2022 and was doing well overall without any new medical concerns.   Patient reports she has been doing well without any new medical concerns since our last visit. She does complain of right shoulder pain when she raises her  hand, but it is not severe. She complains of grade 1 diarrhea due to her therapy. She denies abdominal pain, back pain, abnormal bowl moment, chest pain, or leg swelling.   She occasionally uses oxycodone as needed for her pain.   She had questions regarding her medications, which were answered. She had questions regarding masks in public and I answered them.  She is interested in traveling to visit family members and had questions, which were answered.   She has received the influenza vaccine, COVID-19 Booster, and RSV vaccine.   MEDICAL HISTORY:  Past Medical History:  Diagnosis Date   Anemia    Glaucoma    HYPERTENSION 03/11/2007   Multiple myeloma ([place])    OSTEOPENIA 03/11/2007   Pre-diabetes    Thyroid cancer ([place])    Thyroid disease    Tubular adenoma of colon 07/2015    SURGICAL HISTORY: Past Surgical History:  Procedure Laterality Date   BONE MARROW BIOPSY     multiple   BREAST EXCISIONAL BIOPSY Right 2000   BREAST LUMPECTOMY  1990   benign   CATARACT EXTRACTION Bilateral 2018   DILATION AND CURETTAGE OF UTERUS     bleeding at menopause. No uterine cancer   IR IMAGING GUIDED PORT INSERTION  05/16/2021   IR RADIOLOGIST EVAL & MGMT  12/13/2018   THYROIDECTOMY N/A 08/02/2020   Procedure: TOTAL THYROIDECTOMY;  Surgeon: [name], MD;  Location: [facility];  Service: General;  Laterality: N/A;   TONSILLECTOMY     age 77   I have reviewed the social history and family history with the patient and they are unchanged from previous note.  ALLERGIES:  is allergic to ace inhibitors, benadryl [diphenhydramine], diamox [acetazolamide], sulfamethoxazole, lenalidomide, and penicillins.  MEDICATIONS:  Current Outpatient Medications  Medication Sig Dispense Refill   acyclovir (ZOVIRAX) 400 MG tablet Take 1 tablet (400 mg total) by mouth 2 (two) times daily. 60 tablet 3   ALPHAGAN P 0.1 % SOLN Place 1 drop into both eyes in the morning, at noon, and at bedtime.      amLODipine  (NORVASC) 10 MG tablet Take 1 tablet (10 mg total) by mouth daily. 90 tablet 3   aspirin EC 81 MG tablet Take 81 mg by mouth daily.     Cholecalciferol (VITAMIN D3) 125 MCG (5000 UT) TABS Take 5,000 Units by mouth daily.     Co-Enzyme Q-10 100 MG CAPS Take 100 mg by mouth daily.     dexamethasone (DECADRON) 4 MG tablet Take 5 tablets (20 mg total) by mouth daily. Take the day after daratumumab. Take with breakfast 20 tablet 11   dorzolamide-timolol (COSOPT) 22.3-6.8 MG/ML ophthalmic solution Place 1 drop into both eyes 2 (two) times daily.   11   fentaNYL (DURAGESIC) 12 MCG/HR Place 1 patch onto the skin every 3 (three) days. 10 patch 0   levothyroxine (SYNTHROID) 88 MCG tablet Take 1 tablet (88 mcg total) by mouth daily before breakfast. 90 tablet 3   lidocaine-prilocaine (EMLA) cream Apply to affected area once 30 g 3   LUMIGAN 0.01 % SOLN 1 drop at bedtime. (Patient not taking: Reported on 07/08/2022)     magnesium chloride (SLOW-MAG) 64 MG TBEC SR tablet Take by mouth.  Multiple Vitamins-Minerals (MULTIVITAMIN WITH MINERALS) tablet Take 1 tablet by mouth daily. Centrum Silver 50 +     Netarsudil-Latanoprost (ROCKLATAN) 0.02-0.005 % SOLN Place 1 drop into both eyes daily.     Omega-3 1000 MG CAPS Take 1,000 mg by mouth daily.      ondansetron (ZOFRAN) 8 MG tablet Take 1 tablet (8 mg total) by mouth every 8 (eight) hours as needed for nausea or vomiting. 30 tablet 1   oxyCODONE-acetaminophen (PERCOCET) 5-325 MG tablet Take 1-2 tablets by mouth every 4 (four) hours as needed for moderate pain or severe pain. 90 tablet 0   polyethylene glycol (MIRALAX) packet Take 17 g by mouth daily. 30 each 1   pomalidomide (POMALYST) 2 MG capsule Take 1 capsule (2 mg total) by mouth daily. Take for 21 days on, 7 days off, repeat every 28 days 21 capsule 0   prochlorperazine (COMPAZINE) 10 MG tablet Take 1 tablet (10 mg total) by mouth every 6 (six) hours as needed for nausea or vomiting. 30 tablet 1    senna-docusate (SENOKOT-S) 8.6-50 MG tablet Take 2 tablets by mouth at bedtime. 60 tablet 0   Simethicone (GAS-X PO) Take 1 tablet by mouth as needed.     vitamin C (ASCORBIC ACID) 500 MG tablet Take 500 mg by mouth 2 (two) times a week.      No current facility-administered medications for this visit.    PHYSICAL EXAMINATION:. .BP (!) 180/75 (BP Location: Left Arm, Patient Position: Sitting) Comment: nurse notified  Pulse 62   Temp 97.8 F (36.6 C) (Temporal)   Resp 16   Ht 5' (1.524 m)   Wt 107 lb 6.4 oz (48.7 kg)   SpO2 97%   BMI 20.98 kg/m   NAD GENERAL:alert, in no acute distress and comfortable SKIN: no acute rashes, no significant lesions EYES: conjunctiva are pink and non-injected, sclera anicteric NECK: supple, no JVD LYMPH:  no palpable lymphadenopathy in the cervical, axillary or inguinal regions LUNGS: clear to auscultation b/l with normal respiratory effort HEART: regular rate & rhythm ABDOMEN:  normoactive bowel sounds , non tender, not distended. Extremity: no pedal edema PSYCH: alert & oriented x 3 with fluent speech NEURO: no focal motor/sensory deficits  LABORATORY DATA:   .    Latest Ref Rng & Units 09/01/2022   11:21 AM 08/18/2022   11:30 AM 08/04/2022   10:02 AM  CBC  WBC 4.0 - 10.5 K/uL 3.4  5.3  2.7   Hemoglobin 12.0 - 15.0 g/dL 11.4  11.5  11.1   Hematocrit 36.0 - 46.0 % 34.8  34.1  32.7   Platelets 150 - 400 K/uL 276  218  269       Latest Ref Rng & Units 09/01/2022   11:21 AM 08/18/2022   11:30 AM 08/04/2022   10:02 AM  CMP  Glucose 70 - 99 mg/dL 108  103  96   BUN 8 - 23 mg/dL _0 Creatinine 0.44 - 1.00 mg/dL 0.62  0.66  0.65   Sodium 135 - 145 mmol/L 139  137  140   Potassium 3.5 - 5.1 mmol/L 4.1  4.1  4.2   Chloride 98 - 111 mmol/L 106  104  107   CO2 22 - 32 mmol/L _1 Calcium 8.9 - 10.3 mg/dL 9.4  9.0  8.8   Total Protein 6.5 - 8.1 g/dL 6.8  6.5  6.3   Total Bilirubin 0.3 -  1.2 mg/dL 0.3  0.3  0.3    Alkaline Phos 38 - 126 U/L _0 AST 15 - 41 U/L _1 ALT 0 - 44 U/L _2 RADIOGRAPHIC STUDIES: I have personally reviewed the radiological images as listed and agreed with the findings in the report. No results found.   ASSESSMENT & PLAN:  Megan Olson is a 81 y.o. female who presents for a follow up for multiple myeloma.   1. 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  She achieved complete response with negative M protein and negative PET scan in December 2020. -She subsequently went on Ninlaro maintenance therapy -PET scan from May 08, 2021, which showed focal activity within the right femur, consistent with active multiple myeloma, no other hypermetabolic lesion or plasmacytoma. -05/06/2021 bone marrow biopsy, which showed 3% plasma cells. -Recommend Zometa every 2 months. -Last myeloma labs from 06/30/22 show her M protein is down to 0.6.  2. Mild anemia -We will continue to monitor with myeloma treatment and current medications -CBC shows WBC of 2.6 today 07/08/22. Chronic anemia, HGB at 10.9 stable. Platelets improved to 266. Date of labs: 07/08/22.  3.  Cancer related pain  -Currently well controlled -Continue fentanyl patch at 12 mcg/h and Percocet for break through pain.    PLAN: -Here for cycle 5 day 1 of daratumumab pomalidomide chemotherapy. -Labs done today were reviewed with her in detail and are stable. M spike down from 0.7 to 0.4g/dl. -No significant new toxicities from her current plan of treatment. Reduce post treatment Dexamethasone to 19m after next treatment. -Answered all of patient's questions.  -If M spike is stable after cycle 6, cycle 7 and onward will be once a month.  Follow-up: Schedule next 2 cycles of treatment per integrated scheduling  orders  The total time spent in the appointment was 30 minutes* .  All of the patient's questions were answered with apparent satisfaction. The patient knows to call the clinic with any problems, questions or concerns.   GSullivan LoneMD MS AAHIVMS SSpecial[facility]portCBapt[facility] LLCHematology/Oncology Physician [facility] .*Total Encounter Time as defined by the Centers for Medicare and Medicaid Services includes, in addition to the face-to-face time of a patient visit (documented in the note above) non-face-to-face time: obtaining and reviewing outside history, ordering and reviewing medications, tests or procedures, care coordination (communications with other health care professionals or caregivers) and documentation in the medical record.   I, PCleda Mccreedy am acting as a sEducation administratorfor GSullivan Lone MD.  .I have reviewed the above documentation for accuracy and completeness, and I agree with the above. .Brunetta GeneraMD

## 2022-09-01 NOTE — Progress Notes (Signed)
Patient seen by MD today  Vitals are within treatment parameters.  Labs reviewed: and are within treatment parameters.  Per physician team, patient is ready for treatment and there are NO modifications to the treatment plan.  

## 2022-09-03 LAB — MULTIPLE MYELOMA PANEL, SERUM
Albumin SerPl Elph-Mcnc: 3.6 g/dL (ref 2.9–4.4)
Albumin/Glob SerPl: 1.6 (ref 0.7–1.7)
Alpha 1: 0.3 g/dL (ref 0.0–0.4)
Alpha2 Glob SerPl Elph-Mcnc: 0.8 g/dL (ref 0.4–1.0)
B-Globulin SerPl Elph-Mcnc: 1.1 g/dL (ref 0.7–1.3)
Gamma Glob SerPl Elph-Mcnc: 0.2 g/dL — ABNORMAL LOW (ref 0.4–1.8)
Globulin, Total: 2.4 g/dL (ref 2.2–3.9)
IgA: 22 mg/dL — ABNORMAL LOW (ref 64–422)
IgG (Immunoglobin G), Serum: 665 mg/dL (ref 586–1602)
IgM (Immunoglobulin M), Srm: 9 mg/dL — ABNORMAL LOW (ref 26–217)
M Protein SerPl Elph-Mcnc: 0.4 g/dL — ABNORMAL HIGH
Total Protein ELP: 6 g/dL (ref 6.0–8.5)

## 2022-09-07 ENCOUNTER — Encounter: Payer: Self-pay | Admitting: Hematology

## 2022-09-11 ENCOUNTER — Other Ambulatory Visit: Payer: Self-pay

## 2022-09-11 DIAGNOSIS — C9002 Multiple myeloma in relapse: Secondary | ICD-10-CM

## 2022-09-11 DIAGNOSIS — Z7189 Other specified counseling: Secondary | ICD-10-CM

## 2022-09-11 MED ORDER — POMALIDOMIDE 2 MG PO CAPS: 2.0000 mg | ORAL_CAPSULE | Freq: Every day | ORAL | 0 refills | Status: DC

## 2022-09-14 ENCOUNTER — Telehealth: Payer: Self-pay | Admitting: Pharmacist

## 2022-09-14 MED FILL — Dexamethasone Sodium Phosphate Inj 100 MG/10ML: INTRAMUSCULAR | Qty: 2 | Status: AC

## 2022-09-14 NOTE — Progress Notes (Signed)
Chronic Care Management Pharmacy Assistant   Name: Megan Olson  MRN: [id number] DOB: [birth year]   Reason for Encounter: Hypertension Adherence Call    Recent office visits:  None  Recent consult visits:  09/01/2022 OV (Oncology) Toma Copier, MD; Reduce post treatment Dexamethasone to '4mg'$  after next treatment.   08/04/2022 OV (Oncology) Toma Copier, MD; Reduce Dexamethasone to '8mg'$  after next treatment   07/25/2022 OV (Ophthalmology) Edilia Bo, [name], MD;  Alphagan P x3 OU  Dorzolamide-Timolol x2 OU Continue Rocklatan x1 OU qhs Recommend ATs prn  Reviewed proper drop instillation technique   07/08/2022 OV (Oncology) Toma Copier, MD; no medication changes  Hospital visits:  None in previous 6 months  Medications: Outpatient Encounter Medications as of 09/14/2022  Medication Sig Note   acyclovir (ZOVIRAX) 400 MG tablet Take 1 tablet (400 mg total) by mouth 2 (two) times daily.    ALPHAGAN P 0.1 % SOLN Place 1 drop into both eyes in the morning, at noon, and at bedtime.     amLODipine (NORVASC) 10 MG tablet Take 1 tablet (10 mg total) by mouth daily.    aspirin EC 81 MG tablet Take 81 mg by mouth daily.    Cholecalciferol (VITAMIN D3) 125 MCG (5000 UT) TABS Take 5,000 Units by mouth daily.    Co-Enzyme Q-10 100 MG CAPS Take 100 mg by mouth daily.    dexamethasone (DECADRON) 4 MG tablet Take 5 tablets (20 mg total) by mouth daily. Take the day after daratumumab. Take with breakfast 06/16/2022: Decadron 16 mg for now and provider will reevaluate at next visit.   dorzolamide-timolol (COSOPT) 22.3-6.8 MG/ML ophthalmic solution Place 1 drop into both eyes 2 (two) times daily.     fentaNYL (DURAGESIC) 12 MCG/HR Place 1 patch onto the skin every 3 (three) days.    levothyroxine (SYNTHROID) 88 MCG tablet Take 1 tablet (88 mcg total) by mouth daily before breakfast.    lidocaine-prilocaine (EMLA) cream Apply to affected area once    LUMIGAN 0.01 % SOLN 1  drop at bedtime. (Patient not taking: Reported on 07/08/2022)    magnesium chloride (SLOW-MAG) 64 MG TBEC SR tablet Take by mouth.    Multiple Vitamins-Minerals (MULTIVITAMIN WITH MINERALS) tablet Take 1 tablet by mouth daily. Centrum Silver 50 +    Netarsudil-Latanoprost (ROCKLATAN) 0.02-0.005 % SOLN Place 1 drop into both eyes daily.    Omega-3 1000 MG CAPS Take 1,000 mg by mouth daily.     ondansetron (ZOFRAN) 8 MG tablet Take 1 tablet (8 mg total) by mouth every 8 (eight) hours as needed for nausea or vomiting.    oxyCODONE-acetaminophen (PERCOCET) 5-325 MG tablet Take 1-2 tablets by mouth every 4 (four) hours as needed for moderate pain or severe pain.    polyethylene glycol (MIRALAX) packet Take 17 g by mouth daily.    pomalidomide (POMALYST) 2 MG capsule Take 1 capsule (2 mg total) by mouth daily. Take for 21 days on, 7 days off, repeat every 28 days    prochlorperazine (COMPAZINE) 10 MG tablet Take 1 tablet (10 mg total) by mouth every 6 (six) hours as needed for nausea or vomiting.    senna-docusate (SENOKOT-S) 8.6-50 MG tablet Take 2 tablets by mouth at bedtime.    Simethicone (GAS-X PO) Take 1 tablet by mouth as needed.    vitamin C (ASCORBIC ACID) 500 MG tablet Take 500 mg by mouth 2 (two) times a week.  10/17/2019: Taking every other day   No  facility-administered encounter medications on file as of 09/14/2022.   Reviewed chart prior to disease state call. Spoke with patient regarding BP  Recent Office Vitals: BP Readings from Last 3 Encounters:  09/01/22 (!) 178/74  09/01/22 (!) 180/75  08/18/22 (!) 170/84   Pulse Readings from Last 3 Encounters:  09/01/22 65  09/01/22 62  08/18/22 60    Wt Readings from Last 3 Encounters:  09/01/22 107 lb 6.4 oz (48.7 kg)  08/18/22 106 lb 8 oz (48.3 kg)  08/04/22 106 lb 6.4 oz (48.3 kg)     Kidney Function Lab Results  Component Value Date/Time   CREATININE 0.62 09/01/2022 11:21 AM   CREATININE 0.66 08/18/2022 11:30 AM   GFR 83.73  11/28/2018 03:48 PM   GFRNONAA >60 09/01/2022 11:21 AM   GFRAA >60 06/13/2020 11:12 AM       Latest Ref Rng & Units 09/01/2022   11:21 AM 08/18/2022   11:30 AM 08/04/2022   10:02 AM  BMP  Glucose 70 - 99 mg/dL 108  103  96   BUN 8 - 23 mg/dL '16  15  16   '$ Creatinine 0.44 - 1.00 mg/dL 0.62  0.66  0.65   Sodium 135 - 145 mmol/L 139  137  140   Potassium 3.5 - 5.1 mmol/L 4.1  4.1  4.2   Chloride 98 - 111 mmol/L 106  104  107   CO2 22 - 32 mmol/L '28  28  27   '$ Calcium 8.9 - 10.3 mg/dL 9.4  9.0  8.8     Current antihypertensive regimen:  Amlodipine 10 mg daily  How often are you checking your Blood Pressure? 1-2x per week  Current home BP readings: 135/79, 126/77, 121/77, 136/82  What recent interventions/DTPs have been made by any provider to improve Blood Pressure control since last CPP Visit: No recent interventions or DTPs.  Any recent hospitalizations or ED visits since last visit with CPP? No  What diet changes have been made to improve Blood Pressure Control?  Patient states she is trying to gain weight without eating the wrong things.  What exercise is being done to improve your Blood Pressure Control?  Patient states she does a lot of walking with her husband.  Adherence Review: Is the patient currently on ACE/ARB medication? No Does the patient have >5 day gap between last estimated fill dates? No   Care Gaps: Medicare Annual Wellness: Due soon 10/28/2022 Hemoglobin A1C: 5.0% 01/28/2022 Dexa Scan: Completed  Future Appointments  Date Time Provider [place]  09/15/2022  8:30 AM [facility] [place] FLUSH [facility] None  09/15/2022  9:30 AM [facility] INFUSION [facility] None  09/29/2022  8:30 AM [facility] [place] FLUSH [facility] None  09/29/2022  9:30 AM [facility] INFUSION [facility] None  09/29/2022  9:30 AM [name], MD [facility] None  10/13/2022  8:30 AM [facility] [place] [facility] None  10/13/2022  9:30 AM [facility] INFUSION [facility]  None  10/27/2022  9:00 AM [facility] [place] FLUSH [facility] None  10/27/2022  9:30 AM [name], MD [facility] None  10/27/2022 10:30 AM [facility] INFUSION [facility] None  11/12/2022  8:45 AM [facility] HEALTH COACH [facility]  12/07/2022  3:45 PM [facility] CCM PHARMACIST [facility]  01/26/2023  9:00 AM [name], MD [facility] None  02/08/2023  9:00 AM [name], MD [facility]   Star Rating Drugs: None  April D Calhoun, [place] Pharmacist Assistant [phone]

## 2022-09-15 ENCOUNTER — Inpatient Hospital Stay: Payer: Medicare HMO

## 2022-09-15 ENCOUNTER — Inpatient Hospital Stay: Payer: Medicare HMO | Attending: Hematology

## 2022-09-15 VITALS — BP 164/78 | HR 60 | Temp 98.6°F | Resp 18 | Wt 106.2 lb

## 2022-09-15 DIAGNOSIS — C9000 Multiple myeloma not having achieved remission: Secondary | ICD-10-CM

## 2022-09-15 DIAGNOSIS — Z5112 Encounter for antineoplastic immunotherapy: Secondary | ICD-10-CM | POA: Insufficient documentation

## 2022-09-15 DIAGNOSIS — Z95828 Presence of other vascular implants and grafts: Secondary | ICD-10-CM

## 2022-09-15 DIAGNOSIS — C9002 Multiple myeloma in relapse: Secondary | ICD-10-CM

## 2022-09-15 DIAGNOSIS — Z7189 Other specified counseling: Secondary | ICD-10-CM

## 2022-09-15 LAB — CBC WITH DIFFERENTIAL (CANCER CENTER ONLY)
Abs Immature Granulocytes: 0.01 10*3/uL (ref 0.00–0.07)
Basophils Absolute: 0.2 10*3/uL — ABNORMAL HIGH (ref 0.0–0.1)
Basophils Relative: 3 %
Eosinophils Absolute: 0.2 10*3/uL (ref 0.0–0.5)
Eosinophils Relative: 4 %
HCT: 34.2 % — ABNORMAL LOW (ref 36.0–46.0)
Hemoglobin: 11.4 g/dL — ABNORMAL LOW (ref 12.0–15.0)
Immature Granulocytes: 0 %
Lymphocytes Relative: 5 %
Lymphs Abs: 0.3 10*3/uL — ABNORMAL LOW (ref 0.7–4.0)
MCH: 32.9 pg (ref 26.0–34.0)
MCHC: 33.3 g/dL (ref 30.0–36.0)
MCV: 98.6 fL (ref 80.0–100.0)
Monocytes Absolute: 0.8 10*3/uL (ref 0.1–1.0)
Monocytes Relative: 13 %
Neutro Abs: 4.5 10*3/uL (ref 1.7–7.7)
Neutrophils Relative %: 75 %
Platelet Count: 212 10*3/uL (ref 150–400)
RBC: 3.47 MIL/uL — ABNORMAL LOW (ref 3.87–5.11)
RDW: 14.3 % (ref 11.5–15.5)
WBC Count: 5.9 10*3/uL (ref 4.0–10.5)
nRBC: 0 % (ref 0.0–0.2)

## 2022-09-15 LAB — CMP (CANCER CENTER ONLY)
ALT: 12 U/L (ref 0–44)
AST: 12 U/L — ABNORMAL LOW (ref 15–41)
Albumin: 3.9 g/dL (ref 3.5–5.0)
Alkaline Phosphatase: 28 U/L — ABNORMAL LOW (ref 38–126)
Anion gap: 4 — ABNORMAL LOW (ref 5–15)
BUN: 14 mg/dL (ref 8–23)
CO2: 29 mmol/L (ref 22–32)
Calcium: 9.3 mg/dL (ref 8.9–10.3)
Chloride: 106 mmol/L (ref 98–111)
Creatinine: 0.57 mg/dL (ref 0.44–1.00)
GFR, Estimated: >60 mL/min (ref >60)
Glucose, Bld: 106 mg/dL — ABNORMAL HIGH (ref 70–99)
Potassium: 4.3 mmol/L (ref 3.5–5.1)
Sodium: 139 mmol/L (ref 135–145)
Total Bilirubin: 0.4 mg/dL (ref 0.3–1.2)
Total Protein: 5.8 g/dL — ABNORMAL LOW (ref 6.5–8.1)

## 2022-09-15 MED ORDER — SODIUM CHLORIDE 0.9 % IV SOLN: Freq: Once | INTRAVENOUS | Status: AC

## 2022-09-15 MED ORDER — SODIUM CHLORIDE 0.9% FLUSH
10.0000 mL | INTRAVENOUS | Status: DC | PRN
  Administered 2022-09-15: 10 mL

## 2022-09-15 MED ORDER — FAMOTIDINE IN NACL 20-0.9 MG/50ML-% IV SOLN
20.0000 mg | Freq: Once | INTRAVENOUS | Status: AC
  Administered 2022-09-15: 20 mg via INTRAVENOUS
  Filled 2022-09-15: qty 50

## 2022-09-15 MED ORDER — SODIUM CHLORIDE 0.9 % IV SOLN
20.0000 mg | Freq: Once | INTRAVENOUS | Status: AC
  Administered 2022-09-15: 20 mg via INTRAVENOUS
  Filled 2022-09-15: qty 20

## 2022-09-15 MED ORDER — SODIUM CHLORIDE 0.9 % IV SOLN
16.0000 mg/kg | Freq: Once | INTRAVENOUS | Status: AC
  Administered 2022-09-15: 700 mg via INTRAVENOUS
  Filled 2022-09-15: qty 20

## 2022-09-15 MED ORDER — HEPARIN SOD (PORK) LOCK FLUSH 100 UNIT/ML IV SOLN
500.0000 [IU] | Freq: Once | INTRAVENOUS | Status: AC | PRN
  Administered 2022-09-15: 500 [IU]

## 2022-09-15 MED ORDER — HYDROXYZINE HCL 25 MG PO TABS
25.0000 mg | ORAL_TABLET | Freq: Once | ORAL | Status: AC
  Administered 2022-09-15: 25 mg via ORAL
  Filled 2022-09-15: qty 1

## 2022-09-15 MED ORDER — SODIUM CHLORIDE 0.9% FLUSH
10.0000 mL | Freq: Once | INTRAVENOUS | Status: AC
  Administered 2022-09-15: 10 mL

## 2022-09-15 MED ORDER — ACETAMINOPHEN 325 MG PO TABS
650.0000 mg | ORAL_TABLET | Freq: Once | ORAL | Status: AC
  Administered 2022-09-15: 650 mg via ORAL
  Filled 2022-09-15: qty 2

## 2022-09-15 NOTE — Patient Instructions (Signed)
Megan Olson ONCOLOGY  Discharge Instructions: Thank you for choosing [facility] to provide your oncology and hematology care.   If you have a lab appointment with the [facility], please go directly to the [facility] and check in at the registration area.   Wear comfortable clothing and clothing appropriate for easy access to any Portacath or PICC line.   We strive to give you quality time with your provider. You may need to reschedule your appointment if you arrive late (15 or more minutes).  Arriving late affects you and other patients whose appointments are after yours.  Also, if you miss three or more appointments without notifying the office, you may be dismissed from the clinic at the provider's discretion.      For prescription refill requests, have your pharmacy contact our office and allow 72 hours for refills to be completed.    Today you received the following chemotherapy and/or immunotherapy agents: daratumumab     To help prevent nausea and vomiting after your treatment, we encourage you to take your nausea medication as directed.  BELOW ARE SYMPTOMS THAT SHOULD BE REPORTED IMMEDIATELY: *FEVER GREATER THAN 100.4 F (38 C) OR HIGHER *CHILLS OR SWEATING *NAUSEA AND VOMITING THAT IS NOT CONTROLLED WITH YOUR NAUSEA MEDICATION *UNUSUAL SHORTNESS OF BREATH *UNUSUAL BRUISING OR BLEEDING *URINARY PROBLEMS (pain or burning when urinating, or frequent urination) *BOWEL PROBLEMS (unusual diarrhea, constipation, pain near the anus) TENDERNESS IN MOUTH AND THROAT WITH OR WITHOUT PRESENCE OF ULCERS (sore throat, sores in mouth, or a toothache) UNUSUAL RASH, SWELLING OR PAIN  UNUSUAL VAGINAL DISCHARGE OR ITCHING   Items with * indicate a potential emergency and should be followed up as soon as possible or go to the Emergency Department if any problems should occur.  Please show the CHEMOTHERAPY ALERT CARD or IMMUNOTHERAPY ALERT CARD at check-in to  the Emergency Department and triage nurse.  Should you have questions after your visit or need to cancel or reschedule your appointment, please contact Portal  Dept: [phone]  and follow the prompts.  Office hours are 8:00 a.m. to 4:30 p.m. Monday - Friday. Please note that voicemails left after 4:00 p.m. may not be returned until the following business day.  We are closed weekends and major holidays. You have access to a nurse at all times for urgent questions. Please call the main number to the clinic Dept: [phone] and follow the prompts.   For any non-urgent questions, you may also contact your provider using MyChart. We now offer e-Visits for anyone 44 and older to request care online for non-urgent symptoms. For details visit [email or web address].   Also download the MyChart app! Go to the app store, search "MyChart", open the app, select [facility], and log in with your MyChart username and password.  Masks are optional in the cancer centers. If you would like for your care team to wear a mask while they are taking care of you, please let them know. You may have one support person who is at least 81 years old accompany you for your appointments.

## 2022-09-17 ENCOUNTER — Other Ambulatory Visit: Payer: Self-pay

## 2022-09-17 DIAGNOSIS — C9000 Multiple myeloma not having achieved remission: Secondary | ICD-10-CM

## 2022-09-17 MED ORDER — FENTANYL 12 MCG/HR TD PT72: 1.0000 | MEDICATED_PATCH | TRANSDERMAL | 0 refills | Status: DC

## 2022-09-25 MED FILL — Dexamethasone Sodium Phosphate Inj 100 MG/10ML: INTRAMUSCULAR | Qty: 2 | Status: AC

## 2022-09-29 ENCOUNTER — Inpatient Hospital Stay (HOSPITAL_BASED_OUTPATIENT_CLINIC_OR_DEPARTMENT_OTHER): Payer: Medicare HMO | Admitting: Hematology

## 2022-09-29 ENCOUNTER — Inpatient Hospital Stay: Payer: Medicare HMO

## 2022-09-29 ENCOUNTER — Other Ambulatory Visit: Payer: Self-pay

## 2022-09-29 VITALS — BP 145/79 | HR 65 | Temp 98.7°F | Resp 16 | Wt 107.2 lb

## 2022-09-29 DIAGNOSIS — Z95828 Presence of other vascular implants and grafts: Secondary | ICD-10-CM

## 2022-09-29 DIAGNOSIS — Z7189 Other specified counseling: Secondary | ICD-10-CM

## 2022-09-29 DIAGNOSIS — C9002 Multiple myeloma in relapse: Secondary | ICD-10-CM

## 2022-09-29 DIAGNOSIS — C9000 Multiple myeloma not having achieved remission: Secondary | ICD-10-CM

## 2022-09-29 DIAGNOSIS — Z5112 Encounter for antineoplastic immunotherapy: Secondary | ICD-10-CM | POA: Diagnosis not present

## 2022-09-29 LAB — CMP (CANCER CENTER ONLY)
ALT: 13 U/L (ref 0–44)
AST: 13 U/L — ABNORMAL LOW (ref 15–41)
Albumin: 4 g/dL (ref 3.5–5.0)
Alkaline Phosphatase: 24 U/L — ABNORMAL LOW (ref 38–126)
Anion gap: 5 (ref 5–15)
BUN: 17 mg/dL (ref 8–23)
CO2: 28 mmol/L (ref 22–32)
Calcium: 9.3 mg/dL (ref 8.9–10.3)
Chloride: 107 mmol/L (ref 98–111)
Creatinine: 0.65 mg/dL (ref 0.44–1.00)
GFR, Estimated: >60 mL/min (ref >60)
Glucose, Bld: 114 mg/dL — ABNORMAL HIGH (ref 70–99)
Potassium: 4.2 mmol/L (ref 3.5–5.1)
Sodium: 140 mmol/L (ref 135–145)
Total Bilirubin: 0.4 mg/dL (ref 0.3–1.2)
Total Protein: 6.2 g/dL — ABNORMAL LOW (ref 6.5–8.1)

## 2022-09-29 LAB — CBC WITH DIFFERENTIAL (CANCER CENTER ONLY)
Abs Immature Granulocytes: 0 10*3/uL (ref 0.00–0.07)
Basophils Absolute: 0.2 10*3/uL — ABNORMAL HIGH (ref 0.0–0.1)
Basophils Relative: 5 %
Eosinophils Absolute: 0.1 10*3/uL (ref 0.0–0.5)
Eosinophils Relative: 3 %
HCT: 32.9 % — ABNORMAL LOW (ref 36.0–46.0)
Hemoglobin: 11.1 g/dL — ABNORMAL LOW (ref 12.0–15.0)
Immature Granulocytes: 0 %
Lymphocytes Relative: 12 %
Lymphs Abs: 0.4 10*3/uL — ABNORMAL LOW (ref 0.7–4.0)
MCH: 32.9 pg (ref 26.0–34.0)
MCHC: 33.7 g/dL (ref 30.0–36.0)
MCV: 97.6 fL (ref 80.0–100.0)
Monocytes Absolute: 0.8 10*3/uL (ref 0.1–1.0)
Monocytes Relative: 23 %
Neutro Abs: 2 10*3/uL (ref 1.7–7.7)
Neutrophils Relative %: 57 %
Platelet Count: 264 10*3/uL (ref 150–400)
RBC: 3.37 MIL/uL — ABNORMAL LOW (ref 3.87–5.11)
RDW: 14.7 % (ref 11.5–15.5)
WBC Count: 3.5 10*3/uL — ABNORMAL LOW (ref 4.0–10.5)
nRBC: 0 % (ref 0.0–0.2)

## 2022-09-29 MED ORDER — HEPARIN SOD (PORK) LOCK FLUSH 100 UNIT/ML IV SOLN
500.0000 [IU] | Freq: Once | INTRAVENOUS | Status: AC | PRN
  Administered 2022-09-29: 500 [IU]

## 2022-09-29 MED ORDER — SODIUM CHLORIDE 0.9% FLUSH
10.0000 mL | INTRAVENOUS | Status: DC | PRN
  Administered 2022-09-29: 10 mL

## 2022-09-29 MED ORDER — ACETAMINOPHEN 325 MG PO TABS
650.0000 mg | ORAL_TABLET | Freq: Once | ORAL | Status: AC
  Administered 2022-09-29: 650 mg via ORAL
  Filled 2022-09-29: qty 2

## 2022-09-29 MED ORDER — HYDROXYZINE HCL 25 MG PO TABS
25.0000 mg | ORAL_TABLET | Freq: Once | ORAL | Status: AC
  Administered 2022-09-29: 25 mg via ORAL
  Filled 2022-09-29: qty 1

## 2022-09-29 MED ORDER — SODIUM CHLORIDE 0.9 % IV SOLN
20.0000 mg | Freq: Once | INTRAVENOUS | Status: AC
  Administered 2022-09-29: 20 mg via INTRAVENOUS
  Filled 2022-09-29: qty 20

## 2022-09-29 MED ORDER — SODIUM CHLORIDE 0.9% FLUSH
10.0000 mL | Freq: Once | INTRAVENOUS | Status: AC
  Administered 2022-09-29: 10 mL

## 2022-09-29 MED ORDER — FAMOTIDINE IN NACL 20-0.9 MG/50ML-% IV SOLN
20.0000 mg | Freq: Once | INTRAVENOUS | Status: AC
  Administered 2022-09-29: 20 mg via INTRAVENOUS
  Filled 2022-09-29: qty 50

## 2022-09-29 MED ORDER — SODIUM CHLORIDE 0.9 % IV SOLN
16.0000 mg/kg | Freq: Once | INTRAVENOUS | Status: AC
  Administered 2022-09-29: 700 mg via INTRAVENOUS
  Filled 2022-09-29: qty 20

## 2022-09-29 MED ORDER — SODIUM CHLORIDE 0.9 % IV SOLN: Freq: Once | INTRAVENOUS | Status: AC

## 2022-09-29 NOTE — Progress Notes (Signed)
Calculated dose today is 11% over previous dose. Per Dr. Irene Limbo, keep dose at previous dose ('700mg'$ )  Laray Anger, PharmD PGY-2 Pharmacy Resident Hematology/Oncology [phone]  09/29/2022 11:00 AM

## 2022-10-05 ENCOUNTER — Encounter: Payer: Self-pay | Admitting: Hematology

## 2022-10-05 NOTE — Progress Notes (Signed)
[place]   [phone] [phone]   Clinic Follow up Note   Patient Care Team: [name], MD as PCP - General (Family Medicine) [name], MD as Consulting Physician (Hematology) Bond, [name], MD as Referring Physician (Ophthalmology) [name], [name], MD as Consulting Physician (Hematology and Oncology) [name], MD as Consulting Physician (Internal Medicine) [name], MD as Consulting Physician (General Surgery) [name], [facility] as Pharmacist (Pharmacist)  Date of Service: 10/05/2022  CHIEF COMPLAINT:  Follow-up for continuation and management of multiple myeloma   SUMMARY OF ONCOLOGIC HISTORY: Oncology History  Multiple myeloma not having achieved remission ([place])  12/12/2018 Initial Diagnosis   Multiple myeloma not having achieved remission ([place])   12/20/2018 - 10/04/2019 Chemotherapy   The patient had dexamethasone (DECADRON) tablet 20 mg, 20 mg (100 % of original dose 20 mg), Oral, Once, 14 of 14 cycles Dose modification: 20 mg (original dose 20 mg, Cycle 1), 10 mg (original dose 20 mg, Cycle 14) Administration: 20 mg (12/20/2018), 20 mg (12/27/2018), 20 mg (01/10/2019), 20 mg (01/17/2019), 20 mg (01/31/2019), 20 mg (02/07/2019), 20 mg (03/14/2019), 20 mg (03/21/2019), 20 mg (02/21/2019), 20 mg (02/28/2019), 20 mg (04/04/2019), 20 mg (04/11/2019), 20 mg (04/25/2019), 20 mg (05/02/2019), 20 mg (05/16/2019), 20 mg (05/23/2019), 20 mg (06/06/2019), 20 mg (06/13/2019), 20 mg (06/27/2019), 20 mg (07/04/2019), 20 mg (07/18/2019), 20 mg (07/25/2019), 20 mg (08/08/2019), 20 mg (08/15/2019), 20 mg (08/29/2019), 20 mg (09/05/2019), 10 mg (09/19/2019) lenalidomide (REVLIMID) 15 MG capsule, 1 of 1 cycle, Start date: 01/18/2019, End date: 02/21/2019 bortezomib SQ (VELCADE) chemo injection 2 mg, 1.3 mg/m2 = 2 mg, Subcutaneous,  Once, 14 of 14 cycles Administration: 2 mg (12/20/2018), 2 mg (12/27/2018), 2 mg (12/23/2018), 2 mg (12/30/2018), 2  mg (01/10/2019), 2 mg (01/17/2019), 2 mg (01/13/2019), 2 mg (01/20/2019), 2 mg (01/31/2019), 2 mg (02/07/2019), 2 mg (02/03/2019), 2 mg (02/10/2019), 2 mg (03/14/2019), 2 mg (03/17/2019), 2 mg (03/21/2019), 2 mg (03/24/2019), 2 mg (02/21/2019), 2 mg (02/24/2019), 2 mg (02/28/2019), 2 mg (03/03/2019), 2 mg (04/04/2019), 2 mg (04/06/2019), 2 mg (04/11/2019), 2 mg (04/14/2019), 2 mg (04/25/2019), 2 mg (04/28/2019), 2 mg (05/02/2019), 2 mg (05/05/2019), 2 mg (05/16/2019), 2 mg (05/19/2019), 2 mg (05/23/2019), 2 mg (05/26/2019), 2 mg (06/06/2019), 2 mg (06/09/2019), 2 mg (06/13/2019), 2 mg (06/16/2019), 2 mg (06/27/2019), 2 mg (06/30/2019), 2 mg (07/04/2019), 2 mg (07/07/2019), 2 mg (07/18/2019), 2 mg (07/21/2019), 2 mg (07/25/2019), 2 mg (07/28/2019), 2 mg (08/08/2019), 2 mg (08/11/2019), 2 mg (08/15/2019), 2 mg (08/18/2019), 2 mg (08/29/2019), 2 mg (09/01/2019), 2 mg (09/05/2019), 2 mg (09/08/2019), 2 mg (09/19/2019), 2 mg (10/04/2019)  for chemotherapy treatment.    Multiple myeloma in relapse ([place])  04/16/2021 Initial Diagnosis   Multiple myeloma in relapse ([place])   04/30/2021 - 04/15/2022 Chemotherapy   Patient is on Treatment Plan : MYELOMA RELAPSED/REFRACTORY KCd q28d     05/12/2022 - 07/08/2022 Chemotherapy   Patient is on Treatment Plan : MYELOMA Daratumumab + Pomalidomide + Dexamethasone q28d x 7 cycles     05/22/2022 -  Chemotherapy   Patient is on Treatment Plan : MYELOMA Daratumumab IV + Pomalidomide + Dexamethasone q28d x 7 cycles       CURRENT THERAPY: Kyprolis and dexamethasone, started on 04/30/2021  INTERVAL HISTORY:  Megan Olson is a 82 y.o. female returns for continued evaluation and management of multiple myeloma. He is here to start cycle 6 of her treatment.   She notes no acute  new symptoms since her last clinic visit.  Had a good Christmas.  No new infection symptoms.  Up to speed with her influenza, COVID-19 and RSV vaccinations. Labs done today were discussed in detail with her. Notes no new toxicities from her current  treatment plan.   MEDICAL HISTORY:  Past Medical History:  Diagnosis Date   Anemia    Glaucoma    HYPERTENSION 03/11/2007   Multiple myeloma ([place])    OSTEOPENIA 03/11/2007   Pre-diabetes    Thyroid cancer ([place])    Thyroid disease    Tubular adenoma of colon 07/2015    SURGICAL HISTORY: Past Surgical History:  Procedure Laterality Date   BONE MARROW BIOPSY     multiple   BREAST EXCISIONAL BIOPSY Right 2000   BREAST LUMPECTOMY  1990   benign   CATARACT EXTRACTION Bilateral 2018   DILATION AND CURETTAGE OF UTERUS     bleeding at menopause. No uterine cancer   IR IMAGING GUIDED PORT INSERTION  05/16/2021   IR RADIOLOGIST EVAL & MGMT  12/13/2018   THYROIDECTOMY N/A 08/02/2020   Procedure: TOTAL THYROIDECTOMY;  Surgeon: [name], MD;  Location: [facility];  Service: General;  Laterality: N/A;   TONSILLECTOMY     age 9   I have reviewed the social history and family history with the patient and they are unchanged from previous note.  ALLERGIES:  is allergic to ace inhibitors, benadryl [diphenhydramine], diamox [acetazolamide], sulfamethoxazole, lenalidomide, and penicillins.  MEDICATIONS:  Current Outpatient Medications  Medication Sig Dispense Refill   acyclovir (ZOVIRAX) 400 MG tablet Take 1 tablet (400 mg total) by mouth 2 (two) times daily. 60 tablet 3   ALPHAGAN P 0.1 % SOLN Place 1 drop into both eyes in the morning, at noon, and at bedtime.      amLODipine (NORVASC) 10 MG tablet Take 1 tablet (10 mg total) by mouth daily. 90 tablet 3   aspirin EC 81 MG tablet Take 81 mg by mouth daily.     Cholecalciferol (VITAMIN D3) 125 MCG (5000 UT) TABS Take 5,000 Units by mouth daily.     Co-Enzyme Q-10 100 MG CAPS Take 100 mg by mouth daily.     dexamethasone (DECADRON) 4 MG tablet Take 5 tablets (20 mg total) by mouth daily. Take the day after daratumumab. Take with breakfast 20 tablet 11   dorzolamide-timolol (COSOPT) 22.3-6.8 MG/ML ophthalmic solution Place 1 drop into both eyes 2  (two) times daily.   11   fentaNYL (DURAGESIC) 12 MCG/HR Place 1 patch onto the skin every 3 (three) days. 10 patch 0   levothyroxine (SYNTHROID) 88 MCG tablet Take 1 tablet (88 mcg total) by mouth daily before breakfast. 90 tablet 3   lidocaine-prilocaine (EMLA) cream Apply to affected area once 30 g 3   LUMIGAN 0.01 % SOLN 1 drop at bedtime. (Patient not taking: Reported on 07/08/2022)     magnesium chloride (SLOW-MAG) 64 MG TBEC SR tablet Take by mouth.     Multiple Vitamins-Minerals (MULTIVITAMIN WITH MINERALS) tablet Take 1 tablet by mouth daily. Centrum Silver 50 +     Netarsudil-Latanoprost (ROCKLATAN) 0.02-0.005 % SOLN Place 1 drop into both eyes daily.     Omega-3 1000 MG CAPS Take 1,000 mg by mouth daily.      ondansetron (ZOFRAN) 8 MG tablet Take 1 tablet (8 mg total) by mouth every 8 (eight) hours as needed for nausea or vomiting. 30 tablet 1   oxyCODONE-acetaminophen (PERCOCET) 5-325 MG tablet Take 1-2 tablets  by mouth every 4 (four) hours as needed for moderate pain or severe pain. 90 tablet 0   polyethylene glycol (MIRALAX) packet Take 17 g by mouth daily. 30 each 1   pomalidomide (POMALYST) 2 MG capsule Take 1 capsule (2 mg total) by mouth daily. Take for 21 days on, 7 days off, repeat every 28 days 21 capsule 0   prochlorperazine (COMPAZINE) 10 MG tablet Take 1 tablet (10 mg total) by mouth every 6 (six) hours as needed for nausea or vomiting. 30 tablet 1   senna-docusate (SENOKOT-S) 8.6-50 MG tablet Take 2 tablets by mouth at bedtime. 60 tablet 0   Simethicone (GAS-X PO) Take 1 tablet by mouth as needed.     vitamin C (ASCORBIC ACID) 500 MG tablet Take 500 mg by mouth 2 (two) times a week.      No current facility-administered medications for this visit.    PHYSICAL EXAMINATION:. Vital signs reviewed . GENERAL:alert, in no acute distress and comfortable SKIN: no acute rashes, no significant lesions EYES: conjunctiva are pink and non-injected, sclera anicteric OROPHARYNX:  MMM, no exudates, no oropharyngeal erythema or ulceration NECK: supple, no JVD LYMPH:  no palpable lymphadenopathy in the cervical, axillary or inguinal regions LUNGS: clear to auscultation b/l with normal respiratory effort HEART: regular rate & rhythm ABDOMEN:  normoactive bowel sounds , non tender, not distended. Extremity: no pedal edema PSYCH: alert & oriented x 3 with fluent speech NEURO: no focal motor/sensory deficits   LABORATORY DATA:   .    Latest Ref Rng & Units 09/29/2022    9:01 AM 09/15/2022    9:01 AM 09/01/2022   11:21 AM  CBC  WBC 4.0 - 10.5 K/uL 3.5  5.9  3.4   Hemoglobin 12.0 - 15.0 g/dL 11.1  11.4  11.4   Hematocrit 36.0 - 46.0 % 32.9  34.2  34.8   Platelets 150 - 400 K/uL 264  212  276       Latest Ref Rng & Units 09/29/2022    9:01 AM 09/15/2022    9:01 AM 09/01/2022   11:21 AM  CMP  Glucose 70 - 99 mg/dL 114  106  108   BUN 8 - 23 mg/dL _0 Creatinine 0.44 - 1.00 mg/dL 0.65  0.57  0.62   Sodium 135 - 145 mmol/L 140  139  139   Potassium 3.5 - 5.1 mmol/L 4.2  4.3  4.1   Chloride 98 - 111 mmol/L 107  106  106   CO2 22 - 32 mmol/L _1 Calcium 8.9 - 10.3 mg/dL 9.3  9.3  9.4   Total Protein 6.5 - 8.1 g/dL 6.2  5.8  6.8   Total Bilirubin 0.3 - 1.2 mg/dL 0.4  0.4  0.3   Alkaline Phos 38 - 126 U/L _2 AST 15 - 41 U/L _3 ALT 0 - 44 U/L _4 RADIOGRAPHIC STUDIES: I have personally reviewed the radiological images as listed and agreed with the findings in the report. No results found.   ASSESSMENT & PLAN:  Megan Olson is a 82 y.o. female who presents for a follow up for multiple myeloma.   1. 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  She achieved complete response with negative M protein and negative PET scan in December  2020. -She subsequently went on Ninlaro maintenance therapy -PET scan from May 08, 2021, which showed focal activity within the right femur, consistent with active multiple myeloma, no other hypermetabolic lesion or plasmacytoma. -05/06/2021 bone marrow biopsy, which showed 3% plasma cells. -Recommend Zometa every 2 months. -Last myeloma labs from 06/30/22 show her M protein is down to 0.6. Treatment orders reviewed and scanned  2. Mild anemia -We will continue to monitor with myeloma treatment and current medication  3.  Cancer related pain  -Currently well controlled -Continue fentanyl patch at 12 mcg/h and Percocet for break through pain.    PLAN: -Here for cycle 6 day 1 of daratumumab pomalidomide chemotherapy. -I reviewed with the patient in detail labs from today. -Myeloma labs from today pending -No new significant toxicities from a current plan of treatment at this time. -Reduce post treatment Dexamethasone to 66m after next treatment planning to wean it off. -Answered all of patient's questions.  -If M spike is stable after cycle 6, cycle 7 and onward will be once a month.  Follow-up: Schedule next 2 cycles of treatment per integrated scheduling orders  The total time spent in the appointment was 30 minutes*.  All of the patient's questions were answered with apparent satisfaction. The patient knows to call the clinic with any problems, questions or concerns.   GSullivan LoneMD MS AAHIVMS STennova Healthcare North Knox[facility] Medical CenterCClifton Surgery Center IncHematology/Oncology Physician CSt. [facility] .*Total Encounter Time as defined by the Centers for Medicare and Medicaid Services includes, in addition to the face-to-face time of a patient visit (documented in the note above) non-face-to-face time: obtaining and reviewing outside history, ordering and reviewing medications, tests or procedures, care coordination (communications with other health care professionals or caregivers) and documentation in the medical  record.

## 2022-10-06 ENCOUNTER — Telehealth: Payer: Self-pay | Admitting: Family Medicine

## 2022-10-06 LAB — MULTIPLE MYELOMA PANEL, SERUM
Albumin SerPl Elph-Mcnc: 3.8 g/dL (ref 2.9–4.4)
Albumin/Glob SerPl: 1.8 — ABNORMAL HIGH (ref 0.7–1.7)
Alpha 1: 0.2 g/dL (ref 0.0–0.4)
Alpha2 Glob SerPl Elph-Mcnc: 0.6 g/dL (ref 0.4–1.0)
B-Globulin SerPl Elph-Mcnc: 1.1 g/dL (ref 0.7–1.3)
Gamma Glob SerPl Elph-Mcnc: 0.2 g/dL — ABNORMAL LOW (ref 0.4–1.8)
Globulin, Total: 2.2 g/dL (ref 2.2–3.9)
IgA: 21 mg/dL — ABNORMAL LOW (ref 64–422)
IgG (Immunoglobin G), Serum: 706 mg/dL (ref 586–1602)
IgM (Immunoglobulin M), Srm: 9 mg/dL — ABNORMAL LOW (ref 26–217)
M Protein SerPl Elph-Mcnc: 0.3 g/dL — ABNORMAL HIGH
Total Protein ELP: 6 g/dL (ref 6.0–8.5)

## 2022-10-06 MED ORDER — AMLODIPINE BESYLATE 10 MG PO TABS: 10.0000 mg | ORAL_TABLET | Freq: Every day | ORAL | 3 refills | Status: DC

## 2022-10-06 NOTE — Telephone Encounter (Signed)
Rx refilled to requested pharmacy below.

## 2022-10-06 NOTE — Telephone Encounter (Signed)
   LAST APPOINTMENT DATE:  02/02/22  NEXT APPOINTMENT DATE: 02/08/23  MEDICATION:  amLODipine (NORVASC) 10 MG tablet   Is the patient out of medication? No-approx. 9 days left  PHARMACY:  [facility] [address] [place] [address] Phone: [phone]  Fax: [phone]      Let patient know to contact pharmacy at the end of the day to make sure medication is ready.  Please notify patient to allow 48-72 hours to process

## 2022-10-12 MED FILL — Dexamethasone Sodium Phosphate Inj 100 MG/10ML: INTRAMUSCULAR | Qty: 2 | Status: AC

## 2022-10-13 ENCOUNTER — Other Ambulatory Visit: Payer: Self-pay

## 2022-10-13 ENCOUNTER — Inpatient Hospital Stay: Payer: Medicare HMO

## 2022-10-13 ENCOUNTER — Inpatient Hospital Stay: Payer: Medicare HMO | Attending: Hematology

## 2022-10-13 VITALS — BP 145/70 | HR 65 | Temp 98.6°F | Resp 18 | Wt 108.0 lb

## 2022-10-13 DIAGNOSIS — Z5112 Encounter for antineoplastic immunotherapy: Secondary | ICD-10-CM | POA: Insufficient documentation

## 2022-10-13 DIAGNOSIS — Z7189 Other specified counseling: Secondary | ICD-10-CM

## 2022-10-13 DIAGNOSIS — Z79899 Other long term (current) drug therapy: Secondary | ICD-10-CM | POA: Diagnosis not present

## 2022-10-13 DIAGNOSIS — C9000 Multiple myeloma not having achieved remission: Secondary | ICD-10-CM

## 2022-10-13 DIAGNOSIS — D649 Anemia, unspecified: Secondary | ICD-10-CM | POA: Insufficient documentation

## 2022-10-13 DIAGNOSIS — Z95828 Presence of other vascular implants and grafts: Secondary | ICD-10-CM

## 2022-10-13 DIAGNOSIS — C9002 Multiple myeloma in relapse: Secondary | ICD-10-CM | POA: Diagnosis present

## 2022-10-13 DIAGNOSIS — G893 Neoplasm related pain (acute) (chronic): Secondary | ICD-10-CM | POA: Insufficient documentation

## 2022-10-13 LAB — CMP (CANCER CENTER ONLY)
ALT: 13 U/L (ref 0–44)
AST: 12 U/L — ABNORMAL LOW (ref 15–41)
Albumin: 4 g/dL (ref 3.5–5.0)
Alkaline Phosphatase: 26 U/L — ABNORMAL LOW (ref 38–126)
Anion gap: 5 (ref 5–15)
BUN: 15 mg/dL (ref 8–23)
CO2: 28 mmol/L (ref 22–32)
Calcium: 9.1 mg/dL (ref 8.9–10.3)
Chloride: 107 mmol/L (ref 98–111)
Creatinine: 0.6 mg/dL (ref 0.44–1.00)
GFR, Estimated: >60 mL/min (ref >60)
Glucose, Bld: 84 mg/dL (ref 70–99)
Potassium: 4.5 mmol/L (ref 3.5–5.1)
Sodium: 140 mmol/L (ref 135–145)
Total Bilirubin: 0.4 mg/dL (ref 0.3–1.2)
Total Protein: 6.3 g/dL — ABNORMAL LOW (ref 6.5–8.1)

## 2022-10-13 LAB — CBC WITH DIFFERENTIAL (CANCER CENTER ONLY)
Abs Immature Granulocytes: 0.02 10*3/uL (ref 0.00–0.07)
Basophils Absolute: 0.1 10*3/uL (ref 0.0–0.1)
Basophils Relative: 3 %
Eosinophils Absolute: 0.2 10*3/uL (ref 0.0–0.5)
Eosinophils Relative: 4 %
HCT: 33.1 % — ABNORMAL LOW (ref 36.0–46.0)
Hemoglobin: 11.3 g/dL — ABNORMAL LOW (ref 12.0–15.0)
Immature Granulocytes: 0 %
Lymphocytes Relative: 6 %
Lymphs Abs: 0.3 10*3/uL — ABNORMAL LOW (ref 0.7–4.0)
MCH: 33.5 pg (ref 26.0–34.0)
MCHC: 34.1 g/dL (ref 30.0–36.0)
MCV: 98.2 fL (ref 80.0–100.0)
Monocytes Absolute: 1 10*3/uL (ref 0.1–1.0)
Monocytes Relative: 17 %
Neutro Abs: 3.9 10*3/uL (ref 1.7–7.7)
Neutrophils Relative %: 70 %
Platelet Count: 201 10*3/uL (ref 150–400)
RBC: 3.37 MIL/uL — ABNORMAL LOW (ref 3.87–5.11)
RDW: 15 % (ref 11.5–15.5)
WBC Count: 5.5 10*3/uL (ref 4.0–10.5)
nRBC: 0 % (ref 0.0–0.2)

## 2022-10-13 MED ORDER — SODIUM CHLORIDE 0.9 % IV SOLN
20.0000 mg | Freq: Once | INTRAVENOUS | Status: AC
  Administered 2022-10-13: 20 mg via INTRAVENOUS
  Filled 2022-10-13: qty 20

## 2022-10-13 MED ORDER — ACETAMINOPHEN 325 MG PO TABS
650.0000 mg | ORAL_TABLET | Freq: Once | ORAL | Status: AC
  Administered 2022-10-13: 650 mg via ORAL
  Filled 2022-10-13: qty 2

## 2022-10-13 MED ORDER — SODIUM CHLORIDE 0.9 % IV SOLN
16.3000 mg/kg | Freq: Once | INTRAVENOUS | Status: AC
  Administered 2022-10-13: 800 mg via INTRAVENOUS
  Filled 2022-10-13: qty 40

## 2022-10-13 MED ORDER — SODIUM CHLORIDE 0.9 % IV SOLN: Freq: Once | INTRAVENOUS | Status: AC

## 2022-10-13 MED ORDER — HYDROXYZINE HCL 25 MG PO TABS
25.0000 mg | ORAL_TABLET | Freq: Once | ORAL | Status: AC
  Administered 2022-10-13: 25 mg via ORAL
  Filled 2022-10-13: qty 1

## 2022-10-13 MED ORDER — SODIUM CHLORIDE 0.9% FLUSH
10.0000 mL | INTRAVENOUS | Status: DC | PRN
  Administered 2022-10-13: 10 mL

## 2022-10-13 MED ORDER — HEPARIN SOD (PORK) LOCK FLUSH 100 UNIT/ML IV SOLN
500.0000 [IU] | Freq: Once | INTRAVENOUS | Status: AC | PRN
  Administered 2022-10-13: 500 [IU]

## 2022-10-13 MED ORDER — ZOLEDRONIC ACID 4 MG/100ML IV SOLN
4.0000 mg | Freq: Once | INTRAVENOUS | Status: AC
  Administered 2022-10-13: 4 mg via INTRAVENOUS
  Filled 2022-10-13: qty 100

## 2022-10-13 MED ORDER — FAMOTIDINE IN NACL 20-0.9 MG/50ML-% IV SOLN
20.0000 mg | Freq: Once | INTRAVENOUS | Status: AC
  Administered 2022-10-13: 20 mg via INTRAVENOUS
  Filled 2022-10-13: qty 50

## 2022-10-13 MED ORDER — SODIUM CHLORIDE 0.9% FLUSH
10.0000 mL | Freq: Once | INTRAVENOUS | Status: AC
  Administered 2022-10-13: 10 mL

## 2022-10-13 NOTE — Progress Notes (Signed)
Per Dr. Irene Limbo, will increase today's dose to '800mg'$  due to continued weight increase.   Laray Anger, PharmD PGY-2 Pharmacy Resident Hematology/Oncology [phone]  10/13/2022 10:14 AM

## 2022-10-13 NOTE — Patient Instructions (Signed)
[facility] ONCOLOGY  Discharge Instructions: Thank you for choosing [facility] to provide your oncology and hematology care.   If you have a lab appointment with the [facility], please go directly to the [facility] and check in at the registration area.   Wear comfortable clothing and clothing appropriate for easy access to any Portacath or PICC line.   We strive to give you quality time with your provider. You may need to reschedule your appointment if you arrive late (15 or more minutes).  Arriving late affects you and other patients whose appointments are after yours.  Also, if you miss three or more appointments without notifying the office, you may be dismissed from the clinic at the provider's discretion.      For prescription refill requests, have your pharmacy contact our office and allow 72 hours for refills to be completed.    Today you received the following chemotherapy and/or immunotherapy agents: daratumumab     To help prevent nausea and vomiting after your treatment, we encourage you to take your nausea medication as directed.  BELOW ARE SYMPTOMS THAT SHOULD BE REPORTED IMMEDIATELY: *FEVER GREATER THAN 100.4 F (38 C) OR HIGHER *CHILLS OR SWEATING *NAUSEA AND VOMITING THAT IS NOT CONTROLLED WITH YOUR NAUSEA MEDICATION *UNUSUAL SHORTNESS OF BREATH *UNUSUAL BRUISING OR BLEEDING *URINARY PROBLEMS (pain or burning when urinating, or frequent urination) *BOWEL PROBLEMS (unusual diarrhea, constipation, pain near the anus) TENDERNESS IN MOUTH AND THROAT WITH OR WITHOUT PRESENCE OF ULCERS (sore throat, sores in mouth, or a toothache) UNUSUAL RASH, SWELLING OR PAIN  UNUSUAL VAGINAL DISCHARGE OR ITCHING   Items with * indicate a potential emergency and should be followed up as soon as possible or go to the Emergency Department if any problems should occur.  Please show the CHEMOTHERAPY ALERT CARD or IMMUNOTHERAPY ALERT CARD at check-in to  the Emergency Department and triage nurse.  Should you have questions after your visit or need to cancel or reschedule your appointment, please contact [facility]  Dept: [phone]  and follow the prompts.  Office hours are 8:00 a.m. to 4:30 p.m. Monday - Friday. Please note that voicemails left after 4:00 p.m. may not be returned until the following business day.  We are closed weekends and major holidays. You have access to a nurse at all times for urgent questions. Please call the main number to the clinic Dept: [phone] and follow the prompts.   For any non-urgent questions, you may also contact your provider using MyChart. We now offer e-Visits for anyone 68 and older to request care online for non-urgent symptoms. For details visit [email or web address].   Also download the MyChart app! Go to the app store, search "MyChart", open the app, select [facility], and log in with your MyChart username and password.  Masks are optional in the cancer centers. If you would like for your care team to wear a mask while they are taking care of you, please let them know. You may have one support person who is at least 82 years old accompany you for your appointments.

## 2022-10-15 ENCOUNTER — Other Ambulatory Visit: Payer: Self-pay

## 2022-10-15 DIAGNOSIS — Z7189 Other specified counseling: Secondary | ICD-10-CM

## 2022-10-15 DIAGNOSIS — C9002 Multiple myeloma in relapse: Secondary | ICD-10-CM

## 2022-10-15 MED ORDER — POMALIDOMIDE 2 MG PO CAPS: 2.0000 mg | ORAL_CAPSULE | Freq: Every day | ORAL | 0 refills | Status: DC

## 2022-10-16 ENCOUNTER — Other Ambulatory Visit: Payer: Self-pay

## 2022-10-16 DIAGNOSIS — C9000 Multiple myeloma not having achieved remission: Secondary | ICD-10-CM

## 2022-10-19 ENCOUNTER — Encounter: Payer: Self-pay | Admitting: Hematology

## 2022-10-19 ENCOUNTER — Other Ambulatory Visit: Payer: Self-pay | Admitting: Hematology

## 2022-10-19 DIAGNOSIS — C9000 Multiple myeloma not having achieved remission: Secondary | ICD-10-CM

## 2022-10-19 MED ORDER — OXYCODONE-ACETAMINOPHEN 5-325 MG PO TABS: 1.0000 | ORAL_TABLET | ORAL | 0 refills | Status: AC | PRN

## 2022-10-19 MED ORDER — FENTANYL 12 MCG/HR TD PT72: 1.0000 | MEDICATED_PATCH | TRANSDERMAL | 0 refills | Status: DC

## 2022-10-26 MED FILL — Dexamethasone Sodium Phosphate Inj 100 MG/10ML: INTRAMUSCULAR | Qty: 2 | Status: AC

## 2022-10-27 ENCOUNTER — Other Ambulatory Visit: Payer: Self-pay

## 2022-10-27 ENCOUNTER — Inpatient Hospital Stay: Payer: Medicare HMO

## 2022-10-27 ENCOUNTER — Encounter: Payer: Self-pay | Admitting: Hematology

## 2022-10-27 ENCOUNTER — Inpatient Hospital Stay: Payer: Medicare HMO | Admitting: Hematology

## 2022-10-27 VITALS — BP 158/75 | HR 72 | Temp 97.9°F | Resp 18 | Wt 108.2 lb

## 2022-10-27 VITALS — BP 142/70 | HR 62 | Temp 98.2°F | Resp 18

## 2022-10-27 DIAGNOSIS — Z7189 Other specified counseling: Secondary | ICD-10-CM

## 2022-10-27 DIAGNOSIS — C9002 Multiple myeloma in relapse: Secondary | ICD-10-CM | POA: Diagnosis not present

## 2022-10-27 DIAGNOSIS — Z79899 Other long term (current) drug therapy: Secondary | ICD-10-CM | POA: Diagnosis not present

## 2022-10-27 DIAGNOSIS — Z5112 Encounter for antineoplastic immunotherapy: Secondary | ICD-10-CM | POA: Diagnosis not present

## 2022-10-27 DIAGNOSIS — D649 Anemia, unspecified: Secondary | ICD-10-CM | POA: Diagnosis not present

## 2022-10-27 DIAGNOSIS — G893 Neoplasm related pain (acute) (chronic): Secondary | ICD-10-CM | POA: Diagnosis not present

## 2022-10-27 LAB — CMP (CANCER CENTER ONLY)
ALT: 12 U/L (ref 0–44)
AST: 12 U/L — ABNORMAL LOW (ref 15–41)
Albumin: 3.9 g/dL (ref 3.5–5.0)
Alkaline Phosphatase: 33 U/L — ABNORMAL LOW (ref 38–126)
Anion gap: 4 — ABNORMAL LOW (ref 5–15)
BUN: 18 mg/dL (ref 8–23)
CO2: 28 mmol/L (ref 22–32)
Calcium: 9.1 mg/dL (ref 8.9–10.3)
Chloride: 106 mmol/L (ref 98–111)
Creatinine: 0.64 mg/dL (ref 0.44–1.00)
GFR, Estimated: >60 mL/min (ref >60)
Glucose, Bld: 87 mg/dL (ref 70–99)
Potassium: 4.3 mmol/L (ref 3.5–5.1)
Sodium: 138 mmol/L (ref 135–145)
Total Bilirubin: 0.4 mg/dL (ref 0.3–1.2)
Total Protein: 6 g/dL — ABNORMAL LOW (ref 6.5–8.1)

## 2022-10-27 LAB — CBC WITH DIFFERENTIAL (CANCER CENTER ONLY)
Abs Immature Granulocytes: 0.01 10*3/uL (ref 0.00–0.07)
Basophils Absolute: 0.2 10*3/uL — ABNORMAL HIGH (ref 0.0–0.1)
Basophils Relative: 4 %
Eosinophils Absolute: 0.1 10*3/uL (ref 0.0–0.5)
Eosinophils Relative: 4 %
HCT: 33.9 % — ABNORMAL LOW (ref 36.0–46.0)
Hemoglobin: 11.3 g/dL — ABNORMAL LOW (ref 12.0–15.0)
Immature Granulocytes: 0 %
Lymphocytes Relative: 11 %
Lymphs Abs: 0.4 10*3/uL — ABNORMAL LOW (ref 0.7–4.0)
MCH: 32.7 pg (ref 26.0–34.0)
MCHC: 33.3 g/dL (ref 30.0–36.0)
MCV: 98 fL (ref 80.0–100.0)
Monocytes Absolute: 0.7 10*3/uL (ref 0.1–1.0)
Monocytes Relative: 19 %
Neutro Abs: 2.4 10*3/uL (ref 1.7–7.7)
Neutrophils Relative %: 62 %
Platelet Count: 316 10*3/uL (ref 150–400)
RBC: 3.46 MIL/uL — ABNORMAL LOW (ref 3.87–5.11)
RDW: 14.9 % (ref 11.5–15.5)
WBC Count: 3.9 10*3/uL — ABNORMAL LOW (ref 4.0–10.5)
nRBC: 0 % (ref 0.0–0.2)

## 2022-10-27 MED ORDER — SODIUM CHLORIDE 0.9 % IV SOLN
16.3000 mg/kg | Freq: Once | INTRAVENOUS | Status: AC
  Administered 2022-10-27: 800 mg via INTRAVENOUS
  Filled 2022-10-27: qty 40

## 2022-10-27 MED ORDER — SODIUM CHLORIDE 0.9 % IV SOLN
16.0000 mg | Freq: Once | INTRAVENOUS | Status: AC
  Administered 2022-10-27: 16 mg via INTRAVENOUS
  Filled 2022-10-27: qty 1.6

## 2022-10-27 MED ORDER — SODIUM CHLORIDE 0.9 % IV SOLN: Freq: Once | INTRAVENOUS | Status: AC

## 2022-10-27 MED ORDER — SODIUM CHLORIDE 0.9% FLUSH: 10.0000 mL | INTRAVENOUS | Status: DC | PRN

## 2022-10-27 MED ORDER — FAMOTIDINE IN NACL 20-0.9 MG/50ML-% IV SOLN
20.0000 mg | Freq: Once | INTRAVENOUS | Status: AC
  Administered 2022-10-27: 20 mg via INTRAVENOUS
  Filled 2022-10-27: qty 50

## 2022-10-27 MED ORDER — HEPARIN SOD (PORK) LOCK FLUSH 100 UNIT/ML IV SOLN: 500.0000 [IU] | Freq: Once | INTRAVENOUS | Status: DC | PRN

## 2022-10-27 MED ORDER — ACETAMINOPHEN 325 MG PO TABS
650.0000 mg | ORAL_TABLET | Freq: Once | ORAL | Status: AC
  Administered 2022-10-27: 650 mg via ORAL
  Filled 2022-10-27: qty 2

## 2022-10-27 MED ORDER — HYDROXYZINE HCL 25 MG PO TABS
25.0000 mg | ORAL_TABLET | Freq: Once | ORAL | Status: AC
  Administered 2022-10-27: 25 mg via ORAL
  Filled 2022-10-27: qty 1

## 2022-10-27 NOTE — Progress Notes (Signed)
Patient seen by MD today  Vitals are within treatment parameters. Dr Irene Limbo aware BP 158/75  Labs reviewed: and are within treatment parameters."  Per physician team, patient is ready for treatment and there are NO modifications to the treatment plan.

## 2022-10-27 NOTE — Progress Notes (Signed)
[place]   [phone] [phone]   Clinic Follow up Note   Patient Care Team: [name], MD as PCP - General (Family Medicine) [name], MD as Consulting Physician (Hematology) Bond, [name], MD as Referring Physician (Ophthalmology) [name], [name], MD as Consulting Physician (Hematology and Oncology) [name], MD as Consulting Physician (Internal Medicine) [name], MD as Consulting Physician (General Surgery) [name], [facility] as Pharmacist (Pharmacist)  Date of Service: 10/27/2022  CHIEF COMPLAINT:  Follow-up for continuation and management of multiple myeloma   SUMMARY OF ONCOLOGIC HISTORY: Oncology History  Multiple myeloma not having achieved remission ([place])  12/12/2018 Initial Diagnosis   Multiple myeloma not having achieved remission ([place])   12/20/2018 - 10/04/2019 Chemotherapy   The patient had dexamethasone (DECADRON) tablet 20 mg, 20 mg (100 % of original dose 20 mg), Oral, Once, 14 of 14 cycles Dose modification: 20 mg (original dose 20 mg, Cycle 1), 10 mg (original dose 20 mg, Cycle 14) Administration: 20 mg (12/20/2018), 20 mg (12/27/2018), 20 mg (01/10/2019), 20 mg (01/17/2019), 20 mg (01/31/2019), 20 mg (02/07/2019), 20 mg (03/14/2019), 20 mg (03/21/2019), 20 mg (02/21/2019), 20 mg (02/28/2019), 20 mg (04/04/2019), 20 mg (04/11/2019), 20 mg (04/25/2019), 20 mg (05/02/2019), 20 mg (05/16/2019), 20 mg (05/23/2019), 20 mg (06/06/2019), 20 mg (06/13/2019), 20 mg (06/27/2019), 20 mg (07/04/2019), 20 mg (07/18/2019), 20 mg (07/25/2019), 20 mg (08/08/2019), 20 mg (08/15/2019), 20 mg (08/29/2019), 20 mg (09/05/2019), 10 mg (09/19/2019) lenalidomide (REVLIMID) 15 MG capsule, 1 of 1 cycle, Start date: 01/18/2019, End date: 02/21/2019 bortezomib SQ (VELCADE) chemo injection 2 mg, 1.3 mg/m2 = 2 mg, Subcutaneous,  Once, 14 of 14 cycles Administration: 2 mg (12/20/2018), 2 mg (12/27/2018), 2 mg (12/23/2018), 2 mg (12/30/2018),  2 mg (01/10/2019), 2 mg (01/17/2019), 2 mg (01/13/2019), 2 mg (01/20/2019), 2 mg (01/31/2019), 2 mg (02/07/2019), 2 mg (02/03/2019), 2 mg (02/10/2019), 2 mg (03/14/2019), 2 mg (03/17/2019), 2 mg (03/21/2019), 2 mg (03/24/2019), 2 mg (02/21/2019), 2 mg (02/24/2019), 2 mg (02/28/2019), 2 mg (03/03/2019), 2 mg (04/04/2019), 2 mg (04/06/2019), 2 mg (04/11/2019), 2 mg (04/14/2019), 2 mg (04/25/2019), 2 mg (04/28/2019), 2 mg (05/02/2019), 2 mg (05/05/2019), 2 mg (05/16/2019), 2 mg (05/19/2019), 2 mg (05/23/2019), 2 mg (05/26/2019), 2 mg (06/06/2019), 2 mg (06/09/2019), 2 mg (06/13/2019), 2 mg (06/16/2019), 2 mg (06/27/2019), 2 mg (06/30/2019), 2 mg (07/04/2019), 2 mg (07/07/2019), 2 mg (07/18/2019), 2 mg (07/21/2019), 2 mg (07/25/2019), 2 mg (07/28/2019), 2 mg (08/08/2019), 2 mg (08/11/2019), 2 mg (08/15/2019), 2 mg (08/18/2019), 2 mg (08/29/2019), 2 mg (09/01/2019), 2 mg (09/05/2019), 2 mg (09/08/2019), 2 mg (09/19/2019), 2 mg (10/04/2019)  for chemotherapy treatment.    Multiple myeloma in relapse ([place])  04/16/2021 Initial Diagnosis   Multiple myeloma in relapse ([place])   04/30/2021 - 04/15/2022 Chemotherapy   Patient is on Treatment Plan : MYELOMA RELAPSED/REFRACTORY KCd q28d     05/12/2022 - 07/08/2022 Chemotherapy   Patient is on Treatment Plan : MYELOMA Daratumumab + Pomalidomide + Dexamethasone q28d x 7 cycles     05/22/2022 -  Chemotherapy   Patient is on Treatment Plan : MYELOMA Daratumumab IV + Pomalidomide + Dexamethasone q28d x 7 cycles       CURRENT THERAPY: Dara/Pom/Dex  INTERVAL HISTORY:  Megan Olson is a 82 y.o. female returns for continued evaluation and management of multiple myeloma. He is here to start cycle 7 of her treatment.   Patient was last seen by me on 09/29/2022 and  she was doing well overall.   Patient reports she is doing well overall since our last visit. Patient complains of minor skin spots, but not severe. She also reports of a skin spot near her right eye for couple of weeks. She also notes occasional dry cough  without phlegm. She notes that this cough is chronic. Patient denies any side effects with reduced dosage of steroid.  She denies fever, chills, night sweats, bone pain, abdominal pain, chest pain, back pain, or leg swelling.  She is up to speed with her influenza, COVID-19 and RSV vaccinations.    MEDICAL HISTORY:  Past Medical History:  Diagnosis Date   Anemia    Glaucoma    HYPERTENSION 03/11/2007   Multiple myeloma ([place])    OSTEOPENIA 03/11/2007   Pre-diabetes    Thyroid cancer ([place])    Thyroid disease    Tubular adenoma of colon 07/2015    SURGICAL HISTORY: Past Surgical History:  Procedure Laterality Date   BONE MARROW BIOPSY     multiple   BREAST EXCISIONAL BIOPSY Right 2000   BREAST LUMPECTOMY  1990   benign   CATARACT EXTRACTION Bilateral 2018   DILATION AND CURETTAGE OF UTERUS     bleeding at menopause. No uterine cancer   IR IMAGING GUIDED PORT INSERTION  05/16/2021   IR RADIOLOGIST EVAL & MGMT  12/13/2018   THYROIDECTOMY N/A 08/02/2020   Procedure: TOTAL THYROIDECTOMY;  Surgeon: [name], MD;  Location: [facility];  Service: General;  Laterality: N/A;   TONSILLECTOMY     age 24   I have reviewed the social history and family history with the patient and they are unchanged from previous note.  ALLERGIES:  is allergic to ace inhibitors, benadryl [diphenhydramine], diamox [acetazolamide], sulfamethoxazole, lenalidomide, and penicillins.  MEDICATIONS:  Current Outpatient Medications  Medication Sig Dispense Refill   acyclovir (ZOVIRAX) 400 MG tablet Take 1 tablet (400 mg total) by mouth 2 (two) times daily. 60 tablet 3   ALPHAGAN P 0.1 % SOLN Place 1 drop into both eyes in the morning, at noon, and at bedtime.      amLODipine (NORVASC) 10 MG tablet Take 1 tablet (10 mg total) by mouth daily. 90 tablet 3   aspirin EC 81 MG tablet Take 81 mg by mouth daily.     Cholecalciferol (VITAMIN D3) 125 MCG (5000 UT) TABS Take 5,000 Units by mouth daily.     Co-Enzyme Q-10  100 MG CAPS Take 100 mg by mouth daily.     dexamethasone (DECADRON) 4 MG tablet Take 5 tablets (20 mg total) by mouth daily. Take the day after daratumumab. Take with breakfast 20 tablet 11   dorzolamide-timolol (COSOPT) 22.3-6.8 MG/ML ophthalmic solution Place 1 drop into both eyes 2 (two) times daily.   11   fentaNYL (DURAGESIC) 12 MCG/HR Place 1 patch onto the skin every 3 (three) days. 10 patch 0   levothyroxine (SYNTHROID) 88 MCG tablet Take 1 tablet (88 mcg total) by mouth daily before breakfast. 90 tablet 3   lidocaine-prilocaine (EMLA) cream Apply to affected area once 30 g 3   LUMIGAN 0.01 % SOLN 1 drop at bedtime. (Patient not taking: Reported on 07/08/2022)     magnesium chloride (SLOW-MAG) 64 MG TBEC SR tablet Take by mouth.     Multiple Vitamins-Minerals (MULTIVITAMIN WITH MINERALS) tablet Take 1 tablet by mouth daily. Centrum Silver 50 +     Netarsudil-Latanoprost (ROCKLATAN) 0.02-0.005 % SOLN Place 1 drop into both eyes daily.  Omega-3 1000 MG CAPS Take 1,000 mg by mouth daily.      ondansetron (ZOFRAN) 8 MG tablet Take 1 tablet (8 mg total) by mouth every 8 (eight) hours as needed for nausea or vomiting. 30 tablet 1   oxyCODONE-acetaminophen (PERCOCET) 5-325 MG tablet Take 1-2 tablets by mouth every 4 (four) hours as needed for moderate pain or severe pain. 90 tablet 0   polyethylene glycol (MIRALAX) packet Take 17 g by mouth daily. 30 each 1   pomalidomide (POMALYST) 2 MG capsule Take 1 capsule (2 mg total) by mouth daily. Take for 21 days on, 7 days off, repeat every 28 days 21 capsule 0   prochlorperazine (COMPAZINE) 10 MG tablet Take 1 tablet (10 mg total) by mouth every 6 (six) hours as needed for nausea or vomiting. 30 tablet 1   senna-docusate (SENOKOT-S) 8.6-50 MG tablet Take 2 tablets by mouth at bedtime. 60 tablet 0   Simethicone (GAS-X PO) Take 1 tablet by mouth as needed.     vitamin C (ASCORBIC ACID) 500 MG tablet Take 500 mg by mouth 2 (two) times a week.      No  current facility-administered medications for this visit.    PHYSICAL EXAMINATION:. .BP (!) 158/75   Pulse 72   Temp 97.9 F (36.6 C)   Resp 18   Wt 108 lb 3.2 oz (49.1 kg)   SpO2 100%   BMI 21.13 kg/m   GENERAL:alert, in no acute distress and comfortable SKIN: no acute rashes, no significant lesions EYES: conjunctiva are pink and non-injected, sclera anicteric OROPHARYNX: MMM, no exudates, no oropharyngeal erythema or ulceration NECK: supple, no JVD LYMPH:  no palpable lymphadenopathy in the cervical, axillary or inguinal regions LUNGS: clear to auscultation b/l with normal respiratory effort HEART: regular rate & rhythm ABDOMEN:  normoactive bowel sounds , non tender, not distended. Extremity: no pedal edema PSYCH: alert & oriented x 3 with fluent speech NEURO: no focal motor/sensory deficits   LABORATORY DATA:   .    Latest Ref Rng & Units 10/27/2022    9:06 AM 10/13/2022    8:40 AM 09/29/2022    9:01 AM  CBC  WBC 4.0 - 10.5 K/uL 3.9  5.5  3.5   Hemoglobin 12.0 - 15.0 g/dL 11.3  11.3  11.1   Hematocrit 36.0 - 46.0 % 33.9  33.1  32.9   Platelets 150 - 400 K/uL 316  201  264       Latest Ref Rng & Units 10/27/2022    9:06 AM 10/13/2022    8:40 AM 09/29/2022    9:01 AM  CMP  Glucose 70 - 99 mg/dL 87  84  114   BUN 8 - 23 mg/dL '18  15  17   '$ Creatinine 0.44 - 1.00 mg/dL 0.64  0.60  0.65   Sodium 135 - 145 mmol/L 138  140  140   Potassium 3.5 - 5.1 mmol/L 4.3  4.5  4.2   Chloride 98 - 111 mmol/L 106  107  107   CO2 22 - 32 mmol/L '28  28  28   '$ Calcium 8.9 - 10.3 mg/dL 9.1  9.1  9.3   Total Protein 6.5 - 8.1 g/dL 6.0  6.3  6.2   Total Bilirubin 0.3 - 1.2 mg/dL 0.4  0.4  0.4   Alkaline Phos 38 - 126 U/L 33  26  24   AST 15 - 41 U/L '12  12  13   '$ ALT 0 -  44 U/L '12  13  13       '$ RADIOGRAPHIC STUDIES: I have personally reviewed the radiological images as listed and agreed with the findings in the report. No results found.   ASSESSMENT & PLAN:   Megan Olson is a  82 y.o. female who presents for a follow up for multiple myeloma.   1. 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  She achieved complete response with negative M protein and negative PET scan in December 2020. -She subsequently went on Ninlaro maintenance therapy -PET scan from May 08, 2021, which showed focal activity within the right femur, consistent with active multiple myeloma, no other hypermetabolic lesion or plasmacytoma. -05/06/2021 bone marrow biopsy, which showed 3% plasma cells. -Recommend Zometa every 2 months. -Last myeloma labs from 06/30/22 show her M protein is down to 0.6. Treatment orders reviewed and scanned  2. Mild anemia -We will continue to monitor with myeloma treatment and current medication  3.  Cancer related pain  -Currently well controlled -Continue fentanyl patch at 12 mcg/h and Percocet for break through pain.    PLAN: -Discussed lab results from today, 10/27/2022, with the patient. CBC and CMP stable. M spike stable at 0.3.  - Recommended to use humidifier.  -We will reduce the pre-treatment dosage of Dexamethasone to 16 mg.  -Continue with cycle 7 treatment.  -No new significant toxicities from a current plan of treatment with Dara/Pom/Dex at this time. -Answered all of patient's questions.  Treatment orders reviewed and signed. Continue Zometa every 8 weeks. No new dental issues.  Follow-up: F/u per integrated scheduling  The total time spent in the appointment was 30 minutes* .  All of the patient's questions were answered with apparent satisfaction. The patient knows to call the clinic with any problems, questions or concerns.   Sullivan Lone MD MS AAHIVMS [facility] Hematology/Oncology Physician [facility]  .*Total Encounter Time as defined by the Centers for  Medicare and Medicaid Services includes, in addition to the face-to-face time of a patient visit (documented in the note above) non-face-to-face time: obtaining and reviewing outside history, ordering and reviewing medications, tests or procedures, care coordination (communications with other health care professionals or caregivers) and documentation in the medical record.   I, Cleda Mccreedy, am acting as a Education administrator for Sullivan Lone, MD. .I have reviewed the above documentation for accuracy and completeness, and I agree with the above. [name] MD

## 2022-10-27 NOTE — Patient Instructions (Signed)
[facility]  Discharge Instructions: Thank you for choosing [facility] to provide your oncology and hematology care.   If you have a lab appointment with the Cancer Center, please go directly to the Cancer Center and check in at the registration area.   Wear comfortable clothing and clothing appropriate for easy access to any Portacath or PICC line.   We strive to give you quality time with your provider. You may need to reschedule your appointment if you arrive late (15 or more minutes).  Arriving late affects you and other patients whose appointments are after yours.  Also, if you miss three or more appointments without notifying the office, you may be dismissed from the clinic at the provider's discretion.      For prescription refill requests, have your pharmacy contact our office and allow 72 hours for refills to be completed.    Today you received the following chemotherapy and/or immunotherapy agents: daratumumab     To help prevent nausea and vomiting after your treatment, we encourage you to take your nausea medication as directed.  BELOW ARE SYMPTOMS THAT SHOULD BE REPORTED IMMEDIATELY: *FEVER GREATER THAN 100.4 F (38 C) OR HIGHER *CHILLS OR SWEATING *NAUSEA AND VOMITING THAT IS NOT CONTROLLED WITH YOUR NAUSEA MEDICATION *UNUSUAL SHORTNESS OF BREATH *UNUSUAL BRUISING OR BLEEDING *URINARY PROBLEMS (pain or burning when urinating, or frequent urination) *BOWEL PROBLEMS (unusual diarrhea, constipation, pain near the anus) TENDERNESS IN MOUTH AND THROAT WITH OR WITHOUT PRESENCE OF ULCERS (sore throat, sores in mouth, or a toothache) UNUSUAL RASH, SWELLING OR PAIN  UNUSUAL VAGINAL DISCHARGE OR ITCHING   Items with * indicate a potential emergency and should be followed up as soon as possible or go to the Emergency Department if any problems should occur.  Please show the CHEMOTHERAPY ALERT CARD or IMMUNOTHERAPY ALERT CARD at  check-in to the Emergency Department and triage nurse.  Should you have questions after your visit or need to cancel or reschedule your appointment, please contact [facility]  Dept: [phone]  and follow the prompts.  Office hours are 8:00 a.m. to 4:30 p.m. Monday - Friday. Please note that voicemails left after 4:00 p.m. may not be returned until the following business day.  We are closed weekends and major holidays. You have access to a nurse at all times for urgent questions. Please call the main number to the clinic Dept: [phone] and follow the prompts.   For any non-urgent questions, you may also contact your provider using MyChart. We now offer e-Visits for anyone 18 and older to request care online for non-urgent symptoms. For details visit [email or web address].   Also download the MyChart app! Go to the app store, search "MyChart", open the app, select [facility], and log in with your MyChart username and password.  Masks are optional in the cancer centers. If you would like for your care team to wear a mask while they are taking care of you, please let them know. You may have one support person who is at least 82 years old accompany you for your appointments. 

## 2022-11-02 ENCOUNTER — Encounter: Payer: Self-pay | Admitting: Hematology

## 2022-11-02 LAB — MULTIPLE MYELOMA PANEL, SERUM
Albumin SerPl Elph-Mcnc: 3.8 g/dL (ref 2.9–4.4)
Albumin/Glob SerPl: 1.7 (ref 0.7–1.7)
Alpha 1: 0.2 g/dL (ref 0.0–0.4)
Alpha2 Glob SerPl Elph-Mcnc: 0.7 g/dL (ref 0.4–1.0)
B-Globulin SerPl Elph-Mcnc: 1.3 g/dL (ref 0.7–1.3)
Gamma Glob SerPl Elph-Mcnc: 0.2 g/dL — ABNORMAL LOW (ref 0.4–1.8)
Globulin, Total: 2.3 g/dL (ref 2.2–3.9)
IgA: 23 mg/dL — ABNORMAL LOW (ref 64–422)
IgG (Immunoglobin G), Serum: 695 mg/dL (ref 586–1602)
IgM (Immunoglobulin M), Srm: 9 mg/dL — ABNORMAL LOW (ref 26–217)
M Protein SerPl Elph-Mcnc: 0.5 g/dL — ABNORMAL HIGH
Total Protein ELP: 6.1 g/dL (ref 6.0–8.5)

## 2022-11-04 ENCOUNTER — Other Ambulatory Visit: Payer: Self-pay

## 2022-11-04 DIAGNOSIS — C9002 Multiple myeloma in relapse: Secondary | ICD-10-CM

## 2022-11-04 MED ORDER — DEXAMETHASONE 4 MG PO TABS: 16.0000 mg | ORAL_TABLET | ORAL | 3 refills | Status: DC

## 2022-11-04 NOTE — Progress Notes (Signed)
After discussion with the provider, the patient will begin taking their premedications at home prior to daratumumab infusions. Premedications to be taken at home include Dexamethasone, starting 11/24/2022. A calendar has been mailed to the patient's address.  Laray Anger, PharmD PGY-2 Pharmacy Resident Hematology/Oncology [phone]  11/04/2022 2:49 PM

## 2022-11-12 ENCOUNTER — Encounter: Payer: Self-pay | Admitting: Hematology

## 2022-11-13 ENCOUNTER — Other Ambulatory Visit: Payer: Self-pay

## 2022-11-13 DIAGNOSIS — Z7189 Other specified counseling: Secondary | ICD-10-CM

## 2022-11-13 DIAGNOSIS — C9002 Multiple myeloma in relapse: Secondary | ICD-10-CM

## 2022-11-13 DIAGNOSIS — C9000 Multiple myeloma not having achieved remission: Secondary | ICD-10-CM

## 2022-11-13 MED ORDER — FENTANYL 12 MCG/HR TD PT72: 1.0000 | MEDICATED_PATCH | TRANSDERMAL | 0 refills | Status: DC

## 2022-11-13 MED ORDER — POMALIDOMIDE 2 MG PO CAPS: 2.0000 mg | ORAL_CAPSULE | Freq: Every day | ORAL | 0 refills | Status: DC

## 2022-11-18 ENCOUNTER — Other Ambulatory Visit: Payer: Self-pay

## 2022-11-18 DIAGNOSIS — C9002 Multiple myeloma in relapse: Secondary | ICD-10-CM

## 2022-11-18 IMAGING — CT NM PET IMAGE RESTAGE (PS) WHOLE BODY
7 series · 25 of 25 positions shown · non-contrast
Comparison: 05/08/2021

CLINICAL DATA: Subsequent treatment strategy for multiple myeloma.

EXAM:
NUCLEAR MEDICINE PET WHOLE BODY
TECHNIQUE: 5.2 mCi F-18 FDG was injected intravenously. Full-ring PET imaging
was performed from the head to foot after the radiotracer. CT data
was obtained and used for attenuation correction and anatomic
localization.
Fasting blood glucose: 115 mg/dl

[Series 3: pet wb ac · axial · 5.0mm · 4.07mm/px · z∈[+208,+1840]mm · 5 of 409 slices shown]
[im 1/409]
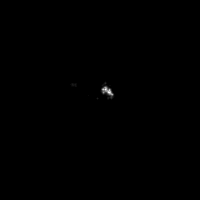
[im 103/409]
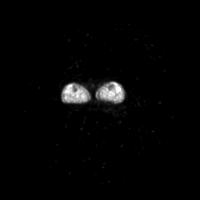
[im 205/409]
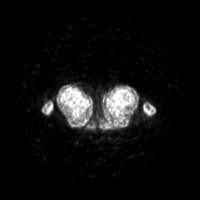
[im 307/409]
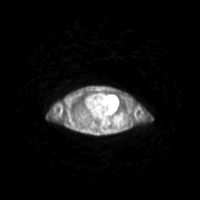
[im 409/409]
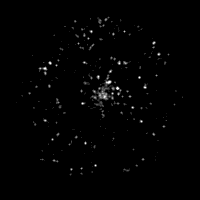

[Series 4: ct wb 5.0 bf37 · axial · 5.0mm · 0.98mm/px · z∈[+208,+1840]mm · 5 of 409 slices shown]
[im 1/409  soft-tissue]
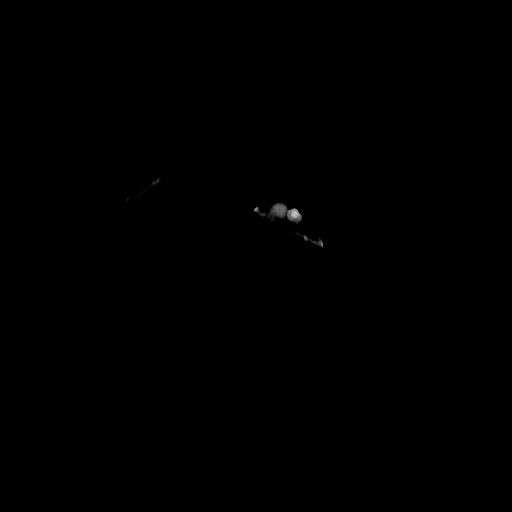
[im 103/409  soft-tissue]
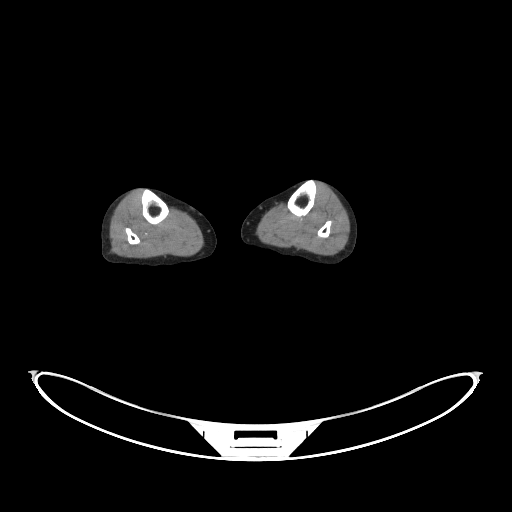
[im 205/409  soft-tissue]
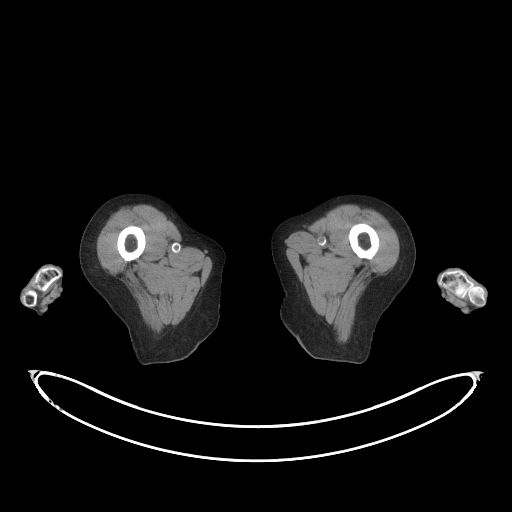
[im 307/409  soft-tissue]
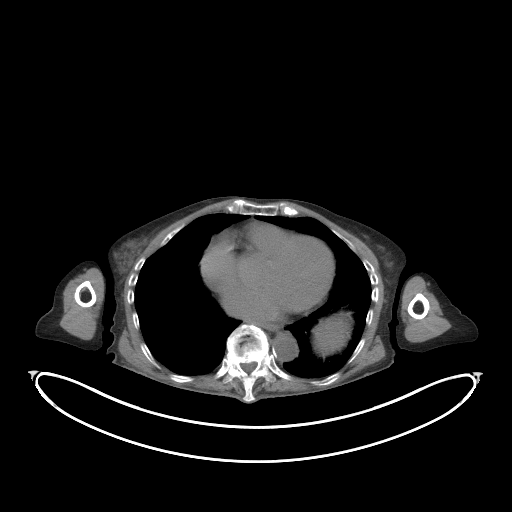
[im 409/409  soft-tissue]
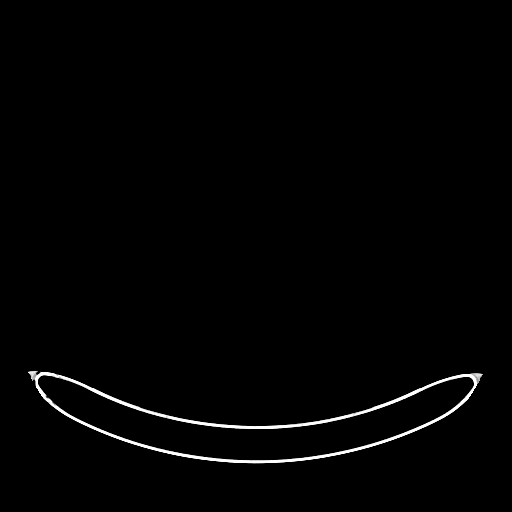

[Series 5: pet wb nac · axial · 5.0mm · 4.07mm/px · z∈[+208,+1840]mm · 6 of 409 slices shown]
[im 1/409]
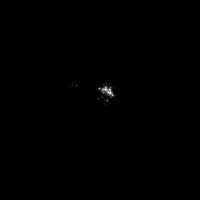
[im 82/409]
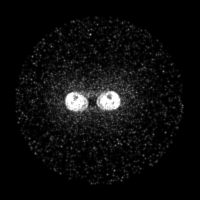
[im 164/409]
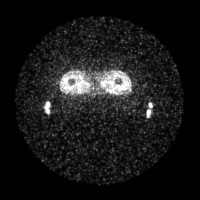
[im 245/409]
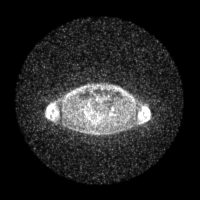
[im 327/409]
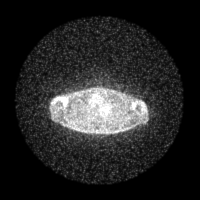
[im 409/409]
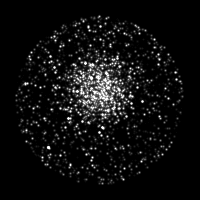

[Series 8: ct wb 5.0 br59 lung_bone · axial · 5.0mm · 0.59mm/px · 1 of 53 slices shown]
[im 1/53  lung]
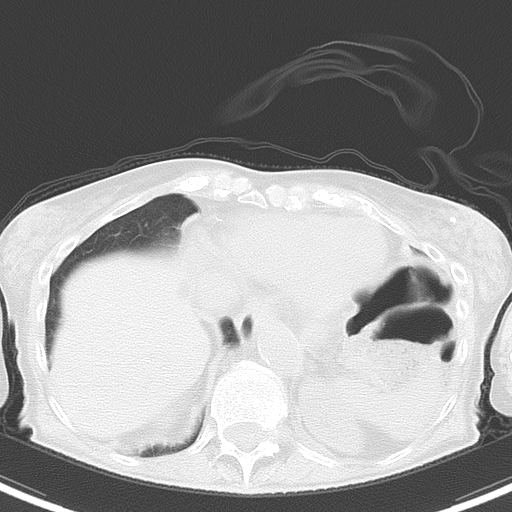

[Series 603: fused cor · 1 of 40 slices shown]
[im 1/40]
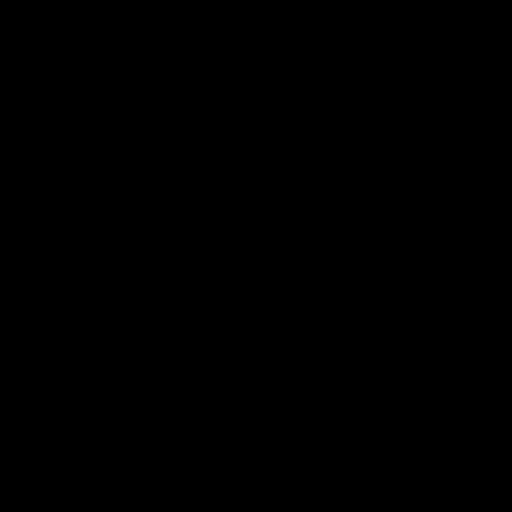

[Series 604: <mip collection> · coronal · 3.38mm/px · 1 of 32 slices shown]
[im 1/32]
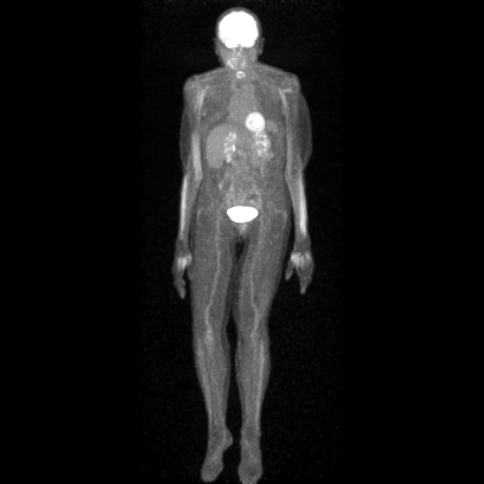

[Series 605: range-ct wb 5.0 bf37-tra-<alpha range> · 6 of 395 slices shown]
[im 1/395]
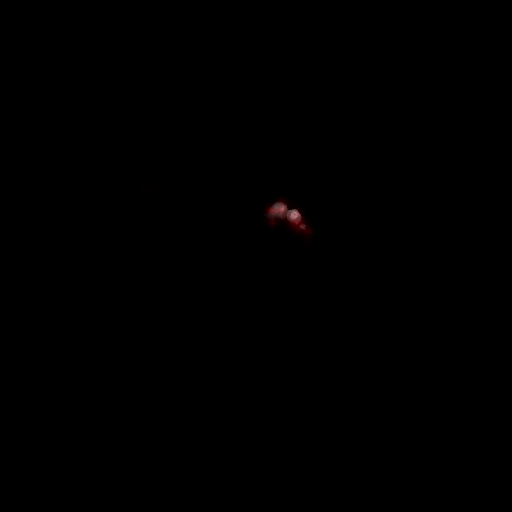
[im 79/395]
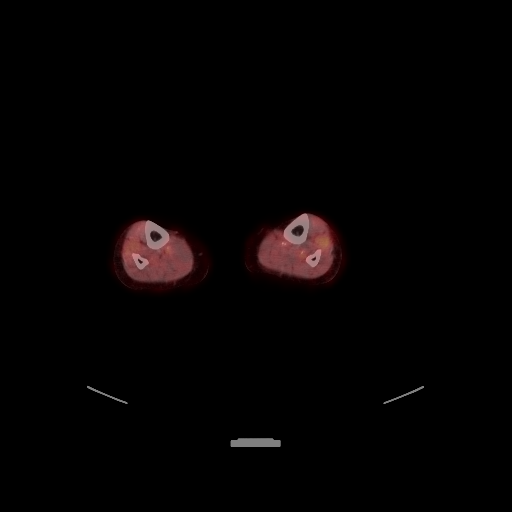
[im 158/395]
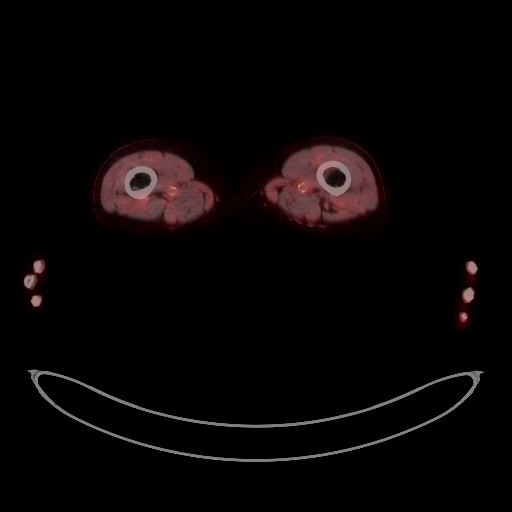
[im 237/395]
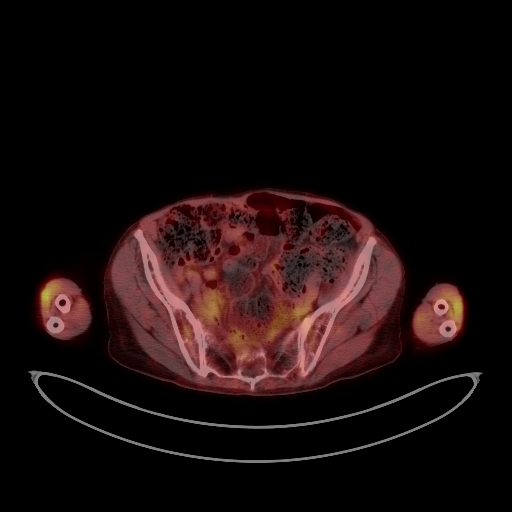
[im 316/395]
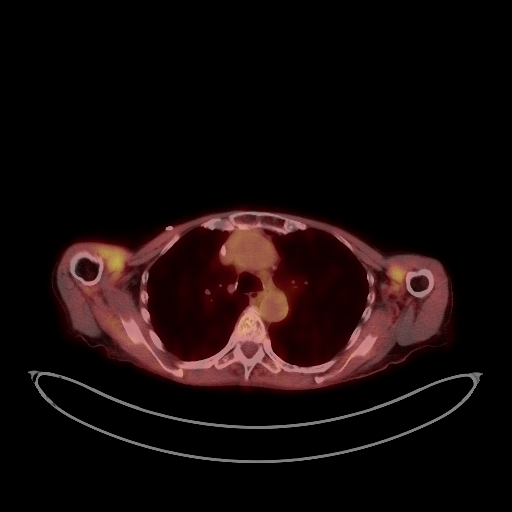
[im 395/395]
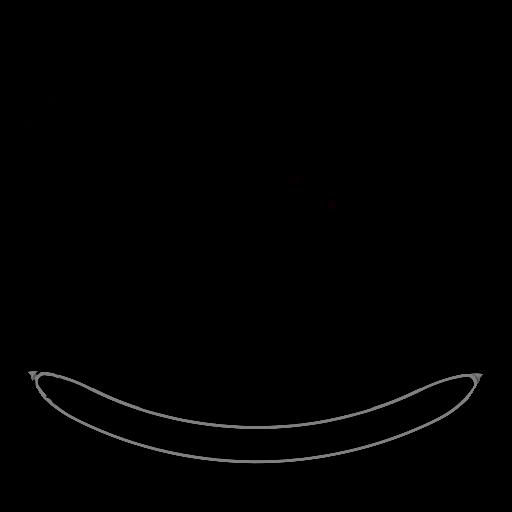

[25 of 25 positions shown; findings below may reference images not displayed]

FINDINGS: Mediastinal blood pool activity: SUV max

HEAD/NECK: No hypermetabolic activity in the scalp. No
hypermetabolic cervical lymph nodes.

Incidental CT findings: none

CHEST: No hypermetabolic mediastinal or hilar nodes. No suspicious
pulmonary nodules on the CT scan.

Incidental CT findings: Port in the anterior chest wall with tip in
distal SVC.

ABDOMEN/PELVIS: No abnormal hypermetabolic activity within the
liver, pancreas, adrenal glands, or spleen. No hypermetabolic lymph
nodes in the abdomen or pelvis.

Incidental CT findings: Large volume stool throughout the colon.
Large volume stool in the rectum.

SKELETON: No focal metabolic activity the skeleton suggest active
multiple myeloma. Resolution metabolic activity in the midshaft
RIGHT femur. Resolution activity associated with the RIGHT sacral
ala.

Lytic lesion noted without radiotracer

Multiple sclerotic lesions throughout the spine unchanged. Several
activity persists example in the RIGHT parasymphyseal ischium (185/4

Incidental CT findings: none

EXTREMITIES: No evidence of active multiple myeloma.

Incidental CT findings: none
IMPRESSION: 1. No FDG evidence of active multiple myeloma on whole-body FDG PET
scan.
2. No soft tissue plasmacytoma.
3. Mixed lytic and sclerotic changes in the skeleton not progressed
from comparison exam.

## 2022-11-18 MED ORDER — SENNOSIDES-DOCUSATE SODIUM 8.6-50 MG PO TABS: 2.0000 | ORAL_TABLET | Freq: Every day | ORAL | 0 refills | Status: DC

## 2022-11-24 ENCOUNTER — Inpatient Hospital Stay: Payer: Medicare HMO | Admitting: Hematology

## 2022-11-24 ENCOUNTER — Inpatient Hospital Stay: Payer: Medicare HMO

## 2022-11-24 ENCOUNTER — Inpatient Hospital Stay: Payer: Medicare HMO | Attending: Hematology

## 2022-11-24 ENCOUNTER — Other Ambulatory Visit: Payer: Self-pay

## 2022-11-24 VITALS — BP 151/82 | HR 69 | Temp 98.7°F | Resp 18

## 2022-11-24 DIAGNOSIS — C9002 Multiple myeloma in relapse: Secondary | ICD-10-CM

## 2022-11-24 DIAGNOSIS — E079 Disorder of thyroid, unspecified: Secondary | ICD-10-CM | POA: Diagnosis not present

## 2022-11-24 DIAGNOSIS — Z5112 Encounter for antineoplastic immunotherapy: Secondary | ICD-10-CM | POA: Diagnosis present

## 2022-11-24 DIAGNOSIS — Z7189 Other specified counseling: Secondary | ICD-10-CM

## 2022-11-24 DIAGNOSIS — Z79899 Other long term (current) drug therapy: Secondary | ICD-10-CM | POA: Diagnosis not present

## 2022-11-24 DIAGNOSIS — I10 Essential (primary) hypertension: Secondary | ICD-10-CM | POA: Diagnosis not present

## 2022-11-24 DIAGNOSIS — M8580 Other specified disorders of bone density and structure, unspecified site: Secondary | ICD-10-CM | POA: Insufficient documentation

## 2022-11-24 DIAGNOSIS — Z9221 Personal history of antineoplastic chemotherapy: Secondary | ICD-10-CM | POA: Insufficient documentation

## 2022-11-24 DIAGNOSIS — G893 Neoplasm related pain (acute) (chronic): Secondary | ICD-10-CM | POA: Diagnosis not present

## 2022-11-24 LAB — CBC WITH DIFFERENTIAL (CANCER CENTER ONLY)
Abs Immature Granulocytes: 0.01 10*3/uL (ref 0.00–0.07)
Basophils Absolute: 0.1 10*3/uL (ref 0.0–0.1)
Basophils Relative: 5 %
Eosinophils Absolute: 0.1 10*3/uL (ref 0.0–0.5)
Eosinophils Relative: 4 %
HCT: 34.3 % — ABNORMAL LOW (ref 36.0–46.0)
Hemoglobin: 11.7 g/dL — ABNORMAL LOW (ref 12.0–15.0)
Immature Granulocytes: 0 %
Lymphocytes Relative: 14 %
Lymphs Abs: 0.4 10*3/uL — ABNORMAL LOW (ref 0.7–4.0)
MCH: 33.7 pg (ref 26.0–34.0)
MCHC: 34.1 g/dL (ref 30.0–36.0)
MCV: 98.8 fL (ref 80.0–100.0)
Monocytes Absolute: 0.6 10*3/uL (ref 0.1–1.0)
Monocytes Relative: 22 %
Neutro Abs: 1.4 10*3/uL — ABNORMAL LOW (ref 1.7–7.7)
Neutrophils Relative %: 55 %
Platelet Count: 290 10*3/uL (ref 150–400)
RBC: 3.47 MIL/uL — ABNORMAL LOW (ref 3.87–5.11)
RDW: 14.6 % (ref 11.5–15.5)
WBC Count: 2.5 10*3/uL — ABNORMAL LOW (ref 4.0–10.5)
nRBC: 0 % (ref 0.0–0.2)

## 2022-11-24 LAB — CMP (CANCER CENTER ONLY)
ALT: 11 U/L (ref 0–44)
AST: 12 U/L — ABNORMAL LOW (ref 15–41)
Albumin: 4.1 g/dL (ref 3.5–5.0)
Alkaline Phosphatase: 30 U/L — ABNORMAL LOW (ref 38–126)
Anion gap: 6 (ref 5–15)
BUN: 16 mg/dL (ref 8–23)
CO2: 27 mmol/L (ref 22–32)
Calcium: 8.9 mg/dL (ref 8.9–10.3)
Chloride: 106 mmol/L (ref 98–111)
Creatinine: 0.6 mg/dL (ref 0.44–1.00)
GFR, Estimated: >60 mL/min (ref >60)
Glucose, Bld: 119 mg/dL — ABNORMAL HIGH (ref 70–99)
Potassium: 4.4 mmol/L (ref 3.5–5.1)
Sodium: 139 mmol/L (ref 135–145)
Total Bilirubin: 0.4 mg/dL (ref 0.3–1.2)
Total Protein: 6.2 g/dL — ABNORMAL LOW (ref 6.5–8.1)

## 2022-11-24 MED ORDER — SODIUM CHLORIDE 0.9 % IV SOLN: Freq: Once | INTRAVENOUS | Status: AC

## 2022-11-24 MED ORDER — SODIUM CHLORIDE 0.9 % IV SOLN
16.3000 mg/kg | Freq: Once | INTRAVENOUS | Status: AC
  Administered 2022-11-24: 800 mg via INTRAVENOUS
  Filled 2022-11-24: qty 40

## 2022-11-24 MED ORDER — HEPARIN SOD (PORK) LOCK FLUSH 100 UNIT/ML IV SOLN
500.0000 [IU] | Freq: Once | INTRAVENOUS | Status: AC | PRN
  Administered 2022-11-24: 500 [IU]

## 2022-11-24 MED ORDER — ACETAMINOPHEN 325 MG PO TABS
650.0000 mg | ORAL_TABLET | Freq: Once | ORAL | Status: AC
  Administered 2022-11-24: 650 mg via ORAL
  Filled 2022-11-24: qty 2

## 2022-11-24 MED ORDER — SODIUM CHLORIDE 0.9% FLUSH
10.0000 mL | INTRAVENOUS | Status: DC | PRN
  Administered 2022-11-24: 10 mL

## 2022-11-24 MED ORDER — FAMOTIDINE IN NACL 20-0.9 MG/50ML-% IV SOLN
20.0000 mg | Freq: Once | INTRAVENOUS | Status: AC
  Administered 2022-11-24: 20 mg via INTRAVENOUS
  Filled 2022-11-24: qty 50

## 2022-11-24 MED ORDER — HYDROXYZINE HCL 25 MG PO TABS
25.0000 mg | ORAL_TABLET | Freq: Once | ORAL | Status: AC
  Administered 2022-11-24: 25 mg via ORAL
  Filled 2022-11-24: qty 1

## 2022-11-24 NOTE — Progress Notes (Signed)
[place]   [phone]) [phone] [phone]   Clinic Follow up Note   Patient Care Team: [name], MD as PCP - General (Family Medicine) [name], MD as Consulting Physician (Hematology) Bond, [name], MD as Referring Physician (Ophthalmology) [name], [name], MD as Consulting Physician (Hematology and Oncology) [name], MD as Consulting Physician (Internal Medicine) [name], MD as Consulting Physician (General Surgery) [name], [facility] as Pharmacist (Pharmacist)  Date of Service: 11/24/2022  CHIEF COMPLAINT:  Follow-up for continuation and management of multiple myeloma   SUMMARY OF ONCOLOGIC HISTORY: Oncology History  Multiple myeloma not having achieved remission ([place])  12/12/2018 Initial Diagnosis   Multiple myeloma not having achieved remission ([place])   12/20/2018 - 10/04/2019 Chemotherapy   The patient had dexamethasone (DECADRON) tablet 20 mg, 20 mg (100 % of original dose 20 mg), Oral, Once, 14 of 14 cycles Dose modification: 20 mg (original dose 20 mg, Cycle 1), 10 mg (original dose 20 mg, Cycle 14) Administration: 20 mg (12/20/2018), 20 mg (12/27/2018), 20 mg (01/10/2019), 20 mg (01/17/2019), 20 mg (01/31/2019), 20 mg (02/07/2019), 20 mg (03/14/2019), 20 mg (03/21/2019), 20 mg (02/21/2019), 20 mg (02/28/2019), 20 mg (04/04/2019), 20 mg (04/11/2019), 20 mg (04/25/2019), 20 mg (05/02/2019), 20 mg (05/16/2019), 20 mg (05/23/2019), 20 mg (06/06/2019), 20 mg (06/13/2019), 20 mg (06/27/2019), 20 mg (07/04/2019), 20 mg (07/18/2019), 20 mg (07/25/2019), 20 mg (08/08/2019), 20 mg (08/15/2019), 20 mg (08/29/2019), 20 mg (09/05/2019), 10 mg (09/19/2019) lenalidomide (REVLIMID) 15 MG capsule, 1 of 1 cycle, Start date: 01/18/2019, End date: 02/21/2019 bortezomib SQ (VELCADE) chemo injection 2 mg, 1.3 mg/m2 = 2 mg, Subcutaneous,  Once, 14 of 14 cycles Administration: 2 mg (12/20/2018), 2 mg (12/27/2018), 2 mg (12/23/2018), 2 mg (12/30/2018),  2 mg (01/10/2019), 2 mg (01/17/2019), 2 mg (01/13/2019), 2 mg (01/20/2019), 2 mg (01/31/2019), 2 mg (02/07/2019), 2 mg (02/03/2019), 2 mg (02/10/2019), 2 mg (03/14/2019), 2 mg (03/17/2019), 2 mg (03/21/2019), 2 mg (03/24/2019), 2 mg (02/21/2019), 2 mg (02/24/2019), 2 mg (02/28/2019), 2 mg (03/03/2019), 2 mg (04/04/2019), 2 mg (04/06/2019), 2 mg (04/11/2019), 2 mg (04/14/2019), 2 mg (04/25/2019), 2 mg (04/28/2019), 2 mg (05/02/2019), 2 mg (05/05/2019), 2 mg (05/16/2019), 2 mg (05/19/2019), 2 mg (05/23/2019), 2 mg (05/26/2019), 2 mg (06/06/2019), 2 mg (06/09/2019), 2 mg (06/13/2019), 2 mg (06/16/2019), 2 mg (06/27/2019), 2 mg (06/30/2019), 2 mg (07/04/2019), 2 mg (07/07/2019), 2 mg (07/18/2019), 2 mg (07/21/2019), 2 mg (07/25/2019), 2 mg (07/28/2019), 2 mg (08/08/2019), 2 mg (08/11/2019), 2 mg (08/15/2019), 2 mg (08/18/2019), 2 mg (08/29/2019), 2 mg (09/01/2019), 2 mg (09/05/2019), 2 mg (09/08/2019), 2 mg (09/19/2019), 2 mg (10/04/2019)  for chemotherapy treatment.    Multiple myeloma in relapse ([place])  04/16/2021 Initial Diagnosis   Multiple myeloma in relapse ([place])   04/30/2021 - 04/15/2022 Chemotherapy   Patient is on Treatment Plan : MYELOMA RELAPSED/REFRACTORY KCd q28d     05/12/2022 - 07/08/2022 Chemotherapy   Patient is on Treatment Plan : MYELOMA Daratumumab + Pomalidomide + Dexamethasone q28d x 7 cycles     05/22/2022 -  Chemotherapy   Patient is on Treatment Plan : MYELOMA Daratumumab IV + Pomalidomide + Dexamethasone q28d x 7 cycles       CURRENT THERAPY: Dara/Pom/Dex  INTERVAL HISTORY:  Megan Olson is a 82 y.o. female returns for continued evaluation and management of multiple myeloma. He is here to start cycle 8 of her treatment.   Patient was last seen by me on 10/27/2022 and  she complained of minor skin spots and chronic cough.   Patient reports she has been doing well overall without any new medical concerns. She notes she took Venetoclax today before treatment.   Patient notes she has been tolerating Pomalidomide 2 mg well  without any severe toxicities. She does complain of mild right muscle cramps.   She denies fever, chills, night sweats, infection issues, new lumps/bumps, abdominal pain, chest pain, back pain, or leg swelling. Patient does complains of mild dark skin rash near her left face.   She notes she tolerated her last treatment well without any new or severe toxicities.    MEDICAL HISTORY:  Past Medical History:  Diagnosis Date   Anemia    Glaucoma    HYPERTENSION 03/11/2007   Multiple myeloma ([place])    OSTEOPENIA 03/11/2007   Pre-diabetes    Thyroid cancer ([place])    Thyroid disease    Tubular adenoma of colon 07/2015    SURGICAL HISTORY: Past Surgical History:  Procedure Laterality Date   BONE MARROW BIOPSY     multiple   BREAST EXCISIONAL BIOPSY Right 2000   BREAST LUMPECTOMY  1990   benign   CATARACT EXTRACTION Bilateral 2018   DILATION AND CURETTAGE OF UTERUS     bleeding at menopause. No uterine cancer   IR IMAGING GUIDED PORT INSERTION  05/16/2021   IR RADIOLOGIST EVAL & MGMT  12/13/2018   THYROIDECTOMY N/A 08/02/2020   Procedure: TOTAL THYROIDECTOMY;  Surgeon: [name], MD;  Location: [facility]S;  Service: General;  Laterality: N/A;   TONSILLECTOMY     age 16   I have reviewed the social history and family history with the patient and they are unchanged from previous note.  ALLERGIES:  is allergic to ace inhibitors, benadryl [diphenhydramine], diamox [acetazolamide], sulfamethoxazole, lenalidomide, and penicillins.  MEDICATIONS:  Current Outpatient Medications  Medication Sig Dispense Refill   acyclovir (ZOVIRAX) 400 MG tablet Take 1 tablet (400 mg total) by mouth 2 (two) times daily. 60 tablet 3   ALPHAGAN P 0.1 % SOLN Place 1 drop into both eyes in the morning, at noon, and at bedtime.      amLODipine (NORVASC) 10 MG tablet Take 1 tablet (10 mg total) by mouth daily. 90 tablet 3   aspirin EC 81 MG tablet Take 81 mg by mouth daily.     Cholecalciferol (VITAMIN D3) 125 MCG  (5000 UT) TABS Take 5,000 Units by mouth daily.     Co-Enzyme Q-10 100 MG CAPS Take 100 mg by mouth daily.     dexamethasone (DECADRON) 4 MG tablet Take 4 tablets (16 mg total) by mouth as directed. Take one hour prior to monthly Darzalex infusion. 12 tablet 3   dorzolamide-timolol (COSOPT) 22.3-6.8 MG/ML ophthalmic solution Place 1 drop into both eyes 2 (two) times daily.   11   fentaNYL (DURAGESIC) 12 MCG/HR Place 1 patch onto the skin every 3 (three) days. 10 patch 0   levothyroxine (SYNTHROID) 88 MCG tablet Take 1 tablet (88 mcg total) by mouth daily before breakfast. 90 tablet 3   lidocaine-prilocaine (EMLA) cream Apply to affected area once 30 g 3   LUMIGAN 0.01 % SOLN 1 drop at bedtime. (Patient not taking: Reported on 07/08/2022)     magnesium chloride (SLOW-MAG) 64 MG TBEC SR tablet Take by mouth.     Multiple Vitamins-Minerals (MULTIVITAMIN WITH MINERALS) tablet Take 1 tablet by mouth daily. Centrum Silver 50 +     Netarsudil-Latanoprost (ROCKLATAN) 0.02-0.005 % SOLN Place  1 drop into both eyes daily.     Omega-3 1000 MG CAPS Take 1,000 mg by mouth daily.      ondansetron (ZOFRAN) 8 MG tablet Take 1 tablet (8 mg total) by mouth every 8 (eight) hours as needed for nausea or vomiting. 30 tablet 1   oxyCODONE-acetaminophen (PERCOCET) 5-325 MG tablet Take 1-2 tablets by mouth every 4 (four) hours as needed for moderate pain or severe pain. 90 tablet 0   polyethylene glycol (MIRALAX) packet Take 17 g by mouth daily. 30 each 1   pomalidomide (POMALYST) 2 MG capsule Take 1 capsule (2 mg total) by mouth daily. Take for 21 days on, 7 days off, repeat every 28 days 21 capsule 0   prochlorperazine (COMPAZINE) 10 MG tablet Take 1 tablet (10 mg total) by mouth every 6 (six) hours as needed for nausea or vomiting. 30 tablet 1   senna-docusate (SENOKOT-S) 8.6-50 MG tablet Take 2 tablets by mouth at bedtime. 60 tablet 0   Simethicone (GAS-X PO) Take 1 tablet by mouth as needed.     vitamin C (ASCORBIC  ACID) 500 MG tablet Take 500 mg by mouth 2 (two) times a week.      No current facility-administered medications for this visit.    PHYSICAL EXAMINATION:. .There were no vitals taken for this visit.  GENERAL:alert, in no acute distress and comfortable SKIN: no acute rashes, no significant lesions EYES: conjunctiva are pink and non-injected, sclera anicteric OROPHARYNX: MMM, no exudates, no oropharyngeal erythema or ulceration NECK: supple, no JVD LYMPH:  no palpable lymphadenopathy in the cervical, axillary or inguinal regions LUNGS: clear to auscultation b/l with normal respiratory effort HEART: regular rate & rhythm ABDOMEN:  normoactive bowel sounds , non tender, not distended. Extremity: no pedal edema PSYCH: alert & oriented x 3 with fluent speech NEURO: no focal motor/sensory deficits   LABORATORY DATA:   .    Latest Ref Rng & Units 11/24/2022    9:28 AM 10/27/2022    9:06 AM 10/13/2022    8:40 AM  CBC  WBC 4.0 - 10.5 K/uL 2.5  3.9  5.5   Hemoglobin 12.0 - 15.0 g/dL 11.7  11.3  11.3   Hematocrit 36.0 - 46.0 % 34.3  33.9  33.1   Platelets 150 - 400 K/uL 290  316  201       Latest Ref Rng & Units 11/24/2022    9:28 AM 10/27/2022    9:06 AM 10/13/2022    8:40 AM  CMP  Glucose 70 - 99 mg/dL 119  87  84   BUN 8 - 23 mg/dL '16  18  15   '$ Creatinine 0.44 - 1.00 mg/dL 0.60  0.64  0.60   Sodium 135 - 145 mmol/L 139  138  140   Potassium 3.5 - 5.1 mmol/L 4.4  4.3  4.5   Chloride 98 - 111 mmol/L 106  106  107   CO2 22 - 32 mmol/L '27  28  28   '$ Calcium 8.9 - 10.3 mg/dL 8.9  9.1  9.1   Total Protein 6.5 - 8.1 g/dL 6.2  6.0  6.3   Total Bilirubin 0.3 - 1.2 mg/dL 0.4  0.4  0.4   Alkaline Phos 38 - 126 U/L 30  33  26   AST 15 - 41 U/L '12  12  12   '$ ALT 0 - 44 U/L '11  12  13       '$ RADIOGRAPHIC STUDIES: I have personally  reviewed the radiological images as listed and agreed with the findings in the report. No results found.   ASSESSMENT & PLAN:   Megan Olson is a 82 y.o.  female who presents for a follow up for multiple myeloma.   1. 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  She achieved complete response with negative M protein and negative PET scan in December 2020. -She subsequently went on Ninlaro maintenance therapy -PET scan from May 08, 2021, which showed focal activity within the right femur, consistent with active multiple myeloma, no other hypermetabolic lesion or plasmacytoma. -05/06/2021 bone marrow biopsy, which showed 3% plasma cells. -Recommend Zometa every 2 months. -Last myeloma labs from 06/30/22 show her M protein is down to 0.6. Treatment orders reviewed and scanned  2. Mild anemia -We will continue to monitor with myeloma treatment and current medication  3.  Cancer related pain  -Currently well controlled -Continue fentanyl patch at 12 mcg/h and Percocet for break through pain.    PLAN: -Discussed lab results from today, 11/24/2022, with the patient. CBC shows slight decreases WBC of 2.5, hemoglobin of 11.7, and hematocrit of 34.3. CMP is pending. Myeloma panel sent today.  -Discussed myeloma panel from 10/27/2022 which showed slightly elevated M-spike from previous panel. It increased from 0.3 to 0.5.  -Discussed that if M-spike increases, then we will have to go back to twice a month for Dara. -Patient has been tolerating Pomalidomide 2 mg without any severe toxicities.  -Continue Pomalidomide 2 mg as prescribed.  -No new significant toxicities from a current plan of treatment with Dara/Pom/Dex at this time. -Patient can proceed with cycle 8 of her current Dara/Pom/Dex without any dose modifications.  -Answered all of patient's questions.  -Continue Zometa every 8 weeks. No new dental issues.   FOLLOW-UP: Per integrated scheduling  The total time spent in  the appointment was 30 minutes* .  All of the patient's questions were answered with apparent satisfaction. The patient knows to call the clinic with any problems, questions or concerns.   Sullivan Lone MD MS AAHIVMS [facility] Hematology/Oncology Physician [facility]  .*Total Encounter Time as defined by the Centers for Medicare and Medicaid Services includes, in addition to the face-to-face time of a patient visit (documented in the note above) non-face-to-face time: obtaining and reviewing outside history, ordering and reviewing medications, tests or procedures, care coordination (communications with other health care professionals or caregivers) and documentation in the medical record.   I, Cleda Mccreedy, am acting as a Education administrator for Sullivan Lone, MD. .I have reviewed the above documentation for accuracy and completeness, and I agree with the above. [name] MD

## 2022-11-24 NOTE — Progress Notes (Signed)
Pt took16 mg Dexamethasone at home this morning

## 2022-11-24 NOTE — Progress Notes (Signed)
Patient seen by MD today  Vitals are within treatment parameters./ Dr Irene Limbo aware BP is 157/81  Labs reviewed: and are not all within treatment parameters. Dr Irene Limbo aware ANC= 1.4  Per physician team, patient is ready for treatment and there are NO modifications to the treatment plan.

## 2022-11-24 NOTE — Patient Instructions (Signed)
[facility]  Discharge Instructions: Thank you for choosing [facility] to provide your oncology and hematology care.   If you have a lab appointment with the [facility], please go directly to the [facility] and check in at the registration area.   Wear comfortable clothing and clothing appropriate for easy access to any Portacath or PICC line.   We strive to give you quality time with your provider. You may need to reschedule your appointment if you arrive late (15 or more minutes).  Arriving late affects you and other patients whose appointments are after yours.  Also, if you miss three or more appointments without notifying the office, you may be dismissed from the clinic at the provider's discretion.      For prescription refill requests, have your pharmacy contact our office and allow 72 hours for refills to be completed.    Today you received the following chemotherapy and/or immunotherapy agents: daratumumab     To help prevent nausea and vomiting after your treatment, we encourage you to take your nausea medication as directed.  BELOW ARE SYMPTOMS THAT SHOULD BE REPORTED IMMEDIATELY: *FEVER GREATER THAN 100.4 F (38 C) OR HIGHER *CHILLS OR SWEATING *NAUSEA AND VOMITING THAT IS NOT CONTROLLED WITH YOUR NAUSEA MEDICATION *UNUSUAL SHORTNESS OF BREATH *UNUSUAL BRUISING OR BLEEDING *URINARY PROBLEMS (pain or burning when urinating, or frequent urination) *BOWEL PROBLEMS (unusual diarrhea, constipation, pain near the anus) TENDERNESS IN MOUTH AND THROAT WITH OR WITHOUT PRESENCE OF ULCERS (sore throat, sores in mouth, or a toothache) UNUSUAL RASH, SWELLING OR PAIN  UNUSUAL VAGINAL DISCHARGE OR ITCHING   Items with * indicate a potential emergency and should be followed up as soon as possible or go to the Emergency Department if any problems should occur.  Please show the CHEMOTHERAPY ALERT CARD or IMMUNOTHERAPY ALERT CARD at  check-in to the Emergency Department and triage nurse.  Should you have questions after your visit or need to cancel or reschedule your appointment, please contact [facility]  Dept: [phone]  and follow the prompts.  Office hours are 8:00 a.m. to 4:30 p.m. Monday - Friday. Please note that voicemails left after 4:00 p.m. may not be returned until the following business day.  We are closed weekends and major holidays. You have access to a nurse at all times for urgent questions. Please call the main number to the clinic Dept: [phone] and follow the prompts.   For any non-urgent questions, you may also contact your provider using MyChart. We now offer e-Visits for anyone 13 and older to request care online for non-urgent symptoms. For details visit [email or web address].   Also download the MyChart app! Go to the app store, search "MyChart", open the app, select [facility], and log in with your MyChart username and password.  Masks are optional in the cancer centers. If you would like for your care team to wear a mask while they are taking care of you, please let them know. You may have one support person who is at least 82 years old accompany you for your appointments.

## 2022-11-27 ENCOUNTER — Other Ambulatory Visit: Payer: Self-pay

## 2022-11-27 DIAGNOSIS — Z7189 Other specified counseling: Secondary | ICD-10-CM

## 2022-11-27 DIAGNOSIS — C9002 Multiple myeloma in relapse: Secondary | ICD-10-CM

## 2022-11-27 MED ORDER — ACYCLOVIR 400 MG PO TABS: 400.0000 mg | ORAL_TABLET | Freq: Two times a day (BID) | ORAL | 3 refills | Status: DC

## 2022-11-30 ENCOUNTER — Encounter: Payer: Self-pay | Admitting: Hematology

## 2022-11-30 LAB — MULTIPLE MYELOMA PANEL, SERUM
Albumin SerPl Elph-Mcnc: 3.5 g/dL (ref 2.9–4.4)
Albumin/Glob SerPl: 1.6 (ref 0.7–1.7)
Alpha 1: 0.2 g/dL (ref 0.0–0.4)
Alpha2 Glob SerPl Elph-Mcnc: 0.6 g/dL (ref 0.4–1.0)
B-Globulin SerPl Elph-Mcnc: 1.2 g/dL (ref 0.7–1.3)
Gamma Glob SerPl Elph-Mcnc: 0.2 g/dL — ABNORMAL LOW (ref 0.4–1.8)
Globulin, Total: 2.2 g/dL (ref 2.2–3.9)
IgA: 23 mg/dL — ABNORMAL LOW (ref 64–422)
IgG (Immunoglobin G), Serum: 797 mg/dL (ref 586–1602)
IgM (Immunoglobulin M), Srm: 8 mg/dL — ABNORMAL LOW (ref 26–217)
M Protein SerPl Elph-Mcnc: 0.4 g/dL — ABNORMAL HIGH
Total Protein ELP: 5.7 g/dL — ABNORMAL LOW (ref 6.0–8.5)

## 2022-12-07 ENCOUNTER — Ambulatory Visit: Payer: Medicare HMO | Admitting: Pharmacist

## 2022-12-07 ENCOUNTER — Other Ambulatory Visit (HOSPITAL_COMMUNITY): Payer: Self-pay

## 2022-12-07 NOTE — Progress Notes (Signed)
Care Management & Coordination Services Pharmacy Note  12/07/2022 Name:  Megan Olson MRN:  [id number] DOB:  1941/06/09  Summary: Daryll Brod visit with PharmD.  Doing well from pain perspective and denies any constipation.  BP at home well controlled with no symptoms.  Home readings - 122/76 P 54, 112/71, 129/77.     Recommendations/Changes made from today's visit: DUE for AWV  Follow up plan: FU 1 year   Subjective: Megan Olson is an 82 y.o. year old female who is a primary patient of Yong Channel, Brayton Mars, MD.  The care coordination team was consulted for assistance with disease management and care coordination needs.    Engaged with patient by telephone for follow up visit.  Recent office visits:  None   Recent consult visits:  09/01/2022 OV (Oncology) Toma Copier, MD; Reduce post treatment Dexamethasone to '4mg'$  after next treatment.    08/04/2022 OV (Oncology) Toma Copier, MD; Reduce Dexamethasone to '8mg'$  after next treatment    07/25/2022 OV (Ophthalmology) Edilia Bo, [name], MD;  Alphagan P x3 OU  Dorzolamide-Timolol x2 OU Continue Rocklatan x1 OU qhs Recommend ATs prn  Reviewed proper drop instillation technique    07/08/2022 OV (Oncology) Toma Copier, MD; no medication changes   Hospital visits:  None in previous 6 months   Objective:  Lab Results  Component Value Date   CREATININE 0.60 11/24/2022   BUN 16 11/24/2022   GFR 83.73 11/28/2018   GFRNONAA >60 11/24/2022   GFRAA >60 06/13/2020   NA 139 11/24/2022   K 4.4 11/24/2022   CALCIUM 8.9 11/24/2022   CO2 27 11/24/2022   GLUCOSE 119 (H) 11/24/2022    Lab Results  Component Value Date/Time   HGBA1C 5.0 01/28/2022 09:26 AM   HGBA1C 5.9 (A) 01/13/2021 09:26 AM   HGBA1C 5.9 (A) 06/18/2020 01:00 PM   HGBA1C 5.8 (H) 01/26/2020 04:15 PM   GFR 83.73 11/28/2018 03:48 PM   GFR 89.15 04/04/2018 08:49 AM    Last diabetic Eye exam: No results found for: "HMDIABEYEEXA"  Last diabetic Foot  exam: No results found for: "HMDIABFOOTEX"   Lab Results  Component Value Date   CHOL 220 (H) 01/26/2020   HDL 68 01/26/2020   LDLCALC 132 (H) 01/26/2020   TRIG 97 01/26/2020   CHOLHDL 3.2 01/26/2020       Latest Ref Rng & Units 11/24/2022    9:28 AM 10/27/2022    9:06 AM 10/13/2022    8:40 AM  Hepatic Function  Total Protein 6.5 - 8.1 g/dL 6.2  6.0  6.3   Albumin 3.5 - 5.0 g/dL 4.1  3.9  4.0   AST 15 - 41 U/L '12  12  12   '$ ALT 0 - 44 U/L '11  12  13   '$ Alk Phosphatase 38 - 126 U/L 30  33  26   Total Bilirubin 0.3 - 1.2 mg/dL 0.4  0.4  0.4     Lab Results  Component Value Date/Time   TSH 1.50 01/28/2022 09:26 AM   TSH 2.74 08/11/2021 09:28 AM   FREET4 0.95 01/28/2022 09:26 AM   FREET4 0.77 06/30/2021 12:20 PM       Latest Ref Rng & Units 11/24/2022    9:28 AM 10/27/2022    9:06 AM 10/13/2022    8:40 AM  CBC  WBC 4.0 - 10.5 K/uL 2.5  3.9  5.5   Hemoglobin 12.0 - 15.0 g/dL 11.7  11.3  11.3   Hematocrit 36.0 -  46.0 % 34.3  33.9  33.1   Platelets 150 - 400 K/uL 290  316  201     Lab Results  Component Value Date/Time   VD25OH 71.61 07/02/2021 09:11 AM   VD25OH 60.48 06/13/2020 11:12 AM   VITAMINB12 243 02/04/2022 08:52 AM   VITAMINB12 426 01/26/2020 04:15 PM    Clinical ASCVD: No  The ASCVD Risk score (Arnett DK, et al., 2019) failed to calculate for the following reasons:   The 2019 ASCVD risk score is only valid for ages 74 to 35        10/28/2021    8:55 AM 01/27/2021    2:34 PM 09/18/2019   11:29 AM  Depression screen PHQ 2/9  Decreased Interest 0 0 0  Down, Depressed, Hopeless 0 0 0  PHQ - 2 Score 0 0 0     Social History   Tobacco Use  Smoking Status Former   Packs/day: 0.50   Years: 7.00   Total pack years: 3.50   Types: Cigarettes   Quit date: 01/04/1961   Years since quitting: 61.9  Smokeless Tobacco Never   BP Readings from Last 3 Encounters:  11/24/22 (!) 151/82  11/24/22 (!) 157/81  10/27/22 (!) 142/70   Pulse Readings from Last 3  Encounters:  11/24/22 69  11/24/22 68  10/27/22 62   Wt Readings from Last 3 Encounters:  11/24/22 108 lb 12.8 oz (49.4 kg)  10/27/22 108 lb 3.2 oz (49.1 kg)  10/13/22 108 lb (49 kg)   BMI Readings from Last 3 Encounters:  11/24/22 21.25 kg/m  10/27/22 21.13 kg/m  10/13/22 21.09 kg/m    Allergies  Allergen Reactions   Ace Inhibitors Cough   Benadryl [Diphenhydramine] Other (See Comments)    Heart races   Diamox [Acetazolamide]     Hypotensive event at ophthalmologist   Sulfamethoxazole     REACTION: rash   Lenalidomide Rash   Penicillins Rash    REACTION: rash Did it involve swelling of the face/tongue/throat, SOB, or low BP? Yes Did it involve sudden or severe rash/hives, skin peeling, or any reaction on the inside of your mouth or nose? No Did you need to seek medical attention at a hospital or doctor's office? No When did it last happen?      more than 10 years ago If all above answers are "NO", may proceed with cephalosporin use.     Medications Reviewed Today     Reviewed by Shawn Stall, RN (Registered Nurse) on 10/27/22 at [id number]  Med List Status: <None>   Medication Order Taking? Sig Documenting Provider Last Dose Status Informant  acyclovir (ZOVIRAX) 400 MG tablet [id number] Yes Take 1 tablet (400 mg total) by mouth 2 (two) times daily. [name], MD Taking Active   ALPHAGAN P 0.1 % Bailey Mech [id number] Yes Place 1 drop into both eyes in the morning, at noon, and at bedtime.  [provider] Taking Active Self  amLODipine (NORVASC) 10 MG tablet [id number] Yes Take 1 tablet (10 mg total) by mouth daily. [name], MD Taking Active   aspirin EC 81 MG tablet [id number] Yes Take 81 mg by mouth daily. [provider] Taking Active Self  Cholecalciferol (VITAMIN D3) 125 MCG (5000 UT) TABS [id number] Yes Take 5,000 Units by mouth daily. [provider] Taking Active Self  Co-Enzyme Q-10 100 MG CAPS [id number] Yes Take 100 mg by mouth  daily. [provider] Taking Active Self  dexamethasone (DECADRON) 4 MG tablet  [id number] Yes Take 5 tablets (20 mg total) by mouth daily. Take the day after daratumumab. Take with breakfast [name], MD Taking Active            Med Note ([place] Jun 16, 2022  8:58 AM) Decadron 16 mg for now and provider will reevaluate at next visit.  dorzolamide-timolol (COSOPT) 22.3-6.8 MG/ML ophthalmic solution [id number] Yes Place 1 drop into both eyes 2 (two) times daily.  [provider] Taking Active Self  fentaNYL ([place]) 12 MCG/HR [id number] Yes Place 1 patch onto the skin every 3 (three) days. [name], MD Taking Active   levothyroxine (SYNTHROID) 88 MCG tablet [id number] Yes Take 1 tablet (88 mcg total) by mouth daily before breakfast. [name], MD Taking Active   lidocaine-prilocaine (EMLA) cream [id number] Yes Apply to affected area once [name], MD Taking Active   LUMIGAN 0.01 % SOLN [id number] No 1 drop at bedtime.  Patient not taking: Reported on 07/08/2022   [provider] Not Taking Active   magnesium chloride (SLOW-MAG) 64 MG TBEC SR tablet [id number] Yes Take by mouth. [provider] Taking Active   Multiple Vitamins-Minerals (MULTIVITAMIN WITH MINERALS) tablet [id number] Yes Take 1 tablet by mouth daily. Centrum Silver 50 + [provider] Taking Active Self  Netarsudil-Latanoprost (ROCKLATAN) 0.02-0.005 % SOLN [id number] Yes Place 1 drop into both eyes daily. [provider] Taking Active   Omega-3 1000 MG CAPS [id number] Yes Take 1,000 mg by mouth daily.  [provider] Taking Active Self  ondansetron (ZOFRAN) 8 MG tablet [id number] Yes Take 1 tablet (8 mg total) by mouth every 8 (eight) hours as needed for nausea or vomiting. [name], MD Taking Active   oxyCODONE-acetaminophen (PERCOCET) 5-325 MG tablet [id number] Yes Take 1-2 tablets by mouth every 4 (four) hours as  needed for moderate pain or severe pain. [name], MD Taking Active   polyethylene glycol [facility]) packet [id number] Yes Take 17 g by mouth daily. [name], MD Taking Active Self  pomalidomide (POMALYST) 2 MG capsule [id number] Yes Take 1 capsule (2 mg total) by mouth daily. Take for 21 days on, 7 days off, repeat every 28 days [name], MD Taking Active   prochlorperazine (COMPAZINE) 10 MG tablet [id number] Yes Take 1 tablet (10 mg total) by mouth every 6 (six) hours as needed for nausea or vomiting. [name], MD Taking Active   senna-docusate (SENOKOT-S) 8.6-50 MG tablet [id number] Yes Take 2 tablets by mouth at bedtime. [name], MD Taking Active   Simethicone (GAS-X PO) [id number] Yes Take 1 tablet by mouth as needed. [provider] Taking Active   vitamin C (ASCORBIC ACID) 500 MG tablet [id number] Yes Take 500 mg by mouth 2 (two) times a week.  [provider] Taking Active Self           Med Note Jesse Fall   Tue Oct 17, 2019  9:01 AM) Taking every other day  Med List Note Britt Boozer, [facility] 05/07/22 1524): Pomalyst filled through [facility]:  (Social Determinants of Health) assessments and interventions performed: Yes Financial Resource Strain: Low Risk  (12/07/2022)   Overall Financial Resource Strain (CARDIA)    Difficulty of Paying Living Expenses: Not very hard   Food Insecurity: No Food Insecurity (12/07/2022)   Hunger Vital Sign    Worried About Running  Out of Food in the Last Year: Never true    Ran Out of Food in the Last Year: Never true    SDOH Interventions    Flowsheet Row Clinical Support from 09/06/2018 in [place]  SDOH Interventions   Depression Interventions/Treatment  Counseling       Medication Assistance: None required.  Patient affirms current coverage meets needs.  Medication Access: Within the past 30 days,  how often has patient missed a dose of medication? 0 Is a pillbox or other method used to improve adherence? Yes  Factors that may affect medication adherence? no barriers identified Are meds synced by current pharmacy? No  Are meds delivered by current pharmacy? No  Does patient experience delays in picking up medications due to transportation concerns? No   [facility] Services Reviewed: Is patient disadvantaged to use [facility]?: Yes  Current Rx insurance plan: [employer] Name and location of Current pharmacy:  [facility] [address] [place] - [place] [address] [place] [address] [place] [address] Phone: [phone] Fax: [phone]  [facility]) [place], [place] [address] Phone: [phone] Fax: [place] [address] Phone: [phone] Fax: [phone]  [facility], [place] - [address] [place] [address] Phone: [phone] Fax: [phone]  [facility] services reviewed with patient today?: Yes  Patient requests to transfer care to [facility]?: No  Reason patient declined to change pharmacies: Disadvantaged due to insurance/mail order  Compliance/Adherence/Medication fill history: Care Gaps: Due for AWV  Star-Rating Drugs: None   Assessment/Plan     Hypertension (BP goal <130/80) 12/07/22 -Controlled -Current treatment: Amlodipine '10mg'$  daily Appropriate, Effective, Safe, Accessible -Medications previously tried: none noted  -Current home readings: 122/76 P 54, 112/71, 129/77 -Current dietary habits: eats a well balanced diet, no eating out -Current exercise habits: walks almost daily weather permitting with husband -Denies hypotensive/hypertensive symptoms -- Her BP continues to be controlled on increased dose of  amlodipine.  No symptoms at home - denies headache or dizziness.  She checks BP at least once per week which is acceptable. No changes needed at this time - continue to monitor as current.   Hyperlipidemia: (LDL goal < 100) -Uncontrolled -Current treatment: None -Medications previously tried: none  -Current dietary patterns: see HTN -Current exercise habits: see HTn -Educated on Cholesterol goals;  Benefits of statin for ASCVD risk reduction; Importance of limiting foods high in cholesterol; -Recommended continue current management, she does not want to take statin medication.  Have not checked lipids since 2021.  Could consider recheck at next OV.  Pre-Diabetes (A1c goal <6.5%) -Controlled -Current medications: None -Medications previously tried: none  -Current home glucose readings fasting glucose: not checking post prandial glucose: not checking -Denies hypoglycemic/hyperglycemic symptoms -Current exercise: walks almost daily with husbadn -Educated on A1c and blood sugar goals; Effect of steroid on blood glucose -Counseled to check feet daily and get yearly eye exams -Recommended continue routine screenings, she is cutting back on the amount of dexamethasone she is taking which should continue to help her A1c decrease.  Osteoporosis (Goal Prevent fracture) -Controlled -Last DEXA Scan: 2014   T-Score lumbar spine: -2.3 -Patient is not a candidate for pharmacologic treatment -Current treatment  Vitamin D 5000 IU tabs daily Appropriate, Effective, Safe, Accessible -Medications previously tried: none noted  -Recommend weight-bearing and muscle strengthening  exercises for building and maintaining bone density. -Recommended to continue current medication Hopes to get back to strength training as she is on maintenance from myeloma  Multiple Myeloma (Goal: Remission) 12/07/22 -Controlled -Current treatment  Fentanyl 64mg/HR Appropriate, Effective, Safe, Accessible Oxycodone  5-'325mg'$  one half tablet 2 to 3 times per day Appropriate, Effective, Safe, Accessible Pomalyst 2 mg 21 days on 7 days off Appropriate, Effective, Safe, Accessible -Medications previously tried: none noted -Pain is managed.  She is taking half tablet of Percocet for breakthrough.  Some days she does not have to use at all.  Denies any nausea at this time - she has never had to use her Zofran or compazine.  -Recommended to continue current medication Seems to be doing well overall with chemo and pain management.  No constipation - she uses miralax daily and confirms regular bowel movements.  Continue supportive care - no changes.         CBeverly Milch PharmD Clinical Pharmacist  [phone]

## 2022-12-10 ENCOUNTER — Other Ambulatory Visit (HOSPITAL_COMMUNITY): Payer: Self-pay

## 2022-12-11 ENCOUNTER — Other Ambulatory Visit: Payer: Self-pay

## 2022-12-11 DIAGNOSIS — Z7189 Other specified counseling: Secondary | ICD-10-CM

## 2022-12-11 DIAGNOSIS — C9002 Multiple myeloma in relapse: Secondary | ICD-10-CM

## 2022-12-11 MED ORDER — POMALIDOMIDE 2 MG PO CAPS: 2.0000 mg | ORAL_CAPSULE | Freq: Every day | ORAL | 0 refills | Status: DC

## 2022-12-16 ENCOUNTER — Other Ambulatory Visit: Payer: Self-pay

## 2022-12-16 DIAGNOSIS — C9000 Multiple myeloma not having achieved remission: Secondary | ICD-10-CM

## 2022-12-16 MED ORDER — FENTANYL 12 MCG/HR TD PT72: 1.0000 | MEDICATED_PATCH | TRANSDERMAL | 0 refills | Status: DC

## 2022-12-22 ENCOUNTER — Inpatient Hospital Stay: Payer: Medicare HMO

## 2022-12-22 ENCOUNTER — Inpatient Hospital Stay: Payer: Medicare HMO | Attending: Hematology

## 2022-12-22 ENCOUNTER — Inpatient Hospital Stay: Payer: Medicare HMO | Admitting: Hematology

## 2022-12-22 ENCOUNTER — Other Ambulatory Visit: Payer: Self-pay

## 2022-12-22 VITALS — BP 149/81 | HR 69 | Temp 98.0°F | Resp 12

## 2022-12-22 VITALS — BP 154/83 | HR 72 | Temp 97.8°F | Resp 16 | Ht 60.0 in | Wt 108.2 lb

## 2022-12-22 DIAGNOSIS — G893 Neoplasm related pain (acute) (chronic): Secondary | ICD-10-CM | POA: Diagnosis not present

## 2022-12-22 DIAGNOSIS — Z86010 Personal history of colonic polyps: Secondary | ICD-10-CM | POA: Insufficient documentation

## 2022-12-22 DIAGNOSIS — C9000 Multiple myeloma not having achieved remission: Secondary | ICD-10-CM | POA: Insufficient documentation

## 2022-12-22 DIAGNOSIS — E041 Nontoxic single thyroid nodule: Secondary | ICD-10-CM | POA: Diagnosis not present

## 2022-12-22 DIAGNOSIS — M8580 Other specified disorders of bone density and structure, unspecified site: Secondary | ICD-10-CM | POA: Diagnosis not present

## 2022-12-22 DIAGNOSIS — Z95828 Presence of other vascular implants and grafts: Secondary | ICD-10-CM

## 2022-12-22 DIAGNOSIS — C9002 Multiple myeloma in relapse: Secondary | ICD-10-CM

## 2022-12-22 DIAGNOSIS — Z7189 Other specified counseling: Secondary | ICD-10-CM | POA: Diagnosis not present

## 2022-12-22 DIAGNOSIS — Z79899 Other long term (current) drug therapy: Secondary | ICD-10-CM | POA: Insufficient documentation

## 2022-12-22 DIAGNOSIS — D649 Anemia, unspecified: Secondary | ICD-10-CM | POA: Diagnosis not present

## 2022-12-22 DIAGNOSIS — Z5112 Encounter for antineoplastic immunotherapy: Secondary | ICD-10-CM | POA: Diagnosis present

## 2022-12-22 LAB — CBC WITH DIFFERENTIAL (CANCER CENTER ONLY)
Abs Immature Granulocytes: 0.01 10*3/uL (ref 0.00–0.07)
Basophils Absolute: 0.1 10*3/uL (ref 0.0–0.1)
Basophils Relative: 4 %
Eosinophils Absolute: 0.1 10*3/uL (ref 0.0–0.5)
Eosinophils Relative: 2 %
HCT: 36 % (ref 36.0–46.0)
Hemoglobin: 12 g/dL (ref 12.0–15.0)
Immature Granulocytes: 0 %
Lymphocytes Relative: 11 %
Lymphs Abs: 0.4 10*3/uL — ABNORMAL LOW (ref 0.7–4.0)
MCH: 32.8 pg (ref 26.0–34.0)
MCHC: 33.3 g/dL (ref 30.0–36.0)
MCV: 98.4 fL (ref 80.0–100.0)
Monocytes Absolute: 0.7 10*3/uL (ref 0.1–1.0)
Monocytes Relative: 20 %
Neutro Abs: 2.2 10*3/uL (ref 1.7–7.7)
Neutrophils Relative %: 63 %
Platelet Count: 317 10*3/uL (ref 150–400)
RBC: 3.66 MIL/uL — ABNORMAL LOW (ref 3.87–5.11)
RDW: 14.1 % (ref 11.5–15.5)
WBC Count: 3.4 10*3/uL — ABNORMAL LOW (ref 4.0–10.5)
nRBC: 0 % (ref 0.0–0.2)

## 2022-12-22 LAB — CMP (CANCER CENTER ONLY)
ALT: 13 U/L (ref 0–44)
AST: 12 U/L — ABNORMAL LOW (ref 15–41)
Albumin: 4.2 g/dL (ref 3.5–5.0)
Alkaline Phosphatase: 32 U/L — ABNORMAL LOW (ref 38–126)
Anion gap: 7 (ref 5–15)
BUN: 14 mg/dL (ref 8–23)
CO2: 26 mmol/L (ref 22–32)
Calcium: 9.2 mg/dL (ref 8.9–10.3)
Chloride: 106 mmol/L (ref 98–111)
Creatinine: 0.63 mg/dL (ref 0.44–1.00)
GFR, Estimated: >60 mL/min (ref >60)
Glucose, Bld: 100 mg/dL — ABNORMAL HIGH (ref 70–99)
Potassium: 4.2 mmol/L (ref 3.5–5.1)
Sodium: 139 mmol/L (ref 135–145)
Total Bilirubin: 0.3 mg/dL (ref 0.3–1.2)
Total Protein: 6.7 g/dL (ref 6.5–8.1)

## 2022-12-22 MED ORDER — HEPARIN SOD (PORK) LOCK FLUSH 100 UNIT/ML IV SOLN
500.0000 [IU] | Freq: Once | INTRAVENOUS | Status: AC | PRN
  Administered 2022-12-22: 500 [IU]

## 2022-12-22 MED ORDER — ACETAMINOPHEN 325 MG PO TABS
650.0000 mg | ORAL_TABLET | Freq: Once | ORAL | Status: AC
  Administered 2022-12-22: 650 mg via ORAL
  Filled 2022-12-22: qty 2

## 2022-12-22 MED ORDER — SODIUM CHLORIDE 0.9% FLUSH
10.0000 mL | INTRAVENOUS | Status: DC | PRN
  Administered 2022-12-22: 10 mL

## 2022-12-22 MED ORDER — SODIUM CHLORIDE 0.9 % IV SOLN
16.0000 mg/kg | Freq: Once | INTRAVENOUS | Status: AC
  Administered 2022-12-22: 700 mg via INTRAVENOUS
  Filled 2022-12-22: qty 15

## 2022-12-22 MED ORDER — HYDROXYZINE HCL 25 MG PO TABS
25.0000 mg | ORAL_TABLET | Freq: Once | ORAL | Status: AC
  Administered 2022-12-22: 25 mg via ORAL
  Filled 2022-12-22: qty 1

## 2022-12-22 MED ORDER — ZOLEDRONIC ACID 4 MG/100ML IV SOLN
4.0000 mg | Freq: Once | INTRAVENOUS | Status: AC
  Administered 2022-12-22: 4 mg via INTRAVENOUS
  Filled 2022-12-22: qty 100

## 2022-12-22 MED ORDER — SODIUM CHLORIDE 0.9% FLUSH
10.0000 mL | Freq: Once | INTRAVENOUS | Status: AC
  Administered 2022-12-22: 10 mL

## 2022-12-22 MED ORDER — SODIUM CHLORIDE 0.9 % IV SOLN: Freq: Once | INTRAVENOUS | Status: AC

## 2022-12-22 MED ORDER — FAMOTIDINE IN NACL 20-0.9 MG/50ML-% IV SOLN
20.0000 mg | Freq: Once | INTRAVENOUS | Status: AC
  Administered 2022-12-22: 20 mg via INTRAVENOUS
  Filled 2022-12-22: qty 50

## 2022-12-22 NOTE — Progress Notes (Signed)
Patient took steroid at home.

## 2022-12-22 NOTE — Progress Notes (Signed)
Patient seen by MD today  Vitals are not all within treatment parameters. Dr Irene Limbo aware BP is  154/83  Labs reviewed: and are within treatment parameters.  Per physician team, patient is ready for treatment and there are NO modifications to the treatment plan.

## 2022-12-22 NOTE — Progress Notes (Signed)
[name]   [phone] [phone]   Clinic Follow up Note   Patient Care Team: [name], MD as PCP - General (Family Medicine) [name], MD as Consulting Physician (Hematology) Bond, [name], MD as Referring Physician (Ophthalmology) [name], [name], MD as Consulting Physician (Hematology and Oncology) [name], MD as Consulting Physician (Internal Medicine) [name], MD as Consulting Physician (General Surgery) [name], [facility] as Pharmacist (Pharmacist)  Date of Service: 12/22/2022  CHIEF COMPLAINT:  Follow-up for continuation and management of multiple myeloma   SUMMARY OF ONCOLOGIC HISTORY: Oncology History  Multiple myeloma not having achieved remission ([name])  12/12/2018 Initial Diagnosis   Multiple myeloma not having achieved remission ([name])   12/20/2018 - 10/04/2019 Chemotherapy   The patient had dexamethasone (DECADRON) tablet 20 mg, 20 mg (100 % of original dose 20 mg), Oral, Once, 14 of 14 cycles Dose modification: 20 mg (original dose 20 mg, Cycle 1), 10 mg (original dose 20 mg, Cycle 14) Administration: 20 mg (12/20/2018), 20 mg (12/27/2018), 20 mg (01/10/2019), 20 mg (01/17/2019), 20 mg (01/31/2019), 20 mg (02/07/2019), 20 mg (03/14/2019), 20 mg (03/21/2019), 20 mg (02/21/2019), 20 mg (02/28/2019), 20 mg (04/04/2019), 20 mg (04/11/2019), 20 mg (04/25/2019), 20 mg (05/02/2019), 20 mg (05/16/2019), 20 mg (05/23/2019), 20 mg (06/06/2019), 20 mg (06/13/2019), 20 mg (06/27/2019), 20 mg (07/04/2019), 20 mg (07/18/2019), 20 mg (07/25/2019), 20 mg (08/08/2019), 20 mg (08/15/2019), 20 mg (08/29/2019), 20 mg (09/05/2019), 10 mg (09/19/2019) lenalidomide (REVLIMID) 15 MG capsule, 1 of 1 cycle, Start date: 01/18/2019, End date: 02/21/2019 bortezomib SQ (VELCADE) chemo injection 2 mg, 1.3 mg/m2 = 2 mg, Subcutaneous,  Once, 14 of 14 cycles Administration: 2 mg (12/20/2018), 2 mg (12/27/2018), 2 mg (12/23/2018), 2 mg (12/30/2018),  2 mg (01/10/2019), 2 mg (01/17/2019), 2 mg (01/13/2019), 2 mg (01/20/2019), 2 mg (01/31/2019), 2 mg (02/07/2019), 2 mg (02/03/2019), 2 mg (02/10/2019), 2 mg (03/14/2019), 2 mg (03/17/2019), 2 mg (03/21/2019), 2 mg (03/24/2019), 2 mg (02/21/2019), 2 mg (02/24/2019), 2 mg (02/28/2019), 2 mg (03/03/2019), 2 mg (04/04/2019), 2 mg (04/06/2019), 2 mg (04/11/2019), 2 mg (04/14/2019), 2 mg (04/25/2019), 2 mg (04/28/2019), 2 mg (05/02/2019), 2 mg (05/05/2019), 2 mg (05/16/2019), 2 mg (05/19/2019), 2 mg (05/23/2019), 2 mg (05/26/2019), 2 mg (06/06/2019), 2 mg (06/09/2019), 2 mg (06/13/2019), 2 mg (06/16/2019), 2 mg (06/27/2019), 2 mg (06/30/2019), 2 mg (07/04/2019), 2 mg (07/07/2019), 2 mg (07/18/2019), 2 mg (07/21/2019), 2 mg (07/25/2019), 2 mg (07/28/2019), 2 mg (08/08/2019), 2 mg (08/11/2019), 2 mg (08/15/2019), 2 mg (08/18/2019), 2 mg (08/29/2019), 2 mg (09/01/2019), 2 mg (09/05/2019), 2 mg (09/08/2019), 2 mg (09/19/2019), 2 mg (10/04/2019)  for chemotherapy treatment.    Multiple myeloma in relapse ([name])  04/16/2021 Initial Diagnosis   Multiple myeloma in relapse ([name])   04/30/2021 - 04/15/2022 Chemotherapy   Patient is on Treatment Plan : MYELOMA RELAPSED/REFRACTORY KCd q28d     05/12/2022 - 07/08/2022 Chemotherapy   Patient is on Treatment Plan : MYELOMA Daratumumab + Pomalidomide + Dexamethasone q28d x 7 cycles     05/22/2022 -  Chemotherapy   Patient is on Treatment Plan : MYELOMA Daratumumab IV + Pomalidomide + Dexamethasone q28d x 7 cycles       CURRENT THERAPY: Dara/Pom/Dex  INTERVAL HISTORY:  Megan Olson is a 82 y.o. female returns for continued evaluation and management of multiple myeloma. He is here to start cycle 9 of her treatment.   Patient was last seen by me on 11/24/2022 and  she was doing well overall except she complained of mild dark skin near her left side of her face.   Patient reports she has been doing well overall without any new medical concerns since our last visit. She notes she has been tolerating her treatment well  without any toxicities.   During this visit, she complains of upper back pain/tenderness and right shoulder pain. She contributes her upper back pain to posture problems. She notes that her upper back pain improves with movement.   She also reports mild skin spot near her middle lower back. She notes that the skin spot/rash is mildly painful upon touch.   Patient reports she took Dexamethasone 16 mg steroids this morning before her  treatment today.   She denies fever, chills, night sweats, unexpected weight loss, infection issues, shortness of breath, abdominal pain, chest pain, or leg swelling.    MEDICAL HISTORY:  Past Medical History:  Diagnosis Date   Anemia    Glaucoma    HYPERTENSION 03/11/2007   Multiple myeloma ([name])    OSTEOPENIA 03/11/2007   Pre-diabetes    Thyroid cancer ([name])    Thyroid disease    Tubular adenoma of colon 07/2015    SURGICAL HISTORY: Past Surgical History:  Procedure Laterality Date   BONE MARROW BIOPSY     multiple   BREAST EXCISIONAL BIOPSY Right 2000   BREAST LUMPECTOMY  1990   benign   CATARACT EXTRACTION Bilateral 2018   DILATION AND CURETTAGE OF UTERUS     bleeding at menopause. No uterine cancer   IR IMAGING GUIDED PORT INSERTION  05/16/2021   IR RADIOLOGIST EVAL & MGMT  12/13/2018   THYROIDECTOMY N/A 08/02/2020   Procedure: TOTAL THYROIDECTOMY;  Surgeon: [name], MD;  Location: [facility];  Service: General;  Laterality: N/A;   TONSILLECTOMY     age 78   I have reviewed the social history and family history with the patient and they are unchanged from previous note.  ALLERGIES:  is allergic to ace inhibitors, benadryl [diphenhydramine], diamox [acetazolamide], sulfamethoxazole, lenalidomide, and penicillins.  MEDICATIONS:  Current Outpatient Medications  Medication Sig Dispense Refill   acyclovir (ZOVIRAX) 400 MG tablet Take 1 tablet (400 mg total) by mouth 2 (two) times daily. 60 tablet 3   ALPHAGAN P 0.1 % SOLN Place 1 drop into  both eyes in the morning, at noon, and at bedtime.      amLODipine (NORVASC) 10 MG tablet Take 1 tablet (10 mg total) by mouth daily. 90 tablet 3   aspirin EC 81 MG tablet Take 81 mg by mouth daily.     Cholecalciferol (VITAMIN D3) 125 MCG (5000 UT) TABS Take 5,000 Units by mouth daily.     Co-Enzyme Q-10 100 MG CAPS Take 100 mg by mouth daily.     dexamethasone (DECADRON) 4 MG tablet Take 4 tablets (16 mg total) by mouth as directed. Take one hour prior to monthly Darzalex infusion. 12 tablet 3   dorzolamide-timolol (COSOPT) 22.3-6.8 MG/ML ophthalmic solution Place 1 drop into both eyes 2 (two) times daily.   11   fentaNYL (DURAGESIC) 12 MCG/HR Place 1 patch onto the skin every 3 (three) days. 10 patch 0   levothyroxine (SYNTHROID) 88 MCG tablet Take 1 tablet (88 mcg total) by mouth daily before breakfast. 90 tablet 3   lidocaine-prilocaine (EMLA) cream Apply to affected area once 30 g 3   LUMIGAN 0.01 % SOLN 1 drop at bedtime. (Patient not taking: Reported on 07/08/2022)  magnesium chloride (SLOW-MAG) 64 MG TBEC SR tablet Take by mouth.     Multiple Vitamins-Minerals (MULTIVITAMIN WITH MINERALS) tablet Take 1 tablet by mouth daily. Centrum Silver 50 +     Netarsudil-Latanoprost (ROCKLATAN) 0.02-0.005 % SOLN Place 1 drop into both eyes daily.     Omega-3 1000 MG CAPS Take 1,000 mg by mouth daily.      ondansetron (ZOFRAN) 8 MG tablet Take 1 tablet (8 mg total) by mouth every 8 (eight) hours as needed for nausea or vomiting. 30 tablet 1   oxyCODONE-acetaminophen (PERCOCET) 5-325 MG tablet Take 1-2 tablets by mouth every 4 (four) hours as needed for moderate pain or severe pain. 90 tablet 0   polyethylene glycol (MIRALAX) packet Take 17 g by mouth daily. 30 each 1   pomalidomide (POMALYST) 2 MG capsule Take 1 capsule (2 mg total) by mouth daily. Take for 21 days on, 7 days off, repeat every 28 days 21 capsule 0   prochlorperazine (COMPAZINE) 10 MG tablet Take 1 tablet (10 mg total) by mouth every  6 (six) hours as needed for nausea or vomiting. 30 tablet 1   senna-docusate (SENOKOT-S) 8.6-50 MG tablet Take 2 tablets by mouth at bedtime. 60 tablet 0   Simethicone (GAS-X PO) Take 1 tablet by mouth as needed.     vitamin C (ASCORBIC ACID) 500 MG tablet Take 500 mg by mouth 2 (two) times a week.      No current facility-administered medications for this visit.    PHYSICAL EXAMINATION:. .BP (!) 154/83 (BP Location: Left Arm, Patient Position: Sitting) Comment: nurse notified  Pulse 72   Temp 97.8 F (36.6 C) (Temporal)   Resp 16   Ht 5' (1.524 m)   Wt 108 lb 3.2 oz (49.1 kg)   SpO2 99%   BMI 21.13 kg/m   GENERAL:alert, in no acute distress and comfortable SKIN: no acute rashes, no significant lesions EYES: conjunctiva are pink and non-injected, sclera anicteric OROPHARYNX: MMM, no exudates, no oropharyngeal erythema or ulceration NECK: supple, no JVD LYMPH:  no palpable lymphadenopathy in the cervical, axillary or inguinal regions LUNGS: clear to auscultation b/l with normal respiratory effort HEART: regular rate & rhythm ABDOMEN:  normoactive bowel sounds , non tender, not distended. Extremity: no pedal edema PSYCH: alert & oriented x 3 with fluent speech NEURO: no focal motor/sensory deficits   LABORATORY DATA:   .    Latest Ref Rng & Units 12/22/2022    8:30 AM 11/24/2022    9:28 AM 10/27/2022    9:06 AM  CBC  WBC 4.0 - 10.5 K/uL 3.4  2.5  3.9   Hemoglobin 12.0 - 15.0 g/dL 12.0  11.7  11.3   Hematocrit 36.0 - 46.0 % 36.0  34.3  33.9   Platelets 150 - 400 K/uL 317  290  316       Latest Ref Rng & Units 12/22/2022    8:30 AM 11/24/2022    9:28 AM 10/27/2022    9:06 AM  CMP  Glucose 70 - 99 mg/dL 100  119  87   BUN 8 - 23 mg/dL 14  16  18    Creatinine 0.44 - 1.00 mg/dL 0.63  0.60  0.64   Sodium 135 - 145 mmol/L 139  139  138   Potassium 3.5 - 5.1 mmol/L 4.2  4.4  4.3   Chloride 98 - 111 mmol/L 106  106  106   CO2 22 - 32 mmol/L 26  27  28  Calcium 8.9 - 10.3  mg/dL 9.2  8.9  9.1   Total Protein 6.5 - 8.1 g/dL 6.7  6.2  6.0   Total Bilirubin 0.3 - 1.2 mg/dL 0.3  0.4  0.4   Alkaline Phos 38 - 126 U/L 32  30  33   AST 15 - 41 U/L 12  12  12    ALT 0 - 44 U/L 13  11  12        RADIOGRAPHIC STUDIES: I have personally reviewed the radiological images as listed and agreed with the findings in the report. No results found.   ASSESSMENT & PLAN:   Megan Olson is a 82 y.o. female who presents for a follow up for multiple myeloma.   1. 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  She achieved complete response with negative M protein and negative PET scan in December 2020. -She subsequently went on Ninlaro maintenance therapy -PET scan from May 08, 2021, which showed focal activity within the right femur, consistent with active multiple myeloma, no other hypermetabolic lesion or plasmacytoma. -05/06/2021 bone marrow biopsy, which showed 3% plasma cells. -Recommend Zometa every 2 months.  2. Mild anemia -We will continue to monitor with myeloma treatment and current medication  3.  Cancer related pain  -Currently well controlled -Continue fentanyl patch at 12 mcg/h and Percocet for break through pain.    PLAN: -Discussed lab results from today, 12/22/2022, with the patient. CBC shows slightly decreased WBC at 3.4, but stable overall. CMP is stable overall.  -Discussed multiple myeloma panel results from 11/24/2022 which showed M-protein level at 0.4, which decreased from previous multiple panel.  -Discuss to reduce Dexamethasone Pre-treatment dosage to 12 mg from 16 mg.   -No new significant toxicities from a current plan of treatment with Dara/Pom/Dex at this time. -Patient can proceed with cycle 9 of her current Dara/Pom/Dex without any dose modifications. No dexamethasone  post-treatment.  -Answered all of patient's questions.  -Continue Zometa every 8 weeks. No new dental issues.   FOLLOW-UP: Per integrated scheduling  The total time spent in the appointment was 30 minutes* .  All of the patient's questions were answered with apparent satisfaction. The patient knows to call the clinic with any problems, questions or concerns.   Sullivan Lone MD MS AAHIVMS [facility] Hematology/Oncology Physician [facility]  .*Total Encounter Time as defined by the Centers for Medicare and Medicaid Services includes, in addition to the face-to-face time of a patient visit (documented in the note above) non-face-to-face time: obtaining and reviewing outside history, ordering and reviewing medications, tests or procedures, care coordination (communications with other health care professionals or caregivers) and documentation in the medical record.  I, Cleda Mccreedy, am acting as a Education administrator for Sullivan Lone, MD.  .I have reviewed the above documentation for accuracy and completeness, and I agree with the above. [name] MD

## 2022-12-22 NOTE — Patient Instructions (Signed)
[facility]  Discharge Instructions: Thank you for choosing [facility] to provide your oncology and hematology care.   If you have a lab appointment with the Cancer Center, please go directly to the Cancer Center and check in at the registration area.   Wear comfortable clothing and clothing appropriate for easy access to any Portacath or PICC line.   We strive to give you quality time with your provider. You may need to reschedule your appointment if you arrive late (15 or more minutes).  Arriving late affects you and other patients whose appointments are after yours.  Also, if you miss three or more appointments without notifying the office, you may be dismissed from the clinic at the provider's discretion.      For prescription refill requests, have your pharmacy contact our office and allow 72 hours for refills to be completed.    Today you received the following chemotherapy and/or immunotherapy agents: daratumumab     To help prevent nausea and vomiting after your treatment, we encourage you to take your nausea medication as directed.  BELOW ARE SYMPTOMS THAT SHOULD BE REPORTED IMMEDIATELY: *FEVER GREATER THAN 100.4 F (38 C) OR HIGHER *CHILLS OR SWEATING *NAUSEA AND VOMITING THAT IS NOT CONTROLLED WITH YOUR NAUSEA MEDICATION *UNUSUAL SHORTNESS OF BREATH *UNUSUAL BRUISING OR BLEEDING *URINARY PROBLEMS (pain or burning when urinating, or frequent urination) *BOWEL PROBLEMS (unusual diarrhea, constipation, pain near the anus) TENDERNESS IN MOUTH AND THROAT WITH OR WITHOUT PRESENCE OF ULCERS (sore throat, sores in mouth, or a toothache) UNUSUAL RASH, SWELLING OR PAIN  UNUSUAL VAGINAL DISCHARGE OR ITCHING   Items with * indicate a potential emergency and should be followed up as soon as possible or go to the Emergency Department if any problems should occur.  Please show the CHEMOTHERAPY ALERT CARD or IMMUNOTHERAPY ALERT CARD at  check-in to the Emergency Department and triage nurse.  Should you have questions after your visit or need to cancel or reschedule your appointment, please contact [facility]  Dept: [phone]  and follow the prompts.  Office hours are 8:00 a.m. to 4:30 p.m. Monday - Friday. Please note that voicemails left after 4:00 p.m. may not be returned until the following business day.  We are closed weekends and major holidays. You have access to a nurse at all times for urgent questions. Please call the main number to the clinic Dept: [phone] and follow the prompts.   For any non-urgent questions, you may also contact your provider using MyChart. We now offer e-Visits for anyone 18 and older to request care online for non-urgent symptoms. For details visit [email or web address].   Also download the MyChart app! Go to the app store, search "MyChart", open the app, select [facility], and log in with your MyChart username and password.  Masks are optional in the cancer centers. If you would like for your care team to wear a mask while they are taking care of you, please let them know. You may have one support person who is at least 82 years old accompany you for your appointments. 

## 2022-12-28 ENCOUNTER — Encounter: Payer: Self-pay | Admitting: Hematology

## 2022-12-28 LAB — MULTIPLE MYELOMA PANEL, SERUM
Albumin SerPl Elph-Mcnc: 3.7 g/dL (ref 2.9–4.4)
Albumin/Glob SerPl: 1.4 (ref 0.7–1.7)
Alpha 1: 0.3 g/dL (ref 0.0–0.4)
Alpha2 Glob SerPl Elph-Mcnc: 0.7 g/dL (ref 0.4–1.0)
B-Globulin SerPl Elph-Mcnc: 1.5 g/dL — ABNORMAL HIGH (ref 0.7–1.3)
Gamma Glob SerPl Elph-Mcnc: 0.2 g/dL — ABNORMAL LOW (ref 0.4–1.8)
Globulin, Total: 2.7 g/dL (ref 2.2–3.9)
IgA: 34 mg/dL — ABNORMAL LOW (ref 64–422)
IgG (Immunoglobin G), Serum: 939 mg/dL (ref 586–1602)
IgM (Immunoglobulin M), Srm: 10 mg/dL — ABNORMAL LOW (ref 26–217)
M Protein SerPl Elph-Mcnc: 0.9 g/dL — ABNORMAL HIGH
Total Protein ELP: 6.4 g/dL (ref 6.0–8.5)

## 2023-01-04 DIAGNOSIS — H401132 Primary open-angle glaucoma, bilateral, moderate stage: Secondary | ICD-10-CM | POA: Diagnosis not present

## 2023-01-05 ENCOUNTER — Other Ambulatory Visit: Payer: Self-pay

## 2023-01-05 DIAGNOSIS — Z7189 Other specified counseling: Secondary | ICD-10-CM

## 2023-01-05 DIAGNOSIS — C9002 Multiple myeloma in relapse: Secondary | ICD-10-CM

## 2023-01-05 MED ORDER — POMALIDOMIDE 2 MG PO CAPS
2.0000 mg | ORAL_CAPSULE | Freq: Every day | ORAL | 0 refills | Status: DC
Start: 2023-01-05 — End: 2023-02-03

## 2023-01-13 ENCOUNTER — Other Ambulatory Visit: Payer: Self-pay

## 2023-01-13 DIAGNOSIS — C9000 Multiple myeloma not having achieved remission: Secondary | ICD-10-CM

## 2023-01-14 ENCOUNTER — Encounter: Payer: Self-pay | Admitting: Hematology

## 2023-01-14 MED ORDER — FENTANYL 12 MCG/HR TD PT72
1.0000 | MEDICATED_PATCH | TRANSDERMAL | 0 refills | Status: DC
Start: 2023-01-14 — End: 2023-02-15

## 2023-01-17 ENCOUNTER — Other Ambulatory Visit: Payer: Self-pay

## 2023-01-19 ENCOUNTER — Inpatient Hospital Stay: Payer: Medicare HMO

## 2023-01-19 ENCOUNTER — Other Ambulatory Visit: Payer: Self-pay

## 2023-01-19 ENCOUNTER — Inpatient Hospital Stay: Payer: Medicare HMO | Attending: Hematology | Admitting: Hematology

## 2023-01-19 VITALS — BP 144/78 | HR 70 | Temp 97.7°F | Resp 16 | Ht 60.0 in | Wt 108.0 lb

## 2023-01-19 VITALS — BP 126/70 | HR 62 | Resp 16

## 2023-01-19 DIAGNOSIS — I10 Essential (primary) hypertension: Secondary | ICD-10-CM | POA: Insufficient documentation

## 2023-01-19 DIAGNOSIS — Z5112 Encounter for antineoplastic immunotherapy: Secondary | ICD-10-CM | POA: Insufficient documentation

## 2023-01-19 DIAGNOSIS — C9002 Multiple myeloma in relapse: Secondary | ICD-10-CM

## 2023-01-19 DIAGNOSIS — Z79899 Other long term (current) drug therapy: Secondary | ICD-10-CM | POA: Diagnosis not present

## 2023-01-19 DIAGNOSIS — Z5111 Encounter for antineoplastic chemotherapy: Secondary | ICD-10-CM

## 2023-01-19 DIAGNOSIS — Z79890 Hormone replacement therapy (postmenopausal): Secondary | ICD-10-CM | POA: Diagnosis not present

## 2023-01-19 DIAGNOSIS — R222 Localized swelling, mass and lump, trunk: Secondary | ICD-10-CM | POA: Insufficient documentation

## 2023-01-19 DIAGNOSIS — C9000 Multiple myeloma not having achieved remission: Secondary | ICD-10-CM

## 2023-01-19 DIAGNOSIS — Z95828 Presence of other vascular implants and grafts: Secondary | ICD-10-CM

## 2023-01-19 DIAGNOSIS — Z7961 Long term (current) use of immunomodulator: Secondary | ICD-10-CM | POA: Diagnosis not present

## 2023-01-19 DIAGNOSIS — M8580 Other specified disorders of bone density and structure, unspecified site: Secondary | ICD-10-CM | POA: Diagnosis not present

## 2023-01-19 DIAGNOSIS — M25511 Pain in right shoulder: Secondary | ICD-10-CM | POA: Diagnosis not present

## 2023-01-19 DIAGNOSIS — Z7982 Long term (current) use of aspirin: Secondary | ICD-10-CM | POA: Insufficient documentation

## 2023-01-19 DIAGNOSIS — R5383 Other fatigue: Secondary | ICD-10-CM | POA: Insufficient documentation

## 2023-01-19 DIAGNOSIS — Z7189 Other specified counseling: Secondary | ICD-10-CM

## 2023-01-19 DIAGNOSIS — Z85850 Personal history of malignant neoplasm of thyroid: Secondary | ICD-10-CM | POA: Diagnosis not present

## 2023-01-19 LAB — CBC WITH DIFFERENTIAL (CANCER CENTER ONLY)
Abs Immature Granulocytes: 0.01 10*3/uL (ref 0.00–0.07)
Basophils Absolute: 0.1 10*3/uL (ref 0.0–0.1)
Basophils Relative: 4 %
Eosinophils Absolute: 0.1 10*3/uL (ref 0.0–0.5)
Eosinophils Relative: 2 %
HCT: 35.3 % — ABNORMAL LOW (ref 36.0–46.0)
Hemoglobin: 11.8 g/dL — ABNORMAL LOW (ref 12.0–15.0)
Immature Granulocytes: 0 %
Lymphocytes Relative: 8 %
Lymphs Abs: 0.3 10*3/uL — ABNORMAL LOW (ref 0.7–4.0)
MCH: 32.6 pg (ref 26.0–34.0)
MCHC: 33.4 g/dL (ref 30.0–36.0)
MCV: 97.5 fL (ref 80.0–100.0)
Monocytes Absolute: 0.3 10*3/uL (ref 0.1–1.0)
Monocytes Relative: 11 %
Neutro Abs: 2.2 10*3/uL (ref 1.7–7.7)
Neutrophils Relative %: 75 %
Platelet Count: 269 10*3/uL (ref 150–400)
RBC: 3.62 MIL/uL — ABNORMAL LOW (ref 3.87–5.11)
RDW: 14.7 % (ref 11.5–15.5)
WBC Count: 3 10*3/uL — ABNORMAL LOW (ref 4.0–10.5)
nRBC: 0 % (ref 0.0–0.2)

## 2023-01-19 LAB — CMP (CANCER CENTER ONLY)
ALT: 11 U/L (ref 0–44)
AST: 13 U/L — ABNORMAL LOW (ref 15–41)
Albumin: 4.1 g/dL (ref 3.5–5.0)
Alkaline Phosphatase: 28 U/L — ABNORMAL LOW (ref 38–126)
Anion gap: 6 (ref 5–15)
BUN: 19 mg/dL (ref 8–23)
CO2: 27 mmol/L (ref 22–32)
Calcium: 9.3 mg/dL (ref 8.9–10.3)
Chloride: 106 mmol/L (ref 98–111)
Creatinine: 0.69 mg/dL (ref 0.44–1.00)
GFR, Estimated: >60 mL/min (ref >60)
Glucose, Bld: 116 mg/dL — ABNORMAL HIGH (ref 70–99)
Potassium: 4.2 mmol/L (ref 3.5–5.1)
Sodium: 139 mmol/L (ref 135–145)
Total Bilirubin: 0.4 mg/dL (ref 0.3–1.2)
Total Protein: 6.9 g/dL (ref 6.5–8.1)

## 2023-01-19 MED ORDER — ACETAMINOPHEN 325 MG PO TABS
650.0000 mg | ORAL_TABLET | Freq: Once | ORAL | Status: AC
  Administered 2023-01-19: 650 mg via ORAL
  Filled 2023-01-19: qty 2

## 2023-01-19 MED ORDER — SODIUM CHLORIDE 0.9 % IV SOLN
16.0000 mg/kg | Freq: Once | INTRAVENOUS | Status: AC
  Administered 2023-01-19: 800 mg via INTRAVENOUS
  Filled 2023-01-19: qty 40

## 2023-01-19 MED ORDER — FAMOTIDINE IN NACL 20-0.9 MG/50ML-% IV SOLN
20.0000 mg | Freq: Once | INTRAVENOUS | Status: AC
  Administered 2023-01-19: 20 mg via INTRAVENOUS
  Filled 2023-01-19: qty 50

## 2023-01-19 MED ORDER — SODIUM CHLORIDE 0.9 % IV SOLN: Freq: Once | INTRAVENOUS | Status: AC

## 2023-01-19 MED ORDER — SODIUM CHLORIDE 0.9% FLUSH
10.0000 mL | Freq: Once | INTRAVENOUS | Status: AC
  Administered 2023-01-19: 10 mL

## 2023-01-19 MED ORDER — HEPARIN SOD (PORK) LOCK FLUSH 100 UNIT/ML IV SOLN
500.0000 [IU] | Freq: Once | INTRAVENOUS | Status: AC | PRN
  Administered 2023-01-19: 500 [IU]

## 2023-01-19 MED ORDER — HYDROXYZINE HCL 25 MG PO TABS
25.0000 mg | ORAL_TABLET | Freq: Once | ORAL | Status: AC
  Administered 2023-01-19: 25 mg via ORAL
  Filled 2023-01-19: qty 1

## 2023-01-19 MED ORDER — SODIUM CHLORIDE 0.9% FLUSH
10.0000 mL | INTRAVENOUS | Status: DC | PRN
  Administered 2023-01-19: 10 mL

## 2023-01-19 NOTE — Progress Notes (Signed)
.  Patient seen by Dr. Kale  Vitals are within treatment parameters.  Labs reviewed: and are within treatment parameters. CMP still pending  Per physician team, patient is ready for treatment and there are NO modifications to the treatment plan.  

## 2023-01-19 NOTE — Patient Instructions (Signed)
[facility]  Discharge Instructions: Thank you for choosing [facility] to provide your oncology and hematology care.   If you have a lab appointment with the Cancer Center, please go directly to the Cancer Center and check in at the registration area.   Wear comfortable clothing and clothing appropriate for easy access to any Portacath or PICC line.   We strive to give you quality time with your provider. You may need to reschedule your appointment if you arrive late (15 or more minutes).  Arriving late affects you and other patients whose appointments are after yours.  Also, if you miss three or more appointments without notifying the office, you may be dismissed from the clinic at the provider's discretion.      For prescription refill requests, have your pharmacy contact our office and allow 72 hours for refills to be completed.    Today you received the following chemotherapy and/or immunotherapy agents: daratumumab     To help prevent nausea and vomiting after your treatment, we encourage you to take your nausea medication as directed.  BELOW ARE SYMPTOMS THAT SHOULD BE REPORTED IMMEDIATELY: *FEVER GREATER THAN 100.4 F (38 C) OR HIGHER *CHILLS OR SWEATING *NAUSEA AND VOMITING THAT IS NOT CONTROLLED WITH YOUR NAUSEA MEDICATION *UNUSUAL SHORTNESS OF BREATH *UNUSUAL BRUISING OR BLEEDING *URINARY PROBLEMS (pain or burning when urinating, or frequent urination) *BOWEL PROBLEMS (unusual diarrhea, constipation, pain near the anus) TENDERNESS IN MOUTH AND THROAT WITH OR WITHOUT PRESENCE OF ULCERS (sore throat, sores in mouth, or a toothache) UNUSUAL RASH, SWELLING OR PAIN  UNUSUAL VAGINAL DISCHARGE OR ITCHING   Items with * indicate a potential emergency and should be followed up as soon as possible or go to the Emergency Department if any problems should occur.  Please show the CHEMOTHERAPY ALERT CARD or IMMUNOTHERAPY ALERT CARD at  check-in to the Emergency Department and triage nurse.  Should you have questions after your visit or need to cancel or reschedule your appointment, please contact [facility]  Dept: [phone]  and follow the prompts.  Office hours are 8:00 a.m. to 4:30 p.m. Monday - Friday. Please note that voicemails left after 4:00 p.m. may not be returned until the following business day.  We are closed weekends and major holidays. You have access to a nurse at all times for urgent questions. Please call the main number to the clinic Dept: [phone] and follow the prompts.   For any non-urgent questions, you may also contact your provider using MyChart. We now offer e-Visits for anyone 18 and older to request care online for non-urgent symptoms. For details visit [email or web address].   Also download the MyChart app! Go to the app store, search "MyChart", open the app, select [facility], and log in with your MyChart username and password.  Masks are optional in the cancer centers. If you would like for your care team to wear a mask while they are taking care of you, please let them know. You may have one support person who is at least 82 years old accompany you for your appointments. 

## 2023-01-19 NOTE — Progress Notes (Signed)
Pt took dexamethasone prior to appointment.

## 2023-01-19 NOTE — Progress Notes (Signed)
Per Dr. Candise Che, he would like to dose darzalex based on updated weight. Dose = 800 mg   Megan Olson, PharmD

## 2023-01-19 NOTE — Progress Notes (Signed)
[facility]   [phone] [phone]   Clinic Follow up Note   Patient Care Team: Shelva Majestic, MD as PCP - General (Family Medicine) Johney Maine, MD as Consulting Physician (Hematology) Bond, Doran Stabler, MD as Referring Physician (Ophthalmology) Zigmund Daniel, Maureen Ralphs, MD as Consulting Physician (Hematology and Oncology) Carlus Pavlov, MD as Consulting Physician (Internal Medicine) Darnell Level, MD as Consulting Physician (General Surgery) Erroll Luna, [facility] as Pharmacist (Pharmacist)  Date of Service: 01/19/2023  CHIEF COMPLAINT:  Follow-up for continuation and management of multiple myeloma   SUMMARY OF ONCOLOGIC HISTORY: Oncology History  Multiple myeloma not having achieved remission  12/12/2018 Initial Diagnosis   Multiple myeloma not having achieved remission (HCC)   12/20/2018 - 10/04/2019 Chemotherapy   The patient had dexamethasone (DECADRON) tablet 20 mg, 20 mg (100 % of original dose 20 mg), Oral, Once, 14 of 14 cycles Dose modification: 20 mg (original dose 20 mg, Cycle 1), 10 mg (original dose 20 mg, Cycle 14) Administration: 20 mg (12/20/2018), 20 mg (12/27/2018), 20 mg (01/10/2019), 20 mg (01/17/2019), 20 mg (01/31/2019), 20 mg (02/07/2019), 20 mg (03/14/2019), 20 mg (03/21/2019), 20 mg (02/21/2019), 20 mg (02/28/2019), 20 mg (04/04/2019), 20 mg (04/11/2019), 20 mg (04/25/2019), 20 mg (05/02/2019), 20 mg (05/16/2019), 20 mg (05/23/2019), 20 mg (06/06/2019), 20 mg (06/13/2019), 20 mg (06/27/2019), 20 mg (07/04/2019), 20 mg (07/18/2019), 20 mg (07/25/2019), 20 mg (08/08/2019), 20 mg (08/15/2019), 20 mg (08/29/2019), 20 mg (09/05/2019), 10 mg (09/19/2019) lenalidomide (REVLIMID) 15 MG capsule, 1 of 1 cycle, Start date: 01/18/2019, End date: 02/21/2019 bortezomib SQ (VELCADE) chemo injection 2 mg, 1.3 mg/m2 = 2 mg, Subcutaneous,  Once, 14 of 14 cycles Administration: 2 mg (12/20/2018), 2 mg (12/27/2018), 2 mg (12/23/2018), 2 mg (12/30/2018), 2 mg  (01/10/2019), 2 mg (01/17/2019), 2 mg (01/13/2019), 2 mg (01/20/2019), 2 mg (01/31/2019), 2 mg (02/07/2019), 2 mg (02/03/2019), 2 mg (02/10/2019), 2 mg (03/14/2019), 2 mg (03/17/2019), 2 mg (03/21/2019), 2 mg (03/24/2019), 2 mg (02/21/2019), 2 mg (02/24/2019), 2 mg (02/28/2019), 2 mg (03/03/2019), 2 mg (04/04/2019), 2 mg (04/06/2019), 2 mg (04/11/2019), 2 mg (04/14/2019), 2 mg (04/25/2019), 2 mg (04/28/2019), 2 mg (05/02/2019), 2 mg (05/05/2019), 2 mg (05/16/2019), 2 mg (05/19/2019), 2 mg (05/23/2019), 2 mg (05/26/2019), 2 mg (06/06/2019), 2 mg (06/09/2019), 2 mg (06/13/2019), 2 mg (06/16/2019), 2 mg (06/27/2019), 2 mg (06/30/2019), 2 mg (07/04/2019), 2 mg (07/07/2019), 2 mg (07/18/2019), 2 mg (07/21/2019), 2 mg (07/25/2019), 2 mg (07/28/2019), 2 mg (08/08/2019), 2 mg (08/11/2019), 2 mg (08/15/2019), 2 mg (08/18/2019), 2 mg (08/29/2019), 2 mg (09/01/2019), 2 mg (09/05/2019), 2 mg (09/08/2019), 2 mg (09/19/2019), 2 mg (10/04/2019)  for chemotherapy treatment.    Multiple myeloma in relapse  04/16/2021 Initial Diagnosis   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)   04/30/2021 - 04/15/2022 Chemotherapy   Patient is on Treatment Plan : MYELOMA RELAPSED/REFRACTORY KCd q28d     05/12/2022 - 07/08/2022 Chemotherapy   Patient is on Treatment Plan : MYELOMA Daratumumab + Pomalidomide + Dexamethasone q28d x 7 cycles     05/22/2022 -  Chemotherapy   Patient is on Treatment Plan : MYELOMA Daratumumab IV + Pomalidomide + Dexamethasone q28d x 7 cycles       CURRENT THERAPY: Dara/Pom/Dex  INTERVAL HISTORY:  Megan Olson is a 82 y.o. female returns for continued evaluation and management of multiple myeloma. 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   Patient was last seen by me on 12/22/2022 and was  doing well overall at that time. She did note some mild upper back and right shoulder pain that was worsened with movement.  Today, she states that she has been doing well overall. She states that her shoulder pain has improved with massage and rest. She continues  to have a small nodule in an area on her mid back along the bra line.   She is staying active and walking. She reports fatigue after her infusions but otherwise has good energy levels. She has been eating well.  She denies any fevers, chills, or recent illnesses/infections. She denies any new dental problems- she had a routine dental cleaning yesterday.  She took her pre-treatment Dexamethasone 12mg  today.   MEDICAL HISTORY:  Past Medical History:  Diagnosis Date   Anemia    Glaucoma    HYPERTENSION 03/11/2007   Multiple myeloma (HCC)    OSTEOPENIA 03/11/2007   Pre-diabetes    Thyroid cancer (HCC)    Thyroid disease    Tubular adenoma of colon 07/2015    SURGICAL HISTORY: Past Surgical History:  Procedure Laterality Date   BONE MARROW BIOPSY     multiple   BREAST EXCISIONAL BIOPSY Right 2000   BREAST LUMPECTOMY  1990   benign   CATARACT EXTRACTION Bilateral 2018   DILATION AND CURETTAGE OF UTERUS     bleeding at menopause. No uterine cancer   IR IMAGING GUIDED PORT INSERTION  05/16/2021   IR RADIOLOGIST EVAL & MGMT  12/13/2018   THYROIDECTOMY N/A 08/02/2020   Procedure: TOTAL THYROIDECTOMY;  Surgeon: Darnell Level, MD;  Location: [facility];  Service: General;  Laterality: N/A;   TONSILLECTOMY     age 43   I have reviewed the social history and family history with the patient and they are unchanged from previous note.  ALLERGIES:  is allergic to ace inhibitors, benadryl [diphenhydramine], diamox [acetazolamide], sulfamethoxazole, lenalidomide, and penicillins.  MEDICATIONS:  Current Outpatient Medications  Medication Sig Dispense Refill   acyclovir (ZOVIRAX) 400 MG tablet Take 1 tablet (400 mg total) by mouth 2 (two) times daily. 60 tablet 3   ALPHAGAN P 0.1 % SOLN Place 1 drop into both eyes in the morning, at noon, and at bedtime.      amLODipine (NORVASC) 10 MG tablet Take 1 tablet (10 mg total) by mouth daily. 90 tablet 3   aspirin EC 81 MG tablet Take 81 mg by mouth  daily.     Cholecalciferol (VITAMIN D3) 125 MCG (5000 UT) TABS Take 5,000 Units by mouth daily.     Co-Enzyme Q-10 100 MG CAPS Take 100 mg by mouth daily.     dexamethasone (DECADRON) 4 MG tablet Take 4 tablets (16 mg total) by mouth as directed. Take one hour prior to monthly Darzalex infusion. 12 tablet 3   dorzolamide-timolol (COSOPT) 22.3-6.8 MG/ML ophthalmic solution Place 1 drop into both eyes 2 (two) times daily.   11   fentaNYL (DURAGESIC) 12 MCG/HR Place 1 patch onto the skin every 3 (three) days. 10 patch 0   levothyroxine (SYNTHROID) 88 MCG tablet Take 1 tablet (88 mcg total) by mouth daily before breakfast. 90 tablet 3   lidocaine-prilocaine (EMLA) cream Apply to affected area once 30 g 3   LUMIGAN 0.01 % SOLN 1 drop at bedtime. (Patient not taking: Reported on 07/08/2022)     magnesium chloride (SLOW-MAG) 64 MG TBEC SR tablet Take by mouth.     Multiple Vitamins-Minerals (MULTIVITAMIN WITH MINERALS) tablet Take 1 tablet by mouth daily. Centrum Silver  50 +     Netarsudil-Latanoprost (ROCKLATAN) 0.02-0.005 % SOLN Place 1 drop into both eyes daily.     Omega-3 1000 MG CAPS Take 1,000 mg by mouth daily.      ondansetron (ZOFRAN) 8 MG tablet Take 1 tablet (8 mg total) by mouth every 8 (eight) hours as needed for nausea or vomiting. 30 tablet 1   oxyCODONE-acetaminophen (PERCOCET) 5-325 MG tablet Take 1-2 tablets by mouth every 4 (four) hours as needed for moderate pain or severe pain. 90 tablet 0   polyethylene glycol (MIRALAX) packet Take 17 g by mouth daily. 30 each 1   pomalidomide (POMALYST) 2 MG capsule Take 1 capsule (2 mg total) by mouth daily. Take for 21 days on, 7 days off, repeat every 28 days 21 capsule 0   prochlorperazine (COMPAZINE) 10 MG tablet Take 1 tablet (10 mg total) by mouth every 6 (six) hours as needed for nausea or vomiting. 30 tablet 1   senna-docusate (SENOKOT-S) 8.6-50 MG tablet Take 2 tablets by mouth at bedtime. 60 tablet 0   Simethicone (GAS-X PO) Take 1  tablet by mouth as needed.     vitamin C (ASCORBIC ACID) 500 MG tablet Take 500 mg by mouth 2 (two) times a week.      No current facility-administered medications for this visit.    PHYSICAL EXAMINATION:. BP (!) 144/78 (BP Location: Left Arm, Patient Position: Sitting) Comment: nurse notified  Pulse 70   Temp 97.7 F (36.5 C) (Temporal)   Resp 16   Ht 5' (1.524 m)   Wt 108 lb (49 kg)   SpO2 100%   BMI 21.09 kg/m   GENERAL:alert, in no acute distress and comfortable SKIN: no acute rashes, no significant lesions EYES: conjunctiva are pink and non-injected, sclera anicteric OROPHARYNX: MMM, no exudates, no oropharyngeal erythema or ulceration NECK: supple, no JVD LYMPH:  no palpable lymphadenopathy in the cervical, axillary or inguinal regions LUNGS: clear to auscultation b/l with normal respiratory effort HEART: regular rate & rhythm ABDOMEN:  normoactive bowel sounds , non tender, not distended. Extremity: no pedal edema PSYCH: alert & oriented x 3 with fluent speech NEURO: no focal motor/sensory deficits   LABORATORY DATA:     Latest Ref Rng & Units 01/19/2023   10:38 AM 12/22/2022    8:30 AM 11/24/2022    9:28 AM  CBC  WBC 4.0 - 10.5 K/uL 3.0  3.4  2.5   Hemoglobin 12.0 - 15.0 g/dL 82.9  56.2  13.0   Hematocrit 36.0 - 46.0 % 35.3  36.0  34.3   Platelets 150 - 400 K/uL 269  317  290       Latest Ref Rng & Units 01/19/2023   10:38 AM 12/22/2022    8:30 AM 11/24/2022    9:28 AM  CMP  Glucose 70 - 99 mg/dL 865  784  696   BUN 8 - 23 mg/dL Creatinine 0.44 - 1.00 mg/dL 2.95  2.84  1.32   Sodium 135 - 145 mmol/L 139  139  139   Potassium 3.5 - 5.1 mmol/L 4.2  4.2  4.4   Chloride 98 - 111 mmol/L 106  106  106   CO2 22 - 32 mmol/L Calcium 8.9 - 10.3 mg/dL 9.3  9.2  8.9   Total Protein 6.5 - 8.1 g/dL 6.9  6.7  6.2   Total Bilirubin 0.3 - 1.2 mg/dL 0.4  0.3  0.4   Alkaline Phos 38 - 126 U/L 28  32  30   AST 15 - 41 U/L 13  12  12    ALT 0 - 44  U/L 11  13  11     Component     Latest Ref Rng 12/22/2022  Alpha 1     0.0 - 0.4 g/dL 0.3 (C)  IgG (Immunoglobin G), Serum     586 - 1,602 mg/dL 161   IgM (Immunoglobulin M), Srm     26 - 217 mg/dL 10 (L)   Globulin, Total     2.2 - 3.9 g/dL 2.7 (C)  IgA     64 - 096 mg/dL 34 (L)   Total Protein ELP     6.0 - 8.5 g/dL 6.4 (C)  Albumin SerPl Elph-Mcnc     2.9 - 4.4 g/dL 3.7 (C)  Alpha2 Glob SerPl Elph-Mcnc     0.4 - 1.0 g/dL 0.7 (C)  B-Globulin SerPl Elph-Mcnc     0.7 - 1.3 g/dL 1.5 (H) (C)  Gamma Glob SerPl Elph-Mcnc     0.4 - 1.8 g/dL 0.2 (L) (C)  M Protein SerPl Elph-Mcnc     Not Observed g/dL 0.9 (H) (C)  Albumin/Glob SerPl     0.7 - 1.7  1.4 (C)  IFE 1 Comment ! (C)  Please Note (HCV): Comment (C)     RADIOGRAPHIC STUDIES: I have personally reviewed the radiological images as listed and agreed with the findings in the report. No results found.   ASSESSMENT & PLAN:   [name] is a 82 y.o. female who presents for a follow up for multiple myeloma.   1. 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  She achieved complete response with negative M protein and negative PET scan in December 2020. -She subsequently went on Ninlaro maintenance therapy -PET scan from May 08, 2021, which showed focal activity within the right femur, consistent with active multiple myeloma, no other hypermetabolic lesion or plasmacytoma. -05/06/2021 bone marrow biopsy, which showed 3% plasma cells. -Recommend Zometa every 2 months.  2. Mild anemia -We will continue to monitor with myeloma treatment and current medication  3.  Cancer related pain  -Currently well controlled -Continue fentanyl patch at 12 mcg/h and Percocet for break through pain.    PLAN: -Discussed lab results from today, 01/19/2023, with the  patient. CBC shows slightly decreased WBC at 3.0, but stable overall. CMP is stable overall.  -Discussed multiple myeloma panel results from 12/22/2022 which showed M protein has increased to 0.9 from 0.4 x1 month prior.,, myeloma panel today shows M spike of 0.7g/dl. -Continue pre-treatment Dexamethasone 12 mg. -No new significant toxicities from a current plan of treatment with Dara/Pom/Dex at this time. -Patient can proceed with cycle 10 of her current Dara/Pom/Dex without any dose modifications. No Dexamethasone post-treatment. -Answered all of patient's questions.  -Continue Zometa every 8 weeks. No new dental issues.   FOLLOW-UP: Per integrated scheduling  The total time spent in the appointment was 30 minutes* .  All of the patient's questions were answered with apparent satisfaction. The patient knows to call the clinic with any problems, questions or concerns.   Wyvonnia Lora MD MS AAHIVMS [facility] Hematology/Oncology Physician [facility]  *Total Encounter Time as defined by the Centers for  Medicare and Medicaid Services includes, in addition to the face-to-face time of a patient visit (documented in the note above) non-face-to-face time: obtaining and reviewing outside history, ordering and reviewing medications, tests or procedures, care coordination (communications with other health care professionals or caregivers) and documentation in the medical record.   I,Alexis Herring,acting as a Neurosurgeon for Wyvonnia Lora, MD.,have documented all relevant documentation on the behalf of Wyvonnia Lora, MD,as directed by  Wyvonnia Lora, MD while in the presence of Wyvonnia Lora, MD.  .I have reviewed the above documentation for accuracy and completeness, and I agree with the above. Johney Maine MD

## 2023-01-22 LAB — MULTIPLE MYELOMA PANEL, SERUM
Albumin SerPl Elph-Mcnc: 3.7 g/dL (ref 2.9–4.4)
Albumin/Glob SerPl: 1.4 (ref 0.7–1.7)
Alpha 1: 0.3 g/dL (ref 0.0–0.4)
Alpha2 Glob SerPl Elph-Mcnc: 0.7 g/dL (ref 0.4–1.0)
B-Globulin SerPl Elph-Mcnc: 1.6 g/dL — ABNORMAL HIGH (ref 0.7–1.3)
Gamma Glob SerPl Elph-Mcnc: 0.2 g/dL — ABNORMAL LOW (ref 0.4–1.8)
Globulin, Total: 2.7 g/dL (ref 2.2–3.9)
IgA: 31 mg/dL — ABNORMAL LOW (ref 64–422)
IgG (Immunoglobin G), Serum: 1122 mg/dL (ref 586–1602)
IgM (Immunoglobulin M), Srm: 9 mg/dL — ABNORMAL LOW (ref 26–217)
M Protein SerPl Elph-Mcnc: 0.7 g/dL — ABNORMAL HIGH
Total Protein ELP: 6.4 g/dL (ref 6.0–8.5)

## 2023-01-25 ENCOUNTER — Encounter: Payer: Self-pay | Admitting: Hematology

## 2023-01-26 ENCOUNTER — Encounter: Payer: Self-pay | Admitting: Internal Medicine

## 2023-01-26 ENCOUNTER — Ambulatory Visit: Payer: Medicare HMO | Admitting: Internal Medicine

## 2023-01-26 VITALS — BP 130/82 | HR 77 | Ht 60.0 in | Wt 106.6 lb

## 2023-01-26 DIAGNOSIS — E890 Postprocedural hypothyroidism: Secondary | ICD-10-CM | POA: Diagnosis not present

## 2023-01-26 DIAGNOSIS — R7303 Prediabetes: Secondary | ICD-10-CM | POA: Diagnosis not present

## 2023-01-26 DIAGNOSIS — C73 Malignant neoplasm of thyroid gland: Secondary | ICD-10-CM

## 2023-01-26 LAB — HEMOGLOBIN A1C: Hgb A1c MFr Bld: 6.3 % (ref 4.6–6.5)

## 2023-01-26 LAB — TSH: TSH: 0.69 u[IU]/mL (ref 0.35–5.50)

## 2023-01-26 LAB — T4, FREE: Free T4: 1.09 ng/dL (ref 0.60–1.60)

## 2023-01-26 NOTE — Progress Notes (Unsigned)
Patient ID: ELENNA SPRATLING, female   DOB: 28-Sep-1941, 82 y.o.   MRN: [id number]   HPI  DELLE ANDRZEJEWSKI is a 82 y.o.-year-old very nice female, initially referred by her PCP, Dr. Durene Cal, returning for follow-up for thyroid cancer, postsurgical hypothyroidism, and prediabetes.  Last visit 1 year ago.  Interim history: Before last visit she had a 12 pound weight loss after chemotherapy for multiple myeloma. She is now undergoing  treatments (immunotherapy  and ChTx) every 4 weeks. She has a little fatigue in the day of the treatment. No increased urination, nausea, chest pain. She has some blurry vision and watery eyes. Uses eye drops.  Papillary thyroid cancer:  Reviewed history: Patient was noticed to have a FDG-avid thyroid nodule on recent PET/CT scans obtained for patient's multiple myeloma.  A thyroid ultrasound that showed 2 nodules meeting criteria for biopsy.  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  She had total thyroidectomy by Dr. Gerrit Friends.  PET/CT (07/17/2019): CHEST: Persistent hypermetabolism in a partially calcified 1.8 cm left thyroid nodule, SUV max 4.5. No hypermetabolic mediastinal, hilar or axillary lymph nodes.  PET/CT (09/14/2019): HEAD/NECK: Calcified 1.5 cm in diameter left thyroid nodule maximum SUV 6.7, previously 7.7.  Thyroid U/S (06/24/2020): Parenchymal Echotexture: Mildly heterogenous Isthmus: Normal in size measuring 0.3 cm in diameter Right lobe: Atrophic in size measuring 3.7 x 1.3 x 1.5 cm Left lobe: Atrophic in size measuring 3.4 x 1.6 x 1.8 cm _________________________________________________________   Estimated total number of nodules >/= 1 cm: 2   Number of spongiform nodules >/=  2 cm not described below (TR1): 0   Number of mixed cystic and solid nodules >/= 1.5 cm not described below (TR2): 0  _________________________________________________________   Scattered punctate (sub 5 mm) anechoic cysts and hypoechoic nodules within the  right lobe of the thyroid, several which contain internal echogenic foci with ring down artifact compatible with benign colloid. None of these punctate nodules meet imaging criteria to recommend percutaneous sampling or continued dedicated follow-up.  ________________________________________________________   Nodule # 1: Location: Left; Mid - this nodule appears hypermetabolic on preceding PET-CT Maximum size: 2.0 cm; Other 2 dimensions: 1.8 x 1.7 cm Composition: cannot determine (2) Echogenicity: cannot determine (1) Shape: not taller-than-wide (0) Margins: lobulated/irregular (2) Echogenic foci: peripheral calcifications (2) Additional echogenic foci 1:  macrocalcifications (1)   **Given size (>/= 1.0 cm) and appearance, fine needle aspiration of this highly suspicious nodule should be considered based on TI-RADS criteria.  _________________________________________________________   Nodule # 2: Location: Left; mid - this nodule appears hypermetabolic on preceding PET-CT Maximum size: 1.2 cm; Other 2 dimensions: 0.9 x 0.7 cm Composition: solid/almost completely solid (2) Echogenicity: hypoechoic (2) Shape: not taller-than-wide (0) Margins: lobulated/irregular (2) Echogenic foci: none (0) *Given size (>/= 1 - 1.4 cm) and appearance, a follow-up ultrasound in 1 year should be considered based on TI-RADS criteria. However - see below: Bx recommended.  _________________________________________________________   There is an additional punctate (approximately 0.5 cm) anechoic cyst within left lobe of the thyroid which does not meet criteria to recommend percutaneous sampling or continued dedicated follow-up.   IMPRESSION: 1. Findings suggestive of multinodular goiter. 2. Nodule #1,correlating with one of hypermetabolic nodules seen on preceding PET-CT, meets imaging criteria to recommend percutaneous sampling as clinically indicated. 3. Nodule #2 meets TIRADS criteria to only  recommend a 1 year follow-up, though as this nodule also appears hypermetabolic on preceding PET-CT, percutaneous sampling is also recommended for this nodule as  hypermetabolic nodules have a higher rate of harboring malignancy. 4. None of the remaining thyroid nodules/cysts meet imaging criteria to recommend percutaneous sampling or continued dedicated follow-up.  FNAs (07/10/2020): Specimen Submitted:  A. THYROID,LEFT MID #2, FINE NEEDLE ASPIRATION:  FINAL MICROSCOPIC DIAGNOSIS:  - Findings consistent with papillary carcinoma (Bethesda category VI)  SPECIMEN ADEQUACY:  Satisfactory for evaluation   Specimen Submitted:  A. THYROID,LEFT MID #1, FINE NEEDLE ASPIRATION:  FINAL MICROSCOPIC DIAGNOSIS:  - Findings consistent with papillary carcinoma (Bethesda category VI)  SPECIMEN ADEQUACY:  Satisfactory for evaluation  Total thyroidectomy (08/02/2020): A. THYROID, TOTAL THYROIDECTOMY:  - Papillary thyroid carcinoma, classical variant, at least 3 cm  - Portion of the tumor shows hyaline fibrosis and calcification  - Anterior surface resection margin is involved by tumor  - Definite evidence of extrathyroidal extension is not identified  - Negative for lymphovascular invasion   THYROID GLAND:   Procedure: Total thyroidectomy  Tumor Focality: Unifocal  Tumor Site: Mid and inferior pole of left lobe  Tumor Size: At least 3 cm  Histologic Type: Papillary thyroid carcinoma, classical variant  Margins: The anterior surface margin is involved by carcinoma  Angioinvasion: Not identified  Lymphatic Invasion: Not identified  Extrathyroidal extension: Not identified  Regional Lymph Nodes: No lymph nodes submitted or found  Pathologic Stage Classification (pTNM, AJCC 8th Edition): pT2, pN not  assigned (no nodes submitted or found)  Comment(s): Though 2 separate lesions were described grossly, they  appeared to be part of a single tumor with prominent fibrosis and calcification of a portion  of the tumor.    Neck U/S (02/24/2022): The thyroid gland is surgically absent. No abnormal soft tissue identified in the surgical bed. No pathologically enlarged lymph nodes identified in the adjacent soft tissues of the neck.   IMPRESSION: Status post total thyroidectomy. No findings of locally recurrent disease.  Lab Results  Component Value Date   THYROGLB 1.0 (L) 01/28/2022   THYROGLB 1.0 (L) 06/30/2021   THYROGLB 0.4 (L) 09/05/2020   Lab Results  Component Value Date   THGAB <1 01/28/2022   THGAB <1 06/30/2021   THGAB <1 09/05/2020   Pt denies: - feeling nodules in neck - hoarseness - dysphagia - choking  She is on levothyroxine 88 mcg daily: - in am (5:30 am) - fasting - at least 1h  from b'fast - off calcium - no iron - + multivitamins at lunchtime - no PPIs - not on Biotin - + slow Mag with calcium at dinner Dexamethasone  - last 1 week ago: 12 mg po now - the day of infusion.  Reviewed her TFTs: Lab Results  Component Value Date   TSH 1.50 01/28/2022   TSH 2.74 08/11/2021   TSH 7.50 (H) 06/30/2021   TSH 3.17 03/17/2021   TSH 1.96 10/30/2020   TSH 0.56 09/05/2020   TSH 1.006 09/19/2019   TSH 1.84 03/25/2016   TSH 1.47 03/22/2015   TSH 1.58 03/19/2014   FREET4 0.95 01/28/2022   FREET4 0.77 06/30/2021   FREET4 0.86 03/17/2021   FREET4 0.90 10/30/2020   FREET4 1.04 09/05/2020   FREET4 1.10 09/19/2019    She has family history of thyroid disease: Hashimoto's thyroidism in sister. No FH of thyroid cancer. No h/o radiation tx to head or neck. No herbal supplements. No Biotin use.   Prediabetes:  Reviewed HbA1c levels: Lab Results  Component Value Date   HGBA1C 5.0 01/28/2022   HGBA1C 5.9 (A) 01/13/2021   HGBA1C 5.9 (A) 06/18/2020  HGBA1C 5.8 (H) 01/26/2020   HGBA1C 5.6 10/10/2018   HGBA1C 6.3 04/04/2018   HGBA1C 5.9 09/17/2017   HGBA1C 6.2 03/26/2017   HGBA1C 5.9 03/22/2015   She does not check blood sugars at home.  + HL: Lab  Results  Component Value Date   CHOL 220 (H) 01/26/2020   HDL 68 01/26/2020   LDLCALC 132 (H) 01/26/2020   TRIG 97 01/26/2020   CHOLHDL 3.2 01/26/2020  She is not on a statin.  Review most recent kidney function: Lab Results  Component Value Date   BUN 19 01/19/2023   Lab Results  Component Value Date   CREATININE 0.69 01/19/2023  She is not on ACE inhibitor or ARB.  She takes an aspirin 81 mg daily.  She has frequent [facility] exams at [facility], to follow her glaucoma: No DR  She has a history of multiple myeloma, which is now in remission.   Also, history of multilevel thoracic vertebral fractures.  ROS: + see HPI  I reviewed pt's medications, allergies, PMH, social hx, family hx, and changes were documented in the history of present illness. Otherwise, unchanged from my initial visit note.  Past Medical History:  Diagnosis Date   Anemia    Glaucoma    HYPERTENSION 03/11/2007   Multiple myeloma (HCC)    OSTEOPENIA 03/11/2007   Pre-diabetes    Thyroid cancer (HCC)    Thyroid disease    Tubular adenoma of colon 07/2015   Past Surgical History:  Procedure Laterality Date   BONE MARROW BIOPSY     multiple   BREAST EXCISIONAL BIOPSY Right 2000   BREAST LUMPECTOMY  1990   benign   CATARACT EXTRACTION Bilateral 2018   DILATION AND CURETTAGE OF UTERUS     bleeding at menopause. No uterine cancer   IR IMAGING GUIDED PORT INSERTION  05/16/2021   IR RADIOLOGIST EVAL & MGMT  12/13/2018   THYROIDECTOMY N/A 08/02/2020   Procedure: TOTAL THYROIDECTOMY;  Surgeon: Darnell Level, MD;  Location: [facility];  Service: General;  Laterality: N/A;   TONSILLECTOMY     age 20   Social History   Socioeconomic History   Marital status: Married    Spouse name: Not on file   Number of children: 1   Years of education: Not on file   Highest education level: Not on file  Occupational History   Occupation: Retired  Tobacco Use   Smoking status: Former    Packs/day: 0.50    Years: 7.00     Additional pack years: 0.00    Total pack years: 3.50    Types: Cigarettes    Quit date: 01/04/1961    Years since quitting: 62.1   Smokeless tobacco: Never  Vaping Use   Vaping Use: Never used  Substance and Sexual Activity   Alcohol use: Not Currently    Alcohol/week: 1.0 standard drink of alcohol    Types: 1 Standard drinks or equivalent per week    Comment: occas    Drug use: No   Sexual activity: Not Currently  Other Topics Concern   Not on file  Social History Narrative   Married. Lives with husband (patient of Dr. Durene Cal). 1 son. No grandkids. 1 granddog.       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, swing dancing, dinner, read      Social Determinants of Corporate investment banker Strain: Low  Risk  (12/07/2022)   Overall Financial Resource Strain (CARDIA)    Difficulty of Paying Living Expenses: Not very hard  Food Insecurity: No Food Insecurity (12/07/2022)   Hunger Vital Sign    Worried About Running Out of Food in the Last Year: Never true    Ran Out of Food in the Last Year: Never true  Transportation Needs: No Transportation Needs (10/28/2021)   PRAPARE - Administrator, Civil Service (Medical): No    Lack of Transportation (Non-Medical): No  Physical Activity: Inactive (10/28/2021)   Exercise Vital Sign    Days of Exercise per Week: 0 days    Minutes of Exercise per Session: 0 min  Stress: No Stress Concern Present (10/28/2021)   [employer] of Occupational Health - Occupational Stress Questionnaire    Feeling of Stress : Only a little  Social Connections: Moderately Integrated (10/28/2021)   Social Connection and Isolation Panel [NHANES]    Frequency of Communication with Friends and Family: More than three times a week    Frequency of Social Gatherings with Friends and Family: More than three times a week    Attends Religious Services: More than 4 times per  year    Active Member of [employer] or Organizations: No    Attends Banker Meetings: Never    Marital Status: Married  Catering manager Violence: Not At Risk (10/28/2021)   Humiliation, Afraid, Rape, and Kick questionnaire    Fear of Current or Ex-Partner: No    Emotionally Abused: No    Physically Abused: No    Sexually Abused: No   Current Outpatient Medications on File Prior to Visit  Medication Sig Dispense Refill   acyclovir (ZOVIRAX) 400 MG tablet Take 1 tablet (400 mg total) by mouth 2 (two) times daily. 60 tablet 3   ALPHAGAN P 0.1 % SOLN Place 1 drop into both eyes in the morning, at noon, and at bedtime.      amLODipine (NORVASC) 10 MG tablet Take 1 tablet (10 mg total) by mouth daily. 90 tablet 3   aspirin EC 81 MG tablet Take 81 mg by mouth daily.     Cholecalciferol (VITAMIN D3) 125 MCG (5000 UT) TABS Take 5,000 Units by mouth daily.     Co-Enzyme Q-10 100 MG CAPS Take 100 mg by mouth daily.     dexamethasone (DECADRON) 4 MG tablet Take 4 tablets (16 mg total) by mouth as directed. Take one hour prior to monthly Darzalex infusion. 12 tablet 3   dorzolamide-timolol (COSOPT) 22.3-6.8 MG/ML ophthalmic solution Place 1 drop into both eyes 2 (two) times daily.   11   fentaNYL (DURAGESIC) 12 MCG/HR Place 1 patch onto the skin every 3 (three) days. 10 patch 0   levothyroxine (SYNTHROID) 88 MCG tablet Take 1 tablet (88 mcg total) by mouth daily before breakfast. 90 tablet 3   lidocaine-prilocaine (EMLA) cream Apply to affected area once 30 g 3   LUMIGAN 0.01 % SOLN 1 drop at bedtime. (Patient not taking: Reported on 07/08/2022)     magnesium chloride (SLOW-MAG) 64 MG TBEC SR tablet Take by mouth.     Multiple Vitamins-Minerals (MULTIVITAMIN WITH MINERALS) tablet Take 1 tablet by mouth daily. Centrum Silver 50 +     Netarsudil-Latanoprost (ROCKLATAN) 0.02-0.005 % SOLN Place 1 drop into both eyes daily.     Omega-3 1000 MG CAPS Take 1,000 mg by mouth daily.      ondansetron  (ZOFRAN) 8 MG tablet Take 1 tablet (  8 mg total) by mouth every 8 (eight) hours as needed for nausea or vomiting. 30 tablet 1   oxyCODONE-acetaminophen (PERCOCET) 5-325 MG tablet Take 1-2 tablets by mouth every 4 (four) hours as needed for moderate pain or severe pain. 90 tablet 0   polyethylene glycol (MIRALAX) packet Take 17 g by mouth daily. 30 each 1   pomalidomide (POMALYST) 2 MG capsule Take 1 capsule (2 mg total) by mouth daily. Take for 21 days on, 7 days off, repeat every 28 days 21 capsule 0   prochlorperazine (COMPAZINE) 10 MG tablet Take 1 tablet (10 mg total) by mouth every 6 (six) hours as needed for nausea or vomiting. 30 tablet 1   senna-docusate (SENOKOT-S) 8.6-50 MG tablet Take 2 tablets by mouth at bedtime. 60 tablet 0   Simethicone (GAS-X PO) Take 1 tablet by mouth as needed.     vitamin C (ASCORBIC ACID) 500 MG tablet Take 500 mg by mouth 2 (two) times a week.      No current facility-administered medications on file prior to visit.   Allergies  Allergen Reactions   Ace Inhibitors Cough   Benadryl [Diphenhydramine] Other (See Comments)    Heart races   Diamox [Acetazolamide]     Hypotensive event at ophthalmologist   Sulfamethoxazole     REACTION: rash   Lenalidomide Rash   Penicillins Rash    REACTION: rash Did it involve swelling of the face/tongue/throat, SOB, or low BP? Yes Did it involve sudden or severe rash/hives, skin peeling, or any reaction on the inside of your mouth or nose? No Did you need to seek medical attention at a hospital or doctor's office? No When did it last happen?      more than 10 years ago If all above answers are "NO", may proceed with cephalosporin use.    Family History  Problem Relation Age of Onset   Heart disease Mother        CHF mother died 66   Arthritis Mother    Glaucoma Mother        sister as well   Alcohol abuse Father    Suicidality Father    Heart disease Sister        aortic valve replacement   Lung cancer Sister         smoker   Hyperlipidemia Brother    Hypertension Brother    COPD Brother    Colon polyps Brother    Arthritis Sister    Hypertension Sister    Glaucoma Sister    Hashimoto's thyroiditis Sister    Colon polyps Sister    Hypertension Son    Stroke Maternal Grandmother    Cystic fibrosis Niece    Colon cancer Neg Hx    PE: BP 130/82 (BP Location: Left Arm, Patient Position: Sitting, Cuff Size: Normal)   Pulse 77   Ht 5' (1.524 m)   Wt 106 lb 9.6 oz (48.4 kg)   SpO2 99%   BMI 20.82 kg/m  Wt Readings from Last 3 Encounters:  01/26/23 106 lb 9.6 oz (48.4 kg)  01/19/23 108 lb (49 kg)  12/22/22 108 lb 3.2 oz (49.1 kg)   Constitutional: Thin, in NAD Eyes:  EOMI, no exophthalmos ENT: no neck masses, thyroidectomy scar healed, no cervical lymphadenopathy Cardiovascular: RRR, No MRG Respiratory: CTA B Musculoskeletal: no deformities Skin:no rashes Neurological: no tremor with outstretched hands  ASSESSMENT: 1.  Papillary thyroid cancer, classic variant  2.  Postsurgical hypothyroidism  3.  Prediabetes  PLAN: 1. Thyroid cancer - papillary -Patient with history of clinically stage I TNM thyroid cancer, with lower clinical risk due to tumor size smaller than 4 cm, no lymphovascular invasion, no atypical features, except for few calcifications and hyalinization, possibly associated with slightly higher risk, and without extrathyroidal extension.  -We discussed that PTC is a cancer with good prognosis, and her life expectancy and quality of life were unlikely to be affected by the cancer -Since her cancer was low-intermediate risk, RAI treatment for postop thyroid remnant ablation was not mandatory.  We decided to follow her without RAI treatment, especially as she was on the treatment plan for multiple myeloma.  We did discuss that if we decide to perform RAI treatment later in the disease, this will likely not affect her prognosis. -We discussed that since she did not have  RAI treatment, her thyroglobulin levels may not become undetectable.  At low visit, level was stable, low, at 1.0.  ATA antibodies are undetectable. -a PET scan (12/04/2021) did not show any increased uptake in the neck.  -At last visit we checked her neck ultrasound, first after surgery.  There were no signs of recurrence or or abnormal masses. -we will recheck her thyroglobulin level today -I will see the patient in 1 year  2.  Patient with history of total thyroidectomy for thyroid cancer, now with iatrogenic hypothyroidism, on levothyroxine therapy - latest thyroid labs reviewed with pt. >> normal: Lab Results  Component Value Date   TSH 1.50 01/28/2022  - she continues on LT4 88 mcg daily - pt feels good on this dose. - we discussed about taking the thyroid hormone every day, with water, >30 minutes before breakfast, separated by >4 hours from acid reflux medications, calcium, iron, multivitamins. Pt. is taking it correctly. - will check thyroid tests today: TSH and fT4 - If labs are abnormal, she will need to return for repeat TFTs in 1.5 months  2.  Prediabetes -Latest HbA1c was much better: Lab Results  Component Value Date   HGBA1C 5.0 01/28/2022   HGBA1C 5.9 (A) 01/13/2021   HGBA1C 5.9 (A) 06/18/2020  -Will repeat her HbA1c today -Will continue to follow her without medications for now  Needs refills LT4.  Carlus Pavlov, MD PhD [facility]

## 2023-01-26 NOTE — Patient Instructions (Signed)
Please continue: - Levothyroxine 88 mcg daily.  Take the thyroid hormone every day, with water, at least 30 minutes before breakfast, separated by at least 4 hours from: - acid reflux medications - calcium - iron - multivitamins  Please stop at the lab.  Please return in 1 year. 

## 2023-01-27 ENCOUNTER — Other Ambulatory Visit: Payer: Self-pay

## 2023-01-27 LAB — THYROGLOBULIN ANTIBODY: Thyroglobulin Ab: <1 IU/mL (ref <=1)

## 2023-01-28 ENCOUNTER — Telehealth: Payer: Self-pay | Admitting: Family Medicine

## 2023-01-28 LAB — THYROGLOBULIN LEVEL: Thyroglobulin: 0.3 ng/mL — ABNORMAL LOW

## 2023-01-28 MED ORDER — LEVOTHYROXINE SODIUM 88 MCG PO TABS
88.0000 ug | ORAL_TABLET | Freq: Every day | ORAL | 3 refills | Status: DC
Start: 2023-01-28 — End: 2024-01-28

## 2023-01-28 NOTE — Telephone Encounter (Signed)
Called patient to schedule Medicare Annual Wellness Visit (AWV). Left message for patient to call back and schedule Medicare Annual Wellness Visit (AWV).  Last date of AWV: 10/28/2021  Please schedule an appointment at any time with Inetta Fermo, [facility]. Please schedule AWVS with Inetta Fermo, [facility].  If any questions, please contact me at [phone].  Thank you ,  Gabriel Cirri [facility] AWV TEAM Direct Dial [phone]

## 2023-02-01 ENCOUNTER — Telehealth: Payer: Self-pay | Admitting: Family Medicine

## 2023-02-01 DIAGNOSIS — H5203 Hypermetropia, bilateral: Secondary | ICD-10-CM | POA: Diagnosis not present

## 2023-02-01 DIAGNOSIS — H524 Presbyopia: Secondary | ICD-10-CM | POA: Diagnosis not present

## 2023-02-01 NOTE — Telephone Encounter (Signed)
Copied from CRM [id number]. Topic: Medicare AWV >> Feb 01, 2023  9:38 AM Gwenith Spitz wrote: Reason for CRM: Called patient to schedule Medicare Annual Wellness Visit (AWV). Left message for patient to call back and schedule Medicare Annual Wellness Visit (AWV).  Last date of AWV: 10/28/2021  Please schedule an appointment at any time with Inetta Fermo, [facility]. Please schedule AWVS with Inetta Fermo, [facility].  If any questions, please contact me at [phone].  Thank you ,  Gabriel Cirri [facility] AWV TEAM Direct Dial [phone]

## 2023-02-02 ENCOUNTER — Other Ambulatory Visit: Payer: Self-pay

## 2023-02-02 ENCOUNTER — Telehealth: Payer: Self-pay | Admitting: Family Medicine

## 2023-02-02 DIAGNOSIS — C9002 Multiple myeloma in relapse: Secondary | ICD-10-CM

## 2023-02-02 MED ORDER — SENNOSIDES-DOCUSATE SODIUM 8.6-50 MG PO TABS
2.0000 | ORAL_TABLET | Freq: Every day | ORAL | 0 refills | Status: DC
Start: 2023-02-02 — End: 2023-03-04

## 2023-02-02 NOTE — Telephone Encounter (Signed)
Contacted Megan Olson to schedule their annual wellness visit. Appointment made for 02/15/2023.  Gabriel Cirri [facility] AWV TEAM Direct Dial [phone]

## 2023-02-03 ENCOUNTER — Other Ambulatory Visit: Payer: Self-pay

## 2023-02-03 DIAGNOSIS — Z7189 Other specified counseling: Secondary | ICD-10-CM

## 2023-02-03 DIAGNOSIS — C9002 Multiple myeloma in relapse: Secondary | ICD-10-CM

## 2023-02-03 MED ORDER — POMALIDOMIDE 2 MG PO CAPS
2.0000 mg | ORAL_CAPSULE | Freq: Every day | ORAL | 0 refills | Status: DC
Start: 2023-02-03 — End: 2023-03-04

## 2023-02-04 ENCOUNTER — Encounter: Payer: Medicare HMO | Admitting: Family Medicine

## 2023-02-05 ENCOUNTER — Other Ambulatory Visit (HOSPITAL_COMMUNITY): Payer: Self-pay

## 2023-02-08 ENCOUNTER — Ambulatory Visit (INDEPENDENT_AMBULATORY_CARE_PROVIDER_SITE_OTHER): Payer: Medicare HMO | Admitting: Family Medicine

## 2023-02-08 ENCOUNTER — Encounter: Payer: Self-pay | Admitting: Family Medicine

## 2023-02-08 VITALS — BP 126/72 | HR 55 | Temp 98.3°F | Resp 16 | Ht 60.0 in | Wt 106.8 lb

## 2023-02-08 DIAGNOSIS — I10 Essential (primary) hypertension: Secondary | ICD-10-CM

## 2023-02-08 DIAGNOSIS — E041 Nontoxic single thyroid nodule: Secondary | ICD-10-CM

## 2023-02-08 DIAGNOSIS — C73 Malignant neoplasm of thyroid gland: Secondary | ICD-10-CM | POA: Diagnosis not present

## 2023-02-08 DIAGNOSIS — C9000 Multiple myeloma not having achieved remission: Secondary | ICD-10-CM | POA: Diagnosis not present

## 2023-02-08 DIAGNOSIS — Z0000 Encounter for general adult medical examination without abnormal findings: Secondary | ICD-10-CM | POA: Diagnosis not present

## 2023-02-08 DIAGNOSIS — R739 Hyperglycemia, unspecified: Secondary | ICD-10-CM

## 2023-02-08 NOTE — Progress Notes (Signed)
Phone [phone]   Subjective:  Patient presents today for their annual physical. Chief complaint-noted.   See problem oriented charting- ROS- full  review of systems was completed and negative except for: runny nose occasionally, eye itching, slight cough- thinks these issues are related to pollen, and light sensitivity with glaucoma, urinary urgency occasional, joint pain in shoulder and back pain related to multiple myeloma  The following were reviewed and entered/updated in epic: Past Medical History:  Diagnosis Date   Anemia    Glaucoma    HYPERTENSION 03/11/2007   Multiple myeloma (HCC)    OSTEOPENIA 03/11/2007   Pre-diabetes    Thyroid cancer (HCC)    Thyroid disease    Tubular adenoma of colon 07/2015   Patient Active Problem List   Diagnosis Date Noted   Port-A-Cath in place 01/21/2022    Priority: High   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) 04/16/2021    Priority: High   Papillary thyroid carcinoma (HCC) 08/01/2020    Priority: High   Left thyroid nodule 06/18/2020    Priority: High   Multiple myeloma not having achieved remission (HCC) 12/12/2018    Priority: High   Prediabetes 06/18/2020    Priority: Medium    Pain in thoracic spine 11/03/2018    Priority: Medium    Primary open angle glaucoma (POAG) of right eye, moderate stage 10/11/2017    Priority: Medium    Hyperglycemia 03/29/2015    Priority: Medium    Hyperlipidemia 10/18/2014    Priority: Medium    Essential hypertension 03/11/2007    Priority: Medium    Counseling regarding advance care planning and goals of care 12/12/2018    Priority: Low   History of adenomatous polyp of colon 07/23/2015    Priority: Low   Raynaud phenomenon 10/19/2014    Priority: Low   Syncope 10/19/2014    Priority: Low   Glaucoma 01/05/2011    Priority: Low   Osteoporosis 03/11/2007    Priority: Low   Combined forms of age-related cataract of left eye 04/28/2017   Past Surgical History:  Procedure Laterality Date    BONE MARROW BIOPSY     multiple   BREAST EXCISIONAL BIOPSY Right 2000   BREAST LUMPECTOMY  1990   benign   CATARACT EXTRACTION Bilateral 2018   DILATION AND CURETTAGE OF UTERUS     bleeding at menopause. No uterine cancer   IR IMAGING GUIDED PORT INSERTION  05/16/2021   IR RADIOLOGIST EVAL & MGMT  12/13/2018   THYROIDECTOMY N/A 08/02/2020   Procedure: TOTAL THYROIDECTOMY;  Surgeon: Darnell Level, MD;  Location: [facility];  Service: General;  Laterality: N/A;   TONSILLECTOMY     age 53    Family History  Problem Relation Age of Onset   Heart disease Mother        CHF mother died 6   Arthritis Mother    Glaucoma Mother        sister as well   Alcohol abuse Father    Suicidality Father    Heart disease Sister        aortic valve replacement   Lung cancer Sister        smoker   Hyperlipidemia Brother    Hypertension Brother    COPD Brother    Colon polyps Brother    Arthritis Sister    Hypertension Sister    Glaucoma Sister    Hashimoto's thyroiditis Sister    Colon polyps Sister    Hypertension Son  Stroke Maternal Grandmother    Cystic fibrosis Niece    Colon cancer Neg Hx     Medications- reviewed and updated Current Outpatient Medications  Medication Sig Dispense Refill   acyclovir (ZOVIRAX) 400 MG tablet Take 1 tablet (400 mg total) by mouth 2 (two) times daily. 60 tablet 3   ALPHAGAN P 0.1 % SOLN Place 1 drop into both eyes in the morning, at noon, and at bedtime.      amLODipine (NORVASC) 10 MG tablet Take 1 tablet (10 mg total) by mouth daily. 90 tablet 3   aspirin EC 81 MG tablet Take 81 mg by mouth daily.     Cholecalciferol (VITAMIN D3) 125 MCG (5000 UT) TABS Take 5,000 Units by mouth daily.     Co-Enzyme Q-10 100 MG CAPS Take 100 mg by mouth daily.     dexamethasone (DECADRON) 4 MG tablet Take 4 tablets (16 mg total) by mouth as directed. Take one hour prior to monthly Darzalex infusion. 12 tablet 3   dorzolamide-timolol (COSOPT) 22.3-6.8 MG/ML ophthalmic  solution Place 1 drop into both eyes 2 (two) times daily.   11   fentaNYL (DURAGESIC) 12 MCG/HR Place 1 patch onto the skin every 3 (three) days. 10 patch 0   levothyroxine (SYNTHROID) 88 MCG tablet Take 1 tablet (88 mcg total) by mouth daily before breakfast. 90 tablet 3   lidocaine-prilocaine (EMLA) cream Apply to affected area once 30 g 3   LUMIGAN 0.01 % SOLN 1 drop at bedtime.     magnesium chloride (SLOW-MAG) 64 MG TBEC SR tablet Take by mouth.     Multiple Vitamins-Minerals (MULTIVITAMIN WITH MINERALS) tablet Take 1 tablet by mouth daily. Centrum Silver 50 +     Netarsudil-Latanoprost (ROCKLATAN) 0.02-0.005 % SOLN Place 1 drop into both eyes daily.     Omega-3 1000 MG CAPS Take 1,000 mg by mouth daily.      ondansetron (ZOFRAN) 8 MG tablet Take 1 tablet (8 mg total) by mouth every 8 (eight) hours as needed for nausea or vomiting. 30 tablet 1   oxyCODONE-acetaminophen (PERCOCET) 5-325 MG tablet Take 1-2 tablets by mouth every 4 (four) hours as needed for moderate pain or severe pain. 90 tablet 0   polyethylene glycol (MIRALAX) packet Take 17 g by mouth daily. 30 each 1   pomalidomide (POMALYST) 2 MG capsule Take 1 capsule (2 mg total) by mouth daily. Take for 21 days on, 7 days off, repeat every 28 days 21 capsule 0   prochlorperazine (COMPAZINE) 10 MG tablet Take 1 tablet (10 mg total) by mouth every 6 (six) hours as needed for nausea or vomiting. 30 tablet 1   senna-docusate (SENOKOT-S) 8.6-50 MG tablet Take 2 tablets by mouth at bedtime. 60 tablet 0   Simethicone (GAS-X PO) Take 1 tablet by mouth as needed.     vitamin C (ASCORBIC ACID) 500 MG tablet Take 500 mg by mouth 2 (two) times a week.      No current facility-administered medications for this visit.    Allergies-reviewed and updated Allergies  Allergen Reactions   Ace Inhibitors Cough   Benadryl [Diphenhydramine] Other (See Comments)    Heart races   Diamox [Acetazolamide]     Hypotensive event at ophthalmologist    Sulfamethoxazole     REACTION: rash   Lenalidomide Rash   Penicillins Rash    REACTION: rash Did it involve swelling of the face/tongue/throat, SOB, or low BP? Yes Did it involve sudden or severe rash/hives, skin peeling,  or any reaction on the inside of your mouth or nose? No Did you need to seek medical attention at a hospital or doctor's office? No When did it last happen?      more than 10 years ago If all above answers are "NO", may proceed with cephalosporin use.     Social History   Social History Narrative   Married. Lives with husband (patient of Dr. Durene Cal). 1 son. No grandkids. 1 granddog.       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 Oropharynx normal Neck: no thyromegaly CV: RRR no murmurs rubs or gallops Lungs: CTAB no crackles, wheeze, rhonchi Abdomen: soft/nontender/nondistended/normal bowel sounds. No rebound or guarding.  Ext: no edema Skin: warm, dry, 2 smaller sebaceous cysts on back - less than 1.5 cm with central head/core to them- she reports not enlarging Neuro: grossly normal, moves all extremities, PERRLA   Assessment and Plan   82 y.o. female presenting for annual physical.  Health Maintenance counseling: 1. Anticipatory guidance: Patient counseled regarding regular dental exams -q6 months, eye exams-yearly but more due to glaucoma,  avoiding smoking and second hand smoke , limiting alcohol to 1 beverage per day-extremely sparing , no illicit drugs.   2. Risk factor reduction:  Advised patient of need for regular exercise and diet rich and fruits and vegetables  to reduce risk of heart attack and stroke.  Exercise-still walking most days- still mowing lawn and vacuuming as well.  Diet/weight management-encouraged by 10 pounds weight gain in the last year- thrilled with progress.  Wt Readings from Last 3 Encounters:  02/08/23 106 lb 12.8 oz (48.4 kg)  01/26/23 106 lb 9.6 oz (48.4 kg)  01/19/23 108 lb (49 kg)  3. Immunizations/screenings/ancillary studies-counseled off on COVID-19 vaccination until the fall- she may ask Dr. Candise Che about doing another one, discussed Tdap at pharmacy  Immunization History  Administered Date(s) Administered   Fluad Quad(high Dose 65+) 06/13/2019, 07/03/2020, 06/17/2021, 06/25/2022   Influenza Split 07/19/2012   Influenza Whole 07/17/2008, 06/28/2009, 06/25/2010   Influenza, High Dose Seasonal PF 07/22/2016, 06/24/2017, 06/23/2018, 08/10/2022   Influenza,inj,Quad PF,6+ Mos 06/27/2013, 06/18/2014   Influenza-Unspecified 07/03/2015   PFIZER(Purple Top)SARS-COV-2 Vaccination 11/09/2019, 12/04/2019, 06/03/2020, 01/09/2021, 07/04/2021, 07/03/2022   PNEUMOCOCCAL CONJUGATE-20 05/21/2021   Pneumococcal Conjugate-13 03/29/2015   Pneumococcal Polysaccharide-23 04/04/2006   Respiratory Syncytial Virus Vaccine,Recomb Aduvanted(Arexvy) 08/10/2022   Td 02/10/2010   Zoster Recombinat (Shingrix) 03/27/2020, 09/06/2020   Zoster, Live 09/26/2008   4. Cervical cancer screening-past age based screening recommendations - no vaginal bleeding or discharge  5. Breast cancer screening-  breast exam-prefer self exams  and mammogram-opts out of repeat  6. Colon cancer screening - 07/16/15 with no repeats-due to multiple myeloma we have opted to hold off on repeat previously- has talked to Dr. Russella Dar she believes last fall and he does not recommend repeat. Mild incontinence- working on [employer]- not bad  7. Skin cancer screening-saw Dr. Emily Filbert x 1- lesion removed and was benign thankfully benign about 2 years ago-  she may call back for a general check .  advised regular sunscreen use. Denies worrisome, changing, or new skin lesions.  8. Birth control/STD check-monogamous/postmenopausal   9. 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 positive margin-followed by Dr. Leland Johns without RAI # Postsurgical hypothyroidism S: compliant On thyroid medication-levothyroxine 88 mcg Lab Results  Component Value Date   TSH 0.69 01/26/2023  A/P:postsurgical hypothyroidism stable- continue current medicines  Papillary hyroid carcinoma unde appropriate monitoring    #hypertension S: medication: Amlodipine 10 mg Home readings #s: high in offie but home #s look great once again  BP Readings from Last 3 Encounters:  02/08/23 126/72  01/26/23 130/82  01/19/23 126/70  A/P: stable- continue current medicines   #hyperlipidemia S: Medication:none The ASCVD Risk score (Arnett DK, et al., 2019) failed to calculate for the following reasons:   The 2019 ASCVD risk score is only valid for ages 5 to 2  Lab Results  Component Value Date   CHOL 220 (H) 01/26/2020   HDL 68 01/26/2020   LDLCALC 132 (H) 01/26/2020   TRIG 97 01/26/2020   CHOLHDL 3.2 01/26/2020   A/P: opts out of lipid panel for now   #steroid induced hyperglycemia- monitor a1c on dexamethasone - discussed thankful for weight gain Lab Results  Component Value Date   HGBA1C 6.3 01/26/2023   HGBA1C 5.0  01/28/2022   HGBA1C 5.9 (A) 01/13/2021   #right Shoulder pain- starts in her shoulder and goes to the right elbow. Tends to come and go- Dr. Candise Che aware- wants to monitor for now but will update him on 14th when she sees him next.   #skin lesions- one hematology  think is a cyst and the other also likely cyct- also has some skin tags- we will monitor  Recommended follow up: Return in about 1 year (around 02/08/2024) for physical or sooner if needed.Schedule b4 you leave. Future Appointments  Date Time Provider Department Center  02/15/2023  8:30 AM [facility] ANNUAL WELLNESS VISIT 1 [facility]  02/16/2023 12:00 PM [facility] FLUSH [facility] None  02/16/2023 12:30 PM Johney Maine, MD [facility] None  02/16/2023  1:30 PM [facility] INFUSION [facility] None  03/16/2023 10:00 AM [facility] FLUSH [facility] None  03/16/2023 10:30 AM Johney Maine, MD [facility] None  03/16/2023 11:30 AM [facility] INFUSION [facility] None  01/26/2024  9:00 AM Carlus Pavlov, MD [facility] None   Lab/Order associations: NOT fasting but no labs today   ICD-10-CM   1. Preventative health care  Z00.00     2. Multiple myeloma not having achieved remission (HCC)  C90.00     3. Papillary thyroid carcinoma (HCC)  C73     4. Left thyroid nodule  E04.1     5. Essential hypertension  I10 Lipid panel    6. Hyperglycemia  R73.9       No orders of the defined types were placed in this encounter.   Return precautions advised.  Garret Reddish, MD

## 2023-02-08 NOTE — Patient Instructions (Addendum)
Chat with Dr. Candise Che about COVID shot- we have one on file 07/03/22 which should have been the updated vaccine- I would lean toward one more if you have not had the updated vaccine  Also due for Tetanus, Diphtheria, and Pertussis (Tdap)  -cheaper at pharmacy  If possible with next labs see if they can add a lipid panel just to have on file but honestly im highly unlikely to start medicine for this without heart attack or stroke history -I did add future lipid panel if they can use it  Recommended follow up: Return in about 1 year (around 02/08/2024) for physical or sooner if needed.Schedule b4 you leave.

## 2023-02-09 ENCOUNTER — Other Ambulatory Visit: Payer: Self-pay

## 2023-02-11 ENCOUNTER — Other Ambulatory Visit: Payer: Self-pay

## 2023-02-11 DIAGNOSIS — C9002 Multiple myeloma in relapse: Secondary | ICD-10-CM

## 2023-02-15 ENCOUNTER — Ambulatory Visit (INDEPENDENT_AMBULATORY_CARE_PROVIDER_SITE_OTHER): Payer: Medicare HMO

## 2023-02-15 ENCOUNTER — Other Ambulatory Visit: Payer: Self-pay | Admitting: *Deleted

## 2023-02-15 VITALS — Wt 106.0 lb

## 2023-02-15 DIAGNOSIS — C9000 Multiple myeloma not having achieved remission: Secondary | ICD-10-CM

## 2023-02-15 DIAGNOSIS — Z0000 Encounter for general adult medical examination without abnormal findings: Secondary | ICD-10-CM

## 2023-02-15 NOTE — Progress Notes (Signed)
I connected with  Corrinne Eagle on 02/15/23 by a audio enabled telemedicine application and verified that I am speaking with the correct person using two identifiers.  Patient Location: Home  Provider Location: Office/Clinic  I discussed the limitations of evaluation and management by telemedicine. The patient expressed understanding and agreed to proceed.   Subjective:   Megan Olson is a 82 y.o. female who presents for Medicare Annual (Subsequent) preventive examination.  Review of Systems     Cardiac Risk Factors include: advanced age (>43men, >74 women);dyslipidemia;hypertension     Objective:    Today's Vitals   02/15/23 0838  Weight: 106 lb (48.1 kg)   Body mass index is 20.7 kg/m.     02/15/2023    8:44 AM 07/21/2022    9:17 AM 06/16/2022    8:56 AM 06/02/2022   10:23 AM 05/26/2022   10:13 AM 04/29/2022    9:11 AM 01/21/2022    9:36 AM  Advanced Directives  Does Patient Have a Medical Advance Directive? Yes Yes Yes Yes Yes Yes Yes  Type of Estate agent of [place];Living will      Healthcare Power of [place];Living will  Does patient want to make changes to medical advance directive?  No - Patient declined  No - Patient declined No - Patient declined No - Patient declined   Copy of Healthcare Power of Attorney in Chart? No - copy requested        Would patient like information on creating a medical advance directive?   No - Patient declined No - Patient declined   No - Patient declined    Current Medications (verified) Outpatient Encounter Medications as of 02/15/2023  Medication Sig   acyclovir (ZOVIRAX) 400 MG tablet Take 1 tablet (400 mg total) by mouth 2 (two) times daily.   ALPHAGAN P 0.1 % SOLN Place 1 drop into both eyes in the morning, at noon, and at bedtime.    amLODipine (NORVASC) 10 MG tablet Take 1 tablet (10 mg total) by mouth daily.   aspirin EC 81 MG tablet Take 81 mg by mouth daily.   Cholecalciferol (VITAMIN D3) 125 MCG (5000  UT) TABS Take 5,000 Units by mouth daily.   Co-Enzyme Q-10 100 MG CAPS Take 100 mg by mouth daily.   dexamethasone (DECADRON) 4 MG tablet Take 4 tablets (16 mg total) by mouth as directed. Take one hour prior to monthly Darzalex infusion.   dorzolamide-timolol (COSOPT) 22.3-6.8 MG/ML ophthalmic solution Place 1 drop into both eyes 2 (two) times daily.    fentaNYL (DURAGESIC) 12 MCG/HR Place 1 patch onto the skin every 3 (three) days.   levothyroxine (SYNTHROID) 88 MCG tablet Take 1 tablet (88 mcg total) by mouth daily before breakfast.   lidocaine-prilocaine (EMLA) cream Apply to affected area once   magnesium chloride (SLOW-MAG) 64 MG TBEC SR tablet Take by mouth.   Multiple Vitamins-Minerals (MULTIVITAMIN WITH MINERALS) tablet Take 1 tablet by mouth daily. Centrum Silver 50 +   Netarsudil-Latanoprost (ROCKLATAN) 0.02-0.005 % SOLN Place 1 drop into both eyes daily.   Omega-3 1000 MG CAPS Take 1,000 mg by mouth daily.    oxyCODONE-acetaminophen (PERCOCET) 5-325 MG tablet Take 1-2 tablets by mouth every 4 (four) hours as needed for moderate pain or severe pain.   polyethylene glycol (MIRALAX) packet Take 17 g by mouth daily.   pomalidomide (POMALYST) 2 MG capsule Take 1 capsule (2 mg total) by mouth daily. Take for 21 days on, 7 days off,  repeat every 28 days   prochlorperazine (COMPAZINE) 10 MG tablet Take 1 tablet (10 mg total) by mouth every 6 (six) hours as needed for nausea or vomiting.   senna-docusate (SENOKOT-S) 8.6-50 MG tablet Take 2 tablets by mouth at bedtime.   Simethicone (GAS-X PO) Take 1 tablet by mouth as needed.   vitamin C (ASCORBIC ACID) 500 MG tablet Take 500 mg by mouth 2 (two) times a week.    ondansetron (ZOFRAN) 8 MG tablet Take 1 tablet (8 mg total) by mouth every 8 (eight) hours as needed for nausea or vomiting. (Patient not taking: Reported on 02/15/2023)   [DISCONTINUED] LUMIGAN 0.01 % SOLN 1 drop at bedtime.   No facility-administered encounter medications on file as  of 02/15/2023.    Allergies (verified) Ace inhibitors, Benadryl [diphenhydramine], Diamox [acetazolamide], Sulfamethoxazole, Lenalidomide, and Penicillins   History: Past Medical History:  Diagnosis Date   Anemia    Glaucoma    HYPERTENSION 03/11/2007   Multiple myeloma (HCC)    OSTEOPENIA 03/11/2007   Pre-diabetes    Thyroid cancer (HCC)    Thyroid disease    Tubular adenoma of colon 07/2015   Past Surgical History:  Procedure Laterality Date   BONE MARROW BIOPSY     multiple   BREAST EXCISIONAL BIOPSY Right 2000   BREAST LUMPECTOMY  1990   benign   CATARACT EXTRACTION Bilateral 2018   DILATION AND CURETTAGE OF UTERUS     bleeding at menopause. No uterine cancer   IR IMAGING GUIDED PORT INSERTION  05/16/2021   IR RADIOLOGIST EVAL & MGMT  12/13/2018   THYROIDECTOMY N/A 08/02/2020   Procedure: TOTAL THYROIDECTOMY;  Surgeon: Darnell Level, MD;  Location: [facility];  Service: General;  Laterality: N/A;   TONSILLECTOMY     age 79   Family History  Problem Relation Age of Onset   Heart disease Mother        CHF mother died 53   Arthritis Mother    Glaucoma Mother        sister as well   Alcohol abuse Father    Suicidality Father    Heart disease Sister        aortic valve replacement   Lung cancer Sister        smoker   Hyperlipidemia Brother    Hypertension Brother    COPD Brother    Colon polyps Brother    Arthritis Sister    Hypertension Sister    Glaucoma Sister    Hashimoto's thyroiditis Sister    Colon polyps Sister    Hypertension Son    Stroke Maternal Grandmother    Cystic fibrosis Niece    Colon cancer Neg Hx    Social History   Socioeconomic History   Marital status: Married    Spouse name: Not on file   Number of children: 1   Years of education: Not on file   Highest education level: Not on file  Occupational History   Occupation: Retired  Tobacco Use   Smoking status: Former    Packs/day: 0.50    Years: 7.00    Additional pack years: 0.00     Total pack years: 3.50    Types: Cigarettes    Quit date: 01/04/1961    Years since quitting: 62.1   Smokeless tobacco: Never  Vaping Use   Vaping Use: Never used  Substance and Sexual Activity   Alcohol use: Not Currently    Alcohol/week: 1.0 standard drink of alcohol  Types: 1 Standard drinks or equivalent per week    Comment: occas    Drug use: No   Sexual activity: Not Currently  Other Topics Concern   Not on file  Social History Narrative   Married. Lives with husband (patient of Dr. Durene Cal). 1 son. No grandkids. 1 granddog.       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, swing dancing, dinner, read      Social Determinants of Health   Financial Resource Strain: Low Risk  (02/15/2023)   Overall Financial Resource Strain (CARDIA)    Difficulty of Paying Living Expenses: Not hard at all  Food Insecurity: No Food Insecurity (02/15/2023)   Hunger Vital Sign    Worried About Running Out of Food in the Last Year: Never true    Ran Out of Food in the Last Year: Never true  Transportation Needs: No Transportation Needs (02/15/2023)   PRAPARE - Administrator, Civil Service (Medical): No    Lack of Transportation (Non-Medical): No  Physical Activity: Inactive (02/15/2023)   Exercise Vital Sign    Days of Exercise per Week: 0 days    Minutes of Exercise per Session: 0 min  Stress: No Stress Concern Present (02/15/2023)   [employer] of Occupational Health - Occupational Stress Questionnaire    Feeling of Stress : Not at all  Social Connections: Moderately Integrated (02/15/2023)   Social Connection and Isolation Panel [NHANES]    Frequency of Communication with Friends and Family: More than three times a week    Frequency of Social Gatherings with Friends and Family: More than three times a week    Attends Religious Services: More than 4 times per year    Active Member of [employer] or  Organizations: No    Attends Engineer, structural: Never    Marital Status: Married    Tobacco Counseling Counseling given: Not Answered   Clinical Intake:  Pre-visit preparation completed: Yes  Pain : No/denies pain     BMI - recorded: 20.7 Nutritional Status: BMI of 19-24  Normal Nutritional Risks: None Diabetes: No  How often do you need to have someone help you when you read instructions, pamphlets, or other written materials from your doctor or pharmacy?: 1 - Never  Diabetic?no  Interpreter Needed?: No  Information entered by :: Lanier Ensign, LPN   Activities of Daily Living    02/15/2023    8:45 AM  In your present state of health, do you have any difficulty performing the following activities:  Hearing? 0  Vision? 0  Difficulty concentrating or making decisions? 0  Walking or climbing stairs? 0  Dressing or bathing? 0  Doing errands, shopping? 0  Preparing Food and eating ? N  Using the Toilet? N  In the past six months, have you accidently leaked urine? N  Do you have problems with loss of bowel control? N  Comment wears a pad for in case  Managing your Medications? N  Managing your Finances? N  Housekeeping or managing your Housekeeping? N    Patient Care Team: Shelva Majestic, MD as PCP - General (Family Medicine) Johney Maine, MD as Consulting Physician (Hematology) Bond, Doran Stabler, MD as Referring Physician (Ophthalmology) Zigmund Daniel, Maureen Ralphs, MD as Consulting Physician (Hematology and Oncology) Carlus Pavlov, MD as Consulting Physician (Internal Medicine) Darnell Level, MD as Consulting Physician (General Surgery) Erroll Luna,  RPH as Pharmacist (Pharmacist)  Indicate any recent Medical Services you may have received from other than [facility] providers in the past year (date may be approximate).     Assessment:   This is a routine wellness examination for Kamani.  Hearing/Vision screen Hearing Screening -  Comments:: Pt denies any hearing issues  Vision Screening - Comments:: Dr Diego Cory For eye exams and dr Osborne Casco [place]il   Dietary issues and exercise activities discussed: Current Exercise Habits: The patient does not participate in regular exercise at present   Goals Addressed             This Visit's Progress    Patient Stated       Stay healthy as I can        Depression Screen    02/15/2023    8:43 AM 02/08/2023    8:53 AM 10/28/2021    8:55 AM 01/27/2021    2:34 PM 09/18/2019   11:29 AM 09/06/2018    9:18 AM 04/04/2018    8:06 AM  PHQ 2/9 Scores  PHQ - 2 Score 0 0 0 0 0 0 0  PHQ- 9 Score 0 2    0     Fall Risk    02/15/2023    8:45 AM 02/08/2023    8:53 AM 10/28/2021    8:59 AM 08/04/2021    8:20 AM 01/27/2021    2:34 PM  Fall Risk   Falls in the past year? 0 0 0 0 0  Number falls in past yr: 0 0 0 0 0  Injury with Fall? 0 0 0 0 0  Risk for fall due to : Impaired vision No Fall Risks Impaired vision    Follow up Falls prevention discussed Falls prevention discussed Falls prevention discussed Falls evaluation completed     FALL RISK PREVENTION PERTAINING TO THE HOME:  Any stairs in or around the home? Yes  If so, are there any without handrails? No  Home free of loose throw rugs in walkways, pet beds, electrical cords, etc? Yes  Adequate lighting in your home to reduce risk of falls? Yes   ASSISTIVE DEVICES UTILIZED TO PREVENT FALLS:  Life alert? No  Use of a cane, walker or w/c? No  Grab bars in the bathroom? Yes  Shower chair or bench in shower? No  Elevated toilet seat or a handicapped toilet? No   TIMED UP AND GO:  Was the test performed? No .   Cognitive Function:    09/06/2018    9:41 AM  MMSE - Mini Mental State Exam  Orientation to time 5  Orientation to Place 5  Registration 3  Attention/ Calculation 5  Recall 3  Language- name 2 objects 2  Language- repeat 1  Language- follow 3 step command 3  Language- read & follow direction 1   Write a sentence 1  Copy design 1  Total score 30        02/15/2023    8:46 AM 10/28/2021    9:01 AM  6CIT Screen  What Year? 0 points 0 points  What month? 0 points 0 points  What time? 0 points 0 points  Count back from 20 0 points 0 points  Months in reverse 0 points 0 points  Repeat phrase 0 points 0 points  Total Score 0 points 0 points    Immunizations Immunization History  Administered Date(s) Administered   Fluad Quad(high Dose 65+) 06/13/2019, 07/03/2020, 06/17/2021, 06/25/2022   Influenza Split  07/19/2012   Influenza Whole 07/17/2008, 06/28/2009, 06/25/2010   Influenza, High Dose Seasonal PF 07/22/2016, 06/24/2017, 06/23/2018, 08/10/2022   Influenza,inj,Quad PF,6+ Mos 06/27/2013, 06/18/2014   Influenza-Unspecified 07/03/2015   PFIZER(Purple Top)SARS-COV-2 Vaccination 11/09/2019, 12/04/2019, 06/03/2020, 01/09/2021, 07/04/2021, 07/03/2022   PNEUMOCOCCAL CONJUGATE-20 05/21/2021   Pneumococcal Conjugate-13 03/29/2015   Pneumococcal Polysaccharide-23 04/04/2006   Respiratory Syncytial Virus Vaccine,Recomb Aduvanted(Arexvy) 08/10/2022   Td 02/10/2010   Zoster Recombinat (Shingrix) 03/27/2020, 09/06/2020   Zoster, Live 09/26/2008    TDAP status: Due, Education has been provided regarding the importance of this vaccine. Advised may receive this vaccine at local pharmacy or Health Dept. Aware to provide a copy of the vaccination record if obtained from local pharmacy or Health Dept. Verbalized acceptance and understanding.  Flu Vaccine status: Up to date  Pneumococcal vaccine status: Up to date  Covid-19 vaccine status: Completed vaccines  Qualifies for Shingles Vaccine? Yes   Zostavax completed Yes   Shingrix Completed?: Yes  Screening Tests Health Maintenance  Topic Date Due   DTaP/Tdap/Td (2 - Tdap) 02/11/2020   COVID-19 Vaccine (7 - 2023-24 season) 02/24/2023 (Originally 08/28/2022)   INFLUENZA VACCINE  05/06/2023   Medicare Annual Wellness (AWV)   02/15/2024   Pneumonia Vaccine 57+ Years old  Completed   DEXA SCAN  Completed   Zoster Vaccines- Shingrix  Completed   HPV VACCINES  Aged Out   COLONOSCOPY (Pts 45-50yrs Insurance coverage will need to be confirmed)  Discontinued    Health Maintenance  Health Maintenance Due  Topic Date Due   DTaP/Tdap/Td (2 - Tdap) 02/11/2020    Colorectal cancer screening: No longer required.   Mammogram status: Completed 02/08/18. Repeat every year  Bone Density status: Completed 04/13/13. Results reflect: Bone density results: OSTEOPOROSIS. Repeat every 2 years. Additional Screening:   Vision Screening: Recommended annual ophthalmology exams for early detection of glaucoma and other disorders of the eye. Is the patient up to date with their annual eye exam?  Yes  Who is the provider or what is the name of the office in which the patient attends annual eye exams? Dr Diego Cory For eye exams and dr Osborne Casco [place] If pt is not established with a provider, would they like to be referred to a provider to establish care? No .   Dental Screening: Recommended annual dental exams for proper oral hygiene  Community Resource Referral / Chronic Care Management: CRR required this visit?  No   CCM required this visit?  No      Plan:     I have personally reviewed and noted the following in the patient's chart:   Medical and social history Use of alcohol, tobacco or illicit drugs  Current medications and supplements including opioid prescriptions. Patient is currently taking opioid prescriptions. Information provided to patient regarding non-opioid alternatives. Patient advised to discuss non-opioid treatment plan with their provider. Functional ability and status Nutritional status Physical activity Advanced directives List of other physicians Hospitalizations, surgeries, and ER visits in previous 12 months Vitals Screenings to include cognitive, depression, and falls Referrals and  appointments  In addition, I have reviewed and discussed with patient certain preventive protocols, quality metrics, and best practice recommendations. A written personalized care plan for preventive services as well as general preventive health recommendations were provided to patient.     Marzella Schlein, LPN   [id number]/61/0960   Nurse Notes: none

## 2023-02-15 NOTE — Telephone Encounter (Signed)
Patient called - requested refill of Fentanyl patches Refill request routed to MD

## 2023-02-15 NOTE — Patient Instructions (Signed)
Megan Olson , Thank you for taking time to come for your Medicare Wellness Visit. I appreciate your ongoing commitment to your health goals. Please review the following plan we discussed and let me know if I can assist you in the future.   These are the goals we discussed:  Goals      Exercise 150 min/wk Moderate Activity     Increase your exercise from 2 days a week to 3 days a week. Looking at joining [employer] [facility] [employer] [facility]     Timeframe:  Long-Range Goal Priority:  High Start Date:  12/01/21                           Expected End Date:  05/31/22                     Follow Up Date 02/28/22    - call for prescription refill 3 days before needed - develop a personal pain management plan - plan exercise or activity when pain is best controlled - prioritize tasks for the day - use medicine only as doctor says    Why is this important?   Day-to-day life can be hard when you have pain.  Even a small change in emotion or a physical problem can make pain better or worse.  Coping with pain depends on how the mind and body reacts to pain.  Pain medicine is just one piece of the treatment puzzle.  There are many tools to help manage pain. A combination of them can be used to best meet your needs.     Notes:      Patient Stated     Stay as healthy as can be     Patient Stated     Stay healthy as I can         This is a list of the screening recommended for you and due dates:  Health Maintenance  Topic Date Due   DTaP/Tdap/Td vaccine (2 - Tdap) 02/11/2020   COVID-19 Vaccine (7 - 2023-24 season) 02/24/2023*   Flu Shot  05/06/2023   Medicare Annual Wellness Visit  02/15/2024   Pneumonia Vaccine  Completed   DEXA scan (bone density measurement)  Completed   Zoster (Shingles) Vaccine  Completed   HPV Vaccine  Aged Out   Colon Cancer Screening  Discontinued  *Topic was postponed. The date shown is not the original due date.    Advanced  directives: Please bring a copy of your health care power of attorney and living will to the office at your convenience.  Conditions/risks identified: stay healthy as I can   Next appointment: Follow up in one year for your annual wellness visit    Preventive Care 65 Years and Older, Female Preventive care refers to lifestyle choices and visits with your health care provider that can promote health and wellness. What does preventive care include? A yearly physical exam. This is also called an annual well check. Dental exams once or twice a year. Routine eye exams. Ask your health care provider how often you should have your eyes checked. Personal lifestyle choices, including: Daily care of your teeth and gums. Regular physical activity. Eating a healthy diet. Avoiding tobacco and drug use. Limiting alcohol use. Practicing safe sex. Taking low-dose aspirin every day. Taking vitamin and mineral supplements as recommended by your health care provider. What happens during an annual  well check? The services and screenings done by your health care provider during your annual well check will depend on your age, overall health, lifestyle risk factors, and family history of disease. Counseling  Your health care provider may ask you questions about your: Alcohol use. Tobacco use. Drug use. Emotional well-being. Home and relationship well-being. Sexual activity. Eating habits. History of falls. Memory and ability to understand (cognition). Work and work Astronomer. Reproductive health. Screening  You may have the following tests or measurements: Height, weight, and BMI. Blood pressure. Lipid and cholesterol levels. These may be checked every 5 years, or more frequently if you are over 56 years old. Skin check. Lung cancer screening. You may have this screening every year starting at age 33 if you have a 30-pack-year history of smoking and currently smoke or have quit within the past  15 years. Fecal occult blood test (FOBT) of the stool. You may have this test every year starting at age 22. Flexible sigmoidoscopy or colonoscopy. You may have a sigmoidoscopy every 5 years or a colonoscopy every 10 years starting at age 33. Hepatitis C blood test. Hepatitis B blood test. Sexually transmitted disease (STD) testing. Diabetes screening. This is done by checking your blood sugar (glucose) after you have not eaten for a while (fasting). You may have this done every 1-3 years. Bone density scan. This is done to screen for osteoporosis. You may have this done starting at age 71. Mammogram. This may be done every 1-2 years. Talk to your health care provider about how often you should have regular mammograms. Talk with your health care provider about your test results, treatment options, and if necessary, the need for more tests. Vaccines  Your health care provider may recommend certain vaccines, such as: Influenza vaccine. This is recommended every year. Tetanus, diphtheria, and acellular pertussis (Tdap, Td) vaccine. You may need a Td booster every 10 years. Zoster vaccine. You may need this after age 3. Pneumococcal 13-valent conjugate (PCV13) vaccine. One dose is recommended after age 49. Pneumococcal polysaccharide (PPSV23) vaccine. One dose is recommended after age 66. Talk to your health care provider about which screenings and vaccines you need and how often you need them. This information is not intended to replace advice given to you by your health care provider. Make sure you discuss any questions you have with your health care provider. Document Released: 10/18/2015 Document Revised: 06/10/2016 Document Reviewed: 07/23/2015 Elsevier Interactive Patient Education  2017 [employer].  Fall Prevention in the Home Falls can cause injuries. They can happen to people of all ages. There are many things you can do to make your home safe and to help prevent falls. What can I do  on the outside of my home? Regularly fix the edges of walkways and driveways and fix any cracks. Remove anything that might make you trip as you walk through a door, such as a raised step or threshold. Trim any bushes or trees on the path to your home. Use bright outdoor lighting. Clear any walking paths of anything that might make someone trip, such as rocks or tools. Regularly check to see if handrails are loose or broken. Make sure that both sides of any steps have handrails. Any raised decks and porches should have guardrails on the edges. Have any leaves, snow, or ice cleared regularly. Use sand or salt on walking paths during winter. Clean up any spills in your garage right away. This includes oil or grease spills. What can I do  in the bathroom? Use night lights. Install grab bars by the toilet and in the tub and shower. Do not use towel bars as grab bars. Use non-skid mats or decals in the tub or shower. If you need to sit down in the shower, use a plastic, non-slip stool. Keep the floor dry. Clean up any water that spills on the floor as soon as it happens. Remove soap buildup in the tub or shower regularly. Attach bath mats securely with double-sided non-slip rug tape. Do not have throw rugs and other things on the floor that can make you trip. What can I do in the bedroom? Use night lights. Make sure that you have a light by your bed that is easy to reach. Do not use any sheets or blankets that are too big for your bed. They should not hang down onto the floor. Have a firm chair that has side arms. You can use this for support while you get dressed. Do not have throw rugs and other things on the floor that can make you trip. What can I do in the kitchen? Clean up any spills right away. Avoid walking on wet floors. Keep items that you use a lot in easy-to-reach places. If you need to reach something above you, use a strong step stool that has a grab bar. Keep electrical cords  out of the way. Do not use floor polish or wax that makes floors slippery. If you must use wax, use non-skid floor wax. Do not have throw rugs and other things on the floor that can make you trip. What can I do with my stairs? Do not leave any items on the stairs. Make sure that there are handrails on both sides of the stairs and use them. Fix handrails that are broken or loose. Make sure that handrails are as long as the stairways. Check any carpeting to make sure that it is firmly attached to the stairs. Fix any carpet that is loose or worn. Avoid having throw rugs at the top or bottom of the stairs. If you do have throw rugs, attach them to the floor with carpet tape. Make sure that you have a light switch at the top of the stairs and the bottom of the stairs. If you do not have them, ask someone to add them for you. What else can I do to help prevent falls? Wear shoes that: Do not have high heels. Have rubber bottoms. Are comfortable and fit you well. Are closed at the toe. Do not wear sandals. If you use a stepladder: Make sure that it is fully opened. Do not climb a closed stepladder. Make sure that both sides of the stepladder are locked into place. Ask someone to hold it for you, if possible. Clearly mark and make sure that you can see: Any grab bars or handrails. First and last steps. Where the edge of each step is. Use tools that help you move around (mobility aids) if they are needed. These include: Canes. Walkers. Scooters. Crutches. Turn on the lights when you go into a dark area. Replace any light bulbs as soon as they burn out. Set up your furniture so you have a clear path. Avoid moving your furniture around. If any of your floors are uneven, fix them. If there are any pets around you, be aware of where they are. Review your medicines with your doctor. Some medicines can make you feel dizzy. This can increase your chance of falling. Ask your doctor  what other things  that you can do to help prevent falls. This information is not intended to replace advice given to you by your health care provider. Make sure you discuss any questions you have with your health care provider. Document Released: 07/18/2009 Document Revised: 02/27/2016 Document Reviewed: 10/26/2014 Elsevier Interactive Patient Education  2017 [employer].

## 2023-02-16 ENCOUNTER — Other Ambulatory Visit: Payer: Self-pay

## 2023-02-16 ENCOUNTER — Inpatient Hospital Stay: Payer: Medicare HMO | Attending: Hematology | Admitting: Hematology

## 2023-02-16 ENCOUNTER — Inpatient Hospital Stay: Payer: Medicare HMO

## 2023-02-16 ENCOUNTER — Encounter: Payer: Self-pay | Admitting: Hematology

## 2023-02-16 VITALS — BP 173/76 | HR 65 | Temp 98.6°F | Resp 17 | Wt 108.4 lb

## 2023-02-16 VITALS — BP 174/79 | HR 59 | Resp 19

## 2023-02-16 DIAGNOSIS — Z5111 Encounter for antineoplastic chemotherapy: Secondary | ICD-10-CM

## 2023-02-16 DIAGNOSIS — C9000 Multiple myeloma not having achieved remission: Secondary | ICD-10-CM

## 2023-02-16 DIAGNOSIS — Z7189 Other specified counseling: Secondary | ICD-10-CM

## 2023-02-16 DIAGNOSIS — C9002 Multiple myeloma in relapse: Secondary | ICD-10-CM

## 2023-02-16 DIAGNOSIS — Z95828 Presence of other vascular implants and grafts: Secondary | ICD-10-CM

## 2023-02-16 DIAGNOSIS — Z5112 Encounter for antineoplastic immunotherapy: Secondary | ICD-10-CM | POA: Diagnosis present

## 2023-02-16 LAB — CBC WITH DIFFERENTIAL (CANCER CENTER ONLY)
Abs Immature Granulocytes: 0.01 10*3/uL (ref 0.00–0.07)
Basophils Absolute: 0.1 10*3/uL (ref 0.0–0.1)
Basophils Relative: 4 %
Eosinophils Absolute: 0.1 10*3/uL (ref 0.0–0.5)
Eosinophils Relative: 2 %
HCT: 34.7 % — ABNORMAL LOW (ref 36.0–46.0)
Hemoglobin: 11.4 g/dL — ABNORMAL LOW (ref 12.0–15.0)
Immature Granulocytes: 0 %
Lymphocytes Relative: 15 %
Lymphs Abs: 0.5 10*3/uL — ABNORMAL LOW (ref 0.7–4.0)
MCH: 32.4 pg (ref 26.0–34.0)
MCHC: 32.9 g/dL (ref 30.0–36.0)
MCV: 98.6 fL (ref 80.0–100.0)
Monocytes Absolute: 0.6 10*3/uL (ref 0.1–1.0)
Monocytes Relative: 18 %
Neutro Abs: 2 10*3/uL (ref 1.7–7.7)
Neutrophils Relative %: 61 %
Platelet Count: 288 10*3/uL (ref 150–400)
RBC: 3.52 MIL/uL — ABNORMAL LOW (ref 3.87–5.11)
RDW: 15 % (ref 11.5–15.5)
WBC Count: 3.3 10*3/uL — ABNORMAL LOW (ref 4.0–10.5)
nRBC: 0 % (ref 0.0–0.2)

## 2023-02-16 LAB — CMP (CANCER CENTER ONLY)
ALT: 11 U/L (ref 0–44)
AST: 12 U/L — ABNORMAL LOW (ref 15–41)
Albumin: 3.9 g/dL (ref 3.5–5.0)
Alkaline Phosphatase: 29 U/L — ABNORMAL LOW (ref 38–126)
Anion gap: 5 (ref 5–15)
BUN: 15 mg/dL (ref 8–23)
CO2: 27 mmol/L (ref 22–32)
Calcium: 8.7 mg/dL — ABNORMAL LOW (ref 8.9–10.3)
Chloride: 107 mmol/L (ref 98–111)
Creatinine: 0.82 mg/dL (ref 0.44–1.00)
GFR, Estimated: >60 mL/min (ref >60)
Glucose, Bld: 115 mg/dL — ABNORMAL HIGH (ref 70–99)
Potassium: 4 mmol/L (ref 3.5–5.1)
Sodium: 139 mmol/L (ref 135–145)
Total Bilirubin: 0.3 mg/dL (ref 0.3–1.2)
Total Protein: 6.6 g/dL (ref 6.5–8.1)

## 2023-02-16 MED ORDER — ACETAMINOPHEN 325 MG PO TABS
650.0000 mg | ORAL_TABLET | Freq: Once | ORAL | Status: AC
  Administered 2023-02-16: 650 mg via ORAL
  Filled 2023-02-16: qty 2

## 2023-02-16 MED ORDER — FAMOTIDINE 20 MG IN NS 100 ML IVPB
20.0000 mg | Freq: Once | INTRAVENOUS | Status: AC
  Administered 2023-02-16: 20 mg via INTRAVENOUS
  Filled 2023-02-16: qty 100

## 2023-02-16 MED ORDER — SODIUM CHLORIDE 0.9% FLUSH
10.0000 mL | Freq: Once | INTRAVENOUS | Status: AC
  Administered 2023-02-16: 10 mL

## 2023-02-16 MED ORDER — HYDROXYZINE HCL 25 MG PO TABS
25.0000 mg | ORAL_TABLET | Freq: Once | ORAL | Status: AC
  Administered 2023-02-16: 25 mg via ORAL
  Filled 2023-02-16: qty 1

## 2023-02-16 MED ORDER — HEPARIN SOD (PORK) LOCK FLUSH 100 UNIT/ML IV SOLN
500.0000 [IU] | Freq: Once | INTRAVENOUS | Status: AC | PRN
  Administered 2023-02-16: 500 [IU]

## 2023-02-16 MED ORDER — SODIUM CHLORIDE 0.9% FLUSH
10.0000 mL | INTRAVENOUS | Status: DC | PRN
  Administered 2023-02-16: 10 mL

## 2023-02-16 MED ORDER — FENTANYL 12 MCG/HR TD PT72
1.0000 | MEDICATED_PATCH | TRANSDERMAL | 0 refills | Status: DC
Start: 2023-02-16 — End: 2023-03-15

## 2023-02-16 MED ORDER — SODIUM CHLORIDE 0.9 % IV SOLN
16.0000 mg/kg | Freq: Once | INTRAVENOUS | Status: AC
  Administered 2023-02-16: 800 mg via INTRAVENOUS
  Filled 2023-02-16: qty 40

## 2023-02-16 MED ORDER — SODIUM CHLORIDE 0.9 % IV SOLN: Freq: Once | INTRAVENOUS | Status: AC

## 2023-02-16 MED ORDER — ZOLEDRONIC ACID 4 MG/100ML IV SOLN
4.0000 mg | Freq: Once | INTRAVENOUS | Status: AC
  Administered 2023-02-16: 4 mg via INTRAVENOUS
  Filled 2023-02-16: qty 100

## 2023-02-16 NOTE — Progress Notes (Signed)
[facility]   [phone] [phone]   Clinic Follow up Note   Patient Care Team: Shelva Majestic, MD as PCP - General (Family Medicine) Johney Maine, MD as Consulting Physician (Hematology) Bond, Doran Stabler, MD as Referring Physician (Ophthalmology) Zigmund Daniel, Maureen Ralphs, MD as Consulting Physician (Hematology and Oncology) Carlus Pavlov, MD as Consulting Physician (Internal Medicine) Darnell Level, MD as Consulting Physician (General Surgery) Erroll Luna, [facility] as Pharmacist (Pharmacist)  Date of Service: 02/16/2023  CHIEF COMPLAINT:  Follow-up for continuation and management of multiple myeloma   SUMMARY OF ONCOLOGIC HISTORY: Oncology History  Multiple myeloma not having achieved remission (HCC)  12/12/2018 Initial Diagnosis   Multiple myeloma not having achieved remission (HCC)   12/20/2018 - 10/04/2019 Chemotherapy   The patient had dexamethasone (DECADRON) tablet 20 mg, 20 mg (100 % of original dose 20 mg), Oral, Once, 14 of 14 cycles Dose modification: 20 mg (original dose 20 mg, Cycle 1), 10 mg (original dose 20 mg, Cycle 14) Administration: 20 mg (12/20/2018), 20 mg (12/27/2018), 20 mg (01/10/2019), 20 mg (01/17/2019), 20 mg (01/31/2019), 20 mg (02/07/2019), 20 mg (03/14/2019), 20 mg (03/21/2019), 20 mg (02/21/2019), 20 mg (02/28/2019), 20 mg (04/04/2019), 20 mg (04/11/2019), 20 mg (04/25/2019), 20 mg (05/02/2019), 20 mg (05/16/2019), 20 mg (05/23/2019), 20 mg (06/06/2019), 20 mg (06/13/2019), 20 mg (06/27/2019), 20 mg (07/04/2019), 20 mg (07/18/2019), 20 mg (07/25/2019), 20 mg (08/08/2019), 20 mg (08/15/2019), 20 mg (08/29/2019), 20 mg (09/05/2019), 10 mg (09/19/2019) lenalidomide (REVLIMID) 15 MG capsule, 1 of 1 cycle, Start date: 01/18/2019, End date: 02/21/2019 bortezomib SQ (VELCADE) chemo injection 2 mg, 1.3 mg/m2 = 2 mg, Subcutaneous,  Once, 14 of 14 cycles Administration: 2 mg (12/20/2018), 2 mg (12/27/2018), 2 mg (12/23/2018), 2 mg (12/30/2018),  2 mg (01/10/2019), 2 mg (01/17/2019), 2 mg (01/13/2019), 2 mg (01/20/2019), 2 mg (01/31/2019), 2 mg (02/07/2019), 2 mg (02/03/2019), 2 mg (02/10/2019), 2 mg (03/14/2019), 2 mg (03/17/2019), 2 mg (03/21/2019), 2 mg (03/24/2019), 2 mg (02/21/2019), 2 mg (02/24/2019), 2 mg (02/28/2019), 2 mg (03/03/2019), 2 mg (04/04/2019), 2 mg (04/06/2019), 2 mg (04/11/2019), 2 mg (04/14/2019), 2 mg (04/25/2019), 2 mg (04/28/2019), 2 mg (05/02/2019), 2 mg (05/05/2019), 2 mg (05/16/2019), 2 mg (05/19/2019), 2 mg (05/23/2019), 2 mg (05/26/2019), 2 mg (06/06/2019), 2 mg (06/09/2019), 2 mg (06/13/2019), 2 mg (06/16/2019), 2 mg (06/27/2019), 2 mg (06/30/2019), 2 mg (07/04/2019), 2 mg (07/07/2019), 2 mg (07/18/2019), 2 mg (07/21/2019), 2 mg (07/25/2019), 2 mg (07/28/2019), 2 mg (08/08/2019), 2 mg (08/11/2019), 2 mg (08/15/2019), 2 mg (08/18/2019), 2 mg (08/29/2019), 2 mg (09/01/2019), 2 mg (09/05/2019), 2 mg (09/08/2019), 2 mg (09/19/2019), 2 mg (10/04/2019)  for chemotherapy treatment.    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 female returns for continued evaluation and management of multiple myeloma. 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   Patient was last seen by me on 01/19/2023  and she complained of small nodule in an area on her mid back which remained unchanged and mild fatigue.   Patient reports she has been doing well overall without any new or severe medical concerns since our last visit. She does complain of occasional right  shoulder pain which occasionally radiates to her hand. She denies tingling sensation in her hands.   She denies fever, chills, night sweats, unexpected weight loss, infection issues, neck pain, abdominal pain, chest pain, back pain, neuropathy, or leg swelling. She notes she was constipated this past weekend, which got better after she had magnesium citrate.  Patient has been tolerating her treatment well without any new or severe toxicities.   MEDICAL HISTORY:  Past Medical History:  Diagnosis Date   Anemia    Glaucoma    HYPERTENSION 03/11/2007   Multiple myeloma (HCC)    OSTEOPENIA 03/11/2007   Pre-diabetes    Thyroid cancer (HCC)    Thyroid disease    Tubular adenoma of colon 07/2015    SURGICAL HISTORY: Past Surgical History:  Procedure Laterality Date   BONE MARROW BIOPSY     multiple   BREAST EXCISIONAL BIOPSY Right 2000   BREAST LUMPECTOMY  1990   benign   CATARACT EXTRACTION Bilateral 2018   DILATION AND CURETTAGE OF UTERUS     bleeding at menopause. No uterine cancer   IR IMAGING GUIDED PORT INSERTION  05/16/2021   IR RADIOLOGIST EVAL & MGMT  12/13/2018   THYROIDECTOMY N/A 08/02/2020   Procedure: TOTAL THYROIDECTOMY;  Surgeon: Darnell Level, MD;  Location: [facility];  Service: General;  Laterality: N/A;   TONSILLECTOMY     age 19   I have reviewed the social history and family history with the patient and they are unchanged from previous note.  ALLERGIES:  is allergic to ace inhibitors, benadryl [diphenhydramine], diamox [acetazolamide], sulfamethoxazole, lenalidomide, and penicillins.  MEDICATIONS:  Current Outpatient Medications  Medication Sig Dispense Refill   acyclovir (ZOVIRAX) 400 MG tablet Take 1 tablet (400 mg total) by mouth 2 (two) times daily. 60 tablet 3   ALPHAGAN P 0.1 % SOLN Place 1 drop into both eyes in the morning, at noon, and at bedtime.      amLODipine (NORVASC) 10 MG tablet Take 1 tablet (10 mg total) by mouth daily. 90 tablet 3   aspirin EC 81  MG tablet Take 81 mg by mouth daily.     Cholecalciferol (VITAMIN D3) 125 MCG (5000 UT) TABS Take 5,000 Units by mouth daily.     Co-Enzyme Q-10 100 MG CAPS Take 100 mg by mouth daily.     dexamethasone (DECADRON) 4 MG tablet Take 4 tablets (16 mg total) by mouth as directed. Take one hour prior to monthly Darzalex infusion. 12 tablet 3   dorzolamide-timolol (COSOPT) 22.3-6.8 MG/ML ophthalmic solution Place 1 drop into both eyes 2 (two) times daily.   11   fentaNYL (DURAGESIC) 12 MCG/HR Place 1 patch onto the skin every 3 (three) days. 10 patch 0   levothyroxine (SYNTHROID) 88 MCG tablet Take 1 tablet (88 mcg total) by mouth daily before breakfast. 90 tablet 3   lidocaine-prilocaine (EMLA) cream Apply to affected area once 30 g 3   magnesium chloride (SLOW-MAG) 64 MG TBEC SR tablet Take by mouth.     Multiple Vitamins-Minerals (MULTIVITAMIN WITH MINERALS) tablet Take 1 tablet by mouth daily. Centrum Silver 50 +     Netarsudil-Latanoprost (ROCKLATAN) 0.02-0.005 % SOLN Place 1 drop into both eyes daily.  Omega-3 1000 MG CAPS Take 1,000 mg by mouth daily.      ondansetron (ZOFRAN) 8 MG tablet Take 1 tablet (8 mg total) by mouth every 8 (eight) hours as needed for nausea or vomiting. (Patient not taking: Reported on 02/15/2023) 30 tablet 1   oxyCODONE-acetaminophen (PERCOCET) 5-325 MG tablet Take 1-2 tablets by mouth every 4 (four) hours as needed for moderate pain or severe pain. 90 tablet 0   polyethylene glycol (MIRALAX) packet Take 17 g by mouth daily. 30 each 1   pomalidomide (POMALYST) 2 MG capsule Take 1 capsule (2 mg total) by mouth daily. Take for 21 days on, 7 days off, repeat every 28 days 21 capsule 0   prochlorperazine (COMPAZINE) 10 MG tablet Take 1 tablet (10 mg total) by mouth every 6 (six) hours as needed for nausea or vomiting. 30 tablet 1   senna-docusate (SENOKOT-S) 8.6-50 MG tablet Take 2 tablets by mouth at bedtime. 60 tablet 0   Simethicone (GAS-X PO) Take 1 tablet by mouth as  needed.     vitamin C (ASCORBIC ACID) 500 MG tablet Take 500 mg by mouth 2 (two) times a week.      No current facility-administered medications for this visit.    PHYSICAL EXAMINATION:. BP (!) 173/76 (BP Location: Left Arm, Patient Position: Sitting) Comment: Nurse notified  Pulse 65   Temp 98.6 F (37 C) (Temporal)   Resp 17   Wt 108 lb 6.4 oz (49.2 kg)   SpO2 100%   BMI 21.17 kg/m   GENERAL:alert, in no acute distress and comfortable SKIN: no acute rashes, no significant lesions EYES: conjunctiva are pink and non-injected, sclera anicteric OROPHARYNX: MMM, no exudates, no oropharyngeal erythema or ulceration NECK: supple, no JVD LYMPH:  no palpable lymphadenopathy in the cervical, axillary or inguinal regions LUNGS: clear to auscultation b/l with normal respiratory effort HEART: regular rate & rhythm ABDOMEN:  normoactive bowel sounds , non tender, not distended. Extremity: no pedal edema PSYCH: alert & oriented x 3 with fluent speech NEURO: no focal motor/sensory deficits   LABORATORY DATA:     Latest Ref Rng & Units 02/16/2023   11:58 AM 01/19/2023   10:38 AM 12/22/2022    8:30 AM  CBC  WBC 4.0 - 10.5 K/uL 3.3  3.0  3.4   Hemoglobin 12.0 - 15.0 g/dL 16.1  09.6  04.5   Hematocrit 36.0 - 46.0 % 34.7  35.3  36.0   Platelets 150 - 400 K/uL 288  269  317       Latest Ref Rng & Units 02/16/2023   11:58 AM 01/19/2023   10:38 AM 12/22/2022    8:30 AM  CMP  Glucose 70 - 99 mg/dL 409  811  914   BUN 8 - 23 mg/dL 15  19  14    Creatinine 0.44 - 1.00 mg/dL 7.82  9.56  2.13   Sodium 135 - 145 mmol/L 139  139  139   Potassium 3.5 - 5.1 mmol/L 4.0  4.2  4.2   Chloride 98 - 111 mmol/L 107  106  106   CO2 22 - 32 mmol/L 27  27  26    Calcium 8.9 - 10.3 mg/dL 8.7  9.3  9.2   Total Protein 6.5 - 8.1 g/dL 6.6  6.9  6.7   Total Bilirubin 0.3 - 1.2 mg/dL 0.3  0.4  0.3   Alkaline Phos 38 - 126 U/L 29  28  32   AST 15 - 41  U/L 12  13  12    ALT 0 - 44 U/L 11  11  13     Component      Latest Ref Rng 12/22/2022  Alpha 1     0.0 - 0.4 g/dL 0.3 (C)  IgG (Immunoglobin G), Serum     586 - 1,602 mg/dL 098   IgM (Immunoglobulin M), Srm     26 - 217 mg/dL 10 (L)   Globulin, Total     2.2 - 3.9 g/dL 2.7 (C)  IgA     64 - 119 mg/dL 34 (L)   Total Protein ELP     6.0 - 8.5 g/dL 6.4 (C)  Albumin SerPl Elph-Mcnc     2.9 - 4.4 g/dL 3.7 (C)  Alpha2 Glob SerPl Elph-Mcnc     0.4 - 1.0 g/dL 0.7 (C)  B-Globulin SerPl Elph-Mcnc     0.7 - 1.3 g/dL 1.5 (H) (C)  Gamma Glob SerPl Elph-Mcnc     0.4 - 1.8 g/dL 0.2 (L) (C)  M Protein SerPl Elph-Mcnc     Not Observed g/dL 0.9 (H) (C)  Albumin/Glob SerPl     0.7 - 1.7  1.4 (C)  IFE 1 Comment ! (C)  Please Note (HCV): Comment (C)     RADIOGRAPHIC STUDIES: I have personally reviewed the radiological images as listed and agreed with the findings in the report. No results found.   ASSESSMENT & PLAN:   Megan Olson is a 82 y.o. female who presents for a follow up for multiple myeloma.   1. 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  She achieved complete response with negative M protein and negative PET scan in December 2020. -She subsequently went on Ninlaro maintenance therapy -PET scan from May 08, 2021, which showed focal activity within the right femur, consistent with active multiple myeloma, no other hypermetabolic lesion or plasmacytoma. -05/06/2021 bone marrow biopsy, which showed 3% plasma cells. -Recommend Zometa every 2 months.  2. Mild anemia -We will continue to monitor with myeloma treatment and current medication  3.  Cancer related pain  -Currently well controlled -Continue fentanyl patch at 12 mcg/h and Percocet for break through pain.    PLAN: -Discussed lab results from today, 02/16/2023, with the patient. CBC shows slightly decreased  WBC at 3.3 K, slightly decreased hemoglobin at 11.4 g/dL, and slightly decreased hematocrit at 34.7%. CMP is stable -Discussed Multiple Myeloma panel results from 01/19/2023 with the patient. Shows stable M-protein at 0.7, improved from 0.9. -Continue pre-treatment Dexamethasone 12 mg. -No new significant toxicities from a current plan of treatment with Dara/Pom/Dex at this time. -Patient can proceed with cycle 10 of her current Dara/Pom/Dex without any dose modifications. No Dexamethasone post-treatment. -Answered all of patient's questions.  -Continue Zometa every 8 weeks. No new dental issues.    FOLLOW-UP: Per integrated scheduling  The total time spent in the appointment was 30 minutes* .  All of the patient's questions were answered with apparent satisfaction. The patient knows to call the clinic with any problems, questions or concerns.   Wyvonnia Lora MD MS AAHIVMS [facility] Hematology/Oncology Physician [facility]  .*Total Encounter Time as defined by the Centers for Medicare and Medicaid Services includes, in addition to the face-to-face time of a patient visit (documented in the note above) non-face-to-face time: obtaining  and reviewing outside history, ordering and reviewing medications, tests or procedures, care coordination (communications with other health care professionals or caregivers) and documentation in the medical record.   I, Ok Edwards, am acting as a Neurosurgeon for Wyvonnia Lora, MD. .I have reviewed the above documentation for accuracy and completeness, and I agree with the above. Johney Maine MD

## 2023-02-16 NOTE — Progress Notes (Signed)
Per physician team,Patient seen by Dr. Addison Naegeli are within treatment parameters.  Labs reviewed: Corrected calcium is 8.78 - per Dr. Candise Che ok for Zometa today  Per physician team, patient is ready for treatment and there are NO modifications to the treatment plan.

## 2023-02-16 NOTE — Patient Instructions (Signed)
[facility]  Discharge Instructions: Thank you for choosing [facility] to provide your oncology and hematology care.   If you have a lab appointment with the Cancer Center, please go directly to the Cancer Center and check in at the registration area.   Wear comfortable clothing and clothing appropriate for easy access to any Portacath or PICC line.   We strive to give you quality time with your provider. You may need to reschedule your appointment if you arrive late (15 or more minutes).  Arriving late affects you and other patients whose appointments are after yours.  Also, if you miss three or more appointments without notifying the office, you may be dismissed from the clinic at the provider's discretion.      For prescription refill requests, have your pharmacy contact our office and allow 72 hours for refills to be completed.    Today you received the following chemotherapy and/or immunotherapy agents: Daratumumab.       To help prevent nausea and vomiting after your treatment, we encourage you to take your nausea medication as directed.  BELOW ARE SYMPTOMS THAT SHOULD BE REPORTED IMMEDIATELY: *FEVER GREATER THAN 100.4 F (38 C) OR HIGHER *CHILLS OR SWEATING *NAUSEA AND VOMITING THAT IS NOT CONTROLLED WITH YOUR NAUSEA MEDICATION *UNUSUAL SHORTNESS OF BREATH *UNUSUAL BRUISING OR BLEEDING *URINARY PROBLEMS (pain or burning when urinating, or frequent urination) *BOWEL PROBLEMS (unusual diarrhea, constipation, pain near the anus) TENDERNESS IN MOUTH AND THROAT WITH OR WITHOUT PRESENCE OF ULCERS (sore throat, sores in mouth, or a toothache) UNUSUAL RASH, SWELLING OR PAIN  UNUSUAL VAGINAL DISCHARGE OR ITCHING   Items with * indicate a potential emergency and should be followed up as soon as possible or go to the Emergency Department if any problems should occur.  Please show the CHEMOTHERAPY ALERT CARD or IMMUNOTHERAPY ALERT CARD at  check-in to the Emergency Department and triage nurse.  Should you have questions after your visit or need to cancel or reschedule your appointment, please contact [facility]  Dept: [phone]  and follow the prompts.  Office hours are 8:00 a.m. to 4:30 p.m. Monday - Friday. Please note that voicemails left after 4:00 p.m. may not be returned until the following business day.  We are closed weekends and major holidays. You have access to a nurse at all times for urgent questions. Please call the main number to the clinic Dept: [phone] and follow the prompts.   For any non-urgent questions, you may also contact your provider using MyChart. We now offer e-Visits for anyone 18 and older to request care online for non-urgent symptoms. For details visit [email or web address].   Also download the MyChart app! Go to the app store, search "MyChart", open the app, select [facility], and log in with your MyChart username and password.   

## 2023-02-16 NOTE — Progress Notes (Signed)
Ok for Zometa with corrected calcium 8.78 mg/dL

## 2023-02-19 LAB — MULTIPLE MYELOMA PANEL, SERUM
Albumin SerPl Elph-Mcnc: 3.7 g/dL (ref 2.9–4.4)
Albumin/Glob SerPl: 1.5 (ref 0.7–1.7)
Alpha 1: 0.2 g/dL (ref 0.0–0.4)
Alpha2 Glob SerPl Elph-Mcnc: 0.6 g/dL (ref 0.4–1.0)
B-Globulin SerPl Elph-Mcnc: 0.7 g/dL (ref 0.7–1.3)
Gamma Glob SerPl Elph-Mcnc: 1.1 g/dL (ref 0.4–1.8)
Globulin, Total: 2.6 g/dL (ref 2.2–3.9)
IgA: 28 mg/dL — ABNORMAL LOW (ref 64–422)
IgG (Immunoglobin G), Serum: 1227 mg/dL (ref 586–1602)
IgM (Immunoglobulin M), Srm: 7 mg/dL — ABNORMAL LOW (ref 26–217)
M Protein SerPl Elph-Mcnc: 0.8 g/dL — ABNORMAL HIGH
Total Protein ELP: 6.3 g/dL (ref 6.0–8.5)

## 2023-02-21 ENCOUNTER — Encounter: Payer: Self-pay | Admitting: Hematology

## 2023-03-04 ENCOUNTER — Telehealth: Payer: Self-pay | Admitting: Family Medicine

## 2023-03-04 ENCOUNTER — Other Ambulatory Visit: Payer: Self-pay

## 2023-03-04 ENCOUNTER — Encounter: Payer: Self-pay | Admitting: Family Medicine

## 2023-03-04 ENCOUNTER — Ambulatory Visit (INDEPENDENT_AMBULATORY_CARE_PROVIDER_SITE_OTHER): Payer: Medicare HMO | Admitting: Family Medicine

## 2023-03-04 VITALS — BP 160/70 | HR 60 | Temp 98.2°F | Ht 60.0 in | Wt 108.0 lb

## 2023-03-04 DIAGNOSIS — I10 Essential (primary) hypertension: Secondary | ICD-10-CM | POA: Diagnosis not present

## 2023-03-04 DIAGNOSIS — Z7189 Other specified counseling: Secondary | ICD-10-CM

## 2023-03-04 DIAGNOSIS — J029 Acute pharyngitis, unspecified: Secondary | ICD-10-CM | POA: Diagnosis not present

## 2023-03-04 DIAGNOSIS — C9002 Multiple myeloma in relapse: Secondary | ICD-10-CM

## 2023-03-04 LAB — POCT RAPID STREP A (OFFICE): Rapid Strep A Screen: NEGATIVE

## 2023-03-04 LAB — POC COVID19 BINAXNOW: SARS Coronavirus 2 Ag: NEGATIVE

## 2023-03-04 MED ORDER — SENNOSIDES-DOCUSATE SODIUM 8.6-50 MG PO TABS
2.0000 | ORAL_TABLET | Freq: Every day | ORAL | 0 refills | Status: DC
Start: 2023-03-04 — End: 2023-03-05

## 2023-03-04 MED ORDER — POMALIDOMIDE 2 MG PO CAPS
2.0000 mg | ORAL_CAPSULE | Freq: Every day | ORAL | 0 refills | Status: DC
Start: 2023-03-04 — End: 2023-04-02

## 2023-03-04 NOTE — Telephone Encounter (Signed)
Pt states she has cancer & is experiencing a sore throat. She is asking if [facility] Cal can fit her in today, 03/04/23. Please advise

## 2023-03-04 NOTE — Progress Notes (Signed)
Phone [phone] In person visit   Subjective:   Megan Olson is a 82 y.o. year old very pleasant female patient who presents for/with See problem oriented charting Chief Complaint  Patient presents with   Sore Throat    Pt c/o sore throat that started 3-4 days ago and she thinks its due to allergies/pollen. Denies fever.    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) 04/16/2021    Priority: High   Papillary thyroid carcinoma (HCC) 08/01/2020    Priority: High   Left thyroid nodule 06/18/2020    Priority: High   Multiple myeloma not having achieved remission (HCC) 12/12/2018    Priority: High   Prediabetes 06/18/2020    Priority: Medium    Pain in thoracic spine 11/03/2018    Priority: Medium    Primary open angle glaucoma (POAG) of right eye, moderate stage 10/11/2017    Priority: Medium    Hyperglycemia 03/29/2015    Priority: Medium    Hyperlipidemia 10/18/2014    Priority: Medium    Essential hypertension 03/11/2007    Priority: Medium    Counseling regarding advance care planning and goals of care 12/12/2018    Priority: Low   History of adenomatous polyp of colon 07/23/2015    Priority: Low   Raynaud phenomenon 10/19/2014    Priority: Low   Syncope 10/19/2014    Priority: Low   Glaucoma 01/05/2011    Priority: Low   Osteoporosis 03/11/2007    Priority: Low   Combined forms of age-related cataract of left eye 04/28/2017    Medications- reviewed and updated Current Outpatient Medications  Medication Sig Dispense Refill   acyclovir (ZOVIRAX) 400 MG tablet Take 1 tablet (400 mg total) by mouth 2 (two) times daily. 60 tablet 3   ALPHAGAN P 0.1 % SOLN Place 1 drop into both eyes in the morning, at noon, and at bedtime.      amLODipine (NORVASC) 10 MG tablet Take 1 tablet (10 mg total) by mouth daily. 90 tablet 3   aspirin EC 81 MG tablet  Take 81 mg by mouth daily.     Cholecalciferol (VITAMIN D3) 125 MCG (5000 UT) TABS Take 5,000 Units by mouth daily.     Co-Enzyme Q-10 100 MG CAPS Take 100 mg by mouth daily.     dexamethasone (DECADRON) 4 MG tablet Take 4 tablets (16 mg total) by mouth as directed. Take one hour prior to monthly Darzalex infusion. 12 tablet 3   dorzolamide-timolol (COSOPT) 22.3-6.8 MG/ML ophthalmic solution Place 1 drop into both eyes 2 (two) times daily.   11   fentaNYL (DURAGESIC) 12 MCG/HR Place 1 patch onto the skin every 3 (three) days. 10 patch 0   levothyroxine (SYNTHROID) 88 MCG tablet Take 1 tablet (88 mcg total) by mouth daily before breakfast. 90 tablet 3   lidocaine-prilocaine (EMLA) cream Apply to affected area once 30 g 3   magnesium chloride (SLOW-MAG) 64 MG TBEC SR tablet Take by mouth.     Multiple Vitamins-Minerals (MULTIVITAMIN WITH MINERALS) tablet Take 1 tablet by mouth daily. Centrum Silver 50 +     Netarsudil-Latanoprost (ROCKLATAN) 0.02-0.005 % SOLN Place 1 drop into both eyes daily.     Omega-3 1000 MG CAPS Take 1,000 mg by mouth daily.      ondansetron (ZOFRAN) 8 MG tablet Take 1 tablet (8 mg  total) by mouth every 8 (eight) hours as needed for nausea or vomiting. 30 tablet 1   polyethylene glycol (MIRALAX) packet Take 17 g by mouth daily. 30 each 1   pomalidomide (POMALYST) 2 MG capsule Take 1 capsule (2 mg total) by mouth daily. Take for 21 days on, 7 days off, repeat every 28 days 21 capsule 0   prochlorperazine (COMPAZINE) 10 MG tablet Take 1 tablet (10 mg total) by mouth every 6 (six) hours as needed for nausea or vomiting. 30 tablet 1   senna-docusate (SENOKOT-S) 8.6-50 MG tablet Take 2 tablets by mouth at bedtime. 60 tablet 0   Simethicone (GAS-X PO) Take 1 tablet by mouth as needed.     vitamin C (ASCORBIC ACID) 500 MG tablet Take 500 mg by mouth 2 (two) times a week.      oxyCODONE-acetaminophen (PERCOCET) 5-325 MG tablet Take 1-2 tablets by mouth every 4 (four) hours as needed  for moderate pain or severe pain. (Patient not taking: Reported on 03/04/2023) 90 tablet 0   No current facility-administered medications for this visit.     Objective:  BP (!) 160/70   Pulse 60   Temp 98.2 F (36.8 C)   Ht 5' (1.524 m)   Wt 108 lb (49 kg)   SpO2 96%   BMI 21.09 kg/m  Gen: NAD, resting comfortably Tympanic membrane's normal, pharynx with mild erythema and signs of drainage but oropharynx otherwise normal, no sinus tenderness, nasal turbinates mildly erythematous and edematous CV: RRR no murmurs rubs or gallops Lungs: CTAB no crackles, wheeze, rhonchi Ext: no edema Skin: warm, dry  Results for orders placed or performed in visit on 03/04/23 (from the past 24 hour(s))  POC COVID-19     Status: None   Collection Time: 03/04/23 11:54 AM  Result Value Ref Range   SARS Coronavirus 2 Ag Negative Negative  POCT rapid strep A     Status: None   Collection Time: 03/04/23 11:54 AM  Result Value Ref Range   Rapid Strep A Screen Negative Negative   *Note: Due to a large number of results and/or encounters for the requested time period, some results have not been displayed. A complete set of results can be found in Results Review.       Assessment and Plan   # sore throat  S:patient with sore throat starting 3 to 4 days ago- scratchy at first then by Wednesday started to get more sore/painful and would hurt with swallowing- still able to eat without difficulty but does feels ome discomfort.  At first, She was concerned could be allergy/pollen but with worsening wanted to get checked.  No fevers or chills. Occasional mild dry cough. no shortness of breath.  Sleeping ok.  No sinus pressure but mild runny nose at times - salt water gargles helps some or mint Listerine. Has tried extra vitamin C with zinc and eaten more oranges - no allergy medications  -years ago tried neil medication(s) sinus rinse without relief - not taking tylenol -symptoms have plateaued- not  worsening A/P: 82 year old female with multiple myeloma presenting with sore throat for 3 to 4 days that seem to worsen on Wednesday but appears stable so far today.  Could be allergies versus viral URI.  Her ophthalmologist has recommended against steroids if possible so we opted to not pursue Flonase -From AVS "  Patient Instructions  COVID and strep negative  Options to help with symptoms -trial tylenol 500 mg every 6-8 hours - trial  sore throat spray - continue lozenges/cough drops - could try hot tea with honey- can be soothing - be in room with hot shower running  I would say if symptoms persist into next week without any improvement id like to know- and certainly sooner if worsens - fever, thick yellow or green drainage, sinus pressure would be things I would want to know as soon as possible   Recommended follow up: Return for as needed for new, worsening, persistent symptoms. "  #hypertension S: medication: Amlodipine 10 mg Home readings #s: 120s to 130s/70s BP Readings from Last 3 Encounters:  03/04/23 (!) 160/70  02/16/23 (!) 174/79  02/16/23 (!) 173/76  A/P: Patient with whitecoat hypertension but blood pressure is very well-controlled at home-she did not have the most recent home reading on hand but in general her blood pressures have ranged from 120s to 130s over 70s so we opted not to change medicine at this time as well-controlled at home  Recommended follow up: Return for as needed for new, worsening, persistent symptoms. Future Appointments  Date Time Provider Department Center  03/16/2023 10:00 AM [facility] FLUSH [facility] None  03/16/2023 10:30 AM Johney Maine, MD [facility] None  03/16/2023 11:30 AM [facility] INFUSION [facility] None  01/26/2024  9:00 AM Carlus Pavlov, MD [facility] None  02/09/2024  9:00 AM Shelva Majestic, MD [facility]    Lab/Order associations:   ICD-10-CM   1. Sore throat  J02.9 POCT rapid strep A    POC COVID-19     2. Essential hypertension  I10      Return precautions advised.  Tana Conch, MD

## 2023-03-04 NOTE — Telephone Encounter (Signed)
See below

## 2023-03-04 NOTE — Telephone Encounter (Signed)
11: 40 but may have significant wait for me as we are full- id assume she wants strep test and COVID - team please complete before going to lunch

## 2023-03-04 NOTE — Patient Instructions (Addendum)
COVID and strep negative  Options to help with symptoms -trial tylenol 500 mg every 6-8 hours - trial sore throat spray - continue lozenges/cough drops - could try hot tea with honey- can be soothing - be in room with hot shower running  I would say if symptoms persist into next week without any improvement id like to know- and certainly sooner if worsens - fever, thick yellow or green drainage, sinus pressure would be things I would want to know as soon as possible   Recommended follow up: Return for as needed for new, worsening, persistent symptoms.

## 2023-03-05 ENCOUNTER — Other Ambulatory Visit: Payer: Self-pay | Admitting: Hematology

## 2023-03-05 DIAGNOSIS — C9002 Multiple myeloma in relapse: Secondary | ICD-10-CM

## 2023-03-12 DIAGNOSIS — Z0100 Encounter for examination of eyes and vision without abnormal findings: Secondary | ICD-10-CM | POA: Diagnosis not present

## 2023-03-15 ENCOUNTER — Other Ambulatory Visit: Payer: Self-pay

## 2023-03-15 DIAGNOSIS — C9000 Multiple myeloma not having achieved remission: Secondary | ICD-10-CM

## 2023-03-16 ENCOUNTER — Inpatient Hospital Stay: Payer: Medicare HMO

## 2023-03-16 ENCOUNTER — Other Ambulatory Visit: Payer: Self-pay

## 2023-03-16 ENCOUNTER — Telehealth: Payer: Self-pay | Admitting: Hematology

## 2023-03-16 ENCOUNTER — Encounter: Payer: Self-pay | Admitting: Hematology

## 2023-03-16 ENCOUNTER — Inpatient Hospital Stay: Payer: Medicare HMO | Attending: Hematology | Admitting: Hematology

## 2023-03-16 VITALS — BP 137/82 | HR 57 | Temp 98.2°F | Resp 18 | Ht 60.0 in | Wt 106.9 lb

## 2023-03-16 DIAGNOSIS — Z95828 Presence of other vascular implants and grafts: Secondary | ICD-10-CM

## 2023-03-16 DIAGNOSIS — C9000 Multiple myeloma not having achieved remission: Secondary | ICD-10-CM

## 2023-03-16 DIAGNOSIS — Z7189 Other specified counseling: Secondary | ICD-10-CM

## 2023-03-16 DIAGNOSIS — Z5112 Encounter for antineoplastic immunotherapy: Secondary | ICD-10-CM | POA: Diagnosis present

## 2023-03-16 DIAGNOSIS — C9002 Multiple myeloma in relapse: Secondary | ICD-10-CM

## 2023-03-16 DIAGNOSIS — J069 Acute upper respiratory infection, unspecified: Secondary | ICD-10-CM

## 2023-03-16 DIAGNOSIS — D649 Anemia, unspecified: Secondary | ICD-10-CM | POA: Diagnosis not present

## 2023-03-16 LAB — CBC WITH DIFFERENTIAL (CANCER CENTER ONLY)
Abs Immature Granulocytes: 0.01 10*3/uL (ref 0.00–0.07)
Basophils Absolute: 0.1 10*3/uL (ref 0.0–0.1)
Basophils Relative: 4 %
Eosinophils Absolute: 0.1 10*3/uL (ref 0.0–0.5)
Eosinophils Relative: 2 %
HCT: 33.9 % — ABNORMAL LOW (ref 36.0–46.0)
Hemoglobin: 11.5 g/dL — ABNORMAL LOW (ref 12.0–15.0)
Immature Granulocytes: 0 %
Lymphocytes Relative: 9 %
Lymphs Abs: 0.2 10*3/uL — ABNORMAL LOW (ref 0.7–4.0)
MCH: 33 pg (ref 26.0–34.0)
MCHC: 33.9 g/dL (ref 30.0–36.0)
MCV: 97.1 fL (ref 80.0–100.0)
Monocytes Absolute: 0.8 10*3/uL (ref 0.1–1.0)
Monocytes Relative: 30 %
Neutro Abs: 1.4 10*3/uL — ABNORMAL LOW (ref 1.7–7.7)
Neutrophils Relative %: 55 %
Platelet Count: 246 10*3/uL (ref 150–400)
RBC: 3.49 MIL/uL — ABNORMAL LOW (ref 3.87–5.11)
RDW: 15.6 % — ABNORMAL HIGH (ref 11.5–15.5)
WBC Count: 2.6 10*3/uL — ABNORMAL LOW (ref 4.0–10.5)
nRBC: 0 % (ref 0.0–0.2)

## 2023-03-16 LAB — CMP (CANCER CENTER ONLY)
ALT: 11 U/L (ref 0–44)
AST: 12 U/L — ABNORMAL LOW (ref 15–41)
Albumin: 4 g/dL (ref 3.5–5.0)
Alkaline Phosphatase: 32 U/L — ABNORMAL LOW (ref 38–126)
Anion gap: 5 (ref 5–15)
BUN: 16 mg/dL (ref 8–23)
CO2: 26 mmol/L (ref 22–32)
Calcium: 8.8 mg/dL — ABNORMAL LOW (ref 8.9–10.3)
Chloride: 106 mmol/L (ref 98–111)
Creatinine: 0.67 mg/dL (ref 0.44–1.00)
GFR, Estimated: >60 mL/min (ref >60)
Glucose, Bld: 106 mg/dL — ABNORMAL HIGH (ref 70–99)
Potassium: 4.3 mmol/L (ref 3.5–5.1)
Sodium: 137 mmol/L (ref 135–145)
Total Bilirubin: 0.3 mg/dL (ref 0.3–1.2)
Total Protein: 7.2 g/dL (ref 6.5–8.1)

## 2023-03-16 MED ORDER — DOXYCYCLINE HYCLATE 100 MG PO TABS: 100.0000 mg | ORAL_TABLET | Freq: Two times a day (BID) | ORAL | 0 refills | Status: AC

## 2023-03-16 MED ORDER — SODIUM CHLORIDE 0.9% FLUSH
10.0000 mL | Freq: Once | INTRAVENOUS | Status: AC
  Administered 2023-03-16: 10 mL

## 2023-03-16 MED ORDER — FENTANYL 12 MCG/HR TD PT72
1.0000 | MEDICATED_PATCH | TRANSDERMAL | 0 refills | Status: DC
Start: 2023-03-16 — End: 2023-04-15

## 2023-03-16 NOTE — Progress Notes (Signed)
[facility]   [phone] [phone]   Clinic Follow up Note   Patient Care Team: Shelva Majestic, MD as PCP - General (Family Medicine) Johney Maine, MD as Consulting Physician (Hematology) Bond, Doran Stabler, MD as Referring Physician (Ophthalmology) Zigmund Daniel, Maureen Ralphs, MD as Consulting Physician (Hematology and Oncology) Carlus Pavlov, MD as Consulting Physician (Internal Medicine) Darnell Level, MD as Consulting Physician (General Surgery) Erroll Luna, [facility] as Pharmacist (Pharmacist)  Date of Service: 03/16/2023  CHIEF COMPLAINT:  Follow-up for continuation and management of multiple myeloma   SUMMARY OF ONCOLOGIC HISTORY: Oncology History  Multiple myeloma not having achieved remission (HCC)  12/12/2018 Initial Diagnosis   Multiple myeloma not having achieved remission (HCC)   12/20/2018 - 10/04/2019 Chemotherapy   The patient had dexamethasone (DECADRON) tablet 20 mg, 20 mg (100 % of original dose 20 mg), Oral, Once, 14 of 14 cycles Dose modification: 20 mg (original dose 20 mg, Cycle 1), 10 mg (original dose 20 mg, Cycle 14) Administration: 20 mg (12/20/2018), 20 mg (12/27/2018), 20 mg (01/10/2019), 20 mg (01/17/2019), 20 mg (01/31/2019), 20 mg (02/07/2019), 20 mg (03/14/2019), 20 mg (03/21/2019), 20 mg (02/21/2019), 20 mg (02/28/2019), 20 mg (04/04/2019), 20 mg (04/11/2019), 20 mg (04/25/2019), 20 mg (05/02/2019), 20 mg (05/16/2019), 20 mg (05/23/2019), 20 mg (06/06/2019), 20 mg (06/13/2019), 20 mg (06/27/2019), 20 mg (07/04/2019), 20 mg (07/18/2019), 20 mg (07/25/2019), 20 mg (08/08/2019), 20 mg (08/15/2019), 20 mg (08/29/2019), 20 mg (09/05/2019), 10 mg (09/19/2019) lenalidomide (REVLIMID) 15 MG capsule, 1 of 1 cycle, Start date: 01/18/2019, End date: 02/21/2019 bortezomib SQ (VELCADE) chemo injection 2 mg, 1.3 mg/m2 = 2 mg, Subcutaneous,  Once, 14 of 14 cycles Administration: 2 mg (12/20/2018), 2 mg (12/27/2018), 2 mg (12/23/2018), 2 mg (12/30/2018),  2 mg (01/10/2019), 2 mg (01/17/2019), 2 mg (01/13/2019), 2 mg (01/20/2019), 2 mg (01/31/2019), 2 mg (02/07/2019), 2 mg (02/03/2019), 2 mg (02/10/2019), 2 mg (03/14/2019), 2 mg (03/17/2019), 2 mg (03/21/2019), 2 mg (03/24/2019), 2 mg (02/21/2019), 2 mg (02/24/2019), 2 mg (02/28/2019), 2 mg (03/03/2019), 2 mg (04/04/2019), 2 mg (04/06/2019), 2 mg (04/11/2019), 2 mg (04/14/2019), 2 mg (04/25/2019), 2 mg (04/28/2019), 2 mg (05/02/2019), 2 mg (05/05/2019), 2 mg (05/16/2019), 2 mg (05/19/2019), 2 mg (05/23/2019), 2 mg (05/26/2019), 2 mg (06/06/2019), 2 mg (06/09/2019), 2 mg (06/13/2019), 2 mg (06/16/2019), 2 mg (06/27/2019), 2 mg (06/30/2019), 2 mg (07/04/2019), 2 mg (07/07/2019), 2 mg (07/18/2019), 2 mg (07/21/2019), 2 mg (07/25/2019), 2 mg (07/28/2019), 2 mg (08/08/2019), 2 mg (08/11/2019), 2 mg (08/15/2019), 2 mg (08/18/2019), 2 mg (08/29/2019), 2 mg (09/01/2019), 2 mg (09/05/2019), 2 mg (09/08/2019), 2 mg (09/19/2019), 2 mg (10/04/2019)  for chemotherapy treatment.    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)  04/16/2021 Initial Diagnosis   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)   04/30/2021 - 04/15/2022 Chemotherapy   Patient is on Treatment Plan : MYELOMA RELAPSED/REFRACTORY KCd q28d     05/12/2022 - 07/08/2022 Chemotherapy   Patient is on Treatment Plan : MYELOMA Daratumumab + Pomalidomide + Dexamethasone q28d x 7 cycles     05/22/2022 -  Chemotherapy   Patient is on Treatment Plan : MYELOMA Daratumumab IV + Pomalidomide + Dexamethasone q28d x 7 cycles       CURRENT THERAPY: Dara/Pom/Dex  INTERVAL HISTORY:  [name] is a 82 y.o. female returns for continued evaluation and management of multiple myeloma. He is here to start cycle 12 of her DPD treatment. We will hold cycle 12 during this visit and delay  it for one week due to respiratory infection.   Patient was last seen by me on 02/16/2023 and she complained of occasional right shoulder pain which occasionally radiates to her hands, but otherwise was doing well overall.   Patient notes she has been  doing well overall since our last visit. She complains of nasal congestion, sneezing, sore throat, and mild cough. She notes she has been to her PCP last week regarding this symptoms and tested negative for COVID-19, strep throat, and influenza.   She denies fever, chills, night sweats, unexpected weight loss, abdominal pain, chest pain, back pain, or leg swelling. Patient also complains of mild jaw pain with touch, which she first noticed around one week ago. She denies jaw pain while chewing. She had dental exam in April 2024.   Patient notes that the skin lesion near her upper back is tender and has slightly grown in size. She has not been to her dermatologist, yet.   Patient is allergic to Penicillins.   MEDICAL HISTORY:  Past Medical History:  Diagnosis Date   Anemia    Glaucoma    HYPERTENSION 03/11/2007   Multiple myeloma (HCC)    OSTEOPENIA 03/11/2007   Pre-diabetes    Thyroid cancer (HCC)    Thyroid disease    Tubular adenoma of colon 07/2015    SURGICAL HISTORY: Past Surgical History:  Procedure Laterality Date   BONE MARROW BIOPSY     multiple   BREAST EXCISIONAL BIOPSY Right 2000   BREAST LUMPECTOMY  1990   benign   CATARACT EXTRACTION Bilateral 2018   DILATION AND CURETTAGE OF UTERUS     bleeding at menopause. No uterine cancer   IR IMAGING GUIDED PORT INSERTION  05/16/2021   IR RADIOLOGIST EVAL & MGMT  12/13/2018   THYROIDECTOMY N/A 08/02/2020   Procedure: TOTAL THYROIDECTOMY;  Surgeon: Darnell Level, MD;  Location: [facility];  Service: General;  Laterality: N/A;   TONSILLECTOMY     age 25   I have reviewed the social history and family history with the patient and they are unchanged from previous note.  ALLERGIES:  is allergic to ace inhibitors, benadryl [diphenhydramine], diamox [acetazolamide], sulfamethoxazole, lenalidomide, and penicillins.  MEDICATIONS:  Current Outpatient Medications  Medication Sig Dispense Refill   acyclovir (ZOVIRAX) 400 MG tablet Take 1  tablet (400 mg total) by mouth 2 (two) times daily. 60 tablet 3   ALPHAGAN P 0.1 % SOLN Place 1 drop into both eyes in the morning, at noon, and at bedtime.      Megan Olson (NORVASC) 10 MG tablet Take 1 tablet (10 mg total) by mouth daily. 90 tablet 3   aspirin EC 81 MG tablet Take 81 mg by mouth daily.     Cholecalciferol (VITAMIN D3) 125 MCG (5000 UT) TABS Take 5,000 Units by mouth daily.     Co-Enzyme Q-10 100 MG CAPS Take 100 mg by mouth daily.     dexamethasone (DECADRON) 4 MG tablet Take 4 tablets (16 mg total) by mouth as directed. Take one hour prior to monthly Darzalex infusion. 12 tablet 3   dorzolamide-timolol (COSOPT) 22.3-6.8 MG/ML ophthalmic solution Place 1 drop into both eyes 2 (two) times daily.   11   EQ STOOL SOFTENER/LAXATIVE 8.6-50 MG tablet TAKE 2 TABLETS BY MOUTH AT BEDTIME 60 tablet 0   fentaNYL (DURAGESIC) 12 MCG/HR Place 1 patch onto the skin every 3 (three) days. 10 patch 0   levothyroxine (SYNTHROID) 88 MCG tablet Take 1 tablet (88 mcg total)  by mouth daily before breakfast. 90 tablet 3   lidocaine-prilocaine (EMLA) cream Apply to affected area once 30 g 3   magnesium chloride (SLOW-MAG) 64 MG TBEC SR tablet Take by mouth.     Multiple Vitamins-Minerals (MULTIVITAMIN WITH MINERALS) tablet Take 1 tablet by mouth daily. Centrum Silver 50 +     Netarsudil-Latanoprost (ROCKLATAN) 0.02-0.005 % SOLN Place 1 drop into both eyes daily.     Omega-3 1000 MG CAPS Take 1,000 mg by mouth daily.      ondansetron (ZOFRAN) 8 MG tablet Take 1 tablet (8 mg total) by mouth every 8 (eight) hours as needed for nausea or vomiting. 30 tablet 1   oxyCODONE-acetaminophen (PERCOCET) 5-325 MG tablet Take 1-2 tablets by mouth every 4 (four) hours as needed for moderate pain or severe pain. (Patient not taking: Reported on 03/04/2023) 90 tablet 0   polyethylene glycol (MIRALAX) packet Take 17 g by mouth daily. 30 each 1   pomalidomide (POMALYST) 2 MG capsule Take 1 capsule (2 mg total) by mouth  daily. Take for 21 days on, 7 days off, repeat every 28 days 21 capsule 0   prochlorperazine (COMPAZINE) 10 MG tablet Take 1 tablet (10 mg total) by mouth every 6 (six) hours as needed for nausea or vomiting. 30 tablet 1   Simethicone (GAS-X PO) Take 1 tablet by mouth as needed.     vitamin C (ASCORBIC ACID) 500 MG tablet Take 500 mg by mouth 2 (two) times a week.      No current facility-administered medications for this visit.    PHYSICAL EXAMINATION:. BP 137/82 (BP Location: Right Arm, Patient Position: Sitting)   Pulse (!) 57   Temp 98.2 F (36.8 C) (Oral)   Resp 18   Ht 5' (1.524 m)   Wt 106 lb 14.4 oz (48.5 kg)   SpO2 98%   BMI 20.88 kg/m   GENERAL:alert, in no acute distress and comfortable SKIN: no acute rashes, no significant lesions EYES: conjunctiva are pink and non-injected, sclera anicteric OROPHARYNX: MMM, no exudates, no oropharyngeal erythema or ulceration NECK: supple, no JVD LYMPH:  no palpable lymphadenopathy in the cervical, axillary or inguinal regions LUNGS: clear to auscultation b/l with normal respiratory effort HEART: regular rate & rhythm ABDOMEN:  normoactive bowel sounds , non tender, not distended. Extremity: no pedal edema PSYCH: alert & oriented x 3 with fluent speech NEURO: no focal motor/sensory deficits   LABORATORY DATA:     Latest Ref Rng & Units 03/16/2023    9:58 AM 02/16/2023   11:58 AM 01/19/2023   10:38 AM  CBC  WBC 4.0 - 10.5 K/uL 2.6  3.3  3.0   Hemoglobin 12.0 - 15.0 g/dL 16.1  09.6  04.5   Hematocrit 36.0 - 46.0 % 33.9  34.7  35.3   Platelets 150 - 400 K/uL 246  288  269       Latest Ref Rng & Units 03/16/2023    9:58 AM 02/16/2023   11:58 AM 01/19/2023   10:38 AM  CMP  Glucose 70 - 99 mg/dL 409  811  914   BUN 8 - 23 mg/dL 16  15  19    Creatinine 0.44 - 1.00 mg/dL 7.82  9.56  2.13   Sodium 135 - 145 mmol/L 137  139  139   Potassium 3.5 - 5.1 mmol/L 4.3  4.0  4.2   Chloride 98 - 111 mmol/L 106  107  106   CO2 22 - 32  mmol/L  26  27  27    Calcium 8.9 - 10.3 mg/dL 8.8  8.7  9.3   Total Protein 6.5 - 8.1 g/dL 7.2  6.6  6.9   Total Bilirubin 0.3 - 1.2 mg/dL 0.3  0.3  0.4   Alkaline Phos 38 - 126 U/L 32  29  28   AST 15 - 41 U/L 12  12  13    ALT 0 - 44 U/L 11  11  11     Component     Latest Ref Rng 12/22/2022  Alpha 1     0.0 - 0.4 g/dL 0.3 (C)  IgG (Immunoglobin G), Serum     586 - 1,602 mg/dL 865   IgM (Immunoglobulin M), Srm     26 - 217 mg/dL 10 (L)   Globulin, Total     2.2 - 3.9 g/dL 2.7 (C)  IgA     64 - 784 mg/dL 34 (L)   Total Protein ELP     6.0 - 8.5 g/dL 6.4 (C)  Albumin SerPl Elph-Mcnc     2.9 - 4.4 g/dL 3.7 (C)  Alpha2 Glob SerPl Elph-Mcnc     0.4 - 1.0 g/dL 0.7 (C)  B-Globulin SerPl Elph-Mcnc     0.7 - 1.3 g/dL 1.5 (H) (C)  Gamma Glob SerPl Elph-Mcnc     0.4 - 1.8 g/dL 0.2 (L) (C)  M Protein SerPl Elph-Mcnc     Not Observed g/dL 0.9 (H) (C)  Albumin/Glob SerPl     0.7 - 1.7  1.4 (C)  IFE 1 Comment ! (C)  Please Note (HCV): Comment (C)     RADIOGRAPHIC STUDIES: I have personally reviewed the radiological images as listed and agreed with the findings in the report. No results found.   ASSESSMENT & PLAN:   AMARYLIS DONAN is a 82 y.o. female who presents for a follow up for multiple myeloma.   1. 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  She achieved complete response with negative M protein and negative PET scan in December 2020. -She subsequently went on Ninlaro maintenance therapy -PET scan from May 08, 2021, which showed focal activity within the right femur, consistent with active multiple myeloma, no other hypermetabolic lesion or plasmacytoma. -05/06/2021 bone marrow biopsy, which showed 3% plasma cells. -Recommend Zometa every 2 months.  2. Mild anemia -We will continue to monitor with myeloma  treatment and current medication  3.  Cancer related pain  -Currently well controlled -Continue fentanyl patch at 12 mcg/h and Percocet for break through pain.    PLAN: -Discussed lab results from today, 03/16/2023, with the patient. CBC shows slight decreased WBC at 2.6 K, decreased hemoglobin at 11.5 g/dL, and slightly decreased hematocrit at 33.9%. CMP is pending.   -Discussed with the patient that the skin lesion near her upper back is probably cyst. We will wait and see if it improves with antibiotics.  -No new significant toxicities from a current plan of treatment with Dara/Pom/Dex at this time. -However, we will hold cycle 12 of her treatment today due to respiratory infection. Delay it for one week.  -Will prescribe antibiotics for her respiratory infection.    FOLLOW-UP: -Plz reschedule daratumumab from 6/11 to 6/18 due to URI . Patient prefers to keep Tuesdays for her treatment. -subsequent treatments per integrated scheduling -RTC with Dr  Klark Vanderhoef in 4 weeks with her treatment.  The total time spent in the appointment was 30 minutes* .  All of the patient's questions were answered with apparent satisfaction. The patient knows to call the clinic with any problems, questions or concerns.   Wyvonnia Lora MD MS AAHIVMS [facility] Hematology/Oncology Physician [facility]  .*Total Encounter Time as defined by the Centers for Medicare and Medicaid Services includes, in addition to the face-to-face time of a patient visit (documented in the note above) non-face-to-face time: obtaining and reviewing outside history, ordering and reviewing medications, tests or procedures, care coordination (communications with other health care professionals or caregivers) and documentation in the medical record.   I, Ok Edwards, am acting as a Neurosurgeon for Wyvonnia Lora, MD. .I have reviewed the above documentation for accuracy and completeness, and I agree with the above.  Johney Maine MD

## 2023-03-16 NOTE — Progress Notes (Signed)
Patient seen by Dr. Addison Naegeli are within treatment parameters.  Labs reviewed: and are within treatment parameters.  Per physician team, patient will not be receiving treatment today.  Pt has a cold we will push out tx for a week.

## 2023-03-22 ENCOUNTER — Encounter: Payer: Self-pay | Admitting: Hematology

## 2023-03-22 LAB — MULTIPLE MYELOMA PANEL, SERUM
Albumin SerPl Elph-Mcnc: 3.7 g/dL (ref 2.9–4.4)
Albumin/Glob SerPl: 1.3 (ref 0.7–1.7)
Alpha 1: 0.2 g/dL (ref 0.0–0.4)
Alpha2 Glob SerPl Elph-Mcnc: 0.8 g/dL (ref 0.4–1.0)
B-Globulin SerPl Elph-Mcnc: 1.8 g/dL — ABNORMAL HIGH (ref 0.7–1.3)
Gamma Glob SerPl Elph-Mcnc: 0.1 g/dL — ABNORMAL LOW (ref 0.4–1.8)
Globulin, Total: 2.9 g/dL (ref 2.2–3.9)
IgA: 27 mg/dL — ABNORMAL LOW (ref 64–422)
IgG (Immunoglobin G), Serum: 1620 mg/dL — ABNORMAL HIGH (ref 586–1602)
IgM (Immunoglobulin M), Srm: 9 mg/dL — ABNORMAL LOW (ref 26–217)
M Protein SerPl Elph-Mcnc: 1.2 g/dL — ABNORMAL HIGH
Total Protein ELP: 6.6 g/dL (ref 6.0–8.5)

## 2023-03-23 ENCOUNTER — Other Ambulatory Visit: Payer: Self-pay

## 2023-03-23 ENCOUNTER — Ambulatory Visit: Payer: Medicare HMO

## 2023-03-23 ENCOUNTER — Other Ambulatory Visit: Payer: Medicare HMO

## 2023-03-23 ENCOUNTER — Inpatient Hospital Stay: Payer: Medicare HMO

## 2023-03-23 VITALS — BP 172/78 | HR 72 | Temp 98.2°F | Resp 17 | Wt 105.0 lb

## 2023-03-23 DIAGNOSIS — Z5112 Encounter for antineoplastic immunotherapy: Secondary | ICD-10-CM | POA: Diagnosis not present

## 2023-03-23 DIAGNOSIS — Z95828 Presence of other vascular implants and grafts: Secondary | ICD-10-CM

## 2023-03-23 DIAGNOSIS — D649 Anemia, unspecified: Secondary | ICD-10-CM | POA: Diagnosis not present

## 2023-03-23 DIAGNOSIS — C9002 Multiple myeloma in relapse: Secondary | ICD-10-CM

## 2023-03-23 DIAGNOSIS — Z7189 Other specified counseling: Secondary | ICD-10-CM

## 2023-03-23 DIAGNOSIS — C9000 Multiple myeloma not having achieved remission: Secondary | ICD-10-CM

## 2023-03-23 LAB — CMP (CANCER CENTER ONLY)
ALT: 11 U/L (ref 0–44)
AST: 14 U/L — ABNORMAL LOW (ref 15–41)
Albumin: 3.5 g/dL (ref 3.5–5.0)
Alkaline Phosphatase: 33 U/L — ABNORMAL LOW (ref 38–126)
Anion gap: 6 (ref 5–15)
BUN: 16 mg/dL (ref 8–23)
CO2: 26 mmol/L (ref 22–32)
Calcium: 8.8 mg/dL — ABNORMAL LOW (ref 8.9–10.3)
Chloride: 103 mmol/L (ref 98–111)
Creatinine: 0.61 mg/dL (ref 0.44–1.00)
GFR, Estimated: >60 mL/min (ref >60)
Glucose, Bld: 167 mg/dL — ABNORMAL HIGH (ref 70–99)
Potassium: 4.3 mmol/L (ref 3.5–5.1)
Sodium: 135 mmol/L (ref 135–145)
Total Bilirubin: 0.3 mg/dL (ref 0.3–1.2)
Total Protein: 6.4 g/dL — ABNORMAL LOW (ref 6.5–8.1)

## 2023-03-23 LAB — CBC WITH DIFFERENTIAL (CANCER CENTER ONLY)
Abs Immature Granulocytes: 0.01 10*3/uL (ref 0.00–0.07)
Basophils Absolute: 0.1 10*3/uL (ref 0.0–0.1)
Basophils Relative: 2 %
Eosinophils Absolute: 0 10*3/uL (ref 0.0–0.5)
Eosinophils Relative: 1 %
HCT: 32.7 % — ABNORMAL LOW (ref 36.0–46.0)
Hemoglobin: 11.2 g/dL — ABNORMAL LOW (ref 12.0–15.0)
Immature Granulocytes: 0 %
Lymphocytes Relative: 12 %
Lymphs Abs: 0.5 10*3/uL — ABNORMAL LOW (ref 0.7–4.0)
MCH: 32.7 pg (ref 26.0–34.0)
MCHC: 34.3 g/dL (ref 30.0–36.0)
MCV: 95.6 fL (ref 80.0–100.0)
Monocytes Absolute: 0.4 10*3/uL (ref 0.1–1.0)
Monocytes Relative: 10 %
Neutro Abs: 3.3 10*3/uL (ref 1.7–7.7)
Neutrophils Relative %: 75 %
Platelet Count: 201 10*3/uL (ref 150–400)
RBC: 3.42 MIL/uL — ABNORMAL LOW (ref 3.87–5.11)
RDW: 15.3 % (ref 11.5–15.5)
WBC Count: 4.4 10*3/uL (ref 4.0–10.5)
nRBC: 0 % (ref 0.0–0.2)

## 2023-03-23 MED ORDER — SODIUM CHLORIDE 0.9 % IV SOLN
16.0000 mg/kg | Freq: Once | INTRAVENOUS | Status: AC
  Administered 2023-03-23: 800 mg via INTRAVENOUS
  Filled 2023-03-23: qty 40

## 2023-03-23 MED ORDER — FAMOTIDINE IN NACL 20-0.9 MG/50ML-% IV SOLN
20.0000 mg | Freq: Once | INTRAVENOUS | Status: AC
  Administered 2023-03-23: 20 mg via INTRAVENOUS
  Filled 2023-03-23: qty 50

## 2023-03-23 MED ORDER — SODIUM CHLORIDE 0.9% FLUSH
10.0000 mL | Freq: Once | INTRAVENOUS | Status: AC
  Administered 2023-03-23: 10 mL

## 2023-03-23 MED ORDER — ACETAMINOPHEN 325 MG PO TABS
650.0000 mg | ORAL_TABLET | Freq: Once | ORAL | Status: AC
  Administered 2023-03-23: 650 mg via ORAL
  Filled 2023-03-23: qty 2

## 2023-03-23 MED ORDER — SODIUM CHLORIDE 0.9 % IV SOLN: Freq: Once | INTRAVENOUS | Status: AC

## 2023-03-23 MED ORDER — HYDROXYZINE HCL 25 MG PO TABS
25.0000 mg | ORAL_TABLET | Freq: Once | ORAL | Status: AC
  Administered 2023-03-23: 25 mg via ORAL
  Filled 2023-03-23: qty 1

## 2023-03-26 ENCOUNTER — Ambulatory Visit: Payer: Medicare HMO

## 2023-03-26 ENCOUNTER — Other Ambulatory Visit: Payer: Self-pay

## 2023-03-26 ENCOUNTER — Other Ambulatory Visit: Payer: Medicare HMO

## 2023-03-26 DIAGNOSIS — C9002 Multiple myeloma in relapse: Secondary | ICD-10-CM

## 2023-03-26 DIAGNOSIS — Z7189 Other specified counseling: Secondary | ICD-10-CM

## 2023-03-26 MED ORDER — ACYCLOVIR 400 MG PO TABS
400.0000 mg | ORAL_TABLET | Freq: Two times a day (BID) | ORAL | 3 refills | Status: DC
Start: 2023-03-26 — End: 2024-07-26

## 2023-04-02 ENCOUNTER — Other Ambulatory Visit: Payer: Self-pay

## 2023-04-02 DIAGNOSIS — Z7189 Other specified counseling: Secondary | ICD-10-CM

## 2023-04-02 DIAGNOSIS — C9002 Multiple myeloma in relapse: Secondary | ICD-10-CM

## 2023-04-02 MED ORDER — POMALIDOMIDE 2 MG PO CAPS
2.0000 mg | ORAL_CAPSULE | Freq: Every day | ORAL | 0 refills | Status: DC
Start: 2023-04-02 — End: 2023-04-20

## 2023-04-03 ENCOUNTER — Other Ambulatory Visit: Payer: Self-pay

## 2023-04-15 ENCOUNTER — Other Ambulatory Visit: Payer: Self-pay

## 2023-04-15 DIAGNOSIS — Z7189 Other specified counseling: Secondary | ICD-10-CM

## 2023-04-15 DIAGNOSIS — C9000 Multiple myeloma not having achieved remission: Secondary | ICD-10-CM

## 2023-04-15 DIAGNOSIS — C9002 Multiple myeloma in relapse: Secondary | ICD-10-CM

## 2023-04-15 MED ORDER — LIDOCAINE-PRILOCAINE 2.5-2.5 % EX CREA
TOPICAL_CREAM | CUTANEOUS | 3 refills | Status: AC
Start: 2023-04-15 — End: ?

## 2023-04-16 ENCOUNTER — Encounter: Payer: Self-pay | Admitting: Hematology

## 2023-04-16 MED ORDER — FENTANYL 12 MCG/HR TD PT72
1.0000 | MEDICATED_PATCH | TRANSDERMAL | 0 refills | Status: DC
Start: 2023-04-16 — End: 2023-05-13

## 2023-04-20 ENCOUNTER — Other Ambulatory Visit: Payer: Self-pay

## 2023-04-20 ENCOUNTER — Ambulatory Visit: Payer: Medicare HMO | Admitting: Hematology

## 2023-04-20 ENCOUNTER — Inpatient Hospital Stay: Payer: Medicare HMO

## 2023-04-20 ENCOUNTER — Other Ambulatory Visit: Payer: Medicare HMO

## 2023-04-20 ENCOUNTER — Inpatient Hospital Stay: Payer: Medicare HMO | Admitting: Hematology

## 2023-04-20 ENCOUNTER — Inpatient Hospital Stay: Payer: Medicare HMO | Attending: Hematology

## 2023-04-20 ENCOUNTER — Ambulatory Visit: Payer: Medicare HMO

## 2023-04-20 VITALS — BP 174/73 | HR 66 | Temp 97.7°F | Resp 18 | Wt 106.8 lb

## 2023-04-20 VITALS — BP 153/80 | HR 73 | Temp 97.7°F | Resp 14

## 2023-04-20 DIAGNOSIS — C9002 Multiple myeloma in relapse: Secondary | ICD-10-CM

## 2023-04-20 DIAGNOSIS — C9000 Multiple myeloma not having achieved remission: Secondary | ICD-10-CM

## 2023-04-20 DIAGNOSIS — Z5112 Encounter for antineoplastic immunotherapy: Secondary | ICD-10-CM | POA: Insufficient documentation

## 2023-04-20 DIAGNOSIS — Z7189 Other specified counseling: Secondary | ICD-10-CM

## 2023-04-20 DIAGNOSIS — G500 Trigeminal neuralgia: Secondary | ICD-10-CM | POA: Diagnosis not present

## 2023-04-20 DIAGNOSIS — Z79891 Long term (current) use of opiate analgesic: Secondary | ICD-10-CM | POA: Insufficient documentation

## 2023-04-20 DIAGNOSIS — Z9221 Personal history of antineoplastic chemotherapy: Secondary | ICD-10-CM | POA: Diagnosis not present

## 2023-04-20 DIAGNOSIS — Z79899 Other long term (current) drug therapy: Secondary | ICD-10-CM | POA: Insufficient documentation

## 2023-04-20 DIAGNOSIS — Z5111 Encounter for antineoplastic chemotherapy: Secondary | ICD-10-CM

## 2023-04-20 DIAGNOSIS — G893 Neoplasm related pain (acute) (chronic): Secondary | ICD-10-CM | POA: Insufficient documentation

## 2023-04-20 DIAGNOSIS — D649 Anemia, unspecified: Secondary | ICD-10-CM | POA: Insufficient documentation

## 2023-04-20 DIAGNOSIS — Z95828 Presence of other vascular implants and grafts: Secondary | ICD-10-CM

## 2023-04-20 LAB — CBC WITH DIFFERENTIAL (CANCER CENTER ONLY)
Abs Immature Granulocytes: 0.01 10*3/uL (ref 0.00–0.07)
Basophils Absolute: 0.2 10*3/uL — ABNORMAL HIGH (ref 0.0–0.1)
Basophils Relative: 5 %
Eosinophils Absolute: 0.1 10*3/uL (ref 0.0–0.5)
Eosinophils Relative: 3 %
HCT: 34.2 % — ABNORMAL LOW (ref 36.0–46.0)
Hemoglobin: 11.3 g/dL — ABNORMAL LOW (ref 12.0–15.0)
Immature Granulocytes: 0 %
Lymphocytes Relative: 16 %
Lymphs Abs: 0.5 10*3/uL — ABNORMAL LOW (ref 0.7–4.0)
MCH: 32.2 pg (ref 26.0–34.0)
MCHC: 33 g/dL (ref 30.0–36.0)
MCV: 97.4 fL (ref 80.0–100.0)
Monocytes Absolute: 0.7 10*3/uL (ref 0.1–1.0)
Monocytes Relative: 20 %
Neutro Abs: 1.9 10*3/uL (ref 1.7–7.7)
Neutrophils Relative %: 56 %
Platelet Count: 264 10*3/uL (ref 150–400)
RBC: 3.51 MIL/uL — ABNORMAL LOW (ref 3.87–5.11)
RDW: 15.7 % — ABNORMAL HIGH (ref 11.5–15.5)
WBC Count: 3.4 10*3/uL — ABNORMAL LOW (ref 4.0–10.5)
nRBC: 0 % (ref 0.0–0.2)

## 2023-04-20 LAB — CMP (CANCER CENTER ONLY)
ALT: 12 U/L (ref 0–44)
AST: 12 U/L — ABNORMAL LOW (ref 15–41)
Albumin: 3.7 g/dL (ref 3.5–5.0)
Alkaline Phosphatase: 34 U/L — ABNORMAL LOW (ref 38–126)
Anion gap: 5 (ref 5–15)
BUN: 17 mg/dL (ref 8–23)
CO2: 26 mmol/L (ref 22–32)
Calcium: 8.8 mg/dL — ABNORMAL LOW (ref 8.9–10.3)
Chloride: 106 mmol/L (ref 98–111)
Creatinine: 0.69 mg/dL (ref 0.44–1.00)
GFR, Estimated: >60 mL/min (ref >60)
Glucose, Bld: 121 mg/dL — ABNORMAL HIGH (ref 70–99)
Potassium: 4.2 mmol/L (ref 3.5–5.1)
Sodium: 137 mmol/L (ref 135–145)
Total Bilirubin: 0.3 mg/dL (ref 0.3–1.2)
Total Protein: 7.2 g/dL (ref 6.5–8.1)

## 2023-04-20 MED ORDER — HYDROXYZINE HCL 25 MG PO TABS
25.0000 mg | ORAL_TABLET | Freq: Once | ORAL | Status: AC
  Administered 2023-04-20: 25 mg via ORAL
  Filled 2023-04-20: qty 1

## 2023-04-20 MED ORDER — ZOLEDRONIC ACID 4 MG/100ML IV SOLN
4.0000 mg | Freq: Once | INTRAVENOUS | Status: AC
  Administered 2023-04-20: 4 mg via INTRAVENOUS
  Filled 2023-04-20: qty 100

## 2023-04-20 MED ORDER — FAMOTIDINE IN NACL 20-0.9 MG/50ML-% IV SOLN
20.0000 mg | Freq: Once | INTRAVENOUS | Status: AC
  Administered 2023-04-20: 20 mg via INTRAVENOUS
  Filled 2023-04-20: qty 50

## 2023-04-20 MED ORDER — HEPARIN SOD (PORK) LOCK FLUSH 100 UNIT/ML IV SOLN
500.0000 [IU] | Freq: Once | INTRAVENOUS | Status: AC | PRN
  Administered 2023-04-20: 500 [IU]

## 2023-04-20 MED ORDER — POMALIDOMIDE 3 MG PO CAPS: 3.0000 mg | ORAL_CAPSULE | Freq: Every day | ORAL | 1 refills | Status: DC

## 2023-04-20 MED ORDER — SODIUM CHLORIDE 0.9 % IV SOLN: Freq: Once | INTRAVENOUS | Status: AC

## 2023-04-20 MED ORDER — SODIUM CHLORIDE 0.9% FLUSH
10.0000 mL | Freq: Once | INTRAVENOUS | Status: AC
  Administered 2023-04-20: 10 mL

## 2023-04-20 MED ORDER — SODIUM CHLORIDE 0.9 % IV SOLN
16.0000 mg/kg | Freq: Once | INTRAVENOUS | Status: AC
  Administered 2023-04-20: 800 mg via INTRAVENOUS
  Filled 2023-04-20: qty 40

## 2023-04-20 MED ORDER — SODIUM CHLORIDE 0.9% FLUSH
10.0000 mL | INTRAVENOUS | Status: DC | PRN
  Administered 2023-04-20: 10 mL

## 2023-04-20 MED ORDER — ACETAMINOPHEN 325 MG PO TABS
650.0000 mg | ORAL_TABLET | Freq: Once | ORAL | Status: AC
  Administered 2023-04-20: 650 mg via ORAL
  Filled 2023-04-20: qty 2

## 2023-04-20 NOTE — Patient Instructions (Signed)
[facility]  Discharge Instructions: Thank you for choosing [facility] to provide your oncology and hematology care.   If you have a lab appointment with the Cancer Center, please go directly to the Cancer Center and check in at the registration area.   Wear comfortable clothing and clothing appropriate for easy access to any Portacath or PICC line.   We strive to give you quality time with your provider. You may need to reschedule your appointment if you arrive late (15 or more minutes).  Arriving late affects you and other patients whose appointments are after yours.  Also, if you miss three or more appointments without notifying the office, you may be dismissed from the clinic at the provider's discretion.      For prescription refill requests, have your pharmacy contact our office and allow 72 hours for refills to be completed.    Today you received the following chemotherapy and/or immunotherapy agents: Darzalex    To help prevent nausea and vomiting after your treatment, we encourage you to take your nausea medication as directed.  BELOW ARE SYMPTOMS THAT SHOULD BE REPORTED IMMEDIATELY: *FEVER GREATER THAN 100.4 F (38 C) OR HIGHER *CHILLS OR SWEATING *NAUSEA AND VOMITING THAT IS NOT CONTROLLED WITH YOUR NAUSEA MEDICATION *UNUSUAL SHORTNESS OF BREATH *UNUSUAL BRUISING OR BLEEDING *URINARY PROBLEMS (pain or burning when urinating, or frequent urination) *BOWEL PROBLEMS (unusual diarrhea, constipation, pain near the anus) TENDERNESS IN MOUTH AND THROAT WITH OR WITHOUT PRESENCE OF ULCERS (sore throat, sores in mouth, or a toothache) UNUSUAL RASH, SWELLING OR PAIN  UNUSUAL VAGINAL DISCHARGE OR ITCHING   Items with * indicate a potential emergency and should be followed up as soon as possible or go to the Emergency Department if any problems should occur.  Please show the CHEMOTHERAPY ALERT CARD or IMMUNOTHERAPY ALERT CARD at check-in  to the Emergency Department and triage nurse.  Should you have questions after your visit or need to cancel or reschedule your appointment, please contact [facility]  Dept: [phone]  and follow the prompts.  Office hours are 8:00 a.m. to 4:30 p.m. Monday - Friday. Please note that voicemails left after 4:00 p.m. may not be returned until the following business day.  We are closed weekends and major holidays. You have access to a nurse at all times for urgent questions. Please call the main number to the clinic Dept: [phone] and follow the prompts.   For any non-urgent questions, you may also contact your provider using MyChart. We now offer e-Visits for anyone 18 and older to request care online for non-urgent symptoms. For details visit [email or web address].   Also download the MyChart app! Go to the app store, search "MyChart", open the app, select [facility], and log in with your MyChart username and password.   

## 2023-04-20 NOTE — Progress Notes (Signed)
Patient seen by Dr. Kale  Vitals are within treatment parameters.  Labs reviewed: and are within treatment parameters.  Per physician team, patient is ready for treatment and there are NO modifications to the treatment plan.  

## 2023-04-20 NOTE — Progress Notes (Signed)
[facility]   Telephone:([phone]) [phone] [phone]   Clinic Follow up Note   Patient Care Team: Shelva Majestic, MD as PCP - General (Family Medicine) Johney Maine, MD as Consulting Physician (Hematology) Bond, Doran Stabler, MD as Referring Physician (Ophthalmology) Zigmund Daniel, Maureen Ralphs, MD as Consulting Physician (Hematology and Oncology) Carlus Pavlov, MD as Consulting Physician (Internal Medicine) Darnell Level, MD as Consulting Physician (General Surgery) Erroll Luna, [facility] (Inactive) as Pharmacist (Pharmacist)  Date of Service: 04/20/2023  CHIEF COMPLAINT:  Follow-up for continuation and management of multiple myeloma   SUMMARY OF ONCOLOGIC HISTORY: Oncology History  Multiple myeloma not having achieved remission (HCC)  12/12/2018 Initial Diagnosis   Multiple myeloma not having achieved remission (HCC)   12/20/2018 - 10/04/2019 Chemotherapy   The patient had dexamethasone (DECADRON) tablet 20 mg, 20 mg (100 % of original dose 20 mg), Oral, Once, 14 of 14 cycles Dose modification: 20 mg (original dose 20 mg, Cycle 1), 10 mg (original dose 20 mg, Cycle 14) Administration: 20 mg (12/20/2018), 20 mg (12/27/2018), 20 mg (01/10/2019), 20 mg (01/17/2019), 20 mg (01/31/2019), 20 mg (02/07/2019), 20 mg (03/14/2019), 20 mg (03/21/2019), 20 mg (02/21/2019), 20 mg (02/28/2019), 20 mg (04/04/2019), 20 mg (04/11/2019), 20 mg (04/25/2019), 20 mg (05/02/2019), 20 mg (05/16/2019), 20 mg (05/23/2019), 20 mg (06/06/2019), 20 mg (06/13/2019), 20 mg (06/27/2019), 20 mg (07/04/2019), 20 mg (07/18/2019), 20 mg (07/25/2019), 20 mg (08/08/2019), 20 mg (08/15/2019), 20 mg (08/29/2019), 20 mg (09/05/2019), 10 mg (09/19/2019) lenalidomide (REVLIMID) 15 MG capsule, 1 of 1 cycle, Start date: 01/18/2019, End date: 02/21/2019 bortezomib SQ (VELCADE) chemo injection 2 mg, 1.3 mg/m2 = 2 mg, Subcutaneous,  Once, 14 of 14 cycles Administration: 2 mg (12/20/2018), 2 mg (12/27/2018), 2 mg (12/23/2018), 2 mg  (12/30/2018), 2 mg (01/10/2019), 2 mg (01/17/2019), 2 mg (01/13/2019), 2 mg (01/20/2019), 2 mg (01/31/2019), 2 mg (02/07/2019), 2 mg (02/03/2019), 2 mg (02/10/2019), 2 mg (03/14/2019), 2 mg (03/17/2019), 2 mg (03/21/2019), 2 mg (03/24/2019), 2 mg (02/21/2019), 2 mg (02/24/2019), 2 mg (02/28/2019), 2 mg (03/03/2019), 2 mg (04/04/2019), 2 mg (04/06/2019), 2 mg (04/11/2019), 2 mg (04/14/2019), 2 mg (04/25/2019), 2 mg (04/28/2019), 2 mg (05/02/2019), 2 mg (05/05/2019), 2 mg (05/16/2019), 2 mg (05/19/2019), 2 mg (05/23/2019), 2 mg (05/26/2019), 2 mg (06/06/2019), 2 mg (06/09/2019), 2 mg (06/13/2019), 2 mg (06/16/2019), 2 mg (06/27/2019), 2 mg (06/30/2019), 2 mg (07/04/2019), 2 mg (07/07/2019), 2 mg (07/18/2019), 2 mg (07/21/2019), 2 mg (07/25/2019), 2 mg (07/28/2019), 2 mg (08/08/2019), 2 mg (08/11/2019), 2 mg (08/15/2019), 2 mg (08/18/2019), 2 mg (08/29/2019), 2 mg (09/01/2019), 2 mg (09/05/2019), 2 mg (09/08/2019), 2 mg (09/19/2019), 2 mg (10/04/2019)  for chemotherapy treatment.    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 female returns for continued evaluation and management of multiple myeloma. 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   Patient was last seen by me on  03/16/2023 and complained of nasal congestion, sneezing, sore throat, mild cough, mild jaw pain, and mildly increased upper back skin lesion.   Today, she reports that she has been feeling well overall since her new visit. Her respiratory infection has resolved  and she is planning on receiving a COVID-19 booster vaccination soon.   She reports normal p.o. intake, though she notes a loss of a couple of pounds. Patient denies any new bone pains, fever, chills, change in bowel habits, or abdominal pain. She reports occasional tenderness in her left hip on palpation. Patient reports toe fungus in left foot.  Patient will be seen by dermatology during the end of August for removal of a skin lesion in her upper back.  She was seen by a dentist recently and was found to have trigeminal neuralgia. Patient denies any pain unless she presses on the area. She also reports numbness in her chin. Patient will be seen by a dentist again in October.  MEDICAL HISTORY:  Past Medical History:  Diagnosis Date   Anemia    Glaucoma    HYPERTENSION 03/11/2007   Multiple myeloma (HCC)    OSTEOPENIA 03/11/2007   Pre-diabetes    Thyroid cancer (HCC)    Thyroid disease    Tubular adenoma of colon 07/2015    SURGICAL HISTORY: Past Surgical History:  Procedure Laterality Date   BONE MARROW BIOPSY     multiple   BREAST EXCISIONAL BIOPSY Right 2000   BREAST LUMPECTOMY  1990   benign   CATARACT EXTRACTION Bilateral 2018   DILATION AND CURETTAGE OF UTERUS     bleeding at menopause. No uterine cancer   IR IMAGING GUIDED PORT INSERTION  05/16/2021   IR RADIOLOGIST EVAL & MGMT  12/13/2018   THYROIDECTOMY N/A 08/02/2020   Procedure: TOTAL THYROIDECTOMY;  Surgeon: Darnell Level, MD;  Location: [facility];  Service: General;  Laterality: N/A;   TONSILLECTOMY     age 3   I have reviewed the social history and family history with the patient and they are unchanged from previous note.  ALLERGIES:  is allergic to ace inhibitors, benadryl [diphenhydramine], diamox [acetazolamide], sulfamethoxazole, lenalidomide, and penicillins.  MEDICATIONS:  Current Outpatient Medications  Medication Sig Dispense Refill   acyclovir (ZOVIRAX) 400 MG tablet Take 1 tablet (400 mg total) by mouth 2  (two) times daily. 60 tablet 3   ALPHAGAN P 0.1 % SOLN Place 1 drop into both eyes in the morning, at noon, and at bedtime.      amLODipine (NORVASC) 10 MG tablet Take 1 tablet (10 mg total) by mouth daily. 90 tablet 3   aspirin EC 81 MG tablet Take 81 mg by mouth daily.     Cholecalciferol (VITAMIN D3) 125 MCG (5000 UT) TABS Take 5,000 Units by mouth daily.     Co-Enzyme Q-10 100 MG CAPS Take 100 mg by mouth daily.     dexamethasone (DECADRON) 4 MG tablet Take 4 tablets (16 mg total) by mouth as directed. Take one hour prior to monthly Darzalex infusion. 12 tablet 3   dorzolamide-timolol (COSOPT) 22.3-6.8 MG/ML ophthalmic solution Place 1 drop into both eyes 2 (two) times daily.   11   EQ STOOL SOFTENER/LAXATIVE 8.6-50 MG tablet TAKE 2 TABLETS BY MOUTH AT BEDTIME 60 tablet 0   fentaNYL (DURAGESIC) 12 MCG/HR Place 1 patch onto the skin every 3 (three) days. 10 patch 0   levothyroxine (SYNTHROID) 88 MCG tablet Take 1 tablet (88 mcg total) by mouth daily before breakfast.  90 tablet 3   lidocaine-prilocaine (EMLA) cream Apply to affected area once 30 g 3   magnesium chloride (SLOW-MAG) 64 MG TBEC SR tablet Take by mouth.     Multiple Vitamins-Minerals (MULTIVITAMIN WITH MINERALS) tablet Take 1 tablet by mouth daily. Centrum Silver 50 +     Netarsudil-Latanoprost (ROCKLATAN) 0.02-0.005 % SOLN Place 1 drop into both eyes daily.     Omega-3 1000 MG CAPS Take 1,000 mg by mouth daily.      ondansetron (ZOFRAN) 8 MG tablet Take 1 tablet (8 mg total) by mouth every 8 (eight) hours as needed for nausea or vomiting. 30 tablet 1   oxyCODONE-acetaminophen (PERCOCET) 5-325 MG tablet Take 1-2 tablets by mouth every 4 (four) hours as needed for moderate pain or severe pain. (Patient not taking: Reported on 03/04/2023) 90 tablet 0   polyethylene glycol (MIRALAX) packet Take 17 g by mouth daily. 30 each 1   pomalidomide (POMALYST) 2 MG capsule Take 1 capsule (2 mg total) by mouth daily. Take for 21 days on, 7 days  off, repeat every 28 days 21 capsule 0   prochlorperazine (COMPAZINE) 10 MG tablet Take 1 tablet (10 mg total) by mouth every 6 (six) hours as needed for nausea or vomiting. 30 tablet 1   Simethicone (GAS-X PO) Take 1 tablet by mouth as needed.     vitamin C (ASCORBIC ACID) 500 MG tablet Take 500 mg by mouth 2 (two) times a week.      No current facility-administered medications for this visit.    PHYSICAL EXAMINATION:. BP (!) 174/73   Pulse 66   Temp 97.7 F (36.5 C)   Resp 18   Wt 106 lb 12.8 oz (48.4 kg)   SpO2 99%   BMI 20.86 kg/m  GENERAL:alert, in no acute distress and comfortable SKIN: no acute rashes, no significant lesions EYES: conjunctiva are pink and non-injected, sclera anicteric OROPHARYNX: MMM, no exudates, no oropharyngeal erythema or ulceration NECK: supple, no JVD LYMPH:  no palpable lymphadenopathy in the cervical, axillary or inguinal regions LUNGS: clear to auscultation b/l with normal respiratory effort HEART: regular rate & rhythm ABDOMEN:  normoactive bowel sounds , non tender, not distended. Extremity: no pedal edema PSYCH: alert & oriented x 3 with fluent speech NEURO: no focal motor/sensory deficits    LABORATORY DATA:     Latest Ref Rng & Units 04/20/2023    9:33 AM 03/23/2023    1:03 PM 03/16/2023    9:58 AM  CBC  WBC 4.0 - 10.5 K/uL 3.4  4.4  2.6   Hemoglobin 12.0 - 15.0 g/dL 16.1  09.6  04.5   Hematocrit 36.0 - 46.0 % 34.2  32.7  33.9   Platelets 150 - 400 K/uL 264  201  246       Latest Ref Rng & Units 04/20/2023    9:33 AM 03/23/2023    1:03 PM 03/16/2023    9:58 AM  CMP  Glucose 70 - 99 mg/dL 409  811  914   BUN 8 - 23 mg/dL 17  16  16    Creatinine 0.44 - 1.00 mg/dL 7.82  9.56  2.13   Sodium 135 - 145 mmol/L 137  135  137   Potassium 3.5 - 5.1 mmol/L 4.2  4.3  4.3   Chloride 98 - 111 mmol/L 106  103  106   CO2 22 - 32 mmol/L 26  26  26    Calcium 8.9 - 10.3 mg/dL 8.8  8.8  8.8   Total Protein 6.5 - 8.1 g/dL 7.2  6.4  7.2   Total  Bilirubin 0.3 - 1.2 mg/dL 0.3  0.3  0.3   Alkaline Phos 38 - 126 U/L 34  33  32   AST 15 - 41 U/L 12  14  12    ALT 0 - 44 U/L 12  11  11     Component     Latest Ref Rng 12/22/2022  Alpha 1     0.0 - 0.4 g/dL 0.3 (C)  IgG (Immunoglobin G), Serum     586 - 1,602 mg/dL 093   IgM (Immunoglobulin M), Srm     26 - 217 mg/dL 10 (L)   Globulin, Total     2.2 - 3.9 g/dL 2.7 (C)  IgA     64 - 235 mg/dL 34 (L)   Total Protein ELP     6.0 - 8.5 g/dL 6.4 (C)  Albumin SerPl Elph-Mcnc     2.9 - 4.4 g/dL 3.7 (C)  Alpha2 Glob SerPl Elph-Mcnc     0.4 - 1.0 g/dL 0.7 (C)  B-Globulin SerPl Elph-Mcnc     0.7 - 1.3 g/dL 1.5 (H) (C)  Gamma Glob SerPl Elph-Mcnc     0.4 - 1.8 g/dL 0.2 (L) (C)  M Protein SerPl Elph-Mcnc     Not Observed g/dL 0.9 (H) (C)  Albumin/Glob SerPl     0.7 - 1.7  1.4 (C)  IFE 1 Comment ! (C)  Please Note (HCV): Comment (C)     RADIOGRAPHIC STUDIES: I have personally reviewed the radiological images as listed and agreed with the findings in the report. No results found.   ASSESSMENT & PLAN:   Megan Olson is a 82 y.o. female who presents for a follow up for multiple myeloma.   1. 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  She achieved complete response with negative M protein and negative PET scan in December 2020. -She subsequently went on Ninlaro maintenance therapy -PET scan from May 08, 2021, which showed focal activity within the right femur, consistent with active multiple myeloma, no other hypermetabolic lesion or plasmacytoma. -05/06/2021 bone marrow biopsy, which showed 3% plasma cells. -Recommend Zometa every 2 months.  2. Mild anemia -We will continue to monitor with myeloma treatment and current medication  3.  Cancer related pain  -Currently well controlled -Continue fentanyl patch  at 12 mcg/h and Percocet for break through pain.    PLAN:  -Discussed lab results on 04/20/2023 in detail with patient. CBC stable, showed WBC of 3.4K, hemoglobin of 11.3, and platelets of 264K. -CMP pending, previously stable -Myeloma panel from 03/16/2023 showed M protein continues to increase up to 1.2 -will increase Daratumumab to every 2 weeks instead of every 4 weeks -may increase Pomalidomide to 3 mg a day instead of 2 mg a day -will lower dose of pretreatment Dexamethasone to 8 MG -informed patient that increase in M protein may be from holding cycle 12 of treatment due to respiratory infection or related to Daratumumab -No major affects on blood counts or blood chemistries at this time -No new significant toxicities from a current plan of treatment with Dara/Pom/Dex at this time.  -recommend repeat PET scan and bone marrow biopsy for further evaluation for any visible signs of progress. -may consider Venetoclax if  there is a presence of t(11;14)  -will refer patient to [facility] or [facility] for consideration of 4th and 5th line treatment options such as Teclistamab -answered all of patient's questions in detail -advised patient to have further evaluation of trigeminal neuralgia to determine cause  FOLLOW-UP: Changing Daratumumab to every 2 weeks with portflush and labs-- plz schedule next 4 treatments CT bone marrow aspiration and Biopsy in 1-2 weeks PET/CT in 2 weeks MD visit in 4 weeks  The total time spent in the appointment was 32 minutes* .  All of the patient's questions were answered with apparent satisfaction. The patient knows to call the clinic with any problems, questions or concerns.   Wyvonnia Lora MD MS AAHIVMS [facility] Hematology/Oncology Physician [facility]  .*Total Encounter Time as defined by the Centers for Medicare and Medicaid Services includes, in addition to the face-to-face time of a patient visit (documented in the note above)  non-face-to-face time: obtaining and reviewing outside history, ordering and reviewing medications, tests or procedures, care coordination (communications with other health care professionals or caregivers) and documentation in the medical record.    I,Mitra Faeizi,acting as a Neurosurgeon for Wyvonnia Lora, MD.,have documented all relevant documentation on the behalf of Wyvonnia Lora, MD,as directed by  Wyvonnia Lora, MD while in the presence of Wyvonnia Lora, MD.  .I have reviewed the above documentation for accuracy and completeness, and I agree with the above. Johney Maine MD

## 2023-04-25 LAB — MULTIPLE MYELOMA PANEL, SERUM
Albumin SerPl Elph-Mcnc: 3.5 g/dL (ref 2.9–4.4)
Albumin/Glob SerPl: 1.1 (ref 0.7–1.7)
Alpha 1: 0.3 g/dL (ref 0.0–0.4)
Alpha2 Glob SerPl Elph-Mcnc: 0.7 g/dL (ref 0.4–1.0)
B-Globulin SerPl Elph-Mcnc: 2.3 g/dL — ABNORMAL HIGH (ref 0.7–1.3)
Gamma Glob SerPl Elph-Mcnc: 0.2 g/dL — ABNORMAL LOW (ref 0.4–1.8)
Globulin, Total: 3.4 g/dL (ref 2.2–3.9)
IgA: 25 mg/dL — ABNORMAL LOW (ref 64–422)
IgG (Immunoglobin G), Serum: 2046 mg/dL — ABNORMAL HIGH (ref 586–1602)
IgM (Immunoglobulin M), Srm: 11 mg/dL — ABNORMAL LOW (ref 26–217)
M Protein SerPl Elph-Mcnc: 1.7 g/dL — ABNORMAL HIGH
Total Protein ELP: 6.9 g/dL (ref 6.0–8.5)

## 2023-04-27 ENCOUNTER — Encounter: Payer: Self-pay | Admitting: Hematology

## 2023-04-27 ENCOUNTER — Other Ambulatory Visit: Payer: Self-pay | Admitting: Hematology and Oncology

## 2023-04-27 DIAGNOSIS — C9002 Multiple myeloma in relapse: Secondary | ICD-10-CM

## 2023-05-03 ENCOUNTER — Other Ambulatory Visit: Payer: Self-pay

## 2023-05-03 ENCOUNTER — Other Ambulatory Visit: Payer: Self-pay | Admitting: Hematology and Oncology

## 2023-05-03 ENCOUNTER — Encounter (HOSPITAL_COMMUNITY)
Admission: RE | Admit: 2023-05-03 | Discharge: 2023-05-03 | Disposition: A | Discharge status: Home / Self Care | Payer: Medicare HMO | Source: Ambulatory Visit | Attending: Hematology and Oncology | Admitting: Hematology and Oncology

## 2023-05-03 DIAGNOSIS — C9002 Multiple myeloma in relapse: Secondary | ICD-10-CM

## 2023-05-03 DIAGNOSIS — Z7189 Other specified counseling: Secondary | ICD-10-CM

## 2023-05-03 LAB — GLUCOSE, CAPILLARY: Glucose-Capillary: 106 mg/dL — ABNORMAL HIGH (ref 70–99)

## 2023-05-03 MED ORDER — FLUDEOXYGLUCOSE F - 18 (FDG) INJECTION
5.3000 | Freq: Once | INTRAVENOUS | Status: AC
  Administered 2023-05-03: 5.3 via INTRAVENOUS

## 2023-05-03 MED ORDER — POMALIDOMIDE 3 MG PO CAPS
3.0000 mg | ORAL_CAPSULE | Freq: Every day | ORAL | 1 refills | Status: DC
Start: 2023-05-03 — End: 2023-05-26

## 2023-05-04 ENCOUNTER — Other Ambulatory Visit: Payer: Self-pay | Admitting: Student

## 2023-05-04 ENCOUNTER — Other Ambulatory Visit: Payer: Self-pay

## 2023-05-04 ENCOUNTER — Inpatient Hospital Stay: Payer: Medicare HMO

## 2023-05-04 VITALS — BP 134/68 | HR 60 | Temp 98.4°F | Resp 15 | Wt 106.2 lb

## 2023-05-04 DIAGNOSIS — D649 Anemia, unspecified: Secondary | ICD-10-CM | POA: Diagnosis not present

## 2023-05-04 DIAGNOSIS — Z7189 Other specified counseling: Secondary | ICD-10-CM

## 2023-05-04 DIAGNOSIS — C9002 Multiple myeloma in relapse: Secondary | ICD-10-CM

## 2023-05-04 DIAGNOSIS — Z5112 Encounter for antineoplastic immunotherapy: Secondary | ICD-10-CM | POA: Diagnosis not present

## 2023-05-04 DIAGNOSIS — C9000 Multiple myeloma not having achieved remission: Secondary | ICD-10-CM

## 2023-05-04 DIAGNOSIS — Z95828 Presence of other vascular implants and grafts: Secondary | ICD-10-CM

## 2023-05-04 LAB — CBC WITH DIFFERENTIAL (CANCER CENTER ONLY)
Abs Immature Granulocytes: 0.02 10*3/uL (ref 0.00–0.07)
Basophils Absolute: 0.2 10*3/uL — ABNORMAL HIGH (ref 0.0–0.1)
Basophils Relative: 3 %
Eosinophils Absolute: 0.3 10*3/uL (ref 0.0–0.5)
Eosinophils Relative: 6 %
HCT: 33.1 % — ABNORMAL LOW (ref 36.0–46.0)
Hemoglobin: 11.1 g/dL — ABNORMAL LOW (ref 12.0–15.0)
Immature Granulocytes: 0 %
Lymphocytes Relative: 8 %
Lymphs Abs: 0.4 10*3/uL — ABNORMAL LOW (ref 0.7–4.0)
MCH: 32.3 pg (ref 26.0–34.0)
MCHC: 33.5 g/dL (ref 30.0–36.0)
MCV: 96.2 fL (ref 80.0–100.0)
Monocytes Absolute: 0.8 10*3/uL (ref 0.1–1.0)
Monocytes Relative: 16 %
Neutro Abs: 3.1 10*3/uL (ref 1.7–7.7)
Neutrophils Relative %: 67 %
Platelet Count: 213 10*3/uL (ref 150–400)
RBC: 3.44 MIL/uL — ABNORMAL LOW (ref 3.87–5.11)
RDW: 15.6 % — ABNORMAL HIGH (ref 11.5–15.5)
WBC Count: 4.7 10*3/uL (ref 4.0–10.5)
nRBC: 0 % (ref 0.0–0.2)

## 2023-05-04 LAB — CMP (CANCER CENTER ONLY)
ALT: 12 U/L (ref 0–44)
AST: 11 U/L — ABNORMAL LOW (ref 15–41)
Albumin: 3.6 g/dL (ref 3.5–5.0)
Alkaline Phosphatase: 32 U/L — ABNORMAL LOW (ref 38–126)
Anion gap: 5 (ref 5–15)
BUN: 14 mg/dL (ref 8–23)
CO2: 25 mmol/L (ref 22–32)
Calcium: 8.7 mg/dL — ABNORMAL LOW (ref 8.9–10.3)
Chloride: 107 mmol/L (ref 98–111)
Creatinine: 0.7 mg/dL (ref 0.44–1.00)
GFR, Estimated: >60 mL/min (ref >60)
Glucose, Bld: 119 mg/dL — ABNORMAL HIGH (ref 70–99)
Potassium: 4.1 mmol/L (ref 3.5–5.1)
Sodium: 137 mmol/L (ref 135–145)
Total Bilirubin: 0.3 mg/dL (ref 0.3–1.2)
Total Protein: 7 g/dL (ref 6.5–8.1)

## 2023-05-04 MED ORDER — SODIUM CHLORIDE 0.9 % IV SOLN
16.0000 mg/kg | Freq: Once | INTRAVENOUS | Status: AC
  Administered 2023-05-04: 800 mg via INTRAVENOUS
  Filled 2023-05-04: qty 40

## 2023-05-04 MED ORDER — HYDROXYZINE HCL 25 MG PO TABS
25.0000 mg | ORAL_TABLET | Freq: Once | ORAL | Status: AC
  Administered 2023-05-04: 25 mg via ORAL
  Filled 2023-05-04: qty 1

## 2023-05-04 MED ORDER — FAMOTIDINE IN NACL 20-0.9 MG/50ML-% IV SOLN
20.0000 mg | Freq: Once | INTRAVENOUS | Status: AC
  Administered 2023-05-04: 20 mg via INTRAVENOUS
  Filled 2023-05-04: qty 50

## 2023-05-04 MED ORDER — SODIUM CHLORIDE 0.9% FLUSH
10.0000 mL | Freq: Once | INTRAVENOUS | Status: AC
  Administered 2023-05-04: 10 mL

## 2023-05-04 MED ORDER — SODIUM CHLORIDE 0.9% FLUSH
10.0000 mL | INTRAVENOUS | Status: DC | PRN
  Administered 2023-05-04: 10 mL

## 2023-05-04 MED ORDER — HEPARIN SOD (PORK) LOCK FLUSH 100 UNIT/ML IV SOLN
500.0000 [IU] | Freq: Once | INTRAVENOUS | Status: AC | PRN
  Administered 2023-05-04: 500 [IU]

## 2023-05-04 MED ORDER — SODIUM CHLORIDE 0.9 % IV SOLN: Freq: Once | INTRAVENOUS | Status: AC

## 2023-05-04 MED ORDER — ACETAMINOPHEN 325 MG PO TABS
650.0000 mg | ORAL_TABLET | Freq: Once | ORAL | Status: AC
  Administered 2023-05-04: 650 mg via ORAL
  Filled 2023-05-04: qty 2

## 2023-05-04 NOTE — Progress Notes (Signed)
Patient states she took her dexamethasone at home today.   Daphane Shepherd, PA-C, assessed patient at chairside for new lump on right collarbone.  Per PA, OK to proceed with Tx today.  PA will communicate with MD regarding lump.

## 2023-05-04 NOTE — Progress Notes (Signed)
Pt. In for port flush/labs.  States she had a PET SCAN done on yesterday and noted after the SCAN a raised area appeared on her right collar bone.  Denies any pain or discomfort in the area.  Had Dacia/RN to assess the area.  Pt. States the port wasn't used on yesterday.  Anise Salvo states ok to go ahead and use the port, will have Kaitlyn/PA to assess the area while pt. Is in infusion.  Secure chatted message to [facility] Wright/RN

## 2023-05-04 NOTE — Patient Instructions (Signed)
[facility]  Discharge Instructions: Thank you for choosing [facility] to provide your oncology and hematology care.   If you have a lab appointment with the Cancer Center, please go directly to the Cancer Center and check in at the registration area.   Wear comfortable clothing and clothing appropriate for easy access to any Portacath or PICC line.   We strive to give you quality time with your provider. You may need to reschedule your appointment if you arrive late (15 or more minutes).  Arriving late affects you and other patients whose appointments are after yours.  Also, if you miss three or more appointments without notifying the office, you may be dismissed from the clinic at the provider's discretion.      For prescription refill requests, have your pharmacy contact our office and allow 72 hours for refills to be completed.    Today you received the following chemotherapy and/or immunotherapy agents: Darzalex    To help prevent nausea and vomiting after your treatment, we encourage you to take your nausea medication as directed.  BELOW ARE SYMPTOMS THAT SHOULD BE REPORTED IMMEDIATELY: *FEVER GREATER THAN 100.4 F (38 C) OR HIGHER *CHILLS OR SWEATING *NAUSEA AND VOMITING THAT IS NOT CONTROLLED WITH YOUR NAUSEA MEDICATION *UNUSUAL SHORTNESS OF BREATH *UNUSUAL BRUISING OR BLEEDING *URINARY PROBLEMS (pain or burning when urinating, or frequent urination) *BOWEL PROBLEMS (unusual diarrhea, constipation, pain near the anus) TENDERNESS IN MOUTH AND THROAT WITH OR WITHOUT PRESENCE OF ULCERS (sore throat, sores in mouth, or a toothache) UNUSUAL RASH, SWELLING OR PAIN  UNUSUAL VAGINAL DISCHARGE OR ITCHING   Items with * indicate a potential emergency and should be followed up as soon as possible or go to the Emergency Department if any problems should occur.  Please show the CHEMOTHERAPY ALERT CARD or IMMUNOTHERAPY ALERT CARD at check-in  to the Emergency Department and triage nurse.  Should you have questions after your visit or need to cancel or reschedule your appointment, please contact [facility]  Dept: [phone]  and follow the prompts.  Office hours are 8:00 a.m. to 4:30 p.m. Monday - Friday. Please note that voicemails left after 4:00 p.m. may not be returned until the following business day.  We are closed weekends and major holidays. You have access to a nurse at all times for urgent questions. Please call the main number to the clinic Dept: [phone] and follow the prompts.   For any non-urgent questions, you may also contact your provider using MyChart. We now offer e-Visits for anyone 18 and older to request care online for non-urgent symptoms. For details visit [email or web address].   Also download the MyChart app! Go to the app store, search "MyChart", open the app, select [facility], and log in with your MyChart username and password.   

## 2023-05-05 ENCOUNTER — Ambulatory Visit (HOSPITAL_COMMUNITY)
Admission: RE | Admit: 2023-05-05 | Discharge: 2023-05-05 | Disposition: A | Discharge status: Home / Self Care | Payer: Medicare HMO | Source: Ambulatory Visit | Attending: Hematology and Oncology | Admitting: Hematology and Oncology

## 2023-05-05 ENCOUNTER — Encounter (HOSPITAL_COMMUNITY): Payer: Self-pay

## 2023-05-05 ENCOUNTER — Other Ambulatory Visit: Payer: Self-pay

## 2023-05-05 ENCOUNTER — Other Ambulatory Visit: Payer: Self-pay | Admitting: Hematology and Oncology

## 2023-05-05 DIAGNOSIS — C9000 Multiple myeloma not having achieved remission: Secondary | ICD-10-CM | POA: Diagnosis not present

## 2023-05-05 DIAGNOSIS — C9002 Multiple myeloma in relapse: Secondary | ICD-10-CM | POA: Insufficient documentation

## 2023-05-05 DIAGNOSIS — H409 Unspecified glaucoma: Secondary | ICD-10-CM | POA: Diagnosis not present

## 2023-05-05 DIAGNOSIS — D649 Anemia, unspecified: Secondary | ICD-10-CM | POA: Diagnosis not present

## 2023-05-05 DIAGNOSIS — Z5112 Encounter for antineoplastic immunotherapy: Secondary | ICD-10-CM | POA: Diagnosis not present

## 2023-05-05 LAB — CBC WITH DIFFERENTIAL/PLATELET
Abs Immature Granulocytes: 0.03 10*3/uL (ref 0.00–0.07)
Basophils Absolute: 0.1 10*3/uL (ref 0.0–0.1)
Basophils Relative: 2 %
Eosinophils Absolute: 0.1 10*3/uL (ref 0.0–0.5)
Eosinophils Relative: 2 %
HCT: 34.7 % — ABNORMAL LOW (ref 36.0–46.0)
Hemoglobin: 11.2 g/dL — ABNORMAL LOW (ref 12.0–15.0)
Immature Granulocytes: 0 %
Lymphocytes Relative: 9 %
Lymphs Abs: 0.6 10*3/uL — ABNORMAL LOW (ref 0.7–4.0)
MCH: 32.2 pg (ref 26.0–34.0)
MCHC: 32.3 g/dL (ref 30.0–36.0)
MCV: 99.7 fL (ref 80.0–100.0)
Monocytes Absolute: 1.2 10*3/uL — ABNORMAL HIGH (ref 0.1–1.0)
Monocytes Relative: 17 %
Neutro Abs: 4.8 10*3/uL (ref 1.7–7.7)
Neutrophils Relative %: 70 %
Platelets: 220 10*3/uL (ref 150–400)
RBC: 3.48 MIL/uL — ABNORMAL LOW (ref 3.87–5.11)
RDW: 16 % — ABNORMAL HIGH (ref 11.5–15.5)
WBC: 6.9 10*3/uL (ref 4.0–10.5)
nRBC: 0 % (ref 0.0–0.2)

## 2023-05-05 MED ORDER — LIDOCAINE HCL (PF) 1 % IJ SOLN
INTRAMUSCULAR | Status: AC | PRN
  Administered 2023-05-05: 5 mL via INTRADERMAL

## 2023-05-05 MED ORDER — FLUMAZENIL 0.5 MG/5ML IV SOLN
INTRAVENOUS | Status: AC
  Filled 2023-05-05: qty 5

## 2023-05-05 MED ORDER — SODIUM CHLORIDE 0.9 % IV SOLN: INTRAVENOUS | Status: DC

## 2023-05-05 MED ORDER — FENTANYL CITRATE (PF) 100 MCG/2ML IJ SOLN
INTRAMUSCULAR | Status: AC | PRN
Start: 2023-05-05 — End: 2023-05-05
  Administered 2023-05-05: 25 ug via INTRAVENOUS

## 2023-05-05 MED ORDER — NALOXONE HCL 0.4 MG/ML IJ SOLN
INTRAMUSCULAR | Status: AC
  Filled 2023-05-05: qty 1

## 2023-05-05 MED ORDER — MIDAZOLAM HCL 2 MG/2ML IJ SOLN
INTRAMUSCULAR | Status: AC | PRN
Start: 2023-05-05 — End: 2023-05-05
  Administered 2023-05-05: .5 mg via INTRAVENOUS

## 2023-05-05 MED ORDER — MIDAZOLAM HCL 2 MG/2ML IJ SOLN
INTRAMUSCULAR | Status: AC
  Filled 2023-05-05: qty 2

## 2023-05-05 MED ORDER — MIDAZOLAM HCL 2 MG/2ML IJ SOLN
INTRAMUSCULAR | Status: AC | PRN
  Administered 2023-05-05: .5 mg via INTRAVENOUS

## 2023-05-05 MED ORDER — FENTANYL CITRATE (PF) 100 MCG/2ML IJ SOLN
INTRAMUSCULAR | Status: AC
  Filled 2023-05-05: qty 2

## 2023-05-05 NOTE — Procedures (Signed)
Interventional Radiology Procedure Note  Indication: Multiple Myeloma  Procedure: CT guided aspirate and core biopsy of right iliac bone  Complications: None  Bleeding: Minimal  Miachel Roux, MD [phone]

## 2023-05-05 NOTE — H&P (Signed)
Chief Complaint: Patient was seen in consultation today for multiple myeloma.  Referring Physician(s): Dorsey,John T IV  Supervising Physician: Mir, Secondary school teacher  Patient Status: [facility] - Out-pt  History of Present Illness: Megan Olson is a 82 y.o. female with a past medical history significant for glaucoma, anemia and multiple myeloma who presents today for a bone marrow aspiration/biopsy. Megan Olson was first diagnosed with multiple myeloma in 2020 after undergoing a bone marrow biopsy in IR (Dr. Grace Isaac). She has been followed regularly by oncology with a relapse in 2022. She was seen by oncology 04/20/23 where she was noted to have a further increase in her M protein and it was recommended she undergo PET scan and bone marrow biopsy to assess treatment progress. She underwent PET scan yesterday with results pending and presents to IR today for bone marrow biopsy.  Megan Olson denies complaints overall, she has a raised area on her right collarbone next to her port catheter that appeared after her PET scan yesterday. Her port was not used for the PET scan yesterday. The area was assessed by oncology PA who noted no acute concerns. The area is not tender or red and she feels well otherwise.   Past Medical History:  Diagnosis Date   Anemia    Glaucoma    HYPERTENSION 03/11/2007   Multiple myeloma (HCC)    OSTEOPENIA 03/11/2007   Pre-diabetes    Thyroid cancer (HCC)    Thyroid disease    Tubular adenoma of colon 07/2015    Past Surgical History:  Procedure Laterality Date   BONE MARROW BIOPSY     multiple   BREAST EXCISIONAL BIOPSY Right 2000   BREAST LUMPECTOMY  1990   benign   CATARACT EXTRACTION Bilateral 2018   DILATION AND CURETTAGE OF UTERUS     bleeding at menopause. No uterine cancer   IR IMAGING GUIDED PORT INSERTION  05/16/2021   IR RADIOLOGIST EVAL & MGMT  12/13/2018   THYROIDECTOMY N/A 08/02/2020   Procedure: TOTAL THYROIDECTOMY;  Surgeon: Darnell Level, MD;  Location: [facility];   Service: General;  Laterality: N/A;   TONSILLECTOMY     age 42    Allergies: Ace inhibitors, Benadryl [diphenhydramine], Diamox [acetazolamide], Sulfamethoxazole, Lenalidomide, and Penicillins  Medications: Prior to Admission medications   Medication Sig Start Date End Date Taking? Authorizing Provider  acyclovir (ZOVIRAX) 400 MG tablet Take 1 tablet (400 mg total) by mouth 2 (two) times daily. 03/26/23  Yes Kale, Corene Cornea, MD  ALPHAGAN P 0.1 % SOLN Place 1 drop into both eyes in the morning, at noon, and at bedtime.  02/02/17  Yes [provider]  amLODipine (NORVASC) 10 MG tablet Take 1 tablet (10 mg total) by mouth daily. 10/06/22  Yes Shelva Majestic, MD  aspirin EC 81 MG tablet Take 81 mg by mouth daily.   Yes [provider]  Cholecalciferol (VITAMIN D3) 125 MCG (5000 UT) TABS Take 5,000 Units by mouth daily.   Yes [provider]  Co-Enzyme Q-10 100 MG CAPS Take 100 mg by mouth daily.   Yes [provider]  dexamethasone (DECADRON) 4 MG tablet Take 4 tablets (16 mg total) by mouth as directed. Take one hour prior to monthly Darzalex infusion. 11/04/22  Yes Johney Maine, MD  dorzolamide-timolol (COSOPT) 22.3-6.8 MG/ML ophthalmic solution Place 1 drop into both eyes 2 (two) times daily.  03/01/17  Yes [provider]  EQ STOOL SOFTENER/LAXATIVE 8.6-50 MG tablet TAKE 2 TABLETS BY MOUTH  AT BEDTIME 03/05/23  Yes Johney Maine, MD  levothyroxine (SYNTHROID) 88 MCG tablet Take 1 tablet (88 mcg total) by mouth daily before breakfast. 01/28/23  Yes Carlus Pavlov, MD  lidocaine-prilocaine (EMLA) cream Apply to affected area once 04/15/23  Yes Johney Maine, MD  magnesium chloride (SLOW-MAG) 64 MG TBEC SR tablet Take by mouth.   Yes [provider]  Multiple Vitamins-Minerals (MULTIVITAMIN WITH MINERALS) tablet Take 1 tablet by mouth daily. Centrum Silver 50 +   Yes [provider]  Netarsudil-Latanoprost  (ROCKLATAN) 0.02-0.005 % SOLN Place 1 drop into both eyes daily. 03/23/22  Yes [provider]  Omega-3 1000 MG CAPS Take 1,000 mg by mouth daily.    Yes [provider]  oxyCODONE-acetaminophen (PERCOCET) 5-325 MG tablet Take 1-2 tablets by mouth every 4 (four) hours as needed for moderate pain or severe pain. 10/19/22  Yes Johney Maine, MD  polyethylene glycol [facility]) packet Take 17 g by mouth daily. 12/12/18  Yes Johney Maine, MD  pomalidomide (POMALYST) 3 MG capsule Take 1 capsule (3 mg total) by mouth daily. Take for 21 days on, 7 days off, repeat every 28 days 05/03/23  Yes Johney Maine, MD  vitamin C (ASCORBIC ACID) 500 MG tablet Take 500 mg by mouth 2 (two) times a week.  02/02/19  Yes [provider]  fentaNYL (DURAGESIC) 12 MCG/HR Place 1 patch onto the skin every 3 (three) days. 04/16/23   Johney Maine, MD  ondansetron (ZOFRAN) 8 MG tablet Take 1 tablet (8 mg total) by mouth every 8 (eight) hours as needed for nausea or vomiting. 05/06/22   Johney Maine, MD  prochlorperazine (COMPAZINE) 10 MG tablet Take 1 tablet (10 mg total) by mouth every 6 (six) hours as needed for nausea or vomiting. 05/06/22   Johney Maine, MD  Simethicone (GAS-X PO) Take 1 tablet by mouth as needed.    [provider]     Family History  Problem Relation Age of Onset   Heart disease Mother        CHF mother died 101   Arthritis Mother    Glaucoma Mother        sister as well   Alcohol abuse Father    Suicidality Father    Heart disease Sister        aortic valve replacement   Lung cancer Sister        smoker   Hyperlipidemia Brother    Hypertension Brother    COPD Brother    Colon polyps Brother    Arthritis Sister    Hypertension Sister    Glaucoma Sister    Hashimoto's thyroiditis Sister    Colon polyps Sister    Hypertension Son    Stroke Maternal Grandmother    Cystic fibrosis Niece    Colon cancer Neg Hx     Social  History   Socioeconomic History   Marital status: Married    Spouse name: Not on file   Number of children: 1   Years of education: Not on file   Highest education level: Not on file  Occupational History   Occupation: Retired  Tobacco Use   Smoking status: Former    Current packs/day: 0.00    Average packs/day: 0.5 packs/day for 7.0 years (3.5 ttl pk-yrs)    Types: Cigarettes    Start date: 01/04/1954    Quit date: 01/04/1961    Years since quitting: 62.3   Smokeless tobacco:  Never  Vaping Use   Vaping status: Never Used  Substance and Sexual Activity   Alcohol use: Not Currently    Alcohol/week: 1.0 standard drink of alcohol    Types: 1 Standard drinks or equivalent per week    Comment: occas    Drug use: No   Sexual activity: Not Currently  Other Topics Concern   Not on file  Social History Narrative   Married. Lives with husband (patient of Dr. Durene Cal). 1 son. No grandkids. 1 granddog.       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, swing dancing, dinner, read      Social Determinants of Health   Financial Resource Strain: Low Risk  (02/15/2023)   Overall Financial Resource Strain (CARDIA)    Difficulty of Paying Living Expenses: Not hard at all  Food Insecurity: No Food Insecurity (02/15/2023)   Hunger Vital Sign    Worried About Running Out of Food in the Last Year: Never true    Ran Out of Food in the Last Year: Never true  Transportation Needs: No Transportation Needs (02/15/2023)   PRAPARE - Administrator, Civil Service (Medical): No    Lack of Transportation (Non-Medical): No  Physical Activity: Inactive (02/15/2023)   Exercise Vital Sign    Days of Exercise per Week: 0 days    Minutes of Exercise per Session: 0 min  Stress: No Stress Concern Present (02/15/2023)   [employer] of Occupational Health - Occupational Stress Questionnaire    Feeling of Stress :  Not at all  Social Connections: Moderately Integrated (02/15/2023)   Social Connection and Isolation Panel [NHANES]    Frequency of Communication with Friends and Family: More than three times a week    Frequency of Social Gatherings with Friends and Family: More than three times a week    Attends Religious Services: More than 4 times per year    Active Member of [employer] or Organizations: No    Attends Banker Meetings: Never    Marital Status: Married     Review of Systems: A 12 point ROS discussed and pertinent positives are indicated in the HPI above.  All other systems are negative.  Review of Systems  Constitutional:  Negative for chills and fever.  Respiratory:  Negative for cough and shortness of breath.   Cardiovascular:  Negative for chest pain.  Gastrointestinal:  Negative for abdominal pain, diarrhea, nausea and vomiting.  Musculoskeletal:  Negative for back pain.  Neurological:  Negative for dizziness and headaches.    Vital Signs: Ht 5' (1.524 m)   Wt 106 lb 6.4 oz (48.3 kg)   BMI 20.78 kg/m   Physical Exam Vitals reviewed.  Constitutional:      General: She is not in acute distress. HENT:     Head: Normocephalic.     Mouth/Throat:     Mouth: Mucous membranes are moist.     Pharynx: Oropharynx is clear. No oropharyngeal exudate or posterior oropharyngeal erythema.  Cardiovascular:     Rate and Rhythm: Normal rate and regular rhythm.  Pulmonary:     Effort: Pulmonary effort is normal.     Breath sounds: Normal breath sounds.  Abdominal:     General: There is no distension.     Palpations: Abdomen is soft.     Tenderness: There is no abdominal tenderness.  Musculoskeletal:     Comments: (+)  approximately 4 inch x 2 inch area of firmness over the right collarbone just lateral to the port catheter. Non tender, no open wounds, no erythema or edema.   Skin:    General: Skin is warm and dry.  Neurological:     Mental Status: She is alert and  oriented to person, place, and time.  Psychiatric:        Mood and Affect: Mood normal.        Behavior: Behavior normal.        Thought Content: Thought content normal.        Judgment: Judgment normal.      MD Evaluation Airway: WNL Heart: WNL Abdomen: WNL Chest/ Lungs: WNL ASA  Classification: 3 Mallampati/Airway Score: Two   Imaging: No results found.  Labs:  CBC: Recent Labs    03/23/23 1303 04/20/23 0933 05/04/23 0805 05/05/23 0920  WBC 4.4 3.4* 4.7 6.9  HGB 11.2* 11.3* 11.1* 11.2*  HCT 32.7* 34.2* 33.1* 34.7*  PLT 201 264 213 220    COAGS: No results for input(s): "INR", "APTT" in the last 8760 hours.  BMP: Recent Labs    03/16/23 0958 03/23/23 1303 04/20/23 0933 05/04/23 0805  NA 137 135 137 137  K 4.3 4.3 4.2 4.1  CL 106 103 106 107  CO2 26 26 26 25   GLUCOSE 106* 167* 121* 119*  BUN 16 16 17 14   CALCIUM 8.8* 8.8* 8.8* 8.7*  CREATININE 0.67 0.61 0.69 0.70  GFRNONAA >60 >60 >60 >60    LIVER FUNCTION TESTS: Recent Labs    03/16/23 0958 03/23/23 1303 04/20/23 0933 05/04/23 0805  BILITOT 0.3 0.3 0.3 0.3  AST 12* 14* 12* 11*  ALT 11 11 12 12   ALKPHOS 32* 33* 34* 32*  PROT 7.2 6.4* 7.2 7.0  ALBUMIN 4.0 3.5 3.7 3.6    TUMOR MARKERS: No results for input(s): "AFPTM", "CEA", "CA199", "CHROMGRNA" in the last 8760 hours.  Assessment and Plan:  82 y/o F with history of multiple myeloma with previous relapse who presents today for repeat bone marrow aspiration/biopsy to assess for treatment response/possible relapse.   Risks and benefits of bone marrow aspiration/biopsy was discussed with the patient and/or patient's family including, but not limited to bleeding, infection, damage to adjacent structures or low yield requiring additional tests.  All of the questions were answered and there is agreement to proceed.  Consent signed and in chart.  Thank you for this interesting consult.  I greatly enjoyed meeting CODEE MARTURANO and look  forward to participating in their care.  A copy of this report was sent to the requesting provider on this date.  Electronically Signed: Villa Herb, PA-C 05/05/2023, 9:46 AM   I spent a total of 30 Minutes  in face to face in clinical consultation, greater than 50% of which was counseling/coordinating care for multiple myeloma.

## 2023-05-05 NOTE — Discharge Instructions (Signed)
Please call Interventional Radiology clinic [phone] with any questions or concerns. ? ?You may remove your dressing and shower tomorrow. ? ? ?Bone Marrow Aspiration and Bone Marrow Biopsy, Adult, Care After ?This sheet gives you information about how to care for yourself after your procedure. Your health care provider may also give you more specific instructions. If you have problems or questions, contact your health care provider. ?What can I expect after the procedure? ?After the procedure, it is common to have: ?Mild pain and tenderness. ?Swelling. ?Bruising. ?Follow these instructions at home: ?Puncture site care ?Follow instructions from your health care provider about how to take care of the puncture site. Make sure you: ?Wash your hands with soap and water before and after you change your bandage (dressing). If soap and water are not available, use hand sanitizer. ?Change your dressing as told by your health care provider. ?Check your puncture site every day for signs of infection. Check for: ?More redness, swelling, or pain. ?Fluid or blood. ?Warmth. ?Pus or a bad smell.   ?Activity ?Return to your normal activities as told by your health care provider. Ask your health care provider what activities are safe for you. ?Do not lift anything that is heavier than 10 lb (4.5 kg), or the limit that you are told, until your health care provider says that it is safe. ?Do not drive for 24 hours if you were given a sedative during your procedure. ?General instructions ?Take over-the-counter and prescription medicines only as told by your health care provider. ?Do not take baths, swim, or use a hot tub until your health care provider approves. Ask your health care provider if you may take showers. You may only be allowed to take sponge baths. ?If directed, put ice on the affected area. To do this: ?Put ice in a plastic bag. ?Place a towel between your skin and the bag. ?Leave the ice on for 20 minutes, 2-3 times a  day. ?Keep all follow-up visits as told by your health care provider. This is important.   ?Contact a health care provider if: ?Your pain is not controlled with medicine. ?You have a fever. ?You have more redness, swelling, or pain around the puncture site. ?You have fluid or blood coming from the puncture site. ?Your puncture site feels warm to the touch. ?You have pus or a bad smell coming from the puncture site. ?Summary ?After the procedure, it is common to have mild pain, tenderness, swelling, and bruising. ?Follow instructions from your health care provider about how to take care of the puncture site and what activities are safe for you. ?Take over-the-counter and prescription medicines only as told by your health care provider. ?Contact a health care provider if you have any signs of infection, such as fluid or blood coming from the puncture site. ?This information is not intended to replace advice given to you by your health care provider. Make sure you discuss any questions you have with your health care provider. ?Document Revised: 02/07/2019 Document Reviewed: 02/07/2019 ?Elsevier Patient Education ? 2021 Elsevier Inc. ? ? ?Moderate Conscious Sedation, Adult, Care After ?This sheet gives you information about how to care for yourself after your procedure. Your health care provider may also give you more specific instructions. If you have problems or questions, contact your health care provider. ?What can I expect after the procedure? ?After the procedure, it is common to have: ?Sleepiness for several hours. ?Impaired judgment for several hours. ?Difficulty with balance. ?Vomiting if you eat too   soon. ?Follow these instructions at home: ?For the time period you were told by your health care provider: ?Rest. ?Do not participate in activities where you could fall or become injured. ?Do not drive or use machinery. ?Do not drink alcohol. ?Do not take sleeping pills or medicines that cause drowsiness. ?Do not  make important decisions or sign legal documents. ?Do not take care of children on your own.  ?  ?  ?Eating and drinking ?Follow the diet recommended by your health care provider. ?Drink enough fluid to keep your urine pale yellow. ?If you vomit: ?Drink water, juice, or soup when you can drink without vomiting. ?Make sure you have little or no nausea before eating solid foods.   ?General instructions ?Take over-the-counter and prescription medicines only as told by your health care provider. ?Have a responsible adult stay with you for the time you are told. It is important to have someone help care for you until you are awake and alert. ?Do not smoke. ?Keep all follow-up visits as told by your health care provider. This is important. ?Contact a health care provider if: ?You are still sleepy or having trouble with balance after 24 hours. ?You feel light-headed. ?You keep feeling nauseous or you keep vomiting. ?You develop a rash. ?You have a fever. ?You have redness or swelling around the IV site. ?Get help right away if: ?You have trouble breathing. ?You have new-onset confusion at home. ?Summary ?After the procedure, it is common to feel sleepy, have impaired judgment, or feel nauseous if you eat too soon. ?Rest after you get home. Know the things you should not do after the procedure. ?Follow the diet recommended by your health care provider and drink enough fluid to keep your urine pale yellow. ?Get help right away if you have trouble breathing or new-onset confusion at home. ?This information is not intended to replace advice given to you by your health care provider. Make sure you discuss any questions you have with your health care provider. ?Document Revised: 01/19/2020 Document Reviewed: 08/17/2019 ?Elsevier Patient Education ? 2021 Elsevier Inc.  ?

## 2023-05-06 ENCOUNTER — Other Ambulatory Visit: Payer: Self-pay

## 2023-05-06 DIAGNOSIS — C9002 Multiple myeloma in relapse: Secondary | ICD-10-CM

## 2023-05-06 MED ORDER — SENNOSIDES-DOCUSATE SODIUM 8.6-50 MG PO TABS: 2.0000 | ORAL_TABLET | Freq: Every day | ORAL | 2 refills | Status: DC

## 2023-05-06 MED ORDER — SENNOSIDES-DOCUSATE SODIUM 8.6-50 MG PO TABS
2.0000 | ORAL_TABLET | Freq: Every day | ORAL | 2 refills | Status: DC
Start: 2023-05-06 — End: 2024-05-29

## 2023-05-13 ENCOUNTER — Other Ambulatory Visit: Payer: Self-pay

## 2023-05-13 DIAGNOSIS — C9000 Multiple myeloma not having achieved remission: Secondary | ICD-10-CM

## 2023-05-13 MED ORDER — FENTANYL 12 MCG/HR TD PT72: 1.0000 | MEDICATED_PATCH | TRANSDERMAL | 0 refills | Status: DC

## 2023-05-15 DIAGNOSIS — H401132 Primary open-angle glaucoma, bilateral, moderate stage: Secondary | ICD-10-CM | POA: Diagnosis not present

## 2023-05-18 ENCOUNTER — Other Ambulatory Visit: Payer: Self-pay

## 2023-05-18 ENCOUNTER — Inpatient Hospital Stay: Payer: Medicare HMO | Admitting: Hematology

## 2023-05-18 ENCOUNTER — Inpatient Hospital Stay: Payer: Medicare HMO | Attending: Hematology

## 2023-05-18 ENCOUNTER — Inpatient Hospital Stay: Payer: Medicare HMO

## 2023-05-18 VITALS — BP 148/70 | HR 61 | Temp 98.0°F | Resp 16

## 2023-05-18 DIAGNOSIS — Z7189 Other specified counseling: Secondary | ICD-10-CM

## 2023-05-18 DIAGNOSIS — M8580 Other specified disorders of bone density and structure, unspecified site: Secondary | ICD-10-CM | POA: Diagnosis not present

## 2023-05-18 DIAGNOSIS — I10 Essential (primary) hypertension: Secondary | ICD-10-CM | POA: Diagnosis not present

## 2023-05-18 DIAGNOSIS — Z5112 Encounter for antineoplastic immunotherapy: Secondary | ICD-10-CM | POA: Diagnosis not present

## 2023-05-18 DIAGNOSIS — Z7982 Long term (current) use of aspirin: Secondary | ICD-10-CM | POA: Diagnosis not present

## 2023-05-18 DIAGNOSIS — Z79899 Other long term (current) drug therapy: Secondary | ICD-10-CM | POA: Insufficient documentation

## 2023-05-18 DIAGNOSIS — C9002 Multiple myeloma in relapse: Secondary | ICD-10-CM

## 2023-05-18 DIAGNOSIS — Z79890 Hormone replacement therapy (postmenopausal): Secondary | ICD-10-CM | POA: Diagnosis not present

## 2023-05-18 DIAGNOSIS — C9000 Multiple myeloma not having achieved remission: Secondary | ICD-10-CM

## 2023-05-18 DIAGNOSIS — Z95828 Presence of other vascular implants and grafts: Secondary | ICD-10-CM

## 2023-05-18 DIAGNOSIS — D649 Anemia, unspecified: Secondary | ICD-10-CM | POA: Diagnosis not present

## 2023-05-18 DIAGNOSIS — Z7961 Long term (current) use of immunomodulator: Secondary | ICD-10-CM | POA: Insufficient documentation

## 2023-05-18 LAB — CBC WITH DIFFERENTIAL (CANCER CENTER ONLY)
Abs Immature Granulocytes: 0 10*3/uL (ref 0.00–0.07)
Basophils Absolute: 0.2 10*3/uL — ABNORMAL HIGH (ref 0.0–0.1)
Basophils Relative: 5 %
Eosinophils Absolute: 0.1 10*3/uL (ref 0.0–0.5)
Eosinophils Relative: 5 %
HCT: 33.1 % — ABNORMAL LOW (ref 36.0–46.0)
Hemoglobin: 10.8 g/dL — ABNORMAL LOW (ref 12.0–15.0)
Immature Granulocytes: 0 %
Lymphocytes Relative: 16 %
Lymphs Abs: 0.5 10*3/uL — ABNORMAL LOW (ref 0.7–4.0)
MCH: 31.7 pg (ref 26.0–34.0)
MCHC: 32.6 g/dL (ref 30.0–36.0)
MCV: 97.1 fL (ref 80.0–100.0)
Monocytes Absolute: 0.5 10*3/uL (ref 0.1–1.0)
Monocytes Relative: 18 %
Neutro Abs: 1.7 10*3/uL (ref 1.7–7.7)
Neutrophils Relative %: 56 %
Platelet Count: 280 10*3/uL (ref 150–400)
RBC: 3.41 MIL/uL — ABNORMAL LOW (ref 3.87–5.11)
RDW: 15.6 % — ABNORMAL HIGH (ref 11.5–15.5)
WBC Count: 3 10*3/uL — ABNORMAL LOW (ref 4.0–10.5)
nRBC: 0 % (ref 0.0–0.2)

## 2023-05-18 LAB — CMP (CANCER CENTER ONLY)
ALT: 10 U/L (ref 0–44)
AST: 12 U/L — ABNORMAL LOW (ref 15–41)
Albumin: 3.6 g/dL (ref 3.5–5.0)
Alkaline Phosphatase: 34 U/L — ABNORMAL LOW (ref 38–126)
Anion gap: 4 — ABNORMAL LOW (ref 5–15)
BUN: 14 mg/dL (ref 8–23)
CO2: 26 mmol/L (ref 22–32)
Calcium: 8.5 mg/dL — ABNORMAL LOW (ref 8.9–10.3)
Chloride: 107 mmol/L (ref 98–111)
Creatinine: 0.63 mg/dL (ref 0.44–1.00)
GFR, Estimated: >60 mL/min (ref >60)
Glucose, Bld: 117 mg/dL — ABNORMAL HIGH (ref 70–99)
Potassium: 4.1 mmol/L (ref 3.5–5.1)
Sodium: 137 mmol/L (ref 135–145)
Total Bilirubin: 0.4 mg/dL (ref 0.3–1.2)
Total Protein: 7.4 g/dL (ref 6.5–8.1)

## 2023-05-18 MED ORDER — FAMOTIDINE IN NACL 20-0.9 MG/50ML-% IV SOLN
20.0000 mg | Freq: Once | INTRAVENOUS | Status: AC
  Administered 2023-05-18: 20 mg via INTRAVENOUS
  Filled 2023-05-18: qty 50

## 2023-05-18 MED ORDER — SODIUM CHLORIDE 0.9% FLUSH
10.0000 mL | INTRAVENOUS | Status: DC | PRN
  Administered 2023-05-18: 10 mL

## 2023-05-18 MED ORDER — HEPARIN SOD (PORK) LOCK FLUSH 100 UNIT/ML IV SOLN
500.0000 [IU] | Freq: Once | INTRAVENOUS | Status: AC | PRN
  Administered 2023-05-18: 500 [IU]

## 2023-05-18 MED ORDER — ACETAMINOPHEN 325 MG PO TABS
650.0000 mg | ORAL_TABLET | Freq: Once | ORAL | Status: AC
  Administered 2023-05-18: 650 mg via ORAL
  Filled 2023-05-18: qty 2

## 2023-05-18 MED ORDER — SODIUM CHLORIDE 0.9% FLUSH
10.0000 mL | Freq: Once | INTRAVENOUS | Status: AC
  Administered 2023-05-18: 10 mL

## 2023-05-18 MED ORDER — SODIUM CHLORIDE 0.9 % IV SOLN
16.0000 mg/kg | Freq: Once | INTRAVENOUS | Status: AC
  Administered 2023-05-18: 800 mg via INTRAVENOUS
  Filled 2023-05-18: qty 40

## 2023-05-18 MED ORDER — SODIUM CHLORIDE 0.9 % IV SOLN: Freq: Once | INTRAVENOUS | Status: AC

## 2023-05-18 MED ORDER — HYDROXYZINE HCL 25 MG PO TABS
25.0000 mg | ORAL_TABLET | Freq: Once | ORAL | Status: AC
  Administered 2023-05-18: 25 mg via ORAL
  Filled 2023-05-18: qty 1

## 2023-05-18 NOTE — Patient Instructions (Signed)
[facility]  Discharge Instructions: Thank you for choosing [facility] to provide your oncology and hematology care.   If you have a lab appointment with the Cancer Center, please go directly to the Cancer Center and check in at the registration area.   Wear comfortable clothing and clothing appropriate for easy access to any Portacath or PICC line.   We strive to give you quality time with your provider. You may need to reschedule your appointment if you arrive late (15 or more minutes).  Arriving late affects you and other patients whose appointments are after yours.  Also, if you miss three or more appointments without notifying the office, you may be dismissed from the clinic at the provider's discretion.      For prescription refill requests, have your pharmacy contact our office and allow 72 hours for refills to be completed.    Today you received the following chemotherapy and/or immunotherapy agents: daratumumab      To help prevent nausea and vomiting after your treatment, we encourage you to take your nausea medication as directed.  BELOW ARE SYMPTOMS THAT SHOULD BE REPORTED IMMEDIATELY: *FEVER GREATER THAN 100.4 F (38 C) OR HIGHER *CHILLS OR SWEATING *NAUSEA AND VOMITING THAT IS NOT CONTROLLED WITH YOUR NAUSEA MEDICATION *UNUSUAL SHORTNESS OF BREATH *UNUSUAL BRUISING OR BLEEDING *URINARY PROBLEMS (pain or burning when urinating, or frequent urination) *BOWEL PROBLEMS (unusual diarrhea, constipation, pain near the anus) TENDERNESS IN MOUTH AND THROAT WITH OR WITHOUT PRESENCE OF ULCERS (sore throat, sores in mouth, or a toothache) UNUSUAL RASH, SWELLING OR PAIN  UNUSUAL VAGINAL DISCHARGE OR ITCHING   Items with * indicate a potential emergency and should be followed up as soon as possible or go to the Emergency Department if any problems should occur.  Please show the CHEMOTHERAPY ALERT CARD or IMMUNOTHERAPY ALERT CARD at  check-in to the Emergency Department and triage nurse.  Should you have questions after your visit or need to cancel or reschedule your appointment, please contact [facility]  Dept: [phone]  and follow the prompts.  Office hours are 8:00 a.m. to 4:30 p.m. Monday - Friday. Please note that voicemails left after 4:00 p.m. may not be returned until the following business day.  We are closed weekends and major holidays. You have access to a nurse at all times for urgent questions. Please call the main number to the clinic Dept: [phone] and follow the prompts.   For any non-urgent questions, you may also contact your provider using MyChart. We now offer e-Visits for anyone 18 and older to request care online for non-urgent symptoms. For details visit [email or web address].   Also download the MyChart app! Go to the app store, search "MyChart", open the app, select [facility], and log in with your MyChart username and password.  

## 2023-05-18 NOTE — Progress Notes (Signed)
Patient seen by Dr. Kale  Vitals are within treatment parameters.  Labs reviewed: and are within treatment parameters.  Per physician team, patient is ready for treatment and there are NO modifications to the treatment plan.  

## 2023-05-18 NOTE — Progress Notes (Signed)
[facility]   [phone] [phone]   Clinic Follow up Note   Patient Care Team: Shelva Majestic, MD as PCP - General (Family Medicine) Johney Maine, MD as Consulting Physician (Hematology) Bond, Doran Stabler, MD as Referring Physician (Ophthalmology) Zigmund Daniel, Maureen Ralphs, MD as Consulting Physician (Hematology and Oncology) Carlus Pavlov, MD as Consulting Physician (Internal Medicine) Darnell Level, MD as Consulting Physician (General Surgery) Erroll Luna, [facility] (Inactive) as Pharmacist (Pharmacist)  Date of Service: 05/18/2023  CHIEF COMPLAINT:  Follow-up for continuation and management of multiple myeloma   SUMMARY OF ONCOLOGIC HISTORY: Oncology History  Multiple myeloma not having achieved remission (HCC)  12/12/2018 Initial Diagnosis   Multiple myeloma not having achieved remission (HCC)   12/20/2018 - 10/04/2019 Chemotherapy   The patient had dexamethasone (DECADRON) tablet 20 mg, 20 mg (100 % of original dose 20 mg), Oral, Once, 14 of 14 cycles Dose modification: 20 mg (original dose 20 mg, Cycle 1), 10 mg (original dose 20 mg, Cycle 14) Administration: 20 mg (12/20/2018), 20 mg (12/27/2018), 20 mg (01/10/2019), 20 mg (01/17/2019), 20 mg (01/31/2019), 20 mg (02/07/2019), 20 mg (03/14/2019), 20 mg (03/21/2019), 20 mg (02/21/2019), 20 mg (02/28/2019), 20 mg (04/04/2019), 20 mg (04/11/2019), 20 mg (04/25/2019), 20 mg (05/02/2019), 20 mg (05/16/2019), 20 mg (05/23/2019), 20 mg (06/06/2019), 20 mg (06/13/2019), 20 mg (06/27/2019), 20 mg (07/04/2019), 20 mg (07/18/2019), 20 mg (07/25/2019), 20 mg (08/08/2019), 20 mg (08/15/2019), 20 mg (08/29/2019), 20 mg (09/05/2019), 10 mg (09/19/2019) lenalidomide (REVLIMID) 15 MG capsule, 1 of 1 cycle, Start date: 01/18/2019, End date: 02/21/2019 bortezomib SQ (VELCADE) chemo injection 2 mg, 1.3 mg/m2 = 2 mg, Subcutaneous,  Once, 14 of 14 cycles Administration: 2 mg (12/20/2018), 2 mg (12/27/2018), 2 mg (12/23/2018), 2 mg  (12/30/2018), 2 mg (01/10/2019), 2 mg (01/17/2019), 2 mg (01/13/2019), 2 mg (01/20/2019), 2 mg (01/31/2019), 2 mg (02/07/2019), 2 mg (02/03/2019), 2 mg (02/10/2019), 2 mg (03/14/2019), 2 mg (03/17/2019), 2 mg (03/21/2019), 2 mg (03/24/2019), 2 mg (02/21/2019), 2 mg (02/24/2019), 2 mg (02/28/2019), 2 mg (03/03/2019), 2 mg (04/04/2019), 2 mg (04/06/2019), 2 mg (04/11/2019), 2 mg (04/14/2019), 2 mg (04/25/2019), 2 mg (04/28/2019), 2 mg (05/02/2019), 2 mg (05/05/2019), 2 mg (05/16/2019), 2 mg (05/19/2019), 2 mg (05/23/2019), 2 mg (05/26/2019), 2 mg (06/06/2019), 2 mg (06/09/2019), 2 mg (06/13/2019), 2 mg (06/16/2019), 2 mg (06/27/2019), 2 mg (06/30/2019), 2 mg (07/04/2019), 2 mg (07/07/2019), 2 mg (07/18/2019), 2 mg (07/21/2019), 2 mg (07/25/2019), 2 mg (07/28/2019), 2 mg (08/08/2019), 2 mg (08/11/2019), 2 mg (08/15/2019), 2 mg (08/18/2019), 2 mg (08/29/2019), 2 mg (09/01/2019), 2 mg (09/05/2019), 2 mg (09/08/2019), 2 mg (09/19/2019), 2 mg (10/04/2019)  for chemotherapy treatment.    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 female returns for continued evaluation and management of multiple myeloma. 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   Patient was last seen by  me on 04/20/2023 and she complained of weight loss due to respiratory infection, occasional tenderness in her left hip on palpation, and toe fungus in left foot.   Patient notes she has been doing well overall since our last visit. She complains of small lump  near her right collar bone. Patient notes she first noticed the lump at the end of June. She notes that the lump is not painful and it has not increased in size.   Patient notes she noticed a small nodule on the skull, which disappeared after a week.   She started her Pomalidomide 3 mg medication today.   Patient notes she has an upcoming appointment with [facility] on August 21st for 4th or 5th line of treatment.   She denies any new infection issues, fever, chills, night sweats, unexpected weight loss, chest pain, abdominal pain, back pain, or leg swelling.   MEDICAL HISTORY:  Past Medical History:  Diagnosis Date   Anemia    Glaucoma    HYPERTENSION 03/11/2007   Multiple myeloma (HCC)    OSTEOPENIA 03/11/2007   Pre-diabetes    Thyroid cancer (HCC)    Thyroid disease    Tubular adenoma of colon 07/2015    SURGICAL HISTORY: Past Surgical History:  Procedure Laterality Date   BONE MARROW BIOPSY     multiple   BREAST EXCISIONAL BIOPSY Right 2000   BREAST LUMPECTOMY  1990   benign   CATARACT EXTRACTION Bilateral 2018   DILATION AND CURETTAGE OF UTERUS     bleeding at menopause. No uterine cancer   IR IMAGING GUIDED PORT INSERTION  05/16/2021   IR RADIOLOGIST EVAL & MGMT  12/13/2018   THYROIDECTOMY N/A 08/02/2020   Procedure: TOTAL THYROIDECTOMY;  Surgeon: Darnell Level, MD;  Location: [facility];  Service: General;  Laterality: N/A;   TONSILLECTOMY     age 73   I have reviewed the social history and family history with the patient and they are unchanged from previous note.  ALLERGIES:  is allergic to ace inhibitors, benadryl [diphenhydramine], diamox [acetazolamide], sulfamethoxazole, lenalidomide, and penicillins.  MEDICATIONS:  Current Outpatient Medications  Medication Sig Dispense Refill   acyclovir (ZOVIRAX) 400 MG tablet Take 1 tablet (400 mg total) by mouth 2 (two) times daily. 60 tablet 3   ALPHAGAN P 0.1 % SOLN Place 1 drop into both eyes in the morning, at  noon, and at bedtime.      amLODipine (NORVASC) 10 MG tablet Take 1 tablet (10 mg total) by mouth daily. 90 tablet 3   aspirin EC 81 MG tablet Take 81 mg by mouth daily.     Cholecalciferol (VITAMIN D3) 125 MCG (5000 UT) TABS Take 5,000 Units by mouth daily.     Co-Enzyme Q-10 100 MG CAPS Take 100 mg by mouth daily.     dexamethasone (DECADRON) 4 MG tablet Take 4 tablets (16 mg total) by mouth as directed. Take one hour prior to monthly Darzalex infusion. 12 tablet 3   dorzolamide-timolol (COSOPT) 22.3-6.8 MG/ML ophthalmic solution Place 1 drop into both eyes 2 (two) times daily.   11   fentaNYL (DURAGESIC) 12 MCG/HR Place 1 patch onto the skin every 3 (three) days. 10 patch 0   levothyroxine (SYNTHROID) 88 MCG tablet Take 1 tablet (88 mcg total) by mouth daily before breakfast. 90 tablet 3   lidocaine-prilocaine (EMLA) cream Apply to affected area once 30 g 3   magnesium chloride (SLOW-MAG) 64 MG TBEC SR tablet Take by mouth.  Multiple Vitamins-Minerals (MULTIVITAMIN WITH MINERALS) tablet Take 1 tablet by mouth daily. Centrum Silver 50 +     Netarsudil-Latanoprost (ROCKLATAN) 0.02-0.005 % SOLN Place 1 drop into both eyes daily.     Omega-3 1000 MG CAPS Take 1,000 mg by mouth daily.      ondansetron (ZOFRAN) 8 MG tablet Take 1 tablet (8 mg total) by mouth every 8 (eight) hours as needed for nausea or vomiting. 30 tablet 1   oxyCODONE-acetaminophen (PERCOCET) 5-325 MG tablet Take 1-2 tablets by mouth every 4 (four) hours as needed for moderate pain or severe pain. 90 tablet 0   polyethylene glycol (MIRALAX) packet Take 17 g by mouth daily. 30 each 1   pomalidomide (POMALYST) 3 MG capsule Take 1 capsule (3 mg total) by mouth daily. Take for 21 days on, 7 days off, repeat every 28 days 21 capsule 1   prochlorperazine (COMPAZINE) 10 MG tablet Take 1 tablet (10 mg total) by mouth every 6 (six) hours as needed for nausea or vomiting. 30 tablet 1   senna-docusate (EQ STOOL SOFTENER/LAXATIVE) 8.6-50 MG  tablet Take 2 tablets by mouth at bedtime. 60 tablet 2   Simethicone (GAS-X PO) Take 1 tablet by mouth as needed.     vitamin C (ASCORBIC ACID) 500 MG tablet Take 500 mg by mouth 2 (two) times a week.      No current facility-administered medications for this visit.    PHYSICAL EXAMINATION:. BP (!) 159/78   Pulse 61   Temp 98.7 F (37.1 C)   Resp 18   Wt 105 lb 1.6 oz (47.7 kg)   SpO2 100%   BMI 20.53 kg/m  GENERAL:alert, in no acute distress and comfortable SKIN: no acute rashes, no significant lesions EYES: conjunctiva are pink and non-injected, sclera anicteric OROPHARYNX: MMM, no exudates, no oropharyngeal erythema or ulceration NECK: supple, no JVD LYMPH:  no palpable lymphadenopathy in the cervical, axillary or inguinal regions LUNGS: clear to auscultation b/l with normal respiratory effort HEART: regular rate & rhythm ABDOMEN:  normoactive bowel sounds , non tender, not distended. Extremity: no pedal edema PSYCH: alert & oriented x 3 with fluent speech NEURO: no focal motor/sensory deficits    LABORATORY DATA:     Latest Ref Rng & Units 05/18/2023   10:45 AM 05/05/2023    9:20 AM 05/04/2023    8:05 AM  CBC  WBC 4.0 - 10.5 K/uL 3.0  6.9  4.7   Hemoglobin 12.0 - 15.0 g/dL 16.1  09.6  04.5   Hematocrit 36.0 - 46.0 % 33.1  34.7  33.1   Platelets 150 - 400 K/uL 280  220  213       Latest Ref Rng & Units 05/18/2023   10:45 AM 05/04/2023    8:05 AM 04/20/2023    9:33 AM  CMP  Glucose 70 - 99 mg/dL 409  811  914   BUN 8 - 23 mg/dL 14  14  17    Creatinine 0.44 - 1.00 mg/dL 7.82  9.56  2.13   Sodium 135 - 145 mmol/L 137  137  137   Potassium 3.5 - 5.1 mmol/L 4.1  4.1  4.2   Chloride 98 - 111 mmol/L 107  107  106   CO2 22 - 32 mmol/L 26  25  26    Calcium 8.9 - 10.3 mg/dL 8.5  8.7  8.8   Total Protein 6.5 - 8.1 g/dL 7.4  7.0  7.2   Total Bilirubin 0.3 - 1.2 mg/dL  0.4  0.3  0.3   Alkaline Phos 38 - 126 U/L 34  32  34   AST 15 - 41 U/L 12  11  12    ALT 0 - 44 U/L 10   12  12     Component     Latest Ref Rng 12/22/2022  Alpha 1     0.0 - 0.4 g/dL 0.3 (C)  IgG (Immunoglobin G), Serum     586 - 1,602 mg/dL 161   IgM (Immunoglobulin M), Srm     26 - 217 mg/dL 10 (L)   Globulin, Total     2.2 - 3.9 g/dL 2.7 (C)  IgA     64 - 096 mg/dL 34 (L)   Total Protein ELP     6.0 - 8.5 g/dL 6.4 (C)  Albumin SerPl Elph-Mcnc     2.9 - 4.4 g/dL 3.7 (C)  Alpha2 Glob SerPl Elph-Mcnc     0.4 - 1.0 g/dL 0.7 (C)  B-Globulin SerPl Elph-Mcnc     0.7 - 1.3 g/dL 1.5 (H) (C)  Gamma Glob SerPl Elph-Mcnc     0.4 - 1.8 g/dL 0.2 (L) (C)  M Protein SerPl Elph-Mcnc     Not Observed g/dL 0.9 (H) (C)  Albumin/Glob SerPl     0.7 - 1.7  1.4 (C)  IFE 1 Comment ! (C)  Please Note (HCV): Comment (C)     RADIOGRAPHIC STUDIES: I have personally reviewed the radiological images as listed and agreed with the findings in the report. No results found.   ASSESSMENT & PLAN:   Megan Olson is a 82 y.o. female who presents for a follow up for multiple myeloma.   1. 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  She achieved complete response with negative M protein and negative PET scan in December 2020. -She subsequently went on Ninlaro maintenance therapy -PET scan from May 08, 2021, which showed focal activity within the right femur, consistent with active multiple myeloma, no other hypermetabolic lesion or plasmacytoma. -05/06/2021 bone marrow biopsy, which showed 3% plasma cells. -Recommend Zometa every 2 months.  2. Mild anemia -We will continue to monitor with myeloma treatment and current medication  3.  Cancer related pain  -Currently well controlled -Continue fentanyl patch at 12 mcg/h and Percocet for break through pain.    PLAN: -Discussed lab results from today, 05/18/2023, with the patient. CBC  shows decreased WBC at 3.0 K, decreased hemoglobin at 10.8 g/dL, and decreased hematocrit at 33.1%. CMP shows slightly elevated glucose level at 117, slightly decreased calcium level at 8.5, decreased alkaline phosphate level at 34, and decreased AST at 12.  -Discussed the multiple myeloma panel results from 04/20/2023 which showed elevated IgG at 2,046. And M spike of 1.7g/dl -Discussed the bone marrow biopsy results from 05/05/2023 with the patient. The plasma cells show variable distribution and while they represent 4%. -Discussed the PET scan results from 05/05/2023 with the patient. Showed Multifocal hypermetabolic osseous lesions consistent with active multiple myeloma. Hypermetabolic soft tissue lesions in the chest wall, adjacent to the right clavicle, along the pleural surface, adjacent to the right L4 transverse process and posterior to the left humerus consistent with soft tissue involvement of multiple myeloma. -Discussed the important of exploring subsequent treatment options at [facility] including Car-T cell therapy and other treatment  options, BITE therapies such asTeclistamab, such as  in detail with the patient.  -Answered all of patient's questions in detail regarding PET scan result, bone marrow pathology results, and questions regarding next treatment options.  -Continue Pomalidomide 3 mg.  -Patient can proceed with treatment today without any dose modifications.   FOLLOW-UP: Per integrated scheduling  The total time spent in the appointment was 32 minutes* .  All of the patient's questions were answered with apparent satisfaction. The patient knows to call the clinic with any problems, questions or concerns.   Wyvonnia Lora MD MS AAHIVMS [facility] Hematology/Oncology Physician [facility]  .*Total Encounter Time as defined by the Centers for Medicare and Medicaid Services includes, in addition to the face-to-face time of a patient visit (documented in the note  above) non-face-to-face time: obtaining and reviewing outside history, ordering and reviewing medications, tests or procedures, care coordination (communications with other health care professionals or caregivers) and documentation in the medical record.   I, Ok Edwards, acting as a Neurosurgeon for Wyvonnia Lora, MD., have documented all relevant documentation on the behalf of Wyvonnia Lora, MD,as directed by  Wyvonnia Lora, MD while in the presence of Wyvonnia Lora, MD.   .I have reviewed the above documentation for accuracy and completeness, and I agree with the above. Johney Maine MD

## 2023-05-20 ENCOUNTER — Ambulatory Visit (HOSPITAL_COMMUNITY): Payer: Medicare HMO

## 2023-05-20 DIAGNOSIS — C9000 Multiple myeloma not having achieved remission: Secondary | ICD-10-CM | POA: Diagnosis not present

## 2023-05-21 LAB — MULTIPLE MYELOMA PANEL, SERUM

## 2023-05-24 ENCOUNTER — Encounter: Payer: Self-pay | Admitting: Hematology

## 2023-05-26 ENCOUNTER — Other Ambulatory Visit: Payer: Self-pay

## 2023-05-26 DIAGNOSIS — C9000 Multiple myeloma not having achieved remission: Secondary | ICD-10-CM | POA: Diagnosis not present

## 2023-05-26 DIAGNOSIS — C9002 Multiple myeloma in relapse: Secondary | ICD-10-CM

## 2023-05-26 DIAGNOSIS — Z7189 Other specified counseling: Secondary | ICD-10-CM

## 2023-05-26 DIAGNOSIS — C73 Malignant neoplasm of thyroid gland: Secondary | ICD-10-CM | POA: Diagnosis not present

## 2023-05-26 MED ORDER — POMALIDOMIDE 3 MG PO CAPS
3.0000 mg | ORAL_CAPSULE | Freq: Every day | ORAL | 1 refills | Status: DC
Start: 2023-05-26 — End: 2024-02-21

## 2023-05-28 ENCOUNTER — Inpatient Hospital Stay: Payer: Medicare HMO | Admitting: Hematology

## 2023-06-01 ENCOUNTER — Inpatient Hospital Stay: Payer: Medicare HMO

## 2023-06-04 ENCOUNTER — Ambulatory Visit (HOSPITAL_COMMUNITY): Payer: Medicare HMO

## 2023-06-04 DIAGNOSIS — L723 Sebaceous cyst: Secondary | ICD-10-CM | POA: Diagnosis not present

## 2023-06-04 DIAGNOSIS — D225 Melanocytic nevi of trunk: Secondary | ICD-10-CM | POA: Diagnosis not present

## 2023-06-10 DIAGNOSIS — Z5111 Encounter for antineoplastic chemotherapy: Secondary | ICD-10-CM | POA: Diagnosis not present

## 2023-06-10 DIAGNOSIS — C9000 Multiple myeloma not having achieved remission: Secondary | ICD-10-CM | POA: Diagnosis not present

## 2023-06-15 ENCOUNTER — Inpatient Hospital Stay: Payer: Medicare HMO

## 2023-06-15 ENCOUNTER — Inpatient Hospital Stay: Payer: Medicare HMO | Admitting: Hematology

## 2023-06-17 DIAGNOSIS — C9000 Multiple myeloma not having achieved remission: Secondary | ICD-10-CM | POA: Diagnosis not present

## 2023-06-17 DIAGNOSIS — Z5111 Encounter for antineoplastic chemotherapy: Secondary | ICD-10-CM | POA: Diagnosis not present

## 2023-06-24 DIAGNOSIS — C9000 Multiple myeloma not having achieved remission: Secondary | ICD-10-CM | POA: Diagnosis not present

## 2023-06-24 DIAGNOSIS — Z5111 Encounter for antineoplastic chemotherapy: Secondary | ICD-10-CM | POA: Diagnosis not present

## 2023-06-29 ENCOUNTER — Ambulatory Visit: Payer: Medicare HMO

## 2023-06-29 ENCOUNTER — Other Ambulatory Visit: Payer: Medicare HMO

## 2023-07-01 DIAGNOSIS — Z5111 Encounter for antineoplastic chemotherapy: Secondary | ICD-10-CM | POA: Diagnosis not present

## 2023-07-01 DIAGNOSIS — C9000 Multiple myeloma not having achieved remission: Secondary | ICD-10-CM | POA: Diagnosis not present

## 2023-07-07 DIAGNOSIS — D72820 Lymphocytosis (symptomatic): Secondary | ICD-10-CM | POA: Diagnosis not present

## 2023-07-07 DIAGNOSIS — D630 Anemia in neoplastic disease: Secondary | ICD-10-CM | POA: Diagnosis not present

## 2023-07-07 DIAGNOSIS — Z5112 Encounter for antineoplastic immunotherapy: Secondary | ICD-10-CM | POA: Diagnosis not present

## 2023-07-07 DIAGNOSIS — D649 Anemia, unspecified: Secondary | ICD-10-CM | POA: Diagnosis not present

## 2023-07-07 DIAGNOSIS — C73 Malignant neoplasm of thyroid gland: Secondary | ICD-10-CM | POA: Diagnosis not present

## 2023-07-07 DIAGNOSIS — R200 Anesthesia of skin: Secondary | ICD-10-CM | POA: Diagnosis not present

## 2023-07-07 DIAGNOSIS — Z5111 Encounter for antineoplastic chemotherapy: Secondary | ICD-10-CM | POA: Diagnosis not present

## 2023-07-07 DIAGNOSIS — D72810 Lymphocytopenia: Secondary | ICD-10-CM | POA: Diagnosis not present

## 2023-07-07 DIAGNOSIS — G894 Chronic pain syndrome: Secondary | ICD-10-CM | POA: Diagnosis not present

## 2023-07-07 DIAGNOSIS — C9000 Multiple myeloma not having achieved remission: Secondary | ICD-10-CM | POA: Diagnosis not present

## 2023-07-07 DIAGNOSIS — D539 Nutritional anemia, unspecified: Secondary | ICD-10-CM | POA: Diagnosis not present

## 2023-07-11 ENCOUNTER — Encounter: Payer: Self-pay | Admitting: Hematology

## 2023-07-11 NOTE — Progress Notes (Signed)
This encounter was created in error - please disregard.

## 2023-07-15 DIAGNOSIS — Z5181 Encounter for therapeutic drug level monitoring: Secondary | ICD-10-CM | POA: Diagnosis not present

## 2023-07-15 DIAGNOSIS — C9000 Multiple myeloma not having achieved remission: Secondary | ICD-10-CM | POA: Diagnosis not present

## 2023-07-16 DIAGNOSIS — C9000 Multiple myeloma not having achieved remission: Secondary | ICD-10-CM | POA: Diagnosis not present

## 2023-07-16 DIAGNOSIS — R220 Localized swelling, mass and lump, head: Secondary | ICD-10-CM | POA: Diagnosis not present

## 2023-07-16 DIAGNOSIS — Z5181 Encounter for therapeutic drug level monitoring: Secondary | ICD-10-CM | POA: Diagnosis not present

## 2023-07-17 DIAGNOSIS — Z5181 Encounter for therapeutic drug level monitoring: Secondary | ICD-10-CM | POA: Diagnosis not present

## 2023-07-19 ENCOUNTER — Encounter: Payer: Self-pay | Admitting: Family Medicine

## 2023-07-19 DIAGNOSIS — D649 Anemia, unspecified: Secondary | ICD-10-CM | POA: Diagnosis not present

## 2023-07-19 DIAGNOSIS — R200 Anesthesia of skin: Secondary | ICD-10-CM | POA: Diagnosis not present

## 2023-07-19 DIAGNOSIS — Z5181 Encounter for therapeutic drug level monitoring: Secondary | ICD-10-CM | POA: Diagnosis not present

## 2023-07-19 DIAGNOSIS — D72820 Lymphocytosis (symptomatic): Secondary | ICD-10-CM | POA: Diagnosis not present

## 2023-07-19 DIAGNOSIS — G894 Chronic pain syndrome: Secondary | ICD-10-CM | POA: Diagnosis not present

## 2023-07-19 DIAGNOSIS — Z5112 Encounter for antineoplastic immunotherapy: Secondary | ICD-10-CM | POA: Diagnosis not present

## 2023-07-19 DIAGNOSIS — C73 Malignant neoplasm of thyroid gland: Secondary | ICD-10-CM | POA: Diagnosis not present

## 2023-07-19 DIAGNOSIS — D6481 Anemia due to antineoplastic chemotherapy: Secondary | ICD-10-CM | POA: Diagnosis not present

## 2023-07-19 DIAGNOSIS — D630 Anemia in neoplastic disease: Secondary | ICD-10-CM | POA: Diagnosis not present

## 2023-07-19 DIAGNOSIS — Z87891 Personal history of nicotine dependence: Secondary | ICD-10-CM | POA: Diagnosis not present

## 2023-07-19 DIAGNOSIS — C9000 Multiple myeloma not having achieved remission: Secondary | ICD-10-CM | POA: Diagnosis not present

## 2023-07-19 DIAGNOSIS — D539 Nutritional anemia, unspecified: Secondary | ICD-10-CM | POA: Diagnosis not present

## 2023-07-19 DIAGNOSIS — T451X5A Adverse effect of antineoplastic and immunosuppressive drugs, initial encounter: Secondary | ICD-10-CM | POA: Diagnosis not present

## 2023-07-19 DIAGNOSIS — D72810 Lymphocytopenia: Secondary | ICD-10-CM | POA: Diagnosis not present

## 2023-07-19 DIAGNOSIS — Z5111 Encounter for antineoplastic chemotherapy: Secondary | ICD-10-CM | POA: Diagnosis not present

## 2023-07-19 DIAGNOSIS — Z79899 Other long term (current) drug therapy: Secondary | ICD-10-CM | POA: Diagnosis not present

## 2023-07-20 ENCOUNTER — Encounter: Payer: Self-pay | Admitting: Hematology

## 2023-07-20 DIAGNOSIS — C73 Malignant neoplasm of thyroid gland: Secondary | ICD-10-CM | POA: Diagnosis not present

## 2023-07-20 DIAGNOSIS — D539 Nutritional anemia, unspecified: Secondary | ICD-10-CM | POA: Diagnosis not present

## 2023-07-20 DIAGNOSIS — D630 Anemia in neoplastic disease: Secondary | ICD-10-CM | POA: Diagnosis not present

## 2023-07-20 DIAGNOSIS — D6481 Anemia due to antineoplastic chemotherapy: Secondary | ICD-10-CM | POA: Diagnosis not present

## 2023-07-20 DIAGNOSIS — G894 Chronic pain syndrome: Secondary | ICD-10-CM | POA: Diagnosis not present

## 2023-07-20 DIAGNOSIS — Z5111 Encounter for antineoplastic chemotherapy: Secondary | ICD-10-CM | POA: Diagnosis not present

## 2023-07-20 DIAGNOSIS — R200 Anesthesia of skin: Secondary | ICD-10-CM | POA: Diagnosis not present

## 2023-07-20 DIAGNOSIS — Z87891 Personal history of nicotine dependence: Secondary | ICD-10-CM | POA: Diagnosis not present

## 2023-07-20 DIAGNOSIS — Z79899 Other long term (current) drug therapy: Secondary | ICD-10-CM | POA: Diagnosis not present

## 2023-07-20 DIAGNOSIS — C9000 Multiple myeloma not having achieved remission: Secondary | ICD-10-CM | POA: Diagnosis not present

## 2023-07-20 DIAGNOSIS — D72810 Lymphocytopenia: Secondary | ICD-10-CM | POA: Diagnosis not present

## 2023-07-20 DIAGNOSIS — T451X5A Adverse effect of antineoplastic and immunosuppressive drugs, initial encounter: Secondary | ICD-10-CM | POA: Diagnosis not present

## 2023-07-20 DIAGNOSIS — Z5112 Encounter for antineoplastic immunotherapy: Secondary | ICD-10-CM | POA: Diagnosis not present

## 2023-07-20 DIAGNOSIS — D649 Anemia, unspecified: Secondary | ICD-10-CM | POA: Diagnosis not present

## 2023-07-20 DIAGNOSIS — D72820 Lymphocytosis (symptomatic): Secondary | ICD-10-CM | POA: Diagnosis not present

## 2023-07-20 DIAGNOSIS — Z5181 Encounter for therapeutic drug level monitoring: Secondary | ICD-10-CM | POA: Diagnosis not present

## 2023-07-20 NOTE — Telephone Encounter (Signed)
TC

## 2023-07-21 DIAGNOSIS — G894 Chronic pain syndrome: Secondary | ICD-10-CM | POA: Diagnosis not present

## 2023-07-21 DIAGNOSIS — D72820 Lymphocytosis (symptomatic): Secondary | ICD-10-CM | POA: Diagnosis not present

## 2023-07-21 DIAGNOSIS — R200 Anesthesia of skin: Secondary | ICD-10-CM | POA: Diagnosis not present

## 2023-07-21 DIAGNOSIS — Z5111 Encounter for antineoplastic chemotherapy: Secondary | ICD-10-CM | POA: Diagnosis not present

## 2023-07-21 DIAGNOSIS — Z87891 Personal history of nicotine dependence: Secondary | ICD-10-CM | POA: Diagnosis not present

## 2023-07-21 DIAGNOSIS — Z5181 Encounter for therapeutic drug level monitoring: Secondary | ICD-10-CM | POA: Diagnosis not present

## 2023-07-21 DIAGNOSIS — C9000 Multiple myeloma not having achieved remission: Secondary | ICD-10-CM | POA: Diagnosis not present

## 2023-07-21 DIAGNOSIS — C73 Malignant neoplasm of thyroid gland: Secondary | ICD-10-CM | POA: Diagnosis not present

## 2023-07-21 DIAGNOSIS — D6481 Anemia due to antineoplastic chemotherapy: Secondary | ICD-10-CM | POA: Diagnosis not present

## 2023-07-21 DIAGNOSIS — D630 Anemia in neoplastic disease: Secondary | ICD-10-CM | POA: Diagnosis not present

## 2023-07-21 DIAGNOSIS — D649 Anemia, unspecified: Secondary | ICD-10-CM | POA: Diagnosis not present

## 2023-07-21 DIAGNOSIS — D72810 Lymphocytopenia: Secondary | ICD-10-CM | POA: Diagnosis not present

## 2023-07-21 DIAGNOSIS — T451X5A Adverse effect of antineoplastic and immunosuppressive drugs, initial encounter: Secondary | ICD-10-CM | POA: Diagnosis not present

## 2023-07-21 DIAGNOSIS — D539 Nutritional anemia, unspecified: Secondary | ICD-10-CM | POA: Diagnosis not present

## 2023-07-21 DIAGNOSIS — Z79899 Other long term (current) drug therapy: Secondary | ICD-10-CM | POA: Diagnosis not present

## 2023-07-21 DIAGNOSIS — Z5112 Encounter for antineoplastic immunotherapy: Secondary | ICD-10-CM | POA: Diagnosis not present

## 2023-07-22 DIAGNOSIS — Z5181 Encounter for therapeutic drug level monitoring: Secondary | ICD-10-CM | POA: Diagnosis not present

## 2023-07-23 DIAGNOSIS — Z5181 Encounter for therapeutic drug level monitoring: Secondary | ICD-10-CM | POA: Diagnosis not present

## 2023-07-24 DIAGNOSIS — Z5181 Encounter for therapeutic drug level monitoring: Secondary | ICD-10-CM | POA: Diagnosis not present

## 2023-07-29 DIAGNOSIS — D539 Nutritional anemia, unspecified: Secondary | ICD-10-CM | POA: Diagnosis not present

## 2023-07-29 DIAGNOSIS — C73 Malignant neoplasm of thyroid gland: Secondary | ICD-10-CM | POA: Diagnosis not present

## 2023-07-29 DIAGNOSIS — D649 Anemia, unspecified: Secondary | ICD-10-CM | POA: Diagnosis not present

## 2023-07-29 DIAGNOSIS — Z5112 Encounter for antineoplastic immunotherapy: Secondary | ICD-10-CM | POA: Diagnosis not present

## 2023-07-29 DIAGNOSIS — C9000 Multiple myeloma not having achieved remission: Secondary | ICD-10-CM | POA: Diagnosis not present

## 2023-07-29 DIAGNOSIS — D72810 Lymphocytopenia: Secondary | ICD-10-CM | POA: Diagnosis not present

## 2023-07-29 DIAGNOSIS — G894 Chronic pain syndrome: Secondary | ICD-10-CM | POA: Diagnosis not present

## 2023-07-29 DIAGNOSIS — Z5111 Encounter for antineoplastic chemotherapy: Secondary | ICD-10-CM | POA: Diagnosis not present

## 2023-07-29 DIAGNOSIS — R200 Anesthesia of skin: Secondary | ICD-10-CM | POA: Diagnosis not present

## 2023-07-29 DIAGNOSIS — D72820 Lymphocytosis (symptomatic): Secondary | ICD-10-CM | POA: Diagnosis not present

## 2023-07-29 DIAGNOSIS — D630 Anemia in neoplastic disease: Secondary | ICD-10-CM | POA: Diagnosis not present

## 2023-08-04 DIAGNOSIS — G894 Chronic pain syndrome: Secondary | ICD-10-CM | POA: Diagnosis not present

## 2023-08-04 DIAGNOSIS — D72820 Lymphocytosis (symptomatic): Secondary | ICD-10-CM | POA: Diagnosis not present

## 2023-08-04 DIAGNOSIS — D72810 Lymphocytopenia: Secondary | ICD-10-CM | POA: Diagnosis not present

## 2023-08-04 DIAGNOSIS — Z5112 Encounter for antineoplastic immunotherapy: Secondary | ICD-10-CM | POA: Diagnosis not present

## 2023-08-04 DIAGNOSIS — D630 Anemia in neoplastic disease: Secondary | ICD-10-CM | POA: Diagnosis not present

## 2023-08-04 DIAGNOSIS — D539 Nutritional anemia, unspecified: Secondary | ICD-10-CM | POA: Diagnosis not present

## 2023-08-04 DIAGNOSIS — D649 Anemia, unspecified: Secondary | ICD-10-CM | POA: Diagnosis not present

## 2023-08-04 DIAGNOSIS — R200 Anesthesia of skin: Secondary | ICD-10-CM | POA: Diagnosis not present

## 2023-08-04 DIAGNOSIS — Z5111 Encounter for antineoplastic chemotherapy: Secondary | ICD-10-CM | POA: Diagnosis not present

## 2023-08-04 DIAGNOSIS — C9000 Multiple myeloma not having achieved remission: Secondary | ICD-10-CM | POA: Diagnosis not present

## 2023-08-04 DIAGNOSIS — C73 Malignant neoplasm of thyroid gland: Secondary | ICD-10-CM | POA: Diagnosis not present

## 2023-08-09 DIAGNOSIS — H401132 Primary open-angle glaucoma, bilateral, moderate stage: Secondary | ICD-10-CM | POA: Diagnosis not present

## 2023-08-11 DIAGNOSIS — R200 Anesthesia of skin: Secondary | ICD-10-CM | POA: Diagnosis not present

## 2023-08-11 DIAGNOSIS — D539 Nutritional anemia, unspecified: Secondary | ICD-10-CM | POA: Diagnosis not present

## 2023-08-11 DIAGNOSIS — C9000 Multiple myeloma not having achieved remission: Secondary | ICD-10-CM | POA: Diagnosis not present

## 2023-08-11 DIAGNOSIS — G894 Chronic pain syndrome: Secondary | ICD-10-CM | POA: Diagnosis not present

## 2023-08-11 DIAGNOSIS — Z5111 Encounter for antineoplastic chemotherapy: Secondary | ICD-10-CM | POA: Diagnosis not present

## 2023-08-11 DIAGNOSIS — D72810 Lymphocytopenia: Secondary | ICD-10-CM | POA: Diagnosis not present

## 2023-08-11 DIAGNOSIS — C73 Malignant neoplasm of thyroid gland: Secondary | ICD-10-CM | POA: Diagnosis not present

## 2023-08-11 DIAGNOSIS — Z5112 Encounter for antineoplastic immunotherapy: Secondary | ICD-10-CM | POA: Diagnosis not present

## 2023-08-18 DIAGNOSIS — C73 Malignant neoplasm of thyroid gland: Secondary | ICD-10-CM | POA: Diagnosis not present

## 2023-08-18 DIAGNOSIS — Z5112 Encounter for antineoplastic immunotherapy: Secondary | ICD-10-CM | POA: Diagnosis not present

## 2023-08-18 DIAGNOSIS — R200 Anesthesia of skin: Secondary | ICD-10-CM | POA: Diagnosis not present

## 2023-08-18 DIAGNOSIS — D72810 Lymphocytopenia: Secondary | ICD-10-CM | POA: Diagnosis not present

## 2023-08-18 DIAGNOSIS — C9000 Multiple myeloma not having achieved remission: Secondary | ICD-10-CM | POA: Diagnosis not present

## 2023-08-18 DIAGNOSIS — G894 Chronic pain syndrome: Secondary | ICD-10-CM | POA: Diagnosis not present

## 2023-08-18 DIAGNOSIS — D539 Nutritional anemia, unspecified: Secondary | ICD-10-CM | POA: Diagnosis not present

## 2023-08-18 DIAGNOSIS — Z5111 Encounter for antineoplastic chemotherapy: Secondary | ICD-10-CM | POA: Diagnosis not present

## 2023-08-23 ENCOUNTER — Encounter: Payer: Self-pay | Admitting: Hematology

## 2023-08-25 DIAGNOSIS — D539 Nutritional anemia, unspecified: Secondary | ICD-10-CM | POA: Diagnosis not present

## 2023-08-25 DIAGNOSIS — D72810 Lymphocytopenia: Secondary | ICD-10-CM | POA: Diagnosis not present

## 2023-08-25 DIAGNOSIS — G894 Chronic pain syndrome: Secondary | ICD-10-CM | POA: Diagnosis not present

## 2023-08-25 DIAGNOSIS — Z5111 Encounter for antineoplastic chemotherapy: Secondary | ICD-10-CM | POA: Diagnosis not present

## 2023-08-25 DIAGNOSIS — R200 Anesthesia of skin: Secondary | ICD-10-CM | POA: Diagnosis not present

## 2023-08-25 DIAGNOSIS — C73 Malignant neoplasm of thyroid gland: Secondary | ICD-10-CM | POA: Diagnosis not present

## 2023-08-25 DIAGNOSIS — Z5112 Encounter for antineoplastic immunotherapy: Secondary | ICD-10-CM | POA: Diagnosis not present

## 2023-08-25 DIAGNOSIS — C9000 Multiple myeloma not having achieved remission: Secondary | ICD-10-CM | POA: Diagnosis not present

## 2023-09-08 DIAGNOSIS — Z5112 Encounter for antineoplastic immunotherapy: Secondary | ICD-10-CM | POA: Diagnosis not present

## 2023-09-08 DIAGNOSIS — Z5111 Encounter for antineoplastic chemotherapy: Secondary | ICD-10-CM | POA: Diagnosis not present

## 2023-09-08 DIAGNOSIS — T451X5A Adverse effect of antineoplastic and immunosuppressive drugs, initial encounter: Secondary | ICD-10-CM | POA: Diagnosis not present

## 2023-09-08 DIAGNOSIS — D72820 Lymphocytosis (symptomatic): Secondary | ICD-10-CM | POA: Diagnosis not present

## 2023-09-08 DIAGNOSIS — R200 Anesthesia of skin: Secondary | ICD-10-CM | POA: Diagnosis not present

## 2023-09-08 DIAGNOSIS — G894 Chronic pain syndrome: Secondary | ICD-10-CM | POA: Diagnosis not present

## 2023-09-08 DIAGNOSIS — D701 Agranulocytosis secondary to cancer chemotherapy: Secondary | ICD-10-CM | POA: Diagnosis not present

## 2023-09-08 DIAGNOSIS — C9000 Multiple myeloma not having achieved remission: Secondary | ICD-10-CM | POA: Diagnosis not present

## 2023-09-08 DIAGNOSIS — C73 Malignant neoplasm of thyroid gland: Secondary | ICD-10-CM | POA: Diagnosis not present

## 2023-09-08 DIAGNOSIS — D539 Nutritional anemia, unspecified: Secondary | ICD-10-CM | POA: Diagnosis not present

## 2023-09-15 DIAGNOSIS — Z5111 Encounter for antineoplastic chemotherapy: Secondary | ICD-10-CM | POA: Diagnosis not present

## 2023-09-15 DIAGNOSIS — C73 Malignant neoplasm of thyroid gland: Secondary | ICD-10-CM | POA: Diagnosis not present

## 2023-09-15 DIAGNOSIS — D72820 Lymphocytosis (symptomatic): Secondary | ICD-10-CM | POA: Diagnosis not present

## 2023-09-15 DIAGNOSIS — C9000 Multiple myeloma not having achieved remission: Secondary | ICD-10-CM | POA: Diagnosis not present

## 2023-09-15 DIAGNOSIS — D539 Nutritional anemia, unspecified: Secondary | ICD-10-CM | POA: Diagnosis not present

## 2023-09-15 DIAGNOSIS — T451X5A Adverse effect of antineoplastic and immunosuppressive drugs, initial encounter: Secondary | ICD-10-CM | POA: Diagnosis not present

## 2023-09-15 DIAGNOSIS — R200 Anesthesia of skin: Secondary | ICD-10-CM | POA: Diagnosis not present

## 2023-09-15 DIAGNOSIS — D701 Agranulocytosis secondary to cancer chemotherapy: Secondary | ICD-10-CM | POA: Diagnosis not present

## 2023-09-15 DIAGNOSIS — G894 Chronic pain syndrome: Secondary | ICD-10-CM | POA: Diagnosis not present

## 2023-09-15 DIAGNOSIS — Z5112 Encounter for antineoplastic immunotherapy: Secondary | ICD-10-CM | POA: Diagnosis not present

## 2023-09-22 DIAGNOSIS — R200 Anesthesia of skin: Secondary | ICD-10-CM | POA: Diagnosis not present

## 2023-09-22 DIAGNOSIS — D72820 Lymphocytosis (symptomatic): Secondary | ICD-10-CM | POA: Diagnosis not present

## 2023-09-22 DIAGNOSIS — Z5111 Encounter for antineoplastic chemotherapy: Secondary | ICD-10-CM | POA: Diagnosis not present

## 2023-09-22 DIAGNOSIS — Z5112 Encounter for antineoplastic immunotherapy: Secondary | ICD-10-CM | POA: Diagnosis not present

## 2023-09-22 DIAGNOSIS — C9000 Multiple myeloma not having achieved remission: Secondary | ICD-10-CM | POA: Diagnosis not present

## 2023-09-22 DIAGNOSIS — T451X5A Adverse effect of antineoplastic and immunosuppressive drugs, initial encounter: Secondary | ICD-10-CM | POA: Diagnosis not present

## 2023-09-22 DIAGNOSIS — C73 Malignant neoplasm of thyroid gland: Secondary | ICD-10-CM | POA: Diagnosis not present

## 2023-09-22 DIAGNOSIS — G894 Chronic pain syndrome: Secondary | ICD-10-CM | POA: Diagnosis not present

## 2023-09-22 DIAGNOSIS — D701 Agranulocytosis secondary to cancer chemotherapy: Secondary | ICD-10-CM | POA: Diagnosis not present

## 2023-09-22 DIAGNOSIS — D539 Nutritional anemia, unspecified: Secondary | ICD-10-CM | POA: Diagnosis not present

## 2023-09-30 DIAGNOSIS — T451X5A Adverse effect of antineoplastic and immunosuppressive drugs, initial encounter: Secondary | ICD-10-CM | POA: Diagnosis not present

## 2023-09-30 DIAGNOSIS — D539 Nutritional anemia, unspecified: Secondary | ICD-10-CM | POA: Diagnosis not present

## 2023-09-30 DIAGNOSIS — Z5111 Encounter for antineoplastic chemotherapy: Secondary | ICD-10-CM | POA: Diagnosis not present

## 2023-09-30 DIAGNOSIS — C73 Malignant neoplasm of thyroid gland: Secondary | ICD-10-CM | POA: Diagnosis not present

## 2023-09-30 DIAGNOSIS — C9000 Multiple myeloma not having achieved remission: Secondary | ICD-10-CM | POA: Diagnosis not present

## 2023-09-30 DIAGNOSIS — R200 Anesthesia of skin: Secondary | ICD-10-CM | POA: Diagnosis not present

## 2023-09-30 DIAGNOSIS — Z5112 Encounter for antineoplastic immunotherapy: Secondary | ICD-10-CM | POA: Diagnosis not present

## 2023-09-30 DIAGNOSIS — G894 Chronic pain syndrome: Secondary | ICD-10-CM | POA: Diagnosis not present

## 2023-09-30 DIAGNOSIS — D701 Agranulocytosis secondary to cancer chemotherapy: Secondary | ICD-10-CM | POA: Diagnosis not present

## 2023-09-30 DIAGNOSIS — D72820 Lymphocytosis (symptomatic): Secondary | ICD-10-CM | POA: Diagnosis not present

## 2023-10-04 ENCOUNTER — Other Ambulatory Visit: Payer: Self-pay | Admitting: Family Medicine

## 2023-10-07 DIAGNOSIS — Z5111 Encounter for antineoplastic chemotherapy: Secondary | ICD-10-CM | POA: Diagnosis not present

## 2023-10-07 DIAGNOSIS — Z5112 Encounter for antineoplastic immunotherapy: Secondary | ICD-10-CM | POA: Diagnosis not present

## 2023-10-07 DIAGNOSIS — C9000 Multiple myeloma not having achieved remission: Secondary | ICD-10-CM | POA: Diagnosis not present

## 2023-10-13 DIAGNOSIS — Z5112 Encounter for antineoplastic immunotherapy: Secondary | ICD-10-CM | POA: Diagnosis not present

## 2023-10-13 DIAGNOSIS — Z5111 Encounter for antineoplastic chemotherapy: Secondary | ICD-10-CM | POA: Diagnosis not present

## 2023-10-13 DIAGNOSIS — C9000 Multiple myeloma not having achieved remission: Secondary | ICD-10-CM | POA: Diagnosis not present

## 2023-10-18 DIAGNOSIS — R197 Diarrhea, unspecified: Secondary | ICD-10-CM | POA: Diagnosis not present

## 2023-10-18 DIAGNOSIS — D701 Agranulocytosis secondary to cancer chemotherapy: Secondary | ICD-10-CM | POA: Diagnosis not present

## 2023-10-18 DIAGNOSIS — T451X5A Adverse effect of antineoplastic and immunosuppressive drugs, initial encounter: Secondary | ICD-10-CM | POA: Diagnosis not present

## 2023-10-18 DIAGNOSIS — R42 Dizziness and giddiness: Secondary | ICD-10-CM | POA: Diagnosis not present

## 2023-10-20 DIAGNOSIS — Z5112 Encounter for antineoplastic immunotherapy: Secondary | ICD-10-CM | POA: Diagnosis not present

## 2023-10-20 DIAGNOSIS — D701 Agranulocytosis secondary to cancer chemotherapy: Secondary | ICD-10-CM | POA: Diagnosis not present

## 2023-10-20 DIAGNOSIS — T451X5A Adverse effect of antineoplastic and immunosuppressive drugs, initial encounter: Secondary | ICD-10-CM | POA: Diagnosis not present

## 2023-10-20 DIAGNOSIS — Z5111 Encounter for antineoplastic chemotherapy: Secondary | ICD-10-CM | POA: Diagnosis not present

## 2023-10-20 DIAGNOSIS — C9000 Multiple myeloma not having achieved remission: Secondary | ICD-10-CM | POA: Diagnosis not present

## 2023-11-03 DIAGNOSIS — Z5111 Encounter for antineoplastic chemotherapy: Secondary | ICD-10-CM | POA: Diagnosis not present

## 2023-11-03 DIAGNOSIS — Z5112 Encounter for antineoplastic immunotherapy: Secondary | ICD-10-CM | POA: Diagnosis not present

## 2023-11-03 DIAGNOSIS — C9000 Multiple myeloma not having achieved remission: Secondary | ICD-10-CM | POA: Diagnosis not present

## 2023-11-09 DIAGNOSIS — L720 Epidermal cyst: Secondary | ICD-10-CM | POA: Diagnosis not present

## 2023-11-09 DIAGNOSIS — L821 Other seborrheic keratosis: Secondary | ICD-10-CM | POA: Diagnosis not present

## 2023-11-15 DIAGNOSIS — H401132 Primary open-angle glaucoma, bilateral, moderate stage: Secondary | ICD-10-CM | POA: Diagnosis not present

## 2023-11-17 DIAGNOSIS — C73 Malignant neoplasm of thyroid gland: Secondary | ICD-10-CM | POA: Diagnosis not present

## 2023-11-17 DIAGNOSIS — G894 Chronic pain syndrome: Secondary | ICD-10-CM | POA: Diagnosis not present

## 2023-11-17 DIAGNOSIS — D649 Anemia, unspecified: Secondary | ICD-10-CM | POA: Diagnosis not present

## 2023-11-17 DIAGNOSIS — Z5112 Encounter for antineoplastic immunotherapy: Secondary | ICD-10-CM | POA: Diagnosis not present

## 2023-11-17 DIAGNOSIS — D72810 Lymphocytopenia: Secondary | ICD-10-CM | POA: Diagnosis not present

## 2023-11-17 DIAGNOSIS — K5900 Constipation, unspecified: Secondary | ICD-10-CM | POA: Diagnosis not present

## 2023-11-17 DIAGNOSIS — R200 Anesthesia of skin: Secondary | ICD-10-CM | POA: Diagnosis not present

## 2023-11-17 DIAGNOSIS — Z5111 Encounter for antineoplastic chemotherapy: Secondary | ICD-10-CM | POA: Diagnosis not present

## 2023-11-17 DIAGNOSIS — D72820 Lymphocytosis (symptomatic): Secondary | ICD-10-CM | POA: Diagnosis not present

## 2023-11-17 DIAGNOSIS — C9000 Multiple myeloma not having achieved remission: Secondary | ICD-10-CM | POA: Diagnosis not present

## 2023-12-01 DIAGNOSIS — K5900 Constipation, unspecified: Secondary | ICD-10-CM | POA: Diagnosis not present

## 2023-12-01 DIAGNOSIS — C73 Malignant neoplasm of thyroid gland: Secondary | ICD-10-CM | POA: Diagnosis not present

## 2023-12-01 DIAGNOSIS — Z5112 Encounter for antineoplastic immunotherapy: Secondary | ICD-10-CM | POA: Diagnosis not present

## 2023-12-01 DIAGNOSIS — C9000 Multiple myeloma not having achieved remission: Secondary | ICD-10-CM | POA: Diagnosis not present

## 2023-12-01 DIAGNOSIS — D72810 Lymphocytopenia: Secondary | ICD-10-CM | POA: Diagnosis not present

## 2023-12-01 DIAGNOSIS — D649 Anemia, unspecified: Secondary | ICD-10-CM | POA: Diagnosis not present

## 2023-12-01 DIAGNOSIS — Z5111 Encounter for antineoplastic chemotherapy: Secondary | ICD-10-CM | POA: Diagnosis not present

## 2023-12-01 DIAGNOSIS — D72820 Lymphocytosis (symptomatic): Secondary | ICD-10-CM | POA: Diagnosis not present

## 2023-12-01 DIAGNOSIS — G894 Chronic pain syndrome: Secondary | ICD-10-CM | POA: Diagnosis not present

## 2023-12-01 DIAGNOSIS — R200 Anesthesia of skin: Secondary | ICD-10-CM | POA: Diagnosis not present

## 2023-12-03 ENCOUNTER — Other Ambulatory Visit: Payer: Self-pay

## 2023-12-15 DIAGNOSIS — D72820 Lymphocytosis (symptomatic): Secondary | ICD-10-CM | POA: Diagnosis not present

## 2023-12-15 DIAGNOSIS — C73 Malignant neoplasm of thyroid gland: Secondary | ICD-10-CM | POA: Diagnosis not present

## 2023-12-15 DIAGNOSIS — K5900 Constipation, unspecified: Secondary | ICD-10-CM | POA: Diagnosis not present

## 2023-12-15 DIAGNOSIS — R200 Anesthesia of skin: Secondary | ICD-10-CM | POA: Diagnosis not present

## 2023-12-15 DIAGNOSIS — G894 Chronic pain syndrome: Secondary | ICD-10-CM | POA: Diagnosis not present

## 2023-12-15 DIAGNOSIS — C9000 Multiple myeloma not having achieved remission: Secondary | ICD-10-CM | POA: Diagnosis not present

## 2023-12-28 ENCOUNTER — Other Ambulatory Visit: Payer: Self-pay | Admitting: Family Medicine

## 2024-01-12 DIAGNOSIS — D72820 Lymphocytosis (symptomatic): Secondary | ICD-10-CM | POA: Diagnosis not present

## 2024-01-12 DIAGNOSIS — D701 Agranulocytosis secondary to cancer chemotherapy: Secondary | ICD-10-CM | POA: Diagnosis not present

## 2024-01-12 DIAGNOSIS — R200 Anesthesia of skin: Secondary | ICD-10-CM | POA: Diagnosis not present

## 2024-01-12 DIAGNOSIS — C9000 Multiple myeloma not having achieved remission: Secondary | ICD-10-CM | POA: Diagnosis not present

## 2024-01-12 DIAGNOSIS — E538 Deficiency of other specified B group vitamins: Secondary | ICD-10-CM | POA: Diagnosis not present

## 2024-01-12 DIAGNOSIS — D539 Nutritional anemia, unspecified: Secondary | ICD-10-CM | POA: Diagnosis not present

## 2024-01-12 DIAGNOSIS — D72810 Lymphocytopenia: Secondary | ICD-10-CM | POA: Diagnosis not present

## 2024-01-12 DIAGNOSIS — T451X5A Adverse effect of antineoplastic and immunosuppressive drugs, initial encounter: Secondary | ICD-10-CM | POA: Diagnosis not present

## 2024-01-12 DIAGNOSIS — Z5111 Encounter for antineoplastic chemotherapy: Secondary | ICD-10-CM | POA: Diagnosis not present

## 2024-01-12 DIAGNOSIS — G894 Chronic pain syndrome: Secondary | ICD-10-CM | POA: Diagnosis not present

## 2024-01-19 DIAGNOSIS — R200 Anesthesia of skin: Secondary | ICD-10-CM | POA: Diagnosis not present

## 2024-01-19 DIAGNOSIS — D539 Nutritional anemia, unspecified: Secondary | ICD-10-CM | POA: Diagnosis not present

## 2024-01-19 DIAGNOSIS — T451X5A Adverse effect of antineoplastic and immunosuppressive drugs, initial encounter: Secondary | ICD-10-CM | POA: Diagnosis not present

## 2024-01-19 DIAGNOSIS — C9000 Multiple myeloma not having achieved remission: Secondary | ICD-10-CM | POA: Diagnosis not present

## 2024-01-19 DIAGNOSIS — K5900 Constipation, unspecified: Secondary | ICD-10-CM | POA: Diagnosis not present

## 2024-01-19 DIAGNOSIS — G894 Chronic pain syndrome: Secondary | ICD-10-CM | POA: Diagnosis not present

## 2024-01-19 DIAGNOSIS — D72810 Lymphocytopenia: Secondary | ICD-10-CM | POA: Diagnosis not present

## 2024-01-19 DIAGNOSIS — D72820 Lymphocytosis (symptomatic): Secondary | ICD-10-CM | POA: Diagnosis not present

## 2024-01-19 DIAGNOSIS — E538 Deficiency of other specified B group vitamins: Secondary | ICD-10-CM | POA: Diagnosis not present

## 2024-01-26 ENCOUNTER — Ambulatory Visit: Payer: Medicare HMO | Admitting: Internal Medicine

## 2024-01-27 ENCOUNTER — Ambulatory Visit (INDEPENDENT_AMBULATORY_CARE_PROVIDER_SITE_OTHER): Payer: Medicare HMO | Admitting: Internal Medicine

## 2024-01-27 ENCOUNTER — Encounter: Payer: Self-pay | Admitting: Internal Medicine

## 2024-01-27 VITALS — BP 142/70 | Ht 60.0 in | Wt 109.2 lb

## 2024-01-27 DIAGNOSIS — R7303 Prediabetes: Secondary | ICD-10-CM

## 2024-01-27 DIAGNOSIS — E890 Postprocedural hypothyroidism: Secondary | ICD-10-CM | POA: Diagnosis not present

## 2024-01-27 DIAGNOSIS — C9000 Multiple myeloma not having achieved remission: Secondary | ICD-10-CM | POA: Diagnosis not present

## 2024-01-27 DIAGNOSIS — C73 Malignant neoplasm of thyroid gland: Secondary | ICD-10-CM

## 2024-01-27 NOTE — Progress Notes (Signed)
 Patient ID: Megan Olson, female   DOB: Mar 02, 1941, 83 y.o.   MRN: [id number]   HPI  Megan Olson is an 83 y.o.-year-old very nice female, initially referred by her PCP, Dr. Arlene Ben, returning for follow-up for thyroid  cancer, postsurgical hypothyroidism, and prediabetes.  Last visit 1 year ago.  Interim history: She has multiple myeloma and is on chemotherapy and immunotherapy (immunotherapy) every 4 weeks.  Also on Zarxio. Now transferred to [facility].  As of now she is considered to be in  "unconfirmed remission".  She has a PET scan coming up.   She has chronic back pain - on Fentanyl  patch. Also prn Oxycodone . No increased urination, nausea, chest pain. She has some blurry vision - dry eyes, glaucoma. On eye drops.  Papillary thyroid  cancer:  Reviewed history: Patient was noticed to have a FDG-avid thyroid  nodule on recent PET/CT scans obtained for patient's multiple myeloma.  A thyroid  ultrasound that showed 2 nodules meeting criteria for biopsy.  We biopsied both and they showed papillary thyroid  cancer.  She had total thyroidectomy by Dr. Sofia Dunn.  PET/CT (07/17/2019): CHEST: Persistent hypermetabolism in a partially calcified 1.8 cm left thyroid  nodule, SUV max 4.5. No hypermetabolic mediastinal, hilar or axillary lymph nodes.  PET/CT (09/14/2019): HEAD/NECK: Calcified 1.5 cm in diameter left thyroid  nodule maximum SUV 6.7, previously 7.7.  Thyroid  U/S (06/24/2020): Parenchymal Echotexture: Mildly heterogenous Isthmus: Normal in size measuring 0.3 cm in diameter Right lobe: Atrophic in size measuring 3.7 x 1.3 x 1.5 cm Left lobe: Atrophic in size measuring 3.4 x 1.6 x 1.8 cm _________________________________________________________   Estimated total number of nodules >/= 1 cm: 2   Number of spongiform nodules >/=  2 cm not described below (TR1): 0   Number of mixed cystic and solid nodules >/= 1.5 cm not described below (TR2): 0   _________________________________________________________   Scattered punctate (sub 5 mm) anechoic cysts and hypoechoic nodules within the right lobe of the thyroid , several which contain internal echogenic foci with ring down artifact compatible with benign colloid. None of these punctate nodules meet imaging criteria to recommend percutaneous sampling or continued dedicated follow-up.  ________________________________________________________   Nodule # 1: Location: Left; Mid - this nodule appears hypermetabolic on preceding PET-CT Maximum size: 2.0 cm; Other 2 dimensions: 1.8 x 1.7 cm Composition: cannot determine (2) Echogenicity: cannot determine (1) Shape: not taller-than-wide (0) Margins: lobulated/irregular (2) Echogenic foci: peripheral calcifications (2) Additional echogenic foci 1:  macrocalcifications (1)   **Given size (>/= 1.0 cm) and appearance, fine needle aspiration of this highly suspicious nodule should be considered based on TI-RADS criteria.  _________________________________________________________   Nodule # 2: Location: Left; mid - this nodule appears hypermetabolic on preceding PET-CT Maximum size: 1.2 cm; Other 2 dimensions: 0.9 x 0.7 cm Composition: solid/almost completely solid (2) Echogenicity: hypoechoic (2) Shape: not taller-than-wide (0) Margins: lobulated/irregular (2) Echogenic foci: none (0) *Given size (>/= 1 - 1.4 cm) and appearance, a follow-up ultrasound in 1 year should be considered based on TI-RADS criteria. However - see below: Bx recommended.  _________________________________________________________   There is an additional punctate (approximately 0.5 cm) anechoic cyst within left lobe of the thyroid  which does not meet criteria to recommend percutaneous sampling or continued dedicated follow-up.   IMPRESSION: 1. Findings suggestive of multinodular goiter. 2. Nodule #1,correlating with one of hypermetabolic nodules seen  on preceding PET-CT, meets imaging criteria to recommend percutaneous sampling as clinically indicated. 3. Nodule #2 meets TIRADS criteria to only recommend a 1 year  follow-up, though as this nodule also appears hypermetabolic on preceding PET-CT, percutaneous sampling is also recommended for this nodule as hypermetabolic nodules have a higher rate of harboring malignancy. 4. None of the remaining thyroid  nodules/cysts meet imaging criteria to recommend percutaneous sampling or continued dedicated follow-up.  FNAs (07/10/2020): Specimen Submitted:  A. THYROID ,LEFT MID #2, FINE NEEDLE ASPIRATION:  FINAL MICROSCOPIC DIAGNOSIS:  - Findings consistent with papillary carcinoma (Bethesda category VI)  SPECIMEN ADEQUACY:  Satisfactory for evaluation   Specimen Submitted:  A. THYROID ,LEFT MID #1, FINE NEEDLE ASPIRATION:  FINAL MICROSCOPIC DIAGNOSIS:  - Findings consistent with papillary carcinoma (Bethesda category VI)  SPECIMEN ADEQUACY:  Satisfactory for evaluation  Total thyroidectomy (08/02/2020): A. THYROID , TOTAL THYROIDECTOMY:  - Papillary thyroid  carcinoma, classical variant, at least 3 cm  - Portion of the tumor shows hyaline fibrosis and calcification  - Anterior surface resection margin is involved by tumor  - Definite evidence of extrathyroidal extension is not identified  - Negative for lymphovascular invasion   THYROID  GLAND:   Procedure: Total thyroidectomy  Tumor Focality: Unifocal  Tumor Site: Mid and inferior pole of left lobe  Tumor Size: At least 3 cm  Histologic Type: Papillary thyroid  carcinoma, classical variant  Margins: The anterior surface margin is involved by carcinoma  Angioinvasion: Not identified  Lymphatic Invasion: Not identified  Extrathyroidal extension: Not identified  Regional Lymph Nodes: No lymph nodes submitted or found  Pathologic Stage Classification (pTNM, AJCC 8th Edition): pT2, pN not  assigned (no nodes submitted or found)  Comment(s):  Though 2 separate lesions were described grossly, they  appeared to be part of a single tumor with prominent fibrosis and calcification of a portion of the tumor.    Neck U/S (02/24/2022): The thyroid  gland is surgically absent. No abnormal soft tissue identified in the surgical bed. No pathologically enlarged lymph nodes identified in the adjacent soft tissues of the neck.   IMPRESSION: Status post total thyroidectomy. No findings of locally recurrent disease.  Lab Results  Component Value Date   THYROGLB 0.3 (L) 01/26/2023   THYROGLB 1.0 (L) 01/28/2022   THYROGLB 1.0 (L) 06/30/2021   THYROGLB 0.4 (L) 09/05/2020   Lab Results  Component Value Date   THGAB <1 01/26/2023   THGAB <1 01/28/2022   THGAB <1 06/30/2021   THGAB <1 09/05/2020   Pt denies: - feeling nodules in neck - hoarseness - dysphagia - choking  She is on levothyroxine  88 mcg daily: - in am (5:30 am) - fasting - at least 1h  from b'fast - off calcium  - no iron - + multivitamins at lunchtime - no PPIs - not on Biotin - + slow Mag with calcium  at dinner She is now off Dexamethasone .  Reviewed her TFTs: Lab Results  Component Value Date   TSH 0.69 01/26/2023   TSH 1.50 01/28/2022   TSH 2.74 08/11/2021   TSH 7.50 (H) 06/30/2021   TSH 3.17 03/17/2021   TSH 1.96 10/30/2020   TSH 0.56 09/05/2020   TSH 1.006 09/19/2019   TSH 1.84 03/25/2016   TSH 1.47 03/22/2015   FREET4 1.09 01/26/2023   FREET4 0.95 01/28/2022   FREET4 0.77 06/30/2021   FREET4 0.86 03/17/2021   FREET4 0.90 10/30/2020   FREET4 1.04 09/05/2020   FREET4 1.10 09/19/2019    She has family history of thyroid  disease: Hashimoto's thyroidism in sister. No FH of thyroid  cancer. No h/o radiation tx to head or neck. No herbal supplements. No Biotin use.   Prediabetes:  Reviewed  HbA1c levels: Lab Results  Component Value Date   HGBA1C 6.3 01/26/2023   HGBA1C 5.0 01/28/2022   HGBA1C 5.9 (A) 01/13/2021   HGBA1C 5.9 (A) 06/18/2020    HGBA1C 5.8 (H) 01/26/2020   HGBA1C 5.6 10/10/2018   HGBA1C 6.3 04/04/2018   HGBA1C 5.9 09/17/2017   HGBA1C 6.2 03/26/2017   HGBA1C 5.9 03/22/2015   She does not check blood sugars at home.  + HL: Lab Results  Component Value Date   CHOL 220 (H) 01/26/2020   HDL 68 01/26/2020   LDLCALC 132 (H) 01/26/2020   TRIG 97 01/26/2020   CHOLHDL 3.2 01/26/2020  She is not on a statin.  Review most recent kidney function: Lab Results  Component Value Date   BUN 14 05/18/2023   Lab Results  Component Value Date   CREATININE 0.63 05/18/2023  She is not on ACE inhibitor or ARB.  She takes an aspirin 81 mg daily.  She has frequent [facility] exams at [facility], to follow her glaucoma: No DR  She has a history of multiple myeloma. Also, history of multilevel thoracic vertebral fractures.  ROS: + see HPI  I reviewed pt's medications, allergies, PMH, social hx, family hx, and changes were documented in the history of present illness. Otherwise, unchanged from my initial visit note.  Past Medical History:  Diagnosis Date   Anemia    Glaucoma    HYPERTENSION 03/11/2007   Multiple myeloma (HCC)    OSTEOPENIA 03/11/2007   Pre-diabetes    Thyroid  cancer (HCC)    Thyroid  disease    Tubular adenoma of colon 07/2015   Past Surgical History:  Procedure Laterality Date   BONE MARROW BIOPSY     multiple   BREAST EXCISIONAL BIOPSY Right 2000   BREAST LUMPECTOMY  1990   benign   CATARACT EXTRACTION Bilateral 2018   DILATION AND CURETTAGE OF UTERUS     bleeding at menopause. No uterine cancer   IR IMAGING GUIDED PORT INSERTION  05/16/2021   IR RADIOLOGIST EVAL & MGMT  12/13/2018   THYROIDECTOMY N/A 08/02/2020   Procedure: TOTAL THYROIDECTOMY;  Surgeon: Oralee Billow, MD;  Location: [facility];  Service: General;  Laterality: N/A;   TONSILLECTOMY     age 4   Social History   Socioeconomic History   Marital status: Married    Spouse name: Not on file   Number of children: 1   Years of  education: Not on file   Highest education level: Not on file  Occupational History   Occupation: Retired  Tobacco Use   Smoking status: Former    Current packs/day: 0.00    Average packs/day: 0.5 packs/day for 7.0 years (3.5 ttl pk-yrs)    Types: Cigarettes    Start date: 01/04/1954    Quit date: 01/04/1961    Years since quitting: 63.1   Smokeless tobacco: Never  Vaping Use   Vaping status: Never Used  Substance and Sexual Activity   Alcohol use: Not Currently    Alcohol/week: 1.0 standard drink of alcohol    Types: 1 Standard drinks or equivalent per week    Comment: occas    Drug use: No   Sexual activity: Not Currently  Other Topics Concern   Not on file  Social History Narrative   Married. Lives with husband (patient of Dr. Arlene Ben). 1 son. No grandkids. 1 granddog.       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, swing dancing, dinner, read      Social Drivers of Health   Financial Resource Strain: Low Risk  (02/15/2023)   Overall Financial Resource Strain (CARDIA)    Difficulty of Paying Living Expenses: Not hard at all  Food Insecurity: Low Risk  (07/22/2023)   Received from [facility]   Hunger Vital Sign    Worried About Running Out of Food in the Last Year: Never true    Ran Out of Food in the Last Year: Never true  Transportation Needs: No Transportation Needs (07/19/2023)   Received from [facility]    In the past 12 months, has lack of reliable transportation kept you from medical appointments, meetings, work or from getting things needed for daily living? : No  Physical Activity: Inactive (02/15/2023)   Exercise Vital Sign    Days of Exercise per Week: 0 days    Minutes of Exercise per Session: 0 min  Stress: No Stress Concern Present (02/15/2023)   [employer] of Occupational Health - Occupational Stress Questionnaire    Feeling of Stress : Not at  all  Social Connections: Moderately Integrated (02/15/2023)   Social Connection and Isolation Panel [NHANES]    Frequency of Communication with Friends and Family: More than three times a week    Frequency of Social Gatherings with Friends and Family: More than three times a week    Attends Religious Services: More than 4 times per year    Active Member of [employer] or Organizations: No    Attends Banker Meetings: Never    Marital Status: Married  Catering manager Violence: Not At Risk (02/15/2023)   Humiliation, Afraid, Rape, and Kick questionnaire    Fear of Current or Ex-Partner: No    Emotionally Abused: No    Physically Abused: No    Sexually Abused: No   Current Outpatient Medications on File Prior to Visit  Medication Sig Dispense Refill   acyclovir  (ZOVIRAX ) 400 MG tablet Take 1 tablet (400 mg total) by mouth 2 (two) times daily. 60 tablet 3   ALPHAGAN  P 0.1 % SOLN Place 1 drop into both eyes in the morning, at noon, and at bedtime.      amLODipine  (NORVASC ) 10 MG tablet Take 1 tablet by mouth once daily 90 tablet 0   aspirin EC 81 MG tablet Take 81 mg by mouth daily.     Cholecalciferol (VITAMIN D3) 125 MCG (5000 UT) TABS Take 5,000 Units by mouth daily.     Co-Enzyme Q-10 100 MG CAPS Take 100 mg by mouth daily.     dexamethasone  (DECADRON ) 4 MG tablet Take 4 tablets (16 mg total) by mouth as directed. Take one hour prior to monthly Darzalex  infusion. 12 tablet 3   dorzolamide -timolol  (COSOPT ) 22.3-6.8 MG/ML ophthalmic solution Place 1 drop into both eyes 2 (two) times daily.   11   fentaNYL  (DURAGESIC ) 12 MCG/HR Place 1 patch onto the skin every 3 (three) days. 10 patch 0   levothyroxine  (SYNTHROID ) 88 MCG tablet Take 1 tablet (88 mcg total) by mouth daily before breakfast. 90 tablet 3   lidocaine -prilocaine  (EMLA ) cream Apply to affected area once 30 g 3   magnesium  chloride (SLOW-MAG) 64 MG TBEC SR tablet Take by mouth.     Multiple Vitamins-Minerals (MULTIVITAMIN  WITH MINERALS) tablet Take 1 tablet by mouth daily. Centrum Silver 50 +     Netarsudil-Latanoprost  (ROCKLATAN) 0.02-0.005 % SOLN Place 1 drop  into both eyes daily.     Omega-3 1000 MG CAPS Take 1,000 mg by mouth daily.      ondansetron  (ZOFRAN ) 8 MG tablet Take 1 tablet (8 mg total) by mouth every 8 (eight) hours as needed for nausea or vomiting. 30 tablet 1   oxyCODONE -acetaminophen  (PERCOCET) 5-325 MG tablet Take 1-2 tablets by mouth every 4 (four) hours as needed for moderate pain or severe pain. 90 tablet 0   polyethylene glycol (MIRALAX ) packet Take 17 g by mouth daily. 30 each 1   pomalidomide  (POMALYST ) 3 MG capsule Take 1 capsule (3 mg total) by mouth daily. Take for 21 days on, 7 days off, repeat every 28 days 21 capsule 1   prochlorperazine  (COMPAZINE ) 10 MG tablet Take 1 tablet (10 mg total) by mouth every 6 (six) hours as needed for nausea or vomiting. 30 tablet 1   senna-docusate (EQ STOOL SOFTENER/LAXATIVE) 8.6-50 MG tablet Take 2 tablets by mouth at bedtime. 60 tablet 2   Simethicone  (GAS-X PO) Take 1 tablet by mouth as needed.     vitamin C (ASCORBIC ACID) 500 MG tablet Take 500 mg by mouth 2 (two) times a week.      No current facility-administered medications on file prior to visit.   Allergies  Allergen Reactions   Ace Inhibitors Cough   Benadryl [Diphenhydramine] Other (See Comments)    Heart races   Diamox [Acetazolamide]     Hypotensive event at ophthalmologist   Sulfamethoxazole     REACTION: rash   Lenalidomide  Rash   Penicillins Rash    REACTION: rash Did it involve swelling of the face/tongue/throat, SOB, or low BP? Yes Did it involve sudden or severe rash/hives, skin peeling, or any reaction on the inside of your mouth or nose? No Did you need to seek medical attention at a hospital or doctor's office? No When did it last happen?      more than 10 years ago If all above answers are "NO", may proceed with cephalosporin use.    Family History  Problem  Relation Age of Onset   Heart disease Mother        CHF mother died 62   Arthritis Mother    Glaucoma Mother        sister as well   Alcohol abuse Father    Suicidality Father    Heart disease Sister        aortic valve replacement   Lung cancer Sister        smoker   Hyperlipidemia Brother    Hypertension Brother    COPD Brother    Colon polyps Brother    Arthritis Sister    Hypertension Sister    Glaucoma Sister    Hashimoto's thyroiditis Sister    Colon polyps Sister    Hypertension Son    Stroke Maternal Grandmother    Cystic fibrosis Niece    Colon cancer Neg Hx    PE: BP (!) 142/70   Ht 5' (1.524 m)   Wt 109 lb 3.2 oz (49.5 kg)   BMI 21.33 kg/m  Wt Readings from Last 3 Encounters:  01/27/24 109 lb 3.2 oz (49.5 kg)  05/18/23 105 lb 1.6 oz (47.7 kg)  05/05/23 106 lb 6.4 oz (48.3 kg)   Constitutional: Thin, in NAD Eyes:  EOMI, no exophthalmos ENT: no neck masses, thyroidectomy scar healed, no cervical lymphadenopathy Cardiovascular: RRR, No MRG Respiratory: CTA B Musculoskeletal: no deformities Skin:no rashes Neurological: no tremor with outstretched hands  ASSESSMENT: 1.  Papillary thyroid  cancer, classic variant  2.  Postsurgical hypothyroidism  3.  Prediabetes  PLAN: 1. Thyroid  cancer - papillary -Patient with history of clinically stage I TNM thyroid  cancer, with lower clinical risk due to tumor size smaller than 4 cm, no lymphovascular invasion, no atypical features, except for few calcifications and hyalinization, possibly associated with slightly higher risk, and without extrathyroidal extension.  -We discussed that PTC is a cancer with good prognosis, and her life expectancy and quality of life were unlikely to be affected by the cancer -Since her cancer was low-intermediate risk, RAI treatment for postop thyroid  remnant ablation was not mandatory.  We decided to follow her without RAI treatment, especially as she was on the treatment plan for  multiple myeloma.  We did discuss that if we decide to perform RAI treatment later in the disease, this will likely not affect her prognosis. - We did discuss that since she did not have RAI treatment, her thyroglobulin levels may not become undetectable.  At the visit, thyroglobulin level was 0.3, lower than before, and ATA antibodies were undetectable. - PET scan from 12/04/2021 did not show any increased uptake in neck - In 2023, we checked another neck ultrasound, first after surgery.  There were no signs of recurrence or abnormal neck masses. - Will recheck her thyroglobulin and ATA antibodies today -I will see the patient in 1 year  2.  Patient with history of total thyroidectomy for thyroid  cancer, now with iatrogenic hypothyroidism, on levothyroxine  therapy - latest thyroid  labs reviewed with pt. >> normal: Lab Results  Component Value Date   TSH 0.69 01/26/2023  - she continues on LT4 88 mcg daily - pt feels good on this dose. - we discussed about taking the thyroid  hormone every day, with water, >30 minutes before breakfast, separated by >4 hours from acid reflux medications, calcium , iron, multivitamins. Pt. is taking it correctly. - will check thyroid  tests today: TSH and fT4 - If labs are abnormal, she will need to return for repeat TFTs in 1.5 months  2.  Prediabetes - At last visit, HbA1c was higher: Lab Results  Component Value Date   HGBA1C 6.3 01/26/2023   HGBA1C 5.0 01/28/2022   HGBA1C 5.9 (A) 01/13/2021  - Will repeat her HbA1c - Will continue without medications for now  Needs refills LT4.  Orders Placed This Encounter  Procedures   TSH   T4, free   Hemoglobin A1c   Thyroglobulin Level   Thyroglobulin antibody   Emilie Harden, MD PhD [facility] Endocrinology

## 2024-01-27 NOTE — Patient Instructions (Signed)
Please continue: - Levothyroxine 88 mcg daily.  Take the thyroid hormone every day, with water, at least 30 minutes before breakfast, separated by at least 4 hours from: - acid reflux medications - calcium - iron - multivitamins  Please stop at the lab.  Please return in 1 year.

## 2024-01-28 ENCOUNTER — Other Ambulatory Visit: Payer: Self-pay

## 2024-01-28 ENCOUNTER — Encounter: Payer: Self-pay | Admitting: Internal Medicine

## 2024-01-28 LAB — HEMOGLOBIN A1C: Hgb A1c MFr Bld: <4.2 % (ref <5.7)

## 2024-01-28 LAB — THYROGLOBULIN LEVEL: Thyroglobulin: 0.3 ng/mL — ABNORMAL LOW

## 2024-01-28 LAB — TSH: TSH: 0.52 m[IU]/L (ref 0.40–4.50)

## 2024-01-28 LAB — THYROGLOBULIN ANTIBODY: Thyroglobulin Ab: <1 [IU]/mL (ref <=1)

## 2024-01-28 LAB — T4, FREE: Free T4: 1.3 ng/dL (ref 0.8–1.8)

## 2024-01-28 MED ORDER — LEVOTHYROXINE SODIUM 88 MCG PO TABS: 88.0000 ug | ORAL_TABLET | Freq: Every day | ORAL | 3 refills | Status: AC

## 2024-01-28 NOTE — Addendum Note (Signed)
 Addended by: Emilie Harden on: 01/28/2024 04:27 PM   Modules accepted: Orders

## 2024-02-09 ENCOUNTER — Encounter: Payer: Medicare HMO | Admitting: Family Medicine

## 2024-02-10 ENCOUNTER — Telehealth: Payer: Self-pay

## 2024-02-14 ENCOUNTER — Encounter (HOSPITAL_COMMUNITY): Payer: Self-pay

## 2024-02-14 DIAGNOSIS — H401132 Primary open-angle glaucoma, bilateral, moderate stage: Secondary | ICD-10-CM | POA: Diagnosis not present

## 2024-02-16 ENCOUNTER — Other Ambulatory Visit (HOSPITAL_COMMUNITY): Payer: Self-pay

## 2024-02-16 DIAGNOSIS — G894 Chronic pain syndrome: Secondary | ICD-10-CM | POA: Diagnosis not present

## 2024-02-16 DIAGNOSIS — K5900 Constipation, unspecified: Secondary | ICD-10-CM | POA: Diagnosis not present

## 2024-02-16 DIAGNOSIS — D539 Nutritional anemia, unspecified: Secondary | ICD-10-CM | POA: Diagnosis not present

## 2024-02-16 DIAGNOSIS — C73 Malignant neoplasm of thyroid gland: Secondary | ICD-10-CM | POA: Diagnosis not present

## 2024-02-16 DIAGNOSIS — D72820 Lymphocytosis (symptomatic): Secondary | ICD-10-CM | POA: Diagnosis not present

## 2024-02-16 DIAGNOSIS — T451X5A Adverse effect of antineoplastic and immunosuppressive drugs, initial encounter: Secondary | ICD-10-CM | POA: Diagnosis not present

## 2024-02-16 DIAGNOSIS — C9000 Multiple myeloma not having achieved remission: Secondary | ICD-10-CM | POA: Diagnosis not present

## 2024-02-16 DIAGNOSIS — R200 Anesthesia of skin: Secondary | ICD-10-CM | POA: Diagnosis not present

## 2024-02-17 NOTE — Telephone Encounter (Addendum)
 Hematology Clinic Pharmacist Note   BiTE agent: Tecvayli for IgG [facility] received step-up dosing: [name] Date of initiation: 07/15/2023 Supportive care medications: acyclovir  and dapsone (G6PD testing at [facility] due to allergy to Bactrim)  Date of Day 1: 07/14/24 Step up dose 1: 0.06 mg/kg  Dose administered: 2.8 CRS grade:0 CRS medications administered: none ICANS grade: 0 ICANS medications administered: none  Date of Day 4: 07/18/2024 Step up dose 2: 0.3 mg/kg  Dose administered: 14mg  CRS grade: 0 CRS medications administered: none ICANS grade: 0 ICANS medications administered: none  Date of Day 7: 07/21/2024 First treatment dose: 1.5 mg/kg  Dose administered: 70mg  CRS grade: 0 CRS medications administered: none ICANS grade: 0 ICANS medications administered: none  Patient's hematologist will be Dr. Salomon Cree and will see MD with labs for bi-weekly dosing and will have the first dose at [name] on 03/15/2024. Patient has been on therapy and is currently on every other week medication. Patient sees MD/PA at [name] once every other treatment. We will continue this schedule so patient can continue with current lifestyle.   Information from [name]  Patient does get labs prior to each injection and sees MD or PA once every other administration.  Patient has not had infection issues so patient has not received IVIG lately even though patient has IgG MM.  Patient is administering at home injections of Zarxio (filgrastim 300mg ) recently switched to Monday and Thursday each week. She will need new prescription for outpatient administration of Zarxio once patient returns to [name]. Patient had [employer] grant for coverage until 12/13/24.  Megan Olson, PharmD Hematology/Oncology Clinical Pharmacist [phone]

## 2024-02-21 ENCOUNTER — Ambulatory Visit: Payer: Medicare HMO

## 2024-02-21 VITALS — Ht 60.0 in | Wt 109.0 lb

## 2024-02-21 DIAGNOSIS — Z0000 Encounter for general adult medical examination without abnormal findings: Secondary | ICD-10-CM | POA: Diagnosis not present

## 2024-02-21 NOTE — Patient Instructions (Signed)
 Megan Olson , Thank you for taking time out of your busy schedule to complete your Annual Wellness Visit with me. I enjoyed our conversation and look forward to speaking with you again next year. I, as well as your care team,  appreciate your ongoing commitment to your health goals. Please review the following plan we discussed and let me know if I can assist you in the future. Your Game plan/ To Do List    Referrals: If you haven't heard from the office you've been referred to, please reach out to them at the phone provided.   Follow up Visits: Next Medicare AWV with our clinical staff: 02/26/25   Have you seen your provider in the last 6 months (3 months if uncontrolled diabetes)? No Next Office Visit with your provider: (06/12/24  Clinician Recommendations:  Aim for 30 minutes of exercise or brisk walking, 6-8 glasses of water, and 5 servings of fruits and vegetables each day.       This is a list of the screening recommended for you and due dates:  Health Maintenance  Topic Date Due   COVID-19 Vaccine (8 - 2024-25 season) 08/08/2023   Flu Shot  05/05/2024   Medicare Annual Wellness Visit  02/20/2025   DTaP/Tdap/Td vaccine (3 - Td or Tdap) 04/22/2033   Pneumonia Vaccine  Completed   DEXA scan (bone density measurement)  Completed   Zoster (Shingles) Vaccine  Completed   HPV Vaccine  Aged Out   Meningitis B Vaccine  Aged Out   Colon Cancer Screening  Discontinued    Advanced directives: (Copy Requested) Please bring a copy of your health care power of attorney and living will to the office to be added to your chart at your convenience. You can mail to [facility] [address] [place], [place] [address] or email to [email or web address] Advance Care Planning is important because it:  [x]  Makes sure you receive the medical care that is consistent with your values, goals, and preferences  [x]  It provides guidance to your family and loved ones and reduces their  decisional burden about whether or not they are making the right decisions based on your wishes.  Follow the link provided in your after visit summary or read over the paperwork we have mailed to you to help you started getting your Advance Directives in place. If you need assistance in completing these, please reach out to us  so that we can help you!  See attachments for Preventive Care and Fall Prevention Tips.

## 2024-02-21 NOTE — Progress Notes (Addendum)
 Subjective:   Megan Olson is a 83 y.o. who presents for a Medicare Wellness preventive visit.  As a reminder, Annual Wellness Visits don't include a physical exam, and some assessments may be limited, especially if this visit is performed virtually. We may recommend an in-person follow-up visit with your provider if needed.  Visit Complete: Virtual I connected with  Megan Olson on 02/21/24 by a audio enabled telemedicine application and verified that I am speaking with the correct person using two identifiers.  Patient Location: Home  Provider Location: Office/Clinic  I discussed the limitations of evaluation and management by telemedicine. The patient expressed understanding and agreed to proceed.  Vital Signs: Because this visit was a virtual/telehealth visit, some criteria may be missing or patient reported. Any vitals not documented were not able to be obtained and vitals that have been documented are patient reported.  VideoDeclined- This patient declined Librarian, academic. Therefore the visit was completed with audio only.  Persons Participating in Visit: Patient.  AWV Questionnaire: No: Patient Medicare AWV questionnaire was not completed prior to this visit.  Cardiac Risk Factors include: advanced age (>86men, >73 women);dyslipidemia;hypertension     Objective:     Today's Vitals   02/21/24 0801  Weight: 109 lb (49.4 kg)  Height: 5' (1.524 m)   Body mass index is 21.29 kg/m.     02/21/2024    8:14 AM 05/05/2023    9:10 AM 04/20/2023   11:58 AM 03/23/2023    1:28 PM 02/16/2023    3:54 PM 02/15/2023    8:44 AM 07/21/2022    9:17 AM  Advanced Directives  Does Patient Have a Medical Advance Directive? Yes Yes Yes Yes Yes Yes Yes  Type of Estate agent of [place];Living will Healthcare Power of [place];Living will    Healthcare Power of [place];Living will   Does patient want to make changes to medical advance  directive?  No - Patient declined No - Patient declined No - Patient declined No - Patient declined  No - Patient declined  Copy of Healthcare Power of Attorney in Chart? No - copy requested No - copy requested    No - copy requested     Current Medications (verified) Outpatient Encounter Medications as of 02/21/2024  Medication Sig   acyclovir  (ZOVIRAX ) 400 MG tablet Take 1 tablet (400 mg total) by mouth 2 (two) times daily.   ALPHAGAN  P 0.1 % SOLN Place 1 drop into both eyes in the morning, at noon, and at bedtime.    amLODipine  (NORVASC ) 10 MG tablet Take 1 tablet by mouth once daily   aspirin EC 81 MG tablet Take 81 mg by mouth daily.   Calcium  Carb-Cholecalciferol (CALCIUM  600 + D PO) Take by mouth.   Cholecalciferol (VITAMIN D3) 125 MCG (5000 UT) TABS Take 1,000 Units by mouth daily.   Co-Enzyme Q-10 100 MG CAPS Take 100 mg by mouth daily.   dapsone 100 MG tablet Take 100 mg by mouth daily.   dorzolamide -timolol  (COSOPT ) 22.3-6.8 MG/ML ophthalmic solution Place 1 drop into both eyes 2 (two) times daily.    fentaNYL  (DURAGESIC ) 12 MCG/HR Place 1 patch onto the skin every 3 (three) days.   filgrastim-sndz (ZARXIO) 300 MCG/0.5ML SOSY injection Inject 300 mcg into the skin.   gabapentin (NEURONTIN) 300 MG capsule Take 300 mg by mouth at bedtime.   levothyroxine  (SYNTHROID ) 88 MCG tablet Take 1 tablet (88 mcg total) by mouth daily before breakfast.  lidocaine -prilocaine  (EMLA ) cream Apply to affected area once   magnesium  chloride (SLOW-MAG) 64 MG TBEC SR tablet Take by mouth.   Multiple Vitamins-Minerals (MULTIVITAMIN WITH MINERALS) tablet Take 1 tablet by mouth daily. Centrum Silver 50 +   Netarsudil-Latanoprost  (ROCKLATAN) 0.02-0.005 % SOLN Place 1 drop into both eyes daily.   Omega-3 1000 MG CAPS Take 1,000 mg by mouth daily.    oxyCODONE -acetaminophen  (PERCOCET) 5-325 MG tablet Take 1-2 tablets by mouth every 4 (four) hours as needed for moderate pain or severe pain.   polyethylene  glycol (MIRALAX ) packet Take 17 g by mouth daily.   senna-docusate (EQ STOOL SOFTENER/LAXATIVE) 8.6-50 MG tablet Take 2 tablets by mouth at bedtime.   teclistamab-cqyv SQ (TECVAYLI) 153 MG/1.7ML Inject 1.5 mg/kg into the skin once.   vitamin C (ASCORBIC ACID) 500 MG tablet Take 500 mg by mouth 2 (two) times a week.    ondansetron  (ZOFRAN ) 8 MG tablet Take 1 tablet (8 mg total) by mouth every 8 (eight) hours as needed for nausea or vomiting. (Patient not taking: Reported on 02/21/2024)   prochlorperazine  (COMPAZINE ) 10 MG tablet Take 1 tablet (10 mg total) by mouth every 6 (six) hours as needed for nausea or vomiting. (Patient not taking: Reported on 02/21/2024)   Simethicone  (GAS-X PO) Take 1 tablet by mouth as needed. (Patient not taking: Reported on 02/21/2024)   [DISCONTINUED] dexamethasone  (DECADRON ) 4 MG tablet Take 4 tablets (16 mg total) by mouth as directed. Take one hour prior to monthly Darzalex  infusion.   [DISCONTINUED] pomalidomide  (POMALYST ) 3 MG capsule Take 1 capsule (3 mg total) by mouth daily. Take for 21 days on, 7 days off, repeat every 28 days   No facility-administered encounter medications on file as of 02/21/2024.    Allergies (verified) Ace inhibitors, Benadryl [diphenhydramine], Diamox [acetazolamide], Sulfamethoxazole, Lenalidomide , and Penicillins   History: Past Medical History:  Diagnosis Date   Anemia    Glaucoma    HYPERTENSION 03/11/2007   Multiple myeloma (HCC)    OSTEOPENIA 03/11/2007   Pre-diabetes    Thyroid  cancer (HCC)    Thyroid  disease    Tubular adenoma of colon 07/2015   Past Surgical History:  Procedure Laterality Date   BONE MARROW BIOPSY     multiple   BREAST EXCISIONAL BIOPSY Right 2000   BREAST LUMPECTOMY  1990   benign   CATARACT EXTRACTION Bilateral 2018   DILATION AND CURETTAGE OF UTERUS     bleeding at menopause. No uterine cancer   IR IMAGING GUIDED PORT INSERTION  05/16/2021   IR RADIOLOGIST EVAL & MGMT  12/13/2018   THYROIDECTOMY  N/A 08/02/2020   Procedure: TOTAL THYROIDECTOMY;  Surgeon: Oralee Billow, MD;  Location: [facility];  Service: General;  Laterality: N/A;   TONSILLECTOMY     age 88   Family History  Problem Relation Age of Onset   Heart disease Mother        CHF mother died 31   Arthritis Mother    Glaucoma Mother        sister as well   Alcohol abuse Father    Suicidality Father    Heart disease Sister        aortic valve replacement   Lung cancer Sister        smoker   Hyperlipidemia Brother    Hypertension Brother    COPD Brother    Colon polyps Brother    Arthritis Sister    Hypertension Sister    Glaucoma Sister  Hashimoto's thyroiditis Sister    Colon polyps Sister    Hypertension Son    Stroke Maternal Grandmother    Cystic fibrosis Niece    Colon cancer Neg Hx    Social History   Socioeconomic History   Marital status: Married    Spouse name: Not on file   Number of children: 1   Years of education: Not on file   Highest education level: Not on file  Occupational History   Occupation: Retired  Tobacco Use   Smoking status: Former    Current packs/day: 0.00    Average packs/day: 0.5 packs/day for 7.0 years (3.5 ttl pk-yrs)    Types: Cigarettes    Start date: 01/04/1954    Quit date: 01/04/1961    Years since quitting: 63.1   Smokeless tobacco: Never  Vaping Use   Vaping status: Never Used  Substance and Sexual Activity   Alcohol use: Not Currently    Alcohol/week: 1.0 standard drink of alcohol    Types: 1 Standard drinks or equivalent per week    Comment: occas    Drug use: No   Sexual activity: Not Currently  Other Topics Concern   Not on file  Social History Narrative   Married. Lives with husband (patient of Dr. Arlene Ben). 1 son. No grandkids. 1 granddog.       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, swing dancing, dinner, read      Social Drivers of Health   Financial  Resource Strain: Low Risk  (02/21/2024)   Overall Financial Resource Strain (CARDIA)    Difficulty of Paying Living Expenses: Not hard at all  Food Insecurity: No Food Insecurity (02/21/2024)   Hunger Vital Sign    Worried About Running Out of Food in the Last Year: Never true    Ran Out of Food in the Last Year: Never true  Transportation Needs: No Transportation Needs (02/21/2024)   PRAPARE - Administrator, Civil Service (Medical): No    Lack of Transportation (Non-Medical): No  Physical Activity: Sufficiently Active (02/21/2024)   Exercise Vital Sign    Days of Exercise per Week: 7 days    Minutes of Exercise per Session: 30 min  Stress: No Stress Concern Present (02/21/2024)   [employer] of Occupational Health - Occupational Stress Questionnaire    Feeling of Stress : Not at all  Social Connections: Moderately Integrated (02/21/2024)   Social Connection and Isolation Panel [NHANES]    Frequency of Communication with Friends and Family: More than three times a week    Frequency of Social Gatherings with Friends and Family: More than three times a week    Attends Religious Services: More than 4 times per year    Active Member of [employer] or Organizations: No    Attends Banker Meetings: Never    Marital Status: Married    Tobacco Counseling Counseling given: Not Answered    Clinical Intake:  Pre-visit preparation completed: Yes  Pain : No/denies pain     BMI - recorded: 21.29 Nutritional Status: BMI of 19-24  Normal Diabetes: No  Lab Results  Component Value Date   HGBA1C <4.2 01/27/2024   HGBA1C 6.3 01/26/2023   HGBA1C 5.0 01/28/2022     How often do you need to have someone help you when you read instructions, pamphlets, or other written materials from your doctor or pharmacy?: 1 -  Never  Interpreter Needed?: No  Information entered by :: Lamont Pilsner, LPN   Activities of Daily Living     02/21/2024    8:03 AM 05/05/2023     9:09 AM  In your present state of health, do you have any difficulty performing the following activities:  Hearing? 0 0  Vision? 0 0  Difficulty concentrating or making decisions? 0 0  Walking or climbing stairs? 0 0  Dressing or bathing? 0 0  Doing errands, shopping? 0   Preparing Food and eating ? N   Using the Toilet? N   In the past six months, have you accidently leaked urine? Y   Comment wears a pad   Do you have problems with loss of bowel control? N   Managing your Medications? N   Managing your Finances? N   Housekeeping or managing your Housekeeping? N     Patient Care Team: Almira Jaeger, MD as PCP - General (Family Medicine) Frankie Israel, MD as Consulting Physician (Hematology) Bond, Muriel Arm, MD as Referring Physician (Ophthalmology) Doretha Ganja, Steva Ek, MD as Consulting Physician (Hematology and Oncology) Emilie Harden, MD as Consulting Physician (Internal Medicine) Oralee Billow, MD as Consulting Physician (General Surgery) Myrle Aspen, [facility] (Inactive) as Pharmacist (Pharmacist)  Indicate any recent Medical Services you may have received from other than [facility] providers in the past year (date may be approximate).     Assessment:    This is a routine wellness examination for Megan Olson.  Hearing/Vision screen Hearing Screening - Comments:: Pt denies any hearing issues  Vision Screening - Comments:: Wears rx glasses - up to date with routine eye exams with Dr Louvenia Roys     Goals Addressed             This Visit's Progress    Patient Stated       Patient Stated       Continue on health journey        Depression Screen     02/21/2024    8:10 AM 02/15/2023    8:43 AM 02/08/2023    8:53 AM 10/28/2021    8:55 AM 01/27/2021    2:34 PM 09/18/2019   11:29 AM 09/06/2018    9:18 AM  PHQ 2/9 Scores  PHQ - 2 Score 0 0 0 0 0 0 0  PHQ- 9 Score  0 2    0    Fall Risk     02/21/2024    8:15 AM 02/15/2023    8:45 AM 02/08/2023    8:53  AM 10/28/2021    8:59 AM 08/04/2021    8:20 AM  Fall Risk   Falls in the past year? 0 0 0 0 0  Number falls in past yr: 0 0 0 0 0  Injury with Fall? 0 0 0 0 0  Risk for fall due to : No Fall Risks Impaired vision No Fall Risks Impaired vision   Follow up Falls prevention discussed Falls prevention discussed Falls prevention discussed Falls prevention discussed Falls evaluation completed    MEDICARE RISK AT HOME:  Medicare Risk at Home Any stairs in or around the home?: Yes If so, are there any without handrails?: No Home free of loose throw rugs in walkways, pet beds, electrical cords, etc?: Yes Adequate lighting in your home to reduce risk of falls?: Yes Life alert?: No Use of a cane, walker or w/c?: No Grab bars in the bathroom?: Yes Shower chair or bench in  shower?: No Elevated toilet seat or a handicapped toilet?: No  TIMED UP AND GO:  Was the test performed?  No  Cognitive Function: 6CIT completed    09/06/2018    9:41 AM  MMSE - Mini Mental State Exam  Orientation to time 5  Orientation to Place 5  Registration 3  Attention/ Calculation 5  Recall 3  Language- name 2 objects 2  Language- repeat 1  Language- follow 3 step command 3  Language- read & follow direction 1  Write a sentence 1  Copy design 1  Total score 30        02/21/2024    8:16 AM 02/15/2023    8:46 AM 10/28/2021    9:01 AM  6CIT Screen  What Year? 0 points 0 points 0 points  What month? 0 points 0 points 0 points  What time? 0 points 0 points 0 points  Count back from 20 0 points 0 points 0 points  Months in reverse 0 points 0 points 0 points  Repeat phrase 0 points 0 points 0 points  Total Score 0 points 0 points 0 points    Immunizations Immunization History  Administered Date(s) Administered   Fluad Quad(high Dose 65+) 06/13/2019, 07/03/2020, 06/17/2021, 06/25/2022   Influenza Split 07/19/2012   Influenza Whole 07/17/2008, 06/28/2009, 06/25/2010   Influenza, High Dose Seasonal PF  07/22/2016, 06/24/2017, 06/23/2018, 08/10/2022   Influenza,inj,Quad PF,6+ Mos 06/27/2013, 06/18/2014   Influenza-Unspecified 07/03/2015   PFIZER Comirnaty(Gray Top)Covid-19 Tri-Sucrose Vaccine 06/13/2023   PFIZER(Purple Top)SARS-COV-2 Vaccination 11/09/2019, 12/04/2019, 06/03/2020, 01/09/2021, 07/04/2021, 07/03/2022   PNEUMOCOCCAL CONJUGATE-20 05/21/2021   Pneumococcal Conjugate-13 03/29/2015   Pneumococcal Polysaccharide-23 04/04/2006   Respiratory Syncytial Virus Vaccine,Recomb Aduvanted(Arexvy) 08/10/2022   Td 02/10/2010   Tdap 04/23/2023   Zoster Recombinant(Shingrix ) 03/27/2020, 09/06/2020   Zoster, Live 09/26/2008    Screening Tests Health Maintenance  Topic Date Due   COVID-19 Vaccine (8 - 2024-25 season) 08/08/2023   INFLUENZA VACCINE  05/05/2024   Medicare Annual Wellness (AWV)  02/20/2025   DTaP/Tdap/Td (3 - Td or Tdap) 04/22/2033   Pneumonia Vaccine 66+ Years old  Completed   DEXA SCAN  Completed   Zoster Vaccines- Shingrix   Completed   HPV VACCINES  Aged Out   Meningococcal B Vaccine  Aged Out   Colonoscopy  Discontinued    Health Maintenance  Health Maintenance Due  Topic Date Due   COVID-19 Vaccine (8 - 2024-25 season) 08/08/2023   Health Maintenance Items Addressed: See Nurse Notes  Additional Screening:  Vision Screening: Recommended annual ophthalmology exams for early detection of glaucoma and other disorders of the eye.  Dental Screening: Recommended annual dental exams for proper oral hygiene  Community Resource Referral / Chronic Care Management: CRR required this visit?  No   CCM required this visit?  No   Plan:    I have personally reviewed and noted the following in the patient's chart:   Medical and social history Use of alcohol, tobacco or illicit drugs  Current medications and supplements including opioid prescriptions. Patient is currently taking opioid prescriptions. Information provided to patient regarding non-opioid  alternatives. Patient advised to discuss non-opioid treatment plan with their provider. Functional ability and status Nutritional status Physical activity Advanced directives List of other physicians Hospitalizations, surgeries, and ER visits in previous 12 months Vitals Screenings to include cognitive, depression, and falls Referrals and appointments  In addition, I have reviewed and discussed with patient certain preventive protocols, quality metrics, and best practice recommendations. A written personalized care  plan for preventive services as well as general preventive health recommendations were provided to patient.   Bruno Capri, LPN   1/61/0960   After Visit Summary: (MyChart) Due to this being a telephonic visit, the after visit summary with patients personalized plan was offered to patient via MyChart   Notes: routing comment

## 2024-02-22 ENCOUNTER — Other Ambulatory Visit: Payer: Self-pay

## 2024-03-06 ENCOUNTER — Other Ambulatory Visit: Payer: Self-pay | Admitting: Hematology

## 2024-03-06 ENCOUNTER — Encounter: Payer: Self-pay | Admitting: Hematology

## 2024-03-06 DIAGNOSIS — Z7189 Other specified counseling: Secondary | ICD-10-CM

## 2024-03-06 DIAGNOSIS — C9002 Multiple myeloma in relapse: Secondary | ICD-10-CM

## 2024-03-08 ENCOUNTER — Encounter: Payer: Self-pay | Admitting: Hematology

## 2024-03-14 NOTE — Progress Notes (Signed)
 [facility]   Telephone:(336) [phone] Fax:([phone]   Clinic Follow up Note   Patient Care Team: Almira Jaeger, MD as PCP - General (Family Medicine) Frankie Israel, MD as Consulting Physician (Hematology) Bond, Muriel Arm, MD as Referring Physician (Ophthalmology) Doretha Ganja, Steva Ek, MD as Consulting Physician (Hematology and Oncology) Emilie Harden, MD as Consulting Physician (Internal Medicine) Oralee Billow, MD as Consulting Physician (General Surgery) Myrle Aspen, [facility] (Inactive) as Pharmacist (Pharmacist)  Date of Service: 03/15/2024  CHIEF COMPLAINT:  Follow-up for continuation and management of multiple myeloma   SUMMARY OF ONCOLOGIC HISTORY: Oncology History  Multiple myeloma not having achieved remission (HCC)  12/12/2018 Initial Diagnosis   Multiple myeloma not having achieved remission (HCC)   12/20/2018 - 10/04/2019 Chemotherapy   The patient had dexamethasone  (DECADRON ) tablet 20 mg, 20 mg (100 % of original dose 20 mg), Oral, Once, 14 of 14 cycles Dose modification: 20 mg (original dose 20 mg, Cycle 1), 10 mg (original dose 20 mg, Cycle 14) Administration: 20 mg (12/20/2018), 20 mg (12/27/2018), 20 mg (01/10/2019), 20 mg (01/17/2019), 20 mg (01/31/2019), 20 mg (02/07/2019), 20 mg (03/14/2019), 20 mg (03/21/2019), 20 mg (02/21/2019), 20 mg (02/28/2019), 20 mg (04/04/2019), 20 mg (04/11/2019), 20 mg (04/25/2019), 20 mg (05/02/2019), 20 mg (05/16/2019), 20 mg (05/23/2019), 20 mg (06/06/2019), 20 mg (06/13/2019), 20 mg (06/27/2019), 20 mg (07/04/2019), 20 mg (07/18/2019), 20 mg (07/25/2019), 20 mg (08/08/2019), 20 mg (08/15/2019), 20 mg (08/29/2019), 20 mg (09/05/2019), 10 mg (09/19/2019) lenalidomide  (REVLIMID ) 15 MG capsule, 1 of 1 cycle, Start date: 01/18/2019, End date: 02/21/2019 bortezomib  SQ (VELCADE ) chemo injection 2 mg, 1.3 mg/m2 = 2 mg, Subcutaneous,  Once, 14 of 14 cycles Administration: 2 mg (12/20/2018), 2 mg (12/27/2018), 2 mg (12/23/2018), 2 mg  (12/30/2018), 2 mg (01/10/2019), 2 mg (01/17/2019), 2 mg (01/13/2019), 2 mg (01/20/2019), 2 mg (01/31/2019), 2 mg (02/07/2019), 2 mg (02/03/2019), 2 mg (02/10/2019), 2 mg (03/14/2019), 2 mg (03/17/2019), 2 mg (03/21/2019), 2 mg (03/24/2019), 2 mg (02/21/2019), 2 mg (02/24/2019), 2 mg (02/28/2019), 2 mg (03/03/2019), 2 mg (04/04/2019), 2 mg (04/06/2019), 2 mg (04/11/2019), 2 mg (04/14/2019), 2 mg (04/25/2019), 2 mg (04/28/2019), 2 mg (05/02/2019), 2 mg (05/05/2019), 2 mg (05/16/2019), 2 mg (05/19/2019), 2 mg (05/23/2019), 2 mg (05/26/2019), 2 mg (06/06/2019), 2 mg (06/09/2019), 2 mg (06/13/2019), 2 mg (06/16/2019), 2 mg (06/27/2019), 2 mg (06/30/2019), 2 mg (07/04/2019), 2 mg (07/07/2019), 2 mg (07/18/2019), 2 mg (07/21/2019), 2 mg (07/25/2019), 2 mg (07/28/2019), 2 mg (08/08/2019), 2 mg (08/11/2019), 2 mg (08/15/2019), 2 mg (08/18/2019), 2 mg (08/29/2019), 2 mg (09/01/2019), 2 mg (09/05/2019), 2 mg (09/08/2019), 2 mg (09/19/2019), 2 mg (10/04/2019)  for chemotherapy treatment.    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)  04/16/2021 Initial Diagnosis   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)   04/30/2021 - 04/15/2022 Chemotherapy   Patient is on Treatment Plan : MYELOMA RELAPSED/REFRACTORY KCd q28d     05/12/2022 - 07/08/2022 Chemotherapy   Patient is on Treatment Plan : MYELOMA Daratumumab  + Pomalidomide  + Dexamethasone  q28d x 7 cycles     05/22/2022 - 05/18/2023 Chemotherapy   Patient is on Treatment Plan : MYELOMA Daratumumab  IV + Pomalidomide  + Dexamethasone  q28d x 7 cycles     03/15/2024 -  Chemotherapy   Patient is on Treatment Plan : MYELOMA Maintenance Teclistamab -cqyv SQ, D1,15 q28d       CURRENT THERAPY: Dara/Pom/Dex  INTERVAL HISTORY:  KHAMYA TOPP is a 83 y.o. female returns for continued evaluation and management of  multiple myeloma. He is here to start cycle 10 day 1 of her treatment.   Patient was last seen by me on 05/18/2023 and complained of a small lump near her right collar bone and small nodule on the skull.  She received BiTE therapy with  Tecvayli  for IgG multiple myeloma, initiated on 07/15/2023. Patient will start Bi-weekly maintenance dosing today.   Patient is accompanied by her husband during today's visit.   She continues to receive growth factor, Zarzio 300 mcg, at home since April, twice a week, Mondays and Thursdays. She notes that she has refilled her growth factor twice so far.   She is taking vitamin B12. Patient is not taking vitamin B complex.   Patient complains of vision issues from glaucoma. She reports watery eyes and has managed this with artificial tears. Patient notes having some trouble reading due to tired eyes. She otherwise denies any other vision issues.   She reports normal energy levels.   She denies any new headache, confusion, SOB, chest pain, new fever, chills, night sweats, or change in bowel habits.   Patient reports hip pain once in a while, but denies any new bone pain otherwise. She reports that she takes Claritin to manage bone pain from Zarzio.   Patient notes that she was previously receiving Zometa  once a month, and currently on Zometa  once every 3 months, with next dose in July.   She reports a spot on the skin of her back along the spine which has become more bothersome. Patient notes that she was seen by her skin doctor in April but forgot to address this issue with her doctor. She reports that her dermatologist is Dr. Joanne Muckle.   Patient reports a painful ingrown toenail in her right foot which she believes is getting infected. She notes that she has never seen a podiatrist.   She notes that she is soaking her toenail in epsom salt. Patient notes that her shoes are not tight but notes that it is possible that the she could be rubbing the inflamed area of her nail against the shoe.   Patient denies any localized pain otherwise.   She notes that she generally consumes plenty of fruits and vegetables in the diet.   She reports slight numbness in the bottom of her face. Patient is now  taking gabapentin 200 mg 2 times daily, after lunch and at bedtime.   She reports that she is taking 12 mcg/h fentanyl  patch every 72 hours.  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 No uterine cancer   IR IMAGING GUIDED PORT INSERTION  05/16/2021   IR RADIOLOGIST EVAL & MGMT  12/13/2018   THYROIDECTOMY N/A 08/02/2020   Procedure: TOTAL THYROIDECTOMY;  Surgeon: Oralee Billow, MD;  Location: [facility];  Service: General;  Laterality: N/A;   TONSILLECTOMY     age 38   I have reviewed the social  history and family history with the patient and they are unchanged from previous note.  ALLERGIES:  is allergic to ace inhibitors, benadryl [diphenhydramine], diamox [acetazolamide], sulfamethoxazole, lenalidomide , and penicillins.  MEDICATIONS:  Current Outpatient Medications  Medication Sig Dispense Refill   acyclovir  (ZOVIRAX ) 400 MG tablet Take 1 tablet (400 mg total) by mouth 2 (two) times daily. 60 tablet 3   ALPHAGAN  P 0.1 % SOLN Place 1 drop into both eyes in the morning, at noon, and at bedtime.      amLODipine  (NORVASC ) 10 MG tablet Take 1 tablet by mouth once daily 90 tablet 0   aspirin EC 81 MG tablet Take 81 mg by mouth daily.     Calcium  Carb-Cholecalciferol (CALCIUM  600 + D PO) Take by mouth.     Cholecalciferol (VITAMIN D3) 125 MCG (5000 UT) TABS Take 1,000 Units by mouth daily.     Co-Enzyme Q-10 100 MG CAPS Take 100 mg by mouth daily.     dapsone 100 MG tablet Take 100 mg by mouth daily.      dorzolamide -timolol  (COSOPT ) 22.3-6.8 MG/ML ophthalmic solution Place 1 drop into both eyes 2 (two) times daily.   11   fentaNYL  (DURAGESIC ) 12 MCG/HR Place 1 patch onto the skin every 3 (three) days. 10 patch 0   filgrastim -sndz (ZARXIO ) 300 MCG/0.5ML SOSY injection Inject 300 mcg into the skin.     gabapentin (NEURONTIN) 300 MG capsule Take 300 mg by mouth at bedtime.     levothyroxine  (SYNTHROID ) 88 MCG tablet Take 1 tablet (88 mcg total) by mouth daily before breakfast. 90 tablet 3   lidocaine -prilocaine  (EMLA ) cream Apply to affected area once 30 g 3   magnesium  chloride (SLOW-MAG) 64 MG TBEC SR tablet Take by mouth.     Multiple Vitamins-Minerals (MULTIVITAMIN WITH MINERALS) tablet Take 1 tablet by mouth daily. Centrum Silver 50 +     Netarsudil-Latanoprost  (ROCKLATAN) 0.02-0.005 % SOLN Place 1 drop into both eyes daily.     Omega-3 1000 MG CAPS Take 1,000 mg by mouth daily.      ondansetron  (ZOFRAN ) 8 MG tablet Take 1 tablet (8 mg total) by mouth every 8 (eight) hours as needed for nausea or vomiting. (Patient not taking: Reported on 02/21/2024) 30 tablet 1   oxyCODONE -acetaminophen  (PERCOCET) 5-325 MG tablet Take 1-2 tablets by mouth every 4 (four) hours as needed for moderate pain or severe pain. 90 tablet 0   polyethylene glycol (MIRALAX ) packet Take 17 g by mouth daily. 30 each 1   prochlorperazine  (COMPAZINE ) 10 MG tablet Take 1 tablet (10 mg total) by mouth every 6 (six) hours as needed for nausea or vomiting. (Patient not taking: Reported on 02/21/2024) 30 tablet 1   senna-docusate (EQ STOOL SOFTENER/LAXATIVE) 8.6-50 MG tablet Take 2 tablets by mouth at bedtime. 60 tablet 2   Simethicone  (GAS-X PO) Take 1 tablet by mouth as needed. (Patient not taking: Reported on 02/21/2024)     teclistamab -cqyv SQ (TECVAYLI ) 153 MG/1.7ML Inject 1.5 mg/kg into the skin once.     vitamin C (ASCORBIC ACID) 500 MG tablet Take 500 mg by mouth 2 (two) times a week.      No current facility-administered  medications for this visit.    PHYSICAL EXAMINATION:. There were no vitals taken for this visit.   GENERAL:alert, in no acute distress and comfortable SKIN: no acute rashes, no significant lesions EYES: conjunctiva are pink and non-injected, sclera anicteric OROPHARYNX: MMM, no exudates, no oropharyngeal erythema or ulceration NECK: supple, no JVD  LYMPH:  no palpable lymphadenopathy in the cervical, axillary or inguinal regions LUNGS: clear to auscultation b/l with normal respiratory effort HEART: regular rate & rhythm ABDOMEN:  normoactive bowel sounds , non tender, not distended. Extremity: no pedal edema PSYCH: alert & oriented x 3 with fluent speech NEURO: no focal motor/sensory deficits    LABORATORY DATA:     Latest Ref Rng & Units 05/18/2023   10:45 AM 05/05/2023    9:20 AM 05/04/2023    8:05 AM  CBC  WBC 4.0 - 10.5 K/uL 3.0  6.9  4.7   Hemoglobin 12.0 - 15.0 g/dL 16.1  09.6  04.5   Hematocrit 36.0 - 46.0 % 33.1  34.7  33.1   Platelets 150 - 400 K/uL 280  220  213       Latest Ref Rng & Units 05/18/2023   10:45 AM 05/04/2023    8:05 AM 04/20/2023    9:33 AM  CMP  Glucose 70 - 99 mg/dL 409  811  914   BUN 8 - 23 mg/dL 14  14  17    Creatinine 0.44 - 1.00 mg/dL 7.82  9.56  2.13   Sodium 135 - 145 mmol/L 137  137  137   Potassium 3.5 - 5.1 mmol/L 4.1  4.1  4.2   Chloride 98 - 111 mmol/L 107  107  106   CO2 22 - 32 mmol/L 26  25  26    Calcium  8.9 - 10.3 mg/dL 8.5  8.7  8.8   Total Protein 6.5 - 8.1 g/dL 7.4  7.0  7.2   Total Bilirubin 0.3 - 1.2 mg/dL 0.4  0.3  0.3   Alkaline Phos 38 - 126 U/L 34  32  34   AST 15 - 41 U/L 12  11  12    ALT 0 - 44 U/L 10  12  12     Component     Latest Ref Rng 12/22/2022  Alpha 1     0.0 - 0.4 g/dL 0.3 (C)  IgG (Immunoglobin G), Serum     586 - 1,602 mg/dL 086   IgM (Immunoglobulin M), Srm     26 - 217 mg/dL 10 (L)   Globulin, Total     2.2 - 3.9 g/dL 2.7 (C)  IgA     64 - 578 mg/dL 34 (L)   Total Protein ELP     6.0 - 8.5  g/dL 6.4 (C)  Albumin  SerPl Elph-Mcnc     2.9 - 4.4 g/dL 3.7 (C)  Alpha2 Glob SerPl Elph-Mcnc     0.4 - 1.0 g/dL 0.7 (C)  B-Globulin SerPl Elph-Mcnc     0.7 - 1.3 g/dL 1.5 (H) (C)  Gamma Glob SerPl Elph-Mcnc     0.4 - 1.8 g/dL 0.2 (L) (C)  M Protein SerPl Elph-Mcnc     Not Observed g/dL 0.9 (H) (C)  Albumin /Glob SerPl     0.7 - 1.7  1.4 (C)  IFE 1 Comment ! (C)  Please Note (HCV): Comment (C)     RADIOGRAPHIC STUDIES: I have personally reviewed the radiological images as listed and agreed with the findings in the report. No results found.   ASSESSMENT & PLAN:   DALAYZA ZAMBRANA is a 83 y.o. female who presents for a follow up for multiple myeloma.   1. IgG lambda Multiple Myeloma  -diagnosed in 11/2018 with M-protein 4.6g -Cytogenetics revealed Trisomy 11 and a 13q deletion -s/p first cycle RVD, Revlimid  stopped after cycle 3  due to rash and replaced with cytoxan .  She achieved complete response with negative M protein and negative PET scan in December 2020. -She subsequently went on Ninlaro  maintenance therapy -PET scan from May 08, 2021, which showed focal activity within the right femur, consistent with active multiple myeloma, no other hypermetabolic lesion or plasmacytoma. -05/06/2021 bone marrow biopsy, which showed 3% plasma cells. -Zometa  every 3 months.  2. Mild anemia -We will continue to monitor with myeloma treatment and current medication  3.  Cancer related pain  -Currently well controlled -Continue fentanyl  patch at 12 mcg/h and Percocet for break through pain.    PLAN:  -Discussed lab results on 03/15/2024 in detail with patient. CBC showed WBC of 10.5K, hemoglobin of 9.4, and platelets of 189K. 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 Discussed that Pentamidine inhalation may be considered.  -continue dapsone until next week. If she has no new symptoms, we will eventually wean off  -will decrease lunchtime dose of gabapentin to 100 MG for 1 week and continue 200 MG at bedtime. If this does not cause any change, then we will drop lunchtime dose completely. We will eventually drop the nighttime dose.  -continue Zarzio 300 mcg twice a week, Monday and Thursday -discussed that Zarzio can cause bone pain -will refill fentanyl  patches 12 mcg/h -Next Zometa  dose in July -Continue Zometa  every 3 months -continue vitamin B12 -recommend taking vitmain B complex so support the bone marrow having fuel for cell production -discussed that there could be paronychia in the toe -discussed option of using foam/gel toe protectors to keep area of inflammation in toe from rubbing -discussed that if the feet are sweaty/moist, it can worsen paronychia  -educated patient that TKI can affect nail and hair growth. Also discussed that some chemotherapy can cause dystrophic nails, but this is not generally caused with antibody treatment as much.  -her skin spot in the back appears to be unchanged. It is very mobile and very defined. There is no emergent need for evaluation of this spot by any other doctor at this time.  -recommend to continue infection precausions including wearing face masks -answered all of patient's and her husband's questions in detail  FOLLOW-UP: Continue Teclistamab  as per orders every 2 weeks with labs MD visit in 2 weeks Zometa  every 12 weeks starting in mid July  The total time spent in the appointment was ***  minutes* .  All of the patient's questions were answered with apparent satisfaction. The patient knows to call the clinic with any problems, questions or concerns.   Jacquelyn Matt MD MS AAHIVMS [facility] Hematology/Oncology Physician [facility]  .*Total Encounter Time as defined by the Centers for Medicare and Medicaid Services includes, in addition to the face-to-face time of a patient visit (documented in the note above) non-face-to-face time: obtaining and reviewing outside history, ordering and reviewing medications, tests or procedures, care coordination (communications with other health care professionals or caregivers) and documentation in the medical record.    I,Mitra Faeizi,acting as  a scribe for Jacquelyn Matt, MD.,have documented all relevant documentation on the behalf of Jacquelyn Matt, MD,as directed by  Jacquelyn Matt, MD while in the presence of Jacquelyn Matt, MD.  ***

## 2024-03-15 ENCOUNTER — Other Ambulatory Visit: Payer: Self-pay

## 2024-03-15 ENCOUNTER — Other Ambulatory Visit (HOSPITAL_COMMUNITY): Payer: Self-pay

## 2024-03-15 ENCOUNTER — Inpatient Hospital Stay: Attending: Hematology

## 2024-03-15 ENCOUNTER — Inpatient Hospital Stay: Admitting: Hematology

## 2024-03-15 ENCOUNTER — Inpatient Hospital Stay

## 2024-03-15 ENCOUNTER — Telehealth: Payer: Self-pay

## 2024-03-15 ENCOUNTER — Encounter: Payer: Self-pay | Admitting: Hematology

## 2024-03-15 VITALS — BP 135/75 | HR 66 | Temp 97.9°F | Resp 18 | Wt 107.6 lb

## 2024-03-15 VITALS — BP 137/66 | HR 68 | Resp 17

## 2024-03-15 DIAGNOSIS — Z95828 Presence of other vascular implants and grafts: Secondary | ICD-10-CM

## 2024-03-15 DIAGNOSIS — C9002 Multiple myeloma in relapse: Secondary | ICD-10-CM | POA: Diagnosis not present

## 2024-03-15 DIAGNOSIS — Z79624 Long term (current) use of inhibitors of nucleotide synthesis: Secondary | ICD-10-CM | POA: Diagnosis not present

## 2024-03-15 DIAGNOSIS — G893 Neoplasm related pain (acute) (chronic): Secondary | ICD-10-CM | POA: Insufficient documentation

## 2024-03-15 DIAGNOSIS — D649 Anemia, unspecified: Secondary | ICD-10-CM | POA: Insufficient documentation

## 2024-03-15 DIAGNOSIS — L600 Ingrowing nail: Secondary | ICD-10-CM | POA: Insufficient documentation

## 2024-03-15 DIAGNOSIS — Z5111 Encounter for antineoplastic chemotherapy: Secondary | ICD-10-CM | POA: Insufficient documentation

## 2024-03-15 DIAGNOSIS — I10 Essential (primary) hypertension: Secondary | ICD-10-CM | POA: Diagnosis not present

## 2024-03-15 DIAGNOSIS — Z79890 Hormone replacement therapy (postmenopausal): Secondary | ICD-10-CM | POA: Diagnosis not present

## 2024-03-15 DIAGNOSIS — C9000 Multiple myeloma not having achieved remission: Secondary | ICD-10-CM

## 2024-03-15 DIAGNOSIS — Z79899 Other long term (current) drug therapy: Secondary | ICD-10-CM | POA: Diagnosis not present

## 2024-03-15 DIAGNOSIS — R200 Anesthesia of skin: Secondary | ICD-10-CM | POA: Diagnosis not present

## 2024-03-15 DIAGNOSIS — Z7189 Other specified counseling: Secondary | ICD-10-CM

## 2024-03-15 DIAGNOSIS — M8580 Other specified disorders of bone density and structure, unspecified site: Secondary | ICD-10-CM | POA: Diagnosis not present

## 2024-03-15 DIAGNOSIS — Z860101 Personal history of adenomatous and serrated colon polyps: Secondary | ICD-10-CM | POA: Diagnosis not present

## 2024-03-15 DIAGNOSIS — Z7982 Long term (current) use of aspirin: Secondary | ICD-10-CM | POA: Insufficient documentation

## 2024-03-15 LAB — CMP (CANCER CENTER ONLY)
ALT: 14 U/L (ref 0–44)
AST: 19 U/L (ref 15–41)
Albumin: 4.4 g/dL (ref 3.5–5.0)
Alkaline Phosphatase: 63 U/L (ref 38–126)
Anion gap: 5 (ref 5–15)
BUN: 17 mg/dL (ref 8–23)
CO2: 28 mmol/L (ref 22–32)
Calcium: 8.9 mg/dL (ref 8.9–10.3)
Chloride: 106 mmol/L (ref 98–111)
Creatinine: 0.67 mg/dL (ref 0.44–1.00)
GFR, Estimated: >60 mL/min (ref >60)
Glucose, Bld: 105 mg/dL — ABNORMAL HIGH (ref 70–99)
Potassium: 4.4 mmol/L (ref 3.5–5.1)
Sodium: 139 mmol/L (ref 135–145)
Total Bilirubin: 0.5 mg/dL (ref 0.0–1.2)
Total Protein: 5.8 g/dL — ABNORMAL LOW (ref 6.5–8.1)

## 2024-03-15 LAB — CBC WITH DIFFERENTIAL (CANCER CENTER ONLY)
Abs Immature Granulocytes: 1.29 10*3/uL — ABNORMAL HIGH (ref 0.00–0.07)
Basophils Absolute: 0.2 10*3/uL — ABNORMAL HIGH (ref 0.0–0.1)
Basophils Relative: 1 %
Eosinophils Absolute: 0.1 10*3/uL (ref 0.0–0.5)
Eosinophils Relative: 1 %
HCT: 28.7 % — ABNORMAL LOW (ref 36.0–46.0)
Hemoglobin: 9.4 g/dL — ABNORMAL LOW (ref 12.0–15.0)
Immature Granulocytes: 12 %
Lymphocytes Relative: 3 %
Lymphs Abs: 0.3 10*3/uL — ABNORMAL LOW (ref 0.7–4.0)
MCH: 34.8 pg — ABNORMAL HIGH (ref 26.0–34.0)
MCHC: 32.8 g/dL (ref 30.0–36.0)
MCV: 106.3 fL — ABNORMAL HIGH (ref 80.0–100.0)
Monocytes Absolute: 0.8 10*3/uL (ref 0.1–1.0)
Monocytes Relative: 8 %
Neutro Abs: 7.8 10*3/uL — ABNORMAL HIGH (ref 1.7–7.7)
Neutrophils Relative %: 75 %
Platelet Count: 189 10*3/uL (ref 150–400)
RBC: 2.7 MIL/uL — ABNORMAL LOW (ref 3.87–5.11)
RDW: 14.6 % (ref 11.5–15.5)
Smear Review: NORMAL
WBC Count: 10.5 10*3/uL (ref 4.0–10.5)
nRBC: 0 % (ref 0.0–0.2)

## 2024-03-15 MED ORDER — SODIUM CHLORIDE 0.9% FLUSH
10.0000 mL | Freq: Once | INTRAVENOUS | Status: AC
  Administered 2024-03-15: 10 mL via INTRAVENOUS

## 2024-03-15 MED ORDER — HEPARIN SOD (PORK) LOCK FLUSH 100 UNIT/ML IV SOLN
500.0000 [IU] | Freq: Once | INTRAVENOUS | Status: AC
  Administered 2024-03-15: 500 [IU] via INTRAVENOUS

## 2024-03-15 MED ORDER — SODIUM CHLORIDE 0.9% FLUSH
10.0000 mL | Freq: Once | INTRAVENOUS | Status: AC
  Administered 2024-03-15: 10 mL

## 2024-03-15 MED ORDER — FILGRASTIM-SNDZ 300 MCG/0.5ML IJ SOSY
300.0000 ug | PREFILLED_SYRINGE | INTRAMUSCULAR | 2 refills | Status: DC
  Filled 2024-03-15: qty 4, 28d supply, fill #0

## 2024-03-15 MED ORDER — FENTANYL 12 MCG/HR TD PT72: 1.0000 | MEDICATED_PATCH | TRANSDERMAL | 0 refills | Status: DC

## 2024-03-15 MED ORDER — TECLISTAMAB-CQYV CHEMO 153 MG/1.7ML SQ SOLN
1.5000 mg/kg | Freq: Once | SUBCUTANEOUS | Status: AC
  Administered 2024-03-15: 70 mg via SUBCUTANEOUS
  Filled 2024-03-15: qty 0.78

## 2024-03-15 NOTE — Progress Notes (Signed)
 Per Katie patient taking Zarxio in conjunction with BiTE therapy for multiple myeloma. [employer] grant added to [facility]. Enrolled and routed to Ben/Adrienne for onboarding.

## 2024-03-15 NOTE — Telephone Encounter (Signed)
 Oncology Pharmacist Encounter   Patient presented in clinic to return to [facility] to receive Tecvayli with Dr. Salomon Cree. I saw patient to give her information as well as the patient wallet card. Patient states she received education at the beginning of her treatment and does not have any questions. Patient is currently on cycle 10 (q28 day cycles) day 1 today and receives every 2 weeks.   Patient currently receiving Zarxio through the outpatient pharmacy and is taking it on Monday and Thursday each week. Patient brought in the information on the [employer]well grant to continue receiving the medication. It is currently too early to fill at this time but will be able to test claim it tomorrow 6/12 to make sure [facility] can fill the medication. The information is below.   [employer] identification number: [id number] Fund: Multiple Myeloma- Medicare Access  Effective Dates:   Pharmacy card details are below: Member ID: [id number] Group #: [id number] RxBIN #: [id number] PCN #: [id number]  Processor: PDMI   Patient has not questions or concerns and has received my phone number in case she has any questions. Her next visit is on 03/29/24 for labs/ injection.  Jmari Pelc, PharmD Hematology/Oncology Clinical Pharmacist [facility] [phone]

## 2024-03-15 NOTE — Patient Instructions (Addendum)
 [facility] - A DEPT OF [facility]  Discharge Instructions: Thank you for choosing [facility] to provide your oncology and hematology care.   If you have a lab appointment with the Cancer Center, please go directly to the Cancer Center and check in at the registration area.   Wear comfortable clothing and clothing appropriate for easy access to any Portacath or PICC line.   We strive to give you quality time with your provider. You may need to reschedule your appointment if you arrive late (15 or more minutes).  Arriving late affects you and other patients whose appointments are after yours.  Also, if you miss three or more appointments without notifying the office, you may be dismissed from the clinic at the provider's discretion.      For prescription refill requests, have your pharmacy contact our office and allow 72 hours for refills to be completed.    Today you received the following chemotherapy and/or immunotherapy agents: Tecvayli       To help prevent nausea and vomiting after your treatment, we encourage you to take your nausea medication as directed.  BELOW ARE SYMPTOMS THAT SHOULD BE REPORTED IMMEDIATELY: *FEVER GREATER THAN 100.4 F (38 C) OR HIGHER *CHILLS OR SWEATING *NAUSEA AND VOMITING THAT IS NOT CONTROLLED WITH YOUR NAUSEA MEDICATION *UNUSUAL SHORTNESS OF BREATH *UNUSUAL BRUISING OR BLEEDING *URINARY PROBLEMS (pain or burning when urinating, or frequent urination) *BOWEL PROBLEMS (unusual diarrhea, constipation, pain near the anus) TENDERNESS IN MOUTH AND THROAT WITH OR WITHOUT PRESENCE OF ULCERS (sore throat, sores in mouth, or a toothache) UNUSUAL RASH, SWELLING OR PAIN  UNUSUAL VAGINAL DISCHARGE OR ITCHING   Items with * indicate a potential emergency and should be followed up as soon as possible or go to the Emergency Department if any problems should occur.  Please show the CHEMOTHERAPY ALERT CARD or IMMUNOTHERAPY  ALERT CARD at check-in to the Emergency Department and triage nurse.  Should you have questions after your visit or need to cancel or reschedule your appointment, please contact [facility] - A DEPT OF [facility]  Dept: [phone]  and follow the prompts.  Office hours are 8:00 a.m. to 4:30 p.m. Monday - Friday. Please note that voicemails left after 4:00 p.m. may not be returned until the following business day.  We are closed weekends and major holidays. You have access to a nurse at all times for urgent questions. Please call the main number to the clinic Dept: [phone] and follow the prompts.   For any non-urgent questions, you may also contact your provider using MyChart. We now offer e-Visits for anyone 31 and older to request care online for non-urgent symptoms. For details visit [email or web address].   Also download the MyChart app! Go to the app store, search "MyChart", open the app, select [place], and log in with your MyChart username and password.

## 2024-03-16 ENCOUNTER — Other Ambulatory Visit (HOSPITAL_COMMUNITY): Payer: Self-pay

## 2024-03-19 ENCOUNTER — Encounter: Payer: Self-pay | Admitting: Hematology

## 2024-03-29 ENCOUNTER — Inpatient Hospital Stay

## 2024-03-29 DIAGNOSIS — Z5111 Encounter for antineoplastic chemotherapy: Secondary | ICD-10-CM | POA: Diagnosis not present

## 2024-03-29 DIAGNOSIS — C9002 Multiple myeloma in relapse: Secondary | ICD-10-CM | POA: Diagnosis not present

## 2024-03-29 DIAGNOSIS — Z7189 Other specified counseling: Secondary | ICD-10-CM

## 2024-03-29 LAB — CMP (CANCER CENTER ONLY)
ALT: 14 U/L (ref 0–44)
AST: 18 U/L (ref 15–41)
Albumin: 4.4 g/dL (ref 3.5–5.0)
Alkaline Phosphatase: 59 U/L (ref 38–126)
Anion gap: 4 — ABNORMAL LOW (ref 5–15)
BUN: 16 mg/dL (ref 8–23)
CO2: 29 mmol/L (ref 22–32)
Calcium: 9.2 mg/dL (ref 8.9–10.3)
Chloride: 105 mmol/L (ref 98–111)
Creatinine: 0.68 mg/dL (ref 0.44–1.00)
GFR, Estimated: >60 mL/min (ref >60)
Glucose, Bld: 112 mg/dL — ABNORMAL HIGH (ref 70–99)
Potassium: 4.5 mmol/L (ref 3.5–5.1)
Sodium: 138 mmol/L (ref 135–145)
Total Bilirubin: 0.6 mg/dL (ref 0.0–1.2)
Total Protein: 5.8 g/dL — ABNORMAL LOW (ref 6.5–8.1)

## 2024-03-29 LAB — CBC WITH DIFFERENTIAL (CANCER CENTER ONLY)
Abs Immature Granulocytes: 0 10*3/uL (ref 0.00–0.07)
Band Neutrophils: 3 %
Basophils Absolute: 0.1 10*3/uL (ref 0.0–0.1)
Basophils Relative: 1 %
Eosinophils Absolute: 0.1 10*3/uL (ref 0.0–0.5)
Eosinophils Relative: 1 %
HCT: 28.8 % — ABNORMAL LOW (ref 36.0–46.0)
Hemoglobin: 9.6 g/dL — ABNORMAL LOW (ref 12.0–15.0)
Lymphocytes Relative: 4 %
Lymphs Abs: 0.5 10*3/uL — ABNORMAL LOW (ref 0.7–4.0)
MCH: 35.2 pg — ABNORMAL HIGH (ref 26.0–34.0)
MCHC: 33.3 g/dL (ref 30.0–36.0)
MCV: 105.5 fL — ABNORMAL HIGH (ref 80.0–100.0)
Monocytes Absolute: 0.9 10*3/uL (ref 0.1–1.0)
Monocytes Relative: 8 %
Neutro Abs: 9.7 10*3/uL — ABNORMAL HIGH (ref 1.7–7.7)
Neutrophils Relative %: 83 %
Platelet Count: 210 10*3/uL (ref 150–400)
RBC: 2.73 MIL/uL — ABNORMAL LOW (ref 3.87–5.11)
RDW: 15.5 % (ref 11.5–15.5)
Smear Review: NORMAL
WBC Count: 11.3 10*3/uL — ABNORMAL HIGH (ref 4.0–10.5)
nRBC: 0 % (ref 0.0–0.2)

## 2024-03-29 MED ORDER — TECLISTAMAB-CQYV CHEMO 153 MG/1.7ML SQ SOLN
1.5000 mg/kg | Freq: Once | SUBCUTANEOUS | Status: AC
  Administered 2024-03-29: 70 mg via SUBCUTANEOUS
  Filled 2024-03-29: qty 0.78

## 2024-03-29 NOTE — Patient Instructions (Addendum)
 [facility] - A DEPT OF [facility]  Discharge Instructions: Thank you for choosing [facility] to provide your oncology and hematology care.   If you have a lab appointment with the Cancer Center, please go directly to the Cancer Center and check in at the registration area.   Wear comfortable clothing and clothing appropriate for easy access to any Portacath or PICC line.   We strive to give you quality time with your provider. You may need to reschedule your appointment if you arrive late (15 or more minutes).  Arriving late affects you and other patients whose appointments are after yours.  Also, if you miss three or more appointments without notifying the office, you may be dismissed from the clinic at the provider's discretion.      For prescription refill requests, have your pharmacy contact our office and allow 72 hours for refills to be completed.    Today you received the following chemotherapy and/or immunotherapy agents: teclistamab      To help prevent nausea and vomiting after your treatment, we encourage you to take your nausea medication as directed.  BELOW ARE SYMPTOMS THAT SHOULD BE REPORTED IMMEDIATELY: *FEVER GREATER THAN 100.4 F (38 C) OR HIGHER *CHILLS OR SWEATING *NAUSEA AND VOMITING THAT IS NOT CONTROLLED WITH YOUR NAUSEA MEDICATION *UNUSUAL SHORTNESS OF BREATH *UNUSUAL BRUISING OR BLEEDING *URINARY PROBLEMS (pain or burning when urinating, or frequent urination) *BOWEL PROBLEMS (unusual diarrhea, constipation, pain near the anus) TENDERNESS IN MOUTH AND THROAT WITH OR WITHOUT PRESENCE OF ULCERS (sore throat, sores in mouth, or a toothache) UNUSUAL RASH, SWELLING OR PAIN  UNUSUAL VAGINAL DISCHARGE OR ITCHING   Items with * indicate a potential emergency and should be followed up as soon as possible or go to the Emergency Department if any problems should occur.  Please show the CHEMOTHERAPY ALERT CARD or  IMMUNOTHERAPY ALERT CARD at check-in to the Emergency Department and triage nurse.  Should you have questions after your visit or need to cancel or reschedule your appointment, please contact [facility] - A DEPT OF [facility]  Dept: [phone]  and follow the prompts.  Office hours are 8:00 a.m. to 4:30 p.m. Monday - Friday. Please note that voicemails left after 4:00 p.m. may not be returned until the following business day.  We are closed weekends and major holidays. You have access to a nurse at all times for urgent questions. Please call the main number to the clinic Dept: [phone] and follow the prompts.   For any non-urgent questions, you may also contact your provider using MyChart. We now offer e-Visits for anyone 18 and older to request care online for non-urgent symptoms. For details visit [email or web address].   Also download the MyChart app! Go to the app store, search "MyChart", open the app, select [facility], and log in with your MyChart username and password.

## 2024-03-30 ENCOUNTER — Encounter: Payer: Self-pay | Admitting: Hematology

## 2024-04-03 ENCOUNTER — Encounter: Payer: Self-pay | Admitting: Family Medicine

## 2024-04-03 ENCOUNTER — Other Ambulatory Visit: Payer: Self-pay

## 2024-04-03 MED ORDER — AMLODIPINE BESYLATE 10 MG PO TABS: 10.0000 mg | ORAL_TABLET | Freq: Every day | ORAL | 3 refills | Status: DC

## 2024-04-03 NOTE — Telephone Encounter (Signed)
 See below, refill sent.

## 2024-04-04 ENCOUNTER — Encounter: Payer: Self-pay | Admitting: Hematology

## 2024-04-05 ENCOUNTER — Ambulatory Visit (INDEPENDENT_AMBULATORY_CARE_PROVIDER_SITE_OTHER): Admitting: Podiatry

## 2024-04-05 DIAGNOSIS — B351 Tinea unguium: Secondary | ICD-10-CM

## 2024-04-05 DIAGNOSIS — M79674 Pain in right toe(s): Secondary | ICD-10-CM

## 2024-04-05 DIAGNOSIS — M79675 Pain in left toe(s): Secondary | ICD-10-CM

## 2024-04-10 ENCOUNTER — Other Ambulatory Visit (HOSPITAL_COMMUNITY): Payer: Self-pay

## 2024-04-10 ENCOUNTER — Other Ambulatory Visit: Payer: Self-pay | Admitting: Pharmacy Technician

## 2024-04-10 ENCOUNTER — Other Ambulatory Visit: Payer: Self-pay

## 2024-04-10 DIAGNOSIS — C9002 Multiple myeloma in relapse: Secondary | ICD-10-CM

## 2024-04-10 MED ORDER — FILGRASTIM-SNDZ 300 MCG/0.5ML IJ SOSY
300.0000 ug | PREFILLED_SYRINGE | INTRAMUSCULAR | 2 refills | Status: DC
  Filled 2024-04-10: qty 4, 28d supply, fill #0
  Filled 2024-05-03: qty 4, 28d supply, fill #1
  Filled 2024-06-01: qty 4, 28d supply, fill #2

## 2024-04-10 NOTE — Progress Notes (Signed)
 Specialty Pharmacy Initial Fill Coordination Note  Megan Olson is a 83 y.o. female contacted today regarding refills of specialty medication(s) Filgrastim -sndz (ZARXIO ) .  Patient requested [name] at [facility] at [place]  on 04/12/24  /Medication will be filled on 04/11/24.   Patient is aware of $0 copayment.

## 2024-04-10 NOTE — Progress Notes (Signed)
 Patient does not need counseling on Zarxio . Patient is continuing therapy from transfer of care and is aware of how to administer.   Neyah Ellerman, PharmD Hematology/Oncology Clinical Pharmacist [facility] [phone]

## 2024-04-10 NOTE — Progress Notes (Signed)
   Chief Complaint  Patient presents with   Ingrown Toenail    RM#6 Patient present today with right foot great toe nail ingrown and possible fungus on left great toe. Patient states is a cancer patient and needs this evaluated.    SUBJECTIVE Patient presents to office today complaining of elongated, thickened nails that cause pain while ambulating in shoes.  Patient is unable to trim their own nails.  Due to the sensitivitypatient is concerned for possible ingrown toenail.  Patient is here for further evaluation and treatment.  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 Yes Did it involve sudden or severe rash/hives, skin peeling, or any reaction on the inside of your mouth or nose? No Did you need to seek medical attention at a hospital or doctor's office? No When did it last happen?      more than 10 years ago If all above answers are "NO", may proceed with cephalosporin use.      OBJECTIVE General Patient is awake, alert, and oriented x 3 and in no acute distress. Derm Skin is dry and supple bilateral. Negative open lesions or macerations. Remaining integument unremarkable. Nails are tender, long, thickened and dystrophic with subungual debris, consistent with onychomycosis, 1-5 bilateral. No signs of infection noted.  Mild ingrown portion of toenail noted on the right great toe. Vasc  DP and PT pedal pulses palpable bilaterally. Temperature  gradient within normal limits.  Neuro light touch and protective threshold sensation grossly intact bilaterally.  Musculoskeletal Exam No symptomatic pedal deformities noted bilateral. Muscular strength within normal limits.  ASSESSMENT 1.  Pain due to onychomycosis of toenails both 2.  Mild ingrown toenail right great toe  PLAN OF CARE -Patient evaluated today.  -Instructed to maintain good pedal hygiene and foot care.  -Mechanical debridement of nails 1-5 bilaterally performed using a nail nipper. Filed with dremel without incident.  -Return to clinic in 3 mos. routine footcare   Thresa EMERSON Sar, DPM [facility]  Dr. Thresa EMERSON Sar, DPM    [address] [place], [place] [address]                Office [phone]  Fax [phone]

## 2024-04-11 ENCOUNTER — Other Ambulatory Visit: Payer: Self-pay

## 2024-04-11 ENCOUNTER — Other Ambulatory Visit (HOSPITAL_COMMUNITY): Payer: Self-pay

## 2024-04-12 ENCOUNTER — Inpatient Hospital Stay

## 2024-04-12 ENCOUNTER — Inpatient Hospital Stay: Attending: Physician Assistant | Admitting: Physician Assistant

## 2024-04-12 VITALS — BP 157/77 | HR 64 | Temp 98.1°F | Resp 13 | Wt 106.7 lb

## 2024-04-12 DIAGNOSIS — Z5111 Encounter for antineoplastic chemotherapy: Secondary | ICD-10-CM | POA: Diagnosis present

## 2024-04-12 DIAGNOSIS — M549 Dorsalgia, unspecified: Secondary | ICD-10-CM | POA: Insufficient documentation

## 2024-04-12 DIAGNOSIS — Z79899 Other long term (current) drug therapy: Secondary | ICD-10-CM | POA: Diagnosis not present

## 2024-04-12 DIAGNOSIS — M8580 Other specified disorders of bone density and structure, unspecified site: Secondary | ICD-10-CM | POA: Insufficient documentation

## 2024-04-12 DIAGNOSIS — G893 Neoplasm related pain (acute) (chronic): Secondary | ICD-10-CM | POA: Diagnosis not present

## 2024-04-12 DIAGNOSIS — R5383 Other fatigue: Secondary | ICD-10-CM | POA: Diagnosis not present

## 2024-04-12 DIAGNOSIS — Z860101 Personal history of adenomatous and serrated colon polyps: Secondary | ICD-10-CM | POA: Insufficient documentation

## 2024-04-12 DIAGNOSIS — C9002 Multiple myeloma in relapse: Secondary | ICD-10-CM | POA: Insufficient documentation

## 2024-04-12 DIAGNOSIS — Z5112 Encounter for antineoplastic immunotherapy: Secondary | ICD-10-CM | POA: Insufficient documentation

## 2024-04-12 DIAGNOSIS — Z7189 Other specified counseling: Secondary | ICD-10-CM

## 2024-04-12 DIAGNOSIS — Z79624 Long term (current) use of inhibitors of nucleotide synthesis: Secondary | ICD-10-CM | POA: Insufficient documentation

## 2024-04-12 DIAGNOSIS — R300 Dysuria: Secondary | ICD-10-CM

## 2024-04-12 DIAGNOSIS — I10 Essential (primary) hypertension: Secondary | ICD-10-CM | POA: Insufficient documentation

## 2024-04-12 DIAGNOSIS — Z7982 Long term (current) use of aspirin: Secondary | ICD-10-CM | POA: Diagnosis not present

## 2024-04-12 DIAGNOSIS — Z85850 Personal history of malignant neoplasm of thyroid: Secondary | ICD-10-CM | POA: Insufficient documentation

## 2024-04-12 DIAGNOSIS — Z79890 Hormone replacement therapy (postmenopausal): Secondary | ICD-10-CM | POA: Diagnosis not present

## 2024-04-12 DIAGNOSIS — C9000 Multiple myeloma not having achieved remission: Secondary | ICD-10-CM

## 2024-04-12 DIAGNOSIS — Z95828 Presence of other vascular implants and grafts: Secondary | ICD-10-CM

## 2024-04-12 LAB — CBC WITH DIFFERENTIAL (CANCER CENTER ONLY)
Abs Immature Granulocytes: 0 K/uL (ref 0.00–0.07)
Basophils Absolute: 0 K/uL (ref 0.0–0.1)
Basophils Relative: 0 %
Eosinophils Absolute: 0.1 K/uL (ref 0.0–0.5)
Eosinophils Relative: 1 %
HCT: 29.6 % — ABNORMAL LOW (ref 36.0–46.0)
Hemoglobin: 9.8 g/dL — ABNORMAL LOW (ref 12.0–15.0)
Lymphocytes Relative: 5 %
Lymphs Abs: 0.5 K/uL — ABNORMAL LOW (ref 0.7–4.0)
MCH: 34.6 pg — ABNORMAL HIGH (ref 26.0–34.0)
MCHC: 33.1 g/dL (ref 30.0–36.0)
MCV: 104.6 fL — ABNORMAL HIGH (ref 80.0–100.0)
Monocytes Absolute: 0.7 K/uL (ref 0.1–1.0)
Monocytes Relative: 7 %
Neutro Abs: 9.1 K/uL — ABNORMAL HIGH (ref 1.7–7.7)
Neutrophils Relative %: 87 %
Platelet Count: 219 K/uL (ref 150–400)
RBC: 2.83 MIL/uL — ABNORMAL LOW (ref 3.87–5.11)
RDW: 14.9 % (ref 11.5–15.5)
WBC Count: 10.5 K/uL (ref 4.0–10.5)
nRBC: 0 % (ref 0.0–0.2)

## 2024-04-12 LAB — CMP (CANCER CENTER ONLY)
ALT: 14 U/L (ref 0–44)
AST: 18 U/L (ref 15–41)
Albumin: 4.2 g/dL (ref 3.5–5.0)
Alkaline Phosphatase: 63 U/L (ref 38–126)
Anion gap: 4 — ABNORMAL LOW (ref 5–15)
BUN: 14 mg/dL (ref 8–23)
CO2: 29 mmol/L (ref 22–32)
Calcium: 9.1 mg/dL (ref 8.9–10.3)
Chloride: 106 mmol/L (ref 98–111)
Creatinine: 0.68 mg/dL (ref 0.44–1.00)
GFR, Estimated: >60 mL/min (ref >60)
Glucose, Bld: 100 mg/dL — ABNORMAL HIGH (ref 70–99)
Potassium: 4.2 mmol/L (ref 3.5–5.1)
Sodium: 139 mmol/L (ref 135–145)
Total Bilirubin: 0.6 mg/dL (ref 0.0–1.2)
Total Protein: 5.7 g/dL — ABNORMAL LOW (ref 6.5–8.1)

## 2024-04-12 MED ORDER — SODIUM CHLORIDE 0.9% FLUSH
10.0000 mL | Freq: Once | INTRAVENOUS | Status: AC | PRN
  Administered 2024-04-12: 10 mL

## 2024-04-12 MED ORDER — TECLISTAMAB-CQYV CHEMO 153 MG/1.7ML SQ SOLN
1.5000 mg/kg | Freq: Once | SUBCUTANEOUS | Status: AC
  Administered 2024-04-12: 70 mg via SUBCUTANEOUS
  Filled 2024-04-12: qty 0.78

## 2024-04-12 NOTE — Patient Instructions (Signed)
 [facility] - A DEPT OF [facility]  Discharge Instructions: Thank you for choosing [facility] to provide your oncology and hematology care.   If you have a lab appointment with the Cancer Center, please go directly to the Cancer Center and check in at the registration area.   Wear comfortable clothing and clothing appropriate for easy access to any Portacath or PICC line.   We strive to give you quality time with your provider. You may need to reschedule your appointment if you arrive late (15 or more minutes).  Arriving late affects you and other patients whose appointments are after yours.  Also, if you miss three or more appointments without notifying the office, you may be dismissed from the clinic at the provider's discretion.      For prescription refill requests, have your pharmacy contact our office and allow 72 hours for refills to be completed.    Today you received the following chemotherapy and/or immunotherapy agents: teclistamab      To help prevent nausea and vomiting after your treatment, we encourage you to take your nausea medication as directed.  BELOW ARE SYMPTOMS THAT SHOULD BE REPORTED IMMEDIATELY: *FEVER GREATER THAN 100.4 F (38 C) OR HIGHER *CHILLS OR SWEATING *NAUSEA AND VOMITING THAT IS NOT CONTROLLED WITH YOUR NAUSEA MEDICATION *UNUSUAL SHORTNESS OF BREATH *UNUSUAL BRUISING OR BLEEDING *URINARY PROBLEMS (pain or burning when urinating, or frequent urination) *BOWEL PROBLEMS (unusual diarrhea, constipation, pain near the anus) TENDERNESS IN MOUTH AND THROAT WITH OR WITHOUT PRESENCE OF ULCERS (sore throat, sores in mouth, or a toothache) UNUSUAL RASH, SWELLING OR PAIN  UNUSUAL VAGINAL DISCHARGE OR ITCHING   Items with * indicate a potential emergency and should be followed up as soon as possible or go to the Emergency Department if any problems should occur.  Please show the CHEMOTHERAPY ALERT CARD or  IMMUNOTHERAPY ALERT CARD at check-in to the Emergency Department and triage nurse.  Should you have questions after your visit or need to cancel or reschedule your appointment, please contact [facility] - A DEPT OF [facility]  Dept: [phone]  and follow the prompts.  Office hours are 8:00 a.m. to 4:30 p.m. Monday - Friday. Please note that voicemails left after 4:00 p.m. may not be returned until the following business day.  We are closed weekends and major holidays. You have access to a nurse at all times for urgent questions. Please call the main number to the clinic Dept: [phone] and follow the prompts.   For any non-urgent questions, you may also contact your provider using MyChart. We now offer e-Visits for anyone 18 and older to request care online for non-urgent symptoms. For details visit [email or web address].   Also download the MyChart app! Go to the app store, search "MyChart", open the app, select [facility], and log in with your MyChart username and password.

## 2024-04-13 ENCOUNTER — Encounter: Payer: Self-pay | Admitting: Hematology

## 2024-04-13 ENCOUNTER — Other Ambulatory Visit: Payer: Self-pay

## 2024-04-13 DIAGNOSIS — C9000 Multiple myeloma not having achieved remission: Secondary | ICD-10-CM

## 2024-04-13 MED ORDER — FENTANYL 12 MCG/HR TD PT72: 1.0000 | MEDICATED_PATCH | TRANSDERMAL | 0 refills | Status: DC

## 2024-04-13 NOTE — Progress Notes (Signed)
 [facility]   [phone] [phone]) [phone]   Clinic Follow up Note   Patient Care Team: Katrinka Garnette KIDD, MD as PCP - General (Family Medicine) Onesimo Emaline Brink, MD as Consulting Physician (Hematology) Bond, Reyes Mcardle, MD as Referring Physician (Ophthalmology) Evern Nian, Rogue, MD as Consulting Physician (Hematology and Oncology) Trixie File, MD as Consulting Physician (Internal Medicine) Eletha Boas, MD as Consulting Physician (General Surgery) Nicholaus Sherlean CROME, [facility] (Inactive) as Pharmacist (Pharmacist)  Date of Service: 04/13/24  CHIEF COMPLAINT:  Follow-up for continuation and management of multiple myeloma   SUMMARY OF ONCOLOGIC HISTORY: Oncology History  Multiple myeloma not having achieved remission (HCC)  12/12/2018 Initial Diagnosis   Multiple myeloma not having achieved remission (HCC)   12/20/2018 - 10/04/2019 Chemotherapy   The patient had dexamethasone  (DECADRON ) tablet 20 mg, 20 mg (100 % of original dose 20 mg), Oral, Once, 14 of 14 cycles Dose modification: 20 mg (original dose 20 mg, Cycle 1), 10 mg (original dose 20 mg, Cycle 14) Administration: 20 mg (12/20/2018), 20 mg (12/27/2018), 20 mg (01/10/2019), 20 mg (01/17/2019), 20 mg (01/31/2019), 20 mg (02/07/2019), 20 mg (03/14/2019), 20 mg (03/21/2019), 20 mg (02/21/2019), 20 mg (02/28/2019), 20 mg (04/04/2019), 20 mg (04/11/2019), 20 mg (04/25/2019), 20 mg (05/02/2019), 20 mg (05/16/2019), 20 mg (05/23/2019), 20 mg (06/06/2019), 20 mg (06/13/2019), 20 mg (06/27/2019), 20 mg (07/04/2019), 20 mg (07/18/2019), 20 mg (07/25/2019), 20 mg (08/08/2019), 20 mg (08/15/2019), 20 mg (08/29/2019), 20 mg (09/05/2019), 10 mg (09/19/2019) lenalidomide  (REVLIMID ) 15 MG capsule, 1 of 1 cycle, Start date: 01/18/2019, End date: 02/21/2019 bortezomib  SQ (VELCADE ) chemo injection 2 mg, 1.3 mg/m2 = 2 mg, Subcutaneous,  Once, 14 of 14 cycles Administration: 2 mg (12/20/2018), 2 mg (12/27/2018), 2 mg (12/23/2018), 2 mg  (12/30/2018), 2 mg (01/10/2019), 2 mg (01/17/2019), 2 mg (01/13/2019), 2 mg (01/20/2019), 2 mg (01/31/2019), 2 mg (02/07/2019), 2 mg (02/03/2019), 2 mg (02/10/2019), 2 mg (03/14/2019), 2 mg (03/17/2019), 2 mg (03/21/2019), 2 mg (03/24/2019), 2 mg (02/21/2019), 2 mg (02/24/2019), 2 mg (02/28/2019), 2 mg (03/03/2019), 2 mg (04/04/2019), 2 mg (04/06/2019), 2 mg (04/11/2019), 2 mg (04/14/2019), 2 mg (04/25/2019), 2 mg (04/28/2019), 2 mg (05/02/2019), 2 mg (05/05/2019), 2 mg (05/16/2019), 2 mg (05/19/2019), 2 mg (05/23/2019), 2 mg (05/26/2019), 2 mg (06/06/2019), 2 mg (06/09/2019), 2 mg (06/13/2019), 2 mg (06/16/2019), 2 mg (06/27/2019), 2 mg (06/30/2019), 2 mg (07/04/2019), 2 mg (07/07/2019), 2 mg (07/18/2019), 2 mg (07/21/2019), 2 mg (07/25/2019), 2 mg (07/28/2019), 2 mg (08/08/2019), 2 mg (08/11/2019), 2 mg (08/15/2019), 2 mg (08/18/2019), 2 mg (08/29/2019), 2 mg (09/01/2019), 2 mg (09/05/2019), 2 mg (09/08/2019), 2 mg (09/19/2019), 2 mg (10/04/2019)  for chemotherapy treatment.    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   INTERVAL HISTORY:  Megan Olson is a 83 y.o. female returns for continued  evaluation and management of multiple myeloma. He is here to start cycle 11 day 1 of teclistamab  therapy. She is unaccompanied for this visit.   Megan Olson reports her energy levels are fairly stable with only experiencing fatigue the day off treatment. She  continues to complete all her ADLs on her own. She denies any appetite or weight changes. She admits to eating a diet with more vegetables and fruits without much protein. She denies nausea, vomiting or bowel habit changes. She reports having ongoing back pain which is controlled with fentanyl  patch and breakthrough percocet as needed. She denies any other sites of bone pain. She denies easy bruising or overt signs of bleeding. She denies fevers, chills, sweats, shortness of breath, chest pain or cough. She has no other complaints.     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 No uterine cancer   IR IMAGING GUIDED PORT INSERTION  05/16/2021   IR RADIOLOGIST EVAL & MGMT  12/13/2018   THYROIDECTOMY N/A 08/02/2020   Procedure: TOTAL THYROIDECTOMY;  Surgeon: Eletha Boas, MD;  Location: [facility];  Service: General;  Laterality: N/A;   TONSILLECTOMY     age 64   I have reviewed the social history and family history with the patient and they are unchanged from previous note.  ALLERGIES:  is allergic to ace inhibitors, benadryl [diphenhydramine], diamox [acetazolamide], sulfamethoxazole, lenalidomide , and penicillins.  MEDICATIONS:  Current Outpatient Medications  Medication Sig Dispense Refill   acyclovir  (ZOVIRAX ) 400 MG tablet Take 1 tablet (400 mg total) by mouth 2 (two) times daily. 60 tablet 3   ALPHAGAN  P 0.1 % SOLN Place 1 drop into both eyes in the morning, at noon, and at bedtime.      amLODipine   (NORVASC ) 10 MG tablet Take 1 tablet (10 mg total) by mouth daily. 90 tablet 3   aspirin EC 81 MG tablet Take 81 mg by mouth daily.     Calcium  Carb-Cholecalciferol (CALCIUM  600 + D PO) Take by mouth.     Cholecalciferol (VITAMIN D3) 125 MCG (5000 UT) TABS Take 1,000 Units by mouth daily.     Co-Enzyme Q-10 100 MG CAPS Take 100 mg by mouth daily.     dapsone 100 MG tablet Take 100 mg by mouth daily.     dorzolamide -timolol  (COSOPT ) 22.3-6.8 MG/ML ophthalmic solution Place 1 drop into both eyes 2 (two) times daily.   11   fentaNYL  (DURAGESIC ) 12 MCG/HR Place 1 patch onto the skin every 3 (three) days. 10 patch 0   filgrastim -sndz (ZARXIO ) 300 MCG/0.5ML SOSY injection Inject 0.5 mLs (300 mcg total) into the skin 2 (two) times a week. On Monday and thursday 4 mL 2   gabapentin (NEURONTIN) 300 MG capsule Take 200 mg by mouth 2 (two) times daily.     levothyroxine  (SYNTHROID ) 88 MCG tablet Take 1 tablet (88 mcg total) by mouth daily before breakfast. 90  tablet 3   lidocaine -prilocaine  (EMLA ) cream Apply to affected area once 30 g 3   magnesium  chloride (SLOW-MAG) 64 MG TBEC SR tablet Take by mouth.     Multiple Vitamins-Minerals (MULTIVITAMIN WITH MINERALS) tablet Take 1 tablet by mouth daily. Centrum Silver 50 +     Netarsudil-Latanoprost  (ROCKLATAN) 0.02-0.005 % SOLN Place 1 drop into both eyes daily.     Omega-3 1000 MG CAPS Take 1,000 mg by mouth daily.      ondansetron  (ZOFRAN ) 8 MG tablet Take 1 tablet (8 mg total) by mouth every 8 (eight) hours as needed for nausea or vomiting. 30 tablet 1   oxyCODONE -acetaminophen  (PERCOCET) 5-325 MG tablet Take 1-2 tablets by mouth every 4 (four) hours as needed for moderate pain or severe pain. 90 tablet 0   polyethylene glycol (MIRALAX ) packet Take 17 g by mouth daily. 30 each 1   prochlorperazine  (COMPAZINE ) 10 MG tablet Take 1 tablet (10 mg total) by mouth every 6 (six) hours as needed for nausea or vomiting. 30 tablet 1   senna-docusate (EQ STOOL  SOFTENER/LAXATIVE) 8.6-50 MG tablet Take 2 tablets by mouth at bedtime. 60 tablet 2   Simethicone  (GAS-X PO) Take 1 tablet by mouth as needed.     teclistamab -cqyv SQ (TECVAYLI ) 153 MG/1.7ML Inject 1.5 mg/kg into the skin once.     vitamin C (ASCORBIC ACID) 500 MG tablet Take 500 mg by mouth 3 (three) times a week.     No current facility-administered medications for this visit.    PHYSICAL EXAMINATION:. BP (!) 157/77 (BP Location: Left Arm, Patient Position: Sitting) Comment: Nurse was notified  Pulse 64   Temp 98.1 F (36.7 C) (Temporal)   Resp 13   Wt 106 lb 11.2 oz (48.4 kg)   SpO2 95%   BMI 20.84 kg/m    GENERAL:alert, in no acute distress and comfortable SKIN: no acute rashes, no significant lesions EYES: conjunctiva are pink and non-injected, sclera anicteric OROPHARYNX: MMM, no exudates, no oropharyngeal erythema or ulceration LUNGS: clear to auscultation b/l with normal respiratory effort HEART: regular rate & rhythm Extremity: no pedal edema PSYCH: alert & oriented x 3 with fluent speech NEURO: no focal motor/sensory deficits    LABORATORY DATA:     Latest Ref Rng & Units 04/12/2024    8:45 AM 03/29/2024    9:20 AM 03/15/2024    8:57 AM  CBC  WBC 4.0 - 10.5 K/uL 10.5  11.3  10.5   Hemoglobin 12.0 - 15.0 g/dL 9.8  9.6  9.4   Hematocrit 36.0 - 46.0 % 29.6  28.8  28.7   Platelets 150 - 400 K/uL 219  210  189       Latest Ref Rng & Units 04/12/2024    8:45 AM 03/29/2024    9:20 AM 03/15/2024    8:57 AM  CMP  Glucose 70 - 99 mg/dL 899  887  894   BUN 8 - 23 mg/dL 14  16  17    Creatinine 0.44 - 1.00 mg/dL 9.31  9.31  9.32   Sodium 135 - 145 mmol/L 139  138  139   Potassium 3.5 - 5.1 mmol/L 4.2  4.5  4.4   Chloride 98 - 111 mmol/L 106  105  106   CO2 22 - 32 mmol/L 29  29  28    Calcium  8.9 - 10.3 mg/dL 9.1  9.2  8.9   Total Protein 6.5 - 8.1 g/dL 5.7  5.8  5.8   Total  Bilirubin 0.0 - 1.2 mg/dL 0.6  0.6  0.5   Alkaline Phos 38 - 126 U/L 63  59  63   AST 15 - 41  U/L 18  18  19    ALT 0 - 44 U/L 14  14  14      RADIOGRAPHIC STUDIES: I have personally reviewed the radiological images as listed and agreed with the findings in the report. No results found.   ASSESSMENT & PLAN:   Megan Olson is a 83 y.o. female who presents for a follow up for multiple myeloma.   1. 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 -Transitioned to PO Cytoxan /Velcade /Dex of each 21-day cycle in May 2020 -Transitioned to maintenance ixazomib therapy in December 2020. -Due to progression of disease in July 2022, started Kyprolis /Dex therapy -Due to progression of disease in August 2023, switched to Dara/Pom/Dex -Due to progression of disease in September 2024, switched to elotuzumab /velcade /dex -Due to progression of disease in October 2024, switched to teclistamab  at [facility]. Tolerated step up therapy without CRS or ICANS.  -Cycle 8, Day 1 of Teclistamab  was delayed by one week 2/2 neutropenia, started on Zarxio  M/W/F of off weeks of therapy.  -Transitioned to Zarxio  Mon/Thurs weekly starting Cycle 9 of Teclistamab .   2.  Cancer related pain  -Currently well controlled -Continue fentanyl  patch at 12 mcg/h and Percocet for break through pain.    PLAN: -Currently on maintenance Tecvayli  every 2 weeks, due for Cycle 11, Day 1 today -Labs from today were reviewed and adequate for treatment. WBC 10.5, Hgb 9.8, Plt 219, Creatinine and calcium  levels normal. LFTs in range.  -Proceed with treatment today without any dose modifications.  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   FOLLOW-UP: -Continue Teclistamab  as per orders every 2 weeks with labs and visit.  -Zometa  every 12 weeks starting next visit on 04/26/2024    All of the patient's questions were answered with apparent satisfaction. The patient knows to call the clinic with any problems, questions or concerns.   I have spent a total of 30 minutes minutes of face-to-face and non-face-to-face time, preparing to see the patient,  performing a medically appropriate examination, counseling and educating the patient,  documenting clinical information in the electronic health record, independently interpreting results and communicating results to the patient, and care coordination.    Johnston Police PA-C Dept of Hematology and Oncology [facility] Phone: [phone]

## 2024-04-17 ENCOUNTER — Other Ambulatory Visit (HOSPITAL_COMMUNITY): Payer: Self-pay

## 2024-04-17 ENCOUNTER — Other Ambulatory Visit: Payer: Self-pay

## 2024-04-17 DIAGNOSIS — C9000 Multiple myeloma not having achieved remission: Secondary | ICD-10-CM

## 2024-04-18 ENCOUNTER — Other Ambulatory Visit: Payer: Self-pay | Admitting: Hematology

## 2024-04-18 ENCOUNTER — Encounter: Payer: Self-pay | Admitting: Hematology

## 2024-04-18 DIAGNOSIS — C9000 Multiple myeloma not having achieved remission: Secondary | ICD-10-CM

## 2024-04-18 MED ORDER — FENTANYL 12 MCG/HR TD PT72: 1.0000 | MEDICATED_PATCH | TRANSDERMAL | 0 refills | Status: DC

## 2024-04-19 ENCOUNTER — Encounter: Payer: Self-pay | Admitting: Podiatry

## 2024-04-19 ENCOUNTER — Ambulatory Visit: Admitting: Podiatry

## 2024-04-19 VITALS — Ht 60.0 in | Wt 106.7 lb

## 2024-04-19 DIAGNOSIS — L600 Ingrowing nail: Secondary | ICD-10-CM | POA: Diagnosis not present

## 2024-04-19 NOTE — Progress Notes (Signed)
   Chief Complaint  Patient presents with   Ingrown Toenail    Pt is here to f/u on right great toenail after having it removed, she states that she followed every instruction that was given and that its healing well, no other complaints.    Subjective: 83 y.o. female presents today status post total temporary nail avulsion of the right hallux nail plate performed on 04/05/2024.  Patient doing well.  No pain.  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  Routine healing noted to the right hallux nail bed.  No erythema or drainage.  Assessment: #1 s/p total temporary nail avulsion right hallux nail plate   Plan of care: -Patient was evaluated  -Okay to discontinue antibiotic ointment and a Band-Aid -Full activity no restrictions -Return to clinic PRN   Thresa EMERSON Sar, DPM [facility]  Dr. Thresa EMERSON Sar, DPM    [address] [place], [place] [address]                Office [phone]  Fax [phone]

## 2024-04-20 ENCOUNTER — Other Ambulatory Visit: Payer: Self-pay | Admitting: Hematology

## 2024-04-20 DIAGNOSIS — C9002 Multiple myeloma in relapse: Secondary | ICD-10-CM

## 2024-04-20 DIAGNOSIS — Z7189 Other specified counseling: Secondary | ICD-10-CM

## 2024-04-21 ENCOUNTER — Other Ambulatory Visit: Payer: Self-pay

## 2024-04-26 ENCOUNTER — Inpatient Hospital Stay

## 2024-04-26 VITALS — BP 137/74 | HR 68 | Temp 98.4°F | Resp 18

## 2024-04-26 DIAGNOSIS — Z5111 Encounter for antineoplastic chemotherapy: Secondary | ICD-10-CM | POA: Diagnosis not present

## 2024-04-26 DIAGNOSIS — C9000 Multiple myeloma not having achieved remission: Secondary | ICD-10-CM

## 2024-04-26 DIAGNOSIS — C9002 Multiple myeloma in relapse: Secondary | ICD-10-CM

## 2024-04-26 DIAGNOSIS — Z95828 Presence of other vascular implants and grafts: Secondary | ICD-10-CM

## 2024-04-26 DIAGNOSIS — Z7189 Other specified counseling: Secondary | ICD-10-CM

## 2024-04-26 LAB — CMP (CANCER CENTER ONLY)
ALT: 16 U/L (ref 0–44)
AST: 19 U/L (ref 15–41)
Albumin: 4.2 g/dL (ref 3.5–5.0)
Alkaline Phosphatase: 65 U/L (ref 38–126)
Anion gap: 3 — ABNORMAL LOW (ref 5–15)
BUN: 18 mg/dL (ref 8–23)
CO2: 29 mmol/L (ref 22–32)
Calcium: 8.8 mg/dL — ABNORMAL LOW (ref 8.9–10.3)
Chloride: 107 mmol/L (ref 98–111)
Creatinine: 0.66 mg/dL (ref 0.44–1.00)
GFR, Estimated: >60 mL/min (ref >60)
Glucose, Bld: 99 mg/dL (ref 70–99)
Potassium: 4.5 mmol/L (ref 3.5–5.1)
Sodium: 139 mmol/L (ref 135–145)
Total Bilirubin: 0.6 mg/dL (ref 0.0–1.2)
Total Protein: 5.7 g/dL — ABNORMAL LOW (ref 6.5–8.1)

## 2024-04-26 LAB — CBC WITH DIFFERENTIAL (CANCER CENTER ONLY)
Abs Immature Granulocytes: 0 K/uL (ref 0.00–0.07)
Basophils Absolute: 0.3 K/uL — ABNORMAL HIGH (ref 0.0–0.1)
Basophils Relative: 2 %
Eosinophils Absolute: 0.1 K/uL (ref 0.0–0.5)
Eosinophils Relative: 1 %
HCT: 28.3 % — ABNORMAL LOW (ref 36.0–46.0)
Hemoglobin: 9.3 g/dL — ABNORMAL LOW (ref 12.0–15.0)
Lymphocytes Relative: 3 %
Lymphs Abs: 0.4 K/uL — ABNORMAL LOW (ref 0.7–4.0)
MCH: 35.1 pg — ABNORMAL HIGH (ref 26.0–34.0)
MCHC: 32.9 g/dL (ref 30.0–36.0)
MCV: 106.8 fL — ABNORMAL HIGH (ref 80.0–100.0)
Monocytes Absolute: 0.6 K/uL (ref 0.1–1.0)
Monocytes Relative: 5 %
Neutro Abs: 11.5 K/uL — ABNORMAL HIGH (ref 1.7–7.7)
Neutrophils Relative %: 89 %
Platelet Count: 236 K/uL (ref 150–400)
RBC: 2.65 MIL/uL — ABNORMAL LOW (ref 3.87–5.11)
RDW: 16.2 % — ABNORMAL HIGH (ref 11.5–15.5)
Smear Review: NORMAL
WBC Count: 12.9 K/uL — ABNORMAL HIGH (ref 4.0–10.5)
nRBC: 0 % (ref 0.0–0.2)

## 2024-04-26 LAB — VITAMIN D 25 HYDROXY (VIT D DEFICIENCY, FRACTURES): Vit D, 25-Hydroxy: 40.52 ng/mL (ref 30–100)

## 2024-04-26 MED ORDER — TECLISTAMAB-CQYV CHEMO 153 MG/1.7ML SQ SOLN
1.5000 mg/kg | Freq: Once | SUBCUTANEOUS | Status: AC
  Administered 2024-04-26: 70 mg via SUBCUTANEOUS
  Filled 2024-04-26: qty 0.78

## 2024-04-26 MED ORDER — ZOLEDRONIC ACID 4 MG/100ML IV SOLN
4.0000 mg | Freq: Once | INTRAVENOUS | Status: AC
Start: 2024-04-26 — End: 2024-04-26
  Administered 2024-04-26: 4 mg via INTRAVENOUS
  Filled 2024-04-26: qty 100

## 2024-04-26 MED ORDER — SODIUM CHLORIDE 0.9% FLUSH
3.0000 mL | Freq: Once | INTRAVENOUS | Status: DC | PRN
Start: 2024-04-26 — End: 2024-04-26

## 2024-04-26 MED ORDER — HEPARIN SOD (PORK) LOCK FLUSH 100 UNIT/ML IV SOLN: 500.0000 [IU] | Freq: Once | INTRAVENOUS | Status: DC | PRN

## 2024-04-26 NOTE — Patient Instructions (Signed)
 [facility] - A DEPT OF [facility]  Discharge Instructions: Thank you for choosing [facility] to provide your oncology and hematology care.   If you have a lab appointment with the Cancer Center, please go directly to the Cancer Center and check in at the registration area.   Wear comfortable clothing and clothing appropriate for easy access to any Portacath or PICC line.   We strive to give you quality time with your provider. You may need to reschedule your appointment if you arrive late (15 or more minutes).  Arriving late affects you and other patients whose appointments are after yours.  Also, if you miss three or more appointments without notifying the office, you may be dismissed from the clinic at the provider's discretion.      For prescription refill requests, have your pharmacy contact our office and allow 72 hours for refills to be completed.    Today you received the following chemotherapy and/or immunotherapy agents tecvali      To help prevent nausea and vomiting after your treatment, we encourage you to take your nausea medication as directed.  BELOW ARE SYMPTOMS THAT SHOULD BE REPORTED IMMEDIATELY: *FEVER GREATER THAN 100.4 F (38 C) OR HIGHER *CHILLS OR SWEATING *NAUSEA AND VOMITING THAT IS NOT CONTROLLED WITH YOUR NAUSEA MEDICATION *UNUSUAL SHORTNESS OF BREATH *UNUSUAL BRUISING OR BLEEDING *URINARY PROBLEMS (pain or burning when urinating, or frequent urination) *BOWEL PROBLEMS (unusual diarrhea, constipation, pain near the anus) TENDERNESS IN MOUTH AND THROAT WITH OR WITHOUT PRESENCE OF ULCERS (sore throat, sores in mouth, or a toothache) UNUSUAL RASH, SWELLING OR PAIN  UNUSUAL VAGINAL DISCHARGE OR ITCHING   Items with * indicate a potential emergency and should be followed up as soon as possible or go to the Emergency Department if any problems should occur.  Please show the CHEMOTHERAPY ALERT CARD or IMMUNOTHERAPY  ALERT CARD at check-in to the Emergency Department and triage nurse.  Should you have questions after your visit or need to cancel or reschedule your appointment, please contact [facility] - A DEPT OF [facility]  Dept: [phone]  and follow the prompts.  Office hours are 8:00 a.m. to 4:30 p.m. Monday - Friday. Please note that voicemails left after 4:00 p.m. may not be returned until the following business day.  We are closed weekends and major holidays. You have access to a nurse at all times for urgent questions. Please call the main number to the clinic Dept: [phone] and follow the prompts.   For any non-urgent questions, you may also contact your provider using MyChart. We now offer e-Visits for anyone 91 and older to request care online for non-urgent symptoms. For details visit [email or web address].   Also download the MyChart app! Go to the app store, search MyChart, open the app, select , and log in with your MyChart username and password.

## 2024-04-26 NOTE — Progress Notes (Signed)
 Ok to proceed with Zometa  4mg  despite renal function per Dr. Onesimo.  Bolton Canupp, PharmD, MBA

## 2024-04-27 LAB — KAPPA/LAMBDA LIGHT CHAINS
Kappa free light chain: <0.7 mg/L — ABNORMAL LOW (ref 3.3–19.4)
Kappa, lambda light chain ratio: UNDETERMINED
Lambda free light chains: <1.5 mg/L — ABNORMAL LOW (ref 5.7–26.3)

## 2024-04-28 ENCOUNTER — Other Ambulatory Visit: Payer: Self-pay

## 2024-04-28 LAB — MULTIPLE MYELOMA PANEL, SERUM
Albumin SerPl Elph-Mcnc: 3.9 g/dL (ref 2.9–4.4)
Albumin/Glob SerPl: 2.5 — ABNORMAL HIGH (ref 0.7–1.7)
Alpha 1: 0.3 g/dL (ref 0.0–0.4)
Alpha2 Glob SerPl Elph-Mcnc: 0.5 g/dL (ref 0.4–1.0)
B-Globulin SerPl Elph-Mcnc: 0.8 g/dL (ref 0.7–1.3)
Gamma Glob SerPl Elph-Mcnc: 0.1 g/dL — ABNORMAL LOW (ref 0.4–1.8)
Globulin, Total: 1.6 g/dL — ABNORMAL LOW (ref 2.2–3.9)
IgA: <5 mg/dL — ABNORMAL LOW (ref 64–422)
IgG (Immunoglobin G), Serum: 160 mg/dL — ABNORMAL LOW (ref 586–1602)
IgM (Immunoglobulin M), Srm: <5 mg/dL — ABNORMAL LOW (ref 26–217)
Total Protein ELP: 5.5 g/dL — ABNORMAL LOW (ref 6.0–8.5)

## 2024-05-03 ENCOUNTER — Other Ambulatory Visit: Payer: Self-pay

## 2024-05-03 ENCOUNTER — Other Ambulatory Visit: Payer: Self-pay | Admitting: Pharmacy Technician

## 2024-05-03 NOTE — Progress Notes (Signed)
 Specialty Pharmacy Refill Coordination Note  Megan Olson is a 83 y.o. female contacted today regarding refills of specialty medication(s) Filgrastim -sndz (ZARXIO )   Patient requested [name] at [facility] at [place] date: 05/10/24   Medication will be filled on 05/09/24.   Message Sandi in teams to order med.

## 2024-05-10 ENCOUNTER — Inpatient Hospital Stay

## 2024-05-10 ENCOUNTER — Inpatient Hospital Stay: Attending: Physician Assistant

## 2024-05-10 ENCOUNTER — Inpatient Hospital Stay: Admitting: Hematology

## 2024-05-10 VITALS — BP 141/77 | HR 63 | Temp 97.7°F | Resp 18

## 2024-05-10 DIAGNOSIS — Z5111 Encounter for antineoplastic chemotherapy: Secondary | ICD-10-CM

## 2024-05-10 DIAGNOSIS — C9002 Multiple myeloma in relapse: Secondary | ICD-10-CM | POA: Insufficient documentation

## 2024-05-10 DIAGNOSIS — M8580 Other specified disorders of bone density and structure, unspecified site: Secondary | ICD-10-CM | POA: Diagnosis not present

## 2024-05-10 DIAGNOSIS — C9000 Multiple myeloma not having achieved remission: Secondary | ICD-10-CM

## 2024-05-10 DIAGNOSIS — Z79624 Long term (current) use of inhibitors of nucleotide synthesis: Secondary | ICD-10-CM | POA: Insufficient documentation

## 2024-05-10 DIAGNOSIS — Z79899 Other long term (current) drug therapy: Secondary | ICD-10-CM | POA: Diagnosis not present

## 2024-05-10 DIAGNOSIS — Z79890 Hormone replacement therapy (postmenopausal): Secondary | ICD-10-CM | POA: Insufficient documentation

## 2024-05-10 DIAGNOSIS — Z7982 Long term (current) use of aspirin: Secondary | ICD-10-CM | POA: Insufficient documentation

## 2024-05-10 DIAGNOSIS — G893 Neoplasm related pain (acute) (chronic): Secondary | ICD-10-CM | POA: Insufficient documentation

## 2024-05-10 DIAGNOSIS — Z7189 Other specified counseling: Secondary | ICD-10-CM

## 2024-05-10 DIAGNOSIS — I10 Essential (primary) hypertension: Secondary | ICD-10-CM | POA: Insufficient documentation

## 2024-05-10 DIAGNOSIS — Z860100 Personal history of colon polyps, unspecified: Secondary | ICD-10-CM | POA: Diagnosis not present

## 2024-05-10 DIAGNOSIS — Z85850 Personal history of malignant neoplasm of thyroid: Secondary | ICD-10-CM | POA: Insufficient documentation

## 2024-05-10 DIAGNOSIS — R21 Rash and other nonspecific skin eruption: Secondary | ICD-10-CM | POA: Insufficient documentation

## 2024-05-10 DIAGNOSIS — D649 Anemia, unspecified: Secondary | ICD-10-CM | POA: Insufficient documentation

## 2024-05-10 DIAGNOSIS — Z95828 Presence of other vascular implants and grafts: Secondary | ICD-10-CM

## 2024-05-10 LAB — CBC WITH DIFFERENTIAL (CANCER CENTER ONLY)
Basophils Absolute: 0.3 K/uL — ABNORMAL HIGH (ref 0.0–0.1)
Basophils Relative: 2 %
Eosinophils Absolute: 0.3 K/uL (ref 0.0–0.5)
Eosinophils Relative: 2 %
HCT: 29.4 % — ABNORMAL LOW (ref 36.0–46.0)
Hemoglobin: 9.5 g/dL — ABNORMAL LOW (ref 12.0–15.0)
Lymphocytes Relative: 2 %
Lymphs Abs: 0.3 K/uL — ABNORMAL LOW (ref 0.7–4.0)
MCH: 35.1 pg — ABNORMAL HIGH (ref 26.0–34.0)
MCHC: 32.3 g/dL (ref 30.0–36.0)
MCV: 108.5 fL — ABNORMAL HIGH (ref 80.0–100.0)
Monocytes Absolute: 0.5 K/uL (ref 0.1–1.0)
Monocytes Relative: 4 %
Neutro Abs: 12 K/uL — ABNORMAL HIGH (ref 1.7–7.7)
Neutrophils Relative %: 90 %
Platelet Count: 264 K/uL (ref 150–400)
RBC: 2.71 MIL/uL — ABNORMAL LOW (ref 3.87–5.11)
RDW: 16.3 % — ABNORMAL HIGH (ref 11.5–15.5)
WBC Count: 13.3 K/uL — ABNORMAL HIGH (ref 4.0–10.5)
nRBC: 0 % (ref 0.0–0.2)

## 2024-05-10 LAB — CMP (CANCER CENTER ONLY)
ALT: 17 U/L (ref 0–44)
AST: 20 U/L (ref 15–41)
Albumin: 4.5 g/dL (ref 3.5–5.0)
Alkaline Phosphatase: 70 U/L (ref 38–126)
Anion gap: 3 — ABNORMAL LOW (ref 5–15)
BUN: 17 mg/dL (ref 8–23)
CO2: 30 mmol/L (ref 22–32)
Calcium: 8.9 mg/dL (ref 8.9–10.3)
Chloride: 106 mmol/L (ref 98–111)
Creatinine: 0.66 mg/dL (ref 0.44–1.00)
GFR, Estimated: >60 mL/min (ref >60)
Glucose, Bld: 106 mg/dL — ABNORMAL HIGH (ref 70–99)
Potassium: 4.5 mmol/L (ref 3.5–5.1)
Sodium: 139 mmol/L (ref 135–145)
Total Bilirubin: 0.6 mg/dL (ref 0.0–1.2)
Total Protein: 5.9 g/dL — ABNORMAL LOW (ref 6.5–8.1)

## 2024-05-10 MED ORDER — SODIUM CHLORIDE 0.9% FLUSH
10.0000 mL | Freq: Once | INTRAVENOUS | Status: AC | PRN
  Administered 2024-05-10: 10 mL

## 2024-05-10 MED ORDER — TECLISTAMAB-CQYV CHEMO 153 MG/1.7ML SQ SOLN
1.5000 mg/kg | Freq: Once | SUBCUTANEOUS | Status: AC
  Administered 2024-05-10: 70 mg via SUBCUTANEOUS
  Filled 2024-05-10: qty 0.78

## 2024-05-11 ENCOUNTER — Encounter: Payer: Self-pay | Admitting: Hematology

## 2024-05-12 ENCOUNTER — Other Ambulatory Visit: Payer: Self-pay

## 2024-05-12 DIAGNOSIS — C9000 Multiple myeloma not having achieved remission: Secondary | ICD-10-CM

## 2024-05-15 ENCOUNTER — Other Ambulatory Visit: Payer: Self-pay

## 2024-05-15 DIAGNOSIS — C9000 Multiple myeloma not having achieved remission: Secondary | ICD-10-CM

## 2024-05-16 ENCOUNTER — Encounter: Payer: Self-pay | Admitting: Hematology

## 2024-05-16 MED ORDER — GABAPENTIN 100 MG PO CAPS: 200.0000 mg | ORAL_CAPSULE | Freq: Every day | ORAL | 0 refills | Status: DC

## 2024-05-16 MED ORDER — FENTANYL 12 MCG/HR TD PT72
1.0000 | MEDICATED_PATCH | TRANSDERMAL | 0 refills | Status: DC
Start: 2024-05-16 — End: 2024-06-13

## 2024-05-17 ENCOUNTER — Encounter: Payer: Self-pay | Admitting: Hematology

## 2024-05-17 NOTE — Progress Notes (Signed)
 [facility]   [phone] [phone]   Clinic Follow up Note   Patient Care Team: Katrinka Garnette KIDD, MD as PCP - General (Family Medicine) Onesimo Emaline Brink, MD as Consulting Physician (Hematology) Bond, Reyes Mcardle, MD as Referring Physician (Ophthalmology) Evern Nian, Rogue, MD as Consulting Physician (Hematology and Oncology) Trixie File, MD as Consulting Physician (Internal Medicine) Eletha Boas, MD as Consulting Physician (General Surgery) Nicholaus Sherlean CROME, [facility] (Inactive) as Pharmacist (Pharmacist)  Date of Service: .05/10/2024   CHIEF COMPLAINT:  Follow-up for continuation and management of multiple myeloma   SUMMARY OF ONCOLOGIC HISTORY: Oncology History  Multiple myeloma not having achieved remission (HCC)  12/12/2018 Initial Diagnosis   Multiple myeloma not having achieved remission (HCC)   12/20/2018 - 10/04/2019 Chemotherapy   The patient had dexamethasone  (DECADRON ) tablet 20 mg, 20 mg (100 % of original dose 20 mg), Oral, Once, 14 of 14 cycles Dose modification: 20 mg (original dose 20 mg, Cycle 1), 10 mg (original dose 20 mg, Cycle 14) Administration: 20 mg (12/20/2018), 20 mg (12/27/2018), 20 mg (01/10/2019), 20 mg (01/17/2019), 20 mg (01/31/2019), 20 mg (02/07/2019), 20 mg (03/14/2019), 20 mg (03/21/2019), 20 mg (02/21/2019), 20 mg (02/28/2019), 20 mg (04/04/2019), 20 mg (04/11/2019), 20 mg (04/25/2019), 20 mg (05/02/2019), 20 mg (05/16/2019), 20 mg (05/23/2019), 20 mg (06/06/2019), 20 mg (06/13/2019), 20 mg (06/27/2019), 20 mg (07/04/2019), 20 mg (07/18/2019), 20 mg (07/25/2019), 20 mg (08/08/2019), 20 mg (08/15/2019), 20 mg (08/29/2019), 20 mg (09/05/2019), 10 mg (09/19/2019) lenalidomide  (REVLIMID ) 15 MG capsule, 1 of 1 cycle, Start date: 01/18/2019, End date: 02/21/2019 bortezomib  SQ (VELCADE ) chemo injection 2 mg, 1.3 mg/m2 = 2 mg, Subcutaneous,  Once, 14 of 14 cycles Administration: 2 mg (12/20/2018), 2 mg (12/27/2018), 2 mg (12/23/2018), 2  mg (12/30/2018), 2 mg (01/10/2019), 2 mg (01/17/2019), 2 mg (01/13/2019), 2 mg (01/20/2019), 2 mg (01/31/2019), 2 mg (02/07/2019), 2 mg (02/03/2019), 2 mg (02/10/2019), 2 mg (03/14/2019), 2 mg (03/17/2019), 2 mg (03/21/2019), 2 mg (03/24/2019), 2 mg (02/21/2019), 2 mg (02/24/2019), 2 mg (02/28/2019), 2 mg (03/03/2019), 2 mg (04/04/2019), 2 mg (04/06/2019), 2 mg (04/11/2019), 2 mg (04/14/2019), 2 mg (04/25/2019), 2 mg (04/28/2019), 2 mg (05/02/2019), 2 mg (05/05/2019), 2 mg (05/16/2019), 2 mg (05/19/2019), 2 mg (05/23/2019), 2 mg (05/26/2019), 2 mg (06/06/2019), 2 mg (06/09/2019), 2 mg (06/13/2019), 2 mg (06/16/2019), 2 mg (06/27/2019), 2 mg (06/30/2019), 2 mg (07/04/2019), 2 mg (07/07/2019), 2 mg (07/18/2019), 2 mg (07/21/2019), 2 mg (07/25/2019), 2 mg (07/28/2019), 2 mg (08/08/2019), 2 mg (08/11/2019), 2 mg (08/15/2019), 2 mg (08/18/2019), 2 mg (08/29/2019), 2 mg (09/01/2019), 2 mg (09/05/2019), 2 mg (09/08/2019), 2 mg (09/19/2019), 2 mg (10/04/2019)  for chemotherapy treatment.    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   INTERVAL HISTORY:  Megan Olson is a 83 y.o. female returns for  continued valuation and management of her multiple myeloma and continues to be on maintenance by therapy with Teclistamab  every 2 weeks. No issues with acute infections since her last clinic visit. No acute toxicities from her bite therapy.  No new acute  shortness of breath or chest pain.  No new headaches or confusion.  No visual changes.  No acute skin rashes. Labs and today were discussed in detail with the patient.   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 No uterine cancer   IR IMAGING GUIDED PORT INSERTION  05/16/2021   IR RADIOLOGIST EVAL & MGMT  12/13/2018   THYROIDECTOMY N/A 08/02/2020   Procedure: TOTAL THYROIDECTOMY;  Surgeon: Eletha Boas, MD;  Location: [facility];  Service: General;  Laterality: N/A;   TONSILLECTOMY     age 32   I have reviewed the social history and family history with the patient and they are unchanged from previous note.  ALLERGIES:  is allergic to ace inhibitors, benadryl [diphenhydramine], diamox [acetazolamide], sulfamethoxazole, lenalidomide , and penicillins.  MEDICATIONS:  Current Outpatient Medications  Medication Sig Dispense Refill   acyclovir  (ZOVIRAX ) 400 MG tablet Take 1 tablet (400 mg total) by mouth 2 (two) times daily. 60 tablet 3   ALPHAGAN  P 0.1 % SOLN Place 1 drop into both eyes in the morning, at noon, and at bedtime.      amLODipine  (NORVASC ) 10 MG tablet Take 1 tablet (10 mg total) by mouth daily. 90 tablet 3   aspirin EC 81 MG tablet Take 81 mg by mouth daily.     Calcium  Carb-Cholecalciferol (CALCIUM  600 + D PO) Take by mouth.     Cholecalciferol (VITAMIN D3) 125 MCG (5000 UT) TABS Take 1,000 Units by mouth daily.     Co-Enzyme Q-10 100 MG CAPS Take 100 mg by mouth daily.     dapsone  100 MG  tablet Take 100 mg by mouth daily.     dorzolamide -timolol  (COSOPT ) 22.3-6.8 MG/ML ophthalmic solution Place 1 drop into both eyes 2 (two) times daily.   11   filgrastim -sndz (ZARXIO ) 300 MCG/0.5ML SOSY injection Inject 0.5 mLs (300 mcg total) into the skin 2 (two) times a week. On Monday and thursday 4 mL 2   levothyroxine  (SYNTHROID ) 88 MCG tablet Take 1 tablet (88 mcg total) by mouth daily before breakfast. 90 tablet 3   lidocaine -prilocaine  (EMLA ) cream Apply to affected area once 30 g 3   magnesium  chloride (SLOW-MAG) 64 MG TBEC SR tablet Take by mouth.     Multiple Vitamins-Minerals (MULTIVITAMIN WITH MINERALS) tablet Take 1 tablet by mouth daily. Centrum Silver 50 +     Netarsudil-Latanoprost  (ROCKLATAN) 0.02-0.005 % SOLN Place 1 drop into both eyes daily.     Omega-3 1000 MG CAPS Take 1,000 mg by mouth daily.      ondansetron  (ZOFRAN ) 8 MG tablet Take 1 tablet (8 mg total) by mouth every 8 (eight)  hours as needed for nausea or vomiting. 30 tablet 1   oxyCODONE -acetaminophen  (PERCOCET) 5-325 MG tablet Take 1-2 tablets by mouth every 4 (four) hours as needed for moderate pain or severe pain. 90 tablet 0   polyethylene glycol (MIRALAX ) packet Take 17 g by mouth daily. 30 each 1   prochlorperazine  (COMPAZINE ) 10 MG tablet Take 1 tablet (10 mg total) by mouth every 6 (six) hours as needed for nausea or vomiting. 30 tablet 1   senna-docusate (EQ STOOL SOFTENER/LAXATIVE) 8.6-50 MG tablet Take 2 tablets by mouth at bedtime. 60 tablet 2   Simethicone  (GAS-X PO) Take 1 tablet by mouth as needed.     teclistamab -cqyv SQ (TECVAYLI ) 153 MG/1.7ML Inject 1.5 mg/kg into the skin once.     vitamin C (ASCORBIC ACID) 500 MG tablet Take 500 mg by mouth 3 (three) times a week.     fentaNYL  (DURAGESIC ) 12 MCG/HR Place 1 patch onto the skin every 3 (three) days. 10 patch 0   gabapentin  (NEURONTIN ) 100 MG capsule Take 2 capsules (200 mg total) by mouth at bedtime. 60 capsule 0   No current facility-administered  medications for this visit.    PHYSICAL EXAMINATION:. .BP (!) 141/77 Comment: re-check  Pulse 63   Temp 97.7 F (36.5 C)   Resp 18   SpO2 92%  NAD GENERAL:alert, in no acute distress and comfortable SKIN: no acute rashes, no significant lesions EYES: conjunctiva are pink and non-injected, sclera anicteric OROPHARYNX: MMM, no exudates, no oropharyngeal erythema or ulceration NECK: supple, no JVD LYMPH:  no palpable lymphadenopathy in the cervical, axillary or inguinal regions LUNGS: clear to auscultation b/l with normal respiratory effort HEART: regular rate & rhythm ABDOMEN:  normoactive bowel sounds , non tender, not distended. Extremity: no pedal edema PSYCH: alert & oriented x 3 with fluent speech NEURO: no focal motor/sensory deficits   LABORATORY DATA:     Latest Ref Rng & Units 05/10/2024    9:20 AM 04/26/2024    9:21 AM 04/12/2024    8:45 AM  CBC  WBC 4.0 - 10.5 K/uL 13.3  12.9  10.5   Hemoglobin 12.0 - 15.0 g/dL 9.5  9.3  9.8   Hematocrit 36.0 - 46.0 % 29.4  28.3  29.6   Platelets 150 - 400 K/uL 264  236  219       Latest Ref Rng & Units 05/10/2024    9:20 AM 04/26/2024    9:21 AM 04/12/2024    8:45 AM  CMP  Glucose 70 - 99 mg/dL 893  99  899   BUN 8 - 23 mg/dL 17  18  14    Creatinine 0.44 - 1.00 mg/dL 9.33  9.33  9.31   Sodium 135 - 145 mmol/L 139  139  139   Potassium 3.5 - 5.1 mmol/L 4.5  4.5  4.2   Chloride 98 - 111 mmol/L 106  107  106   CO2 22 - 32 mmol/L 30  29  29    Calcium  8.9 - 10.3 mg/dL 8.9  8.8  9.1   Total Protein 6.5 - 8.1 g/dL 5.9  5.7  5.7   Total Bilirubin 0.0 - 1.2 mg/dL 0.6  0.6  0.6   Alkaline Phos 38 - 126 U/L 70  65  63   AST 15 - 41 U/L 20  19  18    ALT 0 - 44 U/L 17  16  14     Multiple Myeloma Panel (SPEP&IFE w/QIG) Order: [id number]  Status: Raul  Result - FINAL     Next appt: 05/24/2024 at 10:30 AM in Oncology [facility])     Dx: 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); Co...   Test Result Released: Yes (seen)   0 Result  Notes          Component Ref Range & Units (hover) 3 wk ago (04/26/24) 1 yr ago (05/18/23) 1 yr ago (04/20/23) 1 yr ago (03/16/23) 1 yr ago (02/16/23) 1 yr ago (01/19/23) 1 yr ago (12/22/22)  IgG (Immunoglobin G), Serum 160 Low  2,388 High  2,046 High  1,620 High  1,227 1,122 939  Comment: Result confirmed on concentration.  IgA <5 Low  18 Low  CM 25 Low  CM 27 Low  CM 28 Low  CM 31 Low  CM 34 Low  CM  Comment: Result confirmed on concentration.  IgM (Immunoglobulin M), Srm <5 Low  9 Low  CM 11 Low  CM 9 Low  CM 7 Low  CM 9 Low  CM 10 Low  CM  Comment: Result confirmed on concentration.  Total Protein ELP 5.5 Low  VC 7.1 VC 6.9 VC 6.6 VC 6.3 VC 6.4 VC 6.4 VC  Albumin  SerPl Elph-Mcnc 3.9 VC 3.6 VC 3.5 VC 3.7 VC 3.7 VC 3.7 VC 3.7 VC  Alpha 1 0.3 VC 0.3 VC 0.3 VC 0.2 VC 0.2 VC 0.3 VC 0.3 VC  Alpha2 Glob SerPl Elph-Mcnc 0.5 VC 0.7 VC 0.7 VC 0.8 VC 0.6 VC 0.7 VC 0.7 VC  B-Globulin SerPl Elph-Mcnc 0.8 VC 2.4 High  VC 2.3 High  VC 1.8 High  VC 0.7 VC 1.6 High  VC 1.5 High  VC  Gamma Glob SerPl Elph-Mcnc 0.1 Low  VC 0.1 Low  VC 0.2 Low  VC 0.1 Low  VC 1.1 VC 0.2 Low  VC 0.2 Low  VC  M Protein SerPl Elph-Mcnc Not Observed VC 1.7 High  VC 1.7 High  VC 1.2 High  VC 0.8 High  VC 0.7 High  VC 0.9 High      6  RADIOGRAPHIC STUDIES: I have personally reviewed the radiological images as listed and agreed with the findings in the report. No results found.   ASSESSMENT & PLAN:   Megan Olson is a 83 y.o. female who presents for a follow up for multiple myeloma.   1. 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 -Transitioned to PO Cytoxan /Velcade /Dex of each 21-day cycle in May 2020 -Transitioned to maintenance ixazomib therapy in  December 2020. -Due to progression of disease in July 2022, started Kyprolis /Dex therapy -Due to progression of disease in August 2023, switched to Dara/Pom/Dex -Due to progression of disease in September 2024, switched to elotuzumab /velcade /dex -Due to progression of disease in October 2024, switched to teclistamab  at [facility]. Tolerated step up therapy without CRS or ICANS.  -Cycle 8, Day 1 of Teclistamab  was delayed by one week 2/2 neutropenia, started on Zarxio  M/W/F of off weeks of therapy.  -Transitioned to Zarxio  Mon/Thurs weekly starting Cycle 9 of Teclistamab .   2.  Cancer related pain  -Currently well controlled -Continue fentanyl  patch at 12 mcg/h and Percocet for break through pain.    PLAN: - Patient's labs from today were discussed in detail  with her  CBC shows hemoglobin of 9.5 with a WBC count of 13.3k and platelets of 264k CMP stable Last myeloma labs from 04/26/2024 show no monoclonal protein spike and hypogammaglobinemia. - She appears to be getting neutrophilic.  Will cut down the Zarxio  to 300 mcg once weekly on Mondays and monitor her WBC count.  If it still remains stable with no neutropenia we will try to discontinue the Zarxio  -Continue on maintenance Tecvayli  every 2 weeks, due for Cycle 11, Day 1 today --Some anemia related to i dapsone  but Cannot Switch to Bactrim Due To Allergies - Continue acyclovir  for prophylaxis Continue vitamin D  Continue current pain management FOLLOW-UP: -Continue Teclistamab  as per orders every 2 weeks with labs and visit.  Continue Zometa  every 12 weeks  .The total time spent in the appointment was 30 minutes* .  All of the patient's questions were answered with apparent satisfaction. The patient knows to call the clinic with any problems, questions or concerns.   Emaline Saran MD MS AAHIVMS [facility] Hematology/Oncology Physician [facility]  .*Total Encounter Time as defined by the Centers  for Medicare and Medicaid Services includes, in addition to the face-to-face time of a patient visit (documented in the note above) non-face-to-face time: obtaining and reviewing outside history, ordering and reviewing medications, tests or procedures, care coordination (communications with other health care professionals or caregivers) and documentation in the medical record.

## 2024-05-24 ENCOUNTER — Inpatient Hospital Stay

## 2024-05-24 ENCOUNTER — Inpatient Hospital Stay: Admitting: Hematology

## 2024-05-24 ENCOUNTER — Encounter: Payer: Self-pay | Admitting: Hematology

## 2024-05-24 VITALS — BP 145/73 | HR 78 | Temp 97.9°F | Resp 18 | Wt 107.4 lb

## 2024-05-24 DIAGNOSIS — Z95828 Presence of other vascular implants and grafts: Secondary | ICD-10-CM

## 2024-05-24 DIAGNOSIS — C9000 Multiple myeloma not having achieved remission: Secondary | ICD-10-CM | POA: Diagnosis not present

## 2024-05-24 DIAGNOSIS — C9002 Multiple myeloma in relapse: Secondary | ICD-10-CM | POA: Diagnosis not present

## 2024-05-24 DIAGNOSIS — Z5111 Encounter for antineoplastic chemotherapy: Secondary | ICD-10-CM | POA: Diagnosis not present

## 2024-05-24 DIAGNOSIS — Z7189 Other specified counseling: Secondary | ICD-10-CM

## 2024-05-24 LAB — CBC WITH DIFFERENTIAL (CANCER CENTER ONLY)
Abs Immature Granulocytes: 0.8 K/uL — ABNORMAL HIGH (ref 0.00–0.07)
Basophils Absolute: 0.1 K/uL (ref 0.0–0.1)
Basophils Relative: 1 %
Eosinophils Absolute: 0.2 K/uL (ref 0.0–0.5)
Eosinophils Relative: 2 %
HCT: 28 % — ABNORMAL LOW (ref 36.0–46.0)
Hemoglobin: 9.2 g/dL — ABNORMAL LOW (ref 12.0–15.0)
Immature Granulocytes: 8 %
Lymphocytes Relative: 2 %
Lymphs Abs: 0.2 K/uL — ABNORMAL LOW (ref 0.7–4.0)
MCH: 35 pg — ABNORMAL HIGH (ref 26.0–34.0)
MCHC: 32.9 g/dL (ref 30.0–36.0)
MCV: 106.5 fL — ABNORMAL HIGH (ref 80.0–100.0)
Monocytes Absolute: 0.7 K/uL (ref 0.1–1.0)
Monocytes Relative: 7 %
Neutro Abs: 7.9 K/uL — ABNORMAL HIGH (ref 1.7–7.7)
Neutrophils Relative %: 80 %
Platelet Count: 257 K/uL (ref 150–400)
RBC: 2.63 MIL/uL — ABNORMAL LOW (ref 3.87–5.11)
RDW: 14.9 % (ref 11.5–15.5)
Smear Review: NORMAL
WBC Count: 9.9 K/uL (ref 4.0–10.5)
nRBC: 0 % (ref 0.0–0.2)

## 2024-05-24 LAB — CMP (CANCER CENTER ONLY)
ALT: 15 U/L (ref 0–44)
AST: 19 U/L (ref 15–41)
Albumin: 4.2 g/dL (ref 3.5–5.0)
Alkaline Phosphatase: 64 U/L (ref 38–126)
Anion gap: 5 (ref 5–15)
BUN: 16 mg/dL (ref 8–23)
CO2: 27 mmol/L (ref 22–32)
Calcium: 8.6 mg/dL — ABNORMAL LOW (ref 8.9–10.3)
Chloride: 106 mmol/L (ref 98–111)
Creatinine: 0.57 mg/dL (ref 0.44–1.00)
GFR, Estimated: >60 mL/min (ref >60)
Glucose, Bld: 98 mg/dL (ref 70–99)
Potassium: 4.4 mmol/L (ref 3.5–5.1)
Sodium: 138 mmol/L (ref 135–145)
Total Bilirubin: 0.5 mg/dL (ref 0.0–1.2)
Total Protein: 5.6 g/dL — ABNORMAL LOW (ref 6.5–8.1)

## 2024-05-24 MED ORDER — SODIUM CHLORIDE 0.9% FLUSH
10.0000 mL | Freq: Once | INTRAVENOUS | Status: AC | PRN
Start: 2024-05-24 — End: 2024-05-24
  Administered 2024-05-24: 10 mL

## 2024-05-24 MED ORDER — TECLISTAMAB-CQYV CHEMO 153 MG/1.7ML SQ SOLN
1.5000 mg/kg | Freq: Once | SUBCUTANEOUS | Status: AC
  Administered 2024-05-24: 70 mg via SUBCUTANEOUS
  Filled 2024-05-24: qty 0.78

## 2024-05-24 NOTE — Patient Instructions (Signed)
 [facility] - A DEPT OF [facility]  Discharge Instructions: Thank you for choosing [facility] to provide your oncology and hematology care.   If you have a lab appointment with the Cancer Center, please go directly to the Cancer Center and check in at the registration area.   Wear comfortable clothing and clothing appropriate for easy access to any Portacath or PICC line.   We strive to give you quality time with your provider. You may need to reschedule your appointment if you arrive late (15 or more minutes).  Arriving late affects you and other patients whose appointments are after yours.  Also, if you miss three or more appointments without notifying the office, you may be dismissed from the clinic at the provider's discretion.      For prescription refill requests, have your pharmacy contact our office and allow 72 hours for refills to be completed.    Today you received the following chemotherapy and/or immunotherapy agents: Tecvayli       To help prevent nausea and vomiting after your treatment, we encourage you to take your nausea medication as directed.  BELOW ARE SYMPTOMS THAT SHOULD BE REPORTED IMMEDIATELY: *FEVER GREATER THAN 100.4 F (38 C) OR HIGHER *CHILLS OR SWEATING *NAUSEA AND VOMITING THAT IS NOT CONTROLLED WITH YOUR NAUSEA MEDICATION *UNUSUAL SHORTNESS OF BREATH *UNUSUAL BRUISING OR BLEEDING *URINARY PROBLEMS (pain or burning when urinating, or frequent urination) *BOWEL PROBLEMS (unusual diarrhea, constipation, pain near the anus) TENDERNESS IN MOUTH AND THROAT WITH OR WITHOUT PRESENCE OF ULCERS (sore throat, sores in mouth, or a toothache) UNUSUAL RASH, SWELLING OR PAIN  UNUSUAL VAGINAL DISCHARGE OR ITCHING   Items with * indicate a potential emergency and should be followed up as soon as possible or go to the Emergency Department if any problems should occur.  Please show the CHEMOTHERAPY ALERT CARD or IMMUNOTHERAPY  ALERT CARD at check-in to the Emergency Department and triage nurse.  Should you have questions after your visit or need to cancel or reschedule your appointment, please contact [facility] - A DEPT OF [facility]  Dept: [phone]  and follow the prompts.  Office hours are 8:00 a.m. to 4:30 p.m. Monday - Friday. Please note that voicemails left after 4:00 p.m. may not be returned until the following business day.  We are closed weekends and major holidays. You have access to a nurse at all times for urgent questions. Please call the main number to the clinic Dept: [phone] and follow the prompts.   For any non-urgent questions, you may also contact your provider using MyChart. We now offer e-Visits for anyone 31 and older to request care online for non-urgent symptoms. For details visit [email or web address].   Also download the MyChart app! Go to the app store, search "MyChart", open the app, select [place], and log in with your MyChart username and password.

## 2024-05-25 ENCOUNTER — Encounter: Payer: Self-pay | Admitting: Hematology

## 2024-05-25 LAB — KAPPA/LAMBDA LIGHT CHAINS
Kappa free light chain: <0.7 mg/L — ABNORMAL LOW (ref 3.3–19.4)
Kappa, lambda light chain ratio: UNDETERMINED
Lambda free light chains: <1.5 mg/L — ABNORMAL LOW (ref 5.7–26.3)

## 2024-05-28 LAB — MULTIPLE MYELOMA PANEL, SERUM
Albumin SerPl Elph-Mcnc: 3.7 g/dL (ref 2.9–4.4)
Albumin/Glob SerPl: 2.4 — ABNORMAL HIGH (ref 0.7–1.7)
Alpha 1: 0.3 g/dL (ref 0.0–0.4)
Alpha2 Glob SerPl Elph-Mcnc: 0.5 g/dL (ref 0.4–1.0)
B-Globulin SerPl Elph-Mcnc: 0.8 g/dL (ref 0.7–1.3)
Gamma Glob SerPl Elph-Mcnc: 0 g/dL — ABNORMAL LOW (ref 0.4–1.8)
Globulin, Total: 1.6 g/dL — ABNORMAL LOW (ref 2.2–3.9)
IgA: <5 mg/dL — ABNORMAL LOW (ref 64–422)
IgG (Immunoglobin G), Serum: 128 mg/dL — ABNORMAL LOW (ref 586–1602)
IgM (Immunoglobulin M), Srm: <5 mg/dL — ABNORMAL LOW (ref 26–217)
Total Protein ELP: 5.3 g/dL — ABNORMAL LOW (ref 6.0–8.5)

## 2024-05-29 ENCOUNTER — Other Ambulatory Visit: Payer: Self-pay

## 2024-05-29 DIAGNOSIS — C9002 Multiple myeloma in relapse: Secondary | ICD-10-CM

## 2024-05-29 MED ORDER — SENNOSIDES-DOCUSATE SODIUM 8.6-50 MG PO TABS: 1.0000 | ORAL_TABLET | Freq: Every day | ORAL | 2 refills | Status: AC

## 2024-05-31 ENCOUNTER — Encounter: Payer: Self-pay | Admitting: Hematology

## 2024-05-31 MED ORDER — DAPSONE 100 MG PO TABS: 100.0000 mg | ORAL_TABLET | Freq: Every day | ORAL | 3 refills | Status: DC

## 2024-05-31 NOTE — Progress Notes (Signed)
 [facility]   [phone] [phone]   Clinic Follow up Note   Patient Care Team: Katrinka Garnette KIDD, MD as PCP - General (Family Medicine) Onesimo Emaline Brink, MD as Consulting Physician (Hematology) Bond, Reyes Mcardle, MD as Referring Physician (Ophthalmology) Evern Nian, Rogue, MD as Consulting Physician (Hematology and Oncology) Trixie File, MD as Consulting Physician (Internal Medicine) Eletha Boas, MD as Consulting Physician (General Surgery) Nicholaus Sherlean CROME, [facility] (Inactive) as Pharmacist (Pharmacist)  Date of Service: .05/24/2024   CHIEF COMPLAINT:  Follow-up for continuation and management of multiple myeloma   SUMMARY OF ONCOLOGIC HISTORY: Oncology History  Multiple myeloma not having achieved remission (HCC)  12/12/2018 Initial Diagnosis   Multiple myeloma not having achieved remission (HCC)   12/20/2018 - 10/04/2019 Chemotherapy   The patient had dexamethasone  (DECADRON ) tablet 20 mg, 20 mg (100 % of original dose 20 mg), Oral, Once, 14 of 14 cycles Dose modification: 20 mg (original dose 20 mg, Cycle 1), 10 mg (original dose 20 mg, Cycle 14) Administration: 20 mg (12/20/2018), 20 mg (12/27/2018), 20 mg (01/10/2019), 20 mg (01/17/2019), 20 mg (01/31/2019), 20 mg (02/07/2019), 20 mg (03/14/2019), 20 mg (03/21/2019), 20 mg (02/21/2019), 20 mg (02/28/2019), 20 mg (04/04/2019), 20 mg (04/11/2019), 20 mg (04/25/2019), 20 mg (05/02/2019), 20 mg (05/16/2019), 20 mg (05/23/2019), 20 mg (06/06/2019), 20 mg (06/13/2019), 20 mg (06/27/2019), 20 mg (07/04/2019), 20 mg (07/18/2019), 20 mg (07/25/2019), 20 mg (08/08/2019), 20 mg (08/15/2019), 20 mg (08/29/2019), 20 mg (09/05/2019), 10 mg (09/19/2019) lenalidomide  (REVLIMID ) 15 MG capsule, 1 of 1 cycle, Start date: 01/18/2019, End date: 02/21/2019 bortezomib  SQ (VELCADE ) chemo injection 2 mg, 1.3 mg/m2 = 2 mg, Subcutaneous,  Once, 14 of 14 cycles Administration: 2 mg (12/20/2018), 2 mg (12/27/2018), 2 mg (12/23/2018), 2  mg (12/30/2018), 2 mg (01/10/2019), 2 mg (01/17/2019), 2 mg (01/13/2019), 2 mg (01/20/2019), 2 mg (01/31/2019), 2 mg (02/07/2019), 2 mg (02/03/2019), 2 mg (02/10/2019), 2 mg (03/14/2019), 2 mg (03/17/2019), 2 mg (03/21/2019), 2 mg (03/24/2019), 2 mg (02/21/2019), 2 mg (02/24/2019), 2 mg (02/28/2019), 2 mg (03/03/2019), 2 mg (04/04/2019), 2 mg (04/06/2019), 2 mg (04/11/2019), 2 mg (04/14/2019), 2 mg (04/25/2019), 2 mg (04/28/2019), 2 mg (05/02/2019), 2 mg (05/05/2019), 2 mg (05/16/2019), 2 mg (05/19/2019), 2 mg (05/23/2019), 2 mg (05/26/2019), 2 mg (06/06/2019), 2 mg (06/09/2019), 2 mg (06/13/2019), 2 mg (06/16/2019), 2 mg (06/27/2019), 2 mg (06/30/2019), 2 mg (07/04/2019), 2 mg (07/07/2019), 2 mg (07/18/2019), 2 mg (07/21/2019), 2 mg (07/25/2019), 2 mg (07/28/2019), 2 mg (08/08/2019), 2 mg (08/11/2019), 2 mg (08/15/2019), 2 mg (08/18/2019), 2 mg (08/29/2019), 2 mg (09/01/2019), 2 mg (09/05/2019), 2 mg (09/08/2019), 2 mg (09/19/2019), 2 mg (10/04/2019)  for chemotherapy treatment.    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   INTERVAL HISTORY:  KYIRA VOLKERT is a 83 y.o. female r returns  for continued evaluation and management of her multiple myeloma and every 2 weekly Teclistamab . She has had normal to slightly elevated WBC counts and her Zarxio  was reduced to once weekly after her last visit.  Her WBC count today remain normal at 9.9k with an ANC  of 7.9k and she was recommended to discontinue her Zarxio . She notes no fevers no chills no night sweats no unexpected weight loss.  No new confusion or CNS symptomatology.  No new shortness of breath or chest pain.  No new skin rashes.   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 No uterine cancer   IR IMAGING GUIDED PORT INSERTION  05/16/2021   IR RADIOLOGIST EVAL & MGMT  12/13/2018   THYROIDECTOMY N/A 08/02/2020   Procedure: TOTAL THYROIDECTOMY;  Surgeon: Eletha Boas, MD;  Location: [facility];  Service: General;  Laterality: N/A;   TONSILLECTOMY     age 34   I have reviewed the social history and family history with the patient and they are unchanged from previous note.  ALLERGIES:  is allergic to ace inhibitors, benadryl [diphenhydramine], diamox [acetazolamide], sulfamethoxazole, lenalidomide , and penicillins.  MEDICATIONS:  Current Outpatient Medications  Medication Sig Dispense Refill   acyclovir  (ZOVIRAX ) 400 MG tablet Take 1 tablet (400 mg total) by mouth 2 (two) times daily. 60 tablet 3   ALPHAGAN  P 0.1 % SOLN Place 1 drop into both eyes in the morning, at noon, and at bedtime.      amLODipine  (NORVASC ) 10 MG tablet Take 1 tablet (10 mg total) by mouth daily. 90 tablet 3   aspirin EC 81 MG tablet Take 81 mg by mouth daily.     Calcium  Carb-Cholecalciferol (CALCIUM  600 + D PO) Take by mouth.     Cholecalciferol (VITAMIN D3) 125 MCG (5000 UT) TABS Take 1,000 Units by mouth daily.     Co-Enzyme  Q-10 100 MG CAPS Take 100 mg by mouth daily.     dapsone  100 MG tablet Take 100 mg by mouth daily.     dorzolamide -timolol  (COSOPT ) 22.3-6.8 MG/ML ophthalmic solution Place 1 drop into both eyes 2 (two) times daily.   11   fentaNYL  (DURAGESIC ) 12 MCG/HR Place 1 patch onto the skin every 3 (three) days. 10 patch 0   filgrastim -sndz (ZARXIO ) 300 MCG/0.5ML SOSY injection Inject 0.5 mLs (300 mcg total) into the skin 2 (two) times a week. On Monday and thursday 4 mL 2   gabapentin  (NEURONTIN ) 100 MG capsule Take 2 capsules (200 mg total) by mouth at bedtime. 60 capsule 0   levothyroxine  (SYNTHROID ) 88 MCG tablet Take 1 tablet (88 mcg total) by mouth daily before breakfast. 90 tablet 3   lidocaine -prilocaine  (EMLA ) cream Apply to affected area once 30 g 3   magnesium  chloride (SLOW-MAG) 64 MG TBEC SR tablet Take by mouth.     Multiple Vitamins-Minerals (MULTIVITAMIN WITH MINERALS) tablet Take 1 tablet by  mouth daily. Centrum Silver 50 +     Netarsudil-Latanoprost  (ROCKLATAN) 0.02-0.005 % SOLN Place 1 drop into both eyes daily.     Omega-3 1000 MG CAPS Take 1,000 mg by mouth daily.      ondansetron  (ZOFRAN ) 8 MG tablet Take 1 tablet (8 mg total) by mouth every 8 (eight) hours as needed for nausea or vomiting. 30 tablet 1   oxyCODONE -acetaminophen  (PERCOCET) 5-325 MG tablet Take 1-2 tablets by mouth every 4 (four) hours as needed for moderate pain or severe pain. 90 tablet 0   polyethylene glycol (MIRALAX ) packet Take 17 g by mouth daily. 30 each 1   prochlorperazine  (COMPAZINE ) 10 MG tablet Take 1 tablet (10 mg total) by mouth every 6 (six) hours as needed for nausea or vomiting. 30 tablet 1   Simethicone  (GAS-X PO) Take 1 tablet by mouth as needed.     teclistamab -cqyv SQ (TECVAYLI ) 153 MG/1.7ML Inject 1.5 mg/kg into the skin once.     vitamin C (ASCORBIC ACID) 500 MG tablet Take 500 mg by mouth 3 (three) times a week.     senna-docusate (EQ STOOL SOFTENER/LAXATIVE) 8.6-50 MG tablet Take 1 tablet by  mouth at bedtime. 60 tablet 2   No current facility-administered medications for this visit.    PHYSICAL EXAMINATION:. .BP (!) 145/73 Comment: re-check  Pulse 78   Temp 97.9 F (36.6 C)   Resp 18   Wt 107 lb 6.4 oz (48.7 kg)   SpO2 93%   BMI 20.98 kg/m  . GENERAL:alert, in no acute distress and comfortable SKIN: no acute rashes, no significant lesions EYES: conjunctiva are pink and non-injected, sclera anicteric OROPHARYNX: MMM, no exudates, no oropharyngeal erythema or ulceration NECK: supple, no JVD LYMPH:  no palpable lymphadenopathy in the cervical, axillary or inguinal regions LUNGS: clear to auscultation b/l with normal respiratory effort HEART: regular rate & rhythm ABDOMEN:  normoactive bowel sounds , non tender, not distended. Extremity: no pedal edema PSYCH: alert & oriented x 3 with fluent speech NEURO: no focal motor/sensory deficits  LABORATORY DATA:     Latest Ref Rng & Units 05/24/2024   10:39 AM 05/10/2024    9:20 AM 04/26/2024    9:21 AM  CBC  WBC 4.0 - 10.5 K/uL 9.9  13.3  12.9   Hemoglobin 12.0 - 15.0 g/dL 9.2  9.5  9.3   Hematocrit 36.0 - 46.0 % 28.0  29.4  28.3   Platelets 150 - 400 K/uL 257  264  236       Latest Ref Rng & Units 05/24/2024   10:39 AM 05/10/2024    9:20 AM 04/26/2024    9:21 AM  CMP  Glucose 70 - 99 mg/dL 98  893  99   BUN 8 - 23 mg/dL 16  17  18    Creatinine 0.44 - 1.00 mg/dL 9.42  9.33  9.33   Sodium 135 - 145 mmol/L 138  139  139   Potassium 3.5 - 5.1 mmol/L 4.4  4.5  4.5   Chloride 98 - 111 mmol/L 106  106  107   CO2 22 - 32 mmol/L 27  30  29    Calcium  8.9 - 10.3 mg/dL 8.6  8.9  8.8   Total Protein 6.5 - 8.1 g/dL 5.6  5.9  5.7   Total Bilirubin 0.0 - 1.2 mg/dL 0.5  0.6  0.6   Alkaline Phos 38 - 126 U/L 64  70  65   AST 15 - 41 U/L 19  20  19   ALT 0 - 44 U/L 15  17  16     Multiple Myeloma Panel (SPEP&IFE w/QIG) Order: [id number]  Status: Edited Result - FINAL     Next appt: 05/24/2024 at 10:30 AM in [facility]  [facility])     Dx: 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); Co...   Test Result Released: Yes (seen)   0 Result Notes          Component Ref Range & Units (hover) 3 wk ago (04/26/24) 1 yr ago (05/18/23) 1 yr ago (04/20/23) 1 yr ago (03/16/23) 1 yr ago (02/16/23) 1 yr ago (01/19/23) 1 yr ago (12/22/22)  IgG (Immunoglobin G), Serum 160 Low  2,388 High  2,046 High  1,620 High  1,227 1,122 939  Comment: Result confirmed on concentration.  IgA <5 Low  18 Low  CM 25 Low  CM 27 Low  CM 28 Low  CM 31 Low  CM 34 Low  CM  Comment: Result confirmed on concentration.  IgM (Immunoglobulin M), Srm <5 Low  9 Low  CM 11 Low  CM 9 Low  CM 7 Low  CM 9 Low  CM 10 Low  CM  Comment: Result confirmed on concentration.  Total Protein ELP 5.5 Low  VC 7.1 VC 6.9 VC 6.6 VC 6.3 VC 6.4 VC 6.4 VC  Albumin  SerPl Elph-Mcnc 3.9 VC 3.6 VC 3.5 VC 3.7 VC 3.7 VC 3.7 VC 3.7 VC  Alpha 1 0.3 VC 0.3 VC 0.3 VC 0.2 VC 0.2 VC 0.3 VC 0.3 VC  Alpha2 Glob SerPl Elph-Mcnc 0.5 VC 0.7 VC 0.7 VC 0.8 VC 0.6 VC 0.7 VC 0.7 VC  B-Globulin SerPl Elph-Mcnc 0.8 VC 2.4 High  VC 2.3 High  VC 1.8 High  VC 0.7 VC 1.6 High  VC 1.5 High  VC  Gamma Glob SerPl Elph-Mcnc 0.1 Low  VC 0.1 Low  VC 0.2 Low  VC 0.1 Low  VC 1.1 VC 0.2 Low  VC 0.2 Low  VC  M Protein SerPl Elph-Mcnc Not Observed VC 1.7 High  VC 1.7 High  VC 1.2 High  VC 0.8 High  VC 0.7 High  VC 0.9 High      6  RADIOGRAPHIC STUDIES: I have personally reviewed the radiological images as listed and agreed with the findings in the report. No results found.   ASSESSMENT & PLAN:   Megan Olson is a 83 y.o. female who presents for a follow up for multiple myeloma.   1. 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 -Transitioned to PO Cytoxan /Velcade /Dex of each 21-day cycle in May 2020 -Transitioned to maintenance ixazomib therapy in December 2020. -Due to progression of disease in July 2022, started Kyprolis /Dex therapy -Due to progression of disease in August 2023, switched to Dara/Pom/Dex -Due to progression of disease in September 2024, switched to elotuzumab /velcade /dex -Due to progression of disease in October 2024, switched to teclistamab  at [facility]. Tolerated step up therapy without CRS or ICANS.  -Cycle 8, Day 1 of Teclistamab  was delayed by one week 2/2 neutropenia, started on Zarxio  M/W/F of off weeks of therapy.  -Transitioned to Zarxio  Mon/Thurs weekly starting Cycle 9 of Teclistamab .   2.  Cancer related pain  -  Currently well controlled -Continue fentanyl  patch at 12 mcg/h and Percocet for break through pain.    PLAN: - Patient's labs from today were discussed in detail with her CBC is stable with a hemoglobin of 9.2 normal WBC count of 9.9 with an ANC of 7.9k and platelets of 257k. 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  Will need to continue to monitor for infections and have low threshold to start IVIG. Serum kappa lambda free light chains are undetectable at this time. We discontinued the patient's Zarxio  today and we will monitor her blood counts Patient notes no new toxicities from her Tecvayli  -She will continue maintenance Tecvayli  at every 2 weeks and patient is appropriate to proceed with treatment with the same supportive medications today Continue dapsone  for PJP prophylaxis Continue acyclovir  Continue vitamin D  Continue current pain management FOLLOW-UP: -Continue Teclistamab  as per orders every 2 weeks with labs and visit.  Continue Zometa  every 12 weeks MD visit in 4 weeks  The total time spent in the  appointment was 30 minutes*.  All of the patient's questions were answered with apparent satisfaction. The patient knows to call the clinic with any problems, questions or concerns.   Emaline Saran MD MS AAHIVMS [facility] Hematology/Oncology Physician [facility]  .*Total Encounter Time as defined by the Centers for Medicare and Medicaid Services includes, in addition to the face-to-face time of a patient visit (documented in the note above) non-face-to-face time: obtaining and reviewing outside history, ordering and reviewing medications, tests or procedures, care coordination (communications with other health care professionals or caregivers) and documentation in the medical record.

## 2024-06-01 ENCOUNTER — Other Ambulatory Visit (HOSPITAL_COMMUNITY): Payer: Self-pay

## 2024-06-02 ENCOUNTER — Other Ambulatory Visit: Payer: Self-pay

## 2024-06-02 ENCOUNTER — Other Ambulatory Visit (HOSPITAL_COMMUNITY): Payer: Self-pay

## 2024-06-02 DIAGNOSIS — C9002 Multiple myeloma in relapse: Secondary | ICD-10-CM

## 2024-06-02 MED ORDER — DOXYCYCLINE HYCLATE 100 MG PO TABS
100.0000 mg | ORAL_TABLET | Freq: Two times a day (BID) | ORAL | 0 refills | Status: DC
Start: 2024-06-02 — End: 2024-06-21

## 2024-06-02 NOTE — Progress Notes (Signed)
 Disenrolling - patient called to let pharmacy know she will no longer be taking the Zarxio  and still has around 6 injections on hand. Confirmed that med discontinued per 08.20.25 office visit notes.

## 2024-06-06 ENCOUNTER — Other Ambulatory Visit: Payer: Self-pay

## 2024-06-07 ENCOUNTER — Inpatient Hospital Stay: Attending: Hematology

## 2024-06-07 ENCOUNTER — Inpatient Hospital Stay

## 2024-06-07 ENCOUNTER — Encounter: Payer: Self-pay | Admitting: Hematology

## 2024-06-07 VITALS — BP 152/85 | HR 63 | Temp 97.7°F | Resp 18 | Wt 106.8 lb

## 2024-06-07 DIAGNOSIS — Z79899 Other long term (current) drug therapy: Secondary | ICD-10-CM | POA: Insufficient documentation

## 2024-06-07 DIAGNOSIS — R5383 Other fatigue: Secondary | ICD-10-CM | POA: Diagnosis not present

## 2024-06-07 DIAGNOSIS — Z95828 Presence of other vascular implants and grafts: Secondary | ICD-10-CM

## 2024-06-07 DIAGNOSIS — Z85850 Personal history of malignant neoplasm of thyroid: Secondary | ICD-10-CM | POA: Insufficient documentation

## 2024-06-07 DIAGNOSIS — Z79624 Long term (current) use of inhibitors of nucleotide synthesis: Secondary | ICD-10-CM | POA: Insufficient documentation

## 2024-06-07 DIAGNOSIS — R0789 Other chest pain: Secondary | ICD-10-CM | POA: Insufficient documentation

## 2024-06-07 DIAGNOSIS — Z7189 Other specified counseling: Secondary | ICD-10-CM

## 2024-06-07 DIAGNOSIS — C9002 Multiple myeloma in relapse: Secondary | ICD-10-CM | POA: Insufficient documentation

## 2024-06-07 DIAGNOSIS — Z860101 Personal history of adenomatous and serrated colon polyps: Secondary | ICD-10-CM | POA: Insufficient documentation

## 2024-06-07 DIAGNOSIS — R059 Cough, unspecified: Secondary | ICD-10-CM | POA: Diagnosis not present

## 2024-06-07 DIAGNOSIS — Z5111 Encounter for antineoplastic chemotherapy: Secondary | ICD-10-CM | POA: Insufficient documentation

## 2024-06-07 DIAGNOSIS — G893 Neoplasm related pain (acute) (chronic): Secondary | ICD-10-CM | POA: Diagnosis not present

## 2024-06-07 DIAGNOSIS — M549 Dorsalgia, unspecified: Secondary | ICD-10-CM | POA: Insufficient documentation

## 2024-06-07 DIAGNOSIS — Z79890 Hormone replacement therapy (postmenopausal): Secondary | ICD-10-CM | POA: Insufficient documentation

## 2024-06-07 DIAGNOSIS — M8580 Other specified disorders of bone density and structure, unspecified site: Secondary | ICD-10-CM | POA: Insufficient documentation

## 2024-06-07 DIAGNOSIS — Z7982 Long term (current) use of aspirin: Secondary | ICD-10-CM | POA: Diagnosis not present

## 2024-06-07 DIAGNOSIS — D649 Anemia, unspecified: Secondary | ICD-10-CM | POA: Diagnosis not present

## 2024-06-07 DIAGNOSIS — C9000 Multiple myeloma not having achieved remission: Secondary | ICD-10-CM

## 2024-06-07 DIAGNOSIS — Z801 Family history of malignant neoplasm of trachea, bronchus and lung: Secondary | ICD-10-CM | POA: Diagnosis not present

## 2024-06-07 DIAGNOSIS — I10 Essential (primary) hypertension: Secondary | ICD-10-CM | POA: Insufficient documentation

## 2024-06-07 DIAGNOSIS — R0602 Shortness of breath: Secondary | ICD-10-CM | POA: Diagnosis not present

## 2024-06-07 LAB — CBC WITH DIFFERENTIAL (CANCER CENTER ONLY)
Abs Immature Granulocytes: 0.05 K/uL (ref 0.00–0.07)
Basophils Absolute: 0.1 K/uL (ref 0.0–0.1)
Basophils Relative: 3 %
Eosinophils Absolute: 0.2 K/uL (ref 0.0–0.5)
Eosinophils Relative: 4 %
HCT: 29 % — ABNORMAL LOW (ref 36.0–46.0)
Hemoglobin: 9.6 g/dL — ABNORMAL LOW (ref 12.0–15.0)
Immature Granulocytes: 1 %
Lymphocytes Relative: 6 %
Lymphs Abs: 0.3 K/uL — ABNORMAL LOW (ref 0.7–4.0)
MCH: 34.8 pg — ABNORMAL HIGH (ref 26.0–34.0)
MCHC: 33.1 g/dL (ref 30.0–36.0)
MCV: 105.1 fL — ABNORMAL HIGH (ref 80.0–100.0)
Monocytes Absolute: 0.5 K/uL (ref 0.1–1.0)
Monocytes Relative: 11 %
Neutro Abs: 3.6 K/uL (ref 1.7–7.7)
Neutrophils Relative %: 75 %
Platelet Count: 262 K/uL (ref 150–400)
RBC: 2.76 MIL/uL — ABNORMAL LOW (ref 3.87–5.11)
RDW: 14.7 % (ref 11.5–15.5)
WBC Count: 4.8 K/uL (ref 4.0–10.5)
nRBC: 0 % (ref 0.0–0.2)

## 2024-06-07 LAB — CMP (CANCER CENTER ONLY)
ALT: 21 U/L (ref 0–44)
AST: 22 U/L (ref 15–41)
Albumin: 4.2 g/dL (ref 3.5–5.0)
Alkaline Phosphatase: 55 U/L (ref 38–126)
Anion gap: 6 (ref 5–15)
BUN: 15 mg/dL (ref 8–23)
CO2: 27 mmol/L (ref 22–32)
Calcium: 9 mg/dL (ref 8.9–10.3)
Chloride: 105 mmol/L (ref 98–111)
Creatinine: 0.55 mg/dL (ref 0.44–1.00)
GFR, Estimated: >60 mL/min (ref >60)
Glucose, Bld: 103 mg/dL — ABNORMAL HIGH (ref 70–99)
Potassium: 4.4 mmol/L (ref 3.5–5.1)
Sodium: 138 mmol/L (ref 135–145)
Total Bilirubin: 0.6 mg/dL (ref 0.0–1.2)
Total Protein: 5.9 g/dL — ABNORMAL LOW (ref 6.5–8.1)

## 2024-06-07 MED ORDER — SODIUM CHLORIDE 0.9% FLUSH
10.0000 mL | Freq: Once | INTRAVENOUS | Status: AC | PRN
  Administered 2024-06-07: 10 mL

## 2024-06-07 MED ORDER — TECLISTAMAB-CQYV CHEMO 153 MG/1.7ML SQ SOLN
1.5000 mg/kg | Freq: Once | SUBCUTANEOUS | Status: AC
  Administered 2024-06-07: 70 mg via SUBCUTANEOUS
  Filled 2024-06-07: qty 0.78

## 2024-06-07 NOTE — Patient Instructions (Signed)
 [facility] - A DEPT OF [facility]  Discharge Instructions: Thank you for choosing [facility] to provide your oncology and hematology care.   If you have a lab appointment with the Cancer Center, please go directly to the Cancer Center and check in at the registration area.   Wear comfortable clothing and clothing appropriate for easy access to any Portacath or PICC line.   We strive to give you quality time with your provider. You may need to reschedule your appointment if you arrive late (15 or more minutes).  Arriving late affects you and other patients whose appointments are after yours.  Also, if you miss three or more appointments without notifying the office, you may be dismissed from the clinic at the provider's discretion.      For prescription refill requests, have your pharmacy contact our office and allow 72 hours for refills to be completed.    Today you received the following chemotherapy and/or immunotherapy agents: Tecvayli       To help prevent nausea and vomiting after your treatment, we encourage you to take your nausea medication as directed.  BELOW ARE SYMPTOMS THAT SHOULD BE REPORTED IMMEDIATELY: *FEVER GREATER THAN 100.4 F (38 C) OR HIGHER *CHILLS OR SWEATING *NAUSEA AND VOMITING THAT IS NOT CONTROLLED WITH YOUR NAUSEA MEDICATION *UNUSUAL SHORTNESS OF BREATH *UNUSUAL BRUISING OR BLEEDING *URINARY PROBLEMS (pain or burning when urinating, or frequent urination) *BOWEL PROBLEMS (unusual diarrhea, constipation, pain near the anus) TENDERNESS IN MOUTH AND THROAT WITH OR WITHOUT PRESENCE OF ULCERS (sore throat, sores in mouth, or a toothache) UNUSUAL RASH, SWELLING OR PAIN  UNUSUAL VAGINAL DISCHARGE OR ITCHING   Items with * indicate a potential emergency and should be followed up as soon as possible or go to the Emergency Department if any problems should occur.  Please show the CHEMOTHERAPY ALERT CARD or IMMUNOTHERAPY  ALERT CARD at check-in to the Emergency Department and triage nurse.  Should you have questions after your visit or need to cancel or reschedule your appointment, please contact [facility] - A DEPT OF [facility]  Dept: [phone]  and follow the prompts.  Office hours are 8:00 a.m. to 4:30 p.m. Monday - Friday. Please note that voicemails left after 4:00 p.m. may not be returned until the following business day.  We are closed weekends and major holidays. You have access to a nurse at all times for urgent questions. Please call the main number to the clinic Dept: [phone] and follow the prompts.   For any non-urgent questions, you may also contact your provider using MyChart. We now offer e-Visits for anyone 31 and older to request care online for non-urgent symptoms. For details visit [email or web address].   Also download the MyChart app! Go to the app store, search "MyChart", open the app, select [place], and log in with your MyChart username and password.

## 2024-06-12 ENCOUNTER — Encounter: Payer: Self-pay | Admitting: Family Medicine

## 2024-06-12 ENCOUNTER — Ambulatory Visit (INDEPENDENT_AMBULATORY_CARE_PROVIDER_SITE_OTHER): Payer: Medicare HMO | Admitting: Family Medicine

## 2024-06-12 VITALS — BP 127/74 | HR 79 | Temp 97.9°F | Ht 60.0 in | Wt 105.4 lb

## 2024-06-12 DIAGNOSIS — R7303 Prediabetes: Secondary | ICD-10-CM

## 2024-06-12 DIAGNOSIS — E041 Nontoxic single thyroid nodule: Secondary | ICD-10-CM | POA: Diagnosis not present

## 2024-06-12 DIAGNOSIS — I10 Essential (primary) hypertension: Secondary | ICD-10-CM

## 2024-06-12 DIAGNOSIS — M546 Pain in thoracic spine: Secondary | ICD-10-CM | POA: Diagnosis not present

## 2024-06-12 DIAGNOSIS — E785 Hyperlipidemia, unspecified: Secondary | ICD-10-CM

## 2024-06-12 DIAGNOSIS — M810 Age-related osteoporosis without current pathological fracture: Secondary | ICD-10-CM

## 2024-06-12 DIAGNOSIS — Z0000 Encounter for general adult medical examination without abnormal findings: Secondary | ICD-10-CM

## 2024-06-12 DIAGNOSIS — Z131 Encounter for screening for diabetes mellitus: Secondary | ICD-10-CM | POA: Diagnosis not present

## 2024-06-12 NOTE — Patient Instructions (Addendum)
 blood pressures in general well controlled at home- some readings I don't like how low they are under 110- we discussed if has many more of those to trial just a half dose or 5 mg of amlodipine  especially if lightheaded at those times or feels poorly.  -we discussed benign paroxysmal positional vertigo (BPPV) episode she had it sounds like in July as distinct from lightheadedness form low blood pressure - which she has not had  #cough improving just finished doxycycline - faint crackles- if any worsening of cough or failure to continue to improve - may consider x-ray or repeat antibiotic(s)  Recommended follow up: Return in about 1 year (around 06/12/2025) for physical or sooner if needed.Schedule b4 you leave.

## 2024-06-12 NOTE — Progress Notes (Signed)
 Phone [phone]   Subjective:  Patient presents today for their annual physical. Chief complaint-noted.   See problem oriented charting- ROS- full  review of systems was completed and negative except for topics noted under acute/chronic concerns   The following were reviewed and entered/updated in epic: Past Medical History:  Diagnosis Date   Anemia    Blood transfusion without reported diagnosis Yrs ago when going through menopause   Cataract    Glaucoma    HYPERTENSION 03/11/2007   Multiple myeloma (HCC)    OSTEOPENIA 03/11/2007   Pre-diabetes    Thyroid  cancer (HCC)    Thyroid  disease    Tubular adenoma of colon 07/2015   Patient Active Problem List   Diagnosis Date Noted   Port-A-Cath in place 01/21/2022    Priority: High   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) 04/16/2021    Priority: High   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    Priority: High   Left thyroid  nodule 06/18/2020    Priority: High   Multiple myeloma not having achieved remission (HCC) 12/12/2018    Priority: High   Prediabetes 06/18/2020    Priority: Medium    Pain in thoracic spine 11/03/2018    Priority: Medium    Primary open angle glaucoma (POAG) of right eye, moderate stage 10/11/2017    Priority: Medium    Hyperglycemia 03/29/2015    Priority: Medium    Hyperlipidemia 10/18/2014    Priority: Medium    Essential hypertension 03/11/2007    Priority: Medium    Counseling regarding advance care planning and goals of care 12/12/2018    Priority: Low   History of adenomatous polyp of colon 07/23/2015    Priority: Low   Raynaud phenomenon 10/19/2014    Priority: Low   Syncope 10/19/2014    Priority: Low   Glaucoma 01/05/2011    Priority: Low   Osteoporosis 03/11/2007    Priority: Low   Combined forms of age-related cataract of left eye 04/28/2017   Past Surgical History:  Procedure Laterality Date   BONE MARROW BIOPSY     multiple   BREAST EXCISIONAL BIOPSY Right 2000    BREAST LUMPECTOMY  1990   benign   CATARACT EXTRACTION Bilateral 2018   DILATION AND CURETTAGE OF UTERUS     bleeding at menopause. No uterine cancer   EYE SURGERY  2017, 2018. 2019   IR IMAGING GUIDED PORT INSERTION  05/16/2021   IR RADIOLOGIST EVAL & MGMT  12/13/2018   THYROIDECTOMY N/A 08/02/2020   Procedure: TOTAL THYROIDECTOMY;  Surgeon: Eletha Boas, MD;  Location: [facility];  Service: General;  Laterality: N/A;   TONSILLECTOMY     age 65    Family History  Problem Relation Age of Onset   Heart disease Mother        CHF mother died 69   Arthritis Mother    Glaucoma Mother        sister as well   Alcohol abuse Father    Suicidality Father    Heart disease Sister        aortic valve replacement   Lung cancer Sister        smoker   Cancer Sister    Hyperlipidemia Brother    Hypertension Brother    COPD Brother    Colon polyps Brother    Arthritis Sister    Hypertension Sister    Glaucoma Sister    Hashimoto's thyroiditis Sister    Colon polyps Sister    Diabetes  Sister    Hypertension Son    Stroke Maternal Grandmother    Cystic fibrosis Niece    Colon cancer Neg Hx     Medications- reviewed and updated Current Outpatient Medications  Medication Sig Dispense Refill   acyclovir  (ZOVIRAX ) 400 MG tablet Take 1 tablet (400 mg total) by mouth 2 (two) times daily. 60 tablet 3   ALPHAGAN  P 0.1 % SOLN Place 1 drop into both eyes in the morning, at noon, and at bedtime.      amLODipine  (NORVASC ) 10 MG tablet Take 1 tablet (10 mg total) by mouth daily. 90 tablet 3   aspirin EC 81 MG tablet Take 81 mg by mouth daily.     Calcium  Carb-Cholecalciferol (CALCIUM  600 + D PO) Take by mouth.     Cholecalciferol (VITAMIN D3) 125 MCG (5000 UT) TABS Take 1,000 Units by mouth daily.     Co-Enzyme Q-10 100 MG CAPS Take 100 mg by mouth daily.     dapsone  100 MG tablet Take 1 tablet (100 mg total) by mouth daily. 30 tablet 3   dorzolamide -timolol  (COSOPT ) 22.3-6.8 MG/ML ophthalmic  solution Place 1 drop into both eyes 2 (two) times daily.   11   doxycycline  (VIBRA -TABS) 100 MG tablet Take 1 tablet (100 mg total) by mouth 2 (two) times daily. 14 tablet 0   fentaNYL  (DURAGESIC ) 12 MCG/HR Place 1 patch onto the skin every 3 (three) days. 10 patch 0   filgrastim -sndz (ZARXIO ) 300 MCG/0.5ML SOSY injection Inject 0.5 mLs (300 mcg total) into the skin 2 (two) times a week. On Monday and thursday 4 mL 2   gabapentin  (NEURONTIN ) 100 MG capsule Take 2 capsules (200 mg total) by mouth at bedtime. 60 capsule 0   levothyroxine  (SYNTHROID ) 88 MCG tablet Take 1 tablet (88 mcg total) by mouth daily before breakfast. 90 tablet 3   lidocaine -prilocaine  (EMLA ) cream Apply to affected area once 30 g 3   magnesium  chloride (SLOW-MAG) 64 MG TBEC SR tablet Take by mouth.     Multiple Vitamins-Minerals (MULTIVITAMIN WITH MINERALS) tablet Take 1 tablet by mouth daily. Centrum Silver 50 +     Netarsudil-Latanoprost  (ROCKLATAN) 0.02-0.005 % SOLN Place 1 drop into both eyes daily.     Omega-3 1000 MG CAPS Take 1,000 mg by mouth daily.      ondansetron  (ZOFRAN ) 8 MG tablet Take 1 tablet (8 mg total) by mouth every 8 (eight) hours as needed for nausea or vomiting. 30 tablet 1   oxyCODONE -acetaminophen  (PERCOCET) 5-325 MG tablet Take 1-2 tablets by mouth every 4 (four) hours as needed for moderate pain or severe pain. 90 tablet 0   polyethylene glycol (MIRALAX ) packet Take 17 g by mouth daily. 30 each 1   prochlorperazine  (COMPAZINE ) 10 MG tablet Take 1 tablet (10 mg total) by mouth every 6 (six) hours as needed for nausea or vomiting. 30 tablet 1   senna-docusate (EQ STOOL SOFTENER/LAXATIVE) 8.6-50 MG tablet Take 1 tablet by mouth at bedtime. 60 tablet 2   Simethicone  (GAS-X PO) Take 1 tablet by mouth as needed.     teclistamab -cqyv SQ (TECVAYLI ) 153 MG/1.7ML Inject 1.5 mg/kg into the skin once.     vitamin C (ASCORBIC ACID) 500 MG tablet Take 500 mg by mouth 3 (three) times a week.     No current  facility-administered medications for this visit.    Allergies-reviewed and updated Allergies  Allergen Reactions   Ace Inhibitors Cough   Benadryl [Diphenhydramine] Other (See Comments)  Heart races   Diamox [Acetazolamide]     Hypotensive event at ophthalmologist   Sulfamethoxazole     REACTION: rash   Lenalidomide  Rash   Penicillins Rash    REACTION: rash Did it involve swelling of the face/tongue/throat, SOB, or low BP? Yes Did it involve sudden or severe rash/hives, skin peeling, or any reaction on the inside of your mouth or nose? No Did you need to seek medical attention at a hospital or doctor's office? No When did it last happen?      more than 10 years ago If all above answers are "NO", may proceed with cephalosporin use.     Social History   Social History Narrative   Married. Lives with husband (patient of Dr. Katrinka). 1 son. No grandkids. 1 granddog.       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 Oropharynx normal Neck: no thyromegaly CV: RRR no murmurs rubs or gallops Lungs: faint crackles in lung bases - progressing on doxycycline - reports some lingering cough Abdomen: soft/nontender/nondistended/normal bowel sounds. No rebound or guarding.  Ext: no edema Skin: warm, dry Neuro: grossly normal, moves all extremities, PERRLA   Assessment and Plan   83 y.o. female presenting for annual physical.  Health Maintenance counseling: 1. Anticipatory guidance: Patient counseled regarding regular dental exams -q6 months, eye exams - every 3-4 months for glaucoma,  avoiding smoking and  second hand smoke , limiting alcohol to 1 beverage per day- very very rare , no illicit drugs.   2. Risk factor reduction:  Advised patient of need for regular exercise and diet rich and fruits and vegetables to reduce risk of heart attack and stroke.  Exercise-still walking and mowing lawn and vacuuming! Remains very active even despite cancer. Has considered light weights- I encouraged this Diet/weight management-had gained 10 pounds through last year and stable this year-congratulated her on stability-further weight gain is okay with us .  Wt Readings from Last 3 Encounters:  06/12/24 105 lb 6.4 oz (47.8 kg)  06/07/24 106 lb 12 oz (48.4 kg)  05/24/24 107 lb 6.4 oz (48.7 kg)  3. Immunizations/screenings/ancillary studies-recommended follow-up flu and COVID shot- waiting 6 months on COVID from last in April, flu planned late September, was also advised repeat Prevnar 20- even prior to 5 years apparently- but asked to clarify  Immunization History  Administered Date(s) Administered   Fluad Quad(high Dose 65+) 06/13/2019, 07/03/2020, 06/17/2021, 06/25/2022   INFLUENZA, HIGH DOSE SEASONAL PF 07/22/2016, 06/24/2017, 06/23/2018, 08/10/2022   Influenza Split 07/19/2012   Influenza Whole 07/17/2008, 06/28/2009, 06/25/2010   Influenza,inj,Quad PF,6+ Mos 06/27/2013, 06/18/2014   Influenza-Unspecified 07/03/2015   PFIZER Comirnaty(Gray Top)Covid-19 Tri-Sucrose Vaccine 06/13/2023   PFIZER(Purple Top)SARS-COV-2 Vaccination 11/09/2019, 12/04/2019, 06/03/2020, 01/09/2021, 07/04/2021, 07/03/2022   PNEUMOCOCCAL CONJUGATE-20 05/21/2021   Pneumococcal Conjugate-13 03/29/2015   Pneumococcal Polysaccharide-23 04/04/2006   Respiratory Syncytial Virus Vaccine,Recomb Aduvanted(Arexvy) 08/10/2022   Td 02/10/2010   Tdap 04/23/2023   Zoster Recombinant(Shingrix ) 03/27/2020, 09/06/2020   Zoster, Live 09/26/2008  4. Cervical cancer screening-past age based screening recommendations - no  vaginal bleeding or discharge    5. Breast cancer screening-  breast exam-prefer self exams  and mammogram-opts out of repeat   6. Colon cancer screening - 07/16/15 with no repeats-due to multiple myeloma we have opted to hold off on repeat previously- has talked to Dr. Aneita she believes 2 falls ago and he does not recommend repeat.  7. Skin cancer screening-saw Dr. Robinson early this year. advised regular sunscreen use. Denies worrisome, changing, or new skin lesions.  8. Birth control/STD check-monogamous/postmenopausal    9. 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 Tired on those days understandably so.  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 positive margin-followed by Dr. Talitha without RAI # Postsurgical hypothyroidism S: compliant On thyroid  medication-levothyroxine  88 mcg Lab Results  Component Value Date   TSH 0.52 01/27/2024  A/P:stable- continue  current medicines     #hypertension S: medication: Amlodipine  10 mg - at home even better most recent reading 127/74 - will log this -has had some readings as low as 103 but doesn't feel worse BP Readings from Last 3 Encounters:  06/12/24 127/74  06/07/24 (!) 152/85  05/24/24 (!) 145/73  A/P: blood pressures in general well controlled at home- some readings I don't like how low they are under 110- we discussed if has many more of those to trial just a half dose or 5 mg of amlodipine  especially if lightheaded at those times or feels poorly.  -we discussed benign paroxysmal positional vertigo (BPPV) episode she had it sounds like in July as distinct from lightheadedness form low blood pressure - which she has not had  #steroid induced hyperglycemia- monitor a1c on dexamethasone  in the past- 2025 off of this and < 5. Recent sugar 103 non fasting  #Hyperlipidemia - we discussed even if cholesterol still mildly high at her age there is no indication for primary prevention Lab Results  Component Value Date   CHOL 220 (H) 01/26/2020   HDL 68 01/26/2020   LDLCALC 132 (H) 01/26/2020   TRIG 97 01/26/2020   CHOLHDL 3.2 01/26/2020   Recommended follow up: Return in about 1 year (around 06/12/2025) for physical or sooner if needed.Schedule b4 you leave. Future Appointments  Date Time Provider Department Center  06/21/2024  8:15 AM [facility] FLUSH [facility] None  06/21/2024  8:40 AM Neomi Lis T, PA-C [facility] None  06/21/2024  9:30 AM [facility] INFUSION [facility] None  07/05/2024 12:45 PM [facility] FLUSH [facility] None  07/05/2024  1:30 PM  [facility] INFUSION [facility] None  07/19/2024  9:00 AM [facility] FLUSH [facility] None  07/19/2024  9:30 AM Onesimo Emaline Brink, MD [facility] None  07/19/2024 10:30 AM [facility] INFUSION [facility] None  01/26/2025  9:00 AM Trixie File, MD [facility] None  02/26/2025  8:00 AM [facility] ANNUAL WELLNESS VISIT 1 [facility] Milian    Lab/Order associations:NO labs   ICD-10-CM   1. Preventative health care  Z00.00     2. Essential hypertension  I10     3. Hyperlipidemia, unspecified hyperlipidemia type  E78.5     4. Pain in thoracic spine  M54.6     5. Left thyroid  nodule  E04.1     6. Prediabetes  R73.03     7. Age-related osteoporosis without current pathological fracture  M81.0     8. Screening for diabetes mellitus  Z13.1       No orders of the defined types were placed in this encounter.   Return precautions advised.  Garnette Lukes, MD

## 2024-06-13 ENCOUNTER — Other Ambulatory Visit: Payer: Self-pay

## 2024-06-13 DIAGNOSIS — C9000 Multiple myeloma not having achieved remission: Secondary | ICD-10-CM

## 2024-06-14 ENCOUNTER — Encounter: Payer: Self-pay | Admitting: Hematology

## 2024-06-14 MED ORDER — FENTANYL 12 MCG/HR TD PT72
1.0000 | MEDICATED_PATCH | TRANSDERMAL | 0 refills | Status: DC
Start: 2024-06-14 — End: 2024-07-13

## 2024-06-14 MED ORDER — GABAPENTIN 100 MG PO CAPS: 200.0000 mg | ORAL_CAPSULE | Freq: Every day | ORAL | 0 refills | Status: DC

## 2024-06-15 ENCOUNTER — Other Ambulatory Visit: Payer: Self-pay | Admitting: Family Medicine

## 2024-06-15 MED ORDER — AMLODIPINE BESYLATE 10 MG PO TABS: 10.0000 mg | ORAL_TABLET | Freq: Every day | ORAL | 3 refills | Status: AC

## 2024-06-15 NOTE — Telephone Encounter (Signed)
 Copied from CRM #[id number]. Topic: Clinical - Medication Refill >> Jun 15, 2024  8:11 AM [place] wrote: Medication: amLODipine  (NORVASC ) 10 MG tablet  Has the patient contacted their pharmacy? Yes (Agent: If no, request that the patient contact the pharmacy for the refill. If patient does not wish to contact the pharmacy document the reason why and proceed with request.) (Agent: If yes, when and what did the pharmacy advise?)  This is the patient's preferred pharmacy:  [facility] [address] [place] - [address] [place] [address] Phone: [phone] Fax: [phone]  Is this the correct pharmacy for this prescription? Yes If no, delete pharmacy and type the correct one.   Has the prescription been filled recently? Yes  Is the patient out of the medication? Yes  Has the patient been seen for an appointment in the last year OR does the patient have an upcoming appointment? Yes  Can we respond through MyChart? Yes  Agent: Please be advised that Rx refills may take up to 3 business days. We ask that you follow-up with your pharmacy.

## 2024-06-16 NOTE — Progress Notes (Signed)
 Ophthalmology Department Clinical Visit Note     CHIEF COMPLAINT Patient presents for Follow Up Exam (Vision is pretty good. Her Doctors are treating her for a bacterial infection of some kind and it is making her eyes water more. Jamison: Love x1 OU QHS /Dorzolamide -Timolol  x2 OU/Alphagan  P x3 OU Antonetta as needed )   HISTORY OF PRESENT ILLNESS: Megan Olson is a 83 y.o. old female who presents to the clinic today for:   HPI     Follow Up Exam   Follow-Up For:: (POAG).  In both eyes. Additional comments: Vision is pretty good. Her Doctors are treating her for a bacterial infection of some kind and it is making her eyes water more.  Taking:  Rocklatan x1 OU QHS  Dorzolamide -Timolol  x2 OU Alphagan  P x3 OU  Refresh as needed       Last edited by Chiquita JINNY Severe, OA on 06/19/2024 10:34 AM.      Thyroidectomy for thyroid  ca - 07/2020 Diagnosed with multiple myeloma 11/2018 - rx chemo Bleb needling w/ subconj [facility] OS-06/21/2018- bb (for IOP 20) Phaco/Trab OS 08/17/2017-mg/bb (for IOP 20)    Temporal suture cut LSL - 08/27/2017-bb    Apical suture cut LSL - 09/02/2017-bb Referred by Dr. Naomi from [facility] First seen by bb on 04/28/2017 POAG OU Diagnosed with glaucoma ~2010 TMAX 26 OU Phaco/istent OD-12/2016-Dr. Naomi SLT OU-2017-Dr. Tanner PI OU-2015-Dr. Tanner FH glaucoma-mother and sister 06/2019 - Pred 20mg  per week - stopped 09/2019  Humphrey visual field interpretation  11/24/2021: Good reliability OU OD: Superior paracentral defect (concentric peripheral artifact) - VFI 83; MD -9.4 OS: Normal - VFI 94; MD -3.6; stable  Humphrey visual field interpretation  08/16/2020: Good reliability OU OD: Superior temporal wedge, otherwise normal - VFI 94; MD -5.1; stable OS: Superior paracentral defect - VFI 92; MD -2.8; maybe worse given paracentral defect  Humphrey visual field interpretation  05/23/2018: Good reliability OU OD: Superior nasal suppression  - VFI 93; MD -3.8; stable OS: Normal - VFI 96; MD -2.7; stable  Humphrey visual field interpretation  07/30/2017: Good reliability OU OD: Superior arcuate defect - VFI 92; MD -4.2 OS: Non-specific central suppression - VFI 96; MD -3.6  OCT RNFL -  11/24/2021: Reliability:  Good, SS  7/10 OD,   6/10 OS OD:  Diffuse thinning on profile view; sup avg 71, inf avg 58 OS:  Diffuse thinning on profile view; sup avg 79, inf avg 52   CURRENT MEDICATIONS: Current Outpatient Medications on File Prior to Visit (Ophthalmic Drugs)  Medication Sig  . brimonidine  (ALPHAGAN ) 0.2 % ophthalmic solution Administer one drop into both eyes 3 (three) times a day.  . dorzolamide -timoloL  (COSOPT ) 22.3-6.8 mg/mL ophthalmic solution Administer one drop into both eyes 2 (two) times a day.  . netarsudiL-latanoprost  (Rocklatan) 0.02-0.005 % drop Administer one drop into both eyes at bedtime.   Current Outpatient Medications on File Prior to Visit (Other)  Medication Sig  . acyclovir  (ZOVIRAX ) 400 mg tablet Take 1 tablet (400 mg total) by mouth 2 (two) times a day.  . amLODIPine  (NORVASC ) 10 mg tablet Take 10 mg by mouth at bedtime.  SABRA aspirin 81 mg EC tablet Take 81 mg by mouth Once Daily.  . calcium  citrate 250 mg calcium  tab Take 2.4 tablets (600 mg total) by mouth daily.  . cholecalciferol (VITAMIN D3) 1,000 unit (25 mcg) tablet/capsule Take 1 each (1,000 Units total) by mouth daily.  . coenzyme Q-10 100 mg capsule Take 100  mg by mouth Once Daily.  . dapsone  100 mg tablet Take 1 tablet (100 mg total) by mouth daily.  . filgrastim -sndz (ZARXIO ) 300 mcg/0.5 mL syrg injection Inject 0.5 mL (300 mcg total) under the skin every Monday, Wednesday and Friday. Or as instructed by provider based on neutrophil count.  . folic acid /multivit-min/lutein (CENTRUM SILVER ORAL) Take 1 tablet by mouth daily.  . gabapentin  (NEURONTIN ) 100 mg capsule Take 2 capsules (200 mg total) by mouth daily after lunch AND 4 capsules (400 mg  total) at bedtime.  . levothyroxine  (SYNTHROID ) 88 mcg tablet Take 88 mcg by mouth every morning.  . lidocaine -prilocaine  (EMLA ) 2.5-2.5 % cream Apply to port prior to treatment.  . magnesium  carb,citrate,oxide (MAGNESIUM  COMPLEX ORAL) Take 1 capsule by mouth every evening.  SABRA omega-3 fatty acids 1,000 mg cap Take 1,000 mg by mouth Once Daily.  . ondansetron  (ZOFRAN -ODT) 8 mg disintegrating tablet Dissolve 8 mg on tongue every 8 (eight) hours as needed for nausea or vomiting.  . polyethylene glycol (GLYCOLAX ) 17 gram packet Take 17 g by mouth daily as needed for constipation.  . sennosides-docusate sodium  (PERICOLACE) 8.6-50 mg per tablet Take 2 tablets by mouth every evening.    Referring physician: No referring provider defined for this encounter.  ALLERGIES Allergies  Allergen Reactions  . Ace Inhibitors Cough  . Acetazolamide Hypotension    Hypotensive event at ophthalmologist  . Diphenhydramine Itching    Heart races  . Lenalidomide  Rash  . Penicillins Rash    REACTION: rash  . Sulfamethoxazole Rash    REACTION: rash    PAST MEDICAL HISTORY Past Medical History:  Diagnosis Date  . Adenomatous polyp of colon   . Glaucoma 2002  . Hx of thyroid  cancer   . Hypertension   . Multiple thyroid  nodules   . Osteopenia 03/11/2007   Overview:  04/13/13 -2.3 AP spine. Vit D. MV 600 + diet.  Fosamax for 3-4 years in past and eventually came off concerned of SE  Last Assessment & Plan:  Osteopenia- had been on fosamax 3-4 years in past but came off due to [place]. Agrees to updating dexa but wants to wait after eye surgeries- she will let us  know.  Ap spine -2.3  . Pre-diabetes    A1C on 04/04/2018 was 6.3  . Primary open angle glaucoma (POAG) of left eye, moderate stage 04/28/2017  . Primary open angle glaucoma (POAG) of right eye, moderate stage 10/11/2017  . Pseudophakia of both eyes   . Raynaud phenomenon 2015  . Syncope 10/19/2014   Related to food or drink with 5-6 episodes since 2005.  Several episodes with wine before dinner though not after. Thought vasovagal vs orthostatic (if becomes dehydrated). States had cardiac monitoring and no abnormalities-difficult to find in chart.    Past Surgical History:  Procedure Laterality Date  . CATARACT EXTRACTION W/  INTRAOCULAR LENS IMPLANT Left 08/17/2017   Procedure: PHACOEMULSIFICATION PC / IOL GMO;  Surgeon: Donnice Sickles, MD;  Location: [facility] [place] OUTPATIENT OR;  Service: Ophthalmology;  Laterality: Left;  . CATARACT EXTRACTION W/  INTRAOCULAR LENS IMPLANT Right 12/28/2016   Procedure: CATARACT EXTRACTION W/  INTRAOCULAR LENS IMPLANT; + iStent in [place]  . GLAUCOMA SURGERY Left 06/21/2018   Procedure: BLEB REVISION;  Surgeon: Reyes Thresa Bracket, MD;  Location: [facility] OUTPATIENT OR;  Service: Ophthalmology;  Laterality: Left;  Topical anesthesia with IV sedation, ASA 2, outpatient  . TONSILLECTOMY     Procedure: TONSILLECTOMY  . TOTAL THYROIDECTOMY  07/2020  Procedure: TOTAL THYROIDECTOMY  . TRABECULECTOMY Left 08/17/2017   Procedure: TRABECULECTOMY;  Surgeon: Reyes Thresa Bracket, MD;  Location: [facility] OUTPATIENT OR;  Service: Ophthalmology;  Laterality: Left;  outpatient, ASA 3, GMO, combined with phaco by Dr Caresse, mitomycin     FAMILY HISTORY Family History  Problem Relation Name Age of Onset  . Glaucoma Mother    . Cataracts Mother    . Hypertension Mother    . Stroke Mother    . Glaucoma Sister    . Cancer Sister    . Cataracts Sister    . Diabetes Sister    . Hypertension Sister    . Cataracts Brother    . Hypertension Brother      SOCIAL HISTORY Social History   Tobacco Use  . Smoking status: Former    Current packs/day: 0.00    Types: Cigarettes    Quit date: 04/28/1969    Years since quitting: 55.1  . Smokeless tobacco: Never  Substance Use Topics  . Alcohol use: Yes        OPHTHALMIC EXAM:  Base Eye Exam     Visual Acuity (Snellen - Linear)       Right Left   Dist cc 20/25 -2 20/25 -2     Correction: Glasses         Tonometry (Applanation, 11:09 AM)       Right Left   Pressure 15 16         Neuro/Psych     Oriented x3: Yes   Mood/Affect: Normal           Slit Lamp and Fundus Exam     Slit Lamp Exam       Right Left   Lids/Lashes Normal, deep set Normal, deep set   Conjunctiva/Sclera 1+ vessels 1+ vessels, needleable   Cornea Clear Clear   Anterior Chamber Deep and quiet Deep and quiet   Iris PI at 1:00 PI at 12:00   Lens PC IOL, 2+ PCO PC IOL             IMAGING AND PROCEDURES:  @EYRES @     ASSESSMENT:  1. Primary open angle glaucoma (POAG) of left eye, moderate stage      2. Primary open angle glaucoma (POAG) of right eye, moderate stage       Patient tolerating and taking medications as prescribed.  IOP good OU Continue drops the same Recheck in 4 months  PLAN:  Rocklatan x1 OU QHS  Dorzolamide -Timolol  x2 OU Alphagan  P x3 OU  Recommend ATs prn  Reviewed proper drop installation technique Return in about 4 months (around 10/19/2024).  Ophthalmic Meds Ordered this visit:  There were no meds ordered this visit.      Patient Instructions  DAILY DRUG REMINDER  Wait 10 minutes between drops!!! (Keep eyes closed for 2 MINUTES after each drop)   Drug EYE R = Right L = Left B = Both Breakfast Lunch Supper Bedtime Time Frame   Rocklatan (White top)  B    X   Dorzolamide -Timolol  *Cosopt * (Dark blue top)  B X  X  Take about 12 hours apart.  Alphagan  P (Purple top)  B X X X     Artificial Tears (Systane Complete or Refresh)   B     As needed for comfort.           HOW TO TAKE EYEDROPS 1.   Make sure you understand when, and exactly how often, to take your eyedrop  medicine.  Preferably, learn the names of your medicines, or at least be able to identify your eyedrops by the color of the cap of the bottle (e.g., "I take the purple top 2 times a day in the right eye and the yellow top 1 time a day in both  eyes."). 2.  Always wash your hands prior to instilling your eyedrops. 3.  If you are putting drops in both eyes, then sit down or lie down to put in your drops.  This is because it is important to close the eye getting drops for 2 minutes immediately after instilling the drop (this markedly improves absorption).  If you are putting the drops in both eyes, then you will be closing both eyes for at least 2 minutes, therefore you should be sitting or lying down.  If you must stand to put in the drops (need to look in mirror), then have a chair next to you so that you can sit just after instilling the drops and close your eyes for 2 minutes.  If you are only instilling an eyedrop in one eye, then you only have to close that eye after the drop, and therefore you don't necessarily have to sit or lie. 4.  If you are taking more than one drop at a time in the same eye, then you must separate the drops by at least 10 minutes. 5.  To put in the drop, hold the bottle in your dominant hand between your forefinger and thumb and invert the bottle over the targeted eye.  Your head should be tilted back and you should roll your eyes upward. 6.  With your other hand, pull the lower eyelid down and try to instill the drop in the "pouch" made by pulling the lid down, or on the lower part of the eye ball.  Position the bottle close to the eye, but do not touch the eye with the dropper tip.  Squeeze the sides of the bottle and a drop of the medicine will come out. 7.  You will feel the drop when it contacts the eye, and then you should immediately close the eye, or if you are putting the drop in both eyes, move your hands immediately to the other eye, and place the drop in it, and then close both eyes as recommended in #2 above. 8.  While the eye/s are closed, use a tissue to wipe away any medication that remains on the lid or lashes.  You may do this during the 2 minutes that the eye remains closed, just do not open the eye to  wipe it. 9.  Always remember to put the cap back on the bottle and store the bottle away from pets and not in direct sunlight.  The bottle does not have to be refrigerated, but it may be if that is preferred (some people prefer feeling the cooled eyedrop on the eye). 10.  Never stop the drops without conferring with your doctor first, even if you feel that the drops are not helping your eye.  If you believe you are experiencing side-effects from the eyedrops, then discuss that, or any issues or concerns,  with your doctor. 3  Explained the diagnoses, plan, and follow up with the patient and they expressed understanding.  Patient expressed understanding of the importance of proper follow up care.   This document serves as a record of services personally performed by J. Thresa Bracket. It was created on their behalf by My Rayleen,  a trained medical scribe. The creation of this record is the provider's dictation and/or activities during the visit. I agree the documentation is accurate and complete.  Electronically signed by: JINNY Thresa Bracket, MD 06/20/2024 8:02 PM

## 2024-06-19 DIAGNOSIS — H401122 Primary open-angle glaucoma, left eye, moderate stage: Secondary | ICD-10-CM | POA: Diagnosis not present

## 2024-06-19 DIAGNOSIS — H401112 Primary open-angle glaucoma, right eye, moderate stage: Secondary | ICD-10-CM | POA: Diagnosis not present

## 2024-06-21 ENCOUNTER — Inpatient Hospital Stay

## 2024-06-21 ENCOUNTER — Inpatient Hospital Stay: Admitting: Physician Assistant

## 2024-06-21 ENCOUNTER — Other Ambulatory Visit

## 2024-06-21 ENCOUNTER — Ambulatory Visit: Payer: Self-pay | Admitting: Physician Assistant

## 2024-06-21 ENCOUNTER — Ambulatory Visit

## 2024-06-21 ENCOUNTER — Ambulatory Visit (HOSPITAL_COMMUNITY)
Admission: RE | Admit: 2024-06-21 | Discharge: 2024-06-21 | Disposition: A | Discharge status: Home / Self Care | Source: Ambulatory Visit | Attending: Physician Assistant | Admitting: Physician Assistant

## 2024-06-21 VITALS — BP 141/72 | HR 70 | Temp 98.1°F | Resp 16 | Ht 60.0 in | Wt 104.0 lb

## 2024-06-21 DIAGNOSIS — R058 Other specified cough: Secondary | ICD-10-CM

## 2024-06-21 DIAGNOSIS — C9002 Multiple myeloma in relapse: Secondary | ICD-10-CM

## 2024-06-21 DIAGNOSIS — Z7189 Other specified counseling: Secondary | ICD-10-CM

## 2024-06-21 DIAGNOSIS — Z5111 Encounter for antineoplastic chemotherapy: Secondary | ICD-10-CM | POA: Diagnosis not present

## 2024-06-21 DIAGNOSIS — R059 Cough, unspecified: Secondary | ICD-10-CM | POA: Diagnosis not present

## 2024-06-21 LAB — CBC WITH DIFFERENTIAL (CANCER CENTER ONLY)
Abs Immature Granulocytes: 0.13 K/uL — ABNORMAL HIGH (ref 0.00–0.07)
Basophils Absolute: 0.1 K/uL (ref 0.0–0.1)
Basophils Relative: 1 %
Eosinophils Absolute: 0.2 K/uL (ref 0.0–0.5)
Eosinophils Relative: 3 %
HCT: 27.2 % — ABNORMAL LOW (ref 36.0–46.0)
Hemoglobin: 8.8 g/dL — ABNORMAL LOW (ref 12.0–15.0)
Immature Granulocytes: 2 %
Lymphocytes Relative: 4 %
Lymphs Abs: 0.3 K/uL — ABNORMAL LOW (ref 0.7–4.0)
MCH: 34.2 pg — ABNORMAL HIGH (ref 26.0–34.0)
MCHC: 32.4 g/dL (ref 30.0–36.0)
MCV: 105.8 fL — ABNORMAL HIGH (ref 80.0–100.0)
Monocytes Absolute: 0.9 K/uL (ref 0.1–1.0)
Monocytes Relative: 15 %
Neutro Abs: 4.5 K/uL (ref 1.7–7.7)
Neutrophils Relative %: 75 %
Platelet Count: 252 K/uL (ref 150–400)
RBC: 2.57 MIL/uL — ABNORMAL LOW (ref 3.87–5.11)
RDW: 16 % — ABNORMAL HIGH (ref 11.5–15.5)
WBC Count: 6.1 K/uL (ref 4.0–10.5)
nRBC: 0 % (ref 0.0–0.2)

## 2024-06-21 LAB — CMP (CANCER CENTER ONLY)
ALT: 27 U/L (ref 0–44)
AST: 21 U/L (ref 15–41)
Albumin: 4.1 g/dL (ref 3.5–5.0)
Alkaline Phosphatase: 58 U/L (ref 38–126)
Anion gap: 4 — ABNORMAL LOW (ref 5–15)
BUN: 15 mg/dL (ref 8–23)
CO2: 30 mmol/L (ref 22–32)
Calcium: 8.9 mg/dL (ref 8.9–10.3)
Chloride: 105 mmol/L (ref 98–111)
Creatinine: 0.58 mg/dL (ref 0.44–1.00)
GFR, Estimated: >60 mL/min (ref >60)
Glucose, Bld: 117 mg/dL — ABNORMAL HIGH (ref 70–99)
Potassium: 4.2 mmol/L (ref 3.5–5.1)
Sodium: 139 mmol/L (ref 135–145)
Total Bilirubin: 0.6 mg/dL (ref 0.0–1.2)
Total Protein: 5.7 g/dL — ABNORMAL LOW (ref 6.5–8.1)

## 2024-06-21 MED ORDER — AZITHROMYCIN 250 MG PO TABS: ORAL_TABLET | ORAL | 0 refills | Status: AC

## 2024-06-21 MED ORDER — TECLISTAMAB-CQYV CHEMO 153 MG/1.7ML SQ SOLN
1.5000 mg/kg | Freq: Once | SUBCUTANEOUS | Status: AC
  Administered 2024-06-21: 70 mg via SUBCUTANEOUS
  Filled 2024-06-21: qty 0.78

## 2024-06-21 NOTE — Progress Notes (Signed)
[facility]   [phone]) [phone] [phone]   Clinic Follow up Note   Patient Care Team: Katrinka Garnette KIDD, MD as PCP - General (Family Medicine) Onesimo Emaline Brink, MD as Consulting Physician (Hematology) Bond, Reyes Mcardle, MD as Referring Physician (Ophthalmology) Evern Nian, Rogue, MD as Consulting Physician (Hematology and Oncology) Trixie File, MD as Consulting Physician (Internal Medicine) Eletha Boas, MD as Consulting Physician (General Surgery) Nicholaus Sherlean CROME, [facility] (Inactive) as Pharmacist (Pharmacist)  Date of Service: 06/21/24  CHIEF COMPLAINT:  Follow-up for continuation and management of multiple myeloma   SUMMARY OF ONCOLOGIC HISTORY: Oncology History  Multiple myeloma not having achieved remission (HCC)  12/12/2018 Initial Diagnosis   Multiple myeloma not having achieved remission (HCC)   12/20/2018 - 10/04/2019 Chemotherapy   The patient had dexamethasone  (DECADRON ) tablet 20 mg, 20 mg (100 % of original dose 20 mg), Oral, Once, 14 of 14 cycles Dose modification: 20 mg (original dose 20 mg, Cycle 1), 10 mg (original dose 20 mg, Cycle 14) Administration: 20 mg (12/20/2018), 20 mg (12/27/2018), 20 mg (01/10/2019), 20 mg (01/17/2019), 20 mg (01/31/2019), 20 mg (02/07/2019), 20 mg (03/14/2019), 20 mg (03/21/2019), 20 mg (02/21/2019), 20 mg (02/28/2019), 20 mg (04/04/2019), 20 mg (04/11/2019), 20 mg (04/25/2019), 20 mg (05/02/2019), 20 mg (05/16/2019), 20 mg (05/23/2019), 20 mg (06/06/2019), 20 mg (06/13/2019), 20 mg (06/27/2019), 20 mg (07/04/2019), 20 mg (07/18/2019), 20 mg (07/25/2019), 20 mg (08/08/2019), 20 mg (08/15/2019), 20 mg (08/29/2019), 20 mg (09/05/2019), 10 mg (09/19/2019) lenalidomide  (REVLIMID ) 15 MG capsule, 1 of 1 cycle, Start date: 01/18/2019, End date: 02/21/2019 bortezomib  SQ (VELCADE ) chemo injection 2 mg, 1.3 mg/m2 = 2 mg, Subcutaneous,  Once, 14 of 14 cycles Administration: 2 mg (12/20/2018), 2 mg (12/27/2018), 2 mg (12/23/2018), 2 mg  (12/30/2018), 2 mg (01/10/2019), 2 mg (01/17/2019), 2 mg (01/13/2019), 2 mg (01/20/2019), 2 mg (01/31/2019), 2 mg (02/07/2019), 2 mg (02/03/2019), 2 mg (02/10/2019), 2 mg (03/14/2019), 2 mg (03/17/2019), 2 mg (03/21/2019), 2 mg (03/24/2019), 2 mg (02/21/2019), 2 mg (02/24/2019), 2 mg (02/28/2019), 2 mg (03/03/2019), 2 mg (04/04/2019), 2 mg (04/06/2019), 2 mg (04/11/2019), 2 mg (04/14/2019), 2 mg (04/25/2019), 2 mg (04/28/2019), 2 mg (05/02/2019), 2 mg (05/05/2019), 2 mg (05/16/2019), 2 mg (05/19/2019), 2 mg (05/23/2019), 2 mg (05/26/2019), 2 mg (06/06/2019), 2 mg (06/09/2019), 2 mg (06/13/2019), 2 mg (06/16/2019), 2 mg (06/27/2019), 2 mg (06/30/2019), 2 mg (07/04/2019), 2 mg (07/07/2019), 2 mg (07/18/2019), 2 mg (07/21/2019), 2 mg (07/25/2019), 2 mg (07/28/2019), 2 mg (08/08/2019), 2 mg (08/11/2019), 2 mg (08/15/2019), 2 mg (08/18/2019), 2 mg (08/29/2019), 2 mg (09/01/2019), 2 mg (09/05/2019), 2 mg (09/08/2019), 2 mg (09/19/2019), 2 mg (10/04/2019)  for chemotherapy treatment.    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   INTERVAL HISTORY:  Megan Olson is a 83 y.o. female returns for continued  evaluation and management of multiple myeloma. He is here prior to her next teclistamab  therapy. She is unaccompanied for this visit.   Megan Olson reports her energy levels are unchanged. She does experience fatigue but can complete her ADLs on her own. She  recently completed course of antibiotic therapy with residual congestion and productive cough with yellow sputum. She adds that the cough is mainly when she is lying down for sleep. She denies fevers ,sore throat or post nasal drip. She has mild shortness of breath mainly with exertion. She denies nausea, vomiting or bowel habit changes.  She reports having ongoing back pain which is controlled with fentanyl  patch and breakthrough percocet as needed.  She denies easy bruising or overt signs of bleeding. She has no other complaints.     MEDICAL HISTORY:  Past Medical History:  Diagnosis Date   Anemia    Blood transfusion without reported diagnosis Yrs ago when going through menopause   Cataract    Glaucoma    HYPERTENSION 03/11/2007   Multiple myeloma (HCC)    OSTEOPENIA 03/11/2007   Pre-diabetes    Thyroid  cancer (HCC)    Thyroid  disease    Tubular adenoma of colon 07/2015    SURGICAL HISTORY: Past Surgical History:  Procedure Laterality Date   BONE MARROW BIOPSY     multiple   BREAST EXCISIONAL BIOPSY Right 2000   BREAST LUMPECTOMY  1990   benign   CATARACT EXTRACTION Bilateral 2018   DILATION AND CURETTAGE OF UTERUS     bleeding at menopause. No uterine cancer   EYE SURGERY  2017, 2018. 2019   IR IMAGING GUIDED PORT INSERTION  05/16/2021   IR RADIOLOGIST EVAL & MGMT  12/13/2018   THYROIDECTOMY N/A 08/02/2020   Procedure: TOTAL THYROIDECTOMY;  Surgeon: Eletha Boas, MD;  Location: [facility];  Service: General;  Laterality: N/A;   TONSILLECTOMY     age 38   I have reviewed the social history and family history with the patient and they are unchanged from previous note.  ALLERGIES:  is allergic to ace inhibitors, benadryl [diphenhydramine], diamox [acetazolamide], sulfamethoxazole, lenalidomide , and penicillins.  MEDICATIONS:  Current Outpatient Medications  Medication Sig Dispense Refill   acyclovir  (ZOVIRAX ) 400 MG tablet Take 1 tablet (400 mg total) by mouth 2 (two) times  daily. 60 tablet 3   ALPHAGAN  P 0.1 % SOLN Place 1 drop into both eyes in the morning, at noon, and at bedtime.      amLODipine  (NORVASC ) 10 MG tablet Take 1 tablet (10 mg total) by mouth daily. 90 tablet 3   aspirin EC 81 MG tablet Take 81 mg by mouth daily.     Calcium  Carb-Cholecalciferol (CALCIUM  600 + D PO) Take by mouth.     Cholecalciferol (VITAMIN D3) 125 MCG (5000 UT) TABS Take 1,000 Units by mouth daily.     Co-Enzyme Q-10 100 MG CAPS Take 100 mg by mouth daily.     dapsone  100 MG tablet Take 1 tablet (100 mg total) by mouth daily. 30 tablet 3   dorzolamide -timolol  (COSOPT ) 22.3-6.8 MG/ML ophthalmic solution Place 1 drop into both eyes 2 (two) times daily.   11   doxycycline  (VIBRA -TABS) 100 MG tablet Take 1 tablet (100 mg total) by mouth 2 (two) times daily. 14 tablet 0   fentaNYL  (DURAGESIC ) 12 MCG/HR Place 1 patch onto the skin every 3 (three) days. 10 patch 0   filgrastim -sndz (ZARXIO ) 300 MCG/0.5ML SOSY injection Inject 0.5 mLs (300 mcg total) into the  skin 2 (two) times a week. On Monday and thursday 4 mL 2   gabapentin  (NEURONTIN ) 100 MG capsule Take 2 capsules (200 mg total) by mouth at bedtime. 60 capsule 0   levothyroxine  (SYNTHROID ) 88 MCG tablet Take 1 tablet (88 mcg total) by mouth daily before breakfast. 90 tablet 3   lidocaine -prilocaine  (EMLA ) cream Apply to affected area once 30 g 3   magnesium  chloride (SLOW-MAG) 64 MG TBEC SR tablet Take by mouth.     Multiple Vitamins-Minerals (MULTIVITAMIN WITH MINERALS) tablet Take 1 tablet by mouth daily. Centrum Silver 50 +     Netarsudil-Latanoprost  (ROCKLATAN) 0.02-0.005 % SOLN Place 1 drop into both eyes daily.     Omega-3 1000 MG CAPS Take 1,000 mg by mouth daily.      ondansetron  (ZOFRAN ) 8 MG tablet Take 1 tablet (8 mg total) by mouth every 8 (eight) hours as needed for nausea or vomiting. 30 tablet 1   oxyCODONE -acetaminophen  (PERCOCET) 5-325 MG tablet Take 1-2 tablets by mouth every 4 (four) hours as needed for moderate  pain or severe pain. 90 tablet 0   polyethylene glycol (MIRALAX ) packet Take 17 g by mouth daily. 30 each 1   prochlorperazine  (COMPAZINE ) 10 MG tablet Take 1 tablet (10 mg total) by mouth every 6 (six) hours as needed for nausea or vomiting. 30 tablet 1   senna-docusate (EQ STOOL SOFTENER/LAXATIVE) 8.6-50 MG tablet Take 1 tablet by mouth at bedtime. 60 tablet 2   Simethicone  (GAS-X PO) Take 1 tablet by mouth as needed.     teclistamab -cqyv SQ (TECVAYLI ) 153 MG/1.7ML Inject 1.5 mg/kg into the skin once.     vitamin C (ASCORBIC ACID) 500 MG tablet Take 500 mg by mouth 3 (three) times a week.     No current facility-administered medications for this visit.   Facility-Administered Medications Ordered in Other Visits  Medication Dose Route Frequency Provider Last Rate Last Admin   teclistamab -cqyv SQ (TECVAYLI ) chemo injection (153mg /1.68mL concentration) 70 mg  1.5 mg/kg (Treatment Plan Recorded) Subcutaneous Once Onesimo Emaline Brink, MD        PHYSICAL EXAMINATION:. BP (!) 141/72 (BP Location: Left Arm, Patient Position: Sitting)   Pulse 70   Temp 98.1 F (36.7 C) (Temporal)   Resp 16   Ht 5' (1.524 m)   Wt 104 lb (47.2 kg)   SpO2 93%   BMI 20.31 kg/m    GENERAL:alert, in no acute distress and comfortable SKIN: no acute rashes, no significant lesions EYES: conjunctiva are pink and non-injected, sclera anicteric OROPHARYNX: MMM, no exudates, no oropharyngeal erythema or ulceration LUNGS: normal respiratory effort, crackles heard in RLL HEART: regular rate & rhythm Extremity: no pedal edema PSYCH: alert & oriented x 3 with fluent speech NEURO: no focal motor/sensory deficits    LABORATORY DATA:     Latest Ref Rng & Units 06/21/2024    8:18 AM 06/07/2024   10:56 AM 05/24/2024   10:39 AM  CBC  WBC 4.0 - 10.5 K/uL 6.1  4.8  9.9   Hemoglobin 12.0 - 15.0 g/dL 8.8  9.6  9.2   Hematocrit 36.0 - 46.0 % 27.2  29.0  28.0   Platelets 150 - 400 K/uL 252  262  257       Latest Ref Rng  & Units 06/21/2024    8:18 AM 06/07/2024   10:56 AM 05/24/2024   10:39 AM  CMP  Glucose 70 - 99 mg/dL 882  896  98   BUN 8 - 23  mg/dL 15  15  16    Creatinine 0.44 - 1.00 mg/dL 9.41  9.44  9.42   Sodium 135 - 145 mmol/L 139  138  138   Potassium 3.5 - 5.1 mmol/L 4.2  4.4  4.4   Chloride 98 - 111 mmol/L 105  105  106   CO2 22 - 32 mmol/L 30  27  27    Calcium  8.9 - 10.3 mg/dL 8.9  9.0  8.6   Total Protein 6.5 - 8.1 g/dL 5.7  5.9  5.6   Total Bilirubin 0.0 - 1.2 mg/dL 0.6  0.6  0.5   Alkaline Phos 38 - 126 U/L 58  55  64   AST 15 - 41 U/L 21  22  19    ALT 0 - 44 U/L 27  21  15      RADIOGRAPHIC STUDIES: I have personally reviewed the radiological images as listed and agreed with the findings in the report. No results found.   ASSESSMENT & PLAN:   Megan Olson is a 83 y.o. female who presents for a follow up for multiple myeloma.   1. 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 -Transitioned to PO Cytoxan /Velcade /Dex of each 21-day cycle in May 2020 -Transitioned to maintenance ixazomib therapy in December 2020. -Due to progression of disease in July 2022, started Kyprolis /Dex therapy -Due to progression of disease in August 2023, switched to Dara/Pom/Dex -Due to progression of disease in September 2024, switched to elotuzumab /velcade /dex -Due to progression of disease in October 2024, switched to teclistamab  at [facility]. Tolerated step up therapy without CRS or ICANS.  -Cycle 8, Day 1 of Teclistamab  was delayed by one week 2/2 neutropenia, started on Zarxio  M/W/F of off weeks of therapy.  -Transitioned to Zarxio  Mon/Thurs weekly starting Cycle 9 of Teclistamab .  -Discontinued Zarxio   starting 05/24/2024.   2.  Cancer related pain  -Currently well controlled -Continue fentanyl  patch at 12 mcg/h and Percocet for break through pain.    PLAN: -Currently on maintenance Tecvayli  every 2 weeks -Labs from today were reviewed and adequate for treatment. WBC 6.1, Hgb 8.8, Plt 252, Creatinine and calcium  levels normal. LFTs in range.  -Proceed with treatment today without any dose modifications.  -Continue to hold Zarxio  -Encouraged to incorporate 1-2 protein shakes per day to improve caloric intake.  -Recommend chest xray to further evaluate cough. Will send Z pak to help with residual congestion and cough. Will discuss with Dr. Onesimo starting IVIG due to recurrent URI symptoms.  -Continue dapsone  for PJP prophylaxis -Continue acyclovir  -Continue Zometa  every 12 weeks  FOLLOW-UP: -Continue Teclistamab  as per orders every 2 weeks with labs and visit.    All of the patient's questions were answered with apparent satisfaction. The patient knows to call the clinic with any problems, questions or concerns.   I have spent a total of 30 minutes minutes of face-to-face and non-face-to-face time, preparing to see the patient,  performing a medically appropriate examination, counseling and educating the patient,  documenting clinical information in the electronic health record, independently interpreting results and communicating results to the patient, and care coordination.    Johnston Police PA-C Dept of Hematology and Oncology [facility] Phone:  [phone]     

## 2024-06-22 LAB — KAPPA/LAMBDA LIGHT CHAINS
Kappa free light chain: <0.7 mg/L — ABNORMAL LOW (ref 3.3–19.4)
Kappa, lambda light chain ratio: UNDETERMINED
Lambda free light chains: <1.5 mg/L — ABNORMAL LOW (ref 5.7–26.3)

## 2024-06-24 ENCOUNTER — Encounter: Payer: Self-pay | Admitting: Hematology

## 2024-06-24 LAB — MULTIPLE MYELOMA PANEL, SERUM
Albumin SerPl Elph-Mcnc: 3.5 g/dL (ref 2.9–4.4)
Albumin/Glob SerPl: 2.1 — ABNORMAL HIGH (ref 0.7–1.7)
Alpha 1: 0.3 g/dL (ref 0.0–0.4)
Alpha2 Glob SerPl Elph-Mcnc: 0.6 g/dL (ref 0.4–1.0)
B-Globulin SerPl Elph-Mcnc: 0.8 g/dL (ref 0.7–1.3)
Gamma Glob SerPl Elph-Mcnc: 0.1 g/dL — ABNORMAL LOW (ref 0.4–1.8)
Globulin, Total: 1.7 g/dL — ABNORMAL LOW (ref 2.2–3.9)
IgA: <5 mg/dL — ABNORMAL LOW (ref 64–422)
IgG (Immunoglobin G), Serum: 98 mg/dL — ABNORMAL LOW (ref 586–1602)
IgM (Immunoglobulin M), Srm: <5 mg/dL — ABNORMAL LOW (ref 26–217)
Total Protein ELP: 5.2 g/dL — ABNORMAL LOW (ref 6.0–8.5)

## 2024-07-04 ENCOUNTER — Ambulatory Visit: Admitting: Physician Assistant

## 2024-07-04 VITALS — BP 135/72 | HR 71 | Temp 98.7°F | Resp 18 | Ht 60.0 in | Wt 107.0 lb

## 2024-07-04 DIAGNOSIS — C9000 Multiple myeloma not having achieved remission: Secondary | ICD-10-CM | POA: Diagnosis not present

## 2024-07-04 DIAGNOSIS — Z5111 Encounter for antineoplastic chemotherapy: Secondary | ICD-10-CM | POA: Diagnosis not present

## 2024-07-04 DIAGNOSIS — R0789 Other chest pain: Secondary | ICD-10-CM | POA: Diagnosis not present

## 2024-07-04 NOTE — Progress Notes (Signed)
 Symptom Management Consult Note [facility]    Patient Care Team: Katrinka Garnette KIDD, MD as PCP - General (Family Medicine) Onesimo Emaline Brink, MD as Consulting Physician (Hematology) Bond, Reyes Mcardle, MD as Referring Physician (Ophthalmology) Evern Nian, Rogue, MD as Consulting Physician (Hematology and Oncology) Trixie File, MD as Consulting Physician (Internal Medicine) Eletha Boas, MD as Consulting Physician (General Surgery) Nicholaus Sherlean CROME, [facility] (Inactive) as Pharmacist (Pharmacist)    Name / MRN / DOB: Megan Olson  [id number]  11-08-1940   Date of visit: 07/04/2024   Chief Complaint/Reason for visit: cough   Current Therapy: Teclistamab  q 2 weeks   Last treatment:  Day 15   Cycle 12 on 06/21/24    ASSESSMENT AND PLAN Patient is a 83 y.o. 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 Onesimo.  I have viewed most recent oncology note and lab work.  #Multiple myeloma not having achieved remission  - Next appointment with oncologist is 07/19/24   #Cough - Chart review shows patient had chest xray on 06/21/24 for evaluation of cough showing no acute chest findings.  - Cough improved/nearly resolved after 2 courses of antibiotics. Clear lung exam. No fever, tachycardia or hypoxia. Left sided rib pain is reproducible on exam. - EKG performed and does not show acute changes when compared to EKG in 2021.  - Engaged in shared decision making with patient. She prefers to monitor symptoms and take OTC pain reliever and hold off on additional imaging at this time. Discussed ddx includes PE vs occult rib fracture vs muscular strain from coughing vs pneumonia. Lower suspicion for infection as cough improved and she does not have constitutional symptoms. No antibiotic needed at this time.   Strict ED precautions discussed should symptoms worsen.   HEME/ONC HISTORY Oncology History  Multiple myeloma  not having achieved remission (HCC)  12/12/2018 Initial Diagnosis   Multiple myeloma not having achieved remission (HCC)   12/20/2018 - 10/04/2019 Chemotherapy   The patient had dexamethasone  (DECADRON ) tablet 20 mg, 20 mg (100 % of original dose 20 mg), Oral, Once, 14 of 14 cycles Dose modification: 20 mg (original dose 20 mg, Cycle 1), 10 mg (original dose 20 mg, Cycle 14) Administration: 20 mg (12/20/2018), 20 mg (12/27/2018), 20 mg (01/10/2019), 20 mg (01/17/2019), 20 mg (01/31/2019), 20 mg (02/07/2019), 20 mg (03/14/2019), 20 mg (03/21/2019), 20 mg (02/21/2019), 20 mg (02/28/2019), 20 mg (04/04/2019), 20 mg (04/11/2019), 20 mg (04/25/2019), 20 mg (05/02/2019), 20 mg (05/16/2019), 20 mg (05/23/2019), 20 mg (06/06/2019), 20 mg (06/13/2019), 20 mg (06/27/2019), 20 mg (07/04/2019), 20 mg (07/18/2019), 20 mg (07/25/2019), 20 mg (08/08/2019), 20 mg (08/15/2019), 20 mg (08/29/2019), 20 mg (09/05/2019), 10 mg (09/19/2019) lenalidomide  (REVLIMID ) 15 MG capsule, 1 of 1 cycle, Start date: 01/18/2019, End date: 02/21/2019 bortezomib  SQ (VELCADE ) chemo injection 2 mg, 1.3 mg/m2 = 2 mg, Subcutaneous,  Once, 14 of 14 cycles Administration: 2 mg (12/20/2018), 2 mg (12/27/2018), 2 mg (12/23/2018), 2 mg (12/30/2018), 2 mg (01/10/2019), 2 mg (01/17/2019), 2 mg (01/13/2019), 2 mg (01/20/2019), 2 mg (01/31/2019), 2 mg (02/07/2019), 2 mg (02/03/2019), 2 mg (02/10/2019), 2 mg (03/14/2019), 2 mg (03/17/2019), 2 mg (03/21/2019), 2 mg (03/24/2019), 2 mg (02/21/2019), 2 mg (02/24/2019), 2 mg (02/28/2019), 2 mg (03/03/2019), 2 mg (04/04/2019), 2 mg (04/06/2019), 2 mg (04/11/2019), 2 mg (04/14/2019), 2 mg (04/25/2019), 2 mg (04/28/2019), 2 mg (05/02/2019), 2 mg (05/05/2019), 2 mg (05/16/2019), 2 mg (05/19/2019), 2 mg (05/23/2019), 2  mg (05/26/2019), 2 mg (06/06/2019), 2 mg (06/09/2019), 2 mg (06/13/2019), 2 mg (06/16/2019), 2 mg (06/27/2019), 2 mg (06/30/2019), 2 mg (07/04/2019), 2 mg (07/07/2019), 2 mg (07/18/2019), 2 mg (07/21/2019), 2 mg (07/25/2019), 2 mg (07/28/2019), 2 mg (08/08/2019), 2 mg (08/11/2019), 2  mg (08/15/2019), 2 mg (08/18/2019), 2 mg (08/29/2019), 2 mg (09/01/2019), 2 mg (09/05/2019), 2 mg (09/08/2019), 2 mg (09/19/2019), 2 mg (10/04/2019)  for chemotherapy treatment.    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)  04/16/2021 Initial Diagnosis   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)   04/30/2021 - 04/15/2022 Chemotherapy   Patient is on Treatment Plan : MYELOMA RELAPSED/REFRACTORY KCd q28d     05/12/2022 - 07/08/2022 Chemotherapy   Patient is on Treatment Plan : MYELOMA Daratumumab  + Pomalidomide  + Dexamethasone  q28d x 7 cycles     05/22/2022 - 05/18/2023 Chemotherapy   Patient is on Treatment Plan : MYELOMA Daratumumab  IV + Pomalidomide  + Dexamethasone  q28d x 7 cycles     03/15/2024 -  Chemotherapy   Patient is on Treatment Plan : MYELOMA Maintenance Teclistamab -cqyv SQ, D1,15 q28d         INTERVAL HISTORY  Discussed the use of AI scribe software for clinical note transcription with the patient, who gave verbal consent to proceed.    Megan Olson is a 83 y.o. female with oncologic history as above presenting to [facility] today with chief complaint of cough. Patient presents unaccompanied to visit today.   She has experienced a persistent cough since the end of August. Initially, the cough was productive with yellowish phlegm, but she has not produced any phlegm for the last week. She denies fever or chills. She is a non smoker. The cough has improved, with no nighttime coughing and only one instance of coughing this morning.  She describes associated chest pain that began in mid-September, located under the left breast and radiating to the left shoulder and back. The pain is sore and aching, exacerbated by deep breathing, coughing, or sneezing, and subsides with rest. Icing the area has provided some relief. The pain is rated as a 3-4 out of 10 when taking a deep breath.  She maintains her usual walking routine for daily exercise. She has no exertional chest pain. She reports eating and drinking well,  consuming healthy meals and 60 ounces of fluid daily. She took a few doses of a half tablet of a [employer] brand Excedrin which resolved her shoulder pain. She denies any falls or injury. She has history of hypertension otherwise denise cardiac history.    ROS  All other systems are reviewed and are negative for acute change except as noted in the HPI.    Allergies  Allergen Reactions   Ace Inhibitors Cough   Benadryl [Diphenhydramine] Other (See Comments)    Heart races   Diamox [Acetazolamide]     Hypotensive event at ophthalmologist   Sulfamethoxazole     REACTION: rash   Lenalidomide  Rash   Penicillins Rash    REACTION: rash Did it involve swelling of the face/tongue/throat, SOB, or low BP? Yes Did it involve sudden or severe rash/hives, skin peeling, or any reaction on the inside of your mouth or nose? No Did you need to seek medical attention at a hospital or doctor's office? No When did it last happen?      more than 10 years ago If all above answers are "NO", may proceed with cephalosporin use.      Past Medical History:  Diagnosis Date   Anemia  Blood transfusion without reported diagnosis Yrs ago when going through menopause   Cataract    Glaucoma    HYPERTENSION 03/11/2007   Multiple myeloma (HCC)    OSTEOPENIA 03/11/2007   Pre-diabetes    Thyroid  cancer (HCC)    Thyroid  disease    Tubular adenoma of colon 07/2015     Past Surgical History:  Procedure Laterality Date   BONE MARROW BIOPSY     multiple   BREAST EXCISIONAL BIOPSY Right 2000   BREAST LUMPECTOMY  1990   benign   CATARACT EXTRACTION Bilateral 2018   DILATION AND CURETTAGE OF UTERUS     bleeding at menopause. No uterine cancer   EYE SURGERY  2017, 2018. 2019   IR IMAGING GUIDED PORT INSERTION  05/16/2021   IR RADIOLOGIST EVAL & MGMT  12/13/2018   THYROIDECTOMY N/A 08/02/2020   Procedure: TOTAL THYROIDECTOMY;  Surgeon: Eletha Boas, MD;  Location: [facility];  Service: General;  Laterality:  N/A;   TONSILLECTOMY     age 30    Social History   Socioeconomic History   Marital status: Married    Spouse name: Not on file   Number of children: 1   Years of education: Not on file   Highest education level: Some college, no degree  Occupational History   Occupation: Retired  Tobacco Use   Smoking status: Former    Current packs/day: 0.00    Average packs/day: 0.5 packs/day for 7.0 years (3.5 ttl pk-yrs)    Types: Cigarettes    Start date: 01/04/1954    Quit date: 01/04/1961    Years since quitting: 63.5   Smokeless tobacco: Never  Vaping Use   Vaping status: Never Used  Substance and Sexual Activity   Alcohol use: Not Currently    Alcohol/week: 1.0 standard drink of alcohol    Types: 1 Standard drinks or equivalent per week    Comment: occas    Drug use: No   Sexual activity: Not Currently  Other Topics Concern   Not on file  Social History Narrative   Married. Lives with husband (patient of Dr. Katrinka). 1 son. No grandkids. 1 granddog.       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, swing dancing, dinner, read      Social Drivers of Health   Financial Resource Strain: Low Risk  (06/08/2024)   Overall Financial Resource Strain (CARDIA)    Difficulty of Paying Living Expenses: Not very hard  Food Insecurity: No Food Insecurity (06/08/2024)   Hunger Vital Sign    Worried About Running Out of Food in the Last Year: Never true    Ran Out of Food in the Last Year: Never true  Transportation Needs: Unknown (06/08/2024)   PRAPARE - Administrator, Civil Service (Medical): Not on file    Lack of Transportation (Non-Medical): No  Physical Activity: Insufficiently Active (06/08/2024)   Exercise Vital Sign    Days of Exercise per Week: 3 days    Minutes of Exercise per Session: 20 min  Stress: No Stress Concern Present (06/08/2024)   [employer] of Occupational Health -  Occupational Stress Questionnaire    Feeling of Stress: Only a little  Social Connections: Socially Integrated (06/08/2024)   Social Connection and Isolation Panel    Frequency of Communication with Friends and Family: More than three times a week    Frequency of Social  Gatherings with Friends and Family: Once a week    Attends Religious Services: More than 4 times per year    Active Member of [employer] or Organizations: Yes    Attends Engineer, structural: More than 4 times per year    Marital Status: Married  Catering manager Violence: Not At Risk (02/21/2024)   Humiliation, Afraid, Rape, and Kick questionnaire    Fear of Current or Ex-Partner: No    Emotionally Abused: No    Physically Abused: No    Sexually Abused: No    Family History  Problem Relation Age of Onset   Heart disease Mother        CHF mother died 70   Arthritis Mother    Glaucoma Mother        sister as well   Alcohol abuse Father    Suicidality Father    Heart disease Sister        aortic valve replacement   Lung cancer Sister        smoker   Cancer Sister    Hyperlipidemia Brother    Hypertension Brother    COPD Brother    Colon polyps Brother    Arthritis Sister    Hypertension Sister    Glaucoma Sister    Hashimoto's thyroiditis Sister    Colon polyps Sister    Diabetes Sister    Hypertension Son    Stroke Maternal Grandmother    Cystic fibrosis Niece    Colon cancer Neg Hx      Current Outpatient Medications:    acyclovir  (ZOVIRAX ) 400 MG tablet, Take 1 tablet (400 mg total) by mouth 2 (two) times daily., Disp: 60 tablet, Rfl: 3   ALPHAGAN  P 0.1 % SOLN, Place 1 drop into both eyes in the morning, at noon, and at bedtime. , Disp: , Rfl:    amLODipine  (NORVASC ) 10 MG tablet, Take 1 tablet (10 mg total) by mouth daily., Disp: 90 tablet, Rfl: 3   aspirin EC 81 MG tablet, Take 81 mg by mouth daily., Disp: , Rfl:    azithromycin  (ZITHROMAX ) 250 MG tablet, Take 2 tablets on day 1 and then 1  tablet on on days 2-5., Disp: 6 each, Rfl: 0   Calcium  Carb-Cholecalciferol (CALCIUM  600 + D PO), Take by mouth., Disp: , Rfl:    Cholecalciferol (VITAMIN D3) 125 MCG (5000 UT) TABS, Take 1,000 Units by mouth daily., Disp: , Rfl:    Co-Enzyme Q-10 100 MG CAPS, Take 100 mg by mouth daily., Disp: , Rfl:    dapsone  100 MG tablet, Take 1 tablet (100 mg total) by mouth daily., Disp: 30 tablet, Rfl: 3   dorzolamide -timolol  (COSOPT ) 22.3-6.8 MG/ML ophthalmic solution, Place 1 drop into both eyes 2 (two) times daily. , Disp: , Rfl: 11   fentaNYL  (DURAGESIC ) 12 MCG/HR, Place 1 patch onto the skin every 3 (three) days., Disp: 10 patch, Rfl: 0   filgrastim -sndz (ZARXIO ) 300 MCG/0.5ML SOSY injection, Inject 0.5 mLs (300 mcg total) into the skin 2 (two) times a week. On Monday and thursday, Disp: 4 mL, Rfl: 2   gabapentin  (NEURONTIN ) 100 MG capsule, Take 2 capsules (200 mg total) by mouth at bedtime., Disp: 60 capsule, Rfl: 0   levothyroxine  (SYNTHROID ) 88 MCG tablet, Take 1 tablet (88 mcg total) by mouth daily before breakfast., Disp: 90 tablet, Rfl: 3   lidocaine -prilocaine  (EMLA ) cream, Apply to affected area once, Disp: 30 g, Rfl: 3   magnesium  chloride (SLOW-MAG) 64 MG TBEC SR  tablet, Take by mouth., Disp: , Rfl:    Multiple Vitamins-Minerals (MULTIVITAMIN WITH MINERALS) tablet, Take 1 tablet by mouth daily. Centrum Silver 50 +, Disp: , Rfl:    Netarsudil-Latanoprost  (ROCKLATAN) 0.02-0.005 % SOLN, Place 1 drop into both eyes daily., Disp: , Rfl:    Omega-3 1000 MG CAPS, Take 1,000 mg by mouth daily. , Disp: , Rfl:    ondansetron  (ZOFRAN ) 8 MG tablet, Take 1 tablet (8 mg total) by mouth every 8 (eight) hours as needed for nausea or vomiting., Disp: 30 tablet, Rfl: 1   oxyCODONE -acetaminophen  (PERCOCET) 5-325 MG tablet, Take 1-2 tablets by mouth every 4 (four) hours as needed for moderate pain or severe pain., Disp: 90 tablet, Rfl: 0   polyethylene glycol (MIRALAX ) packet, Take 17 g by mouth daily., Disp: 30  each, Rfl: 1   prochlorperazine  (COMPAZINE ) 10 MG tablet, Take 1 tablet (10 mg total) by mouth every 6 (six) hours as needed for nausea or vomiting., Disp: 30 tablet, Rfl: 1   senna-docusate (EQ STOOL SOFTENER/LAXATIVE) 8.6-50 MG tablet, Take 1 tablet by mouth at bedtime., Disp: 60 tablet, Rfl: 2   Simethicone  (GAS-X PO), Take 1 tablet by mouth as needed., Disp: , Rfl:    teclistamab -cqyv SQ (TECVAYLI ) 153 MG/1.7ML, Inject 1.5 mg/kg into the skin once., Disp: , Rfl:    vitamin C (ASCORBIC ACID) 500 MG tablet, Take 500 mg by mouth 3 (three) times a week., Disp: , Rfl:   PHYSICAL EXAM ECOG FS:1 - Symptomatic but completely ambulatory    Vitals:   07/04/24 0909 07/04/24 0913 07/04/24 0924 07/04/24 0950  BP: (!) 154/72 135/72    Pulse: 77  71   Resp: 16  18 18   Temp: 98.7 F (37.1 C)     TempSrc: Temporal     SpO2: 91%  93% 95%  Weight: 107 lb (48.5 kg)     Height: 5' (1.524 m)      Physical Exam Vitals and nursing note reviewed.  Constitutional:      Appearance: She is not ill-appearing or toxic-appearing.  HENT:     Head: Normocephalic.     Nose: No congestion.  Eyes:     Conjunctiva/sclera: Conjunctivae normal.  Cardiovascular:     Rate and Rhythm: Normal rate and regular rhythm.     Pulses: Normal pulses.          Radial pulses are 2+ on the left side.     Heart sounds: Normal heart sounds.  Pulmonary:     Effort: Pulmonary effort is normal. No respiratory distress.     Breath sounds: Normal breath sounds. No stridor. No wheezing, rhonchi or rales.  Chest:       Comments: No anterior chest wall tenderness.  No deformity or crepitus noted.  No evidence of flail chest.   Abdominal:     General: There is no distension.  Musculoskeletal:     Cervical back: Normal range of motion.     Comments: Full ROM of left shoulder without pain. No swelling of left upper extremity.   Skin:    General: Skin is warm and dry.  Neurological:     Mental Status: She is alert.         LABORATORY DATA I have reviewed the data as listed    Latest Ref Rng & Units 06/21/2024    8:18 AM 06/07/2024   10:56 AM 05/24/2024   10:39 AM  CBC  WBC 4.0 - 10.5 K/uL 6.1  4.8  9.9  Hemoglobin 12.0 - 15.0 g/dL 8.8  9.6  9.2   Hematocrit 36.0 - 46.0 % 27.2  29.0  28.0   Platelets 150 - 400 K/uL 252  262  257         Latest Ref Rng & Units 06/21/2024    8:18 AM 06/07/2024   10:56 AM 05/24/2024   10:39 AM  CMP  Glucose 70 - 99 mg/dL 882  896  98   BUN 8 - 23 mg/dL 15  15  16    Creatinine 0.44 - 1.00 mg/dL 9.41  9.44  9.42   Sodium 135 - 145 mmol/L 139  138  138   Potassium 3.5 - 5.1 mmol/L 4.2  4.4  4.4   Chloride 98 - 111 mmol/L 105  105  106   CO2 22 - 32 mmol/L 30  27  27    Calcium  8.9 - 10.3 mg/dL 8.9  9.0  8.6   Total Protein 6.5 - 8.1 g/dL 5.7  5.9  5.6   Total Bilirubin 0.0 - 1.2 mg/dL 0.6  0.6  0.5   Alkaline Phos 38 - 126 U/L 58  55  64   AST 15 - 41 U/L 21  22  19    ALT 0 - 44 U/L 27  21  15         RADIOGRAPHIC STUDIES (from last 24 hours if applicable) I have personally reviewed the radiological images as listed and agreed with the findings in the report. No results found.      Visit Diagnosis: 1. Atypical chest pain   2. Multiple myeloma not having achieved remission (HCC)      Orders Placed This Encounter  Procedures   EKG 12-Lead    All questions were answered. The patient knows to call the clinic with any problems, questions or concerns. No barriers to learning was detected.  A total of more than 30 minutes were spent on this encounter with face-to-face time and non-face-to-face time, including preparing to see the patient, ordering tests and/or medications, counseling the patient and coordination of care as outlined above.    Thank you for allowing me to participate in the care of this patient.    Debara Kamphuis E  Walisiewicz, PA-C Department of Hematology/Oncology [facility] Phone: [phone]   [phone]    07/04/2024 11:41 AM

## 2024-07-05 ENCOUNTER — Encounter: Payer: Self-pay | Admitting: Hematology

## 2024-07-05 ENCOUNTER — Inpatient Hospital Stay

## 2024-07-05 ENCOUNTER — Inpatient Hospital Stay: Attending: Hematology

## 2024-07-05 VITALS — BP 133/72 | HR 75 | Temp 98.4°F | Resp 18 | Wt 107.8 lb

## 2024-07-05 DIAGNOSIS — I10 Essential (primary) hypertension: Secondary | ICD-10-CM | POA: Insufficient documentation

## 2024-07-05 DIAGNOSIS — Z7189 Other specified counseling: Secondary | ICD-10-CM

## 2024-07-05 DIAGNOSIS — M8580 Other specified disorders of bone density and structure, unspecified site: Secondary | ICD-10-CM | POA: Insufficient documentation

## 2024-07-05 DIAGNOSIS — Z79899 Other long term (current) drug therapy: Secondary | ICD-10-CM | POA: Insufficient documentation

## 2024-07-05 DIAGNOSIS — Z85850 Personal history of malignant neoplasm of thyroid: Secondary | ICD-10-CM | POA: Diagnosis not present

## 2024-07-05 DIAGNOSIS — C9002 Multiple myeloma in relapse: Secondary | ICD-10-CM | POA: Insufficient documentation

## 2024-07-05 DIAGNOSIS — Z801 Family history of malignant neoplasm of trachea, bronchus and lung: Secondary | ICD-10-CM | POA: Diagnosis not present

## 2024-07-05 DIAGNOSIS — Z7982 Long term (current) use of aspirin: Secondary | ICD-10-CM | POA: Diagnosis not present

## 2024-07-05 DIAGNOSIS — C9001 Multiple myeloma in remission: Secondary | ICD-10-CM | POA: Insufficient documentation

## 2024-07-05 DIAGNOSIS — Z79890 Hormone replacement therapy (postmenopausal): Secondary | ICD-10-CM | POA: Diagnosis not present

## 2024-07-05 DIAGNOSIS — Z5111 Encounter for antineoplastic chemotherapy: Secondary | ICD-10-CM | POA: Insufficient documentation

## 2024-07-05 LAB — CMP (CANCER CENTER ONLY)
ALT: 28 U/L (ref 0–44)
AST: 24 U/L (ref 15–41)
Albumin: 3.8 g/dL (ref 3.5–5.0)
Alkaline Phosphatase: 59 U/L (ref 38–126)
Anion gap: 4 — ABNORMAL LOW (ref 5–15)
BUN: 16 mg/dL (ref 8–23)
CO2: 28 mmol/L (ref 22–32)
Calcium: 8.6 mg/dL — ABNORMAL LOW (ref 8.9–10.3)
Chloride: 105 mmol/L (ref 98–111)
Creatinine: 0.71 mg/dL (ref 0.44–1.00)
GFR, Estimated: >60 mL/min (ref >60)
Glucose, Bld: 146 mg/dL — ABNORMAL HIGH (ref 70–99)
Potassium: 4.3 mmol/L (ref 3.5–5.1)
Sodium: 137 mmol/L (ref 135–145)
Total Bilirubin: 0.5 mg/dL (ref 0.0–1.2)
Total Protein: 5.5 g/dL — ABNORMAL LOW (ref 6.5–8.1)

## 2024-07-05 LAB — CBC WITH DIFFERENTIAL (CANCER CENTER ONLY)
Abs Immature Granulocytes: 0.11 K/uL — ABNORMAL HIGH (ref 0.00–0.07)
Basophils Absolute: 0.1 K/uL (ref 0.0–0.1)
Basophils Relative: 2 %
Eosinophils Absolute: 0.3 K/uL (ref 0.0–0.5)
Eosinophils Relative: 5 %
HCT: 24.6 % — ABNORMAL LOW (ref 36.0–46.0)
Hemoglobin: 8 g/dL — ABNORMAL LOW (ref 12.0–15.0)
Immature Granulocytes: 2 %
Lymphocytes Relative: 6 %
Lymphs Abs: 0.3 K/uL — ABNORMAL LOW (ref 0.7–4.0)
MCH: 34 pg (ref 26.0–34.0)
MCHC: 32.5 g/dL (ref 30.0–36.0)
MCV: 104.7 fL — ABNORMAL HIGH (ref 80.0–100.0)
Monocytes Absolute: 0.9 K/uL (ref 0.1–1.0)
Monocytes Relative: 17 %
Neutro Abs: 3.7 K/uL (ref 1.7–7.7)
Neutrophils Relative %: 68 %
Platelet Count: 381 K/uL (ref 150–400)
RBC: 2.35 MIL/uL — ABNORMAL LOW (ref 3.87–5.11)
RDW: 15.9 % — ABNORMAL HIGH (ref 11.5–15.5)
WBC Count: 5.3 K/uL (ref 4.0–10.5)
nRBC: 0 % (ref 0.0–0.2)

## 2024-07-05 MED ORDER — TECLISTAMAB-CQYV CHEMO 153 MG/1.7ML SQ SOLN
1.5000 mg/kg | Freq: Once | SUBCUTANEOUS | Status: AC
  Administered 2024-07-05: 70 mg via SUBCUTANEOUS
  Filled 2024-07-05: qty 0.78

## 2024-07-05 NOTE — Patient Instructions (Signed)
 [facility] - A DEPT OF [facility]  Discharge Instructions: Thank you for choosing [facility] to provide your oncology and hematology care.   If you have a lab appointment with the Cancer Center, please go directly to the Cancer Center and check in at the registration area.   Wear comfortable clothing and clothing appropriate for easy access to any Portacath or PICC line.   We strive to give you quality time with your provider. You may need to reschedule your appointment if you arrive late (15 or more minutes).  Arriving late affects you and other patients whose appointments are after yours.  Also, if you miss three or more appointments without notifying the office, you may be dismissed from the clinic at the provider's discretion.      For prescription refill requests, have your pharmacy contact our office and allow 72 hours for refills to be completed.    Today you received the following chemotherapy and/or immunotherapy agents: teclistamab      To help prevent nausea and vomiting after your treatment, we encourage you to take your nausea medication as directed.  BELOW ARE SYMPTOMS THAT SHOULD BE REPORTED IMMEDIATELY: *FEVER GREATER THAN 100.4 F (38 C) OR HIGHER *CHILLS OR SWEATING *NAUSEA AND VOMITING THAT IS NOT CONTROLLED WITH YOUR NAUSEA MEDICATION *UNUSUAL SHORTNESS OF BREATH *UNUSUAL BRUISING OR BLEEDING *URINARY PROBLEMS (pain or burning when urinating, or frequent urination) *BOWEL PROBLEMS (unusual diarrhea, constipation, pain near the anus) TENDERNESS IN MOUTH AND THROAT WITH OR WITHOUT PRESENCE OF ULCERS (sore throat, sores in mouth, or a toothache) UNUSUAL RASH, SWELLING OR PAIN  UNUSUAL VAGINAL DISCHARGE OR ITCHING   Items with * indicate a potential emergency and should be followed up as soon as possible or go to the Emergency Department if any problems should occur.  Please show the CHEMOTHERAPY ALERT CARD or  IMMUNOTHERAPY ALERT CARD at check-in to the Emergency Department and triage nurse.  Should you have questions after your visit or need to cancel or reschedule your appointment, please contact [facility] - A DEPT OF [facility]  Dept: [phone]  and follow the prompts.  Office hours are 8:00 a.m. to 4:30 p.m. Monday - Friday. Please note that voicemails left after 4:00 p.m. may not be returned until the following business day.  We are closed weekends and major holidays. You have access to a nurse at all times for urgent questions. Please call the main number to the clinic Dept: [phone] and follow the prompts.   For any non-urgent questions, you may also contact your provider using MyChart. We now offer e-Visits for anyone 18 and older to request care online for non-urgent symptoms. For details visit [email or web address].   Also download the MyChart app! Go to the app store, search "MyChart", open the app, select [facility], and log in with your MyChart username and password.

## 2024-07-13 ENCOUNTER — Other Ambulatory Visit: Payer: Self-pay

## 2024-07-13 DIAGNOSIS — C9000 Multiple myeloma not having achieved remission: Secondary | ICD-10-CM

## 2024-07-13 MED ORDER — FENTANYL 12 MCG/HR TD PT72: 1.0000 | MEDICATED_PATCH | TRANSDERMAL | 0 refills | Status: DC

## 2024-07-13 MED ORDER — GABAPENTIN 100 MG PO CAPS: 200.0000 mg | ORAL_CAPSULE | Freq: Every day | ORAL | 0 refills | Status: DC

## 2024-07-14 NOTE — Progress Notes (Signed)
 Megan Olson                                          MRN: [id number]   07/14/2024   The [facility] Quality Team Specialist reviewed this patient medical record for the purposes of chart review for care gap closure. The following were reviewed: abstraction for care gap closure-controlling blood pressure.    VBCI Quality Team

## 2024-07-19 ENCOUNTER — Inpatient Hospital Stay: Admitting: Hematology

## 2024-07-19 ENCOUNTER — Inpatient Hospital Stay

## 2024-07-19 VITALS — BP 129/79 | HR 75 | Temp 98.1°F | Resp 18 | Wt 106.1 lb

## 2024-07-19 DIAGNOSIS — C9002 Multiple myeloma in relapse: Secondary | ICD-10-CM

## 2024-07-19 DIAGNOSIS — Z5111 Encounter for antineoplastic chemotherapy: Secondary | ICD-10-CM

## 2024-07-19 DIAGNOSIS — Z95828 Presence of other vascular implants and grafts: Secondary | ICD-10-CM

## 2024-07-19 DIAGNOSIS — Z7189 Other specified counseling: Secondary | ICD-10-CM

## 2024-07-19 DIAGNOSIS — C9000 Multiple myeloma not having achieved remission: Secondary | ICD-10-CM

## 2024-07-19 DIAGNOSIS — D801 Nonfamilial hypogammaglobulinemia: Secondary | ICD-10-CM | POA: Diagnosis not present

## 2024-07-19 LAB — CMP (CANCER CENTER ONLY)
ALT: 64 U/L — ABNORMAL HIGH (ref 0–44)
AST: 42 U/L — ABNORMAL HIGH (ref 15–41)
Albumin: 4 g/dL (ref 3.5–5.0)
Alkaline Phosphatase: 60 U/L (ref 38–126)
Anion gap: 4 — ABNORMAL LOW (ref 5–15)
BUN: 13 mg/dL (ref 8–23)
CO2: 29 mmol/L (ref 22–32)
Calcium: 9.3 mg/dL (ref 8.9–10.3)
Chloride: 105 mmol/L (ref 98–111)
Creatinine: 0.6 mg/dL (ref 0.44–1.00)
GFR, Estimated: >60 mL/min (ref >60)
Glucose, Bld: 105 mg/dL — ABNORMAL HIGH (ref 70–99)
Potassium: 4.5 mmol/L (ref 3.5–5.1)
Sodium: 138 mmol/L (ref 135–145)
Total Bilirubin: 0.6 mg/dL (ref 0.0–1.2)
Total Protein: 5.7 g/dL — ABNORMAL LOW (ref 6.5–8.1)

## 2024-07-19 LAB — CBC WITH DIFFERENTIAL (CANCER CENTER ONLY)
Abs Immature Granulocytes: 0.12 K/uL — ABNORMAL HIGH (ref 0.00–0.07)
Basophils Absolute: 0.1 K/uL (ref 0.0–0.1)
Basophils Relative: 2 %
Eosinophils Absolute: 0.3 K/uL (ref 0.0–0.5)
Eosinophils Relative: 5 %
HCT: 27.7 % — ABNORMAL LOW (ref 36.0–46.0)
Hemoglobin: 9 g/dL — ABNORMAL LOW (ref 12.0–15.0)
Immature Granulocytes: 2 %
Lymphocytes Relative: 4 %
Lymphs Abs: 0.2 K/uL — ABNORMAL LOW (ref 0.7–4.0)
MCH: 34.4 pg — ABNORMAL HIGH (ref 26.0–34.0)
MCHC: 32.5 g/dL (ref 30.0–36.0)
MCV: 105.7 fL — ABNORMAL HIGH (ref 80.0–100.0)
Monocytes Absolute: 0.8 K/uL (ref 0.1–1.0)
Monocytes Relative: 15 %
Neutro Abs: 3.8 K/uL (ref 1.7–7.7)
Neutrophils Relative %: 72 %
Platelet Count: 347 K/uL (ref 150–400)
RBC: 2.62 MIL/uL — ABNORMAL LOW (ref 3.87–5.11)
RDW: 17.4 % — ABNORMAL HIGH (ref 11.5–15.5)
WBC Count: 5.3 K/uL (ref 4.0–10.5)
nRBC: 0 % (ref 0.0–0.2)

## 2024-07-19 MED ORDER — TECLISTAMAB-CQYV CHEMO 153 MG/1.7ML SQ SOLN
1.5000 mg/kg | Freq: Once | SUBCUTANEOUS | Status: AC
  Administered 2024-07-19: 70 mg via SUBCUTANEOUS
  Filled 2024-07-19: qty 0.78

## 2024-07-19 MED ORDER — ZOLEDRONIC ACID 4 MG/100ML IV SOLN
4.0000 mg | Freq: Once | INTRAVENOUS | Status: AC
  Administered 2024-07-19: 4 mg via INTRAVENOUS
  Filled 2024-07-19: qty 100

## 2024-07-19 NOTE — Patient Instructions (Signed)
 [facility] - A DEPT OF [facility]  Discharge Instructions: Thank you for choosing [facility] to provide your oncology and hematology care.   If you have a lab appointment with the Cancer Center, please go directly to the Cancer Center and check in at the registration area.   Wear comfortable clothing and clothing appropriate for easy access to any Portacath or PICC line.   We strive to give you quality time with your provider. You may need to reschedule your appointment if you arrive late (15 or more minutes).  Arriving late affects you and other patients whose appointments are after yours.  Also, if you miss three or more appointments without notifying the office, you may be dismissed from the clinic at the provider's discretion.      For prescription refill requests, have your pharmacy contact our office and allow 72 hours for refills to be completed.    Today you received the following chemotherapy and/or immunotherapy agents tecvayli , zometa       To help prevent nausea and vomiting after your treatment, we encourage you to take your nausea medication as directed.  BELOW ARE SYMPTOMS THAT SHOULD BE REPORTED IMMEDIATELY: *FEVER GREATER THAN 100.4 F (38 C) OR HIGHER *CHILLS OR SWEATING *NAUSEA AND VOMITING THAT IS NOT CONTROLLED WITH YOUR NAUSEA MEDICATION *UNUSUAL SHORTNESS OF BREATH *UNUSUAL BRUISING OR BLEEDING *URINARY PROBLEMS (pain or burning when urinating, or frequent urination) *BOWEL PROBLEMS (unusual diarrhea, constipation, pain near the anus) TENDERNESS IN MOUTH AND THROAT WITH OR WITHOUT PRESENCE OF ULCERS (sore throat, sores in mouth, or a toothache) UNUSUAL RASH, SWELLING OR PAIN  UNUSUAL VAGINAL DISCHARGE OR ITCHING   Items with * indicate a potential emergency and should be followed up as soon as possible or go to the Emergency Department if any problems should occur.  Please show the CHEMOTHERAPY ALERT CARD or  IMMUNOTHERAPY ALERT CARD at check-in to the Emergency Department and triage nurse.  Should you have questions after your visit or need to cancel or reschedule your appointment, please contact [facility] - A DEPT OF [facility]  Dept: [phone]  and follow the prompts.  Office hours are 8:00 a.m. to 4:30 p.m. Monday - Friday. Please note that voicemails left after 4:00 p.m. may not be returned until the following business day.  We are closed weekends and major holidays. You have access to a nurse at all times for urgent questions. Please call the main number to the clinic Dept: [phone] and follow the prompts.   For any non-urgent questions, you may also contact your provider using MyChart. We now offer e-Visits for anyone 81 and older to request care online for non-urgent symptoms. For details visit [email or web address].   Also download the MyChart app! Go to the app store, search MyChart, open the app, select [facility], and log in with your MyChart username and password.

## 2024-07-19 NOTE — Progress Notes (Signed)
 HEMATOLOGY ONCOLOGY PROGRESS NOTE  Date of service: 07/19/2024  Patient Care Team: Katrinka Garnette KIDD, MD as PCP - General (Family Medicine) Onesimo Emaline Brink, MD as Consulting Physician (Hematology) Bond, Reyes Mcardle, MD as Referring Physician (Ophthalmology) Evern Nian, Rogue, MD as Consulting Physician (Hematology and Oncology) Trixie File, MD as Consulting Physician (Internal Medicine) Eletha Boas, MD as Consulting Physician (General Surgery) Nicholaus Sherlean CROME, [facility] (Inactive) as Pharmacist (Pharmacist)  CHIEF COMPLAINT/PURPOSE OF CONSULTATION: Follow-up for continuation and management of Multiple Myeloma   HISTORY OF PRESENTING ILLNESS:  See previous noted for details on initial presentation  SUMMARY OF ONCOLOGIC HISTORY: Oncology History  Multiple myeloma not having achieved remission (HCC)  12/12/2018 Initial Diagnosis   Multiple myeloma not having achieved remission (HCC)   12/20/2018 - 10/04/2019 Chemotherapy   The patient had dexamethasone  (DECADRON ) tablet 20 mg, 20 mg (100 % of original dose 20 mg), Oral, Once, 14 of 14 cycles Dose modification: 20 mg (original dose 20 mg, Cycle 1), 10 mg (original dose 20 mg, Cycle 14) Administration: 20 mg (12/20/2018), 20 mg (12/27/2018), 20 mg (01/10/2019), 20 mg (01/17/2019), 20 mg (01/31/2019), 20 mg (02/07/2019), 20 mg (03/14/2019), 20 mg (03/21/2019), 20 mg (02/21/2019), 20 mg (02/28/2019), 20 mg (04/04/2019), 20 mg (04/11/2019), 20 mg (04/25/2019), 20 mg (05/02/2019), 20 mg (05/16/2019), 20 mg (05/23/2019), 20 mg (06/06/2019), 20 mg (06/13/2019), 20 mg (06/27/2019), 20 mg (07/04/2019), 20 mg (07/18/2019), 20 mg (07/25/2019), 20 mg (08/08/2019), 20 mg (08/15/2019), 20 mg (08/29/2019), 20 mg (09/05/2019), 10 mg (09/19/2019) lenalidomide  (REVLIMID ) 15 MG capsule, 1 of 1 cycle, Start date: 01/18/2019, End date: 02/21/2019 bortezomib  SQ (VELCADE ) chemo injection 2 mg, 1.3 mg/m2 = 2 mg, Subcutaneous,  Once, 14 of 14 cycles Administration: 2 mg  (12/20/2018), 2 mg (12/27/2018), 2 mg (12/23/2018), 2 mg (12/30/2018), 2 mg (01/10/2019), 2 mg (01/17/2019), 2 mg (01/13/2019), 2 mg (01/20/2019), 2 mg (01/31/2019), 2 mg (02/07/2019), 2 mg (02/03/2019), 2 mg (02/10/2019), 2 mg (03/14/2019), 2 mg (03/17/2019), 2 mg (03/21/2019), 2 mg (03/24/2019), 2 mg (02/21/2019), 2 mg (02/24/2019), 2 mg (02/28/2019), 2 mg (03/03/2019), 2 mg (04/04/2019), 2 mg (04/06/2019), 2 mg (04/11/2019), 2 mg (04/14/2019), 2 mg (04/25/2019), 2 mg (04/28/2019), 2 mg (05/02/2019), 2 mg (05/05/2019), 2 mg (05/16/2019), 2 mg (05/19/2019), 2 mg (05/23/2019), 2 mg (05/26/2019), 2 mg (06/06/2019), 2 mg (06/09/2019), 2 mg (06/13/2019), 2 mg (06/16/2019), 2 mg (06/27/2019), 2 mg (06/30/2019), 2 mg (07/04/2019), 2 mg (07/07/2019), 2 mg (07/18/2019), 2 mg (07/21/2019), 2 mg (07/25/2019), 2 mg (07/28/2019), 2 mg (08/08/2019), 2 mg (08/11/2019), 2 mg (08/15/2019), 2 mg (08/18/2019), 2 mg (08/29/2019), 2 mg (09/01/2019), 2 mg (09/05/2019), 2 mg (09/08/2019), 2 mg (09/19/2019), 2 mg (10/04/2019)  for chemotherapy treatment.    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)  04/16/2021 Initial Diagnosis   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)   04/30/2021 - 04/15/2022 Chemotherapy   Patient is on Treatment Plan : MYELOMA RELAPSED/REFRACTORY KCd q28d     05/12/2022 - 07/08/2022 Chemotherapy   Patient is on Treatment Plan : MYELOMA Daratumumab  + Pomalidomide  + Dexamethasone  q28d x 7 cycles     05/22/2022 - 05/18/2023 Chemotherapy   Patient is on Treatment Plan : MYELOMA Daratumumab  IV + Pomalidomide  + Dexamethasone  q28d x 7 cycles     03/15/2024 -  Chemotherapy   Patient is on Treatment Plan : MYELOMA Maintenance Teclistamab -cqyv SQ, D1,15 q28d      CURRENT THERAPY: as of 07/19/2024 she is on Cycle 12 / Day 15 of Teclistamab -cqyv SQ, D1,15 q28d   INTERVAL  HISTORY:  Megan Olson is a 83 y.o. female who is here today for continuation and management of Multiple Myeloma.   she was last seen by me on 05/24/2024; at the time she did not have any concerns and was doing  well.   On 06/21/2024, she was seen by [name], PA-C and reported experiencing fatigue, but able to complete ADLs without assistance. Also noted that she had recently completed course of antibiotic therapy with residual congestion and productive cough (mainly when she is lying down for sleep) with yellow sputum - denied fevers, sore throat, nausea, vomiting, bowel habit changes, or post nasal drip, but was having mild shortness of breath, mainly with exertion. Lastly, endorses ongoing back pain controlled with Fentanyl  patch and breakthrough Percocet as needed.   On 07/04/2024, she was seen by Walisiewicz, Kaitlyn E, PA-C for Atypical Chest Pain. Per the note, she reported that she has experienced a persistent cough (initially was productive with yellowish phlegm, but had not produced any phlegm for the last week) since the end of August. Denied fever or chills. She is a non smoker. At that time, the cough had improved, with no nighttime coughing and only one instance of coughing the morning of her appointment. Described associated chest pain (sore and aching, exacerbated by deep breathing - rated 3-4/10 when taking a deep breath-,  coughing, or sneezing, and subsides with rest) that began in mid-September, located under the left breast and radiating to the left shoulder and back - icing the area provided some relief and a few doses of a half tablet of a [employer] brand Excedrin resolved her shoulder pain. She was able to maintain her usual walking routine for daily exercise without exertional chest pain. Was not having issues with eating/hydrating. Denies any falls or injury prior to onset of chest pain. Only notably cardiac history being Hypertension.  Today, she says that is still experiencing persistence of cough, which is mainly non-productive with only occasional sputum productive, however this is improving. She has been treated with antibiotics a couple of times for this without complete resolution.  Reports that she has not been on Zarzio for about 2 months now.  Denies any fevers/chills or SOB. 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  - Regarding discontinuation of Gabapentin , I do not recommend this at this time as she only continues to experience lower lip numbness and has not began experiencing more concerning symptoms, such as tingling/numbness in extremities or back pain shooting down leg.   - Answered questions regarding Ivermectin as a potential cancer treatment   - For congestion, she can use Mucinex/Guaifenesin   When coughing/yawning she experiencing mild pain in her chest up to her left should, but this is also improving   REVIEW OF SYSTEMS:    10 Point review of systems of done and is negative except as noted above.  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 No uterine cancer   EYE SURGERY  2017, 2018. 2019   IR IMAGING GUIDED PORT INSERTION  05/16/2021   IR RADIOLOGIST EVAL & MGMT  12/13/2018   THYROIDECTOMY N/A 08/02/2020   Procedure: TOTAL THYROIDECTOMY;  Surgeon: Eletha Boas, MD;  Location: [facility];  Service: General;  Laterality: N/A;   TONSILLECTOMY     age  11    SOCIAL HISTORY Social History   Tobacco Use   Smoking status: Former    Current packs/day: 0.00    Average packs/day: 0.5 packs/day for 7.0 years (3.5 ttl pk-yrs)    Types: Cigarettes    Start date: 01/04/1954    Quit date: 01/04/1961    Years since quitting: 63.5   Smokeless tobacco: Never  Vaping Use   Vaping status: Never Used  Substance Use Topics   Alcohol use: Not Currently    Alcohol/week: 1.0 standard drink of alcohol    Types: 1 Standard drinks or equivalent per week    Comment: occas    Drug use: No   Social History   Social History Narrative   Married. Lives with husband (patient of Dr. Katrinka). 1 son. No grandkids. 1 granddog.       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, swing dancing, dinner, read      IMMUNIZATION HISTORY Immunization History  Administered Date(s) Administered   Fluad Quad(high Dose 65+) 06/13/2019, 07/03/2020, 06/17/2021, 06/25/2022   INFLUENZA, HIGH DOSE SEASONAL PF 07/22/2016, 06/24/2017, 06/23/2018, 08/10/2022   Influenza Split 07/19/2012   Influenza Whole 07/17/2008, 06/28/2009, 06/25/2010   Influenza,inj,Quad PF,6+ Mos 06/27/2013, 06/18/2014   Influenza-Unspecified 07/03/2015   PFIZER Comirnaty(Gray Top)Covid-19 Tri-Sucrose Vaccine 06/13/2023   PFIZER(Purple Top)SARS-COV-2 Vaccination 11/09/2019, 12/04/2019, 06/03/2020, 01/09/2021, 07/04/2021, 07/03/2022   PNEUMOCOCCAL CONJUGATE-20 05/21/2021   Pneumococcal Conjugate-13 03/29/2015   Pneumococcal Polysaccharide-23 04/04/2006   Respiratory Syncytial Virus Vaccine,Recomb Aduvanted(Arexvy) 08/10/2022   Td 02/10/2010   Tdap 04/23/2023   Zoster Recombinant(Shingrix ) 03/27/2020, 09/06/2020   Zoster, Live 09/26/2008    SOCIAL DRIVERS OF HEALTH SDOH Screenings   Food Insecurity: No Food Insecurity (06/08/2024)  Housing: Unknown (06/08/2024)  Transportation Needs: Unknown (06/08/2024)  Utilities:  Not At Risk (02/21/2024)  Alcohol Screen: Low Risk  (06/08/2024)  Depression (PHQ2-9): Low Risk  (07/19/2024)  Financial Resource Strain: Low Risk  (06/08/2024)  Physical Activity: Insufficiently Active (06/08/2024)  Social Connections: Socially Integrated (06/08/2024)  Stress: No Stress Concern Present (06/08/2024)  Tobacco Use: Medium Risk (06/19/2024)   Received from [facility]  Health Literacy: Adequate Health Literacy (02/21/2024)     FAMILY HISTORY Family History  Problem Relation Age of Onset   Heart disease Mother        CHF mother died 53   Arthritis Mother    Glaucoma Mother        sister as well   Alcohol abuse Father    Suicidality Father    Heart disease Sister        aortic valve replacement   Lung cancer Sister        smoker   Cancer Sister  Hyperlipidemia Brother    Hypertension Brother    COPD Brother    Colon polyps Brother    Arthritis Sister    Hypertension Sister    Glaucoma Sister    Hashimoto's thyroiditis Sister    Colon polyps Sister    Diabetes Sister    Hypertension Son    Stroke Maternal Grandmother    Cystic fibrosis Niece    Colon cancer Neg Hx      ALLERGIES: is allergic to ace inhibitors, benadryl [diphenhydramine], diamox [acetazolamide], sulfamethoxazole, lenalidomide , and penicillins.  MEDICATIONS  Current Outpatient Medications  Medication Sig Dispense Refill   acyclovir  (ZOVIRAX ) 400 MG tablet Take 1 tablet (400 mg total) by mouth 2 (two) times daily. 60 tablet 3   ALPHAGAN  P 0.1 % SOLN Place 1 drop into both eyes in the morning, at noon, and at bedtime.      amLODipine  (NORVASC ) 10 MG tablet Take 1 tablet (10 mg total) by mouth daily. 90 tablet 3   aspirin EC 81 MG tablet Take 81 mg by mouth daily.     b complex vitamins capsule Take 1 capsule by mouth daily.     Calcium  Carb-Cholecalciferol (CALCIUM  600 + D PO) Take by mouth.     Cholecalciferol (VITAMIN D3) 125 MCG (5000 UT) TABS Take 1,000 Units by mouth daily.     Co-Enzyme  Q-10 100 MG CAPS Take 100 mg by mouth daily.     cyanocobalamin  (VITAMIN B12) 500 MCG tablet Take 500 mcg by mouth every other day.     dapsone  100 MG tablet Take 1 tablet (100 mg total) by mouth daily. 30 tablet 3   dorzolamide -timolol  (COSOPT ) 22.3-6.8 MG/ML ophthalmic solution Place 1 drop into both eyes 2 (two) times daily.   11   fentaNYL  (DURAGESIC ) 12 MCG/HR Place 1 patch onto the skin every 3 (three) days. 10 patch 0   gabapentin  (NEURONTIN ) 100 MG capsule Take 2 capsules (200 mg total) by mouth at bedtime. 60 capsule 0   levothyroxine  (SYNTHROID ) 88 MCG tablet Take 1 tablet (88 mcg total) by mouth daily before breakfast. 90 tablet 3   lidocaine -prilocaine  (EMLA ) cream Apply to affected area once 30 g 3   magnesium  chloride (SLOW-MAG) 64 MG TBEC SR tablet Take by mouth.     Multiple Vitamins-Minerals (MULTIVITAMIN WITH MINERALS) tablet Take 1 tablet by mouth daily. Centrum Silver 50 +     Netarsudil-Latanoprost  (ROCKLATAN) 0.02-0.005 % SOLN Place 1 drop into both eyes daily.     Omega-3 1000 MG CAPS Take 1,000 mg by mouth daily.      ondansetron  (ZOFRAN ) 8 MG tablet Take 1 tablet (8 mg total) by mouth every 8 (eight) hours as needed for nausea or vomiting. 30 tablet 1   oxyCODONE -acetaminophen  (PERCOCET) 5-325 MG tablet Take 1-2 tablets by mouth every 4 (four) hours as needed for moderate pain or severe pain. 90 tablet 0   polyethylene glycol (MIRALAX ) packet Take 17 g by mouth daily. 30 each 1   prochlorperazine  (COMPAZINE ) 10 MG tablet Take 1 tablet (10 mg total) by mouth every 6 (six) hours as needed for nausea or vomiting. 30 tablet 1   senna-docusate (EQ STOOL SOFTENER/LAXATIVE) 8.6-50 MG tablet Take 1 tablet by mouth at bedtime. 60 tablet 2   Simethicone  (GAS-X PO) Take 1 tablet by mouth as needed.     teclistamab -cqyv SQ (TECVAYLI ) 153 MG/1.7ML Inject 1.5 mg/kg into the skin once.     vitamin C (ASCORBIC ACID) 500 MG tablet Take 500  mg by mouth 3 (three) times a week.      azithromycin  (ZITHROMAX ) 250 MG tablet Take 2 tablets on day 1 and then 1 tablet on on days 2-5. (Patient not taking: Reported on 07/19/2024) 6 each 0   filgrastim -sndz (ZARXIO ) 300 MCG/0.5ML SOSY injection Inject 0.5 mLs (300 mcg total) into the skin 2 (two) times a week. On Monday and thursday (Patient not taking: Reported on 07/19/2024) 4 mL 2   No current facility-administered medications for this visit.    PHYSICAL EXAMINATION: ECOG PERFORMANCE STATUS: 1 - Symptomatic but completely ambulatory VITALS: Vitals:   07/19/24 0929  BP: 129/79  Pulse: 75  Resp: 18  Temp: 98.1 F (36.7 C)  SpO2: 92%   Filed Weights   07/19/24 0929  Weight: 106 lb 1.6 oz (48.1 kg)   Body mass index is 20.72 kg/m.  GENERAL: alert, in no acute distress and comfortable SKIN: no acute rashes, no significant lesions EYES: conjunctiva are pink and non-injected, sclera anicteric OROPHARYNX: MMM, no exudates, no oropharyngeal erythema or ulceration NECK: supple, no JVD LYMPH:  no palpable lymphadenopathy in the cervical, axillary or inguinal regions LUNGS: clear to auscultation b/l with normal respiratory effort HEART: regular rate & rhythm ABDOMEN:  normoactive bowel sounds , non tender, not distended, no hepatosplenomegaly Extremity: no pedal edema PSYCH: alert & oriented x 3 with fluent speech NEURO: no focal motor/sensory deficits  LABORATORY DATA:   I have reviewed the data as listed     Latest Ref Rng & Units 07/19/2024    8:48 AM 07/05/2024   12:41 PM 06/21/2024    8:18 AM  CBC EXTENDED  WBC 4.0 - 10.5 K/uL 5.3  5.3  6.1   RBC 3.87 - 5.11 MIL/uL 2.62  2.35  2.57   Hemoglobin 12.0 - 15.0 g/dL 9.0  8.0  8.8   HCT 63.9 - 46.0 % 27.7  24.6  27.2   Platelets 150 - 400 K/uL 347  381  252   NEUT# 1.7 - 7.7 K/uL 3.8  3.7  4.5   Lymph# 0.7 - 4.0 K/uL 0.2  0.3  0.3       Latest Ref Rng & Units 07/19/2024    8:48 AM 07/05/2024   12:41 PM 06/21/2024    8:18 AM  CMP  Glucose 70 - 99 mg/dL 894   853  882   BUN 8 - 23 mg/dL 13  16  15    Creatinine 0.44 - 1.00 mg/dL 9.39  9.28  9.41   Sodium 135 - 145 mmol/L 138  137  139   Potassium 3.5 - 5.1 mmol/L 4.5  4.3  4.2   Chloride 98 - 111 mmol/L 105  105  105   CO2 22 - 32 mmol/L 29  28  30    Calcium  8.9 - 10.3 mg/dL 9.3  8.6  8.9   Total Protein 6.5 - 8.1 g/dL 5.7  5.5  5.7   Total Bilirubin 0.0 - 1.2 mg/dL 0.6  0.5  0.6   Alkaline Phos 38 - 126 U/L 60  59  58   AST 15 - 41 U/L 42  24  21   ALT 0 - 44 U/L 64  28  27    MULTIPLE MYELOMA PANEL 02/2023 - 06/2024 AND KAPPA/LAMDA LIGHT CHAINS 02/2022 - 06/2024    RADIOGRAPHIC STUDIES: I have personally reviewed the radiological images as listed and agreed with the findings in the report. DG Chest 2 View Result Date: 06/21/2024 CLINICAL DATA:  Dry cough. EXAM: CHEST - 2 VIEW COMPARISON:  Radiograph 08/01/2020. FINDINGS: Right chest port with tip in the SVC. Normal heart size with stable mediastinal contours. No focal airspace disease. Normal pulmonary vasculature. No pleural fluid or pneumothorax. The bones are diffusely under mineralized. Numerous compression deformities in the thoracic spine, grossly unchanged from prior exam. IMPRESSION: 1. No acute chest findings or explanation for cough. 2. Right chest port with tip in the SVC. Electronically Signed   By: Andrea Gasman M.D.   On: 06/21/2024 13:32     ASSESSMENT & PLAN:  83 y.o. female with  1. 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 -Transitioned to PO Cytoxan /Velcade /Dex of each 21-day cycle in May 2020 -Transitioned to maintenance ixazomib therapy in December 2020. -Due to progression of disease in July 2022, started Kyprolis /Dex therapy -Due to  progression of disease in August 2023, switched to Dara/Pom/Dex -Due to progression of disease in September 2024, switched to elotuzumab /velcade /dex -Due to progression of disease in October 2024, switched to teclistamab  at [facility]. Tolerated step up therapy without CRS or ICANS.  -Cycle 8, Day 1 of Teclistamab  was delayed by one week 2/2 neutropenia, started on Zarxio  M/W/F of off weeks of therapy.  -Transitioned to Zarxio  Mon/Thurs weekly starting Cycle 9 of Teclistamab .    2.  Cancer related pain  -Currently well controlled -Continue fentanyl  patch at 12 mcg/h and Percocet for break through pain.      PLAN: - Patient's labs from today were discussed in detail with her CBC is stable with a hemoglobin of 9.0, normal WBC count of 5.3 (stable since stopping Zarzio) with an ANC of 3.8L and platelets of 347k. CMP stable Myeloma labs today show no monoclonal protein spike IFE shows IgG lambda protein  Will continue to monitor this.  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   Will need to continue to monitor for infections and have low threshold to start IVIG.  Discussed mechanism of immunoglobulins and how BiTE therapy affects these.  Persisting cough is possibly related to this due to significant immunocompromising effect of BiTE therapy.   Discussed IVIG and most common side effect of headaches, which are managed with steroids.  Serum kappa lambda free light chains are undetectable at this time.  Patient notes no new toxicities from her Tecvayli  -She will continue maintenance Tecvayli  at every 2 weeks and patient is appropriate to proceed with treatment with the same supportive medications today Continue dapsone  for PJP prophylaxis Continue acyclovir  Continue vitamin D  Continue current pain management  - Recommend updating vaccines for increased immunity against contractory infections  Hold off  for about 1 week after starting IVIG.  FOLLOW-UP: -Continue Teclistamab  as per orders every 2 weeks with labs and visit.  -starting IVIG every 4 weeks ASAP -Continue Zometa  every 12 weeks -MD visit in 4 weeks   The total time spent in the appointment was 40 minutes* .  All of the patient's questions were answered and the patient knows to call the clinic with any problems, questions, or concerns.  Emaline Saran MD MS AAHIVMS [facility] Hematology/Oncology Physician [facility]  *Total Encounter Time as defined by the Centers for  Medicare and Medicaid Services includes, in addition to the face-to-face time of a patient visit (documented in the note above) non-face-to-face time: obtaining and reviewing outside history, ordering and reviewing medications, tests or procedures, care coordination (communications with other health care professionals or caregivers) and documentation in the medical record.  I,Emily Lagle,acting as a Neurosurgeon for Emaline Saran, MD.,have documented all relevant documentation on the behalf of Emaline Saran, MD,as directed by  Emaline Saran, MD while in the presence of Emaline Saran, MD.  I have reviewed the above documentation for accuracy and completeness, and I agree with the above.  Jeromiah Ohalloran, MD

## 2024-07-20 LAB — KAPPA/LAMBDA LIGHT CHAINS
Kappa free light chain: <0.7 mg/L — ABNORMAL LOW (ref 3.3–19.4)
Kappa, lambda light chain ratio: UNDETERMINED
Lambda free light chains: <1.5 mg/L — ABNORMAL LOW (ref 5.7–26.3)

## 2024-07-24 LAB — MULTIPLE MYELOMA PANEL, SERUM
Albumin SerPl Elph-Mcnc: 3.5 g/dL (ref 2.9–4.4)
Albumin/Glob SerPl: 1.9 — ABNORMAL HIGH (ref 0.7–1.7)
Alpha 1: 0.4 g/dL (ref 0.0–0.4)
Alpha2 Glob SerPl Elph-Mcnc: 0.6 g/dL (ref 0.4–1.0)
B-Globulin SerPl Elph-Mcnc: 0.9 g/dL (ref 0.7–1.3)
Gamma Glob SerPl Elph-Mcnc: 0.1 g/dL — ABNORMAL LOW (ref 0.4–1.8)
Globulin, Total: 1.9 g/dL — ABNORMAL LOW (ref 2.2–3.9)
IgA: <5 mg/dL — ABNORMAL LOW (ref 64–422)
IgG (Immunoglobin G), Serum: 78 mg/dL — ABNORMAL LOW (ref 586–1602)
IgM (Immunoglobulin M), Srm: <5 mg/dL — ABNORMAL LOW (ref 26–217)
Total Protein ELP: 5.4 g/dL — ABNORMAL LOW (ref 6.0–8.5)

## 2024-07-25 ENCOUNTER — Encounter: Payer: Self-pay | Admitting: Hematology

## 2024-07-26 ENCOUNTER — Other Ambulatory Visit: Payer: Self-pay

## 2024-07-26 DIAGNOSIS — C9002 Multiple myeloma in relapse: Secondary | ICD-10-CM

## 2024-07-26 DIAGNOSIS — Z7189 Other specified counseling: Secondary | ICD-10-CM

## 2024-07-26 MED ORDER — ACYCLOVIR 400 MG PO TABS: 400.0000 mg | ORAL_TABLET | Freq: Two times a day (BID) | ORAL | 3 refills | Status: AC

## 2024-08-02 ENCOUNTER — Inpatient Hospital Stay

## 2024-08-02 ENCOUNTER — Encounter: Payer: Self-pay | Admitting: Hematology

## 2024-08-02 ENCOUNTER — Other Ambulatory Visit: Payer: Self-pay | Admitting: Hematology

## 2024-08-02 VITALS — BP 141/75 | HR 73 | Temp 98.4°F | Resp 16 | Wt 105.0 lb

## 2024-08-02 DIAGNOSIS — Z7189 Other specified counseling: Secondary | ICD-10-CM

## 2024-08-02 DIAGNOSIS — C9002 Multiple myeloma in relapse: Secondary | ICD-10-CM

## 2024-08-02 DIAGNOSIS — Z5111 Encounter for antineoplastic chemotherapy: Secondary | ICD-10-CM | POA: Diagnosis not present

## 2024-08-02 LAB — CBC WITH DIFFERENTIAL (CANCER CENTER ONLY)
Abs Immature Granulocytes: 0.06 K/uL (ref 0.00–0.07)
Basophils Absolute: 0.1 K/uL (ref 0.0–0.1)
Basophils Relative: 3 %
Eosinophils Absolute: 0.3 K/uL (ref 0.0–0.5)
Eosinophils Relative: 7 %
HCT: 27.1 % — ABNORMAL LOW (ref 36.0–46.0)
Hemoglobin: 9.2 g/dL — ABNORMAL LOW (ref 12.0–15.0)
Immature Granulocytes: 2 %
Lymphocytes Relative: 7 %
Lymphs Abs: 0.3 K/uL — ABNORMAL LOW (ref 0.7–4.0)
MCH: 35.2 pg — ABNORMAL HIGH (ref 26.0–34.0)
MCHC: 33.9 g/dL (ref 30.0–36.0)
MCV: 103.8 fL — ABNORMAL HIGH (ref 80.0–100.0)
Monocytes Absolute: 0.8 K/uL (ref 0.1–1.0)
Monocytes Relative: 20 %
Neutro Abs: 2.4 K/uL (ref 1.7–7.7)
Neutrophils Relative %: 61 %
Platelet Count: 277 K/uL (ref 150–400)
RBC: 2.61 MIL/uL — ABNORMAL LOW (ref 3.87–5.11)
RDW: 17.4 % — ABNORMAL HIGH (ref 11.5–15.5)
WBC Count: 3.8 K/uL — ABNORMAL LOW (ref 4.0–10.5)
nRBC: 0 % (ref 0.0–0.2)

## 2024-08-02 LAB — CMP (CANCER CENTER ONLY)
ALT: 88 U/L — ABNORMAL HIGH (ref 0–44)
AST: 42 U/L — ABNORMAL HIGH (ref 15–41)
Albumin: 4.1 g/dL (ref 3.5–5.0)
Alkaline Phosphatase: 53 U/L (ref 38–126)
Anion gap: 5 (ref 5–15)
BUN: 16 mg/dL (ref 8–23)
CO2: 28 mmol/L (ref 22–32)
Calcium: 9 mg/dL (ref 8.9–10.3)
Chloride: 104 mmol/L (ref 98–111)
Creatinine: 0.71 mg/dL (ref 0.44–1.00)
GFR, Estimated: >60 mL/min (ref >60)
Glucose, Bld: 126 mg/dL — ABNORMAL HIGH (ref 70–99)
Potassium: 4.2 mmol/L (ref 3.5–5.1)
Sodium: 137 mmol/L (ref 135–145)
Total Bilirubin: 0.5 mg/dL (ref 0.0–1.2)
Total Protein: 5.8 g/dL — ABNORMAL LOW (ref 6.5–8.1)

## 2024-08-02 MED ORDER — TECLISTAMAB-CQYV CHEMO 153 MG/1.7ML SQ SOLN
1.5000 mg/kg | Freq: Once | SUBCUTANEOUS | Status: AC
  Administered 2024-08-02: 70 mg via SUBCUTANEOUS
  Filled 2024-08-02: qty 0.78

## 2024-08-02 NOTE — Patient Instructions (Signed)
 [facility] A DEPT OF [place]  Discharge Instructions: Thank you for choosing [facility] to provide your oncology and hematology care.   If you have a lab appointment with the Cancer Center, please go directly to the Cancer Center and check in at the registration area.   Wear comfortable clothing and clothing appropriate for easy access to any Portacath or PICC line.   We strive to give you quality time with your provider. You may need to reschedule your appointment if you arrive late (15 or more minutes).  Arriving late affects you and other patients whose appointments are after yours.  Also, if you miss three or more appointments without notifying the office, you may be dismissed from the clinic at the provider's discretion.      For prescription refill requests, have your pharmacy contact our office and allow 72 hours for refills to be completed.    Today you received the following chemotherapy and/or immunotherapy agents tecvayli       To help prevent nausea and vomiting after your treatment, we encourage you to take your nausea medication as directed.  BELOW ARE SYMPTOMS THAT SHOULD BE REPORTED IMMEDIATELY: *FEVER GREATER THAN 100.4 F (38 C) OR HIGHER *CHILLS OR SWEATING *NAUSEA AND VOMITING THAT IS NOT CONTROLLED WITH YOUR NAUSEA MEDICATION *UNUSUAL SHORTNESS OF BREATH *UNUSUAL BRUISING OR BLEEDING *URINARY PROBLEMS (pain or burning when urinating, or frequent urination) *BOWEL PROBLEMS (unusual diarrhea, constipation, pain near the anus) TENDERNESS IN MOUTH AND THROAT WITH OR WITHOUT PRESENCE OF ULCERS (sore throat, sores in mouth, or a toothache) UNUSUAL RASH, SWELLING OR PAIN  UNUSUAL VAGINAL DISCHARGE OR ITCHING   Items with * indicate a potential emergency and should be followed up as soon as possible or go to the Emergency Department if any problems should occur.  Please show the CHEMOTHERAPY ALERT CARD or IMMUNOTHERAPY  ALERT CARD at check-in to the Emergency Department and triage nurse.  Should you have questions after your visit or need to cancel or reschedule your appointment, please contact [facility] DE[facility]  Dept: [phone]  and follow the prompts.  Office hours are 8:00 a.m. to 4:30 p.m. Monday - Friday. Please note that voicemails left after 4:00 p.m. may not be returned until the following business day.  We are closed weekends and major holidays. You have access to a nurse at all times for urgent questions. Please call the main number to the clinic Dept: [phone] and follow the prompts.   For any non-urgent questions, you may also contact your provider using MyChart. We now offer e-Visits for anyone 71 and older to request care online for non-urgent symptoms. For details visit [email or web address].   Also download the MyChart app! Go to the app store, search MyChart, open the app, select [place], and log in with your MyChart username and password.

## 2024-08-09 ENCOUNTER — Inpatient Hospital Stay: Attending: Hematology

## 2024-08-09 ENCOUNTER — Encounter: Payer: Self-pay | Admitting: Hematology

## 2024-08-09 VITALS — BP 132/78 | HR 70 | Temp 98.7°F | Resp 16

## 2024-08-09 DIAGNOSIS — H538 Other visual disturbances: Secondary | ICD-10-CM | POA: Insufficient documentation

## 2024-08-09 DIAGNOSIS — G893 Neoplasm related pain (acute) (chronic): Secondary | ICD-10-CM | POA: Insufficient documentation

## 2024-08-09 DIAGNOSIS — R7989 Other specified abnormal findings of blood chemistry: Secondary | ICD-10-CM | POA: Diagnosis not present

## 2024-08-09 DIAGNOSIS — Z79899 Other long term (current) drug therapy: Secondary | ICD-10-CM | POA: Diagnosis not present

## 2024-08-09 DIAGNOSIS — Z7189 Other specified counseling: Secondary | ICD-10-CM

## 2024-08-09 DIAGNOSIS — Z5111 Encounter for antineoplastic chemotherapy: Secondary | ICD-10-CM | POA: Diagnosis present

## 2024-08-09 DIAGNOSIS — Z85850 Personal history of malignant neoplasm of thyroid: Secondary | ICD-10-CM | POA: Insufficient documentation

## 2024-08-09 DIAGNOSIS — Z95828 Presence of other vascular implants and grafts: Secondary | ICD-10-CM

## 2024-08-09 DIAGNOSIS — Z7982 Long term (current) use of aspirin: Secondary | ICD-10-CM | POA: Insufficient documentation

## 2024-08-09 DIAGNOSIS — I10 Essential (primary) hypertension: Secondary | ICD-10-CM | POA: Diagnosis not present

## 2024-08-09 DIAGNOSIS — M8580 Other specified disorders of bone density and structure, unspecified site: Secondary | ICD-10-CM | POA: Diagnosis not present

## 2024-08-09 DIAGNOSIS — Z860101 Personal history of adenomatous and serrated colon polyps: Secondary | ICD-10-CM | POA: Insufficient documentation

## 2024-08-09 DIAGNOSIS — Z79624 Long term (current) use of inhibitors of nucleotide synthesis: Secondary | ICD-10-CM | POA: Diagnosis not present

## 2024-08-09 DIAGNOSIS — C9000 Multiple myeloma not having achieved remission: Secondary | ICD-10-CM

## 2024-08-09 DIAGNOSIS — Z79890 Hormone replacement therapy (postmenopausal): Secondary | ICD-10-CM | POA: Diagnosis not present

## 2024-08-09 DIAGNOSIS — C9002 Multiple myeloma in relapse: Secondary | ICD-10-CM | POA: Insufficient documentation

## 2024-08-09 DIAGNOSIS — R21 Rash and other nonspecific skin eruption: Secondary | ICD-10-CM | POA: Diagnosis not present

## 2024-08-09 DIAGNOSIS — Z7983 Long term (current) use of bisphosphonates: Secondary | ICD-10-CM | POA: Diagnosis not present

## 2024-08-09 MED ORDER — IMMUNE GLOBULIN (HUMAN) 10 GM/100ML IV SOLN
400.0000 mg/kg | Freq: Once | INTRAVENOUS | Status: AC
  Administered 2024-08-09: 20 g via INTRAVENOUS
  Filled 2024-08-09: qty 200

## 2024-08-09 MED ORDER — DEXTROSE 5 % IV SOLN: INTRAVENOUS | Status: DC

## 2024-08-09 MED ORDER — ACETAMINOPHEN 325 MG PO TABS
650.0000 mg | ORAL_TABLET | Freq: Once | ORAL | Status: AC
  Administered 2024-08-09: 650 mg via ORAL
  Filled 2024-08-09: qty 2

## 2024-08-09 MED ORDER — LORATADINE 10 MG PO TABS
10.0000 mg | ORAL_TABLET | Freq: Once | ORAL | Status: AC
  Administered 2024-08-09: 10 mg via ORAL
  Filled 2024-08-09: qty 1

## 2024-08-09 NOTE — Patient Instructions (Signed)
 Immune Globulin  Injection What is this medication? IMMUNE GLOBULIN  (im MUNE GLOB yoo lin) treats many immune system conditions. It works by Designer, multimedia extra antibodies. Antibodies are proteins made by the immune system that help protect the body. This medicine may be used for other purposes; ask your health care provider or pharmacist if you have questions. COMMON BRAND NAME(S): ASCENIV, Baygam, BIVIGAM, Carimune, Carimune NF, cutaquig, Cuvitru, Flebogamma, Flebogamma DIF, GamaSTAN, GamaSTAN S/D, Gamimune N, Gammagard, Gammagard S/D, Gammaked, Gammaplex, Gammar-P IV, Gamunex, Gamunex-C, Hizentra, Iveegam, Iveegam EN, Octagam, Panglobulin, Panglobulin NF, panzyga, Polygam S/D, Privigen , Sandoglobulin, Venoglobulin-S, Vigam, Vivaglobulin, Xembify What should I tell my care team before I take this medication? They need to know if you have any of these conditions: Blood clotting disorder Condition where you have excess fluid in your body, such as heart failure or edema Dehydration Diabetes Have had blood clots Heart disease Immune system conditions Kidney disease Low levels of IgA Recent or upcoming vaccine An unusual or allergic reaction to immune globulin , other medications, foods, dyes, or preservatives Pregnant or trying to get pregnant Breastfeeding How should I use this medication? This medication is infused into a vein or under the skin. It may also be injected into a muscle. It is usually given by your care team in a hospital or clinic setting. It may also be given at home. If you get this medication at home, you will be taught how to prepare and give it. Take it as directed on the prescription label. Keep taking it unless your care team tells you to stop. It is important that you put your used needles and syringes in a special sharps container. Do not put them in a trash can. If you do not have a sharps container, call your pharmacist or care team to get one. Talk to your care team  about the use of this medication in children. While it may be given to children for selected conditions, precautions do apply. Overdosage: If you think you have taken too much of this medicine contact a poison control center or emergency room at once. NOTE: This medicine is only for you. Do not share this medicine with others. What if I miss a dose? If you get this medication at the hospital or clinic: It is important not to miss your dose. Call your care team if you are unable to keep an appointment. If you give yourself this medication at home: If you miss a dose, take it as soon as you can. Then continue your normal schedule. If it is almost time for your next dose, take only that dose. Do not take double or extra doses. Call your care team with questions. What may interact with this medication? Live virus vaccines This list may not describe all possible interactions. Give your health care provider a list of all the medicines, herbs, non-prescription drugs, or dietary supplements you use. Also tell them if you smoke, drink alcohol , or use illegal drugs. Some items may interact with your medicine. What should I watch for while using this medication? Your condition will be monitored carefully while you are receiving this medication. Tell your care team if your symptoms do not start to get better or if they get worse. You may need blood work done while you are taking this medication. This medication increases the risk of blood clots. People with heart, blood vessel, or blood clotting conditions are more likely to develop a blood clot. Other risk factors include advanced age, estrogen use, tobacco  use, lack of movement, and being overweight. This medication can decrease the response to a vaccine. If you need to get vaccinated, tell your care team if you have received this medication within the last year. Extra booster doses may be needed. Talk to your care team to see if a different vaccination schedule  is needed. This medication is made from donated human blood. There is a small risk it may contain bacteria or viruses, such as hepatitis or HIV. All products are processed to kill most bacteria and viruses. Talk to your care team if you have questions about the risk of infection. If you have diabetes, talk to your care team about which device you should use to check your blood sugar. This medication may cause some devices to report falsely high blood sugar levels. This may cause you to react by not treating a low blood sugar level or by giving an insulin  dose that was not needed. This can cause severe low blood sugar levels. What side effects may I notice from receiving this medication? Side effects that you should report to your care team as soon as possible: Allergic reactions--skin rash, itching, hives, swelling of the face, lips, tongue, or throat Blood clot--pain, swelling, or warmth in the leg, shortness of breath, chest pain Fever, neck pain or stiffness, sensitivity to light, headache, nausea, vomiting, confusion, which may be signs of meningitis Hemolytic anemia--unusual weakness or fatigue, dizziness, headache, trouble breathing, dark urine, yellowing skin or eyes Kidney injury--decrease in the amount of urine, swelling of the ankles, hands, or feet Low sodium level--muscle weakness, fatigue, dizziness, headache, confusion Shortness of breath or trouble breathing, cough, unusual weakness or fatigue, blue skin or lips Side effects that usually do not require medical attention (report these to your care team if they continue or are bothersome): Chills Diarrhea Fever Headache Nausea This list may not describe all possible side effects. Call your doctor for medical advice about side effects. You may report side effects to FDA at 1-800-FDA-1088. Where should I keep my medication? Keep out of the reach of children and pets. You will be instructed on how to store this medication. Get rid of  any unused medication after the expiration date. To get rid of medications that are no longer needed or have expired: Take the medication to a medication take-back program. Check with your pharmacy or law enforcement to find a location. If you cannot return the medication, ask your pharmacist or care team how to get rid of this medication safely. NOTE: This sheet is a summary. It may not cover all possible information. If you have questions about this medicine, talk to your doctor, pharmacist, or health care provider.  2025 Elsevier/Gold Standard (2023-12-06 00:00:00)

## 2024-08-10 ENCOUNTER — Other Ambulatory Visit: Payer: Self-pay

## 2024-08-10 DIAGNOSIS — C9000 Multiple myeloma not having achieved remission: Secondary | ICD-10-CM

## 2024-08-14 ENCOUNTER — Encounter: Payer: Self-pay | Admitting: Hematology

## 2024-08-14 MED ORDER — FENTANYL 12 MCG/HR TD PT72: 1.0000 | MEDICATED_PATCH | TRANSDERMAL | 0 refills | Status: DC

## 2024-08-14 MED ORDER — GABAPENTIN 100 MG PO CAPS: 200.0000 mg | ORAL_CAPSULE | Freq: Every day | ORAL | 0 refills | Status: DC

## 2024-08-16 ENCOUNTER — Inpatient Hospital Stay: Admitting: Physician Assistant

## 2024-08-16 ENCOUNTER — Inpatient Hospital Stay

## 2024-08-16 ENCOUNTER — Other Ambulatory Visit: Payer: Self-pay

## 2024-08-16 ENCOUNTER — Encounter: Payer: Self-pay | Admitting: Hematology

## 2024-08-16 VITALS — BP 141/75 | HR 70 | Temp 98.2°F | Resp 18 | Ht 60.0 in | Wt 103.9 lb

## 2024-08-16 DIAGNOSIS — Z7189 Other specified counseling: Secondary | ICD-10-CM

## 2024-08-16 DIAGNOSIS — D801 Nonfamilial hypogammaglobulinemia: Secondary | ICD-10-CM

## 2024-08-16 DIAGNOSIS — R748 Abnormal levels of other serum enzymes: Secondary | ICD-10-CM | POA: Diagnosis not present

## 2024-08-16 DIAGNOSIS — C9002 Multiple myeloma in relapse: Secondary | ICD-10-CM | POA: Diagnosis not present

## 2024-08-16 DIAGNOSIS — Z5111 Encounter for antineoplastic chemotherapy: Secondary | ICD-10-CM | POA: Diagnosis not present

## 2024-08-16 LAB — CBC WITH DIFFERENTIAL (CANCER CENTER ONLY)
Abs Immature Granulocytes: 0.03 K/uL (ref 0.00–0.07)
Basophils Absolute: 0.1 K/uL (ref 0.0–0.1)
Basophils Relative: 2 %
Eosinophils Absolute: 0.2 K/uL (ref 0.0–0.5)
Eosinophils Relative: 4 %
HCT: 29.7 % — ABNORMAL LOW (ref 36.0–46.0)
Hemoglobin: 9.8 g/dL — ABNORMAL LOW (ref 12.0–15.0)
Immature Granulocytes: 1 %
Lymphocytes Relative: 11 %
Lymphs Abs: 0.4 K/uL — ABNORMAL LOW (ref 0.7–4.0)
MCH: 33.6 pg (ref 26.0–34.0)
MCHC: 33 g/dL (ref 30.0–36.0)
MCV: 101.7 fL — ABNORMAL HIGH (ref 80.0–100.0)
Monocytes Absolute: 0.6 K/uL (ref 0.1–1.0)
Monocytes Relative: 16 %
Neutro Abs: 2.3 K/uL (ref 1.7–7.7)
Neutrophils Relative %: 66 %
Platelet Count: 237 K/uL (ref 150–400)
RBC: 2.92 MIL/uL — ABNORMAL LOW (ref 3.87–5.11)
RDW: 16.2 % — ABNORMAL HIGH (ref 11.5–15.5)
WBC Count: 3.6 K/uL — ABNORMAL LOW (ref 4.0–10.5)
nRBC: 0 % (ref 0.0–0.2)

## 2024-08-16 LAB — CMP (CANCER CENTER ONLY)
ALT: 251 U/L — ABNORMAL HIGH (ref 0–44)
AST: 146 U/L — ABNORMAL HIGH (ref 15–41)
Albumin: 4.2 g/dL (ref 3.5–5.0)
Alkaline Phosphatase: 55 U/L (ref 38–126)
Anion gap: 5 (ref 5–15)
BUN: 17 mg/dL (ref 8–23)
CO2: 29 mmol/L (ref 22–32)
Calcium: 9.1 mg/dL (ref 8.9–10.3)
Chloride: 103 mmol/L (ref 98–111)
Creatinine: 0.65 mg/dL (ref 0.44–1.00)
GFR, Estimated: >60 mL/min (ref >60)
Glucose, Bld: 144 mg/dL — ABNORMAL HIGH (ref 70–99)
Potassium: 4.3 mmol/L (ref 3.5–5.1)
Sodium: 137 mmol/L (ref 135–145)
Total Bilirubin: 0.5 mg/dL (ref 0.0–1.2)
Total Protein: 6.2 g/dL — ABNORMAL LOW (ref 6.5–8.1)

## 2024-08-16 NOTE — Progress Notes (Signed)
 [facility]   [phone] [phone]   Clinic Follow up Note   Patient Care Team: Katrinka Garnette KIDD, MD as PCP - General (Family Medicine) Onesimo Emaline Brink, MD as Consulting Physician (Hematology) Bond, Reyes Mcardle, MD as Referring Physician (Ophthalmology) Evern Nian, Rogue, MD as Consulting Physician (Hematology and Oncology) Trixie File, MD as Consulting Physician (Internal Medicine) Eletha Boas, MD as Consulting Physician (General Surgery) Nicholaus Sherlean CROME, [facility] (Inactive) as Pharmacist (Pharmacist)  Date of Service: 08/17/24  CHIEF COMPLAINT:  Follow-up for continuation and management of multiple myeloma   SUMMARY OF ONCOLOGIC HISTORY: Oncology History  Multiple myeloma not having achieved remission (HCC)  12/12/2018 Initial Diagnosis   Multiple myeloma not having achieved remission (HCC)   12/20/2018 - 10/04/2019 Chemotherapy   The patient had dexamethasone  (DECADRON ) tablet 20 mg, 20 mg (100 % of original dose 20 mg), Oral, Once, 14 of 14 cycles Dose modification: 20 mg (original dose 20 mg, Cycle 1), 10 mg (original dose 20 mg, Cycle 14) Administration: 20 mg (12/20/2018), 20 mg (12/27/2018), 20 mg (01/10/2019), 20 mg (01/17/2019), 20 mg (01/31/2019), 20 mg (02/07/2019), 20 mg (03/14/2019), 20 mg (03/21/2019), 20 mg (02/21/2019), 20 mg (02/28/2019), 20 mg (04/04/2019), 20 mg (04/11/2019), 20 mg (04/25/2019), 20 mg (05/02/2019), 20 mg (05/16/2019), 20 mg (05/23/2019), 20 mg (06/06/2019), 20 mg (06/13/2019), 20 mg (06/27/2019), 20 mg (07/04/2019), 20 mg (07/18/2019), 20 mg (07/25/2019), 20 mg (08/08/2019), 20 mg (08/15/2019), 20 mg (08/29/2019), 20 mg (09/05/2019), 10 mg (09/19/2019) lenalidomide  (REVLIMID ) 15 MG capsule, 1 of 1 cycle, Start date: 01/18/2019, End date: 02/21/2019 bortezomib  SQ (VELCADE ) chemo injection 2 mg, 1.3 mg/m2 = 2 mg, Subcutaneous,  Once, 14 of 14 cycles Administration: 2 mg (12/20/2018), 2 mg (12/27/2018), 2 mg (12/23/2018), 2 mg  (12/30/2018), 2 mg (01/10/2019), 2 mg (01/17/2019), 2 mg (01/13/2019), 2 mg (01/20/2019), 2 mg (01/31/2019), 2 mg (02/07/2019), 2 mg (02/03/2019), 2 mg (02/10/2019), 2 mg (03/14/2019), 2 mg (03/17/2019), 2 mg (03/21/2019), 2 mg (03/24/2019), 2 mg (02/21/2019), 2 mg (02/24/2019), 2 mg (02/28/2019), 2 mg (03/03/2019), 2 mg (04/04/2019), 2 mg (04/06/2019), 2 mg (04/11/2019), 2 mg (04/14/2019), 2 mg (04/25/2019), 2 mg (04/28/2019), 2 mg (05/02/2019), 2 mg (05/05/2019), 2 mg (05/16/2019), 2 mg (05/19/2019), 2 mg (05/23/2019), 2 mg (05/26/2019), 2 mg (06/06/2019), 2 mg (06/09/2019), 2 mg (06/13/2019), 2 mg (06/16/2019), 2 mg (06/27/2019), 2 mg (06/30/2019), 2 mg (07/04/2019), 2 mg (07/07/2019), 2 mg (07/18/2019), 2 mg (07/21/2019), 2 mg (07/25/2019), 2 mg (07/28/2019), 2 mg (08/08/2019), 2 mg (08/11/2019), 2 mg (08/15/2019), 2 mg (08/18/2019), 2 mg (08/29/2019), 2 mg (09/01/2019), 2 mg (09/05/2019), 2 mg (09/08/2019), 2 mg (09/19/2019), 2 mg (10/04/2019)  for chemotherapy treatment.    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   INTERVAL HISTORY:  Megan Olson is a 83 y.o. female returns for continued  evaluation and management of multiple myeloma. He is here prior to her next teclistamab  therapy. She is unaccompanied for this visit.   Ms. [name] reports she has some discomfort in her left side of her flank when she takes a deep breath. She did have some  blurry vision in her left eye for two days after IVIG infusion. She reports her energy and appetite are at her baseline. She denies nausea, vomiting or bowel habit changes. She denies easy bruising or signs of bleeding. She denies fevers, chills, sweats, shortness of breath, chest pain or cough. She has no other complaints. Rest of the ROS is below.     MEDICAL HISTORY:  Past Medical History:  Diagnosis Date   Anemia    Blood transfusion without reported diagnosis Yrs ago when going through menopause   Cataract    Glaucoma    HYPERTENSION 03/11/2007   Multiple myeloma (HCC)    OSTEOPENIA 03/11/2007   Pre-diabetes    Thyroid  cancer (HCC)    Thyroid  disease    Tubular adenoma of colon 07/2015    SURGICAL HISTORY: Past Surgical History:  Procedure Laterality Date   BONE MARROW BIOPSY     multiple   BREAST EXCISIONAL BIOPSY Right 2000   BREAST LUMPECTOMY  1990   benign   CATARACT EXTRACTION Bilateral 2018   DILATION AND CURETTAGE OF UTERUS     bleeding at menopause. No uterine cancer   EYE SURGERY  2017, 2018. 2019   IR IMAGING GUIDED PORT INSERTION  05/16/2021   IR RADIOLOGIST EVAL & MGMT  12/13/2018   THYROIDECTOMY N/A 08/02/2020   Procedure: TOTAL THYROIDECTOMY;  Surgeon: Eletha Boas, MD;  Location: [facility];  Service: General;  Laterality: N/A;   TONSILLECTOMY     age 82   I have reviewed the social history and family history with the patient and they are unchanged from previous note.  ALLERGIES:  is allergic to ace inhibitors, benadryl [diphenhydramine], diamox [acetazolamide], sulfamethoxazole, lenalidomide , and penicillins.  MEDICATIONS:  Current Outpatient Medications  Medication Sig Dispense Refill   acyclovir  (ZOVIRAX ) 400 MG tablet Take 1 tablet (400 mg total) by mouth 2 (two) times daily. 60 tablet 3   ALPHAGAN  P 0.1 % SOLN Place 1 drop into both eyes in the morning, at noon, and at bedtime.      amLODipine  (NORVASC ) 10 MG tablet Take 1 tablet (10 mg total) by  mouth daily. 90 tablet 3   aspirin EC 81 MG tablet Take 81 mg by mouth daily.     b complex vitamins capsule Take 1 capsule by mouth daily.     Calcium  Carb-Cholecalciferol (CALCIUM  600 + D PO) Take by mouth.     Cholecalciferol (VITAMIN D3) 125 MCG (5000 UT) TABS Take 1,000 Units by mouth daily.     Co-Enzyme Q-10 100 MG CAPS Take 100 mg by mouth daily.     cyanocobalamin  (VITAMIN B12) 500 MCG tablet Take 500 mcg by mouth every other day.     dapsone  100 MG tablet Take 1 tablet (100 mg total) by mouth daily. 30 tablet 3   dorzolamide -timolol  (COSOPT ) 22.3-6.8 MG/ML ophthalmic solution Place 1 drop into both eyes 2 (two) times daily.   11   fentaNYL  (DURAGESIC ) 12 MCG/HR Place 1 patch onto the skin every 3 (three) days. 10 patch 0   gabapentin  (NEURONTIN ) 100 MG capsule Take 2 capsules (200 mg total) by mouth at bedtime. 60 capsule 0   levothyroxine  (SYNTHROID ) 88 MCG tablet Take 1 tablet (  88 mcg total) by mouth daily before breakfast. 90 tablet 3   lidocaine -prilocaine  (EMLA ) cream Apply to affected area once 30 g 3   magnesium  chloride (SLOW-MAG) 64 MG TBEC SR tablet Take by mouth.     Multiple Vitamins-Minerals (MULTIVITAMIN WITH MINERALS) tablet Take 1 tablet by mouth daily. Centrum Silver 50 +     Netarsudil-Latanoprost  (ROCKLATAN) 0.02-0.005 % SOLN Place 1 drop into both eyes daily.     Omega-3 1000 MG CAPS Take 1,000 mg by mouth daily.      ondansetron  (ZOFRAN ) 8 MG tablet Take 1 tablet (8 mg total) by mouth every 8 (eight) hours as needed for nausea or vomiting. 30 tablet 1   oxyCODONE -acetaminophen  (PERCOCET) 5-325 MG tablet Take 1-2 tablets by mouth every 4 (four) hours as needed for moderate pain or severe pain. 90 tablet 0   polyethylene glycol (MIRALAX ) packet Take 17 g by mouth daily. 30 each 1   prochlorperazine  (COMPAZINE ) 10 MG tablet Take 1 tablet (10 mg total) by mouth every 6 (six) hours as needed for nausea or vomiting. 30 tablet 1   senna-docusate (EQ STOOL  SOFTENER/LAXATIVE) 8.6-50 MG tablet Take 1 tablet by mouth at bedtime. 60 tablet 2   Simethicone  (GAS-X PO) Take 1 tablet by mouth as needed.     teclistamab -cqyv SQ (TECVAYLI ) 153 MG/1.7ML Inject 1.5 mg/kg into the skin once.     vitamin C (ASCORBIC ACID) 500 MG tablet Take 500 mg by mouth 3 (three) times a week.     azithromycin  (ZITHROMAX ) 250 MG tablet Take 2 tablets on day 1 and then 1 tablet on on days 2-5. (Patient not taking: Reported on 07/19/2024) 6 each 0   filgrastim -sndz (ZARXIO ) 300 MCG/0.5ML SOSY injection Inject 0.5 mLs (300 mcg total) into the skin 2 (two) times a week. On Monday and thursday (Patient not taking: Reported on 08/16/2024) 4 mL 2   No current facility-administered medications for this visit.    PHYSICAL EXAMINATION:. BP (!) 141/75 (BP Location: Left Arm, Patient Position: Sitting) Comment: nurse notified  Pulse 70   Temp 98.2 F (36.8 C) (Temporal)   Resp 18   Ht 5' (1.524 m)   Wt 103 lb 14.4 oz (47.1 kg)   SpO2 93%   BMI 20.29 kg/m    GENERAL:alert, in no acute distress and comfortable SKIN: no acute rashes, no significant lesions EYES: conjunctiva are pink and non-injected, sclera anicteric OROPHARYNX: MMM, no exudates, no oropharyngeal erythema or ulceration LUNGS: normal respiratory effort, clear to auscultation HEART: regular rate & rhythm Extremity: no pedal edema PSYCH: alert & oriented x 3 with fluent speech NEURO: no focal motor/sensory deficits    LABORATORY DATA:     Latest Ref Rng & Units 08/16/2024   12:16 PM 08/02/2024   12:04 PM 07/19/2024    8:48 AM  CBC  WBC 4.0 - 10.5 K/uL 3.6  3.8  5.3   Hemoglobin 12.0 - 15.0 g/dL 9.8  9.2  9.0   Hematocrit 36.0 - 46.0 % 29.7  27.1  27.7   Platelets 150 - 400 K/uL 237  277  347       Latest Ref Rng & Units 08/16/2024   12:16 PM 08/02/2024   12:04 PM 07/19/2024    8:48 AM  CMP  Glucose 70 - 99 mg/dL 855  873  894   BUN 8 - 23 mg/dL 17  16  13    Creatinine 0.44 - 1.00 mg/dL 9.34   9.28  9.39  Sodium 135 - 145 mmol/L 137  137  138   Potassium 3.5 - 5.1 mmol/L 4.3  4.2  4.5   Chloride 98 - 111 mmol/L 103  104  105   CO2 22 - 32 mmol/L 29  28  29    Calcium  8.9 - 10.3 mg/dL 9.1  9.0  9.3   Total Protein 6.5 - 8.1 g/dL 6.2  5.8  5.7   Total Bilirubin 0.0 - 1.2 mg/dL 0.5  0.5  0.6   Alkaline Phos 38 - 126 U/L 55  53  60   AST 15 - 41 U/L 146  42  42   ALT 0 - 44 U/L 251  88  64     RADIOGRAPHIC STUDIES: I have personally reviewed the radiological images as listed and agreed with the findings in the report. No results found.   ASSESSMENT & PLAN:   DESMA WILKOWSKI is a 83 y.o. female who presents for a follow up for multiple myeloma.   1. 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 -Transitioned to PO Cytoxan /Velcade /Dex of each 21-day cycle in May 2020 -Transitioned to maintenance ixazomib therapy in December 2020. -Due to progression of disease in July 2022, started Kyprolis /Dex therapy -Due to progression of disease in August 2023, switched to Dara/Pom/Dex -Due to progression of disease in September 2024, switched to elotuzumab /velcade /dex -Due to progression of disease in October 2024, switched to teclistamab  at [facility]. Tolerated step up therapy without CRS or ICANS.  -Cycle 8, Day 1 of Teclistamab  was delayed by one week 2/2 neutropenia, started on Zarxio  M/W/F of off weeks of therapy.  -Transitioned to Zarxio  Mon/Thurs weekly starting Cycle 9 of Teclistamab .  -Discontinued Zarxio  starting 05/24/2024.   2.  Cancer related pain  -Currently well controlled -Continue fentanyl  patch at 12 mcg/h and Percocet for break through pain.    PLAN: -Currently on  maintenance Tecvayli  every 2 weeks -Labs from today were reviewed with patient. WBC 3.6, Hgb 9.8, Plt 237, creatinine 0.65. increased LFTs with AST 146, ALT 251.  -Due to rise in LFTs, we will hold treatment today.  -Continue to hold Zarxio  -Started IVIG therapy q 4 weeks on 08/09/2024, which could have contributed to her elevated LFTs.  -Continue dapsone  for PJP prophylaxis -Continue acyclovir  -Continue Zometa  every 12 weeks  FOLLOW-UP: -Labs next week to trend liver function -Liver US  to rule out acute abnormalities.  -Continue Teclistamab  as per orders every 2 weeks with labs and visit.    All of the patient's questions were answered with apparent satisfaction. The patient knows to call the clinic with any problems, questions or concerns.   I have spent a total of 30 minutes minutes of face-to-face and non-face-to-face time, preparing to see the patient,  performing a medically appropriate examination, counseling and educating the patient,  documenting clinical information in the electronic health record, independently interpreting results and communicating results to the patient, and care coordination.    Johnston Police PA-C Dept of Hematology and Oncology [facility] Phone: [phone]

## 2024-08-17 ENCOUNTER — Encounter: Payer: Self-pay | Admitting: Hematology

## 2024-08-17 LAB — KAPPA/LAMBDA LIGHT CHAINS
Kappa free light chain: <0.7 mg/L — ABNORMAL LOW (ref 3.3–19.4)
Kappa, lambda light chain ratio: UNDETERMINED
Lambda free light chains: <1.5 mg/L — ABNORMAL LOW (ref 5.7–26.3)

## 2024-08-18 ENCOUNTER — Ambulatory Visit (HOSPITAL_BASED_OUTPATIENT_CLINIC_OR_DEPARTMENT_OTHER): Admission: RE | Admit: 2024-08-18 | Source: Ambulatory Visit

## 2024-08-20 ENCOUNTER — Encounter: Payer: Self-pay | Admitting: Hematology

## 2024-08-21 LAB — MULTIPLE MYELOMA PANEL, SERUM
Albumin SerPl Elph-Mcnc: 3.9 g/dL (ref 2.9–4.4)
Albumin/Glob SerPl: 2 — ABNORMAL HIGH (ref 0.7–1.7)
Alpha 1: 0.3 g/dL (ref 0.0–0.4)
Alpha2 Glob SerPl Elph-Mcnc: 0.5 g/dL (ref 0.4–1.0)
B-Globulin SerPl Elph-Mcnc: 0.8 g/dL (ref 0.7–1.3)
Gamma Glob SerPl Elph-Mcnc: 0.4 g/dL (ref 0.4–1.8)
Globulin, Total: 2 g/dL — ABNORMAL LOW (ref 2.2–3.9)
IgA: <5 mg/dL — ABNORMAL LOW (ref 64–422)
IgG (Immunoglobin G), Serum: 527 mg/dL — ABNORMAL LOW (ref 586–1602)
IgM (Immunoglobulin M), Srm: <5 mg/dL — ABNORMAL LOW (ref 26–217)
Total Protein ELP: 5.9 g/dL — ABNORMAL LOW (ref 6.0–8.5)

## 2024-08-22 ENCOUNTER — Ambulatory Visit (HOSPITAL_COMMUNITY)
Admission: RE | Admit: 2024-08-22 | Discharge: 2024-08-22 | Disposition: A | Discharge status: Home / Self Care | Source: Ambulatory Visit | Attending: Physician Assistant | Admitting: Physician Assistant

## 2024-08-22 ENCOUNTER — Other Ambulatory Visit: Payer: Self-pay | Admitting: Physician Assistant

## 2024-08-22 ENCOUNTER — Encounter: Payer: Self-pay | Admitting: Hematology

## 2024-08-22 DIAGNOSIS — R748 Abnormal levels of other serum enzymes: Secondary | ICD-10-CM | POA: Diagnosis not present

## 2024-08-22 DIAGNOSIS — D801 Nonfamilial hypogammaglobulinemia: Secondary | ICD-10-CM

## 2024-08-22 DIAGNOSIS — C9002 Multiple myeloma in relapse: Secondary | ICD-10-CM

## 2024-08-23 ENCOUNTER — Telehealth: Payer: Self-pay

## 2024-08-23 ENCOUNTER — Other Ambulatory Visit: Payer: Self-pay | Admitting: *Deleted

## 2024-08-23 ENCOUNTER — Inpatient Hospital Stay

## 2024-08-23 ENCOUNTER — Other Ambulatory Visit: Payer: Self-pay | Admitting: Physician Assistant

## 2024-08-23 DIAGNOSIS — Z5111 Encounter for antineoplastic chemotherapy: Secondary | ICD-10-CM | POA: Diagnosis not present

## 2024-08-23 DIAGNOSIS — R748 Abnormal levels of other serum enzymes: Secondary | ICD-10-CM

## 2024-08-23 LAB — CMP (CANCER CENTER ONLY)
ALT: 268 U/L — ABNORMAL HIGH (ref 0–44)
AST: 124 U/L — ABNORMAL HIGH (ref 15–41)
Albumin: 4.3 g/dL (ref 3.5–5.0)
Alkaline Phosphatase: 58 U/L (ref 38–126)
Anion gap: 8 (ref 5–15)
BUN: 15 mg/dL (ref 8–23)
CO2: 27 mmol/L (ref 22–32)
Calcium: 9.1 mg/dL (ref 8.9–10.3)
Chloride: 102 mmol/L (ref 98–111)
Creatinine: 0.66 mg/dL (ref 0.44–1.00)
GFR, Estimated: >60 mL/min (ref >60)
Glucose, Bld: 103 mg/dL — ABNORMAL HIGH (ref 70–99)
Potassium: 4.4 mmol/L (ref 3.5–5.1)
Sodium: 137 mmol/L (ref 135–145)
Total Bilirubin: 0.5 mg/dL (ref 0.0–1.2)
Total Protein: 5.9 g/dL — ABNORMAL LOW (ref 6.5–8.1)

## 2024-08-23 LAB — CBC WITH DIFFERENTIAL (CANCER CENTER ONLY)
Abs Immature Granulocytes: 0.02 K/uL (ref 0.00–0.07)
Basophils Absolute: 0.1 K/uL (ref 0.0–0.1)
Basophils Relative: 3 %
Eosinophils Absolute: 0.1 K/uL (ref 0.0–0.5)
Eosinophils Relative: 3 %
HCT: 28.5 % — ABNORMAL LOW (ref 36.0–46.0)
Hemoglobin: 9.4 g/dL — ABNORMAL LOW (ref 12.0–15.0)
Immature Granulocytes: 1 %
Lymphocytes Relative: 9 %
Lymphs Abs: 0.3 K/uL — ABNORMAL LOW (ref 0.7–4.0)
MCH: 33.8 pg (ref 26.0–34.0)
MCHC: 33 g/dL (ref 30.0–36.0)
MCV: 102.5 fL — ABNORMAL HIGH (ref 80.0–100.0)
Monocytes Absolute: 0.7 K/uL (ref 0.1–1.0)
Monocytes Relative: 19 %
Neutro Abs: 2.2 K/uL (ref 1.7–7.7)
Neutrophils Relative %: 65 %
Platelet Count: 218 K/uL (ref 150–400)
RBC: 2.78 MIL/uL — ABNORMAL LOW (ref 3.87–5.11)
RDW: 17.2 % — ABNORMAL HIGH (ref 11.5–15.5)
WBC Count: 3.4 K/uL — ABNORMAL LOW (ref 4.0–10.5)
nRBC: 0 % (ref 0.0–0.2)

## 2024-08-23 LAB — HEPATITIS C ANTIBODY: HCV Ab: NONREACTIVE

## 2024-08-23 LAB — HEPATITIS B SURFACE ANTIGEN: Hepatitis B Surface Ag: NONREACTIVE

## 2024-08-23 LAB — C-REACTIVE PROTEIN: CRP: 0.5 mg/dL (ref <1.0)

## 2024-08-23 LAB — SEDIMENTATION RATE: Sed Rate: 7 mm/h (ref 0–22)

## 2024-08-23 LAB — HEPATITIS B SURFACE ANTIBODY,QUALITATIVE: Hep B S Ab: REACTIVE — AB

## 2024-08-23 MED ORDER — PREDNISONE 10 MG PO TABS: ORAL_TABLET | ORAL | 0 refills | Status: AC

## 2024-08-23 NOTE — Telephone Encounter (Signed)
 Due to persistent elevation of LFTs, will start prednisone taper starting at 50 mg daily. Will recheck labs next week to determine if we can taper the prednisone.   LM for pt to return my call to advise of lab results and recommendations

## 2024-08-23 NOTE — Telephone Encounter (Signed)
 Spoke with pt with instructions to start prednisone at 50mg  PO daily until she hears from Dr Onesimo after next weeks lab work.  Pt with Vu and agreed to this plan with no further questions

## 2024-08-23 NOTE — Progress Notes (Signed)
 Due to persistent elevation of LFTs, will start prednisone taper starting at 50 mg daily. Will recheck labs next week to determine if we can taper the prednisone.

## 2024-08-24 LAB — CYTOMEGALOVIRUS DNA, QUANTITATIVE REAL-TIME PCR, PLASMA
CMV DNA Quant: NEGATIVE [IU]/mL
Log10 CMV Qn DNA Pl: UNDETERMINED {Log_IU}/mL

## 2024-08-24 LAB — HEPATITIS B CORE ANTIBODY, TOTAL: HEP B CORE AB: NEGATIVE

## 2024-08-25 LAB — ANTINUCLEAR ANTIBODIES, IFA: ANA Ab, IFA: NEGATIVE

## 2024-08-30 ENCOUNTER — Inpatient Hospital Stay

## 2024-08-30 ENCOUNTER — Encounter: Payer: Self-pay | Admitting: Hematology

## 2024-08-30 ENCOUNTER — Other Ambulatory Visit: Payer: Self-pay | Admitting: Hematology

## 2024-08-30 VITALS — BP 153/70 | HR 65 | Temp 98.7°F | Resp 16 | Wt 103.8 lb

## 2024-08-30 DIAGNOSIS — R748 Abnormal levels of other serum enzymes: Secondary | ICD-10-CM

## 2024-08-30 DIAGNOSIS — Z7189 Other specified counseling: Secondary | ICD-10-CM

## 2024-08-30 DIAGNOSIS — Z5111 Encounter for antineoplastic chemotherapy: Secondary | ICD-10-CM | POA: Diagnosis not present

## 2024-08-30 DIAGNOSIS — C9002 Multiple myeloma in relapse: Secondary | ICD-10-CM

## 2024-08-30 LAB — CMP (CANCER CENTER ONLY)
ALT: 148 U/L — ABNORMAL HIGH (ref 0–44)
AST: 55 U/L — ABNORMAL HIGH (ref 15–41)
Albumin: 4.5 g/dL (ref 3.5–5.0)
Alkaline Phosphatase: 53 U/L (ref 38–126)
Anion gap: 8 (ref 5–15)
BUN: 23 mg/dL (ref 8–23)
CO2: 26 mmol/L (ref 22–32)
Calcium: 9.2 mg/dL (ref 8.9–10.3)
Chloride: 104 mmol/L (ref 98–111)
Creatinine: 0.75 mg/dL (ref 0.44–1.00)
GFR, Estimated: >60 mL/min (ref >60)
Glucose, Bld: 116 mg/dL — ABNORMAL HIGH (ref 70–99)
Potassium: 4.6 mmol/L (ref 3.5–5.1)
Sodium: 138 mmol/L (ref 135–145)
Total Bilirubin: 0.6 mg/dL (ref 0.0–1.2)
Total Protein: 6.1 g/dL — ABNORMAL LOW (ref 6.5–8.1)

## 2024-08-30 LAB — CBC WITH DIFFERENTIAL (CANCER CENTER ONLY)
Abs Immature Granulocytes: 0.15 K/uL — ABNORMAL HIGH (ref 0.00–0.07)
Basophils Absolute: 0.1 K/uL (ref 0.0–0.1)
Basophils Relative: 1 %
Eosinophils Absolute: 0 K/uL (ref 0.0–0.5)
Eosinophils Relative: 0 %
HCT: 29.8 % — ABNORMAL LOW (ref 36.0–46.0)
Hemoglobin: 9.9 g/dL — ABNORMAL LOW (ref 12.0–15.0)
Immature Granulocytes: 2 %
Lymphocytes Relative: 3 %
Lymphs Abs: 0.3 K/uL — ABNORMAL LOW (ref 0.7–4.0)
MCH: 34.3 pg — ABNORMAL HIGH (ref 26.0–34.0)
MCHC: 33.2 g/dL (ref 30.0–36.0)
MCV: 103.1 fL — ABNORMAL HIGH (ref 80.0–100.0)
Monocytes Absolute: 0.4 K/uL (ref 0.1–1.0)
Monocytes Relative: 4 %
Neutro Abs: 9.1 K/uL — ABNORMAL HIGH (ref 1.7–7.7)
Neutrophils Relative %: 90 %
Platelet Count: 243 K/uL (ref 150–400)
RBC: 2.89 MIL/uL — ABNORMAL LOW (ref 3.87–5.11)
RDW: 18.1 % — ABNORMAL HIGH (ref 11.5–15.5)
WBC Count: 9.9 K/uL (ref 4.0–10.5)
nRBC: 0 % (ref 0.0–0.2)

## 2024-08-30 MED ORDER — TECLISTAMAB-CQYV CHEMO 153 MG/1.7ML SQ SOLN
1.5000 mg/kg | Freq: Once | SUBCUTANEOUS | Status: AC
  Administered 2024-08-30: 70 mg via SUBCUTANEOUS
  Filled 2024-08-30: qty 0.78

## 2024-08-30 MED ORDER — SODIUM CHLORIDE 0.9 % IV SOLN: Freq: Once | INTRAVENOUS | Status: AC

## 2024-08-30 MED ORDER — METHYLPREDNISOLONE SODIUM SUCC 125 MG IJ SOLR
80.0000 mg | Freq: Once | INTRAMUSCULAR | Status: AC
  Administered 2024-08-30: 80 mg via INTRAVENOUS
  Filled 2024-08-30: qty 2

## 2024-08-30 NOTE — Patient Instructions (Signed)
 [facility] A DEPT OF [place]  Discharge Instructions: Thank you for choosing [facility] to provide your oncology and hematology care.   If you have a lab appointment with the Cancer Center, please go directly to the Cancer Center and check in at the registration area.   Wear comfortable clothing and clothing appropriate for easy access to any Portacath or PICC line.   We strive to give you quality time with your provider. You may need to reschedule your appointment if you arrive late (15 or more minutes).  Arriving late affects you and other patients whose appointments are after yours.  Also, if you miss three or more appointments without notifying the office, you may be dismissed from the clinic at the provider's discretion.      For prescription refill requests, have your pharmacy contact our office and allow 72 hours for refills to be completed.    Today you received the following chemotherapy and/or immunotherapy agents tecvayli       To help prevent nausea and vomiting after your treatment, we encourage you to take your nausea medication as directed.  BELOW ARE SYMPTOMS THAT SHOULD BE REPORTED IMMEDIATELY: *FEVER GREATER THAN 100.4 F (38 C) OR HIGHER *CHILLS OR SWEATING *NAUSEA AND VOMITING THAT IS NOT CONTROLLED WITH YOUR NAUSEA MEDICATION *UNUSUAL SHORTNESS OF BREATH *UNUSUAL BRUISING OR BLEEDING *URINARY PROBLEMS (pain or burning when urinating, or frequent urination) *BOWEL PROBLEMS (unusual diarrhea, constipation, pain near the anus) TENDERNESS IN MOUTH AND THROAT WITH OR WITHOUT PRESENCE OF ULCERS (sore throat, sores in mouth, or a toothache) UNUSUAL RASH, SWELLING OR PAIN  UNUSUAL VAGINAL DISCHARGE OR ITCHING   Items with * indicate a potential emergency and should be followed up as soon as possible or go to the Emergency Department if any problems should occur.  Please show the CHEMOTHERAPY ALERT CARD or IMMUNOTHERAPY  ALERT CARD at check-in to the Emergency Department and triage nurse.  Should you have questions after your visit or need to cancel or reschedule your appointment, please contact [facility] DE[facility]  Dept: [phone]  and follow the prompts.  Office hours are 8:00 a.m. to 4:30 p.m. Monday - Friday. Please note that voicemails left after 4:00 p.m. may not be returned until the following business day.  We are closed weekends and major holidays. You have access to a nurse at all times for urgent questions. Please call the main number to the clinic Dept: [phone] and follow the prompts.   For any non-urgent questions, you may also contact your provider using MyChart. We now offer e-Visits for anyone 71 and older to request care online for non-urgent symptoms. For details visit [email or web address].   Also download the MyChart app! Go to the app store, search MyChart, open the app, select [place], and log in with your MyChart username and password.

## 2024-08-30 NOTE — Progress Notes (Signed)
 Today is day 1 of next cycle (#16) per Dr. Onesimo. Treatment plan updated accordingly.  Harlene Nasuti, PharmD Oncology Infusion Pharmacist 08/30/2024 10:37 AM

## 2024-09-01 LAB — EPSTEIN BARR VRS(EBV DNA BY PCR): EBV DNA QN by PCR: NEGATIVE [IU]/mL

## 2024-09-04 DIAGNOSIS — H401112 Primary open-angle glaucoma, right eye, moderate stage: Secondary | ICD-10-CM | POA: Diagnosis not present

## 2024-09-04 DIAGNOSIS — H401122 Primary open-angle glaucoma, left eye, moderate stage: Secondary | ICD-10-CM | POA: Diagnosis not present

## 2024-09-11 ENCOUNTER — Other Ambulatory Visit: Payer: Self-pay

## 2024-09-11 DIAGNOSIS — C9000 Multiple myeloma not having achieved remission: Secondary | ICD-10-CM

## 2024-09-13 ENCOUNTER — Inpatient Hospital Stay

## 2024-09-13 ENCOUNTER — Encounter: Payer: Self-pay | Admitting: Hematology

## 2024-09-13 ENCOUNTER — Inpatient Hospital Stay: Admitting: Hematology

## 2024-09-13 ENCOUNTER — Inpatient Hospital Stay: Attending: Hematology

## 2024-09-13 VITALS — BP 148/75 | HR 73 | Temp 97.9°F | Resp 18 | Wt 103.0 lb

## 2024-09-13 DIAGNOSIS — D801 Nonfamilial hypogammaglobulinemia: Secondary | ICD-10-CM | POA: Diagnosis not present

## 2024-09-13 DIAGNOSIS — C9001 Multiple myeloma in remission: Secondary | ICD-10-CM | POA: Diagnosis not present

## 2024-09-13 DIAGNOSIS — Z85850 Personal history of malignant neoplasm of thyroid: Secondary | ICD-10-CM | POA: Diagnosis not present

## 2024-09-13 DIAGNOSIS — Z9221 Personal history of antineoplastic chemotherapy: Secondary | ICD-10-CM | POA: Diagnosis not present

## 2024-09-13 DIAGNOSIS — Z7982 Long term (current) use of aspirin: Secondary | ICD-10-CM | POA: Diagnosis not present

## 2024-09-13 DIAGNOSIS — Z79624 Long term (current) use of inhibitors of nucleotide synthesis: Secondary | ICD-10-CM | POA: Diagnosis not present

## 2024-09-13 DIAGNOSIS — Z7983 Long term (current) use of bisphosphonates: Secondary | ICD-10-CM | POA: Insufficient documentation

## 2024-09-13 DIAGNOSIS — G893 Neoplasm related pain (acute) (chronic): Secondary | ICD-10-CM | POA: Diagnosis not present

## 2024-09-13 DIAGNOSIS — Z5111 Encounter for antineoplastic chemotherapy: Secondary | ICD-10-CM

## 2024-09-13 DIAGNOSIS — M8580 Other specified disorders of bone density and structure, unspecified site: Secondary | ICD-10-CM | POA: Diagnosis not present

## 2024-09-13 DIAGNOSIS — Z87891 Personal history of nicotine dependence: Secondary | ICD-10-CM | POA: Diagnosis not present

## 2024-09-13 DIAGNOSIS — I10 Essential (primary) hypertension: Secondary | ICD-10-CM | POA: Insufficient documentation

## 2024-09-13 DIAGNOSIS — Z801 Family history of malignant neoplasm of trachea, bronchus and lung: Secondary | ICD-10-CM | POA: Insufficient documentation

## 2024-09-13 DIAGNOSIS — Z79890 Hormone replacement therapy (postmenopausal): Secondary | ICD-10-CM | POA: Insufficient documentation

## 2024-09-13 DIAGNOSIS — Z7189 Other specified counseling: Secondary | ICD-10-CM

## 2024-09-13 DIAGNOSIS — K7689 Other specified diseases of liver: Secondary | ICD-10-CM | POA: Insufficient documentation

## 2024-09-13 DIAGNOSIS — C9002 Multiple myeloma in relapse: Secondary | ICD-10-CM

## 2024-09-13 DIAGNOSIS — Z79899 Other long term (current) drug therapy: Secondary | ICD-10-CM | POA: Diagnosis not present

## 2024-09-13 DIAGNOSIS — Z7952 Long term (current) use of systemic steroids: Secondary | ICD-10-CM | POA: Diagnosis not present

## 2024-09-13 DIAGNOSIS — Z860101 Personal history of adenomatous and serrated colon polyps: Secondary | ICD-10-CM | POA: Insufficient documentation

## 2024-09-13 DIAGNOSIS — R5383 Other fatigue: Secondary | ICD-10-CM | POA: Diagnosis not present

## 2024-09-13 LAB — CBC WITH DIFFERENTIAL (CANCER CENTER ONLY)
Abs Immature Granulocytes: 0.1 K/uL — ABNORMAL HIGH (ref 0.00–0.07)
Basophils Absolute: 0 K/uL (ref 0.0–0.1)
Basophils Relative: 0 %
Eosinophils Absolute: 0 K/uL (ref 0.0–0.5)
Eosinophils Relative: 0 %
HCT: 31.3 % — ABNORMAL LOW (ref 36.0–46.0)
Hemoglobin: 10.5 g/dL — ABNORMAL LOW (ref 12.0–15.0)
Immature Granulocytes: 2 %
Lymphocytes Relative: 3 %
Lymphs Abs: 0.2 K/uL — ABNORMAL LOW (ref 0.7–4.0)
MCH: 36.3 pg — ABNORMAL HIGH (ref 26.0–34.0)
MCHC: 33.5 g/dL (ref 30.0–36.0)
MCV: 108.3 fL — ABNORMAL HIGH (ref 80.0–100.0)
Monocytes Absolute: 0.1 K/uL (ref 0.1–1.0)
Monocytes Relative: 2 %
Neutro Abs: 5.1 K/uL (ref 1.7–7.7)
Neutrophils Relative %: 93 %
Platelet Count: 165 K/uL (ref 150–400)
RBC: 2.89 MIL/uL — ABNORMAL LOW (ref 3.87–5.11)
RDW: 19.1 % — ABNORMAL HIGH (ref 11.5–15.5)
WBC Count: 5.5 K/uL (ref 4.0–10.5)
nRBC: 0 % (ref 0.0–0.2)

## 2024-09-13 LAB — CMP (CANCER CENTER ONLY)
ALT: 117 U/L — ABNORMAL HIGH (ref 0–44)
AST: 46 U/L — ABNORMAL HIGH (ref 15–41)
Albumin: 4.5 g/dL (ref 3.5–5.0)
Alkaline Phosphatase: 40 U/L (ref 38–126)
Anion gap: 9 (ref 5–15)
BUN: 18 mg/dL (ref 8–23)
CO2: 26 mmol/L (ref 22–32)
Calcium: 8.8 mg/dL — ABNORMAL LOW (ref 8.9–10.3)
Chloride: 106 mmol/L (ref 98–111)
Creatinine: 0.7 mg/dL (ref 0.44–1.00)
GFR, Estimated: >60 mL/min (ref >60)
Glucose, Bld: 149 mg/dL — ABNORMAL HIGH (ref 70–99)
Potassium: 4.2 mmol/L (ref 3.5–5.1)
Sodium: 140 mmol/L (ref 135–145)
Total Bilirubin: 0.8 mg/dL (ref 0.0–1.2)
Total Protein: 6 g/dL — ABNORMAL LOW (ref 6.5–8.1)

## 2024-09-13 MED ORDER — TECLISTAMAB-CQYV CHEMO 153 MG/1.7ML SQ SOLN
1.5000 mg/kg | Freq: Once | SUBCUTANEOUS | Status: AC
  Administered 2024-09-13: 70 mg via SUBCUTANEOUS
  Filled 2024-09-13: qty 0.78

## 2024-09-13 MED ORDER — METHYLPREDNISOLONE SODIUM SUCC 125 MG IJ SOLR
80.0000 mg | Freq: Once | INTRAMUSCULAR | Status: AC
  Administered 2024-09-13: 80 mg via INTRAVENOUS
  Filled 2024-09-13: qty 2

## 2024-09-13 MED ORDER — GABAPENTIN 100 MG PO CAPS: 200.0000 mg | ORAL_CAPSULE | Freq: Every day | ORAL | 0 refills | Status: AC

## 2024-09-13 MED ORDER — FENTANYL 12 MCG/HR TD PT72: 1.0000 | MEDICATED_PATCH | TRANSDERMAL | 0 refills | Status: DC

## 2024-09-13 NOTE — Patient Instructions (Signed)
 [facility] A DEPT OF [place]  Discharge Instructions: Thank you for choosing [facility] to provide your oncology and hematology care.   If you have a lab appointment with the Cancer Center, please go directly to the Cancer Center and check in at the registration area.   Wear comfortable clothing and clothing appropriate for easy access to any Portacath or PICC line.   We strive to give you quality time with your provider. You may need to reschedule your appointment if you arrive late (15 or more minutes).  Arriving late affects you and other patients whose appointments are after yours.  Also, if you miss three or more appointments without notifying the office, you may be dismissed from the clinic at the provider's discretion.      For prescription refill requests, have your pharmacy contact our office and allow 72 hours for refills to be completed.    Today you received the following chemotherapy and/or immunotherapy agents tecvayli       To help prevent nausea and vomiting after your treatment, we encourage you to take your nausea medication as directed.  BELOW ARE SYMPTOMS THAT SHOULD BE REPORTED IMMEDIATELY: *FEVER GREATER THAN 100.4 F (38 C) OR HIGHER *CHILLS OR SWEATING *NAUSEA AND VOMITING THAT IS NOT CONTROLLED WITH YOUR NAUSEA MEDICATION *UNUSUAL SHORTNESS OF BREATH *UNUSUAL BRUISING OR BLEEDING *URINARY PROBLEMS (pain or burning when urinating, or frequent urination) *BOWEL PROBLEMS (unusual diarrhea, constipation, pain near the anus) TENDERNESS IN MOUTH AND THROAT WITH OR WITHOUT PRESENCE OF ULCERS (sore throat, sores in mouth, or a toothache) UNUSUAL RASH, SWELLING OR PAIN  UNUSUAL VAGINAL DISCHARGE OR ITCHING   Items with * indicate a potential emergency and should be followed up as soon as possible or go to the Emergency Department if any problems should occur.  Please show the CHEMOTHERAPY ALERT CARD or IMMUNOTHERAPY  ALERT CARD at check-in to the Emergency Department and triage nurse.  Should you have questions after your visit or need to cancel or reschedule your appointment, please contact [facility] DE[facility]  Dept: [phone]  and follow the prompts.  Office hours are 8:00 a.m. to 4:30 p.m. Monday - Friday. Please note that voicemails left after 4:00 p.m. may not be returned until the following business day.  We are closed weekends and major holidays. You have access to a nurse at all times for urgent questions. Please call the main number to the clinic Dept: [phone] and follow the prompts.   For any non-urgent questions, you may also contact your provider using MyChart. We now offer e-Visits for anyone 71 and older to request care online for non-urgent symptoms. For details visit [email or web address].   Also download the MyChart app! Go to the app store, search MyChart, open the app, select [place], and log in with your MyChart username and password.

## 2024-09-13 NOTE — Progress Notes (Addendum)
 HEMATOLOGY ONCOLOGY PROGRESS NOTE  Date of service: 09/13/2024  Patient Care Team: Megan Garnette KIDD, MD as PCP - General (Family Medicine) Megan Megan Brink, MD as Consulting Physician (Hematology) Bond, Reyes Mcardle, MD as Referring Physician (Ophthalmology) Evern Nian, Rogue, MD as Consulting Physician (Hematology and Oncology) Trixie File, MD as Consulting Physician (Internal Medicine) Eletha Boas, MD as Consulting Physician (General Surgery) Nicholaus Sherlean CROME, [facility] (Inactive) as Pharmacist (Pharmacist)  CHIEF COMPLAINT/PURPOSE OF CONSULTATION: Follow-up for continued evaluation and management of Multiple Myeloma   HISTORY OF PRESENTING ILLNESS: See previous noted for details on initial presentation    SUMMARY OF ONCOLOGIC HISTORY: Oncology History  Multiple myeloma not having achieved remission (HCC)  12/12/2018 Initial Diagnosis   Multiple myeloma not having achieved remission (HCC)   12/20/2018 - 10/04/2019 Chemotherapy   The patient had dexamethasone  (DECADRON ) tablet 20 mg, 20 mg (100 % of original dose 20 mg), Oral, Once, 14 of 14 cycles Dose modification: 20 mg (original dose 20 mg, Cycle 1), 10 mg (original dose 20 mg, Cycle 14) Administration: 20 mg (12/20/2018), 20 mg (12/27/2018), 20 mg (01/10/2019), 20 mg (01/17/2019), 20 mg (01/31/2019), 20 mg (02/07/2019), 20 mg (03/14/2019), 20 mg (03/21/2019), 20 mg (02/21/2019), 20 mg (02/28/2019), 20 mg (04/04/2019), 20 mg (04/11/2019), 20 mg (04/25/2019), 20 mg (05/02/2019), 20 mg (05/16/2019), 20 mg (05/23/2019), 20 mg (06/06/2019), 20 mg (06/13/2019), 20 mg (06/27/2019), 20 mg (07/04/2019), 20 mg (07/18/2019), 20 mg (07/25/2019), 20 mg (08/08/2019), 20 mg (08/15/2019), 20 mg (08/29/2019), 20 mg (09/05/2019), 10 mg (09/19/2019) lenalidomide  (REVLIMID ) 15 MG capsule, 1 of 1 cycle, Start date: 01/18/2019, End date: 02/21/2019 bortezomib  SQ (VELCADE ) chemo injection 2 mg, 1.3 mg/m2 = 2 mg, Subcutaneous,  Once, 14 of 14 cycles Administration: 2  mg (12/20/2018), 2 mg (12/27/2018), 2 mg (12/23/2018), 2 mg (12/30/2018), 2 mg (01/10/2019), 2 mg (01/17/2019), 2 mg (01/13/2019), 2 mg (01/20/2019), 2 mg (01/31/2019), 2 mg (02/07/2019), 2 mg (02/03/2019), 2 mg (02/10/2019), 2 mg (03/14/2019), 2 mg (03/17/2019), 2 mg (03/21/2019), 2 mg (03/24/2019), 2 mg (02/21/2019), 2 mg (02/24/2019), 2 mg (02/28/2019), 2 mg (03/03/2019), 2 mg (04/04/2019), 2 mg (04/06/2019), 2 mg (04/11/2019), 2 mg (04/14/2019), 2 mg (04/25/2019), 2 mg (04/28/2019), 2 mg (05/02/2019), 2 mg (05/05/2019), 2 mg (05/16/2019), 2 mg (05/19/2019), 2 mg (05/23/2019), 2 mg (05/26/2019), 2 mg (06/06/2019), 2 mg (06/09/2019), 2 mg (06/13/2019), 2 mg (06/16/2019), 2 mg (06/27/2019), 2 mg (06/30/2019), 2 mg (07/04/2019), 2 mg (07/07/2019), 2 mg (07/18/2019), 2 mg (07/21/2019), 2 mg (07/25/2019), 2 mg (07/28/2019), 2 mg (08/08/2019), 2 mg (08/11/2019), 2 mg (08/15/2019), 2 mg (08/18/2019), 2 mg (08/29/2019), 2 mg (09/01/2019), 2 mg (09/05/2019), 2 mg (09/08/2019), 2 mg (09/19/2019), 2 mg (10/04/2019)  for chemotherapy treatment.    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)  04/16/2021 Initial Diagnosis   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)   04/30/2021 - 04/15/2022 Chemotherapy   Patient is on Treatment Plan : MYELOMA RELAPSED/REFRACTORY KCd q28d     05/12/2022 - 07/08/2022 Chemotherapy   Patient is on Treatment Plan : MYELOMA Daratumumab  + Pomalidomide  + Dexamethasone  q28d x 7 cycles     05/22/2022 - 05/18/2023 Chemotherapy   Patient is on Treatment Plan : MYELOMA Daratumumab  IV + Pomalidomide  + Dexamethasone  q28d x 7 cycles     03/15/2024 -  Chemotherapy   Patient is on Treatment Plan : MYELOMA Maintenance Teclistamab -cqyv SQ, D1,15 q28d       INTERVAL HISTORY: [name] is a 83 y.o. female who is here today for continued evaluation and  management of multiple myeloma.   She was last seen by me on 07/19/2024; at the time she mentioned experiencing fatigue.  Today, she is feeling well overall. She notes that the Gabapentin  medication is making her  drowsy. She has been taking the Gabapentin  for tingling in a facial nerve in the jaw and nasal area as suggested by her dentist.  We discussed cutting down the gabapentin  to 100 mg p.o. at bedtime till she runs out of them in the next week and then stopping this since she has not noticed any difference with using the gabapentin  and home symptoms have improved.  She does note a skin nodule on her back that she has had for several months to year and has been gradually growing.  This is irritating her since it is exactly at the bra line posteriorly in the midline and is now showing some minor erosive changes in the skin.  She was recommended to follow-up with her dermatologist for possible biopsy/excision. she will be visiting Dermatology (Dr. [name]) for the abnormality. She plans to have it biopsied and removed.   She notes no new fevers or chills, drenching night sweats, unexpected weight loss, new focal bone pains. Following with spirits. Does note that she went to her ophthalmologist who notes that her eye pressures have been stable even with the prednisone . Her LFTs are improving with the prednisone  and she is on a continued taper and is currently on 20 mg a day.  REVIEW OF SYSTEMS:   10 Point review of systems of done and is negative except as noted above.  MEDICAL HISTORY Past Medical History:  Diagnosis Date   Anemia    Blood transfusion without reported diagnosis Yrs ago when going through menopause   Cataract    Glaucoma    HYPERTENSION 03/11/2007   Multiple myeloma (HCC)    OSTEOPENIA 03/11/2007   Pre-diabetes    Thyroid  cancer (HCC)    Thyroid  disease    Tubular adenoma of colon 07/2015    SURGICAL HISTORY Past Surgical History:  Procedure Laterality Date   BONE MARROW BIOPSY     multiple   BREAST EXCISIONAL BIOPSY Right 2000   BREAST LUMPECTOMY  1990   benign   CATARACT EXTRACTION Bilateral 2018   DILATION AND CURETTAGE OF UTERUS     bleeding at menopause. No  uterine cancer   EYE SURGERY  2017, 2018. 2019   IR IMAGING GUIDED PORT INSERTION  05/16/2021   IR RADIOLOGIST EVAL & MGMT  12/13/2018   THYROIDECTOMY N/A 08/02/2020   Procedure: TOTAL THYROIDECTOMY;  Surgeon: Eletha Boas, MD;  Location: [facility];  Service: General;  Laterality: N/A;   TONSILLECTOMY     age 1    SOCIAL HISTORY Social History   Tobacco Use   Smoking status: Former    Current packs/day: 0.00    Average packs/day: 0.5 packs/day for 7.0 years (3.5 ttl pk-yrs)    Types: Cigarettes    Start date: 01/04/1954    Quit date: 01/04/1961    Years since quitting: 63.7   Smokeless tobacco: Never  Vaping Use   Vaping status: Never Used  Substance Use Topics   Alcohol use: Not Currently    Alcohol/week: 1.0 standard drink of alcohol    Types: 1 Standard drinks or equivalent per week    Comment: occas    Drug use: No    Social History   Social History Narrative   Married. Lives with husband (patient of Dr. Katrinka). 1  son. No grandkids. 1 granddog.       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, swing dancing, dinner, read       SOCIAL DRIVERS OF HEALTH SDOH Screenings   Food Insecurity: No Food Insecurity (06/08/2024)  Housing: Unknown (06/08/2024)  Transportation Needs: Unknown (06/08/2024)  Utilities: Not At Risk (02/21/2024)  Alcohol Screen: Low Risk  (06/08/2024)  Depression (PHQ2-9): Low Risk  (08/09/2024)  Financial Resource Strain: Low Risk  (06/08/2024)  Physical Activity: Insufficiently Active (06/08/2024)  Social Connections: Socially Integrated (06/08/2024)  Stress: No Stress Concern Present (06/08/2024)  Tobacco Use: Medium Risk (09/04/2024)   Received from [facility]  Health Literacy: Adequate Health Literacy (02/21/2024)     FAMILY HISTORY Family History  Problem Relation Age of Onset   Heart disease Mother        CHF mother died 28   Arthritis Mother    Glaucoma Mother         sister as well   Alcohol abuse Father    Suicidality Father    Heart disease Sister        aortic valve replacement   Lung cancer Sister        smoker   Cancer Sister    Hyperlipidemia Brother    Hypertension Brother    COPD Brother    Colon polyps Brother    Arthritis Sister    Hypertension Sister    Glaucoma Sister    Hashimoto's thyroiditis Sister    Colon polyps Sister    Diabetes Sister    Hypertension Son    Stroke Maternal Grandmother    Cystic fibrosis Niece    Colon cancer Neg Hx      ALLERGIES: is allergic to ace inhibitors, benadryl [diphenhydramine], diamox [acetazolamide], sulfamethoxazole, lenalidomide , and penicillins.  MEDICATIONS  Current Outpatient Medications  Medication Sig Dispense Refill   acyclovir  (ZOVIRAX ) 400 MG tablet Take 1 tablet (400 mg total) by mouth 2 (two) times daily. 60 tablet 3   ALPHAGAN  P 0.1 % SOLN Place 1 drop into both eyes in the morning, at noon, and at bedtime.      amLODipine  (NORVASC ) 10 MG tablet Take 1 tablet (10 mg total) by mouth daily. 90 tablet 3   aspirin EC 81 MG tablet Take 81 mg by mouth daily.     azithromycin  (ZITHROMAX ) 250 MG tablet Take 2 tablets on day 1 and then 1 tablet on on days 2-5. (Patient not taking: Reported on 07/19/2024) 6 each 0   b complex vitamins capsule Take 1 capsule by mouth daily.     Calcium  Carb-Cholecalciferol (CALCIUM  600 + D PO) Take by mouth.     Cholecalciferol (VITAMIN D3) 125 MCG (5000 UT) TABS Take 1,000 Units by mouth daily.     Co-Enzyme Q-10 100 MG CAPS Take 100 mg by mouth daily.     cyanocobalamin  (VITAMIN B12) 500 MCG tablet Take 500 mcg by mouth every other day.     dapsone  100 MG tablet Take 1 tablet (100 mg total) by mouth daily. 30 tablet 3   dorzolamide -timolol  (COSOPT ) 22.3-6.8 MG/ML ophthalmic solution Place 1 drop into both eyes 2 (two) times daily.   11   fentaNYL  (DURAGESIC ) 12 MCG/HR Place 1 patch onto the skin every 3 (three) days. 10 patch 0   filgrastim -sndz  (ZARXIO ) 300 MCG/0.5ML SOSY injection Inject 0.5 mLs (300 mcg total) into the skin 2 (two) times  a week. On Monday and thursday (Patient not taking: Reported on 08/16/2024) 4 mL 2   gabapentin  (NEURONTIN ) 100 MG capsule Take 2 capsules (200 mg total) by mouth at bedtime. 60 capsule 0   levothyroxine  (SYNTHROID ) 88 MCG tablet Take 1 tablet (88 mcg total) by mouth daily before breakfast. 90 tablet 3   lidocaine -prilocaine  (EMLA ) cream Apply to affected area once 30 g 3   magnesium  chloride (SLOW-MAG) 64 MG TBEC SR tablet Take by mouth.     Multiple Vitamins-Minerals (MULTIVITAMIN WITH MINERALS) tablet Take 1 tablet by mouth daily. Centrum Silver 50 +     Netarsudil-Latanoprost  (ROCKLATAN) 0.02-0.005 % SOLN Place 1 drop into both eyes daily.     Omega-3 1000 MG CAPS Take 1,000 mg by mouth daily.      ondansetron  (ZOFRAN ) 8 MG tablet Take 1 tablet (8 mg total) by mouth every 8 (eight) hours as needed for nausea or vomiting. 30 tablet 1   oxyCODONE -acetaminophen  (PERCOCET) 5-325 MG tablet Take 1-2 tablets by mouth every 4 (four) hours as needed for moderate pain or severe pain. 90 tablet 0   polyethylene glycol (MIRALAX ) packet Take 17 g by mouth daily. 30 each 1   predniSONE  (DELTASONE ) 10 MG tablet Take 5 tablets (50 mg total) by mouth daily with breakfast for 7 days, THEN 4 tablets (40 mg total) daily with breakfast for 7 days, THEN 3 tablets (30 mg total) daily with breakfast for 7 days, THEN 2 tablets (20 mg total) daily with breakfast for 7 days, THEN 1 tablet (10 mg total) daily with breakfast for 7 days. 105 tablet 0   prochlorperazine  (COMPAZINE ) 10 MG tablet Take 1 tablet (10 mg total) by mouth every 6 (six) hours as needed for nausea or vomiting. 30 tablet 1   senna-docusate (EQ STOOL SOFTENER/LAXATIVE) 8.6-50 MG tablet Take 1 tablet by mouth at bedtime. 60 tablet 2   Simethicone  (GAS-X PO) Take 1 tablet by mouth as needed.     teclistamab -cqyv SQ (TECVAYLI ) 153 MG/1.7ML Inject 1.5 mg/kg into  the skin once.     vitamin C (ASCORBIC ACID) 500 MG tablet Take 500 mg by mouth 3 (three) times a week.     No current facility-administered medications for this visit.    PHYSICAL EXAMINATION: ECOG PERFORMANCE STATUS: 0 - Asymptomatic VITALS: Vitals:   09/13/24 1108 09/13/24 1120  BP: (!) 164/78 (!) 148/75  Pulse: 73   Resp: 18   Temp: 97.9 F (36.6 C)   SpO2: 91%    Filed Weights   09/13/24 1108  Weight: 103 lb (46.7 kg)   Body mass index is 20.12 kg/m.  GENERAL: alert, in no acute distress and comfortable SKIN: no acute rashes, no significant lesions EYES: conjunctiva are pink and non-injected, sclera anicteric OROPHARYNX: MMM, no exudates, no oropharyngeal erythema or ulceration NECK: supple, no JVD LYMPH:  no palpable lymphadenopathy in the cervical, axillary or inguinal regions LUNGS: clear to auscultation b/l with normal respiratory effort HEART: regular rate & rhythm ABDOMEN:  normoactive bowel sounds , non tender, not distended, no hepatosplenomegaly Extremity: no pedal edema PSYCH: alert & oriented x 3 with fluent speech NEURO: no focal motor/sensory deficits  LABORATORY DATA:   I have reviewed the data as listed     Latest Ref Rng & Units 09/13/2024   10:39 AM 08/30/2024    8:51 AM 08/23/2024    8:59 AM  CBC EXTENDED  WBC 4.0 - 10.5 K/uL 5.5  9.9  3.4   RBC 3.87 -  5.11 MIL/uL 2.89  2.89  2.78   Hemoglobin 12.0 - 15.0 g/dL 89.4  9.9  9.4   HCT 63.9 - 46.0 % 31.3  29.8  28.5   Platelets 150 - 400 K/uL 165  243  218   NEUT# 1.7 - 7.7 K/uL 5.1  9.1  2.2   Lymph# 0.7 - 4.0 K/uL 0.2  0.3  0.3         Latest Ref Rng & Units 09/13/2024   10:39 AM 08/30/2024    8:51 AM 08/23/2024    8:59 AM  CMP  Glucose 70 - 99 mg/dL 850  883  896   BUN 8 - 23 mg/dL 18  23  15    Creatinine 0.44 - 1.00 mg/dL 9.29  9.24  9.33   Sodium 135 - 145 mmol/L 140  138  137   Potassium 3.5 - 5.1 mmol/L 4.2  4.6  4.4   Chloride 98 - 111 mmol/L 106  104  102   CO2 22 - 32  mmol/L 26  26  27    Calcium  8.9 - 10.3 mg/dL 8.8  9.2  9.1   Total Protein 6.5 - 8.1 g/dL 6.0  6.1  5.9   Total Bilirubin 0.0 - 1.2 mg/dL 0.8  0.6  0.5   Alkaline Phos 38 - 126 U/L 40  53  58   AST 15 - 41 U/L 46  55  124   ALT 0 - 44 U/L 117  148  268    Kappa/Lambda light chains Component     Latest Ref Rng 08/16/2024  Kappa free light chain     3.3 - 19.4 mg/L <0.7 (L)   Lambda free light chains     5.7 - 26.3 mg/L <1.5 (L)   Kappa, lambda light chain ratio UNABLE TO CALCULATE     Legend: (L) Low  Multiple Myeloma Panel Component     Latest Ref Rng 08/16/2024  IgG (Immunoglobin G), Serum     586 - 1,602 mg/dL 472 (L)   IgA     64 - 422 mg/dL <5 (L)   IgM (Immunoglobulin M), Srm     26 - 217 mg/dL <5 (L)   Total Protein ELP     6.0 - 8.5 g/dL 5.9 (L) (C)  Albumin  SerPl Elph-Mcnc     2.9 - 4.4 g/dL 3.9 (C)  Alpha 1     0.0 - 0.4 g/dL 0.3 (C)  Alpha2 Glob SerPl Elph-Mcnc     0.4 - 1.0 g/dL 0.5 (C)  B-Globulin SerPl Elph-Mcnc     0.7 - 1.3 g/dL 0.8 (C)  Gamma Glob SerPl Elph-Mcnc     0.4 - 1.8 g/dL 0.4 (C)  M Protein SerPl Elph-Mcnc     Not Observed g/dL Not Observed (C)  Globulin, Total     2.2 - 3.9 g/dL 2.0 (L) (C)  Albumin /Glob SerPl     0.7 - 1.7  2.0 (H) (C)  IFE 1 Comment ! (C)  Please Note (HCV): Comment (C)    Legend: (L) Low (H) High ! Abnormal (C) Corrected   MULTIPLE MYELOMA PANEL 02/2023 - 06/2024 AND KAPPA/LAMDA LIGHT CHAINS 02/2022 - 06/2024      RADIOGRAPHIC STUDIES: I have personally reviewed the radiological images as listed and agreed with the findings in the report. US  Abdomen Limited RUQ (LIVER/GB) Result Date: 08/22/2024 CLINICAL DATA:  elevated liver enzymes EXAM: ULTRASOUND ABDOMEN LIMITED RIGHT UPPER QUADRANT COMPARISON:  December 25, 2021 FINDINGS: Gallbladder: No  gallstones or wall thickening visualized. No sonographic Murphy sign noted by sonographer. Common bile duct: Diameter: Visualized portion measures 4 mm, within normal  limits. Liver: Benign cyst is noted measuring 10 x 7 x 13 mm. Mildly heterogeneous in parenchymal echogenicity. Lobular liver contours. Portal vein is patent on color Doppler imaging with normal direction of blood flow towards the liver. Other: None. IMPRESSION: Lobular liver contours with mildly heterogeneous parenchymal echogenicity. Findings are nonspecific but can be seen in the setting of underlying hepatocellular disease. Electronically Signed   By: Corean Salter M.D.   On: 08/22/2024 07:51   DG Chest 2 View Result Date: 06/21/2024 CLINICAL DATA:  Dry cough. EXAM: CHEST - 2 VIEW COMPARISON:  Radiograph 08/01/2020. FINDINGS: Right chest port with tip in the SVC. Normal heart size with stable mediastinal contours. No focal airspace disease. Normal pulmonary vasculature. No pleural fluid or pneumothorax. The bones are diffusely under mineralized. Numerous compression deformities in the thoracic spine, grossly unchanged from prior exam. IMPRESSION: 1. No acute chest findings or explanation for cough. 2. Right chest port with tip in the SVC. Electronically Signed   By: Andrea Gasman M.D.   On: 06/21/2024 13:32    ASSESSMENT & PLAN:  83 y.o. female with  1. 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 -Transitioned to PO Cytoxan /Velcade /Dex of each 21-day cycle in May 2020 -Transitioned to maintenance ixazomib therapy in December 2020. -Due to progression of disease in July 2022, started Kyprolis /Dex therapy -Due to progression of disease in August 2023, switched to Dara/Pom/Dex -Due to progression of disease in September 2024, switched to elotuzumab /velcade /dex -Due to progression of disease in October 2024, switched  to teclistamab  at [facility]. Tolerated step up therapy without CRS or ICANS.  -Cycle 8, Day 1 of Teclistamab  was delayed by one week 2/2 neutropenia, started on Zarxio  M/W/F of off weeks of therapy.  -Transitioned to Zarxio  Mon/Thurs weekly starting Cycle 9 of Teclistamab .    2.  Cancer related pain  -Currently well controlled -Continue fentanyl  patch at 12 mcg/h and Percocet for break through pain.   3.  Elevated transaminases thought to be possibly related to drug-induced liver injury from her bite therapy.  EBV and CMV and hepatitis B reactivation were evaluated and ruled out. Patient is on a prednisone  taper with improving transaminases. PLAN: - Discussed lab results on 09/13/2024 in detail with patient: - Multiple myeloma panel and kappa/lambda panel are pending from today but the last labs about a month ago showed that she continues to be in remission - CBC is stable with improvement in hemoglobin up to 10.5 - CMP is stable with normal creatinine of 0.7 normal calcium  levels.  Her AST and ALT continue to improve on the prednisone . -Continue prednisone  taper as per plan -Will continue Solu-Medrol  60 mg as premedication for her Teclistamab  context of possible DILI. - Continue maintenance Teclistamab  as per integrated scheduling. Continue other supportive medications including her acyclovir  aspirin, dapsone . Continue current pain management. -Patient is up to speed with all her vaccinations - Continue Zometa  every 12 weeks x 4 - Continue IVIG every 4 weeks  x 12 - Patient will call her dermatologist Dr. [name] to evaluate the small nodule on her mid back.  FOLLOW-UP in 4 weeks for labs and follow-up with Dr. Onesimo.  The total time spent in the appointment was 32 minutes* .  All of the patient's questions were answered and the patient knows to call the clinic with any problems, questions, or concerns.  Megan Onesimo MD MS AAHIVMS [facility] Hematology/Oncology  Physician [facility]  *Total Encounter Time as defined by the Centers for Medicare and Medicaid Services includes, in addition to the face-to-face time of a patient visit (documented in the note above) non-face-to-face time: obtaining and reviewing outside history, ordering and reviewing medications, tests or procedures, care coordination (communications with other health care professionals or caregivers) and documentation in the medical record.  I, Marijo Sharps, acting as a neurosurgeon for Megan Onesimo, MD.,have documented all relevant documentation on the behalf of Megan Onesimo, MD,as directed by  Megan Onesimo, MD while in the presence of Megan Onesimo, MD.  I have reviewed the above documentation for accuracy and completeness, and I agree with the above.  Joelynn Dust, MD

## 2024-09-14 LAB — KAPPA/LAMBDA LIGHT CHAINS
Kappa free light chain: <0.7 mg/L — ABNORMAL LOW (ref 3.3–19.4)
Kappa, lambda light chain ratio: UNDETERMINED
Lambda free light chains: <1.5 mg/L — ABNORMAL LOW (ref 5.7–26.3)

## 2024-09-18 LAB — MULTIPLE MYELOMA PANEL, SERUM
Albumin SerPl Elph-Mcnc: 4 g/dL (ref 2.9–4.4)
Albumin/Glob SerPl: 2.4 — ABNORMAL HIGH (ref 0.7–1.7)
Alpha 1: 0.2 g/dL (ref 0.0–0.4)
Alpha2 Glob SerPl Elph-Mcnc: 0.4 g/dL (ref 0.4–1.0)
B-Globulin SerPl Elph-Mcnc: 0.7 g/dL (ref 0.7–1.3)
Gamma Glob SerPl Elph-Mcnc: 0.3 g/dL — ABNORMAL LOW (ref 0.4–1.8)
Globulin, Total: 1.7 g/dL — ABNORMAL LOW (ref 2.2–3.9)
IgA: <5 mg/dL — ABNORMAL LOW (ref 64–422)
IgG (Immunoglobin G), Serum: 381 mg/dL — ABNORMAL LOW (ref 586–1602)
IgM (Immunoglobulin M), Srm: <5 mg/dL — ABNORMAL LOW (ref 26–217)
Total Protein ELP: 5.7 g/dL — ABNORMAL LOW (ref 6.0–8.5)

## 2024-09-19 ENCOUNTER — Other Ambulatory Visit: Payer: Self-pay

## 2024-09-19 DIAGNOSIS — C9001 Multiple myeloma in remission: Secondary | ICD-10-CM

## 2024-09-19 MED ORDER — DAPSONE 100 MG PO TABS: 100.0000 mg | ORAL_TABLET | Freq: Every day | ORAL | 3 refills | Status: AC

## 2024-09-27 ENCOUNTER — Inpatient Hospital Stay: Admitting: Physician Assistant

## 2024-09-27 ENCOUNTER — Inpatient Hospital Stay

## 2024-09-29 ENCOUNTER — Inpatient Hospital Stay

## 2024-09-29 VITALS — BP 149/66 | HR 66 | Temp 97.9°F | Resp 17 | Ht 60.0 in | Wt 103.0 lb

## 2024-09-29 DIAGNOSIS — C9002 Multiple myeloma in relapse: Secondary | ICD-10-CM

## 2024-09-29 DIAGNOSIS — Z5111 Encounter for antineoplastic chemotherapy: Secondary | ICD-10-CM | POA: Diagnosis not present

## 2024-09-29 DIAGNOSIS — Z7189 Other specified counseling: Secondary | ICD-10-CM

## 2024-09-29 LAB — CBC WITH DIFFERENTIAL (CANCER CENTER ONLY)
Abs Immature Granulocytes: 0.04 K/uL (ref 0.00–0.07)
Basophils Absolute: 0 K/uL (ref 0.0–0.1)
Basophils Relative: 1 %
Eosinophils Absolute: 0.1 K/uL (ref 0.0–0.5)
Eosinophils Relative: 2 %
HCT: 29.4 % — ABNORMAL LOW (ref 36.0–46.0)
Hemoglobin: 9.8 g/dL — ABNORMAL LOW (ref 12.0–15.0)
Immature Granulocytes: 1 %
Lymphocytes Relative: 7 %
Lymphs Abs: 0.2 K/uL — ABNORMAL LOW (ref 0.7–4.0)
MCH: 38.7 pg — ABNORMAL HIGH (ref 26.0–34.0)
MCHC: 33.3 g/dL (ref 30.0–36.0)
MCV: 116.2 fL — ABNORMAL HIGH (ref 80.0–100.0)
Monocytes Absolute: 0.5 K/uL (ref 0.1–1.0)
Monocytes Relative: 16 %
Neutro Abs: 2.1 K/uL (ref 1.7–7.7)
Neutrophils Relative %: 73 %
Platelet Count: 219 K/uL (ref 150–400)
RBC: 2.53 MIL/uL — ABNORMAL LOW (ref 3.87–5.11)
RDW: 17.2 % — ABNORMAL HIGH (ref 11.5–15.5)
WBC Count: 2.9 K/uL — ABNORMAL LOW (ref 4.0–10.5)
nRBC: 0 % (ref 0.0–0.2)

## 2024-09-29 LAB — CMP (CANCER CENTER ONLY)
ALT: 151 U/L — ABNORMAL HIGH (ref 0–44)
AST: 60 U/L — ABNORMAL HIGH (ref 15–41)
Albumin: 4.3 g/dL (ref 3.5–5.0)
Alkaline Phosphatase: 38 U/L (ref 38–126)
Anion gap: 8 (ref 5–15)
BUN: 23 mg/dL (ref 8–23)
CO2: 25 mmol/L (ref 22–32)
Calcium: 8.6 mg/dL — ABNORMAL LOW (ref 8.9–10.3)
Chloride: 106 mmol/L (ref 98–111)
Creatinine: 0.67 mg/dL (ref 0.44–1.00)
GFR, Estimated: >60 mL/min
Glucose, Bld: 110 mg/dL — ABNORMAL HIGH (ref 70–99)
Potassium: 4.5 mmol/L (ref 3.5–5.1)
Sodium: 139 mmol/L (ref 135–145)
Total Bilirubin: 0.9 mg/dL (ref 0.0–1.2)
Total Protein: 5.8 g/dL — ABNORMAL LOW (ref 6.5–8.1)

## 2024-09-29 MED ORDER — TECLISTAMAB-CQYV CHEMO 153 MG/1.7ML SQ SOLN
1.5000 mg/kg | Freq: Once | SUBCUTANEOUS | Status: AC
  Administered 2024-09-29: 70 mg via SUBCUTANEOUS
  Filled 2024-09-29: qty 0.78

## 2024-09-29 MED ORDER — METHYLPREDNISOLONE SODIUM SUCC 125 MG IJ SOLR
80.0000 mg | Freq: Every day | INTRAMUSCULAR | Status: DC
  Administered 2024-09-29: 80 mg via INTRAVENOUS
  Filled 2024-09-29: qty 2

## 2024-09-29 NOTE — Patient Instructions (Signed)
 [facility] A DEPT OF [place]  Discharge Instructions: Thank you for choosing [facility] to provide your oncology and hematology care.   If you have a lab appointment with the Cancer Center, please go directly to the Cancer Center and check in at the registration area.   Wear comfortable clothing and clothing appropriate for easy access to any Portacath or PICC line.   We strive to give you quality time with your provider. You may need to reschedule your appointment if you arrive late (15 or more minutes).  Arriving late affects you and other patients whose appointments are after yours.  Also, if you miss three or more appointments without notifying the office, you may be dismissed from the clinic at the provider's discretion.      For prescription refill requests, have your pharmacy contact our office and allow 72 hours for refills to be completed.    Today you received the following chemotherapy and/or immunotherapy agents tecvayli       To help prevent nausea and vomiting after your treatment, we encourage you to take your nausea medication as directed.  BELOW ARE SYMPTOMS THAT SHOULD BE REPORTED IMMEDIATELY: *FEVER GREATER THAN 100.4 F (38 C) OR HIGHER *CHILLS OR SWEATING *NAUSEA AND VOMITING THAT IS NOT CONTROLLED WITH YOUR NAUSEA MEDICATION *UNUSUAL SHORTNESS OF BREATH *UNUSUAL BRUISING OR BLEEDING *URINARY PROBLEMS (pain or burning when urinating, or frequent urination) *BOWEL PROBLEMS (unusual diarrhea, constipation, pain near the anus) TENDERNESS IN MOUTH AND THROAT WITH OR WITHOUT PRESENCE OF ULCERS (sore throat, sores in mouth, or a toothache) UNUSUAL RASH, SWELLING OR PAIN  UNUSUAL VAGINAL DISCHARGE OR ITCHING   Items with * indicate a potential emergency and should be followed up as soon as possible or go to the Emergency Department if any problems should occur.  Please show the CHEMOTHERAPY ALERT CARD or IMMUNOTHERAPY  ALERT CARD at check-in to the Emergency Department and triage nurse.  Should you have questions after your visit or need to cancel or reschedule your appointment, please contact [facility] DE[facility]  Dept: [phone]  and follow the prompts.  Office hours are 8:00 a.m. to 4:30 p.m. Monday - Friday. Please note that voicemails left after 4:00 p.m. may not be returned until the following business day.  We are closed weekends and major holidays. You have access to a nurse at all times for urgent questions. Please call the main number to the clinic Dept: [phone] and follow the prompts.   For any non-urgent questions, you may also contact your provider using MyChart. We now offer e-Visits for anyone 71 and older to request care online for non-urgent symptoms. For details visit [email or web address].   Also download the MyChart app! Go to the app store, search MyChart, open the app, select [place], and log in with your MyChart username and password.

## 2024-10-11 ENCOUNTER — Inpatient Hospital Stay: Admitting: Hematology

## 2024-10-11 ENCOUNTER — Inpatient Hospital Stay: Attending: Hematology

## 2024-10-11 ENCOUNTER — Inpatient Hospital Stay

## 2024-10-11 VITALS — BP 150/72 | HR 68 | Temp 98.2°F | Resp 18 | Wt 103.8 lb

## 2024-10-11 DIAGNOSIS — Z87891 Personal history of nicotine dependence: Secondary | ICD-10-CM | POA: Insufficient documentation

## 2024-10-11 DIAGNOSIS — Z85850 Personal history of malignant neoplasm of thyroid: Secondary | ICD-10-CM | POA: Insufficient documentation

## 2024-10-11 DIAGNOSIS — R6889 Other general symptoms and signs: Secondary | ICD-10-CM | POA: Diagnosis not present

## 2024-10-11 DIAGNOSIS — D709 Neutropenia, unspecified: Secondary | ICD-10-CM | POA: Diagnosis not present

## 2024-10-11 DIAGNOSIS — Z5111 Encounter for antineoplastic chemotherapy: Secondary | ICD-10-CM | POA: Insufficient documentation

## 2024-10-11 DIAGNOSIS — Z7982 Long term (current) use of aspirin: Secondary | ICD-10-CM | POA: Diagnosis not present

## 2024-10-11 DIAGNOSIS — C9002 Multiple myeloma in relapse: Secondary | ICD-10-CM

## 2024-10-11 DIAGNOSIS — K718 Toxic liver disease with other disorders of liver: Secondary | ICD-10-CM | POA: Diagnosis not present

## 2024-10-11 DIAGNOSIS — Z79624 Long term (current) use of inhibitors of nucleotide synthesis: Secondary | ICD-10-CM | POA: Diagnosis not present

## 2024-10-11 DIAGNOSIS — Z7189 Other specified counseling: Secondary | ICD-10-CM

## 2024-10-11 DIAGNOSIS — Z95828 Presence of other vascular implants and grafts: Secondary | ICD-10-CM

## 2024-10-11 DIAGNOSIS — Z79890 Hormone replacement therapy (postmenopausal): Secondary | ICD-10-CM | POA: Diagnosis not present

## 2024-10-11 DIAGNOSIS — M8580 Other specified disorders of bone density and structure, unspecified site: Secondary | ICD-10-CM | POA: Insufficient documentation

## 2024-10-11 DIAGNOSIS — Z79899 Other long term (current) drug therapy: Secondary | ICD-10-CM | POA: Diagnosis not present

## 2024-10-11 DIAGNOSIS — G893 Neoplasm related pain (acute) (chronic): Secondary | ICD-10-CM | POA: Insufficient documentation

## 2024-10-11 DIAGNOSIS — Z860101 Personal history of adenomatous and serrated colon polyps: Secondary | ICD-10-CM | POA: Insufficient documentation

## 2024-10-11 DIAGNOSIS — I10 Essential (primary) hypertension: Secondary | ICD-10-CM | POA: Insufficient documentation

## 2024-10-11 DIAGNOSIS — Z801 Family history of malignant neoplasm of trachea, bronchus and lung: Secondary | ICD-10-CM | POA: Diagnosis not present

## 2024-10-11 DIAGNOSIS — C9000 Multiple myeloma not having achieved remission: Secondary | ICD-10-CM

## 2024-10-11 LAB — CMP (CANCER CENTER ONLY)
ALT: 201 U/L — ABNORMAL HIGH (ref 0–44)
AST: 81 U/L — ABNORMAL HIGH (ref 15–41)
Albumin: 4.4 g/dL (ref 3.5–5.0)
Alkaline Phosphatase: 41 U/L (ref 38–126)
Anion gap: 8 (ref 5–15)
BUN: 18 mg/dL (ref 8–23)
CO2: 26 mmol/L (ref 22–32)
Calcium: 8.9 mg/dL (ref 8.9–10.3)
Chloride: 105 mmol/L (ref 98–111)
Creatinine: 0.65 mg/dL (ref 0.44–1.00)
GFR, Estimated: >60 mL/min
Glucose, Bld: 136 mg/dL — ABNORMAL HIGH (ref 70–99)
Potassium: 4.2 mmol/L (ref 3.5–5.1)
Sodium: 139 mmol/L (ref 135–145)
Total Bilirubin: 0.5 mg/dL (ref 0.0–1.2)
Total Protein: 5.9 g/dL — ABNORMAL LOW (ref 6.5–8.1)

## 2024-10-11 LAB — CBC WITH DIFFERENTIAL (CANCER CENTER ONLY)
Abs Immature Granulocytes: 0.01 K/uL (ref 0.00–0.07)
Basophils Absolute: 0.1 K/uL (ref 0.0–0.1)
Basophils Relative: 2 %
Eosinophils Absolute: 0.1 K/uL (ref 0.0–0.5)
Eosinophils Relative: 2 %
HCT: 28.7 % — ABNORMAL LOW (ref 36.0–46.0)
Hemoglobin: 9.4 g/dL — ABNORMAL LOW (ref 12.0–15.0)
Immature Granulocytes: 0 %
Lymphocytes Relative: 11 %
Lymphs Abs: 0.3 K/uL — ABNORMAL LOW (ref 0.7–4.0)
MCH: 38.1 pg — ABNORMAL HIGH (ref 26.0–34.0)
MCHC: 32.8 g/dL (ref 30.0–36.0)
MCV: 116.2 fL — ABNORMAL HIGH (ref 80.0–100.0)
Monocytes Absolute: 0.5 K/uL (ref 0.1–1.0)
Monocytes Relative: 17 %
Neutro Abs: 2 K/uL (ref 1.7–7.7)
Neutrophils Relative %: 68 %
Platelet Count: 274 K/uL (ref 150–400)
RBC: 2.47 MIL/uL — ABNORMAL LOW (ref 3.87–5.11)
RDW: 14.6 % (ref 11.5–15.5)
WBC Count: 3 K/uL — ABNORMAL LOW (ref 4.0–10.5)
nRBC: 0 % (ref 0.0–0.2)

## 2024-10-11 MED ORDER — SODIUM CHLORIDE 0.9 % IV SOLN: INTRAVENOUS | Status: DC | PRN

## 2024-10-11 MED ORDER — PREDNISONE 20 MG PO TABS: ORAL_TABLET | ORAL | 0 refills | Status: DC

## 2024-10-11 MED ORDER — ZOLEDRONIC ACID 4 MG/100ML IV SOLN
4.0000 mg | Freq: Once | INTRAVENOUS | Status: AC
  Administered 2024-10-11: 4 mg via INTRAVENOUS
  Filled 2024-10-11: qty 100

## 2024-10-11 NOTE — Progress Notes (Signed)
 " HEMATOLOGY ONCOLOGY PROGRESS NOTE  Date of service: 10/11/2024  Patient Care Team: Megan Garnette KIDD, MD as PCP - General (Family Medicine) Megan Emaline Brink, MD as Consulting Physician (Hematology) Bond, Reyes Mcardle, MD as Referring Physician (Ophthalmology) Megan Nian, Rogue, MD as Consulting Physician (Hematology and Oncology) Megan File, MD as Consulting Physician (Internal Medicine) Megan Boas, MD as Consulting Physician (General Surgery) Megan Olson, [facility] (Inactive) as Pharmacist (Pharmacist)  CHIEF COMPLAINT/PURPOSE OF CONSULTATION: Follow-up for continued evaluation and management of Multiple Myeloma   HISTORY OF PRESENTING ILLNESS: See previous noted for details on initial presentation    SUMMARY OF ONCOLOGIC HISTORY: Oncology History  Multiple myeloma not having achieved remission (HCC)  12/12/2018 Initial Diagnosis   Multiple myeloma not having achieved remission (HCC)   12/20/2018 - 10/04/2019 Chemotherapy   The patient had dexamethasone  (DECADRON ) tablet 20 mg, 20 mg (100 % of original dose 20 mg), Oral, Once, 14 of 14 cycles Dose modification: 20 mg (original dose 20 mg, Cycle 1), 10 mg (original dose 20 mg, Cycle 14) Administration: 20 mg (12/20/2018), 20 mg (12/27/2018), 20 mg (01/10/2019), 20 mg (01/17/2019), 20 mg (01/31/2019), 20 mg (02/07/2019), 20 mg (03/14/2019), 20 mg (03/21/2019), 20 mg (02/21/2019), 20 mg (02/28/2019), 20 mg (04/04/2019), 20 mg (04/11/2019), 20 mg (04/25/2019), 20 mg (05/02/2019), 20 mg (05/16/2019), 20 mg (05/23/2019), 20 mg (06/06/2019), 20 mg (06/13/2019), 20 mg (06/27/2019), 20 mg (07/04/2019), 20 mg (07/18/2019), 20 mg (07/25/2019), 20 mg (08/08/2019), 20 mg (08/15/2019), 20 mg (08/29/2019), 20 mg (09/05/2019), 10 mg (09/19/2019) lenalidomide  (REVLIMID ) 15 MG capsule, 1 of 1 cycle, Start date: 01/18/2019, End date: 02/21/2019 bortezomib  SQ (VELCADE ) chemo injection 2 mg, 1.3 mg/m2 = 2 mg, Subcutaneous,  Once, 14 of 14 cycles Administration: 2 mg  (12/20/2018), 2 mg (12/27/2018), 2 mg (12/23/2018), 2 mg (12/30/2018), 2 mg (01/10/2019), 2 mg (01/17/2019), 2 mg (01/13/2019), 2 mg (01/20/2019), 2 mg (01/31/2019), 2 mg (02/07/2019), 2 mg (02/03/2019), 2 mg (02/10/2019), 2 mg (03/14/2019), 2 mg (03/17/2019), 2 mg (03/21/2019), 2 mg (03/24/2019), 2 mg (02/21/2019), 2 mg (02/24/2019), 2 mg (02/28/2019), 2 mg (03/03/2019), 2 mg (04/04/2019), 2 mg (04/06/2019), 2 mg (04/11/2019), 2 mg (04/14/2019), 2 mg (04/25/2019), 2 mg (04/28/2019), 2 mg (05/02/2019), 2 mg (05/05/2019), 2 mg (05/16/2019), 2 mg (05/19/2019), 2 mg (05/23/2019), 2 mg (05/26/2019), 2 mg (06/06/2019), 2 mg (06/09/2019), 2 mg (06/13/2019), 2 mg (06/16/2019), 2 mg (06/27/2019), 2 mg (06/30/2019), 2 mg (07/04/2019), 2 mg (07/07/2019), 2 mg (07/18/2019), 2 mg (07/21/2019), 2 mg (07/25/2019), 2 mg (07/28/2019), 2 mg (08/08/2019), 2 mg (08/11/2019), 2 mg (08/15/2019), 2 mg (08/18/2019), 2 mg (08/29/2019), 2 mg (09/01/2019), 2 mg (09/05/2019), 2 mg (09/08/2019), 2 mg (09/19/2019), 2 mg (10/04/2019)  for chemotherapy treatment.    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)  04/16/2021 Initial Diagnosis   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)   04/30/2021 - 04/15/2022 Chemotherapy   Patient is on Treatment Plan : MYELOMA RELAPSED/REFRACTORY KCd q28d     05/12/2022 - 07/08/2022 Chemotherapy   Patient is on Treatment Plan : MYELOMA Daratumumab  + Pomalidomide  + Dexamethasone  q28d x 7 cycles     05/22/2022 - 05/18/2023 Chemotherapy   Patient is on Treatment Plan : MYELOMA Daratumumab  IV + Pomalidomide  + Dexamethasone  q28d x 7 cycles     03/15/2024 -  Chemotherapy   Patient is on Treatment Plan : MYELOMA Maintenance Teclistamab -cqyv SQ, D1,15 q28d       INTERVAL HISTORY: Megan Olson is a 84 y.o. female who is here today for continued evaluation  and management of Multiple Myeloma .   she was last seen by me on 09/13/2024; at the time she mentioned experiencing well overall, but did mention to have a skin nodule on her back that has been gradually growing over the last  several months.   Today, she is doing well overall. She does state that she has a cough that has an opaque, yellow colored phlegm.  She also mentions that she has dry eye issues, but she is taking eye drops as well as seeing an optometrist for the issue.   Denies infection.  REVIEW OF SYSTEMS:   10 Point review of systems of done and is negative except as noted above.  MEDICAL HISTORY Past Medical History:  Diagnosis Date   Anemia    Blood transfusion without reported diagnosis Yrs ago when going through menopause   Cataract    Glaucoma    HYPERTENSION 03/11/2007   Multiple myeloma (HCC)    OSTEOPENIA 03/11/2007   Pre-diabetes    Thyroid  cancer (HCC)    Thyroid  disease    Tubular adenoma of colon 07/2015    SURGICAL HISTORY Past Surgical History:  Procedure Laterality Date   BONE MARROW BIOPSY     multiple   BREAST EXCISIONAL BIOPSY Right 2000   BREAST LUMPECTOMY  1990   benign   CATARACT EXTRACTION Bilateral 2018   DILATION AND CURETTAGE OF UTERUS     bleeding at menopause. No uterine cancer   EYE SURGERY  2017, 2018. 2019   IR IMAGING GUIDED PORT INSERTION  05/16/2021   IR RADIOLOGIST EVAL & MGMT  12/13/2018   THYROIDECTOMY N/A 08/02/2020   Procedure: TOTAL THYROIDECTOMY;  Surgeon: Megan Boas, MD;  Location: [facility];  Service: General;  Laterality: N/A;   TONSILLECTOMY     age 40    SOCIAL HISTORY Social History[1]  Social History   Social History Narrative   Married. Lives with husband (patient of Dr. Katrinka). 1 son. No grandkids. 1 granddog.       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, swing dancing, dinner, read       SOCIAL DRIVERS OF HEALTH SDOH Screenings   Food Insecurity: No Food Insecurity (06/08/2024)  Housing: Unknown (06/08/2024)  Transportation Needs: Unknown (06/08/2024)  Utilities: Not At Risk (02/21/2024)  Alcohol Screen: Low Risk (06/08/2024)   Depression (PHQ2-9): Low Risk (10/11/2024)  Financial Resource Strain: Low Risk (06/08/2024)  Physical Activity: Insufficiently Active (06/08/2024)  Social Connections: Socially Integrated (06/08/2024)  Stress: No Stress Concern Present (06/08/2024)  Tobacco Use: Medium Risk (09/04/2024)   Received from [facility]  Health Literacy: Adequate Health Literacy (02/21/2024)     FAMILY HISTORY Family History  Problem Relation Age of Onset   Heart disease Mother        CHF mother died 64   Arthritis Mother    Glaucoma Mother        sister as well   Alcohol abuse Father    Suicidality Father    Heart disease Sister        aortic valve replacement   Lung cancer Sister        smoker   Cancer Sister    Hyperlipidemia Brother    Hypertension Brother    COPD Brother    Colon polyps Brother    Arthritis Sister    Hypertension Sister    Glaucoma Sister    Hashimoto's thyroiditis Sister  Colon polyps Sister    Diabetes Sister    Hypertension Son    Stroke Maternal Grandmother    Cystic fibrosis Niece    Colon cancer Neg Hx      ALLERGIES: is allergic to ace inhibitors, benadryl [diphenhydramine], diamox [acetazolamide], sulfamethoxazole, lenalidomide , and penicillins.  MEDICATIONS  Current Outpatient Medications  Medication Sig Dispense Refill   acyclovir  (ZOVIRAX ) 400 MG tablet Take 1 tablet (400 mg total) by mouth 2 (two) times daily. 60 tablet 3   ALPHAGAN  P 0.1 % SOLN Place 1 drop into both eyes in the morning, at noon, and at bedtime.      amLODipine  (NORVASC ) 10 MG tablet Take 1 tablet (10 mg total) by mouth daily. 90 tablet 3   aspirin EC 81 MG tablet Take 81 mg by mouth daily.     azithromycin  (ZITHROMAX ) 250 MG tablet Take 2 tablets on day 1 and then 1 tablet on on days 2-5. (Patient not taking: Reported on 09/13/2024) 6 each 0   b complex vitamins capsule Take 1 capsule by mouth daily.     Calcium  Carb-Cholecalciferol (CALCIUM  600 + D PO) Take by mouth.     Cholecalciferol  (VITAMIN D3) 125 MCG (5000 UT) TABS Take 1,000 Units by mouth daily.     Co-Enzyme Q-10 100 MG CAPS Take 100 mg by mouth daily.     cyanocobalamin  (VITAMIN B12) 500 MCG tablet Take 500 mcg by mouth every other day.     dapsone  100 MG tablet Take 1 tablet (100 mg total) by mouth daily. 30 tablet 3   dorzolamide -timolol  (COSOPT ) 22.3-6.8 MG/ML ophthalmic solution Place 1 drop into both eyes 2 (two) times daily.   11   fentaNYL  (DURAGESIC ) 12 MCG/HR Place 1 patch onto the skin every 3 (three) days. 10 patch 0   filgrastim -sndz (ZARXIO ) 300 MCG/0.5ML SOSY injection Inject 0.5 mLs (300 mcg total) into the skin 2 (two) times a week. On Monday and thursday (Patient not taking: Reported on 09/13/2024) 4 mL 2   gabapentin  (NEURONTIN ) 100 MG capsule Take 2 capsules (200 mg total) by mouth at bedtime. 60 capsule 0   levothyroxine  (SYNTHROID ) 88 MCG tablet Take 1 tablet (88 mcg total) by mouth daily before breakfast. 90 tablet 3   lidocaine -prilocaine  (EMLA ) cream Apply to affected area once 30 g 3   magnesium  chloride (SLOW-MAG) 64 MG TBEC SR tablet Take by mouth.     Multiple Vitamins-Minerals (MULTIVITAMIN WITH MINERALS) tablet Take 1 tablet by mouth daily. Centrum Silver 50 +     Netarsudil-Latanoprost  (ROCKLATAN) 0.02-0.005 % SOLN Place 1 drop into both eyes daily.     Omega-3 1000 MG CAPS Take 1,000 mg by mouth daily.      ondansetron  (ZOFRAN ) 8 MG tablet Take 1 tablet (8 mg total) by mouth every 8 (eight) hours as needed for nausea or vomiting. 30 tablet 1   oxyCODONE -acetaminophen  (PERCOCET) 5-325 MG tablet Take 1-2 tablets by mouth every 4 (four) hours as needed for moderate pain or severe pain. 90 tablet 0   polyethylene glycol (MIRALAX ) packet Take 17 g by mouth daily. 30 each 1   prochlorperazine  (COMPAZINE ) 10 MG tablet Take 1 tablet (10 mg total) by mouth every 6 (six) hours as needed for nausea or vomiting. 30 tablet 1   senna-docusate (EQ STOOL SOFTENER/LAXATIVE) 8.6-50 MG tablet Take 1 tablet  by mouth at bedtime. 60 tablet 2   Simethicone  (GAS-X PO) Take 1 tablet by mouth as needed.     teclistamab -marva  SQ (TECVAYLI ) 153 MG/1.7ML Inject 1.5 mg/kg into the skin once.     vitamin C (ASCORBIC ACID) 500 MG tablet Take 500 mg by mouth 3 (three) times a week.     No current facility-administered medications for this visit.    PHYSICAL EXAMINATION: ECOG PERFORMANCE STATUS: 0 - Asymptomatic VITALS: There were no vitals filed for this visit. There were no vitals filed for this visit. There is no height or weight on Olson to calculate BMI.  GENERAL: alert, in no acute distress and comfortable SKIN: no acute rashes, no significant lesions EYES: conjunctiva are pink and non-injected, sclera anicteric OROPHARYNX: MMM, no exudates, no oropharyngeal erythema or ulceration NECK: supple, no JVD LYMPH:  no palpable lymphadenopathy in the cervical, axillary or inguinal regions LUNGS: clear to auscultation b/l with normal respiratory effort HEART: regular rate & rhythm ABDOMEN:  normoactive bowel sounds , non tender, not distended, no hepatosplenomegaly Extremity: no pedal edema PSYCH: alert & oriented x 3 with fluent speech NEURO: no focal motor/sensory deficits  LABORATORY DATA:   I have reviewed the data as listed     Latest Ref Rng & Units 10/11/2024    1:52 PM 09/29/2024    9:49 AM 09/13/2024   10:39 AM  CBC EXTENDED  WBC 4.0 - 10.5 K/uL 3.0  2.9  5.5   RBC 3.87 - 5.11 MIL/uL 2.47  2.53  2.89   Hemoglobin 12.0 - 15.0 g/dL 9.4  9.8  89.4   HCT 63.9 - 46.0 % 28.7  29.4  31.3   Platelets 150 - 400 K/uL 274  219  165   NEUT# 1.7 - 7.7 K/uL 2.0  2.1  5.1   Lymph# 0.7 - 4.0 K/uL 0.3  0.2  0.2         Latest Ref Rng & Units 10/11/2024    1:52 PM 09/29/2024    9:49 AM 09/13/2024   10:39 AM  CMP  Glucose 70 - 99 mg/dL 863  889  850   BUN 8 - 23 mg/dL 18  23  18    Creatinine 0.44 - 1.00 mg/dL 9.34  9.32  9.29   Sodium 135 - 145 mmol/L 139  139  140   Potassium 3.5 - 5.1 mmol/L  4.2  4.5  4.2   Chloride 98 - 111 mmol/L 105  106  106   CO2 22 - 32 mmol/L 26  25  26    Calcium  8.9 - 10.3 mg/dL 8.9  8.6  8.8   Total Protein 6.5 - 8.1 g/dL 5.9  5.8  6.0   Total Bilirubin 0.0 - 1.2 mg/dL 0.5  0.9  0.8   Alkaline Phos 38 - 126 U/L 41  38  40   AST 15 - 41 U/L 81  60  46   ALT 0 - 44 U/L 201  151  117    Multiple Myeloma Panel   Kappa/ Lambda Light Chains: Component     Latest Ref Rng 09/13/2024  Kappa free light chain     3.3 - 19.4 mg/L <0.7 (L)   Lambda free light chains     5.7 - 26.3 mg/L <1.5 (L)   Kappa, lambda light chain ratio UNABLE TO CALCULATE     Legend: (L) Low   MULTIPLE MYELOMA PANEL 02/2023 - 06/2024 AND KAPPA/LAMDA LIGHT CHAINS 02/2022 - 06/2024     RADIOGRAPHIC STUDIES: I have personally reviewed the radiological images as listed and agreed with the findings in the report. US  Abdomen Limited  RUQ (LIVER/GB) Result Date: 08/22/2024 CLINICAL DATA:  elevated liver enzymes EXAM: ULTRASOUND ABDOMEN LIMITED RIGHT UPPER QUADRANT COMPARISON:  December 25, 2021 FINDINGS: Gallbladder: No gallstones or wall thickening visualized. No sonographic Murphy sign noted by sonographer. Common bile duct: Diameter: Visualized portion measures 4 mm, within normal limits. Liver: Benign cyst is noted measuring 10 x 7 x 13 mm. Mildly heterogeneous in parenchymal echogenicity. Lobular liver contours. Portal vein is patent on color Doppler imaging with normal direction of blood flow towards the liver. Other: None. IMPRESSION: Lobular liver contours with mildly heterogeneous parenchymal echogenicity. Findings are nonspecific but can be seen in the setting of underlying hepatocellular disease. Electronically Signed   By: Corean Salter M.D.   On: 08/22/2024 07:51    ASSESSMENT & PLAN:  84 y.o. female with  1. 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 -Transitioned to PO Cytoxan /Velcade /Dex of each 21-day cycle in May 2020 -Transitioned to maintenance ixazomib therapy in December 2020. -Due to progression of disease in July 2022, started Kyprolis /Dex therapy -Due to progression of disease in August 2023, switched to Dara/Pom/Dex -Due to progression of disease in September 2024, switched to elotuzumab /velcade /dex -Due to progression of disease in October 2024, switched to teclistamab  at [facility]. Tolerated step up therapy without CRS or ICANS.  -Cycle 8, Day 1 of Teclistamab  was delayed by one week 2/2 neutropenia, started on Zarxio  M/W/F of off weeks of therapy.  -Transitioned to Zarxio  Mon/Thurs weekly starting Cycle 9 of Teclistamab .    2.  Cancer related pain  -Currently well controlled -Continue fentanyl  patch at 12 mcg/h and Percocet for break through pain.    3.  Elevated transaminases thought to be possibly related to drug-induced liver injury from her bite therapy.  EBV and CMV and hepatitis B reactivation were evaluated and ruled out. Patient is on a prednisone  taper with improving transaminases.  PLAN: - Discussed lab results on 10/11/2024 in detail with patient: - CMP   - Creatinine: 0.65 - Calcium : 8.9 - CBC  - Hemoglobin: 9.4  - WBC: 3.0  - Platelets: 274 - Multiple Myeloma Panel: pending - Kappa/Lambda Light Chains: pending - No treatment today due to DILI from Bite therapy. - Begin Prednisone  with taper to help with abnormal LFTs from grade 2 DILI from Bite therapy.  FOLLOW-UP in 2 weeks for labs and follow-up with Dr. Onesimo.  The total time spent in the appointment was 30 minutes* .  All of the patient's questions were answered and the patient knows to call the clinic with any problems, questions, or concerns.  Emaline Onesimo MD  MS AAHIVMS [facility] Hematology/Oncology Physician [facility]  *Total Encounter Time as defined by the Centers for Medicare and Medicaid Services includes, in addition to the face-to-face time of a patient visit (documented in the note above) non-face-to-face time: obtaining and reviewing outside history, ordering and reviewing medications, tests or procedures, care coordination (communications with other health care professionals or caregivers) and documentation in the medical record.  I, Marijo Sharps, acting as a neurosurgeon for Emaline Onesimo, MD.,have documented all relevant documentation on the behalf of Emaline Onesimo, MD,as directed by  Emaline Onesimo, MD while in the presence of Emaline Onesimo, MD.  I have  reviewed the above documentation for accuracy and completeness, and I agree with the above.  Emaline Saran, MD      [1]  Social History Tobacco Use   Smoking status: Former    Current packs/day: 0.00    Average packs/day: 0.5 packs/day for 7.0 years (3.5 ttl pk-yrs)    Types: Cigarettes    Start date: 01/04/1954    Quit date: 01/04/1961    Years since quitting: 63.8   Smokeless tobacco: Never  Vaping Use   Vaping status: Never Used  Substance Use Topics   Alcohol use: Not Currently    Alcohol/week: 1.0 standard drink of alcohol    Types: 1 Standard drinks or equivalent per week    Comment: occas    Drug use: No   "

## 2024-10-11 NOTE — Patient Instructions (Signed)
 [facility] - A DEPT OF [facility]  Discharge Instructions: Thank you for choosing [facility] to provide your oncology and hematology care.   If you have a lab appointment with the Cancer Center, please go directly to the Cancer Center and check in at the registration area.   Wear comfortable clothing and clothing appropriate for easy access to any Portacath or PICC line.   We strive to give you quality time with your provider. You may need to reschedule your appointment if you arrive late (15 or more minutes).  Arriving late affects you and other patients whose appointments are after yours.  Also, if you miss three or more appointments without notifying the office, you may be dismissed from the clinic at the provider's discretion.      For prescription refill requests, have your pharmacy contact our office and allow 72 hours for refills to be completed.    Today you received the following chemotherapy and/or immunotherapy agents ZOLEDRONIC  ACID (ZOMETA )       To help prevent nausea and vomiting after your treatment, we encourage you to take your nausea medication as directed.  BELOW ARE SYMPTOMS THAT SHOULD BE REPORTED IMMEDIATELY: *FEVER GREATER THAN 100.4 F (38 C) OR HIGHER *CHILLS OR SWEATING *NAUSEA AND VOMITING THAT IS NOT CONTROLLED WITH YOUR NAUSEA MEDICATION *UNUSUAL SHORTNESS OF BREATH *UNUSUAL BRUISING OR BLEEDING *URINARY PROBLEMS (pain or burning when urinating, or frequent urination) *BOWEL PROBLEMS (unusual diarrhea, constipation, pain near the anus) TENDERNESS IN MOUTH AND THROAT WITH OR WITHOUT PRESENCE OF ULCERS (sore throat, sores in mouth, or a toothache) UNUSUAL RASH, SWELLING OR PAIN  UNUSUAL VAGINAL DISCHARGE OR ITCHING   Items with * indicate a potential emergency and should be followed up as soon as possible or go to the Emergency Department if any problems should occur.  Please show the CHEMOTHERAPY ALERT CARD or  IMMUNOTHERAPY ALERT CARD at check-in to the Emergency Department and triage nurse.  Should you have questions after your visit or need to cancel or reschedule your appointment, please contact [facility] - A DEPT OF [facility]  Dept: [phone]  and follow the prompts.  Office hours are 8:00 a.m. to 4:30 p.m. Monday - Friday. Please note that voicemails left after 4:00 p.m. may not be returned until the following business day.  We are closed weekends and major holidays. You have access to a nurse at all times for urgent questions. Please call the main number to the clinic Dept: [phone] and follow the prompts.   For any non-urgent questions, you may also contact your provider using MyChart. We now offer e-Visits for anyone 40 and older to request care online for non-urgent symptoms. For details visit [email or web address].   Also download the MyChart app! Go to the app store, search MyChart, open the app, select [facility], and log in with your MyChart username and password.

## 2024-10-12 LAB — KAPPA/LAMBDA LIGHT CHAINS
Kappa free light chain: <0.7 mg/L — ABNORMAL LOW (ref 3.3–19.4)
Kappa, lambda light chain ratio: UNDETERMINED
Lambda free light chains: <1.5 mg/L — ABNORMAL LOW (ref 5.7–26.3)

## 2024-10-13 ENCOUNTER — Other Ambulatory Visit: Payer: Self-pay

## 2024-10-13 ENCOUNTER — Inpatient Hospital Stay

## 2024-10-13 VITALS — BP 132/70 | HR 69 | Temp 98.6°F | Resp 16 | Wt 103.8 lb

## 2024-10-13 DIAGNOSIS — C9000 Multiple myeloma not having achieved remission: Secondary | ICD-10-CM

## 2024-10-13 DIAGNOSIS — Z5111 Encounter for antineoplastic chemotherapy: Secondary | ICD-10-CM | POA: Diagnosis not present

## 2024-10-13 DIAGNOSIS — Z7189 Other specified counseling: Secondary | ICD-10-CM

## 2024-10-13 DIAGNOSIS — Z95828 Presence of other vascular implants and grafts: Secondary | ICD-10-CM

## 2024-10-13 LAB — MULTIPLE MYELOMA PANEL, SERUM
Albumin SerPl Elph-Mcnc: 3.5 g/dL (ref 2.9–4.4)
Albumin/Glob SerPl: 2.1 — ABNORMAL HIGH (ref 0.7–1.7)
Alpha 1: 0.3 g/dL (ref 0.0–0.4)
Alpha2 Glob SerPl Elph-Mcnc: 0.5 g/dL (ref 0.4–1.0)
B-Globulin SerPl Elph-Mcnc: 0.7 g/dL (ref 0.7–1.3)
Gamma Glob SerPl Elph-Mcnc: 0.3 g/dL — ABNORMAL LOW (ref 0.4–1.8)
Globulin, Total: 1.7 g/dL — ABNORMAL LOW (ref 2.2–3.9)
IgA: <5 mg/dL — ABNORMAL LOW (ref 64–422)
IgG (Immunoglobin G), Serum: 227 mg/dL — ABNORMAL LOW (ref 586–1602)
IgM (Immunoglobulin M), Srm: <5 mg/dL — ABNORMAL LOW (ref 26–217)
Total Protein ELP: 5.2 g/dL — ABNORMAL LOW (ref 6.0–8.5)

## 2024-10-13 MED ORDER — IMMUNE GLOBULIN (HUMAN) 10 GM/100ML IV SOLN
400.0000 mg/kg | INTRAVENOUS | Status: DC
  Administered 2024-10-13: 20 g via INTRAVENOUS
  Filled 2024-10-13: qty 200

## 2024-10-13 MED ORDER — LORATADINE 10 MG PO TABS
10.0000 mg | ORAL_TABLET | Freq: Once | ORAL | Status: AC
  Administered 2024-10-13: 10 mg via ORAL
  Filled 2024-10-13: qty 1

## 2024-10-13 MED ORDER — DEXTROSE 5 % IV SOLN: INTRAVENOUS | Status: DC

## 2024-10-13 MED ORDER — FENTANYL 12 MCG/HR TD PT72: 1.0000 | MEDICATED_PATCH | TRANSDERMAL | 0 refills | Status: AC

## 2024-10-13 MED ORDER — ACETAMINOPHEN 325 MG PO TABS
650.0000 mg | ORAL_TABLET | Freq: Once | ORAL | Status: AC
  Administered 2024-10-13: 650 mg via ORAL
  Filled 2024-10-13: qty 2

## 2024-10-13 NOTE — Patient Instructions (Signed)
 Immune Globulin  Injection What is this medication? IMMUNE GLOBULIN  (im MUNE GLOB yoo lin) treats many immune system conditions. It works by Designer, multimedia extra antibodies. Antibodies are proteins made by the immune system that help protect the body. This medicine may be used for other purposes; ask your health care provider or pharmacist if you have questions. COMMON BRAND NAME(S): ASCENIV, Baygam, BIVIGAM, Carimune, Carimune NF, cutaquig, Cuvitru, Flebogamma, Flebogamma DIF, GamaSTAN, GamaSTAN S/D, Gamimune N, Gammagard, Gammagard S/D, Gammaked, Gammaplex, Gammar-P IV, Gamunex, Gamunex-C, Hizentra, Iveegam, Iveegam EN, Octagam, Panglobulin, Panglobulin NF, panzyga, Polygam S/D, Privigen , Sandoglobulin, Venoglobulin-S, Vigam, Vivaglobulin, Xembify What should I tell my care team before I take this medication? They need to know if you have any of these conditions: Blood clotting disorder Condition where you have excess fluid in your body, such as heart failure or edema Dehydration Diabetes Have had blood clots Heart disease Immune system conditions Kidney disease Low levels of IgA Recent or upcoming vaccine An unusual or allergic reaction to immune globulin , other medications, foods, dyes, or preservatives Pregnant or trying to get pregnant Breastfeeding How should I use this medication? This medication is infused into a vein or under the skin. It may also be injected into a muscle. It is usually given by your care team in a hospital or clinic setting. It may also be given at home. If you get this medication at home, you will be taught how to prepare and give it. Take it as directed on the prescription label. Keep taking it unless your care team tells you to stop. It is important that you put your used needles and syringes in a special sharps container. Do not put them in a trash can. If you do not have a sharps container, call your pharmacist or care team to get one. Talk to your care team  about the use of this medication in children. While it may be given to children for selected conditions, precautions do apply. Overdosage: If you think you have taken too much of this medicine contact a poison control center or emergency room at once. NOTE: This medicine is only for you. Do not share this medicine with others. What if I miss a dose? If you get this medication at the hospital or clinic: It is important not to miss your dose. Call your care team if you are unable to keep an appointment. If you give yourself this medication at home: If you miss a dose, take it as soon as you can. Then continue your normal schedule. If it is almost time for your next dose, take only that dose. Do not take double or extra doses. Call your care team with questions. What may interact with this medication? Live virus vaccines This list may not describe all possible interactions. Give your health care provider a list of all the medicines, herbs, non-prescription drugs, or dietary supplements you use. Also tell them if you smoke, drink alcohol , or use illegal drugs. Some items may interact with your medicine. What should I watch for while using this medication? Your condition will be monitored carefully while you are receiving this medication. Tell your care team if your symptoms do not start to get better or if they get worse. You may need blood work done while you are taking this medication. This medication increases the risk of blood clots. People with heart, blood vessel, or blood clotting conditions are more likely to develop a blood clot. Other risk factors include advanced age, estrogen use, tobacco  use, lack of movement, and being overweight. This medication can decrease the response to a vaccine. If you need to get vaccinated, tell your care team if you have received this medication within the last year. Extra booster doses may be needed. Talk to your care team to see if a different vaccination schedule  is needed. This medication is made from donated human blood. There is a small risk it may contain bacteria or viruses, such as hepatitis or HIV. All products are processed to kill most bacteria and viruses. Talk to your care team if you have questions about the risk of infection. If you have diabetes, talk to your care team about which device you should use to check your blood sugar. This medication may cause some devices to report falsely high blood sugar levels. This may cause you to react by not treating a low blood sugar level or by giving an insulin  dose that was not needed. This can cause severe low blood sugar levels. What side effects may I notice from receiving this medication? Side effects that you should report to your care team as soon as possible: Allergic reactions--skin rash, itching, hives, swelling of the face, lips, tongue, or throat Blood clot--pain, swelling, or warmth in the leg, shortness of breath, chest pain Fever, neck pain or stiffness, sensitivity to light, headache, nausea, vomiting, confusion, which may be signs of meningitis Hemolytic anemia--unusual weakness or fatigue, dizziness, headache, trouble breathing, dark urine, yellowing skin or eyes Kidney injury--decrease in the amount of urine, swelling of the ankles, hands, or feet Low sodium level--muscle weakness, fatigue, dizziness, headache, confusion Shortness of breath or trouble breathing, cough, unusual weakness or fatigue, blue skin or lips Side effects that usually do not require medical attention (report these to your care team if they continue or are bothersome): Chills Diarrhea Fever Headache Nausea This list may not describe all possible side effects. Call your doctor for medical advice about side effects. You may report side effects to FDA at 1-800-FDA-1088. Where should I keep my medication? Keep out of the reach of children and pets. You will be instructed on how to store this medication. Get rid of  any unused medication after the expiration date. To get rid of medications that are no longer needed or have expired: Take the medication to a medication take-back program. Check with your pharmacy or law enforcement to find a location. If you cannot return the medication, ask your pharmacist or care team how to get rid of this medication safely. NOTE: This sheet is a summary. It may not cover all possible information. If you have questions about this medicine, talk to your doctor, pharmacist, or health care provider.  2025 Elsevier/Gold Standard (2023-12-06 00:00:00)

## 2024-10-18 ENCOUNTER — Encounter: Payer: Self-pay | Admitting: Hematology

## 2024-10-25 ENCOUNTER — Inpatient Hospital Stay

## 2024-10-25 ENCOUNTER — Inpatient Hospital Stay: Admitting: Hematology

## 2024-10-25 DIAGNOSIS — D801 Nonfamilial hypogammaglobulinemia: Secondary | ICD-10-CM

## 2024-10-25 DIAGNOSIS — Z5111 Encounter for antineoplastic chemotherapy: Secondary | ICD-10-CM | POA: Diagnosis not present

## 2024-10-25 DIAGNOSIS — C9002 Multiple myeloma in relapse: Secondary | ICD-10-CM

## 2024-10-25 DIAGNOSIS — R748 Abnormal levels of other serum enzymes: Secondary | ICD-10-CM

## 2024-10-25 DIAGNOSIS — Z7189 Other specified counseling: Secondary | ICD-10-CM

## 2024-10-25 LAB — CBC WITH DIFFERENTIAL (CANCER CENTER ONLY)
Abs Immature Granulocytes: 0.09 K/uL — ABNORMAL HIGH (ref 0.00–0.07)
Basophils Absolute: 0.1 K/uL (ref 0.0–0.1)
Basophils Relative: 1 %
Eosinophils Absolute: 0 K/uL (ref 0.0–0.5)
Eosinophils Relative: 1 %
HCT: 32.4 % — ABNORMAL LOW (ref 36.0–46.0)
Hemoglobin: 10.8 g/dL — ABNORMAL LOW (ref 12.0–15.0)
Immature Granulocytes: 2 %
Lymphocytes Relative: 4 %
Lymphs Abs: 0.3 K/uL — ABNORMAL LOW (ref 0.7–4.0)
MCH: 37.4 pg — ABNORMAL HIGH (ref 26.0–34.0)
MCHC: 33.3 g/dL (ref 30.0–36.0)
MCV: 112.1 fL — ABNORMAL HIGH (ref 80.0–100.0)
Monocytes Absolute: 0.7 K/uL (ref 0.1–1.0)
Monocytes Relative: 12 %
Neutro Abs: 5 K/uL (ref 1.7–7.7)
Neutrophils Relative %: 80 %
Platelet Count: 202 K/uL (ref 150–400)
RBC: 2.89 MIL/uL — ABNORMAL LOW (ref 3.87–5.11)
RDW: 15.2 % (ref 11.5–15.5)
WBC Count: 6.2 K/uL (ref 4.0–10.5)
nRBC: 0 % (ref 0.0–0.2)

## 2024-10-25 LAB — CMP (CANCER CENTER ONLY)
ALT: 445 U/L (ref 0–44)
AST: 162 U/L — ABNORMAL HIGH (ref 15–41)
Albumin: 4.4 g/dL (ref 3.5–5.0)
Alkaline Phosphatase: 39 U/L (ref 38–126)
Anion gap: 11 (ref 5–15)
BUN: 21 mg/dL (ref 8–23)
CO2: 26 mmol/L (ref 22–32)
Calcium: 9.2 mg/dL (ref 8.9–10.3)
Chloride: 101 mmol/L (ref 98–111)
Creatinine: 0.75 mg/dL (ref 0.44–1.00)
GFR, Estimated: >60 mL/min
Glucose, Bld: 115 mg/dL — ABNORMAL HIGH (ref 70–99)
Potassium: 4.8 mmol/L (ref 3.5–5.1)
Sodium: 138 mmol/L (ref 135–145)
Total Bilirubin: 0.7 mg/dL (ref 0.0–1.2)
Total Protein: 6.3 g/dL — ABNORMAL LOW (ref 6.5–8.1)

## 2024-10-25 MED ORDER — PREDNISONE 20 MG PO TABS: ORAL_TABLET | ORAL | 0 refills | Status: AC

## 2024-10-25 NOTE — Progress Notes (Signed)
 " HEMATOLOGY ONCOLOGY PROGRESS NOTE  Date of service: 10/25/2024  Patient Care Team: Katrinka Garnette KIDD, MD as PCP - General (Family Medicine) Onesimo Emaline Brink, MD as Consulting Physician (Hematology) Bond, Reyes Mcardle, MD as Referring Physician (Ophthalmology) Evern Nian, Rogue, MD as Consulting Physician (Hematology and Oncology) Trixie File, MD as Consulting Physician (Internal Medicine) Eletha Boas, MD as Consulting Physician (General Surgery) Nicholaus Sherlean CROME, [facility] (Inactive) as Pharmacist (Pharmacist)  CHIEF COMPLAINT/PURPOSE OF CONSULTATION: Follow-up for continued evaluation and management of Multiple Myeloma  HISTORY OF PRESENTING ILLNESS: (12/12/2018) Megan Olson is a wonderful 84 y.o. female who has been referred to us  by Dr. 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 She is accompanied today by her husband. The pt reports that she is doing well overall.    The pt notes that she was doing extensive yard work in October 2019, and began to feel back pain a few days after this, which she attributed to muscle pain. She recalls taking a deep breath, and developing sudden acute pain, and presented to care with her PCP's office. She began exercises for her back pain, without relief, then began PT without relief, then was referred to Dr. Marquette in sports medicine in late January 2020. She had an XR which revealed a compression fracture at T11, then subsequently had an MRI, and a bone marrow biopsy. The pt notes that her back pain seemed to move around. She endorses pain up the whole left side of her back.   The pt notes that she is not able to stand up straight due to her back pain, which she feels limits her ability to take a deep breath, and endorses pain exacerbation when she does take a deep breath. The pt needs assistance from sitting to lying from her husband. She is using about 3 Percocet a day.   The pt  notes worsened constipation since beginning Percocet, and notes that her most recent laxative order was not covered by her insurance. She is using prune juice and milk of magnesia. She took Senokot S BID without relief.   The pt reports that prior to her recent back pain, she had very few medical concerns. She endorses controlled BP, and has been monitored for DM but after closely watching her diet her A1C decreased to 5.8. She has glaucoma, and has had surgery in both eyes. Right eye with stent. The pt sees Dr. Geneva at [facility] for her eye care. The pt notes that her vision has been recently pretty good. The pt notes that she has been advised to limit treatment with steroids for her glaucoma. She denies heart or lung problems. Denies previous back problems. Last DEXA scan 3 years ago, and endorses osteopenia. She notes that she took Fosamax for 3-4 years, and stopped 5-6 years ago. She takes Vitamin D , a multivitamin, and magnesium .   The pt notes that she quit smoking cigarettes when she was 27, started when she was about 20. The pt consumes alcohol rarely, but not since beginning narcotics. 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   Of note prior to the patient's visit today, pt has had an MRI Thoracic Spine completed on 11/27/18 with results revealing Multifocal marrow signal abnormality consistent with metastatic disease or multiple myeloma. The patient has multiple compression fractures most consistent with pathologic injuries. Extensive marrow signal abnormality makes determining age of the fractures difficult but edema is most intense in T3. Epidural tumor centrally and to  the left posterior to T3 extends into the left neural foramen and could impact the left T3 root.   Most recent lab results (12/01/18) of CBC w/diff is as follows: all values are WNL except for RBC at 3.17, HGB at 10.4, HCT at 33.5, MCV at 105.7. 11/28/18 CMP revealed all values WNL except for Glucose at 105, Total Protein  at 10.2, Albumin  at 3.1 11/30/18 24HR UPEP revealed all values WNL except for M-spike at 75.1%. 11/28/18 Bega-2 microglobulin slightly elevated at 2.6 11/28/18 SPEP revealed M spike at 4.6g 11/28/18 Immunoglobulins revealed IgG at 6181, IgA at 24, IgM at 16, and IgE at 6.   On review of systems, pt reports constipation, back pain, stable energy levels, ankle swelling, tenderness at T3, lower back pain, and denies abdominal pains, neck pain, and any other symptoms.    On PMHx the pt reports redundant colon, single hemorrhoid, glaucoma, HTN, osteopenia, Tonsillectomy, right eye stent and multiple eye surgeries. On Social Hx the pt reports rare alcohol use, smoked cigarettes between ages 89 and 53. Formerly worked in furniture conservator/restorer. On Family Hx the pt reports sister died from small cell lung cancer and was a lifelong smoker, brother's daughter died of breast cancer with BRCA1 and BRCA2 mutations. Cousin with female breast cancer.   SUMMARY OF ONCOLOGIC HISTORY:  Oncology History  Multiple myeloma not having achieved remission (HCC)  12/12/2018 Initial Diagnosis   Multiple myeloma not having achieved remission (HCC)   12/20/2018 - 10/04/2019 Chemotherapy   The patient had dexamethasone  (DECADRON ) tablet 20 mg, 20 mg (100 % of original dose 20 mg), Oral, Once, 14 of 14 cycles Dose modification: 20 mg (original dose 20 mg, Cycle 1), 10 mg (original dose 20 mg, Cycle 14) Administration: 20 mg (12/20/2018), 20 mg (12/27/2018), 20 mg (01/10/2019), 20 mg (01/17/2019), 20 mg (01/31/2019), 20 mg (02/07/2019), 20 mg (03/14/2019), 20 mg (03/21/2019), 20 mg (02/21/2019), 20 mg (02/28/2019), 20 mg (04/04/2019), 20 mg (04/11/2019), 20 mg (04/25/2019), 20 mg (05/02/2019), 20 mg (05/16/2019), 20 mg (05/23/2019), 20 mg (06/06/2019), 20 mg (06/13/2019), 20 mg (06/27/2019), 20 mg (07/04/2019), 20 mg (07/18/2019), 20 mg (07/25/2019), 20 mg (08/08/2019), 20 mg (08/15/2019), 20 mg (08/29/2019), 20 mg (09/05/2019), 10 mg (09/19/2019) lenalidomide   (REVLIMID ) 15 MG capsule, 1 of 1 cycle, Start date: 01/18/2019, End date: 02/21/2019 bortezomib  SQ (VELCADE ) chemo injection 2 mg, 1.3 mg/m2 = 2 mg, Subcutaneous,  Once, 14 of 14 cycles Administration: 2 mg (12/20/2018), 2 mg (12/27/2018), 2 mg (12/23/2018), 2 mg (12/30/2018), 2 mg (01/10/2019), 2 mg (01/17/2019), 2 mg (01/13/2019), 2 mg (01/20/2019), 2 mg (01/31/2019), 2 mg (02/07/2019), 2 mg (02/03/2019), 2 mg (02/10/2019), 2 mg (03/14/2019), 2 mg (03/17/2019), 2 mg (03/21/2019), 2 mg (03/24/2019), 2 mg (02/21/2019), 2 mg (02/24/2019), 2 mg (02/28/2019), 2 mg (03/03/2019), 2 mg (04/04/2019), 2 mg (04/06/2019), 2 mg (04/11/2019), 2 mg (04/14/2019), 2 mg (04/25/2019), 2 mg (04/28/2019), 2 mg (05/02/2019), 2 mg (05/05/2019), 2 mg (05/16/2019), 2 mg (05/19/2019), 2 mg (05/23/2019), 2 mg (05/26/2019), 2 mg (06/06/2019), 2 mg (06/09/2019), 2 mg (06/13/2019), 2 mg (06/16/2019), 2 mg (06/27/2019), 2 mg (06/30/2019), 2 mg (07/04/2019), 2 mg (07/07/2019), 2 mg (07/18/2019), 2 mg (07/21/2019), 2 mg (07/25/2019), 2 mg (07/28/2019), 2 mg (08/08/2019), 2 mg (08/11/2019), 2 mg (08/15/2019), 2 mg (08/18/2019), 2 mg (08/29/2019), 2 mg (09/01/2019), 2 mg (09/05/2019), 2 mg (09/08/2019), 2 mg (09/19/2019), 2 mg (10/04/2019)  for chemotherapy treatment.    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)  04/16/2021 Initial Diagnosis   Multiple myeloma in  relapse (HCC)   04/30/2021 - 04/15/2022 Chemotherapy   Patient is on Treatment Plan : MYELOMA RELAPSED/REFRACTORY KCd q28d     05/12/2022 - 07/08/2022 Chemotherapy   Patient is on Treatment Plan : MYELOMA Daratumumab  + Pomalidomide  + Dexamethasone  q28d x 7 cycles     05/22/2022 - 05/18/2023 Chemotherapy   Patient is on Treatment Plan : MYELOMA Daratumumab  IV + Pomalidomide  + Dexamethasone  q28d x 7 cycles     03/15/2024 -  Chemotherapy   Patient is on Treatment Plan : MYELOMA Maintenance Teclistamab -cqyv SQ, D1,15 q28d       INTERVAL HISTORY: Megan Olson is a 84 y.o. female who is here today for continued evaluation and management of  Multiple Myeloma. she was last seen by me on 10/11/2024; at the time she mentioned experiencing a productive cough and dry eyes.   Today, she says that she's been having some dry and flaky skin, wanting to know if Urea cream would be okay to use for this.   She reports she is tolerating her Zarxio .  She does endorses some mild pain with a deep breath occasionally or yawning, but no more shortness of breath.   REVIEW OF SYSTEMS:   10 Point review of systems of done and is negative except as noted above.  MEDICAL HISTORY Past Medical History:  Diagnosis Date   Anemia    Blood transfusion without reported diagnosis Yrs ago when going through menopause   Cataract    Glaucoma    HYPERTENSION 03/11/2007   Multiple myeloma (HCC)    OSTEOPENIA 03/11/2007   Pre-diabetes    Thyroid  cancer (HCC)    Thyroid  disease    Tubular adenoma of colon 07/2015    IMMUNIZATION HISTORY Immunization History  Administered Date(s) Administered   Fluad Quad(high Dose 65+) 06/13/2019, 07/03/2020, 06/17/2021, 06/25/2022   INFLUENZA, HIGH DOSE SEASONAL PF 07/22/2016, 06/24/2017, 06/23/2018, 08/10/2022   Influenza Split 07/19/2012   Influenza Whole 07/17/2008, 06/28/2009, 06/25/2010   Influenza,inj,Quad PF,6+ Mos 06/27/2013, 06/18/2014   Influenza-Unspecified 07/03/2015   PFIZER Comirnaty(Gray Top)Covid-19 Tri-Sucrose Vaccine 06/13/2023   PFIZER(Purple Top)SARS-COV-2 Vaccination 11/09/2019, 12/04/2019, 06/03/2020, 01/09/2021, 07/04/2021, 07/03/2022   PNEUMOCOCCAL CONJUGATE-20 05/21/2021   Pneumococcal Conjugate-13 03/29/2015   Pneumococcal Polysaccharide-23 04/04/2006   Respiratory Syncytial Virus Vaccine,Recomb Aduvanted(Arexvy) 08/10/2022   Td 02/10/2010   Tdap 04/23/2023   Zoster Recombinant(Shingrix) 03/27/2020, 09/06/2020   Zoster, Live 09/26/2008    SURGICAL HISTORY Past Surgical History:  Procedure Laterality Date   BONE MARROW BIOPSY     multiple   BREAST EXCISIONAL BIOPSY Right 2000    BREAST LUMPECTOMY  1990   benign   CATARACT EXTRACTION Bilateral 2018   DILATION AND CURETTAGE OF UTERUS     bleeding at menopause. No uterine cancer   EYE SURGERY  2017, 2018. 2019   IR IMAGING GUIDED PORT INSERTION  05/16/2021   IR RADIOLOGIST EVAL & MGMT  12/13/2018   THYROIDECTOMY N/A 08/02/2020   Procedure: TOTAL THYROIDECTOMY;  Surgeon: Eletha Boas, MD;  Location: [facility];  Service: General;  Laterality: N/A;   TONSILLECTOMY     age 82    SOCIAL HISTORY Social History[1]  Social History   Social History Narrative   Married. Lives with husband (patient of Dr. Katrinka). 1 son. No grandkids. 1 granddog.       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, swing dancing, dinner, read       SOCIAL DRIVERS  OF HEALTH SDOH Screenings   Food Insecurity: No Food Insecurity (06/08/2024)  Housing: Unknown (06/08/2024)  Transportation Needs: Unknown (06/08/2024)  Utilities: Not At Risk (02/21/2024)  Alcohol Screen: Low Risk (06/08/2024)  Depression (PHQ2-9): Low Risk (10/11/2024)  Financial Resource Strain: Low Risk (06/08/2024)  Physical Activity: Insufficiently Active (06/08/2024)  Social Connections: Socially Integrated (06/08/2024)  Stress: No Stress Concern Present (06/08/2024)  Tobacco Use: Medium Risk (09/04/2024)   Received from [facility]  Health Literacy: Adequate Health Literacy (02/21/2024)     FAMILY HISTORY Family History  Problem Relation Age of Onset   Heart disease Mother        CHF mother died 66   Arthritis Mother    Glaucoma Mother        sister as well   Alcohol abuse Father    Suicidality Father    Heart disease Sister        aortic valve replacement   Lung cancer Sister        smoker   Cancer Sister    Hyperlipidemia Brother    Hypertension Brother    COPD Brother    Colon polyps Brother    Arthritis Sister    Hypertension Sister    Glaucoma Sister    Hashimoto's thyroiditis  Sister    Colon polyps Sister    Diabetes Sister    Hypertension Son    Stroke Maternal Grandmother    Cystic fibrosis Niece    Colon cancer Neg Hx      ALLERGIES: is allergic to ace inhibitors, benadryl [diphenhydramine], diamox [acetazolamide], sulfamethoxazole, lenalidomide , and penicillins.  MEDICATIONS  Current Outpatient Medications  Medication Sig Dispense Refill   acyclovir  (ZOVIRAX ) 400 MG tablet Take 1 tablet (400 mg total) by mouth 2 (two) times daily. 60 tablet 3   ALPHAGAN  P 0.1 % SOLN Place 1 drop into both eyes in the morning, at noon, and at bedtime.      amLODipine  (NORVASC ) 10 MG tablet Take 1 tablet (10 mg total) by mouth daily. 90 tablet 3   aspirin EC 81 MG tablet Take 81 mg by mouth daily.     azithromycin  (ZITHROMAX ) 250 MG tablet Take 2 tablets on day 1 and then 1 tablet on on days 2-5. (Patient not taking: Reported on 09/13/2024) 6 each 0   b complex vitamins capsule Take 1 capsule by mouth daily.     Calcium  Carb-Cholecalciferol (CALCIUM  600 + D PO) Take by mouth.     Cholecalciferol (VITAMIN D3) 125 MCG (5000 UT) TABS Take 1,000 Units by mouth daily.     Co-Enzyme Q-10 100 MG CAPS Take 100 mg by mouth daily.     cyanocobalamin  (VITAMIN B12) 500 MCG tablet Take 500 mcg by mouth every other day.     dapsone  100 MG tablet Take 1 tablet (100 mg total) by mouth daily. 30 tablet 3   dorzolamide -timolol  (COSOPT ) 22.3-6.8 MG/ML ophthalmic solution Place 1 drop into both eyes 2 (two) times daily.   11   fentaNYL  (DURAGESIC ) 12 MCG/HR Place 1 patch onto the skin every 3 (three) days. 10 patch 0   filgrastim -sndz (ZARXIO ) 300 MCG/0.5ML SOSY injection Inject 0.5 mLs (300 mcg total) into the skin 2 (two) times a week. On Monday and thursday (Patient not taking: Reported on 09/13/2024) 4 mL 2   gabapentin  (NEURONTIN ) 100 MG capsule Take 2 capsules (200 mg total) by mouth at bedtime. 60 capsule 0   levothyroxine  (SYNTHROID ) 88 MCG tablet Take 1 tablet (88 mcg total) by  mouth  daily before breakfast. 90 tablet 3   lidocaine -prilocaine  (EMLA ) cream Apply to affected area once 30 g 3   magnesium  chloride (SLOW-MAG) 64 MG TBEC SR tablet Take by mouth.     Multiple Vitamins-Minerals (MULTIVITAMIN WITH MINERALS) tablet Take 1 tablet by mouth daily. Centrum Silver 50 +     Netarsudil-Latanoprost  (ROCKLATAN) 0.02-0.005 % SOLN Place 1 drop into both eyes daily.     Omega-3 1000 MG CAPS Take 1,000 mg by mouth daily.      ondansetron  (ZOFRAN ) 8 MG tablet Take 1 tablet (8 mg total) by mouth every 8 (eight) hours as needed for nausea or vomiting. 30 tablet 1   oxyCODONE -acetaminophen  (PERCOCET) 5-325 MG tablet Take 1-2 tablets by mouth every 4 (four) hours as needed for moderate pain or severe pain. 90 tablet 0   polyethylene glycol (MIRALAX ) packet Take 17 g by mouth daily. 30 each 1   predniSONE  (DELTASONE ) 20 MG tablet Take 3 tablets (60 mg total) by mouth daily with breakfast for 5 days, THEN 2 tablets (40 mg total) daily with breakfast for 5 days, THEN 1 tablet (20 mg total) daily with breakfast for 20 days. 45 tablet 0   prochlorperazine  (COMPAZINE ) 10 MG tablet Take 1 tablet (10 mg total) by mouth every 6 (six) hours as needed for nausea or vomiting. 30 tablet 1   senna-docusate (EQ STOOL SOFTENER/LAXATIVE) 8.6-50 MG tablet Take 1 tablet by mouth at bedtime. 60 tablet 2   Simethicone  (GAS-X PO) Take 1 tablet by mouth as needed.     teclistamab -cqyv SQ (TECVAYLI ) 153 MG/1.7ML Inject 1.5 mg/kg into the skin once.     vitamin C (ASCORBIC ACID) 500 MG tablet Take 500 mg by mouth 3 (three) times a week.     No current facility-administered medications for this visit.    PHYSICAL EXAMINATION: ECOG PERFORMANCE STATUS: 1 - Symptomatic but completely ambulatory VITALS: There were no vitals filed for this visit. There were no vitals filed for this visit. There is no height or weight on file to calculate BMI.  GENERAL: alert, in no acute distress and comfortable SKIN: no acute  rashes, no significant lesions EYES: conjunctiva are pink and non-injected, sclera anicteric OROPHARYNX: MMM, no exudates, no oropharyngeal erythema or ulceration NECK: supple, no JVD LYMPH:  no palpable lymphadenopathy in the cervical, axillary or inguinal regions LUNGS: clear to auscultation b/l with normal respiratory effort HEART: regular rate & rhythm ABDOMEN:  normoactive bowel sounds , non tender, not distended, no hepatosplenomegaly Extremity: no pedal edema PSYCH: alert & oriented x 3 with fluent speech NEURO: no focal motor/sensory deficits  LABORATORY DATA:   I have reviewed the data as listed     Latest Ref Rng & Units 10/25/2024    8:21 AM 10/11/2024    1:52 PM 09/29/2024    9:49 AM  CBC EXTENDED  WBC 4.0 - 10.5 K/uL 6.2  3.0  2.9   RBC 3.87 - 5.11 MIL/uL 2.89  2.47  2.53   Hemoglobin 12.0 - 15.0 g/dL 89.1  9.4  9.8   HCT 63.9 - 46.0 % 32.4  28.7  29.4   Platelets 150 - 400 K/uL 202  274  219   NEUT# 1.7 - 7.7 K/uL 5.0  2.0  2.1   Lymph# 0.7 - 4.0 K/uL 0.3  0.3  0.2       Latest Ref Rng & Units 10/11/2024    1:52 PM 09/29/2024    9:49 AM 09/13/2024   10:39  AM  CMP  Glucose 70 - 99 mg/dL 863  889  850   BUN 8 - 23 mg/dL 18  23  18    Creatinine 0.44 - 1.00 mg/dL 9.34  9.32  9.29   Sodium 135 - 145 mmol/L 139  139  140   Potassium 3.5 - 5.1 mmol/L 4.2  4.5  4.2   Chloride 98 - 111 mmol/L 105  106  106   CO2 22 - 32 mmol/L 26  25  26    Calcium  8.9 - 10.3 mg/dL 8.9  8.6  8.8   Total Protein 6.5 - 8.1 g/dL 5.9  5.8  6.0   Total Bilirubin 0.0 - 1.2 mg/dL 0.5  0.9  0.8   Alkaline Phos 38 - 126 U/L 41  38  40   AST 15 - 41 U/L 81  60  46   ALT 0 - 44 U/L 201  151  117    MULTIPLE MYELOMA & KAPPA/LAMBDA LIGHT CHAINS 7-05/2024 - 10/11/2024   All immunoglobulins appear decreased. Pattern suggestive of hypogammaglobulinemia.      RADIOGRAPHIC STUDIES: I have personally reviewed the radiological images as listed and agreed with the findings in the report. US  Abdomen  Limited RUQ (LIVER/GB) Result Date: 08/22/2024 CLINICAL DATA:  elevated liver enzymes EXAM: ULTRASOUND ABDOMEN LIMITED RIGHT UPPER QUADRANT COMPARISON:  December 25, 2021 FINDINGS: Gallbladder: No gallstones or wall thickening visualized. No sonographic Murphy sign noted by sonographer. Common bile duct: Diameter: Visualized portion measures 4 mm, within normal limits. Liver: Benign cyst is noted measuring 10 x 7 x 13 mm. Mildly heterogeneous in parenchymal echogenicity. Lobular liver contours. Portal vein is patent on color Doppler imaging with normal direction of blood flow towards the liver. Other: None. IMPRESSION: Lobular liver contours with mildly heterogeneous parenchymal echogenicity. Findings are nonspecific but can be seen in the setting of underlying hepatocellular disease. Electronically Signed   By: Corean Salter M.D.   On: 08/22/2024 07:51    ASSESSMENT & PLAN:  84 y.o. female with  1. 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 -Transitioned to PO Cytoxan /Velcade /Dex of each 21-day cycle in May 2020 -Transitioned to maintenance ixazomib therapy in December 2020. -Due to progression of disease in July 2022, started Kyprolis /Dex therapy -Due to progression of disease in August 2023, switched to Dara/Pom/Dex -Due to progression of disease in September 2024, switched to elotuzumab /velcade /dex -Due to progression of disease in October 2024, switched to teclistamab  at [facility]. Tolerated step up therapy without CRS or ICANS.  -Cycle 8, Day 1 of Teclistamab  was delayed by one week 2/2 neutropenia, started on Zarxio  M/W/F of off weeks of therapy.  -Transitioned to Zarxio  Mon/Thurs weekly  starting Cycle 9 of Teclistamab .    2.  Cancer related pain  -Currently well controlled -Continue fentanyl  patch at 12 mcg/h and Percocet for break through pain.    3.  Elevated Transaminases thought to be possibly related to drug-induced liver injury from her bite therapy.  EBV and CMV and hepatitis B reactivation were evaluated and ruled out.   PLAN: - Discussed lab results on 10/25/2024 in detail with patient: CBC showed WBC of 6.2K increased from 3.0K, Hemoglobin of 10.8 increased from 9.4, and PLTs of 202K.  CMP with Total Protein 5.9 decreased from 5.8, AST 81 increased from 60, and ALT 201, increased from 151.  Transaminase increasing over time.  Explained that elevations could be due to Cytokines, infective processes (such as EBV and CMV and hepatitis B reactivation, which were evaluated and ruled out already). With these ruled out, hers seems to be suggestive of an immune process. This is unfortunate if indeed this is immune-related hepatotoxicity as BiTE therapy has been extremely effective for her. Impression from 08/22/2025 Liver/GB US : Lobular liver contours with mildly heterogeneous parenchymal echogenicity. Findings are nonspecific but can be seen in the setting of underlying hepatocellular disease. M protein undetectable and Kappa/Lambda Lights Chains stable. IFE pattern suggestive of hypogammaglobulinemia.  Myeloma remains in remission.  Continue IVIG as scheduled. Hold Dapsone  and repeat labs in 2 weeks Start hiatus from Teclistamab  (Tecvayli ).   Pain with breathing likely musculoskeletal, and should improve with rest.  Urea cream safe for use for dry, flaking skin.   FOLLOW-UP Teclistamab  treatment on hold for now Continue IVIGq4 weeks with labs x 4 MD visit in 2 weeks with next dose of IVIG  The total time spent in the appointment was *** minutes* .  All of the patient's questions were answered and the patient knows to call the clinic with any problems, questions,  or concerns.  Emaline Saran MD MS AAHIVMS [facility] Hematology/Oncology Physician [facility]  *Total Encounter Time as defined by the Centers for Medicare and Medicaid Services includes, in addition to the face-to-face time of a patient visit (documented in the note above) non-face-to-face time: obtaining and reviewing outside history, ordering and reviewing medications, tests or procedures, care coordination (communications with other health care professionals or caregivers) and documentation in the medical record.  I,Emily Lagle,acting as a neurosurgeon for Emaline Saran, MD.,have documented all relevant documentation on the behalf of Emaline Saran, MD,as directed by  Emaline Saran, MD while in the presence of Emaline Saran, MD.  I have reviewed the above documentation for accuracy and completeness, and I agree with the above.  Emaline Saran, MD      [1]  Social History Tobacco Use   Smoking status: Former    Current packs/day: 0.00    Average packs/day: 0.5 packs/day for 7.0 years (3.5 ttl pk-yrs)    Types: Cigarettes    Start date: 01/04/1954    Quit date: 01/04/1961    Years since quitting: 63.8   Smokeless tobacco: Never  Vaping Use   Vaping status: Never Used  Substance Use Topics   Alcohol use: Not Currently    Alcohol/week: 1.0 standard drink of alcohol    Types: 1 Standard drinks or equivalent per week    Comment: occas    Drug use: No   "

## 2024-10-25 NOTE — Progress Notes (Signed)
 Pt's ALT 162 and AST 445 today, per Dr. Onesimo NO Tx today.

## 2024-10-26 LAB — KAPPA/LAMBDA LIGHT CHAINS
Kappa free light chain: <0.7 mg/L — ABNORMAL LOW (ref 3.3–19.4)
Kappa, lambda light chain ratio: UNDETERMINED
Lambda free light chains: <1.5 mg/L — ABNORMAL LOW (ref 5.7–26.3)

## 2024-10-26 MED ORDER — TERBINAFINE HCL 1 % EX CREA: 1.0000 | TOPICAL_CREAM | Freq: Two times a day (BID) | CUTANEOUS | 1 refills | Status: AC

## 2024-10-27 LAB — MULTIPLE MYELOMA PANEL, SERUM
Albumin SerPl Elph-Mcnc: 3.7 g/dL (ref 2.9–4.4)
Albumin/Glob SerPl: 1.7 (ref 0.7–1.7)
Alpha 1: 0.3 g/dL (ref 0.0–0.4)
Alpha2 Glob SerPl Elph-Mcnc: 0.5 g/dL (ref 0.4–1.0)
B-Globulin SerPl Elph-Mcnc: 0.9 g/dL (ref 0.7–1.3)
Gamma Glob SerPl Elph-Mcnc: 0.6 g/dL (ref 0.4–1.8)
Globulin, Total: 2.2 g/dL (ref 2.2–3.9)
IgA: <5 mg/dL — ABNORMAL LOW (ref 64–422)
IgG (Immunoglobin G), Serum: 577 mg/dL — ABNORMAL LOW (ref 586–1602)
IgM (Immunoglobulin M), Srm: <5 mg/dL — ABNORMAL LOW (ref 26–217)
Total Protein ELP: 5.9 g/dL — ABNORMAL LOW (ref 6.0–8.5)

## 2024-11-02 ENCOUNTER — Encounter: Payer: Self-pay | Admitting: Hematology

## 2024-11-07 ENCOUNTER — Other Ambulatory Visit: Payer: Self-pay

## 2024-11-07 DIAGNOSIS — C9002 Multiple myeloma in relapse: Secondary | ICD-10-CM

## 2024-11-08 ENCOUNTER — Inpatient Hospital Stay

## 2024-11-08 ENCOUNTER — Inpatient Hospital Stay: Admitting: Hematology

## 2024-11-08 ENCOUNTER — Inpatient Hospital Stay: Attending: Hematology

## 2024-11-08 VITALS — BP 118/71 | HR 78 | Temp 97.8°F | Resp 16 | Wt 101.5 lb

## 2024-11-08 DIAGNOSIS — Z95828 Presence of other vascular implants and grafts: Secondary | ICD-10-CM

## 2024-11-08 DIAGNOSIS — C9000 Multiple myeloma not having achieved remission: Secondary | ICD-10-CM

## 2024-11-08 DIAGNOSIS — Z7189 Other specified counseling: Secondary | ICD-10-CM

## 2024-11-08 DIAGNOSIS — C9002 Multiple myeloma in relapse: Secondary | ICD-10-CM

## 2024-11-08 LAB — CBC WITH DIFFERENTIAL (CANCER CENTER ONLY)
Basophils Absolute: 0 10*3/uL (ref 0.0–0.1)
Basophils Relative: 0 %
Eosinophils Absolute: 0 10*3/uL (ref 0.0–0.5)
Eosinophils Relative: 0 %
HCT: 35.4 % — ABNORMAL LOW (ref 36.0–46.0)
Hemoglobin: 11.7 g/dL — ABNORMAL LOW (ref 12.0–15.0)
Lymphocytes Relative: 5 %
Lymphs Abs: 0.3 10*3/uL — ABNORMAL LOW (ref 0.7–4.0)
MCH: 36.8 pg — ABNORMAL HIGH (ref 26.0–34.0)
MCHC: 33.1 g/dL (ref 30.0–36.0)
MCV: 111.3 fL — ABNORMAL HIGH (ref 80.0–100.0)
Monocytes Absolute: 0.2 10*3/uL (ref 0.1–1.0)
Monocytes Relative: 4 %
Neutro Abs: 5 10*3/uL (ref 1.7–7.7)
Neutrophils Relative %: 91 %
Platelet Count: 213 10*3/uL (ref 150–400)
RBC: 3.18 MIL/uL — ABNORMAL LOW (ref 3.87–5.11)
RDW: 15.9 % — ABNORMAL HIGH (ref 11.5–15.5)
Smear Review: NORMAL
WBC Count: 5.5 10*3/uL (ref 4.0–10.5)
nRBC: 0 % (ref 0.0–0.2)

## 2024-11-08 LAB — CMP (CANCER CENTER ONLY)
ALT: 252 U/L — ABNORMAL HIGH (ref 0–44)
AST: 65 U/L — ABNORMAL HIGH (ref 15–41)
Albumin: 4.4 g/dL (ref 3.5–5.0)
Alkaline Phosphatase: 35 U/L — ABNORMAL LOW (ref 38–126)
Anion gap: 9 (ref 5–15)
BUN: 22 mg/dL (ref 8–23)
CO2: 26 mmol/L (ref 22–32)
Calcium: 8.8 mg/dL — ABNORMAL LOW (ref 8.9–10.3)
Chloride: 104 mmol/L (ref 98–111)
Creatinine: 0.71 mg/dL (ref 0.44–1.00)
GFR, Estimated: >60 mL/min
Glucose, Bld: 129 mg/dL — ABNORMAL HIGH (ref 70–99)
Potassium: 4.7 mmol/L (ref 3.5–5.1)
Sodium: 139 mmol/L (ref 135–145)
Total Bilirubin: 0.4 mg/dL (ref 0.0–1.2)
Total Protein: 6 g/dL — ABNORMAL LOW (ref 6.5–8.1)

## 2024-11-08 MED ORDER — HEPARIN SOD (PORK) LOCK FLUSH 100 UNIT/ML IV SOLN: 250.0000 [IU] | Freq: Once | INTRAVENOUS | Status: DC | PRN

## 2024-11-08 MED ORDER — SODIUM CHLORIDE 0.9% FLUSH: 10.0000 mL | Freq: Once | INTRAVENOUS | Status: DC | PRN

## 2024-11-08 MED ORDER — DEXTROSE 5 % IV SOLN: INTRAVENOUS | Status: DC

## 2024-11-08 MED ORDER — ACETAMINOPHEN 325 MG PO TABS
650.0000 mg | ORAL_TABLET | Freq: Once | ORAL | Status: AC
  Administered 2024-11-08: 650 mg via ORAL
  Filled 2024-11-08: qty 2

## 2024-11-08 MED ORDER — IMMUNE GLOBULIN (HUMAN) 10 GM/100ML IV SOLN
400.0000 mg/kg | INTRAVENOUS | Status: DC
  Administered 2024-11-08: 20 g via INTRAVENOUS
  Filled 2024-11-08: qty 200

## 2024-11-08 MED ORDER — LORATADINE 10 MG PO TABS
10.0000 mg | ORAL_TABLET | Freq: Once | ORAL | Status: AC
  Administered 2024-11-08: 10 mg via ORAL
  Filled 2024-11-08: qty 1

## 2024-11-08 MED ORDER — SODIUM CHLORIDE 0.9 % IV SOLN: INTRAVENOUS | Status: DC

## 2024-11-08 NOTE — Patient Instructions (Signed)
 Immune Globulin ; Hyaluronidase Injection What is this medication? IMMUNE GLOBULIN ; HYALURONIDASE (im MUNE GLOB yoo lin; hye al ur ON i dase) treats primary immunodeficiency (PI), a condition that weakens the immune system. It works by designer, multimedia extra antibodies. Antibodies are proteins made by the immune system that help protect the body. This medicine may be used for other purposes; ask your health care provider or pharmacist if you have questions. COMMON BRAND NAME(S): HYQVIA What should I tell my care team before I take this medication? They need to know if you have any of these conditions: Dehydration Diabetes Heart disease History of blood clots Kidney disease Low IgA levels in the blood An unusual or allergic reaction to immune globulin , hyaluronidase, other blood products, foods, dyes, or preservatives Pregnant or trying to get pregnant Breastfeeding How should I use this medication? This medication is injected under the skin. It is usually given by your care team in a hospital or clinic setting. It may also be given at home. If you get this medication at home, you will be taught how to prepare and give it. Take it as directed on the prescription label. Keep taking it unless your care team tells you to stop. It is important that you put your used needles and syringes in a special sharps container. Do not put them in a trash can. If you do not have a sharps container, call your pharmacist or care team to get one. This medication comes with INSTRUCTIONS FOR USE. Ask your pharmacist for directions on how to use this medication. Read the information carefully. Talk to your pharmacist or care team if you have questions. Talk to your care team about the use of this medication in children. While it may be prescribed for children as young as 2 years for selected conditions, precautions do apply. Overdosage: If you think you have taken too much of this medicine contact a poison control  center or emergency room at once. NOTE: This medicine is only for you. Do not share this medicine with others. What if I miss a dose? If you get this medication at the hospital or clinic: It is important not to miss your dose. Call your care team if you are unable to keep an appointment. If you give yourself this medication at home: If you miss a dose, take it as soon as you can. Then continue your normal schedule. If it is almost time for your next dose, take only that dose. Do not take double or extra doses. Call your care team with questions. What may interact with this medication? Live virus vaccines This list may not describe all possible interactions. Give your health care provider a list of all the medicines, herbs, non-prescription drugs, or dietary supplements you use. Also tell them if you smoke, drink alcohol, or use illegal drugs. Some items may interact with your medicine. What should I watch for while using this medication? Visit your care team for regular checks on your progress. Tell your care team if your symptoms do not start to get better or if they get worse. You may need blood work while taking this medication. Call your care team if you are around anyone with measles or if you develop sores or blisters that do not heal properly. This medication is made from human blood. It may be possible to pass an infection in this medication. Talk to your care team about the risks and benefits of this medication. This medication can decrease the response to  a vaccine. If you need to get vaccinated, tell your care team if you have received this medication. Extra booster doses may be needed Talk to your care team to see if a different vaccination schedule is needed. What side effects may I notice from receiving this medication? Side effects that you should report to your care team as soon as possible: Allergic reactions--skin rash, itching, hives, swelling of the face, lips, tongue, or  throat Blood clot--pain, swelling, or warmth in the leg, shortness of breath, chest pain Fever, neck pain or stiffness, sensitivity to light, headache, nausea, vomiting, confusion, which may be signs of meningitis Kidney injury--decrease in the amount of urine, swelling of the ankles, hands, or feet Lung injury--shortness of breath or trouble breathing, cough, spitting up blood, chest pain, fever Side effects that usually do not require medical attention (report these to your care team if they continue or are bothersome): Fatigue Fever Nausea Vomiting This list may not describe all possible side effects. Call your doctor for medical advice about side effects. You may report side effects to FDA at 1-800-FDA-1088. Where should I keep my medication? Keep out of the reach of children and pets. If you are using this medication at home, you will be instructed on how to store this medication. Get rid of any unused medication after the expiration date. To get rid of medications that are no longer needed or have expired: Take the medication to a medication take-back program. Check with your pharmacy or law enforcement to find a location. If you cannot return the medication, ask your pharmacist or care team how to get rid of this medication safely. NOTE: This sheet is a summary. It may not cover all possible information. If you have questions about this medicine, talk to your doctor, pharmacist, or health care provider.  2025 [employer] (2024-05-03 00:00:00)

## 2024-11-10 ENCOUNTER — Inpatient Hospital Stay

## 2024-11-22 ENCOUNTER — Inpatient Hospital Stay

## 2024-11-22 ENCOUNTER — Inpatient Hospital Stay: Admitting: Hematology

## 2024-12-06 ENCOUNTER — Inpatient Hospital Stay

## 2024-12-20 ENCOUNTER — Inpatient Hospital Stay

## 2024-12-20 ENCOUNTER — Inpatient Hospital Stay: Admitting: Hematology

## 2025-01-26 ENCOUNTER — Ambulatory Visit: Admitting: Internal Medicine

## 2025-02-26 ENCOUNTER — Ambulatory Visit

## 2025-06-14 ENCOUNTER — Encounter: Admitting: Family Medicine
